# Patient Record
Sex: Female | Born: 1950
Health system: Southern US, Community
[De-identification: ages and names within clinical notes are randomized; demographics above are authoritative.]

## PROBLEM LIST (undated history)

## (undated) DIAGNOSIS — K5730 Diverticulosis of large intestine without perforation or abscess without bleeding: Secondary | ICD-10-CM

## (undated) DIAGNOSIS — N289 Disorder of kidney and ureter, unspecified: Secondary | ICD-10-CM

## (undated) DIAGNOSIS — I890 Lymphedema, not elsewhere classified: Secondary | ICD-10-CM

## (undated) DIAGNOSIS — Z794 Long term (current) use of insulin: Secondary | ICD-10-CM

## (undated) DIAGNOSIS — E785 Hyperlipidemia, unspecified: Secondary | ICD-10-CM

## (undated) DIAGNOSIS — M1990 Unspecified osteoarthritis, unspecified site: Secondary | ICD-10-CM

## (undated) DIAGNOSIS — M25551 Pain in right hip: Secondary | ICD-10-CM

## (undated) DIAGNOSIS — E039 Hypothyroidism, unspecified: Secondary | ICD-10-CM

## (undated) DIAGNOSIS — R3914 Feeling of incomplete bladder emptying: Secondary | ICD-10-CM

## (undated) DIAGNOSIS — F32A Depression, unspecified: Secondary | ICD-10-CM

## (undated) DIAGNOSIS — H3530 Unspecified macular degeneration: Secondary | ICD-10-CM

## (undated) DIAGNOSIS — Z87898 Personal history of other specified conditions: Secondary | ICD-10-CM

## (undated) DIAGNOSIS — Z853 Personal history of malignant neoplasm of breast: Secondary | ICD-10-CM

## (undated) DIAGNOSIS — I10 Essential (primary) hypertension: Secondary | ICD-10-CM

## (undated) DIAGNOSIS — R296 Repeated falls: Secondary | ICD-10-CM

## (undated) DIAGNOSIS — E1142 Type 2 diabetes mellitus with diabetic polyneuropathy: Secondary | ICD-10-CM

## (undated) DIAGNOSIS — E876 Hypokalemia: Secondary | ICD-10-CM

## (undated) DIAGNOSIS — F329 Major depressive disorder, single episode, unspecified: Secondary | ICD-10-CM

## (undated) DIAGNOSIS — H40003 Preglaucoma, unspecified, bilateral: Secondary | ICD-10-CM

## (undated) DIAGNOSIS — R3915 Urgency of urination: Secondary | ICD-10-CM

## (undated) DIAGNOSIS — E119 Type 2 diabetes mellitus without complications: Secondary | ICD-10-CM

## (undated) DIAGNOSIS — N1330 Unspecified hydronephrosis: Secondary | ICD-10-CM

## (undated) DIAGNOSIS — H04123 Dry eye syndrome of bilateral lacrimal glands: Secondary | ICD-10-CM

## (undated) DIAGNOSIS — R42 Dizziness and giddiness: Secondary | ICD-10-CM

## (undated) HISTORY — PX: FINGER SURGERY: SHX640

## (undated) HISTORY — DX: Essential (primary) hypertension: I10

## (undated) HISTORY — PX: TUBAL LIGATION: SHX77

## (undated) HISTORY — DX: Major depressive disorder, single episode, unspecified: F32.9

## (undated) HISTORY — DX: Unspecified osteoarthritis, unspecified site: M19.90

## (undated) HISTORY — DX: Hyperlipidemia, unspecified: E78.5

## (undated) HISTORY — DX: Depression, unspecified: F32.A

## (undated) HISTORY — PX: VAGINAL HYSTERECTOMY: SUR661

---
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
## 1991-04-16 DIAGNOSIS — E1142 Type 2 diabetes mellitus with diabetic polyneuropathy: Secondary | ICD-10-CM | POA: Insufficient documentation

## 1991-04-16 DIAGNOSIS — Z794 Long term (current) use of insulin: Secondary | ICD-10-CM

## 1997-01-28 HISTORY — PX: MASTECTOMY: SHX3

## 1997-05-02 ENCOUNTER — Other Ambulatory Visit: Admission: RE | Admit: 1997-05-02 | Discharge: 1997-05-02 | Payer: Self-pay | Admitting: General Surgery

## 1997-05-16 ENCOUNTER — Other Ambulatory Visit: Admission: RE | Admit: 1997-05-16 | Discharge: 1997-05-16 | Payer: Self-pay | Admitting: General Surgery

## 1997-05-16 ENCOUNTER — Other Ambulatory Visit: Admission: RE | Admit: 1997-05-16 | Discharge: 1997-05-16 | Payer: Self-pay | Admitting: Family Medicine

## 1997-07-29 ENCOUNTER — Ambulatory Visit (HOSPITAL_COMMUNITY): Admission: RE | Admit: 1997-07-29 | Discharge: 1997-07-29 | Payer: Self-pay | Admitting: *Deleted

## 1997-08-22 ENCOUNTER — Ambulatory Visit (HOSPITAL_COMMUNITY): Admission: RE | Admit: 1997-08-22 | Discharge: 1997-08-23 | Payer: Self-pay | Admitting: *Deleted

## 1997-08-29 ENCOUNTER — Ambulatory Visit (HOSPITAL_COMMUNITY): Admission: RE | Admit: 1997-08-29 | Discharge: 1997-08-29 | Payer: Self-pay | Admitting: *Deleted

## 1997-12-06 ENCOUNTER — Encounter: Admission: RE | Admit: 1997-12-06 | Discharge: 1998-03-06 | Payer: Self-pay | Admitting: *Deleted

## 1997-12-16 ENCOUNTER — Encounter: Admission: RE | Admit: 1997-12-16 | Discharge: 1998-02-02 | Payer: Self-pay | Admitting: *Deleted

## 1998-06-25 ENCOUNTER — Emergency Department (HOSPITAL_COMMUNITY): Admission: EM | Admit: 1998-06-25 | Discharge: 1998-06-25 | Payer: Self-pay | Admitting: Emergency Medicine

## 1998-10-05 ENCOUNTER — Ambulatory Visit: Admission: RE | Admit: 1998-10-05 | Discharge: 1998-10-05 | Payer: Self-pay | Admitting: Oncology

## 1999-01-17 ENCOUNTER — Encounter: Payer: Self-pay | Admitting: Oncology

## 1999-01-17 ENCOUNTER — Encounter: Admission: RE | Admit: 1999-01-17 | Discharge: 1999-01-17 | Payer: Self-pay | Admitting: Oncology

## 1999-03-20 ENCOUNTER — Encounter: Admission: RE | Admit: 1999-03-20 | Discharge: 1999-06-18 | Payer: Self-pay | Admitting: *Deleted

## 1999-03-26 ENCOUNTER — Encounter: Admission: RE | Admit: 1999-03-26 | Discharge: 1999-03-26 | Payer: Self-pay | Admitting: *Deleted

## 1999-03-27 ENCOUNTER — Encounter: Admission: RE | Admit: 1999-03-27 | Discharge: 1999-06-25 | Payer: Self-pay | Admitting: *Deleted

## 1999-04-24 ENCOUNTER — Emergency Department (HOSPITAL_COMMUNITY): Admission: EM | Admit: 1999-04-24 | Discharge: 1999-04-24 | Payer: Self-pay | Admitting: Emergency Medicine

## 1999-05-01 HISTORY — PX: PORT-A-CATH REMOVAL: SHX5289

## 1999-05-02 ENCOUNTER — Ambulatory Visit (HOSPITAL_BASED_OUTPATIENT_CLINIC_OR_DEPARTMENT_OTHER): Admission: RE | Admit: 1999-05-02 | Discharge: 1999-05-02 | Payer: Self-pay | Admitting: *Deleted

## 1999-05-29 ENCOUNTER — Encounter: Admission: RE | Admit: 1999-05-29 | Discharge: 1999-05-29 | Payer: Self-pay | Admitting: Oncology

## 1999-05-29 ENCOUNTER — Encounter: Payer: Self-pay | Admitting: Oncology

## 1999-07-02 ENCOUNTER — Encounter: Admission: RE | Admit: 1999-07-02 | Discharge: 1999-07-02 | Payer: Self-pay | Admitting: *Deleted

## 1999-08-10 ENCOUNTER — Encounter: Admission: RE | Admit: 1999-08-10 | Discharge: 1999-08-10 | Payer: Self-pay | Admitting: Family Medicine

## 1999-08-10 ENCOUNTER — Encounter: Payer: Self-pay | Admitting: Oncology

## 1999-11-13 ENCOUNTER — Emergency Department (HOSPITAL_COMMUNITY): Admission: EM | Admit: 1999-11-13 | Discharge: 1999-11-13 | Payer: Self-pay | Admitting: Emergency Medicine

## 2000-06-04 ENCOUNTER — Encounter: Payer: Self-pay | Admitting: Oncology

## 2000-06-04 ENCOUNTER — Encounter: Admission: RE | Admit: 2000-06-04 | Discharge: 2000-06-04 | Payer: Self-pay | Admitting: Oncology

## 2000-08-18 ENCOUNTER — Emergency Department (HOSPITAL_COMMUNITY): Admission: EM | Admit: 2000-08-18 | Discharge: 2000-08-18 | Payer: Self-pay | Admitting: Emergency Medicine

## 2000-08-18 ENCOUNTER — Encounter: Payer: Self-pay | Admitting: Emergency Medicine

## 2000-08-20 ENCOUNTER — Emergency Department (HOSPITAL_COMMUNITY): Admission: EM | Admit: 2000-08-20 | Discharge: 2000-08-20 | Payer: Self-pay | Admitting: Emergency Medicine

## 2000-08-20 ENCOUNTER — Encounter: Payer: Self-pay | Admitting: Emergency Medicine

## 2000-09-16 ENCOUNTER — Encounter: Payer: Self-pay | Admitting: Urology

## 2000-09-18 ENCOUNTER — Ambulatory Visit (HOSPITAL_COMMUNITY): Admission: RE | Admit: 2000-09-18 | Discharge: 2000-09-18 | Payer: Self-pay | Admitting: Urology

## 2000-09-18 HISTORY — PX: OTHER SURGICAL HISTORY: SHX169

## 2001-03-03 ENCOUNTER — Ambulatory Visit (HOSPITAL_COMMUNITY): Admission: RE | Admit: 2001-03-03 | Discharge: 2001-03-03 | Payer: Self-pay | Admitting: Oncology

## 2001-03-03 ENCOUNTER — Encounter: Payer: Self-pay | Admitting: Oncology

## 2001-06-08 ENCOUNTER — Encounter: Payer: Self-pay | Admitting: Oncology

## 2001-06-08 ENCOUNTER — Encounter: Admission: RE | Admit: 2001-06-08 | Discharge: 2001-06-08 | Payer: Self-pay | Admitting: Oncology

## 2001-10-06 ENCOUNTER — Other Ambulatory Visit: Admission: RE | Admit: 2001-10-06 | Discharge: 2001-10-06 | Payer: Self-pay | Admitting: Family Medicine

## 2001-10-08 ENCOUNTER — Encounter: Payer: Self-pay | Admitting: Family Medicine

## 2001-10-08 ENCOUNTER — Ambulatory Visit (HOSPITAL_COMMUNITY): Admission: RE | Admit: 2001-10-08 | Discharge: 2001-10-08 | Payer: Self-pay | Admitting: Family Medicine

## 2002-06-14 ENCOUNTER — Encounter: Payer: Self-pay | Admitting: Oncology

## 2002-06-14 ENCOUNTER — Encounter: Admission: RE | Admit: 2002-06-14 | Discharge: 2002-06-14 | Payer: Self-pay | Admitting: Oncology

## 2002-10-12 ENCOUNTER — Encounter: Payer: Self-pay | Admitting: Family Medicine

## 2002-10-12 ENCOUNTER — Ambulatory Visit (HOSPITAL_COMMUNITY): Admission: RE | Admit: 2002-10-12 | Discharge: 2002-10-12 | Payer: Self-pay | Admitting: Family Medicine

## 2002-10-18 ENCOUNTER — Ambulatory Visit (HOSPITAL_COMMUNITY): Admission: RE | Admit: 2002-10-18 | Discharge: 2002-10-18 | Payer: Self-pay | Admitting: Gastroenterology

## 2003-06-15 ENCOUNTER — Encounter: Admission: RE | Admit: 2003-06-15 | Discharge: 2003-06-15 | Payer: Self-pay | Admitting: Oncology

## 2003-10-07 ENCOUNTER — Ambulatory Visit: Payer: Self-pay | Admitting: Family Medicine

## 2003-10-13 DIAGNOSIS — E785 Hyperlipidemia, unspecified: Secondary | ICD-10-CM | POA: Insufficient documentation

## 2003-11-01 ENCOUNTER — Ambulatory Visit (HOSPITAL_COMMUNITY): Admission: RE | Admit: 2003-11-01 | Discharge: 2003-11-01 | Payer: Self-pay | Admitting: Family Medicine

## 2003-11-28 ENCOUNTER — Ambulatory Visit: Payer: Self-pay | Admitting: Family Medicine

## 2004-01-06 ENCOUNTER — Ambulatory Visit: Payer: Self-pay | Admitting: Family Medicine

## 2004-01-18 ENCOUNTER — Ambulatory Visit: Payer: Self-pay | Admitting: Family Medicine

## 2004-02-23 ENCOUNTER — Ambulatory Visit: Payer: Self-pay | Admitting: Family Medicine

## 2004-04-25 ENCOUNTER — Ambulatory Visit: Payer: Self-pay | Admitting: Internal Medicine

## 2004-04-27 ENCOUNTER — Ambulatory Visit: Payer: Self-pay | Admitting: Family Medicine

## 2004-05-15 ENCOUNTER — Ambulatory Visit (HOSPITAL_COMMUNITY): Admission: RE | Admit: 2004-05-15 | Discharge: 2004-05-15 | Payer: Self-pay | Admitting: Family Medicine

## 2004-06-18 ENCOUNTER — Ambulatory Visit: Payer: Self-pay | Admitting: Oncology

## 2004-06-28 ENCOUNTER — Encounter (INDEPENDENT_AMBULATORY_CARE_PROVIDER_SITE_OTHER): Payer: Self-pay | Admitting: Family Medicine

## 2004-06-28 LAB — CONVERTED CEMR LAB: Pap Smear: NORMAL

## 2004-07-04 ENCOUNTER — Encounter: Admission: RE | Admit: 2004-07-04 | Discharge: 2004-07-04 | Payer: Self-pay | Admitting: Oncology

## 2004-07-12 ENCOUNTER — Encounter: Admission: RE | Admit: 2004-07-12 | Discharge: 2004-07-12 | Payer: Self-pay | Admitting: Oncology

## 2004-08-01 ENCOUNTER — Ambulatory Visit: Payer: Self-pay | Admitting: Family Medicine

## 2004-10-03 ENCOUNTER — Ambulatory Visit: Payer: Self-pay | Admitting: Family Medicine

## 2004-12-07 ENCOUNTER — Ambulatory Visit: Payer: Self-pay | Admitting: Family Medicine

## 2005-02-12 ENCOUNTER — Ambulatory Visit: Payer: Self-pay | Admitting: Family Medicine

## 2005-04-08 ENCOUNTER — Ambulatory Visit: Payer: Self-pay | Admitting: Family Medicine

## 2005-07-12 ENCOUNTER — Encounter: Admission: RE | Admit: 2005-07-12 | Discharge: 2005-07-12 | Payer: Self-pay | Admitting: Oncology

## 2005-07-16 ENCOUNTER — Ambulatory Visit: Payer: Self-pay | Admitting: Oncology

## 2005-07-19 LAB — COMPREHENSIVE METABOLIC PANEL
ALT: 9 U/L (ref 0–40)
AST: 12 U/L (ref 0–37)
Albumin: 4.5 g/dL (ref 3.5–5.2)
Alkaline Phosphatase: 71 U/L (ref 39–117)
BUN: 25 mg/dL — ABNORMAL HIGH (ref 6–23)
CO2: 23 mEq/L (ref 19–32)
Calcium: 9.2 mg/dL (ref 8.4–10.5)
Chloride: 101 mEq/L (ref 96–112)
Creatinine, Ser: 1.14 mg/dL (ref 0.40–1.20)
Glucose, Bld: 199 mg/dL — ABNORMAL HIGH (ref 70–99)
Potassium: 3.1 mEq/L — ABNORMAL LOW (ref 3.5–5.3)
Sodium: 139 mEq/L (ref 135–145)
Total Bilirubin: 0.4 mg/dL (ref 0.3–1.2)
Total Protein: 7.3 g/dL (ref 6.0–8.3)

## 2005-07-19 LAB — CBC WITH DIFFERENTIAL/PLATELET
BASO%: 0.3 % (ref 0.0–2.0)
Basophils Absolute: 0 10*3/uL (ref 0.0–0.1)
EOS%: 1.3 % (ref 0.0–7.0)
Eosinophils Absolute: 0.1 10*3/uL (ref 0.0–0.5)
HCT: 34.6 % — ABNORMAL LOW (ref 34.8–46.6)
HGB: 11.7 g/dL (ref 11.6–15.9)
LYMPH%: 15.6 % (ref 14.0–48.0)
MCH: 30.9 pg (ref 26.0–34.0)
MCHC: 33.8 g/dL (ref 32.0–36.0)
MCV: 91.4 fL (ref 81.0–101.0)
MONO#: 0.4 10*3/uL (ref 0.1–0.9)
MONO%: 5 % (ref 0.0–13.0)
NEUT#: 6.8 10*3/uL — ABNORMAL HIGH (ref 1.5–6.5)
NEUT%: 77.8 % — ABNORMAL HIGH (ref 39.6–76.8)
Platelets: 291 10*3/uL (ref 145–400)
RBC: 3.79 10*6/uL (ref 3.70–5.32)
RDW: 12.5 % (ref 11.3–14.5)
WBC: 8.7 10*3/uL (ref 3.9–10.0)
lymph#: 1.4 10*3/uL (ref 0.9–3.3)

## 2005-07-19 LAB — LACTATE DEHYDROGENASE: LDH: 127 U/L (ref 94–250)

## 2005-07-23 ENCOUNTER — Ambulatory Visit: Payer: Self-pay | Admitting: Family Medicine

## 2005-08-09 LAB — BASIC METABOLIC PANEL
BUN: 20 mg/dL (ref 6–23)
CO2: 26 mEq/L (ref 19–32)
Calcium: 9.3 mg/dL (ref 8.4–10.5)
Chloride: 95 mEq/L — ABNORMAL LOW (ref 96–112)
Creatinine, Ser: 1.03 mg/dL (ref 0.40–1.20)
Glucose, Bld: 318 mg/dL — ABNORMAL HIGH (ref 70–99)
Potassium: 3.3 mEq/L — ABNORMAL LOW (ref 3.5–5.3)
Sodium: 135 mEq/L (ref 135–145)

## 2005-10-22 ENCOUNTER — Ambulatory Visit: Payer: Self-pay | Admitting: Family Medicine

## 2005-12-30 ENCOUNTER — Ambulatory Visit: Payer: Self-pay | Admitting: Internal Medicine

## 2006-01-23 ENCOUNTER — Ambulatory Visit: Payer: Self-pay | Admitting: Family Medicine

## 2006-04-16 ENCOUNTER — Encounter (INDEPENDENT_AMBULATORY_CARE_PROVIDER_SITE_OTHER): Payer: Self-pay | Admitting: Family Medicine

## 2006-04-16 DIAGNOSIS — M545 Low back pain, unspecified: Secondary | ICD-10-CM | POA: Insufficient documentation

## 2006-04-16 DIAGNOSIS — E039 Hypothyroidism, unspecified: Secondary | ICD-10-CM | POA: Insufficient documentation

## 2006-04-16 DIAGNOSIS — M1990 Unspecified osteoarthritis, unspecified site: Secondary | ICD-10-CM | POA: Insufficient documentation

## 2006-04-16 DIAGNOSIS — Z853 Personal history of malignant neoplasm of breast: Secondary | ICD-10-CM | POA: Insufficient documentation

## 2006-04-16 DIAGNOSIS — F32A Depression, unspecified: Secondary | ICD-10-CM | POA: Insufficient documentation

## 2006-04-16 DIAGNOSIS — F329 Major depressive disorder, single episode, unspecified: Secondary | ICD-10-CM | POA: Insufficient documentation

## 2006-04-16 DIAGNOSIS — D649 Anemia, unspecified: Secondary | ICD-10-CM | POA: Insufficient documentation

## 2006-04-16 HISTORY — DX: Low back pain, unspecified: M54.50

## 2006-04-24 ENCOUNTER — Ambulatory Visit: Payer: Self-pay | Admitting: Family Medicine

## 2006-05-21 ENCOUNTER — Ambulatory Visit: Payer: Self-pay | Admitting: Family Medicine

## 2006-05-27 ENCOUNTER — Emergency Department (HOSPITAL_COMMUNITY): Admission: EM | Admit: 2006-05-27 | Discharge: 2006-05-27 | Payer: Self-pay | Admitting: Emergency Medicine

## 2006-06-10 ENCOUNTER — Ambulatory Visit: Payer: Self-pay | Admitting: Family Medicine

## 2006-07-09 ENCOUNTER — Ambulatory Visit: Payer: Self-pay | Admitting: Oncology

## 2006-07-11 LAB — CBC WITH DIFFERENTIAL/PLATELET
BASO%: 0.7 % (ref 0.0–2.0)
Basophils Absolute: 0.1 10*3/uL (ref 0.0–0.1)
EOS%: 6.1 % (ref 0.0–7.0)
Eosinophils Absolute: 0.4 10*3/uL (ref 0.0–0.5)
HCT: 31.1 % — ABNORMAL LOW (ref 34.8–46.6)
HGB: 11.2 g/dL — ABNORMAL LOW (ref 11.6–15.9)
LYMPH%: 17.6 % (ref 14.0–48.0)
MCH: 31.5 pg (ref 26.0–34.0)
MCHC: 36.1 g/dL — ABNORMAL HIGH (ref 32.0–36.0)
MCV: 87.4 fL (ref 81.0–101.0)
MONO#: 0.6 10*3/uL (ref 0.1–0.9)
MONO%: 7.7 % (ref 0.0–13.0)
NEUT#: 5 10*3/uL (ref 1.5–6.5)
NEUT%: 67.8 % (ref 39.6–76.8)
Platelets: 282 10*3/uL (ref 145–400)
RBC: 3.55 10*6/uL — ABNORMAL LOW (ref 3.70–5.32)
RDW: 10.7 % — ABNORMAL LOW (ref 11.3–14.5)
WBC: 7.3 10*3/uL (ref 3.9–10.0)
lymph#: 1.3 10*3/uL (ref 0.9–3.3)

## 2006-07-11 LAB — COMPREHENSIVE METABOLIC PANEL
ALT: 9 U/L (ref 0–35)
AST: 15 U/L (ref 0–37)
Albumin: 4.2 g/dL (ref 3.5–5.2)
Alkaline Phosphatase: 66 U/L (ref 39–117)
BUN: 20 mg/dL (ref 6–23)
CO2: 23 mEq/L (ref 19–32)
Calcium: 9 mg/dL (ref 8.4–10.5)
Chloride: 105 mEq/L (ref 96–112)
Creatinine, Ser: 1.51 mg/dL — ABNORMAL HIGH (ref 0.40–1.20)
Glucose, Bld: 167 mg/dL — ABNORMAL HIGH (ref 70–99)
Potassium: 3.7 mEq/L (ref 3.5–5.3)
Sodium: 139 mEq/L (ref 135–145)
Total Bilirubin: 0.3 mg/dL (ref 0.3–1.2)
Total Protein: 7 g/dL (ref 6.0–8.3)

## 2006-07-11 LAB — LACTATE DEHYDROGENASE: LDH: 132 U/L (ref 94–250)

## 2006-07-14 ENCOUNTER — Encounter: Admission: RE | Admit: 2006-07-14 | Discharge: 2006-07-14 | Payer: Self-pay | Admitting: Oncology

## 2006-07-25 ENCOUNTER — Ambulatory Visit: Payer: Self-pay | Admitting: Family Medicine

## 2006-10-08 ENCOUNTER — Observation Stay (HOSPITAL_COMMUNITY): Admission: EM | Admit: 2006-10-08 | Discharge: 2006-10-09 | Payer: Self-pay | Admitting: Emergency Medicine

## 2006-11-07 ENCOUNTER — Ambulatory Visit: Payer: Self-pay | Admitting: Family Medicine

## 2006-12-16 ENCOUNTER — Ambulatory Visit: Payer: Self-pay | Admitting: Family Medicine

## 2006-12-16 LAB — CONVERTED CEMR LAB
ALT: 11 units/L (ref 0–35)
AST: 20 units/L (ref 0–37)
Albumin: 4.3 g/dL (ref 3.5–5.2)
Alkaline Phosphatase: 64 units/L (ref 39–117)
BUN: 28 mg/dL — ABNORMAL HIGH (ref 6–23)
CO2: 21 meq/L (ref 19–32)
Calcium: 9.2 mg/dL (ref 8.4–10.5)
Chloride: 105 meq/L (ref 96–112)
Cholesterol: 135 mg/dL (ref 0–200)
Creatinine, Ser: 1.34 mg/dL — ABNORMAL HIGH (ref 0.40–1.20)
Glucose, Bld: 87 mg/dL (ref 70–99)
HDL: 31 mg/dL — ABNORMAL LOW (ref >39)
LDL Cholesterol: 87 mg/dL (ref 0–99)
Potassium: 3.6 meq/L (ref 3.5–5.3)
Sodium: 142 meq/L (ref 135–145)
TSH: 0.164 microintl units/mL — ABNORMAL LOW (ref 0.350–5.50)
Total Bilirubin: 0.4 mg/dL (ref 0.3–1.2)
Total CHOL/HDL Ratio: 4.4
Total Protein: 7.1 g/dL (ref 6.0–8.3)
Triglycerides: 85 mg/dL (ref <150)
VLDL: 17 mg/dL (ref 0–40)

## 2007-01-07 ENCOUNTER — Ambulatory Visit: Payer: Self-pay | Admitting: Family Medicine

## 2007-02-18 ENCOUNTER — Ambulatory Visit: Payer: Self-pay | Admitting: Family Medicine

## 2007-02-18 LAB — CONVERTED CEMR LAB
Free T4: 0.92 ng/dL (ref 0.89–1.80)
TSH: 11.429 microintl units/mL — ABNORMAL HIGH (ref 0.350–5.50)

## 2007-03-19 ENCOUNTER — Ambulatory Visit: Payer: Self-pay | Admitting: Family Medicine

## 2007-04-20 ENCOUNTER — Ambulatory Visit: Payer: Self-pay | Admitting: Family Medicine

## 2007-04-20 LAB — CONVERTED CEMR LAB
BUN: 25 mg/dL — ABNORMAL HIGH (ref 6–23)
CO2: 23 meq/L (ref 19–32)
Calcium: 9.9 mg/dL (ref 8.4–10.5)
Chloride: 100 meq/L (ref 96–112)
Creatinine, Ser: 1.16 mg/dL (ref 0.40–1.20)
Free T4: 0.96 ng/dL (ref 0.89–1.80)
Glucose, Bld: 354 mg/dL — ABNORMAL HIGH (ref 70–99)
Potassium: 3.9 meq/L (ref 3.5–5.3)
Sodium: 139 meq/L (ref 135–145)
TSH: 6.342 microintl units/mL — ABNORMAL HIGH (ref 0.350–5.50)

## 2007-06-16 ENCOUNTER — Ambulatory Visit: Payer: Self-pay | Admitting: Family Medicine

## 2007-06-16 LAB — CONVERTED CEMR LAB: Microalb, Ur: 2.22 mg/dL — ABNORMAL HIGH (ref 0.00–1.89)

## 2007-07-08 ENCOUNTER — Ambulatory Visit: Payer: Self-pay | Admitting: Oncology

## 2007-07-13 LAB — COMPREHENSIVE METABOLIC PANEL
ALT: 9 U/L (ref 0–35)
AST: 12 U/L (ref 0–37)
Albumin: 4.1 g/dL (ref 3.5–5.2)
Alkaline Phosphatase: 62 U/L (ref 39–117)
BUN: 17 mg/dL (ref 6–23)
CO2: 22 mEq/L (ref 19–32)
Calcium: 10 mg/dL (ref 8.4–10.5)
Chloride: 105 mEq/L (ref 96–112)
Creatinine, Ser: 1.21 mg/dL — ABNORMAL HIGH (ref 0.40–1.20)
Glucose, Bld: 240 mg/dL — ABNORMAL HIGH (ref 70–99)
Potassium: 3.5 mEq/L (ref 3.5–5.3)
Sodium: 141 mEq/L (ref 135–145)
Total Bilirubin: 0.3 mg/dL (ref 0.3–1.2)
Total Protein: 7 g/dL (ref 6.0–8.3)

## 2007-07-13 LAB — CBC WITH DIFFERENTIAL/PLATELET
BASO%: 0.4 % (ref 0.0–2.0)
Basophils Absolute: 0 10*3/uL (ref 0.0–0.1)
EOS%: 2.2 % (ref 0.0–7.0)
Eosinophils Absolute: 0.2 10*3/uL (ref 0.0–0.5)
HCT: 35.2 % (ref 34.8–46.6)
HGB: 12 g/dL (ref 11.6–15.9)
LYMPH%: 18.7 % (ref 14.0–48.0)
MCH: 30.9 pg (ref 26.0–34.0)
MCHC: 34.1 g/dL (ref 32.0–36.0)
MCV: 90.5 fL (ref 81.0–101.0)
MONO#: 0.5 10*3/uL (ref 0.1–0.9)
MONO%: 5.1 % (ref 0.0–13.0)
NEUT#: 6.7 10*3/uL — ABNORMAL HIGH (ref 1.5–6.5)
NEUT%: 73.6 % (ref 39.6–76.8)
Platelets: 276 10*3/uL (ref 145–400)
RBC: 3.89 10*6/uL (ref 3.70–5.32)
RDW: 13.1 % (ref 11.3–14.5)
WBC: 9.1 10*3/uL (ref 3.9–10.0)
lymph#: 1.7 10*3/uL (ref 0.9–3.3)

## 2007-07-13 LAB — LACTATE DEHYDROGENASE: LDH: 108 U/L (ref 94–250)

## 2007-07-16 ENCOUNTER — Encounter: Admission: RE | Admit: 2007-07-16 | Discharge: 2007-07-16 | Payer: Self-pay | Admitting: Oncology

## 2007-07-24 ENCOUNTER — Ambulatory Visit (HOSPITAL_COMMUNITY): Admission: RE | Admit: 2007-07-24 | Discharge: 2007-07-24 | Payer: Self-pay | Admitting: Oncology

## 2007-08-04 ENCOUNTER — Ambulatory Visit (HOSPITAL_COMMUNITY): Admission: RE | Admit: 2007-08-04 | Discharge: 2007-08-04 | Payer: Self-pay | Admitting: Oncology

## 2007-08-05 ENCOUNTER — Encounter (INDEPENDENT_AMBULATORY_CARE_PROVIDER_SITE_OTHER): Payer: Self-pay | Admitting: Family Medicine

## 2007-08-05 ENCOUNTER — Ambulatory Visit: Payer: Self-pay | Admitting: Internal Medicine

## 2007-08-05 LAB — CONVERTED CEMR LAB: TSH: 3.97 microintl units/mL (ref 0.350–4.50)

## 2007-08-14 ENCOUNTER — Ambulatory Visit: Payer: Self-pay | Admitting: Oncology

## 2007-09-22 ENCOUNTER — Ambulatory Visit: Payer: Self-pay | Admitting: Internal Medicine

## 2007-09-23 ENCOUNTER — Encounter (INDEPENDENT_AMBULATORY_CARE_PROVIDER_SITE_OTHER): Payer: Self-pay | Admitting: Family Medicine

## 2007-09-23 LAB — CONVERTED CEMR LAB
BUN: 25 mg/dL — ABNORMAL HIGH (ref 6–23)
Basophils Absolute: 0 10*3/uL (ref 0.0–0.1)
Basophils Relative: 0 % (ref 0–1)
CO2: 22 meq/L (ref 19–32)
Calcium: 10 mg/dL (ref 8.4–10.5)
Chloride: 105 meq/L (ref 96–112)
Cholesterol: 143 mg/dL (ref 0–200)
Creatinine, Ser: 1.27 mg/dL — ABNORMAL HIGH (ref 0.40–1.20)
Eosinophils Absolute: 0.2 10*3/uL (ref 0.0–0.7)
Eosinophils Relative: 2 % (ref 0–5)
Glucose, Bld: 162 mg/dL — ABNORMAL HIGH (ref 70–99)
HCT: 35.8 % — ABNORMAL LOW (ref 36.0–46.0)
HDL: 32 mg/dL — ABNORMAL LOW (ref >39)
Hemoglobin: 12.1 g/dL (ref 12.0–15.0)
LDL Cholesterol: 69 mg/dL (ref 0–99)
Lymphocytes Relative: 22 % (ref 12–46)
Lymphs Abs: 1.9 10*3/uL (ref 0.7–4.0)
MCHC: 33.8 g/dL (ref 30.0–36.0)
MCV: 88.8 fL (ref 78.0–100.0)
Monocytes Absolute: 0.4 10*3/uL (ref 0.1–1.0)
Monocytes Relative: 5 % (ref 3–12)
Neutro Abs: 6.3 10*3/uL (ref 1.7–7.7)
Neutrophils Relative %: 72 % (ref 43–77)
Platelets: 272 10*3/uL (ref 150–400)
Potassium: 3.5 meq/L (ref 3.5–5.3)
RBC: 4.03 M/uL (ref 3.87–5.11)
RDW: 12.9 % (ref 11.5–15.5)
Sodium: 139 meq/L (ref 135–145)
Total CHOL/HDL Ratio: 4.5
Triglycerides: 211 mg/dL — ABNORMAL HIGH (ref <150)
VLDL: 42 mg/dL — ABNORMAL HIGH (ref 0–40)
Vit D, 1,25-Dihydroxy: 21 — ABNORMAL LOW (ref 30–89)
WBC: 8.8 10*3/uL (ref 4.0–10.5)

## 2007-11-03 ENCOUNTER — Encounter (HOSPITAL_COMMUNITY): Admission: RE | Admit: 2007-11-03 | Discharge: 2008-01-28 | Payer: Self-pay | Admitting: Oncology

## 2007-12-18 ENCOUNTER — Ambulatory Visit: Payer: Self-pay | Admitting: Oncology

## 2008-01-14 ENCOUNTER — Ambulatory Visit: Payer: Self-pay | Admitting: Family Medicine

## 2008-02-25 ENCOUNTER — Ambulatory Visit: Payer: Self-pay | Admitting: Family Medicine

## 2008-03-21 ENCOUNTER — Ambulatory Visit: Payer: Self-pay | Admitting: Internal Medicine

## 2008-04-28 ENCOUNTER — Ambulatory Visit: Payer: Self-pay | Admitting: Family Medicine

## 2008-05-06 ENCOUNTER — Ambulatory Visit (HOSPITAL_COMMUNITY): Admission: RE | Admit: 2008-05-06 | Discharge: 2008-05-06 | Payer: Self-pay | Admitting: Family Medicine

## 2008-07-14 ENCOUNTER — Ambulatory Visit: Payer: Self-pay | Admitting: Oncology

## 2008-07-18 ENCOUNTER — Encounter: Admission: RE | Admit: 2008-07-18 | Discharge: 2008-07-18 | Payer: Self-pay | Admitting: Oncology

## 2008-07-18 LAB — COMPREHENSIVE METABOLIC PANEL
ALT: 8 U/L (ref 0–35)
AST: 12 U/L (ref 0–37)
Albumin: 4 g/dL (ref 3.5–5.2)
Alkaline Phosphatase: 61 U/L (ref 39–117)
BUN: 25 mg/dL — ABNORMAL HIGH (ref 6–23)
CO2: 22 mEq/L (ref 19–32)
Calcium: 10.1 mg/dL (ref 8.4–10.5)
Chloride: 102 mEq/L (ref 96–112)
Creatinine, Ser: 1.3 mg/dL — ABNORMAL HIGH (ref 0.40–1.20)
Glucose, Bld: 311 mg/dL — ABNORMAL HIGH (ref 70–99)
Potassium: 3.7 mEq/L (ref 3.5–5.3)
Sodium: 137 mEq/L (ref 135–145)
Total Bilirubin: 0.3 mg/dL (ref 0.3–1.2)
Total Protein: 7.1 g/dL (ref 6.0–8.3)

## 2008-07-18 LAB — CBC WITH DIFFERENTIAL/PLATELET
BASO%: 0.3 % (ref 0.0–2.0)
Basophils Absolute: 0 10*3/uL (ref 0.0–0.1)
EOS%: 1.5 % (ref 0.0–7.0)
Eosinophils Absolute: 0.1 10*3/uL (ref 0.0–0.5)
HCT: 34.3 % — ABNORMAL LOW (ref 34.8–46.6)
HGB: 12 g/dL (ref 11.6–15.9)
LYMPH%: 20.7 % (ref 14.0–49.7)
MCH: 32 pg (ref 25.1–34.0)
MCHC: 34.9 g/dL (ref 31.5–36.0)
MCV: 91.7 fL (ref 79.5–101.0)
MONO#: 0.5 10*3/uL (ref 0.1–0.9)
MONO%: 5.4 % (ref 0.0–14.0)
NEUT#: 6.1 10*3/uL (ref 1.5–6.5)
NEUT%: 72.1 % (ref 38.4–76.8)
Platelets: 233 10*3/uL (ref 145–400)
RBC: 3.74 10*6/uL (ref 3.70–5.45)
RDW: 12.6 % (ref 11.2–14.5)
WBC: 8.4 10*3/uL (ref 3.9–10.3)
lymph#: 1.7 10*3/uL (ref 0.9–3.3)

## 2008-07-18 LAB — LACTATE DEHYDROGENASE: LDH: 102 U/L (ref 94–250)

## 2008-08-04 ENCOUNTER — Ambulatory Visit: Payer: Self-pay | Admitting: Family Medicine

## 2008-08-04 LAB — CONVERTED CEMR LAB: Microalb, Ur: 1.13 mg/dL (ref 0.00–1.89)

## 2008-08-10 ENCOUNTER — Encounter: Admission: RE | Admit: 2008-08-10 | Discharge: 2008-09-14 | Payer: Self-pay | Admitting: Oncology

## 2008-08-15 ENCOUNTER — Ambulatory Visit: Payer: Self-pay | Admitting: Psychiatry

## 2008-08-22 ENCOUNTER — Ambulatory Visit: Payer: Self-pay | Admitting: Oncology

## 2008-08-29 ENCOUNTER — Ambulatory Visit: Payer: Self-pay | Admitting: Psychiatry

## 2008-10-06 ENCOUNTER — Ambulatory Visit: Payer: Self-pay | Admitting: Family Medicine

## 2009-01-03 ENCOUNTER — Ambulatory Visit: Payer: Self-pay | Admitting: Family Medicine

## 2009-01-03 LAB — CONVERTED CEMR LAB
ALT: 8 units/L (ref 0–35)
AST: 12 units/L (ref 0–37)
Albumin: 4.4 g/dL (ref 3.5–5.2)
Alkaline Phosphatase: 51 units/L (ref 39–117)
BUN: 17 mg/dL (ref 6–23)
CO2: 18 meq/L — ABNORMAL LOW (ref 19–32)
Calcium: 9.5 mg/dL (ref 8.4–10.5)
Chloride: 107 meq/L (ref 96–112)
Cholesterol: 148 mg/dL (ref 0–200)
Creatinine, Ser: 1.15 mg/dL (ref 0.40–1.20)
Glucose, Bld: 146 mg/dL — ABNORMAL HIGH (ref 70–99)
HDL: 36 mg/dL — ABNORMAL LOW (ref >39)
LDL Cholesterol: 83 mg/dL (ref 0–99)
Potassium: 3.9 meq/L (ref 3.5–5.3)
Sodium: 142 meq/L (ref 135–145)
Total Bilirubin: 0.3 mg/dL (ref 0.3–1.2)
Total CHOL/HDL Ratio: 4.1
Total Protein: 7 g/dL (ref 6.0–8.3)
Triglycerides: 147 mg/dL (ref <150)
VLDL: 29 mg/dL (ref 0–40)
Vit D, 25-Hydroxy: 25 ng/mL — ABNORMAL LOW (ref 30–89)

## 2009-02-22 ENCOUNTER — Ambulatory Visit: Payer: Self-pay | Admitting: Family Medicine

## 2009-02-22 LAB — CONVERTED CEMR LAB: Hgb A1c MFr Bld: 12.3 % — ABNORMAL HIGH (ref 4.6–6.1)

## 2009-02-24 ENCOUNTER — Ambulatory Visit: Payer: Self-pay | Admitting: Family Medicine

## 2009-04-27 ENCOUNTER — Ambulatory Visit: Payer: Self-pay | Admitting: Family Medicine

## 2009-07-19 ENCOUNTER — Encounter: Admission: RE | Admit: 2009-07-19 | Discharge: 2009-07-19 | Payer: Self-pay | Admitting: Oncology

## 2009-07-25 ENCOUNTER — Ambulatory Visit: Payer: Self-pay | Admitting: Oncology

## 2009-07-25 LAB — CBC WITH DIFFERENTIAL/PLATELET
BASO%: 0.3 % (ref 0.0–2.0)
Basophils Absolute: 0 10*3/uL (ref 0.0–0.1)
EOS%: 1.2 % (ref 0.0–7.0)
Eosinophils Absolute: 0.1 10*3/uL (ref 0.0–0.5)
HCT: 34.2 % — ABNORMAL LOW (ref 34.8–46.6)
HGB: 11.7 g/dL (ref 11.6–15.9)
LYMPH%: 21.5 % (ref 14.0–49.7)
MCH: 31.5 pg (ref 25.1–34.0)
MCHC: 34.1 g/dL (ref 31.5–36.0)
MCV: 92.2 fL (ref 79.5–101.0)
MONO#: 0.4 10*3/uL (ref 0.1–0.9)
MONO%: 4.2 % (ref 0.0–14.0)
NEUT#: 6.1 10*3/uL (ref 1.5–6.5)
NEUT%: 72.8 % (ref 38.4–76.8)
Platelets: 245 10*3/uL (ref 145–400)
RBC: 3.71 10*6/uL (ref 3.70–5.45)
RDW: 13.1 % (ref 11.2–14.5)
WBC: 8.4 10*3/uL (ref 3.9–10.3)
lymph#: 1.8 10*3/uL (ref 0.9–3.3)

## 2009-07-25 LAB — COMPREHENSIVE METABOLIC PANEL
ALT: 12 U/L (ref 0–35)
AST: 20 U/L (ref 0–37)
Albumin: 4.3 g/dL (ref 3.5–5.2)
Alkaline Phosphatase: 67 U/L (ref 39–117)
BUN: 22 mg/dL (ref 6–23)
CO2: 20 mEq/L (ref 19–32)
Calcium: 10.5 mg/dL (ref 8.4–10.5)
Chloride: 104 mEq/L (ref 96–112)
Creatinine, Ser: 1.31 mg/dL — ABNORMAL HIGH (ref 0.40–1.20)
Glucose, Bld: 638 mg/dL (ref 70–99)
Potassium: 3.8 mEq/L (ref 3.5–5.3)
Sodium: 137 mEq/L (ref 135–145)
Total Bilirubin: 0.4 mg/dL (ref 0.3–1.2)
Total Protein: 7.3 g/dL (ref 6.0–8.3)

## 2009-07-25 LAB — LACTATE DEHYDROGENASE: LDH: 139 U/L (ref 94–250)

## 2009-07-27 ENCOUNTER — Other Ambulatory Visit: Admission: RE | Admit: 2009-07-27 | Discharge: 2009-07-27 | Payer: Self-pay | Admitting: Internal Medicine

## 2009-07-27 ENCOUNTER — Ambulatory Visit: Payer: Self-pay | Admitting: Family Medicine

## 2009-08-01 LAB — BASIC METABOLIC PANEL
BUN: 17 mg/dL (ref 6–23)
CO2: 24 mEq/L (ref 19–32)
Calcium: 9.8 mg/dL (ref 8.4–10.5)
Chloride: 105 mEq/L (ref 96–112)
Creatinine, Ser: 1.1 mg/dL (ref 0.40–1.20)
Glucose, Bld: 227 mg/dL — ABNORMAL HIGH (ref 70–99)
Potassium: 3.8 mEq/L (ref 3.5–5.3)
Sodium: 141 mEq/L (ref 135–145)

## 2009-08-09 ENCOUNTER — Ambulatory Visit (HOSPITAL_COMMUNITY): Admission: RE | Admit: 2009-08-09 | Discharge: 2009-08-09 | Payer: Self-pay | Admitting: Oncology

## 2010-02-01 ENCOUNTER — Encounter (INDEPENDENT_AMBULATORY_CARE_PROVIDER_SITE_OTHER): Payer: Self-pay | Admitting: Family Medicine

## 2010-02-01 LAB — CONVERTED CEMR LAB
ALT: 8 units/L (ref 0–35)
AST: 18 units/L (ref 0–37)
Albumin: 4 g/dL (ref 3.5–5.2)
Alkaline Phosphatase: 58 units/L (ref 39–117)
BUN: 22 mg/dL (ref 6–23)
CO2: 21 meq/L (ref 19–32)
Calcium: 9.2 mg/dL (ref 8.4–10.5)
Chloride: 103 meq/L (ref 96–112)
Cholesterol: 158 mg/dL (ref 0–200)
Creatinine, Ser: 1.2 mg/dL (ref 0.40–1.20)
Glucose, Bld: 203 mg/dL — ABNORMAL HIGH (ref 70–99)
HDL: 34 mg/dL — ABNORMAL LOW (ref >39)
LDL Cholesterol: 98 mg/dL (ref 0–99)
Potassium: 3.5 meq/L (ref 3.5–5.3)
Sodium: 139 meq/L (ref 135–145)
Total Bilirubin: 0.3 mg/dL (ref 0.3–1.2)
Total CHOL/HDL Ratio: 4.6
Total Protein: 6.7 g/dL (ref 6.0–8.3)
Triglycerides: 131 mg/dL (ref <150)
VLDL: 26 mg/dL (ref 0–40)

## 2010-02-17 ENCOUNTER — Other Ambulatory Visit: Payer: Self-pay | Admitting: Oncology

## 2010-02-17 DIAGNOSIS — Z1239 Encounter for other screening for malignant neoplasm of breast: Secondary | ICD-10-CM

## 2010-06-12 NOTE — Consult Note (Signed)
NAMENIKCOLE, EISCHEID NO.:  [id number]   MEDICAL RECORD NO.:  [id number]          PATIENT TYPE:  INP   LOCATION:  1224                         FACILITY:  [facility]   PHYSICIAN:  Genene Churn. Love, M.D.    DATE OF BIRTH:  16-Jul-1950   DATE OF CONSULTATION:  DATE OF DISCHARGE:                                 CONSULTATION   REASON FOR CONSULTATION:  This 60 year old, right-handed, black,  divorced female is seen for evaluation of leg pain.   HISTORY OF PRESENT ILLNESS:  Ms. Pyeatt has a 1-1/2-year history of lower  back pain which is aggravated by being in the sitting position.  This  usually occurs after she has been in the sitting position for some time.  She has also had a 1-1/2-year history of bilateral leg pain which does  not always occur in both legs at the same time.  It usually occurs after  she has been walking and notes that when she sits down the pain is  relieved.  The pain is made worse by walking uphill than it is by  walking on a flat surface.  She has also noticed pain in her back with  bending following.  She denies any bowel or bladder dysfunction.  She  has a known history of diabetes mellitus and has had a many year history  of numbness in her toes, feet and in her hands bilaterally.  She has no  history of trauma to her head, neck or back.   PAST MEDICAL HISTORY:  She does have a known past history of glaucoma,  breast cancer with right mastectomy and is status post radiation therapy  and chemotherapy in approximately 1999.  She has had hypertension,  depression and hypothyroidism.   SOCIAL HISTORY:  She is a nonsmoker and does not drink alcohol.   ALLERGIES:  She has a history of allergy to PENICILLIN.   MEDICATIONS AT THE TIME OF ADMISSION:  1. Metformin 1,000 mg twice a day.  2. Effexor XR 75 mg at bedtime.  3. Ibuprofen 800 mg every 8-12 hours as needed.  4. Lantus 30 units subcutaneously at bedtime.  5. NovoLog SSI subcutaneously after  dinner only.  6. Levothroid 88 mcg daily.  7. Prilosec 20 mg twice daily.  8. Pravastatin 20 mg daily.  9. Triamterene/HCTZ 37.5/25 daily.  10.Xalatan eye drops 0.005% one drop in right eye at bedtime.  11.Diflucan one tablet as needed.  12.Aleve one tablet as needed.   PHYSICAL EXAMINATION:  GENERAL APPEARANCE:  A well-developed female.  VITAL SIGNS:  Blood pressure in the right and left arm 130/81 and  120/80.  Heart rate 64.  No bruits heard.  NECK:  Flexion and extension maneuvers are unremarkable.  HEENT:  Mouth is in good repair.  MENTAL STATUS:  Alert and oriented x3.  Following two and three-step  commands.  CRANIAL NERVE EXAMINATION:  Visual fields are full.  Disks are flat.  Spontaneous venous pulsations seen.  Extraocular movements are full.  Corneals present.  Facial sensation is equal.  No facial motor  asymmetry.  Hearing is decreased  to air conduction greater than bone  conduction.  Tongue is midline.  Uvula is midline.  Gag is present.  Sternocleidomastoid and trapezius testing are normal.  MOTOR EXAMINATION:  She has 5/5 strength proximally and distally in the  upper and lower extremities.  SENSORY EXAMINATION:  Decreased vibration and pinprick in lower  extremities.  Absent ankle reflexes.  Downgoing plantar responses.  All  other strength and sensory examination was intact.   IMPRESSION:  1. History of pain in the legs bilaterally (729.5).  2. History of lumbar spine pain (724.4).  3. Suspect lumbar spinal stenosis (724.02).   PLAN:  At this time, we will obtain an MRI study of the lumbar spine.           ______________________________  Genene Churn. Sandria Manly, M.D.     JML/MEDQ  D:  10/08/2006  T:  10/09/2006  Job:  [id number]   cc:   Wilson Singer, M.D.  Fax: [phone]   Maurice March, M.D.  Fax: [phone]

## 2010-06-12 NOTE — H&P (Signed)
[name], [name] NO.:  [id number]   MEDICAL RECORD NO.:  [id number]          PATIENT TYPE:  INP   LOCATION:  1224                         FACILITY:  [facility]   PHYSICIAN:  Della Goo, M.D. DATE OF BIRTH:  1950-03-11   DATE OF ADMISSION:  10/08/2006  DATE OF DISCHARGE:  10/09/2006                              HISTORY & PHYSICAL   PRIMARY CARE PHYSICIAN:  [facility]   CHIEF COMPLAINT:  Severe weakness   HISTORY OF PRESENT ILLNESS:  This is a 60 year old female presenting to  the emergency department secondary to complaints of progressive weakness  over the past 2 days.  She reports that the weakness was gradual in  onset and has been worsening..  The patient does report having severe  leg cramps as well.  She does have type 2 diabetes mellitus and does  report having increased thirst and increased urination.  She denies  having any abdominal pain, nausea, vomiting, diarrhea or constipation.   PAST MEDICAL HISTORY:  History of breast cancer of the right breast  status post mastectomy and total abdominal hysterectomy, type 2 diabetes  mellitus, glaucoma, hypertension, hypothyroidism, internal hemorrhoids.   MEDICATIONS:  1. Metformin 1000 mg one p.o. b.i.d.  2. Ibuprofen 800 mg one p.o. t.i.d. p.r.n. pain  3. Lantus insulin 30 units subcu q.h.s.  4. Levothyroxine 88 mcg one p.o. daily  5. Effexor XR 75 mg one p.o. daily  6. Pravastatin 20 mg one p.o. daily  7. Xalatan ophthalmic drops 0.005% solution 1 drop into right eye      q.h.s.  8. NovoLog sliding scale insulin coverage p.r.n.  9. Triamterene hydrochlorothiazide 37.5 mg/25 mg one p.o. q.a.m.  10.Omeprazole 20 mg one p.o. b.i.d.   ALLERGIES:  To PENICILLIN and SULFA which both cause hives.   SOCIAL HISTORY:  The patient is a nonsmoker, nondrinker. The patient  returned from an ocean cruise 3 days ago.   FAMILY HISTORY:  Positive for coronary artery disease and hypertension.   REVIEW OF  SYSTEMS:  Pertinents are mentioned above.  The patient denies  having any chest pain, shortness of breath.   PHYSICAL EXAMINATION FINDINGS:  This is an obese 60 year old well-  developed female in discomfort but no acute distress.  VITAL SIGNS:  Temperature 98.3, blood pressure 147/96, heart rate 87, respirations 18,  O2 saturations 100% on room air.  HEENT: Examination normocephalic, atraumatic.  Pupils equally round  reactive to light.  Extraocular muscles are intact, funduscopic benign.  There is no scleral icterus.  Oropharynx is clear.  NECK is supple full range of motion.  No thyromegaly, adenopathy or  jugular venous distention.  CARDIOVASCULAR:  Regular rate and rhythm.  No murmurs, gallops or rubs.  LUNGS: Clear to auscultation bilaterally.  ABDOMEN:  Positive bowel sounds, soft, nontender, nondistended.  EXTREMITIES: Without cyanosis, clubbing or edema.  NEUROLOGIC EXAMINATION:  The patient is alert and oriented.  She is  sluggish and has generalized weakness.  There are no focal deficits.  There is no myoclonus and her deep tendon reflexes are normal.   LABORATORY STUDIES:  White blood cell  count 10.6, hemoglobin 12.7,  hematocrit 37.3, platelets 285, neutrophils 82%, lymphocytes 13%, MCV  90.6.  Chemistry:  Sodium is 138, potassium 3.1, chloride 105, CO2 24,  BUN 20, creatinine 1.16 and glucose 332.  Urinalysis, negative nitrites,  small leukocyte esterase, creatinine kinase 96.   ASSESSMENT:  61 year old female being admitted with  1. Progressive weakness  2. Mild hypokalemia.  3. Hyperglycemia with type 2 diabetes mellitus.  4. History of hypothyroidism.  5. History of breast cancer   PLAN:  The patient will be admitted to the area for cardiac monitoring  and cardiac enzymes will be performed.  An MRI study of the brain has  been ordered secondary to the patient's progressive generalized  weakness.  Her metformin therapy has been held secondary to concerns of   possible lactic acidosis and a lactic acid level has been ordered.  Pravastatin therapy has also been held secondary to concerns of  myalgias/myopathy and the creatinine kinase levels will be monitored.  A  TSH level will also be ordered secondary to the patient's history of  hypothyroidism, but also secondary to her generalized progressive  weakness.  The patient will continue on her other regular medications,  and will have replacement of her electrolytes as needed.  A neurology  consultation will be placed pending results of her workup.  DVT and GI  prophylaxis have been ordered.  The patient had been placed on the IV  insulin drip via the Glucommander for elevated blood sugars.  The  patient will also be placed on sliding scale insulin therapy as needed  following discontinuation of the Glucommander.  A hemoglobin A1c level  will also be checked.Della Goo, M.D.  Electronically Signed     HJ/MEDQ  D:  10/08/2006  T:  10/09/2006  Job:  [id number]

## 2010-06-15 ENCOUNTER — Inpatient Hospital Stay (INDEPENDENT_AMBULATORY_CARE_PROVIDER_SITE_OTHER)
Admission: RE | Admit: 2010-06-15 | Discharge: 2010-06-15 | Disposition: A | Discharge status: Home / Self Care | Payer: Medicare Other | Source: Ambulatory Visit | Attending: Family Medicine | Admitting: Family Medicine

## 2010-06-15 DIAGNOSIS — H9319 Tinnitus, unspecified ear: Secondary | ICD-10-CM

## 2010-06-15 NOTE — Op Note (Signed)
   NAMEHAJA, CREGO                         ACCOUNT NO.:  [id number]   MEDICAL RECORD NO.:  [id number]                   PATIENT TYPE:  AMB   LOCATION:  ENDO                                 [facility]ACILITY:  [facility]   PHYSICIAN:  John C. Madilyn Fireman, M.D.                 DATE OF BIRTH:  October 21, 1950   DATE OF PROCEDURE:  10/18/2002  DATE OF DISCHARGE:                                 OPERATIVE REPORT   PROCEDURE:  Colonoscopy.   INDICATIONS FOR PROCEDURE:  Rectal bleeding in a 60 year old patient with  history of breast cancer.   DESCRIPTION OF PROCEDURE:  The patient was placed in the left lateral  decubitus position and placed on the pulse monitor with continuous low-flow  oxygen delivered by nasal cannula.  She was sedated with 75 mcg IV fentanyl,  8 mg IV Versed.  The Olympus video colonoscope was inserted into the rectum  and advanced to the cecum, confirmed by transillumination of McBurney's  point and visualization of the ileocecal valve and appendiceal orifice.  Prep was excellent.  The cecum, ascending, transverse, descending, and  sigmoid colon all appeared normal with no masses, polyps, diverticula, or  other mucosal abnormalities.  The rectum likewise appeared normal and  retroflexed view of the anus revealed some small internal hemorrhoids.  The  colonoscope was then withdrawn and the patient returned to the recovery room  in stable condition.  She tolerated the procedure well and there were no  immediate complications.   IMPRESSION:  Internal hemorrhoids; otherwise normal.   PLAN:  The next colon screening by sigmoidoscopy or colonoscopy in five  years.                                                John C. Madilyn Fireman, M.D.    JCH/MEDQ  D:  10/18/2002  T:  10/18/2002  Job:  [id number]   cc:   Genene Churn. Cyndie Chime, M.D.  [address] [facility]  [place]  [place] [address]  Fax: [phone]

## 2010-06-15 NOTE — Op Note (Signed)
[facility]  Patient:    [name], [name] Visit Number: [id number] MRN: [id number]          Service Type: DSU Location: DAY Attending Physician:  Evlyn Clines Proc. Date: 09/18/00 Adm. Date:  09/18/2000   CC:         Genene Churn. Cyndie Chime, M.D.   Operative Report  PROCEDURE:  Cystoscopy, right retrograde pyelogram with interpretation, unroofing of right ureterocele.  PREOPERATIVE DIAGNOSIS:  Right flank pain with hydronephrosis.  POSTOPERATIVE DIAGNOSES:  Right flank pain with hydronephrosis with a simple right ureterocele.  SURGEON:  Excell Seltzer. Annabell Howells, M.D.  ANESTHESIA:  General.  COMPLICATIONS:  None.  INDICATIONS:  Ms. Callegari is a 60 year old white female, who had the onset on July 18, of some right flank pain.  CT in the ER on July 24, demonstrated a right renal cyst and mild hydronephrosis, but no stone was seen.  On my initial evaluation, I thought she might have musculoskeletal pain no acute distress began Flexeril and ibuprofen and Vicodin, but she did not improve. An IVP in the office demonstrated fullness of the right intrarenal collecting system without obvious level of obstruction.  It was felt that cystoscopy, retrogrades, and possible ureteroscopy were indicated to further evaluate her condition.  FINDINGS AT PROCEDURE:  She received p.o. Tequin and was taken to the operating room where a general anesthetic was induced.  She was placed in the lithotomy position.  Her perineum and genitalia were prepped with Betadine solution.  She was draped in the usual sterile fashion.  Cystoscopy was performed using the 22 [place] scope and the 12 and 70 degree lenses. Examination revealed a normal urethra.  The bladder wall was smooth and pale without tumor, stones, or inflammation.  The left ureteral orifice was unremarkable.  The right ureteral orifice had a little bit of ballooning, suggestive of ureterocele.  The right ureteral orifice  was cannulated with a 5 French open-end catheter, and contrast was instilled without difficulty.  This examination revealed some fullness of the right internal collecting system with blunting of the calices, suggestive of hydronephrosis; however, the ureter exhibited no areas of stenosis, no intraureteral filling defects were appreciated, and certainly no stones were seen.  On the drainage film, however, the presence of a simple ureterocele with a very tight urinary jet was noted, and it was felt that it would be worthwhile to unroof the ureterocele to see if that would impact her pain complaints.  An endoscopic scissors was passed through the scope.  One blade was placed inside the ureteral meatus, the other outside, and the scissors were closed, opening the meatus.  Two additional cuts were made, extending the meatal opening to approximately 1 cm.  Once this had been completed, she was noted to have some mild venous oozing from the cut edges.  I elected not to cauterize these for fear of producing scarring.  However, the bleeding was felt to be minimal.  At this point, the bladder was drained.  The patients anesthetic was reversed, and she was moved to the recovery room in stable condition.  There were no complications during the procedure. Attending Physician:  Evlyn Clines DD:  09/18/00 TD:  09/18/00 Job: [id number]

## 2010-06-15 NOTE — Op Note (Signed)
[facility]  Patient:    [name], [name]                        MRN: [id number] Proc. Date: 05/01/99 Adm. Date:  [id number] Attending:  Stephenie Acres                           Operative Report  PREOPERATIVE DIAGNOSIS:  Breast cancer and lymphoma.  POSTOPERATIVE DIAGNOSIS:  Breast cancer and lymphoma.  OPERATION PERFORMED:  Removal of Port-A-Cath.  SURGEON:  Stephenie Acres, M.D.  ANESTHESIA:  Local MAC.  DESCRIPTION OF PROCEDURE:  The patient was taken to the operating room and placed in supine position.  After adequate anesthesia was induced using MAC technique, the left chest was prepped and draped in normal sterile fashion. Using 1% lidocaine, the skin overlying and surrounding the Port-A-Cath was anesthetized.  A transverse incision was made and dissected down.  The Port-A-Cath was easily delivered out through the wound.  Two small nylon sutures were clipped.  The Port-A-Cath was removed.  There was a large amount of venous blood coming from the tract and pressure was held for about 10 minutes on the subclavian area.  This appeared to stop any further bleeding. The skin was closed with a subcuticular 4-0 Monocryl.  A pressure dressing was placed.  The patient was taken to the recovery room in the upright position to be monitored. DD:  05/01/99 TD:  05/02/99 Job: [id number]

## 2010-07-24 ENCOUNTER — Encounter (HOSPITAL_BASED_OUTPATIENT_CLINIC_OR_DEPARTMENT_OTHER): Payer: Medicare Other | Admitting: Oncology

## 2010-07-24 ENCOUNTER — Other Ambulatory Visit: Payer: Self-pay | Admitting: Oncology

## 2010-07-24 ENCOUNTER — Ambulatory Visit
Admission: RE | Admit: 2010-07-24 | Discharge: 2010-07-24 | Disposition: A | Discharge status: Home / Self Care | Payer: Medicare Other | Source: Ambulatory Visit | Attending: Oncology | Admitting: Oncology

## 2010-07-24 DIAGNOSIS — Z1239 Encounter for other screening for malignant neoplasm of breast: Secondary | ICD-10-CM

## 2010-07-24 DIAGNOSIS — C50219 Malignant neoplasm of upper-inner quadrant of unspecified female breast: Secondary | ICD-10-CM

## 2010-07-24 LAB — CBC WITH DIFFERENTIAL/PLATELET
BASO%: 0.3 % (ref 0.0–2.0)
Basophils Absolute: 0 10*3/uL (ref 0.0–0.1)
EOS%: 2.4 % (ref 0.0–7.0)
Eosinophils Absolute: 0.2 10*3/uL (ref 0.0–0.5)
HCT: 34.7 % — ABNORMAL LOW (ref 34.8–46.6)
HGB: 11.6 g/dL (ref 11.6–15.9)
LYMPH%: 21.5 % (ref 14.0–49.7)
MCH: 30.8 pg (ref 25.1–34.0)
MCHC: 33.4 g/dL (ref 31.5–36.0)
MCV: 92.1 fL (ref 79.5–101.0)
MONO#: 0.4 10*3/uL (ref 0.1–0.9)
MONO%: 4.8 % (ref 0.0–14.0)
NEUT#: 6.1 10*3/uL (ref 1.5–6.5)
NEUT%: 71 % (ref 38.4–76.8)
Platelets: 226 10*3/uL (ref 145–400)
RBC: 3.76 10*6/uL (ref 3.70–5.45)
RDW: 13 % (ref 11.2–14.5)
WBC: 8.7 10*3/uL (ref 3.9–10.3)
lymph#: 1.9 10*3/uL (ref 0.9–3.3)

## 2010-07-24 LAB — COMPREHENSIVE METABOLIC PANEL
ALT: 8 U/L (ref 0–35)
AST: 15 U/L (ref 0–37)
Albumin: 4.2 g/dL (ref 3.5–5.2)
Alkaline Phosphatase: 62 U/L (ref 39–117)
BUN: 20 mg/dL (ref 6–23)
CO2: 22 mEq/L (ref 19–32)
Calcium: 10.2 mg/dL (ref 8.4–10.5)
Chloride: 105 mEq/L (ref 96–112)
Creatinine, Ser: 1.08 mg/dL (ref 0.50–1.10)
Glucose, Bld: 198 mg/dL — ABNORMAL HIGH (ref 70–99)
Potassium: 3.7 mEq/L (ref 3.5–5.3)
Sodium: 139 mEq/L (ref 135–145)
Total Bilirubin: 0.3 mg/dL (ref 0.3–1.2)
Total Protein: 6.7 g/dL (ref 6.0–8.3)

## 2010-07-24 LAB — LACTATE DEHYDROGENASE: LDH: 117 U/L (ref 94–250)

## 2010-08-03 ENCOUNTER — Encounter (HOSPITAL_BASED_OUTPATIENT_CLINIC_OR_DEPARTMENT_OTHER): Payer: Medicare Other | Admitting: Oncology

## 2010-08-03 ENCOUNTER — Other Ambulatory Visit: Payer: Self-pay | Admitting: Oncology

## 2010-08-03 DIAGNOSIS — Z9010 Acquired absence of unspecified breast and nipple: Secondary | ICD-10-CM

## 2010-08-03 DIAGNOSIS — Z853 Personal history of malignant neoplasm of breast: Secondary | ICD-10-CM

## 2010-11-09 LAB — VITAMIN B12: Vitamin B-12: 679 (ref 211–911)

## 2010-11-09 LAB — DIFFERENTIAL
Basophils Absolute: 0
Basophils Relative: 0
Eosinophils Absolute: 0.1
Eosinophils Relative: 1
Lymphocytes Relative: 13
Lymphs Abs: 1.4
Monocytes Absolute: 0.4
Monocytes Relative: 4
Neutro Abs: 8.7 — ABNORMAL HIGH
Neutrophils Relative %: 82 — ABNORMAL HIGH

## 2010-11-09 LAB — HEPATIC FUNCTION PANEL
ALT: 15
AST: 19
Albumin: 3.1 — ABNORMAL LOW
Alkaline Phosphatase: 59
Bilirubin, Direct: 0.1
Indirect Bilirubin: 0.5
Total Bilirubin: 0.6
Total Protein: 6

## 2010-11-09 LAB — COMPREHENSIVE METABOLIC PANEL
ALT: 20
AST: 24
Albumin: 4
Alkaline Phosphatase: 75
BUN: 19
CO2: 23
Calcium: 9.8
Chloride: 109
Creatinine, Ser: 1.1
GFR calc Af Amer: >60
GFR calc non Af Amer: 51 — ABNORMAL LOW
Glucose, Bld: 170 — ABNORMAL HIGH
Potassium: 3.5
Sodium: 141
Total Bilirubin: 0.8
Total Protein: 7.6

## 2010-11-09 LAB — BASIC METABOLIC PANEL
BUN: 11
BUN: 20
CO2: 23
CO2: 24
Calcium: 10
Calcium: 8.7
Chloride: 105
Chloride: 113 — ABNORMAL HIGH
Creatinine, Ser: 1
Creatinine, Ser: 1.16
GFR calc Af Amer: 58 — ABNORMAL LOW
GFR calc Af Amer: >60
GFR calc non Af Amer: 48 — ABNORMAL LOW
GFR calc non Af Amer: 57 — ABNORMAL LOW
Glucose, Bld: 138 — ABNORMAL HIGH
Glucose, Bld: 332 — ABNORMAL HIGH
Potassium: 3.1 — ABNORMAL LOW
Potassium: 3.7
Sodium: 138
Sodium: 140

## 2010-11-09 LAB — CBC
HCT: 35 — ABNORMAL LOW
HCT: 37.3
Hemoglobin: 11.8 — ABNORMAL LOW
Hemoglobin: 12.7
MCHC: 33.9
MCHC: 34
MCV: 90.6
MCV: 91
Platelets: 242
Platelets: 285
RBC: 3.84 — ABNORMAL LOW
RBC: 4.12
RDW: 13
RDW: 13.1
WBC: 10.6 — ABNORMAL HIGH
WBC: 9.2

## 2010-11-09 LAB — HEAVY METALS, BLOOD
Arsenic: 4 ug/L (ref <11.0)
Lead: 0.5 ug/dL (ref <10.0)
Mercury: <2 ug/L (ref <5.0)

## 2010-11-09 LAB — URINE MICROSCOPIC-ADD ON

## 2010-11-09 LAB — HEMOGLOBIN A1C
Hgb A1c MFr Bld: 13.2 — ABNORMAL HIGH
Mean Plasma Glucose: 393

## 2010-11-09 LAB — POCT PREGNANCY, URINE
Operator id: 29727
Preg Test, Ur: NEGATIVE

## 2010-11-09 LAB — URINALYSIS, ROUTINE W REFLEX MICROSCOPIC
Bilirubin Urine: NEGATIVE
Glucose, UA: >1000 — AB
Ketones, ur: NEGATIVE
Nitrite: NEGATIVE
Protein, ur: NEGATIVE
Specific Gravity, Urine: 1.023
Urobilinogen, UA: 0.2
pH: 6.5

## 2010-11-09 LAB — TSH
TSH: 16.601 — ABNORMAL HIGH
TSH: 24.605 — ABNORMAL HIGH

## 2010-11-09 LAB — TROPONIN I: Troponin I: 0.01

## 2010-11-09 LAB — CK TOTAL AND CKMB (NOT AT ARMC)
CK, MB: 3.2
Relative Index: 3.2 — ABNORMAL HIGH
Total CK: 100

## 2010-11-09 LAB — CK: Total CK: 96

## 2010-11-09 LAB — LACTIC ACID, PLASMA: Lactic Acid, Venous: 3.6 — ABNORMAL HIGH

## 2010-12-25 ENCOUNTER — Encounter: Payer: Self-pay | Admitting: *Deleted

## 2010-12-25 NOTE — Progress Notes (Signed)
Faxed signed order to [facility] for 5 post surg bras & prosthesis.

## 2011-02-05 DIAGNOSIS — I8310 Varicose veins of unspecified lower extremity with inflammation: Secondary | ICD-10-CM | POA: Diagnosis not present

## 2011-02-08 DIAGNOSIS — E039 Hypothyroidism, unspecified: Secondary | ICD-10-CM | POA: Diagnosis not present

## 2011-02-08 DIAGNOSIS — I10 Essential (primary) hypertension: Secondary | ICD-10-CM | POA: Diagnosis not present

## 2011-02-08 DIAGNOSIS — E119 Type 2 diabetes mellitus without complications: Secondary | ICD-10-CM | POA: Diagnosis not present

## 2011-02-21 DIAGNOSIS — H4011X0 Primary open-angle glaucoma, stage unspecified: Secondary | ICD-10-CM | POA: Diagnosis not present

## 2011-02-21 DIAGNOSIS — H2510 Age-related nuclear cataract, unspecified eye: Secondary | ICD-10-CM | POA: Diagnosis not present

## 2011-02-21 DIAGNOSIS — H25019 Cortical age-related cataract, unspecified eye: Secondary | ICD-10-CM | POA: Diagnosis not present

## 2011-02-21 DIAGNOSIS — H11159 Pinguecula, unspecified eye: Secondary | ICD-10-CM | POA: Diagnosis not present

## 2011-02-22 DIAGNOSIS — M79609 Pain in unspecified limb: Secondary | ICD-10-CM | POA: Diagnosis not present

## 2011-02-22 DIAGNOSIS — I8310 Varicose veins of unspecified lower extremity with inflammation: Secondary | ICD-10-CM | POA: Diagnosis not present

## 2011-03-04 ENCOUNTER — Telehealth: Payer: Self-pay | Admitting: Oncology

## 2011-03-04 NOTE — Telephone Encounter (Signed)
S/w the pt regarding her juine/july 2013 appts along with the mammo appt

## 2011-03-25 DIAGNOSIS — E785 Hyperlipidemia, unspecified: Secondary | ICD-10-CM | POA: Diagnosis not present

## 2011-03-25 DIAGNOSIS — E119 Type 2 diabetes mellitus without complications: Secondary | ICD-10-CM | POA: Diagnosis not present

## 2011-03-25 DIAGNOSIS — I10 Essential (primary) hypertension: Secondary | ICD-10-CM | POA: Diagnosis not present

## 2011-03-25 DIAGNOSIS — E039 Hypothyroidism, unspecified: Secondary | ICD-10-CM | POA: Diagnosis not present

## 2011-03-26 DIAGNOSIS — H4011X0 Primary open-angle glaucoma, stage unspecified: Secondary | ICD-10-CM | POA: Diagnosis not present

## 2011-04-02 DIAGNOSIS — M79609 Pain in unspecified limb: Secondary | ICD-10-CM | POA: Diagnosis not present

## 2011-04-02 DIAGNOSIS — I8310 Varicose veins of unspecified lower extremity with inflammation: Secondary | ICD-10-CM | POA: Diagnosis not present

## 2011-05-14 DIAGNOSIS — I8310 Varicose veins of unspecified lower extremity with inflammation: Secondary | ICD-10-CM | POA: Diagnosis not present

## 2011-05-14 DIAGNOSIS — M79609 Pain in unspecified limb: Secondary | ICD-10-CM | POA: Diagnosis not present

## 2011-05-14 DIAGNOSIS — M7981 Nontraumatic hematoma of soft tissue: Secondary | ICD-10-CM | POA: Diagnosis not present

## 2011-06-03 DIAGNOSIS — E039 Hypothyroidism, unspecified: Secondary | ICD-10-CM | POA: Diagnosis not present

## 2011-06-03 DIAGNOSIS — E785 Hyperlipidemia, unspecified: Secondary | ICD-10-CM | POA: Diagnosis not present

## 2011-06-03 DIAGNOSIS — I10 Essential (primary) hypertension: Secondary | ICD-10-CM | POA: Diagnosis not present

## 2011-06-03 DIAGNOSIS — E119 Type 2 diabetes mellitus without complications: Secondary | ICD-10-CM | POA: Diagnosis not present

## 2011-07-12 ENCOUNTER — Telehealth: Payer: Self-pay | Admitting: *Deleted

## 2011-07-12 NOTE — Telephone Encounter (Signed)
Faxed order for breast prosthesis to [facility] @ [phone].

## 2011-07-24 ENCOUNTER — Telehealth: Payer: Self-pay

## 2011-07-24 ENCOUNTER — Other Ambulatory Visit (HOSPITAL_BASED_OUTPATIENT_CLINIC_OR_DEPARTMENT_OTHER): Payer: Medicare Other | Admitting: Lab

## 2011-07-24 DIAGNOSIS — R29898 Other symptoms and signs involving the musculoskeletal system: Secondary | ICD-10-CM

## 2011-07-24 DIAGNOSIS — C50219 Malignant neoplasm of upper-inner quadrant of unspecified female breast: Secondary | ICD-10-CM

## 2011-07-24 LAB — CBC WITH DIFFERENTIAL/PLATELET
BASO%: 0.5 % (ref 0.0–2.0)
Basophils Absolute: 0 10*3/uL (ref 0.0–0.1)
EOS%: 2 % (ref 0.0–7.0)
Eosinophils Absolute: 0.1 10*3/uL (ref 0.0–0.5)
HCT: 37.5 % (ref 34.8–46.6)
HGB: 12.4 g/dL (ref 11.6–15.9)
LYMPH%: 28.4 % (ref 14.0–49.7)
MCH: 30.5 pg (ref 25.1–34.0)
MCHC: 33 g/dL (ref 31.5–36.0)
MCV: 92.2 fL (ref 79.5–101.0)
MONO#: 0.4 10*3/uL (ref 0.1–0.9)
MONO%: 5.9 % (ref 0.0–14.0)
NEUT#: 4.3 10*3/uL (ref 1.5–6.5)
NEUT%: 63.2 % (ref 38.4–76.8)
Platelets: 214 10*3/uL (ref 145–400)
RBC: 4.07 10*6/uL (ref 3.70–5.45)
RDW: 12.8 % (ref 11.2–14.5)
WBC: 6.9 10*3/uL (ref 3.9–10.3)
lymph#: 1.9 10*3/uL (ref 0.9–3.3)

## 2011-07-24 LAB — COMPREHENSIVE METABOLIC PANEL
ALT: 10 U/L (ref 0–35)
AST: 14 U/L (ref 0–37)
Albumin: 3.9 g/dL (ref 3.5–5.2)
Alkaline Phosphatase: 69 U/L (ref 39–117)
BUN: 31 mg/dL — ABNORMAL HIGH (ref 6–23)
CO2: 23 mEq/L (ref 19–32)
Calcium: 9.3 mg/dL (ref 8.4–10.5)
Chloride: 94 mEq/L — ABNORMAL LOW (ref 96–112)
Creatinine, Ser: 1.64 mg/dL — ABNORMAL HIGH (ref 0.50–1.10)
Glucose, Bld: 526 mg/dL (ref 70–99)
Potassium: 3.3 mEq/L — ABNORMAL LOW (ref 3.5–5.3)
Sodium: 129 mEq/L — ABNORMAL LOW (ref 135–145)
Total Bilirubin: 0.4 mg/dL (ref 0.3–1.2)
Total Protein: 6.4 g/dL (ref 6.0–8.3)

## 2011-07-24 LAB — LACTATE DEHYDROGENASE: LDH: 124 U/L (ref 94–250)

## 2011-07-24 NOTE — Telephone Encounter (Signed)
Received call from lab re: panic glucose on pt this morning at 0905 of 526.  Called pt on cell phone.  She states she was fasting this morning, ate something after labs, and took her Levemir insulin before she ate.  Pt states she usually checks her blood sugar TID, but has not done so since this morning.  Pt states she took her Lantus last night.  Pt states she is currently at her son's house watching her grandchild, and has her Levemir pen, but not her glucose meter.  Pt states she is feeling fine.  Per Lonna Cobb, NP, pt needs to get her glucose meter and check her blood sugar, and call PCP office.  Called pt back and informed her of this, and that this RN will contact PCP office as well.  Faxed these lab results to Dr. Georganna Skeans at [phone].  Called and spoke with Chantel, assistant with Dr. Andrey Campanile, and informed her of all the above, and to contact pt- gave her cell phone number.

## 2011-07-25 ENCOUNTER — Ambulatory Visit
Admission: RE | Admit: 2011-07-25 | Discharge: 2011-07-25 | Disposition: A | Discharge status: Home / Self Care | Payer: Medicare Other | Source: Ambulatory Visit | Attending: Oncology | Admitting: Oncology

## 2011-07-25 DIAGNOSIS — Z9010 Acquired absence of unspecified breast and nipple: Secondary | ICD-10-CM

## 2011-07-25 DIAGNOSIS — Z1231 Encounter for screening mammogram for malignant neoplasm of breast: Secondary | ICD-10-CM | POA: Diagnosis not present

## 2011-07-26 ENCOUNTER — Telehealth: Payer: Self-pay | Admitting: *Deleted

## 2011-07-26 NOTE — Telephone Encounter (Signed)
Called pt to f/u on elevated glucose from 07/24/11.  She reports that she checked her glucose this am & it was 192.  She is watching this closely & her PCP had tried tocontact her & said she would call back but but hasn't received call back yet.  She reports that she does have f/u with PCP soon.  She mentioned that she has had some teeth pulled & hasn't been able to eat like she should.

## 2011-07-30 DIAGNOSIS — H2510 Age-related nuclear cataract, unspecified eye: Secondary | ICD-10-CM | POA: Diagnosis not present

## 2011-07-30 DIAGNOSIS — H25019 Cortical age-related cataract, unspecified eye: Secondary | ICD-10-CM | POA: Diagnosis not present

## 2011-07-30 DIAGNOSIS — E11329 Type 2 diabetes mellitus with mild nonproliferative diabetic retinopathy without macular edema: Secondary | ICD-10-CM | POA: Diagnosis not present

## 2011-07-30 DIAGNOSIS — H4011X0 Primary open-angle glaucoma, stage unspecified: Secondary | ICD-10-CM | POA: Diagnosis not present

## 2011-08-02 ENCOUNTER — Telehealth: Payer: Self-pay | Admitting: Oncology

## 2011-08-02 ENCOUNTER — Ambulatory Visit (HOSPITAL_BASED_OUTPATIENT_CLINIC_OR_DEPARTMENT_OTHER): Payer: Medicare Other | Admitting: Oncology

## 2011-08-02 VITALS — BP 134/73 | HR 73 | Temp 97.8°F | Ht 63.5 in | Wt 163.8 lb

## 2011-08-02 DIAGNOSIS — Z853 Personal history of malignant neoplasm of breast: Secondary | ICD-10-CM | POA: Diagnosis not present

## 2011-08-02 NOTE — Telephone Encounter (Signed)
Gave pt appt for 2014 lab, mammogram then see MD after a week

## 2011-08-02 NOTE — Progress Notes (Signed)
Hematology and Oncology Follow Up Visit  [name] [id number] 26-Jul-1950 61 y.o. 08/02/2011 4:46 PM   Principle Diagnosis: Encounter Diagnosis  Name Primary?  [name] BREAST CANCER, HX OF Yes     Interim History:  Follow-up visit for this 61 year old woman initially diagnosed with a Stage III B lymph node positive cancer of the right breast in April of 1999, treated with induction chemotherapy followed by a right mastectomy then radiation and additional chemotherapy.  She achieved a durable response with no evidence for recurrence.  Overall she is doing well. Her diabetes remains poorly controlled. She had lab last week in anticipation of today's visit and her glucose was over 500. BUN and creatinine were up.  She denies any headache. She has had a change in vision recently and was evaluated and told that she has a "leak" in her left eye and is due to see a specialist next week. She has had previous laser surgery due to the diabetes. She denies any bone pain. No vaginal bleeding. She has had a recent change in bowel habit with increasing constipation but no hematochezia or melena.  Medications: reviewed  Allergies:  Allergies  Allergen Reactions  . Penicillins   . Sulfonamide Derivatives     Review of Systems: Constitutional:   No constitutional symptoms Respiratory: No cough or dyspnea Cardiovascular:  No chest pain or palpitations Gastrointestinal: See above Genito-Urinary: See above Musculoskeletal: See above Neurologic: See above Skin: No rash Remaining ROS negative.  Physical Exam: Blood pressure 134/73, pulse 73, temperature 97.8 F (36.6 C), temperature source Oral, height 5' 3.5" (1.613 m), weight 163 lb 12.8 oz (74.299 kg). Wt Readings from Last 3 Encounters:  08/02/11 163 lb 12.8 oz (74.299 kg)     General appearance: Well-nourished African American woman HENNT: Pharynx no erythema or exudate Lymph nodes: No cervical, supraclavicular, or axillary  adenopathy Breasts: Right mastectomy no chest wall lesions. Large left breast no dominant mass Lungs: Clear to auscultation resonant to percussion Heart: Regular rhythm no murmur Abdomen: Soft nontender Extremities: No edema no calf tenderness Vascular: No cyanosis Neurologic: No focal deficit Skin: No rash or ecchymosis  Lab Results: Lab Results  Component Value Date   WBC 6.9 07/24/2011   HGB 12.4 07/24/2011   HCT 37.5 07/24/2011   MCV 92.2 07/24/2011   PLT 214 07/24/2011     Chemistry      Component Value Date/Time   NA 129* 07/24/2011 0905   K 3.3* 07/24/2011 0905   CL 94* 07/24/2011 0905   CO2 23 07/24/2011 0905   BUN 31* 07/24/2011 0905   CREATININE 1.64* 07/24/2011 0905      Component Value Date/Time   CALCIUM 9.3 07/24/2011 0905   ALKPHOS 69 07/24/2011 0905   AST 14 07/24/2011 0905   ALT 10 07/24/2011 0905   BILITOT 0.4 07/24/2011 0905    The glucose was 526 on June 26 repeat result today pending. Patient states finger stick glucose this morning was 212.  Radiological Studies: Mm Digital Screening Unilat L  07/25/2011  *RADIOLOGY REPORT*  Clinical Data: Screening. History of a prior malignant right mastectomy 1999 and prior bilateral malignant excisional biopsy 1999.  MAMMOGRAPHIC UNILATERAL LEFT DIGITAL SCREENING WITH CAD  Comparison:  Previous exams  Findings:  There are scattered fibroglandular densities. No suspicious masses, architectural distortion, or calcifications are present.  Images were processed with CAD.  IMPRESSION: No specific mammographic evidence of malignancy.  A result letter of this screening mammogram will be mailed directly to the  patient.  RECOMMENDATION: Screening mammogram in one year. (Code:SM-B-01Y)  BI-RADS CATEGORY 1:  Negative  Original Report Authenticated By: Elba Barman, M.D.    Impression and Plan: #1. Stage III invasive cancer right breast treated as outlined above she remains free of any obvious recurrence now out a remarkable 14  years.  #2. Poorly controlled diabetes. She is strongly encouraged to discuss her diabetes management with her primary care physician. I gave her a flow sheet of her labs for the last 2 years through our office to bring with her when she sees her primary care doctor next week.  #3. Essential hypertension  #4. Hypothyroid on replacement. In view of her constipation she also needs to have her thyroid functions checked. I am not sure when she had her last colonoscopy but this would also be a consideration. She is now 61 years old. Her current hemoglobin is at her chronic baseline of 12 g. MCV 92.   CC:. Dr. Georganna Skeans at [facility]    Levert Feinstein, MD 7/5/20134:46 PM

## 2011-08-15 DIAGNOSIS — E119 Type 2 diabetes mellitus without complications: Secondary | ICD-10-CM | POA: Diagnosis not present

## 2011-08-15 DIAGNOSIS — I10 Essential (primary) hypertension: Secondary | ICD-10-CM | POA: Diagnosis not present

## 2011-08-15 DIAGNOSIS — E669 Obesity, unspecified: Secondary | ICD-10-CM | POA: Diagnosis not present

## 2011-08-15 DIAGNOSIS — E039 Hypothyroidism, unspecified: Secondary | ICD-10-CM | POA: Diagnosis not present

## 2011-08-16 ENCOUNTER — Encounter (INDEPENDENT_AMBULATORY_CARE_PROVIDER_SITE_OTHER): Payer: Medicare Other | Admitting: Ophthalmology

## 2011-08-16 DIAGNOSIS — H35039 Hypertensive retinopathy, unspecified eye: Secondary | ICD-10-CM | POA: Diagnosis not present

## 2011-08-16 DIAGNOSIS — H2510 Age-related nuclear cataract, unspecified eye: Secondary | ICD-10-CM

## 2011-08-16 DIAGNOSIS — E11359 Type 2 diabetes mellitus with proliferative diabetic retinopathy without macular edema: Secondary | ICD-10-CM | POA: Diagnosis not present

## 2011-08-16 DIAGNOSIS — H43819 Vitreous degeneration, unspecified eye: Secondary | ICD-10-CM | POA: Diagnosis not present

## 2011-08-16 DIAGNOSIS — E1139 Type 2 diabetes mellitus with other diabetic ophthalmic complication: Secondary | ICD-10-CM

## 2011-08-16 DIAGNOSIS — H3581 Retinal edema: Secondary | ICD-10-CM | POA: Diagnosis not present

## 2011-08-16 DIAGNOSIS — I10 Essential (primary) hypertension: Secondary | ICD-10-CM

## 2011-08-16 DIAGNOSIS — E1165 Type 2 diabetes mellitus with hyperglycemia: Secondary | ICD-10-CM

## 2011-09-02 ENCOUNTER — Other Ambulatory Visit (INDEPENDENT_AMBULATORY_CARE_PROVIDER_SITE_OTHER): Payer: Medicare Other | Admitting: Ophthalmology

## 2011-09-02 DIAGNOSIS — E1139 Type 2 diabetes mellitus with other diabetic ophthalmic complication: Secondary | ICD-10-CM

## 2011-09-02 DIAGNOSIS — H3581 Retinal edema: Secondary | ICD-10-CM

## 2011-09-02 DIAGNOSIS — E1165 Type 2 diabetes mellitus with hyperglycemia: Secondary | ICD-10-CM | POA: Diagnosis not present

## 2011-10-16 DIAGNOSIS — Z23 Encounter for immunization: Secondary | ICD-10-CM | POA: Diagnosis not present

## 2011-11-19 DIAGNOSIS — H11159 Pinguecula, unspecified eye: Secondary | ICD-10-CM | POA: Diagnosis not present

## 2011-11-19 DIAGNOSIS — H18419 Arcus senilis, unspecified eye: Secondary | ICD-10-CM | POA: Diagnosis not present

## 2011-11-19 DIAGNOSIS — H35379 Puckering of macula, unspecified eye: Secondary | ICD-10-CM | POA: Diagnosis not present

## 2011-11-19 DIAGNOSIS — H35349 Macular cyst, hole, or pseudohole, unspecified eye: Secondary | ICD-10-CM | POA: Diagnosis not present

## 2011-12-12 ENCOUNTER — Encounter: Payer: Self-pay | Admitting: Internal Medicine

## 2011-12-12 ENCOUNTER — Ambulatory Visit (INDEPENDENT_AMBULATORY_CARE_PROVIDER_SITE_OTHER): Payer: Medicare Other | Admitting: Internal Medicine

## 2011-12-12 VITALS — BP 124/79 | HR 86 | Temp 97.0°F | Ht 63.0 in | Wt 167.6 lb

## 2011-12-12 DIAGNOSIS — E1121 Type 2 diabetes mellitus with diabetic nephropathy: Secondary | ICD-10-CM

## 2011-12-12 DIAGNOSIS — E119 Type 2 diabetes mellitus without complications: Secondary | ICD-10-CM | POA: Diagnosis not present

## 2011-12-12 DIAGNOSIS — Z0000 Encounter for general adult medical examination without abnormal findings: Secondary | ICD-10-CM

## 2011-12-12 DIAGNOSIS — E1139 Type 2 diabetes mellitus with other diabetic ophthalmic complication: Secondary | ICD-10-CM | POA: Diagnosis not present

## 2011-12-12 DIAGNOSIS — I10 Essential (primary) hypertension: Secondary | ICD-10-CM | POA: Diagnosis not present

## 2011-12-12 DIAGNOSIS — Z139 Encounter for screening, unspecified: Secondary | ICD-10-CM

## 2011-12-12 DIAGNOSIS — N058 Unspecified nephritic syndrome with other morphologic changes: Secondary | ICD-10-CM | POA: Diagnosis not present

## 2011-12-12 DIAGNOSIS — E1149 Type 2 diabetes mellitus with other diabetic neurological complication: Secondary | ICD-10-CM

## 2011-12-12 DIAGNOSIS — E11319 Type 2 diabetes mellitus with unspecified diabetic retinopathy without macular edema: Secondary | ICD-10-CM

## 2011-12-12 DIAGNOSIS — E1140 Type 2 diabetes mellitus with diabetic neuropathy, unspecified: Secondary | ICD-10-CM

## 2011-12-12 DIAGNOSIS — E785 Hyperlipidemia, unspecified: Secondary | ICD-10-CM

## 2011-12-12 DIAGNOSIS — E039 Hypothyroidism, unspecified: Secondary | ICD-10-CM | POA: Diagnosis not present

## 2011-12-12 DIAGNOSIS — E1129 Type 2 diabetes mellitus with other diabetic kidney complication: Secondary | ICD-10-CM | POA: Diagnosis not present

## 2011-12-12 DIAGNOSIS — M1990 Unspecified osteoarthritis, unspecified site: Secondary | ICD-10-CM

## 2011-12-12 DIAGNOSIS — E1142 Type 2 diabetes mellitus with diabetic polyneuropathy: Secondary | ICD-10-CM

## 2011-12-12 LAB — CBC WITH DIFFERENTIAL/PLATELET
Basophils Absolute: 0 10*3/uL (ref 0.0–0.1)
Basophils Relative: 0 % (ref 0–1)
Eosinophils Absolute: 0.2 10*3/uL (ref 0.0–0.7)
Eosinophils Relative: 2 % (ref 0–5)
HCT: 36.3 % (ref 36.0–46.0)
Hemoglobin: 12.5 g/dL (ref 12.0–15.0)
Lymphocytes Relative: 30 % (ref 12–46)
Lymphs Abs: 2.6 10*3/uL (ref 0.7–4.0)
MCH: 30.9 pg (ref 26.0–34.0)
MCHC: 34.4 g/dL (ref 30.0–36.0)
MCV: 89.9 fL (ref 78.0–100.0)
Monocytes Absolute: 0.3 10*3/uL (ref 0.1–1.0)
Monocytes Relative: 4 % (ref 3–12)
Neutro Abs: 5.5 10*3/uL (ref 1.7–7.7)
Neutrophils Relative %: 64 % (ref 43–77)
Platelets: 246 10*3/uL (ref 150–400)
RBC: 4.04 MIL/uL (ref 3.87–5.11)
RDW: 13.4 % (ref 11.5–15.5)
WBC: 8.6 10*3/uL (ref 4.0–10.5)

## 2011-12-12 LAB — GLUCOSE, CAPILLARY: Glucose-Capillary: 98 mg/dL (ref 70–99)

## 2011-12-12 LAB — POCT GLYCOSYLATED HEMOGLOBIN (HGB A1C): Hemoglobin A1C: 14

## 2011-12-12 MED ORDER — ACETAMINOPHEN 500 MG PO TABS: 500.0000 mg | ORAL_TABLET | Freq: Four times a day (QID) | ORAL | Status: DC | PRN

## 2011-12-12 NOTE — Patient Instructions (Addendum)
-  blood work today. -see our diabetic educator. -keep a log of your blood sugars (twice daily) and bring next visit. -referral made to GI doctor for colonoscopy. -return 1 month.

## 2011-12-12 NOTE — Progress Notes (Signed)
  Subjective:    Patient ID: [name], female    DOB: Mar 13, 1950, 61 y.o.   MRN: [id number]  HPI Presents today as a new patient. She was previously seeing Dr. Georganna Skeans at [facility]. She states she last saw her previous PCP in July 2013. She recently had eye surgery on the left eye for diabetic complications in July by Dr. Ashley Royalty.   See record for obtained pmhx, social hx, psurghx, allergies, medications, famhx confirmed and reviewed with patient. States she is feeling well. Denies CP, SOB. Denies N/V/D. Denies melena and hematochezia.  Denies hematuria. Denies abdominal pain. She admits she's having less frequent bowel movements, which is a change.  She admits she is not seeing as good from her left eye as complication of her diabetes.  Denies any weight loss or significant weight gain.   HbA1c today in clinic is 14%, which indicates an average glucose of 355. Her random blood sugar this morning is 98, which is a fasting value. Denies hypoglycemia.  States her BG usual ranges from 140-3"something". She admits she skips levemir doses "at times". She states she never skips her meal time insulin. She follows with Dr. Cyndie Chime for her hx of breast CA.  Review of Systems Complete 12 point review of systems is otherwise negative except for that stated in the HPI.    Objective:   Physical Exam Filed Vitals:   12/12/11 0908  BP: 124/79  Pulse: 86  Temp: 97 F (36.1 C)   GEN: AAOx3 NAD. HEENT: EOMI, PERRLA, no icterus, no adenopathy. CV: S1S2, no m/r/g, RRR. PULM: CTA bilat. ABD/GI: Soft, NT, +BS, no guarding, no distention, no HSM, no palpable masses. LE/UE: 2/4 pulses. Decreased sensation to monofilament bilat feet. No ulcers. No c/c/e bilat. NEURO: CN II-XII intact, no focal deficits except for monofilament in bilat feet. Skin: No lesions, no rashes, no petechiae.     Assessment & Plan:  61 yr. Old female with pmhx significant for HTN, HL, IDDM type 2, Stage IIIB lymph  node positive breast CA (1999) treated with chemotherapy, radiation, and right mastectomy, hypothyroidism, OA, depression, possibly underlying diabetic nephropathy and retinopathy as well as diabetic neuropathy, presents to establish as a new patient. 1) IDDM type 2: Her fasting blood sugar is currently 98. She admits to skipping levemir doses at times, although this is not the case today.  I advised her to check her blood sugar at least twice daily and keep a log. I am concerned she is not following a diabetic diet and is skipping insulin doses. I will refer her to our diabetic educator.  2) Possible diabetic nephropathy: Check CMET and urine albumin/creatinine ratio. Advised her to quit the use of ibuprofen, use acetaminophen as needed for OA pain with maximum daily dose explained. Last creatinine 1.03 from [facility] records in 7/13. 3) Diabetic retinopathy: Continue to follow up with current opthalmologist. 4) Glaucoma: Continue current tx, continue to follow with opthalmologist. 5) HTN: Well controlled, no changes today. 6) HL: Check lipid panel today, check LFTs. 7) Hypothyroidism: Check FT4 and TSH. Continue current levothyroxine dose for now. 8) Depression: States effexor is helping her. Denies suicidal thoughts or ideation. No changes today. 9) Health Maintenance: Refer for colonoscopy. She is following with opthalmology. States she's had flu vaccine this year at [facility] (9/13). States she's had pneumovax already.  She is following with Dr. Cyndie Chime for hx breast CA, no recurrence in 14 years.  Jonah Blue

## 2011-12-13 LAB — COMPLETE METABOLIC PANEL WITH GFR
ALT: 9 U/L (ref 0–35)
AST: 15 U/L (ref 0–37)
Albumin: 4.5 g/dL (ref 3.5–5.2)
Alkaline Phosphatase: 59 U/L (ref 39–117)
BUN: 21 mg/dL (ref 6–23)
CO2: 25 mEq/L (ref 19–32)
Calcium: 10.1 mg/dL (ref 8.4–10.5)
Chloride: 106 mEq/L (ref 96–112)
Creat: 1.19 mg/dL — ABNORMAL HIGH (ref 0.50–1.10)
GFR, Est African American: 57 mL/min — ABNORMAL LOW
GFR, Est Non African American: 49 mL/min — ABNORMAL LOW
Glucose, Bld: 99 mg/dL (ref 70–99)
Potassium: 3.8 mEq/L (ref 3.5–5.3)
Sodium: 140 mEq/L (ref 135–145)
Total Bilirubin: 0.3 mg/dL (ref 0.3–1.2)
Total Protein: 7.1 g/dL (ref 6.0–8.3)

## 2011-12-13 LAB — MICROALBUMIN / CREATININE URINE RATIO
Creatinine, Urine: 101.8 mg/dL
Microalb Creat Ratio: 6.6 mg/g (ref 0.0–30.0)
Microalb, Ur: 0.67 mg/dL (ref 0.00–1.89)

## 2011-12-13 LAB — LIPID PANEL
Cholesterol: 152 mg/dL (ref 0–200)
HDL: 37 mg/dL — ABNORMAL LOW (ref >39)
LDL Cholesterol: 74 mg/dL (ref 0–99)
Total CHOL/HDL Ratio: 4.1 Ratio
Triglycerides: 203 mg/dL — ABNORMAL HIGH (ref <150)
VLDL: 41 mg/dL — ABNORMAL HIGH (ref 0–40)

## 2011-12-13 LAB — T4, FREE: Free T4: 1.26 ng/dL (ref 0.80–1.80)

## 2011-12-13 LAB — TSH: TSH: 2.346 u[IU]/mL (ref 0.350–4.500)

## 2011-12-30 DIAGNOSIS — K5901 Slow transit constipation: Secondary | ICD-10-CM | POA: Diagnosis not present

## 2011-12-30 DIAGNOSIS — R1310 Dysphagia, unspecified: Secondary | ICD-10-CM | POA: Diagnosis not present

## 2012-01-02 ENCOUNTER — Ambulatory Visit (INDEPENDENT_AMBULATORY_CARE_PROVIDER_SITE_OTHER): Payer: Medicare Other | Admitting: Ophthalmology

## 2012-01-02 DIAGNOSIS — H43819 Vitreous degeneration, unspecified eye: Secondary | ICD-10-CM

## 2012-01-02 DIAGNOSIS — E1139 Type 2 diabetes mellitus with other diabetic ophthalmic complication: Secondary | ICD-10-CM

## 2012-01-02 DIAGNOSIS — E11319 Type 2 diabetes mellitus with unspecified diabetic retinopathy without macular edema: Secondary | ICD-10-CM

## 2012-01-02 DIAGNOSIS — H35039 Hypertensive retinopathy, unspecified eye: Secondary | ICD-10-CM

## 2012-01-02 DIAGNOSIS — H2510 Age-related nuclear cataract, unspecified eye: Secondary | ICD-10-CM

## 2012-01-02 DIAGNOSIS — I10 Essential (primary) hypertension: Secondary | ICD-10-CM

## 2012-01-09 ENCOUNTER — Encounter: Payer: Self-pay | Admitting: Internal Medicine

## 2012-01-09 ENCOUNTER — Ambulatory Visit (INDEPENDENT_AMBULATORY_CARE_PROVIDER_SITE_OTHER): Payer: Medicare Other | Admitting: Internal Medicine

## 2012-01-09 ENCOUNTER — Ambulatory Visit (INDEPENDENT_AMBULATORY_CARE_PROVIDER_SITE_OTHER): Payer: Medicare Other | Admitting: Dietician

## 2012-01-09 ENCOUNTER — Encounter: Payer: Self-pay | Admitting: Dietician

## 2012-01-09 VITALS — BP 141/74 | HR 77 | Temp 97.1°F | Ht 63.5 in | Wt 169.7 lb

## 2012-01-09 DIAGNOSIS — E11319 Type 2 diabetes mellitus with unspecified diabetic retinopathy without macular edema: Secondary | ICD-10-CM

## 2012-01-09 DIAGNOSIS — M1990 Unspecified osteoarthritis, unspecified site: Secondary | ICD-10-CM | POA: Diagnosis not present

## 2012-01-09 DIAGNOSIS — E119 Type 2 diabetes mellitus without complications: Secondary | ICD-10-CM

## 2012-01-09 DIAGNOSIS — E039 Hypothyroidism, unspecified: Secondary | ICD-10-CM | POA: Diagnosis not present

## 2012-01-09 DIAGNOSIS — E1065 Type 1 diabetes mellitus with hyperglycemia: Secondary | ICD-10-CM | POA: Diagnosis not present

## 2012-01-09 DIAGNOSIS — I10 Essential (primary) hypertension: Secondary | ICD-10-CM

## 2012-01-09 DIAGNOSIS — IMO0001 Reserved for inherently not codable concepts without codable children: Secondary | ICD-10-CM

## 2012-01-09 DIAGNOSIS — E1139 Type 2 diabetes mellitus with other diabetic ophthalmic complication: Secondary | ICD-10-CM

## 2012-01-09 LAB — GLUCOSE, CAPILLARY: Glucose-Capillary: 184 mg/dL — ABNORMAL HIGH (ref 70–99)

## 2012-01-09 MED ORDER — ACETAMINOPHEN-CODEINE #3 300-30 MG PO TABS: 1.0000 | ORAL_TABLET | Freq: Three times a day (TID) | ORAL | Status: DC | PRN

## 2012-01-09 NOTE — Patient Instructions (Addendum)
Follow up with diabetes educator today. Follow up with me in 1 month. Use Tylenol #3 as Rx for pain as needed.

## 2012-01-09 NOTE — Progress Notes (Signed)
Medical Nutrition Therapy:  Appt start time: 1030 end time:  1130.  Assessment:  Primary concerns today: Blood sugar control.  Insulin technique is okay, patient oscillates a bit on frequency of omitted  Metformin and Novolog doses, reports she never skips levemir. Has Prodigy meter which has poor accuaracy.   Usual eating pattern includes 2 meals and 1-2 snacks per day. Usual physical activity includes walks some at mall, cares for grandchildren.  24-hr recall: Takes levemir and Novolog and metformin B (9-10 AM)-  Oatmeal, 2% milk and tub margarine    Snk (2 PM)- apple, peanut butter crackers    Takes levemir and Novolog D ( 6-8 PM)- water, salmon cake, spinach Snk (9-10 PM)- 1/2 tangerine  Progress Towards Goal(s):  No progress.   Nutritional Diagnosis:  [place]-2.2 Altered nutrition-related laboratory As related to elevated glucose.  As evidenced by high hemoglobin A1C.    Intervention:  Nutrition education about meters and self monitoring. Nutrition education about timing of metformin with Levemir and Novolog in PM to assist her with remembering to take it.   Monitoring/Evaluation:  Dietary intake, exercise, blood sugars, and body weight prn.

## 2012-01-09 NOTE — Progress Notes (Signed)
Subjective:    Patient ID: [name], female    DOB: August 01, 1950, 61 y.o.   MRN: [id number]  HPI Presents for follow up. States she forgot her blood sugar log book today, but brought her meter.  She states she is checking her blood sugar "about 3 times per day".  In looking through her meter, her blood sugar is ranging between 230-403.  She states she also shares her meter with her son, which makes her control even more difficult to tell.  She states she has stopped using ibuprofen for joints pains, but tylenol is not quite helping.  She states she needs pain medication "maybe 1-2 times per day".  She states her pain is mostly in her knees and lower back, and has not worsened. She does not bring her medications with her today for me to see, but confirms she is taking all her medications and insulin doses as documented on her medication list. Denies CP, SOB, N/V/D.  Denies melena or hematochezia.  She has occasional constipation which is helped by dulcolax at times.  Denies dizziness.  Denies hypoglycemia or associated symptoms. She states she saw GI and her colonoscopy is going to be performed in January '14. She states she last saw her opthalmologist on Dec 5th, records pending and going to be obtained. She has not seen our diabetes educator like I asked her to, but will be seen today at 930 am.  I explained to her that she is on high doses of insulin, at least prescribed, with an uncontrolled A1C of 14%. By my calculations, she is on about 2 Units of insulin per kg and still not well controlled. She then admits she she does skip Novolog "once in a while".  She may be misinterpreting her insulin doses on her pens (she states she is using pens Rx by [facility]).   Review of Systems Complete 13 point review of systems is otherwise negative except for that stated in the HPI.    Objective:   Physical Exam Filed Vitals:   01/09/12 0830  BP: 141/74  Pulse: 77  Temp: 97.1 F (36.2 C)  GEN:  AAOx3, NAD. HEENT: EOMI, PERRLA, no adenopathy, no icterus. CV: S1S2, no m/r/g, RRR. PULM: CTA bilat. ABD/GI: Soft, NT, +BS, no guarding, no distention, no HSM, no palpable masses. UE/LE: 2/4 pulses. Foot exam without lesions or ulcers. Sensation on bilat feet intact to monofilament testing. NEURO: CN II-XII intact, no focal deficits.     Assessment & Plan:  61 yr. 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 1) IDDM type 2: Her HgbA1C is 14%, this is very poor control. She states she is taking Levemir 50 units twice daily and Novolog 20 units three times daily; a total of 160 units of insulin daily.  Her weight is 169 lbs, or ~76 kg and with a total of 160 units of insulin daily her A1C should not be 14%.  She may be having problems seeing her doses and with technique, she needs to visit our diabetes educator. I find this dosing and poor control hard to believe.  She will see our diabetes educator and follow up with me in 1 month. She will need to check her blood sugar three times daily. 2) Possible CKD 3: No microalbuminuria, but noted  Cr 1.19, which is improved from prior. She has hx of chronic use of NSAIDs. I will repeat her Cr on next visit and see trend. 3) OA:  I will give her Tylenol #3 to use PRN Q8hrs. 3) Diabetic retinopathy: Continue to follow up with current ophthalmologist, need records 4) Glaucoma: Continue current tx, continue to follow with ophthalmologist, need records. 5) HTN: Mostly well controlled, today is higher than last visit. No changes for now. I need to see all of her medications in person.  6) HL: LDL 74. LFTs WNL. On pravastatin.  7) Hypothyroidism: FT4 and TSH WNL. Cont current dose of synthroid.  8) Depression:  States effexor is helping her. Denies suicidal thoughts or ideation. No changes today.  9) Health Maintenance: Colonoscopy pending. She is following with opthalmology. States she's had flu vaccine this year at [facility] (9/13). States she's had pneumovax already. She is following with Dr. Cyndie Chime for hx breast CA, no recurrence in 14 years. Follow up in 1 month.  Jonah Blue

## 2012-01-16 ENCOUNTER — Telehealth: Payer: Self-pay | Admitting: *Deleted

## 2012-01-16 NOTE — Telephone Encounter (Signed)
Received call from pt stating that she needs a script for her mastectomy supplies for January 2014 to go to [facility] [place].  The fax # is [phone].  Script on Dr [employer].

## 2012-01-17 ENCOUNTER — Telehealth: Payer: Self-pay | Admitting: *Deleted

## 2012-01-17 NOTE — Telephone Encounter (Signed)
Faxed order to [facility] for Right mastectomy supplies for Jan 2014 with DX 174.9 per Dr Cyndie Chime.

## 2012-02-06 ENCOUNTER — Ambulatory Visit: Payer: Medicare Other | Admitting: Internal Medicine

## 2012-02-06 ENCOUNTER — Other Ambulatory Visit: Payer: Self-pay | Admitting: Gastroenterology

## 2012-02-06 DIAGNOSIS — K222 Esophageal obstruction: Secondary | ICD-10-CM | POA: Diagnosis not present

## 2012-02-06 DIAGNOSIS — R1310 Dysphagia, unspecified: Secondary | ICD-10-CM | POA: Diagnosis not present

## 2012-02-06 DIAGNOSIS — D126 Benign neoplasm of colon, unspecified: Secondary | ICD-10-CM | POA: Diagnosis not present

## 2012-02-27 ENCOUNTER — Ambulatory Visit: Payer: Medicare Other | Admitting: Internal Medicine

## 2012-03-04 ENCOUNTER — Other Ambulatory Visit: Payer: Self-pay | Admitting: *Deleted

## 2012-03-04 MED ORDER — CLONIDINE HCL 0.2 MG PO TABS: 0.2000 mg | ORAL_TABLET | Freq: Every day | ORAL | Status: DC

## 2012-03-06 ENCOUNTER — Other Ambulatory Visit: Payer: Self-pay | Admitting: *Deleted

## 2012-03-09 MED ORDER — TRIAMTERENE-HCTZ 37.5-25 MG PO TABS: 0.5000 | ORAL_TABLET | Freq: Every day | ORAL | Status: DC

## 2012-03-17 DIAGNOSIS — M79609 Pain in unspecified limb: Secondary | ICD-10-CM | POA: Diagnosis not present

## 2012-03-26 ENCOUNTER — Encounter: Payer: Self-pay | Admitting: Internal Medicine

## 2012-03-26 ENCOUNTER — Ambulatory Visit (INDEPENDENT_AMBULATORY_CARE_PROVIDER_SITE_OTHER): Payer: Medicare Other | Admitting: Internal Medicine

## 2012-03-26 VITALS — BP 102/65 | HR 73 | Temp 97.0°F | Ht 63.5 in | Wt 162.5 lb

## 2012-03-26 DIAGNOSIS — E119 Type 2 diabetes mellitus without complications: Secondary | ICD-10-CM

## 2012-03-26 DIAGNOSIS — M545 Low back pain, unspecified: Secondary | ICD-10-CM

## 2012-03-26 DIAGNOSIS — E785 Hyperlipidemia, unspecified: Secondary | ICD-10-CM

## 2012-03-26 DIAGNOSIS — E1129 Type 2 diabetes mellitus with other diabetic kidney complication: Secondary | ICD-10-CM | POA: Diagnosis not present

## 2012-03-26 DIAGNOSIS — G8929 Other chronic pain: Secondary | ICD-10-CM | POA: Diagnosis not present

## 2012-03-26 DIAGNOSIS — N058 Unspecified nephritic syndrome with other morphologic changes: Secondary | ICD-10-CM

## 2012-03-26 DIAGNOSIS — E1121 Type 2 diabetes mellitus with diabetic nephropathy: Secondary | ICD-10-CM

## 2012-03-26 LAB — POCT GLYCOSYLATED HEMOGLOBIN (HGB A1C): Hemoglobin A1C: 12.1

## 2012-03-26 LAB — GLUCOSE, CAPILLARY: Glucose-Capillary: 142 mg/dL — ABNORMAL HIGH (ref 70–99)

## 2012-03-26 MED ORDER — INSULIN DETEMIR 100 UNIT/ML ~~LOC~~ SOLN: 30.0000 [IU] | Freq: Every day | SUBCUTANEOUS | Status: DC

## 2012-03-26 MED ORDER — ACETAMINOPHEN 325 MG PO TABS: 325.0000 mg | ORAL_TABLET | Freq: Three times a day (TID) | ORAL | Status: DC | PRN

## 2012-03-26 MED ORDER — INSULIN ASPART 100 UNIT/ML ~~LOC~~ SOLN: 8.0000 [IU] | Freq: Three times a day (TID) | SUBCUTANEOUS | Status: DC

## 2012-03-26 MED ORDER — PRAVASTATIN SODIUM 40 MG PO TABS: 40.0000 mg | ORAL_TABLET | Freq: Every day | ORAL | Status: DC

## 2012-03-26 NOTE — Patient Instructions (Addendum)
REMEMBER your insulin dose changes: Take Levemir 30 units at night and Novolog 8 units before meals. Check your blood sugar four times daily for now: in morning before breakfast, before lunch, before dinner, before bed. Return in 6 weeks.

## 2012-03-26 NOTE — Progress Notes (Signed)
Subjective:    Patient ID: [name], female    DOB: 12-16-1950, 62 y.o.   MRN: [id number]  HPI States she has forgotten to write down her blood sugars.  She states she has had some difficult situations, including sister in law having open heart surgery and her daughter being hospitalized for an abscess drainage. She also states her dog passed away. She brought her BG meter and upon my review, it shows mostly 200-300 readings, with some 400 readings. She states she had one hypoglycemic episode of 59 recently, but this is the only one.  This resolved quickly with a meal. She states she forgets to take her Levimir insulin about twice weekly.  She also forgets to take her meal time insulin a few times a week.  Review of Systems Complete 12 point review of systems is otherwise negative except for that stated in the HPI.    Objective:   Physical Exam Filed Vitals:   03/26/12 1018  BP: 102/65  Pulse: 73  Temp: 97 F (36.1 C)   GEN: AAOx3, NAD HEENT: EOMI, PERRLA, no icterus, no adenopathy. CV: S1S2, no m/r/g, RRR PULM; CTA bilat. ABD/GI: Soft, NT, +BS, no guarding, no distention. LE/UE: 2/4 pulses, no c/c/e. No lesions. NEURO: CN II-XII intact, no focal deficits.     Assessment & Plan:  61 yr. 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  1) IDDM type 2: She states her hypoglycemic episode was in the morning around 6 am.  She states she is having difficulty with injecting her insulin so many times a day, and this is why she is skipping it often.  She is asking to simply her regimen.  As it is listed, she is supposed to be taking 100 units of basal daily and 60 units of novolog daily, for a total of 160 units a day.  If we use 1 U/kg,  she would need to be on 73 Units total per day. I think we have been raising her insulin assuming she is always taking it, but this is not the case as she admits today.  She agrees and understands she must take the doses I say today, and is to report low blood sugars to me immediately. She knows what to do if low blood sugar occurs. Change Levemir 30 units once nightly.  Change Novolog to 8 units with meals.  Check blood sugars  before breakfast, before lunch, before dinner, before bed. 2) Possible CKD 3: No microalbuminuria, but noted Cr 1.19, which is improved from prior. She has hx of chronic use of NSAIDs but has stopped this as I had indicted on her previous visit. 3) OA: Tylenol #3  PRN Q8hrs is making her too sleepy and she is missing her insulin, she would like to stop this.  Tylenol 325 mg po q8hrs pain prn. 3) Diabetic retinopathy: Continue to follow up with current ophthalmologist. 4) Glaucoma: Continue current tx, continue to follow with ophthalmologist. 5) HTN: Mostly well controlled, today is higher than last visit. No changes for now. I need to see all of her medications in person.  6) HL: Increase pravastatin to 40 mg daily. 7) Hypothyroidism: FT4 and TSH WNL. Cont current dose of synthroid.  8) Depression: States effexor is helping her. Denies suicidal thoughts or  ideation. No changes today.  9) Health Maintenance: Colonoscopy performed with tubulovillous adenoma in Jan 2014. She is following with opthalmology. States she's had flu vaccine [facility] at [facility] (9/13). States she's had pneumovax already. She is following with Dr. Cyndie Chime for hx breast CA, no recurrence in 14 years.  Follow up in 6 weeks.

## 2012-04-06 DIAGNOSIS — I8310 Varicose veins of unspecified lower extremity with inflammation: Secondary | ICD-10-CM | POA: Diagnosis not present

## 2012-05-27 DIAGNOSIS — H4011X0 Primary open-angle glaucoma, stage unspecified: Secondary | ICD-10-CM | POA: Diagnosis not present

## 2012-05-27 DIAGNOSIS — E119 Type 2 diabetes mellitus without complications: Secondary | ICD-10-CM | POA: Diagnosis not present

## 2012-05-27 DIAGNOSIS — H409 Unspecified glaucoma: Secondary | ICD-10-CM | POA: Diagnosis not present

## 2012-05-27 DIAGNOSIS — E11311 Type 2 diabetes mellitus with unspecified diabetic retinopathy with macular edema: Secondary | ICD-10-CM | POA: Diagnosis not present

## 2012-05-28 ENCOUNTER — Ambulatory Visit (HOSPITAL_COMMUNITY)
Admission: RE | Admit: 2012-05-28 | Discharge: 2012-05-28 | Disposition: A | Discharge status: Home / Self Care | Payer: Medicare Other | Source: Ambulatory Visit | Attending: Internal Medicine | Admitting: Internal Medicine

## 2012-05-28 ENCOUNTER — Ambulatory Visit (INDEPENDENT_AMBULATORY_CARE_PROVIDER_SITE_OTHER): Payer: Medicare Other | Admitting: Internal Medicine

## 2012-05-28 ENCOUNTER — Encounter: Payer: Self-pay | Admitting: Internal Medicine

## 2012-05-28 VITALS — BP 115/72 | HR 78 | Temp 98.5°F | Ht 63.5 in | Wt 161.4 lb

## 2012-05-28 DIAGNOSIS — G8929 Other chronic pain: Secondary | ICD-10-CM | POA: Diagnosis not present

## 2012-05-28 DIAGNOSIS — M79609 Pain in unspecified limb: Secondary | ICD-10-CM | POA: Insufficient documentation

## 2012-05-28 DIAGNOSIS — M545 Low back pain, unspecified: Secondary | ICD-10-CM | POA: Diagnosis not present

## 2012-05-28 DIAGNOSIS — E1129 Type 2 diabetes mellitus with other diabetic kidney complication: Secondary | ICD-10-CM | POA: Diagnosis not present

## 2012-05-28 DIAGNOSIS — E785 Hyperlipidemia, unspecified: Secondary | ICD-10-CM | POA: Diagnosis not present

## 2012-05-28 DIAGNOSIS — E119 Type 2 diabetes mellitus without complications: Secondary | ICD-10-CM | POA: Diagnosis not present

## 2012-05-28 DIAGNOSIS — E1121 Type 2 diabetes mellitus with diabetic nephropathy: Secondary | ICD-10-CM

## 2012-05-28 DIAGNOSIS — N058 Unspecified nephritic syndrome with other morphologic changes: Secondary | ICD-10-CM

## 2012-05-28 DIAGNOSIS — M79602 Pain in left arm: Secondary | ICD-10-CM

## 2012-05-28 LAB — GLUCOSE, CAPILLARY: Glucose-Capillary: 170 mg/dL — ABNORMAL HIGH (ref 70–99)

## 2012-05-28 LAB — POCT GLYCOSYLATED HEMOGLOBIN (HGB A1C): Hemoglobin A1C: >14

## 2012-05-28 MED ORDER — INSULIN DETEMIR 100 UNIT/ML FLEXPEN: 50.0000 [IU] | PEN_INJECTOR | Freq: Every morning | SUBCUTANEOUS | Status: DC

## 2012-05-28 MED ORDER — INSULIN ASPART 100 UNIT/ML FLEXPEN: 15.0000 [IU] | PEN_INJECTOR | Freq: Three times a day (TID) | SUBCUTANEOUS | Status: DC

## 2012-05-28 MED ORDER — INSULIN DETEMIR 100 UNIT/ML ~~LOC~~ SOLN: 50.0000 [IU] | Freq: Every day | SUBCUTANEOUS | Status: DC

## 2012-05-28 MED ORDER — INSULIN ASPART 100 UNIT/ML ~~LOC~~ SOLN: 15.0000 [IU] | Freq: Three times a day (TID) | SUBCUTANEOUS | Status: DC

## 2012-05-28 MED ORDER — GLUCOSE 4 G PO CHEW: 16.0000 g | CHEWABLE_TABLET | ORAL | Status: AC | PRN

## 2012-05-28 NOTE — Progress Notes (Signed)
  Subjective:    Patient ID: [name], female    DOB: 11/10/1950, 62 y.o.   MRN: [id number]  HPI Presents today for follow up.  I have reviewed her BG log, and now that we have established the need to take it everyday, it is still uncontrolled.  She is above target all the time. She states she is taking her insulin as prescribed, but seems hesitant to go into more detail.  Denies substernal CP. States at times she gets a throbbing pain in her left arm, such as at this time. Denies diaphoresis, dizziness, N/V. Denies any CP with exertion. EKG was performed. NSR no acute ST changes. Pain was reproducible on palpation of proximal left arm   Review of Systems Complete 12 point review of systems otherwise negative except for that stated in the HPI.    Objective:   Physical Exam Filed Vitals:   05/28/12 1018  BP: 115/72  Pulse: 78  Temp: 98.5 F (36.9 C)   GEN: AAOx3, NAD. HEENT: EOMI, PERRLA, no icterus. CV: S1S2, no m/r/g, RRR. PULM: CTA bilat. ABD/GI: Soft, NT, +BS, no guarding. LE/UE: 2/3 pulses, no c/c/e. No lesions. No ulcers. The pain in her arm is reproducible by extending her arm (L) and by palpating proximal muscle groups. NEURO: CN II-XII intact, no focal deficits.      Assessment & Plan:  62 yr. 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  1) IDDM type 2: HgbA1C is not controlled, it is over 14%.  I have emphasized the need to never skip insulin.  I am going to increase her dose of insulin and remind her to check her BG four times a day. Her BG log shows her BG above target 100% of the time. Increase to ~1.5 U/kg TDI.  Her weight is 73 kg. Levemir 50 units in the morning, Novolog 15 units with meals. Check BG four times  a day (before breakfast, before lunch, before dinner, before bedtime).  Her meter only gives me 26 readings, states that it's because she just got a new meter. The old meter would of been helpful to bring as well.  She will need close follow up. I will have her come back in two weeks. I have extensively educated her on hypoglycemia.  2) CKD 3: No microalbuminuria. Stable. 3) OA: Tylenol 325 mg po q8hrs pain prn.  3) Diabetic retinopathy: Continue to follow up with current ophthalmologist.  4) Glaucoma: Continue current tx, continue to follow with ophthalmologist.  5) HTN: Better controlled. Making progress. 6) HL: Pravastatin to 40 mg daily. 10 year risk is 11.9%.  7) Hypothyroidism: FT4 and TSH WNL. Cont current dose of synthroid.  8) Depression: States effexor is helping her. Denies suicidal thoughts or ideation. No changes today.  9) Atypical CP: Pain is reproducible. EKG without changes. Likely musculoskeletal. I have advised her to inform me if she gets substernal CP or this pain worsens in intensity. 9) Health Maintenance: Colonoscopy performed with tubulovillous adenoma in Jan 2014. She is following with opthalmology. States she's had flu vaccine [facility] at [facility] (9/13). States she's had pneumovax already. She is following with Dr. Cyndie Chime for hx breast CA, no recurrence in 14 years.   Follow up in 2 weeks.

## 2012-05-28 NOTE — Patient Instructions (Addendum)
Increase Levemir to 50 units in the morning. Increase Novolog to 15 units before meals. If your blood sugar drops below 70, eat something sweet such as orange juice or cookies and call me right away. I have also sent sugar tablets to your pharmacy to use if needed.  Return in 2 weeks.

## 2012-06-11 ENCOUNTER — Encounter: Payer: Self-pay | Admitting: Internal Medicine

## 2012-06-11 ENCOUNTER — Ambulatory Visit (INDEPENDENT_AMBULATORY_CARE_PROVIDER_SITE_OTHER): Payer: Medicare Other | Admitting: Internal Medicine

## 2012-06-11 VITALS — BP 123/75 | HR 79 | Temp 98.4°F | Ht 63.5 in | Wt 160.3 lb

## 2012-06-11 DIAGNOSIS — Z9119 Patient's noncompliance with other medical treatment and regimen: Secondary | ICD-10-CM | POA: Diagnosis not present

## 2012-06-11 DIAGNOSIS — E039 Hypothyroidism, unspecified: Secondary | ICD-10-CM | POA: Diagnosis not present

## 2012-06-11 DIAGNOSIS — E119 Type 2 diabetes mellitus without complications: Secondary | ICD-10-CM | POA: Diagnosis not present

## 2012-06-11 LAB — GLUCOSE, CAPILLARY: Glucose-Capillary: 273 mg/dL — ABNORMAL HIGH (ref 70–99)

## 2012-06-11 NOTE — Progress Notes (Signed)
  Subjective:    Patient ID: [name], female    DOB: March 09, 1950, 62 y.o.   MRN: [id number]  HPI States she had one episode of hypoglycemia, down to 62 after taking her meal time insulin.   I reviewed her BG readings and she had one BG of 62 before dinner on 06/07/12. Her fasting BG in the morning since our last visit vary from 215-534. She admits she has times in which she forgets her basal insulin in the morning. She feels she would remember Levemir better if she took it at night. She hesitates when I ask if she varies her meal time insulin or forgets. I am going to refer again to our diabetes educator.  Review of Systems Complete 12 point review of systems otherwise negative except for that stated in the HPI.    Objective:   Physical Exam Filed Vitals:   06/11/12 0830  BP: 123/75  Pulse: 79  Temp: 98.4 F (36.9 C)   GEN: AAOx3, NAD. HEENT: EOMI, PERRL, no icterus, no adenopathy. CV: S1S2, no m/r/g, RRR PULM: CTA bilat.       Assessment & Plan:  61 yr. 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  1) IDDM type 2: She is skipping doses of Levemir, her readings are almost universally very high. She states she never skips meal time insulin, but hesitates. I will need to have her see our diabetes educator.  For now, Levemir at night at current dose. I have advised her to only take the full 15 units of novolog if she eats a full meal, otherwise take about half the dose. I am very concerned about her understanding and insight into her diabetes treatment. Return in 3 weeks. Report hypoglycemia to me immediately. Instructions verbally given on how to deal with hypoglycemia.

## 2012-06-11 NOTE — Patient Instructions (Addendum)
I will need you to see our diabetes educator. Change your Levemir dosing to night time. Remember my instructions on hypoglycemia: glucose tablets. Return in 3 weeks.

## 2012-06-12 DIAGNOSIS — Z9119 Patient's noncompliance with other medical treatment and regimen: Secondary | ICD-10-CM | POA: Insufficient documentation

## 2012-06-12 MED ORDER — LEVOTHYROXINE SODIUM 88 MCG PO TABS: 88.0000 ug | ORAL_TABLET | Freq: Every day | ORAL | Status: DC

## 2012-06-23 ENCOUNTER — Encounter: Payer: Medicare Other | Admitting: Dietician

## 2012-07-02 ENCOUNTER — Ambulatory Visit (INDEPENDENT_AMBULATORY_CARE_PROVIDER_SITE_OTHER): Payer: Medicare Other | Admitting: Ophthalmology

## 2012-07-02 DIAGNOSIS — E1165 Type 2 diabetes mellitus with hyperglycemia: Secondary | ICD-10-CM | POA: Diagnosis not present

## 2012-07-02 DIAGNOSIS — H2510 Age-related nuclear cataract, unspecified eye: Secondary | ICD-10-CM

## 2012-07-02 DIAGNOSIS — E1139 Type 2 diabetes mellitus with other diabetic ophthalmic complication: Secondary | ICD-10-CM

## 2012-07-02 DIAGNOSIS — I10 Essential (primary) hypertension: Secondary | ICD-10-CM | POA: Diagnosis not present

## 2012-07-02 DIAGNOSIS — H35039 Hypertensive retinopathy, unspecified eye: Secondary | ICD-10-CM

## 2012-07-02 DIAGNOSIS — E11319 Type 2 diabetes mellitus with unspecified diabetic retinopathy without macular edema: Secondary | ICD-10-CM | POA: Diagnosis not present

## 2012-07-03 ENCOUNTER — Encounter: Payer: Self-pay | Admitting: Internal Medicine

## 2012-07-03 ENCOUNTER — Ambulatory Visit (INDEPENDENT_AMBULATORY_CARE_PROVIDER_SITE_OTHER): Payer: Medicare Other | Admitting: Internal Medicine

## 2012-07-03 VITALS — BP 100/58 | HR 95 | Temp 97.9°F | Ht 63.0 in | Wt 164.9 lb

## 2012-07-03 DIAGNOSIS — Z91199 Patient's noncompliance with other medical treatment and regimen due to unspecified reason: Secondary | ICD-10-CM

## 2012-07-03 DIAGNOSIS — M545 Low back pain, unspecified: Secondary | ICD-10-CM | POA: Diagnosis not present

## 2012-07-03 DIAGNOSIS — E119 Type 2 diabetes mellitus without complications: Secondary | ICD-10-CM

## 2012-07-03 DIAGNOSIS — E1129 Type 2 diabetes mellitus with other diabetic kidney complication: Secondary | ICD-10-CM | POA: Diagnosis not present

## 2012-07-03 DIAGNOSIS — E785 Hyperlipidemia, unspecified: Secondary | ICD-10-CM

## 2012-07-03 DIAGNOSIS — Z9119 Patient's noncompliance with other medical treatment and regimen: Secondary | ICD-10-CM | POA: Diagnosis not present

## 2012-07-03 DIAGNOSIS — M79609 Pain in unspecified limb: Secondary | ICD-10-CM

## 2012-07-03 DIAGNOSIS — G8929 Other chronic pain: Secondary | ICD-10-CM | POA: Diagnosis not present

## 2012-07-03 DIAGNOSIS — M79602 Pain in left arm: Secondary | ICD-10-CM

## 2012-07-03 DIAGNOSIS — N058 Unspecified nephritic syndrome with other morphologic changes: Secondary | ICD-10-CM | POA: Diagnosis not present

## 2012-07-03 DIAGNOSIS — E1121 Type 2 diabetes mellitus with diabetic nephropathy: Secondary | ICD-10-CM

## 2012-07-03 LAB — GLUCOSE, CAPILLARY: Glucose-Capillary: 82 mg/dL (ref 70–99)

## 2012-07-03 MED ORDER — METFORMIN HCL 1000 MG PO TABS: 1000.0000 mg | ORAL_TABLET | Freq: Two times a day (BID) | ORAL | Status: DC

## 2012-07-03 MED ORDER — INSULIN DETEMIR 100 UNIT/ML FLEXPEN: 60.0000 [IU] | PEN_INJECTOR | Freq: Every morning | SUBCUTANEOUS | Status: DC

## 2012-07-03 MED ORDER — VENLAFAXINE HCL ER 75 MG PO CP24: 75.0000 mg | ORAL_CAPSULE | Freq: Every day | ORAL | Status: DC

## 2012-07-03 NOTE — Progress Notes (Signed)
Patient ID: [name], female   DOB: December 03, 1950, 62 y.o.   MRN: [id number]  Subjective:   Patient ID: [name] female   DOB: Feb 25, 1950 62 y.o.   MRN: [id number]  HPI: [name] is a 62 y.o. female presenting for F/U of T2DM. Has been difficult to control her hyperglycemia due to intermittent medication compliance and concerns for hypoglycemia in the past. Currently on 11 year 50 Units at night and is supposed to be taking NovoLog 15 units 3 times a day with meals as well as metformin 1000 mg twice a day. She reports consistent compliance with Levemir at night, but misses up to 1/3-1/2 doses of her NovoLog and metformin. She brought in her glucometer which shows persistent hyperglycemia throughout entire day, fasting and postprandially. Most blood sugars range 200-500. Lowest value of 69 at 8 PM one day. No a.m. hypoglycemic episodes. She was supposed to followup with Norm Parcel after her last visit with PCP in a few weeks ago, but did not come to her appointment due to family issues. She says she sometimes feels weak and shaky at home and takes a snack when she feels this way. When asked about reasons for medication nonadherence, she says sometimes it is related to having to take care of grandchildren and family issues.   Past Medical History  Diagnosis Date  . Allergy   . Anemia   . Depression   . Arthritis   . Cancer   . Cataract   . Diabetes mellitus without complication   . Glaucoma   . Hyperlipidemia   . Hypertension   . Chronic kidney disease   . Neuromuscular disorder   . Thyroid disease    Current Outpatient Prescriptions  Medication Sig Dispense Refill  . acetaminophen (TYLENOL) 325 MG tablet Take 1 tablet (325 mg total) by mouth every 8 (eight) hours as needed for pain.  100 tablet  2  . B-D ULTRAFINE III SHORT PEN 31G X 8 MM MISC       . cloNIDine (CATAPRES) 0.2 MG tablet Take 1 tablet (0.2 mg total) by mouth daily.  30 tablet  2  . cycloSPORINE (RESTASIS)  0.05 % ophthalmic emulsion Place 1 drop into both eyes 2 (two) times daily.      [name] glucose 4 GM chewable tablet Chew 4 tablets (16 g total) by mouth as needed for low blood sugar.  50 tablet  12  . ibuprofen (ADVIL,MOTRIN) 800 MG tablet       . insulin aspart (NOVOLOG FLEXPEN) 100 unit/mL SOLN FlexPen Inject 15 Units into the skin 3 (three) times daily with meals.  18 mL  3  . Insulin Detemir (LEVEMIR FLEXPEN) 100 UNIT/ML SOPN Inject 60 Units into the skin every morning.  21 mL  3  . ketorolac (ACULAR) 0.4 % SOLN Place 1 drop into the left eye 3 (three) times daily.      [name] levothyroxine (SYNTHROID, LEVOTHROID) 88 MCG tablet Take 1 tablet (88 mcg total) by mouth daily.  30 tablet  3  . metFORMIN (GLUCOPHAGE) 1000 MG tablet Take 1 tablet (1,000 mg total) by mouth 2 (two) times daily with a meal.  60 tablet  5  . olopatadine (PATANOL) 0.1 % ophthalmic solution 1 drop 2 (two) times daily as needed.      . pravastatin (PRAVACHOL) 40 MG tablet Take 1 tablet (40 mg total) by mouth daily.  30 tablet  3  . Travoprost, BAK Free, (TRAVATAN) 0.004 % SOLN ophthalmic solution 1  drop at bedtime.      . triamterene-hydrochlorothiazide (MAXZIDE-25) 37.5-25 MG per tablet Take 0.5 each (0.5 tablets total) by mouth daily.  45 tablet  1  . venlafaxine XR (EFFEXOR-XR) 75 MG 24 hr capsule Take 1 capsule (75 mg total) by mouth daily.  30 capsule  5   No current facility-administered medications for this visit.   Family History  Problem Relation Age of Onset  . Heart disease Father   . Diabetes Father   . Stroke Sister   . Anuerysm Sister   . Heart disease Brother   . Anuerysm Brother    History   Social History  . Marital Status: Divorced    Spouse Name: N/A    Number of Children: N/A  . Years of Education: N/A   Social History Main Topics  . Smoking status: Never Smoker   . Smokeless tobacco: None  . Alcohol Use: No  . Drug Use: No  . Sexually Active: No   Other Topics Concern  . None   Social  History Narrative  . None   Review of Systems: 10 pt ROS performed, pertinent positives and negatives noted in HPI Objective:  Physical Exam: Filed Vitals:   07/03/12 0924  BP: 100/58  Pulse: 95  Temp: 97.9 F (36.6 C)  TempSrc: Oral  Height: 5\' 3"  (1.6 m)  Weight: 164 lb 14.4 oz (74.798 kg)  SpO2: 100%   Vitals reviewed. General: sitting in chair, NAD HEENT: PERRL, EOMI, no scleral icterus Cardiac: RRR, no rubs, murmurs or gallops Pulm: CTAB Abd: soft, nontender, nondistended, BS present Ext: warm and well perfused, no pedal edema Neuro: alert and oriented X3, cranial nerves II-XII grossly intact, strength and sensation to light touch equal in bilateral upper and lower extremities  Assessment & Plan:   Please see problem-based charting for assessment and plan.

## 2012-07-03 NOTE — Patient Instructions (Signed)
1. Increase night levemir to 60 units. 2. Remember to take your novolog with every meal - 15 units. 3. Please take your metformin twice a day every day.  Schedule an appointment to meet w Lupita Leash Plyler next week.

## 2012-07-03 NOTE — Assessment & Plan Note (Signed)
Lab Results  Component Value Date   HGBA1C >14.0 05/28/2012   HGBA1C 12.1 03/26/2012   HGBA1C 14.0 12/12/2011     Assessment: Diabetes control: poor control (HgbA1C >9%) Progress toward A1C goal:  deteriorated Comments: Intermittent compliance  Plan: Medications:  Increase Levemir to 60 units at night. Urged compliance with 3 times a day NovoLog 15 units and twice a day metformin Home glucose monitoring: Frequency: 4 times a day Timing: before breakfast;after dinner Instruction/counseling given: reminded to bring blood glucose meter & log to each visit Educational resources provided: brochure Self management tools provided: copy of home glucose meter download Other plans: To make appt w Lupita Leash Plyler early next week

## 2012-07-06 NOTE — Progress Notes (Signed)
Case discussed with Dr. Ziemer immediately after the resident saw the patient.  We reviewed the residens history and exam and pertinent patient test results.  I agree with the assessment, diagnosis and plan of care documented in the resident's note. 

## 2012-07-09 ENCOUNTER — Ambulatory Visit: Payer: Medicare Other | Admitting: Dietician

## 2012-07-27 ENCOUNTER — Telehealth: Payer: Self-pay | Admitting: *Deleted

## 2012-07-27 NOTE — Telephone Encounter (Signed)
Pt called states past 2 months pain left shoulder and neck are. Past week pain is getting worse in that area  - movement or no movement. Appt made for this week - C. Boone to call pt with appt. [facility]ney Miquel Lamson RN 07/27/12 11AM

## 2012-07-28 ENCOUNTER — Ambulatory Visit (INDEPENDENT_AMBULATORY_CARE_PROVIDER_SITE_OTHER): Payer: Medicare Other | Admitting: Internal Medicine

## 2012-07-28 ENCOUNTER — Ambulatory Visit (HOSPITAL_COMMUNITY)
Admission: RE | Admit: 2012-07-28 | Discharge: 2012-07-28 | Disposition: A | Discharge status: Home / Self Care | Payer: Medicare Other | Source: Ambulatory Visit | Attending: Internal Medicine | Admitting: Internal Medicine

## 2012-07-28 ENCOUNTER — Encounter: Payer: Self-pay | Admitting: Internal Medicine

## 2012-07-28 VITALS — BP 124/75 | HR 85 | Temp 98.7°F | Ht 63.0 in | Wt 163.2 lb

## 2012-07-28 DIAGNOSIS — M25519 Pain in unspecified shoulder: Secondary | ICD-10-CM | POA: Insufficient documentation

## 2012-07-28 DIAGNOSIS — M79609 Pain in unspecified limb: Secondary | ICD-10-CM

## 2012-07-28 DIAGNOSIS — I10 Essential (primary) hypertension: Secondary | ICD-10-CM | POA: Diagnosis not present

## 2012-07-28 DIAGNOSIS — Z853 Personal history of malignant neoplasm of breast: Secondary | ICD-10-CM | POA: Diagnosis not present

## 2012-07-28 DIAGNOSIS — N644 Mastodynia: Secondary | ICD-10-CM | POA: Diagnosis not present

## 2012-07-28 DIAGNOSIS — E119 Type 2 diabetes mellitus without complications: Secondary | ICD-10-CM

## 2012-07-28 DIAGNOSIS — M542 Cervicalgia: Secondary | ICD-10-CM | POA: Insufficient documentation

## 2012-07-28 DIAGNOSIS — M79602 Pain in left arm: Secondary | ICD-10-CM

## 2012-07-28 DIAGNOSIS — E785 Hyperlipidemia, unspecified: Secondary | ICD-10-CM | POA: Insufficient documentation

## 2012-07-28 DIAGNOSIS — Z794 Long term (current) use of insulin: Secondary | ICD-10-CM | POA: Diagnosis not present

## 2012-07-28 LAB — GLUCOSE, CAPILLARY: Glucose-Capillary: 111 mg/dL — ABNORMAL HIGH (ref 70–99)

## 2012-07-28 MED ORDER — IBUPROFEN 600 MG PO TABS: 600.0000 mg | ORAL_TABLET | Freq: Four times a day (QID) | ORAL | Status: DC | PRN

## 2012-07-28 NOTE — Progress Notes (Signed)
Case discussed with Dr. Qureshi (at time of visit, soon after the resident saw the patient).  We reviewed the resident's history and exam and pertinent patient test results.  I agree with the assessment, diagnosis, and plan of care documented in the resident's note.  

## 2012-07-28 NOTE — Patient Instructions (Signed)
General Instructions: Please follow up with  Your mammogram and sports medicine referral  Please try taking ibuprofen for pain and see if that helps  Return to clinic in 2 weeks for follow up and we will check your kidney function then   Treatment Goals:  Goals (1 Years of Data) as of 07/28/12         As of Today 07/03/12 06/11/12 05/28/12 03/26/12     Blood Pressure    . Blood Pressure < 140/90  124/75 100/58 123/75 115/72 102/65     Result Component    . HEMOGLOBIN A1C < 7.0     >14.0 12.1    . LDL CALC < 100            Progress Toward Treatment Goals:  Treatment Goal 07/28/2012  Hemoglobin A1C unable to assess    Self Care Goals & Plans:  Self Care Goal 07/28/2012  Manage my medications take my medicines as prescribed; bring my medications to every visit  Monitor my health keep track of my blood glucose; bring my glucose meter and log to each visit  Eat healthy foods -  Be physically active -    Home Blood Glucose Monitoring 07/28/2012  Check my blood sugar 3 times a day  When to check my blood sugar before meals     Care Management & Community Referrals:  Referral 07/28/2012  Referrals made for care management support diabetes educator  Referrals made to community resources -

## 2012-07-28 NOTE — Assessment & Plan Note (Signed)
Lab Results  Component Value Date   HGBA1C >14.0 05/28/2012   HGBA1C 12.1 03/26/2012   HGBA1C 14.0 12/12/2011    Assessment: Diabetes control: poor control (HgbA1C >9%) Progress toward A1C goal:  unable to assess Comments: has not been adhering to regimen and has been giving herself less insulin at times based on what she thinks she needs.  Lowest cbg of 63 on recent report with average of 238 and highest of 514.  Plan: Medications:  continue current medications, levemir flex pen 60 units qam and novolog 15 units TID with meals, and metformin  Home glucose monitoring: Frequency: 3 times a day Timing: before meals Instruction/counseling given: reminded to get eye exam, reminded to bring blood glucose meter & log to each visit, reminded to bring medications to each visit, discussed foot care and discussed diet Educational resources provided: brochure Self management tools provided: copy of home glucose meter download Other plans: difficult to assess if current regimen started on 07/03/12 is improving since she has not been adhering to it and usually endorses given less than prescribed amount.  Referred to [facility] plyler again (missed last visit), needs education and diet control. Encouraged compliance again.

## 2012-07-28 NOTE — Progress Notes (Signed)
Subjective:   Patient ID: [name] female   DOB: 03-Sep-1950 62 y.o.   MRN: [id number]  HPI: [name] is a 62 y.o. 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  She explains the pain is intermittent and chronic since February but recently worsening over the past few days.  She denies any trauma or injury to the area or any recent falls.  She does endorse radiation of the pain up to the neck and shoulder and down to her fingers with occasional numbness and tingling in her fingertips.  She is able to move her left arm but does complain of pain with external and internal rotation.  Pain is 10/10, mildly relieved with tylenol but reports more relief with ibuprofen.  Of note, she is scheduled for follow up mammogram next week and oncology follow up as well.    Past Medical History  Diagnosis Date  . Allergy   . Anemia   . Depression   . Arthritis   . Cancer   . Cataract   . Diabetes mellitus without complication   . Glaucoma   . Hyperlipidemia   . Hypertension   . Chronic kidney disease   . Neuromuscular disorder   . Thyroid disease    Current Outpatient Prescriptions  Medication Sig Dispense Refill  . acetaminophen (TYLENOL) 325 MG tablet Take 1 tablet (325 mg total) by mouth every 8 (eight) hours as needed for pain.  100 tablet  2  . B-D ULTRAFINE III SHORT PEN 31G X 8 MM MISC       . cloNIDine (CATAPRES) 0.2 MG tablet Take 1 tablet (0.2 mg total) by mouth daily.  30 tablet  2  . cycloSPORINE (RESTASIS) 0.05 % ophthalmic emulsion Place 1 drop into both eyes 2 (two) times daily.      [name] glucose 4 GM chewable tablet Chew 4 tablets (16 g total) by mouth as needed for low blood sugar.  50 tablet  12  . ibuprofen (ADVIL,MOTRIN) 800 MG tablet       . insulin  aspart (NOVOLOG FLEXPEN) 100 unit/mL SOLN FlexPen Inject 15 Units into the skin 3 (three) times daily with meals.  18 mL  3  . Insulin Detemir (LEVEMIR FLEXPEN) 100 UNIT/ML SOPN Inject 60 Units into the skin every morning.  21 mL  3  . ketorolac (ACULAR) 0.4 % SOLN Place 1 drop into the left eye 3 (three) times daily.      [name] levothyroxine (SYNTHROID, LEVOTHROID) 88 MCG tablet Take 1 tablet (88 mcg total) by mouth daily.  30 tablet  3  . metFORMIN (GLUCOPHAGE) 1000 MG tablet Take 1 tablet (1,000 mg total) by mouth 2 (two) times daily with a meal.  60 tablet  5  . olopatadine (PATANOL) 0.1 % ophthalmic solution 1 drop 2 (two) times daily as needed.      . pravastatin (PRAVACHOL) 40 MG tablet Take 1 tablet (40 mg total) by mouth daily.  30 tablet  3  . Travoprost, BAK Free, (TRAVATAN) 0.004 % SOLN ophthalmic solution 1 drop at bedtime.      . triamterene-hydrochlorothiazide (MAXZIDE-25) 37.5-25 MG per tablet Take 0.5 each (0.5 tablets total) by mouth daily.  45 tablet  1  . venlafaxine XR (EFFEXOR-XR) 75 MG 24 hr capsule Take 1 capsule (75  mg total) by mouth daily.  30 capsule  5   No current facility-administered medications for this visit.   Family History  Problem Relation Age of Onset  . Heart disease Father   . Diabetes Father   . Stroke Sister   . Anuerysm Sister   . Heart disease Brother   . Anuerysm Brother    History   Social History  . Marital Status: Divorced    Spouse Name: N/A    Number of Children: N/A  . Years of Education: N/A   Social History Main Topics  . Smoking status: Never Smoker   . Smokeless tobacco: None  . Alcohol Use: No  . Drug Use: No  . Sexually Active: None   Other Topics Concern  . None   Social History Narrative  . None   Review of Systems:  Constitutional:  Feeling cold.  Denies fever, diaphoresis, appetite change and fatigue.   HEENT:  Denies congestion, sore throat, rhinorrhea, sneezing, mouth sores, trouble swallowing, neck pain     Respiratory:  Denies SOB, DOE, cough, and wheezing.   Cardiovascular:  Denies palpitations and leg swelling.   Gastrointestinal:  Denies nausea, vomiting, abdominal pain, diarrhea, constipation, blood in stool and abdominal distention.   Genitourinary:  Denies dysuria, urgency, frequency, hematuria, flank pain and difficulty urinating.   Musculoskeletal:  L arm pain.  Denies back pain, joint swelling, arthralgias and gait problem.   Skin:  Denies pallor, rash and wound.   Neurological:  Denies dizziness, seizures, syncope, weakness, light-headedness, numbness and headaches.    Objective:  Physical Exam: Filed Vitals:   07/28/12 0828  BP: 124/75  Pulse: 85  Temp: 98.7 F (37.1 C)  TempSrc: Oral  Height: 5\' 3"  (1.6 m)  Weight: 163 lb 3.2 oz (74.027 kg)  SpO2: 97%   Vitals reviewed. General: sitting in the chair, acute distress due to pain HEENT: PERRL, EOMI, no scleral icterus Cardiac: RRR, no rubs, murmurs or gallops Pulm: clear to auscultation bilaterally, no wheezes, rales, or rhonchi Abd: soft, nontender, nondistended, BS present Ext: warm and well perfused, non pitting pedal edema b/l lower extremities, +2dp b/l\ Breast exam: R mastectomy, well healed scar. Mild lymphedema R axillary region.  L breast exam, no palpable masses, but tender to palpation lateral surface of left breast and axillary region Neuro: alert and oriented X3, cranial nerves II-XII grossly intact, strength 4/5 LLE vs 5/5 RLE and sensation to light touch equal in bilateral upper and lower extremities  Assessment & Plan:  Discussed with Dr. Aundria Rud -sports medicine referral -mammography--diagnostic with oncology follow up -ibuprofen 600mg  q6h prn -f.u 2-3 weeks, recheck bmet -donna plyler referral

## 2012-07-28 NOTE — Assessment & Plan Note (Signed)
Follow up with Dr. Cyndie Chime 08/07/12.   Left breast diagnostic mammography hopefully prior to appointment with oncology

## 2012-07-28 NOTE — Assessment & Plan Note (Signed)
Unknown etiology.  Denies recent trauma or injury or fracture.  Claims started around January or February 2014 but recently worsening.  Severe pain 10/10, radiating up to neck and down through fingers.  B/l occasional numbness and tingling.  Range of motion in tact but complaints of pain with external rotation and raising arm above head.  Hx of breast cancer, s/p right breast mastectomy.  Scheduled for mammogram and oncology follow up next week.  Breast exam revealed tenderness to palpation of lateral surface of left breast and axillary region.    EKG done today, unchanged from prior, 82bpm, NSR, t wave flattening noted in lead III.  Does not appear to be cardiac pain in nature.  Possibly MSK vs. Possible inflammatory origin given involvement of left breast pain as well along with malignancy history.    -diagnostic mammography of left breast -follow up with oncology -ibuprofen for pain, as she reports pain relief with ibuprofen in the past and not much relief with tylenol.

## 2012-07-30 ENCOUNTER — Ambulatory Visit: Payer: Medicare Other

## 2012-07-30 ENCOUNTER — Other Ambulatory Visit (HOSPITAL_BASED_OUTPATIENT_CLINIC_OR_DEPARTMENT_OTHER): Payer: Medicare Other | Admitting: Lab

## 2012-07-30 DIAGNOSIS — Z853 Personal history of malignant neoplasm of breast: Secondary | ICD-10-CM | POA: Diagnosis not present

## 2012-07-30 LAB — CBC WITH DIFFERENTIAL/PLATELET
BASO%: 0.4 % (ref 0.0–2.0)
Basophils Absolute: 0 10*3/uL (ref 0.0–0.1)
EOS%: 2.3 % (ref 0.0–7.0)
Eosinophils Absolute: 0.2 10*3/uL (ref 0.0–0.5)
HCT: 34.9 % (ref 34.8–46.6)
HGB: 12.1 g/dL (ref 11.6–15.9)
LYMPH%: 21.1 % (ref 14.0–49.7)
MCH: 31.2 pg (ref 25.1–34.0)
MCHC: 34.7 g/dL (ref 31.5–36.0)
MCV: 90 fL (ref 79.5–101.0)
MONO#: 0.4 10*3/uL (ref 0.1–0.9)
MONO%: 4.6 % (ref 0.0–14.0)
NEUT#: 6 10*3/uL (ref 1.5–6.5)
NEUT%: 71.6 % (ref 38.4–76.8)
Platelets: 216 10*3/uL (ref 145–400)
RBC: 3.88 10*6/uL (ref 3.70–5.45)
RDW: 13.1 % (ref 11.2–14.5)
WBC: 8.4 10*3/uL (ref 3.9–10.3)
lymph#: 1.8 10*3/uL (ref 0.9–3.3)

## 2012-07-30 LAB — COMPREHENSIVE METABOLIC PANEL (CC13)
ALT: 10 U/L (ref 0–55)
AST: 15 U/L (ref 5–34)
Albumin: 3.7 g/dL (ref 3.5–5.0)
Alkaline Phosphatase: 61 U/L (ref 40–150)
BUN: 19.8 mg/dL (ref 7.0–26.0)
CO2: 24 mEq/L (ref 22–29)
Calcium: 9.6 mg/dL (ref 8.4–10.4)
Chloride: 105 mEq/L (ref 98–109)
Creatinine: 1.2 mg/dL — ABNORMAL HIGH (ref 0.6–1.1)
Glucose: 210 mg/dl — ABNORMAL HIGH (ref 70–140)
Potassium: 3.7 mEq/L (ref 3.5–5.1)
Sodium: 138 mEq/L (ref 136–145)
Total Bilirubin: 0.39 mg/dL (ref 0.20–1.20)
Total Protein: 7 g/dL (ref 6.4–8.3)

## 2012-08-04 ENCOUNTER — Telehealth: Payer: Self-pay | Admitting: *Deleted

## 2012-08-04 NOTE — Telephone Encounter (Addendum)
Received vm from [facility] @ [facility] requesting dispensing order for post surgical bras, non silicone breast prosthesis & silicone breast prosthesis.  Order signed by Dr Cyndie Chime & faxed to [facility] @ [phone]. & copy of order sent to HIM to be scanned.

## 2012-08-06 ENCOUNTER — Other Ambulatory Visit: Payer: Self-pay

## 2012-08-06 ENCOUNTER — Ambulatory Visit
Admission: RE | Admit: 2012-08-06 | Discharge: 2012-08-06 | Disposition: A | Discharge status: Home / Self Care | Payer: Medicare Other | Source: Ambulatory Visit | Attending: Internal Medicine | Admitting: Internal Medicine

## 2012-08-06 DIAGNOSIS — M79602 Pain in left arm: Secondary | ICD-10-CM

## 2012-08-06 DIAGNOSIS — N644 Mastodynia: Secondary | ICD-10-CM

## 2012-08-07 ENCOUNTER — Encounter: Payer: Self-pay | Admitting: Dietician

## 2012-08-07 ENCOUNTER — Telehealth: Payer: Self-pay | Admitting: Oncology

## 2012-08-07 ENCOUNTER — Ambulatory Visit (HOSPITAL_BASED_OUTPATIENT_CLINIC_OR_DEPARTMENT_OTHER): Payer: Medicare Other | Admitting: Oncology

## 2012-08-07 DIAGNOSIS — H4089 Other specified glaucoma: Secondary | ICD-10-CM | POA: Insufficient documentation

## 2012-08-07 DIAGNOSIS — Z853 Personal history of malignant neoplasm of breast: Secondary | ICD-10-CM | POA: Diagnosis not present

## 2012-08-07 DIAGNOSIS — H409 Unspecified glaucoma: Secondary | ICD-10-CM | POA: Insufficient documentation

## 2012-08-07 NOTE — Telephone Encounter (Signed)
gv and printed appt sched and avs....mammo 7.13.15 @ 10:30

## 2012-08-11 NOTE — Progress Notes (Signed)
Hematology and Oncology Follow Up Visit  [name] [id number] Jan 07, 1951 62 y.o. 08/11/2012 7:02 PM   Principle Diagnosis: Encounter Diagnosis  Name Primary?  [name] BREAST CANCER, HX OF Yes     Interim History:    Follow-up visit for this 62 year old woman initially diagnosed with a Stage III B lymph node positive cancer of the right breast in April of 1999, treated with induction chemotherapy followed by a right mastectomy then radiation and additional chemotherapy. She achieved a durable response with no evidence for recurrence. She is doing well at this time. She has had no interim medical problems. She denies any headache or change in vision, no bone pain, no vaginal bleeding, no change in bowel habit.  Medications: reviewed  Allergies:  Allergies  Allergen Reactions  . Gabapentin     dizziness  . Penicillins   . Sulfonamide Derivatives     Review of Systems: Constitutional:   No constitutional symptoms Respiratory: No cough or dyspnea Cardiovascular:  No chest pain or palpitations  Gastrointestinal: See above Genito-Urinary: See above Musculoskeletal: See above Neurologic: See above Skin: No rash Remaining ROS negative.  Physical Exam: There were no vitals taken for this visit. Wt Readings from Last 3 Encounters:  07/28/12 163 lb 3.2 oz (74.027 kg)  07/03/12 164 lb 14.4 oz (74.798 kg)  06/11/12 160 lb 4.8 oz (72.712 kg)     General appearance: Well-nourished African American woman HENNT: She is wearing a wig. Pharynx no erythema or exudate Lymph nodes: No adenopathy  Breasts: Right mastectomy. No chest wall lesions. No left breast masses Lungs: Clear to auscultation resonant to percussion Heart: Regular rhythm no murmur Abdomen: Soft, nontender, no mass, no organomegaly Extremities: No edema, no Tenderness Musculoskeletal: No joint deformities GU: Vascular: No cyanosis Neurologic: Mental status intact, PERRLA, motor strength 5 over 5, reflexes 1+  symmetric Skin: No rash or ecchymosis  Lab Results: Lab Results  Component Value Date   WBC 8.4 07/30/2012   HGB 12.1 07/30/2012   HCT 34.9 07/30/2012   MCV 90.0 07/30/2012   PLT 216 07/30/2012     Chemistry      Component Value Date/Time   NA 138 07/30/2012 0907   NA 140 12/12/2011 1103   K 3.7 07/30/2012 0907   K 3.8 12/12/2011 1103   CL 106 12/12/2011 1103   CO2 24 07/30/2012 0907   CO2 25 12/12/2011 1103   BUN 19.8 07/30/2012 0907   BUN 21 12/12/2011 1103   CREATININE 1.2* 07/30/2012 0907   CREATININE 1.19* 12/12/2011 1103   CREATININE 1.64* 07/24/2011 0905      Component Value Date/Time   CALCIUM 9.6 07/30/2012 0907   CALCIUM 10.1 12/12/2011 1103   ALKPHOS 61 07/30/2012 0907   ALKPHOS 59 12/12/2011 1103   AST 15 07/30/2012 0907   AST 15 12/12/2011 1103   ALT 10 07/30/2012 0907   ALT 9 12/12/2011 1103   BILITOT 0.39 07/30/2012 0907   BILITOT 0.3 12/12/2011 1103       Radiological Studies: Mm Digital Diagnostic Unilat L  08/06/2012   *RADIOLOGY REPORT*  Clinical Data:  62 year old female with left-sided pain.  DIGITAL DIAGNOSTIC LEFT MAMMOGRAM WITH CAD  Comparison: Multiple priors dating back to 07/12/2005  Findings:  ACR Breast Density Category b:  There are scattered areas of fibroglandular density.  Standard CC and MLO views of the left breast were obtained.  No suspicious microcalcifications, masses, or architectural distortion.  Mammographic images were processed with CAD.  IMPRESSION: BI-RADS CATEGORY 1:  Negative.  RECOMMENDATION: Screening mammogram in one year. (Code:SM-B-01Y)  I have discussed the findings and recommendations with the patient. Results were also provided in writing at the conclusion of the visit.  If applicable, a reminder letter will be sent to the patient regarding her next appointment.   Original Report Authenticated By: Jerene Dilling, M.D.     Impression and Plan:  #1. Stage III invasive cancer right breast treated as outlined above  She remains free of any  obvious recurrence now out a remarkable 15 years.   #2. Poorly controlled diabetes.    #3. Essential hypertension   #4. Hypothyroid on replacement.   Plan:   CC:[name]    Levert Feinstein, MD 7/15/20147:02 PM

## 2012-08-12 ENCOUNTER — Ambulatory Visit (INDEPENDENT_AMBULATORY_CARE_PROVIDER_SITE_OTHER): Payer: Medicare Other | Admitting: Emergency Medicine

## 2012-08-12 VITALS — BP 126/78 | Ht 63.0 in | Wt 162.0 lb

## 2012-08-12 DIAGNOSIS — M75100 Unspecified rotator cuff tear or rupture of unspecified shoulder, not specified as traumatic: Secondary | ICD-10-CM

## 2012-08-12 DIAGNOSIS — M7550 Bursitis of unspecified shoulder: Secondary | ICD-10-CM | POA: Insufficient documentation

## 2012-08-12 DIAGNOSIS — M7552 Bursitis of left shoulder: Secondary | ICD-10-CM

## 2012-08-12 MED ORDER — METHYLPREDNISOLONE ACETATE 40 MG/ML IJ SUSP
40.0000 mg | Freq: Once | INTRAMUSCULAR | Status: AC
  Administered 2012-08-12: 40 mg via INTRA_ARTICULAR

## 2012-08-12 NOTE — Progress Notes (Signed)
Patient ID: [name], female   DOB: 05-25-1950, 62 y.o.   MRN: [id number] This is a 62 yo female who presents to the [facility] clinic today with a complaint of 3 weeks of left shoulder/upper arm pain.  Describes sharp pain worse with movement of the shoulder, particularly reaching overhead and rotating arm.  Denies injury.  No history of prior episodes.  PMH, FH and SH reviewed on chart.  ROS: as per HPI, otherwise negative.  PE: BP 126/78  Ht 5\' 3"  (1.6 m)  Wt 162 lb (73.483 kg)  BMI 28.7 kg/m2 This is a well developed 62 yo female in no acute distress. Left Shoulder: Inspection reveals no abnormalities, atrophy or asymmetry when compared to right. Palpation with no tenderness over AC joint, mild tenderness noted over bicipital groove. ROM is limited passively with abduction and forward flexion secondary to pain. Rotator cuff strength normal throughout. Neer's test is positive for pain, however Hawkins test is negative. Speeds positivel. No labral pathology noted with negative Obrien's, negative clunk and good stability. Normal scapular function observed. Mild painful arc greater than 110 degrees and no drop arm sign.  Right shoulder with normal msk exam.  Neurovascularly intact bilateral upper extremities.  MSK ultrasound of left shoulder Ultrasound of the left shoulder was performed with the patient in seated position.  Images were obtained in short and long axis of the bicepital, subscapularis, supraspinatous and infraspinatous tendons and the glenohumeral joint.  Ultrasound reveals moderate fluid in the subscapularis and bicepital tendon regions as well as evidence of calcific spurring of the humeral head.  Impression: Rotator cuff tendonopathy vs. subacromial bursitis secondary with osteoarthric changes to the glenohumeral joint.  Plan: The findings of clinical exam and ultrasound were discussed with the patient and options for oral anti-inflammatory medication or subacromial  injection were given.  Patient elected to have subacromial injection.  She was counseled to watch blood sugar closely as she is diabetic and steroid may alter blood glucose trends.  Patient has appointment with PMD in am and will monitor sugars.  In addition to injection patient is given plan for Jobe exercises.  Follow up in 2 months for further evaluation, a recheck of symptoms and to dicuss further imaging if symptoms have not improved.  Procedure: Consent obtained and verified. Sterile prep Topical analgesic spray: Ethyl chloride. Joint:  Left subacrial bursa Approached in typical fashion with: depomedrol/xylocaine Completed without difficulty Meds: 3cc xylocaine, 1cc depomedrol Needle:  25 Ga Aftercare instructions and Red flags advised. Patient tolerated procedure well, no complications.

## 2012-08-13 ENCOUNTER — Ambulatory Visit (INDEPENDENT_AMBULATORY_CARE_PROVIDER_SITE_OTHER): Payer: Medicare Other | Admitting: Dietician

## 2012-08-13 ENCOUNTER — Ambulatory Visit (INDEPENDENT_AMBULATORY_CARE_PROVIDER_SITE_OTHER): Payer: Medicare Other | Admitting: Internal Medicine

## 2012-08-13 ENCOUNTER — Encounter: Payer: Self-pay | Admitting: Internal Medicine

## 2012-08-13 VITALS — BP 104/67 | HR 78 | Temp 97.6°F | Ht 64.0 in | Wt 162.7 lb

## 2012-08-13 DIAGNOSIS — E1165 Type 2 diabetes mellitus with hyperglycemia: Secondary | ICD-10-CM

## 2012-08-13 DIAGNOSIS — Z23 Encounter for immunization: Secondary | ICD-10-CM

## 2012-08-13 DIAGNOSIS — E1129 Type 2 diabetes mellitus with other diabetic kidney complication: Secondary | ICD-10-CM

## 2012-08-13 DIAGNOSIS — N058 Unspecified nephritic syndrome with other morphologic changes: Secondary | ICD-10-CM

## 2012-08-13 DIAGNOSIS — I10 Essential (primary) hypertension: Secondary | ICD-10-CM | POA: Insufficient documentation

## 2012-08-13 DIAGNOSIS — E785 Hyperlipidemia, unspecified: Secondary | ICD-10-CM | POA: Diagnosis not present

## 2012-08-13 DIAGNOSIS — E119 Type 2 diabetes mellitus without complications: Secondary | ICD-10-CM | POA: Diagnosis not present

## 2012-08-13 DIAGNOSIS — F329 Major depressive disorder, single episode, unspecified: Secondary | ICD-10-CM

## 2012-08-13 DIAGNOSIS — E039 Hypothyroidism, unspecified: Secondary | ICD-10-CM

## 2012-08-13 DIAGNOSIS — N183 Chronic kidney disease, stage 3 unspecified: Secondary | ICD-10-CM | POA: Insufficient documentation

## 2012-08-13 LAB — GLUCOSE, CAPILLARY: Glucose-Capillary: 252 mg/dL — ABNORMAL HIGH (ref 70–99)

## 2012-08-13 NOTE — Assessment & Plan Note (Signed)
She is doing well on her Effexor.

## 2012-08-13 NOTE — Progress Notes (Signed)
Medical Nutrition Therapy:  Appt start time: 1030 end time:  1130.  Assessment:  Primary concerns today: Blood sugar control.   Patient oscillates a bit on frequency of omitted Metformin and Novolog doses, reports she sometimes skips levemir. Has Engineering geologist with her. Blood sugars range from high to low throughout the day. Unable to identify pattern(s). Patient is poor historian when it comes to explaining how often she takes (or does not take) medications and in describing what she eats. We spent this visit trying to better identify medication use and diet and then determined that patient should use a food/medication/blood glucose record for 3 days and bring it to the next visit and patient agreed.  Usual eating pattern includes 2 meals and 1-2 snacks per day.    24-hr recall:  Patient says that she takes Novolog before she eats and if she has checked her blood sugar.    B (9-10 AM)- Wake, sometimes drinks orange juice (12 oz), sometimes has cereal with milk Snk (11PM)- if patient does not eat at breakfast, she will then have cereal with milk  D ( PM) - did not obtain this visit Snk (PM)- did not obtain this visit  Progress Towards Goal(s):  No progress.   Nutritional Diagnosis:  [place]-2.2 Altered nutrition-related laboratory As related to elevated glucose. As evidenced by high hemoglobin A1C.   Intervention:  Patient agreed to keeping a dietary food record including record of medications and blood sugars for 3 days.  Monitoring/Evaluation: Dietary intake, blood sugars, insulin timing and amounts and weight in  2 weeks.

## 2012-08-13 NOTE — Assessment & Plan Note (Signed)
On Clonidine and triamterene and HCTZ. BP low but asymptomatic today. Not on ACEI. Only 1 prior microalb and OK. Creatinine elevated and stage 3 CRI. I discussed that her kidneys were not nl but unchanged in 5 yrs and she needs better CBG control to prevent worsening.   1. Repeat microalb today 2. Taper off clonidine 3. Recheck 2-4 weeks 4. Start lisinopril at F/U if BP can tolerate.

## 2012-08-13 NOTE — Patient Instructions (Addendum)
Please keep insulin you are suing at room temp- NOT In Refrigerator ( can call company Novolog pens or bring with you to next visit)   Write down for 3 days: blood sugar- what you eat and how much and how much NOVOLOG you take and when you take it.   [name] [phone]

## 2012-08-13 NOTE — Assessment & Plan Note (Signed)
TSH is well controlled on Synthroid.

## 2012-08-13 NOTE — Assessment & Plan Note (Signed)
Very poor control. LAst A1C was > 14. Too early to recheck. Checks CBG TID. No lows. High all day. On metformin 1000 BID, levemir 60, and Novolog 15 TID. To see Lupita Leash today and will work with Lupita Leash to increase dosage for better control. Pneumovax today. Microalb today.

## 2012-08-13 NOTE — Assessment & Plan Note (Signed)
LDL is well controlled on her statin

## 2012-08-13 NOTE — Patient Instructions (Signed)
1. Please see Dr Kem Kays in 2-4 weeks to follow up your diabetes and blood pressure 2. Please slowly taper off your clonidine as follows:  Take clonidine 0.2 mg ONE HALF pill twice a day for 7 days  Then stop the clonidine 3. We will recheck your blood pressure in 2-4 weeks and start a new medicine called lisinopril iif your blood pressure is OK

## 2012-08-13 NOTE — Progress Notes (Signed)
  Subjective:    Patient ID: [name], female    DOB: 1950/04/07, 62 y.o.   MRN: [id number]  HPI  Please see the A&P for the status of the pt's chronic medical problems.  Review of Systems  Constitutional: Negative for activity change and appetite change.  Genitourinary: Negative for dysuria, urgency and frequency.  Skin: Positive for rash.  Neurological: Negative for dizziness and light-headedness.       Objective:   Physical Exam  Constitutional: She is oriented to person, place, and time. She appears well-developed and well-nourished. No distress.  HENT:  Head: Normocephalic and atraumatic.  Right Ear: External ear normal.  Left Ear: External ear normal.  Nose: Nose normal.  Eyes: Conjunctivae and EOM are normal.  Neck:  No carotid bruits  Cardiovascular: Normal rate, regular rhythm and normal heart sounds.   Musculoskeletal: Normal range of motion. She exhibits no edema and no tenderness.  Neurological: She is alert and oriented to person, place, and time.  Skin: Skin is warm and dry. Rash noted. She is not diaphoretic.  L elbow region  Psychiatric: She has a normal mood and affect. Her behavior is normal. Judgment and thought content normal.          Assessment & Plan:

## 2012-08-14 ENCOUNTER — Other Ambulatory Visit: Payer: Self-pay | Admitting: Internal Medicine

## 2012-08-14 DIAGNOSIS — Z9119 Patient's noncompliance with other medical treatment and regimen: Secondary | ICD-10-CM

## 2012-08-14 LAB — MICROALBUMIN / CREATININE URINE RATIO
Creatinine, Urine: 77.3 mg/dL
Microalb Creat Ratio: 6.5 mg/g (ref 0.0–30.0)
Microalb, Ur: 0.5 mg/dL (ref 0.00–1.89)

## 2012-08-14 NOTE — Progress Notes (Signed)
Patient would like to speak with social work to learn about community resources. ( says her family was having difficulties affording food from January - July 2014

## 2012-08-14 NOTE — Progress Notes (Signed)
Can she see our SW in our [facility]? Thanks.

## 2012-08-18 ENCOUNTER — Telehealth: Payer: Self-pay | Admitting: Licensed Clinical Social Worker

## 2012-08-18 NOTE — Telephone Encounter (Signed)
[name] was referred to CSW for community resources particularly food.  Pt has Medicare and Medicaid.  CSW placed called to pt.  CSW left message requesting return call. CSW provided contact hours and phone number.

## 2012-08-21 NOTE — Telephone Encounter (Signed)
CSW placed called to pt.  CSW left message requesting return call. CSW provided contact hours and phone number.  Pt has an appt on 7/30, CSW will be available to answer any questions or resources pt may need.  CSW will sign off as have yet to receive return phone call after 3 message attempts.

## 2012-08-26 ENCOUNTER — Ambulatory Visit (INDEPENDENT_AMBULATORY_CARE_PROVIDER_SITE_OTHER): Payer: Medicare Other | Admitting: Dietician

## 2012-08-26 ENCOUNTER — Ambulatory Visit (INDEPENDENT_AMBULATORY_CARE_PROVIDER_SITE_OTHER): Payer: Medicare Other | Admitting: Internal Medicine

## 2012-08-26 ENCOUNTER — Encounter: Payer: Self-pay | Admitting: Dietician

## 2012-08-26 ENCOUNTER — Encounter: Payer: Self-pay | Admitting: Internal Medicine

## 2012-08-26 VITALS — BP 101/64 | HR 77 | Temp 96.7°F | Ht 63.0 in | Wt 163.4 lb

## 2012-08-26 DIAGNOSIS — Z0000 Encounter for general adult medical examination without abnormal findings: Secondary | ICD-10-CM

## 2012-08-26 DIAGNOSIS — E1165 Type 2 diabetes mellitus with hyperglycemia: Secondary | ICD-10-CM

## 2012-08-26 DIAGNOSIS — N058 Unspecified nephritic syndrome with other morphologic changes: Secondary | ICD-10-CM

## 2012-08-26 DIAGNOSIS — R1310 Dysphagia, unspecified: Secondary | ICD-10-CM | POA: Diagnosis not present

## 2012-08-26 DIAGNOSIS — I10 Essential (primary) hypertension: Secondary | ICD-10-CM | POA: Diagnosis not present

## 2012-08-26 DIAGNOSIS — IMO0002 Reserved for concepts with insufficient information to code with codable children: Secondary | ICD-10-CM

## 2012-08-26 DIAGNOSIS — E1129 Type 2 diabetes mellitus with other diabetic kidney complication: Secondary | ICD-10-CM

## 2012-08-26 DIAGNOSIS — E119 Type 2 diabetes mellitus without complications: Secondary | ICD-10-CM

## 2012-08-26 LAB — GLUCOSE, CAPILLARY: Glucose-Capillary: 136 mg/dL — ABNORMAL HIGH (ref 70–99)

## 2012-08-26 LAB — POCT GLYCOSYLATED HEMOGLOBIN (HGB A1C): Hemoglobin A1C: 9

## 2012-08-26 NOTE — Progress Notes (Signed)
Medical Nutrition Therapy:  Appt start time: 1030 end time:  1100.  Assessment:  Primary concerns today: Blood sugar control.  Patient has uncontrolled diabetes with renal manefestations. Goal is improved blood sugars to decrease her risk of further renal impairment. She  forgot food/Novolog record, brought meter which was downloaded. Shows some in target in am and other very high with 4 done nocturnally and > 95, 2 < 70 and positive for signs and symptoms of hypoglycemia. More blood sugars in target.  Indicates she skips Novolog when blood sugar is < 100. Note A1C improved  Usual eating pattern includes 2 meals and 1-2 snacks per day.   Progress Towards Goal(s):  Minimal progress.   Nutritional Diagnosis:  [place]-2.2 Altered nutrition-related laboratory As related to elevated glucose. As evidenced by high hemoglobin A1C.   Intervention:  Nutrition education: Taught patient to mark blood sugars and add comments in meter.   Patient to return with  dietary food record.  Monitoring/Evaluation: Dietary intake, blood sugars, insulin timing and amounts and weight in  2-4 weeks.  Addendum- patient provided with a Onetouch verio meter- she is to confirm with her mail order company that they can provide her the strips for this meter.

## 2012-08-26 NOTE — Assessment & Plan Note (Signed)
BP Readings from Last 3 Encounters:  08/26/12 101/64  08/13/12 104/67  08/12/12 126/78    Lab Results  Component Value Date   NA 138 07/30/2012   K 3.7 07/30/2012   CREATININE 1.2* 07/30/2012    Assessment: Blood pressure control: controlled Progress toward BP goal:  at goal Comments: none  Plan: Medications:  continue current medications but will titrate off Clonidine  Educational resources provided: brochure;handout Self management tools provided: home blood pressure logbook;other (see comments) Other plans: if BP elevated in the future consider adding Lisinopril

## 2012-08-26 NOTE — Assessment & Plan Note (Signed)
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 Barrett [facility] in 3 years

## 2012-08-26 NOTE — Progress Notes (Signed)
Case discussed with Dr. McLean at the time of the visit.  We reviewed the resident's history and exam and pertinent patient test results.  I agree with the assessment, diagnosis, and plan of care documented in the resident's note.     

## 2012-08-26 NOTE — Progress Notes (Signed)
  Subjective:    Patient ID: [name], female    DOB: 1950/07/02, 62 y.o.   MRN: [id number]  HPI Comments: 62 y.o PMH uncontrolled DM 2 (>62.0) HA1C 05/28/12), HTN (BP 101/64), HLD (LDL 74 11/2011), CKD 3 (last Cr 7/17 was 1.2), h/o left breast cancer, depression, subacromial bursitis, hypothyroidism.   She presents for uncontrolled DM and HTN f/u.  Meter readings do have some lows 61 and 71 but values fluctuate throughout the day.  Fasting breakfast readings range 86 to 318 and after breakfast 118-359.  Lunch readings range from 80-235 and dinner readings before dinner range 177-447 and after dinner 83 to 360 and a couple of night time lows 60 and 61.  She states yesterday she ate a fast food [employer] and checked her fsbs and it was 360. She took her levemir and meal time insulin 15 units and woke up about 1 am feeling nervous jittery and she checked her fsbs and it was 60.  She states she feels tired.    She has chronic left shoulder pain due to subacromial bursitis which she recently had a steroid inj. And her pain is now 3-4/10. She takes Ibuprofen bid for pain.    When asked ROS she states sometimes she feels lightheaded with bending over.    ROS per HPI     Review of Systems  Respiratory: Negative for shortness of breath.   Cardiovascular: Negative for chest pain and leg swelling.       Objective:   Physical Exam  Nursing note and vitals reviewed. Constitutional: She is oriented to person, place, and time. Vital signs are normal. She appears well-developed and well-nourished. She is cooperative. No distress.  HENT:  Head: Normocephalic and atraumatic.  Mouth/Throat: Oropharynx is clear and moist and mucous membranes are normal. She has dentures. No oropharyngeal exudate.  Eyes: Conjunctivae are normal. Pupils are equal, round, and reactive to light. Right eye exhibits no discharge. Left eye exhibits no discharge. No scleral icterus.  Cardiovascular: Normal rate,  regular rhythm, S1 normal, S2 normal and normal heart sounds.   No murmur heard. No leg edema  Pulmonary/Chest: Effort normal and breath sounds normal. No respiratory distress. She has no wheezes.  Abdominal:  Obese ab  Neurological: She is alert and oriented to person, place, and time. Gait normal.  Skin: Skin is warm, dry and intact. No rash noted. She is not diaphoretic.  Psychiatric: She has a normal mood and affect. Her speech is normal and behavior is normal. Judgment and thought content normal. Cognition and memory are normal.          Assessment & Plan:  F/u in 3 months

## 2012-08-26 NOTE — Assessment & Plan Note (Addendum)
Lab Results  Component Value Date   HGBA1C 9.0 08/26/2012   HGBA1C >14.0 05/28/2012   HGBA1C 12.1 03/26/2012     Assessment: Diabetes control: poor control (HgbA1C >9%) Progress toward A1C goal:  improved HA1C 9.0 Comments: patient is missing some doses of prandial intermittently. She takes Levemir around 10 PM and is taking Prandial with meals.  She is having hypoglycemic episodes at night 60s-70s with symptoms   Plan: Medications:  continue current medications but will decrease Levemir to 55 units qhs and cont Novolog 15 units tid with meals  Home glucose monitoring: Frequency: 3 times a day Timing: before meals;at bedtime Instruction/counseling given: reminded to bring blood glucose meter & log to each visit Educational resources provided: brochure;handout Self management tools provided: copy of home glucose meter download;other (see comments) Other plans: patient met with Gavin Pound today and will f/u with her as scheduled. F/u for DM in 3 months

## 2012-08-26 NOTE — Patient Instructions (Addendum)
General Instructions: Taper off Clonidine completely. Will will monitor you blood pressure at follow up Bring all medications and meter and food diary to each appointment Read over the information below  Return to clinic in 3 months  Please take Levemir 55 units at night and still take Novolog 15 units tid  Follow up with Lupita Leash as scheduled.  Diabetes, Eating Away From Home Sometimes, you might eat in a restaurant or have meals that are prepared by someone else. You can enjoy eating out. However, the portions in restaurants may be much larger than needed. Listed below are some ideas to help you choose foods that will keep your blood glucose (sugar) in better control.  TIPS FOR EATING OUT  Know your meal plan and how many carbohydrate servings you should have at each meal. You may wish to carry a copy of your meal plan in your purse or wallet. Learn the foods included in each food group.  Make a list of restaurants near you that offer healthy choices. Take a copy of the carry-out menus to see what they offer. Then, you can plan what you will order ahead of time.  Become familiar with serving sizes by practicing them at home using measuring cups and spoons. Once you learn to recognize portion sizes, you will be able to correctly estimate the amount of total carbohydrate you are allowed to eat at the restaurant. Ask for a takeout box if the portion is more than you should have. When your food comes, leave the amount you should have on the plate, and put the rest in the takeout box before you start eating.  Plan ahead if your mealtime will be different from usual. Check with your caregiver to find out how to time meals and medicine if you are taking insulin.  Avoid high-fat foods, such as fried foods, cream sauces, high-fat salad dressings, or any added butter or margarine.  Do not be afraid to ask questions. Ask your server about the portion size, cooking methods, ingredients and if items can be  substituted. Restaurants do not list all available items on the menu. You can ask for your main entree to be prepared using skim milk, oil instead of butter or margarine, and without gravy or sauces. Ask your waiter or waitress to serve salad dressings, gravy, sauces, margarine, and sour cream on the side. You can then add the amount your meal plan suggests.  Add more vegetables whenever possible.  Avoid items that are labeled "jumbo," "giant," "deluxe," or "supersized."  You may want to split an entre with someone and order an extra side salad.  Watch for hidden calories in foods like croutons, bacon, or cheese.  Ask your server to take away the bread basket or chips from your table.  Order a dinner salad as an appetizer. You can eat most foods served in a restaurant. Some foods are better choices than others. Breads and Starches  Recommended: All kinds of bread (wheat, rye, white, oatmeal, [place], [place], raisin), hard or soft dinner rolls, frankfurter or hamburger buns, small bagels, small corn or whole-wheat flour tortillas.  Avoid: Frosted or glazed breads, butter rolls, egg or cheese breads, croissants, sweet rolls, pastries, coffee cake, glazed or frosted doughnuts, muffins. Crackers  Recommended: Animal crackers, graham, rye, saltine, oyster, and matzoth crackers. Bread sticks, melba toast, rusks, pretzels, popcorn (without fat), zwieback toast.  Avoid: High-fat snack crackers or chips. Buttered popcorn. Cereals  Recommended: Hot and cold cereals. Whole grains such as oatmeal or  shredded wheat are good choices.  Avoid: Sugar-coated or granola type cereals. Potatoes/Pasta/Rice/Beans  Recommended: Order baked, boiled, or mashed potatoes, rice or noodles without added fat, whole beans. Order gravies, butter, margarine, or sauces on the side so you can control the amount you add.  Avoid: Hash browns or fried potatoes. Potatoes, pasta, or rice prepared with cream or cheese  sauce. Potato or pasta salads prepared with large amounts of dressing. Fried beans or fried rice. Vegetables  Recommended: Order steamed, baked, boiled, or stewed vegetables without sauces or extra fat. Ask that sauce be served on the side. If vegetables are not listed on the menu, ask what is available.  Avoid: Vegetables prepared with cream, butter, or cheese sauce. Fried vegetables. Salad Bars  Recommended: Many of the vegetables at a salad bar are considered "free." Use lemon juice, vinegar, or low-calorie salad dressing (fewer than 20 calories per serving) as "free" dressings for your salad. Look for salad bar ingredients that have no added fat or sugar such as tomatoes, lettuce, cucumbers, broccoli, carrots, onions, and mushrooms.  Avoid: Prepared salads with large amounts of dressing, such as coleslaw, caesar salad, macaroni salad, bean salad, or carrot salad. Fruit  Recommended: Eat fresh fruit or fresh fruit salad without added dressing. A salad bar often offers fresh fruit choices, but canned fruit at a restaurant is usually packed in sugar or syrup.  Avoid: Sweetened canned or frozen fruits, plain or sweetened fruit juice. Fruit salads with dressing, sour cream, or sugar added to them. Meat and Meat Substitutes  Recommended: Order broiled, baked, roasted, or grilled meat, poultry, or fish. Trim off all visible fat. Do not eat the skin of poultry. The size stated on the menu is the raw weight. Meat shrinks by  in cooking (for example, 4 oz raw equals 3 oz cooked meat).  Avoid: Deep-fat fried meat, poultry, or fish. Breaded meats. Eggs  Recommended: Order soft, hard-cooked, poached, or scrambled eggs. Omelets may be okay, depending on what ingredients are added. Egg substitutes are also a good choice.  Avoid: Fried eggs, eggs prepared with cream or cheese sauce. Milk  Recommended: Order low-fat or fat-free milk according to your meal plan. Plain, nonfat yogurt or flavored yogurt  with no sugar added may be used as a substitute for milk. Soy milk may also be used.  Avoid: Milk shakes or sweetened milk beverages. Soups and Combination Foods  Recommended: Clear broth or consomm are "free" foods and may be used as an appetizer. Broth-based soups with fat removed count as a starch serving and are preferred over cream soups. Soups made with beans or split peas may be eaten but count as a starch.  Avoid: Fatty soups, soup made with cream, cheese soup. Combination foods prepared with excessive amounts of fat or with cream or cheese sauces. Desserts and Sweets  Recommended: Ask for fresh fruit. Sponge or angel food cake without icing, ice milk, no sugar added ice cream, sherbet, or frozen yogurt may fit into your meal plan occasionally.  Avoid: Pastries, puddings, pies, cakes with icing, custard, gelatin desserts. Fats and Oils  Recommended: Choose healthy fats such as olive oil, canola oil, or tub margarine, reduced fat or fat-free sour cream, cream cheese, avocado, or nuts.  Avoid: Any fats in excess of your allowed portion. Deep-fried foods or any food with a large amount of fat. Note: Ask for all fats to be served on the side, and limit your portion sizes according to your meal plan. Document  Released: 01/14/2005 Document Revised: 04/08/2011 Document Reviewed: 08/04/2008 [facility] Patient Information 2014 [place], [place].  Type 2 Diabetes Mellitus, Adult Type 2 diabetes mellitus, often simply referred to as type 2 diabetes, is a long-lasting (chronic) disease. In type 2 diabetes, the pancreas does not make enough insulin (a hormone), the cells are less responsive to the insulin that is made (insulin resistance), or both. Normally, insulin moves sugars from food into the tissue cells. The tissue cells use the sugars for energy. The lack of insulin or the lack of normal response to insulin causes excess sugars to build up in the blood instead of going into the tissue cells.  As a result, high blood sugar (hyperglycemia) develops. The effect of high sugar (glucose) levels can cause many complications. Type 2 diabetes was also previously called adult-onset diabetes but it can occur at any age.  RISK FACTORS  A person is predisposed to developing type 2 diabetes if someone in the family has the disease and also has one or more of the following primary risk factors:  Overweight.  An inactive lifestyle.  A history of consistently eating high-calorie foods. Maintaining a normal weight and regular physical activity can reduce the chance of developing type 2 diabetes. SYMPTOMS  A person with type 2 diabetes may not show symptoms initially. The symptoms of type 2 diabetes appear slowly. The symptoms include:  Increased thirst (polydipsia).  Increased urination (polyuria).  Increased urination during the night (nocturia).  Weight loss. This weight loss may be rapid.  Frequent, recurring infections.  Tiredness (fatigue).  Weakness.  Vision changes, such as blurred vision.  Fruity smell to your breath.  Abdominal pain.  Nausea or vomiting.  Cuts or bruises which are slow to heal.  Tingling or numbness in the hands or feet. DIAGNOSIS Type 2 diabetes is frequently not diagnosed until complications of diabetes are present. Type 2 diabetes is diagnosed when symptoms or complications are present and when blood glucose levels are increased. Your blood glucose level may be checked by one or more of the following blood tests:  A fasting blood glucose test. You will not be allowed to eat for at least 8 hours before a blood sample is taken.  A random blood glucose test. Your blood glucose is checked at any time of the day regardless of when you ate.  A hemoglobin A1c blood glucose test. A hemoglobin A1c test provides information about blood glucose control over the previous 3 months.  An oral glucose tolerance test (OGTT). Your blood glucose is measured  after you have not eaten (fasted) for 2 hours and then after you drink a glucose-containing beverage. TREATMENT   You may need to take insulin or diabetes medicine daily to keep blood glucose levels in the desired range.  You will need to match insulin dosing with exercise and healthy food choices. The treatment goal is to maintain the before meal blood sugar (preprandial glucose) level at 70 130 mg/dL. HOME CARE INSTRUCTIONS   Have your hemoglobin A1c level checked twice a year.  Perform daily blood glucose monitoring as directed by your caregiver.  Monitor urine ketones when you are ill and as directed by your caregiver.  Take your diabetes medicine or insulin as directed by your caregiver to maintain your blood glucose levels in the desired range.  Never run out of diabetes medicine or insulin. It is needed every day.  Adjust insulin based on your intake of carbohydrates. Carbohydrates can raise blood glucose levels but need to be  included in your diet. Carbohydrates provide vitamins, minerals, and fiber which are an essential part of a healthy diet. Carbohydrates are found in fruits, vegetables, whole grains, dairy products, legumes, and foods containing added sugars.    Eat healthy foods. Alternate 3 meals with 3 snacks.  Lose weight if overweight.  Carry a medical alert card or wear your medical alert jewelry.  Carry a 15 gram carbohydrate snack with you at all times to treat low blood glucose (hypoglycemia). Some examples of 15 gram carbohydrate snacks include:  Glucose tablets, 3 or 4   Glucose gel, 15 gram tube  Raisins, 2 tablespoons (24 grams)  Jelly beans, 6  Animal crackers, 8  Regular pop, 4 ounces (120 mL)  Gummy treats, 9  Recognize hypoglycemia. Hypoglycemia occurs with blood glucose levels of 70 mg/dL and below. The risk for hypoglycemia increases when fasting or skipping meals, during or after intense exercise, and during sleep. Hypoglycemia symptoms  can include:  Tremors or shakes.  Decreased ability to concentrate.  Sweating.  Increased heart rate.  Headache.  Dry mouth.  Hunger.  Irritability.  Anxiety.  Restless sleep.  Altered speech or coordination.  Confusion.  Treat hypoglycemia promptly. If you are alert and able to safely swallow, follow the 15:15 rule:  Take 15 20 grams of rapid-acting glucose or carbohydrate. Rapid-acting options include glucose gel, glucose tablets, or 4 ounces (120 mL) of fruit juice, regular soda, or low fat milk.  Check your blood glucose level 15 minutes after taking the glucose.  Take 15 20 grams more of glucose if the repeat blood glucose level is still 70 mg/dL or below.  Eat a meal or snack within 1 hour once blood glucose levels return to normal.    Be alert to polyuria and polydipsia which are early signs of hyperglycemia. An early awareness of hyperglycemia allows for prompt treatment. Treat hyperglycemia as directed by your caregiver.  Engage in at least 150 minutes of moderate-intensity physical activity a week, spread over at least 3 days of the week or as directed by your caregiver. In addition, you should engage in resistance exercise at least 2 times a week or as directed by your caregiver.  Adjust your medicine and food intake as needed if you start a new exercise or sport.  Follow your sick day plan at any time you are unable to eat or drink as usual.  Avoid tobacco use.  Limit alcohol intake to no more than 1 drink per day for nonpregnant women and 2 drinks per day for men. You should drink alcohol only when you are also eating food. Talk with your caregiver whether alcohol is safe for you. Tell your caregiver if you drink alcohol several times a week.  Follow up with your caregiver regularly.  Schedule an eye exam soon after the diagnosis of type 2 diabetes and then annually.  Perform daily skin and foot care. Examine your skin and feet daily for cuts,  bruises, redness, nail problems, bleeding, blisters, or sores. A foot exam by a caregiver should be done annually.  Brush your teeth and gums at least twice a day and floss at least once a day. Follow up with your dentist regularly.  Share your diabetes management plan with your workplace or school.  Stay up-to-date with immunizations.  Learn to manage stress.  Obtain ongoing diabetes education and support as needed.  Participate in, or seek rehabilitation as needed to maintain or improve independence and quality of life. Request a physical  or occupational therapy referral if you are having foot or hand numbness or difficulties with grooming, dressing, eating, or physical activity. SEEK MEDICAL CARE IF:   You are unable to eat food or drink fluids for more than 6 hours.  You have nausea and vomiting for more than 6 hours.  Your blood glucose level is over 240 mg/dL.  There is a change in mental status.  You develop an additional serious illness.  You have diarrhea for more than 6 hours.  You have been sick or have had a fever for a couple of days and are not getting better.  You have pain during any physical activity.  SEEK IMMEDIATE MEDICAL CARE IF:  You have difficulty breathing.  You have moderate to large ketone levels. MAKE SURE YOU:  Understand these instructions.  Will watch your condition.  Will get help right away if you are not doing well or get worse. Document Released: 01/14/2005 Document Revised: 10/09/2011 Document Reviewed: 08/13/2011 [facility] Patient Information 2014 [place], [place].  DASH Diet The DASH diet stands for "Dietary Approaches to Stop Hypertension." It is a healthy eating plan that has been shown to reduce high blood pressure (hypertension) in as little as 14 days, while also possibly providing other significant health benefits. These other health benefits include reducing the risk of breast cancer after menopause and reducing the risk of  type 2 diabetes, heart disease, colon cancer, and stroke. Health benefits also include weight loss and slowing kidney failure in patients with chronic kidney disease.  DIET GUIDELINES  Limit salt (sodium). Your diet should contain less than 1500 mg of sodium daily.  Limit refined or processed carbohydrates. Your diet should include mostly whole grains. Desserts and added sugars should be used sparingly.  Include small amounts of heart-healthy fats. These types of fats include nuts, oils, and tub margarine. Limit saturated and trans fats. These fats have been shown to be harmful in the body. CHOOSING FOODS  The following food groups are based on a 2000 calorie diet. See your Registered Dietitian for individual calorie needs. Grains and Grain Products (6 to 8 servings daily)  Eat More Often: Whole-wheat bread, brown rice, whole-grain or wheat pasta, quinoa, popcorn without added fat or salt (air popped).  Eat Less Often: White bread, white pasta, white rice, cornbread. Vegetables (4 to 5 servings daily)  Eat More Often: Fresh, frozen, and canned vegetables. Vegetables may be raw, steamed, roasted, or grilled with a minimal amount of fat.  Eat Less Often/Avoid: Creamed or fried vegetables. Vegetables in a cheese sauce. Fruit (4 to 5 servings daily)  Eat More Often: All fresh, canned (in natural juice), or frozen fruits. Dried fruits without added sugar. One hundred percent fruit juice ( cup [237 mL] daily).  Eat Less Often: Dried fruits with added sugar. Canned fruit in light or heavy syrup. [employer], Fish, and Poultry (2 servings or less daily. One serving is 3 to 4 oz [85-114 g]).  Eat More Often: Ninety percent or leaner ground beef, tenderloin, sirloin. Round cuts of beef, chicken breast, [place] breast. All fish. Grill, bake, or broil your meat. Nothing should be fried.  Eat Less Often/Avoid: Fatty cuts of meat, [place], or chicken leg, thigh, or wing. Fried cuts of meat or  fish. Dairy (2 to 3 servings)  Eat More Often: Low-fat or fat-free milk, low-fat plain or light yogurt, reduced-fat or part-skim cheese.  Eat Less Often/Avoid: Milk (whole, 2%).Whole milk yogurt. Full-fat cheeses. Nuts, Seeds, and Legumes (4 to  5 servings per week)  Eat More Often: All without added salt.  Eat Less Often/Avoid: Salted nuts and seeds, canned beans with added salt. Fats and Sweets (limited)  Eat More Often: Vegetable oils, tub margarines without trans fats, sugar-free gelatin. Mayonnaise and salad dressings.  Eat Less Often/Avoid: Coconut oils, palm oils, butter, stick margarine, cream, half and half, cookies, candy, pie. FOR MORE INFORMATION The Dash Diet Eating Plan: www.dashdiet.org Document Released: 01/03/2011 Document Revised: 04/08/2011 Document Reviewed: 01/03/2011 [facility] Patient Information 2014 [place], [place].  Hypertension As your heart beats, it forces blood through your arteries. This force is your blood pressure. If the pressure is too high, it is called hypertension (HTN) or high blood pressure. HTN is dangerous because you may have it and not know it. High blood pressure may mean that your heart has to work harder to pump blood. Your arteries may be narrow or stiff. The extra work puts you at risk for heart disease, stroke, and other problems.  Blood pressure consists of two numbers, a higher number over a lower, 110/72, for example. It is stated as "110 over 72." The ideal is below 120 for the top number (systolic) and under 80 for the bottom (diastolic). Write down your blood pressure today. You should pay close attention to your blood pressure if you have certain conditions such as:  Heart failure.  Prior heart attack.  Diabetes  Chronic kidney disease.  Prior stroke.  Multiple risk factors for heart disease. To see if you have HTN, your blood pressure should be measured while you are seated with your arm held at the level of the heart. It  should be measured at least twice. A one-time elevated blood pressure reading (especially in the Emergency Department) does not mean that you need treatment. There may be conditions in which the blood pressure is different between your right and left arms. It is important to see your caregiver soon for a recheck. Most people have essential hypertension which means that there is not a specific cause. This type of high blood pressure may be lowered by changing lifestyle factors such as:  Stress.  Smoking.  Lack of exercise.  Excessive weight.  Drug/tobacco/alcohol use.  Eating less salt. Most people do not have symptoms from high blood pressure until it has caused damage to the body. Effective treatment can often prevent, delay or reduce that damage. TREATMENT  When a cause has been identified, treatment for high blood pressure is directed at the cause. There are a large number of medications to treat HTN. These fall into several categories, and your caregiver will help you select the medicines that are best for you. Medications may have side effects. You should review side effects with your caregiver. If your blood pressure stays high after you have made lifestyle changes or started on medicines,   Your medication(s) may need to be changed.  Other problems may need to be addressed.  Be certain you understand your prescriptions, and know how and when to take your medicine.  Be sure to follow up with your caregiver within the time frame advised (usually within two weeks) to have your blood pressure rechecked and to review your medications.  If you are taking more than one medicine to lower your blood pressure, make sure you know how and at what times they should be taken. Taking two medicines at the same time can result in blood pressure that is too low. SEEK IMMEDIATE MEDICAL CARE IF:  You develop a severe  headache, blurred or changing vision, or confusion.  You have unusual weakness or  numbness, or a faint feeling.  You have severe chest or abdominal pain, vomiting, or breathing problems. MAKE SURE YOU:   Understand these instructions.  Will watch your condition.  Will get help right away if you are not doing well or get worse. Document Released: 01/14/2005 Document Revised: 04/08/2011 Document Reviewed: 09/04/2007 [facility] Patient Information 2014 [place], [place].  Hypoglycemia (Low Blood Sugar) Hypoglycemia is when the glucose (sugar) in your blood is too low. Hypoglycemia can happen for many reasons. It can happen to people with or without diabetes. Hypoglycemia can develop quickly and can be a medical emergency.  CAUSES  Having hypoglycemia does not mean that you will develop diabetes. Different causes include:  Missed or delayed meals or not enough carbohydrates eaten.  Medication overdose. This could be by accident or deliberate. If by accident, your medication may need to be adjusted or changed.  Exercise or increased activity without adjustments in carbohydrates or medications.  A nerve disorder that affects body functions like your heart rate, blood pressure and digestion (autonomic neuropathy).  A condition where the stomach muscles do not function properly (gastroparesis). Therefore, medications may not absorb properly.  The inability to recognize the signs of hypoglycemia (hypoglycemic unawareness).  Absorption of insulin  may be altered.  Alcohol consumption.  Pregnancy/menstrual cycles/postpartum. This may be due to hormones.  Certain kinds of tumors. This is very rare. SYMPTOMS   Sweating.  Hunger.  Dizziness.  Blurred vision.  Drowsiness.  Weakness.  Headache.  Rapid heart beat.  Shakiness.  Nervousness. DIAGNOSIS  Diagnosis is made by monitoring blood glucose in one or all of the following ways:  Fingerstick blood glucose monitoring.  Laboratory results. TREATMENT  If you think your blood glucose is low:  Check  your blood glucose, if possible. If it is less than 70 mg/dl, take one of the following:  3-4 glucose tablets.   cup juice (prefer clear like apple).   cup "regular" soda pop.  1 cup milk.  -1 tube of glucose gel.  5-6 hard candies.  Do not over treat because your blood glucose (sugar) will only go too high.  Wait 15 minutes and recheck your blood glucose. If it is still less than 70 mg/dl (or below your target range), repeat treatment.  Eat a snack if it is more than one hour until your next meal. Sometimes, your blood glucose may go so low that you are unable to treat yourself. You may need someone to help you. You may even pass out or be unable to swallow. This may require you to get an injection of glucagon, which raises the blood glucose. HOME CARE INSTRUCTIONS  Check blood glucose as recommended by your caregiver.  Take medication as prescribed by your caregiver.  Follow your meal plan. Do not skip meals. Eat on time.  If you are going to drink alcohol, drink it only with meals.  Check your blood glucose before driving.  Check your blood glucose before and after exercise. If you exercise longer or different than usual, be sure to check blood glucose more frequently.  Always carry treatment with you. Glucose tablets are the easiest to carry.  Always wear medical alert jewelry or carry some form of identification that states that you have diabetes. This will alert people that you have diabetes. If you have hypoglycemia, they will have a better idea on what to do. SEEK MEDICAL CARE IF:  You are having problems keeping your blood sugar at target range.  You are having frequent episodes of hypoglycemia.  You feel you might be having side effects from your medicines.  You have symptoms of an illness that is not improving after 3-4 days.  You notice a change in vision or a new problem with your vision. SEEK IMMEDIATE MEDICAL CARE IF:   You are a family member or  friend of a person whose blood glucose goes below 70 mg/dl and is accompanied by:  Confusion.  A change in mental status.  The inability to swallow.  Passing out. Document Released: 01/14/2005 Document Revised: 04/08/2011 Document Reviewed: 05/13/2011 [facility] Patient Information 2014 [place], [place].    Treatment Goals:  Goals (1 Years of Data) as of 08/26/12         As of Today 08/13/12 08/12/12 07/28/12 07/03/12     Blood Pressure    . Blood Pressure < 140/90  101/64 104/67 126/78 124/75 100/58     Result Component    . HEMOGLOBIN A1C < 7.0          . LDL CALC < 100            Progress Toward Treatment Goals:  Treatment Goal 08/26/2012  Hemoglobin A1C unable to assess  Blood pressure at goal    Self Care Goals & Plans:  Self Care Goal 08/26/2012  Manage my medications take my medicines as prescribed; bring my medications to every visit; refill my medications on time  Monitor my health keep track of my blood glucose; bring my glucose meter and log to each visit; keep track of my blood pressure; bring my blood pressure log to each visit; keep track of my weight  Eat healthy foods drink diet soda or water instead of juice or soda; eat more vegetables; eat foods that are low in salt; eat baked foods instead of fried foods; eat fruit for snacks and desserts; eat smaller portions  Be physically active find an activity I enjoy  Meeting treatment goals maintain the current self-care plan    Home Blood Glucose Monitoring 08/26/2012  Check my blood sugar 3 times a day  When to check my blood sugar before meals; at bedtime     Care Management & Community Referrals:  Referral 08/26/2012  Referrals made for care management support diabetes educator  Referrals made to community resources none

## 2012-08-27 ENCOUNTER — Other Ambulatory Visit: Payer: Self-pay | Admitting: *Deleted

## 2012-08-27 ENCOUNTER — Encounter: Payer: Medicare Other | Admitting: Internal Medicine

## 2012-08-27 ENCOUNTER — Ambulatory Visit: Payer: Medicare Other | Admitting: Dietician

## 2012-08-27 DIAGNOSIS — M79602 Pain in left arm: Secondary | ICD-10-CM

## 2012-08-27 DIAGNOSIS — E119 Type 2 diabetes mellitus without complications: Secondary | ICD-10-CM

## 2012-08-27 MED ORDER — "INSULIN SYRINGE-NEEDLE U-100 30G X 1/2"" 0.5 ML MISC": Status: DC

## 2012-09-01 ENCOUNTER — Encounter: Payer: Medicare Other | Admitting: Internal Medicine

## 2012-09-09 ENCOUNTER — Encounter: Payer: Medicare Other | Admitting: Dietician

## 2012-09-09 ENCOUNTER — Ambulatory Visit (INDEPENDENT_AMBULATORY_CARE_PROVIDER_SITE_OTHER): Payer: Medicare Other | Admitting: Emergency Medicine

## 2012-09-09 ENCOUNTER — Ambulatory Visit (HOSPITAL_COMMUNITY)
Admission: RE | Admit: 2012-09-09 | Discharge: 2012-09-09 | Disposition: A | Discharge status: Home / Self Care | Payer: Medicare Other | Source: Ambulatory Visit | Attending: Emergency Medicine | Admitting: Emergency Medicine

## 2012-09-09 ENCOUNTER — Encounter: Payer: Self-pay | Admitting: Emergency Medicine

## 2012-09-09 VITALS — BP 149/85 | HR 96 | Ht 63.0 in | Wt 163.0 lb

## 2012-09-09 DIAGNOSIS — M7552 Bursitis of left shoulder: Secondary | ICD-10-CM

## 2012-09-09 DIAGNOSIS — M719 Bursopathy, unspecified: Secondary | ICD-10-CM

## 2012-09-09 DIAGNOSIS — M67919 Unspecified disorder of synovium and tendon, unspecified shoulder: Secondary | ICD-10-CM

## 2012-09-09 DIAGNOSIS — M7582 Other shoulder lesions, left shoulder: Secondary | ICD-10-CM

## 2012-09-09 DIAGNOSIS — M25519 Pain in unspecified shoulder: Secondary | ICD-10-CM

## 2012-09-09 DIAGNOSIS — M25512 Pain in left shoulder: Secondary | ICD-10-CM

## 2012-09-09 DIAGNOSIS — M7580 Other shoulder lesions, unspecified shoulder: Secondary | ICD-10-CM | POA: Insufficient documentation

## 2012-09-09 DIAGNOSIS — M75100 Unspecified rotator cuff tear or rupture of unspecified shoulder, not specified as traumatic: Secondary | ICD-10-CM | POA: Diagnosis not present

## 2012-09-09 MED ORDER — MELOXICAM 15 MG PO TABS: 15.0000 mg | ORAL_TABLET | Freq: Every day | ORAL | Status: DC

## 2012-09-09 NOTE — Progress Notes (Signed)
  Subjective:    Patient ID: [name], female    DOB: 01-Mar-1950, 62 y.o.   MRN: [id number]  HPI A 62 year old female who presents for followup of left subacromial bursitis and rotator cuff tendinitis. She was seen in the sports medicine clinic one month ago and received an injection into the left subacromial bursa. She's had continued and persistent pain since then with minimal to no relief from the injection. Pain is worse with activities involving reaching over her head. She's been favoring the left shoulder significantly recently. She also notes a low left trapezius muscle pain and tightness which is worse with movement of her head or shrugging her shoulders. She's been taking when necessary Advil and Aleve with no significant relief. She denies any new injury. She feels her range of motion is worsening. Past Medical History  Diagnosis Date  . Allergy   . Anemia   . Depression   . Arthritis   . Cancer   . Cataract   . Diabetes mellitus without complication   . Glaucoma   . Hyperlipidemia   . Hypertension   . Chronic kidney disease   . Neuromuscular disorder   . Thyroid disease   . Left arm pain 07/28/2012  . BREAST CANCER, HX OF 04/16/2006    Qualifier: Diagnosis of  By: Audria Nine MD, Britta Mccreedy    . Diverticulosis    Past Surgical History  Procedure Laterality Date  . Abdominal hysterectomy    . Breast surgery    . Eye surgery    . Tubal ligation     Allergies  Allergen Reactions  . Gabapentin     dizziness  . Penicillins   . Sulfonamide Derivatives       Review of Systems Review of systems as per history of present illness otherwise negative    Objective:   Physical Exam BP 149/85  Pulse 96  Ht 5\' 3"  (1.6 m)  Wt 163 lb (73.936 kg)  BMI 28.88 kg/m2 This is a well-developed well-nourished 62 year old female awake alert oriented in no acute distress. Left Shoulder: Inspection reveals no atrophy or asymmetry. Palpation is normal with no tenderness over AC joint  or bicipital groove. ROM is decreased in abduction, internal rotation, forward flexion, with normal external rotation. Rotator cuff strength is decreased secondary to pain. signs of impingement with positive Neer and Hawkin's tests, empty can. Normal scapular function observed. Positive painful arc. No apprehension sign  Neurovascularly intact bilateral upper extremities.  Musculoskeletal ultrasound of the left shoulder was performed. Images were obtained in short and long axis views of the subscapularis tendon supraspinatus tendon infraspinatus tendon and the bicipital tendon. In addition view was obtained of the glenohumeral joint. There is evidence of some fluid around the supra-spinatus tendon no definitive tear seen. Findings consistent with left rotator cuff tendinitis.

## 2012-09-09 NOTE — Assessment & Plan Note (Signed)
Patient did not receive significant relief with prior injection 3 weeks ago. Her exam and ultrasound findings consistent with rotator cuff tendinitis however we cannot rule out rotator cuff tear. At this time we are ordering x-rays and an MRI of her left shoulder. She'll either followup in the clinic after the MRI or I will call her if there is evidence of a tear and make arrangements for her to see an orthopedist to discuss more definitive treatment

## 2012-09-09 NOTE — Patient Instructions (Addendum)
Rotator Cuff Injury The rotator cuff is the collective set of muscles and tendons that make up the stabilizing unit of your shoulder. This unit holds in the ball of the humerus (upper arm bone) in the socket of the scapula (shoulder blade). Injuries to this stabilizing unit most commonly come from sports or activities that cause the arm to be moved repeatedly over the head. Examples of this include throwing, weight lifting, swimming, racquet sports, or an injury such as falling on your arm. Chronic (longstanding) irritation of this unit can cause inflammation (soreness), bursitis, and eventual damage to the tendons to the point of rupture (tear). An acute (sudden) injury of the rotator cuff can result in a partial or complete tear. You may need surgery with complete tears. Small or partial rotator cuff tears may be treated conservatively with temporary immobilization, exercises and rest. Physical therapy may be needed. HOME CARE INSTRUCTIONS   Apply ice to the injury for 15-20 minutes 3-4 times per day for the first 2 days. Put the ice in a plastic bag and place a towel between the bag of ice and your skin.  If you have a shoulder immobilizer (sling and straps), do not remove it for as long as directed by your caregiver or until you see a caregiver for a follow-up examination. If you need to remove it, move your arm as little as possible.  You may want to sleep on several pillows or in a recliner at night to lessen swelling and pain.  Only take over-the-counter or prescription medicines for pain, discomfort, or fever as directed by your caregiver.  Do simple hand squeezing exercises with a soft rubber ball to decrease hand swelling. SEEK MEDICAL CARE IF:   Pain in your shoulder increases or new pain or numbness develops in your arm, hand, or fingers.  Your hand or fingers are colder than your other hand. SEEK IMMEDIATE MEDICAL CARE IF:   Your arm, hand, or fingers are numb or tingling.  Your  arm, hand, or fingers are increasingly swollen and painful, or turn white or blue. Document Released: 01/12/2000 Document Revised: 04/08/2011 Document Reviewed: 01/05/2008 [facility] Patient Information 2014 [place], [place].   You have been scheduled for a MRI on 09/14/12 @ 12 pm at [facility].  Please arrive at Radiology at 11:45am.

## 2012-09-14 ENCOUNTER — Ambulatory Visit (HOSPITAL_COMMUNITY)
Admission: RE | Admit: 2012-09-14 | Discharge: 2012-09-14 | Disposition: A | Discharge status: Home / Self Care | Payer: Medicare Other | Source: Ambulatory Visit | Attending: Emergency Medicine | Admitting: Emergency Medicine

## 2012-09-14 DIAGNOSIS — M67919 Unspecified disorder of synovium and tendon, unspecified shoulder: Secondary | ICD-10-CM | POA: Insufficient documentation

## 2012-09-14 DIAGNOSIS — M719 Bursopathy, unspecified: Secondary | ICD-10-CM | POA: Insufficient documentation

## 2012-09-14 DIAGNOSIS — M19019 Primary osteoarthritis, unspecified shoulder: Secondary | ICD-10-CM | POA: Diagnosis not present

## 2012-09-14 DIAGNOSIS — Z471 Aftercare following joint replacement surgery: Secondary | ICD-10-CM | POA: Diagnosis not present

## 2012-09-14 DIAGNOSIS — Z96619 Presence of unspecified artificial shoulder joint: Secondary | ICD-10-CM | POA: Diagnosis not present

## 2012-09-14 DIAGNOSIS — M6580 Other synovitis and tenosynovitis, unspecified site: Secondary | ICD-10-CM | POA: Diagnosis not present

## 2012-09-14 DIAGNOSIS — M25512 Pain in left shoulder: Secondary | ICD-10-CM

## 2012-09-17 DIAGNOSIS — E11319 Type 2 diabetes mellitus with unspecified diabetic retinopathy without macular edema: Secondary | ICD-10-CM | POA: Diagnosis not present

## 2012-09-17 DIAGNOSIS — H4011X0 Primary open-angle glaucoma, stage unspecified: Secondary | ICD-10-CM | POA: Diagnosis not present

## 2012-09-17 DIAGNOSIS — H409 Unspecified glaucoma: Secondary | ICD-10-CM | POA: Diagnosis not present

## 2012-09-30 ENCOUNTER — Ambulatory Visit (INDEPENDENT_AMBULATORY_CARE_PROVIDER_SITE_OTHER): Payer: Medicare Other | Admitting: Emergency Medicine

## 2012-09-30 ENCOUNTER — Encounter: Payer: Self-pay | Admitting: Emergency Medicine

## 2012-09-30 VITALS — BP 130/77 | HR 104 | Ht 63.0 in | Wt 163.0 lb

## 2012-09-30 DIAGNOSIS — M75100 Unspecified rotator cuff tear or rupture of unspecified shoulder, not specified as traumatic: Secondary | ICD-10-CM | POA: Diagnosis not present

## 2012-09-30 DIAGNOSIS — M7582 Other shoulder lesions, left shoulder: Secondary | ICD-10-CM

## 2012-09-30 DIAGNOSIS — M67919 Unspecified disorder of synovium and tendon, unspecified shoulder: Secondary | ICD-10-CM

## 2012-09-30 NOTE — Assessment & Plan Note (Signed)
MRI results consistent with rotator cuff tendinopathy were reviewed in the office today. Overall her symptoms are slowly improving. She declined formal physical therapy today. She will continue home exercises and by mouth meloxicam. She'll followup in the next 6-8 weeks as needed. If her symptoms worsen she will call for an earlier appointment.

## 2012-09-30 NOTE — Progress Notes (Signed)
Patient ID: [name], female   DOB: 06/25/1950, 63 y.o.   MRN: [id number] Is a 62 year old female who presents for followup from recent MRI of her left shoulder. I seen her several times in the office of the past 2 months for left shoulder pain. She corticosteroid injection which initially did not seem to improve her symptoms do significantly so MRI was ordered and her first followup appointment.   At today's visit she is reporting that her symptoms are starting to improve somewhat. She has increased range of motion and decreased pain. She is not at this time pain-free however. She's been doing home rotator cuff exercises with theraband.  She's also been taking meloxicam.  Examination: BP 130/77  Pulse 104  Ht 5\' 3"  (1.6 m)  Wt 163 lb (73.936 kg)  BMI 28.88 kg/m2 Is a well-developed well-nourished 62 year old [place] female awake alert oriented in no acute distress  Left shoulder examination: Range of motion:  ABD 135, FF 170, ER 40, IR L4. Mild tenderness to biceps tendon Positive push off test Positive Hawkins Positive Yergason's  Neurovascularly intact.  MRI:  Results consistent with rotator cuff/bicepital tendon tendonopathy.

## 2012-10-13 ENCOUNTER — Other Ambulatory Visit: Payer: Self-pay | Admitting: Internal Medicine

## 2012-10-16 ENCOUNTER — Other Ambulatory Visit: Payer: Self-pay | Admitting: Internal Medicine

## 2012-10-25 ENCOUNTER — Other Ambulatory Visit: Payer: Self-pay | Admitting: Internal Medicine

## 2012-11-05 ENCOUNTER — Telehealth: Payer: Self-pay | Admitting: Dietician

## 2012-11-05 NOTE — Telephone Encounter (Signed)
Called patient to follow up on blood sugars, signs and symptoms of hypoglycemia, new meter and schedule follow up visit.   Patient denies frequent symptoms of hypoglycemia, says her lowest blood sugar was 69 over the past month, not wsking up during the night, but has trouble falling asleep. She thinks the blood sugar of 69 was before lunch and cannot remember if she had symptoms. She was not able to get strips for her new meter, requests flu vaccine next week when she is here with her daughter ( had front office put her on the flu clinic schedule) and agreed to follow up appointment same day as she sees her doctor on 11-26-12.

## 2012-11-05 NOTE — Telephone Encounter (Signed)
Thanks, Lupita Leash. If she is having hypoglycemia it is reasonable to back off on basal dose.

## 2012-11-09 NOTE — Telephone Encounter (Signed)
Left messages requesting return call to acknowledge new insulin order of 55 units. Have not received a return call

## 2012-11-10 ENCOUNTER — Ambulatory Visit (INDEPENDENT_AMBULATORY_CARE_PROVIDER_SITE_OTHER): Payer: Medicare Other | Admitting: *Deleted

## 2012-11-10 DIAGNOSIS — E785 Hyperlipidemia, unspecified: Secondary | ICD-10-CM | POA: Diagnosis not present

## 2012-11-10 DIAGNOSIS — E119 Type 2 diabetes mellitus without complications: Secondary | ICD-10-CM | POA: Diagnosis not present

## 2012-11-10 DIAGNOSIS — E039 Hypothyroidism, unspecified: Secondary | ICD-10-CM | POA: Diagnosis not present

## 2012-11-10 DIAGNOSIS — F329 Major depressive disorder, single episode, unspecified: Secondary | ICD-10-CM | POA: Diagnosis not present

## 2012-11-10 DIAGNOSIS — Z23 Encounter for immunization: Secondary | ICD-10-CM | POA: Diagnosis not present

## 2012-11-10 DIAGNOSIS — N183 Chronic kidney disease, stage 3 unspecified: Secondary | ICD-10-CM | POA: Diagnosis not present

## 2012-11-10 DIAGNOSIS — I10 Essential (primary) hypertension: Secondary | ICD-10-CM | POA: Diagnosis not present

## 2012-11-13 ENCOUNTER — Other Ambulatory Visit: Payer: Self-pay | Admitting: *Deleted

## 2012-11-13 DIAGNOSIS — M7552 Bursitis of left shoulder: Secondary | ICD-10-CM

## 2012-11-13 MED ORDER — MELOXICAM 15 MG PO TABS: 15.0000 mg | ORAL_TABLET | Freq: Every day | ORAL | Status: DC

## 2012-11-26 ENCOUNTER — Ambulatory Visit (INDEPENDENT_AMBULATORY_CARE_PROVIDER_SITE_OTHER): Payer: Medicare Other | Admitting: Internal Medicine

## 2012-11-26 ENCOUNTER — Ambulatory Visit: Payer: Medicare Other | Admitting: Dietician

## 2012-11-26 ENCOUNTER — Other Ambulatory Visit: Payer: Self-pay | Admitting: Internal Medicine

## 2012-11-26 ENCOUNTER — Encounter: Payer: Self-pay | Admitting: Internal Medicine

## 2012-11-26 VITALS — BP 136/76 | HR 91 | Temp 98.5°F | Ht 63.0 in | Wt 164.0 lb

## 2012-11-26 DIAGNOSIS — E11319 Type 2 diabetes mellitus with unspecified diabetic retinopathy without macular edema: Secondary | ICD-10-CM | POA: Diagnosis not present

## 2012-11-26 DIAGNOSIS — G8929 Other chronic pain: Secondary | ICD-10-CM

## 2012-11-26 DIAGNOSIS — M79609 Pain in unspecified limb: Secondary | ICD-10-CM

## 2012-11-26 DIAGNOSIS — E1129 Type 2 diabetes mellitus with other diabetic kidney complication: Secondary | ICD-10-CM

## 2012-11-26 DIAGNOSIS — E785 Hyperlipidemia, unspecified: Secondary | ICD-10-CM | POA: Diagnosis not present

## 2012-11-26 DIAGNOSIS — N183 Chronic kidney disease, stage 3 unspecified: Secondary | ICD-10-CM | POA: Diagnosis not present

## 2012-11-26 DIAGNOSIS — M79602 Pain in left arm: Secondary | ICD-10-CM

## 2012-11-26 DIAGNOSIS — E1139 Type 2 diabetes mellitus with other diabetic ophthalmic complication: Secondary | ICD-10-CM | POA: Diagnosis not present

## 2012-11-26 DIAGNOSIS — R21 Rash and other nonspecific skin eruption: Secondary | ICD-10-CM | POA: Diagnosis not present

## 2012-11-26 DIAGNOSIS — E1121 Type 2 diabetes mellitus with diabetic nephropathy: Secondary | ICD-10-CM

## 2012-11-26 DIAGNOSIS — E119 Type 2 diabetes mellitus without complications: Secondary | ICD-10-CM

## 2012-11-26 DIAGNOSIS — L280 Lichen simplex chronicus: Secondary | ICD-10-CM | POA: Diagnosis not present

## 2012-11-26 LAB — POCT GLYCOSYLATED HEMOGLOBIN (HGB A1C): Hemoglobin A1C: 8.9

## 2012-11-26 LAB — GLUCOSE, CAPILLARY: Glucose-Capillary: 199 mg/dL — ABNORMAL HIGH (ref 70–99)

## 2012-11-26 MED ORDER — INSULIN ASPART 100 UNIT/ML FLEXPEN: 12.0000 [IU] | PEN_INJECTOR | Freq: Three times a day (TID) | SUBCUTANEOUS | Status: DC

## 2012-11-26 NOTE — Progress Notes (Signed)
Patient did not have a visit with CDE today.

## 2012-11-26 NOTE — Patient Instructions (Signed)
-  Decrease Novolog to 12 units three times daily. -I have referred you to dermatology. -Return to clinic in 3 months.

## 2012-11-26 NOTE — Progress Notes (Signed)
Subjective:    Patient ID: [name], female    DOB: 1950/03/31, 62 y.o.   MRN: [id number]  Rash Pertinent negatives include no congestion, cough, diarrhea, eye pain, fever, shortness of breath or vomiting.   Feels well.  States she's had a developing rash in her left upper arm that started in June of this year. States she had a "dye" injected in her LUE and the rash has come and gone since then. She states she has tried putting over the counter cream on it, with some improvement, then it returns.  Denies any CP, SOB.  Denies any N/V/D.  Denies any hematochezia or melena. She has had three episodes of hypoglycemia. She decreased Levemir dose as advised to 55 units. She has had no further episodes of hypoglycemia.  Her HgbA1c is 8.9%, which is slowly improving from a value of over 14% back in May 2014.   Review of Systems  Constitutional: Negative for fever, chills, activity change and appetite change.  HENT: Negative for congestion, hearing loss, trouble swallowing and voice change.   Eyes: Negative for photophobia and pain.  Respiratory: Negative for apnea, cough, chest tightness and shortness of breath.   Cardiovascular: Negative for chest pain and leg swelling.  Gastrointestinal: Negative for nausea, vomiting, abdominal pain, diarrhea, constipation, blood in stool, abdominal distention and rectal pain.  Endocrine: Negative for cold intolerance, polydipsia and polyphagia.  Genitourinary: Negative for urgency, difficulty urinating and dyspareunia.  Musculoskeletal: Negative for arthralgias.  Skin: Positive for rash.  Neurological: Negative for dizziness, light-headedness and headaches.  Psychiatric/Behavioral: Negative for behavioral problems and agitation.       Objective:   Physical Exam  Constitutional: She appears well-developed and well-nourished. No distress.  HENT:  Head: Normocephalic and atraumatic.  Eyes: Conjunctivae and EOM are normal. Pupils are equal, round, and  reactive to light.  Neck: Normal range of motion. No JVD present. No tracheal deviation present. No thyromegaly present.  Cardiovascular: Normal rate, regular rhythm and normal heart sounds.  Exam reveals no friction rub.   No murmur heard. Pulmonary/Chest: Effort normal and breath sounds normal. No respiratory distress. She has no wheezes.  Abdominal: Soft. Bowel sounds are normal. She exhibits no distension. There is no tenderness. There is no rebound and no guarding.  Musculoskeletal: Normal range of motion. She exhibits no edema.  Skin: Skin is warm. Rash noted. Rash is nodular. She is not diaphoretic. No erythema.     Psychiatric: She has a normal mood and affect.         Assessment & Plan:  61 yr. 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   1) IDDM type 2: Decrease Novolog to 12 units three times daily.  2) CKD 3: No microalbuminuria. Stable.  3) OA: Tylenol 325 mg po q8hrs pain prn.  3) Diabetic retinopathy: Continue to follow up with current ophthalmologist.  4) Glaucoma: Continue current tx, continue to follow with ophthalmologist.  5) HTN: Controlled. 6) HL: Pravastatin to 40 mg daily. 10 year risk is 11.9%.  7) Hypothyroidism: FT4 and TSH WNL. Cont current dose of synthroid.  8) Depression: States effexor is helping her. Denies suicidal thoughts or ideation. No changes today.  9) Skin Rash: Refer to dermatology, I am not certain of the etiology. 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 She is following with opthalmology. States she's had flu vaccine this year at [facility] (9/13). States she's had pneumovax already. She is following with Dr. Cyndie Chime for hx breast CA, no  recurrence in 14 years.

## 2012-12-07 ENCOUNTER — Other Ambulatory Visit: Payer: Self-pay | Admitting: Internal Medicine

## 2012-12-22 DIAGNOSIS — H18419 Arcus senilis, unspecified eye: Secondary | ICD-10-CM | POA: Diagnosis not present

## 2012-12-22 DIAGNOSIS — H11159 Pinguecula, unspecified eye: Secondary | ICD-10-CM | POA: Diagnosis not present

## 2012-12-22 DIAGNOSIS — H25019 Cortical age-related cataract, unspecified eye: Secondary | ICD-10-CM | POA: Diagnosis not present

## 2012-12-22 DIAGNOSIS — E119 Type 2 diabetes mellitus without complications: Secondary | ICD-10-CM | POA: Diagnosis not present

## 2012-12-25 DIAGNOSIS — H4011X0 Primary open-angle glaucoma, stage unspecified: Secondary | ICD-10-CM | POA: Diagnosis not present

## 2012-12-25 DIAGNOSIS — E119 Type 2 diabetes mellitus without complications: Secondary | ICD-10-CM | POA: Diagnosis not present

## 2012-12-25 DIAGNOSIS — H18419 Arcus senilis, unspecified eye: Secondary | ICD-10-CM | POA: Diagnosis not present

## 2012-12-25 DIAGNOSIS — H11159 Pinguecula, unspecified eye: Secondary | ICD-10-CM | POA: Diagnosis not present

## 2013-01-18 ENCOUNTER — Other Ambulatory Visit: Payer: Self-pay | Admitting: *Deleted

## 2013-01-18 DIAGNOSIS — E785 Hyperlipidemia, unspecified: Secondary | ICD-10-CM

## 2013-01-18 DIAGNOSIS — G8929 Other chronic pain: Secondary | ICD-10-CM

## 2013-01-18 DIAGNOSIS — E1121 Type 2 diabetes mellitus with diabetic nephropathy: Secondary | ICD-10-CM

## 2013-01-18 DIAGNOSIS — E119 Type 2 diabetes mellitus without complications: Secondary | ICD-10-CM

## 2013-01-18 MED ORDER — ACETAMINOPHEN 325 MG PO TABS: 325.0000 mg | ORAL_TABLET | Freq: Three times a day (TID) | ORAL | Status: DC | PRN

## 2013-01-23 ENCOUNTER — Other Ambulatory Visit: Payer: Self-pay | Admitting: Internal Medicine

## 2013-02-09 ENCOUNTER — Other Ambulatory Visit: Payer: Self-pay | Admitting: *Deleted

## 2013-02-09 DIAGNOSIS — E119 Type 2 diabetes mellitus without complications: Secondary | ICD-10-CM

## 2013-02-09 DIAGNOSIS — M79602 Pain in left arm: Secondary | ICD-10-CM

## 2013-02-09 MED ORDER — VENLAFAXINE HCL ER 75 MG PO CP24: 75.0000 mg | ORAL_CAPSULE | Freq: Every day | ORAL | Status: DC

## 2013-02-09 MED ORDER — METFORMIN HCL 1000 MG PO TABS: 1000.0000 mg | ORAL_TABLET | Freq: Two times a day (BID) | ORAL | Status: DC

## 2013-02-17 ENCOUNTER — Other Ambulatory Visit: Payer: Self-pay | Admitting: *Deleted

## 2013-02-17 DIAGNOSIS — Z0000 Encounter for general adult medical examination without abnormal findings: Secondary | ICD-10-CM

## 2013-02-17 DIAGNOSIS — I10 Essential (primary) hypertension: Secondary | ICD-10-CM

## 2013-02-17 DIAGNOSIS — E1121 Type 2 diabetes mellitus with diabetic nephropathy: Secondary | ICD-10-CM

## 2013-02-17 DIAGNOSIS — E11319 Type 2 diabetes mellitus with unspecified diabetic retinopathy without macular edema: Secondary | ICD-10-CM

## 2013-02-17 DIAGNOSIS — Z139 Encounter for screening, unspecified: Secondary | ICD-10-CM

## 2013-02-17 DIAGNOSIS — E119 Type 2 diabetes mellitus without complications: Secondary | ICD-10-CM

## 2013-02-17 DIAGNOSIS — E785 Hyperlipidemia, unspecified: Secondary | ICD-10-CM

## 2013-02-17 DIAGNOSIS — E1140 Type 2 diabetes mellitus with diabetic neuropathy, unspecified: Secondary | ICD-10-CM

## 2013-02-17 DIAGNOSIS — M1990 Unspecified osteoarthritis, unspecified site: Secondary | ICD-10-CM

## 2013-02-17 DIAGNOSIS — E039 Hypothyroidism, unspecified: Secondary | ICD-10-CM

## 2013-02-17 MED ORDER — ACETAMINOPHEN 500 MG PO TABS: 500.0000 mg | ORAL_TABLET | Freq: Four times a day (QID) | ORAL | Status: DC | PRN

## 2013-02-23 DIAGNOSIS — E11319 Type 2 diabetes mellitus with unspecified diabetic retinopathy without macular edema: Secondary | ICD-10-CM | POA: Diagnosis not present

## 2013-02-23 DIAGNOSIS — H2510 Age-related nuclear cataract, unspecified eye: Secondary | ICD-10-CM | POA: Diagnosis not present

## 2013-02-23 DIAGNOSIS — H02839 Dermatochalasis of unspecified eye, unspecified eyelid: Secondary | ICD-10-CM | POA: Diagnosis not present

## 2013-02-23 DIAGNOSIS — H18419 Arcus senilis, unspecified eye: Secondary | ICD-10-CM | POA: Diagnosis not present

## 2013-03-25 ENCOUNTER — Ambulatory Visit: Payer: Medicare Other | Admitting: Internal Medicine

## 2013-03-27 ENCOUNTER — Encounter: Payer: Self-pay | Admitting: Oncology

## 2013-03-27 ENCOUNTER — Telehealth: Payer: Self-pay | Admitting: Oncology

## 2013-03-27 NOTE — Telephone Encounter (Signed)
, °

## 2013-04-05 DIAGNOSIS — M79609 Pain in unspecified limb: Secondary | ICD-10-CM | POA: Diagnosis not present

## 2013-04-09 ENCOUNTER — Encounter: Payer: Self-pay | Admitting: Internal Medicine

## 2013-04-09 ENCOUNTER — Ambulatory Visit (INDEPENDENT_AMBULATORY_CARE_PROVIDER_SITE_OTHER): Payer: Medicare Other | Admitting: Internal Medicine

## 2013-04-09 VITALS — BP 155/87 | HR 85 | Temp 98.0°F | Ht 63.0 in | Wt 165.4 lb

## 2013-04-09 DIAGNOSIS — E1142 Type 2 diabetes mellitus with diabetic polyneuropathy: Secondary | ICD-10-CM

## 2013-04-09 DIAGNOSIS — M79609 Pain in unspecified limb: Secondary | ICD-10-CM | POA: Diagnosis not present

## 2013-04-09 DIAGNOSIS — R21 Rash and other nonspecific skin eruption: Secondary | ICD-10-CM | POA: Diagnosis not present

## 2013-04-09 DIAGNOSIS — E1149 Type 2 diabetes mellitus with other diabetic neurological complication: Secondary | ICD-10-CM

## 2013-04-09 DIAGNOSIS — E11319 Type 2 diabetes mellitus with unspecified diabetic retinopathy without macular edema: Secondary | ICD-10-CM | POA: Diagnosis not present

## 2013-04-09 DIAGNOSIS — E119 Type 2 diabetes mellitus without complications: Secondary | ICD-10-CM

## 2013-04-09 DIAGNOSIS — E1140 Type 2 diabetes mellitus with diabetic neuropathy, unspecified: Secondary | ICD-10-CM | POA: Insufficient documentation

## 2013-04-09 DIAGNOSIS — E1139 Type 2 diabetes mellitus with other diabetic ophthalmic complication: Secondary | ICD-10-CM | POA: Diagnosis not present

## 2013-04-09 DIAGNOSIS — E785 Hyperlipidemia, unspecified: Secondary | ICD-10-CM | POA: Diagnosis not present

## 2013-04-09 DIAGNOSIS — N183 Chronic kidney disease, stage 3 unspecified: Secondary | ICD-10-CM | POA: Diagnosis not present

## 2013-04-09 LAB — GLUCOSE, CAPILLARY
Glucose-Capillary: 57 mg/dL — ABNORMAL LOW (ref 70–99)
Glucose-Capillary: 84 mg/dL (ref 70–99)

## 2013-04-09 LAB — POCT GLYCOSYLATED HEMOGLOBIN (HGB A1C): Hemoglobin A1C: 9.5

## 2013-04-09 MED ORDER — INSULIN ASPART 100 UNIT/ML FLEXPEN: 14.0000 [IU] | PEN_INJECTOR | Freq: Three times a day (TID) | SUBCUTANEOUS | Status: DC

## 2013-04-09 MED ORDER — INSULIN DETEMIR 100 UNIT/ML FLEXPEN: 57.0000 [IU] | PEN_INJECTOR | Freq: Every morning | SUBCUTANEOUS | Status: DC

## 2013-04-09 NOTE — Patient Instructions (Signed)
General Instructions:  We have changed your insulin regimen. Start taking 57 units of the Levemir and 14 units of the Novolog and eat a full meal.   Your arm pain is likely from your high blood sugars. Follow-up in 3 months.  Please bring your medicines with you each time you come.   Medicines may be:  Eye drops  Herbal   Vitamins  Pills  Seeing these help [place] take care of you.  Treatment Goals:  Goals (1 Years of Data) as of 04/09/13         As of Today 11/26/12 09/30/12 09/09/12 08/26/12     Blood Pressure    . Blood Pressure < 140/90  155/87 136/76 130/77 149/85 101/64     Result Component    . HEMOGLOBIN A1C < 7.0  9.5 8.9   9.0    . LDL CALC < 100            Progress Toward Treatment Goals:  Treatment Goal 04/09/2013  Hemoglobin A1C deteriorated  Blood pressure deteriorated    Self Care Goals & Plans:  Self Care Goal 11/26/2012  Manage my medications take my medicines as prescribed; bring my medications to every visit; refill my medications on time  Monitor my health keep track of my blood glucose; bring my glucose meter and log to each visit; keep track of my blood pressure  Eat healthy foods drink diet soda or water instead of juice or soda; eat more vegetables; eat foods that are low in salt; eat baked foods instead of fried foods  Be physically active -  Meeting treatment goals -    Home Blood Glucose Monitoring 08/26/2012  Check my blood sugar 3 times a day  When to check my blood sugar before meals; at bedtime     Care Management & Community Referrals:  Referral 08/26/2012  Referrals made for care management support diabetes educator  Referrals made to community resources none

## 2013-04-09 NOTE — Assessment & Plan Note (Signed)
Pt reports that she is starting to experience tingling in her right fingers and arms.  Has had worsening of her diabetic control with average HgbA1c ~9.  Will plan to manage first with better control and defer gabapentin for now in setting of recent hypoglycemia of 57 today in clinic.  -readdress neuropathic pain therapy at next visit

## 2013-04-09 NOTE — Progress Notes (Signed)
   Subjective:    Patient ID: [name], female    DOB: September 22, 1950, 63 y.o.   MRN: [id number]  HPI  Pt presents for follow-up of diabetes.  Hx is significnat for poorly controlled diabetes on Levemir and mealtime Novolog.  Last eval Nov 2104 whereby Novolog was decreased to 12 units secondary to several hypoglycemic recordings without symptoms.  Her Levemir dose was previously decreased from 60 units to 55 units last summer.  Her glucometer demonstrates most readings above 200 and many above 300 especially after meals.  Morning time reading >160.  She does not eat breakfast much because she "sleeps in".  Has been under much stress secondary to daughters dx of metastatic breast cancer thus has not been adhering to an appropriate diet. CBG in clinic 57 today and 84 after juice.  Pt state that she hasnt eaten for ~4 hours or so.  Review of Systems  Constitutional: Negative for fever and fatigue.  HENT: Negative.   Respiratory: Negative for shortness of breath.   Cardiovascular: Negative for chest pain, palpitations and leg swelling.  Genitourinary: Negative for dysuria.  Musculoskeletal:       Tingling in right arm and not being able to have a strong grip at times  Neurological: Negative for dizziness and headaches.  Hematological: Does not bruise/bleed easily.  Psychiatric/Behavioral: Negative.        Objective:   Physical Exam  Constitutional: She is oriented to person, place, and time. She appears well-developed and well-nourished. No distress.  HENT:  Head: Normocephalic and atraumatic.  Eyes: Conjunctivae and EOM are normal. Pupils are equal, round, and reactive to light.  Neck: Normal range of motion. Neck supple. No thyromegaly present.  Cardiovascular: Normal rate, regular rhythm, normal heart sounds and intact distal pulses.   No murmur heard. Pulmonary/Chest: Effort normal and breath sounds normal.  Abdominal: Soft. Bowel sounds are normal.  Musculoskeletal: Normal range of  motion.  Neurological: She is alert and oriented to person, place, and time.  Skin: Skin is warm and dry.  Psychiatric: She has a normal mood and affect.          Assessment & Plan:  See separate problem-list charting:

## 2013-04-09 NOTE — Assessment & Plan Note (Addendum)
Lab Results  Component Value Date   HGBA1C 9.5 04/09/2013   HGBA1C 8.9 11/26/2012   HGBA1C 9.0 08/26/2012     Assessment: Diabetes control: poor control (HgbA1C >9%) Progress toward A1C goal:  deteriorated Comments: had decreased Novolog from 15 to 12 units and Levemir from 60 to 55, her cbgs are running very high for the most part averaging ~ 300.  She had one hypoglycemic recording at 53.  Her diabetic controlled has worsened since the above changes.    Plan: Medications:  continue current medications Home glucose monitoring: Frequency:   Timing:   Instruction/counseling given: reminded to get eye exam, reminded to bring blood glucose meter & log to each visit and reminded to bring medications to each visit Educational resources provided:   Self management tools provided:   Other plans: increase mealtime Novolog to 14 and increase Levemir to 57 units with caution for hypoglycemia, sugar pills iven to pt by DME today.  -She will need a review of Carb counting as well at next follow-up.  -Pt states that's she has been under stress and not eating on a good schedule secondary to daughter's recent dx of mets breast cancer.

## 2013-04-15 DIAGNOSIS — I872 Venous insufficiency (chronic) (peripheral): Secondary | ICD-10-CM | POA: Diagnosis not present

## 2013-04-15 NOTE — Progress Notes (Signed)
Case discussed with Dr. Schooler at the time of the visit.  We reviewed the resident's history and exam and pertinent patient test results.  I agree with the assessment, diagnosis, and plan of care documented in the resident's note.     

## 2013-04-19 ENCOUNTER — Other Ambulatory Visit: Payer: Self-pay | Admitting: Internal Medicine

## 2013-04-23 ENCOUNTER — Telehealth: Payer: Self-pay | Admitting: Internal Medicine

## 2013-04-23 NOTE — Telephone Encounter (Signed)
PATIENT REASSIGNED TO DR. Juliann Mule. CALENDAR MAILED.

## 2013-04-26 ENCOUNTER — Other Ambulatory Visit: Payer: Self-pay | Admitting: *Deleted

## 2013-04-30 ENCOUNTER — Telehealth: Payer: Self-pay | Admitting: Hematology and Oncology

## 2013-04-30 NOTE — Telephone Encounter (Signed)
s/w pt re new time for 7/17 f/u @ 12:30pm. informed by myrtle on 3/30 that pt was scheduled w/CP1 and should have been see KK. pt appt rerouted to NG as KK is full. other appts remain the same.

## 2013-05-06 ENCOUNTER — Other Ambulatory Visit: Payer: Self-pay

## 2013-05-07 ENCOUNTER — Other Ambulatory Visit: Payer: Self-pay | Admitting: Emergency Medicine

## 2013-05-07 ENCOUNTER — Encounter: Payer: Self-pay | Admitting: *Deleted

## 2013-05-08 ENCOUNTER — Other Ambulatory Visit: Payer: Self-pay | Admitting: Internal Medicine

## 2013-06-07 ENCOUNTER — Other Ambulatory Visit: Payer: Self-pay | Admitting: Internal Medicine

## 2013-06-14 DIAGNOSIS — H25019 Cortical age-related cataract, unspecified eye: Secondary | ICD-10-CM | POA: Diagnosis not present

## 2013-06-14 DIAGNOSIS — H18419 Arcus senilis, unspecified eye: Secondary | ICD-10-CM | POA: Diagnosis not present

## 2013-06-14 DIAGNOSIS — H4011X0 Primary open-angle glaucoma, stage unspecified: Secondary | ICD-10-CM | POA: Diagnosis not present

## 2013-06-14 DIAGNOSIS — H11159 Pinguecula, unspecified eye: Secondary | ICD-10-CM | POA: Diagnosis not present

## 2013-06-17 ENCOUNTER — Ambulatory Visit: Payer: Medicare Other | Admitting: Internal Medicine

## 2013-06-17 ENCOUNTER — Ambulatory Visit (INDEPENDENT_AMBULATORY_CARE_PROVIDER_SITE_OTHER): Payer: Medicare Other | Admitting: Internal Medicine

## 2013-06-17 ENCOUNTER — Encounter: Payer: Self-pay | Admitting: Internal Medicine

## 2013-06-17 VITALS — BP 142/84 | HR 93 | Temp 97.4°F | Wt 165.2 lb

## 2013-06-17 DIAGNOSIS — I10 Essential (primary) hypertension: Secondary | ICD-10-CM | POA: Diagnosis not present

## 2013-06-17 DIAGNOSIS — E1149 Type 2 diabetes mellitus with other diabetic neurological complication: Secondary | ICD-10-CM | POA: Diagnosis not present

## 2013-06-17 DIAGNOSIS — E119 Type 2 diabetes mellitus without complications: Secondary | ICD-10-CM | POA: Diagnosis not present

## 2013-06-17 DIAGNOSIS — E1169 Type 2 diabetes mellitus with other specified complication: Secondary | ICD-10-CM

## 2013-06-17 LAB — GLUCOSE, CAPILLARY
Glucose-Capillary: 49 mg/dL — ABNORMAL LOW (ref 70–99)
Glucose-Capillary: 78 mg/dL (ref 70–99)
Glucose-Capillary: 82 mg/dL (ref 70–99)

## 2013-06-17 MED ORDER — GABAPENTIN 300 MG PO CAPS: 300.0000 mg | ORAL_CAPSULE | Freq: Every day | ORAL | Status: DC

## 2013-06-17 MED ORDER — INSULIN ASPART 100 UNIT/ML FLEXPEN: 12.0000 [IU] | PEN_INJECTOR | Freq: Two times a day (BID) | SUBCUTANEOUS | Status: DC

## 2013-06-17 MED ORDER — INSULIN ASPART 100 UNIT/ML FLEXPEN: 12.0000 [IU] | PEN_INJECTOR | Freq: Three times a day (TID) | SUBCUTANEOUS | Status: DC

## 2013-06-17 MED ORDER — INSULIN DETEMIR 100 UNIT/ML FLEXPEN: 60.0000 [IU] | PEN_INJECTOR | Freq: Every morning | SUBCUTANEOUS | Status: DC

## 2013-06-17 NOTE — Patient Instructions (Signed)
General Instructions: -Regarding your diabetes, please continue to take Detemir 60 units before bedtime, and Novolog 12units before lunch and dinner.  Continue checking your sugars, but also be sure to make an appointment with Debera Lat so she may help with diet suggestions. Continue exercising, that's great!!  -Regarding the pins and needs sensation in your hands, take gabapentin 300mg  before bedtime - if you do not tolerate this medication, let us know  Thank you for bringing your medicines today. This helps [place] keep you safe from mistakes.  Please be sure to bring all of your medications with you to every visit.  Should you have any new or worsening symptoms, please be sure to call the clinic at [phone].   Self Care Goals & Plans:  Self Care Goal 06/17/2013  Manage my medications take my medicines as prescribed; bring my medications to every visit; refill my medications on time  Monitor my health keep track of my blood glucose; bring my glucose meter and log to each visit  Eat healthy foods drink diet soda or water instead of juice or soda; eat baked foods instead of fried foods; eat foods that are low in salt  Be physically active find an activity I enjoy  Meeting treatment goals maintain the current self-care plan    Home Blood Glucose Monitoring 06/17/2013  Check my blood sugar 3 times a day  When to check my blood sugar before meals     Care Management & Community Referrals:  Referral 06/17/2013  Referrals made for care management support diabetes educator  Referrals made to community resources -
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
## 2013-06-17 NOTE — Progress Notes (Signed)
Hypoglycemic Event  CBG: 49 @ 0924  Treatment: 4 glucose tabs and 4oz apple juice  Symptoms: Shaky and Nervous/irritable  Follow-up CBG: Time: 78 CBG Result: 0946  Possible Reasons for Event: inadequate meal intake  Comments/MD notified: Dr Burnard Bunting aware    [name] C Goldston5/21/20159:52 AM    Remember to initiate Hypoglycemia Order Set & complete
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
## 2013-06-17 NOTE — Progress Notes (Signed)
Subjective:   Patient ID: [name] female   DOB: [birth year] 63 y.o.   MRN: [id number]  Chief Complaint  Patient presents with  . Diabetes    follow up     HPI: [name] is a 63 y.o. woman with h/o DM, hypothyroidism, HLD, CKD stage 3, Depression, breast cancer and glaucoma who presents for DM.    For DM, she takes levemir 60u qHS and novolog 12 TIDWC, as well as metformin. Review of glucometer - Avg glu 177, within target 26% of the time.  1 episode of hypoglycemia - feels like she feels now - very shakey/nervous.  Took 12u novolog this morning, had grits and tomatoes for breakfast, no meat.   Usually she skips breakfast, takes her daughter to work, hard time sleeping at night.  Eats lunch and dinner.    CBG 49 right now!  Lunch: salad, sandwhich Dinner: greens, beans, pork chops Snacks: PB If desert - angel food cake, not often  Exercise: walks about three times week - around the neighborhood, sometimes to mall  Pins and needle sensation in fingers in toes for quite some time - to the point where it is difficult to write (worse in hands).   Review of Systems: Constitutional: Denies fever, chills, diaphoresis, appetite change  HEENT: Denies photophobia, eye pain, redness, hearing loss, ear pain, congestion, sore throat, rhinorrhea, sneezing, mouth sores, trouble swallowing, neck pain, neck stiffness and tinnitus.  Respiratory: Denies SOB, cough, chest tightness, and wheezing. +DOE going up hill Cardiovascular: Denies chest pain, palpitations and leg swelling.  Gastrointestinal: Denies nausea, vomiting, abdominal pain, diarrhea, constipation,blood in stool and abdominal distention.  Genitourinary: Denies dysuria, urgency, frequency, hematuria, flank pain and difficulty urinating.  Musculoskeletal: Denies myalgias, joint swelling, and gait problem. Chronic back pain - spinal stenosis Skin: Denies pallor, rash and wound.  Neurological: Denies dizziness, seizures,  syncope, weakness, lightheadedness, and headaches.  Hematological: no obvious bleeding   Past Medical History  Diagnosis Date  . Allergy   . Anemia   . Depression   . Arthritis   . Cancer   . Cataract   . Diabetes mellitus without complication   . Glaucoma   . Hyperlipidemia   . Hypertension   . Chronic kidney disease   . Neuromuscular disorder   . Thyroid disease   . Left arm pain 07/28/2012  . BREAST CANCER, HX OF 04/16/2006    Qualifier: Diagnosis of  By: Leward Quan MD, Pamala Hurry    . Diverticulosis    Current Outpatient Prescriptions  Medication Sig Dispense Refill  . acetaminophen (TYLENOL) 325 MG tablet Take 1 tablet (325 mg total) by mouth every 8 (eight) hours as needed.  100 tablet  2  . acetaminophen (TYLENOL) 500 MG tablet Take 1 tablet (500 mg total) by mouth every 6 (six) hours as needed.  50 tablet  3  . cycloSPORINE (RESTASIS) 0.05 % ophthalmic emulsion Place 1 drop into both eyes 2 (two) times daily.      [name] glucose 4 GM chewable tablet Chew 4 tablets (16 g total) by mouth as needed for low blood sugar.  50 tablet  12  . insulin aspart (NOVOLOG FLEXPEN) 100 UNIT/ML FlexPen Inject 14 Units into the skin 3 (three) times daily with meals.  18 mL  3  . Insulin Detemir (LEVEMIR) 100 UNIT/ML Pen Inject 57 Units into the skin every morning.  15 mL  3  . Insulin Syringe-Needle U-100 (B-D INS SYR ULTRAFINE .5CC/30G) 30G X 1/2" 0.5 ML  MISC To use 4 times daily for injecting insulin. diag code 250.42. Insulin dependent  150 each  6  . ketorolac (ACULAR) 0.4 % SOLN Place 1 drop into the left eye 3 (three) times daily.      [name] levothyroxine (SYNTHROID, LEVOTHROID) 88 MCG tablet TAKE 1 TABLET BY MOUTH DAILY  30 tablet  2  . meloxicam (MOBIC) 15 MG tablet Take 1 tablet (15 mg total) by mouth daily as needed for pain.  30 tablet  1  . metFORMIN (GLUCOPHAGE) 1000 MG tablet TAKE 1 TABLET BY MOUTH TWICE DAILY WITH A MEAL  60 tablet  1  . olopatadine (PATANOL) 0.1 % ophthalmic solution 1  drop 2 (two) times daily as needed.      . pravastatin (PRAVACHOL) 40 MG tablet TAKE 1 TABLET BY MOUTH EVERY DAY  30 tablet  3  . Travoprost, BAK Free, (TRAVATAN) 0.004 % SOLN ophthalmic solution 1 drop at bedtime.      . triamterene-hydrochlorothiazide (MAXZIDE-25) 37.5-25 MG per tablet TAKE 1/2 TABLET BY MOUTH EVERY DAY  45 tablet  3  . venlafaxine XR (EFFEXOR-XR) 75 MG 24 hr capsule TAKE 1 CAPSULE BY MOUTH DAILY  30 capsule  0   No current facility-administered medications for this visit.   Family History  Problem Relation Age of Onset  . Heart disease Father   . Diabetes Father   . Stroke Sister   . Anuerysm Sister   . Heart disease Brother   . Anuerysm Brother    History   Social History  . Marital Status: Divorced    Spouse Name: N/A    Number of Children: N/A  . Years of Education: N/A   Social History Main Topics  . Smoking status: Never Smoker   . Smokeless tobacco: None  . Alcohol Use: No  . Drug Use: No  . Sexual Activity: None   Other Topics Concern  . None   Social History Narrative  . None    Objective:  Physical Exam: Filed Vitals:   06/17/13 0853 06/17/13 0911  BP: 180/86 142/84  Pulse: 98 93  Temp: 97.4 F (36.3 C)   TempSrc: Oral   Weight: 165 lb 3.2 oz (74.934 kg)   SpO2: 100%    HEENT: PERRL, EOMI, no scleral icterus Cardiac: RRR, no rubs, murmurs or gallops Pulm: clear to auscultation bilaterally, moving normal volumes of air Abd: soft, nontender, nondistended, BS present Ext: warm and well perfused, no pedal edema Neuro: alert and oriented X3, cranial nerves II-XII grossly intact  Assessment & Plan:  Case and care discussed with Dr. Lynnae January.  CBG 82 prior to leaving clinic.  Please see problem oriented charting for further details. Patient to return in 1 month for DM follow up - sooner with [name].

## 2013-06-17 NOTE — Progress Notes (Signed)
Case discussed with Dr. Sharda soon after the resident saw the patient.  We reviewed the resident's history and exam and pertinent patient test results.  I agree with the assessment, diagnosis, and plan of care documented in the resident's note. 

## 2013-06-22 ENCOUNTER — Other Ambulatory Visit (INDEPENDENT_AMBULATORY_CARE_PROVIDER_SITE_OTHER): Payer: Medicare Other

## 2013-06-22 DIAGNOSIS — I10 Essential (primary) hypertension: Secondary | ICD-10-CM | POA: Diagnosis not present

## 2013-06-22 LAB — BASIC METABOLIC PANEL
BUN: 16 mg/dL (ref 6–23)
CO2: 26 mEq/L (ref 19–32)
Calcium: 9.9 mg/dL (ref 8.4–10.5)
Chloride: 104 mEq/L (ref 96–112)
Creat: 1.01 mg/dL (ref 0.50–1.10)
Glucose, Bld: 123 mg/dL — ABNORMAL HIGH (ref 70–99)
Potassium: 3.8 mEq/L (ref 3.5–5.3)
Sodium: 142 mEq/L (ref 135–145)

## 2013-06-24 ENCOUNTER — Ambulatory Visit: Payer: Medicare Other | Admitting: Internal Medicine

## 2013-07-05 ENCOUNTER — Ambulatory Visit (INDEPENDENT_AMBULATORY_CARE_PROVIDER_SITE_OTHER): Payer: Medicare Other | Admitting: Ophthalmology

## 2013-07-05 DIAGNOSIS — H2510 Age-related nuclear cataract, unspecified eye: Secondary | ICD-10-CM

## 2013-07-05 DIAGNOSIS — H43819 Vitreous degeneration, unspecified eye: Secondary | ICD-10-CM

## 2013-07-05 DIAGNOSIS — E1165 Type 2 diabetes mellitus with hyperglycemia: Secondary | ICD-10-CM | POA: Diagnosis not present

## 2013-07-05 DIAGNOSIS — E11319 Type 2 diabetes mellitus with unspecified diabetic retinopathy without macular edema: Secondary | ICD-10-CM | POA: Diagnosis not present

## 2013-07-05 DIAGNOSIS — H35039 Hypertensive retinopathy, unspecified eye: Secondary | ICD-10-CM

## 2013-07-05 DIAGNOSIS — E1139 Type 2 diabetes mellitus with other diabetic ophthalmic complication: Secondary | ICD-10-CM | POA: Diagnosis not present

## 2013-07-05 DIAGNOSIS — I10 Essential (primary) hypertension: Secondary | ICD-10-CM

## 2013-07-06 ENCOUNTER — Other Ambulatory Visit: Payer: Self-pay | Admitting: Internal Medicine

## 2013-07-06 ENCOUNTER — Other Ambulatory Visit: Payer: Self-pay | Admitting: Emergency Medicine

## 2013-07-09 ENCOUNTER — Other Ambulatory Visit: Payer: Self-pay | Admitting: *Deleted

## 2013-07-09 MED ORDER — VENLAFAXINE HCL ER 75 MG PO CP24: ORAL_CAPSULE | ORAL | Status: DC

## 2013-07-19 ENCOUNTER — Encounter: Payer: Medicare Other | Admitting: Dietician

## 2013-07-19 ENCOUNTER — Ambulatory Visit: Payer: Medicare Other | Admitting: Internal Medicine

## 2013-07-20 ENCOUNTER — Encounter: Payer: Self-pay | Admitting: Internal Medicine

## 2013-07-20 ENCOUNTER — Ambulatory Visit (INDEPENDENT_AMBULATORY_CARE_PROVIDER_SITE_OTHER): Payer: Medicare Other | Admitting: Dietician

## 2013-07-20 ENCOUNTER — Other Ambulatory Visit: Payer: Self-pay | Admitting: Internal Medicine

## 2013-07-20 ENCOUNTER — Ambulatory Visit (INDEPENDENT_AMBULATORY_CARE_PROVIDER_SITE_OTHER): Payer: Medicare Other | Admitting: Internal Medicine

## 2013-07-20 VITALS — BP 109/65 | HR 94 | Temp 97.6°F | Wt 158.8 lb

## 2013-07-20 DIAGNOSIS — E1149 Type 2 diabetes mellitus with other diabetic neurological complication: Secondary | ICD-10-CM

## 2013-07-20 DIAGNOSIS — R739 Hyperglycemia, unspecified: Secondary | ICD-10-CM

## 2013-07-20 DIAGNOSIS — I10 Essential (primary) hypertension: Secondary | ICD-10-CM | POA: Diagnosis not present

## 2013-07-20 DIAGNOSIS — E119 Type 2 diabetes mellitus without complications: Secondary | ICD-10-CM | POA: Diagnosis not present

## 2013-07-20 LAB — POCT GLYCOSYLATED HEMOGLOBIN (HGB A1C): Hemoglobin A1C: 11.1

## 2013-07-20 LAB — GLUCOSE, CAPILLARY
Glucose-Capillary: 504 mg/dL — ABNORMAL HIGH (ref 70–99)
Glucose-Capillary: 271 mg/dL — ABNORMAL HIGH (ref 70–99)

## 2013-07-20 MED ORDER — INSULIN ASPART 100 UNIT/ML ~~LOC~~ SOLN
12.0000 [IU] | Freq: Once | SUBCUTANEOUS | Status: AC
Start: 2013-07-20 — End: 2013-07-20
  Administered 2013-07-20: 12 [IU] via SUBCUTANEOUS

## 2013-07-20 NOTE — Progress Notes (Signed)
Medical Nutrition Therapy:  Appt start time: 1508 end time:  1600. Last visit 07/2012 Assessment:  Primary concerns today: Blood sugar control.  Patient is here today with daughter who is supportive of diabetes care and healthier eating.  Goal is improved blood sugars to decrease her risk of further renal impairment. Lost meter- new one provided today(One Touch Ultra 2 per her request).  Her blood sugar is high today likely due to not having a meter and stopped taking Novolog while not abel to self monitor her blood sugar. Reports variable blood sugars at home when she did have a meter. She reports symptoms of hypoglycemia on occasion at no specified time.   Progress Towards Goal(s):  Minimal progress.   Nutritional Diagnosis:  [place]-2.2 Altered nutrition-related laboratory values As related to elevated glucose. As evidenced by high hemoglobin A1C.   Intervention: Reviewed carb counting with patient to assess understanding of which foods affect blood sugar and encouraged her to try to stay around 3 servings (45 grams) with each meal and 15 grams or less for snacks to help blood sugars be more consistent. Patient knew about 1/3 of carb counts and was able to identify ~ 75% of foods that affect her blood sugar.   Monitoring/Evaluation: Dietary intake, blood sugars, insulin timing and amounts and weight in  4 weeks.

## 2013-07-20 NOTE — Assessment & Plan Note (Signed)
Lab Results  Component Value Date   HGBA1C 9.5 04/09/2013   HGBA1C 8.9 11/26/2012   HGBA1C 9.0 08/26/2012     Assessment: Diabetes control:  poor  Progress toward A1C goal:   about the same Comments: pt has hx of poor compliance and now lost meter and was not understanding medication management   Plan: Medications:  continue current medications of metformin 1000mg  BID, detemir 60 units QHS, and novolog 12 units with meals  Home glucose monitoring: Frequency:   Timing:   Instruction/counseling given: reminded to get eye exam, reminded to bring blood glucose meter & log to each visit, reminded to bring medications to each visit, discussed the need for weight loss and discussed diet Educational resources provided:   Self management tools provided:   Other plans: pt to see DM educator after this visit, intense time spent on education regarding importance of preventing hypoglycemia, follow up in 1 month for meter readings and titration

## 2013-07-20 NOTE — Assessment & Plan Note (Signed)
CBG (last 3)   Recent Labs  07/20/13 1422  GLUCAP 504*    Pt presented with asymptomatic hyperglycemia in the setting of not having her meter and therefore the patient stopped taking her novolog.  -Pt was given novolog 12 units during visit and was following up with DM educator to replace meter -will follow up in 1 month

## 2013-07-20 NOTE — Assessment & Plan Note (Signed)
BP Readings from Last 3 Encounters:  07/20/13 109/65  06/17/13 142/84  04/09/13 155/87    Lab Results  Component Value Date   NA 142 06/22/2013   K 3.8 06/22/2013   CREATININE 1.01 06/22/2013    Assessment: Blood pressure control:  excellent Progress toward BP goal:   well controlled Comments: pt adhereing to medication   Plan: Medications:  continue current medications of triam-hctz 37.5-25 Educational resources provided:   Self management tools provided:   Other plans: labs up to date and will continue current management

## 2013-07-20 NOTE — Patient Instructions (Signed)
General Instructions: Important to bring in your meter the next time we see you in 1 month to make sure your sugars are doing better.   Please try to bring all your medicines next time. This will help [place] keep you safe from mistakes.   Progress Toward Treatment Goals:  Treatment Goal 06/17/2013  Hemoglobin A1C unable to assess  Blood pressure deteriorated    Self Care Goals & Plans:  Self Care Goal 06/17/2013  Manage my medications take my medicines as prescribed; bring my medications to every visit; refill my medications on time  Monitor my health keep track of my blood glucose; bring my glucose meter and log to each visit  Eat healthy foods drink diet soda or water instead of juice or soda; eat baked foods instead of fried foods; eat foods that are low in salt  Be physically active find an activity I enjoy  Meeting treatment goals maintain the current self-care plan    Home Blood Glucose Monitoring 06/17/2013  Check my blood sugar 3 times a day  When to check my blood sugar before meals     Care Management & Community Referrals:  Referral 06/17/2013  Referrals made for care management support diabetes educator  Referrals made to community resources -

## 2013-07-20 NOTE — Progress Notes (Signed)
Subjective:    Patient ID: Angus Palms, female    DOB: 10/21/1950, 63 y.o.   MRN: [id number]  HPI Ms. Elbert Ewings is a 63 yo woman pmh as listed below presents for HTN and DM follow up.   DM - Patient checking blood sugars 2 times daily, before breakfast and dinner but recently lost her meter 4 days ago when she also then stopped taking her novolog. Reports most recent blood sugar that she remembers of 150 mg/dL. Currently taking her metformin and detemir 60 units qhs. She recognizes her symptoms and had 1 hypoglycemic episodes since last visit. denies polyuria, polydipsia, nausea, vomiting, diarrhea.  does not request refills today.  In terms of her BP she has been taking her medication and denied any CP, SOB, LE edema, DOE, HA, or blurry vision.   Past Medical History  Diagnosis Date  . Allergy   . Anemia   . Depression   . Arthritis   . Cancer   . Cataract   . Diabetes mellitus without complication   . Glaucoma   . Hyperlipidemia   . Hypertension   . Chronic kidney disease   . Neuromuscular disorder   . Thyroid disease   . Left arm pain 07/28/2012  . BREAST CANCER, HX OF 04/16/2006    Qualifier: Diagnosis of  By: Leward Quan MD, Pamala Hurry    . Diverticulosis    Current Outpatient Prescriptions on File Prior to Visit  Medication Sig Dispense Refill  . acetaminophen (TYLENOL) 500 MG tablet Take 1 tablet (500 mg total) by mouth every 6 (six) hours as needed.  50 tablet  3  . aspirin 81 MG tablet Take 81 mg by mouth daily.      . B-D ULTRAFINE III SHORT PEN 31G X 8 MM MISC       . Cholecalciferol (VITAMIN D-3) 1000 UNITS CAPS Take 1,000 Units by mouth.      . cycloSPORINE (RESTASIS) 0.05 % ophthalmic emulsion Place 1 drop into both eyes 2 (two) times daily.      [name] gabapentin (NEURONTIN) 300 MG capsule Take 1 capsule (300 mg total) by mouth at bedtime.  30 capsule  0  . glucose 4 GM chewable tablet Chew 4 tablets (16 g total) by mouth as needed for low blood sugar.  50 tablet  12  .  insulin aspart (NOVOLOG FLEXPEN) 100 UNIT/ML FlexPen Inject 12 Units into the skin 2 (two) times daily before lunch and supper.  18 mL  3  . Insulin Detemir (LEVEMIR) 100 UNIT/ML Pen Inject 60 Units into the skin every morning.  15 mL  3  . Insulin Syringe-Needle U-100 (B-D INS SYR ULTRAFINE .5CC/30G) 30G X 1/2" 0.5 ML MISC To use 4 times daily for injecting insulin. diag code 250.42. Insulin dependent  150 each  6  . ketorolac (ACULAR) 0.4 % SOLN Place 1 drop into the left eye 3 (three) times daily.      [name] levothyroxine (SYNTHROID, LEVOTHROID) 88 MCG tablet TAKE 1 TABLET BY MOUTH DAILY  30 tablet  2  . meloxicam (MOBIC) 15 MG tablet TAKE 1 TABLET BY MOUTH DAILY AS NEEDED FOR PAIN  30 tablet  0  . metFORMIN (GLUCOPHAGE) 1000 MG tablet TAKE 1 TABLET BY MOUTH TWICE DAILY WITH A MEAL  60 tablet  0  . olopatadine (PATANOL) 0.1 % ophthalmic solution 1 drop 2 (two) times daily as needed.      . pravastatin (PRAVACHOL) 40 MG tablet TAKE 1 TABLET BY MOUTH EVERY  DAY  30 tablet  3  . Travoprost, BAK Free, (TRAVATAN) 0.004 % SOLN ophthalmic solution 1 drop at bedtime.      . triamterene-hydrochlorothiazide (MAXZIDE-25) 37.5-25 MG per tablet TAKE 1/2 TABLET BY MOUTH EVERY DAY  45 tablet  3  . venlafaxine XR (EFFEXOR-XR) 75 MG 24 hr capsule TAKE 1 CAPSULE BY MOUTH DAILY  30 capsule  0   No current facility-administered medications on file prior to visit.   Social, surgical, family history reviewed with patient and updated in appropriate chart locations.   Review of Systems Negative except as listed in HPI     Objective:   Physical Exam Filed Vitals:   07/20/13 1356  BP: 109/65  Pulse: 94  Temp: 97.6 F (36.4 C)   General: sitting in chair, NAD HEENT: PERRL, EOMI, no scleral icterus Cardiac: RRR, no rubs, murmurs or gallops Pulm: clear to auscultation bilaterally, moving normal volumes of air Abd: soft, nontender, nondistended, BS present Ext: warm and well perfused, no pedal edema Neuro: alert  and oriented X3, cranial nerves II-XII grossly intact    Assessment & Plan:  Please see problem oriented charting  Pt discussed with Dr. Dareen Piano

## 2013-07-21 ENCOUNTER — Encounter: Payer: Self-pay | Admitting: Dietician

## 2013-07-21 LAB — LIPID PANEL
Cholesterol: 173 mg/dL (ref 0–200)
HDL: 42 mg/dL (ref >39)
LDL Cholesterol: 111 mg/dL — ABNORMAL HIGH (ref 0–99)
Total CHOL/HDL Ratio: 4.1 Ratio
Triglycerides: 101 mg/dL (ref <150)
VLDL: 20 mg/dL (ref 0–40)

## 2013-07-21 NOTE — Progress Notes (Signed)
INTERNAL MEDICINE TEACHING ATTENDING ADDENDUM - Annaliyah Willig, MD: I reviewed and discussed at the time of visit with the resident Dr. Sadek, the patient's medical history, physical examination, diagnosis and results of tests and treatment and I agree with the patient's care as documented. 

## 2013-08-01 ENCOUNTER — Other Ambulatory Visit: Payer: Self-pay | Admitting: Internal Medicine

## 2013-08-09 ENCOUNTER — Ambulatory Visit
Admission: RE | Admit: 2013-08-09 | Discharge: 2013-08-09 | Disposition: A | Discharge status: Home / Self Care | Payer: Medicare Other | Source: Ambulatory Visit | Attending: Oncology | Admitting: Oncology

## 2013-08-09 ENCOUNTER — Other Ambulatory Visit (HOSPITAL_BASED_OUTPATIENT_CLINIC_OR_DEPARTMENT_OTHER): Payer: Medicare Other

## 2013-08-09 ENCOUNTER — Other Ambulatory Visit: Payer: Self-pay | Admitting: Oncology

## 2013-08-09 DIAGNOSIS — Z853 Personal history of malignant neoplasm of breast: Secondary | ICD-10-CM

## 2013-08-09 DIAGNOSIS — Z9011 Acquired absence of right breast and nipple: Secondary | ICD-10-CM

## 2013-08-09 DIAGNOSIS — Z1231 Encounter for screening mammogram for malignant neoplasm of breast: Secondary | ICD-10-CM

## 2013-08-09 LAB — CBC WITH DIFFERENTIAL/PLATELET
BASO%: 0.8 % (ref 0.0–2.0)
Basophils Absolute: 0.1 10*3/uL (ref 0.0–0.1)
EOS%: 2.6 % (ref 0.0–7.0)
Eosinophils Absolute: 0.2 10*3/uL (ref 0.0–0.5)
HCT: 35.8 % (ref 34.8–46.6)
HGB: 11.9 g/dL (ref 11.6–15.9)
LYMPH%: 29.2 % (ref 14.0–49.7)
MCH: 30.3 pg (ref 25.1–34.0)
MCHC: 33.1 g/dL (ref 31.5–36.0)
MCV: 91.3 fL (ref 79.5–101.0)
MONO#: 0.4 10*3/uL (ref 0.1–0.9)
MONO%: 4.9 % (ref 0.0–14.0)
NEUT#: 5.3 10*3/uL (ref 1.5–6.5)
NEUT%: 62.5 % (ref 38.4–76.8)
Platelets: 226 10*3/uL (ref 145–400)
RBC: 3.93 10*6/uL (ref 3.70–5.45)
RDW: 13 % (ref 11.2–14.5)
WBC: 8.5 10*3/uL (ref 3.9–10.3)
lymph#: 2.5 10*3/uL (ref 0.9–3.3)

## 2013-08-09 LAB — COMPREHENSIVE METABOLIC PANEL (CC13)
ALT: 12 U/L (ref 0–55)
AST: 16 U/L (ref 5–34)
Albumin: 3.9 g/dL (ref 3.5–5.0)
Alkaline Phosphatase: 71 U/L (ref 40–150)
Anion Gap: 12 mEq/L — ABNORMAL HIGH (ref 3–11)
BUN: 27.3 mg/dL — ABNORMAL HIGH (ref 7.0–26.0)
CO2: 25 mEq/L (ref 22–29)
Calcium: 10.3 mg/dL (ref 8.4–10.4)
Chloride: 106 mEq/L (ref 98–109)
Creatinine: 1.4 mg/dL — ABNORMAL HIGH (ref 0.6–1.1)
Glucose: 192 mg/dl — ABNORMAL HIGH (ref 70–140)
Potassium: 3.4 mEq/L — ABNORMAL LOW (ref 3.5–5.1)
Sodium: 143 mEq/L (ref 136–145)
Total Bilirubin: <0.2 mg/dL (ref 0.20–1.20)
Total Protein: 7.4 g/dL (ref 6.4–8.3)

## 2013-08-09 LAB — URIC ACID (CC13): Uric Acid, Serum: 7.3 mg/dl (ref 2.6–7.4)

## 2013-08-13 ENCOUNTER — Encounter: Payer: Self-pay | Admitting: Hematology and Oncology

## 2013-08-13 ENCOUNTER — Telehealth: Payer: Self-pay | Admitting: Hematology and Oncology

## 2013-08-13 ENCOUNTER — Ambulatory Visit (HOSPITAL_BASED_OUTPATIENT_CLINIC_OR_DEPARTMENT_OTHER): Payer: Medicare Other | Admitting: Hematology and Oncology

## 2013-08-13 ENCOUNTER — Ambulatory Visit: Payer: Medicare Other | Admitting: Oncology

## 2013-08-13 VITALS — BP 142/66 | HR 87 | Temp 98.4°F | Resp 18 | Ht 63.0 in | Wt 162.0 lb

## 2013-08-13 DIAGNOSIS — E1142 Type 2 diabetes mellitus with diabetic polyneuropathy: Secondary | ICD-10-CM

## 2013-08-13 DIAGNOSIS — Z853 Personal history of malignant neoplasm of breast: Secondary | ICD-10-CM | POA: Diagnosis not present

## 2013-08-13 DIAGNOSIS — E1149 Type 2 diabetes mellitus with other diabetic neurological complication: Secondary | ICD-10-CM | POA: Diagnosis not present

## 2013-08-13 DIAGNOSIS — E1140 Type 2 diabetes mellitus with diabetic neuropathy, unspecified: Secondary | ICD-10-CM

## 2013-08-13 NOTE — Telephone Encounter (Signed)
Pt confirmed ov per 07/17 POF, gave pt AVS...[name] KitchenMarland KitchenKJ

## 2013-08-13 NOTE — Progress Notes (Signed)
[place] FOLLOW-UP progress notes  Patient Care Team: Aldine Contes, MD as PCP - General (Internal Medicine) Hayden Pedro, MD as Attending Physician (Ophthalmology)  CHIEF COMPLAINTS/PURPOSE OF VISIT:  History of right breast cancer  HISTORY OF PRESENTING ILLNESS:  [name] 63 y.o. female was transferred to my care after her prior physician has left.  I reviewed the patient's records extensive and collaborated the history with the patient. Summary of her history is as follows: This patient was diagnosed with breast cancer of the incidental finding of a lump on her chest when she presented to her doctor with coughing. I do not have all the available records. She was initially diagnosed with a Stage III B lymph node positive cancer of the right breast in April of 1999, treated with induction chemotherapy followed by a right mastectomy then radiation and additional chemotherapy. She achieved a durable response with no evidence for recurrence. Due to her young age, she had genetic counselor and was tested negative for BRCA mutation. She never received adjuvant tamoxifen because she recalled that he was issued a receptor negative. The patient has 4 children and her first child was born at age of 23. She never breast-feed any of her children. She did not recall taking hormone replacement or birth control pill. She had a partial hysterectomy at a young age due to heavy menstruation.  She denies any recent abnormal breast examination, palpable mass, abnormal breast appearance or nipple changes   MEDICAL HISTORY:  Past Medical History  Diagnosis Date  . Allergy   . Anemia   . Depression   . Arthritis   . Cancer   . Cataract   . Diabetes mellitus without complication   . Glaucoma   . Hyperlipidemia   . Hypertension   . Chronic kidney disease   . Neuromuscular disorder   . Thyroid disease   . Left arm pain 07/28/2012  . BREAST CANCER, HX OF 04/16/2006   Qualifier: Diagnosis of  By: Leward Quan MD, Pamala Hurry    . Diverticulosis     SURGICAL HISTORY: Past Surgical History  Procedure Laterality Date  . Abdominal hysterectomy    . Breast surgery    . Eye surgery    . Tubal ligation      SOCIAL HISTORY: History   Social History  . Marital Status: Divorced    Spouse Name: N/A    Number of Children: N/A  . Years of Education: N/A   Occupational History  . Not on file.   Social History Main Topics  . Smoking status: Never Smoker   . Smokeless tobacco: Never Used  . Alcohol Use: No  . Drug Use: No  . Sexual Activity: Not on file   Other Topics Concern  . Not on file   Social History Narrative  . No narrative on file    FAMILY HISTORY: Family History  Problem Relation Age of Onset  . Heart disease Father   . Diabetes Father   . Stroke Sister   . Anuerysm Sister   . Heart disease Brother   . Anuerysm Brother   . Cancer Daughter     breast ca    ALLERGIES:  is allergic to gabapentin; penicillins; and sulfonamide derivatives.  MEDICATIONS:  Current Outpatient Prescriptions  Medication Sig Dispense Refill  . acetaminophen (TYLENOL) 500 MG tablet Take 1 tablet (500 mg total) by mouth every 6 (six) hours as needed.  50 tablet  3  . aspirin 81 MG tablet Take  81 mg by mouth daily.      . B-D ULTRAFINE III SHORT PEN 31G X 8 MM MISC       . Cholecalciferol (VITAMIN D-3) 1000 UNITS CAPS Take 1,000 Units by mouth.      . clobetasol cream (TEMOVATE) 0.05 %       . cycloSPORINE (RESTASIS) 0.05 % ophthalmic emulsion Place 1 drop into both eyes 2 (two) times daily.      [name] glucose 4 GM chewable tablet Chew 4 tablets (16 g total) by mouth as needed for low blood sugar.  50 tablet  12  . insulin aspart (NOVOLOG FLEXPEN) 100 UNIT/ML FlexPen Inject 12 Units into the skin 2 (two) times daily before lunch and supper.  18 mL  3  . Insulin Detemir (LEVEMIR) 100 UNIT/ML Pen Inject 60 Units into the skin every morning.  15 mL  3  . Insulin  Syringe-Needle U-100 (B-D INS SYR ULTRAFINE .5CC/30G) 30G X 1/2" 0.5 ML MISC To use 4 times daily for injecting insulin. diag code 250.42. Insulin dependent  150 each  6  . ketorolac (ACULAR) 0.4 % SOLN Place 1 drop into the left eye 3 (three) times daily.      [name] levothyroxine (SYNTHROID, LEVOTHROID) 88 MCG tablet TAKE 1 TABLET BY MOUTH DAILY  30 tablet  0  . meloxicam (MOBIC) 15 MG tablet TAKE 1 TABLET BY MOUTH DAILY AS NEEDED FOR PAIN  30 tablet  0  . metFORMIN (GLUCOPHAGE) 1000 MG tablet TAKE 1 TABLET BY MOUTH TWICE DAILY WITH A MEAL  60 tablet  0  . olopatadine (PATANOL) 0.1 % ophthalmic solution 1 drop 2 (two) times daily as needed.      . pravastatin (PRAVACHOL) 40 MG tablet TAKE 1 TABLET BY MOUTH EVERY DAY  30 tablet  3  . Travoprost, BAK Free, (TRAVATAN) 0.004 % SOLN ophthalmic solution 1 drop at bedtime.      . triamterene-hydrochlorothiazide (MAXZIDE-25) 37.5-25 MG per tablet TAKE 1/2 TABLET BY MOUTH EVERY DAY  45 tablet  3  . venlafaxine XR (EFFEXOR-XR) 75 MG 24 hr capsule TAKE 1 CAPSULE BY MOUTH DAILY  30 capsule  0   No current facility-administered medications for this visit.    REVIEW OF SYSTEMS:   Constitutional: Denies fevers, chills or abnormal night sweats Eyes: Denies blurriness of vision, double vision or watery eyes Ears, nose, mouth, throat, and face: Denies mucositis or sore throat Respiratory: Denies cough, dyspnea or wheezes Cardiovascular: Denies palpitation, chest discomfort or lower extremity swelling Gastrointestinal:  Denies nausea, heartburn or change in bowel habits Skin: Denies abnormal skin rashes Lymphatics: Denies new lymphadenopathy or easy bruising Neurological:Denies numbness, tingling or new weaknesses Behavioral/Psych: Mood is stable, no new changes  All other systems were reviewed with the patient and are negative.  PHYSICAL EXAMINATION: ECOG PERFORMANCE STATUS: 0 - Asymptomatic  Filed Vitals:   08/13/13 1231  BP: 142/66  Pulse: 87  Temp:  98.4 F (36.9 C)  Resp: 18   Filed Weights   08/13/13 1231  Weight: 162 lb (73.483 kg)    GENERAL:alert, no distress and comfortable SKIN: skin color, texture, turgor are normal, no rashes or significant lesions EYES: normal, conjunctiva are pink and non-injected, sclera clear OROPHARYNX:no exudate, normal lips, buccal mucosa, and tongue  NECK: supple, thyroid normal size, non-tender, without nodularity LYMPH:  no palpable lymphadenopathy in the cervical, axillary or inguinal LUNGS: clear to auscultation and percussion with normal breathing effort HEART: regular rate & rhythm and no murmurs  without lower extremity edema ABDOMEN:abdomen soft, non-tender and normal bowel sounds Musculoskeletal:no cyanosis of digits and no clubbing  PSYCH: alert & oriented x 3 with fluent speech NEURO: no focal motor/sensory deficits Well-healed mastectomy scar on the right chest wall with no abnormalities. Normal breast exam on the left. LABORATORY DATA:  I have reviewed the data as listed Lab Results  Component Value Date   WBC 8.5 08/09/2013   HGB 11.9 08/09/2013   HCT 35.8 08/09/2013   MCV 91.3 08/09/2013   PLT 226 08/09/2013    Recent Labs  06/22/13 1531 08/09/13 0934  NA 142 143  K 3.8 3.4*  CL 104  --   CO2 26 25  GLUCOSE 123* 192*  BUN 16 27.3*  CREATININE 1.01 1.4*  CALCIUM 9.9 10.3  PROT  --  7.4  ALBUMIN  --  3.9  AST  --  16  ALT  --  12  ALKPHOS  --  71  BILITOT  --  <0.20    RADIOGRAPHIC STUDIES: I have personally reviewed the radiological images as listed and agreed with the findings in the report. Mm Screening Breast Tomo Uni L  08/09/2013   CLINICAL DATA:  Screening.  EXAM: DIGITAL SCREENING UNILATERAL LEFT MAMMOGRAM WITH TOMO AND CAD  COMPARISON:  Previous exam(s).  ACR Breast Density Category b: There are scattered areas of fibroglandular density.  FINDINGS: There are no findings suspicious for malignancy. Images were processed with CAD.  IMPRESSION: No mammographic  evidence of malignancy. A result letter of this screening mammogram will be mailed directly to the patient.  RECOMMENDATION: Screening mammogram in one year. (Code:SM-B-01Y)  BI-RADS CATEGORY  1: Negative.   Electronically Signed   By: Curlene Dolphin M.D.   On: 08/09/2013 13:01    ASSESSMENT & PLAN:  BREAST CANCER, HX OF She was diagnosed more than 15 years ago. Her records were not available, but from what the patient recall, she had a estrogen/progesterone receptor negative breast cancer. Clinically, she has no evidence of recurrence. The patient wants long-term followup at the [facility]. I will see her on a yearly basis with history, physical examination and yearly mammogram without further blood work. When she was diagnosed, she was very young. She recalls seeing a Dietitian and tested negative for hereditary breast/ovary cancer mutations.  Diabetic neuropathy, type II diabetes mellitus This is a long-term manifestation from diabetes and not due to prior chemotherapy. The patient is managed by her primary care provider.    No orders of the defined types were placed in this encounter.    All questions were answered. The patient knows to call the clinic with any problems, questions or concerns. I spent 15 minutes counseling the patient face to face. The total time spent in the appointment was 20 minutes and more than 50% was on counseling.     [facility], Ferron Ishmael, MD 08/13/2013 1:03 PM

## 2013-08-13 NOTE — Assessment & Plan Note (Signed)
She was diagnosed more than 15 years ago. Her records were not available, but from what the patient recall, she had a estrogen/progesterone receptor negative breast cancer. Clinically, she has no evidence of recurrence. The patient wants long-term followup at the cancer Center. I will see her on a yearly basis with history, physical examination and yearly mammogram without further blood work. When she was diagnosed, she was very young. She recalls seeing a genetic counselor and tested negative for hereditary breast/ovary cancer mutations. 

## 2013-08-13 NOTE — Assessment & Plan Note (Signed)
This is a long-term manifestation from diabetes and not due to prior chemotherapy. The patient is managed by her primary care provider.

## 2013-08-16 ENCOUNTER — Other Ambulatory Visit: Payer: Self-pay | Admitting: Internal Medicine

## 2013-08-16 ENCOUNTER — Other Ambulatory Visit: Payer: Self-pay | Admitting: Emergency Medicine

## 2013-08-19 ENCOUNTER — Other Ambulatory Visit: Payer: Self-pay | Admitting: Oncology

## 2013-08-19 ENCOUNTER — Encounter: Payer: Self-pay | Admitting: Dietician

## 2013-08-19 ENCOUNTER — Ambulatory Visit (INDEPENDENT_AMBULATORY_CARE_PROVIDER_SITE_OTHER): Payer: Medicare Other | Admitting: Internal Medicine

## 2013-08-19 ENCOUNTER — Ambulatory Visit (INDEPENDENT_AMBULATORY_CARE_PROVIDER_SITE_OTHER): Payer: Medicare Other | Admitting: Dietician

## 2013-08-19 VITALS — BP 136/76 | HR 96 | Temp 97.7°F | Ht 63.5 in | Wt 162.5 lb

## 2013-08-19 DIAGNOSIS — E1149 Type 2 diabetes mellitus with other diabetic neurological complication: Secondary | ICD-10-CM

## 2013-08-19 DIAGNOSIS — M545 Low back pain, unspecified: Secondary | ICD-10-CM | POA: Diagnosis not present

## 2013-08-19 DIAGNOSIS — M7582 Other shoulder lesions, left shoulder: Secondary | ICD-10-CM

## 2013-08-19 DIAGNOSIS — M67919 Unspecified disorder of synovium and tendon, unspecified shoulder: Secondary | ICD-10-CM

## 2013-08-19 DIAGNOSIS — E785 Hyperlipidemia, unspecified: Secondary | ICD-10-CM | POA: Diagnosis not present

## 2013-08-19 DIAGNOSIS — I10 Essential (primary) hypertension: Secondary | ICD-10-CM

## 2013-08-19 DIAGNOSIS — M719 Bursopathy, unspecified: Secondary | ICD-10-CM | POA: Diagnosis not present

## 2013-08-19 DIAGNOSIS — M79609 Pain in unspecified limb: Secondary | ICD-10-CM | POA: Diagnosis not present

## 2013-08-19 DIAGNOSIS — B351 Tinea unguium: Secondary | ICD-10-CM

## 2013-08-19 DIAGNOSIS — N058 Unspecified nephritic syndrome with other morphologic changes: Secondary | ICD-10-CM | POA: Diagnosis not present

## 2013-08-19 DIAGNOSIS — E1129 Type 2 diabetes mellitus with other diabetic kidney complication: Secondary | ICD-10-CM | POA: Diagnosis not present

## 2013-08-19 DIAGNOSIS — E1142 Type 2 diabetes mellitus with diabetic polyneuropathy: Secondary | ICD-10-CM

## 2013-08-19 DIAGNOSIS — Z9119 Patient's noncompliance with other medical treatment and regimen: Secondary | ICD-10-CM | POA: Diagnosis not present

## 2013-08-19 DIAGNOSIS — G8929 Other chronic pain: Secondary | ICD-10-CM | POA: Diagnosis not present

## 2013-08-19 DIAGNOSIS — N183 Chronic kidney disease, stage 3 unspecified: Secondary | ICD-10-CM | POA: Diagnosis not present

## 2013-08-19 DIAGNOSIS — E1140 Type 2 diabetes mellitus with diabetic neuropathy, unspecified: Secondary | ICD-10-CM

## 2013-08-19 DIAGNOSIS — Z91199 Patient's noncompliance with other medical treatment and regimen due to unspecified reason: Secondary | ICD-10-CM | POA: Diagnosis not present

## 2013-08-19 DIAGNOSIS — E119 Type 2 diabetes mellitus without complications: Secondary | ICD-10-CM | POA: Diagnosis not present

## 2013-08-19 LAB — BASIC METABOLIC PANEL WITH GFR
BUN: 33 mg/dL — ABNORMAL HIGH (ref 6–23)
CO2: 27 mEq/L (ref 19–32)
Calcium: 9.5 mg/dL (ref 8.4–10.5)
Chloride: 105 mEq/L (ref 96–112)
Creat: 1.31 mg/dL — ABNORMAL HIGH (ref 0.50–1.10)
GFR, Est African American: 50 mL/min — ABNORMAL LOW
GFR, Est Non African American: 44 mL/min — ABNORMAL LOW
Glucose, Bld: 94 mg/dL (ref 70–99)
Potassium: 4.4 mEq/L (ref 3.5–5.3)
Sodium: 141 mEq/L (ref 135–145)

## 2013-08-19 LAB — GLUCOSE, CAPILLARY: Glucose-Capillary: 85 mg/dL (ref 70–99)

## 2013-08-19 MED ORDER — GABAPENTIN 100 MG PO CAPS: 100.0000 mg | ORAL_CAPSULE | Freq: Every day | ORAL | Status: DC

## 2013-08-19 MED ORDER — INSULIN DETEMIR 100 UNIT/ML FLEXPEN: 50.0000 [IU] | PEN_INJECTOR | Freq: Every morning | SUBCUTANEOUS | Status: DC

## 2013-08-19 NOTE — Assessment & Plan Note (Signed)
Lab Results  Component Value Date   HGBA1C 11.1 07/20/2013   HGBA1C 9.5 04/09/2013   HGBA1C 8.9 11/26/2012     Assessment: Diabetes control:  poor Progress toward A1C goal:   deteriorated Comments: patient with occasional lows and a preponderance of highs. Will need close follow up in 2 weeks  Plan: Medications:  Levemir decreased to 50 units. continue with current premeal insulin Home glucose monitoring: Frequency:   Timing:   Instruction/counseling given: reminded to bring blood glucose meter & log to each visit Educational resources provided: brochure Self management tools provided: copy of home glucose meter download;home glucose logbook Other plans: patient to follow up with diabetes educator [name] today

## 2013-08-19 NOTE — Progress Notes (Signed)
Medical Nutrition Therapy:  Appt start time: 0109 end time:  1130. Last visit 07/2012 Assessment:  Primary concerns today: Blood sugar control., concerned about not gaining weight.   Patient here with meter download. She is marking her blood sugars. The values are variable with no obvious pattern except several lows fasting and several high fasting for no reason per patient. She is interested in a meter that will help her record her meal size and medications amounts.  She is working on understanding servings of  carbs and eating healthier. Has some difficulty with math- prefers servings or choices over grams.  Weight is stable with A1C >8.9% x 7 years. Says she is afraid of taking 15 units Novolog before meals. Discussed intensive CBGs before and after meals and at bedtime for next 3 days and to call with any concerns.  24-hr recall suggests intake of 1400-1600 kcal:  B - raisin bran or frosted rice krispies ~ 1 cup, 1 cup 2% milk, a banana and water ( ~ 4-5 carb servings)  L - sandwich- honey wheat bread,  fruit, water (3-4 carb servings)   D -  baked chicken, baked potato and green beans, water (2-3 carb servings ) Medications: levemir decreased to 50 units/day and Novolog increased to 15 units/meal three times a day.  estimated TDD 37- 89 unit/day, current TDD 95 units/day Estimated energy needs: 1500-1600 calories ~170 g carbohydrates ~75g protein ~60 g fat  Progress Towards Goal(s):  Minimal progress.   Nutritional Diagnosis:     [place]-2.2 Altered nutrition-related laboratory values As related to elevated glucose. As evidenced by high hemoglobin A1C.   Intervention: Reviewed carb counting with patient to assess understanding of which foods affect blood sugar and encouraged her to try to stay around 4- 5 servings (60-75 grams) with each meal and discouraged snacks to help her maintain her weight.   Reviewed symptoms and treatment of hypoglycemia with patient Coordination of care- discussed CBGs  and insulin with physician.  Teaching Method Utilized: Visual, Auditory,Hands on Handouts given during visit include:  accu chek 360 blood sugar, medication and meal size chart  Barriers to learning/adherence to lifestyle change: fear of low blood sugar, limited understanding of importance and how to keep carbs consistent Demonstrated degree of understanding via:  Teach Back   Monitoring/Evaluation: Dietary intake, blood sugars, insulin timing and amounts and weight in  2 weeks.

## 2013-08-19 NOTE — Patient Instructions (Addendum)
I will refer you to sports medicine for Left shoulder and right wrist pain for follow up I will refer you to podiatry for foot care I will change your levemir insulin to 50 units. Continue with your premeal insulin at 12 units I will decrease the dose of your gabapentin to 100 mg at night Please monitor your blood sugars and follow up with [place] in 2 weeks We are doing blood work today. I will follow up on the results. Depending on the results we might need to change your blood pressure medications

## 2013-08-19 NOTE — Assessment & Plan Note (Addendum)
-   patient is intolerant to gabapentin 300 mg at night - Will decrease to 100 mg q hs and reassess in response in 2 weeks - To follow up with sports medicine for worsening numbness in right hand. Possibly carpal tunnel but no characteristic tinels sign

## 2013-08-19 NOTE — Assessment & Plan Note (Signed)
Patient with likely onychomycosis b/l toes.  To follow up with podiatry for diabetic foot exams as well as onychomycosis

## 2013-08-19 NOTE — Patient Instructions (Signed)
Please Try to Eat 4- 5 SERVINGS OF CARBS AT [facility] MEAL  BREAKFAST- 5 SERVINGS RAISIN BRAN=2-3 SERVINGS  MILK=1 SERVING BANANA= 2 SERVNGS  LUNCH= [id number] 1 SANDWICH( 2 SLICES OF BREAD= 2 SERVINGS 1 BOX OF RAISINS(1.5 OZ) OR  CAN OF FRUIT 8 OZ= 2 SERVINGS  DINNER= 4-5 SERVINGS CARBS 2 BAKED POTATOES= 2 SERVINGS 1 CUP GREEN BEANS= 1 SERVING GRILLED CHICKEN- 0 SERVINGS  CONSIDER OVEN FRYING THE FISH AND CHICKEN FOR A HEALTHIER TASTY WAY TO COOK THEM  PLEASE WRITE DOWN BLOOD SUGARS AND HOW MUCH YOU EAT AND INSULIN ON THE PAPER GIVING TO YOU AND CALL ME WITH ANY CONCERNS  Autumn Gunn [phone]

## 2013-08-19 NOTE — Progress Notes (Addendum)
   Subjective:    Patient ID: [name], female    DOB: 1951/01/28, 63 y.o.   MRN: [id number]  HPI 63 year old female with PMH of CKD stage 3, hyperlipidemia, depression, breast cancer, DM type 2, HTN presents today for routine follow up. Patient complains of worsening right hand numbness and difficulty closing her right hand. This is chronic but has slowly been worsening. She also complains of chronic left shoulder pain for which she has been taking meloxicam prn with some relief but has also been gradually worsening.  Patient also complains of darkening of nails on feet - likely fungal infection.   Blood sugars noted. Patient with occasional low blood sugars in the morning and a lot of high blood sugars throughout the day. Reviewed blood sugars with patient    Review of Systems  Constitutional: Negative.   HENT: Negative.   Eyes: Negative.   Respiratory: Negative.   Cardiovascular: Positive for leg swelling.  Gastrointestinal: Negative.   Genitourinary: Negative.   Musculoskeletal:       Right hand numbness with decreased strength  Skin: Negative.   Neurological: Positive for numbness.       Right hand  Psychiatric/Behavioral: Negative.        Objective:   Physical Exam  Vitals reviewed. Constitutional: She is oriented to person, place, and time. She appears well-developed and well-nourished.  HENT:  Head: Atraumatic.  Neck: Normal range of motion.  Cardiovascular: Normal rate, regular rhythm and normal heart sounds.   Pulmonary/Chest: Effort normal and breath sounds normal.  Abdominal: Soft. Bowel sounds are normal.  Musculoskeletal:  Mildly decreased grip strength R hand, sensation intact over palm/dorsum and fingers Trace pedal edema +, Onychomycosis +  Neurological: She is alert and oriented to person, place, and time.  Skin: Skin is warm and dry.  Psychiatric: She has a normal mood and affect.          Assessment & Plan:  Please see problem based  charting for assessment and plan  Addendum: Prior to leaving patient complains of mild memory loss. Will reassess on next visit

## 2013-08-19 NOTE — Assessment & Plan Note (Signed)
-   Patient on meloxicam and triamterene/HCTZ with mildly worsened creatinine on last visit - Will recheck BMET today. If worsening creatinine would stop these meds and start an alternative regimen

## 2013-08-19 NOTE — Assessment & Plan Note (Signed)
-   Patient now with mildly worsening L shoulder pain - On meloxicam for pain. If worsening renal function will reconsider pain regimen - To follow up with sports medicine

## 2013-08-19 NOTE — Assessment & Plan Note (Signed)
BP Readings from Last 3 Encounters:  08/19/13 136/76  08/13/13 142/66  07/20/13 109/65    Lab Results  Component Value Date   NA 143 08/09/2013   K 3.4* 08/09/2013   CREATININE 1.4* 08/09/2013    Assessment: Blood pressure control:  at goal Progress toward BP goal:    Comments: Will recheck BMP today. May need to change regimen if worsening renal function  Plan: Medications:  continue current medications Educational resources provided: brochure Self management tools provided:   Other plans: Will follow up BMP and reassess

## 2013-09-02 ENCOUNTER — Ambulatory Visit: Payer: Medicare Other | Admitting: Dietician

## 2013-09-02 ENCOUNTER — Ambulatory Visit: Payer: Medicare Other | Admitting: Internal Medicine

## 2013-09-04 ENCOUNTER — Other Ambulatory Visit: Payer: Self-pay | Admitting: Internal Medicine

## 2013-09-06 ENCOUNTER — Ambulatory Visit: Payer: Medicare Other | Admitting: Dietician

## 2013-09-06 ENCOUNTER — Encounter: Payer: Self-pay | Admitting: Internal Medicine

## 2013-09-06 ENCOUNTER — Ambulatory Visit (INDEPENDENT_AMBULATORY_CARE_PROVIDER_SITE_OTHER): Payer: Medicare Other | Admitting: Internal Medicine

## 2013-09-06 VITALS — BP 122/77 | HR 87 | Temp 97.7°F | Ht 63.5 in | Wt 160.3 lb

## 2013-09-06 DIAGNOSIS — B351 Tinea unguium: Secondary | ICD-10-CM | POA: Diagnosis not present

## 2013-09-06 DIAGNOSIS — E119 Type 2 diabetes mellitus without complications: Secondary | ICD-10-CM | POA: Diagnosis not present

## 2013-09-06 DIAGNOSIS — N183 Chronic kidney disease, stage 3 unspecified: Secondary | ICD-10-CM | POA: Diagnosis not present

## 2013-09-06 DIAGNOSIS — E039 Hypothyroidism, unspecified: Secondary | ICD-10-CM

## 2013-09-06 DIAGNOSIS — E1149 Type 2 diabetes mellitus with other diabetic neurological complication: Secondary | ICD-10-CM

## 2013-09-06 DIAGNOSIS — I10 Essential (primary) hypertension: Secondary | ICD-10-CM | POA: Diagnosis not present

## 2013-09-06 LAB — GLUCOSE, CAPILLARY: Glucose-Capillary: 281 mg/dL — ABNORMAL HIGH (ref 70–99)

## 2013-09-06 MED ORDER — TERBINAFINE HCL 250 MG PO TABS: 250.0000 mg | ORAL_TABLET | Freq: Every day | ORAL | Status: DC

## 2013-09-06 MED ORDER — INSULIN ASPART 100 UNIT/ML FLEXPEN: 15.0000 [IU] | PEN_INJECTOR | Freq: Two times a day (BID) | SUBCUTANEOUS | Status: DC

## 2013-09-06 NOTE — Patient Instructions (Addendum)
-  Please increase Novolog from 12 U to 15 U -Will check your blood work today, will call you with abnormal results -Start taking lasmisil 250 mg daily for your toenail infection for 3 months -Dr. Dareen Piano will see you back in 2 months, pleasure meeting you!      General Instructions:   Please try to bring all your medicines next time. This will help [place] keep you safe from mistakes.   Progress Toward Treatment Goals:  Treatment Goal 06/17/2013  Hemoglobin A1C unable to assess  Blood pressure deteriorated    Self Care Goals & Plans:  Self Care Goal 09/06/2013  Manage my medications take my medicines as prescribed; bring my medications to every visit; refill my medications on time; follow the sick day instructions if I am sick  Monitor my health keep track of my blood glucose; bring my glucose meter and log to each visit; keep track of my blood pressure; keep track of my weight; check my feet daily  Eat healthy foods eat more vegetables; eat fruit for snacks and desserts; eat baked foods instead of fried foods; eat foods that are low in salt; eat smaller portions; drink diet soda or water instead of juice or soda  Be physically active find an activity I enjoy  Meeting treatment goals -    Home Blood Glucose Monitoring 06/17/2013  Check my blood sugar 3 times a day  When to check my blood sugar before meals     Care Management & Community Referrals:  Referral 06/17/2013  Referrals made for care management support diabetes educator  Referrals made to community resources -

## 2013-09-06 NOTE — Assessment & Plan Note (Addendum)
Assessment: Pt with hypothyroidism compliant with replacement therapy with normal TSH on 12/12/11 who presents with symptoms of thyroid dysfunction.   Plan:  -Obtain TSH level  -Continue levothyroxine 88 mcg daily and adjust based on TSH level    ADDENDUM: Pt's TSH returned low at 0.101. I called her with the results and instructed her to start taking 50 mcg daily of synthroid instead of 88 mcg daily. She is to return to have her TSH rechecked in 6 weeks.

## 2013-09-06 NOTE — Assessment & Plan Note (Signed)
Assessment: Pt with CKD Stage III on diuretic and NSAID therapy with last BMP on 08/19/13 with renal function at baseline.   Plan:  -Continue triamterene-HCTZ 37.5-25 mg daily for hypertension  -Continue meloxicam 15 mg PRN pain  -Consider ACEi therapy to prevent progression of nephropathy

## 2013-09-06 NOTE — Assessment & Plan Note (Addendum)
Assessment: Pt with last A1c of 11.1 on 07/20/13 compliant with insulin and oral hypoglycemic therapy with improved symptomatic hypoglycemia after change in insulin therapy who presents with CBG of 281.  Plan: -A1c 11.1 not at goal <7, increase Novolog 12 U TID to 15 U TID, continue metformin 1000 mg BID, Lantus 50 U daily  -BP 122/77 at goal <140/90, continue triamterene-HCTZ 37.5-25 mg daily, consider changing to ACEi to prevent progression of nephropathy  -LDL 111 not at goal <100, continue pravastatin 40 mg daily  -Last annual foot exam on 11/26/12 and eye exam on 12/25/12 -Obtain annual urine microalbumin (last one on 08/13/12 was normal)  -Continue gabapentin 100 mg daily for peripheral neuropathy  -BMI 27.95 at goal <30 -Continue aspirin 81 mg daily for primary CVD prevention

## 2013-09-06 NOTE — Assessment & Plan Note (Addendum)
Assessment: Pt with bilateral toenail thickening and crusting with probable onychomycosis without superimposed infection or toenail loss.  Plan:  -Obtain liver panel  -Last CBC normal on 08/09/13 -Start terbinafine 250 mg daily for 12 weeks of therapy

## 2013-09-06 NOTE — Assessment & Plan Note (Addendum)
Assessment: Pt with well-controlled hypertension compliant with one-class (diuretic) anti-hypertensive therapy who presents with blood pressure of 122/77.   Plan:  -BP 122/77 at goal <140/90 -Continue triamterene-HCTZ 37.5-25 mg daily, consider changing to ACEi to prevent progression of nephropathy  -Last BMP on 08/19/13 with stable renal function

## 2013-09-06 NOTE — Progress Notes (Signed)
Patient ID: [name], female   DOB: [birth year], 63 y.o.   MRN: [id number]    Subjective:   Patient ID: [name] female   DOB: Jul 27, 1950 63 y.o.   MRN: [id number]  HPI: [name] is a 63 y.o. pleasant woman with past medical history of right breast cancer s/p mastectomy 1999, insulin-dependent Type II DM, hypertension, hyperlipidemia, hypothyroidism on replacement therapy, CKD Stage III, and depression who presents for follow-up visit of recently changed insulin therapy.   She was seen by her PCP, Dr. Dareen Piano on 08/19/13 where her Lantus was decreased from 60 U to 50 U due to symptomatic hypoglycemia which she reports has improved. Her average blood sugar range since last visit is 94-414. She is also on Novolog 12 U TID and metformin 1000 mg BID. She has chronic polydipsia but denies polyuria, polyphagia, blurry vision, or foot ulcer/injury. She has chronic neuropathy and her gabapentin at last visit was decreased from 300 mg daily to 100 mg daily. She tries to follow a healthy diet and exercise regularly. Her weight has been stable with alternating weight loss and gain.   She is compliant with taking synthroid 88 mcg daily. She reports feeling hot/cold, constipation, dry skin, and occasionally with sensation of choking.   She is also compliant with taking triamterene-HCTZ . She has occasional pedal edema and lightheadedness but denies headache, blurry vision, chest pain, or palpitations.   She has had bilateral toenail fungus with pain not relieved with topical therapy. She would like to try oral therapy. She denies history of toenail loss.     Past Medical History  Diagnosis Date  . Allergy   . Anemia   . Depression   . Arthritis   . Cancer   . Cataract   . Diabetes mellitus without complication   . Glaucoma   . Hyperlipidemia   . Hypertension   . Chronic kidney disease   . Neuromuscular disorder   . Thyroid disease   . Left arm pain 07/28/2012  . BREAST CANCER, HX  OF 04/16/2006    Qualifier: Diagnosis of  By: Leward Quan MD, Pamala Hurry    . Diverticulosis    Current Outpatient Prescriptions  Medication Sig Dispense Refill  . acetaminophen (TYLENOL) 500 MG tablet Take 1 tablet (500 mg total) by mouth every 6 (six) hours as needed.  50 tablet  3  . aspirin 81 MG tablet Take 81 mg by mouth daily.      . B-D ULTRAFINE III SHORT PEN 31G X 8 MM MISC       . Cholecalciferol (VITAMIN D-3) 1000 UNITS CAPS Take 1,000 Units by mouth.      . clobetasol cream (TEMOVATE) 0.05 %       . cycloSPORINE (RESTASIS) 0.05 % ophthalmic emulsion Place 1 drop into both eyes 2 (two) times daily.      [name] gabapentin (NEURONTIN) 100 MG capsule Take 1 capsule (100 mg total) by mouth at bedtime.  30 capsule  0  . glucose 4 GM chewable tablet Chew 4 tablets (16 g total) by mouth as needed for low blood sugar.  50 tablet  12  . insulin aspart (NOVOLOG FLEXPEN) 100 UNIT/ML FlexPen Inject 12 Units into the skin 2 (two) times daily before lunch and supper.  18 mL  3  . Insulin Detemir (LEVEMIR) 100 UNIT/ML Pen Inject 50 Units into the skin every morning.  15 mL  3  . Insulin Syringe-Needle U-100 (B-D INS SYR ULTRAFINE .5CC/30G) 30G X 1/2" 0.5  ML MISC To use 4 times daily for injecting insulin. diag code 250.42. Insulin dependent  150 each  6  . ketorolac (ACULAR) 0.4 % SOLN Place 1 drop into the left eye 3 (three) times daily.      [name] levothyroxine (SYNTHROID, LEVOTHROID) 88 MCG tablet TAKE 1 TABLET BY MOUTH DAILY  30 tablet  0  . meloxicam (MOBIC) 15 MG tablet TAKE 1 TABLET BY MOUTH DAILY AS NEEDED FOR PAIN  30 tablet  0  . metFORMIN (GLUCOPHAGE) 1000 MG tablet TAKE 1 TABLET BY MOUTH TWICE DAILY WITH MEALS  60 tablet  3  . olopatadine (PATANOL) 0.1 % ophthalmic solution 1 drop 2 (two) times daily as needed.      . pravastatin (PRAVACHOL) 40 MG tablet TAKE 1 TABLET BY MOUTH EVERY DAY  30 tablet  3  . Travoprost, BAK Free, (TRAVATAN) 0.004 % SOLN ophthalmic solution 1 drop at bedtime.      .  triamterene-hydrochlorothiazide (MAXZIDE-25) 37.5-25 MG per tablet TAKE 1/2 TABLET BY MOUTH EVERY DAY  45 tablet  3  . venlafaxine XR (EFFEXOR-XR) 75 MG 24 hr capsule TAKE ONE CAPSULE BY MOUTH EVERY DAY  30 capsule  3   No current facility-administered medications for this visit.   Family History  Problem Relation Age of Onset  . Heart disease Father   . Diabetes Father   . Stroke Sister   . Anuerysm Sister   . Heart disease Brother   . Anuerysm Brother   . Cancer Daughter     breast ca   History   Social History  . Marital Status: Divorced    Spouse Name: N/A    Number of Children: N/A  . Years of Education: N/A   Social History Main Topics  . Smoking status: Never Smoker   . Smokeless tobacco: Never Used  . Alcohol Use: No  . Drug Use: No  . Sexual Activity: None   Other Topics Concern  . None   Social History Narrative  . None   Review of Systems: Review of Systems  Constitutional: Negative for fever, chills and weight loss.  HENT:       Occasional sensation of choking  Eyes: Negative for blurred vision.  Respiratory: Negative for cough and shortness of breath.   Cardiovascular: Negative for chest pain and palpitations.       Pedal edema  Gastrointestinal: Positive for constipation. Negative for nausea, vomiting, abdominal pain and diarrhea.  Genitourinary: Negative for dysuria, urgency and frequency.  Musculoskeletal: Positive for joint pain (left shoulder).  Skin:       Dry skin. Toenail fungus.   Neurological: Positive for dizziness and sensory change (chronic peripheral neuropathy). Negative for headaches.  Endo/Heme/Allergies: Positive for polydipsia (chronic).     Objective:  Physical Exam: Filed Vitals:   09/06/13 1033  BP: 122/77  Pulse: 87  Temp: 97.7 F (36.5 C)  TempSrc: Oral  Height: 5' 3.5" (1.613 m)  Weight: 160 lb 4.8 oz (72.712 kg)  SpO2: 97%    Physical Exam  Constitutional: She is oriented to person, place, and time. She  appears well-developed and well-nourished.  HENT:  Head: Normocephalic and atraumatic.  Eyes: EOM are normal.  Neck: Normal range of motion. Neck supple. No thyromegaly present.  Cardiovascular: Normal rate, regular rhythm and normal heart sounds.   Pulmonary/Chest: Effort normal and breath sounds normal. No respiratory distress. She has no wheezes. She has no rales.  Abdominal: Soft. Bowel sounds are normal. She exhibits no  distension. There is no tenderness. There is no rebound and no guarding.  Musculoskeletal: Normal range of motion. She exhibits no edema and no tenderness.  Neurological: She is alert and oriented to person, place, and time.  Skin: Skin is warm and dry. No rash noted. No erythema. No pallor.  Bilateral toenail thickening with crusting   Psychiatric: She has a normal mood and affect. Her behavior is normal. Judgment and thought content normal.    Assessment & Plan:   Please see problem list for problem-based assessment and plan

## 2013-09-07 LAB — HEPATIC FUNCTION PANEL
ALT: 8 U/L (ref 0–35)
AST: 14 U/L (ref 0–37)
Albumin: 4.3 g/dL (ref 3.5–5.2)
Alkaline Phosphatase: 64 U/L (ref 39–117)
Bilirubin, Direct: 0.1 mg/dL (ref 0.0–0.3)
Indirect Bilirubin: 0.3 mg/dL (ref 0.2–1.2)
Total Bilirubin: 0.4 mg/dL (ref 0.2–1.2)
Total Protein: 6.8 g/dL (ref 6.0–8.3)

## 2013-09-07 LAB — MICROALBUMIN / CREATININE URINE RATIO
Creatinine, Urine: 71.2 mg/dL
Microalb Creat Ratio: 26.3 mg/g (ref 0.0–30.0)
Microalb, Ur: 1.87 mg/dL (ref 0.00–1.89)

## 2013-09-07 LAB — TSH: TSH: 0.101 u[IU]/mL — ABNORMAL LOW (ref 0.350–4.500)

## 2013-09-07 NOTE — Progress Notes (Signed)
Case discussed with Dr. Rabbani at the time of the visit.  We reviewed the resident's history and exam and pertinent patient test results.  I agree with the assessment, diagnosis, and plan of care documented in the resident's note. 

## 2013-09-08 MED ORDER — LEVOTHYROXINE SODIUM 50 MCG PO TABS: ORAL_TABLET | ORAL | Status: DC

## 2013-09-08 NOTE — Addendum Note (Signed)
Addended byJuluis Mire on: 09/08/2013 04:52 PM   Modules accepted: Orders

## 2013-09-12 ENCOUNTER — Encounter (HOSPITAL_COMMUNITY): Payer: Self-pay | Admitting: Emergency Medicine

## 2013-09-12 ENCOUNTER — Emergency Department (HOSPITAL_COMMUNITY): Payer: Medicare Other

## 2013-09-12 ENCOUNTER — Emergency Department (HOSPITAL_COMMUNITY)
Admission: EM | Admit: 2013-09-12 | Discharge: 2013-09-12 | Disposition: A | Discharge status: Home / Self Care | Payer: Medicare Other | Attending: Emergency Medicine | Admitting: Emergency Medicine

## 2013-09-12 DIAGNOSIS — R739 Hyperglycemia, unspecified: Secondary | ICD-10-CM

## 2013-09-12 DIAGNOSIS — R259 Unspecified abnormal involuntary movements: Secondary | ICD-10-CM | POA: Insufficient documentation

## 2013-09-12 DIAGNOSIS — R5381 Other malaise: Secondary | ICD-10-CM | POA: Insufficient documentation

## 2013-09-12 DIAGNOSIS — E785 Hyperlipidemia, unspecified: Secondary | ICD-10-CM | POA: Diagnosis not present

## 2013-09-12 DIAGNOSIS — R42 Dizziness and giddiness: Secondary | ICD-10-CM | POA: Insufficient documentation

## 2013-09-12 DIAGNOSIS — Z862 Personal history of diseases of the blood and blood-forming organs and certain disorders involving the immune mechanism: Secondary | ICD-10-CM | POA: Diagnosis not present

## 2013-09-12 DIAGNOSIS — R55 Syncope and collapse: Secondary | ICD-10-CM | POA: Insufficient documentation

## 2013-09-12 DIAGNOSIS — E119 Type 2 diabetes mellitus without complications: Secondary | ICD-10-CM | POA: Diagnosis not present

## 2013-09-12 DIAGNOSIS — F329 Major depressive disorder, single episode, unspecified: Secondary | ICD-10-CM | POA: Diagnosis not present

## 2013-09-12 DIAGNOSIS — R5383 Other fatigue: Secondary | ICD-10-CM | POA: Diagnosis not present

## 2013-09-12 DIAGNOSIS — I10 Essential (primary) hypertension: Secondary | ICD-10-CM | POA: Diagnosis not present

## 2013-09-12 DIAGNOSIS — Z880 Allergy status to penicillin: Secondary | ICD-10-CM | POA: Diagnosis not present

## 2013-09-12 DIAGNOSIS — F3289 Other specified depressive episodes: Secondary | ICD-10-CM | POA: Diagnosis not present

## 2013-09-12 DIAGNOSIS — Z853 Personal history of malignant neoplasm of breast: Secondary | ICD-10-CM | POA: Insufficient documentation

## 2013-09-12 DIAGNOSIS — R7309 Other abnormal glucose: Secondary | ICD-10-CM | POA: Diagnosis not present

## 2013-09-12 DIAGNOSIS — Z7982 Long term (current) use of aspirin: Secondary | ICD-10-CM | POA: Diagnosis not present

## 2013-09-12 DIAGNOSIS — Z794 Long term (current) use of insulin: Secondary | ICD-10-CM | POA: Insufficient documentation

## 2013-09-12 DIAGNOSIS — Z8719 Personal history of other diseases of the digestive system: Secondary | ICD-10-CM | POA: Insufficient documentation

## 2013-09-12 DIAGNOSIS — F411 Generalized anxiety disorder: Secondary | ICD-10-CM | POA: Diagnosis not present

## 2013-09-12 DIAGNOSIS — Z79899 Other long term (current) drug therapy: Secondary | ICD-10-CM | POA: Insufficient documentation

## 2013-09-12 DIAGNOSIS — M129 Arthropathy, unspecified: Secondary | ICD-10-CM | POA: Insufficient documentation

## 2013-09-12 DIAGNOSIS — I129 Hypertensive chronic kidney disease with stage 1 through stage 4 chronic kidney disease, or unspecified chronic kidney disease: Secondary | ICD-10-CM | POA: Diagnosis not present

## 2013-09-12 DIAGNOSIS — Z8669 Personal history of other diseases of the nervous system and sense organs: Secondary | ICD-10-CM | POA: Insufficient documentation

## 2013-09-12 DIAGNOSIS — N183 Chronic kidney disease, stage 3 unspecified: Secondary | ICD-10-CM | POA: Diagnosis not present

## 2013-09-12 DIAGNOSIS — R7301 Impaired fasting glucose: Secondary | ICD-10-CM | POA: Diagnosis not present

## 2013-09-12 DIAGNOSIS — Z859 Personal history of malignant neoplasm, unspecified: Secondary | ICD-10-CM | POA: Diagnosis not present

## 2013-09-12 LAB — BASIC METABOLIC PANEL
Anion gap: 15 (ref 5–15)
BUN: 23 mg/dL (ref 6–23)
CO2: 21 mEq/L (ref 19–32)
Calcium: 9.5 mg/dL (ref 8.4–10.5)
Chloride: 103 mEq/L (ref 96–112)
Creatinine, Ser: 0.9 mg/dL (ref 0.50–1.10)
GFR calc Af Amer: 77 mL/min — ABNORMAL LOW (ref >90)
GFR calc non Af Amer: 67 mL/min — ABNORMAL LOW (ref >90)
Glucose, Bld: 323 mg/dL — ABNORMAL HIGH (ref 70–99)
Potassium: 3.4 mEq/L — ABNORMAL LOW (ref 3.7–5.3)
Sodium: 139 mEq/L (ref 137–147)

## 2013-09-12 LAB — CBC
HCT: 33.3 % — ABNORMAL LOW (ref 36.0–46.0)
Hemoglobin: 11.4 g/dL — ABNORMAL LOW (ref 12.0–15.0)
MCH: 30.2 pg (ref 26.0–34.0)
MCHC: 34.2 g/dL (ref 30.0–36.0)
MCV: 88.3 fL (ref 78.0–100.0)
Platelets: 215 10*3/uL (ref 150–400)
RBC: 3.77 MIL/uL — ABNORMAL LOW (ref 3.87–5.11)
RDW: 12.5 % (ref 11.5–15.5)
WBC: 8.8 10*3/uL (ref 4.0–10.5)

## 2013-09-12 LAB — URINALYSIS, ROUTINE W REFLEX MICROSCOPIC
Bilirubin Urine: NEGATIVE
Glucose, UA: >1000 mg/dL — AB
Hgb urine dipstick: NEGATIVE
Ketones, ur: 15 mg/dL — AB
Leukocytes, UA: NEGATIVE
Nitrite: NEGATIVE
Protein, ur: 30 mg/dL — AB
Specific Gravity, Urine: 1.015 (ref 1.005–1.030)
Urobilinogen, UA: 0.2 mg/dL (ref 0.0–1.0)
pH: 7 (ref 5.0–8.0)

## 2013-09-12 LAB — I-STAT TROPONIN, ED
Troponin i, poc: 0.01 ng/mL (ref 0.00–0.08)
Troponin i, poc: 0.02 ng/mL (ref 0.00–0.08)

## 2013-09-12 LAB — URINE MICROSCOPIC-ADD ON

## 2013-09-12 LAB — CBG MONITORING, ED: Glucose-Capillary: 318 mg/dL — ABNORMAL HIGH (ref 70–99)

## 2013-09-12 MED ORDER — LORAZEPAM 2 MG/ML IJ SOLN
1.0000 mg | Freq: Once | INTRAMUSCULAR | Status: AC
  Administered 2013-09-12: 1 mg via INTRAVENOUS
  Filled 2013-09-12: qty 1

## 2013-09-12 MED ORDER — SODIUM CHLORIDE 0.9 % IV BOLUS (SEPSIS)
1000.0000 mL | Freq: Once | INTRAVENOUS | Status: AC
  Administered 2013-09-12: 1000 mL via INTRAVENOUS

## 2013-09-12 NOTE — ED Notes (Signed)
Per EMS: PT at church standing up praying when she started to feel lightheaded and weak, reports near syncope. Denies diaphoresis, CP, SOB. CBG 376. Neuro intact, denies weakness. Pt tearful and states " I just don't feel well." Pt also reports chronic back pain. 160/83. 93 NSR. 16 RR. 100% 4L [place].

## 2013-09-12 NOTE — ED Provider Notes (Signed)
CSN: [id number]     Arrival date & time 09/12/13  1401 History   First MD Initiated Contact with Patient 09/12/13 1405     Chief Complaint  Patient presents with  . Near Syncope     (Consider location/radiation/quality/duration/timing/severity/associated sxs/prior Treatment) Patient is a 63 y.o. female presenting with near-syncope.  Near Syncope Associated symptoms include weakness. Pertinent negatives include no abdominal pain, chest pain, chills, diaphoresis, fever, headaches, nausea, numbness or vomiting.    [name] is a 63 year old woman with DM2 (last A1c 11.1), HTN, CKD stage 3, s/p R breast mastectomy who had an episode of syncope this morning. Patient was a poor historian and history provided by daughter and sister-in-law. She was sitting at church when she started feeling very weak. She leaned against her sister-in-law who said that the patient's eyes rolled back in her head and she passed out. She did not hit her head. Family says that her limbs and body were shaking. She does not remember any of this. She recently was seen by her PCP who increased her novolog from 12u to 15 u TID.  Past Medical History  Diagnosis Date  . Allergy   . Anemia   . Depression   . Arthritis   . Cancer   . Cataract   . Diabetes mellitus without complication   . Glaucoma   . Hyperlipidemia   . Hypertension   . Chronic kidney disease   . Neuromuscular disorder   . Thyroid disease   . Left arm pain 07/28/2012  . BREAST CANCER, HX OF 04/16/2006    Qualifier: Diagnosis of  By: Leward Quan MD, Pamala Hurry    . Diverticulosis    Past Surgical History  Procedure Laterality Date  . Abdominal hysterectomy    . Breast surgery    . Eye surgery    . Tubal ligation     Family History  Problem Relation Age of Onset  . Heart disease Father   . Diabetes Father   . Stroke Sister   . Anuerysm Sister   . Heart disease Brother   . Anuerysm Brother   . Cancer Daughter     breast ca   History  Substance  Use Topics  . Smoking status: Never Smoker   . Smokeless tobacco: Never Used  . Alcohol Use: No   OB History   Grav Para Term Preterm Abortions TAB SAB Ect Mult Living                 Review of Systems  Constitutional: Negative for fever, chills and diaphoresis.  Respiratory: Negative for shortness of breath.   Cardiovascular: Positive for near-syncope. Negative for chest pain and palpitations.  Gastrointestinal: Negative for nausea, vomiting and abdominal pain.  Endocrine: Negative for polyuria.  Genitourinary: Negative for dysuria.  Neurological: Positive for tremors, syncope, weakness and light-headedness. Negative for numbness and headaches.      Allergies  Gabapentin; Penicillins; and Sulfonamide derivatives  Home Medications   Prior to Admission medications   Medication Sig Start Date End Date Taking? Authorizing Provider  acetaminophen (TYLENOL) 325 MG tablet Take 325 mg by mouth every 8 (eight) hours as needed (pain).   Yes Historical Provider, MD  aspirin EC 81 MG tablet Take 81 mg by mouth at bedtime.   Yes Historical Provider, MD  Cholecalciferol (VITAMIN D-3) 1000 UNITS CAPS Take 1,000 Units by mouth daily.   Yes Historical Provider, MD  clobetasol cream (TEMOVATE) 7.78 % Apply 1 application topically 2 (two)  times daily as needed (rash).  08/08/13  Yes Historical Provider, MD  cycloSPORINE (RESTASIS) 0.05 % ophthalmic emulsion Place 1 drop into both eyes 2 (two) times daily.   Yes Historical Provider, MD  gabapentin (NEURONTIN) 100 MG capsule Take 1 capsule (100 mg total) by mouth at bedtime. 08/19/13 08/19/14 Yes Nischal Narendra, MD  glucose 4 GM chewable tablet Chew 4 tablets (16 g total) by mouth as needed for low blood sugar. 05/28/12  Yes Alejandro Paya, DO  insulin aspart (NOVOLOG FLEXPEN) 100 UNIT/ML FlexPen Inject 15 Units into the skin 3 (three) times daily with meals.   Yes Historical Provider, MD  Insulin Detemir (LEVEMIR FLEXTOUCH) 100 UNIT/ML Pen Inject 50  Units into the skin at bedtime.   Yes Historical Provider, MD  levothyroxine (SYNTHROID, LEVOTHROID) 50 MCG tablet Take 50 mcg by mouth daily before breakfast.   Yes Historical Provider, MD  meloxicam (MOBIC) 15 MG tablet Take 15 mg by mouth daily as needed for pain.   Yes Historical Provider, MD  metFORMIN (GLUCOPHAGE) 1000 MG tablet Take 1,000 mg by mouth 2 (two) times daily with a meal.   Yes Historical Provider, MD  olopatadine (PATANOL) 0.1 % ophthalmic solution Place 1 drop into both eyes 2 (two) times daily.    Yes Historical Provider, MD  Polyvinyl Alcohol-Povidone (REFRESH OP) Place 1 drop into both eyes 3 (three) times daily.   Yes Historical Provider, MD  pravastatin (PRAVACHOL) 40 MG tablet Take 40 mg by mouth daily after supper.   Yes Historical Provider, MD  Travoprost, BAK Free, (TRAVATAN) 0.004 % SOLN ophthalmic solution Place 1 drop into both eyes at bedtime.    Yes Historical Provider, MD  triamterene-hydrochlorothiazide (MAXZIDE-25) 37.5-25 MG per tablet Take 0.5 tablets by mouth daily.   Yes Historical Provider, MD  venlafaxine XR (EFFEXOR-XR) 75 MG 24 hr capsule Take 75 mg by mouth at bedtime.   Yes Historical Provider, MD  B-D ULTRAFINE III SHORT PEN 31G X 8 MM MISC  04/02/13   Historical Provider, MD  Insulin Syringe-Needle U-100 (B-D INS SYR ULTRAFINE .5CC/30G) 30G X 1/2" 0.5 ML MISC To use 4 times daily for injecting insulin. diag code 250.42. Insulin dependent 08/27/12   Dominic Pea, DO  terbinafine (LAMISIL) 250 MG tablet Take 1 tablet (250 mg total) by mouth daily. 09/06/13   Marjan Rabbani, MD   BP 165/82  Pulse 92  Resp 22  SpO2 100% Physical Exam  Vitals reviewed. Constitutional: She is oriented to person, place, and time. She appears well-developed and well-nourished. She appears distressed.  Tearful, anxious  HENT:  Head: Normocephalic and atraumatic.  Mouth/Throat: Oropharynx is clear and moist.  Neck: Neck supple.  No carotid bruit  Cardiovascular: Normal  rate, regular rhythm and intact distal pulses.  Exam reveals no gallop and no friction rub.   No murmur heard. Pulmonary/Chest: Effort normal and breath sounds normal. No respiratory distress.  Abdominal: Soft. Bowel sounds are normal. She exhibits no distension. There is no tenderness.  Musculoskeletal: She exhibits no edema.  Neurological: She is alert and oriented to person, place, and time. She has normal reflexes. No cranial nerve deficit. She exhibits normal muscle tone. Coordination normal.  Skin: She is not diaphoretic.    ED Course  Procedures (including critical care time) Labs Review Labs Reviewed  CBC - Abnormal; Notable for the following:    RBC 3.77 (*)    Hemoglobin 11.4 (*)    HCT 33.3 (*)    All other components within  normal limits  CBG MONITORING, ED - Abnormal; Notable for the following:    Glucose-Capillary 318 (*)    All other components within normal limits  BASIC METABOLIC PANEL  URINALYSIS, ROUTINE W REFLEX MICROSCOPIC  I-STAT TROPOININ, ED    Imaging Review Ct Head Wo Contrast  09/12/2013   CLINICAL DATA:  Near syncope.  History of breast cancer.  EXAM: CT HEAD WITHOUT CONTRAST  TECHNIQUE: Contiguous axial images were obtained from the base of the skull through the vertex without intravenous contrast.  COMPARISON:  None.  FINDINGS: The brain appears normal without infarct, hemorrhage, mass lesion, mass effect, midline shift or abnormal extra-axial fluid collection. There is no hydrocephalus or pneumocephalus. Right mastoid effusion is noted. The calvarium is intact. No focal bony lesion is identified. Small focus of soft tissue swelling over the right frontal bone may be due to some sebaceous cyst.  IMPRESSION: No acute intracranial abnormality.  Right mastoid effusion.   Electronically Signed   By: Inge Rise M.D.   On: 09/12/2013 15:47     EKG Interpretation None      MDM   Final diagnoses:  None    2:49PM: Patient had witnessed syncopal  episode v seizure. No history of syncope or seizures. CBG in 300s so unlikely related to DM. Given history of breast cancer, will get head CT. She is very anxious so will give ativan 1 mg once w 1 L NS, and check EKG, troponin, BMP, CBC, UA and orthostatics and reassess.  4:12PM: CT no acute intracranial process. EKG w minimal ST elevation new when compared to prior so will get 3 hour troponin (first negative) as she has no chest pain complaints.  4:19PM: Patient signed out to Christy Close, MD 09/12/13 [phone]

## 2013-09-12 NOTE — ED Provider Notes (Signed)
6:01 PM BP 142/61  Pulse 88  Resp 15  SpO2 97%  assumed care of the patien tfrom Dr. Dina Rich here for near syncope. EKG changes noted. negative delta trop No cp. UA negative. Plan- If tropnin neg then D/c with pcp f/u.   Negative delta troponin.  patientw walking, speaking and mentating appropriately. She has tolerated PO Food/fluids. Patient orthostatics negative. I discussed the findings of todays work up with the aptient who appears safe for discharge. She may follow up wit her pcp.  Margarita Mail, PA-C 09/15/13 [phone]

## 2013-09-12 NOTE — ED Notes (Signed)
Md Horton at bedside.

## 2013-09-12 NOTE — ED Notes (Signed)
Pt very dizzy and unsteady while standing

## 2013-09-12 NOTE — ED Notes (Signed)
Pt ambulated in room. Pt c/o some lightheadedness and a "diabetic taste" in her mouth. She explained that it is the tastes she gets when her sugar is high. Pt showed no s/s of sob, moved slowly due to her concern of getting dizzy.

## 2013-09-12 NOTE — Discharge Instructions (Signed)
Near-Syncope Near-syncope (commonly known as near fainting) is sudden weakness, dizziness, or feeling like you might pass out. During an episode of near-syncope, you may also develop pale skin, have tunnel vision, or feel sick to your stomach (nauseous). Near-syncope may occur when getting up after sitting or while standing for a long time. It is caused by a sudden decrease in blood flow to the brain. This decrease can result from various causes or triggers, most of which are not serious. However, because near-syncope can sometimes be a sign of something serious, a medical evaluation is required. The specific cause is often not determined. HOME CARE INSTRUCTIONS  Monitor your condition for any changes. The following actions may help to alleviate any discomfort you are experiencing:  Have someone stay with you until you feel stable.  Lie down right away and prop your feet up if you start feeling like you might faint. Breathe deeply and steadily. Wait until all the symptoms have passed. Most of these episodes last only a few minutes. You may feel tired for several hours.   Drink enough fluids to keep your urine clear or pale yellow.   If you are taking blood pressure or heart medicine, get up slowly when seated or lying down. Take several minutes to sit and then stand. This can reduce dizziness.  Follow up with your health care provider as directed. SEEK IMMEDIATE MEDICAL CARE IF:   You have a severe headache.   You have unusual pain in the chest, abdomen, or back.   You are bleeding from the mouth or rectum, or you have black or tarry stool.   You have an irregular or very fast heartbeat.   You have repeated fainting or have seizure-like jerking during an episode.   You faint when sitting or lying down.   You have confusion.   You have difficulty walking.   You have severe weakness.   You have vision problems.  MAKE SURE YOU:   Understand these instructions.  Will  watch your condition.  Will get help right away if you are not doing well or get worse. Document Released: 01/14/2005 Document Revised: 01/19/2013 Document Reviewed: 06/19/2012 [employer] Patient Information 2015 [employer]. This information is not intended to replace advice given to you by your health care provider. Make sure you discuss any questions you have with your health care provider.  

## 2013-09-13 NOTE — ED Provider Notes (Signed)
I saw and evaluated the patient, reviewed the resident's note and I agree with the findings and plan.   EKG Interpretation Sinus rhythm Left anterior fascicular block Abnormal R-wave progression, early transition Left ventricular hypertrophy ST elevation, consider inferior injury Prolonged QT interval LAF block and minimal ST elevation new when compared to prior Confirmed by HORTON MD, Loma Sousa ([id number]) on 09/12/2013 3:26:54 PM      Patient presents following a loss of consciousness at church.  Patient is very anxious and unable to provide much history. She states that she remembers praying in church and then feeling weak. She then woke up. Per the patient's daughter and sister, patient appeared to pass out. She was out for approximately 1 minute.  The patient's sister did note that her arms shook. No history of seizure or syncope. The patient woke she was tired but not disoriented. She denies any chest pain or shortness of breath prior to this episode. She does have a history of breast cancer. On exam, patient is very tearful and anxious appearing. Today's her birthday and she has had a lot of family and feels overwhelmed. Initial vital signs are reassuring. She is nonfocal on exam.  EKG shows minimal ST elevation inferiorly and laterally which does appear changed from prior. Patient has not had any chest pain. Initial troponin is negative. No other evidence of arrhythmia. Patient did recently have changes in her diabetes medications. Blood glucose for EMS 376.  Patient was given fluids and Ativan for her anxiety. Basic labwork obtained. Noted to be hyperglycemic without a gap. No evidence of UTI. Head CT obtained given history of breast cancer and without evidence of mass or bleed.  Orthostatics negative. Given the EKG changes, will obtain a delta troponin. Patient remained chest pain-free and reports improvement following fluids and anxiety medicine.  Suspect patient had a syncopal episode given  history. Even with the shaking, patient did not appear to have a postictal period.   Signed out pending delta troponin. Patient will need to ambulate and tolerate by mouth prior to discharge. She will need to followup closely with her primary care physician.  Merryl Hacker, MD 09/13/13 [phone]

## 2013-09-14 ENCOUNTER — Other Ambulatory Visit: Payer: Self-pay | Admitting: Internal Medicine

## 2013-09-17 NOTE — ED Provider Notes (Signed)
Medical screening examination/treatment/procedure(s) were performed by non-physician practitioner and as supervising physician I was immediately available for consultation/collaboration.   EKG Interpretation None        Debby Freiberg, MD 09/17/13 1555

## 2013-09-20 ENCOUNTER — Other Ambulatory Visit: Payer: Self-pay | Admitting: Dietician

## 2013-09-20 ENCOUNTER — Ambulatory Visit (INDEPENDENT_AMBULATORY_CARE_PROVIDER_SITE_OTHER): Payer: Medicare Other | Admitting: Dietician

## 2013-09-20 ENCOUNTER — Encounter: Payer: Self-pay | Admitting: Dietician

## 2013-09-20 VITALS — Wt 157.3 lb

## 2013-09-20 DIAGNOSIS — E1149 Type 2 diabetes mellitus with other diabetic neurological complication: Secondary | ICD-10-CM

## 2013-09-20 NOTE — Progress Notes (Signed)
Discussed decreasing Novolog  dose to 10-12 units with meals.

## 2013-09-20 NOTE — Progress Notes (Signed)
Medical Nutrition Therapy:  Appt start time: 1025 end time:  1115. Last visit 07/2013 Assessment:  Primary concerns today: Blood sugar control., concerned about not gaining weight.   Patient reports she is doing"fair". Happy her weight is decreased 3 #. Recently in ED for syncope not felt related her blood sugars.  again says she is forgetful and is concerned this is a medication side affect. Did not bring accu chek 360 recording of her insulin, blood sugar and meal size. Is not taking her Novolog when CBgs are low before meals ( by which she clarifies in the 70s and 80s),  reports that she takes the Novolog after meals when her blood sugar is high at times and also that she has been taking 12 units Novolog often only 1-2 times a day and the 15 units only one time in the past 3 days. She says most of these things she is doing because she is afraid the insulin is going to drop her blood sugar too low.  CBG fasting today 208, 230 in office after no Novolog and a little bit of grits Meter download shows much improved blood sugars since ED visit- average since ED 170, average prior to ED was 287. Patient says this is because she is taking her Levemir more regularly. ( She also mentioned that she sometimes falls asleep before taking it and takes later in the night.  She is working on eating healthy, not a candidate for carb counting, she being encouraged to be consistent in her food intake. Weight is decreased. Says she has taken 15 units Novolog a few times and often takes it after meal when blood sugars very high.  24-hr recall suggests intake of 1400-1600 kcal:  B - raisin bran or frosted rice krispies ~ 1 cup, 1 cup 2% milk, a banana and water or a pack of grits and eggs, sometimes milk,sometimes juice (2-5 carb servings)  L - sandwich- honey wheat bread,  fruit, sometimes only has a little debbie oatmeal cake- water (2-4 carb servings)   D -  baked chicken, baked potato and green beans sometimes with  corn,sometimes has cornbread, water (2-4 carb servings ) Medications: levemir decreased to 50 units/day and Novolog increased to 15 units/meal three times a day.  estimated TDD 37- 89 unit/day, current TDD 95 units/day Estimated energy needs: 1500-1600 calories ~170 g carbohydrates ~75g protein ~60 g fat  Progress Towards Goal(s):  Minimal progress.   Nutritional Diagnosis:     [place]-2.2 Altered nutrition-related laboratory values As related to elevated glucose improving as evidenced by trend graph on recent meter download and average dropping from 287 to 170 mg/dl before and after ED visit. Await repeat A1C.   Intervention: Reviewed carb counting with patient to assess understanding of which foods affect blood sugar and encouraged her to try to stay around 4- 5 servings (60-75 grams) with each meal and discouraged snacks to help her maintain her weight.   Reviewed symptoms and treatment of hypoglycemia with patient Coordination of care- discussed CBGs and insulin with physician who adjusted mealtimes insulin to 10-12 units with meals.  Patient may do better with decreased levemir dose to target her fasting blood sugar > 90 mg/dl. Also may want to consider a diabetes medicine to cover meals that does not have side effect of weight gain and low blood sugar like the DPP-4 inhibitors Teaching Method Utilized: Visual, Auditory,Hands on Handouts given during visit include:log book to record ONLY her insulin doses.   Have requested  a meter for her to record her insulin doses in.  Barriers to learning/adherence to lifestyle change: fear of low blood sugar, limited understanding of importance, forgetfulness, complexity of insulin regemin and lack of understanding of how to keep carbs consistent Demonstrated degree of understanding via:  Teach Back   Monitoring/Evaluation: Dietary intake, blood sugars, insulin timing and amounts and weight in  2 weeks.

## 2013-09-20 NOTE — Patient Instructions (Addendum)
Please take your NOVOLOG before you eat.  Write down how many units of Novolog you take and when you take it in the  book provided to today.   Take 10 - 12 units Novolog before meals depending yon your sugars and meal size.   Please make an appointment with me in 2 weeks.   Call me in the meantime if you have questions or concerns- [phone].

## 2013-09-21 ENCOUNTER — Telehealth: Payer: Self-pay | Admitting: Dietician

## 2013-09-21 MED ORDER — INSULIN ASPART 100 UNIT/ML FLEXPEN: PEN_INJECTOR | SUBCUTANEOUS | Status: DC

## 2013-09-21 NOTE — Telephone Encounter (Signed)
Patient is here today with her daughter and reports that she had a black out spell this am, when she could check her sugar it was 71. She ate and waited a bit prior to taking 10 units Novolog and CBG was 185 a. Last night took 50 units levemir, forgot her Novolog with dinner because she was out and thus had high blood sugar prior to bed of 280 mg/dl. This is her 4th fasting blood sugar this week < 90 mg/dl. Paged and spoke to Dr. Dareen Piano: her wants her to decrease her Levemir to 45 units starting tonight. Patient informed and also encouraged to call the office if she has any more low blood sugars

## 2013-09-21 NOTE — Telephone Encounter (Signed)
Thank you [name]. I agree with reducing Levemir to 45 units

## 2013-09-22 MED ORDER — INSULIN DETEMIR 100 UNIT/ML FLEXPEN: 45.0000 [IU] | PEN_INJECTOR | Freq: Every day | SUBCUTANEOUS | Status: DC

## 2013-10-05 ENCOUNTER — Encounter: Payer: Medicare Other | Admitting: Dietician

## 2013-10-05 ENCOUNTER — Other Ambulatory Visit: Payer: Self-pay | Admitting: Internal Medicine

## 2013-10-07 ENCOUNTER — Other Ambulatory Visit: Payer: Self-pay | Admitting: Internal Medicine

## 2013-10-21 ENCOUNTER — Ambulatory Visit (INDEPENDENT_AMBULATORY_CARE_PROVIDER_SITE_OTHER): Payer: Medicare Other | Admitting: *Deleted

## 2013-10-21 ENCOUNTER — Other Ambulatory Visit: Payer: Self-pay | Admitting: *Deleted

## 2013-10-21 ENCOUNTER — Other Ambulatory Visit (INDEPENDENT_AMBULATORY_CARE_PROVIDER_SITE_OTHER): Payer: Medicare Other

## 2013-10-21 DIAGNOSIS — Z23 Encounter for immunization: Secondary | ICD-10-CM | POA: Diagnosis not present

## 2013-10-21 DIAGNOSIS — E039 Hypothyroidism, unspecified: Secondary | ICD-10-CM | POA: Diagnosis not present

## 2013-10-21 MED ORDER — INSULIN PEN NEEDLE 32G X 4 MM MISC: Status: DC

## 2013-10-21 NOTE — Addendum Note (Signed)
Addended byJuluis Mire on: 10/21/2013 06:25 AM   Modules accepted: Orders

## 2013-10-21 NOTE — Telephone Encounter (Signed)
Pt wants to change pen needles to this brand.

## 2013-10-22 LAB — TSH: TSH: 6.737 u[IU]/mL — ABNORMAL HIGH (ref 0.350–4.500)

## 2013-10-22 LAB — T4, FREE: Free T4: 0.9 ng/dL (ref 0.80–1.80)

## 2013-10-22 MED ORDER — INSULIN DETEMIR 100 UNIT/ML FLEXPEN: 45.0000 [IU] | PEN_INJECTOR | Freq: Every day | SUBCUTANEOUS | Status: DC

## 2013-10-26 ENCOUNTER — Other Ambulatory Visit: Payer: Self-pay | Admitting: *Deleted

## 2013-10-26 MED ORDER — INSULIN DETEMIR 100 UNIT/ML FLEXPEN: 45.0000 [IU] | PEN_INJECTOR | Freq: Every day | SUBCUTANEOUS | Status: DC

## 2013-10-26 NOTE — Telephone Encounter (Signed)
Please resend

## 2013-10-28 NOTE — Telephone Encounter (Signed)
Rx called in 

## 2013-11-04 ENCOUNTER — Ambulatory Visit: Payer: Medicare Other | Admitting: Internal Medicine

## 2013-11-04 ENCOUNTER — Encounter: Payer: Self-pay | Admitting: Internal Medicine

## 2013-11-06 ENCOUNTER — Other Ambulatory Visit: Payer: Self-pay | Admitting: Sports Medicine

## 2013-11-15 ENCOUNTER — Other Ambulatory Visit: Payer: Self-pay | Admitting: Pharmacist

## 2013-11-15 DIAGNOSIS — E039 Hypothyroidism, unspecified: Secondary | ICD-10-CM

## 2013-11-15 MED ORDER — LEVOTHYROXINE SODIUM 75 MCG PO TABS: 75.0000 ug | ORAL_TABLET | Freq: Every day | ORAL | Status: DC

## 2013-11-16 NOTE — Addendum Note (Signed)
Addended by: Forde Dandy on: 11/16/2013 10:06 AM   Modules accepted: Orders

## 2013-12-06 ENCOUNTER — Other Ambulatory Visit: Payer: Self-pay | Admitting: Sports Medicine

## 2013-12-06 ENCOUNTER — Other Ambulatory Visit: Payer: Self-pay | Admitting: Internal Medicine

## 2013-12-09 ENCOUNTER — Encounter: Payer: Self-pay | Admitting: Internal Medicine

## 2013-12-09 ENCOUNTER — Encounter: Payer: Medicare Other | Admitting: Internal Medicine

## 2013-12-09 ENCOUNTER — Ambulatory Visit (INDEPENDENT_AMBULATORY_CARE_PROVIDER_SITE_OTHER): Payer: Medicare Other | Admitting: Internal Medicine

## 2013-12-09 VITALS — BP 149/70 | HR 92 | Temp 98.1°F | Ht 63.5 in | Wt 157.4 lb

## 2013-12-09 DIAGNOSIS — I10 Essential (primary) hypertension: Secondary | ICD-10-CM

## 2013-12-09 DIAGNOSIS — E039 Hypothyroidism, unspecified: Secondary | ICD-10-CM

## 2013-12-09 DIAGNOSIS — N183 Chronic kidney disease, stage 3 (moderate): Secondary | ICD-10-CM | POA: Diagnosis not present

## 2013-12-09 DIAGNOSIS — E1122 Type 2 diabetes mellitus with diabetic chronic kidney disease: Secondary | ICD-10-CM | POA: Diagnosis not present

## 2013-12-09 DIAGNOSIS — I890 Lymphedema, not elsewhere classified: Secondary | ICD-10-CM | POA: Insufficient documentation

## 2013-12-09 DIAGNOSIS — E1140 Type 2 diabetes mellitus with diabetic neuropathy, unspecified: Secondary | ICD-10-CM | POA: Diagnosis not present

## 2013-12-09 DIAGNOSIS — E1129 Type 2 diabetes mellitus with other diabetic kidney complication: Secondary | ICD-10-CM | POA: Diagnosis not present

## 2013-12-09 DIAGNOSIS — E1149 Type 2 diabetes mellitus with other diabetic neurological complication: Secondary | ICD-10-CM | POA: Diagnosis not present

## 2013-12-09 DIAGNOSIS — Z23 Encounter for immunization: Secondary | ICD-10-CM

## 2013-12-09 DIAGNOSIS — B351 Tinea unguium: Secondary | ICD-10-CM

## 2013-12-09 DIAGNOSIS — Z0000 Encounter for general adult medical examination without abnormal findings: Secondary | ICD-10-CM

## 2013-12-09 LAB — COMPLETE METABOLIC PANEL WITH GFR
ALT: <8 U/L (ref 0–35)
AST: 16 U/L (ref 0–37)
Albumin: 4.1 g/dL (ref 3.5–5.2)
Alkaline Phosphatase: 59 U/L (ref 39–117)
BUN: 17 mg/dL (ref 6–23)
CO2: 26 mEq/L (ref 19–32)
Calcium: 9.7 mg/dL (ref 8.4–10.5)
Chloride: 104 mEq/L (ref 96–112)
Creat: 0.93 mg/dL (ref 0.50–1.10)
GFR, Est African American: 76 mL/min
GFR, Est Non African American: 66 mL/min
Glucose, Bld: 172 mg/dL — ABNORMAL HIGH (ref 70–99)
Potassium: 4.1 mEq/L (ref 3.5–5.3)
Sodium: 140 mEq/L (ref 135–145)
Total Bilirubin: 0.3 mg/dL (ref 0.2–1.2)
Total Protein: 6.9 g/dL (ref 6.0–8.3)

## 2013-12-09 LAB — GLUCOSE, CAPILLARY: Glucose-Capillary: 174 mg/dL — ABNORMAL HIGH (ref 70–99)

## 2013-12-09 LAB — TSH: TSH: 1.153 u[IU]/mL (ref 0.350–4.500)

## 2013-12-09 LAB — POCT GLYCOSYLATED HEMOGLOBIN (HGB A1C): Hemoglobin A1C: 9.7

## 2013-12-09 MED ORDER — ZOSTER VACCINE LIVE 19400 UNT/0.65ML ~~LOC~~ SOLR: 0.6500 mL | Freq: Once | SUBCUTANEOUS | Status: DC

## 2013-12-09 NOTE — Progress Notes (Signed)
   Subjective:    Patient ID: [name], female    DOB: 1950/05/22, 63 y.o.   MRN: [id number]  HPI Pt presents for routine follow up. She feels well but states she is under a lot of stress at home as her daughter was diagnosed with breast cancer. She has not been following her diabetic diet or taking her insulin as regularly as she wants to as she has been in the hospital with her daughter but now will be more compliant as her daughter is now discharged. She does complain of mild forgetfulness and states that she feels her memory is not as goo as it used to be No other complaints   Review of Systems  Constitutional: Negative.   HENT: Negative.   Eyes: Negative.   Respiratory: Negative.   Cardiovascular: Negative.   Gastrointestinal: Negative.   Endocrine: Negative.   Genitourinary: Negative.   Musculoskeletal: Negative.   Skin: Negative.   Neurological: Negative.        Objective:   Physical Exam  Constitutional: She is oriented to person, place, and time. She appears well-developed and well-nourished.  HENT:  Head: Normocephalic and atraumatic.  Eyes: Conjunctivae are normal.  Neck: Normal range of motion.  Cardiovascular: Normal rate and regular rhythm.   Pulmonary/Chest: Effort normal and breath sounds normal.  Abdominal: Soft. Bowel sounds are normal. She exhibits no distension. There is no tenderness.  Musculoskeletal: Normal range of motion. She exhibits edema.  Pt with  Mild R UE edema which is chronic but mildly worse over the past month  Neurological: She is alert and oriented to person, place, and time.  Skin: Skin is warm and dry.  Psychiatric: She has a normal mood and affect. Her behavior is normal.          Assessment & Plan:  Please see problem based charting for assessment and plan:

## 2013-12-09 NOTE — Assessment & Plan Note (Addendum)
-   Pt with CKD - Will recheck BMP today . Last urine albumin/creatinine ration was wnl - Will start ACE-I on next visit if creatinine remains stable

## 2013-12-09 NOTE — Assessment & Plan Note (Signed)
-   Pt with mildly worsening R UE lymphedema - She wants referral back to the lymphedema centre which she went to last month - Will send referral today - Discussed with physical therapist at the center as well

## 2013-12-09 NOTE — Assessment & Plan Note (Signed)
BP Readings from Last 3 Encounters:  12/09/13 149/70  09/12/13 137/81  09/06/13 122/77    Lab Results  Component Value Date   NA 139 09/12/2013   K 3.4* 09/12/2013   CREATININE 0.90 09/12/2013    Assessment: Blood pressure control:  fair Progress toward BP goal:   mildly deteriorated Comments: pt under increased stress and may be reason for mildly elevated BP  Plan: Medications:  continue current medications Educational resources provided: brochure (has information on) Self management tools provided:   Other plans:

## 2013-12-09 NOTE — Assessment & Plan Note (Signed)
-   Pt's synthroid dose was increased to 75 micrograms in September - Will recheck TSH today and adjust dose accordingly - Pt remains asymptomatic with no complaints currently

## 2013-12-09 NOTE — Assessment & Plan Note (Signed)
-   Pt was started on terbinafine wiith only mild improvement - Pt to follow up with podiatry for nail care and for persistent fungal infection - Will check liver profile as pt is on terbinafine

## 2013-12-09 NOTE — Assessment & Plan Note (Signed)
-   pt received tetanus vaccine today - She also received her flu shot this year - She was also given a prescription for the zoster vaccine

## 2013-12-09 NOTE — Patient Instructions (Addendum)
- It was a pleasure meeting you again - We will recheck your thyroid function today - Your A1C has improved but we will continue to work on your diet  - Please follow up in 1 month - We will check your kidney function today - I will refer you to podiatry secondary to the persistent fungal infection in your nails - i will also refer you to the lymphedema center  Diabetes Mellitus and Food It is important for you to manage your blood sugar (glucose) level. Your blood glucose level can be greatly affected by what you eat. Eating healthier foods in the appropriate amounts throughout the day at about the same time each day will help you control your blood glucose level. It can also help slow or prevent worsening of your diabetes mellitus. Healthy eating may even help you improve the level of your blood pressure and reach or maintain a healthy weight.  HOW CAN FOOD AFFECT ME? Carbohydrates Carbohydrates affect your blood glucose level more than any other type of food. Your dietitian will help you determine how many carbohydrates to eat at each meal and teach you how to count carbohydrates. Counting carbohydrates is important to keep your blood glucose at a healthy level, especially if you are using insulin or taking certain medicines for diabetes mellitus. Alcohol Alcohol can cause sudden decreases in blood glucose (hypoglycemia), especially if you use insulin or take certain medicines for diabetes mellitus. Hypoglycemia can be a life-threatening condition. Symptoms of hypoglycemia (sleepiness, dizziness, and disorientation) are similar to symptoms of having too much alcohol.  If your health care provider has given you approval to drink alcohol, do so in moderation and use the following guidelines:  Women should not have more than one drink per day, and men should not have more than two drinks per day. One drink is equal to:  12 oz of beer.  5 oz of wine.  1 oz of hard liquor.  Do not drink on an  empty stomach.  Keep yourself hydrated. Have water, diet soda, or unsweetened iced tea.  Regular soda, juice, and other mixers might contain a lot of carbohydrates and should be counted. WHAT FOODS ARE NOT RECOMMENDED? As you make food choices, it is important to remember that all foods are not the same. Some foods have fewer nutrients per serving than other foods, even though they might have the same number of calories or carbohydrates. It is difficult to get your body what it needs when you eat foods with fewer nutrients. Examples of foods that you should avoid that are high in calories and carbohydrates but low in nutrients include:  Trans fats (most processed foods list trans fats on the Nutrition Facts label).  Regular soda.  Juice.  Candy.  Sweets, such as cake, pie, doughnuts, and cookies.  Fried foods. WHAT FOODS CAN I EAT? Have nutrient-rich foods, which will nourish your body and keep you healthy. The food you should eat also will depend on several factors, including:  The calories you need.  The medicines you take.  Your weight.  Your blood glucose level.  Your blood pressure level.  Your cholesterol level. You also should eat a variety of foods, including:  Protein, such as meat, poultry, fish, tofu, nuts, and seeds (lean animal proteins are best).  Fruits.  Vegetables.  Dairy products, such as milk, cheese, and yogurt (low fat is best).  Breads, grains, pasta, cereal, rice, and beans.  Fats such as olive oil, trans fat-free margarine,  canola oil, avocado, and olives. DOES EVERYONE WITH DIABETES MELLITUS HAVE THE SAME MEAL PLAN? Because every person with diabetes mellitus is different, there is not one meal plan that works for everyone. It is very important that you meet with a dietitian who will help you create a meal plan that is just right for you. Document Released: 10/11/2004 Document Revised: 01/19/2013 Document Reviewed: 12/11/2012 [facility] Patient  Information 2015 [place], [place]. This information is not intended to replace advice given to you by your health care provider. Make sure you discuss any questions you have with your health care provider.

## 2013-12-09 NOTE — Assessment & Plan Note (Signed)
Lab Results  Component Value Date   HGBA1C 9.7 12/09/2013   HGBA1C 11.1 07/20/2013   HGBA1C 9.5 04/09/2013     Assessment: Diabetes control:  fair Progress toward A1C goal:   improved Comments: pt with some non compliance with diet and meds secondary to recent family events.   Plan: Medications:  continue current medications Home glucose monitoring: Frequency:   Timing:   Instruction/counseling given: reminded to bring blood glucose meter & log to each visit, discussed foot care and discussed diet Educational resources provided: brochure (has information on) Self management tools provided: copy of home glucose meter download, home glucose logbook Other plans:

## 2013-12-10 LAB — MICROALBUMIN / CREATININE URINE RATIO
Creatinine, Urine: 111.3 mg/dL
Microalb Creat Ratio: 9 mg/g (ref 0.0–30.0)
Microalb, Ur: 1 mg/dL (ref <2.0)

## 2013-12-10 NOTE — Addendum Note (Signed)
Addended by: Forde Dandy on: 12/10/2013 10:29 AM   Modules accepted: Orders, Medications

## 2013-12-12 ENCOUNTER — Other Ambulatory Visit: Payer: Self-pay | Admitting: Internal Medicine

## 2013-12-19 ENCOUNTER — Encounter (HOSPITAL_COMMUNITY): Payer: Self-pay

## 2013-12-19 ENCOUNTER — Emergency Department (HOSPITAL_COMMUNITY)
Admission: EM | Admit: 2013-12-19 | Discharge: 2013-12-19 | Disposition: A | Discharge status: Home / Self Care | Payer: No Typology Code available for payment source | Attending: Emergency Medicine | Admitting: Emergency Medicine

## 2013-12-19 ENCOUNTER — Emergency Department (HOSPITAL_COMMUNITY): Payer: No Typology Code available for payment source

## 2013-12-19 DIAGNOSIS — H409 Unspecified glaucoma: Secondary | ICD-10-CM | POA: Diagnosis not present

## 2013-12-19 DIAGNOSIS — Y92410 Unspecified street and highway as the place of occurrence of the external cause: Secondary | ICD-10-CM | POA: Diagnosis not present

## 2013-12-19 DIAGNOSIS — E119 Type 2 diabetes mellitus without complications: Secondary | ICD-10-CM | POA: Diagnosis not present

## 2013-12-19 DIAGNOSIS — Y998 Other external cause status: Secondary | ICD-10-CM | POA: Diagnosis not present

## 2013-12-19 DIAGNOSIS — N189 Chronic kidney disease, unspecified: Secondary | ICD-10-CM | POA: Diagnosis not present

## 2013-12-19 DIAGNOSIS — S4992XA Unspecified injury of left shoulder and upper arm, initial encounter: Secondary | ICD-10-CM | POA: Insufficient documentation

## 2013-12-19 DIAGNOSIS — S3992XA Unspecified injury of lower back, initial encounter: Secondary | ICD-10-CM | POA: Diagnosis not present

## 2013-12-19 DIAGNOSIS — Z880 Allergy status to penicillin: Secondary | ICD-10-CM | POA: Diagnosis not present

## 2013-12-19 DIAGNOSIS — Y9389 Activity, other specified: Secondary | ICD-10-CM | POA: Insufficient documentation

## 2013-12-19 DIAGNOSIS — Z862 Personal history of diseases of the blood and blood-forming organs and certain disorders involving the immune mechanism: Secondary | ICD-10-CM | POA: Diagnosis not present

## 2013-12-19 DIAGNOSIS — S299XXA Unspecified injury of thorax, initial encounter: Secondary | ICD-10-CM | POA: Insufficient documentation

## 2013-12-19 DIAGNOSIS — I129 Hypertensive chronic kidney disease with stage 1 through stage 4 chronic kidney disease, or unspecified chronic kidney disease: Secondary | ICD-10-CM | POA: Insufficient documentation

## 2013-12-19 DIAGNOSIS — F329 Major depressive disorder, single episode, unspecified: Secondary | ICD-10-CM | POA: Insufficient documentation

## 2013-12-19 DIAGNOSIS — M1990 Unspecified osteoarthritis, unspecified site: Secondary | ICD-10-CM | POA: Insufficient documentation

## 2013-12-19 DIAGNOSIS — Z9849 Cataract extraction status, unspecified eye: Secondary | ICD-10-CM | POA: Insufficient documentation

## 2013-12-19 DIAGNOSIS — M545 Low back pain: Secondary | ICD-10-CM | POA: Diagnosis not present

## 2013-12-19 DIAGNOSIS — Z043 Encounter for examination and observation following other accident: Secondary | ICD-10-CM | POA: Diagnosis not present

## 2013-12-19 DIAGNOSIS — S199XXA Unspecified injury of neck, initial encounter: Secondary | ICD-10-CM | POA: Insufficient documentation

## 2013-12-19 DIAGNOSIS — Z8669 Personal history of other diseases of the nervous system and sense organs: Secondary | ICD-10-CM | POA: Diagnosis not present

## 2013-12-19 DIAGNOSIS — Z853 Personal history of malignant neoplasm of breast: Secondary | ICD-10-CM | POA: Diagnosis not present

## 2013-12-19 DIAGNOSIS — Z7982 Long term (current) use of aspirin: Secondary | ICD-10-CM | POA: Insufficient documentation

## 2013-12-19 DIAGNOSIS — Z794 Long term (current) use of insulin: Secondary | ICD-10-CM | POA: Insufficient documentation

## 2013-12-19 DIAGNOSIS — Z79899 Other long term (current) drug therapy: Secondary | ICD-10-CM | POA: Diagnosis not present

## 2013-12-19 DIAGNOSIS — M79602 Pain in left arm: Secondary | ICD-10-CM | POA: Diagnosis not present

## 2013-12-19 DIAGNOSIS — Z8719 Personal history of other diseases of the digestive system: Secondary | ICD-10-CM | POA: Insufficient documentation

## 2013-12-19 MED ORDER — HYDROCODONE-ACETAMINOPHEN 5-325 MG PO TABS: 1.0000 | ORAL_TABLET | ORAL | Status: DC | PRN
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
## 2013-12-19 NOTE — ED Notes (Signed)
Pt transported to xray 

## 2013-12-19 NOTE — Discharge Instructions (Signed)

## 2013-12-21 ENCOUNTER — Ambulatory Visit: Payer: Medicare Other | Attending: Internal Medicine | Admitting: Physical Therapy

## 2013-12-21 DIAGNOSIS — Z5189 Encounter for other specified aftercare: Secondary | ICD-10-CM | POA: Diagnosis not present

## 2013-12-21 DIAGNOSIS — M25611 Stiffness of right shoulder, not elsewhere classified: Secondary | ICD-10-CM | POA: Diagnosis not present

## 2013-12-21 DIAGNOSIS — M6281 Muscle weakness (generalized): Secondary | ICD-10-CM | POA: Diagnosis not present

## 2013-12-21 DIAGNOSIS — C50911 Malignant neoplasm of unspecified site of right female breast: Secondary | ICD-10-CM | POA: Insufficient documentation

## 2013-12-21 DIAGNOSIS — I972 Postmastectomy lymphedema syndrome: Secondary | ICD-10-CM | POA: Insufficient documentation

## 2013-12-21 DIAGNOSIS — I890 Lymphedema, not elsewhere classified: Secondary | ICD-10-CM

## 2013-12-21 NOTE — Therapy (Deleted)
Physical Therapy Evaluation  Patient Details  Name: [name] MRN: [id number] Date of Birth: [birth year]  Encounter Date: 12/21/2013      PT End of Session - 12/21/13 1319    Visit Number 1   Number of Visits 8   Date for PT Re-Evaluation 01/18/14   PT Start Time 0910   PT Stop Time 1000   PT Time Calculation (min) 50 min   Activity Tolerance Patient tolerated treatment well   Behavior During Therapy [facility] for tasks assessed/performed      Past Medical History  Diagnosis Date  . Allergy   . Anemia   . Depression   . Arthritis   . Cancer   . Cataract   . Diabetes mellitus without complication   . Glaucoma   . Hyperlipidemia   . Hypertension   . Chronic kidney disease   . Neuromuscular disorder   . Thyroid disease   . Left arm pain 07/28/2012  . BREAST CANCER, HX OF 04/16/2006    Qualifier: Diagnosis of  By: Leward Quan MD, Pamala Hurry    . Diverticulosis     Past Surgical History  Procedure Laterality Date  . Abdominal hysterectomy    . Breast surgery    . Eye surgery    . Tubal ligation      There were no vitals taken for this visit.  Visit Diagnosis:  Breast cancer, right - Plan: PT plan of care cert/re-cert  Post-mastectomy lymphedema syndrome - Plan: PT plan of care cert/re-cert  Lymphedema of upper extremity - Plan: PT plan of care cert/re-cert  Stiffness of right shoulder joint - Plan: PT plan of care cert/re-cert  Decreased muscle strength - Plan: PT plan of care cert/re-cert      Subjective Assessment - 12/21/13 0928    Symptoms swellining in right arm and hand that sometimes makes it difficult to use for function   Patient Stated Goals decrease the swelling in her arm and hand   Currently in Pain? No/denies  numb feeling at a level 8   Pain Location Hand  right          [facility] PT Assessment - 12/21/13 1413    Assessment   Medical Diagnosis lymphedema   Prior Therapy pt has had manual lymph drainage with compression bandaging and compression  garments in the past.  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   Living Arrangements Children   Available Help at Discharge Family   Type of [place] to enter   Entrance Stairs-Number of Steps 5   Entrance Stairs-Rails Right;Left;Can reach both   [place] One level   [place] - single point   Additional Comments she does not use the cane all the time   Prior Function   Level of Independence Independent with basic ADLs;Independent with homemaking with ambulation;Independent with gait;Independent with transfers;Requires assistive device for independence   Vocation Retired   Associate Professor   Overall Cognitive Status Within Functional Limits for tasks assessed   Observation/Other Assessments   Skin Integrity very tight tissue at [employer] site   Editor, commissioning  Impaired by gross assessment  pt reports numbness and feelings of coldness   Coordination   Gross Motor Movements are Fluid and Coordinated Yes   Fine Motor Movements are Fluid and Coordinated No  diffculty right hand including thumb opposition   Posture/Postural Control   Posture Comments rounded shoulders, thoracic kyphosis and forward head   AROM   Right Shoulder Extension 60 Degrees   Right Shoulder Flexion 108 Degrees   Right Shoulder ABduction 98 Degrees   Right Shoulder Internal Rotation 75 Degrees   Right Shoulder External Rotation 74 Degrees   Left Shoulder Extension 70 Degrees   Left Shoulder Flexion 157 Degrees   Left Shoulder ABduction 144 Degrees   Left Shoulder Internal Rotation 75 Degrees   Left Shoulder External Rotation 85 Degrees   Strength    Overall Strength Within functional limits for tasks performed  pt appears to have generalized weakness   Right Shoulder Flexion 3+/5   Right Shoulder ABduction 3+/5            PT Education - December 30, 2013 1319    Education provided Yes   Person(s) Educated Patient   Methods Explanation;Demonstration;Verbal cues;Handout   Comprehension Verbalized understanding;Returned demonstration              Plan - 2013-12-30 1325    Clinical Impression Statement [name] has had an exacerbation of her right upper extremity lymphedema and has a type of nodule on her anterior wrist that she thinks is from trauma of trying to put an IV in. She would benefit from manual lymph drainage , myofascial release and exercise to assist with lymphatic flow as well improve strength  and range of motion of riight shoulder    Pt will benefit from skilled therapeutic intervention in order to improve on the following deficits Postural dysfunction;Increased fascial restricitons;Increased edema;Decreased scar mobility;Decreased range of motion;Decreased strength   Rehab Potential Excellent   PT Frequency 2x / week   PT Duration 4 weeks   PT Treatment/Interventions Therapeutic exercise;Compression bandaging;Scar mobilization;Neuromuscular re-education;Passive range of motion;Patient/family education;Manual techniques;Manual lymph drainage   PT Next Visit Plan Manual lymph drainage to right upper quadrant with myofascial release to radiated skin at mastectomy site.  Active range of motion to neck, scapula, shoulder and elbow.  Review hand range of motion exercise.  Consider Tg soft for arm   Consulted and Agree with Plan of Care Patient          G-Codes - 12-30-2013 1321    Functional Assessment Tool Used clinical judgement   Functional Limitation Other PT primary   Other PT Primary Current Status (P9509) At least 40 percent but less than 60 percent impaired, limited or restricted   Other PT Primary Goal Status  (T2671) At least 1 percent but less than 20 percent impaired, limited or restricted      Problem List Patient Active Problem List   Diagnosis Date Noted  . Lymphedema 12/09/2013  . Onychomycosis 08/19/2013  . Diabetic neuropathy, type II diabetes mellitus 04/09/2013  . Rotator cuff tendinitis 09/09/2012  . Health care maintenance 08/26/2012  . Dysphagia 08/26/2012  . HTN, goal below 140/90 08/13/2012  . Chronic kidney disease 08/13/2012  . Subacromial bursitis 08/12/2012  . Glaucoma associated with systemic syndromes(365.44) 08/07/2012  . Left arm pain 07/28/2012  . Current non-adherence to medical treatment 06/12/2012  . Hypothyroidism 04/16/2006  . Diabetes mellitus type 2 with neurological manifestations 04/16/2006  . ANEMIA-NOS 04/16/2006  . DEPRESSION 04/16/2006  . OSTEOARTHRITIS 04/16/2006  .  LOW BACK PAIN 04/16/2006  . BREAST CANCER, HX OF 04/16/2006  . HYPERLIPIDEMIA 10/13/2003            LYMPHEDEMA/ONCOLOGY QUESTIONNAIRE - 12/21/13 1417    Type   Cancer Type breast cancer   Surgeries   Mastectomy Date 01/28/97   Axillary Lymph Node Dissection Date 01/28/97   Treatment   Past Chemotherapy Treatment Yes   Past Radiation Treatment Yes   What other symptoms do you have   Are you Having Heaviness or Tightness Yes   Are you having Pain Yes  pins and needles, numb like   Are you having pitting edema No   Is it Hard or Difficult finding clothes that fit No   Do you have infections No   Is there Decreased scar mobility Yes   Stemmer Sign No   Other Symptoms hard moveable nodule at thenar side of right ventrail wrist oval shaped ~ 1cmx2cm, moveable, nonpainful that she says has been there since August that developed after an attempt to put an IV in her arm   Right Upper Extremity Lymphedema   15 cm Proximal to Olecranon Process 35.4 cm   10 cm Proximal to Olecranon Process 34.5 cm   Olecranon Process 27.7 cm   15 cm Proximal to Ulnar Styloid Process 27.5 cm    10 cm Proximal to Ulnar Styloid Process 24 cm   Just Proximal to Ulnar Styloid Process 17.8 cm   Across Hand at [employer] 21.4 cm   At [place] of 2nd Digit 7 cm   Left Upper Extremity Lymphedema   15 cm Proximal to Olecranon Process 31.5 cm   10 cm Proximal to Olecranon Process 30 cm   Olecranon Process 24.7 cm   15 cm Proximal to Ulnar Styloid Process 24.2 cm   10 cm Proximal to Ulnar Styloid Process 21.7 cm   Just Proximal to Ulnar Styloid Process 16.8 cm   Across Hand at [employer] 20.2 cm   At [place] of 2nd Digit 6.5 cm   Other pt is right handed           Katina Dung - 12/21/13 1017    Open a tight or new jar Moderate difficulty   Do heavy household chores (wash walls, wash floors) Mild difficulty   Carry a shopping bag or briefcase Mild difficulty   Wash your back Mild difficulty   Use a knife to cut food Moderate difficulty   Recreational activities in which you take some force or impact through your arm, shoulder, or hand (golf, hammering, tennis) Mild difficulty   During the past week, to what extent has your arm, shoulder or hand problem interfered with your normal social activities with family, friends, neighbors, or groups? Modererately   During the past week, to what extent has your arm, shoulder or hand problem limited your work or other regular daily activities Slightly   Arm, shoulder, or hand pain. Moderate   Tingling (pins and needles) in your arm, shoulder, or hand Moderate   Difficulty Sleeping Mild difficulty   DASH Score 36.36 %      Norwood Levo 12/21/2013, 2:37 PM

## 2013-12-21 NOTE — Therapy (Signed)
Physical Therapy Evaluation  Patient Details  Name: [name] MRN: [id number] Date of Birth: March 05, 1950  Encounter Date: 12/21/2013      PT End of Session - 12/21/13 1319    Visit Number 1   Number of Visits 8   Date for PT Re-Evaluation 01/18/14   PT Start Time 0910   PT Stop Time 1000   PT Time Calculation (min) 50 min   Activity Tolerance Patient tolerated treatment well   Behavior During Therapy [facility] for tasks assessed/performed      Past Medical History  Diagnosis Date  . Allergy   . Anemia   . Depression   . Arthritis   . Cancer   . Cataract   . Diabetes mellitus without complication   . Glaucoma   . Hyperlipidemia   . Hypertension   . Chronic kidney disease   . Neuromuscular disorder   . Thyroid disease   . Left arm pain 07/28/2012  . BREAST CANCER, HX OF 04/16/2006    Qualifier: Diagnosis of  By: Leward Quan MD, Pamala Hurry    . Diverticulosis     Past Surgical History  Procedure Laterality Date  . Abdominal hysterectomy    . Breast surgery    . Eye surgery    . Tubal ligation      There were no vitals taken for this visit.  Visit Diagnosis:  Breast cancer, right - Plan: PT plan of care cert/re-cert  Post-mastectomy lymphedema syndrome - Plan: PT plan of care cert/re-cert  Lymphedema of upper extremity - Plan: PT plan of care cert/re-cert  Stiffness of right shoulder joint - Plan: PT plan of care cert/re-cert  Decreased muscle strength - Plan: PT plan of care cert/re-cert      Subjective Assessment - 12/21/13 0928    Symptoms swellining in right arm and hand that sometimes makes it difficult to use for function   Patient Stated Goals decrease the swelling in her arm and hand   Currently in Pain? No/denies  numb feeling at a level 8   Pain Location Hand  right          [facility] PT Assessment - 12/21/13 1413    Assessment   Medical Diagnosis lymphedema   Prior Therapy pt has had manual lymph drainage with compression bandaging and compression  garments in the past.  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   Living Arrangements Children   Available Help at Discharge Family   Type of [place] to enter   Entrance Stairs-Number of Steps 5   Entrance Stairs-Rails Right;Left;Can reach both   [place] One level   [place] - single point   Additional Comments she does not use the cane all the time   Prior Function   Level of Independence Independent with basic ADLs;Independent with homemaking with ambulation;Independent with gait;Independent with transfers;Requires assistive device for independence   Vocation Retired   Associate Professor   Overall Cognitive Status Within Functional Limits for tasks assessed   Observation/Other Assessments   Skin Integrity very tight tissue at [employer] site   Editor, commissioning  Impaired by gross assessment  pt reports numbness and feelings of coldness   Coordination   Gross Motor Movements are Fluid and Coordinated Yes   Fine Motor Movements are Fluid and Coordinated No  diffculty right hand including thumb opposition   Posture/Postural Control   Posture Comments rounded shoulders, thoracic kyphosis and forward head   AROM   Right Shoulder Extension 60 Degrees   Right Shoulder Flexion 108 Degrees   Right Shoulder ABduction 98 Degrees   Right Shoulder Internal Rotation 75 Degrees   Right Shoulder External Rotation 74 Degrees   Left Shoulder Extension 70 Degrees   Left Shoulder Flexion 157 Degrees   Left Shoulder ABduction 144 Degrees   Left Shoulder Internal Rotation 75 Degrees   Left Shoulder External Rotation 85 Degrees   Strength    Overall Strength Within functional limits for tasks performed  pt appears to have generalized weakness   Right Shoulder Flexion 3+/5   Right Shoulder ABduction 3+/5            PT Education - 2014-01-04 1319    Education provided Yes   Person(s) Educated Patient   Methods Explanation;Demonstration;Verbal cues;Handout   Comprehension Verbalized understanding;Returned demonstration              Plan - January 04, 2014 1325    Clinical Impression Statement [name] has had an exacerbation of her right upper extremity lymphedema and has a type of nodule on her anterior wrist that she thinks is from trauma of trying to put an IV in. She would benefit from manual lymph drainage , myofascial release and exercise to assist with lymphatic flow as well improve strength  and range of motion of riight shoulder    Pt will benefit from skilled therapeutic intervention in order to improve on the following deficits Postural dysfunction;Increased fascial restricitons;Increased edema;Decreased scar mobility;Decreased range of motion;Decreased strength   Rehab Potential Excellent   PT Frequency 2x / week   PT Duration 4 weeks   PT Treatment/Interventions Therapeutic exercise;Compression bandaging;Scar mobilization;Neuromuscular re-education;Passive range of motion;Patient/family education;Manual techniques;Manual lymph drainage   PT Next Visit Plan Manual lymph drainage to right upper quadrant with myofascial release to radiated skin at mastectomy site.  Active range of motion to neck, scapula, shoulder and elbow.  Review hand range of motion exercise.  Consider Tg soft for arm   Consulted and Agree with Plan of Care Patient          G-Codes - 01-04-2014 1321    Functional Assessment Tool Used clinical judgement   Functional Limitation Other PT primary   Other PT Primary Current Status (Q6834) At least 40 percent but less than 60 percent impaired, limited or restricted   Other PT Primary Goal Status  (H9622) At least 1 percent but less than 20 percent impaired, limited or restricted      Problem List Patient Active Problem List   Diagnosis Date Noted  . Lymphedema 12/09/2013  . Onychomycosis 08/19/2013  . Diabetic neuropathy, type II diabetes mellitus 04/09/2013  . Rotator cuff tendinitis 09/09/2012  . Health care maintenance 08/26/2012  . Dysphagia 08/26/2012  . HTN, goal below 140/90 08/13/2012  . Chronic kidney disease 08/13/2012  . Subacromial bursitis 08/12/2012  . Glaucoma associated with systemic syndromes(365.44) 08/07/2012  . Left arm pain 07/28/2012  . Current non-adherence to medical treatment 06/12/2012  . Hypothyroidism 04/16/2006  . Diabetes mellitus type 2 with neurological manifestations 04/16/2006  . ANEMIA-NOS 04/16/2006  . DEPRESSION 04/16/2006  . OSTEOARTHRITIS 04/16/2006  .  LOW BACK PAIN 04/16/2006  . BREAST CANCER, HX OF 04/16/2006  . HYPERLIPIDEMIA 10/13/2003            LYMPHEDEMA/ONCOLOGY QUESTIONNAIRE - 12/21/13 1417    Type   Cancer Type breast cancer   Surgeries   Mastectomy Date 01/28/97   Axillary Lymph Node Dissection Date 01/28/97   Treatment   Past Chemotherapy Treatment Yes   Past Radiation Treatment Yes   What other symptoms do you have   Are you Having Heaviness or Tightness Yes   Are you having Pain Yes  pins and needles, numb like   Are you having pitting edema No   Is it Hard or Difficult finding clothes that fit No   Do you have infections No   Is there Decreased scar mobility Yes   Stemmer Sign No   Other Symptoms hard moveable nodule at thenar side of right ventrail wrist oval shaped ~ 1cmx2cm, moveable, nonpainful that she says has been there since August that developed after an attempt to put an IV in her arm   Right Upper Extremity Lymphedema   15 cm Proximal to Olecranon Process 35.4 cm   10 cm Proximal to Olecranon Process 34.5 cm   Olecranon Process 27.7 cm   15 cm Proximal to Ulnar Styloid Process 27.5 cm    10 cm Proximal to Ulnar Styloid Process 24 cm   Just Proximal to Ulnar Styloid Process 17.8 cm   Across Hand at [employer] 21.4 cm   At [place] of 2nd Digit 7 cm   Left Upper Extremity Lymphedema   15 cm Proximal to Olecranon Process 31.5 cm   10 cm Proximal to Olecranon Process 30 cm   Olecranon Process 24.7 cm   15 cm Proximal to Ulnar Styloid Process 24.2 cm   10 cm Proximal to Ulnar Styloid Process 21.7 cm   Just Proximal to Ulnar Styloid Process 16.8 cm   Across Hand at [employer] 20.2 cm   At [place] of 2nd Digit 6.5 cm   Other pt is right handed                          Katina Dung - 12/21/13 1017    Open a tight or new jar Moderate difficulty   Do heavy household chores (wash walls, wash floors) Mild difficulty   Carry a shopping bag or briefcase Mild difficulty   Wash your back Mild difficulty   Use a knife to cut food Moderate difficulty   Recreational activities in which you take some force or impact through your arm, shoulder, or hand (golf, hammering, tennis) Mild difficulty   During the past week, to what extent has your arm, shoulder or hand problem interfered with your normal social activities with family, friends, neighbors, or groups? Modererately   During the past week, to what extent has your arm, shoulder or hand problem limited your work or other regular daily activities Slightly   Arm, shoulder, or hand pain. Moderate   Tingling (pins and needles) in your arm, shoulder, or hand Moderate   Difficulty Sleeping Mild difficulty   DASH Score 36.36 %           Short Term Clinic Goals - 12/21/13 1418    CC Short Term Goal  #1   Title pt will verbalize and be able to perform self manual lymph drainage for her right arm   Time 3   Period Weeks  Status New   CC Short Term Goal  #2   Title pt will be able to do a home exercise program for increased lymphatic flow   Time 3   Period Weeks   Status New   CC Short Term Goal  #3   Title pt  will have decrease in cicumferential measurement of .5 cm at across right  hand at thumb webspace   Time 3   Period Weeks          Long Term Clinic Goals - 12/21/13 1423    CC Long Term Goal  #1   Title patient will report she knows how to manage her arm lymphedema at home   Time 6   Period Weeks   Status New   CC Long Term Goal  #2   Title pt will have reduction of 1.0 cm circumferential measurement at 15cm proximal to the right ulnar styloid   Time 6   Period Weeks   Status New   CC Long Term Goal  #3   Title  pt will have 120 degrees of active right shoulder flexion to have easier activities of daily liviing   Time 6   Period Weeks   Status New     Teresa K. Owens Shark, PT 12/21/2013, 2:39 PM

## 2013-12-21 NOTE — Patient Instructions (Addendum)
FINGERS: Spread   Spread fingers and thumb of involved hand apart like a fan and then bring together. Repeat ___ times. Do ___ times per day.  Copyright  VHI. All rights reserved.  FINGERS: Opposition   Touch thumb pad to pad of each finger. Return, moving from small to index finger. Repeat ___ times. Do ___ times per day.  Copyright  VHI. All rights reserved.    FINGERS: Table Top   Keeping fingers straight, bend knuckles to 90, (flat as a table top). Repeat _5__ times. Do 5___ times per day.      Copyright  VHI. All rights reserved.  Diaphragmatic - Sitting or Standing   Inhale through nose making navel move out toward hands. Exhale through puckered lips, hands follow navel in. Repeat _5__ times. Rest __5_ seconds between repeats. Do _5__ times per day.  Copyright  VHI. All rights reserved.  Diaphragmatic - Sitting or Standing   Try to keep your hand elevated while exercising

## 2013-12-27 ENCOUNTER — Ambulatory Visit: Payer: Medicare Other | Admitting: Physical Therapy

## 2013-12-27 DIAGNOSIS — M6281 Muscle weakness (generalized): Secondary | ICD-10-CM | POA: Diagnosis not present

## 2013-12-27 DIAGNOSIS — M25611 Stiffness of right shoulder, not elsewhere classified: Secondary | ICD-10-CM

## 2013-12-27 DIAGNOSIS — I972 Postmastectomy lymphedema syndrome: Secondary | ICD-10-CM | POA: Diagnosis not present

## 2013-12-27 DIAGNOSIS — C50911 Malignant neoplasm of unspecified site of right female breast: Secondary | ICD-10-CM | POA: Diagnosis not present

## 2013-12-27 DIAGNOSIS — Z5189 Encounter for other specified aftercare: Secondary | ICD-10-CM | POA: Diagnosis not present

## 2013-12-27 DIAGNOSIS — I890 Lymphedema, not elsewhere classified: Secondary | ICD-10-CM | POA: Diagnosis not present

## 2013-12-27 NOTE — Therapy (Signed)
Physical Therapy Treatment  Patient Details  Name: [name] MRN: [id number] Date of Birth: 1950/04/20  Encounter Date: 12/27/2013      PT End of Session - 12/27/13 1317    Visit Number 2   Number of Visits 8   Date for PT Re-Evaluation 01/18/14   PT Start Time 0850   PT Stop Time 0932   PT Time Calculation (min) 42 min      Past Medical History  Diagnosis Date  . Allergy   . Anemia   . Depression   . Arthritis   . Cancer   . Cataract   . Diabetes mellitus without complication   . Glaucoma   . Hyperlipidemia   . Hypertension   . Chronic kidney disease   . Neuromuscular disorder   . Thyroid disease   . Left arm pain 07/28/2012  . BREAST CANCER, HX OF 04/16/2006    Qualifier: Diagnosis of  By: Leward Quan MD, Pamala Hurry    . Diverticulosis     Past Surgical History  Procedure Laterality Date  . Abdominal hysterectomy    . Breast surgery    . Eye surgery    . Tubal ligation      There were no vitals taken for this visit.  Visit Diagnosis:  Post-mastectomy lymphedema syndrome  Stiffness of right shoulder joint      Subjective Assessment - 12/27/13 0854    Symptoms Feeling "bleah" today.  Lost her instructions from last therapy visit and was worried about that.  No pain--just feeling tight.   Currently in Pain? No/denies   Pain Score 0-No pain   Pain Location Hand  right   Pain Type --  tight            [facility] Adult PT Treatment/Exercise - 12/27/13 0001    Exercises   Exercises --  quick review of hand exercise HEP from last session   Manual Therapy   Manual Therapy Myofascial release;Manual Lymphatic Drainage (MLD)   Myofascial Release Gentle soft tissue mobilization at right chest; myofascial crosshands technique horizontal, vertical, and diagonal at chest (right); right UE myofascial pulling   Manual Lymphatic Drainage (MLD) In supine:  short neck, superficial and deep abdomen, left axilla and anterior interaxillary anastomosis, right groin and  axillo-inguinal anastomosis, and right UE from fingers to shoulder.          PT Education - 12/27/13 1316    Education provided Yes   Education Details Reprinted and reviewed hand exercises given last session, as patient had lost those instructions.   Person(s) Educated Patient   Methods Explanation;Demonstration;Verbal cues;Handout   Comprehension Verbalized understanding              Plan - 12/27/13 1318    Clinical Impression Statement Soft tissue of right chest is very tight and adhered to her chest wall; pt. should benefit from myofascial release.  Pt. may see her doctor about lump at right wrist.        Problem List Patient Active Problem List   Diagnosis Date Noted  . Lymphedema 12/09/2013  . Onychomycosis 08/19/2013  . Diabetic neuropathy, type II diabetes mellitus 04/09/2013  . Rotator cuff tendinitis 09/09/2012  . Health care maintenance 08/26/2012  . Dysphagia 08/26/2012  . HTN, goal below 140/90 08/13/2012  . Chronic kidney disease 08/13/2012  . Subacromial bursitis 08/12/2012  . Glaucoma associated with systemic syndromes(365.44) 08/07/2012  . Left arm pain 07/28/2012  . Current non-adherence to medical treatment 06/12/2012  . Hypothyroidism 04/16/2006  .  Diabetes mellitus type 2 with neurological manifestations 04/16/2006  . ANEMIA-NOS 04/16/2006  . DEPRESSION 04/16/2006  . OSTEOARTHRITIS 04/16/2006  . LOW BACK PAIN 04/16/2006  . BREAST CANCER, HX OF 04/16/2006  . HYPERLIPIDEMIA 10/13/2003                                           Wilson, PT   [place] 12/27/2013, 1:21 PM

## 2013-12-29 ENCOUNTER — Ambulatory Visit: Payer: Medicare Other | Attending: Internal Medicine | Admitting: Physical Therapy

## 2013-12-29 DIAGNOSIS — M6281 Muscle weakness (generalized): Secondary | ICD-10-CM

## 2013-12-29 DIAGNOSIS — C50911 Malignant neoplasm of unspecified site of right female breast: Secondary | ICD-10-CM | POA: Diagnosis not present

## 2013-12-29 DIAGNOSIS — M25611 Stiffness of right shoulder, not elsewhere classified: Secondary | ICD-10-CM

## 2013-12-29 DIAGNOSIS — I972 Postmastectomy lymphedema syndrome: Secondary | ICD-10-CM | POA: Diagnosis not present

## 2013-12-29 DIAGNOSIS — Z5189 Encounter for other specified aftercare: Secondary | ICD-10-CM | POA: Diagnosis not present

## 2013-12-29 DIAGNOSIS — I890 Lymphedema, not elsewhere classified: Secondary | ICD-10-CM | POA: Diagnosis not present

## 2013-12-29 NOTE — Therapy (Signed)
[place], [place], [address] Phone: [phone]   Fax:  [phone]  Physical Therapy Treatment  Patient Details  Name: [name] MRN: [id number] Date of Birth: 12/17/1950  Encounter Date: 12/29/2013      PT End of Session - 12/29/13 1246    Visit Number 3   Number of Visits 8   Date for PT Re-Evaluation 01/18/14   PT Start Time 0933   PT Stop Time 1013   PT Time Calculation (min) 40 min   Activity Tolerance Patient tolerated treatment well   Behavior During Therapy [facility] for tasks assessed/performed      Past Medical History  Diagnosis Date  . Allergy   . Anemia   . Depression   . Arthritis   . Cancer   . Cataract   . Diabetes mellitus without complication   . Glaucoma   . Hyperlipidemia   . Hypertension   . Chronic kidney disease   . Neuromuscular disorder   . Thyroid disease   . Left arm pain 07/28/2012  . BREAST CANCER, HX OF 04/16/2006    Qualifier: Diagnosis of  By: Leward Quan MD, Pamala Hurry    . Diverticulosis     Past Surgical History  Procedure Laterality Date  . Abdominal hysterectomy    . Breast surgery    . Eye surgery    . Tubal ligation      There were no vitals taken for this visit.  Visit Diagnosis:  Post-mastectomy lymphedema syndrome  Stiffness of right shoulder joint  Breast cancer, right  Lymphedema of upper extremity  Decreased muscle strength      Subjective Assessment - 12/29/13 0939    Symptoms it still feels tight.  She states that her index finger feels like it wants to "pop"   Patient Stated Goals decrease the swelling in her arm and hand   Currently in Pain? No/denies            [facility] Adult PT Treatment/Exercise - 12/29/13 1244    Manual Therapy   Manual Therapy Myofascial release;Manual Lymphatic Drainage (MLD)   Myofascial Release gentle soft tissue stretching at tight scar tissue on chest with myofascial pulling through shoulder      Manual  lymph drainage in supine as follows: short neck, left axillary nodes, right inguinal nodes, superficial and deep abdominals; anterior inter-axillary anastamoses, right axillo-inguinal anastamoses. Right upper extremity from fingers and dorsal hand to lateral shoulder redirecting along pathways.         Plan - 12/29/13 1247    Clinical Impression Statement discussed use of isotoner type of driving glove for right hand and she does not want to bandage and does not have resources for compression glove. Hand apeared to be less swollen today. Issued tg soft for arm   PT Next Visit Plan Remeasure. manual lymph draianage, myofascial release and exercise and manual techniques to improve ROM   Consulted and Agree with Plan of Care Patient      Problem List Patient Active Problem List   Diagnosis Date Noted  . Lymphedema 12/09/2013  . Onychomycosis 08/19/2013  . Diabetic neuropathy, type II diabetes mellitus 04/09/2013  . Rotator cuff tendinitis 09/09/2012  . Health care maintenance 08/26/2012  . Dysphagia 08/26/2012  . HTN, goal below 140/90 08/13/2012  . Chronic kidney disease 08/13/2012  . Subacromial bursitis 08/12/2012  . Glaucoma associated with systemic syndromes(365.44) 08/07/2012  . Left arm pain 07/28/2012  . Current non-adherence to medical treatment 06/12/2012  .  Hypothyroidism 04/16/2006  . Diabetes mellitus type 2 with neurological manifestations 04/16/2006  . ANEMIA-NOS 04/16/2006  . DEPRESSION 04/16/2006  . OSTEOARTHRITIS 04/16/2006  . LOW BACK PAIN 04/16/2006  . BREAST CANCER, HX OF 04/16/2006  . HYPERLIPIDEMIA 10/13/2003  Donato Heinz. Owens Shark, PT    Norwood Levo 12/29/2013, 12:51 PM

## 2014-01-03 ENCOUNTER — Other Ambulatory Visit: Payer: Self-pay

## 2014-01-03 ENCOUNTER — Ambulatory Visit: Payer: Medicare Other | Admitting: Physical Therapy

## 2014-01-03 DIAGNOSIS — Z1231 Encounter for screening mammogram for malignant neoplasm of breast: Secondary | ICD-10-CM

## 2014-01-05 ENCOUNTER — Ambulatory Visit: Payer: Medicare Other | Admitting: Physical Therapy

## 2014-01-05 DIAGNOSIS — C50911 Malignant neoplasm of unspecified site of right female breast: Secondary | ICD-10-CM

## 2014-01-05 DIAGNOSIS — Z5189 Encounter for other specified aftercare: Secondary | ICD-10-CM | POA: Diagnosis not present

## 2014-01-05 DIAGNOSIS — I972 Postmastectomy lymphedema syndrome: Secondary | ICD-10-CM

## 2014-01-05 DIAGNOSIS — M25611 Stiffness of right shoulder, not elsewhere classified: Secondary | ICD-10-CM

## 2014-01-05 DIAGNOSIS — I890 Lymphedema, not elsewhere classified: Secondary | ICD-10-CM

## 2014-01-05 DIAGNOSIS — M6281 Muscle weakness (generalized): Secondary | ICD-10-CM

## 2014-01-05 NOTE — Therapy (Signed)
[place], [place], [address] Phone: [phone]   Fax:  [phone]  Physical Therapy Treatment  Patient Details  Name: [name] MRN: [id number] Date of Birth: 08/16/1950  Encounter Date: 01/05/2014      PT End of Session - 01/05/14 1832    PT Start Time 0930   PT Stop Time 1015   PT Time Calculation (min) 45 min      Past Medical History  Diagnosis Date  . Allergy   . Anemia   . Depression   . Arthritis   . Cancer   . Cataract   . Diabetes mellitus without complication   . Glaucoma   . Hyperlipidemia   . Hypertension   . Chronic kidney disease   . Neuromuscular disorder   . Thyroid disease   . Left arm pain 07/28/2012  . BREAST CANCER, HX OF 04/16/2006    Qualifier: Diagnosis of  By: Leward Quan MD, Pamala Hurry    . Diverticulosis     Past Surgical History  Procedure Laterality Date  . Abdominal hysterectomy    . Breast surgery    . Eye surgery    . Tubal ligation      There were no vitals taken for this visit.  Visit Diagnosis:  Post-mastectomy lymphedema syndrome  Stiffness of right shoulder joint  Breast cancer, right  Lymphedema of upper extremity  Decreased muscle strength      Subjective Assessment - 01/05/14 0942    Symptoms pt has been wearing tg soft and feels tg soft has helped some. she wants to be measured for her compression garments   Currently in Pain? No/denies      Treatment Manual lymph drainage in supine as follows: short neck, left axillary nodes, right inguinal nodes, superficial and deep abdominals; anterior inter-axillary anastamoses, right axillo-inguinal anastamoses. Right upper extremity from fingers and dorsal hand to lateral shoulder redirecting along pathways. Incorporated remedial exercise into treatment         Plan - 01/05/14 1017    Clinical Impression Statement arm is softer with some gains in forearm and upper arm. pt will work on Nurse, learning disability. will start process to get daytime and nighttime compression garments   PT Next Visit Plan . manual lymph draianage, myofascial release and exercise and manual techniques to improve ROM              LYMPHEDEMA/ONCOLOGY QUESTIONNAIRE - 01/05/14 0945    Right Upper Extremity Lymphedema   10 cm Proximal to Olecranon Process 33.5 cm   Olecranon Process 27.7 cm   15 cm Proximal to Ulnar Styloid Process 27.5 cm   10 cm Proximal to Ulnar Styloid Process 23.5 cm   Just Proximal to Ulnar Styloid Process 17.8 cm   Across Hand at [employer] 21.3 cm   At [place] of 2nd Digit 7 cm            Short Term Clinic Goals - 01/05/14 1831    CC Short Term Goal  #1   Title pt will verbalize and be able to perform self manual lymph drainage for her right arm   Time 3   Period Weeks   Status On-going   CC Short Term Goal  #2   Title pt will be able to do a home exercise program for increased lymphatic flow   Time 3   Period Weeks   Status On-going   CC Short Term Goal  #3  Title pt will have decrease in cicumferential measurement of .5 cm at across right  hand at thumb webspace   Time 3   Status On-going         [place] - 01/05/14 1831    CC Long Term Goal  #1   Title patient will report she knows how to manage her arm lymphedema at home   Period Weeks   Status On-going   CC Long Term Goal  #2   Title pt will have reduction of 1.0 cm circumferential measurement at 15cm proximal to the right ulnar styloid   Period Weeks   Status On-going   CC Long Term Goal  #3   Title  pt will have 120 degrees of active right shoulder flexion to have easier activities of daily liviing   Time 6   Period Weeks   Status On-going         Problem List Patient Active Problem List   Diagnosis Date Noted  . Lymphedema 12/09/2013  . Onychomycosis 08/19/2013  . Diabetic neuropathy, type II diabetes mellitus 04/09/2013  . Rotator cuff tendinitis  09/09/2012  . Health care maintenance 08/26/2012  . Dysphagia 08/26/2012  . HTN, goal below 140/90 08/13/2012  . Chronic kidney disease 08/13/2012  . Subacromial bursitis 08/12/2012  . Glaucoma associated with systemic syndromes(365.44) 08/07/2012  . Left arm pain 07/28/2012  . Current non-adherence to medical treatment 06/12/2012  . Hypothyroidism 04/16/2006  . Diabetes mellitus type 2 with neurological manifestations 04/16/2006  . ANEMIA-NOS 04/16/2006  . DEPRESSION 04/16/2006  . OSTEOARTHRITIS 04/16/2006  . LOW BACK PAIN 04/16/2006  . BREAST CANCER, HX OF 04/16/2006  . HYPERLIPIDEMIA 10/13/2003  Donato Heinz. Owens Shark, PT 01/05/2014, 6:33 PM

## 2014-01-07 ENCOUNTER — Ambulatory Visit: Payer: Self-pay

## 2014-01-07 ENCOUNTER — Encounter: Payer: Self-pay | Admitting: Podiatry

## 2014-01-07 ENCOUNTER — Ambulatory Visit (INDEPENDENT_AMBULATORY_CARE_PROVIDER_SITE_OTHER): Payer: Medicare Other | Admitting: Podiatry

## 2014-01-07 VITALS — BP 149/89 | HR 88 | Resp 12

## 2014-01-07 DIAGNOSIS — L84 Corns and callosities: Secondary | ICD-10-CM

## 2014-01-07 DIAGNOSIS — M79676 Pain in unspecified toe(s): Secondary | ICD-10-CM | POA: Diagnosis not present

## 2014-01-07 DIAGNOSIS — B351 Tinea unguium: Secondary | ICD-10-CM

## 2014-01-07 DIAGNOSIS — R52 Pain, unspecified: Secondary | ICD-10-CM

## 2014-01-07 NOTE — Patient Instructions (Signed)
Terbinafine tablets What is this medicine? TERBINAFINE (TER bin a feen) is an antifungal medicine. It is used to treat certain kinds of fungal or yeast infections. This medicine may be used for other purposes; ask your health care provider or pharmacist if you have questions. COMMON BRAND NAME(S): Lamisil, Terbinex What should I tell my health care provider before I take this medicine? They need to know if you have any of these conditions: -drink alcoholic beverages -kidney disease -liver disease -an unusual or allergic reaction to terbinafine, other medicines, foods, dyes, or preservatives -pregnant or trying to get pregnant -breast-feeding How should I use this medicine? Take this medicine by mouth with a full glass of water. Follow the directions on the prescription label. You can take this medicine with food or on an empty stomach. Take your medicine at regular intervals. Do not take your medicine more often than directed. Do not skip doses or stop your medicine early even if you feel better. Do not stop taking except on your doctor's advice. Talk to your pediatrician regarding the use of this medicine in children. Special care may be needed. Overdosage: If you think you have taken too much of this medicine contact a poison control center or emergency room at once. NOTE: This medicine is only for you. Do not share this medicine with others. What if I miss a dose? If you miss a dose, take it as soon as you can. If it is almost time for your next dose, take only that dose. Do not take double or extra doses. What may interact with this medicine? Do not take this medicine with any of the following medications: -thioridazine This medicine may also interact with the following medications: -beta-blockers -caffeine -cimetidine -cyclosporine -medicines for depression, anxiety, or psychotic disturbances -medicines for fungal infections like fluconazole and ketoconazole -medicines for irregular  heartbeat like amiodarone, flecainide and propafenone -rifampin -warfarin This list may not describe all possible interactions. Give your health care provider a list of all the medicines, herbs, non-prescription drugs, or dietary supplements you use. Also tell them if you smoke, drink alcohol, or use illegal drugs. Some items may interact with your medicine. What should I watch for while using this medicine? Visit your doctor or health care provider regularly. Tell your doctor right away if you have nausea or vomiting, loss of appetite, stomach pain on your right upper side, yellow skin, dark urine, light stools, or are over tired. Some fungal infections need many weeks or months of treatment to cure. If you are taking this medicine for a long time, you will need to have important blood work done. What side effects may I notice from receiving this medicine? Side effects that you should report to your doctor or health care professional as soon as possible: -allergic reactions like skin rash or hives, swelling of the face, lips, or tongue -changes in vision -dark urine -fever or infection -general ill feeling or flu-like symptoms -light-colored stools -loss of appetite, nausea -redness, blistering, peeling or loosening of the skin, including inside the mouth -right upper belly pain -unusually weak or tired -yellowing of the eyes or skin Side effects that usually do not require medical attention (report to your doctor or health care professional if they continue or are bothersome): -changes in taste -diarrhea -hair loss -muscle or joint pain -stomach gas -stomach upset This list may not describe all possible side effects. Call your doctor for medical advice about side effects. You may report side effects to FDA at   1-800-FDA-1088. Where should I keep my medicine? Keep out of the reach of children. Store at room temperature below 25 degrees C (77 degrees F). Protect from light. Throw away any  unused medicine after the expiration date. NOTE: This sheet is a summary. It may not cover all possible information. If you have questions about this medicine, talk to your doctor, pharmacist, or health care provider.  2015, [employer]vier/Gold Standard. (2007-03-27 16:28:07) Diabetes and Foot Care Diabetes may cause you to have problems because of poor blood supply (circulation) to your feet and legs. This may cause the skin on your feet to become thinner, break easier, and heal more slowly. Your skin may become dry, and the skin may peel and crack. You may also have nerve damage in your legs and feet causing decreased feeling in them. You may not notice minor injuries to your feet that could lead to infections or more serious problems. Taking care of your feet is one of the most important things you can do for yourself.  HOME CARE INSTRUCTIONS  Wear shoes at all times, even in the house. Do not go barefoot. Bare feet are easily injured.  Check your feet daily for blisters, cuts, and redness. If you cannot see the bottom of your feet, use a mirror or ask someone for help.  Wash your feet with warm water (do not use hot water) and mild soap. Then pat your feet and the areas between your toes until they are completely dry. Do not soak your feet as this can dry your skin.  Apply a moisturizing lotion or petroleum jelly (that does not contain alcohol and is unscented) to the skin on your feet and to dry, brittle toenails. Do not apply lotion between your toes.  Trim your toenails straight across. Do not dig under them or around the cuticle. File the edges of your nails with an emery board or nail file.  Do not cut corns or calluses or try to remove them with medicine.  Wear clean socks or stockings every day. Make sure they are not too tight. Do not wear knee-high stockings since they may decrease blood flow to your legs.  Wear shoes that fit properly and have enough cushioning. To break in new shoes,  wear them for just a few hours a day. This prevents you from injuring your feet. Always look in your shoes before you put them on to be sure there are no objects inside.  Do not cross your legs. This may decrease the blood flow to your feet.  If you find a minor scrape, cut, or break in the skin on your feet, keep it and the skin around it clean and dry. These areas may be cleansed with mild soap and water. Do not cleanse the area with peroxide, alcohol, or iodine.  When you remove an adhesive bandage, be sure not to damage the skin around it.  If you have a wound, look at it several times a day to make sure it is healing.  Do not use heating pads or hot water bottles. They may burn your skin. If you have lost feeling in your feet or legs, you may not know it is happening until it is too late.  Make sure your health care provider performs a complete foot exam at least annually or more often if you have foot problems. Report any cuts, sores, or bruises to your health care provider immediately. SEEK MEDICAL CARE IF:   You have an injury that  is not healing.  You have cuts or breaks in the skin.  You have an ingrown nail.  You notice redness on your legs or feet.  You feel burning or tingling in your legs or feet.  You have pain or cramps in your legs and feet.  Your legs or feet are numb.  Your feet always feel cold. SEEK IMMEDIATE MEDICAL CARE IF:   There is increasing redness, swelling, or pain in or around a wound.  There is a red line that goes up your leg.  Pus is coming from a wound.  You develop a fever or as directed by your health care provider.  You notice a bad smell coming from an ulcer or wound. Document Released: 01/12/2000 Document Revised: 09/16/2012 Document Reviewed: 06/23/2012 [facility] Patient Information 2015 [place], [place]. This information is not intended to replace advice given to you by your health care provider. Make sure you discuss any questions you  have with your health care provider. Onychomycosis/Fungal Toenails  WHAT IS IT? An infection that lies within the keratin of your nail plate that is caused by a fungus.  WHY ME? Fungal infections affect all ages, sexes, races, and creeds.  There may be many factors that predispose you to a fungal infection such as age, coexisting medical conditions such as diabetes, or an autoimmune disease; stress, medications, fatigue, genetics, etc.  Bottom line: fungus thrives in a warm, moist environment and your shoes offer such a location.  IS IT CONTAGIOUS? Theoretically, yes.  You do not want to share shoes, nail clippers or files with someone who has fungal toenails.  Walking around barefoot in the same room or sleeping in the same bed is unlikely to transfer the organism.  It is important to realize, however, that fungus can spread easily from one nail to the next on the same foot.  HOW DO WE TREAT THIS?  There are several ways to treat this condition.  Treatment may depend on many factors such as age, medications, pregnancy, liver and kidney conditions, etc.  It is best to ask your doctor which options are available to you.   No treatment.   Unlike many other medical concerns, you can live with this condition.  However for many people this can be a painful condition and may lead to ingrown toenails or a bacterial infection.  It is recommended that you keep the nails cut short to help reduce the amount of fungal nail.  Topical treatment.  These range from herbal remedies to prescription strength nail lacquers.  About 40-50% effective, topicals require twice daily application for approximately 9 to 12 months or until an entirely new nail has grown out.  The most effective topicals are medical grade medications available through physicians offices.  Oral antifungal medications.  With an 80-90% cure rate, the most common oral medication requires 3 to 4 months of therapy and stays in your system for a year as  the new nail grows out.  Oral antifungal medications do require blood work to make sure it is a safe drug for you.  A liver function panel will be performed prior to starting the medication and after the first month of treatment.  It is important to have the blood work performed to avoid any harmful side effects.  In general, this medication safe but blood work is required.  Laser Therapy.  This treatment is performed by applying a specialized laser to the affected nail plate.  This therapy is noninvasive, fast, and non-painful.  It is not covered by insurance and is therefore, out of pocket.  The results have been very good with a 80-95% cure rate.  [facility] is the only practice in the area to offer this therapy.  Permanent Nail Avulsion.  Removing the entire nail so that a new nail will not grow back.

## 2014-01-07 NOTE — Progress Notes (Signed)
   Subjective:    Patient ID: [name], female    DOB: 13-Mar-1950, 63 y.o.   MRN: [id number]  HPI 63 year old female presents the office today with complaints of toenail discoloration particularly the big toenails. The patient states that she previously has been prescribed Lamisil for which she only took 2 months of but did not finish the treatment as she felt that it was not working. She believes that she has the start of ingrown toenails on both of the big toenails. She denies any recent redness or drainage from around the nail sites with only mild discomfort at the end of the nail. The patient is diabetic and she states her last blood sugar was approximately 200 and her last A1c was 9.71 month ago. She denies any history of ulceration or any tingling or numbness. She denies any intermittent claudication symptoms. No other complaints at this time. No recent injury or trauma to the lower extremities.   Review of Systems  Musculoskeletal: Positive for back pain and gait problem.  All other systems reviewed and are negative.      Objective:   Physical Exam AAO x3, NAD DP/PT pulses palpable b/l, CRT < 3 sec Protective sensation appears to be decreased with Derrel Nip monofilament, Achilles tendon reflex intact.  Nails on the distal aspects are hypertrophic, dystrophic, elongated, brittle. There is evidence of clearing along the proximal nail borders. There is mild incurvation along the medial and lateral nail borders of bilateral hallux. There is no swelling erythema, edema, increase in warmth or any drainage around the areas. Hyperkeratotic lesions submetatarsal 1 bilaterally. There are no open lesions. Adductovarus position of the fifth digits. No areas of pinpoint bony tenderness or pain with vibratory sensation. MMT 5/5, ROM WNL No pain with calf compression, swelling, warmth, erythema.       Assessment & Plan:  63 year old female with symptomatic onychomycosis; mild incurvation  on the distal nail borders bilateral hallux -Treatment options were discussed with the patient including alternatives, risks, complications. -Discussed with the patient that there is evidence of clearing of the onychomycosis on the proximal nail border. I discussed with the patient at Lamisil needs to be taken for toenails for proximally 3 months and that the entire nail has to grow out in order to see results. Since there is evidence of clearing of the nail fungus would recommend the patient to finish her course of Lamisil. She states that she has the prescription at home. However since his been some times that she took medication would recommend follow-up with her primary care physician who prescribed medication before restarting in case blood work needs to be obtained. Discussed with her that we can also add a topical antifungal if desired however we'll hold off at this time. -Nails are sharply debrided without, complications to patient comfort removing the ingrown toenails. Discussed the patient to continue to monitor the area for any worsening symptoms or signs or symptoms of infection. If any are to occur call the office. She may need to have a partial nail avulsion performed. -Hyperkeratotic lesion sharply debrided without, complications. -Discussed the importance of daily foot inspection. -Recommended 3 month follow-up for diabetic risk assessment and nail care. -Follow-up in 3 months or sooner should any Gardner arise. In the meantime, call the office with any questions, concerns, change in symptoms.

## 2014-01-10 ENCOUNTER — Encounter: Payer: Self-pay | Admitting: Internal Medicine

## 2014-01-10 ENCOUNTER — Ambulatory Visit (INDEPENDENT_AMBULATORY_CARE_PROVIDER_SITE_OTHER): Payer: Medicare Other | Admitting: Internal Medicine

## 2014-01-10 ENCOUNTER — Encounter: Payer: Medicare Other | Admitting: Physical Therapy

## 2014-01-10 VITALS — BP 165/68 | HR 88 | Temp 98.1°F | Ht 63.0 in | Wt 161.5 lb

## 2014-01-10 DIAGNOSIS — E1149 Type 2 diabetes mellitus with other diabetic neurological complication: Secondary | ICD-10-CM

## 2014-01-10 DIAGNOSIS — R09A2 Foreign body sensation, throat: Secondary | ICD-10-CM

## 2014-01-10 DIAGNOSIS — F458 Other somatoform disorders: Secondary | ICD-10-CM | POA: Diagnosis not present

## 2014-01-10 DIAGNOSIS — E1165 Type 2 diabetes mellitus with hyperglycemia: Secondary | ICD-10-CM | POA: Diagnosis not present

## 2014-01-10 DIAGNOSIS — Z794 Long term (current) use of insulin: Secondary | ICD-10-CM

## 2014-01-10 DIAGNOSIS — R198 Other specified symptoms and signs involving the digestive system and abdomen: Secondary | ICD-10-CM

## 2014-01-10 DIAGNOSIS — R0989 Other specified symptoms and signs involving the circulatory and respiratory systems: Secondary | ICD-10-CM | POA: Insufficient documentation

## 2014-01-10 NOTE — Progress Notes (Signed)
Patient ID: [name], female   DOB: 1950/06/09, 63 y.o.   MRN: [id number]   Subjective:   Patient ID: [name] female   DOB: Nov 06, 1950 63 y.o.   MRN: [id number]  HPI: [name] is a 63 y.o. witrh PMh listed below. Presented today for follow up visit for follow up on her diabetes.   Past Medical History  Diagnosis Date  . Allergy   . Anemia   . Depression   . Arthritis   . Cancer   . Cataract   . Diabetes mellitus without complication   . Glaucoma   . Hyperlipidemia   . Hypertension   . Chronic kidney disease   . Neuromuscular disorder   . Thyroid disease   . Left arm pain 07/28/2012  . BREAST CANCER, HX OF 04/16/2006    Qualifier: Diagnosis of  By: Leward Quan MD, Pamala Hurry    . Diverticulosis    Current Outpatient Prescriptions  Medication Sig Dispense Refill  . acetaminophen (TYLENOL) 325 MG tablet Take 325 mg by mouth every 8 (eight) hours as needed (pain).    [name] aspirin EC 81 MG tablet Take 81 mg by mouth at bedtime.    . B-D ULTRAFINE III SHORT PEN 31G X 8 MM MISC     . Cholecalciferol (VITAMIN D-3) 1000 UNITS CAPS Take 1,000 Units by mouth daily.    . clobetasol cream (TEMOVATE) 2.77 % Apply 1 application topically 2 (two) times daily as needed (rash).     . cycloSPORINE (RESTASIS) 0.05 % ophthalmic emulsion Place 1 drop into both eyes 2 (two) times daily.    [name] gabapentin (NEURONTIN) 100 MG capsule TAKE 1 CAPSULE BY MOUTH EVERY NIGHT AT BEDTIME 30 capsule 3  . glucose 4 GM chewable tablet Chew 4 tablets (16 g total) by mouth as needed for low blood sugar. 50 tablet 12  . HYDROcodone-acetaminophen (NORCO/VICODIN) 5-325 MG per tablet Take 1-2 tablets by mouth every 4 (four) hours as needed. 20 tablet 0  . insulin aspart (NOVOLOG FLEXPEN) 100 UNIT/ML FlexPen Inject 10-12 units before meals 15 mL   . Insulin Detemir (LEVEMIR FLEXTOUCH) 100 UNIT/ML Pen Inject 45 Units into the skin at bedtime. 15 mL 6  . Insulin Pen Needle (BD PEN NEEDLE NANO U/F) 32G X 4 MM MISC Use  as directed 100 each 11  . Insulin Syringe-Needle U-100 (B-D INS SYR ULTRAFINE .5CC/30G) 30G X 1/2" 0.5 ML MISC To use 4 times daily for injecting insulin. diag code 250.42. Insulin dependent 150 each 6  . levothyroxine (SYNTHROID, LEVOTHROID) 75 MCG tablet Take 75 mcg by mouth daily before breakfast.    . meloxicam (MOBIC) 15 MG tablet TAKE 1 TABLET BY MOUTH DAILY AS NEEDED FOR PAIN 30 tablet 0  . metFORMIN (GLUCOPHAGE) 1000 MG tablet TAKE 1 TABLET BY MOUTH TWICE DAILY WITH MEALS 60 tablet 3  . olopatadine (PATANOL) 0.1 % ophthalmic solution Place 1 drop into both eyes 2 (two) times daily.     . Polyvinyl Alcohol-Povidone (REFRESH OP) Place 1 drop into both eyes 3 (three) times daily.    . pravastatin (PRAVACHOL) 40 MG tablet TAKE 1 TABLET BY MOUTH DAILY 30 tablet 3  . terbinafine (LAMISIL) 250 MG tablet Take 1 tablet (250 mg total) by mouth daily. 120 tablet 0  . Travoprost, BAK Free, (TRAVATAN) 0.004 % SOLN ophthalmic solution Place 1 drop into both eyes at bedtime.     . triamterene-hydrochlorothiazide (MAXZIDE-25) 37.5-25 MG per tablet Take 0.5 tablets by mouth daily.    [name]  venlafaxine XR (EFFEXOR-XR) 75 MG 24 hr capsule TAKE 1 CAPSULE BY MOUTH EVERY DAY 30 capsule 1  . zoster vaccine live, PF, (ZOSTAVAX) 85027 UNT/0.65ML injection Inject 19,400 Units into the skin once. 1 each 0   No current facility-administered medications for this visit.   Family History  Problem Relation Age of Onset  . Heart disease Father   . Diabetes Father   . Stroke Sister   . Anuerysm Sister   . Heart disease Brother   . Anuerysm Brother   . Cancer Daughter     breast ca   History   Social History  . Marital Status: Divorced    Spouse Name: N/A    Number of Children: N/A  . Years of Education: N/A   Social History Main Topics  . Smoking status: Never Smoker   . Smokeless tobacco: Never Used  . Alcohol Use: No  . Drug Use: No  . Sexual Activity: None   Other Topics Concern  . None   Social  History Narrative   Review of Systems: CONSTITUTIONAL- No Fever, weightloss, night sweat or change in appetite. SKIN- No Rash, colour changes or itching, but has a swelling on her wrist- months, not painful.  HEAD- Mild occasional  Headaches or dizziness. EYES- No Vision loss, pain, redness, double or blurred vision. Mouth/throat- Feels something is in her throat that she feels when she swallows, present for about 2 months, but no sorethroat or change in voice,  wears dentures, no bleeding gums. RESPIRATORY- No Cough or SOB. CARDIAC- No Palpitations, DOE, PND or chest pain. GI- No nausea, vomiting, diarrhoea, constipation, abd pain. URINARY- No Frequency, urgency, straining or dysuria. NEUROLOGIC- No Numbness, syncope, seizures or burning, has unchanged chronic back pain. [facility]- Denies depression or anxiety.  Objective:  Physical Exam: Filed Vitals:   01/10/14 1006  BP: 165/68  Pulse: 88  Temp: 98.1 F (36.7 C)  TempSrc: Oral  Height: 5\' 3"  (1.6 m)  Weight: 161 lb 8 oz (73.256 kg)  SpO2: 97%   GENERAL- alert, co-operative, appears as stated age, not in any distress. HEENT- Atraumatic, normocephalic, PERRL, EOMI, oral mucosa appears moist, neck supple, no appreciable pharyngeal erythema, but exam limited by pts body habitus, can not appreciate tonsils, but no asymentry identified. CARDIAC- RRR, no murmurs, rubs or gallops. RESP- Moving equal volumes of air, and clear to auscultation bilaterally, no wheezes or crackles. ABDOMEN- Soft, nontender, no guarding or rebound, no palpable masses or organomegaly, bowel sounds present. BACK- Normal curvature of the spine, No tenderness along the vertebrae, no CVA tenderness. NEURO- No obvious Cr N abnormality, strenght upper and lower extremities-  intact, Gait- Normal. EXTREMITIES- pulse 2+, symmetric, no pedal edema, has a nodular ~3 by ~3cm swelling on volar asp[ect of wrist, firm, not fluctuant, not tender, says when any body examines it  it appears to get bigger.  SKIN- Warm, dry, No rash or lesion. PSYCH- Normal mood and affect, appropriate thought content and speech.  Assessment & Plan:   The patient's case and plan of care was discussed with attending physician, Dr. Daryll Drown.  Please see problem based charting for assessment and plan.

## 2014-01-10 NOTE — Patient Instructions (Addendum)
General Instructions:    It was nice to see you today. We want you to come in a week with your glucometer. Please Check your blood sugars three times a day and when you feel it is low.   If  Your blood sugars are low, write down when you ate, if you ate a full meal, how much Novolog you took, and when. This is very important.   Do not take your Novolog if you are not going to eat.   Please bring your medicines with you each time you come to clinic.  Medicines may include prescription medications, over-the-counter medications, herbal remedies, eye drops, vitamins, or other pills.  Remember to do exercises, take walks.   Exercise to Lose Weight Exercise and a healthy diet may help you lose weight. Your doctor may suggest specific exercises. EXERCISE IDEAS AND TIPS  Choose low-cost things you enjoy doing, such as walking, bicycling, or exercising to workout videos.  Take stairs instead of the elevator.  Walk during your lunch break.  Park your car further away from work or school.  Go to a gym or an exercise class.  Start with 5 to 10 minutes of exercise each day. Build up to 30 minutes of exercise 4 to 6 days a week.  Wear shoes with good support and comfortable clothes.  Stretch before and after working out.  Work out until you breathe harder and your heart beats faster.  Drink extra water when you exercise.  Do not do so much that you hurt yourself, feel dizzy, or get very short of breath. Exercises that burn about 150 calories:  Running 1  miles in 15 minutes.  Playing volleyball for 45 to 60 minutes.  Washing and waxing a car for 45 to 60 minutes.  Playing touch football for 45 minutes.  Walking 1  miles in 35 minutes.  Pushing a stroller 1  miles in 30 minutes.  Playing basketball for 30 minutes.  Raking leaves for 30 minutes.  Bicycling 5 miles in 30 minutes.  Walking 2 miles in 30 minutes.  Dancing for 30 minutes.  Shoveling snow for 15  minutes.  Swimming laps for 20 minutes.  Walking up stairs for 15 minutes.  Bicycling 4 miles in 15 minutes.  Gardening for 30 to 45 minutes.  Jumping rope for 15 minutes.  Washing windows or floors for 45 to 60 minutes. Document Released: 02/16/2010 Document Revised: 04/08/2011 Document Reviewed: 02/16/2010 [facility] Patient Information 2015 [place], [place]. This information is not intended to replace advice given to you by your health care provider. Make sure you discuss any questions you have with your health care provider.

## 2014-01-10 NOTE — Assessment & Plan Note (Signed)
Pt says she feels something is in her throat. She feels it when she swallows. No dysphagia or odynophagia. Present for about 2 months. No sorethroat., no horseness of her voice. Exam unremarkable, though limited view of pharyngeal wall, even with spatula depressing tongue. Pt was previously on a PPI which she says she stopped taking, as she did not like the medication. No specific reason. Pt declines medication been prescribed today.  Plan- Conservative management.  - If persistent/recuirrent issue, can consider video floroscopy or refferal to ENT for examination of pharynx.

## 2014-01-10 NOTE — Assessment & Plan Note (Addendum)
Lab Results  Component Value Date   HGBA1C 9.7 12/09/2013   HGBA1C 11.1 07/20/2013   HGBA1C 9.5 04/09/2013     Assessment: Diabetes control:  Uncontrolled Progress toward A1C goal:   Not at goal Comments: Takes 45u of levemir at bedtime, everyday. She has not been complaint with her novolog because she ran out of strips and so to make them last long she has been checking her BG once a day. Per log she has not check it in 4 days. Also pts meals are not regular, she says he has food, though this used to be an issue in the past, she now has food stamps, which she started getting 6 months ago. She denies need for help with this today. Pt log today shows svearl readings over the past month. Blood sugars checked randomly, once a day, 40 readings. Reads on the average are high, with some several low readings-50s and 60s. Fastings mostly 200s. Average read- 218. Pt can not explain circumstances surrounding low reads. She has been checking her blood sugars and taking novolog inconsistently, and sometimes without check blood sugars, which is dangerous.   Plan: Medications:  Pt to continue Levemir- 45u daily, as it appears her fastings are high. Will not increase as she has some readings in the 50s in the afternoon, but patient thinks she did not eat all day, but cannot remember if she took novolog. Complaint with metformin 1000mg  BID. Home glucose monitoring: Frequency:  three times,  Timing:  All fasting and before meals. Instruction/counseling given: reminded to bring blood glucose meter & log to each visit, reminded to bring medications to each visit and discussed the need for weight loss Educational resources provided: handout Self management tools provided: copy of home glucose meter download Other plans: Pt to come in 1 week, with LOG, check blood sugars 3 times daily, write all circumstance around reading less than 70. Includig meals eaten, amount of novolog taken if any and the time.  -  Exercise - Weightloss emphasized - Consider adding other oral hypoglycemic agent, with levemir, rather than Novolog, if patient still doesn't undertsand use of Novolog. - Pt told to eat 3 meals aday. - Take Novolog only if blood sugars >70and is about to eat. - Appears HgBA1c has never been less than 8, per chart review. Pt says he was diagnosed with Dm in the 90s, and can not remember her Hgba1c less than 8.  - She might need refferal to endocrinology in the future. - f/u in a week.

## 2014-01-11 ENCOUNTER — Telehealth: Payer: Self-pay | Admitting: *Deleted

## 2014-01-11 ENCOUNTER — Encounter: Payer: Medicare Other | Admitting: Physical Therapy

## 2014-01-11 NOTE — Progress Notes (Signed)
Internal Medicine Clinic Attending  Case discussed with Dr. Emokpae soon after the resident saw the patient.  We reviewed the resident's history and exam and pertinent patient test results.  I agree with the assessment, diagnosis, and plan of care documented in the resident's note. 

## 2014-01-11 NOTE — Addendum Note (Signed)
Addended by: Gilles Chiquito B on: 01/11/2014 11:54 AM   Modules accepted: Level of Service

## 2014-01-11 NOTE — Telephone Encounter (Signed)
That would be fine. Thank you Hilda Blades

## 2014-01-11 NOTE — Telephone Encounter (Signed)
Pt stopped by clinic needs Rx for one right silicone breast prostheses. Will be starting swimming classes. Needs to be faxed to [employer] fax [phone]. Hilda Blades Champagne Paletta RN 01/11/14 2:30PM

## 2014-01-12 ENCOUNTER — Other Ambulatory Visit: Payer: Self-pay | Admitting: Internal Medicine

## 2014-01-12 ENCOUNTER — Ambulatory Visit: Payer: Medicare Other | Admitting: Physical Therapy

## 2014-01-12 DIAGNOSIS — I890 Lymphedema, not elsewhere classified: Secondary | ICD-10-CM

## 2014-01-12 DIAGNOSIS — I972 Postmastectomy lymphedema syndrome: Secondary | ICD-10-CM

## 2014-01-12 DIAGNOSIS — Z5189 Encounter for other specified aftercare: Secondary | ICD-10-CM | POA: Diagnosis not present

## 2014-01-12 DIAGNOSIS — M6281 Muscle weakness (generalized): Secondary | ICD-10-CM

## 2014-01-12 DIAGNOSIS — M25611 Stiffness of right shoulder, not elsewhere classified: Secondary | ICD-10-CM

## 2014-01-12 DIAGNOSIS — Z853 Personal history of malignant neoplasm of breast: Secondary | ICD-10-CM

## 2014-01-12 DIAGNOSIS — C50911 Malignant neoplasm of unspecified site of right female breast: Secondary | ICD-10-CM

## 2014-01-12 NOTE — Addendum Note (Signed)
Addended by: Marcelino Duster on: 01/12/2014 04:40 PM   Modules accepted: Orders

## 2014-01-12 NOTE — Therapy (Signed)
[place], [place], [address] Phone: [phone]   Fax:  [phone]  Physical Therapy Treatment  Patient Details  Name: [name] MRN: [id number] Date of Birth: [birth year]  Encounter Date: 01/12/2014      PT End of Session - 01/12/14 1021    Visit Number 5   Number of Visits 8   Date for PT Re-Evaluation 01/18/14   PT Start Time 0935   PT Stop Time 1018   PT Time Calculation (min) 43 min   Behavior During Therapy [facility] for tasks assessed/performed      Past Medical History  Diagnosis Date  . Allergy   . Anemia   . Depression   . Arthritis   . Cancer   . Cataract   . Diabetes mellitus without complication   . Glaucoma   . Hyperlipidemia   . Hypertension   . Chronic kidney disease   . Neuromuscular disorder   . Thyroid disease   . Left arm pain 07/28/2012  . BREAST CANCER, HX OF 04/16/2006    Qualifier: Diagnosis of  By: Leward Quan MD, Pamala Hurry    . Diverticulosis     Past Surgical History  Procedure Laterality Date  . Abdominal hysterectomy    . Breast surgery    . Eye surgery    . Tubal ligation      There were no vitals taken for this visit.  Visit Diagnosis:  Post-mastectomy lymphedema syndrome  Stiffness of right shoulder joint  Breast cancer, right  Lymphedema of upper extremity  Decreased muscle strength      Subjective Assessment - 01/12/14 0944    Symptoms Pt thinks her arm is doing a little better   Pain Score 3    Pain Location Leg   Pain Orientation Right;Left;Posterior   Pain Descriptors / Indicators Dull   Pain Type Acute pain   Effect of Pain on Daily Activities pt says pain makes her want to slump over when she walks            [facility] Adult PT Treatment/Exercise - 01/12/14 1020    Lumbar Exercises: Supine   Bridge 5 reps   Lumbar Exercises: Sidelying   Clam 5 reps   Shoulder Exercises: Supine   Other Supine Exercises bilateral chest press x 10 reps        Manual lymph drainage in supine as follows: short neck, left axillary nodes, right inguinal nodes, superficial and deep abdominals; anterior inter-axillary anastamoses, right axillo-inguinal anastamoses. Right upper extremity from fingers and dorsal hand to lateral shoulder redirecting along pathways.          Plan - 01/12/14 1021    Clinical Impression Statement Pt continues with reductions in circumferences of arms .  she is to be measured for her sleeve and glove tomorrow morning. Began introduction to Strength ABC program   PT Next Visit Plan progress with strength ABC program with no weights until she gets compression sleeve. Review knowledge of self manual lymph drainage              LYMPHEDEMA/ONCOLOGY QUESTIONNAIRE - 01/12/14 1011    Right Upper Extremity Lymphedema   15 cm Proximal to Olecranon Process 33.7 cm   10 cm Proximal to Olecranon Process 33.3 cm   Olecranon Process 27.4 cm   15 cm Proximal to Ulnar Styloid Process 27.1 cm   10 cm Proximal to Ulnar Styloid Process 23.5 cm   Just Proximal to Ulnar Styloid Process 17.6 cm  Across Hand at [employer] 21.2 cm   At [place] of 2nd Digit 6.8 cm                       Short Term Clinic Goals - 01/12/14 1229    CC Short Term Goal  #1   Title pt will verbalize and be able to perform self manual lymph drainage for her right arm   Time 3   Period Weeks   Status On-going   CC Short Term Goal  #2   Title pt will be able to do a home exercise program for increased lymphatic flow   Time 3   Period Weeks   Status Achieved   CC Short Term Goal  #3   Title pt will have decrease in cicumferential measurement of .5 cm at across right  hand at thumb webspace   Time 3   Period Weeks   Status On-going         Long Term Clinic Goals - 01/12/14 1231    CC Long Term Goal  #1   Title patient will report she knows how to manage her arm lymphedema at home   Time 6   Period Weeks   Status On-going    CC Long Term Goal  #2   Title pt will have reduction of 1.0 cm circumferential measurement at 15cm proximal to the right ulnar styloid   Time 6   Period Weeks   Status On-going   CC Long Term Goal  #3   Title  pt will have 120 degrees of active right shoulder flexion to have easier activities of daily liviing   Time 6   Status On-going         Problem List Patient Active Problem List   Diagnosis Date Noted  . Globus sensation 01/10/2014  . Lymphedema 12/09/2013  . Onychomycosis 08/19/2013  . Diabetic neuropathy, type II diabetes mellitus 04/09/2013  . Rotator cuff tendinitis 09/09/2012  . Health care maintenance 08/26/2012  . Dysphagia 08/26/2012  . HTN, goal below 140/90 08/13/2012  . Chronic kidney disease 08/13/2012  . Subacromial bursitis 08/12/2012  . Glaucoma associated with systemic syndromes(365.44) 08/07/2012  . Left arm pain 07/28/2012  . Current non-adherence to medical treatment 06/12/2012  . Hypothyroidism 04/16/2006  . Diabetes mellitus type 2 with neurological manifestations 04/16/2006  . ANEMIA-NOS 04/16/2006  . DEPRESSION 04/16/2006  . OSTEOARTHRITIS 04/16/2006  . LOW BACK PAIN 04/16/2006  . BREAST CANCER, HX OF 04/16/2006  . HYPERLIPIDEMIA 10/13/2003   .ter Norwood Levo 01/12/2014, 12:35 PM

## 2014-01-13 ENCOUNTER — Telehealth: Payer: Self-pay | Admitting: Internal Medicine

## 2014-01-13 NOTE — Telephone Encounter (Signed)
Pt aware  Rx was faxed to [employer] per C. Boone.

## 2014-01-13 NOTE — Telephone Encounter (Signed)
Called patient and [facility] for patient to come and pick up DMV Placard Form and faxed patient DME ORDER to [facility] [employer] [phone]).

## 2014-01-14 ENCOUNTER — Ambulatory Visit: Payer: Medicare Other | Admitting: Physical Therapy

## 2014-01-14 DIAGNOSIS — M6281 Muscle weakness (generalized): Secondary | ICD-10-CM

## 2014-01-14 DIAGNOSIS — Z5189 Encounter for other specified aftercare: Secondary | ICD-10-CM | POA: Diagnosis not present

## 2014-01-14 DIAGNOSIS — I890 Lymphedema, not elsewhere classified: Secondary | ICD-10-CM

## 2014-01-14 DIAGNOSIS — C50911 Malignant neoplasm of unspecified site of right female breast: Secondary | ICD-10-CM

## 2014-01-14 DIAGNOSIS — I972 Postmastectomy lymphedema syndrome: Secondary | ICD-10-CM

## 2014-01-14 DIAGNOSIS — M25611 Stiffness of right shoulder, not elsewhere classified: Secondary | ICD-10-CM

## 2014-01-14 NOTE — Patient Instructions (Addendum)
  Cane Overhead - Supine  Hold cane at thighs with both hands, extend arms straight over head. Hold ___ seconds. Repeat _10__ times. Do __2_ times per day.

## 2014-01-14 NOTE — Therapy (Signed)
[place], [place], [address] Phone: [phone]   Fax:  [phone]  Physical Therapy Treatment  Patient Details  Name: [name] MRN: [id number] Date of Birth: 07-May-1950  Encounter Date: 01/14/2014      PT End of Session - 01/14/14 0850    Visit Number 6   Number of Visits 8   Date for PT Re-Evaluation 01/18/14   PT Start Time 0815   PT Stop Time 2774   PT Time Calculation (min) 32 min      Past Medical History  Diagnosis Date  . Allergy   . Anemia   . Depression   . Arthritis   . Cancer   . Cataract   . Diabetes mellitus without complication   . Glaucoma   . Hyperlipidemia   . Hypertension   . Chronic kidney disease   . Neuromuscular disorder   . Thyroid disease   . Left arm pain 07/28/2012  . BREAST CANCER, HX OF 04/16/2006    Qualifier: Diagnosis of  By: Leward Quan MD, Pamala Hurry    . Diverticulosis     Past Surgical History  Procedure Laterality Date  . Abdominal hysterectomy    . Breast surgery    . Eye surgery    . Tubal ligation      There were no vitals taken for this visit.  Visit Diagnosis:  Post-mastectomy lymphedema syndrome  Stiffness of right shoulder joint  Breast cancer, right  Lymphedema of upper extremity  Decreased muscle strength                  [facility] Adult PT Treatment/Exercise - 01/14/14 0824    Lumbar Exercises: Supine   Bridge Other (comment)  pain with bridge today so did not do    Lumbar Exercises: Sidelying   Clam 5 reps   Knee/Hip Exercises: Stretches   Active Hamstring Stretch 2 reps   Quad Stretch 2 reps   Hip Flexor Stretch 2 reps   Shoulder Exercises: Supine   Other Supine Exercises bilateral chest press x 10 reps with 1 #   pt with difficulty with coodination of exercise   Shoulder Exercises: ROM/Strengthening   Other ROM/Strengthening Exercises  supine cane exercise for flexion and abduction   Shoulder Exercises:  Stretch   Cross Chest Stretch 1 rep   Wall Stretch - Flexion 1 rep                PT Education - 01/14/14 0850    Education provided Yes   Education Details supine cane exercises   Person(s) Educated Patient   Methods Explanation;Demonstration;Handout   Comprehension Verbalized understanding;Returned demonstration                    Plan - 01/14/14 0850    Clinical Impression Statement pt got measures for daytime compression sleeve and glove and nigttime garment yesterday and is pleased about that She feels that she is getting close to discharge. Worked on more exercise today, but pt had diffculty with correct biomechanical form and found that some of the exercises in the [facility] program caused her symptoms in her back.  Do not feel that she is a good candidate to follow up with that at home.  Reinforced supine cane exercise and wall stretching for shoulder flexion today Pt reports she is doing manual lymph drainage and exercise for her hand at home   PT Next Visit Plan Remeasure circumference and shoulder ROM and  determine if pt ready to d/c.  Reinforce cane and wall stretches.  D/C if pt able to do them and states she understands continued lymphedema management   Consulted and Agree with Plan of Care Patient        Problem List Patient Active Problem List   Diagnosis Date Noted  . Globus sensation 01/10/2014  . Lymphedema 12/09/2013  . Onychomycosis 08/19/2013  . Diabetic neuropathy, type II diabetes mellitus 04/09/2013  . Rotator cuff tendinitis 09/09/2012  . Health care maintenance 08/26/2012  . Dysphagia 08/26/2012  . HTN, goal below 140/90 08/13/2012  . Chronic kidney disease 08/13/2012  . Subacromial bursitis 08/12/2012  . Glaucoma associated with systemic syndromes(365.44) 08/07/2012  . Left arm pain 07/28/2012  . Current non-adherence to medical treatment 06/12/2012  . Hypothyroidism 04/16/2006  . Diabetes mellitus type 2 with neurological  manifestations 04/16/2006  . ANEMIA-NOS 04/16/2006  . DEPRESSION 04/16/2006  . OSTEOARTHRITIS 04/16/2006  . LOW BACK PAIN 04/16/2006  . BREAST CANCER, HX OF 04/16/2006  . HYPERLIPIDEMIA 10/13/2003   Donato Heinz. Owens Shark, PT   01/14/2014, 8:56 AM  [place], [place], [address] Phone: [phone]   Fax:  [phone]

## 2014-01-17 ENCOUNTER — Ambulatory Visit (INDEPENDENT_AMBULATORY_CARE_PROVIDER_SITE_OTHER): Payer: Medicare Other | Admitting: Internal Medicine

## 2014-01-17 VITALS — BP 161/57 | HR 87 | Temp 97.4°F | Ht 63.5 in | Wt 157.7 lb

## 2014-01-17 DIAGNOSIS — E1165 Type 2 diabetes mellitus with hyperglycemia: Secondary | ICD-10-CM | POA: Diagnosis not present

## 2014-01-17 DIAGNOSIS — E1149 Type 2 diabetes mellitus with other diabetic neurological complication: Secondary | ICD-10-CM | POA: Diagnosis not present

## 2014-01-17 DIAGNOSIS — Z794 Long term (current) use of insulin: Secondary | ICD-10-CM | POA: Diagnosis not present

## 2014-01-17 NOTE — Progress Notes (Signed)
Patient ID: [name], female   DOB: [birth year], 63 y.o.   MRN: [id number]   Subjective:   Patient ID: [name] female   DOB: [birth year] 63 y.o.   MRN: [id number]  HPI: [name] is a 63 y.o. with PMH listed below presented for follow up of her diabetes. She was here a week ago and was told to come back to day with her glucometer and with her BP Log. She brought her Glucometer but not Bp log.   Past Medical History  Diagnosis Date  . Allergy   . Anemia   . Depression   . Arthritis   . Cancer   . Cataract   . Diabetes mellitus without complication   . Glaucoma   . Hyperlipidemia   . Hypertension   . Chronic kidney disease   . Neuromuscular disorder   . Thyroid disease   . Left arm pain 07/28/2012  . BREAST CANCER, HX OF 04/16/2006    Qualifier: Diagnosis of  By: Leward Quan MD, Pamala Hurry    . Diverticulosis    Current Outpatient Prescriptions  Medication Sig Dispense Refill  . acetaminophen (TYLENOL) 325 MG tablet Take 325 mg by mouth every 8 (eight) hours as needed (pain).    [name] aspirin EC 81 MG tablet Take 81 mg by mouth at bedtime.    . B-D ULTRAFINE III SHORT PEN 31G X 8 MM MISC     . Cholecalciferol (VITAMIN D-3) 1000 UNITS CAPS Take 1,000 Units by mouth daily.    . clobetasol cream (TEMOVATE) 1.02 % Apply 1 application topically 2 (two) times daily as needed (rash).     . cycloSPORINE (RESTASIS) 0.05 % ophthalmic emulsion Place 1 drop into both eyes 2 (two) times daily.    [name] gabapentin (NEURONTIN) 100 MG capsule TAKE 1 CAPSULE BY MOUTH EVERY NIGHT AT BEDTIME 30 capsule 3  . glucose 4 GM chewable tablet Chew 4 tablets (16 g total) by mouth as needed for low blood sugar. 50 tablet 12  . HYDROcodone-acetaminophen (NORCO/VICODIN) 5-325 MG per tablet Take 1-2 tablets by mouth every 4 (four) hours as needed. 20 tablet 0  . insulin aspart (NOVOLOG FLEXPEN) 100 UNIT/ML FlexPen Inject 10-12 units before meals 15 mL   . Insulin Detemir (LEVEMIR FLEXTOUCH) 100 UNIT/ML Pen  Inject 45 Units into the skin at bedtime. 15 mL 6  . Insulin Pen Needle (BD PEN NEEDLE NANO U/F) 32G X 4 MM MISC Use as directed 100 each 11  . Insulin Syringe-Needle U-100 (B-D INS SYR ULTRAFINE .5CC/30G) 30G X 1/2" 0.5 ML MISC To use 4 times daily for injecting insulin. diag code 250.42. Insulin dependent 150 each 6  . levothyroxine (SYNTHROID, LEVOTHROID) 75 MCG tablet Take 75 mcg by mouth daily before breakfast.    . meloxicam (MOBIC) 15 MG tablet TAKE 1 TABLET BY MOUTH DAILY AS NEEDED FOR PAIN 30 tablet 0  . metFORMIN (GLUCOPHAGE) 1000 MG tablet TAKE 1 TABLET BY MOUTH TWICE DAILY WITH MEALS 60 tablet 3  . olopatadine (PATANOL) 0.1 % ophthalmic solution Place 1 drop into both eyes 2 (two) times daily.     . Polyvinyl Alcohol-Povidone (REFRESH OP) Place 1 drop into both eyes 3 (three) times daily.    . pravastatin (PRAVACHOL) 40 MG tablet TAKE 1 TABLET BY MOUTH DAILY 30 tablet 3  . terbinafine (LAMISIL) 250 MG tablet Take 1 tablet (250 mg total) by mouth daily. 120 tablet 0  . Travoprost, BAK Free, (TRAVATAN) 0.004 % SOLN ophthalmic solution Place 1  drop into both eyes at bedtime.     . triamterene-hydrochlorothiazide (MAXZIDE-25) 37.5-25 MG per tablet Take 0.5 tablets by mouth daily.    [name] venlafaxine XR (EFFEXOR-XR) 75 MG 24 hr capsule TAKE 1 CAPSULE BY MOUTH EVERY DAY 30 capsule 1  . zoster vaccine live, PF, (ZOSTAVAX) 15176 UNT/0.65ML injection Inject 19,400 Units into the skin once. 1 each 0   No current facility-administered medications for this visit.   Review of Systems: CONSTITUTIONAL- No Fever, weightloss, night sweat or change in appetite. SKIN- No Rash, colour changes or itching. HEAD- No Headache or dizziness. CARDIAC- No Palpitations, DOE, PND or chest pain. GI- No nausea, vomiting, diarrhoea, constipation, abd pain. URINARY- No Frequency, urgency, straining or dysuria.  Objective:  Physical Exam: Filed Vitals:   01/17/14 1010  BP: 161/57  Pulse: 87  Temp: 97.4 F (36.3  C)  TempSrc: Oral  Height: 5' 3.5" (1.613 m)  Weight: 157 lb 11.2 oz (71.532 kg)  SpO2: 100%    GENERAL- alert, co-operative, appears as stated age, not in any distress. HEENT- Atraumatic, normocephalic, PERRL, EOMI, oral mucosa appears moist, no cervical LN enlargement, thyroid does not appear enlarged, neck supple. CARDIAC- RRR, no murmurs, rubs or gallops. RESP- Moving equal volumes of air, and clear to auscultation bilaterally, no wheezes or crackles. ABDOMEN- Soft, nontender NEURO- No obvious Cr N abnormality, strenght upper and lower extremities-  intact, Gait- Normal. EXTREMITIES- warm and well perfused, no pedal edema. SKIN- Warm, dry, No rash or lesion. PSYCH- Normal mood and affect, appropriate thought content and speech.  Assessment & Plan:   The patient's case and plan of care was discussed with attending physician, Dr. Daryll Drown.  Please see problem based charting for assessment and plan.

## 2014-01-17 NOTE — Assessment & Plan Note (Addendum)
Lab Results  Component Value Date   HGBA1C 9.7 12/09/2013   HGBA1C 11.1 07/20/2013   HGBA1C 9.5 04/09/2013     Assessment: Diabetes control:  Uncontrolled Progress toward A1C goal:   Not at goal Comments: On Levemir- 45 units, Novolog- 8-11units,  And metformin- 1000mg  BID. Patient brought Glucometer today, after recording for 1 week, since last visit. She has been checking her blood sugars 2ce a day. Hardly any fasting readings, as she does not eat breakfast. Readings range from 92- 401. Average- 200s. 2 readings 92, 94, in the afternoon before lunch, hadn't eaten breakfast or taken Novolog. Other readings checked about this time on other days 266, 341. Some readings- 127, 131, after short acting insulin. After readings in the 90s, patient ate a lot of food to raise her blood sugars, and subsequent blood checks revealed elevated blood sugars in the 400s.   Plan: Medications: Continue current meds. Considering readings of 90s in the afternoon, Did not adjust long acting. Lantus would be a better option for patient, but patients insurace is no longer covering lantus, hence the levemir. Also did not adjust Novolog considering appropriate response of her blood sugars after use.  Home glucose monitoring: Frequency:   Timing:   Instruction/counseling given: reminded to bring medications to each visit, discussed the need for weight loss and discussed diet Educational resources provided:   Self management tools provided:   Other plans: Will refer patient to [name] to work on patients diet, as from my discussion with her, she essentially is not sure what to eat. Her elevated blood sugars are from heavy meals with high caloric content. Subsequently the dose of her Novolog can be increased if still with persistently elevated blood sugars as she is already on high doses of levemir.

## 2014-01-17 NOTE — Patient Instructions (Addendum)
General Instructions:    Also i want you to see our diabetes educator. You will be called with an appointment.   Please bring your medicines with you each time you come to clinic.  Medicines may include prescription medications, over-the-counter medications, herbal remedies, eye drops, vitamins, or other pills.

## 2014-01-18 NOTE — Progress Notes (Signed)
Internal Medicine Clinic Attending  Case discussed with Dr. Emokpae soon after the resident saw the patient.  We reviewed the resident's history and exam and pertinent patient test results.  I agree with the assessment, diagnosis, and plan of care documented in the resident's note. 

## 2014-01-19 ENCOUNTER — Ambulatory Visit: Payer: Medicare Other | Admitting: Physical Therapy

## 2014-01-19 DIAGNOSIS — M25611 Stiffness of right shoulder, not elsewhere classified: Secondary | ICD-10-CM

## 2014-01-19 DIAGNOSIS — Z5189 Encounter for other specified aftercare: Secondary | ICD-10-CM | POA: Diagnosis not present

## 2014-01-19 DIAGNOSIS — I890 Lymphedema, not elsewhere classified: Secondary | ICD-10-CM

## 2014-01-19 NOTE — Therapy (Addendum)
[place], [place], [address] Phone: [phone]   Fax:  [phone]  Physical Therapy Treatment  Patient Details  Name: [name] MRN: [id number] Date of Birth: 1950-07-03  Encounter Date: 01/19/2014      PT End of Session - 01/19/14 1324    Visit Number 7   Number of Visits 8   Date for PT Re-Evaluation 01/18/14   PT Start Time 0849   PT Stop Time 0931   PT Time Calculation (min) 42 min   Activity Tolerance Patient limited by pain   Behavior During Therapy [facility] for tasks assessed/performed      Past Medical History  Diagnosis Date  . Allergy   . Anemia   . Depression   . Arthritis   . Cancer   . Cataract   . Diabetes mellitus without complication   . Glaucoma   . Hyperlipidemia   . Hypertension   . Chronic kidney disease   . Neuromuscular disorder   . Thyroid disease   . Left arm pain 07/28/2012  . BREAST CANCER, HX OF 04/16/2006    Qualifier: Diagnosis of  By: Leward Quan MD, Pamala Hurry    . Diverticulosis     Past Surgical History  Procedure Laterality Date  . Abdominal hysterectomy    . Breast surgery    . Eye surgery    . Tubal ligation      There were no vitals taken for this visit.  Visit Diagnosis:  Stiffness of right shoulder joint  Lymphedema of upper extremity      Subjective Assessment - 01/19/14 0852    Symptoms Nothing new.  Doesn't have garments yet.   Currently in Pain? Yes   Pain Score 3    Pain Location Back   Pain Orientation Lower   Aggravating Factors  worse in the morning   Pain Relieving Factors moving around; pain meds          [facility] PT Assessment - 01/19/14 0001    AROM   Right Shoulder Flexion 120 Degrees   Right Shoulder ABduction 100 Degrees  pt. reports a pulling feeling across her front to her back           LYMPHEDEMA/ONCOLOGY QUESTIONNAIRE - 01/19/14 0857    Right Upper Extremity Lymphedema   15 cm Proximal to Olecranon  Process 35.6 cm   10 cm Proximal to Olecranon Process 34.2 cm   Olecranon Process 28.2 cm   15 cm Proximal to Ulnar Styloid Process 27.7 cm   10 cm Proximal to Ulnar Styloid Process 23.9 cm   Just Proximal to Ulnar Styloid Process 17.8 cm   Across Hand at [employer] 21.7 cm   At [place] of 2nd Digit 7.3 cm               [facility] Adult PT Treatment/Exercise - 01/19/14 0001    Shoulder Exercises: ROM/Strengthening   Other ROM/Strengthening Exercises standing cane exercises for flexion and abduction; standing wall walking exercises for same; chest stretch at wall       Manual lymph drainage in supine as follows: short neck, left axillary nodes, right inguinal nodes, superficial and deep abdominals; anterior inter-axillary anastamoses, right axillo-inguinal anastamoses. Right upper extremity from fingers and dorsal hand to lateral shoulder redirecting along pathways.          PT Education - 01/19/14 1323    Education provided Yes   Education Details standing cane exercises   Person(s) Educated Patient  Methods Explanation;Demonstration;Handout;Verbal cues   Comprehension Returned demonstration           Short Term Clinic Goals - 27-Jan-2014 1326    CC Short Term Goal  #1   Status Achieved   CC Short Term Goal  #3   Status Not Met             Long Term Clinic Goals - January 27, 2014 1327    CC Long Term Goal  #1   Status Achieved   CC Long Term Goal  #2   Status Not Met   CC Long Term Goal  #3   Status Achieved            Plan - 01/27/2014 1324    Clinical Impression Statement Pt. reviewed HEP today and did need cueing for correct technique; was also given handout.  She feels ready for discharge.  She is expecting garments to be delivered to her.   PT Next Visit Plan None: discharge   PT Home Exercise Plan Stretching for right shoulder ROM; self-manual lymph drainage and use of compression garments.   Consulted and Agree with Plan of Care Patient           G-Codes - 01-27-14 1328    Functional Assessment Tool Used clinical judgement   Functional Limitation Other PT primary   Other PT Primary Goal Status (M5784) At least 1 percent but less than 20 percent impaired, limited or restricted   Other PT Primary Discharge Status [phone]) At least 20 percent but less than 40 percent impaired, limited or restricted      Problem List Patient Active Problem List   Diagnosis Date Noted  . Globus sensation 01/10/2014  . Lymphedema 12/09/2013  . Onychomycosis 08/19/2013  . Diabetic neuropathy, type II diabetes mellitus 04/09/2013  . Rotator cuff tendinitis 09/09/2012  . Health care maintenance 08/26/2012  . Dysphagia 08/26/2012  . HTN, goal below 140/90 08/13/2012  . Chronic kidney disease 08/13/2012  . Subacromial bursitis 08/12/2012  . Glaucoma associated with systemic syndromes(365.44) 08/07/2012  . Left arm pain 07/28/2012  . Current non-adherence to medical treatment 06/12/2012  . Hypothyroidism 04/16/2006  . Diabetes mellitus type 2 with neurological manifestations 04/16/2006  . ANEMIA-NOS 04/16/2006  . DEPRESSION 04/16/2006  . OSTEOARTHRITIS 04/16/2006  . LOW BACK PAIN 04/16/2006  . BREAST CANCER, HX OF 04/16/2006  . HYPERLIPIDEMIA 10/13/2003    [name] 2014-01-27, 1:41 PM  [place], [place], [address] Phone: [phone]   Fax:  [phone]  [place], PT   PHYSICAL THERAPY DISCHARGE SUMMARY  Visits from Start of Care: 7  Current functional level related to goals / functional outcomes: See above goals    Remaining deficits: lymphedmema and decreased range of motion in right shoulder    Education / Equipment: Home exercise program and compression garments  Plan: Patient agrees to discharge.  Patient goals were not met. Patient is being discharged due to being pleased with the current functional level.  ?????         Maudry Diego, PT 12/15/2014 1:44 PM

## 2014-01-19 NOTE — Therapy (Deleted)
[place], [place], [address] Phone: [phone]   Fax:  [phone]  January 19, 2014   @CCLISTADDRESS @  Physical Therapy Discharge Summary  Patient: [name]  MRN: [id number]  Date of Birth: 1951/01/21   Diagnosis: Stiffness of right shoulder joint  Lymphedema of upper extremity Physician:   The above patient had been seen in Physical Therapy *** times of *** treatments scheduled with *** no shows and *** cancellations.  The treatment consisted of *** The patient is: {improved/worse/unchanged:3041574}  Subjective: ***  Discharge Findings: ***  Functional Status at Discharge: ***  {GYJEH:6314970}      Plan - 01/19/14 1324    Clinical Impression Statement Pt. reviewed HEP today and did need cueing for correct technique; was also given handout.  She feels ready for discharge.  She is expecting garments to be delivered to her.   PT Next Visit Plan None: discharge   PT Home Exercise Plan Stretching for right shoulder ROM; self-manual lymph drainage and use of compression garments.   Consulted and Agree with Plan of Care Patient      Sincerely,   SALISBURY,DONNA, PT   CC @CCLISTRESTNAME @  [place], [place], [address] Phone: [phone]   Fax:  [phone]

## 2014-01-19 NOTE — Patient Instructions (Signed)
Inferior Capsule Stick Stretch I   Stand or sit, dowel in palm of arm to be stretched. Other arm, holding dowel in front of body, pushes outward and upward until arm being stretched is as high to the side as possible. Hold __5_ seconds. Repeat _5-10__ times per session. Do _2-3__ sessions per day.  Copyright  VHI. All rights reserved.  Flexors Stick Stretch I   Stand or sit, dowel in palm of arm to be stretched. Other arm, holding dowel at side and behind body, pushes arm being stretched forward and upward until straight over head. Hold __5_ seconds. Repeat ___5-10 times per session. Do 2-3___ sessions per day.  Copyright  VHI. All rights reserved.

## 2014-01-19 NOTE — Therapy (Signed)
[place], [place], [address] Phone: [phone]   Fax:  [phone]  Physical Therapy Treatment  Patient Details  Name: [name] MRN: [id number] Date of Birth: 1950-08-04  Encounter Date: 01/19/2014      PT End of Session - 01/19/14 1324    Visit Number 7   Number of Visits 8   Date for PT Re-Evaluation 01/18/14   PT Start Time 0849   PT Stop Time 0931   PT Time Calculation (min) 42 min   Activity Tolerance Patient limited by pain   Behavior During Therapy [facility] for tasks assessed/performed      Past Medical History  Diagnosis Date  . Allergy   . Anemia   . Depression   . Arthritis   . Cancer   . Cataract   . Diabetes mellitus without complication   . Glaucoma   . Hyperlipidemia   . Hypertension   . Chronic kidney disease   . Neuromuscular disorder   . Thyroid disease   . Left arm pain 07/28/2012  . BREAST CANCER, HX OF 04/16/2006    Qualifier: Diagnosis of  By: Leward Quan MD, Pamala Hurry    . Diverticulosis     Past Surgical History  Procedure Laterality Date  . Abdominal hysterectomy    . Breast surgery    . Eye surgery    . Tubal ligation      There were no vitals taken for this visit.  Visit Diagnosis:  Stiffness of right shoulder joint  Lymphedema of upper extremity      Subjective Assessment - 01/19/14 0852    Symptoms Nothing new.  Doesn't have garments yet.   Currently in Pain? Yes   Pain Score 3    Pain Location Back   Pain Orientation Lower   Aggravating Factors  worse in the morning   Pain Relieving Factors moving around; pain meds          [facility] PT Assessment - 01/19/14 0001    AROM   Right Shoulder Flexion 120 Degrees   Right Shoulder ABduction 100 Degrees  pt. reports a pulling feeling across her front to her back           LYMPHEDEMA/ONCOLOGY QUESTIONNAIRE - 01/19/14 0857    Right Upper Extremity Lymphedema   15 cm Proximal to Olecranon  Process 35.6 cm   10 cm Proximal to Olecranon Process 34.2 cm   Olecranon Process 28.2 cm   15 cm Proximal to Ulnar Styloid Process 27.7 cm   10 cm Proximal to Ulnar Styloid Process 23.9 cm   Just Proximal to Ulnar Styloid Process 17.8 cm   Across Hand at [employer] 21.7 cm   At [place] of 2nd Digit 7.3 cm               [facility] Adult PT Treatment/Exercise - 01/19/14 0001    Shoulder Exercises: ROM/Strengthening   Other ROM/Strengthening Exercises standing cane exercises for flexion and abduction; standing wall walking exercises for same; chest stretch at wall                PT Education - 01/19/14 1323    Education provided Yes   Education Details standing cane exercises   Person(s) Educated Patient   Methods Explanation;Demonstration;Handout;Verbal cues   Comprehension Returned demonstration           Short Term Clinic Goals - 01/19/14 1326    CC Short Term Goal  #1   Status  Achieved   CC Short Term Goal  #3   Status Not Met             Long Term Clinic Goals - 02/01/2014 1327    CC Long Term Goal  #1   Status Achieved   CC Long Term Goal  #2   Status Not Met   CC Long Term Goal  #3   Status Achieved            Plan - Feb 01, 2014 1324    Clinical Impression Statement Pt. reviewed HEP today and did need cueing for correct technique; was also given handout.  She feels ready for discharge.  She is expecting garments to be delivered to her.   PT Next Visit Plan None: discharge   PT Home Exercise Plan Stretching for right shoulder ROM; self-manual lymph drainage and use of compression garments.   Consulted and Agree with Plan of Care Patient     PHYSICAL THERAPY DISCHARGE SUMMARY  Visits from Start of Care: 7  Current functional level related to goals / functional outcomes: Goal of reduction of arm swelling was not met.  Swelling reduced but then increased again as patient waits for her new compression garments to arrive.  See goals above.    Remaining deficits: Right shoulder AROM remains limited; swelling will continue to require management.   Education / Equipment: Right shoulder ROM HEP; assisted with obtaining new compression garments.  Plan: Patient agrees to discharge.  Patient goals were partially met. Patient is being discharged due to being pleased with the current functional level.  ?????         G-Codes - Feb 01, 2014 1328    Functional Assessment Tool Used clinical judgement   Functional Limitation Other PT primary   Other PT Primary Goal Status (T4656) At least 1 percent but less than 20 percent impaired, limited or restricted   Other PT Primary Discharge Status [phone]) At least 20 percent but less than 40 percent impaired, limited or restricted      Problem List Patient Active Problem List   Diagnosis Date Noted  . Globus sensation 01/10/2014  . Lymphedema 12/09/2013  . Onychomycosis 08/19/2013  . Diabetic neuropathy, type II diabetes mellitus 04/09/2013  . Rotator cuff tendinitis 09/09/2012  . Health care maintenance 08/26/2012  . Dysphagia 08/26/2012  . HTN, goal below 140/90 08/13/2012  . Chronic kidney disease 08/13/2012  . Subacromial bursitis 08/12/2012  . Glaucoma associated with systemic syndromes(365.44) 08/07/2012  . Left arm pain 07/28/2012  . Current non-adherence to medical treatment 06/12/2012  . Hypothyroidism 04/16/2006  . Diabetes mellitus type 2 with neurological manifestations 04/16/2006  . ANEMIA-NOS 04/16/2006  . DEPRESSION 04/16/2006  . OSTEOARTHRITIS 04/16/2006  . LOW BACK PAIN 04/16/2006  . BREAST CANCER, HX OF 04/16/2006  . HYPERLIPIDEMIA 10/13/2003    Takaya Hyslop February 01, 2014, 1:31 PM  [place], [place], [address] Phone: [phone]   Fax:  [phone]  Serafina Royals, [place]

## 2014-01-25 ENCOUNTER — Other Ambulatory Visit: Payer: Self-pay | Admitting: Internal Medicine

## 2014-01-27 DIAGNOSIS — E11319 Type 2 diabetes mellitus with unspecified diabetic retinopathy without macular edema: Secondary | ICD-10-CM | POA: Diagnosis not present

## 2014-01-27 DIAGNOSIS — I10 Essential (primary) hypertension: Secondary | ICD-10-CM | POA: Diagnosis not present

## 2014-01-27 DIAGNOSIS — H35033 Hypertensive retinopathy, bilateral: Secondary | ICD-10-CM | POA: Diagnosis not present

## 2014-01-27 DIAGNOSIS — H18413 Arcus senilis, bilateral: Secondary | ICD-10-CM | POA: Diagnosis not present

## 2014-01-27 DIAGNOSIS — H2513 Age-related nuclear cataract, bilateral: Secondary | ICD-10-CM | POA: Diagnosis not present

## 2014-01-27 DIAGNOSIS — H4011X1 Primary open-angle glaucoma, mild stage: Secondary | ICD-10-CM | POA: Diagnosis not present

## 2014-01-27 DIAGNOSIS — H25013 Cortical age-related cataract, bilateral: Secondary | ICD-10-CM | POA: Diagnosis not present

## 2014-01-27 DIAGNOSIS — H11153 Pinguecula, bilateral: Secondary | ICD-10-CM | POA: Diagnosis not present

## 2014-01-27 LAB — HM DIABETES EYE EXAM

## 2014-01-28 HISTORY — PX: WRIST GANGLION EXCISION: SUR520

## 2014-02-11 ENCOUNTER — Other Ambulatory Visit: Payer: Self-pay | Admitting: Internal Medicine

## 2014-02-15 ENCOUNTER — Encounter: Payer: Self-pay | Admitting: Internal Medicine

## 2014-02-15 ENCOUNTER — Ambulatory Visit (INDEPENDENT_AMBULATORY_CARE_PROVIDER_SITE_OTHER): Payer: Medicare Other | Admitting: Dietician

## 2014-02-15 ENCOUNTER — Ambulatory Visit (INDEPENDENT_AMBULATORY_CARE_PROVIDER_SITE_OTHER): Payer: Medicare Other | Admitting: Internal Medicine

## 2014-02-15 VITALS — Ht 63.0 in | Wt 160.6 lb

## 2014-02-15 VITALS — BP 165/66 | HR 84 | Temp 98.0°F | Ht 63.0 in | Wt 160.5 lb

## 2014-02-15 DIAGNOSIS — E1140 Type 2 diabetes mellitus with diabetic neuropathy, unspecified: Secondary | ICD-10-CM | POA: Diagnosis not present

## 2014-02-15 DIAGNOSIS — Z794 Long term (current) use of insulin: Secondary | ICD-10-CM | POA: Diagnosis not present

## 2014-02-15 DIAGNOSIS — I10 Essential (primary) hypertension: Secondary | ICD-10-CM | POA: Diagnosis not present

## 2014-02-15 DIAGNOSIS — E1165 Type 2 diabetes mellitus with hyperglycemia: Secondary | ICD-10-CM | POA: Diagnosis not present

## 2014-02-15 DIAGNOSIS — E1149 Type 2 diabetes mellitus with other diabetic neurological complication: Secondary | ICD-10-CM

## 2014-02-15 MED ORDER — METFORMIN HCL ER 500 MG PO TB24: 1000.0000 mg | ORAL_TABLET | Freq: Two times a day (BID) | ORAL | Status: DC

## 2014-02-15 MED ORDER — REPAGLINIDE 1 MG PO TABS: 1.0000 mg | ORAL_TABLET | Freq: Three times a day (TID) | ORAL | Status: DC

## 2014-02-15 MED ORDER — TRIAMTERENE-HCTZ 37.5-25 MG PO TABS: 1.0000 | ORAL_TABLET | Freq: Every day | ORAL | Status: DC

## 2014-02-15 NOTE — Assessment & Plan Note (Addendum)
Lab Results  Component Value Date   HGBA1C 9.7 12/09/2013   HGBA1C 11.1 07/20/2013   HGBA1C 9.5 04/09/2013     Assessment: Diabetes control: poor control (HgbA1C >9%) Progress toward A1C goal:  unchanged Comments: patient not taking Novolog more than daily and having some low blood sugars with it.  Plan: Medications:  Continue Levemir 45u daily, D/C Novolog prandial.  Continue Metformin but to increase complinance will change to extended release.  And will start 1mg  of Prandin TID Home glucose monitoring: Frequency: 3 times a day Timing: before meals Instruction/counseling given: reminded to get eye exam, reminded to bring blood glucose meter & log to each visit, reminded to bring medications to each visit and discussed diet Educational resources provided: brochure (saw Educator today) Self management tools provided: copy of home glucose meter download Other plans: follow up in 1-2 weeks to assess response of prandin.  If that is not effective we could consider VGo-40 as a possible option.

## 2014-02-15 NOTE — Assessment & Plan Note (Signed)
BP Readings from Last 3 Encounters:  02/15/14 165/66  01/17/14 161/57  01/10/14 165/68    Lab Results  Component Value Date   NA 140 12/09/2013   K 4.1 12/09/2013   CREATININE 0.93 12/09/2013    Assessment: Blood pressure control: moderately elevated Progress toward BP goal:  unchanged Comments: above goal for last 3 visits.  Plan: Medications:  Increase Triamterene-HCTZ to 1 tablet of 37.5-25mg  daily Educational resources provided: brochure Self management tools provided:   Other plans: no evidence of proteinuria, and SCr has improved.  Will hold off on starting ACEi.

## 2014-02-15 NOTE — Patient Instructions (Signed)
General Instructions: Please start taking Prandin 1mg  with meals.  You can change to Metformin extended release, i want you to take a total of 4 pills of this a day.  STOP Novolog and Continue Levemir 45units a day.  Please bring your medicines with you each time you come to clinic.  Medicines may include prescription medications, over-the-counter medications, herbal remedies, eye drops, vitamins, or other pills.   Progress Toward Treatment Goals:  Treatment Goal 02/15/2014  Hemoglobin A1C unchanged  Blood pressure unchanged  Prevent falls unable to assess    Self Care Goals & Plans:  Self Care Goal 02/15/2014  Manage my medications take my medicines as prescribed; bring my medications to every visit; refill my medications on time  Monitor my health keep track of my blood glucose; bring my glucose meter and log to each visit  Eat healthy foods drink diet soda or water instead of juice or soda; eat more vegetables; eat foods that are low in salt; eat baked foods instead of fried foods; eat fruit for snacks and desserts  Be physically active find an activity I enjoy  Meeting treatment goals maintain the current self-care plan    Home Blood Glucose Monitoring 02/15/2014  Check my blood sugar 3 times a day  When to check my blood sugar before meals     Care Management & Community Referrals:  Referral 06/17/2013  Referrals made for care management support diabetes educator  Referrals made to community resources -

## 2014-02-15 NOTE — Progress Notes (Signed)
[facility] Subjective:   Patient ID: [name] female   DOB: Jun 20, 1950 64 y.o.   MRN: [id number]  HPI: [name] is a 64 y.o. female with a PMH below who presents for 1 month follow up for DM.  DM: Patient taking Metformin 1g usually once a day, Novolog 8-11u, Levemir 45 units daily.  She is consistantly taking her levemir but only sporatdicly takes novolog, if she does take it she takes it in the mornings.  Review of her glucose log does show a reading of 65 after lunch 1 weeks ago (she reprorts she did take 11 units of novolog that morning.  She has been eating 3 meals a day.  HTN: Patient has been taking 1/2 tablet of Maxzide daily.  No headache or visual changes.  Past Medical History  Diagnosis Date  . Allergy   . Anemia   . Depression   . Arthritis   . Cancer   . Cataract   . Diabetes mellitus without complication   . Glaucoma   . Hyperlipidemia   . Hypertension   . Chronic kidney disease   . Neuromuscular disorder   . Thyroid disease   . Left arm pain 07/28/2012  . BREAST CANCER, HX OF 04/16/2006    Qualifier: Diagnosis of  By: Leward Quan MD, Pamala Hurry    . Diverticulosis    Current Outpatient Prescriptions  Medication Sig Dispense Refill  . acetaminophen (TYLENOL) 325 MG tablet Take 325 mg by mouth every 8 (eight) hours as needed (pain).    [name] aspirin EC 81 MG tablet Take 81 mg by mouth at bedtime.    . Cholecalciferol (VITAMIN D-3) 1000 UNITS CAPS Take 1,000 Units by mouth daily.    . clobetasol cream (TEMOVATE) 3.50 % Apply 1 application topically 2 (two) times daily as needed (rash).     . cycloSPORINE (RESTASIS) 0.05 % ophthalmic emulsion Place 1 drop into both eyes 2 (two) times daily.    [name] gabapentin (NEURONTIN) 100 MG capsule TAKE 1 CAPSULE BY MOUTH EVERY NIGHT AT BEDTIME 30 capsule 3  . Insulin Detemir (LEVEMIR FLEXTOUCH) 100 UNIT/ML Pen Inject 45 Units into the skin at bedtime. 15 mL 6  . levothyroxine (SYNTHROID, LEVOTHROID) 75  MCG tablet Take 75 mcg by mouth daily before breakfast.    . meloxicam (MOBIC) 15 MG tablet TAKE 1 TABLET BY MOUTH DAILY AS NEEDED FOR PAIN 30 tablet 0  . olopatadine (PATANOL) 0.1 % ophthalmic solution Place 1 drop into both eyes 2 (two) times daily.     . pravastatin (PRAVACHOL) 40 MG tablet TAKE 1 TABLET BY MOUTH DAILY 30 tablet 3  . terbinafine (LAMISIL) 250 MG tablet Take 1 tablet (250 mg total) by mouth daily. 120 tablet 0  . Travoprost, BAK Free, (TRAVATAN) 0.004 % SOLN ophthalmic solution Place 1 drop into both eyes at bedtime.     . triamterene-hydrochlorothiazide (MAXZIDE-25) 37.5-25 MG per tablet Take 1 tablet by mouth daily. 90 tablet 3  . B-D ULTRAFINE III SHORT PEN 31G X 8 MM MISC     . glucose 4 GM chewable tablet Chew 4 tablets (16 g total) by mouth as needed for low blood sugar. 50 tablet 12  . Insulin Pen Needle (BD PEN NEEDLE NANO U/F) 32G X 4 MM MISC Use as directed 100 each 11  . Insulin Syringe-Needle U-100 (B-D INS SYR ULTRAFINE .5CC/30G) 30G X 1/2" 0.5 ML MISC To use 4 times daily for injecting insulin. diag code 250.42. Insulin dependent  150 each 6  . metFORMIN (GLUCOPHAGE XR) 500 MG 24 hr tablet Take 2 tablets (1,000 mg total) by mouth 2 (two) times daily. 120 tablet 11  . Polyvinyl Alcohol-Povidone (REFRESH OP) Place 1 drop into both eyes 3 (three) times daily.    . repaglinide (PRANDIN) 1 MG tablet Take 1 tablet (1 mg total) by mouth 3 (three) times daily before meals. 90 tablet 2  . venlafaxine XR (EFFEXOR-XR) 75 MG 24 hr capsule TAKE 1 CAPSULE BY MOUTH EVERY DAY 30 capsule 1  . zoster vaccine live, PF, (ZOSTAVAX) 69485 UNT/0.65ML injection Inject 19,400 Units into the skin once. 1 each 0   No current facility-administered medications for this visit.   Family History  Problem Relation Age of Onset  . Heart disease Father   . Diabetes Father   . Stroke Sister   . Anuerysm Sister   . Heart disease Brother   . Anuerysm Brother   . Cancer Daughter     breast ca    History   Social History  . Marital Status: Divorced    Spouse Name: N/A    Number of Children: N/A  . Years of Education: N/A   Social History Main Topics  . Smoking status: Never Smoker   . Smokeless tobacco: Never Used  . Alcohol Use: No  . Drug Use: No  . Sexual Activity: None   Other Topics Concern  . None   Social History Narrative   Review of Systems: Review of Systems  Constitutional: Negative for fever, chills, weight loss and malaise/fatigue.  Eyes: Negative for blurred vision.  Respiratory: Negative for cough and shortness of breath.   Cardiovascular: Negative for chest pain and leg swelling.  Gastrointestinal: Negative for abdominal pain.  Neurological: Negative for dizziness and headaches.     Objective:  Physical Exam: Filed Vitals:   02/15/14 1055  BP: 165/66  Pulse: 84  Temp: 98 F (36.7 C)  TempSrc: Oral  Height: 5\' 3"  (1.6 m)  Weight: 160 lb 8 oz (72.802 kg)  SpO2: 100%   Physical Exam  Constitutional: She is well-developed, well-nourished, and in no distress.  Cardiovascular: Normal rate, regular rhythm and normal heart sounds.   Pulmonary/Chest: Effort normal and breath sounds normal. She has no wheezes.  Abdominal: Soft. Bowel sounds are normal. She exhibits no distension. There is no tenderness.  Musculoskeletal: She exhibits no edema.  Nursing note and vitals reviewed.    Assessment & Plan:  Case discussed with Dr. Lynnae January  HTN, goal below 140/90 BP Readings from Last 3 Encounters:  02/15/14 165/66  01/17/14 161/57  01/10/14 165/68    Lab Results  Component Value Date   NA 140 12/09/2013   K 4.1 12/09/2013   CREATININE 0.93 12/09/2013    Assessment: Blood pressure control: moderately elevated Progress toward BP goal:  unchanged Comments: above goal for last 3 visits.  Plan: Medications:  Increase Triamterene-HCTZ to 1 tablet of 37.5-25mg  daily Educational resources provided: brochure Self management tools provided:    Other plans: no evidence of proteinuria, and SCr has improved.  Will hold off on starting ACEi.    Diabetes mellitus type 2 with neurological manifestations Lab Results  Component Value Date   HGBA1C 9.7 12/09/2013   HGBA1C 11.1 07/20/2013   HGBA1C 9.5 04/09/2013     Assessment: Diabetes control: poor control (HgbA1C >9%) Progress toward A1C goal:  unchanged Comments: patient not taking Novolog more than daily and having some low blood sugars with it.  Plan:  Medications:  Continue Levemir 45u daily, D/C Novolog prandial.  Continue Metformin but to increase complinance will change to extended release.  And will start 1mg  of Prandin TID Home glucose monitoring: Frequency: 3 times a day Timing: before meals Instruction/counseling given: reminded to get eye exam, reminded to bring blood glucose meter & log to each visit, reminded to bring medications to each visit and discussed diet Educational resources provided: brochure (saw Educator today) Self management tools provided: copy of home glucose meter download Other plans: follow up in 1-2 weeks to assess response of prandin.  If that is not effective we could consider VGo-40 as a possible option.       Medications Ordered Meds ordered this encounter  Medications  . repaglinide (PRANDIN) 1 MG tablet    Sig: Take 1 tablet (1 mg total) by mouth 3 (three) times daily before meals.    Dispense:  90 tablet    Refill:  2  . metFORMIN (GLUCOPHAGE XR) 500 MG 24 hr tablet    Sig: Take 2 tablets (1,000 mg total) by mouth 2 (two) times daily.    Dispense:  120 tablet    Refill:  11  . triamterene-hydrochlorothiazide (MAXZIDE-25) 37.5-25 MG per tablet    Sig: Take 1 tablet by mouth daily.    Dispense:  90 tablet    Refill:  3   Other Orders No orders of the defined types were placed in this encounter.

## 2014-02-16 NOTE — Progress Notes (Signed)
Internal Medicine Clinic Attending  Case discussed with Dr. Hoffman soon after the resident saw the patient.  We reviewed the resident's history and exam and pertinent patient test results.  I agree with the assessment, diagnosis, and plan of care documented in the resident's note. 

## 2014-02-17 ENCOUNTER — Other Ambulatory Visit: Payer: Self-pay | Admitting: Dietician

## 2014-02-17 DIAGNOSIS — E1149 Type 2 diabetes mellitus with other diabetic neurological complication: Secondary | ICD-10-CM

## 2014-02-17 NOTE — Progress Notes (Signed)
  Medical Nutrition Therapy:  Appt start time: 0930 end time:  1015  Assessment:  Primary concerns today: blood sugar control. Patient would like her blood sugars to be in better control, checks blood sugars about 1x/day. Is afraid of taking Novolog unless blood sugars are high enough, so skips it when CBGs are well controlled.  Meter download shows: average of 234 for past 90 days, with 111 readings, 8-9 low blood sugars during that period, better control on weekends and at night, standard deviation is 109 showing considerable variability MEDICATIONS:Always take levemir, takes Novolog 0 or 8 or 11 units one time a day, misses one dose of metformin most days DIETARY INTAKE: 24-hr recall was done and patient described reasonable intake, adequate fruits and vegetables and baked foods Beverages: water, tea  Recent physical activity: walks during ADLs and sometimes with daughter  Estimated daily energy needs: ~ 1400-1700 calories ~200 g carbohydrates   Progress Towards Goal(s):  In progress.   Nutritional Diagnosis:  [place]-2.2 Altered nutrition-related laboratory and [place]-2.4 Predicted food-medication interaction As related to lack of adequate blood sugar control.  As evidenced by meter download with average 234.    Intervention:  Nutrition education, health coaching about goal setting, diabetes medications and patients preferences regarding her diabetes self care.  coordination of care- discussed patient preferences and goals with physician Handouts given during visit include:AVs  Medication for diabetes Monitoring/Evaluation:  Dietary intake, exercise, meter, and body weight in 3 week(s).

## 2014-02-17 NOTE — Progress Notes (Unsigned)
Request referral for DSMT Follow up and MNT Follow up

## 2014-02-17 NOTE — Patient Instructions (Signed)
Repaglinide tablets What is this medicine? REPAGLINIDE (re PAG lin ide) helps to treat type 2 diabetes. It helps to control blood sugar. Treatment is combined with diet and exercise. This medicine may be used for other purposes; ask your health care provider or pharmacist if you have questions. COMMON BRAND NAME(S): Prandin  How should I use this medicine? Take this medicine by mouth with a glass of water. Follow the directions on the prescription label. The dose should be taken no earlier than 30 minutes before every meal. If an extra meal is added, take a tablet before that meal. If a meal is skipped, skip the dose for that meal. Do not take more often than directed.  Elderly patients over 4 years old may have a stronger reaction and need a smaller dose. Overdosage: If you think you have taken too much of this medicine contact a poison control center or emergency room at once. NOTE: This medicine is only for you. Do not share this medicine with others. What if I miss a dose? If you miss a dose before a meal, skip that dose. If it is almost time for your next dose, take only that dose with the next scheduled meal as directed. Do not take double or extra doses. What may interact with this medicine? -barbiturates like phenobarbital or primidone -carbamazepine -clarithromycin -erythromycin -gemfibrozil -isophane insulin, NPH -medicines for fungal or yeast infections such as itraconazole, ketoconazole, miconazole -montelukast -other medicines for diabetes -rifampin -simvastatin Many medications may cause an increase or decrease in blood sugar, these include: -alcohol containing beverages -aspirin and aspirin-like drugs -chloramphenicol -chromium -diuretics -female hormones, such as estrogens or progestins, birth control pills -heart medicines -isoniazid -female hormones or anabolic steroids -medications for weight loss -medicines for allergies, asthma, cold, or cough -medicines for  mental problems -medicines called MAO inhibitors - Nardil, Parnate, Marplan, Eldepryl -niacin -NSAIDS, such as ibuprofen -pentamidine -phenytoin -probenecid -quinolone antibiotics such as ciprofloxacin, levofloxacin, ofloxacin -some herbal dietary supplements -steroid medicines such as prednisone or cortisone -thyroid hormones This list may not describe all possible interactions. Give your health care provider a list of all the medicines, herbs, non-prescription drugs, or dietary supplements you use. Also tell them if you smoke, drink alcohol, or use illegal drugs. Some items may interact with your medicine.  What should I watch for while using this medicine? Visit your doctor or health care professional for regular checks on your progress. A test called the HbA1C (A1C) will be monitored. This is a simple blood test. It measures your blood sugar control over the last 2 to 3 months. You will receive this test every 3 to 6 months. Learn how to check your blood sugar. Learn the symptoms of low and high blood sugar and how to manage them. Always carry a quick-source of sugar with you in case you have symptoms of low blood sugar. Examples include hard sugar candy or glucose tablets. Make sure others know that you can choke if you eat or drink when you develop serious symptoms of low blood sugar, such as seizures or unconsciousness. They must get medical help at once. Tell your doctor or health care professional if you have high blood sugar. You might need to change the dose of your medicine. If you are sick or exercising more than usual, you might need to change the dose of your medicine. Do not skip meals. Ask your doctor or health care professional if you should avoid alcohol. Many nonprescription cough and cold products contain sugar  or alcohol. These can affect blood sugar. Wear a medical ID bracelet or chain, and carry a card that describes your disease and details of your medicine and dosage  times.  What side effects may I notice from receiving this medicine? Side effects that you should report to your doctor or health care professional as soon as possible: -allergic reactions like skin rash, itching or hives, swelling of the face, lips, or tongue -breathing difficulties -dark urine -fever, chills -signs and symptoms of low blood sugar such as feeling anxious, confusion, dizziness, increased hunger, unusually weak or tired, sweating, shakiness, cold, irritable, headache, blurred vision, fast heartbeat, loss of consciousness -vomiting -yellowing of the eyes or skin Side effects that usually do not require medical attention (report to your doctor or health care professional if they continue or are bothersome): -back pain -diarrhea -headache -joint pain -nausea This list may not describe all possible side effects. Call your doctor for medical advice about side effects. You may report side effects to FDA at 1-800-FDA-1088.

## 2014-02-20 ENCOUNTER — Other Ambulatory Visit: Payer: Self-pay | Admitting: Internal Medicine

## 2014-02-25 ENCOUNTER — Ambulatory Visit: Payer: Medicare Other | Admitting: Internal Medicine

## 2014-02-28 ENCOUNTER — Encounter: Payer: Self-pay | Admitting: Internal Medicine

## 2014-02-28 ENCOUNTER — Ambulatory Visit (INDEPENDENT_AMBULATORY_CARE_PROVIDER_SITE_OTHER): Payer: Medicare Other | Admitting: Internal Medicine

## 2014-02-28 VITALS — BP 137/71 | HR 102 | Temp 99.0°F | Ht 63.0 in | Wt 159.4 lb

## 2014-02-28 DIAGNOSIS — E1140 Type 2 diabetes mellitus with diabetic neuropathy, unspecified: Secondary | ICD-10-CM

## 2014-02-28 DIAGNOSIS — E1149 Type 2 diabetes mellitus with other diabetic neurological complication: Secondary | ICD-10-CM

## 2014-02-28 DIAGNOSIS — I10 Essential (primary) hypertension: Secondary | ICD-10-CM

## 2014-02-28 NOTE — Progress Notes (Signed)
INTERNAL MEDICINE TEACHING ATTENDING ADDENDUM - Leta Bucklin, MD: I reviewed and discussed at the time of visit with the resident Dr. Sadek, the patient's medical history, physical examination, diagnosis and results of pertinent tests and treatment and I agree with the patient's care as documented.  

## 2014-02-28 NOTE — Progress Notes (Signed)
Subjective:   Patient ID: [name] female   DOB: [birth year] 64 y.o.   MRN: [id number]  HPI: [name] is a 64 y.o. woman with a past medical history as listed below presents for diabetes recheck. During her last evaluation on 02/15/14 the patient was told to discontinue her NovoLog but continue with metformin and start 1 mg of Prandin 3 times a day along with Levemir 45 units daily. Since that time the patient states that she has been compliant with these changes. Patient checking blood sugars 2-3 times daily, before breakfast and dinner. One hypoglycemic episodes since last visit this happened because the patient had taken her morning medications and then fell asleep without eating her meal. She was symptomatic with the episode with his CBG reading of 68 and felt the shakes this quickly resolved with drinking orange juice she has not had a repeat episode since then. denies polyuria, polydipsia, nausea, vomiting, diarrhea.  does not request refills today.   Past Medical History  Diagnosis Date  . Allergy   . Anemia   . Depression   . Arthritis   . Cancer   . Cataract   . Diabetes mellitus without complication   . Glaucoma   . Hyperlipidemia   . Hypertension   . Chronic kidney disease   . Neuromuscular disorder   . Thyroid disease   . Left arm pain 07/28/2012  . BREAST CANCER, HX OF 04/16/2006    Qualifier: Diagnosis of  By: Leward Quan MD, Pamala Hurry    . Diverticulosis    Current Outpatient Prescriptions  Medication Sig Dispense Refill  . acetaminophen (TYLENOL) 325 MG tablet Take 325 mg by mouth every 8 (eight) hours as needed (pain).    [name] aspirin EC 81 MG tablet Take 81 mg by mouth at bedtime.    . B-D ULTRAFINE III SHORT PEN 31G X 8 MM MISC     . Cholecalciferol (VITAMIN D-3) 1000 UNITS CAPS Take 1,000 Units by mouth daily.    . clobetasol cream (TEMOVATE) 3.29 % Apply 1 application topically 2 (two) times daily as needed (rash).     . cycloSPORINE (RESTASIS) 0.05 %  ophthalmic emulsion Place 1 drop into both eyes 2 (two) times daily.    [name] gabapentin (NEURONTIN) 100 MG capsule TAKE 1 CAPSULE BY MOUTH EVERY NIGHT AT BEDTIME 30 capsule 3  . glucose 4 GM chewable tablet Chew 4 tablets (16 g total) by mouth as needed for low blood sugar. 50 tablet 12  . Insulin Detemir (LEVEMIR FLEXTOUCH) 100 UNIT/ML Pen Inject 45 Units into the skin at bedtime. 15 mL 6  . Insulin Pen Needle (BD PEN NEEDLE NANO U/F) 32G X 4 MM MISC Use as directed 100 each 11  . Insulin Syringe-Needle U-100 (B-D INS SYR ULTRAFINE .5CC/30G) 30G X 1/2" 0.5 ML MISC To use 4 times daily for injecting insulin. diag code 250.42. Insulin dependent 150 each 6  . levothyroxine (SYNTHROID, LEVOTHROID) 75 MCG tablet TAKE 1 TABLET BY MOUTH DAILY BEFORE BREAKFAST 30 tablet 2  . meloxicam (MOBIC) 15 MG tablet TAKE 1 TABLET BY MOUTH DAILY AS NEEDED FOR PAIN 30 tablet 0  . metFORMIN (GLUCOPHAGE XR) 500 MG 24 hr tablet Take 2 tablets (1,000 mg total) by mouth 2 (two) times daily. 120 tablet 11  . olopatadine (PATANOL) 0.1 % ophthalmic solution Place 1 drop into both eyes 2 (two) times daily.     . Polyvinyl Alcohol-Povidone (REFRESH OP) Place 1 drop into both eyes 3 (three) times  daily.    . pravastatin (PRAVACHOL) 40 MG tablet TAKE 1 TABLET BY MOUTH DAILY 30 tablet 3  . repaglinide (PRANDIN) 1 MG tablet Take 1 tablet (1 mg total) by mouth 3 (three) times daily before meals. 90 tablet 2  . terbinafine (LAMISIL) 250 MG tablet Take 1 tablet (250 mg total) by mouth daily. 120 tablet 0  . Travoprost, BAK Free, (TRAVATAN) 0.004 % SOLN ophthalmic solution Place 1 drop into both eyes at bedtime.     . triamterene-hydrochlorothiazide (MAXZIDE-25) 37.5-25 MG per tablet Take 1 tablet by mouth daily. 90 tablet 3  . venlafaxine XR (EFFEXOR-XR) 75 MG 24 hr capsule TAKE 1 CAPSULE BY MOUTH EVERY DAY 30 capsule 1  . zoster vaccine live, PF, (ZOSTAVAX) 30160 UNT/0.65ML injection Inject 19,400 Units into the skin once. 1 each 0    No current facility-administered medications for this visit.   Family History  Problem Relation Age of Onset  . Heart disease Father   . Diabetes Father   . Stroke Sister   . Anuerysm Sister   . Heart disease Brother   . Anuerysm Brother   . Cancer Daughter     breast ca   History   Social History  . Marital Status: Divorced    Spouse Name: N/A    Number of Children: N/A  . Years of Education: N/A   Social History Main Topics  . Smoking status: Never Smoker   . Smokeless tobacco: Never Used  . Alcohol Use: No  . Drug Use: No  . Sexual Activity: None   Other Topics Concern  . None   Social History Narrative   Review of Systems: Pertinent items are noted in HPI. Objective:  Physical Exam: Filed Vitals:   02/28/14 1402  BP: 137/71  Pulse: 102  Temp: 99 F (37.2 C)  TempSrc: Oral  Height: 5\' 3"  (1.6 m)  Weight: 159 lb 6.4 oz (72.303 kg)  SpO2: 100%   General: Sitting in chair, NAD HEENT: PERRL, EOMI, no scleral icterus Cardiac: RRR, no rubs, murmurs or gallops Pulm: clear to auscultation bilaterally, moving normal volumes of air Abd: soft, nontender, nondistended, BS present Ext: warm and well perfused, no pedal edema Neuro: alert and oriented X3, cranial nerves II-XII grossly intact  Assessment & Plan:  Please see problem oriented charting  Pt discussed with Dr. Dareen Piano

## 2014-02-28 NOTE — Assessment & Plan Note (Signed)
Lab Results  Component Value Date   HGBA1C 9.7 12/09/2013   HGBA1C 11.1 07/20/2013   HGBA1C 9.5 04/09/2013     Assessment: Diabetes control:   Progress toward A1C goal:    Comments:   Plan: Medications:  We will continue her metformin 1000 mg twice a day, the patient will continue Prandin 1 mg with meals which is twice a day for the patient and increase Levemir to 50 units daily at bedtime Home glucose monitoring: Frequency:   Timing:   Instruction/counseling given: reminded to get eye exam, reminded to bring blood glucose meter & log to each visit and reminded to bring medications to each visit Educational resources provided:   Self management tools provided:   Other plans: Review of the patient's CBG readings showed an average of 241, only one hypoglycemic episode since recent changes on 02/15/14 secondary to not eating after taking oral medications.

## 2014-02-28 NOTE — Assessment & Plan Note (Signed)
BP Readings from Last 3 Encounters:  02/28/14 137/71  02/15/14 165/66  01/17/14 161/57    Lab Results  Component Value Date   NA 140 12/09/2013   K 4.1 12/09/2013   CREATININE 0.93 12/09/2013    Assessment: Blood pressure control:   Progress toward BP goal:    Comments:   Plan: Medications:  Continue Maxide at 37.5-25 mg daily Educational resources provided:   Self management tools provided:   Other plans: Patient is now at goal

## 2014-02-28 NOTE — Patient Instructions (Signed)
General Instructions:   Thank you for bringing your medicines today. This helps [place] keep you safe from mistakes.  For your diabetes we will make the following changes: -Continue to stop taking the Novolin -Increase your Levemir to 50 units at bedtime -Continue taking the metformin 1000 mg (2 pills) twice a day -Continue to take the new medicine Prandin 1 mg only with your meals (which is 2 times a day for you)  -Continue to check your blood sugars 2-3 times a day -We will see you in one month to follow-up -If you continue to have low blood sugars decrease your Levemir to 45 units at night again and come in and see [place]  Progress Toward Treatment Goals:  Treatment Goal 02/15/2014  Hemoglobin A1C unchanged  Blood pressure unchanged  Prevent falls unable to assess    Self Care Goals & Plans:  Self Care Goal 02/15/2014  Manage my medications take my medicines as prescribed; bring my medications to every visit; refill my medications on time  Monitor my health keep track of my blood glucose; bring my glucose meter and log to each visit  Eat healthy foods drink diet soda or water instead of juice or soda; eat more vegetables; eat foods that are low in salt; eat baked foods instead of fried foods; eat fruit for snacks and desserts  Be physically active find an activity I enjoy  Meeting treatment goals maintain the current self-care plan    Home Blood Glucose Monitoring 02/15/2014  Check my blood sugar 3 times a day  When to check my blood sugar before meals     Care Management & Community Referrals:  Referral 06/17/2013  Referrals made for care management support diabetes educator  Referrals made to community resources -

## 2014-03-08 ENCOUNTER — Other Ambulatory Visit: Payer: Self-pay | Admitting: Internal Medicine

## 2014-03-10 ENCOUNTER — Other Ambulatory Visit: Payer: Self-pay | Admitting: Internal Medicine

## 2014-03-10 DIAGNOSIS — E1165 Type 2 diabetes mellitus with hyperglycemia: Secondary | ICD-10-CM

## 2014-03-10 DIAGNOSIS — IMO0002 Reserved for concepts with insufficient information to code with codable children: Secondary | ICD-10-CM

## 2014-03-14 ENCOUNTER — Other Ambulatory Visit: Payer: Self-pay | Admitting: Internal Medicine

## 2014-03-23 ENCOUNTER — Telehealth: Payer: Self-pay | Admitting: Internal Medicine

## 2014-03-23 NOTE — Telephone Encounter (Signed)
Call to patient to confirm appointment for 03/24/14 at 8:45 lmtcb

## 2014-03-24 ENCOUNTER — Encounter: Payer: Self-pay | Admitting: Internal Medicine

## 2014-03-24 ENCOUNTER — Ambulatory Visit (INDEPENDENT_AMBULATORY_CARE_PROVIDER_SITE_OTHER): Payer: Medicare Other | Admitting: Internal Medicine

## 2014-03-24 VITALS — BP 151/69 | HR 95 | Temp 97.6°F | Ht 63.5 in | Wt 156.6 lb

## 2014-03-24 DIAGNOSIS — Z794 Long term (current) use of insulin: Secondary | ICD-10-CM | POA: Diagnosis not present

## 2014-03-24 DIAGNOSIS — E039 Hypothyroidism, unspecified: Secondary | ICD-10-CM | POA: Diagnosis not present

## 2014-03-24 DIAGNOSIS — M6740 Ganglion, unspecified site: Secondary | ICD-10-CM | POA: Insufficient documentation

## 2014-03-24 DIAGNOSIS — E1140 Type 2 diabetes mellitus with diabetic neuropathy, unspecified: Secondary | ICD-10-CM | POA: Diagnosis not present

## 2014-03-24 DIAGNOSIS — Z9111 Patient's noncompliance with dietary regimen: Secondary | ICD-10-CM

## 2014-03-24 DIAGNOSIS — M67431 Ganglion, right wrist: Secondary | ICD-10-CM | POA: Diagnosis not present

## 2014-03-24 DIAGNOSIS — E1122 Type 2 diabetes mellitus with diabetic chronic kidney disease: Secondary | ICD-10-CM

## 2014-03-24 DIAGNOSIS — E1149 Type 2 diabetes mellitus with other diabetic neurological complication: Secondary | ICD-10-CM

## 2014-03-24 DIAGNOSIS — E1165 Type 2 diabetes mellitus with hyperglycemia: Secondary | ICD-10-CM

## 2014-03-24 DIAGNOSIS — I129 Hypertensive chronic kidney disease with stage 1 through stage 4 chronic kidney disease, or unspecified chronic kidney disease: Secondary | ICD-10-CM

## 2014-03-24 DIAGNOSIS — I10 Essential (primary) hypertension: Secondary | ICD-10-CM

## 2014-03-24 DIAGNOSIS — N189 Chronic kidney disease, unspecified: Secondary | ICD-10-CM | POA: Diagnosis not present

## 2014-03-24 DIAGNOSIS — N183 Chronic kidney disease, stage 3 unspecified: Secondary | ICD-10-CM

## 2014-03-24 LAB — BASIC METABOLIC PANEL WITH GFR
BUN: 33 mg/dL — ABNORMAL HIGH (ref 6–23)
CO2: 25 mEq/L (ref 19–32)
Calcium: 9.4 mg/dL (ref 8.4–10.5)
Chloride: 101 mEq/L (ref 96–112)
Creat: 1.56 mg/dL — ABNORMAL HIGH (ref 0.50–1.10)
GFR, Est African American: 40 mL/min — ABNORMAL LOW
GFR, Est Non African American: 35 mL/min — ABNORMAL LOW
Glucose, Bld: 268 mg/dL — ABNORMAL HIGH (ref 70–99)
Potassium: 4.4 mEq/L (ref 3.5–5.3)
Sodium: 137 mEq/L (ref 135–145)

## 2014-03-24 LAB — POCT GLYCOSYLATED HEMOGLOBIN (HGB A1C): Hemoglobin A1C: 10.4

## 2014-03-24 LAB — TSH: TSH: 2.71 u[IU]/mL (ref 0.350–4.500)

## 2014-03-24 LAB — GLUCOSE, CAPILLARY: Glucose-Capillary: 304 mg/dL — ABNORMAL HIGH (ref 70–99)

## 2014-03-24 MED ORDER — INSULIN DETEMIR 100 UNIT/ML FLEXPEN: 55.0000 [IU] | PEN_INJECTOR | Freq: Every day | SUBCUTANEOUS | Status: DC

## 2014-03-24 MED ORDER — PRAVASTATIN SODIUM 40 MG PO TABS: 40.0000 mg | ORAL_TABLET | Freq: Every day | ORAL | Status: DC

## 2014-03-24 NOTE — Patient Instructions (Signed)
- It was a pleasure seeing you today - I have increased your levemir to 55 units - Your A1C is elevated and you will need to be compliant with your diet and exercise - I have referred you to orthopedics for the cyst on your wrist - Please follow up in 4 weeks  Diabetes Mellitus and Food It is important for you to manage your blood sugar (glucose) level. Your blood glucose level can be greatly affected by what you eat. Eating healthier foods in the appropriate amounts throughout the day at about the same time each day will help you control your blood glucose level. It can also help slow or prevent worsening of your diabetes mellitus. Healthy eating may even help you improve the level of your blood pressure and reach or maintain a healthy weight.  HOW CAN FOOD AFFECT ME? Carbohydrates Carbohydrates affect your blood glucose level more than any other type of food. Your dietitian will help you determine how many carbohydrates to eat at each meal and teach you how to count carbohydrates. Counting carbohydrates is important to keep your blood glucose at a healthy level, especially if you are using insulin or taking certain medicines for diabetes mellitus. Alcohol Alcohol can cause sudden decreases in blood glucose (hypoglycemia), especially if you use insulin or take certain medicines for diabetes mellitus. Hypoglycemia can be a life-threatening condition. Symptoms of hypoglycemia (sleepiness, dizziness, and disorientation) are similar to symptoms of having too much alcohol.  If your health care provider has given you approval to drink alcohol, do so in moderation and use the following guidelines:  Women should not have more than one drink per day, and men should not have more than two drinks per day. One drink is equal to:  12 oz of beer.  5 oz of wine.  1 oz of hard liquor.  Do not drink on an empty stomach.  Keep yourself hydrated. Have water, diet soda, or unsweetened iced tea.  Regular  soda, juice, and other mixers might contain a lot of carbohydrates and should be counted. WHAT FOODS ARE NOT RECOMMENDED? As you make food choices, it is important to remember that all foods are not the same. Some foods have fewer nutrients per serving than other foods, even though they might have the same number of calories or carbohydrates. It is difficult to get your body what it needs when you eat foods with fewer nutrients. Examples of foods that you should avoid that are high in calories and carbohydrates but low in nutrients include:  Trans fats (most processed foods list trans fats on the Nutrition Facts label).  Regular soda.  Juice.  Candy.  Sweets, such as cake, pie, doughnuts, and cookies.  Fried foods. WHAT FOODS CAN I EAT? Have nutrient-rich foods, which will nourish your body and keep you healthy. The food you should eat also will depend on several factors, including:  The calories you need.  The medicines you take.  Your weight.  Your blood glucose level.  Your blood pressure level.  Your cholesterol level. You also should eat a variety of foods, including:  Protein, such as meat, poultry, fish, tofu, nuts, and seeds (lean animal proteins are best).  Fruits.  Vegetables.  Dairy products, such as milk, cheese, and yogurt (low fat is best).  Breads, grains, pasta, cereal, rice, and beans.  Fats such as olive oil, trans fat-free margarine, canola oil, avocado, and olives. DOES EVERYONE WITH DIABETES MELLITUS HAVE THE SAME MEAL PLAN? Because every person  with diabetes mellitus is different, there is not one meal plan that works for everyone. It is very important that you meet with a dietitian who will help you create a meal plan that is just right for you. Document Released: 10/11/2004 Document Revised: 01/19/2013 Document Reviewed: 12/11/2012 [facility] Patient Information 2015 [place], [place]. This information is not intended to replace advice given to you by  your health care provider. Make sure you discuss any questions you have with your health care provider.

## 2014-03-24 NOTE — Assessment & Plan Note (Signed)
Lab Results  Component Value Date   HGBA1C 10.4 03/24/2014   HGBA1C 9.7 12/09/2013   HGBA1C 11.1 07/20/2013     Assessment: Diabetes control:  poor Progress toward A1C goal:   deteriorated Comments: pt is non compliant with her diet  Plan: Medications: levemir increased to 55 units from 50 units Home glucose monitoring: Frequency:   Timing:   Instruction/counseling given: reminded to bring blood glucose meter & log to each visit, reminded to bring medications to each visit and discussed diet Educational resources provided: brochure (denies) Self management tools provided: copy of home glucose meter download Other plans: Patient to check BS 3-4 times a day for the next 2 weeks and follow up in the clinic. Will check BMP today

## 2014-03-24 NOTE — Assessment & Plan Note (Signed)
-   last TSH wnl. Will recheck today - c/w current dose of synthroid for now

## 2014-03-24 NOTE — Progress Notes (Signed)
   Subjective:    Patient ID: [name], female    DOB: 08-01-1950, 64 y.o.   MRN: [id number]  HPI Pt is here for routine follow up.   Pt has been here in the past couple of months for uncontrolled BS. She states she has not been compliant with her diet but has been taking her medications. She did notice highs in the 400s as well as 2-3 lows (lowest was 60s). States she is uncertain why her BS fluctuate so much.  She also complains of persistence of a swelling over the plantar aspect of her right cyst and would like to follow up with ortho for possible removal  She also requests a refill of her pravastatin  She is compliant with her thyroid medication and had a normal TSH on last visit   Review of Systems  Constitutional: Negative.   HENT: Negative.   Respiratory: Negative.   Cardiovascular: Negative.   Gastrointestinal: Negative.   Genitourinary: Negative.   Musculoskeletal: Negative.   Skin: Negative.   Neurological: Negative.        Objective:   Physical Exam  Constitutional: She is oriented to person, place, and time. She appears well-developed and well-nourished.  HENT:  Head: Normocephalic and atraumatic.  Eyes: Conjunctivae are normal.  Neck: Normal range of motion. Neck supple.  Cardiovascular: Normal rate, regular rhythm and normal heart sounds.   No murmur heard. Pulmonary/Chest: Effort normal and breath sounds normal. No respiratory distress. She has no wheezes.  Abdominal: Soft. Bowel sounds are normal. She exhibits no distension. There is no tenderness.  Musculoskeletal: Normal range of motion. She exhibits no edema or tenderness.  Neurological: She is alert and oriented to person, place, and time.  Skin: Skin is warm and dry.  Psychiatric: She has a normal mood and affect. Her behavior is normal.          Assessment & Plan:  Please see problem based charting for assessment and plan:

## 2014-03-24 NOTE — Assessment & Plan Note (Signed)
-   Will recheck BMP today. Will add ACE-I to current regimen if Cr is stable

## 2014-03-24 NOTE — Assessment & Plan Note (Signed)
BP Readings from Last 3 Encounters:  03/24/14 151/69  02/28/14 137/71  02/15/14 165/66    Lab Results  Component Value Date   NA 140 12/09/2013   K 4.1 12/09/2013   CREATININE 0.93 12/09/2013    Assessment: Blood pressure control:  fair Progress toward BP goal:   deteriorated Comments:   Plan: Medications:  continue current medications Educational resources provided: brochure (denies) Self management tools provided: home blood pressure logbook Other plans: Pt to maintain BP log and check her BP at home

## 2014-03-24 NOTE — Assessment & Plan Note (Signed)
-   pt states that the cust on her right wrist has increased in size and she would like to get it removed - Referral to ortho given - Cyst remains painless

## 2014-03-28 ENCOUNTER — Telehealth: Payer: Self-pay | Admitting: Internal Medicine

## 2014-03-28 MED ORDER — AMLODIPINE BESYLATE 5 MG PO TABS: 5.0000 mg | ORAL_TABLET | Freq: Every day | ORAL | Status: DC

## 2014-03-28 NOTE — Telephone Encounter (Signed)
Called patient to discuss blood work. Explained to patient that her creatinine is elevated and this is possibly secondary to her BP medication. Asked patient to discontinue triamterene/HCTZ and start amlodipine 5 mg and follow up this Friday at [facility] for BP check and repeat BMET and urine studies (calculate FeNa). She expresses understanding.

## 2014-03-28 NOTE — Telephone Encounter (Signed)
LOV 01/27/2014 with Dr. Shirley Muscat being faxed

## 2014-03-29 ENCOUNTER — Encounter: Payer: Self-pay | Admitting: *Deleted

## 2014-04-01 ENCOUNTER — Encounter: Payer: Self-pay | Admitting: Internal Medicine

## 2014-04-01 ENCOUNTER — Ambulatory Visit (INDEPENDENT_AMBULATORY_CARE_PROVIDER_SITE_OTHER): Payer: Medicare Other | Admitting: Internal Medicine

## 2014-04-01 VITALS — BP 156/77 | HR 95 | Temp 97.5°F | Ht 63.0 in | Wt 157.5 lb

## 2014-04-01 DIAGNOSIS — N179 Acute kidney failure, unspecified: Secondary | ICD-10-CM | POA: Insufficient documentation

## 2014-04-01 DIAGNOSIS — I10 Essential (primary) hypertension: Secondary | ICD-10-CM

## 2014-04-01 LAB — URINE MICROSCOPIC-ADD ON

## 2014-04-01 LAB — URINALYSIS, ROUTINE W REFLEX MICROSCOPIC
Bilirubin Urine: NEGATIVE
Glucose, UA: >1000 mg/dL — AB
Hgb urine dipstick: NEGATIVE
Ketones, ur: NEGATIVE mg/dL
Leukocytes, UA: NEGATIVE
Nitrite: NEGATIVE
Protein, ur: NEGATIVE mg/dL
Specific Gravity, Urine: 1.016 (ref 1.005–1.030)
Urobilinogen, UA: 0.2 mg/dL (ref 0.0–1.0)
pH: 6.5 (ref 5.0–8.0)

## 2014-04-01 LAB — BASIC METABOLIC PANEL
Anion gap: 7 (ref 5–15)
BUN: 20 mg/dL (ref 6–23)
CO2: 28 mmol/L (ref 19–32)
Calcium: 9.6 mg/dL (ref 8.4–10.5)
Chloride: 105 mmol/L (ref 96–112)
Creatinine, Ser: 1.09 mg/dL (ref 0.50–1.10)
GFR calc Af Amer: 61 mL/min — ABNORMAL LOW (ref >90)
GFR calc non Af Amer: 53 mL/min — ABNORMAL LOW (ref >90)
Glucose, Bld: 263 mg/dL — ABNORMAL HIGH (ref 70–99)
Potassium: 4.2 mmol/L (ref 3.5–5.1)
Sodium: 140 mmol/L (ref 135–145)

## 2014-04-01 LAB — CREATININE, URINE, RANDOM: Creatinine, Urine: 63.2 mg/dL

## 2014-04-01 LAB — SODIUM, URINE, RANDOM: Sodium, Ur: 81 mEq/L

## 2014-04-01 MED ORDER — AMLODIPINE BESYLATE 10 MG PO TABS: 10.0000 mg | ORAL_TABLET | Freq: Every day | ORAL | Status: DC

## 2014-04-01 NOTE — Assessment & Plan Note (Signed)
BP Readings from Last 3 Encounters:  04/01/14 156/77  03/24/14 151/69  02/28/14 137/71    Lab Results  Component Value Date   NA 137 03/24/2014   K 4.4 03/24/2014   CREATININE 1.56* 03/24/2014    Assessment: Blood pressure control:  not controlled Progress toward BP goal:   not at goal Comments: She has been off maxide as taking amlodipine 5mg  daily.   Plan: Medications:  increase almodipine to 10mg  daily Educational resources provided:   Self management tools provided:   Other plans: She will record her BP readings and bring this info in 2 weeks.

## 2014-04-01 NOTE — Patient Instructions (Signed)
General Instructions: -Start taking amlodipine 10mg  daily.  -Check your blood pressure at least once per day after you have rested for at least 15 minutes, write down this information on your lod sheet and bring this with you to your next visit.  -Follow up with [place] in 2 weeks.    Thank you for bringing your medicines today. This helps [place] keep you safe from mistakes.   Progress Toward Treatment Goals:  Treatment Goal 02/15/2014  Hemoglobin A1C unchanged  Blood pressure unchanged  Prevent falls unable to assess    Self Care Goals & Plans:  Self Care Goal 03/24/2014  Manage my medications take my medicines as prescribed; bring my medications to every visit; refill my medications on time  Monitor my health -  Eat healthy foods drink diet soda or water instead of juice or soda; eat more vegetables; eat foods that are low in salt; eat baked foods instead of fried foods; eat fruit for snacks and desserts  Be physically active -  Meeting treatment goals -    Home Blood Glucose Monitoring 02/15/2014  Check my blood sugar 3 times a day  When to check my blood sugar before meals     Care Management & Community Referrals:  Referral 06/17/2013  Referrals made for care management support diabetes educator  Referrals made to community resources -

## 2014-04-01 NOTE — Progress Notes (Signed)
   Subjective:    Patient ID: [name], female    DOB: [birth year], 64 y.o.   MRN: [id number]  HPI [name] is a 64 yr old woman with PMH of DM2, HTN, presenting for evaluation sudden increase in Cr. She was seen on 2/25 at the [facility] and found to have Cr of 1.56 on routine labs. Her baseline Cr is .9-1. She states that she had started a full tablet of Maxide (v half tablet) on 2/1 as instructed. She reports that since Dr. Wilber Bihari call she has stopped taking Maxide and is taking Norvasc 5mg  daily.   She has a BP machine at home and reports SBP in the 150 range, she forgot to bring her lod sheet.  She reports normal intake per mouth recently ant last week with no N/V/D.   Review of Systems  Constitutional: Negative for fever, chills, diaphoresis, activity change, appetite change, fatigue and unexpected weight change.  Respiratory: Negative for cough and shortness of breath.   Cardiovascular: Negative for chest pain, palpitations and leg swelling.  Gastrointestinal: Negative for vomiting and diarrhea.  Genitourinary: Negative for dysuria and difficulty urinating.  Neurological: Negative for dizziness and light-headedness.  Psychiatric/Behavioral: Negative for agitation.       Objective:   Physical Exam  Constitutional: She is oriented to person, place, and time. She appears well-developed and well-nourished. No distress.  Cardiovascular: Normal rate.   Pulmonary/Chest: Effort normal. No respiratory distress. She has no wheezes. She has no rales.  Abdominal: Soft. There is no tenderness.  Musculoskeletal: She exhibits no edema.  Neurological: She is alert and oriented to person, place, and time.  Skin: Skin is warm and dry. She is not diaphoretic.  Psychiatric: She has a normal mood and affect.  Nursing note and vitals reviewed.         Assessment & Plan:

## 2014-04-01 NOTE — Assessment & Plan Note (Addendum)
Cr of 1.56 on 03/24/14  from .9 BL. Likely due to recent increase in maxide dose.  Maxide has been discontinued for now.  Increase amlodipine to 10mg  daily but monitor for hand lymphedema -Repeat BMET STAT, UA and FeNa (please see Dr. Wilber Bihari telephone note from 2/26)  Addendum: Pt's BMET with nl Cr, glucose slightly elevated at 263, ua with >1000 glucose but otherwise unremarkable. Pt called and notified of results, briefly counseled on avoidance of sugary drinks and sweets.

## 2014-04-05 NOTE — Progress Notes (Signed)
Internal Medicine Clinic Attending  Case discussed with Dr. Kennerly soon after the resident saw the patient.  We reviewed the resident's history and exam and pertinent patient test results.  I agree with the assessment, diagnosis, and plan of care documented in the resident's note.  

## 2014-04-07 NOTE — Addendum Note (Signed)
Addended by: Hulan Fray on: 04/07/2014 06:34 AM   Modules accepted: Orders

## 2014-04-08 ENCOUNTER — Ambulatory Visit: Payer: Medicare Other | Admitting: Podiatry

## 2014-04-08 DIAGNOSIS — H2511 Age-related nuclear cataract, right eye: Secondary | ICD-10-CM | POA: Diagnosis not present

## 2014-04-08 DIAGNOSIS — H2512 Age-related nuclear cataract, left eye: Secondary | ICD-10-CM | POA: Diagnosis not present

## 2014-04-08 DIAGNOSIS — H18411 Arcus senilis, right eye: Secondary | ICD-10-CM | POA: Diagnosis not present

## 2014-04-08 DIAGNOSIS — H18412 Arcus senilis, left eye: Secondary | ICD-10-CM | POA: Diagnosis not present

## 2014-04-11 ENCOUNTER — Encounter: Payer: Medicare Other | Admitting: Dietician

## 2014-04-13 ENCOUNTER — Ambulatory Visit (INDEPENDENT_AMBULATORY_CARE_PROVIDER_SITE_OTHER): Payer: Medicare Other | Admitting: Internal Medicine

## 2014-04-13 ENCOUNTER — Encounter: Payer: Self-pay | Admitting: Internal Medicine

## 2014-04-13 ENCOUNTER — Encounter: Payer: Medicare Other | Admitting: Dietician

## 2014-04-13 VITALS — BP 142/76 | HR 92 | Temp 98.8°F | Ht 63.0 in | Wt 159.9 lb

## 2014-04-13 DIAGNOSIS — M6740 Ganglion, unspecified site: Secondary | ICD-10-CM

## 2014-04-13 DIAGNOSIS — E1165 Type 2 diabetes mellitus with hyperglycemia: Secondary | ICD-10-CM

## 2014-04-13 DIAGNOSIS — M67431 Ganglion, right wrist: Secondary | ICD-10-CM | POA: Diagnosis not present

## 2014-04-13 DIAGNOSIS — I10 Essential (primary) hypertension: Secondary | ICD-10-CM

## 2014-04-13 DIAGNOSIS — Z794 Long term (current) use of insulin: Secondary | ICD-10-CM

## 2014-04-13 DIAGNOSIS — E1140 Type 2 diabetes mellitus with diabetic neuropathy, unspecified: Secondary | ICD-10-CM | POA: Diagnosis not present

## 2014-04-13 LAB — GLUCOSE, CAPILLARY: Glucose-Capillary: 91 mg/dL (ref 70–99)

## 2014-04-13 MED ORDER — ZOSTER VACCINE LIVE 19400 UNT/0.65ML ~~LOC~~ SOLR: 0.6500 mL | Freq: Once | SUBCUTANEOUS | Status: DC

## 2014-04-13 MED ORDER — HYDROCHLOROTHIAZIDE 12.5 MG PO CAPS: 12.5000 mg | ORAL_CAPSULE | Freq: Every day | ORAL | Status: DC

## 2014-04-13 NOTE — Patient Instructions (Signed)
-   It was a pleasure seeing you again - Please continue to take your amlodipine 10 mg daily - I will also start a diuretic - Hydrochlorthiazide 12.5 mg daily - Please follow up in 1 month for repeat blood work - Your blood sugars are still elevated. Please continue with the diet and exercise and current medications

## 2014-04-13 NOTE — Assessment & Plan Note (Signed)
-   Patient with likely ganglion cyst over R wrist. States it has been slowly increasing in size - she has an appointment to follow up with orthopedics for possible removal

## 2014-04-13 NOTE — Assessment & Plan Note (Addendum)
Lab Results  Component Value Date   HGBA1C 10.4 03/24/2014   HGBA1C 9.7 12/09/2013   HGBA1C 11.1 07/20/2013     Assessment: Diabetes control:  poorly controlled Progress toward A1C goal:   worsened Comments: Patient still with poorly controlled diabetes. She had her insulin increased on her last visit with me in February. Will not change at current time Plan: Medications:  continue current medications Home glucose monitoring: Frequency:   Timing:   Instruction/counseling given: reminded to bring blood glucose meter & log to each visit Educational resources provided: brochure (denies) Self management tools provided: copy of home glucose meter download Other plans: Follow up in 3 months. Patient is now attempting to exercise and follow her diet since her last visit

## 2014-04-13 NOTE — Assessment & Plan Note (Signed)
BP Readings from Last 3 Encounters:  04/13/14 142/76  04/01/14 156/77  03/24/14 151/69    Lab Results  Component Value Date   NA 140 04/01/2014   K 4.2 04/01/2014   CREATININE 1.09 04/01/2014    Assessment: Blood pressure control:  fair Progress toward BP goal:   improved Comments: patient had her norvasc recently increased to 10 mg for better BP control. She has noticed mild LE edema since stopping her diuretics.   Plan: Medications:  will add HCTZ 12.5 mg given mild LE edema and mildly elevated BP Educational resources provided: brochure (denies) Self management tools provided:   Other plans: Would recheck BMET on next visit since we restarted her diuretics.  Would also consider starting her on an ACE- I and stopping her norvasc if she develops proteinuria

## 2014-04-13 NOTE — Progress Notes (Signed)
   Subjective:    Patient ID: [name], female    DOB: [birth year], 64 y.o.   MRN: [id number]  HPI Patient seen and examined. She is here for follow up of her diabetes and BP. Please refer to the problem based charting for details.   Review of Systems  Constitutional: Negative.   HENT: Negative.   Eyes: Negative.   Respiratory: Negative.   Cardiovascular: Negative.   Gastrointestinal: Negative.   Genitourinary: Negative.   Musculoskeletal: Negative.   Skin: Negative.   Neurological: Negative.        Objective:   Physical Exam  Constitutional: She is oriented to person, place, and time. She appears well-developed and well-nourished.  HENT:  Head: Normocephalic and atraumatic.  Eyes: Conjunctivae are normal.  Neck: Normal range of motion.  Cardiovascular: Normal rate, regular rhythm and normal heart sounds.   Pulmonary/Chest: Effort normal and breath sounds normal. No respiratory distress. She has no wheezes.  Abdominal: Soft. Bowel sounds are normal. She exhibits no distension. There is no tenderness.  Musculoskeletal: Normal range of motion. She exhibits no tenderness.  Trace pedal edema +  Neurological: She is alert and oriented to person, place, and time.  Skin: Skin is warm and dry.  Psychiatric: She has a normal mood and affect. Her behavior is normal.          Assessment & Plan:  Please refer to problem based charting for assessment and plan:

## 2014-04-15 ENCOUNTER — Other Ambulatory Visit: Payer: Self-pay | Admitting: Internal Medicine

## 2014-04-15 NOTE — Telephone Encounter (Signed)
PCP requested F/U mid April. Pls sch

## 2014-04-28 DIAGNOSIS — M67431 Ganglion, right wrist: Secondary | ICD-10-CM | POA: Diagnosis not present

## 2014-04-29 ENCOUNTER — Encounter: Payer: Self-pay | Admitting: Podiatry

## 2014-04-29 ENCOUNTER — Ambulatory Visit (INDEPENDENT_AMBULATORY_CARE_PROVIDER_SITE_OTHER): Payer: Medicare Other | Admitting: Podiatry

## 2014-04-29 DIAGNOSIS — B351 Tinea unguium: Secondary | ICD-10-CM | POA: Diagnosis not present

## 2014-04-29 DIAGNOSIS — L84 Corns and callosities: Secondary | ICD-10-CM | POA: Diagnosis not present

## 2014-04-29 DIAGNOSIS — E1140 Type 2 diabetes mellitus with diabetic neuropathy, unspecified: Secondary | ICD-10-CM

## 2014-04-29 DIAGNOSIS — M79676 Pain in unspecified toe(s): Secondary | ICD-10-CM | POA: Diagnosis not present

## 2014-04-29 DIAGNOSIS — E1149 Type 2 diabetes mellitus with other diabetic neurological complication: Secondary | ICD-10-CM

## 2014-05-01 NOTE — Progress Notes (Signed)
Patient ID: [name], female   DOB: Oct 05, 1950, 64 y.o.   MRN: [id number]  Subjective: 64 y.o.-year-old female returns the office today for painful, elongated, thickened toenails and calluses. Denies any redness or drainage around the nails/calluses. Denies any acute changes since last appointment and no new complaints today. Denies any systemic complaints such as fevers, chills, nausea, vomiting.   Objective: AAO 3, NAD DP/PT pulses palpable, CRT less than 3 seconds Protective sensation decreased with Simms Weinstein monofilament, Achilles tendon reflex intact.  Nails hypertrophic, dystrophic, elongated, brittle, discolored 10. There is tenderness overlying these nails. There is no surrounding erythema or drainage along the nail sites. Hyperkeratotic lesion left cemented metatarsal 1 in bilateral fifth digits. Upon debridement there is no underlying ulceration or drainage or other clinical signs of infection. No open lesions or other pre-ulcerative lesions are identified. No other areas of tenderness bilateral lower extremities. No overlying edema, erythema, increased warmth. No pain with calf compression, swelling, warmth, erythema.  Assessment: Patient presents with symptomatic onychomycosis; hyperkeratotic lesions  Plan: -Treatment options including alternatives, risks, complications were discussed -Nails sharply debrided 10 without complication/bleeding. -Hyperkeratotic lesion sharply debrided 3 without complication/bleeding. -Discussed daily foot inspection. If there are any changes, to call the office immediately.  -Follow-up in 3 months or sooner if any problems are to arise. In the meantime, encouraged to call the office with any questions, concerns, changes symptoms.

## 2014-05-09 DIAGNOSIS — M67431 Ganglion, right wrist: Secondary | ICD-10-CM | POA: Diagnosis not present

## 2014-05-16 ENCOUNTER — Other Ambulatory Visit: Payer: Medicare Other

## 2014-05-16 ENCOUNTER — Encounter: Payer: Self-pay | Admitting: Internal Medicine

## 2014-05-16 ENCOUNTER — Ambulatory Visit (INDEPENDENT_AMBULATORY_CARE_PROVIDER_SITE_OTHER): Payer: Medicare Other | Admitting: Internal Medicine

## 2014-05-16 VITALS — BP 137/72 | HR 91 | Temp 98.3°F | Ht 63.0 in | Wt 157.0 lb

## 2014-05-16 DIAGNOSIS — Z0000 Encounter for general adult medical examination without abnormal findings: Secondary | ICD-10-CM | POA: Diagnosis not present

## 2014-05-16 DIAGNOSIS — M159 Polyosteoarthritis, unspecified: Secondary | ICD-10-CM

## 2014-05-16 DIAGNOSIS — I10 Essential (primary) hypertension: Secondary | ICD-10-CM | POA: Diagnosis not present

## 2014-05-16 DIAGNOSIS — E1140 Type 2 diabetes mellitus with diabetic neuropathy, unspecified: Secondary | ICD-10-CM | POA: Diagnosis not present

## 2014-05-16 LAB — POCT GLYCOSYLATED HEMOGLOBIN (HGB A1C): Hemoglobin A1C: 10.2

## 2014-05-16 LAB — BASIC METABOLIC PANEL WITH GFR
BUN: 21 mg/dL (ref 6–23)
CO2: 29 mEq/L (ref 19–32)
Calcium: 9.4 mg/dL (ref 8.4–10.5)
Chloride: 103 mEq/L (ref 96–112)
Creat: 1.15 mg/dL — ABNORMAL HIGH (ref 0.50–1.10)
GFR, Est African American: 59 mL/min — ABNORMAL LOW
GFR, Est Non African American: 51 mL/min — ABNORMAL LOW
Glucose, Bld: 203 mg/dL — ABNORMAL HIGH (ref 70–99)
Potassium: 4.3 mEq/L (ref 3.5–5.3)
Sodium: 140 mEq/L (ref 135–145)

## 2014-05-16 LAB — GLUCOSE, CAPILLARY: Glucose-Capillary: 217 mg/dL — ABNORMAL HIGH (ref 70–99)

## 2014-05-16 MED ORDER — ACETAMINOPHEN 325 MG PO TABS: 325.0000 mg | ORAL_TABLET | Freq: Three times a day (TID) | ORAL | Status: DC | PRN

## 2014-05-16 NOTE — Assessment & Plan Note (Signed)
Has Rx for Zoster but still not had vaccine

## 2014-05-16 NOTE — Progress Notes (Signed)
   Subjective:    Patient ID: Angus Palms, female    DOB: 1950/10/20, 64 y.o.   MRN: [id number]  HPI Comments: 64 y.o PMH OA, chronic low back pain, hypothyroidism, HLD, HTN, glaucoma, ganglion cyst (s/p removal ~1-2 weeks ago), DM2 with neuropathy, depression, CKD, h/o breast cancer, h/o AKI  She presents today for f/u for HTN 1. HTN-BP is 137/72. She is taking norvasc 10 and hctz 12.5. She checks her BP at home which reads ~142 at times is the highest.  She is tolerating medications. Will repeat BMET today to see Cr.   2. DM-Last A1C 10.4 03/24/14.  She is taking Levemir 50 units, Metformin XR (will confirm how may pills states 1000 bid), Prandin 1 mg tid.  She forgot her meter today and states she may have left it a church and does not know where it is currently. Advised to bring at f/u visit she understands.  Will check HA1C today   3. S/p surgery for removal of ganglion cyst. She has f/u 4/21.    HM-she has Rx for Zoster but still has not had it filled.       Review of Systems  Respiratory: Negative for shortness of breath.   Cardiovascular: Negative for chest pain.       Objective:   Physical Exam  Constitutional: She is oriented to person, place, and time. She appears well-developed and well-nourished. She is cooperative. No distress.  HENT:  Head: Normocephalic and atraumatic.  Mouth/Throat: No oropharyngeal exudate.  Eyes: Conjunctivae are normal. Pupils are equal, round, and reactive to light. Right eye exhibits no discharge. Left eye exhibits no discharge. No scleral icterus.  Cardiovascular: Normal rate, regular rhythm and normal heart sounds.   No murmur heard. Pulmonary/Chest: Effort normal and breath sounds normal. No respiratory distress. She has no wheezes.  Abdominal: Soft. Bowel sounds are normal. There is no tenderness.  Neurological: She is alert and oriented to person, place, and time. Gait normal.  Skin: Skin is warm and dry. No rash noted. She is not  diaphoretic.     Psychiatric: She has a normal mood and affect. Her speech is normal and behavior is normal. Judgment and thought content normal. Cognition and memory are normal.  Nursing note and vitals reviewed.         Assessment & Plan:  F/u in 2-3 months with PCP HTN, DM Will have pt come to see [name] before due to [facility] still above goal and 10.2

## 2014-05-16 NOTE — Patient Instructions (Signed)
General Instructions: Please follow up in 3 months, sooner like 1 month if blood pressure is uncontrolled. Your goal blood pressure should be <130/80 Bring meter at follow up. We checked your glucose today  Take care    Treatment Goals:  Goals (1 Years of Data) as of 05/16/14          As of Today 04/13/14 04/01/14 03/24/14 02/28/14     Blood Pressure   . Blood Pressure < 140/90  137/72 142/76 156/77 151/69 137/71     Result Component   . HEMOGLOBIN A1C < 7.0     10.4    . LDL CALC < 100            Progress Toward Treatment Goals:  Treatment Goal 05/16/2014  Hemoglobin A1C unchanged  Blood pressure improved  Prevent falls -    Self Care Goals & Plans:  Self Care Goal 05/16/2014  Manage my medications take my medicines as prescribed; bring my medications to every visit; refill my medications on time; follow the sick day instructions if I am sick  Monitor my health keep track of my blood pressure  Eat healthy foods drink diet soda or water instead of juice or soda; eat more vegetables; eat foods that are low in salt; eat baked foods instead of fried foods; eat fruit for snacks and desserts; eat smaller portions  Be physically active find an activity I enjoy  Meeting treatment goals maintain the current self-care plan    Home Blood Glucose Monitoring 05/16/2014  Check my blood sugar once a day  When to check my blood sugar before meals     Care Management & Community Referrals:  Referral 05/16/2014  Referrals made for care management support none needed  Referrals made to community resources none      Hypertension Hypertension, commonly called high blood pressure, is when the force of blood pumping through your arteries is too strong. Your arteries are the blood vessels that carry blood from your heart throughout your body. A blood pressure reading consists of a higher number over a lower number, such as 110/72. The higher number (systolic) is the pressure inside your arteries  when your heart pumps. The lower number (diastolic) is the pressure inside your arteries when your heart relaxes. Ideally you want your blood pressure below 120/80. Hypertension forces your heart to work harder to pump blood. Your arteries may become narrow or stiff. Having hypertension puts you at risk for heart disease, stroke, and other problems.  RISK FACTORS Some risk factors for high blood pressure are controllable. Others are not.  Risk factors you cannot control include:   Race. You may be at higher risk if you are African American.  Age. Risk increases with age.  Gender. Men are at higher risk than women before age 26 years. After age 33, women are at higher risk than men. Risk factors you can control include:  Not getting enough exercise or physical activity.  Being overweight.  Getting too much fat, sugar, calories, or salt in your diet.  Drinking too much alcohol. SIGNS AND SYMPTOMS Hypertension does not usually cause signs or symptoms. Extremely high blood pressure (hypertensive crisis) may cause headache, anxiety, shortness of breath, and nosebleed. DIAGNOSIS  To check if you have hypertension, your health care provider will measure your blood pressure while you are seated, with your arm held at the level of your heart. It should be measured at least twice using the same arm. Certain conditions can cause  a difference in blood pressure between your right and left arms. A blood pressure reading that is higher than normal on one occasion does not mean that you need treatment. If one blood pressure reading is high, ask your health care provider about having it checked again. TREATMENT  Treating high blood pressure includes making lifestyle changes and possibly taking medicine. Living a healthy lifestyle can help lower high blood pressure. You may need to change some of your habits. Lifestyle changes may include:  Following the DASH diet. This diet is high in fruits, vegetables,  and whole grains. It is low in salt, red meat, and added sugars.  Getting at least 2 hours of brisk physical activity every week.  Losing weight if necessary.  Not smoking.  Limiting alcoholic beverages.  Learning ways to reduce stress. If lifestyle changes are not enough to get your blood pressure under control, your health care provider may prescribe medicine. You may need to take more than one. Work closely with your health care provider to understand the risks and benefits. HOME CARE INSTRUCTIONS  Have your blood pressure rechecked as directed by your health care provider.   Take medicines only as directed by your health care provider. Follow the directions carefully. Blood pressure medicines must be taken as prescribed. The medicine does not work as well when you skip doses. Skipping doses also puts you at risk for problems.   Do not smoke.   Monitor your blood pressure at home as directed by your health care provider. SEEK MEDICAL CARE IF:   You think you are having a reaction to medicines taken.  You have recurrent headaches or feel dizzy.  You have swelling in your ankles.  You have trouble with your vision. SEEK IMMEDIATE MEDICAL CARE IF:  You develop a severe headache or confusion.  You have unusual weakness, numbness, or feel faint.  You have severe chest or abdominal pain.  You vomit repeatedly.  You have trouble breathing. MAKE SURE YOU:   Understand these instructions.  Will watch your condition.  Will get help right away if you are not doing well or get worse. Document Released: 01/14/2005 Document Revised: 05/31/2013 Document Reviewed: 11/06/2012 [facility] Patient Information 2015 [place], [place]. This information is not intended to replace advice given to you by your health care provider. Make sure you discuss any questions you have with your health care provider.

## 2014-05-16 NOTE — Assessment & Plan Note (Signed)
BP Readings from Last 3 Encounters:  05/16/14 137/72  04/13/14 142/76  04/01/14 156/77    Lab Results  Component Value Date   NA 140 05/16/2014   K 4.3 05/16/2014   CREATININE 1.15* 05/16/2014    Assessment: Blood pressure control: mildly elevated Progress toward BP goal:  improved Comments: BP better controlled than in past.  Plan: Medications:  continue current medicationsNorvasc 10, HCTZ 12.5.  Stricter control of BP with h/o DM would be 130/80 but PCP has goal of 140/90 (less than) in which she is at goal. No med adjustments today  Other plans: pt to check BP at home, continue current meds, f/u with PCP in 2-3 months

## 2014-05-16 NOTE — Assessment & Plan Note (Signed)
Lab Results  Component Value Date   HGBA1C 10.2 05/16/2014   HGBA1C 10.4 03/24/2014   HGBA1C 9.7 12/09/2013     Assessment: Diabetes control: poor control (HgbA1C >9%) Progress toward A1C goal:  unchanged (pending A1C ) Comments: still uncontrolled. Pt lost meter yesterday at church? Will try to look for it   Plan: Medications:  continue current medications Levemir 50 units, Metformin will confirm dose and Prandin 1 mg tid. Will call pt and confirm compliance with meds. Home glucose monitoring: Frequency: once a day (at least 1 to max 3 x per day) Timing: before meals Instruction/counseling given: reminded to bring blood glucose meter & log to each visit Other plans: f/u with PCP in 2-3 months, will have pt come to see [name] sooner. Will confirm compliance and will likely need to make med adjustments if compliant and still not much change

## 2014-05-17 ENCOUNTER — Telehealth: Payer: Self-pay | Admitting: Internal Medicine

## 2014-05-17 ENCOUNTER — Telehealth: Payer: Self-pay | Admitting: Dietician

## 2014-05-17 NOTE — Progress Notes (Signed)
INTERNAL MEDICINE TEACHING ATTENDING ADDENDUM - Theophil Thivierge, MD: I reviewed and discussed at the time of visit with the resident Dr. McLean, the patient's medical history, physical examination, diagnosis and results of pertinent tests and treatment and I agree with the patient's care as documented.  

## 2014-05-17 NOTE — Telephone Encounter (Signed)
Called pt to disc HA1C from yesterday.  A1C 10.2.  She is taking Metformin 1000 mg bid, insulin 50 units, Prandin 1 mg tid.  She checked cbg yesterday and it was 153 around lunch.  Ed pt at last visit with Dr. Dareen Piano 02/2014 he wanted her to take 55 units insulin which she was not aware but will increase to 55 units.  She will come in to meet with [name] with her meter w/in the next month and then f/u with Dr. Enedina Finner MD

## 2014-05-17 NOTE — Telephone Encounter (Signed)
Left message for patient to call to schedule and appointment and bring meter and check 4 times a day the day prior to her visit.

## 2014-05-18 ENCOUNTER — Other Ambulatory Visit: Payer: Self-pay | Admitting: Internal Medicine

## 2014-05-18 NOTE — Telephone Encounter (Signed)
Appointment schedule for next Wednesday at 10 AM

## 2014-05-23 DIAGNOSIS — H2511 Age-related nuclear cataract, right eye: Secondary | ICD-10-CM | POA: Diagnosis not present

## 2014-05-23 DIAGNOSIS — H25811 Combined forms of age-related cataract, right eye: Secondary | ICD-10-CM | POA: Diagnosis not present

## 2014-05-24 ENCOUNTER — Telehealth: Payer: Self-pay | Admitting: Dietician

## 2014-05-24 DIAGNOSIS — H2512 Age-related nuclear cataract, left eye: Secondary | ICD-10-CM | POA: Diagnosis not present

## 2014-05-24 NOTE — Telephone Encounter (Signed)
Call to patient to confirm appointment for 05/25/14 at 10:00 lmtcb

## 2014-05-25 ENCOUNTER — Encounter: Payer: Self-pay | Admitting: Dietician

## 2014-05-25 ENCOUNTER — Ambulatory Visit (INDEPENDENT_AMBULATORY_CARE_PROVIDER_SITE_OTHER): Payer: Medicare Other | Admitting: Dietician

## 2014-05-25 VITALS — Wt 157.5 lb

## 2014-05-25 DIAGNOSIS — E1165 Type 2 diabetes mellitus with hyperglycemia: Secondary | ICD-10-CM

## 2014-05-25 DIAGNOSIS — Z713 Dietary counseling and surveillance: Secondary | ICD-10-CM | POA: Diagnosis not present

## 2014-05-25 DIAGNOSIS — E1149 Type 2 diabetes mellitus with other diabetic neurological complication: Secondary | ICD-10-CM

## 2014-05-25 NOTE — Patient Instructions (Signed)
Please check blood sugar before your 2 meals each day for the next 2 weeks-  Breakfast and dinner  Also please check before your midday snack at least 2 times a week  Breakfast blood sugar Snack blood sugar Dinner Blood Sugar Bedtime blood sugar  X  X X  X X X   X  X X  X X X   X  X   X  X X  X X X    X  X X  X X X   X  X X  X X X   X  X   X  X X  X X X     Please take prandin before your midday snack

## 2014-05-25 NOTE — Progress Notes (Signed)
  Medical Nutrition Therapy:  Appt start time: 1010 end time: 1100   Assessment:  Primary concerns today: blood sugar control. Patient would like her blood sugars to be in better control: 80- 200. Did not brign her ,eter because she cannot find it. Complains of continued memory problems. Eats 2 meals a day and a snack midday sometimes. Very concerned about her weight but denies skipping diabetes medicine to control her weight.   Had cataract surgery Monday.  MEDICATIONS:Always takes levemir, 55 units last night, takes 2 metformin and 1 mg prandin twice a day, TDD estimated 36-86 DIETARY INTAKE: 24-hr recall was done and patient described reasonable intake, adequate fruits and vegetables and baked foods Meal 1- 10-11 am Snack 2-3 fruit or crackers and peanut butter dinner 6-7 hoagie with [place] and ham, lettuce, water Beverages: water, tea  Recent physical activity: walks during AD[place] and sometimes with daughter  Estimated daily energy needs: ~ 1400-1700 calories ~200 g carbohydrates   Progress Towards Goal(s):  In progress.   Nutritional Diagnosis:  [place]-2.2 Altered nutrition-related laboratory and -2.4 Predicted food-medication interaction As related to lack of adequate blood sugar control slightly improved, but still not acceptable to patient  As evidenced by A1C drop from 10.4 to 10.2.    Intervention:  Nutrition education on new meter provided to patient today, health coaching about goal setting and patients preferences regarding her diabetes self care.  coordination of care- discuss titration of prandin with physician Handouts given during visit include:AVS Monitoring/Evaluation:  Dietary intake, exercise, meter, and body weight in 2 week(s).

## 2014-06-08 ENCOUNTER — Ambulatory Visit: Payer: Medicare Other | Admitting: Dietician

## 2014-06-11 ENCOUNTER — Other Ambulatory Visit: Payer: Self-pay | Admitting: Internal Medicine

## 2014-06-13 ENCOUNTER — Other Ambulatory Visit: Payer: Self-pay | Admitting: Internal Medicine

## 2014-06-13 DIAGNOSIS — H2512 Age-related nuclear cataract, left eye: Secondary | ICD-10-CM | POA: Diagnosis not present

## 2014-06-13 DIAGNOSIS — H25812 Combined forms of age-related cataract, left eye: Secondary | ICD-10-CM | POA: Diagnosis not present

## 2014-06-21 ENCOUNTER — Telehealth: Payer: Self-pay | Admitting: *Deleted

## 2014-06-21 ENCOUNTER — Emergency Department (HOSPITAL_COMMUNITY): Payer: Medicare Other

## 2014-06-21 ENCOUNTER — Encounter (HOSPITAL_COMMUNITY): Payer: Self-pay

## 2014-06-21 ENCOUNTER — Emergency Department (HOSPITAL_COMMUNITY)
Admission: EM | Admit: 2014-06-21 | Discharge: 2014-06-21 | Disposition: A | Discharge status: Home / Self Care | Payer: Medicare Other | Attending: Emergency Medicine | Admitting: Emergency Medicine

## 2014-06-21 DIAGNOSIS — G629 Polyneuropathy, unspecified: Secondary | ICD-10-CM | POA: Diagnosis not present

## 2014-06-21 DIAGNOSIS — T07XXXA Unspecified multiple injuries, initial encounter: Secondary | ICD-10-CM

## 2014-06-21 DIAGNOSIS — N189 Chronic kidney disease, unspecified: Secondary | ICD-10-CM | POA: Insufficient documentation

## 2014-06-21 DIAGNOSIS — H409 Unspecified glaucoma: Secondary | ICD-10-CM | POA: Diagnosis not present

## 2014-06-21 DIAGNOSIS — Y998 Other external cause status: Secondary | ICD-10-CM | POA: Insufficient documentation

## 2014-06-21 DIAGNOSIS — M7989 Other specified soft tissue disorders: Secondary | ICD-10-CM | POA: Diagnosis not present

## 2014-06-21 DIAGNOSIS — W108XXA Fall (on) (from) other stairs and steps, initial encounter: Secondary | ICD-10-CM | POA: Diagnosis not present

## 2014-06-21 DIAGNOSIS — M79642 Pain in left hand: Secondary | ICD-10-CM | POA: Diagnosis not present

## 2014-06-21 DIAGNOSIS — S6992XA Unspecified injury of left wrist, hand and finger(s), initial encounter: Secondary | ICD-10-CM | POA: Diagnosis not present

## 2014-06-21 DIAGNOSIS — Y9289 Other specified places as the place of occurrence of the external cause: Secondary | ICD-10-CM | POA: Insufficient documentation

## 2014-06-21 DIAGNOSIS — E079 Disorder of thyroid, unspecified: Secondary | ICD-10-CM | POA: Diagnosis not present

## 2014-06-21 DIAGNOSIS — Z794 Long term (current) use of insulin: Secondary | ICD-10-CM | POA: Insufficient documentation

## 2014-06-21 DIAGNOSIS — I129 Hypertensive chronic kidney disease with stage 1 through stage 4 chronic kidney disease, or unspecified chronic kidney disease: Secondary | ICD-10-CM | POA: Insufficient documentation

## 2014-06-21 DIAGNOSIS — E785 Hyperlipidemia, unspecified: Secondary | ICD-10-CM | POA: Insufficient documentation

## 2014-06-21 DIAGNOSIS — Z880 Allergy status to penicillin: Secondary | ICD-10-CM | POA: Diagnosis not present

## 2014-06-21 DIAGNOSIS — F329 Major depressive disorder, single episode, unspecified: Secondary | ICD-10-CM | POA: Insufficient documentation

## 2014-06-21 DIAGNOSIS — M1990 Unspecified osteoarthritis, unspecified site: Secondary | ICD-10-CM | POA: Insufficient documentation

## 2014-06-21 DIAGNOSIS — L089 Local infection of the skin and subcutaneous tissue, unspecified: Secondary | ICD-10-CM | POA: Insufficient documentation

## 2014-06-21 DIAGNOSIS — Z853 Personal history of malignant neoplasm of breast: Secondary | ICD-10-CM | POA: Insufficient documentation

## 2014-06-21 DIAGNOSIS — E1165 Type 2 diabetes mellitus with hyperglycemia: Secondary | ICD-10-CM | POA: Insufficient documentation

## 2014-06-21 DIAGNOSIS — T798XXA Other early complications of trauma, initial encounter: Secondary | ICD-10-CM

## 2014-06-21 DIAGNOSIS — R739 Hyperglycemia, unspecified: Secondary | ICD-10-CM

## 2014-06-21 DIAGNOSIS — Z7982 Long term (current) use of aspirin: Secondary | ICD-10-CM | POA: Diagnosis not present

## 2014-06-21 DIAGNOSIS — S61032A Puncture wound without foreign body of left thumb without damage to nail, initial encounter: Secondary | ICD-10-CM | POA: Diagnosis not present

## 2014-06-21 DIAGNOSIS — Z79899 Other long term (current) drug therapy: Secondary | ICD-10-CM | POA: Diagnosis not present

## 2014-06-21 DIAGNOSIS — Z9849 Cataract extraction status, unspecified eye: Secondary | ICD-10-CM | POA: Insufficient documentation

## 2014-06-21 DIAGNOSIS — Y9389 Activity, other specified: Secondary | ICD-10-CM | POA: Insufficient documentation

## 2014-06-21 DIAGNOSIS — S61235A Puncture wound without foreign body of left ring finger without damage to nail, initial encounter: Secondary | ICD-10-CM | POA: Insufficient documentation

## 2014-06-21 DIAGNOSIS — Z862 Personal history of diseases of the blood and blood-forming organs and certain disorders involving the immune mechanism: Secondary | ICD-10-CM | POA: Insufficient documentation

## 2014-06-21 LAB — BASIC METABOLIC PANEL
Anion gap: 11 (ref 5–15)
BUN: 17 mg/dL (ref 6–20)
CO2: 26 mmol/L (ref 22–32)
Calcium: 9 mg/dL (ref 8.9–10.3)
Chloride: 100 mmol/L — ABNORMAL LOW (ref 101–111)
Creatinine, Ser: 0.97 mg/dL (ref 0.44–1.00)
GFR calc Af Amer: >60 mL/min (ref >60)
GFR calc non Af Amer: >60 mL/min (ref >60)
Glucose, Bld: 322 mg/dL — ABNORMAL HIGH (ref 65–99)
Potassium: 3.2 mmol/L — ABNORMAL LOW (ref 3.5–5.1)
Sodium: 137 mmol/L (ref 135–145)

## 2014-06-21 LAB — CBC WITH DIFFERENTIAL/PLATELET
Basophils Absolute: 0 10*3/uL (ref 0.0–0.1)
Basophils Relative: 0 % (ref 0–1)
Eosinophils Absolute: 0.2 10*3/uL (ref 0.0–0.7)
Eosinophils Relative: 2 % (ref 0–5)
HCT: 34.4 % — ABNORMAL LOW (ref 36.0–46.0)
Hemoglobin: 11.8 g/dL — ABNORMAL LOW (ref 12.0–15.0)
Lymphocytes Relative: 23 % (ref 12–46)
Lymphs Abs: 2.2 10*3/uL (ref 0.7–4.0)
MCH: 31.1 pg (ref 26.0–34.0)
MCHC: 34.3 g/dL (ref 30.0–36.0)
MCV: 90.8 fL (ref 78.0–100.0)
Monocytes Absolute: 0.4 10*3/uL (ref 0.1–1.0)
Monocytes Relative: 4 % (ref 3–12)
Neutro Abs: 6.8 10*3/uL (ref 1.7–7.7)
Neutrophils Relative %: 71 % (ref 43–77)
Platelets: 288 10*3/uL (ref 150–400)
RBC: 3.79 MIL/uL — ABNORMAL LOW (ref 3.87–5.11)
RDW: 12.5 % (ref 11.5–15.5)
WBC: 9.5 10*3/uL (ref 4.0–10.5)

## 2014-06-21 LAB — CBG MONITORING, ED
Glucose-Capillary: 476 mg/dL — ABNORMAL HIGH (ref 65–99)
Glucose-Capillary: 207 mg/dL — ABNORMAL HIGH (ref 65–99)

## 2014-06-21 MED ORDER — LIDOCAINE HCL 1 % IJ SOLN
INTRAMUSCULAR | Status: AC
  Administered 2014-06-21: 20 mL
  Filled 2014-06-21: qty 20

## 2014-06-21 MED ORDER — OXYCODONE-ACETAMINOPHEN 5-325 MG PO TABS
1.0000 | ORAL_TABLET | Freq: Once | ORAL | Status: AC
  Administered 2014-06-21: 1 via ORAL
  Filled 2014-06-21: qty 1

## 2014-06-21 MED ORDER — DOXYCYCLINE HYCLATE 100 MG PO CAPS: 100.0000 mg | ORAL_CAPSULE | Freq: Two times a day (BID) | ORAL | Status: DC

## 2014-06-21 MED ORDER — HYDROCODONE-ACETAMINOPHEN 5-325 MG PO TABS: 1.0000 | ORAL_TABLET | ORAL | Status: DC | PRN

## 2014-06-21 MED ORDER — INSULIN ASPART 100 UNIT/ML ~~LOC~~ SOLN
10.0000 [IU] | Freq: Once | SUBCUTANEOUS | Status: AC
  Administered 2014-06-21: 10 [IU] via SUBCUTANEOUS
  Filled 2014-06-21: qty 1

## 2014-06-21 MED ORDER — CEFTRIAXONE SODIUM 1 G IJ SOLR
1.0000 g | Freq: Once | INTRAMUSCULAR | Status: AC
  Administered 2014-06-21: 1 g via INTRAVENOUS
  Filled 2014-06-21: qty 10

## 2014-06-21 MED ORDER — DOXYCYCLINE HYCLATE 100 MG PO TABS
100.0000 mg | ORAL_TABLET | Freq: Once | ORAL | Status: AC
Start: 2014-06-21 — End: 2014-06-21
  Administered 2014-06-21: 100 mg via ORAL
  Filled 2014-06-21: qty 1

## 2014-06-21 MED ORDER — LIDOCAINE HCL (PF) 1 % IJ SOLN
10.0000 mL | Freq: Once | INTRAMUSCULAR | Status: AC
  Administered 2014-06-21: 10 mL
  Filled 2014-06-21: qty 10

## 2014-06-21 MED ORDER — SODIUM CHLORIDE 0.9 % IV BOLUS (SEPSIS)
1000.0000 mL | Freq: Once | INTRAVENOUS | Status: AC
  Administered 2014-06-21: 1000 mL via INTRAVENOUS

## 2014-06-21 NOTE — ED Notes (Signed)
Patient states she was chasing her dog and fell landing on her left hand. Patient c/o pain when she moves her left hand. Able to wiggle fingers.

## 2014-06-21 NOTE — ED Notes (Signed)
MD at bedside. 

## 2014-06-21 NOTE — ED Notes (Signed)
CBG 476

## 2014-06-21 NOTE — Discharge Instructions (Signed)
Read the information below.  Use the prescribed medication as directed.  Please discuss all new medications with your pharmacist.  Do not take additional tylenol while taking the prescribed pain medication to avoid overdose.  You may return to the Emergency Department at any time for worsening condition or any new symptoms that concern you.    If you develop redness, swelling, uncontrolled pain, increased pus draining from the wound, or fevers greater than 100.4, return to the ER immediately for a recheck.   Please monitor your blood sugars closely and speak with your primary care provider regarding your insulin dosage.

## 2014-06-21 NOTE — ED Notes (Signed)
Spoke with Aniceto Boss in main lab and she is coming down to collect blood.

## 2014-06-21 NOTE — Telephone Encounter (Signed)
Pt called - fell 06/18/14 while on grass. Left hand was cut - injury to thumb and third finger. States skin feels hot to touch. Left hand is swollen and yellow drainage. Suggest to go to ER. Hilda Blades Alfonzo Arca RN 06/21/14 4:15PM

## 2014-06-21 NOTE — ED Provider Notes (Signed)
CSN: [id number]     Arrival date & time 06/21/14  1707 History  This chart was scribed for a non-physician practitioner, Clayton Bibles, PA-C working with Charlesetta Shanks, MD by Martinique Peace, ED Scribe. The patient was seen in W[facility]6/[facility]. The patient's care was started at 5:28 PM.    Chief Complaint  Patient presents with  . Hand Injury      The history is provided by the patient. No language interpreter was used.   HPI Comments: [name] is a 64 y.o. female who presents to the Emergency Department complaining of left hand injury that occurred three days ago while she was chasing after her dog and fell face forward down a few stairs and put her hand out to brace herself. The pain and redness began the next day. Yesterday she tried opening abscess to the left pad of her left thumb with a safety pin and was able to get some pus out.Pt notes she had tried applying neosporin to affected area along with wrapping it and taking Tylenol for pain with minimal relief. She denies any impacts to the head or LOC during incident.   No complaints of fever, chills, or body aches.  Denies other injury or pain.   Has hx diabetes, states her blood sugars have been as high as 400 recently.     Past Medical History  Diagnosis Date  . Allergy   . Anemia   . Depression   . Arthritis   . Cancer   . Cataract   . Diabetes mellitus without complication   . Glaucoma   . Hyperlipidemia   . Hypertension   . Chronic kidney disease   . Neuromuscular disorder   . Thyroid disease   . Left arm pain 07/28/2012  . BREAST CANCER, HX OF 04/16/2006    Qualifier: Diagnosis of  By: Leward Quan MD, Pamala Hurry    . Diverticulosis    Past Surgical History  Procedure Laterality Date  . Abdominal hysterectomy    . Breast surgery    . Eye surgery    . Tubal ligation     Family History  Problem Relation Age of Onset  . Heart disease Father   . Diabetes Father   . Stroke Sister   . Anuerysm Sister   . Heart disease Brother    . Anuerysm Brother   . Cancer Daughter     breast ca   History  Substance Use Topics  . Smoking status: Never Smoker   . Smokeless tobacco: Never Used  . Alcohol Use: No   OB History    No data available     Review of Systems  Constitutional: Negative for fever and chills.  Respiratory: Negative for shortness of breath.   Gastrointestinal: Negative for vomiting.  Musculoskeletal: Negative for myalgias.       Left hand pain.   Skin: Positive for color change and wound.       Abscess to left thumb.  Allergic/Immunologic: Positive for immunocompromised state.  Neurological: Negative for syncope and headaches.  Hematological: Does not bruise/bleed easily.  Psychiatric/Behavioral: Positive for self-injury (accidental).      Allergies  Gabapentin; Penicillins; and Sulfonamide derivatives  Home Medications   Prior to Admission medications   Medication Sig Start Date End Date Taking? Authorizing Provider  acetaminophen (TYLENOL) 325 MG tablet Take 1 tablet (325 mg total) by mouth every 8 (eight) hours as needed (pain). 05/16/14   Cresenciano Genre, MD  amLODipine (NORVASC) 10 MG tablet Take 1  tablet (10 mg total) by mouth daily. 04/01/14 04/01/15  Blain Pais, MD  aspirin EC 81 MG tablet Take 81 mg by mouth at bedtime.    Historical Provider, MD  B-D ULTRAFINE III SHORT PEN 31G X 8 MM MISC  04/02/13   Historical Provider, MD  Cholecalciferol (VITAMIN D-3) 1000 UNITS CAPS Take 2,000 Units by mouth daily.     Historical Provider, MD  clobetasol cream (TEMOVATE) 7.82 % Apply 1 application topically 2 (two) times daily as needed (rash).  08/08/13   Historical Provider, MD  cycloSPORINE (RESTASIS) 0.05 % ophthalmic emulsion Place 1 drop into both eyes 2 (two) times daily.    Historical Provider, MD  gabapentin (NEURONTIN) 100 MG capsule TAKE 1 CAPSULE BY MOUTH EVERY NIGHT AT BEDTIME 06/13/14   Sid Falcon, MD  glucose 4 GM chewable tablet Chew 4 tablets (16 g total) by mouth as  needed for low blood sugar. 05/28/12   Dominic Pea, DO  hydrochlorothiazide (MICROZIDE) 12.5 MG capsule Take 1 capsule (12.5 mg total) by mouth daily. 04/13/14 04/13/15  Nischal Narendra, MD  Insulin Detemir (LEVEMIR FLEXTOUCH) 100 UNIT/ML Pen Inject 55 Units into the skin at bedtime. 03/24/14   Nischal Dareen Piano, MD  Insulin Pen Needle (BD PEN NEEDLE NANO U/F) 32G X 4 MM MISC Use as directed 10/21/13   Aldine Contes, MD  Insulin Syringe-Needle U-100 (B-D INS SYR ULTRAFINE .5CC/30G) 30G X 1/2" 0.5 ML MISC To use 4 times daily for injecting insulin. diag code 250.42. Insulin dependent 08/27/12   Dominic Pea, DO  levothyroxine (SYNTHROID, LEVOTHROID) 75 MCG tablet TAKE 1 TABLET BY MOUTH DAILY BEFORE BREAKFAST 05/19/14   Aldine Contes, MD  metFORMIN (GLUCOPHAGE XR) 500 MG 24 hr tablet Take 2 tablets (1,000 mg total) by mouth 2 (two) times daily. 02/15/14 02/15/15  Lucious Groves, DO  olopatadine (PATANOL) 0.1 % ophthalmic solution Place 1 drop into both eyes 2 (two) times daily.     Historical Provider, MD  Polyvinyl Alcohol-Povidone (REFRESH OP) Place 1 drop into both eyes 3 (three) times daily.    Historical Provider, MD  pravastatin (PRAVACHOL) 40 MG tablet Take 1 tablet (40 mg total) by mouth daily. 03/24/14   Nischal Dareen Piano, MD  repaglinide (PRANDIN) 1 MG tablet Take 1 tablet (1 mg total) by mouth 3 (three) times daily before meals. 02/15/14   Lucious Groves, DO  Travoprost, BAK Free, (TRAVATAN) 0.004 % SOLN ophthalmic solution Place 1 drop into both eyes at bedtime.     Historical Provider, MD  venlafaxine XR (EFFEXOR-XR) 75 MG 24 hr capsule TAKE 1 CAPSULE BY MOUTH EVERY DAY 04/15/14   Bartholomew Crews, MD  zoster vaccine live, PF, (ZOSTAVAX) 95621 UNT/0.65ML injection Inject 19,400 Units into the skin once. 04/13/14   Nischal Narendra, MD   BP 148/73 mmHg  Pulse 86  Temp(Src) 98.4 F (36.9 C) (Oral)  Resp 16  Ht 5\' 3"  (1.6 m)  Wt 156 lb (70.761 kg)  BMI 27.64 kg/m2  SpO2 99% Physical  Exam  Constitutional: She appears well-developed and well-nourished. No distress.  HENT:  Head: Normocephalic and atraumatic.  Neck: Neck supple.  Cardiovascular: Normal rate.   Pulmonary/Chest: Effort normal.  Musculoskeletal:  Left hand: 4th finger, medial aspect puncture wound, tender to palpation.  No erythema, edema, warmth, discharge, or tenderness  1st finger with puncture wound over pad with associated erythema, edema, warmth, tenderness.  Apparent purulence under the scabbed area.  Decreased ROM secondary to edema.  Apparent infection  does not extend into the joint.    Neurological: She is alert. She exhibits normal muscle tone.  Skin: She is not diaphoretic.  Psychiatric: She has a normal mood and affect. Her behavior is normal.  Nursing note and vitals reviewed.   ED Course  Procedures (including critical care time) Labs Review Labs Reviewed  CBC WITH DIFFERENTIAL/PLATELET - Abnormal; Notable for the following:    RBC 3.79 (*)    Hemoglobin 11.8 (*)    HCT 34.4 (*)    All other components within normal limits  CBG MONITORING, ED - Abnormal; Notable for the following:    Glucose-Capillary 476 (*)    All other components within normal limits  I-STAT CHEM 8, ED - Abnormal; Notable for the following:    Sodium 133 (*)    Potassium 6.7 (*)    Chloride 96 (*)    BUN 26 (*)    Creatinine, Ser 1.20 (*)    Glucose, Bld 478 (*)    Calcium, Ion 1.06 (*)    All other components within normal limits  BASIC METABOLIC PANEL    Imaging Review Dg Hand Complete Left  06/21/2014   CLINICAL DATA:  Fall with injury to thumb and third finger. Pain and swelling. Yellow drainage.  EXAM: LEFT HAND - COMPLETE 3+ VIEW  COMPARISON:  None.  FINDINGS: There is a tiny smooth calcific/ossific fragment adjacent the base of the thumb likely chronic. There is no acute fracture dislocation. There are mild degenerative changes over the wrist and distal interphalangeal joints. No findings to suggest  osteomyelitis.  IMPRESSION: No acute findings.   Electronically Signed   By: Marin Olp M.D.   On: 06/21/2014 18:20     EKG Interpretation None     Medications - No data to display  5:32 PM- Treatment plan was discussed with patient who verbalizes understanding and agrees.   5:54 PM Pt also seen and examined by Dr Johnney Killian who recommends I&D with vertical incisions over the pad of the thumb, lateral to the area of puncture wound.   INCISION AND DRAINAGE Performed by: Clayton Bibles Consent: Verbal consent obtained. Risks and benefits: risks, benefits and alternatives were discussed Type: abscess  Body area: left thumb   Anesthesia: digital block  Incision was made with a scalpel.  Local anesthetic: lidocaine 2% without epinephrine  Anesthetic total: 6 ml  Complexity: complex Blunt dissection to break up loculations  Drainage: purulent  Drainage amount: small  Packing material: none  Irrigated with normal saline.  Patient tolerance: Patient tolerated the procedure well with no immediate complications.     MDM   Final diagnoses:  Infection of thumb  Wound infection, initial encounter  Multiple puncture wounds  Hyperglycemia    AFebrile nontoxic diabetic patient with uncontrolled blood sugar p/w infection of left 1st finger after mechanical fall 3 days ago with puncture wound from unknown substance.  Apparent superficial abscess in fingerpad of thumb.  Doubt tendon involvement.  Doubt felon.  CBG 476.  IVF, SQ insulin given.  Also seen and assessed by Dr Johnney Killian who recommended that I I&D the thumb. Per my discussion with Dr Johnney Killian, I have I&D'd the thumb and found a very superficial abscess around the puncture wound.  Patient signed out to Dr Johnney Killian at end of my shift pending EKG, repeat potassium, IVF, insulin to improve hyperglycemia.  Per our discussion, recommend hand surgery follow up.  Pt has seen Dr Burney Gauze in the past.  Anticipate d/c home with  antibiotics.     I personally performed the services described in this documentation, which was scribed in my presence. The recorded information has been reviewed and is accurate.    Clayton Bibles, PA-C 06/21/14 2012  Charlesetta Shanks, MD 06/23/14 2218

## 2014-06-21 NOTE — ED Notes (Signed)
I attempted to collect lab and was unsuccessful 

## 2014-06-23 DIAGNOSIS — S61401D Unspecified open wound of right hand, subsequent encounter: Secondary | ICD-10-CM | POA: Diagnosis not present

## 2014-06-23 DIAGNOSIS — M79645 Pain in left finger(s): Secondary | ICD-10-CM | POA: Diagnosis not present

## 2014-06-23 DIAGNOSIS — L03011 Cellulitis of right finger: Secondary | ICD-10-CM | POA: Diagnosis not present

## 2014-06-23 DIAGNOSIS — S61402D Unspecified open wound of left hand, subsequent encounter: Secondary | ICD-10-CM | POA: Diagnosis not present

## 2014-06-23 DIAGNOSIS — M25642 Stiffness of left hand, not elsewhere classified: Secondary | ICD-10-CM | POA: Diagnosis not present

## 2014-06-23 LAB — I-STAT CHEM 8, ED
BUN: 26 mg/dL — ABNORMAL HIGH (ref 6–20)
Calcium, Ion: 1.06 mmol/L — ABNORMAL LOW (ref 1.13–1.30)
Chloride: 96 mmol/L — ABNORMAL LOW (ref 101–111)
Creatinine, Ser: 1.2 mg/dL — ABNORMAL HIGH (ref 0.44–1.00)
Glucose, Bld: 478 mg/dL — ABNORMAL HIGH (ref 65–99)
HCT: 39 % (ref 36.0–46.0)
Hemoglobin: 13.3 g/dL (ref 12.0–15.0)
Potassium: 6.7 mmol/L (ref 3.5–5.1)
Sodium: 133 mmol/L — ABNORMAL LOW (ref 135–145)
TCO2: 24 mmol/L (ref 0–100)

## 2014-06-28 DIAGNOSIS — S61402D Unspecified open wound of left hand, subsequent encounter: Secondary | ICD-10-CM | POA: Diagnosis not present

## 2014-06-28 DIAGNOSIS — L03011 Cellulitis of right finger: Secondary | ICD-10-CM | POA: Diagnosis not present

## 2014-06-28 DIAGNOSIS — S61401D Unspecified open wound of right hand, subsequent encounter: Secondary | ICD-10-CM | POA: Diagnosis not present

## 2014-06-29 DIAGNOSIS — L03012 Cellulitis of left finger: Secondary | ICD-10-CM | POA: Diagnosis not present

## 2014-06-29 DIAGNOSIS — L02512 Cutaneous abscess of left hand: Secondary | ICD-10-CM | POA: Diagnosis not present

## 2014-06-29 DIAGNOSIS — S61402D Unspecified open wound of left hand, subsequent encounter: Secondary | ICD-10-CM | POA: Diagnosis not present

## 2014-06-30 ENCOUNTER — Telehealth: Payer: Self-pay | Admitting: Internal Medicine

## 2014-06-30 ENCOUNTER — Ambulatory Visit: Payer: Medicare Other | Admitting: Internal Medicine

## 2014-06-30 DIAGNOSIS — M79645 Pain in left finger(s): Secondary | ICD-10-CM | POA: Diagnosis not present

## 2014-06-30 DIAGNOSIS — S61401D Unspecified open wound of right hand, subsequent encounter: Secondary | ICD-10-CM | POA: Diagnosis not present

## 2014-06-30 DIAGNOSIS — M25642 Stiffness of left hand, not elsewhere classified: Secondary | ICD-10-CM | POA: Diagnosis not present

## 2014-06-30 NOTE — Telephone Encounter (Signed)
Call to patient to confirm appointment for 07/01/14 at 9:15 lmtcb

## 2014-07-01 ENCOUNTER — Ambulatory Visit: Payer: Medicare Other | Admitting: Internal Medicine

## 2014-07-07 ENCOUNTER — Ambulatory Visit (INDEPENDENT_AMBULATORY_CARE_PROVIDER_SITE_OTHER): Payer: Medicare Other | Admitting: Ophthalmology

## 2014-07-13 ENCOUNTER — Other Ambulatory Visit: Payer: Self-pay | Admitting: Internal Medicine

## 2014-07-16 ENCOUNTER — Other Ambulatory Visit: Payer: Self-pay | Admitting: Internal Medicine

## 2014-07-18 ENCOUNTER — Other Ambulatory Visit: Payer: Self-pay | Admitting: Internal Medicine

## 2014-07-26 ENCOUNTER — Other Ambulatory Visit: Payer: Self-pay | Admitting: Sports Medicine

## 2014-07-27 ENCOUNTER — Telehealth: Payer: Self-pay | Admitting: *Deleted

## 2014-07-27 NOTE — Telephone Encounter (Signed)
Pt has appt for mammogram on 7/14 and requests we fax over lab orders to [facility] for labs to be done same day as mammogram.  She has appt to see Dr. Alvy Bimler on 7/18.  She says Dr. Beryle Beams would have her to do labs same day as mammogram before her office visit.

## 2014-07-27 NOTE — Telephone Encounter (Signed)
LVM for pt informing of Dr. Calton Dach reply below.  Asked her to call back if any questions, otherwise keep appt as scheduled on 08/15/14.

## 2014-07-27 NOTE — Telephone Encounter (Signed)
She had blood work last month, looks fine Per discussion last year, no need blood work

## 2014-07-28 DIAGNOSIS — S61402D Unspecified open wound of left hand, subsequent encounter: Secondary | ICD-10-CM | POA: Diagnosis not present

## 2014-07-29 ENCOUNTER — Ambulatory Visit: Payer: Medicare Other | Admitting: Podiatry

## 2014-07-31 ENCOUNTER — Other Ambulatory Visit: Payer: Self-pay | Admitting: Internal Medicine

## 2014-08-02 ENCOUNTER — Other Ambulatory Visit: Payer: Self-pay | Admitting: Internal Medicine

## 2014-08-03 DIAGNOSIS — S61402D Unspecified open wound of left hand, subsequent encounter: Secondary | ICD-10-CM | POA: Diagnosis not present

## 2014-08-03 DIAGNOSIS — L02512 Cutaneous abscess of left hand: Secondary | ICD-10-CM | POA: Diagnosis not present

## 2014-08-03 DIAGNOSIS — M869 Osteomyelitis, unspecified: Secondary | ICD-10-CM | POA: Diagnosis not present

## 2014-08-04 DIAGNOSIS — M25642 Stiffness of left hand, not elsewhere classified: Secondary | ICD-10-CM | POA: Diagnosis not present

## 2014-08-04 DIAGNOSIS — M79645 Pain in left finger(s): Secondary | ICD-10-CM | POA: Diagnosis not present

## 2014-08-04 DIAGNOSIS — S61402D Unspecified open wound of left hand, subsequent encounter: Secondary | ICD-10-CM | POA: Diagnosis not present

## 2014-08-05 DIAGNOSIS — S61402D Unspecified open wound of left hand, subsequent encounter: Secondary | ICD-10-CM | POA: Diagnosis not present

## 2014-08-08 DIAGNOSIS — S61402D Unspecified open wound of left hand, subsequent encounter: Secondary | ICD-10-CM | POA: Diagnosis not present

## 2014-08-08 DIAGNOSIS — M79645 Pain in left finger(s): Secondary | ICD-10-CM | POA: Diagnosis not present

## 2014-08-08 DIAGNOSIS — M25642 Stiffness of left hand, not elsewhere classified: Secondary | ICD-10-CM | POA: Diagnosis not present

## 2014-08-09 ENCOUNTER — Encounter: Payer: Self-pay | Admitting: Genetic Counselor

## 2014-08-10 DIAGNOSIS — M79645 Pain in left finger(s): Secondary | ICD-10-CM | POA: Diagnosis not present

## 2014-08-10 DIAGNOSIS — S61402D Unspecified open wound of left hand, subsequent encounter: Secondary | ICD-10-CM | POA: Diagnosis not present

## 2014-08-10 DIAGNOSIS — M25642 Stiffness of left hand, not elsewhere classified: Secondary | ICD-10-CM | POA: Diagnosis not present

## 2014-08-11 ENCOUNTER — Ambulatory Visit
Admission: RE | Admit: 2014-08-11 | Discharge: 2014-08-11 | Disposition: A | Discharge status: Home / Self Care | Payer: Medicare Other | Source: Ambulatory Visit

## 2014-08-11 DIAGNOSIS — Z1231 Encounter for screening mammogram for malignant neoplasm of breast: Secondary | ICD-10-CM | POA: Diagnosis not present

## 2014-08-12 ENCOUNTER — Other Ambulatory Visit: Payer: Self-pay | Admitting: Internal Medicine

## 2014-08-12 DIAGNOSIS — M25642 Stiffness of left hand, not elsewhere classified: Secondary | ICD-10-CM | POA: Diagnosis not present

## 2014-08-12 DIAGNOSIS — M79645 Pain in left finger(s): Secondary | ICD-10-CM | POA: Diagnosis not present

## 2014-08-12 DIAGNOSIS — S61402D Unspecified open wound of left hand, subsequent encounter: Secondary | ICD-10-CM | POA: Diagnosis not present

## 2014-08-15 ENCOUNTER — Encounter: Payer: Self-pay | Admitting: Hematology and Oncology

## 2014-08-15 ENCOUNTER — Ambulatory Visit (HOSPITAL_BASED_OUTPATIENT_CLINIC_OR_DEPARTMENT_OTHER): Payer: Medicare Other | Admitting: Hematology and Oncology

## 2014-08-15 ENCOUNTER — Ambulatory Visit: Payer: Medicare Other | Admitting: Hematology and Oncology

## 2014-08-15 VITALS — BP 131/69 | HR 96 | Temp 98.6°F | Resp 18 | Ht 63.0 in | Wt 141.9 lb

## 2014-08-15 DIAGNOSIS — S61402D Unspecified open wound of left hand, subsequent encounter: Secondary | ICD-10-CM | POA: Diagnosis not present

## 2014-08-15 DIAGNOSIS — Z853 Personal history of malignant neoplasm of breast: Secondary | ICD-10-CM | POA: Diagnosis not present

## 2014-08-15 NOTE — Progress Notes (Signed)
[facility] OFFICE PROGRESS NOTE  Patient Care Team: Aldine Contes, MD as PCP - General (Internal Medicine) Hayden Pedro, MD as Attending Physician (Ophthalmology)  SUMMARY OF ONCOLOGIC HISTORY:  I reviewed the patient's records extensive and collaborated the history with the patient. Summary of her history is as follows: This patient was diagnosed with breast cancer of the incidental finding of a lump on her chest when she presented to her doctor with coughing. I do not have all the available records. She was initially diagnosed with a Stage III B lymph node positive cancer of the right breast in April of 1999, treated with induction chemotherapy followed by a right mastectomy then radiation and additional chemotherapy. She achieved a durable response with no evidence for recurrence. Due to her young age, she had genetic counselor and was tested negative for BRCA mutation. She never received adjuvant tamoxifen because she recalled that he was issued a receptor negative. The patient has 4 children and her first child was born at age of 52. She never breast-feed any of her children. She did not recall taking hormone replacement or birth control pill. She had a partial hysterectomy at a young age due to heavy menstruation.  INTERVAL HISTORY: Please see below for problem oriented charting.  She denies any recent abnormal breast examination, palpable mass, abnormal breast appearance or nipple changes  REVIEW OF SYSTEMS:   Constitutional: Denies fevers, chills or abnormal weight loss Eyes: Denies blurriness of vision Ears, nose, mouth, throat, and face: Denies mucositis or sore throat Respiratory: Denies cough, dyspnea or wheezes Cardiovascular: Denies palpitation, chest discomfort or lower extremity swelling Gastrointestinal:  Denies nausea, heartburn or change in bowel habits Skin: Denies abnormal skin rashes Lymphatics: Denies new lymphadenopathy or easy  bruising Neurological:Denies numbness, tingling or new weaknesses Behavioral/Psych: Mood is stable, no new changes  All other systems were reviewed with the patient and are negative.  I have reviewed the past medical history, past surgical history, social history and family history with the patient and they are unchanged from previous note.  ALLERGIES:  is allergic to gabapentin; penicillins; and sulfonamide derivatives.  MEDICATIONS:  Current Outpatient Prescriptions  Medication Sig Dispense Refill  . acetaminophen (TYLENOL) 325 MG tablet Take 1 tablet (325 mg total) by mouth every 8 (eight) hours as needed (pain). 90 tablet 1  . amLODipine (NORVASC) 10 MG tablet Take 1 tablet (10 mg total) by mouth daily. 30 tablet 11  . aspirin EC 81 MG tablet Take 81 mg by mouth at bedtime.    . Cholecalciferol (VITAMIN D-3) 1000 UNITS CAPS Take 2,000 Units by mouth daily.     . cycloSPORINE (RESTASIS) 0.05 % ophthalmic emulsion Place 1 drop into both eyes 2 (two) times daily.    [name] gabapentin (NEURONTIN) 100 MG capsule TAKE 1 CAPSULE BY MOUTH EVERY NIGHT AT BEDTIME 90 capsule 0  . hydrochlorothiazide (MICROZIDE) 12.5 MG capsule Take 1 capsule (12.5 mg total) by mouth daily. 30 capsule 3  . Insulin Detemir (LEVEMIR FLEXTOUCH) 100 UNIT/ML Pen Inject 55 Units into the skin at bedtime. 15 mL 6  . Insulin Pen Needle (BD PEN NEEDLE NANO U/F) 32G X 4 MM MISC Use as directed 100 each 11  . Insulin Syringe-Needle U-100 (B-D INS SYR ULTRAFINE .5CC/30G) 30G X 1/2" 0.5 ML MISC To use 4 times daily for injecting insulin. diag code 250.42. Insulin dependent 150 each 6  . levothyroxine (SYNTHROID, LEVOTHROID) 75 MCG tablet TAKE 1 TABLET BY MOUTH DAILY BEFORE BREAKFAST  30 tablet 3  . meloxicam (MOBIC) 15 MG tablet Take 15 mg by mouth daily as needed for pain (pain).     . metFORMIN (GLUCOPHAGE XR) 500 MG 24 hr tablet Take 2 tablets (1,000 mg total) by mouth 2 (two) times daily. 120 tablet 11  . Polyvinyl  Alcohol-Povidone (REFRESH OP) Place 1 drop into both eyes 3 (three) times daily.    . pravastatin (PRAVACHOL) 40 MG tablet TAKE 1 TABLET BY MOUTH DAILY 90 tablet 0  . repaglinide (PRANDIN) 1 MG tablet TAKE 1 TABLET BY MOUTH THREE TIMES DAILY BEFORE A MEAL 270 tablet 0  . venlafaxine XR (EFFEXOR-XR) 75 MG 24 hr capsule TAKE 1 CAPSULE BY MOUTH EVERY DAY 30 capsule 5  . zoster vaccine live, PF, (ZOSTAVAX) 06237 UNT/0.65ML injection Inject 19,400 Units into the skin once. 1 each 0  . clobetasol cream (TEMOVATE) 6.28 % Apply 1 application topically 2 (two) times daily as needed (rash).     [name] glucose 4 GM chewable tablet Chew 4 tablets (16 g total) by mouth as needed for low blood sugar. (Patient not taking: Reported on 08/15/2014) 50 tablet 12  . HYDROcodone-acetaminophen (NORCO/VICODIN) 5-325 MG per tablet Take 1-2 tablets by mouth every 4 (four) hours as needed for moderate pain or severe pain. (Patient not taking: Reported on 08/15/2014) 15 tablet 0  . oxyCODONE-acetaminophen (PERCOCET/ROXICET) 5-325 MG per tablet      No current facility-administered medications for this visit.    PHYSICAL EXAMINATION: ECOG PERFORMANCE STATUS: 0 - Asymptomatic  Filed Vitals:   08/15/14 1111  BP: 131/69  Pulse: 96  Temp: 98.6 F (37 C)  Resp: 18   Filed Weights   08/15/14 1111  Weight: 141 lb 14.4 oz (64.365 kg)    GENERAL:alert, no distress and comfortable SKIN: skin color, texture, turgor are normal, no rashes or significant lesions EYES: normal, Conjunctiva are pink and non-injected, sclera clear OROPHARYNX:no exudate, no erythema and lips, buccal mucosa, and tongue normal  NECK: supple, thyroid normal size, non-tender, without nodularity LYMPH:  no palpable lymphadenopathy in the cervical, axillary or inguinal LUNGS: clear to auscultation and percussion with normal breathing effort HEART: regular rate & rhythm and no murmurs and no lower extremity edema ABDOMEN:abdomen soft, non-tender and normal  bowel sounds Musculoskeletal:no cyanosis of digits and no clubbing  NEURO: alert & oriented x 3 with fluent speech, no focal motor/sensory deficits Chest wall examination revealed well-healed right mastectomy scar. Normal breast exam on the left. LABORATORY DATA:  I have reviewed the data as listed    Component Value Date/Time   NA 137 06/21/2014 2126   NA 143 08/09/2013 0934   K 3.2* 06/21/2014 2126   K 3.4* 08/09/2013 0934   CL 100* 06/21/2014 2126   CO2 26 06/21/2014 2126   CO2 25 08/09/2013 0934   GLUCOSE 322* 06/21/2014 2126   GLUCOSE 192* 08/09/2013 0934   BUN 17 06/21/2014 2126   BUN 27.3* 08/09/2013 0934   CREATININE 0.97 06/21/2014 2126   CREATININE 1.15* 05/16/2014 1036   CREATININE 1.4* 08/09/2013 0934   CALCIUM 9.0 06/21/2014 2126   CALCIUM 10.3 08/09/2013 0934   PROT 6.9 12/09/2013 1034   PROT 7.4 08/09/2013 0934   ALBUMIN 4.1 12/09/2013 1034   ALBUMIN 3.9 08/09/2013 0934   AST 16 12/09/2013 1034   AST 16 08/09/2013 0934   ALT <8 12/09/2013 1034   ALT 12 08/09/2013 0934   ALKPHOS 59 12/09/2013 1034   ALKPHOS 71 08/09/2013 0934   BILITOT  0.3 12/09/2013 1034   BILITOT <0.20 08/09/2013 0934   GFRNONAA >60 06/21/2014 2126   GFRNONAA 51* 05/16/2014 1036   GFRAA >60 06/21/2014 2126   GFRAA 59* 05/16/2014 1036    No results found for: SPEP, UPEP  Lab Results  Component Value Date   WBC 9.5 06/21/2014   NEUTROABS 6.8 06/21/2014   HGB 13.3 06/21/2014   HCT 39.0 06/21/2014   MCV 90.8 06/21/2014   PLT 288 06/21/2014      Chemistry      Component Value Date/Time   NA 137 06/21/2014 2126   NA 143 08/09/2013 0934   K 3.2* 06/21/2014 2126   K 3.4* 08/09/2013 0934   CL 100* 06/21/2014 2126   CO2 26 06/21/2014 2126   CO2 25 08/09/2013 0934   BUN 17 06/21/2014 2126   BUN 27.3* 08/09/2013 0934   CREATININE 0.97 06/21/2014 2126   CREATININE 1.15* 05/16/2014 1036   CREATININE 1.4* 08/09/2013 0934      Component Value Date/Time   CALCIUM 9.0 06/21/2014  2126   CALCIUM 10.3 08/09/2013 0934   ALKPHOS 59 12/09/2013 1034   ALKPHOS 71 08/09/2013 0934   AST 16 12/09/2013 1034   AST 16 08/09/2013 0934   ALT <8 12/09/2013 1034   ALT 12 08/09/2013 0934   BILITOT 0.3 12/09/2013 1034   BILITOT <0.20 08/09/2013 0934       RADIOGRAPHIC STUDIES: Recent mammogram was negative I have personally reviewed the radiological images as listed and agreed with the findings in the report.   ASSESSMENT & PLAN:  BREAST CANCER, HX OF She was diagnosed more than 15 years ago. Her records were not available, but from what the patient recall, she had a estrogen/progesterone receptor negative breast cancer. Clinically, she has no evidence of recurrence. The patient wants long-term followup at the [facility]. I will see her on a yearly basis with history, physical examination and yearly mammogram without further blood work. When she was diagnosed, she was very young. She recalls seeing a Dietitian and tested negative for hereditary breast/ovary cancer mutations.   No orders of the defined types were placed in this encounter.   All questions were answered. The patient knows to call the clinic with any problems, questions or concerns. No barriers to learning was detected. I spent 15 minutes counseling the patient face to face. The total time spent in the appointment was 20 minutes and more than 50% was on counseling and review of test results     [facility], [place], MD 08/15/2014 11:27 AM

## 2014-08-15 NOTE — Assessment & Plan Note (Signed)
She was diagnosed more than 15 years ago. Her records were not available, but from what the patient recall, she had a estrogen/progesterone receptor negative breast cancer. Clinically, she has no evidence of recurrence. The patient wants long-term followup at the [facility]. I will see her on a yearly basis with history, physical examination and yearly mammogram without further blood work. When she was diagnosed, she was very young. She recalls seeing a Dietitian and tested negative for hereditary breast/ovary cancer mutations.

## 2014-08-17 DIAGNOSIS — S61402D Unspecified open wound of left hand, subsequent encounter: Secondary | ICD-10-CM | POA: Diagnosis not present

## 2014-08-19 DIAGNOSIS — S61402D Unspecified open wound of left hand, subsequent encounter: Secondary | ICD-10-CM | POA: Diagnosis not present

## 2014-08-23 DIAGNOSIS — S61402D Unspecified open wound of left hand, subsequent encounter: Secondary | ICD-10-CM | POA: Diagnosis not present

## 2014-08-25 DIAGNOSIS — S61402D Unspecified open wound of left hand, subsequent encounter: Secondary | ICD-10-CM | POA: Diagnosis not present

## 2014-08-29 DIAGNOSIS — S61402D Unspecified open wound of left hand, subsequent encounter: Secondary | ICD-10-CM | POA: Diagnosis not present

## 2014-09-01 DIAGNOSIS — S61402D Unspecified open wound of left hand, subsequent encounter: Secondary | ICD-10-CM | POA: Diagnosis not present

## 2014-09-05 DIAGNOSIS — S61402D Unspecified open wound of left hand, subsequent encounter: Secondary | ICD-10-CM | POA: Diagnosis not present

## 2014-09-07 ENCOUNTER — Ambulatory Visit (INDEPENDENT_AMBULATORY_CARE_PROVIDER_SITE_OTHER): Payer: Medicare Other | Admitting: Podiatry

## 2014-09-07 ENCOUNTER — Encounter: Payer: Self-pay | Admitting: Podiatry

## 2014-09-07 VITALS — BP 102/55 | HR 100 | Resp 17

## 2014-09-07 DIAGNOSIS — M79676 Pain in unspecified toe(s): Secondary | ICD-10-CM | POA: Diagnosis not present

## 2014-09-07 DIAGNOSIS — E1140 Type 2 diabetes mellitus with diabetic neuropathy, unspecified: Secondary | ICD-10-CM

## 2014-09-07 DIAGNOSIS — B351 Tinea unguium: Secondary | ICD-10-CM | POA: Diagnosis not present

## 2014-09-07 DIAGNOSIS — E1149 Type 2 diabetes mellitus with other diabetic neurological complication: Secondary | ICD-10-CM

## 2014-09-07 NOTE — Progress Notes (Signed)
Patient ID: [name], female   DOB: [birth year], 64 y.o.   MRN: [id number] Complaint:  Visit Type: Patient returns to my office for continued preventative foot care services. Complaint: Patient states" my nails have grown long and thick and become painful to walk and wear shoes" Patient has been diagnosed with DM with no foot complications. The patient presents for preventative foot care services. No changes to ROS  Podiatric Exam: Vascular: dorsalis pedis and posterior tibial pulses are palpable bilateral. Capillary return is immediate. Temperature gradient is WNL. Skin turgor WNL  Sensorium: Normal Semmes Weinstein monofilament test. Normal tactile sensation bilaterally. Nail Exam: Pt has thick disfigured discolored nails with subungual debris noted bilateral entire nail hallux through fifth toenails Ulcer Exam: There is no evidence of ulcer or pre-ulcerative changes or infection. Orthopedic Exam: Muscle tone and strength are WNL. No limitations in general ROM. No crepitus or effusions noted. Foot type and digits show no abnormalities. Bony prominences are unremarkable. Skin: No Porokeratosis. No infection or ulcers  Diagnosis:  Onychomycosis, , Pain in right toe, pain in left toes  Treatment & Plan Procedures and Treatment: Consent by patient was obtained for treatment procedures. The patient understood the discussion of treatment and procedures well. All questions were answered thoroughly reviewed. Debridement of mycotic and hypertrophic toenails, 1 through 5 bilateral and clearing of subungual debris. No ulceration, no infection noted.  Return Visit-Office Procedure: Patient instructed to return to the office for a follow up visit 3 months for continued evaluation and treatment.

## 2014-09-09 ENCOUNTER — Encounter (HOSPITAL_BASED_OUTPATIENT_CLINIC_OR_DEPARTMENT_OTHER)
Admission: EM | Disposition: A | Discharge status: Home / Self Care | Payer: Self-pay | Source: Ambulatory Visit | Attending: Orthopedic Surgery

## 2014-09-09 ENCOUNTER — Ambulatory Visit (HOSPITAL_BASED_OUTPATIENT_CLINIC_OR_DEPARTMENT_OTHER)
Admission: EM | Admit: 2014-09-09 | Discharge: 2014-09-09 | Disposition: A | Discharge status: Home / Self Care | Payer: Medicare Other | Source: Ambulatory Visit | Attending: Orthopedic Surgery | Admitting: Orthopedic Surgery

## 2014-09-09 ENCOUNTER — Ambulatory Visit (HOSPITAL_BASED_OUTPATIENT_CLINIC_OR_DEPARTMENT_OTHER): Payer: Medicare Other | Admitting: Anesthesiology

## 2014-09-09 ENCOUNTER — Encounter (HOSPITAL_BASED_OUTPATIENT_CLINIC_OR_DEPARTMENT_OTHER): Payer: Self-pay | Admitting: *Deleted

## 2014-09-09 ENCOUNTER — Other Ambulatory Visit: Payer: Self-pay | Admitting: Orthopedic Surgery

## 2014-09-09 DIAGNOSIS — F329 Major depressive disorder, single episode, unspecified: Secondary | ICD-10-CM | POA: Insufficient documentation

## 2014-09-09 DIAGNOSIS — M67844 Other specified disorders of tendon, left hand: Secondary | ICD-10-CM | POA: Diagnosis not present

## 2014-09-09 DIAGNOSIS — N189 Chronic kidney disease, unspecified: Secondary | ICD-10-CM | POA: Diagnosis not present

## 2014-09-09 DIAGNOSIS — M65042 Abscess of tendon sheath, left hand: Secondary | ICD-10-CM | POA: Insufficient documentation

## 2014-09-09 DIAGNOSIS — Z90710 Acquired absence of both cervix and uterus: Secondary | ICD-10-CM | POA: Diagnosis not present

## 2014-09-09 DIAGNOSIS — E119 Type 2 diabetes mellitus without complications: Secondary | ICD-10-CM | POA: Diagnosis not present

## 2014-09-09 DIAGNOSIS — S61402D Unspecified open wound of left hand, subsequent encounter: Secondary | ICD-10-CM | POA: Diagnosis not present

## 2014-09-09 DIAGNOSIS — E039 Hypothyroidism, unspecified: Secondary | ICD-10-CM | POA: Diagnosis not present

## 2014-09-09 DIAGNOSIS — R000 Tachycardia, unspecified: Secondary | ICD-10-CM | POA: Insufficient documentation

## 2014-09-09 DIAGNOSIS — Z853 Personal history of malignant neoplasm of breast: Secondary | ICD-10-CM | POA: Diagnosis not present

## 2014-09-09 DIAGNOSIS — L0889 Other specified local infections of the skin and subcutaneous tissue: Secondary | ICD-10-CM | POA: Diagnosis not present

## 2014-09-09 DIAGNOSIS — Z880 Allergy status to penicillin: Secondary | ICD-10-CM | POA: Diagnosis not present

## 2014-09-09 DIAGNOSIS — I890 Lymphedema, not elsewhere classified: Secondary | ICD-10-CM | POA: Diagnosis not present

## 2014-09-09 DIAGNOSIS — Z882 Allergy status to sulfonamides status: Secondary | ICD-10-CM | POA: Diagnosis not present

## 2014-09-09 DIAGNOSIS — E785 Hyperlipidemia, unspecified: Secondary | ICD-10-CM | POA: Insufficient documentation

## 2014-09-09 DIAGNOSIS — Z9011 Acquired absence of right breast and nipple: Secondary | ICD-10-CM | POA: Insufficient documentation

## 2014-09-09 DIAGNOSIS — E780 Pure hypercholesterolemia: Secondary | ICD-10-CM | POA: Insufficient documentation

## 2014-09-09 DIAGNOSIS — Z7982 Long term (current) use of aspirin: Secondary | ICD-10-CM | POA: Insufficient documentation

## 2014-09-09 DIAGNOSIS — I129 Hypertensive chronic kidney disease with stage 1 through stage 4 chronic kidney disease, or unspecified chronic kidney disease: Secondary | ICD-10-CM | POA: Insufficient documentation

## 2014-09-09 DIAGNOSIS — Z888 Allergy status to other drugs, medicaments and biological substances status: Secondary | ICD-10-CM | POA: Insufficient documentation

## 2014-09-09 DIAGNOSIS — M86642 Other chronic osteomyelitis, left hand: Secondary | ICD-10-CM | POA: Diagnosis not present

## 2014-09-09 DIAGNOSIS — Z794 Long term (current) use of insulin: Secondary | ICD-10-CM | POA: Diagnosis not present

## 2014-09-09 DIAGNOSIS — I10 Essential (primary) hypertension: Secondary | ICD-10-CM | POA: Diagnosis not present

## 2014-09-09 HISTORY — DX: Hypothyroidism, unspecified: E03.9

## 2014-09-09 HISTORY — PX: I & D EXTREMITY: SHX5045

## 2014-09-09 HISTORY — DX: Lymphedema, not elsewhere classified: I89.0

## 2014-09-09 LAB — POCT I-STAT, CHEM 8
BUN: 19 mg/dL (ref 6–20)
Calcium, Ion: 1.24 mmol/L (ref 1.13–1.30)
Chloride: 102 mmol/L (ref 101–111)
Creatinine, Ser: 1.1 mg/dL — ABNORMAL HIGH (ref 0.44–1.00)
Glucose, Bld: 76 mg/dL (ref 65–99)
HCT: 35 % — ABNORMAL LOW (ref 36.0–46.0)
Hemoglobin: 11.9 g/dL — ABNORMAL LOW (ref 12.0–15.0)
Potassium: 3.1 mmol/L — ABNORMAL LOW (ref 3.5–5.1)
Sodium: 142 mmol/L (ref 135–145)
TCO2: 24 mmol/L (ref 0–100)

## 2014-09-09 LAB — GLUCOSE, CAPILLARY
Glucose-Capillary: 43 mg/dL — CL (ref 65–99)
Glucose-Capillary: 198 mg/dL — ABNORMAL HIGH (ref 65–99)
Glucose-Capillary: 149 mg/dL — ABNORMAL HIGH (ref 65–99)
Glucose-Capillary: 45 mg/dL — ABNORMAL LOW (ref 65–99)

## 2014-09-09 SURGERY — IRRIGATION AND DEBRIDEMENT EXTREMITY
Anesthesia: General | Site: Thumb | Laterality: Left

## 2014-09-09 MED ORDER — MIDAZOLAM HCL 2 MG/2ML IJ SOLN
1.0000 mg | INTRAMUSCULAR | Status: DC | PRN
  Administered 2014-09-09: .5 mg via INTRAVENOUS

## 2014-09-09 MED ORDER — CHLORHEXIDINE GLUCONATE 4 % EX LIQD: 60.0000 mL | Freq: Once | CUTANEOUS | Status: DC

## 2014-09-09 MED ORDER — BUPIVACAINE HCL (PF) 0.25 % IJ SOLN
INTRAMUSCULAR | Status: DC | PRN
  Administered 2014-09-09: 6 mL

## 2014-09-09 MED ORDER — GLYCOPYRROLATE 0.2 MG/ML IJ SOLN: 0.2000 mg | Freq: Once | INTRAMUSCULAR | Status: DC | PRN

## 2014-09-09 MED ORDER — FENTANYL CITRATE (PF) 100 MCG/2ML IJ SOLN: 25.0000 ug | INTRAMUSCULAR | Status: DC | PRN

## 2014-09-09 MED ORDER — LIDOCAINE HCL (CARDIAC) 20 MG/ML IV SOLN
INTRAVENOUS | Status: DC | PRN
  Administered 2014-09-09: 40 mg via INTRAVENOUS

## 2014-09-09 MED ORDER — LACTATED RINGERS IV SOLN
INTRAVENOUS | Status: DC
  Administered 2014-09-09: 13:00:00 via INTRAVENOUS

## 2014-09-09 MED ORDER — LEVOFLOXACIN 750 MG PO TABS: 750.0000 mg | ORAL_TABLET | Freq: Every day | ORAL | Status: DC

## 2014-09-09 MED ORDER — ONDANSETRON HCL 4 MG/2ML IJ SOLN: 4.0000 mg | Freq: Once | INTRAMUSCULAR | Status: DC | PRN

## 2014-09-09 MED ORDER — DEXTROSE 50 % IV SOLN
1.0000 | INTRAVENOUS | Status: AC
  Administered 2014-09-09: 50 mL via INTRAVENOUS

## 2014-09-09 MED ORDER — DEXTROSE 50 % IV SOLN
INTRAVENOUS | Status: AC
  Filled 2014-09-09: qty 50

## 2014-09-09 MED ORDER — MIDAZOLAM HCL 2 MG/2ML IJ SOLN
INTRAMUSCULAR | Status: AC
  Filled 2014-09-09: qty 2

## 2014-09-09 MED ORDER — HYDROCODONE-ACETAMINOPHEN 5-325 MG PO TABS: 1.0000 | ORAL_TABLET | Freq: Four times a day (QID) | ORAL | Status: DC | PRN

## 2014-09-09 MED ORDER — SCOPOLAMINE 1 MG/3DAYS TD PT72: 1.0000 | MEDICATED_PATCH | Freq: Once | TRANSDERMAL | Status: DC | PRN

## 2014-09-09 MED ORDER — DEXAMETHASONE SODIUM PHOSPHATE 4 MG/ML IJ SOLN
INTRAMUSCULAR | Status: DC | PRN
Start: 2014-09-09 — End: 2014-09-09
  Administered 2014-09-09: 10 mg via INTRAVENOUS

## 2014-09-09 MED ORDER — FENTANYL CITRATE (PF) 100 MCG/2ML IJ SOLN
50.0000 ug | INTRAMUSCULAR | Status: DC | PRN
  Administered 2014-09-09: 50 ug via INTRAVENOUS

## 2014-09-09 MED ORDER — DEXTROSE 50 % IV SOLN
1.0000 | Freq: Once | INTRAVENOUS | Status: AC
  Administered 2014-09-09: 50 mL via INTRAVENOUS

## 2014-09-09 MED ORDER — FENTANYL CITRATE (PF) 100 MCG/2ML IJ SOLN
INTRAMUSCULAR | Status: AC
  Filled 2014-09-09: qty 6

## 2014-09-09 MED ORDER — OXYCODONE HCL 5 MG/5ML PO SOLN: 5.0000 mg | Freq: Once | ORAL | Status: DC | PRN

## 2014-09-09 MED ORDER — 0.9 % SODIUM CHLORIDE (POUR BTL) OPTIME
TOPICAL | Status: DC | PRN
  Administered 2014-09-09: 120 mL

## 2014-09-09 MED ORDER — OXYCODONE HCL 5 MG PO TABS: 5.0000 mg | ORAL_TABLET | Freq: Once | ORAL | Status: DC | PRN

## 2014-09-09 MED ORDER — LACTATED RINGERS IV SOLN
INTRAVENOUS | Status: DC | PRN
  Administered 2014-09-09: 16:00:00 via INTRAVENOUS

## 2014-09-09 MED ORDER — PROPOFOL 10 MG/ML IV BOLUS
INTRAVENOUS | Status: DC | PRN
  Administered 2014-09-09: 180 mg via INTRAVENOUS

## 2014-09-09 MED ORDER — ONDANSETRON HCL 4 MG/2ML IJ SOLN
INTRAMUSCULAR | Status: DC | PRN
  Administered 2014-09-09: 4 mg via INTRAVENOUS

## 2014-09-09 SURGICAL SUPPLY — 50 items
BAG DECANTER FOR FLEXI CONT (MISCELLANEOUS) IMPLANT
BLADE MINI RND TIP GREEN BEAV (BLADE) IMPLANT
BLADE SURG 15 STRL LF DISP TIS (BLADE) ×1 IMPLANT
BLADE SURG 15 STRL SS (BLADE) ×2
BNDG CMPR 9X4 STRL LF SNTH (GAUZE/BANDAGES/DRESSINGS)
BNDG COHESIVE 1X5 TAN STRL LF (GAUZE/BANDAGES/DRESSINGS) ×1 IMPLANT
BNDG COHESIVE 3X5 TAN STRL LF (GAUZE/BANDAGES/DRESSINGS) IMPLANT
BNDG ESMARK 4X9 LF (GAUZE/BANDAGES/DRESSINGS) IMPLANT
BNDG GAUZE ELAST 4 BULKY (GAUZE/BANDAGES/DRESSINGS) IMPLANT
CHLORAPREP W/TINT 26ML (MISCELLANEOUS) ×2 IMPLANT
CORDS BIPOLAR (ELECTRODE) ×2 IMPLANT
COVER BACK TABLE 60X90IN (DRAPES) ×2 IMPLANT
COVER MAYO STAND STRL (DRAPES) ×2 IMPLANT
CUFF TOURNIQUET SINGLE 18IN (TOURNIQUET CUFF) IMPLANT
DRAPE EXTREMITY T 121X128X90 (DRAPE) ×2 IMPLANT
DRAPE SURG 17X23 STRL (DRAPES) ×2 IMPLANT
DRSG KUZMA FLUFF (GAUZE/BANDAGES/DRESSINGS) ×1 IMPLANT
GAUZE PACKING IODOFORM 1/4X15 (GAUZE/BANDAGES/DRESSINGS) ×1 IMPLANT
GAUZE SPONGE 4X4 12PLY STRL (GAUZE/BANDAGES/DRESSINGS) ×2 IMPLANT
GAUZE XEROFORM 1X8 LF (GAUZE/BANDAGES/DRESSINGS) ×2 IMPLANT
GLOVE BIOGEL PI IND STRL 7.0 (GLOVE) IMPLANT
GLOVE BIOGEL PI IND STRL 8.5 (GLOVE) ×1 IMPLANT
GLOVE BIOGEL PI INDICATOR 7.0 (GLOVE) ×3
GLOVE BIOGEL PI INDICATOR 8.5 (GLOVE) ×1
GLOVE ECLIPSE 6.5 STRL STRAW (GLOVE) ×1 IMPLANT
GLOVE EXAM NITRILE MD LF STRL (GLOVE) ×2 IMPLANT
GLOVE SURG ORTHO 8.0 STRL STRW (GLOVE) ×2 IMPLANT
GOWN STRL REUS W/ TWL LRG LVL3 (GOWN DISPOSABLE) ×1 IMPLANT
GOWN STRL REUS W/TWL LRG LVL3 (GOWN DISPOSABLE) ×4
GOWN STRL REUS W/TWL XL LVL3 (GOWN DISPOSABLE) ×2 IMPLANT
LOOP VESSEL MAXI BLUE (MISCELLANEOUS) IMPLANT
NDL PRECISIONGLIDE 27X1.5 (NEEDLE) IMPLANT
NEEDLE PRECISIONGLIDE 27X1.5 (NEEDLE) ×2 IMPLANT
NS IRRIG 1000ML POUR BTL (IV SOLUTION) ×2 IMPLANT
PACK BASIN DAY SURGERY FS (CUSTOM PROCEDURE TRAY) ×2 IMPLANT
PAD CAST 3X4 CTTN HI CHSV (CAST SUPPLIES) IMPLANT
PADDING CAST ABS 4INX4YD NS (CAST SUPPLIES) ×1
PADDING CAST ABS COTTON 4X4 ST (CAST SUPPLIES) ×1 IMPLANT
PADDING CAST COTTON 3X4 STRL (CAST SUPPLIES)
SPLINT PLASTER CAST XFAST 3X15 (CAST SUPPLIES) IMPLANT
SPLINT PLASTER XTRA FASTSET 3X (CAST SUPPLIES)
STOCKINETTE 4X48 STRL (DRAPES) ×2 IMPLANT
SUT ETHILON 4 0 PS 2 18 (SUTURE) ×1 IMPLANT
SWAB COLLECTION DEVICE MRSA (MISCELLANEOUS) ×1 IMPLANT
SYR BULB 3OZ (MISCELLANEOUS) ×2 IMPLANT
SYR CONTROL 10ML LL (SYRINGE) ×1 IMPLANT
TOWEL OR 17X24 6PK STRL BLUE (TOWEL DISPOSABLE) ×3 IMPLANT
TUBE ANAEROBIC SPECIMEN COL (MISCELLANEOUS) ×1 IMPLANT
TUBE FEEDING 5FR 15 INCH (TUBING) IMPLANT
UNDERPAD 30X30 (UNDERPADS AND DIAPERS) ×2 IMPLANT

## 2014-09-09 NOTE — Brief Op Note (Signed)
09/09/2014  4:10 PM  PATIENT:  [name]  64 y.o. female  PRE-OPERATIVE DIAGNOSIS:  Infection  left thumb  POST-OPERATIVE DIAGNOSIS:  * No post-op diagnosis entered *  PROCEDURE:  Procedure(s) with comments: IRRIGATION AND DEBRIDEMENT EXTREMITY (Left) -  I & D left thumb distal phalynx   SURGEON:  Surgeon(s) and Role:    * Daryll Brod, MD - Primary  PHYSICIAN ASSISTANT:   ASSISTANTS: none   ANESTHESIA:   local and general  EBL:  Total I/O In: 900 [I.V.:900] Out: -   BLOOD ADMINISTERED:none  DRAINS: none   LOCAL MEDICATIONS USED:  BUPIVICAINE   SPECIMEN:  Excision  DISPOSITION OF SPECIMEN:  PATHOLOGY  COUNTS:  YES  TOURNIQUET:   Total Tourniquet Time Documented: area (Left) - 7 minutes Total: area (Left) - 7 minutes   DICTATION: .Other Dictation: Dictation Number [id number]  PLAN OF CARE: Discharge to home after PACU  PATIENT DISPOSITION:  PACU - hemodynamically stable.

## 2014-09-09 NOTE — Op Note (Signed)
#  [id number]   

## 2014-09-09 NOTE — Transfer of Care (Signed)
Immediate Anesthesia Transfer of Care Note  Patient: [name]  Procedure(s) Performed: Procedure(s) with comments: IRRIGATION AND DEBRIDEMENT EXTREMITY (Left) -  I & D left thumb distal phalynx   Patient Location: PACU  Anesthesia Type:General  Level of Consciousness: awake and sedated  Airway & Oxygen Therapy: Patient Spontanous Breathing and Patient connected to face mask oxygen  Post-op Assessment: Report given to RN and Post -op Vital signs reviewed and stable  Post vital signs: Reviewed and stable  Last Vitals:  Filed Vitals:   09/09/14 1617  BP: 108/44  Pulse: 88  Temp:   Resp: 13    Complications: No apparent anesthesia complications

## 2014-09-09 NOTE — Anesthesia Postprocedure Evaluation (Signed)
  Anesthesia Post-op Note  Patient: [name]  Procedure(s) Performed: Procedure(s): IRRIGATION AND DEBRIDEMENT LEFT THUMB DISTAL PHALANX (Left)  Patient Location: PACU  Anesthesia Type:General  Level of Consciousness: awake, alert  and oriented  Airway and Oxygen Therapy: Patient Spontanous Breathing  Post-op Pain: none  Post-op Assessment: Post-op Vital signs reviewed, Patient's Cardiovascular Status Stable, Respiratory Function Stable, Patent Airway and Pain level controlled              Post-op Vital Signs: stable  Last Vitals:  Filed Vitals:   09/09/14 1710  BP: 125/89  Pulse: 96  Temp: 36.9 C  Resp: 20    Complications: No apparent anesthesia complications

## 2014-09-09 NOTE — H&P (Signed)
[name] is a 64 year-old right-hand dominant female presenting to our office on 5/31 after undergoing I&D of palmar aspect of the left thumb in the emergency department on 06/21/14.  She was placed on doxycycline and pain medication.  She presented to our office for recheck.  She ended up having to undergo repeat I&D with both soft tissue and bone cultures on both 6/1 and 08/03/14.  The cultures from both times revealed multiple organisms present, but none predominant.  There was no staph aureus or group A strep isolated, however.  After her second I&D she was placed on Levaquin 750 mg. one PO q day. She was seen earlier in our office this week on 8/8 and at that time the wound looked good.  She was told to continue with her whirlpool and antibiotics until this was completed.    She presents today for whirlpool. She states that her last antibiotic was taken yesterday, 09/08/14.    ALLERGIES:   Penicillin and sulfa.  MEDICAL HISTORY:     She is an insulin dependent diabetic.  She has thyroid disease and high cholesterol.   MEDICATIONS:    She is on 22 separate medications for diabetes.  She is on no current blood thinners.  SURGICAL HISTORY:  She has had multiple surgeries in the past.  FAMILY MEDICAL HISTORY:  Strong family history of diabetes, heart disease, hypertension and arthritis.  SOCIAL HISTORY:    Patient does not smoke or consume alcohol.  She is widowed and resides in [place].  (RD PAC) [name] is an 64 y.o. female.   Chief Complaint: Left thumb abcess  HPI: see above  Past Medical History  Diagnosis Date  . Allergy   . Anemia   . Depression   . Arthritis   . Cancer   . Cataract   . Diabetes mellitus without complication   . Glaucoma   . Hyperlipidemia   . Hypertension   . Chronic kidney disease   . Neuromuscular disorder   . Thyroid disease   . Left arm pain 07/28/2012  . BREAST CANCER, HX OF 04/16/2006    Qualifier: Diagnosis of  By: Leward Quan MD, Pamala Hurry    .  Diverticulosis   . Hypothyroidism   . Lymphedema of upper extremity     right    Past Surgical History  Procedure Laterality Date  . Abdominal hysterectomy    . Breast surgery    . Eye surgery    . Tubal ligation    . Mastectomy Right 1999  . Finger surgery Left 2016    thumb infection (3 times so far)  . Ganglion cyst excision Right 2016    right wrist    Family History  Problem Relation Age of Onset  . Heart disease Father   . Diabetes Father   . Stroke Sister   . Anuerysm Sister   . Heart disease Brother   . Anuerysm Brother   . Cancer Daughter     breast ca   Social History:  reports that she has never smoked. She has never used smokeless tobacco. She reports that she does not drink alcohol or use illicit drugs.  Allergies:  Allergies  Allergen Reactions  . Gabapentin Other (See Comments)    Dizziness from 300 mg, tolerates 100 mg  . Penicillins Rash  . Sulfonamide Derivatives Rash    Medications Prior to Admission  Medication Sig Dispense Refill  . acetaminophen (TYLENOL) 325 MG tablet Take 1 tablet (325 mg total) by  mouth every 8 (eight) hours as needed (pain). 90 tablet 1  . amLODipine (NORVASC) 10 MG tablet Take 1 tablet (10 mg total) by mouth daily. 30 tablet 11  . aspirin EC 81 MG tablet Take 81 mg by mouth at bedtime.    . Cholecalciferol (VITAMIN D-3) 1000 UNITS CAPS Take 2,000 Units by mouth daily.     . clobetasol cream (TEMOVATE) 5.17 % Apply 1 application topically 2 (two) times daily as needed (rash).     . cycloSPORINE (RESTASIS) 0.05 % ophthalmic emulsion Place 1 drop into both eyes 2 (two) times daily.    [name] gabapentin (NEURONTIN) 100 MG capsule TAKE 1 CAPSULE BY MOUTH EVERY NIGHT AT BEDTIME 90 capsule 0  . hydrochlorothiazide (MICROZIDE) 12.5 MG capsule Take 1 capsule (12.5 mg total) by mouth daily. 30 capsule 3  . Insulin Detemir (LEVEMIR FLEXTOUCH) 100 UNIT/ML Pen Inject 55 Units into the skin at bedtime. 15 mL 6  . Insulin Pen Needle (BD PEN  NEEDLE NANO U/F) 32G X 4 MM MISC Use as directed 100 each 11  . Insulin Syringe-Needle U-100 (B-D INS SYR ULTRAFINE .5CC/30G) 30G X 1/2" 0.5 ML MISC To use 4 times daily for injecting insulin. diag code 250.42. Insulin dependent 150 each 6  . levothyroxine (SYNTHROID, LEVOTHROID) 75 MCG tablet TAKE 1 TABLET BY MOUTH DAILY BEFORE BREAKFAST 30 tablet 3  . meloxicam (MOBIC) 15 MG tablet Take 15 mg by mouth daily as needed for pain (pain).     . metFORMIN (GLUCOPHAGE XR) 500 MG 24 hr tablet Take 2 tablets (1,000 mg total) by mouth 2 (two) times daily. 120 tablet 11  . Polyvinyl Alcohol-Povidone (REFRESH OP) Place 1 drop into both eyes 3 (three) times daily.    . pravastatin (PRAVACHOL) 40 MG tablet TAKE 1 TABLET BY MOUTH DAILY 90 tablet 0  . repaglinide (PRANDIN) 1 MG tablet TAKE 1 TABLET BY MOUTH THREE TIMES DAILY BEFORE A MEAL 270 tablet 0  . venlafaxine XR (EFFEXOR-XR) 75 MG 24 hr capsule TAKE 1 CAPSULE BY MOUTH EVERY DAY 30 capsule 5  . glucose 4 GM chewable tablet Chew 4 tablets (16 g total) by mouth as needed for low blood sugar. (Patient not taking: Reported on 08/15/2014) 50 tablet 12  . HYDROcodone-acetaminophen (NORCO/VICODIN) 5-325 MG per tablet Take 1-2 tablets by mouth every 4 (four) hours as needed for moderate pain or severe pain. (Patient not taking: Reported on 08/15/2014) 15 tablet 0  . oxyCODONE-acetaminophen (PERCOCET/ROXICET) 5-325 MG per tablet     . zoster vaccine live, PF, (ZOSTAVAX) 61607 UNT/0.65ML injection Inject 19,400 Units into the skin once. 1 each 0    Results for orders placed or performed during the hospital encounter of 09/09/14 (from the past 48 hour(s))  I-STAT, chem 8     Status: Abnormal   Collection Time: 09/09/14 12:55 PM  Result Value Ref Range   Sodium 142 135 - 145 mmol/L   Potassium 3.1 (L) 3.5 - 5.1 mmol/L   Chloride 102 101 - 111 mmol/L   BUN 19 6 - 20 mg/dL   Creatinine, Ser 1.10 (H) 0.44 - 1.00 mg/dL   Glucose, Bld 76 65 - 99 mg/dL   Calcium, Ion  1.24 1.13 - 1.30 mmol/L   TCO2 24 0 - 100 mmol/L   Hemoglobin 11.9 (L) 12.0 - 15.0 g/dL   HCT 35.0 (L) 36.0 - 46.0 %  Glucose, capillary     Status: Abnormal   Collection Time: 09/09/14  1:22 PM  Result  Value Ref Range   Glucose-Capillary 43 (LL) 65 - 99 mg/dL  Glucose, capillary     Status: Abnormal   Collection Time: 09/09/14  1:46 PM  Result Value Ref Range   Glucose-Capillary 198 (H) 65 - 99 mg/dL    No results found.   Pertinent items are noted in HPI.  Blood pressure 141/60, pulse 99, temperature 98.3 F (36.8 C), temperature source Oral, resp. rate 13, height 5\' 3"  (1.6 m), weight 65.772 kg (145 lb), SpO2 100 %.  General appearance: cooperative and appears stated age Head: Normocephalic, without obvious abnormality Neck: no JVD Resp: clear to auscultation bilaterally Cardio: regular rate and rhythm, S1, S2 normal, no murmur, click, rub or gallop GI: soft, non-tender; bowel sounds normal; no masses,  no organomegaly Extremities: infection left thumb Pulses: 2+ and symmetric Skin: Skin color, texture, turgor normal. No rashes or lesions Neurologic: Grossly normal Incision/Wound: Open thumb  Assessment/Plan IMPRESSION:    Recurrent infection, left thumb.   PLAN:  We will take [name] to the operating room for more extensive I&D of the thumb with probable intraoperative cultures depending on findings.  She will return to see [place] on Monday, 09/12/14 or sooner should she have any other problems or questions.   Dayne Chait R 09/09/2014, 2:03 PM

## 2014-09-09 NOTE — Progress Notes (Signed)
Pt remains alert and oriented with bs in 40's, permission from pt to sign op permit by daughter while new iv being placed to give d50.  Witnessed by patty caviness rn

## 2014-09-09 NOTE — Anesthesia Preprocedure Evaluation (Addendum)
Anesthesia Evaluation  Patient identified by MRN, date of birth, ID band Patient awake    Reviewed: Allergy & Precautions, NPO status , Patient's Chart, lab work & pertinent test results  Airway Mallampati: II  TM Distance: >3 FB Neck ROM: Full    Dental  (+) Edentulous Upper, Edentulous Lower   Pulmonary  breath sounds clear to auscultation        Cardiovascular hypertension, Rhythm:Regular Rate:Tachycardia     Neuro/Psych    GI/Hepatic   Endo/Other  diabetes  Renal/GU      Musculoskeletal   Abdominal   Peds  Hematology   Anesthesia Other Findings   Reproductive/Obstetrics                            Anesthesia Physical Anesthesia Plan  ASA: III  Anesthesia Plan: General   Post-op Pain Management:    Induction: Intravenous  Airway Management Planned: LMA  Additional Equipment:   Intra-op Plan:   Post-operative Plan:   Informed Consent: I have reviewed the patients History and Physical, chart, labs and discussed the procedure including the risks, benefits and alternatives for the proposed anesthesia with the patient or authorized representative who has indicated his/her understanding and acceptance.     Plan Discussed with: CRNA and Anesthesiologist  Anesthesia Plan Comments: (Wound L. Thumb  Type 2 DM with hypogyclemia htn  Plan GA with LMA)        Anesthesia Quick Evaluation

## 2014-09-09 NOTE — Discharge Instructions (Addendum)

## 2014-09-09 NOTE — Anesthesia Procedure Notes (Signed)
Procedure Name: LMA Insertion Performed by: Valeree Leidy W Pre-anesthesia Checklist: Patient identified, Timeout performed, Emergency Drugs available, Suction available and Patient being monitored Patient Re-evaluated:Patient Re-evaluated prior to inductionOxygen Delivery Method: Circle system utilized Preoxygenation: Pre-oxygenation with 100% oxygen Intubation Type: IV induction Ventilation: Mask ventilation without difficulty LMA: LMA inserted LMA Size: 4.0 Number of attempts: 1 Placement Confirmation: positive ETCO2 and breath sounds checked- equal and bilateral Tube secured with: Tape Dental Injury: Teeth and Oropharynx as per pre-operative assessment      

## 2014-09-09 NOTE — Progress Notes (Signed)
Patient c/o dizziness at 1321. Moved to stretcher and placed on monitor. Dr Linna Caprice at bedside and fingerstick CBG obtained (43) glucose was 76 by ISTAT done at 1255. Dr Linna Caprice placed EJ and D50 given IV per order. Patient groggy, but responsive to all stimuli and no LOC. Repeat CBG at 1346 (198). Patient states she "feels better". See Flowsheet data for all vital signs.

## 2014-09-10 NOTE — Op Note (Signed)
[name], KILROY NO.:  [id number]  MEDICAL RECORD NO.:  [id number]  LOCATION:                                 FACILITY:  PHYSICIAN:  Daryll Brod, M.D.       DATE OF BIRTH:  1950-10-10  DATE OF PROCEDURE:  09/09/2014 DATE OF DISCHARGE:                              OPERATIVE REPORT   PREOPERATIVE DIAGNOSIS:  Infection, left thumb.  POSTOPERATIVE DIAGNOSIS:  Infection, left thumb.  OPERATION:  Incision and drainage with debridement of necrotic flexor tendon, terminal insertion, left thumb.  SURGEON:  Daryll Brod, M.D.  ANESTHESIA:  General.  ANESTHESIOLOGIST:  Glynda Jaeger, M.D.  HISTORY:  The patient is a 64 year old female with a history of a bite to her thumb and infection.  This has become progressively more infected despite being open, she is admitted for I and D.  she has a history of osteomyelitis of the distal phalanx.  In the preoperative area, the patient is seen, the extremity marked by both patient and surgeon.  PROCEDURE IN DETAIL:  The patient was brought to the operating room.  A general anesthetic carried out without difficulty under the direction of Dr. Linna Caprice.  She was prepped using Betadine scrub and solution with left arm free.  A 3-minute dry time was allowed.  Time-out taken confirming the patient and procedure.  The thumb was exsanguinated from the proximal phalanx.  At the base of the proximal phalanx, a Penrose drain was used for tourniquet control.  The area pointing abscess was then incised at the termination of her flexor tendon distal interphalangeal joint crease.   This area was opened,purulent material was immediately encountered.  The flexor tendon was found to be necrotic and ruptured.  This was debrided with a rongeur and sharp dissection. The distal phalanx was debrided.  Specimen was sent to Pathology.  Specimens were taken for aerobic and anaerobic cultures.  The wound was then copiously irrigated with saline.  The  resulting wound was packed open.  A sterile compressive dressing was applied.  The tourniquet removed.  She was taken to the recovery room for observation in satisfactory condition.  She will be discharged home to return to the [facility] in 1 week, on [place], which she has been on.  She will return on Monday for repacking.          ______________________________ Daryll Brod, M.D.     GK/MEDQ  D:  09/09/2014  T:  09/10/2014  Job:  [id number]

## 2014-09-12 ENCOUNTER — Encounter (HOSPITAL_BASED_OUTPATIENT_CLINIC_OR_DEPARTMENT_OTHER): Payer: Self-pay | Admitting: Orthopedic Surgery

## 2014-09-12 DIAGNOSIS — S61402D Unspecified open wound of left hand, subsequent encounter: Secondary | ICD-10-CM | POA: Diagnosis not present

## 2014-09-13 LAB — CULTURE, ROUTINE-ABSCESS

## 2014-09-15 DIAGNOSIS — M79645 Pain in left finger(s): Secondary | ICD-10-CM | POA: Diagnosis not present

## 2014-09-15 DIAGNOSIS — M25642 Stiffness of left hand, not elsewhere classified: Secondary | ICD-10-CM | POA: Diagnosis not present

## 2014-09-15 DIAGNOSIS — S61402D Unspecified open wound of left hand, subsequent encounter: Secondary | ICD-10-CM | POA: Diagnosis not present

## 2014-09-15 LAB — ANAEROBIC CULTURE

## 2014-09-16 DIAGNOSIS — M79605 Pain in left leg: Secondary | ICD-10-CM | POA: Diagnosis not present

## 2014-09-16 DIAGNOSIS — R600 Localized edema: Secondary | ICD-10-CM | POA: Diagnosis not present

## 2014-09-16 DIAGNOSIS — I83893 Varicose veins of bilateral lower extremities with other complications: Secondary | ICD-10-CM | POA: Diagnosis not present

## 2014-09-18 ENCOUNTER — Other Ambulatory Visit: Payer: Self-pay | Admitting: Internal Medicine

## 2014-09-19 ENCOUNTER — Ambulatory Visit (INDEPENDENT_AMBULATORY_CARE_PROVIDER_SITE_OTHER): Payer: Medicare Other | Admitting: Ophthalmology

## 2014-09-19 DIAGNOSIS — H43813 Vitreous degeneration, bilateral: Secondary | ICD-10-CM

## 2014-09-19 DIAGNOSIS — H35033 Hypertensive retinopathy, bilateral: Secondary | ICD-10-CM | POA: Diagnosis not present

## 2014-09-19 DIAGNOSIS — I10 Essential (primary) hypertension: Secondary | ICD-10-CM

## 2014-09-19 DIAGNOSIS — E11319 Type 2 diabetes mellitus with unspecified diabetic retinopathy without macular edema: Secondary | ICD-10-CM

## 2014-09-19 DIAGNOSIS — E11329 Type 2 diabetes mellitus with mild nonproliferative diabetic retinopathy without macular edema: Secondary | ICD-10-CM

## 2014-09-19 DIAGNOSIS — E11339 Type 2 diabetes mellitus with moderate nonproliferative diabetic retinopathy without macular edema: Secondary | ICD-10-CM

## 2014-09-20 DIAGNOSIS — I83893 Varicose veins of bilateral lower extremities with other complications: Secondary | ICD-10-CM | POA: Diagnosis not present

## 2014-09-22 DIAGNOSIS — S61402D Unspecified open wound of left hand, subsequent encounter: Secondary | ICD-10-CM | POA: Diagnosis not present

## 2014-09-23 DIAGNOSIS — I83812 Varicose veins of left lower extremities with pain: Secondary | ICD-10-CM | POA: Diagnosis not present

## 2014-09-23 DIAGNOSIS — I83892 Varicose veins of left lower extremities with other complications: Secondary | ICD-10-CM | POA: Diagnosis not present

## 2014-09-23 DIAGNOSIS — R600 Localized edema: Secondary | ICD-10-CM | POA: Diagnosis not present

## 2014-09-29 DIAGNOSIS — M25642 Stiffness of left hand, not elsewhere classified: Secondary | ICD-10-CM | POA: Diagnosis not present

## 2014-09-29 DIAGNOSIS — S61401D Unspecified open wound of right hand, subsequent encounter: Secondary | ICD-10-CM | POA: Diagnosis not present

## 2014-09-29 DIAGNOSIS — M79645 Pain in left finger(s): Secondary | ICD-10-CM | POA: Diagnosis not present

## 2014-10-04 DIAGNOSIS — M86642 Other chronic osteomyelitis, left hand: Secondary | ICD-10-CM | POA: Diagnosis not present

## 2014-10-10 ENCOUNTER — Encounter (INDEPENDENT_AMBULATORY_CARE_PROVIDER_SITE_OTHER): Payer: Medicare Other | Admitting: Ophthalmology

## 2014-10-10 ENCOUNTER — Other Ambulatory Visit: Payer: Self-pay | Admitting: Internal Medicine

## 2014-10-10 DIAGNOSIS — E11331 Type 2 diabetes mellitus with moderate nonproliferative diabetic retinopathy with macular edema: Secondary | ICD-10-CM | POA: Diagnosis not present

## 2014-10-10 DIAGNOSIS — E11311 Type 2 diabetes mellitus with unspecified diabetic retinopathy with macular edema: Secondary | ICD-10-CM

## 2014-10-10 DIAGNOSIS — E11321 Type 2 diabetes mellitus with mild nonproliferative diabetic retinopathy with macular edema: Secondary | ICD-10-CM | POA: Diagnosis not present

## 2014-10-18 ENCOUNTER — Encounter: Payer: Self-pay | Admitting: Internal Medicine

## 2014-10-18 ENCOUNTER — Other Ambulatory Visit: Payer: Self-pay | Admitting: *Deleted

## 2014-10-18 ENCOUNTER — Ambulatory Visit (INDEPENDENT_AMBULATORY_CARE_PROVIDER_SITE_OTHER): Payer: Medicare Other | Admitting: Dietician

## 2014-10-18 ENCOUNTER — Other Ambulatory Visit: Payer: Self-pay | Admitting: Internal Medicine

## 2014-10-18 ENCOUNTER — Ambulatory Visit (INDEPENDENT_AMBULATORY_CARE_PROVIDER_SITE_OTHER): Payer: Medicare Other | Admitting: Internal Medicine

## 2014-10-18 ENCOUNTER — Telehealth: Payer: Self-pay | Admitting: Dietician

## 2014-10-18 ENCOUNTER — Encounter: Payer: Self-pay | Admitting: Dietician

## 2014-10-18 VITALS — BP 125/67 | HR 90 | Temp 98.2°F | Wt 143.7 lb

## 2014-10-18 DIAGNOSIS — Z1159 Encounter for screening for other viral diseases: Secondary | ICD-10-CM | POA: Diagnosis not present

## 2014-10-18 DIAGNOSIS — Z794 Long term (current) use of insulin: Secondary | ICD-10-CM

## 2014-10-18 DIAGNOSIS — E2839 Other primary ovarian failure: Secondary | ICD-10-CM | POA: Diagnosis not present

## 2014-10-18 DIAGNOSIS — E119 Type 2 diabetes mellitus without complications: Secondary | ICD-10-CM | POA: Diagnosis not present

## 2014-10-18 DIAGNOSIS — E1140 Type 2 diabetes mellitus with diabetic neuropathy, unspecified: Secondary | ICD-10-CM

## 2014-10-18 DIAGNOSIS — Z23 Encounter for immunization: Secondary | ICD-10-CM | POA: Diagnosis not present

## 2014-10-18 DIAGNOSIS — E0849 Diabetes mellitus due to underlying condition with other diabetic neurological complication: Secondary | ICD-10-CM

## 2014-10-18 DIAGNOSIS — E1149 Type 2 diabetes mellitus with other diabetic neurological complication: Secondary | ICD-10-CM

## 2014-10-18 DIAGNOSIS — Z713 Dietary counseling and surveillance: Secondary | ICD-10-CM | POA: Diagnosis not present

## 2014-10-18 DIAGNOSIS — I10 Essential (primary) hypertension: Secondary | ICD-10-CM | POA: Diagnosis not present

## 2014-10-18 DIAGNOSIS — M79602 Pain in left arm: Secondary | ICD-10-CM

## 2014-10-18 LAB — GLUCOSE, CAPILLARY: Glucose-Capillary: 113 mg/dL — ABNORMAL HIGH (ref 65–99)

## 2014-10-18 LAB — POCT GLYCOSYLATED HEMOGLOBIN (HGB A1C): Hemoglobin A1C: 9.6

## 2014-10-18 NOTE — Assessment & Plan Note (Addendum)
-   Will attempt to set up appointment for DEXA scan - Will screen for hepatitis C - Flu shot given today

## 2014-10-18 NOTE — Progress Notes (Signed)
  Medical Nutrition Therapy:  Appt start time: 8413 end time: 1015   Assessment:  Primary concerns today: blood sugar control. Patient reports a lot of stress over past 3 months with eye and thumb surgeries. The antibiotics she was on decreased her appetite and she has lost weight. (10-13# in past 3 months) unintentionally. It has been difficult to check her blood sugars with thumb out of commission and she also reports having trouble getting testing supplies from [facility].  WEIGHT: 143.6#, IBW: 115# -127#, BMI is 25 which is healthy BLOOD SUGARS: average for past 30 days 130, past 90 days 217, A1C today is 9.6, improved from 10.2, her download does show low blood sugars fasting and patient reports symptoms at this time, before dinner and bedtime, range is 59 to HIGH MEDICATIONS: Decreased her levemir to 50 units because of low blood sugars and often was told to take half her dose for her surgeries,  takes 2 metformin and 1 mg prandin twice a day with food, TDD 33-78, basal 16-17 to 39 units/day DIETARY INTAKE: 24-hr recall was done and patient described reasonable intake, adequate fruits and vegetables and baked foods Protein sources are mostly non meat- nuts, yogurt, beans, milk, peanut butter, cheese  Recent physical activity: walks during ADLs and sometimes with daughter  Estimated daily energy needs: ~ 1400-1700 calories ~200 g carbohydrates   Progress Towards Goal(s):  In progress.   Nutritional Diagnosis:  [place]-2.2 Altered nutrition-related laboratory and [place]-2.4 Predicted food-medication interaction As related to lack of adequate blood sugar control slightly improved, but still not acceptable to patient  As evidenced by A1C drop from 10.4 to 10.2.    Intervention:  Nutrition education on new meter provided to patient today, health coaching about goal setting and patients preferences regarding her diabetes self care.  coordination of care- discuss titration of prandin with  physician Handouts given during visit include:AVS Monitoring/Evaluation:  Dietary intake, exercise, meter, and body weight in 2 week(s).

## 2014-10-18 NOTE — Patient Instructions (Signed)
-   It was a pleasure seeing you today - We will refill your testing strips and lancets for your blood glucose meter - We will check your bloodwork today - Please complete your antibiotic course and follow up with your orthopedic doctor - We will try and arrange for a bone scan for you to check for osteoprosis - We will give you a flu shot today

## 2014-10-18 NOTE — Progress Notes (Signed)
   Subjective:    Patient ID: [name], female    DOB: 1950-06-26, 64 y.o.   MRN: [id number]  HPI Patient presents today for routine follow up of her DM and for recent left thumb infection. She does complain of episodes of hypoglycemia noted on her meter download and also has symptoms of occasional lightheadedness and tingling/numbness likely secondary to hypoglycemia as well. She states that she feels that 55 units of levemir may be too much and she sometimes only uses 50 units.Her A1c has improved form the 10s to the 9s and and she has lost 10 lbs since her last visit.    Review of Systems  Constitutional: Negative.   HENT: Negative.   Eyes: Negative.   Respiratory: Negative.   Cardiovascular: Negative.   Gastrointestinal: Negative.   Endocrine: Negative.   Musculoskeletal: Positive for gait problem. Negative for arthralgias.       Patient with intermittent episodes of lightheadedness and weakness assoc with tingling and numbness  Skin: Negative.   Neurological: Positive for light-headedness and numbness. Negative for tremors, syncope, facial asymmetry and weakness.  Psychiatric/Behavioral: Negative.        Objective:   Physical Exam  Constitutional: She is oriented to person, place, and time. She appears well-developed and well-nourished.  HENT:  Head: Normocephalic and atraumatic.  Eyes: Conjunctivae are normal.  Neck: Normal range of motion.  Cardiovascular: Normal rate, regular rhythm and normal heart sounds.   Pulmonary/Chest: Effort normal and breath sounds normal. No respiratory distress. She has no wheezes.  Abdominal: Soft. Bowel sounds are normal. She exhibits no distension. There is no tenderness.  Musculoskeletal: Normal range of motion. She exhibits edema. She exhibits no tenderness.  Patient with R UE lymphedema - much improved.  Neurological: She is alert and oriented to person, place, and time.  Skin: Skin is warm. No rash noted. No erythema.    Psychiatric: She has a normal mood and affect. Her behavior is normal.          Assessment & Plan:  Please see problem based charting for assessment and plan:

## 2014-10-18 NOTE — Assessment & Plan Note (Signed)
Lab Results  Component Value Date   HGBA1C 9.6 10/18/2014   HGBA1C 10.2 05/16/2014   HGBA1C 10.4 03/24/2014     Assessment: Diabetes control:  poorly controlled Progress toward A1C goal:   improved Comments: patient compliant with meds and has episodes of hypoglycemia with her current regimen.  Plan: Medications:  continue current medications, Will decrease levemir to 50 units  Home glucose monitoring: Frequency:   Timing:   Instruction/counseling given: reminded to bring blood glucose meter & log to each visit and discussed the need for weight loss Educational resources provided:   Self management tools provided:   Other plans: Will refill her test strips and lancets and encourage her to check her sugars 3-4 times and follow up in 2 weeks

## 2014-10-18 NOTE — Patient Instructions (Signed)
Your weight is good at 140#-145# for your height and health.   You are doing a good job eating protein and calcium. The information below is for your review.   Your a1C is better at 9.6%. This means your average blood sugars has been about 229 for the past 3 months.   Now that you are feeling better, consider checking more frequently to catch the high blood sugars so you can do something about them.  You are headed towards your goal of 7%!!!     Vegetarian Eating and Nutrition Vegetarian diets are designed for those people who prefer vegetarian eating for religious, ecologic, or personal reasons. These diets, which are often lower in cholesterol and saturated fats, can provide significant health benefits. They result in lower rates of obesity, diabetes, breast and colon cancers, and cardiovascular and gallbladder diseases.    WHAT DO I NEED TO KNOW ABOUT VEGETARIAN EATING AND NUTRITION? The following nutrients are found in animal products. It is important to make sure you get enough of these nutrients from your diet. If you think you are not getting the right nutrients or if you do not eat any animal products, talk with your health care provider or dietitian about taking supplements.  Protein  Sources of protein include:  Beans, such as black beans or kidney beans, or other legumes, such as lentils and split peas. Soy products. Nuts, such as almonds, [place] nuts, and pecans. Seeds, such as sunflower seeds. Tofu, tempeh, and hummus. Eggs, milk, and cheese.  Vitamin B12 This vitamin is only found in:  Cheeses, fish, and eggs. It can also be found in some breakfast cereals and other prepared products that have added vitamin B12. If you eat no animal products, you should discuss taking a supplement with your health care provider or dietitian.  Vitamin D Good sources of vitamin D include:  Cod liver oil. Fish, such as swordfish, salmon, and tuna. Dairy products. Orange juice. Fortified  mushrooms. Cereals with added vitamin D.  Iron Good sources of iron include:  Dark, leafy greens. Nuts. Beans. Grain products that have added iron, such as cereals. Tofu, tempeh, soybeans, and quinoa. Plant-based iron is better absorbed when eaten with vitamin C. For example, squeeze lemon juice over cooked greens like kale, chard, or spinach, or have a glass of orange juice with your meals.  Omega-3 Fatty Acids Good sources of omega-3 fatty acids include:  Walnuts. Foods with added omega-3 fatty acids, such as eggs, milk, and juices. Flax seeds, canola oil, soybean oil, and tofu. Fish (cold water fatty fish such as salmon, sardines, mackerel, herring, lake trout, and albacore tuna).  Calcium Good sources of calcium include:  Dark, leafy greens, such as kale, bok choy, Chinese cabbage, collard greens and mustard greens. Broccoli. Okra. Dairy products. Soy products with added calcium. Calcium-fortified breakfast cereals, calcium-fortified fruit juices, and figs.  Zinc Good sources of zinc include:  Legumes. Dairy products. Wheat germ, cereals, and breads that have added zinc. Baked beans. Legumes, such as cashews, chickpeas, kidney beans, and green peas. Almonds and nut butters. Tofu and other soy products.

## 2014-10-18 NOTE — Assessment & Plan Note (Signed)
-   Will follow up lipid panel - Will consider switching to atorvastatin to 40 mg depending on lipid panel

## 2014-10-18 NOTE — Assessment & Plan Note (Signed)
BP Readings from Last 3 Encounters:  10/18/14 125/67  09/09/14 125/89  09/07/14 102/55    Lab Results  Component Value Date   NA 142 09/09/2014   K 3.1* 09/09/2014   CREATININE 1.10* 09/09/2014    Assessment: Blood pressure control:  well controlled Progress toward BP goal:   at goal Comments: patient compliant with meds  Plan: Medications:  continue current medications Educational resources provided:   Self management tools provided:   Other plans: Will check lipid panel

## 2014-10-18 NOTE — Telephone Encounter (Signed)
Request diabetes supplies rx be sent to [facility]- strips lancets and pen needles for once a day insulin injections and twice day testing. Triage nurse to enter as EPIC not allowing CDE to enter and route,

## 2014-10-19 LAB — HEPATITIS C ANTIBODY (REFLEX): HCV Ab: <0.1 s/co ratio (ref 0.0–0.9)

## 2014-10-19 LAB — LIPID PANEL
Chol/HDL Ratio: 3.8 ratio units (ref 0.0–4.4)
Cholesterol, Total: 167 mg/dL (ref 100–199)
HDL: 44 mg/dL (ref >39)
LDL Calculated: 105 mg/dL — ABNORMAL HIGH (ref 0–99)
Triglycerides: 89 mg/dL (ref 0–149)
VLDL Cholesterol Cal: 18 mg/dL (ref 5–40)

## 2014-10-19 LAB — HCV COMMENT:

## 2014-10-19 MED ORDER — GLUCOSE BLOOD VI STRP: ORAL_STRIP | Status: DC

## 2014-10-19 MED ORDER — INSULIN PEN NEEDLE 32G X 4 MM MISC: Status: DC

## 2014-10-19 MED ORDER — ONETOUCH ULTRASOFT LANCETS MISC: Status: DC

## 2014-10-19 NOTE — Telephone Encounter (Signed)
Thank you :)

## 2014-10-24 ENCOUNTER — Other Ambulatory Visit: Payer: Medicare Other

## 2014-10-27 ENCOUNTER — Other Ambulatory Visit: Payer: Self-pay | Admitting: *Deleted

## 2014-10-28 MED ORDER — VENLAFAXINE HCL ER 75 MG PO CP24: ORAL_CAPSULE | ORAL | Status: DC

## 2014-11-04 ENCOUNTER — Encounter (INDEPENDENT_AMBULATORY_CARE_PROVIDER_SITE_OTHER): Payer: Medicare Other | Admitting: Ophthalmology

## 2014-11-07 ENCOUNTER — Encounter (INDEPENDENT_AMBULATORY_CARE_PROVIDER_SITE_OTHER): Payer: Medicare Other | Admitting: Ophthalmology

## 2014-11-07 DIAGNOSIS — E113313 Type 2 diabetes mellitus with moderate nonproliferative diabetic retinopathy with macular edema, bilateral: Secondary | ICD-10-CM | POA: Diagnosis not present

## 2014-11-07 DIAGNOSIS — H43813 Vitreous degeneration, bilateral: Secondary | ICD-10-CM | POA: Diagnosis not present

## 2014-11-07 DIAGNOSIS — H35033 Hypertensive retinopathy, bilateral: Secondary | ICD-10-CM

## 2014-11-07 DIAGNOSIS — E11311 Type 2 diabetes mellitus with unspecified diabetic retinopathy with macular edema: Secondary | ICD-10-CM | POA: Diagnosis not present

## 2014-11-07 DIAGNOSIS — I10 Essential (primary) hypertension: Secondary | ICD-10-CM | POA: Diagnosis not present

## 2014-11-08 ENCOUNTER — Telehealth: Payer: Self-pay | Admitting: Dietician

## 2014-11-08 ENCOUNTER — Encounter: Payer: Self-pay | Admitting: Internal Medicine

## 2014-11-08 ENCOUNTER — Ambulatory Visit (INDEPENDENT_AMBULATORY_CARE_PROVIDER_SITE_OTHER): Payer: Medicare Other | Admitting: Internal Medicine

## 2014-11-08 VITALS — BP 142/70 | HR 87 | Temp 97.5°F | Ht 63.0 in | Wt 139.2 lb

## 2014-11-08 DIAGNOSIS — E1149 Type 2 diabetes mellitus with other diabetic neurological complication: Secondary | ICD-10-CM

## 2014-11-08 DIAGNOSIS — Z794 Long term (current) use of insulin: Secondary | ICD-10-CM

## 2014-11-08 DIAGNOSIS — I10 Essential (primary) hypertension: Secondary | ICD-10-CM

## 2014-11-08 LAB — GLUCOSE, CAPILLARY: Glucose-Capillary: 141 mg/dL — ABNORMAL HIGH (ref 65–99)

## 2014-11-08 NOTE — Telephone Encounter (Signed)
Patient asks if she should have to pay for her testing supplies even though she has both Medicare and Medicaid. Also, she got the wrong lancets. CDE called her pharmacy- [facility]- they will take th lancets back and give her the correct ones and they say they do have both her cards and that she does have to pay some for her testing supplies- the strips were 6.57 for 200 strips and her lancets were 57 cents.

## 2014-11-08 NOTE — Patient Instructions (Addendum)
-   It was a pleasure seeing you today - Please decrease your levemir to 45 units daily - Continue with your other diabetic medications - Your BP is slightly high today. We will continue your current medications and reassess at next visit

## 2014-11-08 NOTE — Progress Notes (Signed)
   Subjective:    Patient ID: [name], female    DOB: 1951/01/21, 64 y.o.   MRN: [id number]  HPI Patient seen and examined. She presents today for routine follow up of her DM and HTN. Patient states she feels well. She is still on antibiotics for left thumb infection s/p I and D. She is being followed by ortho- Dr. Fredna Dow.No fevers, no pain in thumb. Surgical site is healing well. She does note episodes of hypoglycemia on current diabetes regimen. Blood sugars reviewed - patient with highs in 200s and lows in 60s.   Of note she had injections into both eyes yesterday for diabetic retinopathy.    Review of Systems  Constitutional: Positive for appetite change. Negative for fever, chills, diaphoresis, activity change and fatigue.       Complains of mildly decreased appetite while on antibiotics  HENT: Negative.   Eyes: Positive for photophobia.       Complains of mild photophobia secondary to recent optho procedure  Respiratory: Negative.   Cardiovascular: Negative.   Gastrointestinal: Negative.   Musculoskeletal: Negative.   Skin: Negative.   Neurological: Negative.   Psychiatric/Behavioral: Negative.        Objective:   Physical Exam  Constitutional: She is oriented to person, place, and time. She appears well-developed and well-nourished.  HENT:  Head: Normocephalic and atraumatic.  Neck: Normal range of motion.  Cardiovascular: Normal rate, regular rhythm and normal heart sounds.   Pulmonary/Chest: Effort normal and breath sounds normal. No respiratory distress. She has no wheezes.  Abdominal: Soft. Bowel sounds are normal. She exhibits no distension. There is no tenderness.  Musculoskeletal: Normal range of motion. She exhibits no edema.  Neurological: She is alert and oriented to person, place, and time.  Skin: Skin is warm and dry.  Psychiatric: She has a normal mood and affect. Her behavior is normal.          Assessment & Plan:  Please see problem based  charting for assessment and plan:

## 2014-11-08 NOTE — Assessment & Plan Note (Signed)
Lab Results  Component Value Date   HGBA1C 9.6 10/18/2014   HGBA1C 10.2 05/16/2014   HGBA1C 10.4 03/24/2014     Assessment: Diabetes control:  poor Progress toward A1C goal:    Comments: patient noted to have BS ranging form 60s to 200s  Plan: Medications:  Will decrease levemir to 45 units given am hypoglycemia Home glucose monitoring: Frequency:   Timing:   Instruction/counseling given: reminded to bring blood glucose meter & log to each visit and discussed diet Educational resources provided:   Self management tools provided: copy of home glucose meter download Other plans: Patient to continue metformin and prandin. May benefit from adding SGLT2 inhibitor or DPP4 inhibitor if no improvement in A1C

## 2014-11-08 NOTE — Assessment & Plan Note (Signed)
BP Readings from Last 3 Encounters:  11/08/14 142/70  10/18/14 125/67  09/09/14 125/89    Lab Results  Component Value Date   NA 142 09/09/2014   K 3.1* 09/09/2014   CREATININE 1.10* 09/09/2014    Assessment: Blood pressure control:  mildly elevated Progress toward BP goal:   deteriorated Comments: patient is compliant with meds. Possibly elevated from pain from optho procedure yesterday  Plan: Medications:  continue current medications Educational resources provided:   Self management tools provided:   Other plans: Will reassess on follow up. Goal BP is 130/90 given history of DM

## 2014-11-17 ENCOUNTER — Other Ambulatory Visit: Payer: Self-pay | Admitting: Internal Medicine

## 2014-11-17 DIAGNOSIS — M159 Polyosteoarthritis, unspecified: Secondary | ICD-10-CM

## 2014-11-24 ENCOUNTER — Telehealth: Payer: Self-pay | Admitting: Internal Medicine

## 2014-11-24 NOTE — Telephone Encounter (Signed)
Cannot find this on medlist, please advise

## 2014-11-24 NOTE — Telephone Encounter (Signed)
Pt requesting acetaminophen Q-pap to be filled.

## 2014-11-25 NOTE — Telephone Encounter (Signed)
I don't see it in her med history either.I will be unable to refill this medication. If her pain is not controlled please ask her to come to the clinic for further evaluation

## 2014-11-28 ENCOUNTER — Other Ambulatory Visit: Payer: Self-pay | Admitting: Internal Medicine

## 2014-11-28 NOTE — Telephone Encounter (Signed)
Have called pt 3 times today, lm for rtc

## 2014-11-28 NOTE — Telephone Encounter (Signed)
PT HAS QUESTIONS ABOUT MEDICATION REFILLS, PLEASE CALL HER CELL

## 2014-11-28 NOTE — Telephone Encounter (Signed)
Called pt, no answer, lm to rtc asap

## 2014-11-30 ENCOUNTER — Other Ambulatory Visit: Payer: Self-pay | Admitting: *Deleted

## 2014-11-30 ENCOUNTER — Other Ambulatory Visit: Payer: Self-pay

## 2014-11-30 DIAGNOSIS — M159 Polyosteoarthritis, unspecified: Secondary | ICD-10-CM

## 2014-11-30 DIAGNOSIS — H401131 Primary open-angle glaucoma, bilateral, mild stage: Secondary | ICD-10-CM | POA: Diagnosis not present

## 2014-11-30 DIAGNOSIS — H11153 Pinguecula, bilateral: Secondary | ICD-10-CM | POA: Diagnosis not present

## 2014-11-30 DIAGNOSIS — H04123 Dry eye syndrome of bilateral lacrimal glands: Secondary | ICD-10-CM | POA: Diagnosis not present

## 2014-11-30 DIAGNOSIS — Z9849 Cataract extraction status, unspecified eye: Secondary | ICD-10-CM | POA: Diagnosis not present

## 2014-11-30 DIAGNOSIS — H18413 Arcus senilis, bilateral: Secondary | ICD-10-CM | POA: Diagnosis not present

## 2014-11-30 DIAGNOSIS — Z961 Presence of intraocular lens: Secondary | ICD-10-CM | POA: Diagnosis not present

## 2014-11-30 MED ORDER — ACETAMINOPHEN 325 MG PO TABS: 325.0000 mg | ORAL_TABLET | Freq: Three times a day (TID) | ORAL | Status: DC | PRN

## 2014-11-30 NOTE — Telephone Encounter (Signed)
Spoke to pt, she wants reg tylenol by script due to cost

## 2014-11-30 NOTE — Telephone Encounter (Signed)
She says it is regular tylenol that she wants but wants it by script because it is cheaper for her that way

## 2014-12-05 ENCOUNTER — Other Ambulatory Visit: Payer: Self-pay | Admitting: Internal Medicine

## 2014-12-08 ENCOUNTER — Ambulatory Visit: Payer: Medicare Other | Admitting: Podiatry

## 2014-12-08 ENCOUNTER — Encounter (INDEPENDENT_AMBULATORY_CARE_PROVIDER_SITE_OTHER): Payer: Medicare Other | Admitting: Ophthalmology

## 2014-12-08 DIAGNOSIS — E113391 Type 2 diabetes mellitus with moderate nonproliferative diabetic retinopathy without macular edema, right eye: Secondary | ICD-10-CM | POA: Diagnosis not present

## 2014-12-08 DIAGNOSIS — H43813 Vitreous degeneration, bilateral: Secondary | ICD-10-CM

## 2014-12-08 DIAGNOSIS — H35033 Hypertensive retinopathy, bilateral: Secondary | ICD-10-CM | POA: Diagnosis not present

## 2014-12-08 DIAGNOSIS — I10 Essential (primary) hypertension: Secondary | ICD-10-CM | POA: Diagnosis not present

## 2014-12-08 DIAGNOSIS — E11311 Type 2 diabetes mellitus with unspecified diabetic retinopathy with macular edema: Secondary | ICD-10-CM | POA: Diagnosis not present

## 2014-12-08 DIAGNOSIS — E113312 Type 2 diabetes mellitus with moderate nonproliferative diabetic retinopathy with macular edema, left eye: Secondary | ICD-10-CM | POA: Diagnosis not present

## 2014-12-17 ENCOUNTER — Other Ambulatory Visit: Payer: Self-pay | Admitting: Internal Medicine

## 2014-12-29 ENCOUNTER — Ambulatory Visit: Payer: Medicare Other | Admitting: Podiatry

## 2015-01-04 ENCOUNTER — Encounter (INDEPENDENT_AMBULATORY_CARE_PROVIDER_SITE_OTHER): Payer: Medicare Other | Admitting: Ophthalmology

## 2015-01-04 DIAGNOSIS — E113313 Type 2 diabetes mellitus with moderate nonproliferative diabetic retinopathy with macular edema, bilateral: Secondary | ICD-10-CM | POA: Diagnosis not present

## 2015-01-04 DIAGNOSIS — H43813 Vitreous degeneration, bilateral: Secondary | ICD-10-CM | POA: Diagnosis not present

## 2015-01-04 DIAGNOSIS — H35033 Hypertensive retinopathy, bilateral: Secondary | ICD-10-CM | POA: Diagnosis not present

## 2015-01-04 DIAGNOSIS — E11311 Type 2 diabetes mellitus with unspecified diabetic retinopathy with macular edema: Secondary | ICD-10-CM

## 2015-01-04 DIAGNOSIS — I10 Essential (primary) hypertension: Secondary | ICD-10-CM

## 2015-01-05 ENCOUNTER — Ambulatory Visit (INDEPENDENT_AMBULATORY_CARE_PROVIDER_SITE_OTHER): Payer: Medicare Other | Admitting: Podiatry

## 2015-01-05 ENCOUNTER — Encounter: Payer: Self-pay | Admitting: Podiatry

## 2015-01-05 DIAGNOSIS — M79676 Pain in unspecified toe(s): Secondary | ICD-10-CM | POA: Diagnosis not present

## 2015-01-05 DIAGNOSIS — B351 Tinea unguium: Secondary | ICD-10-CM | POA: Diagnosis not present

## 2015-01-05 NOTE — Progress Notes (Signed)
Patient ID: Daijah Yankee, female   DOB: 1950-05-31, 64 y.o.   MRN: [id number] Complaint:  Visit Type: Patient returns to my office for continued preventative foot care services. Complaint: Patient states" my nails have grown long and thick and become painful to walk and wear shoes" Patient has been diagnosed with DM with no foot complications. The patient presents for preventative foot care services. No changes to ROS  Podiatric Exam: Vascular: dorsalis pedis and posterior tibial pulses are palpable bilateral. Capillary return is immediate. Temperature gradient is WNL. Skin turgor WNL  Sensorium: Normal Semmes Weinstein monofilament test. Normal tactile sensation bilaterally. Nail Exam: Pt has thick disfigured discolored nails with subungual debris noted bilateral entire nail hallux through fifth toenails Ulcer Exam: There is no evidence of ulcer or pre-ulcerative changes or infection. Orthopedic Exam: Muscle tone and strength are WNL. No limitations in general ROM. No crepitus or effusions noted. Foot type and digits show no abnormalities. Bony prominences are unremarkable. Skin: No Porokeratosis. No infection or ulcers.  [place] durum fifth toes B/L s/p surgery by [place] Ishikawa  Diagnosis:  Onychomycosis, , Pain in right toe, pain in left toes  Treatment & Plan Procedures and Treatment: Consent by patient was obtained for treatment procedures. The patient understood the discussion of treatment and procedures well. All questions were answered thoroughly reviewed. Debridement of mycotic and hypertrophic toenails, 1 through 5 bilateral and clearing of subungual debris. No ulceration, no infection noted.  Return Visit-Office Procedure: Patient instructed to return to the office for a follow up visit 3 months for continued evaluation and treatment.  [name] DPM

## 2015-01-13 ENCOUNTER — Other Ambulatory Visit: Payer: Self-pay | Admitting: Internal Medicine

## 2015-01-13 DIAGNOSIS — I83892 Varicose veins of left lower extremities with other complications: Secondary | ICD-10-CM | POA: Diagnosis not present

## 2015-01-13 DIAGNOSIS — I8312 Varicose veins of left lower extremity with inflammation: Secondary | ICD-10-CM | POA: Diagnosis not present

## 2015-01-13 NOTE — Telephone Encounter (Signed)
Has PCP appt Feb

## 2015-01-16 DIAGNOSIS — I8312 Varicose veins of left lower extremity with inflammation: Secondary | ICD-10-CM | POA: Diagnosis not present

## 2015-01-29 HISTORY — PX: CARPAL TUNNEL RELEASE: SHX101

## 2015-01-29 HISTORY — PX: CATARACT EXTRACTION W/ INTRAOCULAR LENS  IMPLANT, BILATERAL: SHX1307

## 2015-02-01 ENCOUNTER — Encounter (INDEPENDENT_AMBULATORY_CARE_PROVIDER_SITE_OTHER): Payer: Medicare Other | Admitting: Ophthalmology

## 2015-02-02 DIAGNOSIS — I8312 Varicose veins of left lower extremity with inflammation: Secondary | ICD-10-CM | POA: Diagnosis not present

## 2015-02-03 ENCOUNTER — Encounter (INDEPENDENT_AMBULATORY_CARE_PROVIDER_SITE_OTHER): Payer: Medicare Other | Admitting: Ophthalmology

## 2015-02-03 DIAGNOSIS — I10 Essential (primary) hypertension: Secondary | ICD-10-CM

## 2015-02-03 DIAGNOSIS — E113391 Type 2 diabetes mellitus with moderate nonproliferative diabetic retinopathy without macular edema, right eye: Secondary | ICD-10-CM

## 2015-02-03 DIAGNOSIS — E113312 Type 2 diabetes mellitus with moderate nonproliferative diabetic retinopathy with macular edema, left eye: Secondary | ICD-10-CM

## 2015-02-03 DIAGNOSIS — H43813 Vitreous degeneration, bilateral: Secondary | ICD-10-CM

## 2015-02-03 DIAGNOSIS — H35033 Hypertensive retinopathy, bilateral: Secondary | ICD-10-CM | POA: Diagnosis not present

## 2015-02-03 DIAGNOSIS — E11311 Type 2 diabetes mellitus with unspecified diabetic retinopathy with macular edema: Secondary | ICD-10-CM | POA: Diagnosis not present

## 2015-02-16 DIAGNOSIS — I8312 Varicose veins of left lower extremity with inflammation: Secondary | ICD-10-CM | POA: Diagnosis not present

## 2015-02-18 ENCOUNTER — Other Ambulatory Visit: Payer: Self-pay | Admitting: Internal Medicine

## 2015-02-21 DIAGNOSIS — K5901 Slow transit constipation: Secondary | ICD-10-CM | POA: Diagnosis not present

## 2015-02-21 DIAGNOSIS — Z86010 Personal history of colonic polyps: Secondary | ICD-10-CM | POA: Diagnosis not present

## 2015-02-24 ENCOUNTER — Ambulatory Visit (HOSPITAL_COMMUNITY)
Admission: RE | Admit: 2015-02-24 | Discharge: 2015-02-24 | Disposition: A | Discharge status: Home / Self Care | Payer: Medicare Other | Source: Ambulatory Visit | Attending: Internal Medicine | Admitting: Internal Medicine

## 2015-02-24 ENCOUNTER — Ambulatory Visit (INDEPENDENT_AMBULATORY_CARE_PROVIDER_SITE_OTHER): Payer: Medicare Other | Admitting: Internal Medicine

## 2015-02-24 ENCOUNTER — Encounter: Payer: Self-pay | Admitting: Internal Medicine

## 2015-02-24 VITALS — BP 134/74 | HR 97 | Temp 98.5°F | Ht 63.0 in | Wt 147.0 lb

## 2015-02-24 DIAGNOSIS — I8312 Varicose veins of left lower extremity with inflammation: Secondary | ICD-10-CM | POA: Diagnosis not present

## 2015-02-24 DIAGNOSIS — E1149 Type 2 diabetes mellitus with other diabetic neurological complication: Secondary | ICD-10-CM

## 2015-02-24 DIAGNOSIS — M79605 Pain in left leg: Secondary | ICD-10-CM | POA: Diagnosis not present

## 2015-02-24 DIAGNOSIS — M25552 Pain in left hip: Secondary | ICD-10-CM | POA: Insufficient documentation

## 2015-02-24 DIAGNOSIS — R634 Abnormal weight loss: Secondary | ICD-10-CM | POA: Diagnosis not present

## 2015-02-24 DIAGNOSIS — M25551 Pain in right hip: Secondary | ICD-10-CM | POA: Insufficient documentation

## 2015-02-24 DIAGNOSIS — Z0000 Encounter for general adult medical examination without abnormal findings: Secondary | ICD-10-CM

## 2015-02-24 DIAGNOSIS — R1030 Lower abdominal pain, unspecified: Secondary | ICD-10-CM | POA: Insufficient documentation

## 2015-02-24 HISTORY — DX: Pain in right hip: M25.551

## 2015-02-24 HISTORY — DX: Pain in left hip: M25.552

## 2015-02-24 LAB — GLUCOSE, CAPILLARY: Glucose-Capillary: 80 mg/dL (ref 65–99)

## 2015-02-24 LAB — POCT GLYCOSYLATED HEMOGLOBIN (HGB A1C): Hemoglobin A1C: 8.1

## 2015-02-24 NOTE — Assessment & Plan Note (Signed)
A/P: Unilateral anterior groin pain worsened with hip flexion most c/f hip OA.  Will evaluate with left hip X-rays.  If positive, patient will be referred for PT.  If negative, will continue symptomatic therapy and monitor. - Meloxicam 15 mg once daily for 1 week - Hip X-rays with PT referral if c/f OA.

## 2015-02-24 NOTE — Assessment & Plan Note (Signed)
Patient complains of 20 lb weight loss but no red flag symptoms.  She attributes loss to poor appetite and decreased taste, which would be inconsistent with hypermetabolic state a/w metastatic breast cancer.  Recheck weight at next visit.

## 2015-02-24 NOTE — Progress Notes (Signed)
Patient ID: [name], female   DOB: 08-Oct-1950, 65 y.o.   MRN: [id number]   Subjective:   Patient ID: [name] female   DOB: 03/14/1950 65 y.o.   MRN: [id number]  HPI: [name] is a 65 y.o. female with PMH as below, here for evaluation of left groin pain.  Please see Problem-Based charting for the status of the patient's chronic medical issues.  Patient states new onset left groin pain starting Sunday, described as sharp, shooting pains down anterior thigh.  Pains are intermittent but can last.  It is relieved with tylenol and lying on the left side.  Pain is worsened with bending, standing, or sitting too long.   She has a h/o severe spinal stenosis seen on MRI in 2011.  She endorses occasional bilateral leg weakness 1-2x/week.  She occasionally has numbness of the feet.  She denies loss of bowel/bladder control.   She denies fever, chills, or night sweats.  She has had a 20 lb weight loss over the last 7 months.  She attributes this to decreased appetite and food not tasting good after receiving antibiotics for a dog bite of her hand.  Her last colonoscopy was 2014.  She is s/p hysterectomy.     Past Medical History  Diagnosis Date  . Allergy   . Anemia   . Depression   . Arthritis   . Cancer ([place])   . Cataract   . Diabetes mellitus without complication ([place])   . Glaucoma   . Hyperlipidemia   . Hypertension   . Chronic kidney disease   . Neuromuscular disorder ([place])   . Thyroid disease   . Left arm pain 07/28/2012  . BREAST CANCER, HX OF 04/16/2006    Qualifier: Diagnosis of  By: Leward Quan MD, Pamala Hurry    . Diverticulosis   . Hypothyroidism   . Lymphedema of upper extremity     right   Current Outpatient Prescriptions  Medication Sig Dispense Refill  . acetaminophen (TYLENOL) 325 MG tablet Take 1 tablet (325 mg total) by mouth every 8 (eight) hours as needed (pain). 90 tablet 1  . amLODipine (NORVASC) 10 MG tablet Take 1 tablet (10 mg total) by mouth daily. 30  tablet 11  . aspirin EC 81 MG tablet Take 81 mg by mouth at bedtime.    . Cholecalciferol (VITAMIN D-3) 1000 UNITS CAPS Take 2,000 Units by mouth daily.     . clobetasol cream (TEMOVATE) [id number] % Apply 1 application topically 2 (two) times daily as needed (rash).     . cycloSPORINE (RESTASIS) 0.05 % ophthalmic emulsion Place 1 drop into both eyes 2 (two) times daily.    [name] gabapentin (NEURONTIN) 100 MG capsule TAKE 1 CAPSULE BY MOUTH AT BEDTIME 90 capsule 3  . glucose 4 GM chewable tablet Chew 4 tablets (16 g total) by mouth as needed for low blood sugar. 50 tablet 12  . glucose blood test strip Use 2 times  daily to check blood sugar. diag code E11.40. Insulin dependent 200 each 3  . hydrochlorothiazide (MICROZIDE) 12.5 MG capsule TAKE 1 CAPSULE BY MOUTH EVERY DAY 90 capsule 3  . Insulin Pen Needle (BD PEN NEEDLE NANO U/F) 32G X 4 MM MISC Use as directed 100 each 3  . Insulin Syringe-Needle U-100 (B-D INS SYR ULTRAFINE .5CC/30G) 30G X 1/2" 0.5 ML MISC To use 4 times daily for injecting insulin. diag code 250.42. Insulin dependent 150 each 6  . Lancets (ONETOUCH ULTRASOFT) lancets Use to check blood  sugar 2 time daily. diag code E11.40. Insulin dependent 180 each 3  . LEVEMIR FLEXTOUCH 100 UNIT/ML Pen INJECT 45 UNITS SUBCUTANEOUSLY AT BEDTIME 15 mL 6  . levofloxacin (LEVAQUIN) 750 MG tablet Take 1 tablet (750 mg total) by mouth daily. 10 tablet 0  . levothyroxine (SYNTHROID, LEVOTHROID) 75 MCG tablet TAKE 1 TABLET BY MOUTH DAILY BEFORE BREAKFAST 30 tablet 3  . meloxicam (MOBIC) 15 MG tablet Take 15 mg by mouth daily as needed for pain (pain).     . metFORMIN (GLUCOPHAGE) 1000 MG tablet TAKE 1 TABLET BY MOUTH TWICE DAILY WITH MEALS 60 tablet 6  . Polyvinyl Alcohol-Povidone (REFRESH OP) Place 1 drop into both eyes 3 (three) times daily.    . pravastatin (PRAVACHOL) 40 MG tablet TAKE 1 TABLET BY MOUTH DAILY 90 tablet 0  . repaglinide (PRANDIN) 1 MG tablet TAKE 1 TABLET BY MOUTH THREE TIMES DAILY BEFORE  A MEAL 270 tablet 0  . venlafaxine XR (EFFEXOR-XR) 75 MG 24 hr capsule TAKE 1 CAPSULE BY MOUTH EVERY DAY 30 capsule 3  . zoster vaccine live, PF, (ZOSTAVAX) 29562 UNT/0.65ML injection Inject 19,400 Units into the skin once. 1 each 0   No current facility-administered medications for this visit.   Family History  Problem Relation Age of Onset  . Heart disease Father   . Diabetes Father   . Stroke Sister   . Anuerysm Sister   . Heart disease Brother   . Anuerysm Brother   . Cancer Daughter     breast ca   Social History   Social History  . Marital Status: Divorced    Spouse Name: N/A  . Number of Children: N/A  . Years of Education: N/A   Social History Main Topics  . Smoking status: Never Smoker   . Smokeless tobacco: Never Used  . Alcohol Use: No  . Drug Use: No  . Sexual Activity: Not Asked   Other Topics Concern  . None   Social History Narrative   Review of Systems: Pertinent items are noted in HPI. Objective:  Physical Exam: Filed Vitals:   02/24/15 1447  BP: 134/74  Pulse: 97  Temp: 98.5 F (36.9 C)  TempSrc: Oral  Height: 5\' 3"  (1.6 m)  Weight: 147 lb (66.679 kg)  SpO2: 100%   Physical Exam  Constitutional: She is oriented to person, place, and time and well-developed, well-nourished, and in no distress. No distress.  HENT:  Head: Normocephalic and atraumatic.  Eyes: EOM are normal. No scleral icterus.  Neck: No tracheal deviation present.  Cardiovascular: Normal rate, regular rhythm and normal heart sounds.   Pulmonary/Chest: Effort normal and breath sounds normal. No stridor. No respiratory distress. She has no wheezes.  Musculoskeletal:  No swelling. Left groin pain recreated with hip flexion.    Neurological: She is alert and oriented to person, place, and time.  Straight leg positive bilaterally.  Patellar reflexes 2+ and symmetric.  Skin: Skin is warm and dry. She is not diaphoretic.     Assessment & Plan:   Patient and case were  discussed with Dr. Evette Doffing.  Please refer to Problem Based charting for further documentation.

## 2015-02-24 NOTE — Patient Instructions (Signed)
1. Take Meloxicam 15 mg by mouth once daily fo rthe next week. 2. We are going to get Xrays. 3. Return to clinic in 3 months.  Hip Pain Your hip is the joint between your upper legs and your lower pelvis. The bones, cartilage, tendons, and muscles of your hip joint perform a lot of work each day supporting your body weight and allowing you to move around. Hip pain can range from a minor ache to severe pain in one or both of your hips. Pain may be felt on the inside of the hip joint near the groin, or the outside near the buttocks and upper thigh. You may have swelling or stiffness as well.  HOME CARE INSTRUCTIONS   Take medicines only as directed by your health care provider.  Apply ice to the injured area:  Put ice in a plastic bag.  Place a towel between your skin and the bag.  Leave the ice on for 15-20 minutes at a time, 3-4 times a day.  Keep your leg raised (elevated) when possible to lessen swelling.  Avoid activities that cause pain.  Follow specific exercises as directed by your health care provider.  Sleep with a pillow between your legs on your most comfortable side.  Record how often you have hip pain, the location of the pain, and what it feels like. SEEK MEDICAL CARE IF:   You are unable to put weight on your leg.  Your hip is red or swollen or very tender to touch.  Your pain or swelling continues or worsens after 1 week.  You have increasing difficulty walking.  You have a fever. SEEK IMMEDIATE MEDICAL CARE IF:   You have fallen.  You have a sudden increase in pain and swelling in your hip. MAKE SURE YOU:   Understand these instructions.  Will watch your condition.  Will get help right away if you are not doing well or get worse.   This information is not intended to replace advice given to you by your health care provider. Make sure you discuss any questions you have with your health care provider.   Document Released: 07/04/2009 Document  Revised: 02/04/2014 Document Reviewed: 09/10/2012 Elsevier Interactive Patient Education [employer].

## 2015-02-27 NOTE — Progress Notes (Signed)
Internal Medicine Clinic Attending  Case discussed with Dr. Lovena Le at the time of the visit.  We reviewed the resident's history and exam and pertinent patient test results.  I agree with the assessment, diagnosis, and plan of care documented in the resident's note.  Hip xrays are reassuring there is minimal osteoarthritis of the femoral head.

## 2015-03-02 ENCOUNTER — Other Ambulatory Visit: Payer: Self-pay | Admitting: Internal Medicine

## 2015-03-03 ENCOUNTER — Encounter (INDEPENDENT_AMBULATORY_CARE_PROVIDER_SITE_OTHER): Payer: Medicare Other | Admitting: Ophthalmology

## 2015-03-06 ENCOUNTER — Encounter (INDEPENDENT_AMBULATORY_CARE_PROVIDER_SITE_OTHER): Payer: Medicare Other | Admitting: Ophthalmology

## 2015-03-13 ENCOUNTER — Telehealth: Payer: Self-pay | Admitting: Dietician

## 2015-03-13 ENCOUNTER — Other Ambulatory Visit: Payer: Self-pay | Admitting: Internal Medicine

## 2015-03-13 DIAGNOSIS — E1149 Type 2 diabetes mellitus with other diabetic neurological complication: Secondary | ICD-10-CM

## 2015-03-13 NOTE — Telephone Encounter (Signed)
Called [facility] for a 6 month refill history:last refill on prandin was 06/2014.  Refill history given to Dr. Dareen Piano.

## 2015-03-13 NOTE — Telephone Encounter (Signed)
Called patient as part of project to assist patients in achieving an A1C <8%.  Congratulated [name] on her lowest A1C she has gotten in a while. She likes the prandin, takes it three times a day before meals. She has not problem getting her medicines or supplies. She cannot think of how she could lower her A1C: walks at the mall, tries to make healthy choices most of the time. reports most blood sugars are < 200 before meals. She would like help with her meter, she gets errors and it shows that she has low blood sugars. She does not report symptoms with these. She will bring her meter tomorrow and meet with diabetes educator after she sees the doctor.

## 2015-03-14 ENCOUNTER — Ambulatory Visit (HOSPITAL_COMMUNITY)
Admission: RE | Admit: 2015-03-14 | Discharge: 2015-03-14 | Disposition: A | Discharge status: Home / Self Care | Payer: Medicare Other | Source: Ambulatory Visit | Attending: Internal Medicine | Admitting: Internal Medicine

## 2015-03-14 ENCOUNTER — Ambulatory Visit (INDEPENDENT_AMBULATORY_CARE_PROVIDER_SITE_OTHER): Payer: Medicare Other | Admitting: Dietician

## 2015-03-14 ENCOUNTER — Encounter: Payer: Self-pay | Admitting: Internal Medicine

## 2015-03-14 ENCOUNTER — Ambulatory Visit (INDEPENDENT_AMBULATORY_CARE_PROVIDER_SITE_OTHER): Payer: Medicare Other | Admitting: Internal Medicine

## 2015-03-14 VITALS — BP 129/69 | HR 95 | Temp 98.4°F | Ht 63.0 in | Wt 149.1 lb

## 2015-03-14 DIAGNOSIS — Z7984 Long term (current) use of oral hypoglycemic drugs: Secondary | ICD-10-CM

## 2015-03-14 DIAGNOSIS — E1165 Type 2 diabetes mellitus with hyperglycemia: Secondary | ICD-10-CM | POA: Diagnosis not present

## 2015-03-14 DIAGNOSIS — I890 Lymphedema, not elsewhere classified: Secondary | ICD-10-CM | POA: Diagnosis not present

## 2015-03-14 DIAGNOSIS — Z713 Dietary counseling and surveillance: Secondary | ICD-10-CM | POA: Diagnosis not present

## 2015-03-14 DIAGNOSIS — E1149 Type 2 diabetes mellitus with other diabetic neurological complication: Secondary | ICD-10-CM | POA: Diagnosis not present

## 2015-03-14 DIAGNOSIS — M25552 Pain in left hip: Secondary | ICD-10-CM | POA: Diagnosis not present

## 2015-03-14 DIAGNOSIS — Z794 Long term (current) use of insulin: Secondary | ICD-10-CM

## 2015-03-14 DIAGNOSIS — E039 Hypothyroidism, unspecified: Secondary | ICD-10-CM | POA: Diagnosis not present

## 2015-03-14 DIAGNOSIS — M25641 Stiffness of right hand, not elsewhere classified: Secondary | ICD-10-CM

## 2015-03-14 DIAGNOSIS — M7989 Other specified soft tissue disorders: Secondary | ICD-10-CM | POA: Insufficient documentation

## 2015-03-14 DIAGNOSIS — E1140 Type 2 diabetes mellitus with diabetic neuropathy, unspecified: Secondary | ICD-10-CM | POA: Diagnosis not present

## 2015-03-14 DIAGNOSIS — Z09 Encounter for follow-up examination after completed treatment for conditions other than malignant neoplasm: Secondary | ICD-10-CM | POA: Diagnosis not present

## 2015-03-14 DIAGNOSIS — M189 Osteoarthritis of first carpometacarpal joint, unspecified: Secondary | ICD-10-CM | POA: Diagnosis not present

## 2015-03-14 DIAGNOSIS — M25649 Stiffness of unspecified hand, not elsewhere classified: Secondary | ICD-10-CM | POA: Insufficient documentation

## 2015-03-14 MED ORDER — INSULIN DETEMIR 100 UNIT/ML FLEXPEN: PEN_INJECTOR | SUBCUTANEOUS | Status: DC

## 2015-03-14 MED ORDER — GABAPENTIN 100 MG PO CAPS: 100.0000 mg | ORAL_CAPSULE | Freq: Every day | ORAL | Status: DC

## 2015-03-14 NOTE — Assessment & Plan Note (Signed)
-   complains of stiffness of fingers of right hand occurring intermittently throughout the day - X ray with mild degenerative changes - no fevers/joint pain/erythema/increased warmth - Does have chronic swelling in right hand secondary to lymphedema - Would consider further w/u if persists including w/u for RA but unlikely

## 2015-03-14 NOTE — Progress Notes (Signed)
   Subjective:    Patient ID: [name], female    DOB: 1950/10/06, 65 y.o.   MRN: [id number]  HPI Patient seen and examined. She presents today for routine follow up of her DM.  Of note she also complains of intermittent stiffness of her right fingers and has to straighten them out with her other hand sometimes. She also has chronic lymphedema in her RUE.   Review of Systems  Constitutional: Negative.   HENT: Negative.   Respiratory: Negative.   Cardiovascular: Negative.   Gastrointestinal: Negative.   Musculoskeletal: Negative for myalgias, back pain, arthralgias and neck pain.       Patient with intermittent stiffness in her right fingers sometimes in the morning. No assoc pain/fevers/redness/worsening swelling.  Does have chronic lymphedema in that arm  Skin: Negative.   Neurological: Negative.   Psychiatric/Behavioral: Negative.        Objective:   Physical Exam  Constitutional: She is oriented to person, place, and time. She appears well-developed and well-nourished.  HENT:  Head: Normocephalic and atraumatic.  Neck: Normal range of motion.  Cardiovascular: Normal rate, regular rhythm and normal heart sounds.   Pulmonary/Chest: Breath sounds normal. No respiratory distress. She has no wheezes.  Abdominal: Soft. Bowel sounds are normal. She exhibits no distension. There is no tenderness.  Musculoskeletal: Normal range of motion. She exhibits edema.  R UE chronic lymphedema extending to her hand and fingers, no erythema or increased local warmth or tenderness  Neurological: She is alert and oriented to person, place, and time.  Skin: Skin is warm and dry. No erythema.  Psychiatric: She has a normal mood and affect. Her behavior is normal.          Assessment & Plan:  Please see problem based charting for assessment and plan:

## 2015-03-14 NOTE — Assessment & Plan Note (Signed)
Lab Results  Component Value Date   HGBA1C 8.1 02/24/2015   HGBA1C 9.6 10/18/2014   HGBA1C 10.2 05/16/2014     Assessment: Diabetes control:  fair Progress toward A1C goal: improved   Comments: Patient has had intermittent hypoglycemic episodes as well as low as the 60s  Plan: Medications:  will d/c prandin and decrease levemir to 40 units. c/w metformin and pravastatin Home glucose monitoring: Frequency:   Timing:   Instruction/counseling given: reminded to bring blood glucose meter & log to each visit and discussed foot care Educational resources provided: other (see comments) (saw Diabetes Educator today) Self management tools provided:   Other plans: diabetic foot exam done today. Will check BMP and urine microalbumin

## 2015-03-14 NOTE — Patient Instructions (Signed)
Please make a follow up with [name] in 2 months.(April to check your weight and blood sugars and how you are doing with your mew meter)   Your weight is increased 6# from last September. What can you do to stop gaining and possible take off a few pounds?    Call [facility] and tell them you will need ONE TOUCH VERIO Strips for your new meter.   They can call me if needed for a prescription.  [name]  [phone]

## 2015-03-14 NOTE — Patient Instructions (Signed)
-   It was a pleasure seeing you today - Please stop taking the prandin - Monitor your blood sugars closely - We will decrease your levemir to 40 units - We will check your blood work and a urine test today - Please stop taking the mobic - We will check an x ray of her right hand

## 2015-03-14 NOTE — Progress Notes (Signed)
  Medical Nutrition Therapy:  Appt start time: 0920 end time: 0940   Assessment:  Primary concerns today: blood sugar control. Patient reports a lot of stress over past 3 months with eye and thumb surgeries. The antibiotics she was on decreased her appetite and she has lost weight. (10-13# in past 3 months) unintentionally. It has been difficult to check her blood sugars with thumb out of commission and she also reports having trouble getting testing supplies from [place].  WEIGHT: 149.1#, IBW: 115# -127#, BMI is 26 which is increased 6# BLOOD SUGARS: average for past 30 days 130, past 90 days 217, A1C today is 9.6, improved from 10.2, her download does show low blood sugars fasting and patient reports symptoms at this time, before dinner and bedtime, range is 59 to HIGH MEDICATIONS: takes levemir to 45 units at night, metformin twice a day  and prandin as needed and when she remembers- has half a bottle of 90 pills that were dispensed in June 2016. . Had 2 lows after prandin yesterday  SLEEP: DIABETES DISTRESS: DIETARY INTAKE: 24-hr recall was  Not done in full, patient is trying to eat as healthy as possible in an unhealhty eating environement  Recent physical activity: walks during [place] and sometimes with daughter  Estimated daily energy needs: ~ 1400-1600 calories ~200 g carbohydrates   Progress Towards Goal(s):  In progress.   Nutritional Diagnosis:  [place]-2.2 Altered nutrition-related laboratory and -2.4 Predicted food-medication interaction As related to lack of adequate blood sugar control much improved, but still not at goal 7%  As evidenced by A1C drop from 10.2 to 8.1%.    Intervention:  Nutrition education on new meter provided to patient today-Onetouch verio takes less blood and is more accurate, congratulated her on lowering blood sugars coordination of care- discussed hypoglycemia and possible solutions with her doctor Handouts given during visit  include:AVS Monitoring/Evaluation:  Dietary intake, exercise, meter, and body weight in 8 week(s).

## 2015-03-14 NOTE — Assessment & Plan Note (Signed)
-   now resolved. Left hip x ray with no acute path - Will d/c meloxicam for now and monitor

## 2015-03-14 NOTE — Assessment & Plan Note (Signed)
-   No symptoms of overt hypothyroidism - She feels well and has been losing weight intentionally - Will check repeat TSH today

## 2015-03-15 LAB — BMP8+ANION GAP
Anion Gap: 18 mmol/L (ref 10.0–18.0)
BUN/Creatinine Ratio: 21 (ref 11–26)
BUN: 20 mg/dL (ref 8–27)
CO2: 23 mmol/L (ref 18–29)
Calcium: 10.2 mg/dL (ref 8.7–10.3)
Chloride: 101 mmol/L (ref 96–106)
Creatinine, Ser: 0.97 mg/dL (ref 0.57–1.00)
GFR calc Af Amer: 71 mL/min/{1.73_m2} (ref >59)
GFR calc non Af Amer: 62 mL/min/{1.73_m2} (ref >59)
Glucose: 89 mg/dL (ref 65–99)
Potassium: 4.5 mmol/L (ref 3.5–5.2)
Sodium: 142 mmol/L (ref 134–144)

## 2015-03-15 LAB — TSH: TSH: 1.5 u[IU]/mL (ref 0.450–4.500)

## 2015-03-15 LAB — MICROALBUMIN / CREATININE URINE RATIO
Creatinine, Urine: 63.7 mg/dL
MICROALB/CREAT RATIO: 12.7 mg/g creat (ref 0.0–30.0)
Microalbumin, Urine: 8.1 ug/mL

## 2015-03-23 ENCOUNTER — Ambulatory Visit
Admission: RE | Admit: 2015-03-23 | Discharge: 2015-03-23 | Disposition: A | Discharge status: Home / Self Care | Payer: Medicare Other | Source: Ambulatory Visit | Attending: Gastroenterology | Admitting: Gastroenterology

## 2015-03-23 ENCOUNTER — Other Ambulatory Visit: Payer: Self-pay | Admitting: Gastroenterology

## 2015-03-23 DIAGNOSIS — Z1211 Encounter for screening for malignant neoplasm of colon: Secondary | ICD-10-CM | POA: Diagnosis not present

## 2015-03-23 DIAGNOSIS — Z86010 Personal history of colonic polyps: Secondary | ICD-10-CM | POA: Diagnosis not present

## 2015-03-23 DIAGNOSIS — K5730 Diverticulosis of large intestine without perforation or abscess without bleeding: Secondary | ICD-10-CM | POA: Diagnosis not present

## 2015-03-23 DIAGNOSIS — Q438 Other specified congenital malformations of intestine: Secondary | ICD-10-CM

## 2015-03-30 LAB — HM DIABETES EYE EXAM

## 2015-03-31 ENCOUNTER — Encounter (INDEPENDENT_AMBULATORY_CARE_PROVIDER_SITE_OTHER): Payer: Medicare Other | Admitting: Ophthalmology

## 2015-03-31 DIAGNOSIS — H43813 Vitreous degeneration, bilateral: Secondary | ICD-10-CM

## 2015-03-31 DIAGNOSIS — E113312 Type 2 diabetes mellitus with moderate nonproliferative diabetic retinopathy with macular edema, left eye: Secondary | ICD-10-CM | POA: Diagnosis not present

## 2015-03-31 DIAGNOSIS — E11311 Type 2 diabetes mellitus with unspecified diabetic retinopathy with macular edema: Secondary | ICD-10-CM | POA: Diagnosis not present

## 2015-03-31 DIAGNOSIS — H35033 Hypertensive retinopathy, bilateral: Secondary | ICD-10-CM | POA: Diagnosis not present

## 2015-03-31 DIAGNOSIS — I10 Essential (primary) hypertension: Secondary | ICD-10-CM

## 2015-03-31 DIAGNOSIS — E113391 Type 2 diabetes mellitus with moderate nonproliferative diabetic retinopathy without macular edema, right eye: Secondary | ICD-10-CM

## 2015-04-06 ENCOUNTER — Ambulatory Visit: Payer: Medicare Other | Admitting: Podiatry

## 2015-04-15 ENCOUNTER — Other Ambulatory Visit: Payer: Self-pay | Admitting: Internal Medicine

## 2015-04-17 ENCOUNTER — Other Ambulatory Visit: Payer: Self-pay | Admitting: Internal Medicine

## 2015-04-20 ENCOUNTER — Ambulatory Visit (INDEPENDENT_AMBULATORY_CARE_PROVIDER_SITE_OTHER): Payer: Medicare Other | Admitting: Podiatry

## 2015-04-20 ENCOUNTER — Encounter: Payer: Self-pay | Admitting: Podiatry

## 2015-04-20 DIAGNOSIS — M79676 Pain in unspecified toe(s): Secondary | ICD-10-CM | POA: Diagnosis not present

## 2015-04-20 DIAGNOSIS — E1149 Type 2 diabetes mellitus with other diabetic neurological complication: Secondary | ICD-10-CM | POA: Diagnosis not present

## 2015-04-20 DIAGNOSIS — B351 Tinea unguium: Secondary | ICD-10-CM | POA: Diagnosis not present

## 2015-04-20 NOTE — Progress Notes (Signed)
Patient ID: [name], female   DOB: [birth year], 65 y.o.   MRN: [id number] Complaint:  Visit Type: Patient returns to my office for continued preventative foot care services. Complaint: Patient states" my nails have grown long and thick and become painful to walk and wear shoes" Patient has been diagnosed with DM with no foot complications. The patient presents for preventative foot care services. No changes to ROS  Podiatric Exam: Vascular: dorsalis pedis and posterior tibial pulses are palpable bilateral. Capillary return is immediate. Temperature gradient is WNL. Skin turgor WNL  Sensorium: Normal Semmes Weinstein monofilament test. Normal tactile sensation bilaterally. Nail Exam: Pt has thick disfigured discolored nails with subungual debris noted bilateral entire nail hallux through fifth toenails Ulcer Exam: There is no evidence of ulcer or pre-ulcerative changes or infection. Orthopedic Exam: Muscle tone and strength are WNL. No limitations in general ROM. No crepitus or effusions noted. Foot type and digits show no abnormalities. Bony prominences are unremarkable. Skin: No Porokeratosis. No infection or ulcers.  Heloma durum fifth toes B/L s/p surgery by Little Ishikawa  Diagnosis:  Onychomycosis, , Pain in right toe, pain in left toes  Treatment & Plan Procedures and Treatment: Consent by patient was obtained for treatment procedures. The patient understood the discussion of treatment and procedures well. All questions were answered thoroughly reviewed. Debridement of mycotic and hypertrophic toenails, 1 through 5 bilateral and clearing of subungual debris. No ulceration, no infection noted.  Return Visit-Office Procedure: Patient instructed to return to the office for a follow up visit 3 months for continued evaluation and treatment.  [name] DPM

## 2015-04-24 ENCOUNTER — Telehealth: Payer: Self-pay | Admitting: Internal Medicine

## 2015-04-24 NOTE — Telephone Encounter (Signed)
APPT. REMINDER CALL, LMTCB °

## 2015-04-25 ENCOUNTER — Ambulatory Visit: Payer: Medicare Other | Admitting: Internal Medicine

## 2015-04-25 ENCOUNTER — Encounter: Payer: Self-pay | Admitting: Internal Medicine

## 2015-04-25 ENCOUNTER — Encounter: Payer: Medicare Other | Admitting: Dietician

## 2015-04-28 ENCOUNTER — Encounter (INDEPENDENT_AMBULATORY_CARE_PROVIDER_SITE_OTHER): Payer: Medicare Other | Admitting: Ophthalmology

## 2015-04-28 ENCOUNTER — Other Ambulatory Visit: Payer: Self-pay | Admitting: Internal Medicine

## 2015-04-28 DIAGNOSIS — I10 Essential (primary) hypertension: Secondary | ICD-10-CM | POA: Diagnosis not present

## 2015-04-28 DIAGNOSIS — E113313 Type 2 diabetes mellitus with moderate nonproliferative diabetic retinopathy with macular edema, bilateral: Secondary | ICD-10-CM

## 2015-04-28 DIAGNOSIS — H43813 Vitreous degeneration, bilateral: Secondary | ICD-10-CM | POA: Diagnosis not present

## 2015-04-28 DIAGNOSIS — E11311 Type 2 diabetes mellitus with unspecified diabetic retinopathy with macular edema: Secondary | ICD-10-CM | POA: Diagnosis not present

## 2015-04-28 DIAGNOSIS — H35033 Hypertensive retinopathy, bilateral: Secondary | ICD-10-CM | POA: Diagnosis not present

## 2015-04-28 MED ORDER — AMLODIPINE BESYLATE 10 MG PO TABS: 10.0000 mg | ORAL_TABLET | Freq: Every day | ORAL | Status: DC

## 2015-04-28 NOTE — Telephone Encounter (Signed)
Requesting BP med to be filled @ [facility] on [address].

## 2015-04-28 NOTE — Telephone Encounter (Signed)
Last appt 03/14/15.

## 2015-05-01 DIAGNOSIS — H18413 Arcus senilis, bilateral: Secondary | ICD-10-CM | POA: Diagnosis not present

## 2015-05-01 DIAGNOSIS — H26491 Other secondary cataract, right eye: Secondary | ICD-10-CM | POA: Diagnosis not present

## 2015-05-01 DIAGNOSIS — Z7984 Long term (current) use of oral hypoglycemic drugs: Secondary | ICD-10-CM | POA: Diagnosis not present

## 2015-05-01 DIAGNOSIS — H04123 Dry eye syndrome of bilateral lacrimal glands: Secondary | ICD-10-CM | POA: Diagnosis not present

## 2015-05-01 DIAGNOSIS — H401131 Primary open-angle glaucoma, bilateral, mild stage: Secondary | ICD-10-CM | POA: Diagnosis not present

## 2015-05-01 DIAGNOSIS — Z9842 Cataract extraction status, left eye: Secondary | ICD-10-CM | POA: Diagnosis not present

## 2015-05-01 DIAGNOSIS — Z961 Presence of intraocular lens: Secondary | ICD-10-CM | POA: Diagnosis not present

## 2015-05-01 DIAGNOSIS — H11153 Pinguecula, bilateral: Secondary | ICD-10-CM | POA: Diagnosis not present

## 2015-05-01 DIAGNOSIS — E119 Type 2 diabetes mellitus without complications: Secondary | ICD-10-CM | POA: Diagnosis not present

## 2015-05-01 DIAGNOSIS — H11423 Conjunctival edema, bilateral: Secondary | ICD-10-CM | POA: Diagnosis not present

## 2015-05-09 ENCOUNTER — Other Ambulatory Visit: Payer: Self-pay | Admitting: Internal Medicine

## 2015-05-26 ENCOUNTER — Other Ambulatory Visit: Payer: Self-pay | Admitting: Internal Medicine

## 2015-06-09 ENCOUNTER — Encounter (INDEPENDENT_AMBULATORY_CARE_PROVIDER_SITE_OTHER): Payer: Medicare Other | Admitting: Ophthalmology

## 2015-06-09 DIAGNOSIS — H35033 Hypertensive retinopathy, bilateral: Secondary | ICD-10-CM | POA: Diagnosis not present

## 2015-06-09 DIAGNOSIS — E11311 Type 2 diabetes mellitus with unspecified diabetic retinopathy with macular edema: Secondary | ICD-10-CM | POA: Diagnosis not present

## 2015-06-09 DIAGNOSIS — H43813 Vitreous degeneration, bilateral: Secondary | ICD-10-CM

## 2015-06-09 DIAGNOSIS — E113391 Type 2 diabetes mellitus with moderate nonproliferative diabetic retinopathy without macular edema, right eye: Secondary | ICD-10-CM | POA: Diagnosis not present

## 2015-06-09 DIAGNOSIS — I10 Essential (primary) hypertension: Secondary | ICD-10-CM | POA: Diagnosis not present

## 2015-06-09 DIAGNOSIS — E113312 Type 2 diabetes mellitus with moderate nonproliferative diabetic retinopathy with macular edema, left eye: Secondary | ICD-10-CM | POA: Diagnosis not present

## 2015-06-12 ENCOUNTER — Telehealth: Payer: Self-pay | Admitting: Internal Medicine

## 2015-06-12 ENCOUNTER — Encounter: Payer: Self-pay | Admitting: *Deleted

## 2015-06-12 NOTE — Telephone Encounter (Signed)
PT. REMINDER CALL, LMTCB °

## 2015-06-13 ENCOUNTER — Ambulatory Visit (INDEPENDENT_AMBULATORY_CARE_PROVIDER_SITE_OTHER): Payer: Medicare Other | Admitting: Internal Medicine

## 2015-06-13 ENCOUNTER — Encounter: Payer: Self-pay | Admitting: Internal Medicine

## 2015-06-13 ENCOUNTER — Other Ambulatory Visit: Payer: Self-pay | Admitting: Dietician

## 2015-06-13 ENCOUNTER — Ambulatory Visit (INDEPENDENT_AMBULATORY_CARE_PROVIDER_SITE_OTHER): Payer: Medicare Other | Admitting: Dietician

## 2015-06-13 VITALS — BP 134/73 | HR 90 | Temp 98.2°F | Ht 63.0 in | Wt 147.1 lb

## 2015-06-13 DIAGNOSIS — F32A Depression, unspecified: Secondary | ICD-10-CM

## 2015-06-13 DIAGNOSIS — E039 Hypothyroidism, unspecified: Secondary | ICD-10-CM

## 2015-06-13 DIAGNOSIS — Z6826 Body mass index (BMI) 26.0-26.9, adult: Secondary | ICD-10-CM

## 2015-06-13 DIAGNOSIS — Z713 Dietary counseling and surveillance: Secondary | ICD-10-CM

## 2015-06-13 DIAGNOSIS — Z794 Long term (current) use of insulin: Secondary | ICD-10-CM | POA: Diagnosis not present

## 2015-06-13 DIAGNOSIS — E1149 Type 2 diabetes mellitus with other diabetic neurological complication: Secondary | ICD-10-CM

## 2015-06-13 DIAGNOSIS — I10 Essential (primary) hypertension: Secondary | ICD-10-CM | POA: Diagnosis not present

## 2015-06-13 DIAGNOSIS — E119 Type 2 diabetes mellitus without complications: Secondary | ICD-10-CM

## 2015-06-13 DIAGNOSIS — Z0000 Encounter for general adult medical examination without abnormal findings: Secondary | ICD-10-CM

## 2015-06-13 DIAGNOSIS — Z79899 Other long term (current) drug therapy: Secondary | ICD-10-CM

## 2015-06-13 DIAGNOSIS — M25641 Stiffness of right hand, not elsewhere classified: Secondary | ICD-10-CM

## 2015-06-13 DIAGNOSIS — F329 Major depressive disorder, single episode, unspecified: Secondary | ICD-10-CM

## 2015-06-13 LAB — GLUCOSE, CAPILLARY: Glucose-Capillary: 314 mg/dL — ABNORMAL HIGH (ref 65–99)

## 2015-06-13 LAB — POCT GLYCOSYLATED HEMOGLOBIN (HGB A1C): Hemoglobin A1C: 8.7

## 2015-06-13 MED ORDER — LIRAGLUTIDE 18 MG/3ML ~~LOC~~ SOPN: 0.6000 mg | PEN_INJECTOR | Freq: Every day | SUBCUTANEOUS | Status: DC

## 2015-06-13 MED ORDER — GLUCOSE BLOOD VI STRP: ORAL_STRIP | Status: DC

## 2015-06-13 MED ORDER — VENLAFAXINE HCL ER 150 MG PO CP24: 150.0000 mg | ORAL_CAPSULE | Freq: Every day | ORAL | Status: DC

## 2015-06-13 MED ORDER — INSULIN DETEMIR 100 UNIT/ML FLEXPEN: PEN_INJECTOR | SUBCUTANEOUS | Status: DC

## 2015-06-13 NOTE — Patient Instructions (Signed)
-   It was a pleasure seeing you today - We will change your levemir to 30 units at night because of your low blood sugars - We will add victoza to your regimen- inject once daily - I have increased your effexor to 150 mg - Please follow up in 3 months - I have referred you to sports medicine

## 2015-06-13 NOTE — Assessment & Plan Note (Signed)
BP Readings from Last 3 Encounters:  06/13/15 134/73  03/14/15 129/69  02/24/15 134/74    Lab Results  Component Value Date   NA 142 03/14/2015   K 4.5 03/14/2015   CREATININE 0.97 03/14/2015    Assessment: Blood pressure control: controlled Progress toward BP goal:    at goal Comments: compliant with HCTZ and amlodipine  Plan: Medications:  continue current medications Educational resources provided: brochure Self management tools provided:   Other plans:

## 2015-06-13 NOTE — Patient Instructions (Signed)
Please call for a follow up in 2-3 weeks and to tell us how you are doing on the Victoza.   You should be able to get One Touch Verio strips at [facility] this week.   Keep up the great work taking care of you!  Call anytime!   [name]   [phone]

## 2015-06-13 NOTE — Assessment & Plan Note (Signed)
-   Will need to refer to GI for repeat colonoscopy on next visit - given tubulovillous adenoma in 2014 - Wants to defer PNA vaccine till next visit

## 2015-06-13 NOTE — Assessment & Plan Note (Signed)
-   PHQ 9 score has improved - Patient still complains of intermittent episodes of worsening depression - Will increase effexor to 150 mg and monitor

## 2015-06-13 NOTE — Assessment & Plan Note (Signed)
Lab Results  Component Value Date   HGBA1C 8.7 06/13/2015   HGBA1C 8.1 02/24/2015   HGBA1C 9.6 10/18/2014     Assessment: Diabetes control:  fair Progress toward A1C goal: deteriorated   Comments: compliant with meds. Has lows to 70s and highs to 300s  Plan: Medications:  will reduce levemir to 30 units and start victoza 0.6 mg. c/w metformin Home glucose monitoring: Frequency:   Timing:   Instruction/counseling given: reminded to bring blood glucose meter & log to each visit, discussed the need for weight loss and discussed diet Educational resources provided: brochure, other (see comments) (Wants to see Diabetes Educator) Self management tools provided: copy of home glucose meter download Other plans: Patient to meet with diabetic educator today

## 2015-06-13 NOTE — Assessment & Plan Note (Signed)
-   Repeat TSH done on last visit was wnl - Will c/w synthroid at 75 micrograms

## 2015-06-13 NOTE — Progress Notes (Signed)
   Subjective:    Patient ID: [name], female    DOB: Mar 04, 1950, 65 y.o.   MRN: [id number]  HPI Patient seen and examined. She is here for routine follow up of her HTN and DM.  She complains of stiffness in the fingers of her right hand and inability to straighten them out. X ray done in feb 2017 with only mild degenerative changes. She also has mild swelling in R UE which is secondary to lymphedema from R mastectomy. States the swelling has continued to improve.  She notices that her blood sugars go low to the 60s and also can shoot up to the 300s. She wants to visit with [name] to talk about diet.  She continues to exercise and is attempting to lose weight. Has lost approx 10 lbs over the course of a year   Review of Systems  Constitutional: Negative.   HENT: Negative.   Respiratory: Negative.   Cardiovascular: Negative.   Gastrointestinal: Negative.   Musculoskeletal: Positive for arthralgias. Negative for myalgias, back pain, joint swelling, gait problem and neck pain.  Neurological: Negative.   Psychiatric/Behavioral: Negative.        Objective:   Physical Exam  Constitutional: She is oriented to person, place, and time. She appears well-developed and well-nourished.  HENT:  Head: Normocephalic and atraumatic.  Cardiovascular: Normal rate, regular rhythm and normal heart sounds.   Pulmonary/Chest: Effort normal and breath sounds normal. No respiratory distress. She has no wheezes.  Abdominal: Soft. Bowel sounds are normal. She exhibits no distension. There is no tenderness.  Musculoskeletal: Normal range of motion. She exhibits no edema.  Neurological: She is alert and oriented to person, place, and time.  Skin: Skin is warm. No rash noted. No erythema.  Psychiatric: She has a normal mood and affect. Her behavior is normal.          Assessment & Plan:  Please see problem based charting for assessment and plan:

## 2015-06-13 NOTE — Telephone Encounter (Signed)
Patient would like to use her onetouch verio meter, but [facility] cannot supply the strips for it. Confirmed this with them today.  [facility] needs a prescription to dispense them. Request a prescription be sent for these strips

## 2015-06-13 NOTE — Assessment & Plan Note (Signed)
-   Patient with worsening ability straighten out her right fingers - Does have mild swelling in R hand from chronic lymphedema - X rays with no acute abnormalities - Will refer to sports medicine for further work up

## 2015-06-13 NOTE — Progress Notes (Signed)
  Medical Nutrition Therapy:  Appt start time: 0930 end time: 0950   Assessment:  Primary concerns today: blood sugar control. Patient reports having trouble getting testing supplies from [place]. Wants to use her verio meter. She has had diabetes for > 20 years - sees dentist yearly, checks feet daily and sees podiatrist, sees eye doctor regularly. She asks for a dental referral 1990s and is starting victoza today. She had questions which were answered.  She has been gardening and walking pretty regularly.   WEIGHT: 147#, IBW: 115# -127#, BMI-26 BLOOD SUGARS: A1C 8.7, she'd like her a1c to be lower. Her meter dates were off so hard to interpret meter download. It appears she is having many < 80 on 40 units levemir ./day MEDICATIONS: takes levemir to 40 units at night, metformin twice daily SLEEP: DIABETES DISTRESS: low today DIETARY INTAKE: 24-hr recall- patient is trying to eat as healthy as possible in an unhealhty eating environement  Estimated daily energy needs: ~ 1400-1600 calories ~200 g carbohydrates   Progress Towards Goal(s):  In progress.   Nutritional Diagnosis:  [place]-2.2 Altered nutrition-related laboratory and [place]-2.4 Predicted food-medication interaction As related to lack of adequate blood sugar control worsened , still not at goal 7%  As evidenced by A1C drop from 8.1 to 8.7%.    Intervention:  Nutrition education on strips, GLP-1s,  meter download.  Handouts given during visit include:AVS Monitoring/Evaluation:  Dietary intake, exercise, meter, and body weight in 3 week(s).

## 2015-06-22 ENCOUNTER — Ambulatory Visit (INDEPENDENT_AMBULATORY_CARE_PROVIDER_SITE_OTHER): Payer: Medicare Other | Admitting: Family Medicine

## 2015-06-22 ENCOUNTER — Encounter: Payer: Self-pay | Admitting: Family Medicine

## 2015-06-22 VITALS — BP 140/70 | Ht 63.0 in | Wt 148.0 lb

## 2015-06-22 DIAGNOSIS — M6530 Trigger finger, unspecified finger: Secondary | ICD-10-CM

## 2015-06-22 NOTE — Patient Instructions (Signed)
Dr Bertis Ruddy office will contact you with an appt time and date [address], [place], [place] [address] Phone: [phone]

## 2015-06-22 NOTE — Assessment & Plan Note (Addendum)
Most likely multiple trigger fingers, the third extremely severe. However she may have underlying connective tissue versus rheumatologic disease which has caused this as well. Does not appear to be Dupentryer's contracture.   - Offered injection to A1 pulley but pt does not want to try this today.  Will refer to hand surgery (Dr. Burney Gauze) for further evaluation and possible A1 pulley release vs further imaging or tx.

## 2015-06-22 NOTE — Progress Notes (Signed)
  [name] - 65 y.o. female MRN [id number]  Date of birth: 12-29-1950 [name] is a 65 y.o. female who presents today for R hand pain.  R hand pain, initial visit 06/22/15 - patient presents today with ongoing inability to move her second third and fourth digits on her right hand. She is right hand dominant denies previous injury. She is previously had a ganglion cyst removal by Dr. Burney Gauze performed in 2016.  She also has a history of breast cancer with mastectomy with lymphedema on the right side. She has had this inability to move her hand and fingers for the past 6 months to a year now. No previous treatment and she denies any paresthesias going into her fingers.   PMHx - Updated and reviewed.  Contributory factors include: Chronic kidney disease hypertension hyperlipidemia PSHx - Updated and reviewed.  Contributory factors include:  Mastectomy right FHx - Updated and reviewed.  Contributory factors include:  Diabetes father Social Hx - Updated and reviewed. Contributory factors include: Nonsmoker  Medications - reviewed   ROS Per HPI.  12 point negative other than per HPI.   Exam:  Filed Vitals:   06/22/15 1007  BP: 140/70   Gen: NAD, AAO 3 Cardio- RRR Pulm - Normal respiratory effort/rate Skin: No rashes or erythema Extremities: No edema  Vascular: pulses +2 bilateral upper and lower extremity Psych: Normal affect  MSK: R hand - 3rd finger in flexion contracture with 4th and 2nd partially.  No TTP at MCP joint or MC head palmar aspect.  Palpable nodule at 3rd/4th 5th.  ROM restricted in A/P ROM at MCP/PIP/DIP of 3rd digit with extension on R.  Neurovascular intact RUE.     Imaging:  2 view R hand reviewed - CMC OA at 1st digit with minimal sclerosis at radiocarpal joint

## 2015-07-04 ENCOUNTER — Other Ambulatory Visit: Payer: Self-pay | Admitting: Internal Medicine

## 2015-07-06 DIAGNOSIS — R52 Pain, unspecified: Secondary | ICD-10-CM | POA: Insufficient documentation

## 2015-07-06 DIAGNOSIS — M1811 Unilateral primary osteoarthritis of first carpometacarpal joint, right hand: Secondary | ICD-10-CM | POA: Insufficient documentation

## 2015-07-06 DIAGNOSIS — M24541 Contracture, right hand: Secondary | ICD-10-CM | POA: Diagnosis not present

## 2015-07-06 DIAGNOSIS — G5601 Carpal tunnel syndrome, right upper limb: Secondary | ICD-10-CM | POA: Insufficient documentation

## 2015-07-06 DIAGNOSIS — M65321 Trigger finger, right index finger: Secondary | ICD-10-CM | POA: Diagnosis not present

## 2015-07-06 DIAGNOSIS — M6530 Trigger finger, unspecified finger: Secondary | ICD-10-CM | POA: Insufficient documentation

## 2015-07-06 DIAGNOSIS — M65341 Trigger finger, right ring finger: Secondary | ICD-10-CM | POA: Diagnosis not present

## 2015-07-06 DIAGNOSIS — M65331 Trigger finger, right middle finger: Secondary | ICD-10-CM | POA: Diagnosis not present

## 2015-07-06 DIAGNOSIS — M65351 Trigger finger, right little finger: Secondary | ICD-10-CM | POA: Diagnosis not present

## 2015-07-20 ENCOUNTER — Encounter: Payer: Self-pay | Admitting: Podiatry

## 2015-07-20 ENCOUNTER — Ambulatory Visit (INDEPENDENT_AMBULATORY_CARE_PROVIDER_SITE_OTHER): Payer: Medicare Other | Admitting: Podiatry

## 2015-07-20 DIAGNOSIS — B351 Tinea unguium: Secondary | ICD-10-CM | POA: Diagnosis not present

## 2015-07-20 DIAGNOSIS — E1149 Type 2 diabetes mellitus with other diabetic neurological complication: Secondary | ICD-10-CM | POA: Diagnosis not present

## 2015-07-20 DIAGNOSIS — M79676 Pain in unspecified toe(s): Secondary | ICD-10-CM

## 2015-07-20 NOTE — Progress Notes (Signed)
Patient ID: [name], female   DOB: November 25, 1950, 65 y.o.   MRN: [id number] Complaint:  Visit Type: Patient returns to my office for continued preventative foot care services. Complaint: Patient states" my nails have grown long and thick and become painful to walk and wear shoes" Patient has been diagnosed with DM with no foot complications. The patient presents for preventative foot care services. No changes to ROS  Podiatric Exam: Vascular: dorsalis pedis and posterior tibial pulses are palpable bilateral. Capillary return is immediate. Temperature gradient is WNL. Skin turgor WNL  Sensorium: Normal Semmes Weinstein monofilament test. Normal tactile sensation bilaterally. Nail Exam: Pt has thick disfigured discolored nails with subungual debris noted bilateral entire nail hallux through fifth toenails Ulcer Exam: There is no evidence of ulcer or pre-ulcerative changes or infection. Orthopedic Exam: Muscle tone and strength are WNL. No limitations in general ROM. No crepitus or effusions noted. Foot type and digits show no abnormalities. Bony prominences are unremarkable. Skin: No Porokeratosis. No infection or ulcers.  [place] durum fifth toes B/L s/p surgery by [facility].  Asymptomatic porokeratosis sub 1 left foot.  Diagnosis:  Onychomycosis, , Pain in right toe, pain in left toes  Treatment & Plan Procedures and Treatment: Consent by patient was obtained for treatment procedures. The patient understood the discussion of treatment and procedures well. All questions were answered thoroughly reviewed. Debridement of mycotic and hypertrophic toenails, 1 through 5 bilateral and clearing of subungual debris. No ulceration, no infection noted.  Return Visit-Office Procedure: Patient instructed to return to the office for a follow up visit 3 months for continued evaluation and treatment.  [name] DPM

## 2015-07-24 DIAGNOSIS — M65331 Trigger finger, right middle finger: Secondary | ICD-10-CM | POA: Diagnosis not present

## 2015-07-24 DIAGNOSIS — M65351 Trigger finger, right little finger: Secondary | ICD-10-CM | POA: Diagnosis not present

## 2015-07-24 DIAGNOSIS — M1811 Unilateral primary osteoarthritis of first carpometacarpal joint, right hand: Secondary | ICD-10-CM | POA: Diagnosis not present

## 2015-07-24 DIAGNOSIS — M65341 Trigger finger, right ring finger: Secondary | ICD-10-CM | POA: Diagnosis not present

## 2015-07-24 DIAGNOSIS — M65321 Trigger finger, right index finger: Secondary | ICD-10-CM | POA: Diagnosis not present

## 2015-07-24 DIAGNOSIS — M189 Osteoarthritis of first carpometacarpal joint, unspecified: Secondary | ICD-10-CM | POA: Diagnosis not present

## 2015-07-24 DIAGNOSIS — G5601 Carpal tunnel syndrome, right upper limb: Secondary | ICD-10-CM | POA: Diagnosis not present

## 2015-07-27 DIAGNOSIS — G5601 Carpal tunnel syndrome, right upper limb: Secondary | ICD-10-CM | POA: Diagnosis not present

## 2015-07-28 ENCOUNTER — Encounter (INDEPENDENT_AMBULATORY_CARE_PROVIDER_SITE_OTHER): Payer: Medicare Other | Admitting: Ophthalmology

## 2015-07-28 DIAGNOSIS — H35033 Hypertensive retinopathy, bilateral: Secondary | ICD-10-CM

## 2015-07-28 DIAGNOSIS — E11311 Type 2 diabetes mellitus with unspecified diabetic retinopathy with macular edema: Secondary | ICD-10-CM

## 2015-07-28 DIAGNOSIS — E113312 Type 2 diabetes mellitus with moderate nonproliferative diabetic retinopathy with macular edema, left eye: Secondary | ICD-10-CM | POA: Diagnosis not present

## 2015-07-28 DIAGNOSIS — I10 Essential (primary) hypertension: Secondary | ICD-10-CM | POA: Diagnosis not present

## 2015-07-28 DIAGNOSIS — H43813 Vitreous degeneration, bilateral: Secondary | ICD-10-CM | POA: Diagnosis not present

## 2015-07-28 DIAGNOSIS — E113391 Type 2 diabetes mellitus with moderate nonproliferative diabetic retinopathy without macular edema, right eye: Secondary | ICD-10-CM

## 2015-08-03 ENCOUNTER — Other Ambulatory Visit: Payer: Self-pay | Admitting: Internal Medicine

## 2015-08-05 ENCOUNTER — Other Ambulatory Visit: Payer: Self-pay | Admitting: Internal Medicine

## 2015-08-08 ENCOUNTER — Other Ambulatory Visit: Payer: Self-pay | Admitting: Oncology

## 2015-08-08 ENCOUNTER — Other Ambulatory Visit: Payer: Self-pay | Admitting: Internal Medicine

## 2015-08-08 DIAGNOSIS — Z1231 Encounter for screening mammogram for malignant neoplasm of breast: Secondary | ICD-10-CM

## 2015-08-14 ENCOUNTER — Ambulatory Visit
Admission: RE | Admit: 2015-08-14 | Discharge: 2015-08-14 | Disposition: A | Discharge status: Home / Self Care | Payer: Medicare Other | Source: Ambulatory Visit | Attending: Internal Medicine | Admitting: Internal Medicine

## 2015-08-14 ENCOUNTER — Ambulatory Visit (HOSPITAL_BASED_OUTPATIENT_CLINIC_OR_DEPARTMENT_OTHER): Payer: Medicare Other | Admitting: Hematology and Oncology

## 2015-08-14 ENCOUNTER — Telehealth: Payer: Self-pay | Admitting: Hematology and Oncology

## 2015-08-14 ENCOUNTER — Encounter: Payer: Self-pay | Admitting: Hematology and Oncology

## 2015-08-14 VITALS — BP 138/67 | HR 94 | Temp 98.1°F | Resp 18 | Ht 63.0 in | Wt 146.6 lb

## 2015-08-14 DIAGNOSIS — Z853 Personal history of malignant neoplasm of breast: Secondary | ICD-10-CM

## 2015-08-14 DIAGNOSIS — Z1231 Encounter for screening mammogram for malignant neoplasm of breast: Secondary | ICD-10-CM

## 2015-08-14 DIAGNOSIS — Z803 Family history of malignant neoplasm of breast: Secondary | ICD-10-CM | POA: Diagnosis not present

## 2015-08-14 NOTE — Telephone Encounter (Signed)
Gave pt cal & avs °

## 2015-08-14 NOTE — Assessment & Plan Note (Addendum)
She was diagnosed more than 15 years ago. Her records were not available, but from what the patient recall, she had a estrogen/progesterone receptor negative breast cancer. Clinically, she has no evidence of recurrence. When she was diagnosed, she was very young. She recalls seeing a Dietitian and tested negative for hereditary breast/ovary cancer mutations but her daughter was recently diagnosed with cancer and she would need to return appointment to meet with them. I gave her a prescription for mastectomy bra I will transition her care to long-term cancer survivorship clinic

## 2015-08-14 NOTE — Progress Notes (Signed)
[facility] OFFICE PROGRESS NOTE  Patient Care Team: Aldine Contes, MD as PCP - General (Internal Medicine) Hayden Pedro, MD as Attending Physician (Ophthalmology) [name], DPM as Consulting Physician (Podiatry)  SUMMARY OF ONCOLOGIC HISTORY:  I reviewed the patient's records extensive and collaborated the history with the patient. Summary of her history is as follows: This patient was diagnosed with breast cancer of the incidental finding of a lump on her chest when she presented to her doctor with coughing. I do not have all the available records. She was initially diagnosed with a Stage III B lymph node positive cancer of the right breast in April of 1999, treated with induction chemotherapy followed by a right mastectomy then radiation and additional chemotherapy. She achieved a durable response with no evidence for recurrence. Due to her young age, she had genetic counselor and was tested negative for BRCA mutation. She never received adjuvant tamoxifen because she recalled that he was issued a receptor negative. The patient has 4 children and her first child was born at age of 63. She never breast-feed any of her children. She did not recall taking hormone replacement or birth control pill. She had a partial hysterectomy at a young age due to heavy menstruation. She is up-to-date with all her screening programs  INTERVAL HISTORY: Please see below for problem oriented charting. She is due for mammogram today. She denies any recent abnormal breast examination, palpable mass, abnormal breast appearance or nipple changes She had recent carpal tunnel surgery and appears to be healing well Denies recent infection  REVIEW OF SYSTEMS:   Constitutional: Denies fevers, chills or abnormal weight loss Eyes: Denies blurriness of vision Ears, nose, mouth, throat, and face: Denies mucositis or sore throat Respiratory: Denies cough, dyspnea or wheezes Cardiovascular:  Denies palpitation, chest discomfort or lower extremity swelling Gastrointestinal:  Denies nausea, heartburn or change in bowel habits Skin: Denies abnormal skin rashes Lymphatics: Denies new lymphadenopathy or easy bruising Neurological:Denies numbness, tingling or new weaknesses Behavioral/Psych: Mood is stable, no new changes  All other systems were reviewed with the patient and are negative.  I have reviewed the past medical history, past surgical history, social history and family history with the patient and they are unchanged from previous note.  ALLERGIES:  is allergic to gabapentin; penicillins; and sulfonamide derivatives.  MEDICATIONS:  Current Outpatient Prescriptions  Medication Sig Dispense Refill  . amLODipine (NORVASC) 10 MG tablet Take 1 tablet (10 mg total) by mouth daily. 30 tablet 11  . aspirin EC 81 MG tablet Take 81 mg by mouth at bedtime.    . Cholecalciferol (VITAMIN D-3) 1000 UNITS CAPS Take 2,000 Units by mouth daily.     . clobetasol cream (TEMOVATE) 4.08 % Apply 1 application topically 2 (two) times daily as needed (rash).     . cycloSPORINE (RESTASIS) 0.05 % ophthalmic emulsion Place 1 drop into both eyes 2 (two) times daily.    [name] gabapentin (NEURONTIN) 100 MG capsule Take 1 capsule (100 mg total) by mouth at bedtime. 90 capsule 3  . glucose 4 GM chewable tablet Chew 4 tablets (16 g total) by mouth as needed for low blood sugar. 50 tablet 12  . glucose blood test strip Use one touch verio strips  2 times  daily to check blood sugar. diag code E11.40. Insulin dependent 200 each 3  . hydrochlorothiazide (MICROZIDE) 12.5 MG capsule TAKE 1 CAPSULE BY MOUTH EVERY DAY 90 capsule 3  . Insulin Detemir (LEVEMIR FLEXTOUCH) 100  UNIT/ML Pen INJECT 30 UNITS SUBCUTANEOUSLY AT BEDTIME 15 mL 6  . Insulin Pen Needle (BD PEN NEEDLE NANO U/F) 32G X 4 MM MISC Use as directed 100 each 3  . Insulin Syringe-Needle U-100 (B-D INS SYR ULTRAFINE .5CC/30G) 30G X 1/2" 0.5 ML MISC To use 4  times daily for injecting insulin. diag code 250.42. Insulin dependent 150 each 6  . Lancets (ONETOUCH ULTRASOFT) lancets Use to check blood sugar 2 time daily. diag code E11.40. Insulin dependent 180 each 3  . levothyroxine (SYNTHROID, LEVOTHROID) 75 MCG tablet TAKE 1 TABLET BY MOUTH DAILY BEFORE BREAKFAST 90 tablet 3  . Liraglutide 18 MG/3ML SOPN Inject 0.1 mLs (0.6 mg total) into the skin daily. 6 mL 3  . metFORMIN (GLUCOPHAGE) 1000 MG tablet TAKE 1 TABLET BY MOUTH TWICE DAILY WITH MEALS 60 tablet 6  . Polyvinyl Alcohol-Povidone (REFRESH OP) Place 1 drop into both eyes 3 (three) times daily.    . pravastatin (PRAVACHOL) 40 MG tablet TAKE 1 TABLET BY MOUTH DAILY 90 tablet 3  . Q-NOL 325 MG tablet TAKE 1 TABLET(325 MG) BY MOUTH EVERY 8 HOURS AS NEEDED FOR PAIN 90 tablet 0  . venlafaxine XR (EFFEXOR XR) 150 MG 24 hr capsule Take 1 capsule (150 mg total) by mouth daily with breakfast. 30 capsule 2  . zoster vaccine live, PF, (ZOSTAVAX) 17915 UNT/0.65ML injection Inject 19,400 Units into the skin once. 1 each 0   No current facility-administered medications for this visit.    PHYSICAL EXAMINATION: ECOG PERFORMANCE STATUS: 0 - Asymptomatic  Filed Vitals:   08/14/15 1109  BP: 138/67  Pulse: 94  Temp: 98.1 F (36.7 C)  Resp: 18   Filed Weights   08/14/15 1109  Weight: 146 lb 9.6 oz (66.497 kg)    GENERAL:alert, no distress and comfortable SKIN: skin color, texture, turgor are normal, no rashes or significant lesions EYES: normal, Conjunctiva are pink and non-injected, sclera clear OROPHARYNX:no exudate, no erythema and lips, buccal mucosa, and tongue normal  NECK: supple, thyroid normal size, non-tender, without nodularity LYMPH:  no palpable lymphadenopathy in the cervical, axillary or inguinal LUNGS: clear to auscultation and percussion with normal breathing effort HEART: regular rate & rhythm and no murmurs and no lower extremity edema ABDOMEN:abdomen soft, non-tender and normal  bowel sounds Musculoskeletal:no cyanosis of digits and no clubbing  NEURO: alert & oriented x 3 with fluent speech, no focal motor/sensory deficits She has well-healed mastectomy scar on the right breast with no signs of recurrence. Normal breast exam on the left LABORATORY DATA:  I have reviewed the data as listed    Component Value Date/Time   NA 142 03/14/2015 1107   NA 142 09/09/2014 1255   NA 143 08/09/2013 0934   K 4.5 03/14/2015 1107   K 3.4* 08/09/2013 0934   CL 101 03/14/2015 1107   CO2 23 03/14/2015 1107   CO2 25 08/09/2013 0934   GLUCOSE 89 03/14/2015 1107   GLUCOSE 76 09/09/2014 1255   GLUCOSE 192* 08/09/2013 0934   BUN 20 03/14/2015 1107   BUN 19 09/09/2014 1255   BUN 27.3* 08/09/2013 0934   CREATININE 0.97 03/14/2015 1107   CREATININE 1.15* 05/16/2014 1036   CREATININE 1.4* 08/09/2013 0934   CALCIUM 10.2 03/14/2015 1107   CALCIUM 10.3 08/09/2013 0934   PROT 6.9 12/09/2013 1034   PROT 7.4 08/09/2013 0934   ALBUMIN 4.1 12/09/2013 1034   ALBUMIN 3.9 08/09/2013 0934   AST 16 12/09/2013 1034   AST 16 08/09/2013  0934   ALT <8 12/09/2013 1034   ALT 12 08/09/2013 0934   ALKPHOS 59 12/09/2013 1034   ALKPHOS 71 08/09/2013 0934   BILITOT 0.3 12/09/2013 1034   BILITOT <0.20 08/09/2013 0934   GFRNONAA 62 03/14/2015 1107   GFRNONAA 51* 05/16/2014 1036   GFRAA 71 03/14/2015 1107   GFRAA 59* 05/16/2014 1036    No results found for: SPEP, UPEP  Lab Results  Component Value Date   WBC 9.5 06/21/2014   NEUTROABS 6.8 06/21/2014   HGB 11.9* 09/09/2014   HCT 35.0* 09/09/2014   MCV 90.8 06/21/2014   PLT 288 06/21/2014      Chemistry      Component Value Date/Time   NA 142 03/14/2015 1107   NA 142 09/09/2014 1255   NA 143 08/09/2013 0934   K 4.5 03/14/2015 1107   K 3.4* 08/09/2013 0934   CL 101 03/14/2015 1107   CO2 23 03/14/2015 1107   CO2 25 08/09/2013 0934   BUN 20 03/14/2015 1107   BUN 19 09/09/2014 1255   BUN 27.3* 08/09/2013 0934   CREATININE 0.97  03/14/2015 1107   CREATININE 1.15* 05/16/2014 1036   CREATININE 1.4* 08/09/2013 0934      Component Value Date/Time   CALCIUM 10.2 03/14/2015 1107   CALCIUM 10.3 08/09/2013 0934   ALKPHOS 59 12/09/2013 1034   ALKPHOS 71 08/09/2013 0934   AST 16 12/09/2013 1034   AST 16 08/09/2013 0934   ALT <8 12/09/2013 1034   ALT 12 08/09/2013 0934   BILITOT 0.3 12/09/2013 1034   BILITOT <0.20 08/09/2013 0934      ASSESSMENT & PLAN:  BREAST CANCER, HX OF She was diagnosed more than 15 years ago. Her records were not available, but from what the patient recall, she had a estrogen/progesterone receptor negative breast cancer. Clinically, she has no evidence of recurrence. When she was diagnosed, she was very young. She recalls seeing a Dietitian and tested negative for hereditary breast/ovary cancer mutations but her daughter was recently diagnosed with cancer and she would need to return appointment to meet with them. I gave her a prescription for mastectomy bra I will transition her care to long-term cancer survivorship clinic    Orders Placed This Encounter  Procedures  . Amb Referral to Survivorship Long term    Referral Priority:  Routine    Referral Type:  Consultation    Referred to Provider:  Holley Bouche, NP    Number of Visits Requested:  1   All questions were answered. The patient knows to call the clinic with any problems, questions or concerns. No barriers to learning was detected. I spent 15 minutes counseling the patient face to face. The total time spent in the appointment was 20 minutes and more than 50% was on counseling and review of test results     [facility], [place], MD 08/14/2015 11:39 AM

## 2015-08-16 ENCOUNTER — Other Ambulatory Visit: Payer: Self-pay | Admitting: Internal Medicine

## 2015-08-16 DIAGNOSIS — R928 Other abnormal and inconclusive findings on diagnostic imaging of breast: Secondary | ICD-10-CM

## 2015-08-21 ENCOUNTER — Other Ambulatory Visit: Payer: Medicare Other

## 2015-08-28 ENCOUNTER — Telehealth: Payer: Self-pay | Admitting: Internal Medicine

## 2015-08-28 NOTE — Telephone Encounter (Signed)
APT. REMINDER CALL, NO VOICEMAIL °

## 2015-08-29 ENCOUNTER — Ambulatory Visit (INDEPENDENT_AMBULATORY_CARE_PROVIDER_SITE_OTHER): Payer: Medicare Other | Admitting: Internal Medicine

## 2015-08-29 ENCOUNTER — Other Ambulatory Visit: Payer: Self-pay | Admitting: Internal Medicine

## 2015-08-29 ENCOUNTER — Other Ambulatory Visit: Payer: Self-pay

## 2015-08-29 ENCOUNTER — Encounter: Payer: Self-pay | Admitting: Internal Medicine

## 2015-08-29 VITALS — BP 136/59 | HR 105 | Temp 98.2°F | Ht 63.0 in | Wt 149.1 lb

## 2015-08-29 DIAGNOSIS — I10 Essential (primary) hypertension: Secondary | ICD-10-CM

## 2015-08-29 DIAGNOSIS — E1165 Type 2 diabetes mellitus with hyperglycemia: Secondary | ICD-10-CM | POA: Diagnosis not present

## 2015-08-29 DIAGNOSIS — R928 Other abnormal and inconclusive findings on diagnostic imaging of breast: Secondary | ICD-10-CM

## 2015-08-29 DIAGNOSIS — E1149 Type 2 diabetes mellitus with other diabetic neurological complication: Secondary | ICD-10-CM | POA: Diagnosis not present

## 2015-08-29 DIAGNOSIS — M6530 Trigger finger, unspecified finger: Secondary | ICD-10-CM | POA: Diagnosis not present

## 2015-08-29 LAB — POCT GLYCOSYLATED HEMOGLOBIN (HGB A1C): Hemoglobin A1C: 10.7

## 2015-08-29 LAB — GLUCOSE, CAPILLARY: Glucose-Capillary: 120 mg/dL — ABNORMAL HIGH (ref 65–99)

## 2015-08-29 MED ORDER — LIRAGLUTIDE 18 MG/3ML ~~LOC~~ SOPN: 1.2000 mg | PEN_INJECTOR | Freq: Every day | SUBCUTANEOUS | 3 refills | Status: DC

## 2015-08-29 MED ORDER — INSULIN DETEMIR 100 UNIT/ML FLEXPEN
PEN_INJECTOR | SUBCUTANEOUS | 6 refills | Status: DC
Start: 2015-08-29 — End: 2015-10-03

## 2015-08-29 MED ORDER — METFORMIN HCL 1000 MG PO TABS: 1000.0000 mg | ORAL_TABLET | Freq: Two times a day (BID) | ORAL | 6 refills | Status: DC

## 2015-08-29 NOTE — Progress Notes (Signed)
   Subjective:    Patient ID: [name], female    DOB: 11/12/1950, 65 y.o.   MRN: [id number]  HPI  Patient seen and examined. She is here for routine follow up of her DM and HTN.  She is compliant with her meds and denies any new complaints. She states her blood sugars have been up and down and she has noted low blood sugars in the morning and high blood sugars the rest of the day. She has not been checking her blood sugars regularly.   Patient states she is scheduled for a diagnostic mammogram and ultrasound of her left breast after she was noted to have asymmetry on her screening mammogram. She expresses some apprehension about this given her prior history of breast cancer.   She also states that she had surgery recently on her right hand for trigger finger and it has improved substantially   Review of Systems  Constitutional: Negative.   HENT: Negative.   Respiratory: Negative.   Cardiovascular: Negative.   Gastrointestinal: Negative.   Musculoskeletal: Positive for joint swelling. Negative for arthralgias.       Complains of mild swelling in right foot and ankle, no pain, no redness, no trauma  Skin: Negative.   Neurological: Negative.   Psychiatric/Behavioral: Negative.        Objective:   Physical Exam  Constitutional: She is oriented to person, place, and time. She appears well-developed and well-nourished.  HENT:  Head: Normocephalic and atraumatic.  Eyes: Right eye exhibits no discharge. Left eye exhibits no discharge.  Cardiovascular: Normal rate, regular rhythm and normal heart sounds.   Pulmonary/Chest: Effort normal and breath sounds normal. No respiratory distress. She has no wheezes.  Abdominal: Soft. Bowel sounds are normal. She exhibits no distension. There is no tenderness.  Musculoskeletal: Normal range of motion.  Mild swelling of right ankle, no erythema, no tenderness, no increased warmth  Neurological: She is alert and oriented to person, place, and  time.  Skin: Skin is warm. No rash noted. No erythema.  Psychiatric: She has a normal mood and affect. Her behavior is normal.          Assessment & Plan:  Please see problem based charting for assessment and plan:

## 2015-08-29 NOTE — Assessment & Plan Note (Signed)
Lab Results  Component Value Date   HGBA1C 10.7 08/29/2015   HGBA1C 8.7 06/13/2015   HGBA1C 8.1 02/24/2015     Assessment: Diabetes control:  poorly controlled Progress toward A1C goal:   deteriorated Comments: complaint with levemir but notes low blood sugars in AM and high blood sugars the rest of the day  Plan: Medications:  will decrease levemir to 25 units and increase liraglutide to 1.2 mg Home glucose monitoring: Frequency:   Timing:   Instruction/counseling given: reminded to bring blood glucose meter & log to each visit and reminded to bring medications to each visit Educational resources provided: brochure (denies need ) Self management tools provided: copy of home glucose meter download Other plans: Patient to check blood usgars 3 *day and follow up in 1 month

## 2015-08-29 NOTE — Assessment & Plan Note (Signed)
BP Readings from Last 3 Encounters:  08/29/15 (!) 136/59  08/14/15 138/67  06/22/15 140/70    Lab Results  Component Value Date   NA 142 03/14/2015   K 4.5 03/14/2015   CREATININE 0.97 03/14/2015    Assessment: Blood pressure control:  well controlled Progress toward BP goal:   at goal Comments: compliant with meds - amlodipine 10 mg  Plan: Medications:  continue current medications Educational resources provided:   Self management tools provided:   Other plans: Will check urine microalbumin and BMP on next visit. Would consider low dose ACE-I if + microalbumin

## 2015-08-29 NOTE — Patient Instructions (Addendum)
-   It was a pleasure seeing you today - Your A1C has increased to 10. We need to follow your diabetes more closely - Please check your blood sugars 3*day for the next month and follow up with me - I have decreased your levemir to 25 units every night - I increased your victoza to 1.2 mg daily - Please f/u in 1 month - Your BP has improved. Continue with your medication - Follow up for your ultrasound in AM

## 2015-08-29 NOTE — Assessment & Plan Note (Signed)
-   Patient is now s/p surgery with Dr. Burney Gauze - She has better movement in her fingers now and is able to flex and extend them without any issues - She will f/u with Dr. Burney Gauze in 1-2 weeks

## 2015-08-30 ENCOUNTER — Ambulatory Visit
Admission: RE | Admit: 2015-08-30 | Discharge: 2015-08-30 | Disposition: A | Discharge status: Home / Self Care | Payer: Medicare Other | Source: Ambulatory Visit | Attending: Internal Medicine | Admitting: Internal Medicine

## 2015-08-30 DIAGNOSIS — N6489 Other specified disorders of breast: Secondary | ICD-10-CM | POA: Diagnosis not present

## 2015-08-30 DIAGNOSIS — R922 Inconclusive mammogram: Secondary | ICD-10-CM | POA: Diagnosis not present

## 2015-08-30 DIAGNOSIS — R928 Other abnormal and inconclusive findings on diagnostic imaging of breast: Secondary | ICD-10-CM

## 2015-09-05 ENCOUNTER — Other Ambulatory Visit: Payer: Self-pay | Admitting: Internal Medicine

## 2015-09-13 ENCOUNTER — Encounter: Payer: Self-pay | Admitting: Genetic Counselor

## 2015-09-14 ENCOUNTER — Ambulatory Visit (HOSPITAL_BASED_OUTPATIENT_CLINIC_OR_DEPARTMENT_OTHER): Payer: Medicare Other | Admitting: Genetic Counselor

## 2015-09-14 ENCOUNTER — Other Ambulatory Visit: Payer: Medicare Other

## 2015-09-14 DIAGNOSIS — Z808 Family history of malignant neoplasm of other organs or systems: Secondary | ICD-10-CM

## 2015-09-14 DIAGNOSIS — Z452 Encounter for adjustment and management of vascular access device: Secondary | ICD-10-CM

## 2015-09-14 DIAGNOSIS — Z853 Personal history of malignant neoplasm of breast: Secondary | ICD-10-CM | POA: Diagnosis not present

## 2015-09-14 DIAGNOSIS — Z803 Family history of malignant neoplasm of breast: Secondary | ICD-10-CM

## 2015-09-15 ENCOUNTER — Encounter: Payer: Self-pay | Admitting: Genetic Counselor

## 2015-09-15 NOTE — Progress Notes (Signed)
REFERRING PROVIDER: Heath Lark, MD  PRIMARY PROVIDER:  Aldine Contes, MD  PRIMARY REASON FOR VISIT:  1. BREAST CANCER, HX OF   2. Family history of breast cancer in first degree relative   3. Family history of brain cancer      HISTORY OF PRESENT ILLNESS:   [name], a 65 y.o. female, was seen for a [facility] cancer genetics consultation at the request of Dr. Alvy Bimler due to a personal and family history of early-onset breast cancer.  [name] presents to clinic today with her daughter, [name], to discuss the possibility of a hereditary predisposition to cancer, genetic testing, and to further clarify her future cancer risks, as well as potential cancer risks for family members. [name] reportedly had negative BRCA1/2 genetic testing in 1999, following her breast cancer diagnosis.  She returns to clinic today to discuss updated genetic testing options.  In 1999, at the age of 17-47, [name] was diagnosed with cancer of the right breast.  Hormone receptor status was ER/PR-. This was treated with right mastectomy, chemotherapy, and radiation.  [name] reports no additional history of cancer.  HORMONAL RISK FACTORS:  Menarche was at age 63.  First live birth at age 24.  OCP use for approximately-  uncertain Ovaries intact: yes.  Hysterectomy: yes - in her 30s-40s for issues with bleeding.  Menopausal status: postmenopausal.  HRT use: premarin for 6 months. Colonoscopy: yes; most recent was 01/2015; patient reports history of only 1 polyp. Mammogram within the last year: yes. Number of breast biopsies: 1. Up to date with pelvic exams:  n/a. Any excessive radiation exposure/other exposures in the past:  Reports history of secondhand smoke exposure (her youngest daughter used to smoke)  Past Medical History:  Diagnosis Date  . Allergy   . Anemia   . Arthritis   . BREAST CANCER, HX OF 04/16/2006   Qualifier: Diagnosis of  By: Leward Quan MD, Pamala Hurry    . Cancer ([place])   .  Cataract   . Chronic kidney disease   . Depression   . Diabetes mellitus without complication ([place])   . Diverticulosis   . Glaucoma   . Hyperlipidemia   . Hypertension   . Hypothyroidism   . Left arm pain 07/28/2012  . Lymphedema of upper extremity    right  . Neuromuscular disorder ([place])   . Thyroid disease     Past Surgical History:  Procedure Laterality Date  . ABDOMINAL HYSTERECTOMY    . BREAST SURGERY    . EYE SURGERY    . FINGER SURGERY Left 2016   thumb infection (3 times so far)  . GANGLION CYST EXCISION Right 2016   right wrist  . I&D EXTREMITY Left 09/09/2014   Procedure: IRRIGATION AND DEBRIDEMENT LEFT THUMB DISTAL PHALANX;  Surgeon: Daryll Brod, MD;  Location: [facility];  Service: Orthopedics;  Laterality: Left;  [name] MASTECTOMY Right 1999  . TUBAL LIGATION      Social History   Social History  . Marital status: Divorced    Spouse name: N/A  . Number of children: N/A  . Years of education: N/A   Social History Main Topics  . Smoking status: Never Smoker  . Smokeless tobacco: Never Used  . Alcohol use No  . Drug use: No  . Sexual activity: Not Asked   Other Topics Concern  . None   Social History Narrative  . None     FAMILY HISTORY:  We obtained a detailed,  4-generation family history.  Significant diagnoses are listed below: Family History  Problem Relation Age of Onset  . Heart disease Father   . Diabetes Father   . Stroke Sister   . Anuerysm Sister 27    brain; maternal half-sister  . Heart disease Brother   . Anuerysm Brother 44    aortic; maternal half-brother  . Breast cancer Daughter 22    negative genetic testing in 2015  . Heart attack Daughter     38-47  . Diabetes Paternal Grandmother   . Stroke Paternal Grandfather   . Anuerysm Brother     NOS type; full brother  . Fibroids Daughter     s/p hysterectomy at 73y  . Brain cancer Maternal Uncle     dx. older than 76; NOS type  . Brain cancer Cousin     maternal  1st cousin; d. early 2s; NOS type  . Deafness Paternal Uncle     prelingual    [name] has two daughters and two sons, ages 51-49.  Her youngest daughter, [name], (currently 51) has a history of breast cancer, diagnosed at 66.  Her daughter had negative genetic testing in 2015.  [name] oldest daughter, [name], has a history of fibroids for which she underwent a hysterectomy at the age of 74.  [name] has only one full brother; she has two maternal half-siblings (one brother and one sister) and three paternal half-siblings (two sisters and a brother).  [name] full brother died of an unspecified type of aneurysm at age 51.  Both of [name] maternal half-siblings have passed away.  Her half-sister died of a brain aneurysm or stroke at the age of 35.  Her half-brother died of an aortic aneurysm and heart issues at age 28.  [name] paternal half-siblings are all in their 30s-mid-50s and have never had cancer.    [name] mother passed away when [name] was 65 years old; she has no further information for her or her cause of death. Her mother had four full sisters and three full brothers.  Most of her siblings passed away at later ages.  One brother died of an unspecified type of brain cancer over the age of 28.  This brother had five daughters and two sons.  One of his daughters died of an unspecified type of brain cancer in his early 24s.  [name] has limited information for her other maternal first cousins.  Her maternal grandmother died in her 13s; [name] has no information for her maternal grandfather.  She has no information for any maternal great aunts/uncles or great grandparents.  [name] father died of heart disease and diabetes at the age of 31.  He had one full sister and four full brothers.  His sister died in childhood from an unspecified cause.  His brothers all passed away, mostly at later ages, but not from any known cancers.  [name] reports no history of  cancer for her paternal first cousins.  Her paternal grandmother died of diabetes over the age of 75.  Her paternal grandfather died between 32-70, possibly from a stroke.  Patient's maternal ancestors are of [place] American descent, and paternal ancestors are of Native [place] and [place] American descent. There is no reported Ashkenazi Jewish ancestry. There is no known consanguinity.  GENETIC COUNSELING ASSESSMENT: Maesyn Frisinger is a 65 y.o. female with a personal and family history of cancer which is somewhat suggestive of a hereditary cancer syndrome  and predisposition to cancer. We, therefore, discussed and recommended the following at today's visit.   DISCUSSION: We reviewed the characteristics, features and inheritance patterns of hereditary cancer syndromes, particularly those caused by mutations within the PALB2, CHEK2, and BRCA1/2 genes. We also discussed genetic testing, including the appropriate family members to test, the process of testing, insurance coverage and turn-around-time for results. We discussed the implications of a negative, positive and/or variant of uncertain significant result. We recommended Ms. Belknap pursue genetic testing for the 20-gene Breast/Ovarian Cancer Panel through [facility].  The Breast/Ovarian Cancer Panel offered by [facility] Hope Pigeon, MD) includes sequencing and deletion/duplication analysis for the following 19 genes:  ATM, BARD1, BRCA1, BRCA2, BRIP1, CDH1, CHEK2, FANCC, MLH1, MSH2, MSH6, NBN, PALB2, PMS2, PTEN, RAD51C, RAD51D, TP53, and XRCC2.  This panel also includes deletion/duplication analysis (without sequencing) for one gene, EPCAM.  Based on Ms. Darko's personal and family history of cancer, she meets medical criteria for genetic testing. Despite that she meets criteria, she may still have an out of pocket cost. We discussed that if her out of pocket cost for testing is over $100, the laboratory will call and confirm whether  she wants to proceed with testing.  If the out of pocket cost of testing is less than $100 she will be billed by the genetic testing laboratory.   PLAN: After considering the risks, benefits, and limitations, Ms. Hollingshead  provided informed consent to pursue genetic testing and the blood sample was sent to [facility] for analysis of the 20-gene Breast/Ovarian Cancer Panel. Results should be available within approximately 2-3 weeks' time, at which point they will be disclosed by telephone to Ms. Nachreiner, as will any additional recommendations warranted by these results. Ms. Soth will receive a summary of her genetic counseling visit and a copy of her results once available. This information will also be available in Epic. We encouraged Ms. Hufstedler to remain in contact with cancer genetics annually so that we can continuously update the family history and inform her of any changes in cancer genetics and testing that may be of benefit for her family. Ms. Zentz questions were answered to her satisfaction today. Our contact information was provided should additional questions or concerns arise.  Thank you for the referral and allowing [place] to share in the care of your patient.   Jeanine Luz, MS, [facility] Certified Genetic Counselor [email or web address] Phone: [phone]  The patient was seen for a total of 60 minutes in face-to-face genetic counseling.  This patient was discussed with Drs. Magrinat, Lindi Adie and/or Burr Medico who agrees with the above.    _______________________________________________________________________ For Office Staff:  Number of people involved in session: 2 Was an Intern/ student involved with case: no

## 2015-09-29 ENCOUNTER — Telehealth: Payer: Self-pay | Admitting: Internal Medicine

## 2015-09-29 ENCOUNTER — Telehealth: Payer: Self-pay | Admitting: Genetic Counselor

## 2015-09-29 NOTE — Telephone Encounter (Signed)
Discussed with [name] that her genetic test results were negative for mutations within any of 20 genes that would cause her to be at a increased genetic risk for breast, ovarian, or other related cancers.  Additionally, no uncertain changes were found.  Discussed that this seems to be reassuring that her cancer was not genetic.  Discussed that she should call and update [place] if anyone else in the family is diagnosed with cancer.  Women in the family are still at some increased risk for cancer due to the family history.  Her daughters, sisters, and nieces should continue to get mammograms annually.  Her granddaughters can begin getting mammograms at the age of 54 due to [name]'s and her daughter's history of breast cancer at 47.  [name] is happy to receive this news.  She is welcome to call with any questions.  I will mail her a copy of her result.

## 2015-09-29 NOTE — Telephone Encounter (Signed)
AT. Joya Martyr CALL, MAIL BOX FULL

## 2015-10-03 ENCOUNTER — Encounter: Payer: Self-pay | Admitting: Internal Medicine

## 2015-10-03 ENCOUNTER — Ambulatory Visit: Payer: Self-pay | Admitting: Genetic Counselor

## 2015-10-03 ENCOUNTER — Ambulatory Visit (INDEPENDENT_AMBULATORY_CARE_PROVIDER_SITE_OTHER): Payer: Medicare Other | Admitting: Internal Medicine

## 2015-10-03 VITALS — BP 119/69 | HR 110 | Temp 98.4°F | Ht 63.0 in | Wt 146.3 lb

## 2015-10-03 DIAGNOSIS — Z794 Long term (current) use of insulin: Secondary | ICD-10-CM

## 2015-10-03 DIAGNOSIS — E1165 Type 2 diabetes mellitus with hyperglycemia: Secondary | ICD-10-CM

## 2015-10-03 DIAGNOSIS — Z23 Encounter for immunization: Secondary | ICD-10-CM

## 2015-10-03 DIAGNOSIS — E1149 Type 2 diabetes mellitus with other diabetic neurological complication: Secondary | ICD-10-CM

## 2015-10-03 DIAGNOSIS — I10 Essential (primary) hypertension: Secondary | ICD-10-CM

## 2015-10-03 DIAGNOSIS — M6530 Trigger finger, unspecified finger: Secondary | ICD-10-CM

## 2015-10-03 DIAGNOSIS — Z1379 Encounter for other screening for genetic and chromosomal anomalies: Secondary | ICD-10-CM

## 2015-10-03 DIAGNOSIS — Z808 Family history of malignant neoplasm of other organs or systems: Secondary | ICD-10-CM

## 2015-10-03 DIAGNOSIS — N183 Chronic kidney disease, stage 3 (moderate): Secondary | ICD-10-CM | POA: Diagnosis not present

## 2015-10-03 DIAGNOSIS — Z0000 Encounter for general adult medical examination without abnormal findings: Secondary | ICD-10-CM

## 2015-10-03 DIAGNOSIS — Z803 Family history of malignant neoplasm of breast: Secondary | ICD-10-CM

## 2015-10-03 DIAGNOSIS — Z853 Personal history of malignant neoplasm of breast: Secondary | ICD-10-CM

## 2015-10-03 LAB — GLUCOSE, CAPILLARY: Glucose-Capillary: 172 mg/dL — ABNORMAL HIGH (ref 65–99)

## 2015-10-03 MED ORDER — INSULIN DETEMIR 100 UNIT/ML FLEXPEN: PEN_INJECTOR | SUBCUTANEOUS | 6 refills | Status: DC

## 2015-10-03 MED ORDER — GABAPENTIN 300 MG PO CAPS: 300.0000 mg | ORAL_CAPSULE | Freq: Every day | ORAL | 0 refills | Status: DC

## 2015-10-03 MED ORDER — LIRAGLUTIDE 18 MG/3ML ~~LOC~~ SOPN: 1.8000 mg | PEN_INJECTOR | Freq: Every day | SUBCUTANEOUS | 3 refills | Status: DC

## 2015-10-03 NOTE — Assessment & Plan Note (Signed)
Flu vaccine given today. 

## 2015-10-03 NOTE — Assessment & Plan Note (Signed)
-   Patient is now much improved s/p surgery. - Will maintain sleeve per now as per Dr. Burney Gauze. - No further w/u for now

## 2015-10-03 NOTE — Assessment & Plan Note (Addendum)
Lab Results  Component Value Date   HGBA1C 10.7 08/29/2015   HGBA1C 8.7 06/13/2015   HGBA1C 8.1 02/24/2015     Assessment: Diabetes control:  poorly controlled Progress toward A1C goal:   deteriorated Comments: compliant with metformin, insulin and liraglutide. Has been eating ice cream  Plan: Medications:  Will decrease levemir to 20 units and increase liraglutide to 1.8 mg Home glucose monitoring: Frequency:   Timing:   Instruction/counseling given: reminded to bring blood glucose meter & log to each visit and discussed diet Educational resources provided:   Self management tools provided:   Other plans: Will check BMP. Patient to monitor blood sugars closely and follow up next week. Will increase gabapentin to 300 mg for her neuropathy

## 2015-10-03 NOTE — Patient Instructions (Addendum)
- It was a pleasure seeing you today - Your diabetes is not well controlled. We will decrease your levemir to 20 units and increase your victoza to 1.8 - Please check your blood sugars 3*day and follow up in 1 week (Next Wednesday 9/13) - Your BP is well controlled. Keep up the good work  Hypoglycemia Hypoglycemia occurs when the glucose in your blood is too low. Glucose is a type of sugar that is your body's main energy source. Hormones, such as insulin and glucagon, control the level of glucose in the blood. Insulin lowers blood glucose and glucagon increases blood glucose. Having too much insulin in your blood stream, or not eating enough food containing sugar, can result in hypoglycemia. Hypoglycemia can happen to people with or without diabetes. It can develop quickly and can be a medical emergency.  CAUSES   Missing or delaying meals.  Not eating enough carbohydrates at meals.  Taking too much diabetes medicine.  Not timing your oral diabetes medicine or insulin doses with meals, snacks, and exercise.  Nausea and vomiting.  Certain medicines.  Severe illnesses, such as hepatitis, kidney disorders, and certain eating disorders.  Increased activity or exercise without eating something extra or adjusting medicines.  Drinking too much alcohol.  A nerve disorder that affects body functions like your heart rate, blood pressure, and digestion (autonomic neuropathy).  A condition where the stomach muscles do not function properly (gastroparesis). Therefore, medicines and food may not absorb properly.  Rarely, a tumor of the pancreas can produce too much insulin. SYMPTOMS   Hunger.  Sweating (diaphoresis).  Change in body temperature.  Shakiness.  Headache.  Anxiety.  Lightheadedness.  Irritability.  Difficulty concentrating.  Dry mouth.  Tingling or numbness in the hands or feet.  Restless sleep or sleep disturbances.  Altered speech and coordination.  Change  in mental status.  Seizures or prolonged convulsions.  Combativeness.  Drowsiness (lethargic).  Weakness.  Increased heart rate or palpitations.  Confusion.  Pale, gray skin color.  Blurred or double vision.  Fainting. DIAGNOSIS  A physical exam and medical history will be performed. Your caregiver may make a diagnosis based on your symptoms. Blood tests and other lab tests may be performed to confirm a diagnosis. Once the diagnosis is made, your caregiver will see if your signs and symptoms go away once your blood glucose is raised.  TREATMENT  Usually, you can easily treat your hypoglycemia when you notice symptoms.  Check your blood glucose. If it is less than 70 mg/dl, take one of the following:   3-4 glucose tablets.    cup juice.    cup regular soda.   1 cup skim milk.   -1 tube of glucose gel.   5-6 hard candies.   Avoid high-fat drinks or food that may delay a rise in blood glucose levels.  Do not take more than the recommended amount of sugary foods, drinks, gel, or tablets. Doing so will cause your blood glucose to go too high.   Wait 10-15 minutes and recheck your blood glucose. If it is still less than 70 mg/dl or below your target range, repeat treatment.   Eat a snack if it is more than 1 hour until your next meal.  There may be a time when your blood glucose may go so low that you are unable to treat yourself at home when you start to notice symptoms. You may need someone to help you. You may even faint or be unable to  swallow. If you cannot treat yourself, someone will need to bring you to the hospital.  [place]  If you have diabetes, follow your diabetes management plan by:  Taking your medicines as directed.  Following your exercise plan.  Following your meal plan. Do not skip meals. Eat on time.  Testing your blood glucose regularly. Check your blood glucose before and after exercise. If you exercise longer or  different than usual, be sure to check blood glucose more frequently.  Wearing your medical alert jewelry that says you have diabetes.  Identify the cause of your hypoglycemia. Then, develop ways to prevent the recurrence of hypoglycemia.  Do not take a hot bath or shower right after an insulin shot.  Always carry treatment with you. Glucose tablets are the easiest to carry.  If you are going to drink alcohol, drink it only with meals.  Tell friends or family members ways to keep you safe during a seizure. This may include removing hard or sharp objects from the area or turning you on your side.  Maintain a healthy weight. SEEK MEDICAL CARE IF:   You are having problems keeping your blood glucose in your target range.  You are having frequent episodes of hypoglycemia.  You feel you might be having side effects from your medicines.  You are not sure why your blood glucose is dropping so low.  You notice a change in vision or a new problem with your vision. SEEK IMMEDIATE MEDICAL CARE IF:   Confusion develops.  A change in mental status occurs.  The inability to swallow develops.  Fainting occurs.   This information is not intended to replace advice given to you by your health care provider. Make sure you discuss any questions you have with your health care provider.   Document Released: 01/14/2005 Document Revised: 01/19/2013 Document Reviewed: 09/20/2014 Elsevier Interactive Patient Education [employer].

## 2015-10-03 NOTE — Assessment & Plan Note (Signed)
BP Readings from Last 3 Encounters:  10/03/15 119/69  08/29/15 (!) 136/59  08/14/15 138/67    Lab Results  Component Value Date   NA 142 03/14/2015   K 4.5 03/14/2015   CREATININE 0.97 03/14/2015    Assessment: Blood pressure control:  well controlled Progress toward BP goal:   at goal Comments: compliant with meds - amlodipine 10 mg and HCTZ 12.5 mg  Plan: Medications:  continue current medications Educational resources provided:   Self management tools provided:   Other plans: Will check BMP today

## 2015-10-03 NOTE — Progress Notes (Signed)
   Subjective:    Patient ID: [name], female    DOB: Nov 01, 1950, 65 y.o.   MRN: [id number]  HPI  Patient seen and examined. She is here for routine follow up of her DM and HTN, She states she is compliant with her meds and has no acute complaints.  She does notice a lot of variability in her blood sugars including episodes of hypoglycemia especially early in the morning and episodes of hyperglycemia in the 300s. Her last hypoglycemic episode was this morning when she noted a blood sugar of 44 and she didn't feel like herself. On her last visit her levemir was reduced to 25 units and her liraglutide was increased to 1.2 mgs She also complains of persistent burning in her feet over the last month despite low dose gabapentin.   Review of Systems  Constitutional: Negative.   HENT: Negative.   Eyes: Negative.   Respiratory: Negative.   Cardiovascular: Negative.   Gastrointestinal: Negative.   Musculoskeletal: Negative.   Skin: Negative.   Neurological: Negative.   Psychiatric/Behavioral: Negative.        Objective:   Physical Exam  Constitutional: She is oriented to person, place, and time. She appears well-developed and well-nourished.  HENT:  Head: Normocephalic and atraumatic.  Cardiovascular: Normal rate, regular rhythm and normal heart sounds.   Pulmonary/Chest: Effort normal and breath sounds normal. No respiratory distress. She has no wheezes.  Abdominal: Soft. Bowel sounds are normal. She exhibits no distension. There is no tenderness.  Musculoskeletal: Normal range of motion. She exhibits no edema.  R hand in sleeve per ortho instructions. Able to straighten and close all her fingers now  Neurological: She is alert and oriented to person, place, and time.  Skin: Skin is warm. No rash noted. No erythema.  Psychiatric: She has a normal mood and affect. Her behavior is normal.          Assessment & Plan:  Please see problem based charting for assessment and  plan:

## 2015-10-04 ENCOUNTER — Telehealth: Payer: Self-pay | Admitting: Internal Medicine

## 2015-10-04 LAB — BMP8+ANION GAP
Anion Gap: 22 mmol/L — ABNORMAL HIGH (ref 10.0–18.0)
BUN/Creatinine Ratio: 19 (ref 12–28)
BUN: 19 mg/dL (ref 8–27)
CO2: 24 mmol/L (ref 18–29)
Calcium: 9.7 mg/dL (ref 8.7–10.3)
Chloride: 93 mmol/L — ABNORMAL LOW (ref 96–106)
Creatinine, Ser: 0.99 mg/dL (ref 0.57–1.00)
GFR calc Af Amer: 69 mL/min/{1.73_m2} (ref >59)
GFR calc non Af Amer: 60 mL/min/{1.73_m2} (ref >59)
Glucose: 135 mg/dL — ABNORMAL HIGH (ref 65–99)
Potassium: 3.5 mmol/L (ref 3.5–5.2)
Sodium: 139 mmol/L (ref 134–144)

## 2015-10-04 NOTE — Telephone Encounter (Signed)
Results of bloodwork discussed with patient over the phone. BMP is stable. No further w/u for now. Patient to f/u for her DM next week

## 2015-10-05 ENCOUNTER — Other Ambulatory Visit: Payer: Self-pay | Admitting: *Deleted

## 2015-10-05 ENCOUNTER — Telehealth: Payer: Self-pay | Admitting: *Deleted

## 2015-10-05 MED ORDER — ONETOUCH ULTRASOFT LANCETS MISC: 3 refills | Status: DC

## 2015-10-05 NOTE — Telephone Encounter (Signed)
Janett Billow calling from pharmacy requesting the patient pen needles to be refilled.

## 2015-10-06 ENCOUNTER — Encounter (INDEPENDENT_AMBULATORY_CARE_PROVIDER_SITE_OTHER): Payer: Medicare Other | Admitting: Ophthalmology

## 2015-10-06 DIAGNOSIS — H35033 Hypertensive retinopathy, bilateral: Secondary | ICD-10-CM

## 2015-10-06 DIAGNOSIS — E113391 Type 2 diabetes mellitus with moderate nonproliferative diabetic retinopathy without macular edema, right eye: Secondary | ICD-10-CM | POA: Diagnosis not present

## 2015-10-06 DIAGNOSIS — I10 Essential (primary) hypertension: Secondary | ICD-10-CM | POA: Diagnosis not present

## 2015-10-06 DIAGNOSIS — C50911 Malignant neoplasm of unspecified site of right female breast: Secondary | ICD-10-CM | POA: Diagnosis not present

## 2015-10-06 DIAGNOSIS — H43813 Vitreous degeneration, bilateral: Secondary | ICD-10-CM

## 2015-10-06 DIAGNOSIS — E11311 Type 2 diabetes mellitus with unspecified diabetic retinopathy with macular edema: Secondary | ICD-10-CM

## 2015-10-06 DIAGNOSIS — E113312 Type 2 diabetes mellitus with moderate nonproliferative diabetic retinopathy with macular edema, left eye: Secondary | ICD-10-CM | POA: Diagnosis not present

## 2015-10-09 MED ORDER — INSULIN PEN NEEDLE 32G X 4 MM MISC: 3 refills | Status: DC

## 2015-10-10 ENCOUNTER — Telehealth: Payer: Self-pay | Admitting: Internal Medicine

## 2015-10-10 NOTE — Telephone Encounter (Signed)
APT. REMINDER CALL, VOICEMAIL FULL

## 2015-10-11 ENCOUNTER — Ambulatory Visit (INDEPENDENT_AMBULATORY_CARE_PROVIDER_SITE_OTHER): Payer: Medicare Other | Admitting: Internal Medicine

## 2015-10-11 VITALS — BP 138/70 | HR 89 | Temp 98.5°F | Ht 63.0 in | Wt 146.7 lb

## 2015-10-11 DIAGNOSIS — E1149 Type 2 diabetes mellitus with other diabetic neurological complication: Secondary | ICD-10-CM

## 2015-10-11 DIAGNOSIS — E1165 Type 2 diabetes mellitus with hyperglycemia: Secondary | ICD-10-CM | POA: Diagnosis not present

## 2015-10-11 DIAGNOSIS — E1142 Type 2 diabetes mellitus with diabetic polyneuropathy: Secondary | ICD-10-CM | POA: Diagnosis not present

## 2015-10-11 DIAGNOSIS — Z794 Long term (current) use of insulin: Secondary | ICD-10-CM

## 2015-10-11 MED ORDER — INSULIN ASPART 100 UNIT/ML FLEXPEN: 3.0000 [IU] | PEN_INJECTOR | Freq: Three times a day (TID) | SUBCUTANEOUS | 11 refills | Status: DC

## 2015-10-11 MED ORDER — INSULIN DETEMIR 100 UNIT/ML FLEXPEN: PEN_INJECTOR | SUBCUTANEOUS | 6 refills | Status: DC

## 2015-10-11 MED ORDER — PREGABALIN 50 MG PO CAPS: 50.0000 mg | ORAL_CAPSULE | Freq: Three times a day (TID) | ORAL | 2 refills | Status: DC

## 2015-10-11 NOTE — Patient Instructions (Addendum)
Thank you for your visit today!  Today we adjusted your diabetes medications, you are now taking: - Levemir 15 units at night - Novolog insulin 3 units, three times daily, right before meals - Lyrica 50 mg three times daily for neuropathy pain  Please continue to check your blood sugars. Give [place] a call if they are running very high or very low. We hope to see you back in one week to make sure things are going well.   We have also put in a referral to see Endocrinology in the near future. These are doctors that specialize in diabetes management and other disorders.   If you have any questions or concerns, please call the clinic at [phone] or if it is the weekend or after hours, you may call [phone] and ask for the internal medicine resident on call.

## 2015-10-11 NOTE — Assessment & Plan Note (Addendum)
Continues to have poor glucose control with low readings (60s/70s) in the morning and at lunchtime every few days, also consistently in the 200s/300s every afternoon/evening. Upon review of her glucometer read out she is consistently controlled/low in the morning and tends to run high in the evenings. Patient also states that since increasing her gabapentin to 300 mg daily she has been very drowsy, and fell out of bed once in the morning, with only some improvement in her neuropathy pain. She denies any other falls or injury at this time.  Plan: - Reduce Levemir to 15 units nightly - Start NovoLog 3 units, 3 times daily with meals - Discontinue Gabapentin - Start Lyrica 50 mg 3 times daily - Placed ambulatory referral to endocrinology

## 2015-10-11 NOTE — Progress Notes (Signed)
   CC: Uncontrolled diabetes, drowsiness with medication  HPI:  [name] is a 65 y.o. female with PMHx detailed below presenting for diabetes management, also endorses increased drowsiness and one fall since her Gabapentin dose was increased to 300 mg at her previous visit. Her blood sugars continue to be labile, several AM readings in 60s/70s - occurs every 2-3 days, and then has sugars consistently in 200s/300s every afternoon/evening.    See problem based assessment and plan below for additional details.  Past Medical History:  Diagnosis Date  . Allergy   . Anemia   . Arthritis   . BREAST CANCER, HX OF 04/16/2006   Qualifier: Diagnosis of  By: Leward Quan MD, Pamala Hurry    . Cancer ([place])   . Cataract   . Chronic kidney disease   . Depression   . Diabetes mellitus without complication ([place])   . Diverticulosis   . Glaucoma   . Hyperlipidemia   . Hypertension   . Hypothyroidism   . Left arm pain 07/28/2012  . Lymphedema of upper extremity    right  . Neuromuscular disorder ([place])   . Thyroid disease    Review of Systems: Review of Systems  Constitutional: Negative for chills, fever, malaise/fatigue and weight loss.  Respiratory: Negative for cough and shortness of breath.   Cardiovascular: Negative for chest pain.  Gastrointestinal: Negative for abdominal pain.  Genitourinary: Negative for dysuria.  Musculoskeletal: Positive for falls.  Neurological: Positive for tingling.       Positive for drowsiness, peripheral neuropathy    Physical Exam: Vitals:   10/11/15 1101  BP: 138/70  Pulse: 89  Temp: 98.5 F (36.9 C)  TempSrc: Oral  SpO2: 100%  Weight: 146 lb 11.2 oz (66.5 kg)  Height: 5\' 3"  (1.6 m)   GENERAL- Elderly woman sitting comfortably in exam room chair, alert, in no distress HEENT- Atraumatic, PERRL, EOMI, moist mucous membranes CARDIAC- Regular rate and rhythm, no murmurs, rubs or gallops. RESP- Clear to ascultation bilaterally, no wheezing or crackles,  normal work of breathing ABDOMEN- Normoactive bowel sounds, soft, nontender, nondistended EXTREMITIES- Normal bulk and range of motion, no edema, 1+ peripheral pulses SKIN- Warm, dry, intact, without visible rash PSYCH- Appropriate affect, clear speech, thoughts linear and goal-directed  Assessment & Plan:   See encounters tab for problem based medical decision making.  Patient seen with Dr. Dareen Piano

## 2015-10-12 NOTE — Progress Notes (Signed)
Internal Medicine Clinic Attending  Case discussed with Dr. Johnson at the time of the visit.  We reviewed the resident's history and exam and pertinent patient test results.  I agree with the assessment, diagnosis, and plan of care documented in the resident's note.  

## 2015-10-18 ENCOUNTER — Ambulatory Visit: Payer: Medicare Other | Admitting: Dietician

## 2015-10-18 ENCOUNTER — Other Ambulatory Visit: Payer: Self-pay | Admitting: Internal Medicine

## 2015-10-18 ENCOUNTER — Ambulatory Visit: Payer: Medicare Other | Admitting: Pharmacist

## 2015-10-18 ENCOUNTER — Ambulatory Visit (INDEPENDENT_AMBULATORY_CARE_PROVIDER_SITE_OTHER): Payer: Medicare Other | Admitting: Internal Medicine

## 2015-10-18 VITALS — BP 132/71 | HR 93 | Temp 98.2°F | Ht 63.0 in | Wt 146.4 lb

## 2015-10-18 DIAGNOSIS — E785 Hyperlipidemia, unspecified: Secondary | ICD-10-CM

## 2015-10-18 DIAGNOSIS — R252 Cramp and spasm: Secondary | ICD-10-CM | POA: Diagnosis not present

## 2015-10-18 DIAGNOSIS — E1149 Type 2 diabetes mellitus with other diabetic neurological complication: Secondary | ICD-10-CM | POA: Diagnosis not present

## 2015-10-18 DIAGNOSIS — E1142 Type 2 diabetes mellitus with diabetic polyneuropathy: Secondary | ICD-10-CM

## 2015-10-18 DIAGNOSIS — Z79899 Other long term (current) drug therapy: Secondary | ICD-10-CM

## 2015-10-18 DIAGNOSIS — Z0000 Encounter for general adult medical examination without abnormal findings: Secondary | ICD-10-CM

## 2015-10-18 DIAGNOSIS — E1165 Type 2 diabetes mellitus with hyperglycemia: Secondary | ICD-10-CM | POA: Diagnosis not present

## 2015-10-18 DIAGNOSIS — Z794 Long term (current) use of insulin: Secondary | ICD-10-CM

## 2015-10-18 LAB — GLUCOSE, CAPILLARY: Glucose-Capillary: 195 mg/dL — ABNORMAL HIGH (ref 65–99)

## 2015-10-18 MED ORDER — PNEUMOCOCCAL 13-VAL CONJ VACC IM SUSP
0.5000 mL | Freq: Once | INTRAMUSCULAR | Status: AC
  Administered 2015-10-18: 0.5 mL via INTRAMUSCULAR

## 2015-10-18 MED ORDER — GLUCOSE BLOOD VI STRP: ORAL_STRIP | 3 refills | Status: DC

## 2015-10-18 MED ORDER — ONETOUCH VERIO FLEX SYSTEM W/DEVICE KIT: 1.0000 "application " | PACK | Freq: Three times a day (TID) | 0 refills | Status: DC

## 2015-10-18 MED ORDER — POTASSIUM CHLORIDE 20 MEQ/15ML (10%) PO SOLN: 20.0000 meq | Freq: Every day | ORAL | 0 refills | Status: DC | PRN

## 2015-10-18 NOTE — Progress Notes (Deleted)
Discussed meter options and costs with patient per staff request and how to report problems with her meter to [facility]. Discussed glycemic control with Dr. Wynetta Emery and patient. Provided patient with food, insulin, blood sugar logbook and educated heron how to fill it in/ use it.

## 2015-10-18 NOTE — Assessment & Plan Note (Addendum)
Provided [id number], 1 of 2 today

## 2015-10-18 NOTE — Patient Instructions (Addendum)
Please continue to take your medications and insulin as prescribed, and continue to check your blood sugar.  We have sent for a replacement glucose meter that you can pick up at your pharmacy along with your test strip refill.  We will call if your potassium or magnesium lab work comes back low and advise you to take oral supplements in the near future.  Thank you for visiting today!

## 2015-10-18 NOTE — Assessment & Plan Note (Addendum)
Glucometer giving her error messages often since last visit, error checked by Dr. Maudie Mercury, likely a hardware issue and glucometer will be replaced. The few readings that Ms. Belli remembers range from 80s to 300s. She denies any symptoms since last visit consistent with low blood sugars, she cannot tell when she runs high. She is requesting refill on her testing strips. It is possible that her levemir is losing it 24-hour coverage effectiveness over time in this patient who has had diabetes for quite some time.   With regards to her peripheral neuropathy, since discontinuing gabapentin and starting Lyrica her pain is under better control and she has less daytime drowsiness. Overall pleased with this medication.  Plan: - Replace glucometer - Return in 2-3 weeks for DM recheck, advised patient to return sooner if sugars running 200s+ or low - If current BG pattern persists (low AM and high in PM) persist on current insulin regimen - change/divide levemir dosing to AM/PM or switch her to a new long-acting insulin that has better 24-hour coverage - Refilled test strips - Continue Lyrica 50mg  TID for neuropathy

## 2015-10-18 NOTE — Progress Notes (Signed)
Internal Medicine Clinic Attending  I saw and evaluated the patient.  I personally confirmed the key portions of the history and exam documented by Dr. Johnson and I reviewed pertinent patient test results.  The assessment, diagnosis, and plan were formulated together and I agree with the documentation in the resident's note.  

## 2015-10-18 NOTE — Assessment & Plan Note (Signed)
[name] is cramping in the muscles of her lower right leg past couple days, feels similar to when her potassium is low in the past. Mentions having to take potassium solution in the past.  Plan: Check BMP, Mg today Rx potassium chloride solution - will call upon lab result to advise patient to supplement or not

## 2015-10-18 NOTE — Progress Notes (Signed)
Consulted with pt to troubleshoot BG monitor. Monitor is not working correctly; pt reports having to go through several strips (~4 before getting a reading). BG meter is a OneTouch verio flex.  Pt took own blood glucose under supervision and the meter yielded the error message "Er 4."  Er 4 = not enough blood applied to meter, damaged strips, and/or damaged meter. Pt applied enough blood for reading therefore meter/test strips are the likely cause. Will send rx for new meter.  Terald Sleeper PharmD Candidate, c/o 2019 10/18/2015 11:00 AM

## 2015-10-18 NOTE — Progress Notes (Signed)
   CC: Diabetes, leg cramps  HPI:  [name] is a 65 y.o. female with PMHx detailed below presenting for diabetes recheck after her insulin was adjusted at last visit, as well as her neuropathy medication. She also endorses a few days of right leg cramps and feels like her "potassium is low like it has been in the past".  See problem based assessment and plan below for additional details.  Past Medical History:  Diagnosis Date  . Allergy   . Anemia   . Arthritis   . BREAST CANCER, HX OF 04/16/2006   Qualifier: Diagnosis of  By: Leward Quan MD, Pamala Hurry    . Cancer ([place])   . Cataract   . Chronic kidney disease   . Depression   . Diabetes mellitus without complication ([place])   . Diverticulosis   . Glaucoma   . Hyperlipidemia   . Hypertension   . Hypothyroidism   . Left arm pain 07/28/2012  . Lymphedema of upper extremity    right  . Neuromuscular disorder ([place])   . Thyroid disease    Review of Systems: Review of Systems  Constitutional: Negative for fever and malaise/fatigue.  Respiratory: Negative for shortness of breath.   Cardiovascular: Negative for chest pain.  Gastrointestinal: Negative for abdominal pain.  Musculoskeletal: Negative for falls.       Muscle cramping  Neurological: Positive for tingling. Negative for focal weakness.    Physical Exam: Vitals:   10/18/15 1030  BP: 132/71  Pulse: 93  Temp: 98.2 F (36.8 C)  TempSrc: Oral  SpO2: 100%  Weight: 146 lb 6.4 oz (66.4 kg)  Height: 5\' 3"  (1.6 m)   Body mass index is 25.93 kg/m. GENERAL- Woman sitting comfortably in exam room chair, alert, in no distress, conversational HEENT- Atraumatic, moist mucous membranes CARDIAC- Regular rate and rhythm, no murmurs, rubs or gallops. RESP- Clear to ascultation bilaterally, no wheezing or crackles, normal work of breathing EXTREMITIES- Normal bulk and range of motion, no edema, 2+ peripheral pulses, glove on right hand SKIN- Warm, dry, intact, without visible  rash PSYCH- Appropriate affect, clear speech, thoughts linear and goal-directed  Assessment & Plan:   See encounters tab for problem based medical decision making.  Patient seen with Dr. Angelia Mould

## 2015-10-18 NOTE — Assessment & Plan Note (Signed)
Last checked 09/2014, endorses compliance with Pravachol daily, recheck today to evaluated for adequate HLD control / health maintenance.

## 2015-10-19 ENCOUNTER — Ambulatory Visit (INDEPENDENT_AMBULATORY_CARE_PROVIDER_SITE_OTHER): Payer: Medicare Other | Admitting: Podiatry

## 2015-10-19 ENCOUNTER — Encounter: Payer: Self-pay | Admitting: Podiatry

## 2015-10-19 VITALS — BP 147/82 | HR 101 | Resp 14

## 2015-10-19 DIAGNOSIS — E1149 Type 2 diabetes mellitus with other diabetic neurological complication: Secondary | ICD-10-CM

## 2015-10-19 DIAGNOSIS — B351 Tinea unguium: Secondary | ICD-10-CM

## 2015-10-19 DIAGNOSIS — M79676 Pain in unspecified toe(s): Secondary | ICD-10-CM

## 2015-10-19 DIAGNOSIS — L84 Corns and callosities: Secondary | ICD-10-CM | POA: Diagnosis not present

## 2015-10-19 LAB — MAGNESIUM: Magnesium: 1.6 mg/dL (ref 1.6–2.3)

## 2015-10-19 LAB — BMP8+ANION GAP
Anion Gap: 17 mmol/L (ref 10.0–18.0)
BUN/Creatinine Ratio: 16 (ref 12–28)
BUN: 15 mg/dL (ref 8–27)
CO2: 28 mmol/L (ref 18–29)
Calcium: 10.1 mg/dL (ref 8.7–10.3)
Chloride: 96 mmol/L (ref 96–106)
Creatinine, Ser: 0.96 mg/dL (ref 0.57–1.00)
GFR calc Af Amer: 72 mL/min/{1.73_m2} (ref >59)
GFR calc non Af Amer: 62 mL/min/{1.73_m2} (ref >59)
Glucose: 188 mg/dL — ABNORMAL HIGH (ref 65–99)
Potassium: 3.9 mmol/L (ref 3.5–5.2)
Sodium: 141 mmol/L (ref 134–144)

## 2015-10-19 LAB — LIPID PANEL
Chol/HDL Ratio: 3.6 ratio units (ref 0.0–4.4)
Cholesterol, Total: 190 mg/dL (ref 100–199)
HDL: 53 mg/dL (ref >39)
LDL Calculated: 118 mg/dL — ABNORMAL HIGH (ref 0–99)
Triglycerides: 93 mg/dL (ref 0–149)
VLDL Cholesterol Cal: 19 mg/dL (ref 5–40)

## 2015-10-19 NOTE — Progress Notes (Signed)
Patient ID: [name], female   DOB: 07/22/1950, 65 y.o.   MRN: [id number] Complaint:  Visit Type: Patient returns to my office for continued preventative foot care services. Complaint: Patient states" my nails have grown long and thick and become painful to walk and wear shoes" Patient has been diagnosed with DM with no foot complications. The patient presents for preventative foot care services. No changes to ROS.  Painful callus under the ball of left foot.  Podiatric Exam: Vascular: dorsalis pedis and posterior tibial pulses are palpable bilateral. Capillary return is immediate. Temperature gradient is WNL. Skin turgor WNL  Sensorium: Normal Semmes Weinstein monofilament test. Normal tactile sensation bilaterally. Nail Exam: Pt has thick disfigured discolored nails with subungual debris noted bilateral entire nail hallux through fifth toenails Ulcer Exam: There is no evidence of ulcer or pre-ulcerative changes or infection. Orthopedic Exam: Muscle tone and strength are WNL. No limitations in general ROM. No crepitus or effusions noted. Foot type and digits show no abnormalities. Bony prominences are unremarkable. Skin: No Porokeratosis. No infection or ulcers.  Heloma durum fifth toes B/L s/p surgery by [facility].  symptomatic porokeratosis sub 1 left foot.  Diagnosis:  Onychomycosis, , Pain in right toe, pain in left toes,  Heloma durum  B/L  Porokeratosis sub 1 left foot.  Treatment & Plan Procedures and Treatment: Consent by patient was obtained for treatment procedures. The patient understood the discussion of treatment and procedures well. All questions were answered thoroughly reviewed. Debridement of mycotic and hypertrophic toenails, 1 through 5 bilateral and clearing of subungual debris. No ulceration, no infection noted.  Return Visit-Office Procedure: Patient instructed to return to the office for a follow up visit 3 months for continued evaluation and treatment.  Armenta Erskin  DPM 

## 2015-10-19 NOTE — Progress Notes (Signed)
Patient was seen in clinic with Terald Sleeper, PharmD candidate. I agree with the assessment and plan of care documented.

## 2015-10-26 ENCOUNTER — Other Ambulatory Visit: Payer: Self-pay | Admitting: Oncology

## 2015-10-27 DIAGNOSIS — C50911 Malignant neoplasm of unspecified site of right female breast: Secondary | ICD-10-CM | POA: Diagnosis not present

## 2015-11-07 ENCOUNTER — Ambulatory Visit (INDEPENDENT_AMBULATORY_CARE_PROVIDER_SITE_OTHER): Payer: Medicare Other | Admitting: Internal Medicine

## 2015-11-07 VITALS — BP 132/68 | HR 93 | Temp 98.8°F | Wt 140.7 lb

## 2015-11-07 DIAGNOSIS — Z794 Long term (current) use of insulin: Secondary | ICD-10-CM

## 2015-11-07 DIAGNOSIS — K1329 Other disturbances of oral epithelium, including tongue: Secondary | ICD-10-CM

## 2015-11-07 DIAGNOSIS — E1165 Type 2 diabetes mellitus with hyperglycemia: Secondary | ICD-10-CM

## 2015-11-07 DIAGNOSIS — M48061 Spinal stenosis, lumbar region without neurogenic claudication: Secondary | ICD-10-CM | POA: Diagnosis not present

## 2015-11-07 DIAGNOSIS — B370 Candidal stomatitis: Secondary | ICD-10-CM | POA: Insufficient documentation

## 2015-11-07 DIAGNOSIS — M545 Low back pain, unspecified: Secondary | ICD-10-CM

## 2015-11-07 DIAGNOSIS — E1142 Type 2 diabetes mellitus with diabetic polyneuropathy: Secondary | ICD-10-CM

## 2015-11-07 DIAGNOSIS — E1149 Type 2 diabetes mellitus with other diabetic neurological complication: Secondary | ICD-10-CM

## 2015-11-07 LAB — GLUCOSE, CAPILLARY: Glucose-Capillary: 545 mg/dL (ref 65–99)

## 2015-11-07 MED ORDER — NYSTATIN 100000 UNIT/ML MT SUSP: 5.0000 mL | Freq: Four times a day (QID) | OROMUCOSAL | 0 refills | Status: DC

## 2015-11-07 MED ORDER — INSULIN DETEMIR 100 UNIT/ML FLEXPEN: 20.0000 [IU] | PEN_INJECTOR | Freq: Every day | SUBCUTANEOUS | 6 refills | Status: DC

## 2015-11-07 NOTE — Assessment & Plan Note (Addendum)
Lab Results  Component Value Date   HGBA1C 10.7 08/29/2015   HGBA1C 8.7 06/13/2015   HGBA1C 8.1 02/24/2015   Replaced glucometer after 9/20 visit since it was giving error messages.  Reports home CBGs 120s-130s AM and PM, but again did not bring meter.  Taking Victoza 1.8 mg daily, metformin 1000 mg BID, and levemir 15U QHS.  Assessment HgbA1c goal: 7.0 Blood Sugar control: uncontrolled, worsening  Has had worsening glycemic control chronically, reflected by her increasing A1c, and now acutely with osmotic diuresis, oral candidiasis, and random BG in 500s today.  Unclear why her diabetes is worsening, with no infection, dietary indiscretions, or reported medication noncompliance to precipitate increasing insulin requirement.  No signs of HHS or DKA or indications for urgent/emergent interventions.  Plan Medications: increase levemir to 20U QHS.  Continue victoza and metformin Other: RTC 2 weeks.  Check BGs twice per day and bring meter to next appointment.

## 2015-11-07 NOTE — Assessment & Plan Note (Signed)
Soft white, superficial lingual plaques consistent with oral candidiasis.  Likely precipitated by worsening hyperglycemia.  -Nystatin swish and swallow 4x daily for 3 days

## 2015-11-07 NOTE — Progress Notes (Signed)
   CC: back pain and thrush  HPI:  [name] is a 65 y.o. woman with history of DM2, HTN, and low back pain who presents with polydipsia, polyuria, soft white plaques on her tongue, and low back pain.  She has had 2-3 d low pack pain after standing for prolonged period of time at parade on Saturday, bilateral low back soreness, which prompted her to make this clinic appointment.  Baseline numbness/tingling from diabetic neuropathy is unchanged.  No new weakness in legs or shooting pains.  Took Tylenol 1x on Sun which helped.    She has also noticed 1-2 weeks of polydipsia and polyuria, and 1-2 days of white plaques on her tongue and sore throat which she thinks is thrush.  She reports compliance with her diabetes meds, and home BGs 120s-130s.  Past Medical History:  Diagnosis Date  . Allergy   . Anemia   . Arthritis   . BREAST CANCER, HX OF 04/16/2006   Qualifier: Diagnosis of  By: Leward Quan MD, Pamala Hurry    . Cancer ([place])   . Cataract   . Chronic kidney disease   . Depression   . Diabetes mellitus without complication ([place])   . Diverticulosis   . Glaucoma   . Hyperlipidemia   . Hypertension   . Hypothyroidism   . Left arm pain 07/28/2012  . Lymphedema of upper extremity    right  . Neuromuscular disorder ([place])   . Thyroid disease     Review of Systems:   Review of Systems  Constitutional: Negative for chills and fever.  Respiratory: Negative for shortness of breath.   Cardiovascular: Negative for chest pain.  Neurological: Negative for headaches.  Endo/Heme/Allergies: Positive for polydipsia.    Physical Exam:  Vitals:   11/07/15 0940  BP: 132/68  Pulse: 93  Temp: 98.8 F (37.1 C)  TempSrc: Oral  SpO2: 100%  Weight: 140 lb 11.2 oz (63.8 kg)   Physical Exam  Constitutional: She appears well-developed and well-nourished. No distress.  HENT:  Mouth with white plaques covering dorsum of tongue, easily scraped off with tongue depressor  Cardiovascular: Normal  rate and regular rhythm.   Pulmonary/Chest: Effort normal and breath sounds normal.  Musculoskeletal:  No midline back tenderness Bilateral mild tenderness to palpation of lumbar paraspinal muscles Straight leg raise negative  Neurological:  Strength 5/5 symmetric hip flexion, knee flex and ext, dorsi and plantar flex Sensation grossly intact to light touch throughout bilateral LEs  Skin: Skin is warm and dry.  Psychiatric: She has a normal mood and affect. Her behavior is normal.    BMP Latest Ref Rng & Units 10/18/2015 10/03/2015 03/14/2015  Glucose 65 - 99 mg/dL 188(H) 135(H) 89  BUN 8 - 27 mg/dL 15 19 20   Creatinine 0.57 - 1.00 mg/dL 0.96 0.99 0.97  BUN/Creat Ratio 12 - 28 16 19 21   Sodium 134 - 144 mmol/L 141 139 142  Potassium 3.5 - 5.2 mmol/L 3.9 3.5 4.5  Chloride 96 - 106 mmol/L 96 93(L) 101  CO2 18 - 29 mmol/L 28 24 23   Calcium 8.7 - 10.3 mg/dL 10.1 9.7 10.2    CBG (last 3)   Recent Labs  11/07/15 1044  GLUCAP 545*     Assessment & Plan:   See Encounters Tab for problem based charting.  Patient seen with Dr. Eppie Gibson

## 2015-11-07 NOTE — Assessment & Plan Note (Signed)
Neurologically intact, no fevers, no radicular symptoms, no bony tenderness.  Low concern for epidural abscess or fracture.  Has radiologically diagnosed lumbar stenosis, but low concern for cauda equina. Responded well to single dose of Tylenol.  Findings consistent with lumbar strain. -Conservative management with tylenol for pain

## 2015-11-07 NOTE — Patient Instructions (Addendum)
You were seen in clinic today for lower back pain and thrush.  I think your low back pain is from a mild muscle strain as is not dangerous.  You can take Tylenol, no more than 3000 mg total per day.  I expect the pain to improve in the next 1-2 weeks.  If it does not, or you have worsening problems with your bowels, bladder, or shooting pains down your legs, please call the clinic immediately.  You have thrush because your blood sugars have gotten out of control.  Please check your sugar twice per day, in the morning and before bed on one day and before lunch and before dinner on every other day.  Bring your meter to your next appointment.    I have increased your nighttime insulin from 15U to 20U before bed.  For the thrush, I have prescribed nystatin mouthwash.  Take 5 mLs 4 times per day for the next 3 days.  Swish it around in your mouth for 5 minutes, then swallow.  We will see you back in clinic in 2-3 weeks with your glucose meter.

## 2015-11-09 ENCOUNTER — Ambulatory Visit (INDEPENDENT_AMBULATORY_CARE_PROVIDER_SITE_OTHER): Payer: Medicare Other | Admitting: Internal Medicine

## 2015-11-09 VITALS — BP 127/67 | HR 89 | Temp 98.6°F | Wt 138.7 lb

## 2015-11-09 DIAGNOSIS — E1142 Type 2 diabetes mellitus with diabetic polyneuropathy: Secondary | ICD-10-CM

## 2015-11-09 DIAGNOSIS — L299 Pruritus, unspecified: Secondary | ICD-10-CM

## 2015-11-09 DIAGNOSIS — E1165 Type 2 diabetes mellitus with hyperglycemia: Secondary | ICD-10-CM | POA: Diagnosis not present

## 2015-11-09 DIAGNOSIS — E1149 Type 2 diabetes mellitus with other diabetic neurological complication: Secondary | ICD-10-CM | POA: Diagnosis not present

## 2015-11-09 DIAGNOSIS — B370 Candidal stomatitis: Secondary | ICD-10-CM | POA: Diagnosis not present

## 2015-11-09 DIAGNOSIS — Z794 Long term (current) use of insulin: Secondary | ICD-10-CM

## 2015-11-09 DIAGNOSIS — R252 Cramp and spasm: Secondary | ICD-10-CM

## 2015-11-09 LAB — GLUCOSE, CAPILLARY: Glucose-Capillary: 553 mg/dL (ref 65–99)

## 2015-11-09 MED ORDER — DIPHENHYDRAMINE HCL 25 MG PO CAPS: 25.0000 mg | ORAL_CAPSULE | Freq: Four times a day (QID) | ORAL | 2 refills | Status: DC | PRN

## 2015-11-09 MED ORDER — INSULIN DETEMIR 100 UNIT/ML FLEXPEN: 25.0000 [IU] | PEN_INJECTOR | Freq: Every day | SUBCUTANEOUS | 6 refills | Status: DC

## 2015-11-09 MED ORDER — POTASSIUM CHLORIDE 20 MEQ/15ML (10%) PO SOLN: 20.0000 meq | Freq: Every day | ORAL | 0 refills | Status: DC | PRN

## 2015-11-09 MED ORDER — HYDROCORTISONE 1 % EX CREA: TOPICAL_CREAM | CUTANEOUS | 1 refills | Status: DC

## 2015-11-09 NOTE — Assessment & Plan Note (Addendum)
Lab Results  Component Value Date   HGBA1C 10.7 08/29/2015   HGBA1C 8.7 06/13/2015   HGBA1C 8.1 02/24/2015   CBG (last 3)   Recent Labs  11/07/15 1044 11/09/15 1528  GLUCAP 545* 553*   Taking Victoza 1.8 mg daily, metformin 1000 mg BID, and levemir 18U last night.  Assessment HgbA1c goal: 7.0 Blood Sugar control: uncontrolled  Has had worsening glycemic control chronically, reflected by her increasing A1c, and now acutely with osmotic diuresis, oral candidiasis, and random BG in 500s.  Unclear why her diabetes is worsening, with no infection, dietary indiscretions, or reported medication noncompliance to precipitate increasing insulin requirement.  No signs of HHS or DKA or indications for urgent/emergent interventions.  Plan Medications: increase levemir to 25U QHS.  Continue victoza and metformin Other: RTC 1 weeks.  Check BGs twice per day and bring meter to next appointment.

## 2015-11-09 NOTE — Assessment & Plan Note (Signed)
Pruritis and excoriation of upper back with no rash evident on exam.  She does endorses scratching her upper back chronically.  Will treat pruritis symptomatically. -Low potency topical steroid w/ BID 0.1% hydrocortisone cream -Benadryl 25 mg PRN for itching -Don't scratch -RC 1 week for previously scheduled visit

## 2015-11-09 NOTE — Progress Notes (Signed)
   CC: rash on neck  HPI:  [name] is a 65 y.o. who presents with itching of her neck and abdomen since yesterday.  She was previously seen 2 days ago for lumbago, and was found to have oral candidiasis and blood glucose in 500s.  Has used nystatin swish and swallow 3x yesterday, and says her tongue feels "fiery" and still has white plaques.  She took 18U of levemir last night; she had been taking 15U and was instructed to increase to 20U.  Denies hypoglycemia and symptoms of lightheadedness, nausea, or confusion.  She initially reported 1 day of itching and rash on her neck, shoulders and abdomen, but on further questioning endorsed chronic pruritis of her upper back.  Has been using alcohol on her skin to treat the rash, has not used any lotions or OTC topical steroids.  Past Medical History:  Diagnosis Date  . Allergy   . Anemia   . Arthritis   . BREAST CANCER, HX OF 04/16/2006   Qualifier: Diagnosis of  By: Leward Quan MD, Pamala Hurry    . Cancer ([place])   . Cataract   . Chronic kidney disease   . Depression   . Diabetes mellitus without complication ([place])   . Diverticulosis   . Glaucoma   . Hyperlipidemia   . Hypertension   . Hypothyroidism   . Left arm pain 07/28/2012  . Lymphedema of upper extremity    right  . Neuromuscular disorder ([place])   . Thyroid disease     Review of Systems: Review of Systems  Constitutional: Negative for chills, diaphoresis and fever.  Eyes: Negative for blurred vision and double vision.  Respiratory: Negative for cough and shortness of breath.   Cardiovascular: Negative for chest pain and palpitations.  Gastrointestinal: Negative for abdominal pain and nausea.  Skin: Positive for itching and rash.  Neurological: Negative for loss of consciousness.   Physical Exam:  Vitals:   11/09/15 1504  BP: 127/67  Pulse: 89  Temp: 98.6 F (37 C)  TempSrc: Oral  SpO2: 100%  Weight: 138 lb 11.2 oz (62.9 kg)  Physical Exam  Constitutional: She  is oriented to person, place, and time. She appears well-developed and well-nourished. No distress.  HENT:  Soft white plaques on bilateral lateral tongue Oropharynx without leukoplakia or erythema  Cardiovascular: Normal rate and regular rhythm.   Pulmonary/Chest: Effort normal and breath sounds normal.  Neurological: She is alert and oriented to person, place, and time.  Skin:  Intermixed linear hyperpigmented scars and recent excoriation on her bilateral upper back.  No rash. Uniform, velvety hyperpigmentation of her anterior and posterior neck. No rash on abdomen.    Assessment & Plan:   See Encounters Tab for problem based charting.  Patient seen with Dr. Lynnae January

## 2015-11-09 NOTE — Assessment & Plan Note (Signed)
Soft white, superficial lingual plaques consistent with oral candidiasis are still present.  Likely precipitated by worsening hyperglycemia.  -Continue Nystatin swish and swallow 4x daily for 3 days

## 2015-11-09 NOTE — Patient Instructions (Addendum)
It was good to see you again.  For your rash, I have given you a prescription for hydrocortisone cream to spread over itchy areas twice per day.  You can also take benadryl for itching as needed.  For you diabetes, please increase your levemir to 25U every night.  Check your blood sugars at least twice per day.  For the thrush, keep using the nystatin liquid 4 times per day until you run out.  Swish it around in your mouth for 5 minutes then swallow it.  PLEASE REVIEW YOUR MEDICATION LIST AND TAKE AS PRESCRIBED.  BRING YOUR GLUCOMETER TO YOUR NEXT APPOINTMENT!!!

## 2015-11-10 ENCOUNTER — Telehealth: Payer: Self-pay | Admitting: *Deleted

## 2015-11-10 NOTE — Telephone Encounter (Signed)
I do not want to increase insulin after less than 24 hrs of the increased dose. Will she pls call Monday with CBG's?

## 2015-11-10 NOTE — Telephone Encounter (Signed)
Pt calls and states her blood sugar at 1000 this am was 343 At 1500 it was 506 She states she feels fine Increased her levemir last pm to 25u Is using the "thrush medicine" She was informed to call 911 if she becomes weak, dizzy, confused, extremely thirsty, severe h/a, blurred vision, she was agreeable Please advise Sending note to dr Roxine Caddy and attending, also to donnaP.

## 2015-11-10 NOTE — Telephone Encounter (Signed)
Tried to rtc to pt, got vmail and it is full

## 2015-11-10 NOTE — Progress Notes (Signed)
Internal Medicine Clinic Attending  I saw and evaluated the patient.  I personally confirmed the key portions of the history and exam documented by Dr. Inda Castle and I reviewed pertinent patient test results.  The assessment, diagnosis, and plan were formulated together and I agree with the documentation in the resident's note. Her posterior neck did have appearance of acanthosis nigracans. Sh had linear excoriations on upper B back but all were above an almost horizontal line at top of scapula (within arms' reach). Agree with treatment plan.

## 2015-11-13 NOTE — Telephone Encounter (Signed)
Called and phone rang and rang, will try again later

## 2015-11-13 NOTE — Progress Notes (Signed)
I saw and evaluated the patient. I personally confirmed the key portions of Dr. Rowe Pavy history and exam and reviewed pertinent patient test results. The assessment, diagnosis, and plan were formulated together and I agree with the documentation in the resident's note.

## 2015-11-13 NOTE — Telephone Encounter (Signed)
Starting a novolog correction scale is a good idea. Can you please call her and let her know. Can you send the orders to me to sign? Thank you for your help [name]

## 2015-11-13 NOTE — Telephone Encounter (Signed)
Called patient and she reports that she is taking Levemir 25 units, Victoza 1.2 daily,her appetite id decreased and she is almost done the thrush medicine. She will increase the victoza to 1.8 with her next dose per her chart notes that were discussed with her. .  CBGs 399 fasting today, other readings recently provided from her reading the values in her meter: Hi, 304, 576, 354, 495, 506, 343, 575, Hi She is eating salad with softer vegetables, protein drink x1/day, water, feels very thirsty. Has no Novolog at home and has not had it for a 'while", itches all over her body, cream is helping a little.  Told her I would send this note to Dr. Dareen Piano and we'd be back in touch with her. She requests we call her cell phone. Could consider adding correction insulin dose with Novolog 1u:50mg /dl starting at 150 mg/dl.

## 2015-11-13 NOTE — Telephone Encounter (Signed)
Can we try and cal her again today please?

## 2015-11-14 ENCOUNTER — Telehealth: Payer: Self-pay | Admitting: Dietician

## 2015-11-14 ENCOUNTER — Encounter: Payer: Self-pay | Admitting: Dietician

## 2015-11-14 DIAGNOSIS — E1149 Type 2 diabetes mellitus with other diabetic neurological complication: Secondary | ICD-10-CM

## 2015-11-14 MED ORDER — INSULIN ASPART 100 UNIT/ML FLEXPEN: PEN_INJECTOR | SUBCUTANEOUS | 11 refills | Status: DC

## 2015-11-14 NOTE — Telephone Encounter (Signed)
Thank you for your help Donna! 

## 2015-11-14 NOTE — Telephone Encounter (Signed)
Called patient, she was checking her blood sugar and reports having difficulty getting blood, the result was 260 fasting today and 489 last night. She verbalized understanding and appreciation to take the Novolog when blood sugar >150,  before meals 3x/day after checking her blood sugar and in addition to her levemir and victoza.

## 2015-11-15 ENCOUNTER — Telehealth: Payer: Self-pay | Admitting: *Deleted

## 2015-11-15 DIAGNOSIS — E1149 Type 2 diabetes mellitus with other diabetic neurological complication: Secondary | ICD-10-CM

## 2015-11-15 NOTE — Telephone Encounter (Signed)
Ms. Wuebben called from her pharmacy because when she went to pick up the Novolog they had another medicine for her. I could hear her taking to the pharmacist who said it was like benadryl. I explained that had been ordered for her on October 12th. She verbalized understanding of the directions for the benadryl and what it was for. She also agreed to call if she had questions about how to take th Noovlog insulin

## 2015-11-15 NOTE — Telephone Encounter (Signed)
Called humalog pen in at [facility], pt is there now, dr Dareen Piano please change medlist, novolog was 600 or 800 dollars, insurance will cover humalog, gave same instructions and #refills

## 2015-11-15 NOTE — Telephone Encounter (Signed)
Called and verified script with pharmacy, they have it

## 2015-11-16 MED ORDER — INSULIN LISPRO 100 UNIT/ML (KWIKPEN): PEN_INJECTOR | SUBCUTANEOUS | 11 refills | Status: DC

## 2015-11-21 ENCOUNTER — Ambulatory Visit (INDEPENDENT_AMBULATORY_CARE_PROVIDER_SITE_OTHER): Payer: Medicare Other | Admitting: Internal Medicine

## 2015-11-21 VITALS — BP 117/59 | HR 89 | Temp 98.0°F | Ht 63.0 in | Wt 145.6 lb

## 2015-11-21 DIAGNOSIS — L299 Pruritus, unspecified: Secondary | ICD-10-CM

## 2015-11-21 DIAGNOSIS — B370 Candidal stomatitis: Secondary | ICD-10-CM

## 2015-11-21 DIAGNOSIS — Z794 Long term (current) use of insulin: Secondary | ICD-10-CM

## 2015-11-21 DIAGNOSIS — E1149 Type 2 diabetes mellitus with other diabetic neurological complication: Secondary | ICD-10-CM

## 2015-11-21 DIAGNOSIS — E1165 Type 2 diabetes mellitus with hyperglycemia: Secondary | ICD-10-CM | POA: Diagnosis not present

## 2015-11-21 DIAGNOSIS — E1142 Type 2 diabetes mellitus with diabetic polyneuropathy: Secondary | ICD-10-CM

## 2015-11-21 LAB — GLUCOSE, CAPILLARY: Glucose-Capillary: 99 mg/dL (ref 65–99)

## 2015-11-21 MED ORDER — INSULIN DETEMIR 100 UNIT/ML FLEXPEN: 20.0000 [IU] | PEN_INJECTOR | Freq: Every day | SUBCUTANEOUS | 6 refills | Status: DC

## 2015-11-21 MED ORDER — INSULIN LISPRO 100 UNIT/ML (KWIKPEN): PEN_INJECTOR | SUBCUTANEOUS | 11 refills | Status: DC

## 2015-11-21 NOTE — Assessment & Plan Note (Signed)
Pruritis and excoriation of upper back with no rash evident on exam.  She does endorses scratching her upper back chronically.  Symptoms improved with topical steroids and benadryl. -Low potency topical steroid w/ BID 0.1% hydrocortisone cream -Benadryl 25 mg PRN for itching -Don't scratch

## 2015-11-21 NOTE — Assessment & Plan Note (Signed)
Resolved with Nystatin swish and swallow.

## 2015-11-21 NOTE — Progress Notes (Signed)
   CC: blood sugar control and insulin management  HPI:  [name] is a 65 y.o. woman with history of DM2, HTN, and low back pain who presents for follow-up of diabetes and itchy rash.  She was seen in clinic twice about 2 weeks ago with oral candidiasis and polyuria, and found to be hyperglycemic .  Given Nystatin swish and swallow, Lantus increased from 15U to 25U QHS.  Shortly thereafter, she talked to [facility] on the phone and reported continuing high CBGs.  She had not had any short acting insulin, and Dr Dareen Piano prescribed sliding scale Humalog.  However, Ms [name] did not know how much to take and hasn't been checking BGs with every meal, and starting taking 15U of Humalog TID in addition to 25U of Levemir QHS.  The first time she started on this regimen, she woke up in the night feeling jittery, found her BG to be 55, and symptoms resolved with a snack.  Thereafter she decreased Humalog to TID.  Has still been seeing CBGs in 300s during the day.  Her oral candidiasis and itchy back have improved with Nystatin swish and swallow, which she used up, and topical hydrocortisone and Benadryl.  Past Medical History:  Diagnosis Date  . Allergy   . Anemia   . Arthritis   . BREAST CANCER, HX OF 04/16/2006   Qualifier: Diagnosis of  By: Leward Quan MD, Pamala Hurry    . Cancer ([place])   . Cataract   . Chronic kidney disease   . Depression   . Diabetes mellitus without complication ([place])   . Diverticulosis   . Glaucoma   . Hyperlipidemia   . Hypertension   . Hypothyroidism   . Left arm pain 07/28/2012  . Lymphedema of upper extremity    right  . Neuromuscular disorder ([place])   . Thyroid disease     Review of Systems:   Review of Systems  Constitutional: Negative for chills and fever.  HENT: Negative for sore throat.   Eyes: Negative for blurred vision.  Genitourinary: Negative for frequency.  Skin: Positive for itching and rash.     Physical Exam:  Vitals:   11/21/15 1001    BP: (!) 117/59  Pulse: 89  Temp: 98 F (36.7 C)  TempSrc: Oral  SpO2: 100%  Weight: 145 lb 9.6 oz (66 kg)  Height: 5\' 3"  (1.6 m)   Physical Exam  Constitutional: She appears well-developed and well-nourished. No distress.  HENT:  Mouth/Throat: Oropharynx is clear and moist. No oropharyngeal exudate.  Cardiovascular: Normal rate and regular rhythm.   Pulmonary/Chest: Effort normal and breath sounds normal.  Skin: Skin is warm and dry.  Psychiatric: She has a normal mood and affect. Her behavior is normal.    Assessment & Plan:   See Encounters Tab for problem based charting.  Patient seen with Dr. Angelia Mould

## 2015-11-21 NOTE — Patient Instructions (Addendum)
For your diabetes:  Take 20U Levemir at night  Take 5U of Humalog before meals 3 times per day.  Continue taking metformin 1000 mg twice per day.  Continue injecting Victoza 0.3 mL (1.8 mg) once per day.  We will schedule you an appointment with Barry Brunner to continue talking about your diabetes and managing insulin.  It looks like the yeast infection in your mouth is better.  You can stop using the Nystatin mouthwash.  Please call the clinic if you have any questions about your insulin.  We will see you again in 1 month, and you can call for a sooner appointment if anything changes.

## 2015-11-21 NOTE — Assessment & Plan Note (Signed)
Lab Results  Component Value Date   HGBA1C 10.7 08/29/2015   HGBA1C 8.7 06/13/2015   HGBA1C 8.1 02/24/2015   CBG (last 3)   Recent Labs  11/21/15 1018  GLUCAP 99   Medications: Victoza 1.8 mg daily, metformin 1000 mg BID Insulin: Levemir 25U QHS, Humalog 10U TID  She seems confused about her insulin regimen, and does not recall being instructed on how to use Humalog or to check BGs with every meal.  She had one symptomatic hypoglycemic episode which resolved with a snack.  I do not think she can manage a sliding scale at this time, and requires a simplified insulin regimen and further education  Assessment HgbA1c goal: 7.0 Blood Sugar control: uncontrolled  Has had worsening glycemic control chronically, reflected by her increasing A1c, and now acutely with osmotic diuresis, oral candidiasis, and random BG in 500s.  Unclear why her diabetes is worsening, with no infection, dietary indiscretions, or reported medication noncompliance to precipitate increasing insulin requirement.  Plan Medications: Continue victoza 1.8 mg daily and metformin 1000 mg BID Insulin: Levemir 20U QHS, Humalog 5U TID Other:  -Diabetes education-RTC 1 month.  Check BGs twice per day and bring meter to next appointment.

## 2015-11-23 ENCOUNTER — Encounter (INDEPENDENT_AMBULATORY_CARE_PROVIDER_SITE_OTHER): Payer: Medicare Other | Admitting: Ophthalmology

## 2015-11-24 ENCOUNTER — Encounter (INDEPENDENT_AMBULATORY_CARE_PROVIDER_SITE_OTHER): Payer: Medicare Other | Admitting: Ophthalmology

## 2015-11-24 DIAGNOSIS — I10 Essential (primary) hypertension: Secondary | ICD-10-CM | POA: Diagnosis not present

## 2015-11-24 DIAGNOSIS — E113312 Type 2 diabetes mellitus with moderate nonproliferative diabetic retinopathy with macular edema, left eye: Secondary | ICD-10-CM

## 2015-11-24 DIAGNOSIS — H35033 Hypertensive retinopathy, bilateral: Secondary | ICD-10-CM

## 2015-11-24 DIAGNOSIS — E11311 Type 2 diabetes mellitus with unspecified diabetic retinopathy with macular edema: Secondary | ICD-10-CM

## 2015-11-24 DIAGNOSIS — E113391 Type 2 diabetes mellitus with moderate nonproliferative diabetic retinopathy without macular edema, right eye: Secondary | ICD-10-CM | POA: Diagnosis not present

## 2015-11-24 DIAGNOSIS — H43813 Vitreous degeneration, bilateral: Secondary | ICD-10-CM | POA: Diagnosis not present

## 2015-11-24 NOTE — Progress Notes (Signed)
Internal Medicine Clinic Attending  I saw and evaluated the patient.  I personally confirmed the key portions of the history and exam documented by Dr. O'Sullivan and I reviewed pertinent patient test results.  The assessment, diagnosis, and plan were formulated together and I agree with the documentation in the resident's note.   

## 2015-11-27 ENCOUNTER — Telehealth: Payer: Self-pay | Admitting: Dietician

## 2015-11-27 DIAGNOSIS — Z1379 Encounter for other screening for genetic and chromosomal anomalies: Secondary | ICD-10-CM | POA: Insufficient documentation

## 2015-11-27 NOTE — Telephone Encounter (Signed)
Her CBG was 186 this am, she wants to come in to have me fix her diabetes meter ranges. Told her she can come in at her convenience.

## 2015-11-27 NOTE — Progress Notes (Signed)
GENETIC TEST RESULT  HPI: Ms. Friley was previously seen in the [place] clinic due to a personal and family history of early-onset breast cancer and concerns regarding a hereditary predisposition to breast cancer. Ms. Uffelman previously had negative BRCA1/2 testing in 1999 and returned to [facility] for updated, more comprehensive genetic testing. Please refer to our prior cancer genetics clinic note from September 14, 2015 for more information regarding Ms. Lemieux's medical, social and family histories, and our assessment and recommendations, at the time. Ms. Forstner recent genetic test results were disclosed to her, as were recommendations warranted by these results. These results and recommendations are discussed in more detail below.  GENETIC TEST RESULTS: At the time of Ms. Winfield visit on 09/14/15, we recommended she pursue genetic testing of the 20-gene Breast/Ovarian Cancer Panel with MSH2 Exons 1-7 Inversion Analysis through [facility]. The Breast/Ovarian Cancer Panel offered by [facility] Hope Pigeon, MD) includes sequencing and deletion/duplication analysis for the following 19 genes:  ATM, BARD1, BRCA1, BRCA2, BRIP1, CDH1, CHEK2, FANCC, MLH1, MSH2, MSH6, NBN, PALB2, PMS2, PTEN, RAD51C, RAD51D, TP53, and XRCC2.  This panel also includes deletion/duplication analysis (without sequencing) for one gene, EPCAM.  Those results are now back, the report date for which is September 27, 2015.  Genetic testing was normal, and did not reveal a deleterious mutation in these genes. Additionally, no variants of uncertain significance (VUSes) were found.  The test report will be scanned into EPIC and will be located under the Results Review tab in the Pathology>Molecular Pathology section.   We discussed with Ms. Fakhouri that since the current genetic testing is not perfect, it is possible there may be a gene mutation in one of these genes that current testing cannot detect, but  that chance is small. We also discussed, that it is possible that another gene that has not yet been discovered, or that we have not yet tested, is responsible for the cancer diagnoses in the family, and it is, therefore, important to remain in touch with cancer genetics in the future so that we can continue to offer Ms. Kearse the most up-to-date genetic testing.   CANCER SCREENING RECOMMENDATIONS: While we still do not have an explanation for the personal and family history of cancer, this result may be reassuring and indicate that Ms. Maranan likely does not have an increased risk for a future cancer due to a mutation in one of these genes. This normal test also suggests that Ms. Woodford's cancer was most likely not due to an inherited predisposition associated with one of these genes.  Most cancers happen by chance and this negative test suggests that her cancer falls into this category.  We, therefore, recommended she continue to follow the cancer management and screening guidelines provided by her oncology and primary healthcare providers.   RECOMMENDATIONS FOR FAMILY MEMBERS: Women in this family might be at some increased risk of developing cancer, over the general population risk, simply due to the family history of cancer. We recommended women in this family have a yearly mammogram beginning at age 71, or 43 years younger than the earliest onset of cancer, an annual clinical breast exam, and perform monthly breast self-exams.  Ms. Weddington granddaughters can begin mammogram screening at the age of 16.  Women in this family should also have a gynecological exam as recommended by their primary provider. All family members should have a colonoscopy by age 67.  Ms. Yanik daughter, Denman George, who was diagnosed with breast cancer  at 70, has also had negative comprehensive genetic testing. This may be further reassuring to [place].  Ms. Muralles should call and update [place] if anyone else in the family is diagnosed with  cancer.   FOLLOW-UP: Lastly, we discussed with Ms. Kayes that cancer genetics is a rapidly advancing field and it is possible that new genetic tests will be appropriate for her and/or her family members in the future. We encouraged her to remain in contact with cancer genetics on an annual basis so we can update her personal and family histories and let her know of advances in cancer genetics that may benefit this family.   Our contact number was provided. Ms. Valli questions were answered to her satisfaction, and she knows she is welcome to call us at anytime with additional questions or concerns.   Jeanine Luz, MS, [facility] Certified Genetic Counselor [email or web address] Phone: [phone]

## 2015-11-27 NOTE — Telephone Encounter (Signed)
Called to follow up on blood sugars: she was just waking up and had not checked her blood sugar today yet. She says that her sugar was 85 last night before she went to bed, after eating dinner and taking the 5 units of Humalog. She did not eat a snack and took her 20 units levemir before going to sleep. She agreed to check her blood sugar this am and call with the value

## 2015-12-01 ENCOUNTER — Ambulatory Visit (INDEPENDENT_AMBULATORY_CARE_PROVIDER_SITE_OTHER): Payer: Medicare Other | Admitting: Internal Medicine

## 2015-12-01 ENCOUNTER — Encounter: Payer: Self-pay | Admitting: Internal Medicine

## 2015-12-01 VITALS — BP 126/82 | HR 95 | Ht 63.0 in | Wt 151.0 lb

## 2015-12-01 DIAGNOSIS — E1149 Type 2 diabetes mellitus with other diabetic neurological complication: Secondary | ICD-10-CM | POA: Diagnosis not present

## 2015-12-01 DIAGNOSIS — E1142 Type 2 diabetes mellitus with diabetic polyneuropathy: Secondary | ICD-10-CM | POA: Diagnosis not present

## 2015-12-01 DIAGNOSIS — Z794 Long term (current) use of insulin: Secondary | ICD-10-CM | POA: Diagnosis not present

## 2015-12-01 LAB — POCT GLYCOSYLATED HEMOGLOBIN (HGB A1C): Hemoglobin A1C: 11.7

## 2015-12-01 MED ORDER — INSULIN DETEMIR 100 UNIT/ML FLEXPEN
15.0000 [IU] | PEN_INJECTOR | Freq: Every day | SUBCUTANEOUS | 6 refills | Status: DC
Start: 2015-12-01 — End: 2016-09-11

## 2015-12-01 MED ORDER — INSULIN LISPRO 100 UNIT/ML (KWIKPEN): PEN_INJECTOR | SUBCUTANEOUS | 11 refills | Status: DC

## 2015-12-01 NOTE — Patient Instructions (Addendum)
Please decrease Levemir to 15 units at bedtime.  Please change Humalog: - 3 units with a regular meal - 4 units with a larger meal  Please continue: - Metformin 1000 mg 2x a day, with meals - Victoza 1.8 mg but move this to before b'fast  Please let me know if the sugars are consistently <80 or >200.  Please return in 1.5 months with your sugar log.   PATIENT INSTRUCTIONS FOR TYPE 2 DIABETES:  **Please join MyChart!** - see attached instructions about how to join if you have not done so already.  DIET AND EXERCISE Diet and exercise is an important part of diabetic treatment.  We recommended aerobic exercise in the form of brisk walking (working between 40-60% of maximal aerobic capacity, similar to brisk walking) for 150 minutes per week (such as 30 minutes five days per week) along with 3 times per week performing 'resistance' training (using various gauge rubber tubes with handles) 5-10 exercises involving the major muscle groups (upper body, lower body and core) performing 10-15 repetitions (or near fatigue) each exercise. Start at half the above goal but build slowly to reach the above goals. If limited by weight, joint pain, or disability, we recommend daily walking in a swimming pool with water up to waist to reduce pressure from joints while allow for adequate exercise.    BLOOD GLUCOSES Monitoring your blood glucoses is important for continued management of your diabetes. Please check your blood glucoses 2-4 times a day: fasting, before meals and at bedtime (you can rotate these measurements - e.g. one day check before the 3 meals, the next day check before 2 of the meals and before bedtime, etc.).   HYPOGLYCEMIA (low blood sugar) Hypoglycemia is usually a reaction to not eating, exercising, or taking too much insulin/ other diabetes drugs.  Symptoms include tremors, sweating, hunger, confusion, headache, etc. Treat IMMEDIATELY with 15 grams of Carbs: . 4 glucose tablets .  cup  regular juice/soda . 2 tablespoons raisins . 4 teaspoons sugar . 1 tablespoon honey Recheck blood glucose in 15 mins and repeat above if still symptomatic/blood glucose <100.  RECOMMENDATIONS TO REDUCE YOUR RISK OF DIABETIC COMPLICATIONS: * Take your prescribed MEDICATION(S) * Follow a DIABETIC diet: Complex carbs, fiber rich foods, (monounsaturated and polyunsaturated) fats * AVOID saturated/trans fats, high fat foods, >2,300 mg salt per day. * EXERCISE at least 5 times a week for 30 minutes or preferably daily.  * DO NOT SMOKE OR DRINK more than 1 drink a day. * Check your FEET every day. Do not wear tightfitting shoes. Contact us if you develop an ulcer * See your EYE doctor once a year or more if needed * Get a FLU shot once a year * Get a PNEUMONIA vaccine once before and once after age 64 years  GOALS:  * Your Hemoglobin A1c of <7%  * fasting sugars need to be <130 * after meals sugars need to be <180 (2h after you start eating) * Your Systolic BP should be [id number] or lower  * Your Diastolic BP should be 80 or lower  * Your HDL (Good Cholesterol) should be 40 or higher  * Your LDL (Bad Cholesterol) should be 100 or lower. * Your Triglycerides should be 150 or lower  * Your Urine microalbumin (kidney function) should be <30 * Your Body Mass Index should be 25 or lower    Please consider the following ways to cut down carbs and fat and increase fiber and micronutrients in  your diet: - substitute whole grain for white bread or pasta - substitute brown rice for white rice - substitute 90-calorie flat bread pieces for slices of bread when possible - substitute sweet potatoes or yams for white potatoes - substitute humus for margarine - substitute tofu for cheese when possible - substitute almond or rice milk for regular milk (would not drink soy milk daily due to concern for soy estrogen influence on breast cancer risk) - substitute dark chocolate for other sweets when possible -  substitute water - can add lemon or orange slices for taste - for diet sodas (artificial sweeteners will trick your body that you can eat sweets without getting calories and will lead you to overeating and weight gain in the long run) - do not skip breakfast or other meals (this will slow down the metabolism and will result in more weight gain over time)  - can try smoothies made from fruit and almond/rice milk in am instead of regular breakfast - can also try old-fashioned (not instant) oatmeal made with almond/rice milk in am - order the dressing on the side when eating salad at a restaurant (pour less than half of the dressing on the salad) - eat as little meat as possible - can try juicing, but should not forget that juicing will get rid of the fiber, so would alternate with eating raw veg./fruits or drinking smoothies - use as little oil as possible, even when using olive oil - can dress a salad with a mix of balsamic vinegar and lemon juice, for e.g. - use agave nectar, stevia sugar, or regular sugar rather than artificial sweateners - steam or broil/roast veggies  - snack on veggies/fruit/nuts (unsalted, preferably) when possible, rather than processed foods - reduce or eliminate aspartame in diet (it is in diet sodas, chewing gum, etc) Read the labels!  Try to read Dr. Janene Harvey book: "Program for Reversing Diabetes" for other ideas for healthy eating.

## 2015-12-01 NOTE — Progress Notes (Addendum)
Patient ID: [name], female   DOB: 1950-10-06, 65 y.o.   MRN: [id number]   HPI: [name] is a 65 y.o.-year-old female, referred by Dr. Asencion Partridge, for management of DM2, dx in 1991-1992, insulin-dependent since 2000s, uncontrolled, with complications (DR, PN).  Last hemoglobin A1c was: Lab Results  Component Value Date   HGBA1C 10.7 08/29/2015   HGBA1C 8.7 06/13/2015   HGBA1C 8.1 02/24/2015   Pt is on a regimen of: - Metformin 1000 mg 2x a day, with meals - Victoza 1.8 mg in evening (!) - Levemir 20 units at bedtime (decreased on 11/21/2015) - Humalog 5 units 3x a day, before meals (decreased on 11/21/2015)  Pt checks her sugars 1-3x a day and they are MUCH lower in the last week >> many lows (will scan meter download). Previously: 300s-HI, before 11/20/2015 - am: 56-99, 186 - 2h after b'fast: n/c - before lunch: 53-138 (low when skips b'fast) - 2h after lunch: 126, 214 - before dinner: 56-118 - 2h after dinner: 413 - bedtime: 164 - nighttime: n/c No lows. Lowest sugar was 53; she has hypoglycemia awareness at 80.  Highest sugar was HI.  Glucometer: One Touch Verio  Pt's meals are: - Breakfast: cereals + milk + banana; scrambled egg + toast; or skips - Lunch: may skip; fast food - Dinner: baked chicken + veggies + starch - Snacks: icecream - stopped icecream 1 week ago  - no CKD, last BUN/creatinine:  Lab Results  Component Value Date   BUN 15 10/18/2015   BUN 19 10/03/2015   CREATININE 0.96 10/18/2015   CREATININE 0.99 10/03/2015   - last set of lipids: Lab Results  Component Value Date   CHOL 190 10/18/2015   HDL 53 10/18/2015   LDLCALC 118 (H) 10/18/2015   TRIG 93 10/18/2015   CHOLHDL 3.6 10/18/2015  On Pravastatin. - last eye exam was in 03/30/2015. + DR.  - + numbness and tingling in her feet.  Pt has FH of DM in both daughters. Also, in father.  ROS: Constitutional: + Fatigue, decreased appetite, weight loss, feeling hot and cold, hot  flashes, poor sleep Eyes: + blurry vision, no xerophthalmia ENT: no sore throat, no nodules palpated in throat, no dysphagia/odynophagia, no hoarseness Cardiovascular: no CP/SOB/palpitations/leg swelling Respiratory: no cough/SOB Gastrointestinal: no N/V/D/+ C Musculoskeletal: + muscle aches/no joint aches Skin: + rash, itching Neurological: no tremors/numbness/tingling/dizziness Psychiatric: + depression/no anxiety  Past Medical History:  Diagnosis Date  . Allergy   . Anemia   . Arthritis   . BREAST CANCER, HX OF 04/16/2006   Qualifier: Diagnosis of  By: Leward Quan MD, Pamala Hurry    . Cancer ([place])   . Cataract   . Chronic kidney disease   . Depression   . Diabetes mellitus without complication ([place])   . Diverticulosis   . Glaucoma   . Hyperlipidemia   . Hypertension   . Hypothyroidism   . Left arm pain 07/28/2012  . Lymphedema of upper extremity    right  . Neuromuscular disorder ([place])   . Thyroid disease    Past Surgical History:  Procedure Laterality Date  . ABDOMINAL HYSTERECTOMY    . BREAST SURGERY    . EYE SURGERY    . FINGER SURGERY Left 2016   thumb infection (3 times so far)  . GANGLION CYST EXCISION Right 2016   right wrist  . I&D EXTREMITY Left 09/09/2014   Procedure: IRRIGATION AND DEBRIDEMENT LEFT THUMB DISTAL PHALANX;  Surgeon: Daryll Brod, MD;  Location: [facility];  Service: Orthopedics;  Laterality: Left;  [name] MASTECTOMY Right 1999  . TUBAL LIGATION     Social History   Social History  . Marital status: Widow     Spouse name: N/A  . Number of children: 4   Occupational History  . Retired   Social History Main Topics  . Smoking status: Smoker  . Smokeless tobacco: Never Used  . Alcohol use yes  . Drug use: yes   Current Outpatient Prescriptions on File Prior to Visit  Medication Sig Dispense Refill  . amLODipine (NORVASC) 10 MG tablet Take 1 tablet (10 mg total) by mouth daily. 30 tablet 11  . aspirin EC 81 MG tablet Take 81 mg by  mouth at bedtime.    . Blood Glucose Monitoring Suppl (ONETOUCH VERIO FLEX SYSTEM) w/Device KIT 1 application by Does not apply route 3 (three) times daily. E11.49, patient is insulin-requiring, needs meter replacement (broken meter) 1 kit 0  . Cholecalciferol (VITAMIN D-3) 1000 UNITS CAPS Take 2,000 Units by mouth daily.     . cycloSPORINE (RESTASIS) 0.05 % ophthalmic emulsion Place 1 drop into both eyes 2 (two) times daily.    . diphenhydrAMINE (BENADRYL) 25 mg capsule Take 1 capsule (25 mg total) by mouth every 6 (six) hours as needed for itching. 24 capsule 2  . glucose 4 GM chewable tablet Chew 4 tablets (16 g total) by mouth as needed for low blood sugar. 50 tablet 12  . glucose blood test strip Use one touch verio strips  2 times  daily to check blood sugar. diag code E11.40. Insulin dependent 200 each 3  . hydrochlorothiazide (MICROZIDE) 12.5 MG capsule TAKE 1 CAPSULE BY MOUTH EVERY DAY 90 capsule 3  . hydrocortisone cream 1 % Apply to affected area 2 times daily 30 g 1  . Insulin Detemir (LEVEMIR FLEXTOUCH) 100 UNIT/ML Pen Inject 20 Units into the skin daily at 10 pm. 15 mL 6  . insulin lispro (HUMALOG KWIKPEN) 100 UNIT/ML KiwkPen Take 5U three times a day before meals. 15 mL 11  . Insulin Pen Needle (BD PEN NEEDLE NANO U/F) 32G X 4 MM MISC Use to give Levemir insulin at bedtime. Code E11.40. 100 each 3  . Insulin Syringe-Needle U-100 (B-D INS SYR ULTRAFINE .5CC/30G) 30G X 1/2" 0.5 ML MISC To use 4 times daily for injecting insulin. diag code 250.42. Insulin dependent 150 each 6  . Lancets (ONETOUCH ULTRASOFT) lancets Use to check blood sugar 2 time daily. diag code E11.40. Insulin dependent 180 each 3  . levothyroxine (SYNTHROID, LEVOTHROID) 75 MCG tablet TAKE 1 TABLET BY MOUTH DAILY BEFORE BREAKFAST 90 tablet 3  . Liraglutide 18 MG/3ML SOPN Inject 0.3 mLs (1.8 mg total) into the skin daily. 6 mL 3  . metFORMIN (GLUCOPHAGE) 1000 MG tablet Take 1 tablet (1,000 mg total) by mouth 2 (two)  times daily with a meal. 60 tablet 6  . Polyvinyl Alcohol-Povidone (REFRESH OP) Place 1 drop into both eyes 3 (three) times daily.    . potassium chloride 20 MEQ/15ML (10%) SOLN Take 15 mLs (20 mEq total) by mouth daily as needed (for muscle cramps). 240 mL 0  . pravastatin (PRAVACHOL) 40 MG tablet TAKE 1 TABLET BY MOUTH DAILY 90 tablet 3  . pregabalin (LYRICA) 50 MG capsule Take 1 capsule (50 mg total) by mouth 3 (three) times daily. 90 capsule 2  . Q-NOL 325 MG tablet TAKE 1 TABLET(325 MG) BY MOUTH EVERY 8 HOURS AS  NEEDED FOR PAIN 90 tablet 0  . venlafaxine XR (EFFEXOR-XR) 150 MG 24 hr capsule TAKE 1 CAPSULE(150 MG) BY MOUTH DAILY WITH BREAKFAST 90 capsule 1  . zoster vaccine live, PF, (ZOSTAVAX) 94174 UNT/0.65ML injection Inject 19,400 Units into the skin once. 1 each 0   No current facility-administered medications on file prior to visit.    Allergies  Allergen Reactions  . Gabapentin Other (See Comments)    Dizziness from 300 mg, tolerates 100 mg  . Penicillins Rash  . Sulfonamide Derivatives Rash   Family History  Problem Relation Age of Onset  . Heart disease Father   . Diabetes Father   . Stroke Sister   . Anuerysm Sister 75    brain; maternal half-sister  . Heart disease Brother   . Anuerysm Brother 69    aortic; maternal half-brother  . Breast cancer Daughter 91    negative genetic testing in 2015  . Heart attack Daughter     60-47  . Diabetes Paternal Grandmother   . Stroke Paternal Grandfather   . Anuerysm Brother     NOS type; full brother  . Fibroids Daughter     s/p hysterectomy at 49y  . Brain cancer Maternal Uncle     dx. older than 60; NOS type  . Brain cancer Cousin     maternal 1st cousin; d. early 66s; NOS type  . Deafness Paternal Uncle     prelingual   PE: BP 126/82   Pulse 95   Ht 5' 3"  (1.6 m)   Wt 151 lb (68.5 kg)   SpO2 97%   BMI 26.75 kg/m  Wt Readings from Last 3 Encounters:  12/01/15 151 lb (68.5 kg)  11/21/15 145 lb 9.6 oz (66 kg)   11/09/15 138 lb 11.2 oz (62.9 kg)   Constitutional: Normal weight, in NAD Eyes: PERRLA, EOMI, no exophthalmos ENT: moist mucous membranes, no thyromegaly, no cervical lymphadenopathy Cardiovascular: RRR, No MRG Respiratory: CTA B Gastrointestinal: abdomen soft, NT, ND, BS+ Musculoskeletal: no deformities, strength intact in all 4 Skin: moist, warm, no rashes Neurological: no tremor with outstretched hands, DTR normal in all 4  ASSESSMENT: 1. DM2, insulin-dependent, uncontrolled, with complications - DR - PN  PLAN:  1. Patient with long-standing, uncontrolled diabetes, on injectable and oral medication regimen. Sugars have been very uncontrolled until approximately 2 weeks ago, when PCP adjusted her insulin doses. Since then, the sugars decreased considerably, and her sugars are frequently in the 50s.  HbA1c today is high, 11.7%, however, in the last week, her sugars are much lower, therefore, will decrease the Levemir and Humalog doses and move Victoza in a.m. We may be able to even stop the Humalog if she continues to improve her sugars. I believe that another contributor to her drastically improved blood sugars is the fact that she stopped ice cream. I strongly encouraged her to continue to stay off ice cream. - I suggested to:  Patient Instructions  Please decrease Levemir to 15 units at bedtime.  Please change Humalog: - 3 units with a regular meal - 4 units with a larger meal  Please continue: - Metformin 1000 mg 2x a day, with meals - Victoza 1.8 mg but move this to before b'fast  Please let me know if the sugars are consistently <80 or >200.  Please return in 1.5 months with your sugar log.   - Strongly advised her to start checking sugars at different times of the day - check 3 times a  day, rotating checks - given sugar log and advised how to fill it and to bring it at next appt  - given foot care handout and explained the principles  - given instructions for  hypoglycemia management "15-15 rule"  - advised for yearly eye exams >> she is up-to-date - Return to clinic in 1.5 mo with sugar log   Philemon Kingdom, MD PhD [facility] Endocrinology

## 2015-12-25 ENCOUNTER — Ambulatory Visit (INDEPENDENT_AMBULATORY_CARE_PROVIDER_SITE_OTHER): Payer: Medicare Other | Admitting: Dietician

## 2015-12-25 ENCOUNTER — Ambulatory Visit (INDEPENDENT_AMBULATORY_CARE_PROVIDER_SITE_OTHER): Payer: Medicare Other | Admitting: Internal Medicine

## 2015-12-25 ENCOUNTER — Encounter: Payer: Self-pay | Admitting: Internal Medicine

## 2015-12-25 ENCOUNTER — Other Ambulatory Visit: Payer: Self-pay | Admitting: Internal Medicine

## 2015-12-25 VITALS — BP 143/56 | HR 97 | Temp 98.1°F | Ht 63.0 in | Wt 137.4 lb

## 2015-12-25 DIAGNOSIS — E1165 Type 2 diabetes mellitus with hyperglycemia: Secondary | ICD-10-CM | POA: Diagnosis not present

## 2015-12-25 DIAGNOSIS — Z713 Dietary counseling and surveillance: Secondary | ICD-10-CM

## 2015-12-25 DIAGNOSIS — Z794 Long term (current) use of insulin: Secondary | ICD-10-CM

## 2015-12-25 DIAGNOSIS — E1149 Type 2 diabetes mellitus with other diabetic neurological complication: Secondary | ICD-10-CM | POA: Diagnosis not present

## 2015-12-25 DIAGNOSIS — Z0000 Encounter for general adult medical examination without abnormal findings: Secondary | ICD-10-CM

## 2015-12-25 LAB — GLUCOSE, CAPILLARY: Glucose-Capillary: 98 mg/dL (ref 65–99)

## 2015-12-25 NOTE — Patient Instructions (Signed)
[name],  It was a pleasure meeting you today. Please continue to check your blood sugars daily before meals. Please keep your appointment with your endocrinologist on December 28th.  I will order a DEXA scan today and someone will call you to make an appointment for the imaging.

## 2015-12-25 NOTE — Patient Instructions (Addendum)
Consider calling Dr. Arman Filter office to tell her about your low blood sugars- consider a HUMALOG dose for a small meal.   Try taking pictures of your meals and how much HUMALOG INSULIN you are going to take with it to keep a record to show Dr. Cruzita Lederer.   We can follow up at your next doctor visit or one to two months

## 2015-12-25 NOTE — Progress Notes (Signed)
  Medical Nutrition Therapy:  Appt start time: 1100 end time: 1145   Assessment:  Primary concerns today: blood sugar control. [name] lost control of her diabetes when mealtime insulin was stopped and started victoza. She  Is now on max dose victoza and reports poor appetite.  She has regained control and has two lows in past few days.  She is not recording her food or insulin intake because she doesn't;t like to write. She has not been active lately or had an alcohol.  She does not recall accurately what foods will increase blood sugar. She continues to report memory problems. She prefers to continue with MNT here for here diabetes.   WEIGHT: 137#, IBW: 115# -127#, BMI-24, encouraged her that this is a healthy weight, but she stated that her desired weight is 140-150# BLOOD SUGARS: A1C 11.7, 30 days average 222 with trend to range of 100-200 in past month, symptomatic lows to 46 and 51 this past weekend.  MEDICATIONS: takes levemir to 15 units at night, Victoza 1.8 mg qam,  metformin twice daily, Humalog 3 units ~ 1-2x/day SLEEP:no problems reported DIABETES DISTRESS: not measured today DIETARY INTAKE: 24-hr recall- ate little yesterday, mac and cheese with greens about 8 PM. Water to drink. She is trying to eat as healthy as possible   Estimated daily energy needs: ~ 1400-1600 calories ~200 g carbohydrates   Progress Towards Goal(s):  In progress.   Nutritional Diagnosis:  [place]-2.2 Altered nutrition-related laboratory and [place]-2.4 Predicted food-medication interaction As related to lack of adequate blood sugar control worsened , still not at goal 7%  As evidenced by A1C> goal .    Intervention:  Nutrition education on foods and portions that increase blood sugar with emphasis on eating at least 1-2 servings each meal that she takes Humalog. suggested and she demonstrated using her smartphone camera for recording food and meal time insulin intake. meter download reviewed and corrected.   Coordination of care- consider adding 2 units Humalog for small meals to current meal time scale.  Handouts given during visit include: picture poster of foods and portion sizes of foods that increase blood sugar. AVS Monitoring/Evaluation:  Dietary intake, exercise, meter, and body weight in 4 week(s)

## 2015-12-25 NOTE — Progress Notes (Signed)
   CC: Follow up on uncontrolled type 2 diabetes  HPI:  [name] is a 65 y.o. female that presents to the internal medicine clinic for follow-up on uncontrolled type 2 diabetes.  Patient states she was seen in the clinic last month for diabetes follow-up and was referred to endocrinology.  She saw her endocrinologist Dr. Cruzita Lederer on 12/01/15.  Patient reports she is currently taking Victoza 1.8 mg daily and metformin 1000 mg twice a day. She is also on Levemir 15 units at bedtime and Humalog 3-4 units 3 times a day.  She states that she has had a few episodes of hypoglycemia and drinks water with sugar or juice when she feels that her blood sugar is low.  She reports these episodes resolve quickly. She denies any loss of consciousness or falls.     Past Medical History:  Diagnosis Date  . Allergy   . Anemia   . Arthritis   . BREAST CANCER, HX OF 04/16/2006   Qualifier: Diagnosis of  By: Leward Quan MD, Pamala Hurry    . Cancer ([place])   . Cataract   . Chronic kidney disease   . Depression   . Diabetes mellitus without complication ([place])   . Diverticulosis   . Glaucoma   . Hyperlipidemia   . Hypertension   . Hypothyroidism   . Left arm pain 07/28/2012  . Lymphedema of upper extremity    right  . Neuromuscular disorder ([place])   . Thyroid disease     Review of Systems:  Review of Systems  Respiratory: Negative for shortness of breath.   Cardiovascular: Negative for chest pain, palpitations and leg swelling.     Physical Exam:  Vitals:   12/25/15 0957  BP: (!) 143/56  Pulse: 97  Temp: 98.1 F (36.7 C)  TempSrc: Oral  SpO2: 100%  Weight: 137 lb 6.4 oz (62.3 kg)  Height: 5\' 3"  (1.6 m)   Physical Exam  Constitutional: She is well-developed, well-nourished, and in no distress.  Cardiovascular: Normal rate, regular rhythm and normal heart sounds.  Exam reveals no gallop and no friction rub.   No murmur heard. Pulmonary/Chest: Effort normal and breath sounds normal. No  respiratory distress. She has no wheezes. She has no rales.  Musculoskeletal: She exhibits no edema.     Assessment & Plan:   See encounters tab for problem based medical decision making.   Patient seen with Dr. Angelia Mould

## 2015-12-26 NOTE — Assessment & Plan Note (Signed)
Assessment: Preventative healthcare Patient is 29 and was offered a DEXA scan. She stated she would like to have it done.  Plan -DEXA scan

## 2015-12-26 NOTE — Telephone Encounter (Signed)
Received refill request from pharmacy for gabapentin-medication no longer on medication list.  Pt was contacted regarding refill-she did not request it.  Gabapentin is also listed on pt's allergy list (intolerance) as it causes dizziness.  Will refuse refill.Regenia Skeeter, Darlene Cassady11/28/20178:50 AM

## 2015-12-26 NOTE — Assessment & Plan Note (Addendum)
Assessment: uncontrolled type II diabetes  Patient was seen in the clinic on 11-21-15 for blood sugar control and insulin management and referred to endocrinology.  Patient recently saw Dr. Renne Crigler on 12/01/15 and is scheduled to see her again at the end of December. Patient is currently taking Victoza 1.8 mg daily and metformin 1000 mg twice a day. She is also on Levemir 15 units at bedtime and Humalog 3-4 units 3 times a day.  Today her CBG was 98.  Patient had her glucometer today and continues to have labile blood glucose levels. At this time no changes to her diabetes medications will be made since she just started a new regimen and will be seeing Dr. Renne Crigler at the end of Decemeber.  Her last A1c was on 12/01/15 and was 11.7.  Plan: -Meet with our nutritionist [name] at today's visit to help with diabetes management -Advised to keep her endocrinology appointment scheduled for the December 28th

## 2015-12-27 NOTE — Progress Notes (Signed)
Internal Medicine Clinic Attending  I saw and evaluated the patient.  I personally confirmed the key portions of the history and exam documented by Dr. Kalman Shan and I reviewed pertinent patient test results.  The assessment, diagnosis, and plan were formulated together and I agree with the documentation in the resident's note.

## 2016-01-05 ENCOUNTER — Encounter (INDEPENDENT_AMBULATORY_CARE_PROVIDER_SITE_OTHER): Payer: Medicare Other | Admitting: Ophthalmology

## 2016-01-12 ENCOUNTER — Other Ambulatory Visit: Payer: Self-pay | Admitting: Internal Medicine

## 2016-01-17 ENCOUNTER — Ambulatory Visit: Payer: Medicare Other | Admitting: Podiatry

## 2016-01-18 ENCOUNTER — Encounter (INDEPENDENT_AMBULATORY_CARE_PROVIDER_SITE_OTHER): Payer: Medicare Other | Admitting: Ophthalmology

## 2016-01-18 DIAGNOSIS — E11311 Type 2 diabetes mellitus with unspecified diabetic retinopathy with macular edema: Secondary | ICD-10-CM

## 2016-01-18 DIAGNOSIS — E113312 Type 2 diabetes mellitus with moderate nonproliferative diabetic retinopathy with macular edema, left eye: Secondary | ICD-10-CM

## 2016-01-18 DIAGNOSIS — E113391 Type 2 diabetes mellitus with moderate nonproliferative diabetic retinopathy without macular edema, right eye: Secondary | ICD-10-CM

## 2016-01-18 DIAGNOSIS — I10 Essential (primary) hypertension: Secondary | ICD-10-CM | POA: Diagnosis not present

## 2016-01-18 DIAGNOSIS — H35033 Hypertensive retinopathy, bilateral: Secondary | ICD-10-CM

## 2016-01-18 DIAGNOSIS — H43813 Vitreous degeneration, bilateral: Secondary | ICD-10-CM | POA: Diagnosis not present

## 2016-01-25 ENCOUNTER — Ambulatory Visit (INDEPENDENT_AMBULATORY_CARE_PROVIDER_SITE_OTHER): Payer: Medicare Other | Admitting: Internal Medicine

## 2016-01-25 ENCOUNTER — Encounter: Payer: Self-pay | Admitting: Internal Medicine

## 2016-01-25 VITALS — BP 122/70 | HR 96 | Ht 63.0 in | Wt 140.0 lb

## 2016-01-25 DIAGNOSIS — Z794 Long term (current) use of insulin: Secondary | ICD-10-CM

## 2016-01-25 DIAGNOSIS — E1142 Type 2 diabetes mellitus with diabetic polyneuropathy: Secondary | ICD-10-CM | POA: Diagnosis not present

## 2016-01-25 NOTE — Patient Instructions (Signed)
Please continue: - Levemir 15 units at bedtime. - Humalog: - 2 units with a regular meal - 4 units with a larger meal - Metformin 1000 mg 2x a day, with meals - Victoza 1.8 mg in am before b'fast  Please return in 2 months with your sugar log.

## 2016-01-25 NOTE — Progress Notes (Signed)
Patient ID: [name], female   DOB: 1950/02/08, 65 y.o.   MRN: [id number]   HPI: [name] is a 65 y.o.-year-old female, initially referred by Dr. Arnell Asal for f/u for DM2, dx in 1991-1992, insulin-dependent since 2000s, uncontrolled, with complications (DR, PN). Last visit 1.5 mo ago.  She saw nutrition since last visit.  Last hemoglobin A1c was: Lab Results  Component Value Date   HGBA1C 11.7 12/01/2015   HGBA1C 10.7 08/29/2015   HGBA1C 8.7 06/13/2015   Pt is on a regimen of: - Levemir 15 units at bedtime. - Humalog - may forget if eating out >> sugars 200s: - 3 >> 2 units with a regular meal - 4 units with a larger meal - Metformin 1000 mg 2x a day, with meals - Victoza 1.8 mg in am before b'fast  Pt checks her sugars 1-3x a day and they are better: Prev.: 300s-HI, before 11/20/2015 - am: 56-99, 186 >> 90, 116-146, 164 - 2h after b'fast: n/c - before lunch: 53-138 (low when skips b'fast) >> 80, 87-135, but if forgets Humalog in am: 180-239 - 2h after lunch: 126, 214 >> 143, 315 - before dinner: 56-118 >> 114-149, but if forgets Humalog with lunch: 238, 250  - 2h after dinner: 413 - bedtime: 164 - nighttime: n/c >> 67 x1 - Christmas Eve Lowest sugar was 53 >> 67; she has hypoglycemia awareness at 80.  Highest sugar was HI >> 200s (Thanksgiving).  Glucometer: One Touch Verio  Pt's meals are: - Breakfast: cereals + milk + banana; scrambled egg + toast; or skips - Lunch: may skip; fast food - Dinner: baked chicken + veggies + starch - Snacks: icecream - stopped icecream 1 week ago  - no CKD, last BUN/creatinine:  Lab Results  Component Value Date   BUN 15 10/18/2015   BUN 19 10/03/2015   CREATININE 0.96 10/18/2015   CREATININE 0.99 10/03/2015   - last set of lipids: Lab Results  Component Value Date   CHOL 190 10/18/2015   HDL 53 10/18/2015   LDLCALC 118 (H) 10/18/2015   TRIG 93 10/18/2015   CHOLHDL 3.6 10/18/2015  On Pravastatin. - last  eye exam was in 03/30/2015. + DR.  - + numbness and tingling in her feet.  Pt has FH of DM in both daughters. Also, in father.  ROS: Constitutional: no Fatigue,no weight loss/gain, + hot flashes, + nocturia Eyes: no blurry vision, no xerophthalmia ENT: no sore throat, no nodules palpated in throat, no dysphagia/odynophagia, no hoarseness Cardiovascular: no CP/SOB/palpitations/leg swelling Respiratory: no cough/SOB Gastrointestinal: no N/V/D/+ C Musculoskeletal: + muscle aches/no joint aches Skin: no rash Neurological: no tremors/numbness/tingling/dizziness  I reviewed pt's medications, allergies, PMH, social hx, family hx, and changes were documented in the history of present illness. Otherwise, unchanged from my initial visit note.  Past Medical History:  Diagnosis Date  . Allergy   . Anemia   . Arthritis   . BREAST CANCER, HX OF 04/16/2006   Qualifier: Diagnosis of  By: Leward Quan MD, Pamala Hurry    . Cancer ([place])   . Cataract   . Chronic kidney disease   . Depression   . Diabetes mellitus without complication ([place])   . Diverticulosis   . Glaucoma   . Hyperlipidemia   . Hypertension   . Hypothyroidism   . Left arm pain 07/28/2012  . Lymphedema of upper extremity    right  . Neuromuscular disorder ([place])   . Thyroid disease    Past Surgical History:  Procedure Laterality Date  . ABDOMINAL HYSTERECTOMY    . BREAST SURGERY    . EYE SURGERY    . FINGER SURGERY Left 2016   thumb infection (3 times so far)  . GANGLION CYST EXCISION Right 2016   right wrist  . I&D EXTREMITY Left 09/09/2014   Procedure: IRRIGATION AND DEBRIDEMENT LEFT THUMB DISTAL PHALANX;  Surgeon: Daryll Brod, MD;  Location: [facility];  Service: Orthopedics;  Laterality: Left;  [name] MASTECTOMY Right 1999  . TUBAL LIGATION     Social History   Social History  . Marital status: Widow     Spouse name: N/A  . Number of children: 4   Occupational History  . Retired   Social History Main  Topics  . Smoking status: Smoker  . Smokeless tobacco: Never Used  . Alcohol use yes  . Drug use: yes   Current Outpatient Prescriptions on File Prior to Visit  Medication Sig Dispense Refill  . amLODipine (NORVASC) 10 MG tablet Take 1 tablet (10 mg total) by mouth daily. 30 tablet 11  . aspirin EC 81 MG tablet Take 81 mg by mouth at bedtime.    . Blood Glucose Monitoring Suppl (ONETOUCH VERIO FLEX SYSTEM) w/Device KIT 1 application by Does not apply route 3 (three) times daily. E11.49, patient is insulin-requiring, needs meter replacement (broken meter) 1 kit 0  . Cholecalciferol (VITAMIN D-3) 1000 UNITS CAPS Take 2,000 Units by mouth daily.     . cycloSPORINE (RESTASIS) 0.05 % ophthalmic emulsion Place 1 drop into both eyes 2 (two) times daily.    . diphenhydrAMINE (BENADRYL) 25 mg capsule Take 1 capsule (25 mg total) by mouth every 6 (six) hours as needed for itching. 24 capsule 2  . glucose 4 GM chewable tablet Chew 4 tablets (16 g total) by mouth as needed for low blood sugar. 50 tablet 12  . glucose blood test strip Use one touch verio strips  2 times  daily to check blood sugar. diag code E11.40. Insulin dependent 200 each 3  . hydrochlorothiazide (MICROZIDE) 12.5 MG capsule TAKE 1 CAPSULE BY MOUTH EVERY DAY 90 capsule 3  . hydrocortisone cream 1 % Apply to affected area 2 times daily 30 g 1  . Insulin Detemir (LEVEMIR FLEXTOUCH) 100 UNIT/ML Pen Inject 15 Units into the skin daily at 10 pm. 15 mL 6  . insulin lispro (HUMALOG KWIKPEN) 100 UNIT/ML KiwkPen Take 3-4U three times a day before meals. 15 mL 11  . Insulin Pen Needle (BD PEN NEEDLE NANO U/F) 32G X 4 MM MISC Use to give Levemir insulin at bedtime. Code E11.40. 100 each 3  . Insulin Syringe-Needle U-100 (B-D INS SYR ULTRAFINE .5CC/30G) 30G X 1/2" 0.5 ML MISC To use 4 times daily for injecting insulin. diag code 250.42. Insulin dependent 150 each 6  . levothyroxine (SYNTHROID, LEVOTHROID) 75 MCG tablet TAKE 1 TABLET BY MOUTH DAILY  BEFORE BREAKFAST 90 tablet 3  . Liraglutide 18 MG/3ML SOPN Inject 0.3 mLs (1.8 mg total) into the skin daily. 6 mL 3  . metFORMIN (GLUCOPHAGE) 1000 MG tablet Take 1 tablet (1,000 mg total) by mouth 2 (two) times daily with a meal. 60 tablet 6  . ONETOUCH DELICA LANCETS FINE MISC USE TO CHECK BLOOD SUGAR TWICE DAILY 200 each 0  . Polyvinyl Alcohol-Povidone (REFRESH OP) Place 1 drop into both eyes 3 (three) times daily.    . potassium chloride 20 MEQ/15ML (10%) SOLN Take 15 mLs (20 mEq total) by mouth  daily as needed (for muscle cramps). 240 mL 0  . pravastatin (PRAVACHOL) 40 MG tablet TAKE 1 TABLET BY MOUTH DAILY 90 tablet 3  . pregabalin (LYRICA) 50 MG capsule Take 1 capsule (50 mg total) by mouth 3 (three) times daily. 90 capsule 2  . Q-NOL 325 MG tablet TAKE 1 TABLET(325 MG) BY MOUTH EVERY 8 HOURS AS NEEDED FOR PAIN 90 tablet 0  . venlafaxine XR (EFFEXOR-XR) 150 MG 24 hr capsule TAKE 1 CAPSULE(150 MG) BY MOUTH DAILY WITH BREAKFAST 90 capsule 1  . zoster vaccine live, PF, (ZOSTAVAX) 09295 UNT/0.65ML injection Inject 19,400 Units into the skin once. 1 each 0   No current facility-administered medications on file prior to visit.    Allergies  Allergen Reactions  . Gabapentin Other (See Comments)    Dizziness from 300 mg, tolerates 100 mg  . Penicillins Rash  . Sulfonamide Derivatives Rash   Family History  Problem Relation Age of Onset  . Heart disease Father   . Diabetes Father   . Stroke Sister   . Anuerysm Sister 12    brain; maternal half-sister  . Heart disease Brother   . Anuerysm Brother 39    aortic; maternal half-brother  . Breast cancer Daughter 41    negative genetic testing in 2015  . Heart attack Daughter     45-47  . Diabetes Paternal Grandmother   . Stroke Paternal Grandfather   . Anuerysm Brother     NOS type; full brother  . Fibroids Daughter     s/p hysterectomy at 37y  . Brain cancer Maternal Uncle     dx. older than 19; NOS type  . Brain cancer Cousin      maternal 1st cousin; d. early 49s; NOS type  . Deafness Paternal Uncle     prelingual   PE: BP 122/70   Pulse 96   Ht 5' 3"  (1.6 m)   Wt 140 lb (63.5 kg)   SpO2 93%   BMI 24.80 kg/m  Wt Readings from Last 3 Encounters:  01/25/16 140 lb (63.5 kg)  12/25/15 137 lb 6.4 oz (62.3 kg)  12/01/15 151 lb (68.5 kg)   Constitutional: Normal weight, in NAD Eyes: PERRLA, EOMI, no exophthalmos ENT: moist mucous membranes, no thyromegaly, no cervical lymphadenopathy Cardiovascular: RRR, No MRG Respiratory: CTA B Gastrointestinal: abdomen soft, NT, ND, BS+ Musculoskeletal: no deformities, strength intact in all 4 Skin: moist, warm, no rashes Neurological: no tremor with outstretched hands, DTR normal in all 4  ASSESSMENT: 1. DM2, insulin-dependent, uncontrolled, with complications - DR - PN  PLAN:  1. Patient with long-standing, uncontrolled diabetes, on injectable and oral medication regimen.  - HbA1c at last visit was high, 11.7%, however, sugars were much lower c/w before and even developed low CBGs, in the 50s (after stopping icecream) >>  We decreased Levemir and Humalog doses and moved Victoza in a.m. Sugars are at goal if she takes Humalog, but they increase to 200s if she forgets. We cannot stop Humalog for now >> strongly advised her to try to not forget it and take it with her if she goes to her daughter's house or eats out. - I suggested to:  Patient Instructions  Please continue: - Levemir 15 units at bedtime. - Humalog: - 2 units with a regular meal - 4 units with a larger meal - Metformin 1000 mg 2x a day, with meals - Victoza 1.8 mg in am before b'fast  Please return in 2 months with your  sugar log.   - check sugars at different times of the day - check 3 times a day, rotating checks - advised for yearly eye exams >> she is up-to-date - UTD with PNA and flu shot - Return to clinic in 2 mo with sugar log   Philemon Kingdom, MD PhD [facility] Endocrinology

## 2016-02-01 ENCOUNTER — Ambulatory Visit: Payer: Medicare Other | Admitting: Podiatry

## 2016-02-08 ENCOUNTER — Ambulatory Visit (INDEPENDENT_AMBULATORY_CARE_PROVIDER_SITE_OTHER): Payer: Medicare Other | Admitting: Podiatry

## 2016-02-08 ENCOUNTER — Encounter: Payer: Self-pay | Admitting: Podiatry

## 2016-02-08 VITALS — Ht 63.0 in | Wt 140.0 lb

## 2016-02-08 DIAGNOSIS — E1149 Type 2 diabetes mellitus with other diabetic neurological complication: Secondary | ICD-10-CM | POA: Diagnosis not present

## 2016-02-08 DIAGNOSIS — B351 Tinea unguium: Secondary | ICD-10-CM | POA: Diagnosis not present

## 2016-02-08 DIAGNOSIS — M79676 Pain in unspecified toe(s): Secondary | ICD-10-CM | POA: Diagnosis not present

## 2016-02-08 DIAGNOSIS — L84 Corns and callosities: Secondary | ICD-10-CM | POA: Diagnosis not present

## 2016-02-08 NOTE — Progress Notes (Signed)
Patient ID: [name], female   DOB: 12/09/1950, 65 y.o.   MRN: [id number] Complaint:  Visit Type: Patient returns to my office for continued preventative foot care services. Complaint: Patient states" my nails have grown long and thick and become painful to walk and wear shoes" Patient has been diagnosed with DM with no foot complications. The patient presents for preventative foot care services. No changes to ROS.  Painful callus under the ball of left foot.  Podiatric Exam: Vascular: dorsalis pedis and posterior tibial pulses are palpable bilateral. Capillary return is immediate. Temperature gradient is WNL. Skin turgor WNL  Sensorium: Normal Semmes Weinstein monofilament test. Normal tactile sensation bilaterally. Nail Exam: Pt has thick disfigured discolored nails with subungual debris noted bilateral entire nail hallux through fifth toenails Ulcer Exam: There is no evidence of ulcer or pre-ulcerative changes or infection. Orthopedic Exam: Muscle tone and strength are WNL. No limitations in general ROM. No crepitus or effusions noted. Foot type and digits show no abnormalities. Bony prominences are unremarkable. Skin: No Porokeratosis. No infection or ulcers.  Heloma durum fifth toes B/L s/p surgery by Tuckman.  symptomatic porokeratosis sub 1 left foot.  Diagnosis:  Onychomycosis, , Pain in right toe, pain in left toes,  Heloma durum  B/L  Porokeratosis sub 1 left foot.  Treatment & Plan Procedures and Treatment: Consent by patient was obtained for treatment procedures. The patient understood the discussion of treatment and procedures well. All questions were answered thoroughly reviewed. Debridement of mycotic and hypertrophic toenails, 1 through 5 bilateral and clearing of subungual debris. No ulceration, no infection noted.  Return Visit-Office Procedure: Patient instructed to return to the office for a follow up visit 3 months for continued evaluation and treatment.  Jasani Lengel  DPM 

## 2016-02-13 ENCOUNTER — Encounter: Payer: Medicare Other | Admitting: Nutrition

## 2016-02-13 ENCOUNTER — Telehealth: Payer: Self-pay

## 2016-02-13 ENCOUNTER — Telehealth: Payer: Self-pay | Admitting: Internal Medicine

## 2016-02-13 NOTE — Telephone Encounter (Signed)
Pt is asking why she is to see linda, she had an appt for today but is unable to make it.

## 2016-02-13 NOTE — Telephone Encounter (Signed)
Called and left message regarding note from [name]. Gave call back number.

## 2016-02-13 NOTE — Telephone Encounter (Signed)
She was referred by her PCP, Minus Liberty to see diabetes education. I see that in the referral section.

## 2016-02-18 ENCOUNTER — Other Ambulatory Visit: Payer: Self-pay | Admitting: Internal Medicine

## 2016-02-19 ENCOUNTER — Emergency Department (HOSPITAL_COMMUNITY): Payer: Medicare Other

## 2016-02-19 ENCOUNTER — Encounter (HOSPITAL_COMMUNITY): Payer: Self-pay | Admitting: Emergency Medicine

## 2016-02-19 ENCOUNTER — Observation Stay (HOSPITAL_COMMUNITY)
Admission: EM | Admit: 2016-02-19 | Discharge: 2016-02-21 | Disposition: A | Discharge status: Home / Self Care | Payer: Medicare Other | Attending: Internal Medicine | Admitting: Internal Medicine

## 2016-02-19 DIAGNOSIS — Z791 Long term (current) use of non-steroidal anti-inflammatories (NSAID): Secondary | ICD-10-CM | POA: Diagnosis not present

## 2016-02-19 DIAGNOSIS — R339 Retention of urine, unspecified: Secondary | ICD-10-CM | POA: Diagnosis not present

## 2016-02-19 DIAGNOSIS — E785 Hyperlipidemia, unspecified: Secondary | ICD-10-CM | POA: Insufficient documentation

## 2016-02-19 DIAGNOSIS — Z7982 Long term (current) use of aspirin: Secondary | ICD-10-CM | POA: Insufficient documentation

## 2016-02-19 DIAGNOSIS — Z79899 Other long term (current) drug therapy: Secondary | ICD-10-CM | POA: Insufficient documentation

## 2016-02-19 DIAGNOSIS — R338 Other retention of urine: Secondary | ICD-10-CM

## 2016-02-19 DIAGNOSIS — R55 Syncope and collapse: Secondary | ICD-10-CM | POA: Diagnosis not present

## 2016-02-19 DIAGNOSIS — E872 Acidosis: Secondary | ICD-10-CM | POA: Diagnosis not present

## 2016-02-19 DIAGNOSIS — E876 Hypokalemia: Secondary | ICD-10-CM | POA: Insufficient documentation

## 2016-02-19 DIAGNOSIS — E1142 Type 2 diabetes mellitus with diabetic polyneuropathy: Secondary | ICD-10-CM | POA: Insufficient documentation

## 2016-02-19 DIAGNOSIS — F329 Major depressive disorder, single episode, unspecified: Secondary | ICD-10-CM | POA: Insufficient documentation

## 2016-02-19 DIAGNOSIS — I129 Hypertensive chronic kidney disease with stage 1 through stage 4 chronic kidney disease, or unspecified chronic kidney disease: Secondary | ICD-10-CM | POA: Insufficient documentation

## 2016-02-19 DIAGNOSIS — R1030 Lower abdominal pain, unspecified: Secondary | ICD-10-CM | POA: Diagnosis not present

## 2016-02-19 DIAGNOSIS — E039 Hypothyroidism, unspecified: Secondary | ICD-10-CM | POA: Diagnosis not present

## 2016-02-19 DIAGNOSIS — R4182 Altered mental status, unspecified: Secondary | ICD-10-CM | POA: Diagnosis present

## 2016-02-19 DIAGNOSIS — N189 Chronic kidney disease, unspecified: Secondary | ICD-10-CM | POA: Diagnosis not present

## 2016-02-19 DIAGNOSIS — R402431 Glasgow coma scale score 3-8, in the field [EMT or ambulance]: Secondary | ICD-10-CM | POA: Diagnosis not present

## 2016-02-19 DIAGNOSIS — Z794 Long term (current) use of insulin: Secondary | ICD-10-CM | POA: Insufficient documentation

## 2016-02-19 DIAGNOSIS — R109 Unspecified abdominal pain: Secondary | ICD-10-CM | POA: Diagnosis present

## 2016-02-19 DIAGNOSIS — Z853 Personal history of malignant neoplasm of breast: Secondary | ICD-10-CM | POA: Insufficient documentation

## 2016-02-19 DIAGNOSIS — N1330 Unspecified hydronephrosis: Secondary | ICD-10-CM | POA: Insufficient documentation

## 2016-02-19 DIAGNOSIS — N132 Hydronephrosis with renal and ureteral calculous obstruction: Secondary | ICD-10-CM | POA: Diagnosis not present

## 2016-02-19 DIAGNOSIS — E1122 Type 2 diabetes mellitus with diabetic chronic kidney disease: Secondary | ICD-10-CM | POA: Insufficient documentation

## 2016-02-19 LAB — RAPID URINE DRUG SCREEN, HOSP PERFORMED
Amphetamines: NOT DETECTED
Barbiturates: NOT DETECTED
Benzodiazepines: NOT DETECTED
Cocaine: NOT DETECTED
Opiates: NOT DETECTED
Tetrahydrocannabinol: NOT DETECTED

## 2016-02-19 LAB — I-STAT CREATININE, ED: Creatinine, Ser: 0.8 mg/dL (ref 0.44–1.00)

## 2016-02-19 LAB — CBC WITH DIFFERENTIAL/PLATELET
Basophils Absolute: 0 10*3/uL (ref 0.0–0.1)
Basophils Relative: 0 %
Eosinophils Absolute: 0.1 10*3/uL (ref 0.0–0.7)
Eosinophils Relative: 1 %
HCT: 37.1 % (ref 36.0–46.0)
Hemoglobin: 12.7 g/dL (ref 12.0–15.0)
Lymphocytes Relative: 14 %
Lymphs Abs: 1.6 10*3/uL (ref 0.7–4.0)
MCH: 30.5 pg (ref 26.0–34.0)
MCHC: 34.2 g/dL (ref 30.0–36.0)
MCV: 89 fL (ref 78.0–100.0)
Monocytes Absolute: 0.4 10*3/uL (ref 0.1–1.0)
Monocytes Relative: 3 %
Neutro Abs: 9.8 10*3/uL — ABNORMAL HIGH (ref 1.7–7.7)
Neutrophils Relative %: 82 %
Platelets: 245 10*3/uL (ref 150–400)
RBC: 4.17 MIL/uL (ref 3.87–5.11)
RDW: 13 % (ref 11.5–15.5)
WBC: 11.8 10*3/uL — ABNORMAL HIGH (ref 4.0–10.5)

## 2016-02-19 LAB — URINALYSIS, MICROSCOPIC (REFLEX)

## 2016-02-19 LAB — URINALYSIS, ROUTINE W REFLEX MICROSCOPIC
Bilirubin Urine: NEGATIVE
Glucose, UA: >=500 mg/dL — AB
Ketones, ur: 15 mg/dL — AB
Leukocytes, UA: NEGATIVE
Nitrite: NEGATIVE
Protein, ur: 30 mg/dL — AB
Specific Gravity, Urine: 1.015 (ref 1.005–1.030)
pH: 7.5 (ref 5.0–8.0)

## 2016-02-19 LAB — ETHANOL: Alcohol, Ethyl (B): <5 mg/dL (ref <5)

## 2016-02-19 LAB — COMPREHENSIVE METABOLIC PANEL
ALT: 16 U/L (ref 14–54)
AST: 35 U/L (ref 15–41)
Albumin: 4.3 g/dL (ref 3.5–5.0)
Alkaline Phosphatase: 64 U/L (ref 38–126)
Anion gap: 12 (ref 5–15)
BUN: 17 mg/dL (ref 6–20)
CO2: 25 mmol/L (ref 22–32)
Calcium: 9.9 mg/dL (ref 8.9–10.3)
Chloride: 101 mmol/L (ref 101–111)
Creatinine, Ser: 1 mg/dL (ref 0.44–1.00)
GFR calc Af Amer: >60 mL/min (ref >60)
GFR calc non Af Amer: 58 mL/min — ABNORMAL LOW (ref >60)
Glucose, Bld: 265 mg/dL — ABNORMAL HIGH (ref 65–99)
Potassium: 4.1 mmol/L (ref 3.5–5.1)
Sodium: 138 mmol/L (ref 135–145)
Total Bilirubin: 1.2 mg/dL (ref 0.3–1.2)
Total Protein: 7.6 g/dL (ref 6.5–8.1)

## 2016-02-19 LAB — I-STAT CG4 LACTIC ACID, ED: Lactic Acid, Venous: 2.7 mmol/L (ref 0.5–1.9)

## 2016-02-19 LAB — LIPASE, BLOOD: Lipase: 29 U/L (ref 11–51)

## 2016-02-19 LAB — TROPONIN I: Troponin I: <0.03 ng/mL (ref <0.03)

## 2016-02-19 LAB — CBG MONITORING, ED: Glucose-Capillary: 298 mg/dL — ABNORMAL HIGH (ref 65–99)

## 2016-02-19 LAB — I-STAT TROPONIN, ED: Troponin i, poc: 0 ng/mL (ref 0.00–0.08)

## 2016-02-19 LAB — AMMONIA: Ammonia: 41 umol/L — ABNORMAL HIGH (ref 9–35)

## 2016-02-19 LAB — CK: Total CK: 98 U/L (ref 38–234)

## 2016-02-19 LAB — LACTIC ACID, PLASMA: Lactic Acid, Venous: 1.6 mmol/L (ref 0.5–1.9)

## 2016-02-19 MED ORDER — INSULIN ASPART 100 UNIT/ML ~~LOC~~ SOLN: 0.0000 [IU] | Freq: Every day | SUBCUTANEOUS | Status: DC

## 2016-02-19 MED ORDER — DEXTROSE 5 % IV SOLN: 1.0000 g | Freq: Once | INTRAVENOUS | Status: DC

## 2016-02-19 MED ORDER — ACETAMINOPHEN 650 MG RE SUPP
650.0000 mg | Freq: Four times a day (QID) | RECTAL | Status: DC | PRN
Start: 2016-02-19 — End: 2016-02-21

## 2016-02-19 MED ORDER — PRAVASTATIN SODIUM 40 MG PO TABS
40.0000 mg | ORAL_TABLET | Freq: Every day | ORAL | Status: DC
Start: 2016-02-20 — End: 2016-02-21
  Administered 2016-02-20 – 2016-02-21 (×2): 40 mg via ORAL
  Filled 2016-02-19 (×4): qty 1

## 2016-02-19 MED ORDER — SODIUM CHLORIDE 0.9 % IV BOLUS (SEPSIS)
1000.0000 mL | Freq: Once | INTRAVENOUS | Status: AC
  Administered 2016-02-19: 1000 mL via INTRAVENOUS

## 2016-02-19 MED ORDER — FAMOTIDINE IN NACL 20-0.9 MG/50ML-% IV SOLN
20.0000 mg | INTRAVENOUS | Status: DC
  Administered 2016-02-19: 20 mg via INTRAVENOUS
  Filled 2016-02-19: qty 50

## 2016-02-19 MED ORDER — VENLAFAXINE HCL ER 150 MG PO CP24
150.0000 mg | ORAL_CAPSULE | Freq: Every day | ORAL | Status: DC
  Administered 2016-02-20 – 2016-02-21 (×2): 150 mg via ORAL
  Filled 2016-02-19 (×2): qty 1

## 2016-02-19 MED ORDER — KETOROLAC TROMETHAMINE 30 MG/ML IJ SOLN
15.0000 mg | Freq: Once | INTRAMUSCULAR | Status: AC
Start: 2016-02-19 — End: 2016-02-19
  Administered 2016-02-19: 15 mg via INTRAVENOUS
  Filled 2016-02-19: qty 1

## 2016-02-19 MED ORDER — INSULIN DETEMIR 100 UNIT/ML ~~LOC~~ SOLN
8.0000 [IU] | Freq: Every day | SUBCUTANEOUS | Status: DC
  Administered 2016-02-20 (×2): 8 [IU] via SUBCUTANEOUS
  Filled 2016-02-19 (×3): qty 0.08

## 2016-02-19 MED ORDER — ONDANSETRON HCL 4 MG PO TABS: 4.0000 mg | ORAL_TABLET | Freq: Four times a day (QID) | ORAL | Status: DC | PRN

## 2016-02-19 MED ORDER — INSULIN ASPART 100 UNIT/ML ~~LOC~~ SOLN
0.0000 [IU] | Freq: Three times a day (TID) | SUBCUTANEOUS | Status: DC
  Administered 2016-02-20: 2 [IU] via SUBCUTANEOUS
  Administered 2016-02-20: 1 [IU] via SUBCUTANEOUS
  Administered 2016-02-20: 3 [IU] via SUBCUTANEOUS
  Administered 2016-02-21: 5 [IU] via SUBCUTANEOUS
  Administered 2016-02-21: 1 [IU] via SUBCUTANEOUS
  Filled 2016-02-19 (×2): qty 1

## 2016-02-19 MED ORDER — ACETAMINOPHEN 325 MG PO TABS: 650.0000 mg | ORAL_TABLET | Freq: Four times a day (QID) | ORAL | Status: DC | PRN

## 2016-02-19 MED ORDER — POLYVINYL ALCOHOL 1.4 % OP SOLN
1.0000 [drp] | Freq: Three times a day (TID) | OPHTHALMIC | Status: DC
  Administered 2016-02-20 – 2016-02-21 (×3): 1 [drp] via OPHTHALMIC
  Filled 2016-02-19 (×2): qty 15

## 2016-02-19 MED ORDER — ONDANSETRON HCL 4 MG/2ML IJ SOLN
INTRAMUSCULAR | Status: AC
  Administered 2016-02-19: 4 mg
  Filled 2016-02-19: qty 2

## 2016-02-19 MED ORDER — LEVOTHYROXINE SODIUM 75 MCG PO TABS
75.0000 ug | ORAL_TABLET | Freq: Every day | ORAL | Status: DC
  Administered 2016-02-20 – 2016-02-21 (×2): 75 ug via ORAL
  Filled 2016-02-19 (×2): qty 1

## 2016-02-19 MED ORDER — AMLODIPINE BESYLATE 10 MG PO TABS
10.0000 mg | ORAL_TABLET | Freq: Every day | ORAL | Status: DC
Start: 2016-02-20 — End: 2016-02-21
  Administered 2016-02-20 – 2016-02-21 (×2): 10 mg via ORAL
  Filled 2016-02-19: qty 2
  Filled 2016-02-19: qty 1

## 2016-02-19 MED ORDER — FENTANYL CITRATE (PF) 100 MCG/2ML IJ SOLN
50.0000 ug | Freq: Once | INTRAMUSCULAR | Status: AC
  Administered 2016-02-19: 50 ug via INTRAVENOUS
  Filled 2016-02-19: qty 2

## 2016-02-19 MED ORDER — CYCLOSPORINE 0.05 % OP EMUL
1.0000 [drp] | Freq: Two times a day (BID) | OPHTHALMIC | Status: DC
  Administered 2016-02-20 – 2016-02-21 (×3): 1 [drp] via OPHTHALMIC
  Filled 2016-02-19 (×5): qty 1

## 2016-02-19 MED ORDER — VITAMIN D 1000 UNITS PO TABS
2000.0000 [IU] | ORAL_TABLET | Freq: Every day | ORAL | Status: DC
  Administered 2016-02-20 – 2016-02-21 (×2): 2000 [IU] via ORAL
  Filled 2016-02-19 (×2): qty 2

## 2016-02-19 MED ORDER — PREGABALIN 50 MG PO CAPS
50.0000 mg | ORAL_CAPSULE | Freq: Three times a day (TID) | ORAL | Status: DC
  Administered 2016-02-20 – 2016-02-21 (×5): 50 mg via ORAL
  Filled 2016-02-19 (×5): qty 1

## 2016-02-19 MED ORDER — DEXTROSE 5 % IV SOLN
1.0000 g | INTRAVENOUS | Status: DC
  Administered 2016-02-20: 1 g via INTRAVENOUS
  Filled 2016-02-19 (×3): qty 10

## 2016-02-19 MED ORDER — IOPAMIDOL (ISOVUE-300) INJECTION 61%
INTRAVENOUS | Status: AC
  Administered 2016-02-19: 100 mL
  Filled 2016-02-19: qty 100

## 2016-02-19 MED ORDER — SODIUM CHLORIDE 0.9% FLUSH
3.0000 mL | Freq: Two times a day (BID) | INTRAVENOUS | Status: DC
  Administered 2016-02-20: 3 mL via INTRAVENOUS

## 2016-02-19 MED ORDER — KETOROLAC TROMETHAMINE 30 MG/ML IJ SOLN: 15.0000 mg | Freq: Four times a day (QID) | INTRAMUSCULAR | Status: DC | PRN

## 2016-02-19 MED ORDER — DEXTROSE 5 % IV SOLN
1.0000 g | Freq: Once | INTRAVENOUS | Status: AC
  Administered 2016-02-19: 1 g via INTRAVENOUS
  Filled 2016-02-19: qty 10

## 2016-02-19 MED ORDER — ENOXAPARIN SODIUM 40 MG/0.4ML ~~LOC~~ SOLN
40.0000 mg | SUBCUTANEOUS | Status: DC
  Administered 2016-02-20 – 2016-02-21 (×2): 40 mg via SUBCUTANEOUS
  Filled 2016-02-19 (×3): qty 0.4

## 2016-02-19 MED ORDER — ONDANSETRON HCL 4 MG/2ML IJ SOLN
4.0000 mg | Freq: Four times a day (QID) | INTRAMUSCULAR | Status: DC | PRN
  Administered 2016-02-20: 4 mg via INTRAVENOUS
  Filled 2016-02-19: qty 2

## 2016-02-19 MED ORDER — ASPIRIN EC 81 MG PO TBEC
81.0000 mg | DELAYED_RELEASE_TABLET | Freq: Every day | ORAL | Status: DC
  Administered 2016-02-20: 81 mg via ORAL
  Filled 2016-02-19: qty 1

## 2016-02-19 MED ORDER — SODIUM CHLORIDE 0.9 % IV SOLN
INTRAVENOUS | Status: DC
  Administered 2016-02-20 – 2016-02-21 (×3): via INTRAVENOUS

## 2016-02-19 NOTE — H&P (Signed)
Date: 02/19/2016               Patient Name:  [name] MRN: [id number]  DOB: [birth year] Age / Sex: 66 y.o., female   PCP: Aldine Contes, MD         Medical Service: Internal Medicine Teaching Service         Attending Physician: Dr. Lucious Groves, DO    First Contact: Dr. Inda Castle Pager: [phone]  Second Contact: Dr. Juleen China Pager: [phone]       After Hours (After 5p/  First Contact Pager: [phone]  weekends / holidays): Second Contact Pager: [phone]   Chief Complaint: "I've been throwing up, then I passed out on my porch."  History of Present Illness: [name] is a 66 y.o. female with history of DM, HTN, and CKD  who presents with acute abdominal pain, nausea, vomiting, and fatigue.  Yesterday she started feeling sick, with intense, burning pain in her lower abdomen radiating to her lower back.  She has also been nauseous with multiple episodes of bilious emesis.  She also had several episdoes of loose stools yesterday.  After church yesterday, she went home to try and recuperate, but continued to feel worse.  She called her sister-in-law to come pick her up and take her to the hospital, and walked out to the porch.  After that, her sister-in-law found her about 10 minutes later lying on the porch in the fetal position.  Her eyes were closed and she did not respond to them touching her or calling her name.  Her brother rolled her onto her back, raised her arms over her head, and prepared to start CPR when she groaned and EMS arrived.  In the ED,   One week ago, she had a sudden, stabbing pain in her L flank which radiated to her groin, and she noticed some blood when she urinated.  She has had no further pain like that, and no more hematuria or vaginal bleeding.  Otherwise, she has been in her usual state of health.  Denies chest pain, dyspnea, fevers, weakness, headaches, and other symptoms.  Has not taken any of her meds since yesterday.  Meds:  Current Meds    Medication Sig  . acetaminophen (TYLENOL) 325 MG tablet Take 325 mg by mouth every 6 (six) hours as needed.  [name] amLODipine (NORVASC) 10 MG tablet Take 1 tablet (10 mg total) by mouth daily.  [name] aspirin EC 81 MG tablet Take 81 mg by mouth at bedtime.  . Cholecalciferol (VITAMIN D-3) 1000 UNITS CAPS Take 2,000 Units by mouth daily.   . cycloSPORINE (RESTASIS) 0.05 % ophthalmic emulsion Place 1 drop into both eyes 2 (two) times daily.  . diphenhydrAMINE (BENADRYL) 25 mg capsule Take 1 capsule (25 mg total) by mouth every 6 (six) hours as needed for itching.  [name] glucose 4 GM chewable tablet Chew 4 tablets (16 g total) by mouth as needed for low blood sugar.  . hydrochlorothiazide (MICROZIDE) 12.5 MG capsule TAKE 1 CAPSULE BY MOUTH EVERY DAY  . Insulin Detemir (LEVEMIR FLEXTOUCH) 100 UNIT/ML Pen Inject 15 Units into the skin daily at 10 pm.  . insulin lispro (HUMALOG KWIKPEN) 100 UNIT/ML KiwkPen Take 3-4U three times a day before meals.  [name] levothyroxine (SYNTHROID, LEVOTHROID) 75 MCG tablet TAKE 1 TABLET BY MOUTH DAILY BEFORE BREAKFAST  . Liraglutide 18 MG/3ML SOPN Inject 0.3 mLs (1.8 mg total) into the skin daily.  . meloxicam (MOBIC) 15 MG tablet Take  15 mg by mouth daily.  . metFORMIN (GLUCOPHAGE) 1000 MG tablet Take 1 tablet (1,000 mg total) by mouth 2 (two) times daily with a meal.  . Polyvinyl Alcohol-Povidone (REFRESH OP) Place 1 drop into both eyes 3 (three) times daily.  . potassium chloride 20 MEQ/15ML (10%) SOLN Take 15 mLs (20 mEq total) by mouth daily as needed (for muscle cramps).  . pravastatin (PRAVACHOL) 40 MG tablet TAKE 1 TABLET BY MOUTH DAILY  . pregabalin (LYRICA) 50 MG capsule Take 1 capsule (50 mg total) by mouth 3 (three) times daily.  [name] venlafaxine XR (EFFEXOR-XR) 150 MG 24 hr capsule TAKE 1 CAPSULE(150 MG) BY MOUTH DAILY WITH BREAKFAST     Allergies: Allergies as of 02/19/2016 - Review Complete 02/19/2016  Allergen Reaction Noted  . Gabapentin Other (See Comments)  12/12/2011  . Penicillins Rash 04/16/2006  . Sulfonamide derivatives Rash 04/16/2006   Past Medical History:  Diagnosis Date  . Allergy   . Anemia   . Arthritis   . BREAST CANCER, HX OF 04/16/2006   Qualifier: Diagnosis of  By: Leward Quan MD, Pamala Hurry    . Cancer ([place])   . Cataract   . Chronic kidney disease   . Depression   . Diabetes mellitus without complication ([place])   . Diverticulosis   . Glaucoma   . Hyperlipidemia   . Hypertension   . Hypothyroidism   . Left arm pain 07/28/2012  . Lymphedema of upper extremity    right  . Neuromuscular disorder ([place])   . Thyroid disease    Family History:  Father with DM, CAD Siblings with CAD  Social History:  Never smoker No alcohol or drugs  Review of Systems: A complete ROS was negative except as per HPI.  Physical Exam: Blood pressure 145/77, pulse 104, temperature (!) 96.8 F (36 C), temperature source Rectal, resp. rate (!) 27, SpO2 100 %.  Physical Exam  Constitutional: She is oriented to person, place, and time.  Elderly woman lying in bed under pile of blankets, tearful and in moderate distress  HENT:  Head: Normocephalic and atraumatic.  Mouth/Throat: Oropharynx is clear and moist. No oropharyngeal exudate.  Eyes: Conjunctivae are normal. No scleral icterus.  Neck: Normal range of motion. Neck supple.  Cardiovascular: Normal rate, regular rhythm and normal heart sounds.   Pulmonary/Chest: Effort normal and breath sounds normal. No respiratory distress. She has no wheezes.  Abdominal:  Suprapubic tenderness Diffuse abdominal tenderness Soft, no massess  Genitourinary:  Genitourinary Comments: R CVA tenderness  Musculoskeletal: She exhibits no edema, tenderness or deformity.  Neurological: She is alert and oriented to person, place, and time. No cranial nerve deficit.  Psychiatric: She has a normal mood and affect. Her behavior is normal.   CBC Latest Ref Rng & Units 02/19/2016 09/09/2014 06/21/2014  WBC 4.0 -  10.5 K/uL 11.8(H) - -  Hemoglobin 12.0 - 15.0 g/dL 12.7 11.9(L) 13.3  Hematocrit 36.0 - 46.0 % 37.1 35.0(L) 39.0  Platelets 150 - 400 K/uL 245 - -   CMP Latest Ref Rng & Units 02/19/2016 02/19/2016 10/18/2015  Glucose 65 - 99 mg/dL - 265(H) 188(H)  BUN 6 - 20 mg/dL - 17 15  Creatinine 0.44 - 1.00 mg/dL 0.80 1.00 0.96  Sodium 135 - 145 mmol/L - 138 141  Potassium 3.5 - 5.1 mmol/L - 4.1 3.9  Chloride 101 - 111 mmol/L - 101 96  CO2 22 - 32 mmol/L - 25 28  Calcium 8.9 - 10.3 mg/dL - 9.9 10.1  Total Protein 6.5 - 8.1 g/dL - 7.6 -  Total Bilirubin 0.3 - 1.2 mg/dL - 1.2 -  Alkaline Phos 38 - 126 U/L - 64 -  AST 15 - 41 U/L - 35 -  ALT 14 - 54 U/L - 16 -   Urinalysis    Component Value Date/Time   COLORURINE YELLOW 02/19/2016 [place] 02/19/2016 1607   LABSPEC 1.015 02/19/2016 1607   PHURINE 7.5 02/19/2016 1607   GLUCOSEU >=500 (A) 02/19/2016 1607   HGBUR TRACE (A) 02/19/2016 1607   BILIRUBINUR NEGATIVE 02/19/2016 1607   KETONESUR 15 (A) 02/19/2016 1607   PROTEINUR 30 (A) 02/19/2016 1607   UROBILINOGEN 0.2 04/01/2014 1110   NITRITE NEGATIVE 02/19/2016 1607   LEUKOCYTESUR NEGATIVE 02/19/2016 1607   Lipase     Component Value Date/Time   LIPASE 29 02/19/2016 1345   Troponin (Point of Care Test)  Recent Labs  02/19/16 1353  TROPIPOC 0.00   Lactic Acid, Venous    Component Value Date/Time   LATICACIDVEN 2.70 (HH) 02/19/2016 1356   CT Abdomen and Pelvis 02/19/2016 IMPRESSION: Mild suspected gastritis.  Mild RIGHT hydroureteronephrosis, this can be seen with urinary tract infection, recently passed stone or reflux.  Grade 1 L4-5 anterolisthesis on degenerative basis resulting in severe L4-5 neural foraminal narrowing and moderate to severe canal stenosis.  CT Head and Cervical Spine 02/19/2016 IMPRESSION: 1. No acute intracranial abnormality. 2. No evidence for cervical spine fracture. 3. Abnormal appearance of the right clavicle and anterior aspect  of the right upper ribs which may be related to patient's known breast cancer.  Chest Radiograph AP 02/19/2016 IMPRESSION: No edema or consolidation.  Assessment & Plan by Problem:  66 y.o. female with acute abdominal pain, nausea/vomiting, and CVA tenderness with mild leukocytosis and hypothermia concerning for infection.  Overall, her clinical picture is concerning for nephrolithiasis or pyelonephritis, though her UA does not strongly suggest either.  Her abdomen is benign, though she could have a gastroenteritis.  Will begin empiric antibiotics and give supportive care.   #Abdominal/Suprapubic Pain #Right CVA Tenderness -empiric ceftriaxone -toradol and tylenol PRN for pain -Zofran PRN -IVF  #Syncope Most likely orthostasis in context of acute illness and poor oral intake.  Nonfocal neurologic exam, normal head CT, unlikely seizure or stoke  Orthostatic vital signs unable to be obtained in the ED before volume resuscitation.  No signs of trauma, no rhabdomyolysis.  -orthostatics  #Mild Leukocytosis qSOFA 2/3, SIRS 3/4, concern for infection.  May be related to GI/GU complaints, May be related to  -Follow CBC -Flu PCR  #Lactic Acidosis Received 2L bolus in ED. -IVF -Follow lactate  #DM -SSI  #Hypothyroidism -continue home synthroid  #Depression -continue home venlafaxine  Fluids: NS 100 mL/hr Diet: carb modified DVT Prophylaxis: lovenox Code Status:   Dispo: Admit patient to Observation with expected length of stay less than 2 midnights.  Signed: Minus Liberty, MD 02/19/2016, 7:12 PM  Pager: [phone]

## 2016-02-19 NOTE — ED Notes (Signed)
Pt in and out cathed. 650 mL of output

## 2016-02-19 NOTE — ED Notes (Signed)
Transported pt to CT.

## 2016-02-19 NOTE — ED Provider Notes (Signed)
[facility] DEPT Provider Note   CSN: [id number] Arrival date & time: 02/19/16  1330   LEVEL 5 CAVEAT - ALTERED MENTAL STATUS  History   Chief Complaint Chief Complaint  Patient presents with  . Altered Mental Status    HPI [name] is a 66 y.o. female.  HPI  66 year old female presents with altered mental status. History is limited and taken from EMS as the patient is not responding to questions. Her EMS, the patient was speaking normally to family at 12:30 PM. They went to her house because on the phone she stated that she was "not feeling well". Found her unresponsive laying on her side. Unclear if she had fallen but there were no obvious injuries. EMS reports that the patient was given 2 mg Narcan IV without any response. Glucose was 200. Patient responds to deep painful stimuli but does not follow commands well.  Past Medical History:  Diagnosis Date  . Allergy   . Anemia   . Arthritis   . BREAST CANCER, HX OF 04/16/2006   Qualifier: Diagnosis of  By: Leward Quan MD, Pamala Hurry    . Cancer ([place])   . Cataract   . Chronic kidney disease   . Depression   . Diabetes mellitus without complication ([place])   . Diverticulosis   . Glaucoma   . Hyperlipidemia   . Hypertension   . Hypothyroidism   . Left arm pain 07/28/2012  . Lymphedema of upper extremity    right  . Neuromuscular disorder ([place])   . Thyroid disease     Patient Active Problem List   Diagnosis Date Noted  . Syncope 02/19/2016  . Genetic testing 11/27/2015  . Pruritus 11/09/2015  . Cramps of right lower extremity 10/18/2015  . Trigger finger of right hand 06/22/2015  . Preventative health care 08/26/2012  . Dysphagia 08/26/2012  . HTN, goal below 140/90 08/13/2012  . Chronic kidney disease 08/13/2012  . Glaucoma associated with systemic syndromes(365.44) 08/07/2012  . Hypothyroidism 04/16/2006  . ANEMIA-NOS 04/16/2006  . Depression 04/16/2006  . LOW BACK PAIN 04/16/2006  . BREAST CANCER, HX OF  04/16/2006  . Hyperlipidemia 10/13/2003  . Type 2 diabetes mellitus with diabetic polyneuropathy, with long-term current use of insulin ([place]) 04/16/1991    Past Surgical History:  Procedure Laterality Date  . ABDOMINAL HYSTERECTOMY    . BREAST SURGERY    . EYE SURGERY    . FINGER SURGERY Left 2016   thumb infection (3 times so far)  . GANGLION CYST EXCISION Right 2016   right wrist  . I&D EXTREMITY Left 09/09/2014   Procedure: IRRIGATION AND DEBRIDEMENT LEFT THUMB DISTAL PHALANX;  Surgeon: Daryll Brod, MD;  Location: [place] [place];  Service: Orthopedics;  Laterality: Left;  [name] MASTECTOMY Right 1999  . TUBAL LIGATION      OB History    No data available       Home Medications    Prior to Admission medications   Medication Sig Start Date End Date Taking? Authorizing Provider  acetaminophen (TYLENOL) 325 MG tablet Take 325 mg by mouth every 6 (six) hours as needed.   Yes Historical Provider, MD  amLODipine (NORVASC) 10 MG tablet Take 1 tablet (10 mg total) by mouth daily. 04/28/15 04/27/16 Yes Bartholomew Crews, MD  aspirin EC 81 MG tablet Take 81 mg by mouth at bedtime.   Yes Historical Provider, MD  Cholecalciferol (VITAMIN D-3) 1000 UNITS CAPS Take 2,000 Units by mouth daily.    Yes Historical  Provider, MD  cycloSPORINE (RESTASIS) 0.05 % ophthalmic emulsion Place 1 drop into both eyes 2 (two) times daily.   Yes Historical Provider, MD  diphenhydrAMINE (BENADRYL) 25 mg capsule Take 1 capsule (25 mg total) by mouth every 6 (six) hours as needed for itching. 11/09/15 11/08/16 Yes Minus Liberty, MD  glucose 4 GM chewable tablet Chew 4 tablets (16 g total) by mouth as needed for low blood sugar. 05/28/12  Yes Alejandro Paya, DO  hydrochlorothiazide (MICROZIDE) 12.5 MG capsule TAKE 1 CAPSULE BY MOUTH EVERY DAY 02/19/16  Yes Nischal Narendra, MD  Insulin Detemir (LEVEMIR FLEXTOUCH) 100 UNIT/ML Pen Inject 15 Units into the skin daily at 10 pm. 12/01/15  Yes Philemon Kingdom,  MD  insulin lispro (HUMALOG KWIKPEN) 100 UNIT/ML KiwkPen Take 3-4U three times a day before meals. 12/01/15  Yes Philemon Kingdom, MD  levothyroxine (SYNTHROID, LEVOTHROID) 75 MCG tablet TAKE 1 TABLET BY MOUTH DAILY BEFORE BREAKFAST 04/17/15  Yes Nischal Narendra, MD  Liraglutide 18 MG/3ML SOPN Inject 0.3 mLs (1.8 mg total) into the skin daily. 10/03/15  Yes Nischal Dareen Piano, MD  meloxicam (MOBIC) 15 MG tablet Take 15 mg by mouth daily.   Yes Historical Provider, MD  metFORMIN (GLUCOPHAGE) 1000 MG tablet Take 1 tablet (1,000 mg total) by mouth 2 (two) times daily with a meal. 08/29/15  Yes Nischal Narendra, MD  Polyvinyl Alcohol-Povidone (REFRESH OP) Place 1 drop into both eyes 3 (three) times daily.   Yes Historical Provider, MD  potassium chloride 20 MEQ/15ML (10%) SOLN Take 15 mLs (20 mEq total) by mouth daily as needed (for muscle cramps). 11/09/15  Yes Minus Liberty, MD  pravastatin (PRAVACHOL) 40 MG tablet TAKE 1 TABLET BY MOUTH DAILY 05/26/15  Yes Nischal Dareen Piano, MD  pregabalin (LYRICA) 50 MG capsule Take 1 capsule (50 mg total) by mouth 3 (three) times daily. 10/11/15 10/10/16 Yes Asencion Partridge, MD  venlafaxine XR (EFFEXOR-XR) 150 MG 24 hr capsule TAKE 1 CAPSULE(150 MG) BY MOUTH DAILY WITH BREAKFAST 09/05/15  Yes Annia Belt, MD    Family History Family History  Problem Relation Age of Onset  . Heart disease Father   . Diabetes Father   . Stroke Sister   . Anuerysm Sister 48    brain; maternal half-sister  . Heart disease Brother   . Anuerysm Brother 57    aortic; maternal half-brother  . Breast cancer Daughter 79    negative genetic testing in 2015  . Heart attack Daughter     40-47  . Diabetes Paternal Grandmother   . Stroke Paternal Grandfather   . Anuerysm Brother     NOS type; full brother  . Fibroids Daughter     s/p hysterectomy at 53y  . Brain cancer Maternal Uncle     dx. older than 1; NOS type  . Brain cancer Cousin     maternal 1st cousin; d. early 107s; NOS  type  . Deafness Paternal Uncle     prelingual    Social History Social History  Substance Use Topics  . Smoking status: Never Smoker  . Smokeless tobacco: Never Used  . Alcohol use No     Allergies   Gabapentin; Penicillins; and Sulfonamide derivatives   Review of Systems Review of Systems  Unable to perform ROS: Mental status change     Physical Exam Updated Vital Signs BP 160/84   Pulse 86   Temp (!) 96.8 F (36 C) (Rectal)   Resp 20   SpO2 98%   Physical Exam  Constitutional: She appears well-developed and well-nourished. She appears lethargic.  HENT:  Head: Normocephalic and atraumatic.  Right Ear: External ear normal.  Left Ear: External ear normal.  Nose: Nose normal.  Eyes: EOM are normal. Pupils are equal, round, and reactive to light. Right eye exhibits no discharge. Left eye exhibits no discharge.  Cardiovascular: Regular rhythm and normal heart sounds.  Tachycardia present.   Pulmonary/Chest: Effort normal and breath sounds normal.  Abdominal: Soft. There is tenderness (diffuse).  Neurological: She appears lethargic. She is disoriented.  Lethargic. Arouses when I open her eyes. Now tracks, though slowly. Moves all 4 extremities to command, no obvious weakness. Appears confused, does not answer orientation questions.  Skin: Skin is warm and dry.  Nursing note and vitals reviewed.    ED Treatments / Results  Labs (all labs ordered are listed, but only abnormal results are displayed) Labs Reviewed  COMPREHENSIVE METABOLIC PANEL - Abnormal; Notable for the following:       Result Value   Glucose, Bld 265 (*)    GFR calc non Af Amer 58 (*)    All other components within normal limits  CBC WITH DIFFERENTIAL/PLATELET - Abnormal; Notable for the following:    WBC 11.8 (*)    Neutro Abs 9.8 (*)    All other components within normal limits  URINALYSIS, ROUTINE W REFLEX MICROSCOPIC - Abnormal; Notable for the following:    Glucose, UA >=500 (*)     Hgb urine dipstick TRACE (*)    Ketones, ur 15 (*)    Protein, ur 30 (*)    All other components within normal limits  AMMONIA - Abnormal; Notable for the following:    Ammonia 41 (*)    All other components within normal limits  URINALYSIS, MICROSCOPIC (REFLEX) - Abnormal; Notable for the following:    Bacteria, UA RARE (*)    Squamous Epithelial / LPF 0-5 (*)    All other components within normal limits  CBG MONITORING, ED - Abnormal; Notable for the following:    Glucose-Capillary 298 (*)    All other components within normal limits  I-STAT CG4 LACTIC ACID, ED - Abnormal; Notable for the following:    Lactic Acid, Venous 2.70 (*)    All other components within normal limits  ETHANOL  TROPONIN I  RAPID URINE DRUG SCREEN, HOSP PERFORMED  LIPASE, BLOOD  I-STAT TROPOININ, ED  I-STAT CREATININE, ED    EKG  EKG Interpretation None       Radiology Ct Head Wo Contrast  Result Date: 02/19/2016 CLINICAL DATA:  Altered mental status.  Unresponsive. EXAM: CT HEAD WITHOUT CONTRAST CT CERVICAL SPINE WITHOUT CONTRAST TECHNIQUE: Multidetector CT imaging of the head and cervical spine was performed following the standard protocol without intravenous contrast. Multiplanar CT image reconstructions of the cervical spine were also generated. COMPARISON:  09/12/2013 FINDINGS: CT HEAD FINDINGS Brain: No evidence of acute infarction, hemorrhage, hydrocephalus, extra-axial collection or mass lesion/mass effect. Vascular: No hyperdense vessel or unexpected calcification. Skull: Normal. Negative for fracture or focal lesion. Sinuses/Orbits: No acute finding. Other: None. CT CERVICAL SPINE FINDINGS Alignment: Normal. Skull base and vertebrae: No acute fracture. No primary bone lesion or focal pathologic process. Soft tissues and spinal canal: No prevertebral fluid or swelling. No visible canal hematoma. Disc levels: Mild multi level disc space narrowing and ventral endplate spurring is identified C5-6 and  C6-7. Upper chest: There is abnormal appearance of the right clavicle as well as the right first and second ribs which  may be related to patient's breast cancer. Other: None IMPRESSION: 1. No acute intracranial abnormality. 2. No evidence for cervical spine fracture. 3. Abnormal appearance of the right clavicle and anterior aspect of the right upper ribs which may be related to patient's known breast cancer. Electronically Signed   By: Kerby Moors M.D.   On: 02/19/2016 15:22   Ct Cervical Spine Wo Contrast  Result Date: 02/19/2016 CLINICAL DATA:  Altered mental status.  Unresponsive. EXAM: CT HEAD WITHOUT CONTRAST CT CERVICAL SPINE WITHOUT CONTRAST TECHNIQUE: Multidetector CT imaging of the head and cervical spine was performed following the standard protocol without intravenous contrast. Multiplanar CT image reconstructions of the cervical spine were also generated. COMPARISON:  09/12/2013 FINDINGS: CT HEAD FINDINGS Brain: No evidence of acute infarction, hemorrhage, hydrocephalus, extra-axial collection or mass lesion/mass effect. Vascular: No hyperdense vessel or unexpected calcification. Skull: Normal. Negative for fracture or focal lesion. Sinuses/Orbits: No acute finding. Other: None. CT CERVICAL SPINE FINDINGS Alignment: Normal. Skull base and vertebrae: No acute fracture. No primary bone lesion or focal pathologic process. Soft tissues and spinal canal: No prevertebral fluid or swelling. No visible canal hematoma. Disc levels: Mild multi level disc space narrowing and ventral endplate spurring is identified C5-6 and C6-7. Upper chest: There is abnormal appearance of the right clavicle as well as the right first and second ribs which may be related to patient's breast cancer. Other: None IMPRESSION: 1. No acute intracranial abnormality. 2. No evidence for cervical spine fracture. 3. Abnormal appearance of the right clavicle and anterior aspect of the right upper ribs which may be related to patient's  known breast cancer. Electronically Signed   By: Kerby Moors M.D.   On: 02/19/2016 15:22   Ct Abdomen Pelvis W Contrast  Result Date: 02/19/2016 CLINICAL DATA:  Feeling unwell, found unresponsive on front porch. Abdominal tenderness per emergency department nurse. History of breast cancer, diverticulosis, diabetes. EXAM: CT ABDOMEN AND PELVIS WITH CONTRAST TECHNIQUE: Multidetector CT imaging of the abdomen and pelvis was performed using the standard protocol following bolus administration of intravenous contrast. CONTRAST:  100 cc Isovue 370 COMPARISON:  None. FINDINGS: LOWER CHEST: Lung bases are clear. Included heart size is normal. Mild coronary artery calcifications. No pericardial effusion. HEPATOBILIARY: Liver and gallbladder are normal. PANCREAS: Normal. SPLEEN: Subcentimeter probable cyst, otherwise unremarkable. ADRENALS/URINARY TRACT: Kidneys are orthotopic, demonstrating symmetric enhancement. No nephrolithiasis, or solid renal masses. 16 mm RIGHT interpolar cyst. Multifocal RIGHT greater than LEFT cortical scarring. Mild RIGHT hydroureteronephrosis. Delayed imaging through the kidneys demonstrates symmetric prompt contrast excretion within the proximal urinary collecting system. Urinary bladder is well distended and unremarkable. Normal adrenal glands. STOMACH/BOWEL: Mild distal stomach wall thickening and edema. Spurious appearance of intraluminal gas within the gastric antrum is seen intraluminal on sagittal reformations. The small and large bowel are normal in course and caliber without inflammatory changes. Mild amount of retained large bowel stool. Normal appendix. VASCULAR/LYMPHATIC: Aortoiliac vessels are normal in course and caliber, mild calcific atherosclerosis. No lymphadenopathy by CT size criteria. REPRODUCTIVE: Status post hysterectomy. OTHER: No intraperitoneal free fluid or free air. Phleboliths in the pelvis. MUSCULOSKELETAL: Nonacute. Status post RIGHT mastectomy. Grade 1 L5-S1  anterolisthesis associated with severe degenerative disc, including vacuum disc resulting in severe L4-5 neural foraminal narrowing and moderate to severe canal stenosis. Anterior pelvic wall scarring. IMPRESSION: Mild suspected gastritis. Mild RIGHT hydroureteronephrosis, this can be seen with urinary tract infection, recently passed stone or reflux. Grade 1 L4-5 anterolisthesis on degenerative basis resulting in severe L4-5 neural  foraminal narrowing and moderate to severe canal stenosis. Electronically Signed   By: Elon Alas M.D.   On: 02/19/2016 15:21   Dg Chest Portable 1 View  Result Date: 02/19/2016 CLINICAL DATA:  Altered mental status EXAM: PORTABLE CHEST 1 VIEW COMPARISON:  December 19, 2013 FINDINGS: There is no edema or consolidation. Heart size and pulmonary vascularity are normal. No adenopathy. There are surgical clips in the right axillary region. There is degenerative change in the left shoulder. IMPRESSION: No edema or consolidation. Electronically Signed   By: Lowella Grip III M.D.   On: 02/19/2016 13:57    Procedures Procedures (including critical care time)  Medications Ordered in ED Medications  fentaNYL (SUBLIMAZE) injection 50 mcg (not administered)  ondansetron (ZOFRAN) 4 MG/2ML injection (4 mg  Given 02/19/16 1343)  iopamidol (ISOVUE-300) 61 % injection (100 mLs  Contrast Given 02/19/16 1442)  sodium chloride 0.9 % bolus 1,000 mL (1,000 mLs Intravenous New Bag/Given 02/19/16 1604)  sodium chloride 0.9 % bolus 1,000 mL (1,000 mLs Intravenous New Bag/Given 02/19/16 1604)  cefTRIAXone (ROCEPHIN) 1 g in dextrose 5 % 50 mL IVPB (1 g Intravenous New Bag/Given 02/19/16 1613)  ketorolac (TORADOL) 30 MG/ML injection 15 mg (15 mg Intravenous Given 02/19/16 1617)     Initial Impression / Assessment and Plan / ED Course  I have reviewed the triage vital signs and the nursing notes.  Pertinent labs & imaging results that were available during my care of the patient were  reviewed by me and considered in my medical decision making (see chart for details).  Clinical Course as of Feb 19 1723  Mon Feb 19, 2016  1336 Not saying much but weak movement in all 4 extremities and is currently awake. Appears to be dry heaving.  [SG]    Clinical Course User Index [SG] Sherwood Gambler, MD    Patient's mental status has significantly improved throughout her ER stay. No clear etiology as to why she was so altered on arrival. It seems that she may have passed out or possibly had an unwitnessed seizure. Family states that she's been having vomiting and diarrhea over the last couple days. She's also been having abdominal pain. CT does not show any obvious acute pathology except for possible right hydroureteronephrosis. However no obvious UTI on UA. No urinary symptoms. Possibly a passed kidney stone. Possibly unrelated she might just have gastroenteritis. However given possible syncope versus new onset seizure, plan to admit for observation on telemetry and fluids. IM teaching service to admit.  Final Clinical Impressions(s) / ED Diagnoses   Final diagnoses:  Altered mental status, unspecified altered mental status type    New Prescriptions New Prescriptions   No medications on file     Sherwood Gambler, MD 02/19/16 1725

## 2016-02-19 NOTE — ED Notes (Signed)
Admitting at bedside 

## 2016-02-19 NOTE — ED Triage Notes (Signed)
Pt to ER from home where family activated EMS for patient being unresponsive. Apparently family spoke with patient on the phone at 12:30 and said she was "normal" but reported she did not feel well. Family arrived to patient residence approximately 15 minutes later only to find patient unresponsive on the front porch. EMS gave 2 mg narcan in route for possible overdose, no response. On arrival patient is opening eyes and able to follow some commands but seems aphasic. EMS vitals 170/100, HR 110 ST, 100% on NRB. Dry heaving on arrival as well, given 4 mg zofran per MD Regenia Skeeter. Medical hx of diabetes, CBG 236.

## 2016-02-19 NOTE — ED Notes (Signed)
Pt grimaces on palpation to abdomen. Pt daughter at bedside at this time reports patient did not seem well yesterday, reports "she was stumbling around and throwing up."

## 2016-02-19 NOTE — ED Notes (Signed)
Unable to change time, in and out cath done at [facility]

## 2016-02-20 ENCOUNTER — Encounter (HOSPITAL_COMMUNITY): Payer: Self-pay | Admitting: General Practice

## 2016-02-20 DIAGNOSIS — Z79899 Other long term (current) drug therapy: Secondary | ICD-10-CM

## 2016-02-20 DIAGNOSIS — R1031 Right lower quadrant pain: Secondary | ICD-10-CM | POA: Diagnosis not present

## 2016-02-20 DIAGNOSIS — E872 Acidosis: Secondary | ICD-10-CM

## 2016-02-20 DIAGNOSIS — Z794 Long term (current) use of insulin: Secondary | ICD-10-CM

## 2016-02-20 DIAGNOSIS — R55 Syncope and collapse: Secondary | ICD-10-CM | POA: Diagnosis not present

## 2016-02-20 DIAGNOSIS — Z880 Allergy status to penicillin: Secondary | ICD-10-CM

## 2016-02-20 DIAGNOSIS — N1330 Unspecified hydronephrosis: Secondary | ICD-10-CM | POA: Diagnosis not present

## 2016-02-20 DIAGNOSIS — R338 Other retention of urine: Secondary | ICD-10-CM | POA: Diagnosis not present

## 2016-02-20 DIAGNOSIS — Z888 Allergy status to other drugs, medicaments and biological substances status: Secondary | ICD-10-CM

## 2016-02-20 DIAGNOSIS — Z87448 Personal history of other diseases of urinary system: Secondary | ICD-10-CM | POA: Diagnosis not present

## 2016-02-20 DIAGNOSIS — E039 Hypothyroidism, unspecified: Secondary | ICD-10-CM

## 2016-02-20 DIAGNOSIS — E1142 Type 2 diabetes mellitus with diabetic polyneuropathy: Secondary | ICD-10-CM | POA: Diagnosis not present

## 2016-02-20 DIAGNOSIS — F329 Major depressive disorder, single episode, unspecified: Secondary | ICD-10-CM

## 2016-02-20 DIAGNOSIS — D72829 Elevated white blood cell count, unspecified: Secondary | ICD-10-CM | POA: Diagnosis not present

## 2016-02-20 DIAGNOSIS — Z882 Allergy status to sulfonamides status: Secondary | ICD-10-CM

## 2016-02-20 LAB — INFLUENZA PANEL BY PCR (TYPE A & B)
Influenza A By PCR: NEGATIVE
Influenza B By PCR: NEGATIVE

## 2016-02-20 LAB — GLUCOSE, CAPILLARY
Glucose-Capillary: 234 mg/dL — ABNORMAL HIGH (ref 65–99)
Glucose-Capillary: 178 mg/dL — ABNORMAL HIGH (ref 65–99)

## 2016-02-20 LAB — CBC
HCT: 34.4 % — ABNORMAL LOW (ref 36.0–46.0)
Hemoglobin: 11.9 g/dL — ABNORMAL LOW (ref 12.0–15.0)
MCH: 30.6 pg (ref 26.0–34.0)
MCHC: 34.6 g/dL (ref 30.0–36.0)
MCV: 88.4 fL (ref 78.0–100.0)
Platelets: 239 10*3/uL (ref 150–400)
RBC: 3.89 MIL/uL (ref 3.87–5.11)
RDW: 13.2 % (ref 11.5–15.5)
WBC: 9.7 10*3/uL (ref 4.0–10.5)

## 2016-02-20 LAB — BASIC METABOLIC PANEL
Anion gap: 9 (ref 5–15)
BUN: 12 mg/dL (ref 6–20)
CO2: 25 mmol/L (ref 22–32)
Calcium: 9.3 mg/dL (ref 8.9–10.3)
Chloride: 104 mmol/L (ref 101–111)
Creatinine, Ser: 0.92 mg/dL (ref 0.44–1.00)
GFR calc Af Amer: >60 mL/min (ref >60)
GFR calc non Af Amer: >60 mL/min (ref >60)
Glucose, Bld: 194 mg/dL — ABNORMAL HIGH (ref 65–99)
Potassium: 3.5 mmol/L (ref 3.5–5.1)
Sodium: 138 mmol/L (ref 135–145)

## 2016-02-20 LAB — CBG MONITORING, ED
Glucose-Capillary: 146 mg/dL — ABNORMAL HIGH (ref 65–99)
Glucose-Capillary: 169 mg/dL — ABNORMAL HIGH (ref 65–99)

## 2016-02-20 LAB — LACTIC ACID, PLASMA: Lactic Acid, Venous: 1.1 mmol/L (ref 0.5–1.9)

## 2016-02-20 MED ORDER — FAMOTIDINE 20 MG PO TABS
20.0000 mg | ORAL_TABLET | ORAL | Status: DC
  Administered 2016-02-20 – 2016-02-21 (×2): 20 mg via ORAL
  Filled 2016-02-20 (×2): qty 1

## 2016-02-20 NOTE — Progress Notes (Signed)
Subjective: Feels much improved today, with minimal abdominal pain, less weakness, feels alert and without confusion.  Per family at bedside, mental status is normal.  Was unable to urinate yesterday, requiring intermittent catheterization (650 mL drained).  Voided this morning on bedside commode with some mild dysuria when initiating the stream.  At home, says she urinates 1-2x per day and drinks 4-5 bottles of water daily.  No stress incontinence, occasional urge incontinence.  Cystoscopy record from 2002 ordered due to R flank pain and R hydronephrosis and ureterocele.  Objective:  Vital signs in last 24 hours: Vitals:   02/20/16 0545 02/20/16 0600 02/20/16 0700 02/20/16 0715  BP: 128/70 122/72 142/73 126/70  Pulse: 78 84 93 78  Resp: 12 17 17 10   Temp:      TempSrc:      SpO2: 100% 100% 100% 100%   Bladder scan >999 mL, voided ~900 mL, then PVR 431 mL  Physical Exam  Constitutional: She is oriented to person, place, and time.  Elderly woman sitting on edge of bed in no distress  HENT:  Head: Normocephalic and atraumatic.  Cardiovascular: Normal rate, regular rhythm and normal heart sounds.   Pulmonary/Chest: Effort normal and breath sounds normal. No respiratory distress.  Abdominal:  Mild suprapubic tenderness to palpation  Neurological: She is alert and oriented to person, place, and time.  Psychiatric: She has a normal mood and affect. Her behavior is normal.   CBC Latest Ref Rng & Units 02/20/2016 02/19/2016 09/09/2014  WBC 4.0 - 10.5 K/uL 9.7 11.8(H) -  Hemoglobin 12.0 - 15.0 g/dL 11.9(L) 12.7 11.9(L)  Hematocrit 36.0 - 46.0 % 34.4(L) 37.1 35.0(L)  Platelets 150 - 400 K/uL 239 245 -   CMP Latest Ref Rng & Units 02/20/2016 02/19/2016 02/19/2016  Glucose 65 - 99 mg/dL 194(H) - 265(H)  BUN 6 - 20 mg/dL 12 - 17  Creatinine 0.44 - 1.00 mg/dL 0.92 0.80 1.00  Sodium 135 - 145 mmol/L 138 - 138  Potassium 3.5 - 5.1 mmol/L 3.5 - 4.1  Chloride 101 - 111 mmol/L 104 - 101  CO2  22 - 32 mmol/L 25 - 25  Calcium 8.9 - 10.3 mg/dL 9.3 - 9.9  Total Protein 6.5 - 8.1 g/dL - - 7.6  Total Bilirubin 0.3 - 1.2 mg/dL - - 1.2  Alkaline Phos 38 - 126 U/L - - 64  AST 15 - 41 U/L - - 35  ALT 14 - 54 U/L - - 16   Lab Results  Component Value Date   HGBA1C 11.7 12/01/2015    Influenza negative   Assessment/Plan:  Active Problems:   Hypothyroidism   Type 2 diabetes mellitus with diabetic polyneuropathy, with long-term current use of insulin ([facility])   Syncope   Abdominal pain   Acute urinary retention  66 y.o. female with acute abdominal pain, nausea/vomiting, and CVA tenderness with mild leukocytosis and hypothermia concerning for infection.  Overall, her clinical picture is concerning for nephrolithiasis or pyelonephritis, though her UA does not strongly suggest either.  Her abdomen is benign, though she could have a gastroenteritis.  Will begin empiric antibiotics for pyelonephritic and give supportive care, monitor for urinary retention.  #Acute Urinary Retention Difficulty voiding and distended bladder, and also mild hydronephrosis on CT scan (though per cystoscopy report from 2002 it seems this has been present for 15+ years).  Could be medication effect vs obstruction from pelvic organ prolapse.  No medications other then diphenhydramine on home meds that could cause  retention.  Her history of poorly controlled diabetes (last A1c 11.7) could cause retention with autonomic neuropathy. -PVRs -avoid anticholinergic meds -Consider urogynecology/urology referral  #Abdominal/Suprapubic Pain #Right CVA Tenderness -continue empiric ceftriaxone -toradol and tylenol PRN for pain -Zofran PRN -IVF  #Mild Leukocytosis qSOFA 2/3, SIRS 3/4, concern for infection.  May be related to GI/GU complaints. -Follow CBC  #Syncope Most likely orthostasis in context of acute illness and poor oral intake.  Nonfocal neurologic exam, normal head CT, unlikely seizure or stoke  Orthostatic  vital signs unable to be obtained in the ED before volume resuscitation.  No signs of trauma, no rhabdomyolysis.  -orthostatics  #DM Last A1c 11.7. -SSI  #Lactic Acidosis Resolved with IVF.  Fluids: NS 100 mL/hr Diet: carb modified DVT Prophylaxis: lovenox Code Status: full  Dispo: Anticipated discharge in approximately 1-2 day(s).   Minus Liberty, MD 02/20/2016, 7:30 AM Pager: [phone]

## 2016-02-20 NOTE — ED Notes (Signed)
Patient's PVR scanned for 442ml

## 2016-02-20 NOTE — ED Notes (Signed)
Bladder scanner read greater than 930ml

## 2016-02-20 NOTE — H&P (Signed)
Internal Medicine Attending Admission Note  I saw and evaluated the patient. I reviewed the resident's note and I agree with the resident's findings and plan as documented in the resident's note.  Assessment & Plan by Problem:   Possible Syncope, most likely vasovagal due to pain versus orthostatic - Patient with possible syncopal episdoe in the setting of right flank pain, possible Nephrolithiasis.  Patient also had nausea and poor oral intake, unfortuantely orthostatics were not collected, EKG reassuring. - Monitor, no further workup at this time.  Right flank pain, hydrouretonephrosis,  Acute urinary retention - Most likely 2/2 nephrolithiasis, cannot completely exclude infection but less likely given UA.  Oddly she had some some degree of hydroureteronehrosis on the right side in 2002 which was evalauted with a cystocoscopy.  And also concerning is her urinary retention.  She may need a non contrasted CT of the abdomen and pelvis to evaluate for nephrolithasis of the ureter or bladder versus urology consultation. -Continue ceftiaxone for now    Type 2 diabetes mellitus with diabetic polyneuropathy, with long-term current use of insulin ([place]) -SSI   Chief Complaint(s):passed out  History - key components related to admission: Briefly [name] is a 66 yo female with PMH of DM, HTN, CKD who presented with right side abdominal pain, nausea, vomiting, malaise and syncope versus near syncope. She reports she was feeling unwell on Sunday with pain in her right lower abdomen that traveled to her back that was associated with nausea, decreased appetitie.  She decided to make it to church however her sister reports that was not a good idea because she did not "look good," once she returned home she attepted to rest however the pain continued and she had some episodes of vomiting.  She called her sister in law to tell her that she did not feel well and wanted to go the the hosptial.  By the time the  sister in law arrived she had moved to the porch (due to having dogs that dont like the sister in law) and was found laying possibly unresponsive.  She was moved and then she started groaning.  Sister in law reports she was not her self until a few hours after being in the hospital, she also feels like her jaw was possible deviated to the left and was concerned for stroke at the time.  In the ED CT of her head and neck was unremarkable,  Ct of the Abd pelvis with contrast revealed mild right hydroureteronephrosis as well as chronic chronic degenerative changes of the spine.  [name] subsequently improved with IV hydration.  She reports she currently feels much better, she is still having some right flank pain.  Lab results: Reviewed in Epic  Physical Exam - key components related to admission: General: resting in bed no distress HEENT:  EOMI, no scleral icterus Cardiac: RRR, no rubs, murmurs or gallops Pulm: clear to auscultation bilaterally, moving normal volumes of air Abd: soft, mild pubic tenderness, nondistended, BS present, right CVA tenderness Ext: warm and well perfused, no pedal edema Neuro: alert and oriented X3, cranial nerves II-XII grossly intact   Vitals:   02/20/16 1400 02/20/16 1444 02/20/16 1445 02/20/16 1652  BP: 126/73  110/59 (!) 112/58  Pulse: 86  83 94  Resp: 15  17 18   Temp:  98.1 F (36.7 C)  99.1 F (37.3 C)  TempSrc:  Oral  Oral  SpO2: 100%  97% 99%

## 2016-02-20 NOTE — ED Notes (Signed)
cbg was 169

## 2016-02-20 NOTE — Progress Notes (Addendum)
New Admission Note:   Arrival Method: Stretcher from ED  Mental Orientation: alert and orientated x4  Telemetry: Box 10  Assessment: Completed Skin: See Flowsheet [id number] FA NSL  Pain:Denies  Tubes: none  Safety Measures: Safety Fall Prevention Plan has been  discussed Admission: Completed [facility] Orientation: Patient has been orientated to the room, unit and staff.  Family: at bedside   Orders have been reviewed and implemented. Will continue to monitor the patient. Call light has been placed within reach and bed alarm has been activated.   Emilio Math, RN [facility]  Phone number: [phone]

## 2016-02-21 DIAGNOSIS — E876 Hypokalemia: Secondary | ICD-10-CM

## 2016-02-21 DIAGNOSIS — Z882 Allergy status to sulfonamides status: Secondary | ICD-10-CM | POA: Diagnosis not present

## 2016-02-21 DIAGNOSIS — R338 Other retention of urine: Secondary | ICD-10-CM | POA: Diagnosis not present

## 2016-02-21 DIAGNOSIS — E1142 Type 2 diabetes mellitus with diabetic polyneuropathy: Secondary | ICD-10-CM | POA: Diagnosis not present

## 2016-02-21 DIAGNOSIS — R55 Syncope and collapse: Secondary | ICD-10-CM | POA: Diagnosis not present

## 2016-02-21 DIAGNOSIS — Z888 Allergy status to other drugs, medicaments and biological substances status: Secondary | ICD-10-CM | POA: Diagnosis not present

## 2016-02-21 DIAGNOSIS — Z880 Allergy status to penicillin: Secondary | ICD-10-CM | POA: Diagnosis not present

## 2016-02-21 DIAGNOSIS — N1330 Unspecified hydronephrosis: Secondary | ICD-10-CM | POA: Diagnosis not present

## 2016-02-21 DIAGNOSIS — Z79899 Other long term (current) drug therapy: Secondary | ICD-10-CM | POA: Diagnosis not present

## 2016-02-21 LAB — CBC
HCT: 28.2 % — ABNORMAL LOW (ref 36.0–46.0)
Hemoglobin: 9.5 g/dL — ABNORMAL LOW (ref 12.0–15.0)
MCH: 30.2 pg (ref 26.0–34.0)
MCHC: 33.7 g/dL (ref 30.0–36.0)
MCV: 89.5 fL (ref 78.0–100.0)
Platelets: 228 10*3/uL (ref 150–400)
RBC: 3.15 MIL/uL — ABNORMAL LOW (ref 3.87–5.11)
RDW: 12.9 % (ref 11.5–15.5)
WBC: 5.5 10*3/uL (ref 4.0–10.5)

## 2016-02-21 LAB — BASIC METABOLIC PANEL
Anion gap: 4 — ABNORMAL LOW (ref 5–15)
Anion gap: 7 (ref 5–15)
BUN: 8 mg/dL (ref 6–20)
BUN: 10 mg/dL (ref 6–20)
CO2: 24 mmol/L (ref 22–32)
CO2: 25 mmol/L (ref 22–32)
Calcium: 7.6 mg/dL — ABNORMAL LOW (ref 8.9–10.3)
Calcium: 8.9 mg/dL (ref 8.9–10.3)
Chloride: 113 mmol/L — ABNORMAL HIGH (ref 101–111)
Chloride: 105 mmol/L (ref 101–111)
Creatinine, Ser: 0.71 mg/dL (ref 0.44–1.00)
Creatinine, Ser: 0.86 mg/dL (ref 0.44–1.00)
GFR calc Af Amer: >60 mL/min (ref >60)
GFR calc Af Amer: >60 mL/min (ref >60)
GFR calc non Af Amer: >60 mL/min (ref >60)
GFR calc non Af Amer: >60 mL/min (ref >60)
Glucose, Bld: 107 mg/dL — ABNORMAL HIGH (ref 65–99)
Glucose, Bld: 177 mg/dL — ABNORMAL HIGH (ref 65–99)
Potassium: 2.7 mmol/L — CL (ref 3.5–5.1)
Potassium: 3.8 mmol/L (ref 3.5–5.1)
Sodium: 141 mmol/L (ref 135–145)
Sodium: 137 mmol/L (ref 135–145)

## 2016-02-21 LAB — GLUCOSE, CAPILLARY
Glucose-Capillary: 126 mg/dL — ABNORMAL HIGH (ref 65–99)
Glucose-Capillary: 276 mg/dL — ABNORMAL HIGH (ref 65–99)
Glucose-Capillary: 129 mg/dL — ABNORMAL HIGH (ref 65–99)

## 2016-02-21 LAB — HEMOGLOBIN A1C
Hgb A1c MFr Bld: 8.7 % — ABNORMAL HIGH (ref 4.8–5.6)
Mean Plasma Glucose: 203 mg/dL

## 2016-02-21 LAB — MAGNESIUM: Magnesium: 1.5 mg/dL — ABNORMAL LOW (ref 1.7–2.4)

## 2016-02-21 MED ORDER — MAGNESIUM CHLORIDE 64 MG PO TBEC
2.0000 | DELAYED_RELEASE_TABLET | Freq: Once | ORAL | Status: AC
  Administered 2016-02-21: 128 mg via ORAL
  Filled 2016-02-21 (×2): qty 2

## 2016-02-21 MED ORDER — SODIUM CHLORIDE 0.9 % IV SOLN
30.0000 meq | Freq: Once | INTRAVENOUS | Status: AC
  Administered 2016-02-21: 30 meq via INTRAVENOUS
  Filled 2016-02-21: qty 15

## 2016-02-21 MED ORDER — POTASSIUM CHLORIDE 2 MEQ/ML IV SOLN
30.0000 meq | Freq: Once | INTRAVENOUS | Status: DC
  Filled 2016-02-21: qty 15

## 2016-02-21 MED ORDER — POTASSIUM CHLORIDE 20 MEQ/15ML (10%) PO SOLN
40.0000 meq | Freq: Once | ORAL | Status: AC
  Administered 2016-02-21: 40 meq via ORAL
  Filled 2016-02-21: qty 30

## 2016-02-21 MED ORDER — MAGNESIUM OXIDE 400 (241.3 MG) MG PO TABS: 800.0000 mg | ORAL_TABLET | Freq: Once | ORAL | Status: DC

## 2016-02-21 NOTE — Progress Notes (Signed)
Internal Medicine Attending:   I saw and examined the patient. I reviewed the resident's note and I agree with the resident's findings and plan as documented in the resident's note. Patient reports right flank pain and abdominal pain much improved today,  Per nursing post void risdual was 100cc.  She has not spiked fevers, WBC has returned to normal.  Therefore we will discontinue Ceftriaxone.  Her Acute Urinary retention seems to be improved, unclear at this time what is causing that>> possibly autonomic neuropathy due to longstanding uncontrolled DM.  Regardless given her repeat finding of right hydroureteronephrosis we will have her follow up with Urology as an outpatient. This AM she was also found to be Hypokalemic with Potassium of 2.7.  This has been replaced via IV and PO, We have a pending BMP to reevaluate this and then she can be discharged.

## 2016-02-21 NOTE — Progress Notes (Signed)
CRITICAL VALUE STICKER  CRITICAL VALUE: Potassium 2.7   RECEIVER (on-site recipient of call):[name]  DATE & TIME NOTIFIED: 0802 02/21/16  MESSENGER (representative from lab):  MD NOTIFIED:  Internal medicine pager   TIME OF NOTIFICATION: 0802  RESPONSE:

## 2016-02-21 NOTE — Discharge Instructions (Signed)
You were admitted to the hospital after feeling sick and passing out.  We're not sure exactly what happened, but you got much better and we did not find evidence of anything dangerous that caused it, including an infection.  We think this could be due to either your kidneys or bladder, either a stone or troubles with your bladder holding on to urine.  Please follow-up with Dr Dareen Piano next week.  Please keep track of how much you are drinking and how many times per day you urinate.  If you pass out, fall down, have fevers, get worse pain, or can't urinate, please come back to the ED.

## 2016-02-21 NOTE — Progress Notes (Signed)
Subjective: No acute events overnight.  Able to urinate this morning, but feels like she had to strain some to start her stream.  Feels like her normal self, and is ready to go home.  Objective:  Vital signs in last 24 hours: Vitals:   02/20/16 1445 02/20/16 1652 02/20/16 2237 02/21/16 0434  BP: 110/59 (!) 112/58 (!) 118/54 (!) 108/57  Pulse: 83 94 86 74  Resp: 17 18 18 17   Temp:  99.1 F (37.3 C) 98.4 F (36.9 C) 97.8 F (36.6 C)  TempSrc:  Oral Oral Oral  SpO2: 97% 99% 100% 99%   02/20/2016 Bladder scan >999 mL, voided ~900 mL, then PVR 431 mL  02/21/2016 PVR 147 mL  Physical Exam  Constitutional: She is oriented to person, place, and time.  Elderly woman sitting in chair in no distress  HENT:  Head: Normocephalic and atraumatic.  Cardiovascular: Normal rate, regular rhythm and normal heart sounds.   Pulmonary/Chest: Effort normal and breath sounds normal. No respiratory distress.  Abdominal:  Mild suprapubic tenderness to palpation  Neurological: She is alert and oriented to person, place, and time.  Psychiatric: She has a normal mood and affect. Her behavior is normal.   CBC Latest Ref Rng & Units 02/21/2016 02/20/2016 02/19/2016  WBC 4.0 - 10.5 K/uL 5.5 9.7 11.8(H)  Hemoglobin 12.0 - 15.0 g/dL 9.5(L) 11.9(L) 12.7  Hematocrit 36.0 - 46.0 % 28.2(L) 34.4(L) 37.1  Platelets 150 - 400 K/uL 228 239 245   CMP Latest Ref Rng & Units 02/21/2016 02/20/2016 02/19/2016  Glucose 65 - 99 mg/dL 107(H) 194(H) -  BUN 6 - 20 mg/dL 8 12 -  Creatinine 0.44 - 1.00 mg/dL 0.71 0.92 0.80  Sodium 135 - 145 mmol/L 141 138 -  Potassium 3.5 - 5.1 mmol/L 2.7(LL) 3.5 -  Chloride 101 - 111 mmol/L 113(H) 104 -  CO2 22 - 32 mmol/L 24 25 -  Calcium 8.9 - 10.3 mg/dL 7.6(L) 9.3 -  Total Protein 6.5 - 8.1 g/dL - - -  Total Bilirubin 0.3 - 1.2 mg/dL - - -  Alkaline Phos 38 - 126 U/L - - -  AST 15 - 41 U/L - - -  ALT 14 - 54 U/L - - -   Lab Results  Component Value Date   HGBA1C 8.7 (H)  02/20/2016    Influenza negative   Assessment/Plan:  Active Problems:   Hypothyroidism   Type 2 diabetes mellitus with diabetic polyneuropathy, with long-term current use of insulin ([facility])   Syncope   Abdominal pain   Acute urinary retention  66 y.o. female with acute suprapubic pain, nausea/vomiting, and syncope found to have urinary retention.  Her clinical picture is unclear, but could have urinary retention from diabetic autonomic neuropathy.  Her syncope could have been vasovagal from retention or nephrolithiasis, or orthostasis.  She is now back to baseline and voiding without assistance.  #Acute Urinary Retention Difficulty voiding and distended bladder, and also mild hydronephrosis on CT scan (though per cystoscopy report from 2002 it seems this has been present for 15+ years).  Could be medication effect vs obstruction from pelvic organ prolapse.  No medications other then diphenhydramine on home meds that could cause retention.  Her history of poorly controlled diabetes (A1c 11.7 in 11/2015) could cause retention with autonomic neuropathy. -PVRs -avoid anticholinergic meds -Outpatient urogynecology/urology follow-up  #Abdominal/Suprapubic Pain #Right CVA Tenderness Potentially related to acute urinary retention, or nephrolithiasis, sterile pyelonephritis. -discontinue antibiotics -toradol and tylenol PRN for pain -  Zofran PRN -IVF  #Hypokalemia K 3.5 -> 2.7 -Replete K -Check BMP in afternoon   #Syncope No further syncope, dizziness, or presyncopal symptoms.  Most likely orthostasis in context of acute illness and poor oral intake, possibly vasovagal from pain or bladder distention.  Nonfocal neurologic exam, normal head CT, unlikely seizure or stoke  Orthostatic vital signs unable to be obtained in the ED before volume resuscitation.  No signs of trauma, no rhabdomyolysis.  #Mild Leukocytosis Resolved.  Given 2 days of empiric ceftriaxone, but she improved clinical  and no evidence of infection emerged. -Follow CBC  #DM A1c 11/2015 11.7, down to 8.7 now. -SSI  #Lactic Acidosis Resolved with IVF.  Fluids: NS 100 mL/hr Diet: carb modified DVT Prophylaxis: lovenox Code Status: full  Dispo: Anticipated discharge today.  Minus Liberty, MD 02/21/2016, 7:11 AM Pager: [phone]

## 2016-02-21 NOTE — Progress Notes (Signed)
[place] to be D/C'd Home per MD order.  Discussed prescriptions and follow up appointments with the patient. Prescriptions given to patient, medication list explained in detail. Pt verbalized understanding.  Allergies as of 02/21/2016      Reactions   Gabapentin Other (See Comments)   Dizziness from 300 mg, tolerates 100 mg   Penicillins Rash   Sulfonamide Derivatives Rash      Medication List    STOP taking these medications   diphenhydrAMINE 25 mg capsule Commonly known as:  BENADRYL     TAKE these medications   acetaminophen 325 MG tablet Commonly known as:  TYLENOL Take 325 mg by mouth every 6 (six) hours as needed.   amLODipine 10 MG tablet Commonly known as:  NORVASC Take 1 tablet (10 mg total) by mouth daily.   aspirin EC 81 MG tablet Take 81 mg by mouth at bedtime.   cycloSPORINE 0.05 % ophthalmic emulsion Commonly known as:  RESTASIS Place 1 drop into both eyes 2 (two) times daily.   glucose 4 GM chewable tablet Chew 4 tablets (16 g total) by mouth as needed for low blood sugar.   hydrochlorothiazide 12.5 MG capsule Commonly known as:  MICROZIDE TAKE 1 CAPSULE BY MOUTH EVERY DAY   Insulin Detemir 100 UNIT/ML Pen Commonly known as:  LEVEMIR FLEXTOUCH Inject 15 Units into the skin daily at 10 pm.   insulin lispro 100 UNIT/ML KiwkPen Commonly known as:  HUMALOG KWIKPEN Take 3-4U three times a day before meals.   levothyroxine 75 MCG tablet Commonly known as:  SYNTHROID, LEVOTHROID TAKE 1 TABLET BY MOUTH DAILY BEFORE BREAKFAST   liraglutide 18 MG/3ML Sopn Inject 0.3 mLs (1.8 mg total) into the skin daily.   meloxicam 15 MG tablet Commonly known as:  MOBIC Take 15 mg by mouth daily.   metFORMIN 1000 MG tablet Commonly known as:  GLUCOPHAGE Take 1 tablet (1,000 mg total) by mouth 2 (two) times daily with a meal.   potassium chloride 20 MEQ/15ML (10%) Soln Take 15 mLs (20 mEq total) by mouth daily as needed (for muscle cramps).   pravastatin 40  MG tablet Commonly known as:  PRAVACHOL TAKE 1 TABLET BY MOUTH DAILY   pregabalin 50 MG capsule Commonly known as:  LYRICA Take 1 capsule (50 mg total) by mouth 3 (three) times daily.   REFRESH OP Place 1 drop into both eyes 3 (three) times daily.   venlafaxine XR 150 MG 24 hr capsule Commonly known as:  EFFEXOR-XR TAKE 1 CAPSULE(150 MG) BY MOUTH DAILY WITH BREAKFAST   Vitamin D-3 1000 units Caps Take 2,000 Units by mouth daily.       Vitals:   02/21/16 0913 02/21/16 1721  BP: 116/60 (!) 115/52  Pulse: 79 88  Resp: 18 17  Temp: 97.8 F (36.6 C) 99 F (37.2 C)    Skin clean, dry and intact without evidence of skin break down, no evidence of skin tears noted. IV catheter discontinued intact. Site without signs and symptoms of complications. Dressing and pressure applied. Pt denies pain at this time. No complaints noted.  An After Visit Summary was printed and given to the patient. Patient escorted via [place], and D/C home via private auto.  Emilio Math, RN [facility] Phone [phone]

## 2016-02-21 NOTE — Discharge Summary (Signed)
Name: [name] MRN: [id number] DOB: December 31, 1950 66 y.o. PCP: Aldine Contes, MD  Date of Admission: 02/19/2016  1:30 PM Date of Discharge: 02/21/2016 Attending Physician: Lucious Groves, DO  Discharge Diagnosis:  Principal Problem:   Syncope Active Problems:   Hypothyroidism   Type 2 diabetes mellitus with diabetic polyneuropathy, with long-term current use of insulin ([place])   Abdominal pain   Acute urinary retention  Discharge Medications: Allergies as of 02/21/2016      Reactions   Gabapentin Other (See Comments)   Dizziness from 300 mg, tolerates 100 mg   Penicillins Rash   Sulfonamide Derivatives Rash      Medication List    STOP taking these medications   diphenhydrAMINE 25 mg capsule Commonly known as:  BENADRYL     TAKE these medications   acetaminophen 325 MG tablet Commonly known as:  TYLENOL Take 325 mg by mouth every 6 (six) hours as needed.   amLODipine 10 MG tablet Commonly known as:  NORVASC Take 1 tablet (10 mg total) by mouth daily.   aspirin EC 81 MG tablet Take 81 mg by mouth at bedtime.   cycloSPORINE 0.05 % ophthalmic emulsion Commonly known as:  RESTASIS Place 1 drop into both eyes 2 (two) times daily.   glucose 4 GM chewable tablet Chew 4 tablets (16 g total) by mouth as needed for low blood sugar.   hydrochlorothiazide 12.5 MG capsule Commonly known as:  MICROZIDE TAKE 1 CAPSULE BY MOUTH EVERY DAY   Insulin Detemir 100 UNIT/ML Pen Commonly known as:  LEVEMIR FLEXTOUCH Inject 15 Units into the skin daily at 10 pm.   insulin lispro 100 UNIT/ML KiwkPen Commonly known as:  HUMALOG KWIKPEN Take 3-4U three times a day before meals.   levothyroxine 75 MCG tablet Commonly known as:  SYNTHROID, LEVOTHROID TAKE 1 TABLET BY MOUTH DAILY BEFORE BREAKFAST   liraglutide 18 MG/3ML Sopn Inject 0.3 mLs (1.8 mg total) into the skin daily.   meloxicam 15 MG tablet Commonly known as:  MOBIC Take 15 mg by mouth daily.   metFORMIN 1000  MG tablet Commonly known as:  GLUCOPHAGE Take 1 tablet (1,000 mg total) by mouth 2 (two) times daily with a meal.   potassium chloride 20 MEQ/15ML (10%) Soln Take 15 mLs (20 mEq total) by mouth daily as needed (for muscle cramps).   pravastatin 40 MG tablet Commonly known as:  PRAVACHOL TAKE 1 TABLET BY MOUTH DAILY   pregabalin 50 MG capsule Commonly known as:  LYRICA Take 1 capsule (50 mg total) by mouth 3 (three) times daily.   REFRESH OP Place 1 drop into both eyes 3 (three) times daily.   venlafaxine XR 150 MG 24 hr capsule Commonly known as:  EFFEXOR-XR TAKE 1 CAPSULE(150 MG) BY MOUTH DAILY WITH BREAKFAST   Vitamin D-3 1000 units Caps Take 2,000 Units by mouth daily.       Disposition and follow-up:   [name] was discharged from [facility] in Stable condition.  At the hospital follow up visit please address:  1.  Syncope.  Presented after being found down on her porch.  Uncertain etiology, resolved with observation.  Ask about syncope/presyncopal symptoms, constitutional symptoms of infections, dizziness.   2.  Urinary retention.  Ask about voiding habits and symptoms.  Bladder scan.  Consider urology referral.  3.  Hypokalemia.  Check K and BMP, supplement as needed.   4.  Labs / imaging needed at time of follow-up: BMP, Mg,  bladder scan, orthostatic vitals  5.  Pending labs/ test needing follow-up: none  Follow-up Appointments: Follow-up Information    NARENDRA, NISCHAL, MD. Go in 6 day(s).   Specialty:  Internal Medicine Why:  at 9:45 Contact information: [place], [place] [id number] [phone]           Hospital Course by problem list: Principal Problem:   Syncope Active Problems:   Hypothyroidism   Type 2 diabetes mellitus with diabetic polyneuropathy, with long-term current use of insulin (HCC)   Abdominal pain   Acute urinary retention   1. Syncope [name] is a 66 year old woman with  history of DM, HTN, and CKD who presented with acute nausea, vomiting, malaise, and suprapubic pain leading up to a possible syncopal event followed by lethargy and confusion for several hours.  She had been feeling sick for 1 day, called a relative to take her to the hospital, and 10 minutes later was found on her porch, minimally responsive, curled up in the fetal position.  She had a normal head CT and BGs in 200s, and mental status returned to baseline with IVF and supportive care.  After her AMS resolved, her neurologic exam was nonfocal and she felt normal.  She did not have a recurrence of symptoms.  The etiology of her event remained elusive, but could be vasovagal related to 2) or 3), or orthostasis secondary to nausea and vomiting.  2. Abdominal/Suprapubic Pain She reported low abdominal pain, and on exam had suprapubic fullness and tenderness and moderate right CVA tenderness.  CT abdomen showed mild right hydroureteronephrosis, which seems to be stable since at least 2002.    She received 2 days of empiric ceftriaxone for possible pyelonephritis despite her clean UA, but did not develop and further symptoms of infection.  Suprapubic and CVA tenderness improved.  3. Urinary Retention She was found to be retaining urine (bladder scan >1L, and PVR ~400 mL) and reported difficulty voiding, having to strain to initiate micturation.  Reports chronically only urinating 1-2 times per day but no symptoms of stress/overflow incontinence, occasional urge incontinence.  She required in and out catheterization twice, but subsequently was able to void on her own.  Review of her medication list did not reveal any likely pharmacologic culprits for her retention, but she was instructed not to take benadryl.  Discharge Vitals:   BP 116/60 (BP Location: Left Arm)   Pulse 79   Temp 97.8 F (36.6 C) (Oral)   Resp 18   SpO2 100%   Pertinent Labs, Studies, and Procedures:  CBC Latest Ref Rng & Units 02/21/2016  02/20/2016 02/19/2016  WBC 4.0 - 10.5 K/uL 5.5 9.7 11.8(H)  Hemoglobin 12.0 - 15.0 g/dL 9.5(L) 11.9(L) 12.7  Hematocrit 36.0 - 46.0 % 28.2(L) 34.4(L) 37.1  Platelets 150 - 400 K/uL 228 239 245   CMP Latest Ref Rng & Units 02/21/2016 02/21/2016 02/20/2016  Glucose 65 - 99 mg/dL 177(H) 107(H) 194(H)  BUN 6 - 20 mg/dL 10 8 12   Creatinine 0.44 - 1.00 mg/dL 0.86 0.71 0.92  Sodium 135 - 145 mmol/L 137 141 138  Potassium 3.5 - 5.1 mmol/L 3.8 2.7(LL) 3.5  Chloride 101 - 111 mmol/L 105 113(H) 104  CO2 22 - 32 mmol/L 25 24 25   Calcium 8.9 - 10.3 mg/dL 8.9 7.6(L) 9.3  Total Protein 6.5 - 8.1 g/dL - - -  Total Bilirubin 0.3 - 1.2 mg/dL - - -  Alkaline Phos 38 - 126  U/L - - -  AST 15 - 41 U/L - - -  ALT 14 - 54 U/L - - -   Component     Latest Ref Rng & Units 02/21/2016  Magnesium     1.7 - 2.4 mg/dL 1.5 (L)   Urinalysis    Component Value Date/Time   COLORURINE YELLOW 02/19/2016 1607   APPEARANCEUR CLEAR 02/19/2016 1607   LABSPEC 1.015 02/19/2016 1607   PHURINE 7.5 02/19/2016 1607   GLUCOSEU >=500 (A) 02/19/2016 1607   HGBUR TRACE (A) 02/19/2016 1607   BILIRUBINUR NEGATIVE 02/19/2016 1607   KETONESUR 15 (A) 02/19/2016 1607   PROTEINUR 30 (A) 02/19/2016 1607   UROBILINOGEN 0.2 04/01/2014 1110   NITRITE NEGATIVE 02/19/2016 1607   LEUKOCYTESUR NEGATIVE 02/19/2016 1607    Component     Latest Ref Rng & Units 02/19/2016 02/19/2016 02/20/2016         1:56 PM  9:34 PM   Lactic Acid, Venous     0.5 - 1.9 mmol/L 2.70 (HH) 1.6 1.1   Lab Results  Component Value Date   HGBA1C 8.7 (H) 02/20/2016   Discharge Instructions: Discharge Instructions    Diet - low sodium heart healthy    Complete by:  As directed    Increase activity slowly    Complete by:  As directed      You were admitted to the hospital after feeling sick and passing out.  We're not sure exactly what happened, but you got much better and we did not find evidence of anything dangerous that caused it, including an infection.   We think this could be due to either your kidneys or bladder, either a stone or troubles with your bladder holding on to urine.  Please follow-up with Dr Dareen Piano next week.  Please keep track of how much you are drinking and how many times per day you urinate.  If you pass out, fall down, have fevers, get worse pain, or can't urinate, please come back to the ED.   Signed: Minus Liberty, MD 02/21/2016, 3:38 PM   Pager: [phone]

## 2016-02-26 ENCOUNTER — Other Ambulatory Visit: Payer: Self-pay | Admitting: Oncology

## 2016-02-27 ENCOUNTER — Encounter: Payer: Medicare Other | Admitting: Dietician

## 2016-02-27 ENCOUNTER — Ambulatory Visit (INDEPENDENT_AMBULATORY_CARE_PROVIDER_SITE_OTHER): Payer: Medicare Other | Admitting: Internal Medicine

## 2016-02-27 ENCOUNTER — Encounter: Payer: Self-pay | Admitting: Internal Medicine

## 2016-02-27 VITALS — BP 140/60 | HR 98 | Temp 98.8°F | Ht 63.0 in | Wt 139.5 lb

## 2016-02-27 DIAGNOSIS — E1142 Type 2 diabetes mellitus with diabetic polyneuropathy: Secondary | ICD-10-CM | POA: Diagnosis not present

## 2016-02-27 DIAGNOSIS — Z87448 Personal history of other diseases of urinary system: Secondary | ICD-10-CM | POA: Diagnosis not present

## 2016-02-27 DIAGNOSIS — Z794 Long term (current) use of insulin: Secondary | ICD-10-CM

## 2016-02-27 DIAGNOSIS — E039 Hypothyroidism, unspecified: Secondary | ICD-10-CM | POA: Diagnosis not present

## 2016-02-27 DIAGNOSIS — E1122 Type 2 diabetes mellitus with diabetic chronic kidney disease: Secondary | ICD-10-CM | POA: Diagnosis not present

## 2016-02-27 DIAGNOSIS — R338 Other retention of urine: Secondary | ICD-10-CM

## 2016-02-27 DIAGNOSIS — N189 Chronic kidney disease, unspecified: Secondary | ICD-10-CM

## 2016-02-27 DIAGNOSIS — N183 Chronic kidney disease, stage 3 unspecified: Secondary | ICD-10-CM

## 2016-02-27 DIAGNOSIS — Z79899 Other long term (current) drug therapy: Secondary | ICD-10-CM

## 2016-02-27 DIAGNOSIS — D649 Anemia, unspecified: Secondary | ICD-10-CM

## 2016-02-27 DIAGNOSIS — Z09 Encounter for follow-up examination after completed treatment for conditions other than malignant neoplasm: Secondary | ICD-10-CM | POA: Diagnosis not present

## 2016-02-27 DIAGNOSIS — E2839 Other primary ovarian failure: Secondary | ICD-10-CM

## 2016-02-27 DIAGNOSIS — I10 Essential (primary) hypertension: Secondary | ICD-10-CM

## 2016-02-27 DIAGNOSIS — I129 Hypertensive chronic kidney disease with stage 1 through stage 4 chronic kidney disease, or unspecified chronic kidney disease: Secondary | ICD-10-CM

## 2016-02-27 LAB — GLUCOSE, CAPILLARY: Glucose-Capillary: 105 mg/dL — ABNORMAL HIGH (ref 65–99)

## 2016-02-27 NOTE — Progress Notes (Signed)
   Subjective:    Patient ID: [name], female    DOB: 10-30-1950, 66 y.o.   MRN: [id number]  HPI  I have seen and examined this patient. She is here for follow up of her DM and hypothroidism.  She was recently hospitalized for syncope and urinary retention and was discharged on 02/21/15. Since discharge patient has been feeling well. No new complaints since then. States she is compliant with her medications and has had no issues urinating.  Review of Systems  Constitutional: Negative.   HENT: Negative.   Respiratory: Negative.   Cardiovascular: Negative.   Gastrointestinal: Negative.   Genitourinary: Negative for difficulty urinating, dysuria, frequency, hematuria and urgency.  Musculoskeletal: Negative.   Neurological: Negative.   Psychiatric/Behavioral: Negative.        Objective:   Physical Exam  Constitutional: She is oriented to person, place, and time. She appears well-developed and well-nourished.  HENT:  Head: Normocephalic and atraumatic.  Neck: Neck supple.  Cardiovascular: Normal rate, regular rhythm and normal heart sounds.   Pulmonary/Chest: Effort normal and breath sounds normal. No respiratory distress. She has no wheezes. She has no rales.  Abdominal: Soft. Bowel sounds are normal. She exhibits no distension. There is no tenderness.  Musculoskeletal: Normal range of motion. She exhibits no edema.  Lymphadenopathy:    She has no cervical adenopathy.  Neurological: She is alert and oriented to person, place, and time.  Psychiatric: She has a normal mood and affect. Her behavior is normal.          Assessment & Plan:  Please see problem based charting for assessment and plan:

## 2016-02-27 NOTE — Progress Notes (Signed)
Bladder scan 54 cc.  Sander Nephew, RN 02/27/2016 11:28 AM

## 2016-02-27 NOTE — Assessment & Plan Note (Signed)
-   Will check bone density scan

## 2016-02-27 NOTE — Assessment & Plan Note (Signed)
-   c/w levothyroxine at current dose for now - Will check TSH today - No active signs or symptoms of hypothyroidism

## 2016-02-27 NOTE — Assessment & Plan Note (Signed)
-   Patient was admitted with acute urinary retention requiring catheterization - Patient was noted to have mild R hydroureteronephrosis on CT - Will refer to urology for follow up today - Patient with no new symptoms of urinary retention and had a post void residual of 54 cc in the clinic today

## 2016-02-27 NOTE — Assessment & Plan Note (Signed)
-   Patient follows with Dr. Renne Crigler and has noted an improvement in her A1c from 11.7 in November to 8.7 this month - She is on levemir 15 units, humalog 3-4 units before meals, victoza 1.8 mg - She is to f/u with endocrine on 03/07/16 - Will not change medications for now - Her blood sugars had an average of 155 but she was noted to have highs in the 300s on review of her glucometer

## 2016-02-27 NOTE — Assessment & Plan Note (Signed)
BP Readings from Last 3 Encounters:  02/27/16 140/60  02/21/16 (!) 115/52  01/25/16 122/70    Lab Results  Component Value Date   NA 137 02/21/2016   K 3.8 02/21/2016   CREATININE 0.86 02/21/2016    Assessment: Blood pressure control:  fair Progress toward BP goal:   at goal Comments: c/w amlodipine and HCTZ  Plan: Medications:  continue current medications Educational resources provided: brochure (denies need ) Self management tools provided:   Other plans: Will check BMP. Can consider increasing HCTZ if BP elevated on f/u

## 2016-02-27 NOTE — Assessment & Plan Note (Signed)
-   creatinine has been normal over the last year - Will check BMP today but will resolve this problem if it remains normal

## 2016-02-27 NOTE — Assessment & Plan Note (Signed)
-   Noted to have mild anemia on recent admission - Will need to repeat CBC on follow up appointment - Denies fatigue, no SOB, no CP, no palpitations

## 2016-02-27 NOTE — Patient Instructions (Addendum)
-   It was a pleasure seeing you today - Your blood sugars are much improved. Keep up the great work - We will set up an appointment for your bone scan today - We will refer you to urology for your urinary retention - We will check some blood work today

## 2016-02-27 NOTE — Assessment & Plan Note (Signed)
-   Bone density scan ordered today - Will follow up results

## 2016-02-28 ENCOUNTER — Telehealth: Payer: Self-pay | Admitting: Internal Medicine

## 2016-02-28 ENCOUNTER — Telehealth: Payer: Self-pay

## 2016-02-28 LAB — BMP8+ANION GAP
Anion Gap: 21 mmol/L — ABNORMAL HIGH (ref 10.0–18.0)
BUN/Creatinine Ratio: 16 (ref 12–28)
BUN: 14 mg/dL (ref 8–27)
CO2: 21 mmol/L (ref 18–29)
Calcium: 9.8 mg/dL (ref 8.7–10.3)
Chloride: 99 mmol/L (ref 96–106)
Creatinine, Ser: 0.9 mg/dL (ref 0.57–1.00)
GFR calc Af Amer: 78 mL/min/{1.73_m2} (ref >59)
GFR calc non Af Amer: 67 mL/min/{1.73_m2} (ref >59)
Glucose: 101 mg/dL — ABNORMAL HIGH (ref 65–99)
Potassium: 3.7 mmol/L (ref 3.5–5.2)
Sodium: 141 mmol/L (ref 134–144)

## 2016-02-28 LAB — MAGNESIUM: Magnesium: 1.4 mg/dL — ABNORMAL LOW (ref 1.6–2.3)

## 2016-02-28 LAB — TSH: TSH: 10.97 u[IU]/mL — ABNORMAL HIGH (ref 0.450–4.500)

## 2016-02-28 MED ORDER — MAGNESIUM OXIDE -MG SUPPLEMENT 400 (240 MG) MG PO TABS: ORAL_TABLET | ORAL | 0 refills | Status: DC

## 2016-02-28 MED ORDER — LEVOTHYROXINE SODIUM 88 MCG PO TABS: ORAL_TABLET | ORAL | 2 refills | Status: DC

## 2016-02-28 NOTE — Telephone Encounter (Signed)
Spoke w/ pt, informed her yes both would help her BP, she was happy, call ended

## 2016-02-28 NOTE — Telephone Encounter (Signed)
I called patient to discuss the results of her blood work. Her TSH is now elevated. We will add on a free T4 level to her blood work from yesterday. I think we will need to increase her synthroid to 88 micrograms given her elevation in TSH. Patient states she is in compliance with her medications. Patient was also noted to have a borderline low magnesium level. Will order magnesium supplementation for her. Patient expresses understanding and is in agreement with the plan

## 2016-02-28 NOTE — Addendum Note (Signed)
Addended by: Aldine Contes on: 02/28/2016 09:23 AM   Modules accepted: Orders

## 2016-02-28 NOTE — Telephone Encounter (Signed)
Needs to speak with a nurse regarding hydrochlorothiazide (MICROZIDE) 12.5 MG capsule, and amLODipine (NORVASC) 10 MG tablet. Please call back.

## 2016-02-29 ENCOUNTER — Encounter: Payer: Self-pay | Admitting: *Deleted

## 2016-02-29 ENCOUNTER — Encounter (INDEPENDENT_AMBULATORY_CARE_PROVIDER_SITE_OTHER): Payer: Medicare Other | Admitting: Ophthalmology

## 2016-02-29 ENCOUNTER — Other Ambulatory Visit: Payer: Self-pay | Admitting: Internal Medicine

## 2016-02-29 ENCOUNTER — Ambulatory Visit (INDEPENDENT_AMBULATORY_CARE_PROVIDER_SITE_OTHER): Payer: Medicare Other | Admitting: Dietician

## 2016-02-29 DIAGNOSIS — E11311 Type 2 diabetes mellitus with unspecified diabetic retinopathy with macular edema: Secondary | ICD-10-CM | POA: Diagnosis not present

## 2016-02-29 DIAGNOSIS — Z794 Long term (current) use of insulin: Principal | ICD-10-CM

## 2016-02-29 DIAGNOSIS — E113312 Type 2 diabetes mellitus with moderate nonproliferative diabetic retinopathy with macular edema, left eye: Secondary | ICD-10-CM | POA: Diagnosis not present

## 2016-02-29 DIAGNOSIS — Z6824 Body mass index (BMI) 24.0-24.9, adult: Secondary | ICD-10-CM

## 2016-02-29 DIAGNOSIS — E113391 Type 2 diabetes mellitus with moderate nonproliferative diabetic retinopathy without macular edema, right eye: Secondary | ICD-10-CM | POA: Diagnosis not present

## 2016-02-29 DIAGNOSIS — E1165 Type 2 diabetes mellitus with hyperglycemia: Secondary | ICD-10-CM

## 2016-02-29 DIAGNOSIS — I10 Essential (primary) hypertension: Secondary | ICD-10-CM | POA: Diagnosis not present

## 2016-02-29 DIAGNOSIS — H35033 Hypertensive retinopathy, bilateral: Secondary | ICD-10-CM | POA: Diagnosis not present

## 2016-02-29 DIAGNOSIS — H43813 Vitreous degeneration, bilateral: Secondary | ICD-10-CM

## 2016-02-29 DIAGNOSIS — E1142 Type 2 diabetes mellitus with diabetic polyneuropathy: Secondary | ICD-10-CM

## 2016-02-29 DIAGNOSIS — Z713 Dietary counseling and surveillance: Secondary | ICD-10-CM | POA: Diagnosis not present

## 2016-02-29 LAB — HM DIABETES EYE EXAM

## 2016-02-29 LAB — SPECIMEN STATUS REPORT

## 2016-02-29 LAB — T4, FREE: Free T4: 1.01 ng/dL (ref 0.82–1.77)

## 2016-02-29 LAB — GLUCOSE, CAPILLARY: Glucose-Capillary: 238 mg/dL — ABNORMAL HIGH (ref 65–99)

## 2016-02-29 NOTE — Progress Notes (Signed)
  Medical Nutrition Therapy:  Appt start time: 1325 end time: 1440   Assessment:  Primary concerns today: blood sugar control. [name] is here today with her grand daughter. She does not have her meter and just ate,and did not take any Humalog because she didn't have it with her. She wants to know about insulin pumps because she was told one was recommended for her.  Her grand daughter was asking many questions about diabetes and how it relates to her grandmother.  WEIGHT: acceptable weight for health, she did not want to weigh today, most recent weight 139.5#, BMI 24.71   desired weight is 140-150# BLOOD SUGARS: A1C 8.7%.Goal per Epic is <7%,  CBG after eating and no humalog today in office 238, she reports fasting today was 107.  MEDICATIONS: levemir to 15 units at night, Victoza 1.8 mg qam,  metformin twice daily, does not take Humalog for small snacks like a cutie, may take 2 units for a snack of cereal, milk & banana. Humalog 2 units ~ 1-2x/day, 3 units ~ 1x/day SLEEP:no problems reported DIABETES DISTRESS: not measured today DIETARY INTAKE: Her meal pattern is variable. Sundays- banana before church with no Humalog ; 1 PM-  2 units for  K & W meal; 2 units for dinner about 6 PM.  Some days she skips lunch if she eats breakfast and other days if she skips breakfast she eats lunch. Snacks midday whwn she skips lunch. Sometimes sleeps through dinner at 6-7 and eats later when she awakens.   Breakfast 10-11, takes ~ 2 units when she eatssome days skips or  1 egg, 1./2 cup grits, 1/2 sausage, water and coffee with liquid creamer x 4 TBSP (25-35g carb)  or cereal, milk & 1/2 banana & coffee (~50-60 g carb)  Lunch sandwich water & grapes she would take 2 units, today ate some of all of the following: baked chicken, Brussels sprouts, cornbread, carrots and coffee with sweet creamer and said if she had her Humalog she would have tkane 3 units.  Dinner is 6-7- when she eats with family she takes 2 units  Humalog, it was unclear if she takes Humalog when she sleeps through dinner.   Estimated daily energy needs: ~ 1400-1600 calories ~ 150+/-10 g carbohydrates  Progress Towards Goal(s):  In progress.   Nutritional Diagnosis:  [place]-2.2 Altered nutrition-related laboratory and [place]-2.4 Predicted food-medication interaction As related to lack of adequate blood sugar control is improving, still not at goal 7%  As evidenced by A1C of 8.6%l .    Intervention:  Nutrition education to both patient and family member on diabetes, carb foods, portions, insulin and food relationship (need to take Humalog with all meals), insulin pumps. Assisted patient with problem solving to improve her Humalog and meter use when eating away from home. Consider providing her with additional meter to carry in her purse.  Coordination of care- suggested Type 1/pump support group or an appointment with Bernie Covey if she thinks she may be interested in an insulin pump/personal CGMS. Consider CGMS using freestyle Libre Pro (in our office).   Handouts given during visit include: picture poster of foods and portion sizes of foods that increase blood sugar. AVS Monitoring/Evaluation:  Dietary intake, exercise, meter, and body weight in 4 week(s)

## 2016-02-29 NOTE — Patient Instructions (Addendum)
   Try to carry a Humalog pen with you with pen needles- they fit into a sandwich bag nicely.  Try to check your blood sugar around your snacks.   You did a great job lowering your blood sugars!  Follow up in 4-6 weeks as needed

## 2016-03-07 ENCOUNTER — Ambulatory Visit (INDEPENDENT_AMBULATORY_CARE_PROVIDER_SITE_OTHER): Payer: Medicare Other | Admitting: Internal Medicine

## 2016-03-07 ENCOUNTER — Encounter: Payer: Self-pay | Admitting: Internal Medicine

## 2016-03-07 VITALS — BP 122/80 | HR 101 | Wt 135.0 lb

## 2016-03-07 DIAGNOSIS — Z794 Long term (current) use of insulin: Secondary | ICD-10-CM

## 2016-03-07 DIAGNOSIS — E1142 Type 2 diabetes mellitus with diabetic polyneuropathy: Secondary | ICD-10-CM

## 2016-03-07 NOTE — Progress Notes (Signed)
Patient ID: [name], female   DOB: [birth year], 66 y.o.   MRN: [id number]   HPI: [name] is a 66 y.o.-year-old female, initially referred by Dr. Arnell Asal for f/u for DM2, dx in 1991-1992, insulin-dependent since 2000s, uncontrolled, with complications (DR, PN). Last visit 1.5 mo ago.  She was admitted 02/19/2016 for AMS and AP.   She is seeing nutrition and is attending the diabetic classes.  Last hemoglobin A1c was: Lab Results  Component Value Date   HGBA1C 8.7 (H) 02/20/2016   HGBA1C 11.7 12/01/2015   HGBA1C 10.7 08/29/2015   Pt is on a regimen of: - Levemir 15 units at bedtime. - Humalog - may forget if eating out >> sugars 200s: - 2 units with a regular meal - 4 units with a larger meal - Metformin 1000 mg 2x a day, with meals - Victoza 1.8 mg in am before b'fast  Pt checks her sugars 1-3x a day and they are better: - am: 56-99, 186 >> 90, 116-146, 164 >> 75, 88, 282 - 2h after b'fast: n/c - before lunch: 53-138 (low when skips b'fast) >> 80, 87-135, but if forgets Humalog in am: 180-239 >> 94-156, 171, 308 - 2h after lunch: 126, 214 >> 143, 315 >> 95, 189 - before dinner: 56-118 >> 114-149, but if forgets Humalog with lunch: 238, 250  >>93, 186 - 2h after dinner: 413 >> 107, 201 - bedtime: 164 >> 108, 256 - nighttime: n/c >> 67 x1 - Christmas Eve >> 61 x1 Lowest sugar was 53 >> 67 >> 61; she has hypoglycemia awareness at 80.  Highest sugar was HI >> 200s (Thanksgiving) >> 301  Glucometer: One Touch Verio  Pt's meals are: - Breakfast: cereals + milk + banana; scrambled egg + toast; or skips - Lunch: may skip; fast food - Dinner: baked chicken + veggies + starch Stopped icecream Fall 2017  - no CKD, last BUN/creatinine:  Lab Results  Component Value Date   BUN 14 02/27/2016   BUN 10 02/21/2016   CREATININE 0.90 02/27/2016   CREATININE 0.86 02/21/2016   - last set of lipids: Lab Results  Component Value Date   CHOL 190 10/18/2015   HDL  53 10/18/2015   LDLCALC 118 (H) 10/18/2015   TRIG 93 10/18/2015   CHOLHDL 3.6 10/18/2015  On Pravastatin. - last eye exam was in 02/29/2016. + DR.  - + numbness and tingling in her feet.  She had an increased TSH in the hospital: Lab Results  Component Value Date   TSH 10.970 (H) 02/27/2016   She is on 88 mcg LT4 now, increased from 75 mcg last week. This is managed by PCP.  ROS: Constitutional: + Fatigue, no weight loss/gain, + hot flashes, + nocturia, + poor sleep Eyes: no blurry vision, no xerophthalmia ENT: no sore throat, no nodules palpated in throat, no dysphagia/odynophagia, no hoarseness Cardiovascular: no CP/SOB/palpitations/leg swelling Respiratory: no cough/SOB Gastrointestinal: no N/V/D/+ C Musculoskeletal: no muscle aches/no joint aches Skin: no rash Neurological: no tremors/numbness/tingling/dizziness  I reviewed pt's medications, allergies, PMH, social hx, family hx, and changes were documented in the history of present illness. Otherwise, unchanged from my initial visit note.  Past Medical History:  Diagnosis Date  . Acute urinary retention 01/2016  . Allergy   . Anemia   . Arthritis   . BREAST CANCER, HX OF 04/16/2006   Qualifier: Diagnosis of  By: Leward Quan MD, Pamala Hurry    . Cancer ([place])   . Cataract   .  Chronic kidney disease   . Depression   . Diabetes mellitus without complication ([place])   . Diverticulosis   . Glaucoma   . Hyperlipidemia   . Hypertension   . Hypothyroidism   . Left arm pain 07/28/2012  . Lymphedema of upper extremity    right  . Neuromuscular disorder ([place])   . Thyroid disease    Past Surgical History:  Procedure Laterality Date  . ABDOMINAL HYSTERECTOMY    . BREAST SURGERY    . EYE SURGERY    . FINGER SURGERY Left 2016   thumb infection (3 times so far)  . GANGLION CYST EXCISION Right 2016   right wrist  . I&D EXTREMITY Left 09/09/2014   Procedure: IRRIGATION AND DEBRIDEMENT LEFT THUMB DISTAL PHALANX;  Surgeon: Daryll Brod, MD;  Location: [facility];  Service: Orthopedics;  Laterality: Left;  [name] MASTECTOMY Right 1999  . TUBAL LIGATION     Social History   Social History  . Marital status: Widow     Spouse name: N/A  . Number of children: 4   Occupational History  . Retired   Social History Main Topics  . Smoking status: Smoker  . Smokeless tobacco: Never Used  . Alcohol use yes  . Drug use: yes   Current Outpatient Prescriptions on File Prior to Visit  Medication Sig Dispense Refill  . acetaminophen (TYLENOL) 325 MG tablet Take 325 mg by mouth every 6 (six) hours as needed.    [name] amLODipine (NORVASC) 10 MG tablet Take 1 tablet (10 mg total) by mouth daily. 30 tablet 11  . aspirin EC 81 MG tablet Take 81 mg by mouth at bedtime.    . Cholecalciferol (VITAMIN D-3) 1000 UNITS CAPS Take 2,000 Units by mouth daily.     . cycloSPORINE (RESTASIS) 0.05 % ophthalmic emulsion Place 1 drop into both eyes 2 (two) times daily.    [name] glucose 4 GM chewable tablet Chew 4 tablets (16 g total) by mouth as needed for low blood sugar. 50 tablet 12  . hydrochlorothiazide (MICROZIDE) 12.5 MG capsule TAKE 1 CAPSULE BY MOUTH EVERY DAY 90 capsule 0  . Insulin Detemir (LEVEMIR FLEXTOUCH) 100 UNIT/ML Pen Inject 15 Units into the skin daily at 10 pm. 15 mL 6  . insulin lispro (HUMALOG KWIKPEN) 100 UNIT/ML KiwkPen Take 3-4U three times a day before meals. 15 mL 11  . levothyroxine (SYNTHROID, LEVOTHROID) 88 MCG tablet TAKE 1 TABLET BY MOUTH DAILY BEFORE BREAKFAST 30 tablet 2  . Liraglutide 18 MG/3ML SOPN Inject 0.3 mLs (1.8 mg total) into the skin daily. 6 mL 3  . Magnesium Oxide 400 (240 Mg) MG TABS Take 1 tablet orally twice a day for 3 days 6 tablet 0  . meloxicam (MOBIC) 15 MG tablet Take 15 mg by mouth daily.    . metFORMIN (GLUCOPHAGE) 1000 MG tablet Take 1 tablet (1,000 mg total) by mouth 2 (two) times daily with a meal. 60 tablet 6  . potassium chloride 20 MEQ/15ML (10%) SOLN Take 15 mLs (20 mEq total)  by mouth daily as needed (for muscle cramps). 240 mL 0  . pravastatin (PRAVACHOL) 40 MG tablet TAKE 1 TABLET BY MOUTH DAILY 90 tablet 3  . pregabalin (LYRICA) 50 MG capsule Take 1 capsule (50 mg total) by mouth 3 (three) times daily. 90 capsule 2  . venlafaxine XR (EFFEXOR-XR) 150 MG 24 hr capsule TAKE 1 CAPSULE(150 MG) BY MOUTH DAILY WITH BREAKFAST 90 capsule 0   No current  facility-administered medications on file prior to visit.    Allergies  Allergen Reactions  . Gabapentin Other (See Comments)    Dizziness from 300 mg, tolerates 100 mg  . Penicillins Rash  . Sulfonamide Derivatives Rash   Family History  Problem Relation Age of Onset  . Heart disease Father   . Diabetes Father   . Stroke Sister   . Anuerysm Sister 87    brain; maternal half-sister  . Heart disease Brother   . Anuerysm Brother 56    aortic; maternal half-brother  . Breast cancer Daughter 52    negative genetic testing in 2015  . Heart attack Daughter     73-47  . Diabetes Paternal Grandmother   . Stroke Paternal Grandfather   . Anuerysm Brother     NOS type; full brother  . Fibroids Daughter     s/p hysterectomy at 58y  . Brain cancer Maternal Uncle     dx. older than 32; NOS type  . Brain cancer Cousin     maternal 1st cousin; d. early 25s; NOS type  . Deafness Paternal Uncle     prelingual   PE: BP 122/80 (BP Location: Left Arm, Patient Position: Sitting)   Pulse (!) 101   Wt 135 lb (61.2 kg)   SpO2 96%   BMI 23.91 kg/m  Wt Readings from Last 3 Encounters:  03/07/16 135 lb (61.2 kg)  02/27/16 139 lb 8 oz (63.3 kg)  02/08/16 140 lb (63.5 kg)   Constitutional: Normal weight, in NAD, walks with cane Eyes: PERRLA, EOMI, no exophthalmos ENT: moist mucous membranes, no thyromegaly, no cervical lymphadenopathy Cardiovascular: RRR, No MRG Respiratory: CTA B Gastrointestinal: abdomen soft, NT, ND, BS+ Musculoskeletal: no deformities, strength intact in all 4 Skin: moist, warm, no  rashes Neurological: no tremor with outstretched hands, DTR normal in all 4  ASSESSMENT: 1. DM2, insulin-dependent, uncontrolled, with complications - DR - PN  PLAN:  1. Patient with long-standing, uncontrolled diabetes, on injectable and oral medication regimen. Sugars have improved in last 3 mo, but she still has variability b/c of forgetting insulin doses. Sugars are at goal if she takes Humalog, but they increase to 200s if she forgets. We again discussed about the importance of taking Humalog before each meal and to take the pen with her if going out or to her daughter's house. - her HbA1c has greatly improved from 11.7% >> 8.7% at last check less than 2 weeks ago! - I suggested to:  Patient Instructions  Please continue: - Levemir 15 units at bedtime. - Humalog: - 2 units with a regular meal - 4 units with a larger meal - Metformin 1000 mg 2x a day, with meals - Victoza 1.8 mg in am before b'fast  Please return in 2 months with your sugar log.   - check sugars at different times of the day - check 3 times a day, rotating checks - advised for yearly eye exams >> she is up-to-date - UTD with PNA and flu shot - Return to clinic in 3 mo with sugar log   Philemon Kingdom, MD PhD [facility] Endocrinology

## 2016-03-07 NOTE — Patient Instructions (Addendum)
Please continue: - Levemir 15 units at bedtime. - Humalog: - 2 units with a regular meal - 4 units with a larger meal - Metformin 1000 mg 2x a day, with meals - Victoza 1.8 mg in am before b'fast  Please return in 3 months with your sugar log.

## 2016-03-08 LAB — SPECIMEN STATUS REPORT

## 2016-03-08 LAB — T4, FREE: Free T4: 1.01 ng/dL (ref 0.82–1.77)

## 2016-03-14 DIAGNOSIS — C50911 Malignant neoplasm of unspecified site of right female breast: Secondary | ICD-10-CM | POA: Diagnosis not present

## 2016-03-19 DIAGNOSIS — Z961 Presence of intraocular lens: Secondary | ICD-10-CM | POA: Diagnosis not present

## 2016-03-19 DIAGNOSIS — H18413 Arcus senilis, bilateral: Secondary | ICD-10-CM | POA: Diagnosis not present

## 2016-03-19 DIAGNOSIS — H40023 Open angle with borderline findings, high risk, bilateral: Secondary | ICD-10-CM | POA: Diagnosis not present

## 2016-03-19 DIAGNOSIS — Z9849 Cataract extraction status, unspecified eye: Secondary | ICD-10-CM | POA: Diagnosis not present

## 2016-03-19 DIAGNOSIS — H40003 Preglaucoma, unspecified, bilateral: Secondary | ICD-10-CM | POA: Diagnosis not present

## 2016-03-19 DIAGNOSIS — H04123 Dry eye syndrome of bilateral lacrimal glands: Secondary | ICD-10-CM | POA: Diagnosis not present

## 2016-03-19 DIAGNOSIS — E113291 Type 2 diabetes mellitus with mild nonproliferative diabetic retinopathy without macular edema, right eye: Secondary | ICD-10-CM | POA: Diagnosis not present

## 2016-03-19 DIAGNOSIS — E113212 Type 2 diabetes mellitus with mild nonproliferative diabetic retinopathy with macular edema, left eye: Secondary | ICD-10-CM | POA: Diagnosis not present

## 2016-03-19 DIAGNOSIS — H40043 Steroid responder, bilateral: Secondary | ICD-10-CM | POA: Diagnosis not present

## 2016-03-19 DIAGNOSIS — H11153 Pinguecula, bilateral: Secondary | ICD-10-CM | POA: Diagnosis not present

## 2016-03-19 DIAGNOSIS — H35033 Hypertensive retinopathy, bilateral: Secondary | ICD-10-CM | POA: Diagnosis not present

## 2016-03-19 DIAGNOSIS — H11423 Conjunctival edema, bilateral: Secondary | ICD-10-CM | POA: Diagnosis not present

## 2016-03-21 ENCOUNTER — Telehealth: Payer: Self-pay | Admitting: *Deleted

## 2016-03-21 DIAGNOSIS — C50911 Malignant neoplasm of unspecified site of right female breast: Secondary | ICD-10-CM | POA: Diagnosis not present

## 2016-03-21 NOTE — Telephone Encounter (Signed)
Dispensing order faxed to [facility] for prosthesis

## 2016-03-22 DIAGNOSIS — C50911 Malignant neoplasm of unspecified site of right female breast: Secondary | ICD-10-CM | POA: Diagnosis not present

## 2016-03-25 ENCOUNTER — Other Ambulatory Visit: Payer: Self-pay | Admitting: Internal Medicine

## 2016-03-29 ENCOUNTER — Other Ambulatory Visit: Payer: Self-pay | Admitting: Internal Medicine

## 2016-04-02 ENCOUNTER — Encounter: Payer: Self-pay | Admitting: Dietician

## 2016-04-02 ENCOUNTER — Encounter: Payer: Medicare Other | Attending: Internal Medicine | Admitting: Dietician

## 2016-04-02 DIAGNOSIS — N189 Chronic kidney disease, unspecified: Secondary | ICD-10-CM | POA: Insufficient documentation

## 2016-04-02 DIAGNOSIS — Z713 Dietary counseling and surveillance: Secondary | ICD-10-CM | POA: Insufficient documentation

## 2016-04-02 DIAGNOSIS — Z853 Personal history of malignant neoplasm of breast: Secondary | ICD-10-CM | POA: Insufficient documentation

## 2016-04-02 DIAGNOSIS — E1122 Type 2 diabetes mellitus with diabetic chronic kidney disease: Secondary | ICD-10-CM | POA: Diagnosis not present

## 2016-04-02 DIAGNOSIS — E785 Hyperlipidemia, unspecified: Secondary | ICD-10-CM | POA: Insufficient documentation

## 2016-04-02 DIAGNOSIS — I129 Hypertensive chronic kidney disease with stage 1 through stage 4 chronic kidney disease, or unspecified chronic kidney disease: Secondary | ICD-10-CM | POA: Insufficient documentation

## 2016-04-02 DIAGNOSIS — Z794 Long term (current) use of insulin: Secondary | ICD-10-CM

## 2016-04-02 DIAGNOSIS — E1142 Type 2 diabetes mellitus with diabetic polyneuropathy: Secondary | ICD-10-CM

## 2016-04-02 NOTE — Patient Instructions (Addendum)
Continue to be active.  Aim for walking most days of the week for around 30 minutes. Be sure to keep your doctor's appointments. Continue to take your medication as prescribed. Continue to check your blood sugar and take your meter and records to your doctor. Try to eat breakfast, lunch, and dinner daily and take your Humalog with each of these meals. Consider drinking a protein shake such as sugar free Carnation Breakfast Essentials in the morning.  Take Humalog with this too.

## 2016-04-02 NOTE — Progress Notes (Signed)
Medical Nutrition Therapy:  Appt start time: [id number] end time:  [id number].   Assessment:  Primary concerns today: Patient is here alone.  It is a referral for type 2 diabetes from Dr. Cruzita Lederer.  She states that she wants to learn what to eat and what not to eat.  Patient complains that she can only urinate 1-2 times per day at times.  She follows up with a Urologist next week.  Other hx includes hyperlipidemia, HTN, CKD, history of breast cancer.  She has had type 2 diabetes since 1992 and reported her weight was about 275 lbs at that time.  Weight today is 140 lbs and states that she has decreased to 135 lbs recently.  Complains that appetite is poor.  Patient is followed by Debera Lat, RD at the [facility]. Her A1C has decreased from 11.7% 12/01/15 to 8.7% 02/20/16. Patient lives with her daughter and son.  Her daughter and patient do the shopping and everyone shares cooking.  But sometimes she does not like what her son cooks.  Patient is retired.  Preferred Learning Style:   No preference indicated   Learning Readiness:   Ready   MEDICATIONS: see list to include Levemir 15 units every HS, Humalog 3-4 units every meal, Victoza, and Metformin.  She states that she is remembering to take her medications now including insulin with meals.   DIETARY INTAKE:  Usual eating pattern includes 2 meals and 0 snacks per day. Patient states that she gets up and takes her daughter at 8 am.  She then may return home and fall asleep again.  She may skip breakfast completely or have something later morning when she gets hungry then overeats.  Complains of poor appetite at times.  24-hr recall:  B (11 AM): cereal and banana OR grits and 2 eggs OJ or other juice, coffee with splenda and creamer Snk ( AM): none  L ( PM): none Snk ( PM): none D (6-7 PM): cabbage, pork chop, other vegetable or sometimes skips the meat and just eat vegetables OR salmon cake and vegetables Snk ( PM): fresh fruit (orange,  raisins, prunes, or banana) Beverages: water, occasional sweet tea rare regular ginger ale  Usual physical activity: walking 30-45 minutes 3 days per week  Estimated energy needs: 1400 calories 158 g carbohydrates 105 g protein 39 g fat  Progress Towards Goal(s):  In progress.   Nutritional Diagnosis:  NB-1.1 Food and nutrition-related knowledge deficit As related to balance of carbohydrate, protein, and fat.  As evidenced by patient report and diet hx.    Intervention:  Nutrition education review on type 2 diabetes, importance of not skipping meals to provide adequate nutrition and not overeating at other meals. Continued to reaffirm the continued use of humalog with all meals.  Gave options of things she could eat when she was not hungry in the morning and alternative proteins when she is not eating meat.  Continue to be active.  Aim for walking most days of the week for around 30 minutes. Be sure to keep your doctor's appointments. Continue to take your medication as prescribed. Continue to check your blood sugar and take your meter and records to your doctor. Try to eat breakfast, lunch, and dinner daily and take your Humalog with each of these meals. Consider drinking a protein shake such as sugar free Carnation Breakfast Essentials in the morning.  Take Humalog with this too.  Teaching Method Utilized:  Visual Auditory Hands on  Handouts given during  visit include:  Meal plan card  My plate  Barriers to learning/adherence to lifestyle change: poor appetite  Demonstrated degree of understanding via:  Teach Back   Monitoring/Evaluation:  Dietary intake, exercise, and body weight prn.  Patient has an appointment to follow up with Debera Lat, RD at the [facility] May 28, 2016.

## 2016-04-04 ENCOUNTER — Encounter (INDEPENDENT_AMBULATORY_CARE_PROVIDER_SITE_OTHER): Payer: Medicare Other | Admitting: Ophthalmology

## 2016-04-10 DIAGNOSIS — N1330 Unspecified hydronephrosis: Secondary | ICD-10-CM | POA: Diagnosis not present

## 2016-04-10 DIAGNOSIS — R3914 Feeling of incomplete bladder emptying: Secondary | ICD-10-CM | POA: Diagnosis not present

## 2016-04-12 ENCOUNTER — Encounter (INDEPENDENT_AMBULATORY_CARE_PROVIDER_SITE_OTHER): Payer: Medicare Other | Admitting: Ophthalmology

## 2016-04-12 DIAGNOSIS — H35033 Hypertensive retinopathy, bilateral: Secondary | ICD-10-CM | POA: Diagnosis not present

## 2016-04-12 DIAGNOSIS — E11311 Type 2 diabetes mellitus with unspecified diabetic retinopathy with macular edema: Secondary | ICD-10-CM

## 2016-04-12 DIAGNOSIS — E113312 Type 2 diabetes mellitus with moderate nonproliferative diabetic retinopathy with macular edema, left eye: Secondary | ICD-10-CM

## 2016-04-12 DIAGNOSIS — H43813 Vitreous degeneration, bilateral: Secondary | ICD-10-CM | POA: Diagnosis not present

## 2016-04-12 DIAGNOSIS — E113391 Type 2 diabetes mellitus with moderate nonproliferative diabetic retinopathy without macular edema, right eye: Secondary | ICD-10-CM

## 2016-04-12 DIAGNOSIS — I10 Essential (primary) hypertension: Secondary | ICD-10-CM | POA: Diagnosis not present

## 2016-04-14 ENCOUNTER — Other Ambulatory Visit: Payer: Self-pay | Admitting: Internal Medicine

## 2016-04-15 ENCOUNTER — Other Ambulatory Visit: Payer: Self-pay | Admitting: Internal Medicine

## 2016-04-23 DIAGNOSIS — R3914 Feeling of incomplete bladder emptying: Secondary | ICD-10-CM | POA: Diagnosis not present

## 2016-04-23 DIAGNOSIS — N1330 Unspecified hydronephrosis: Secondary | ICD-10-CM | POA: Diagnosis not present

## 2016-04-25 ENCOUNTER — Other Ambulatory Visit: Payer: Self-pay | Admitting: Urology

## 2016-05-01 ENCOUNTER — Encounter (HOSPITAL_BASED_OUTPATIENT_CLINIC_OR_DEPARTMENT_OTHER): Payer: Self-pay | Admitting: *Deleted

## 2016-05-01 NOTE — Progress Notes (Signed)
NPO AFTER MN.  ARRIVE AT 0945.  NEEDS ISTAT 8 AND EKG.  WILL TAKE LYRICA, SYNTHROID, AND NORVASC AM DOS W/ SIPS OF WATER.

## 2016-05-02 ENCOUNTER — Ambulatory Visit (INDEPENDENT_AMBULATORY_CARE_PROVIDER_SITE_OTHER): Payer: Medicare Other | Admitting: Podiatry

## 2016-05-02 ENCOUNTER — Ambulatory Visit: Payer: Medicare Other | Admitting: Podiatry

## 2016-05-02 DIAGNOSIS — E1149 Type 2 diabetes mellitus with other diabetic neurological complication: Secondary | ICD-10-CM

## 2016-05-02 DIAGNOSIS — B351 Tinea unguium: Secondary | ICD-10-CM | POA: Diagnosis not present

## 2016-05-02 DIAGNOSIS — L84 Corns and callosities: Secondary | ICD-10-CM

## 2016-05-02 DIAGNOSIS — M79676 Pain in unspecified toe(s): Secondary | ICD-10-CM

## 2016-05-02 NOTE — Progress Notes (Signed)
Patient ID: [name], female   DOB: May 08, 1950, 66 y.o.   MRN: [id number] Complaint:  Visit Type: Patient returns to my office for continued preventative foot care services. Complaint: Patient states" my nails have grown long and thick and become painful to walk and wear shoes" Patient has been diagnosed with DM with no foot complications. The patient presents for preventative foot care services. No changes to ROS.  Painful callus under the ball of left foot.  Podiatric Exam: Vascular: dorsalis pedis and posterior tibial pulses are palpable bilateral. Capillary return is immediate. Temperature gradient is WNL. Skin turgor WNL  Sensorium: Normal Semmes Weinstein monofilament test. Normal tactile sensation bilaterally. Nail Exam: Pt has thick disfigured discolored nails with subungual debris noted bilateral entire nail hallux through fifth toenails Ulcer Exam: There is no evidence of ulcer or pre-ulcerative changes or infection. Orthopedic Exam: Muscle tone and strength are WNL. No limitations in general ROM. No crepitus or effusions noted. Foot type and digits show no abnormalities. Bony prominences are unremarkable. Skin: No Porokeratosis. No infection or ulcers.  Heloma durum fifth toes B/L s/p surgery by Little Ishikawa.  symptomatic porokeratosis sub 1 left foot.  Diagnosis:  Onychomycosis, , Pain in right toe, pain in left toes,  Heloma durum  B/L  Porokeratosis sub 1 left foot.  Treatment & Plan Procedures and Treatment: Consent by patient was obtained for treatment procedures. The patient understood the discussion of treatment and procedures well. All questions were answered thoroughly reviewed. Debridement of mycotic and hypertrophic toenails, 1 through 5 bilateral and clearing of subungual debris. No ulceration, no infection noted.  Return Visit-Office Procedure: Patient instructed to return to the office for a follow up visit 3 months for continued evaluation and treatment.  [name]  DPM

## 2016-05-09 ENCOUNTER — Encounter (HOSPITAL_BASED_OUTPATIENT_CLINIC_OR_DEPARTMENT_OTHER)
Admission: RE | Disposition: A | Discharge status: Home / Self Care | Payer: Self-pay | Source: Ambulatory Visit | Attending: Urology

## 2016-05-09 ENCOUNTER — Ambulatory Visit (HOSPITAL_BASED_OUTPATIENT_CLINIC_OR_DEPARTMENT_OTHER): Payer: Medicare Other | Admitting: Anesthesiology

## 2016-05-09 ENCOUNTER — Ambulatory Visit (HOSPITAL_BASED_OUTPATIENT_CLINIC_OR_DEPARTMENT_OTHER)
Admission: RE | Admit: 2016-05-09 | Discharge: 2016-05-09 | Disposition: A | Discharge status: Home / Self Care | Payer: Medicare Other | Source: Ambulatory Visit | Attending: Urology | Admitting: Urology

## 2016-05-09 ENCOUNTER — Encounter (HOSPITAL_BASED_OUTPATIENT_CLINIC_OR_DEPARTMENT_OTHER): Payer: Self-pay | Admitting: *Deleted

## 2016-05-09 ENCOUNTER — Other Ambulatory Visit: Payer: Self-pay

## 2016-05-09 DIAGNOSIS — E119 Type 2 diabetes mellitus without complications: Secondary | ICD-10-CM | POA: Diagnosis not present

## 2016-05-09 DIAGNOSIS — Z882 Allergy status to sulfonamides status: Secondary | ICD-10-CM | POA: Diagnosis not present

## 2016-05-09 DIAGNOSIS — E7800 Pure hypercholesterolemia, unspecified: Secondary | ICD-10-CM | POA: Insufficient documentation

## 2016-05-09 DIAGNOSIS — Z888 Allergy status to other drugs, medicaments and biological substances status: Secondary | ICD-10-CM | POA: Diagnosis not present

## 2016-05-09 DIAGNOSIS — Z794 Long term (current) use of insulin: Secondary | ICD-10-CM | POA: Diagnosis not present

## 2016-05-09 DIAGNOSIS — I10 Essential (primary) hypertension: Secondary | ICD-10-CM | POA: Diagnosis not present

## 2016-05-09 DIAGNOSIS — Z7982 Long term (current) use of aspirin: Secondary | ICD-10-CM | POA: Diagnosis not present

## 2016-05-09 DIAGNOSIS — E039 Hypothyroidism, unspecified: Secondary | ICD-10-CM | POA: Insufficient documentation

## 2016-05-09 DIAGNOSIS — N1330 Unspecified hydronephrosis: Secondary | ICD-10-CM | POA: Diagnosis not present

## 2016-05-09 DIAGNOSIS — Z853 Personal history of malignant neoplasm of breast: Secondary | ICD-10-CM | POA: Diagnosis not present

## 2016-05-09 DIAGNOSIS — N2889 Other specified disorders of kidney and ureter: Secondary | ICD-10-CM | POA: Insufficient documentation

## 2016-05-09 DIAGNOSIS — I129 Hypertensive chronic kidney disease with stage 1 through stage 4 chronic kidney disease, or unspecified chronic kidney disease: Secondary | ICD-10-CM | POA: Diagnosis not present

## 2016-05-09 DIAGNOSIS — N189 Chronic kidney disease, unspecified: Secondary | ICD-10-CM | POA: Diagnosis not present

## 2016-05-09 DIAGNOSIS — Z880 Allergy status to penicillin: Secondary | ICD-10-CM | POA: Insufficient documentation

## 2016-05-09 DIAGNOSIS — E1142 Type 2 diabetes mellitus with diabetic polyneuropathy: Secondary | ICD-10-CM | POA: Diagnosis not present

## 2016-05-09 DIAGNOSIS — Z79899 Other long term (current) drug therapy: Secondary | ICD-10-CM | POA: Insufficient documentation

## 2016-05-09 HISTORY — DX: Unspecified macular degeneration: H35.30

## 2016-05-09 HISTORY — DX: Type 2 diabetes mellitus without complications: E11.9

## 2016-05-09 HISTORY — DX: Feeling of incomplete bladder emptying: R39.14

## 2016-05-09 HISTORY — DX: Personal history of malignant neoplasm of breast: Z85.3

## 2016-05-09 HISTORY — PX: CYSTOSCOPY/RETROGRADE/URETEROSCOPY: SHX5316

## 2016-05-09 HISTORY — DX: Diverticulosis of large intestine without perforation or abscess without bleeding: K57.30

## 2016-05-09 HISTORY — DX: Dry eye syndrome of bilateral lacrimal glands: H04.123

## 2016-05-09 HISTORY — DX: Urgency of urination: R39.15

## 2016-05-09 HISTORY — DX: Type 2 diabetes mellitus with diabetic polyneuropathy: E11.42

## 2016-05-09 HISTORY — DX: Disorder of kidney and ureter, unspecified: N28.9

## 2016-05-09 HISTORY — DX: Personal history of other specified conditions: Z87.898

## 2016-05-09 HISTORY — DX: Type 2 diabetes mellitus without complications: Z79.4

## 2016-05-09 HISTORY — DX: Preglaucoma, unspecified, bilateral: H40.003

## 2016-05-09 HISTORY — DX: Unspecified hydronephrosis: N13.30

## 2016-05-09 LAB — POCT I-STAT, CHEM 8
BUN: 19 mg/dL (ref 6–20)
Calcium, Ion: 1.28 mmol/L (ref 1.15–1.40)
Chloride: 102 mmol/L (ref 101–111)
Creatinine, Ser: 1 mg/dL (ref 0.44–1.00)
Glucose, Bld: 72 mg/dL (ref 65–99)
HCT: 34 % — ABNORMAL LOW (ref 36.0–46.0)
Hemoglobin: 11.6 g/dL — ABNORMAL LOW (ref 12.0–15.0)
Potassium: 3.6 mmol/L (ref 3.5–5.1)
Sodium: 143 mmol/L (ref 135–145)
TCO2: 29 mmol/L (ref 0–100)

## 2016-05-09 SURGERY — CYSTOSCOPY/RETROGRADE/URETEROSCOPY
Anesthesia: General | Laterality: Right

## 2016-05-09 MED ORDER — PHENYLEPHRINE 40 MCG/ML (10ML) SYRINGE FOR IV PUSH (FOR BLOOD PRESSURE SUPPORT)
PREFILLED_SYRINGE | INTRAVENOUS | Status: AC
  Filled 2016-05-09: qty 10

## 2016-05-09 MED ORDER — ACETAMINOPHEN 325 MG PO TABS
650.0000 mg | ORAL_TABLET | ORAL | Status: DC | PRN
  Filled 2016-05-09: qty 2

## 2016-05-09 MED ORDER — SODIUM CHLORIDE 0.9% FLUSH
3.0000 mL | INTRAVENOUS | Status: DC | PRN
  Filled 2016-05-09: qty 3

## 2016-05-09 MED ORDER — MORPHINE SULFATE (PF) 2 MG/ML IV SOLN
1.0000 mg | INTRAVENOUS | Status: DC | PRN
  Filled 2016-05-09: qty 0.5

## 2016-05-09 MED ORDER — MIDAZOLAM HCL 5 MG/5ML IJ SOLN
INTRAMUSCULAR | Status: DC | PRN
  Administered 2016-05-09: 1 mg via INTRAVENOUS

## 2016-05-09 MED ORDER — CIPROFLOXACIN IN D5W 400 MG/200ML IV SOLN
400.0000 mg | Freq: Once | INTRAVENOUS | Status: AC
  Administered 2016-05-09: 400 mg via INTRAVENOUS
  Filled 2016-05-09: qty 200

## 2016-05-09 MED ORDER — CIPROFLOXACIN IN D5W 400 MG/200ML IV SOLN
INTRAVENOUS | Status: AC
  Filled 2016-05-09: qty 200

## 2016-05-09 MED ORDER — LACTATED RINGERS IV SOLN
INTRAVENOUS | Status: DC
  Administered 2016-05-09: 10:00:00 via INTRAVENOUS
  Filled 2016-05-09: qty 1000

## 2016-05-09 MED ORDER — ACETAMINOPHEN 650 MG RE SUPP
650.0000 mg | RECTAL | Status: DC | PRN
  Filled 2016-05-09: qty 1

## 2016-05-09 MED ORDER — MEPERIDINE HCL 25 MG/ML IJ SOLN
6.2500 mg | INTRAMUSCULAR | Status: DC | PRN
  Filled 2016-05-09: qty 1

## 2016-05-09 MED ORDER — SODIUM CHLORIDE 0.9% FLUSH
3.0000 mL | Freq: Two times a day (BID) | INTRAVENOUS | Status: DC
  Filled 2016-05-09: qty 3

## 2016-05-09 MED ORDER — DEXAMETHASONE SODIUM PHOSPHATE 10 MG/ML IJ SOLN
INTRAMUSCULAR | Status: DC | PRN
  Administered 2016-05-09: 10 mg via INTRAVENOUS

## 2016-05-09 MED ORDER — PHENYLEPHRINE HCL 10 MG/ML IJ SOLN
INTRAMUSCULAR | Status: DC | PRN
  Administered 2016-05-09: 120 ug via INTRAVENOUS

## 2016-05-09 MED ORDER — LIDOCAINE 2% (20 MG/ML) 5 ML SYRINGE
INTRAMUSCULAR | Status: DC | PRN
Start: 2016-05-09 — End: 2016-05-09
  Administered 2016-05-09: 50 mg via INTRAVENOUS

## 2016-05-09 MED ORDER — FENTANYL CITRATE (PF) 100 MCG/2ML IJ SOLN
INTRAMUSCULAR | Status: AC
  Filled 2016-05-09: qty 2

## 2016-05-09 MED ORDER — FENTANYL CITRATE (PF) 100 MCG/2ML IJ SOLN
INTRAMUSCULAR | Status: DC | PRN
  Administered 2016-05-09: 50 ug via INTRAVENOUS

## 2016-05-09 MED ORDER — OXYCODONE HCL 5 MG PO TABS
5.0000 mg | ORAL_TABLET | ORAL | Status: DC | PRN
  Filled 2016-05-09: qty 2

## 2016-05-09 MED ORDER — PROPOFOL 10 MG/ML IV BOLUS
INTRAVENOUS | Status: AC
  Filled 2016-05-09: qty 40

## 2016-05-09 MED ORDER — SODIUM CHLORIDE 0.9 % IV SOLN
250.0000 mL | INTRAVENOUS | Status: DC | PRN
  Filled 2016-05-09: qty 250

## 2016-05-09 MED ORDER — IOHEXOL 300 MG/ML  SOLN
INTRAMUSCULAR | Status: DC | PRN
  Administered 2016-05-09: 10 mL via INTRAVENOUS

## 2016-05-09 MED ORDER — ONDANSETRON HCL 4 MG/2ML IJ SOLN
INTRAMUSCULAR | Status: DC | PRN
  Administered 2016-05-09: 4 mg via INTRAVENOUS

## 2016-05-09 MED ORDER — MIDAZOLAM HCL 2 MG/2ML IJ SOLN
INTRAMUSCULAR | Status: AC
  Filled 2016-05-09: qty 2

## 2016-05-09 MED ORDER — FENTANYL CITRATE (PF) 100 MCG/2ML IJ SOLN
25.0000 ug | INTRAMUSCULAR | Status: DC | PRN
  Filled 2016-05-09: qty 1

## 2016-05-09 MED ORDER — METOCLOPRAMIDE HCL 5 MG/ML IJ SOLN
10.0000 mg | Freq: Once | INTRAMUSCULAR | Status: DC | PRN
  Filled 2016-05-09: qty 2

## 2016-05-09 MED ORDER — PROPOFOL 500 MG/50ML IV EMUL
INTRAVENOUS | Status: DC | PRN
  Administered 2016-05-09: 120 mL via INTRAVENOUS

## 2016-05-09 SURGICAL SUPPLY — 18 items
BAG DRAIN URO-CYSTO SKYTR STRL (DRAIN) ×2 IMPLANT
BAG DRN UROCATH (DRAIN) ×1
CATH URET 5FR 28IN CONE TIP (BALLOONS)
CATH URET 5FR 28IN OPEN ENDED (CATHETERS) ×2 IMPLANT
CATH URET 5FR 70CM CONE TIP (BALLOONS) IMPLANT
CLOTH BEACON ORANGE TIMEOUT ST (SAFETY) ×4 IMPLANT
GLOVE SURG SS PI 8.0 STRL IVOR (GLOVE) ×2 IMPLANT
GOWN STRL REUS W/ TWL XL LVL3 (GOWN DISPOSABLE) ×1 IMPLANT
GOWN STRL REUS W/TWL XL LVL3 (GOWN DISPOSABLE) ×2
GUIDEWIRE 0.038 PTFE COATED (WIRE) IMPLANT
GUIDEWIRE ANG ZIPWIRE 038X150 (WIRE) IMPLANT
GUIDEWIRE STR DUAL SENSOR (WIRE) ×2 IMPLANT
IV NS IRRIG 3000ML ARTHROMATIC (IV SOLUTION) ×4 IMPLANT
KIT RM TURNOVER CYSTO AR (KITS) ×2 IMPLANT
MANIFOLD NEPTUNE II (INSTRUMENTS) IMPLANT
NS IRRIG 500ML POUR BTL (IV SOLUTION) IMPLANT
PACK CYSTO (CUSTOM PROCEDURE TRAY) ×2 IMPLANT
TUBE CONNECTING 12X1/4 (SUCTIONS) IMPLANT

## 2016-05-09 NOTE — Anesthesia Procedure Notes (Signed)
Procedure Name: LMA Insertion Date/Time: 05/09/2016 11:37 AM Performed by: Wanita Chamberlain Pre-anesthesia Checklist: Patient identified, Timeout performed, Emergency Drugs available, Suction available and Patient being monitored Patient Re-evaluated:Patient Re-evaluated prior to inductionOxygen Delivery Method: Circle system utilized Preoxygenation: Pre-oxygenation with 100% oxygen Intubation Type: IV induction Ventilation: Mask ventilation without difficulty LMA: LMA inserted LMA Size: 4.0 Number of attempts: 2 Placement Confirmation: positive ETCO2 and breath sounds checked- equal and bilateral Tube secured with: Tape Dental Injury: Teeth and Oropharynx as per pre-operative assessment

## 2016-05-09 NOTE — Discharge Instructions (Addendum)
CYSTOSCOPY HOME CARE INSTRUCTIONS  Activity: Rest for the remainder of the day.  Do not drive or operate equipment today.  You may resume normal activities in one to two days as instructed by your physician.   Meals: Drink plenty of liquids and eat light foods such as gelatin or soup this evening.  You may return to a normal meal plan tomorrow.  Return to Work: You may return to work in one to two days or as instructed by your physician.  Special Instructions / Symptoms: Call your physician if any of these symptoms occur:   -persistent or heavy bleeding  -bleeding which continues after first few urination  -large blood clots that are difficult to pass  -urine stream diminishes or stops completely  -fever equal to or higher than 101 degrees Farenheit.  -cloudy urine with a strong, foul odor  -severe pain  Females should always wipe from front to back after elimination.  You may feel some burning pain when you urinate.  This should disappear with time.  Applying moist heat to the lower abdomen or a hot tub bath may help relieve the pain. \     Post Anesthesia Home Care Instructions  Activity: Get plenty of rest for the remainder of the day. A responsible individual must stay with you for 24 hours following the procedure.  For the next 24 hours, DO NOT: -Drive a car -Operate machinery -Drink alcoholic beverages -Take any medication unless instructed by your physician -Make any legal decisions or sign important papers.  Meals: Start with liquid foods such as gelatin or soup. Progress to regular foods as tolerated. Avoid greasy, spicy, heavy foods. If nausea and/or vomiting occur, drink only clear liquids until the nausea and/or vomiting subsides. Call your physician if vomiting continues.  Special Instructions/Symptoms: Your throat may feel dry or sore from the anesthesia or the breathing tube placed in your throat during surgery. If this causes discomfort, gargle with warm salt  water. The discomfort should disappear within 24 hours.  If you had a scopolamine patch placed behind your ear for the management of post- operative nausea and/or vomiting:  1. The medication in the patch is effective for 72 hours, after which it should be removed.  Wrap patch in a tissue and discard in the trash. Wash hands thoroughly with soap and water. 2. You may remove the patch earlier than 72 hours if you experience unpleasant side effects which may include dry mouth, dizziness or visual disturbances. 3. Avoid touching the patch. Wash your hands with soap and water after contact with the patch.     

## 2016-05-09 NOTE — H&P (Signed)
CC: I have hydronephrosis.  HPI: [name] is a 66 year-old female established patient who is here for hydronephrosis.  Her problem was diagnosed 02/19/2016. The problem is on the right side. She had the following x-rays done: Renal Ultrasound and CT Scan.   She is not currently having flank pain, back pain, groin pain, nausea, vomiting, fever or chills.   She has not received radiation therapy.     ALLERGIES: gabapentin - Other Reaction, "makes me feel out of this world" Penicillin - Skin Rash Sulfa - Skin Rash    MEDICATIONS: Aspirin 81 mg tablet, chewable  Hydrochlorothiazide 12.5 mg tablet  Levothyroxine Sodium 88 mcg tablet  Metformin Hcl 1,000 mg tablet  Amlodipine Besylate 10 mg tablet  Glucose Tablets  Insulin Detemir  Liraglutide  Lispro Insulin  Lyrica 50 mg capsule  Magnesium Oxide 400 mg tablet  Meloxicam 15 mg tablet  Potassium Chloride 20 meq tablet, extended release  Pravastatin Sodium 40 mg tablet  Restasis 0.05 % dropperette, single-use drop dispenser  Tylenol 325 mg tablet  Venlafaxine Hcl Er 150 mg tablet, extended release 24 hr  Vitamin D3 1,000 unit tablet     GU PSH: None   NON-GU PSH: Breast mastectomy, Right Cataract surgery, Bilateral Hand/finger Surgery, Bilateral    GU PMH: Hydronephrosis Unspec, She has asymptomatic right hydro of uncertain etiology. I am going to get her set up for a renal US and if the hydro persists, I will consider cystoscopy with retrograde. She will f/u with the [place] results. - 04/10/2016 Incomplete bladder emptying, She required CIC x 2 in the hospital and may have diabetic cystopathy but if voiding without complaints now. I will check a PVR with the [place]. - 04/10/2016    NON-GU PMH: Depression Diabetes Type 2 Glaucoma, Bilateral Hypercholesterolemia Hypothyroidism Malignant neoplasm of unspecified site of unspecified female breast    FAMILY HISTORY: Breast Cancer - Daughter Heart Attack - Daughter Kidney  Stones - Son   SOCIAL HISTORY: Marital Status: Widowed Current Smoking Status: Patient has never smoked.   Tobacco Use Assessment Completed: Used Tobacco in last 30 days? Has never drank.  Drinks 1 caffeinated drink per day. Patient's occupation is/was retired.     Notes: 2 sons 2 daughters   REVIEW OF SYSTEMS:    GU Review Female:   Patient denies frequent urination, hard to postpone urination, burning /pain with urination, get up at night to urinate, leakage of urine, stream starts and stops, trouble starting your stream, have to strain to urinate, and currently pregnant.  Gastrointestinal (Upper):   Patient denies nausea, vomiting, and indigestion/ heartburn.  Gastrointestinal (Lower):   Patient denies diarrhea and constipation.  Constitutional:   Patient denies fever, night sweats, weight loss, and fatigue.  Skin:   Patient denies skin rash/ lesion and itching.  Eyes:   Patient denies blurred vision and double vision.  Ears/ Nose/ Throat:   Patient denies sore throat and sinus problems.  Hematologic/Lymphatic:   Patient denies swollen glands and easy bruising.  Cardiovascular:   Patient denies leg swelling and chest pains.  Respiratory:   Patient denies cough and shortness of breath.  Endocrine:   Patient denies excessive thirst.  Musculoskeletal:   Patient denies back pain and joint pain.  Neurological:   Patient denies headaches and dizziness.  Psychologic:   Patient denies depression and anxiety.   VITAL SIGNS:      04/23/2016 12:09 PM  BP 153/76 mmHg  Pulse 99 /min  Temperature 99.1 F /  37 C   MULTI-SYSTEM PHYSICAL EXAMINATION:    Constitutional: Well-nourished. No physical deformities. Normally developed. Good grooming.   Neurologic / Psychiatric: Oriented to time, oriented to place, oriented to person. No depression, no anxiety, no agitation.   Gastrointestinal: No mass, no tenderness, no rigidity, non obese abdomen.      PAST DATA REVIEWED:  Source Of History:   Patient  Records Review:   Previous Patient Records  Urine Test Review:   Urinalysis   PROCEDURES:         Renal Ultrasound - 85929  Right Kidney:estimated measurements Length: 9.87 cm Depth: 4.60 cm Cortical Width: 1.46 cm Width: 4.77 cm  Left Kidney:estimated measurements Length: 9.04 cm Depth: 5.08 cm Cortical Width: 1.86 cm Width: 3.99cm  Left Kidney/Ureter:  1)Appearance of Dilated Renal Pelvis measuring 1.11cm-----2)0.34cm Mid Pole Hyperechoic Area-----3)Unable to [employer] Lower Pole  Right Kidney/Ureter:  1)Mild to Moderate Hydro-----2)Mid Pole Cystic Area measuring 1.93cm x 1.10cm x 1.53cm  Bladder:  PVR = 28.41ml-----? Area in bladder vs bladder wall      Very limited study and hard to visualize due to bowel shadowing--pt has not had a bowel movement in 2 days         Urinalysis w/Scope - 81001 Dipstick Dipstick Cont'd Micro  Specimen: Voided Bilirubin: Neg WBC/hpf: 0 - 5/hpf  Color: Straw Ketones: Neg RBC/hpf: NS (Not Seen)  Appearance: Cloudy Blood: Neg Bacteria: NS (Not Seen)  Specific Gravity: 1.015 Protein: Neg Cystals: NS (Not Seen)  pH: 6.5 Urobilinogen: 0.2 Casts: NS (Not Seen)  Glucose: Neg Nitrites: Neg Trichomonas: Not Present    Leukocyte Esterase: Neg Mucous: Not Present      Epithelial Cells: 0 - 5/hpf      Yeast: NS (Not Seen)      Sperm: Not Present    ASSESSMENT:      ICD-10 Details  1 GU:   Hydronephrosis Unspec - N13.30 Right, Stable, Chronic - Will have Dr. Jeffie Pollock review  2   Incomplete bladder emptying - R39.14 Improving, Chronic - PVR: 28.41 ml. Bladder is empty.    PLAN:           Document Letter(s):  Created for Patient: Clinical Summary         Notes:   Will have Dr.Andry Bogden review RUS and decide if he recommends monitoring vs proceed with elective cystourethroscopy, (R) RPG for evaluation of hydronephrosis. Instructed pt to resolve constipation with LOC.

## 2016-05-09 NOTE — Anesthesia Postprocedure Evaluation (Signed)
Anesthesia Post Note  Patient: [name]  Procedure(s) Performed: Procedure(s) (LRB): CYSTOSCOPY and right RETROGRADE (Right)  Patient location during evaluation: PACU Anesthesia Type: General Level of consciousness: awake and alert Pain management: pain level controlled Vital Signs Assessment: post-procedure vital signs reviewed and stable Respiratory status: spontaneous breathing, nonlabored ventilation, respiratory function stable and patient connected to nasal cannula oxygen Cardiovascular status: blood pressure returned to baseline and stable Postop Assessment: no signs of nausea or vomiting Anesthetic complications: no       Last Vitals:  Vitals:   05/09/16 1230 05/09/16 1245  BP: 138/71 138/67  Pulse: 86 87  Resp: (!) 0 11  Temp:      Last Pain:  Vitals:   05/09/16 1206  TempSrc:   PainSc: Asleep                 Montez Hageman

## 2016-05-09 NOTE — Transfer of Care (Signed)
Immediate Anesthesia Transfer of Care Note  Patient: [name]  Procedure(s) Performed: Procedure(s): CYSTOSCOPY and right RETROGRADE (Right)  Patient Location: PACU  Anesthesia Type:General  Level of Consciousness: drowsy and patient cooperative  Airway & Oxygen Therapy: Patient Spontanous Breathing and Patient connected to nasal cannula oxygen  Post-op Assessment: Report given to RN and Post -op Vital signs reviewed and stable  Post vital signs: Reviewed and stable  Last Vitals:  Vitals:   05/09/16 0924 05/09/16 1206  BP: (!) 143/75 (!) (P) 141/72  Pulse: 95   Resp: 16   Temp: 36.6 C (P) 36.9 C    Last Pain:  Vitals:   05/09/16 0924  TempSrc: Oral      Patients Stated Pain Goal: 8 (75/10/25 8527)  Complications: No apparent anesthesia complications

## 2016-05-09 NOTE — Anesthesia Preprocedure Evaluation (Addendum)
Anesthesia Evaluation  Patient identified by MRN, date of birth, ID band Patient awake    Reviewed: Allergy & Precautions, NPO status , Patient's Chart, lab work & pertinent test results  Airway Mallampati: II  TM Distance: >3 FB Neck ROM: Full    Dental no notable dental hx. (+) Partial Upper, Teeth Intact, Dental Advisory Given   Pulmonary neg pulmonary ROS,    Pulmonary exam normal breath sounds clear to auscultation       Cardiovascular hypertension, Pt. on medications Normal cardiovascular exam Rhythm:Regular Rate:Normal     Neuro/Psych negative neurological ROS  negative psych ROS   GI/Hepatic negative GI ROS, Neg liver ROS,   Endo/Other  diabetes, Type 2, Insulin DependentHypothyroidism   Renal/GU negative Renal ROSRenal calculus  negative genitourinary   Musculoskeletal negative musculoskeletal ROS (+) Arthritis , Osteoarthritis,    Abdominal   Peds negative pediatric ROS (+)  Hematology negative hematology ROS (+) anemia ,   Anesthesia Other Findings   Reproductive/Obstetrics negative OB ROS                          Anesthesia Physical Anesthesia Plan  ASA: II  Anesthesia Plan: General   Post-op Pain Management:    Induction: Intravenous  Airway Management Planned: LMA  Additional Equipment:   Intra-op Plan:   Post-operative Plan: Extubation in OR  Informed Consent: I have reviewed the patients History and Physical, chart, labs and discussed the procedure including the risks, benefits and alternatives for the proposed anesthesia with the patient or authorized representative who has indicated his/her understanding and acceptance.   Dental advisory given  Plan Discussed with: CRNA  Anesthesia Plan Comments:         Anesthesia Quick Evaluation

## 2016-05-09 NOTE — Brief Op Note (Signed)
05/09/2016  11:52 AM  PATIENT:  [name]  66 y.o. female  PRE-OPERATIVE DIAGNOSIS:  RIGHT HYDRONEPHROSIS  POST-OPERATIVE DIAGNOSIS: Non-obstructive right renal dilation.   PROCEDURE:  Procedure(s): CYSTOSCOPY/RETROGRADE/URETEROSCOPY(Right)  SURGEON:  Surgeon(s) and Role:    * Irine Seal, MD - Primary  PHYSICIAN ASSISTANT:   ASSISTANTS: none   ANESTHESIA:   general  EBL:  No intake/output data recorded.  BLOOD ADMINISTERED:none  DRAINS: none   LOCAL MEDICATIONS USED:  NONE  SPECIMEN:  No Specimen  DISPOSITION OF SPECIMEN:  N/A  COUNTS:  YES  TOURNIQUET:  * No tourniquets in log *  DICTATION: .Other Dictation: Dictation Number [id number]  PLAN OF CARE: Discharge to home after PACU  PATIENT DISPOSITION:  PACU - hemodynamically stable.   Delay start of Pharmacological VTE agent (>24hrs) due to surgical blood loss or risk of bleeding: not applicable

## 2016-05-10 ENCOUNTER — Encounter (HOSPITAL_BASED_OUTPATIENT_CLINIC_OR_DEPARTMENT_OTHER): Payer: Self-pay | Admitting: Urology

## 2016-05-10 NOTE — Op Note (Signed)
NAME:  [name], [name]                    ACCOUNT NO.:  MEDICAL RECORD NO.:  [id number]  LOCATION:                                 FACILITY:  PHYSICIAN:  Marshall Cork. Jeffie Pollock, M.D.    DATE OF BIRTH:  10-29-1950  DATE OF PROCEDURE:  05/09/2016 DATE OF DISCHARGE:                              OPERATIVE REPORT   PROCEDURE:  Cystoscopy with right retrograde pyelogram and interpretation.  PREOPERATIVE DIAGNOSIS:  Right hydronephrosis.  POSTOPERATIVE DIAGNOSIS:  Nonobstructive right renal dilation.  SURGEON:  Marshall Cork. Jeffie Pollock, M.D.  ANESTHESIA:  General.  SPECIMEN:  None.  DRAINS:  None.  BLOOD LOSS:  None.  COMPLICATIONS:  None.  INDICATIONS:  [name] is a 66 year old African American female who was found on a CT scan done for other purposes to have mild right hydroureteronephrosis.  Ultrasound in the office confirmed persistence of dilated collecting system.  It was felt that cystoscopy and retrograde pyelography was indicated since an obvious cause of obstruction was not identified.  The patient was taken to the operating room where general anesthetic was induced.  She was placed in lithotomy position.  Her perineum and genitalia were prepped with Betadine solution.  She was draped in the usual sterile fashion.  She had been given Ancef and fitted with PAS hose.  Cystoscopy was performed using a 23-French scope and 30-degree lens. Examination revealed a normal urethra.  The bladder wall had mild trabeculation.  No tumors, stones, or inflammation were noted.  Ureteral orifices were unremarkable effluxing clear urine.  The right ureteral orifice was cannulated with a 5-French open-end catheter and contrast was instilled.  Retrograde pyelogram revealed a normal distal and mid ureter with some mild fusiform dilation of the proximal ureter and intrarenal collecting system.  However, no filling defects or obvious point of obstruction were noted.  After the retrograde pyelogram was  performed, the kidney was observed fluoroscopically and the ureteral orifice cystoscopically.  There was brisk efflux of contrast material in the kidney decompressed gradually over time but maintained mild dilation but no source of obstruction, no filling defects or other abnormalities were noted.  It was felt that this represented a nonobstructive mild renal dilation and no significant pathology.  At this point, the bladder was drained.  The patient was taken down from lithotomy position.  Her anesthetic was reversed.  She was moved to recovery room in stable condition.  There were no complications.     Marshall Cork. Jeffie Pollock, M.D.     JJW/MEDQ  D:  05/09/2016  T:  05/09/2016  Job:  [id number]

## 2016-05-17 ENCOUNTER — Encounter (INDEPENDENT_AMBULATORY_CARE_PROVIDER_SITE_OTHER): Payer: Medicare Other | Admitting: Ophthalmology

## 2016-05-17 DIAGNOSIS — I10 Essential (primary) hypertension: Secondary | ICD-10-CM | POA: Diagnosis not present

## 2016-05-17 DIAGNOSIS — E11311 Type 2 diabetes mellitus with unspecified diabetic retinopathy with macular edema: Secondary | ICD-10-CM | POA: Diagnosis not present

## 2016-05-17 DIAGNOSIS — E113313 Type 2 diabetes mellitus with moderate nonproliferative diabetic retinopathy with macular edema, bilateral: Secondary | ICD-10-CM

## 2016-05-17 DIAGNOSIS — H35033 Hypertensive retinopathy, bilateral: Secondary | ICD-10-CM | POA: Diagnosis not present

## 2016-05-17 DIAGNOSIS — H43813 Vitreous degeneration, bilateral: Secondary | ICD-10-CM | POA: Diagnosis not present

## 2016-05-20 ENCOUNTER — Other Ambulatory Visit: Payer: Self-pay | Admitting: Internal Medicine

## 2016-05-20 DIAGNOSIS — I10 Essential (primary) hypertension: Secondary | ICD-10-CM

## 2016-05-21 ENCOUNTER — Other Ambulatory Visit: Payer: Self-pay | Admitting: Internal Medicine

## 2016-05-21 DIAGNOSIS — I10 Essential (primary) hypertension: Secondary | ICD-10-CM

## 2016-05-23 DIAGNOSIS — N1330 Unspecified hydronephrosis: Secondary | ICD-10-CM | POA: Diagnosis not present

## 2016-05-24 ENCOUNTER — Other Ambulatory Visit: Payer: Self-pay | Admitting: Internal Medicine

## 2016-05-27 ENCOUNTER — Telehealth: Payer: Self-pay | Admitting: Internal Medicine

## 2016-05-27 NOTE — Telephone Encounter (Signed)
APT. REMINDER CALL, LMTCB °

## 2016-05-28 ENCOUNTER — Ambulatory Visit (INDEPENDENT_AMBULATORY_CARE_PROVIDER_SITE_OTHER): Payer: Medicare Other | Admitting: Internal Medicine

## 2016-05-28 ENCOUNTER — Ambulatory Visit (INDEPENDENT_AMBULATORY_CARE_PROVIDER_SITE_OTHER): Payer: Medicare Other | Admitting: Dietician

## 2016-05-28 ENCOUNTER — Encounter: Payer: Self-pay | Admitting: Internal Medicine

## 2016-05-28 VITALS — BP 129/62 | HR 90 | Temp 98.4°F | Ht 60.0 in | Wt 143.4 lb

## 2016-05-28 DIAGNOSIS — I10 Essential (primary) hypertension: Secondary | ICD-10-CM | POA: Diagnosis not present

## 2016-05-28 DIAGNOSIS — N183 Chronic kidney disease, stage 3 unspecified: Secondary | ICD-10-CM

## 2016-05-28 DIAGNOSIS — E119 Type 2 diabetes mellitus without complications: Secondary | ICD-10-CM

## 2016-05-28 DIAGNOSIS — I129 Hypertensive chronic kidney disease with stage 1 through stage 4 chronic kidney disease, or unspecified chronic kidney disease: Secondary | ICD-10-CM

## 2016-05-28 DIAGNOSIS — E1142 Type 2 diabetes mellitus with diabetic polyneuropathy: Secondary | ICD-10-CM

## 2016-05-28 DIAGNOSIS — Z713 Dietary counseling and surveillance: Secondary | ICD-10-CM

## 2016-05-28 DIAGNOSIS — Z0000 Encounter for general adult medical examination without abnormal findings: Secondary | ICD-10-CM

## 2016-05-28 DIAGNOSIS — Z6828 Body mass index (BMI) 28.0-28.9, adult: Secondary | ICD-10-CM | POA: Diagnosis not present

## 2016-05-28 DIAGNOSIS — E039 Hypothyroidism, unspecified: Secondary | ICD-10-CM

## 2016-05-28 DIAGNOSIS — R338 Other retention of urine: Secondary | ICD-10-CM | POA: Diagnosis not present

## 2016-05-28 DIAGNOSIS — N189 Chronic kidney disease, unspecified: Secondary | ICD-10-CM | POA: Diagnosis not present

## 2016-05-28 DIAGNOSIS — Z794 Long term (current) use of insulin: Secondary | ICD-10-CM

## 2016-05-28 DIAGNOSIS — E785 Hyperlipidemia, unspecified: Secondary | ICD-10-CM

## 2016-05-28 DIAGNOSIS — Z79899 Other long term (current) drug therapy: Secondary | ICD-10-CM | POA: Diagnosis not present

## 2016-05-28 DIAGNOSIS — E1122 Type 2 diabetes mellitus with diabetic chronic kidney disease: Secondary | ICD-10-CM

## 2016-05-28 LAB — POCT GLYCOSYLATED HEMOGLOBIN (HGB A1C): Hemoglobin A1C: 7.7

## 2016-05-28 LAB — GLUCOSE, CAPILLARY: Glucose-Capillary: 111 mg/dL — ABNORMAL HIGH (ref 65–99)

## 2016-05-28 MED ORDER — ROSUVASTATIN CALCIUM 20 MG PO TABS: 20.0000 mg | ORAL_TABLET | Freq: Every day | ORAL | 0 refills | Status: DC

## 2016-05-28 NOTE — Patient Instructions (Signed)
Try taking the lower meal dose for the scrambled egg  And grits breakfast.  Try to Check  Blood sugar 2 hours after both the cereal and banana and milk breakfast and the scrambled egg, grits breakfast.    Good job!

## 2016-05-28 NOTE — Assessment & Plan Note (Signed)
-   Patient had a recent BMP done on 05/09/2016 which showed a creatinine of 1 - We will consider checking her BMP on her follow-up visit but her creatinine remained stable for now

## 2016-05-28 NOTE — Assessment & Plan Note (Signed)
BP Readings from Last 3 Encounters:  05/28/16 129/62  05/09/16 127/66  03/07/16 122/80    Lab Results  Component Value Date   NA 143 05/09/2016   K 3.6 05/09/2016   CREATININE 1.00 05/09/2016    Assessment: Blood pressure control:  well-controlled Progress toward BP goal:   at goal Comments: Patient is compliant with her amlodipine 10 mg and hydrochlorothiazide 12.5 mg  Plan: Medications:  continue current medications Educational resources provided:   Self management tools provided:   Other plans: We'll check BMP at next visit

## 2016-05-28 NOTE — Assessment & Plan Note (Signed)
-   Patient had stopped taking her pravastatin as she said this interfered with her memory and that this is improved since stopping the medication - We will switch her to rosuvastatin 20 mg and recheck her lipid panel today

## 2016-05-28 NOTE — Patient Instructions (Signed)
-   It was a pleasure seeing you today - I have stopped your pravastatin and started you on crestor - We will schedule your bone scan for you - We will check some blood work and urine tests today - Please follow up with your eye doctor and endocrinologist

## 2016-05-28 NOTE — Progress Notes (Signed)
   Subjective:    Patient ID: [name], female    DOB: 06-04-1950, 66 y.o.   MRN: [id number]  HPI Has seen and examined this patient. Patient is here for routine follow-up of her diabetes and hypertension. She has no new complaints at this time and feels well. She does want to schedule her DEXA scan on this visit.  Of note, patient has follow-up with urology since our last visit and had a cystoscopy with right retrograde pyelogram for her mild right hydronephrosis which showed a nonobstructive right renal dilation. She reports no adverse events associated with this procedure.  Review of Systems  Constitutional: Negative.   HENT: Negative.   Respiratory: Negative.   Cardiovascular: Negative.   Gastrointestinal: Negative.   Genitourinary: Negative.   Musculoskeletal: Negative.   Neurological: Negative.   Psychiatric/Behavioral: Negative.        Objective:   Physical Exam  Constitutional: She is oriented to person, place, and time. She appears well-developed and well-nourished.  HENT:  Head: Normocephalic and atraumatic.  Mouth/Throat: No oropharyngeal exudate.  Neck: Neck supple.  Cardiovascular: Normal rate, regular rhythm and normal heart sounds.   Pulmonary/Chest: Effort normal and breath sounds normal. No respiratory distress. She has no wheezes.  Abdominal: Soft. Bowel sounds are normal. She exhibits no distension. There is no tenderness.  Musculoskeletal: Normal range of motion. She exhibits no edema.  Lymphadenopathy:    She has no cervical adenopathy.  Neurological: She is alert and oriented to person, place, and time.  Skin: Skin is warm. No rash noted. No erythema.  Psychiatric: She has a normal mood and affect. Her behavior is normal.          Assessment & Plan:  Please see problem based charting for assessment and plan:

## 2016-05-28 NOTE — Assessment & Plan Note (Addendum)
Lab Results  Component Value Date   HGBA1C 7.7 05/28/2016   HGBA1C 8.7 (H) 02/20/2016   HGBA1C 11.7 12/01/2015     Assessment: Diabetes control:  fair Progress toward A1C goal:   improved Comments: Patient follows up with Dr. Renne Crigler as an outpatient. A1c is improved to 7.7. Patient with episodes of hypoglycemia and will need to have her medications adjusted by her endocrinologist who she will follow-up with next week   Plan: Medications:  continue current medications Home glucose monitoring: Frequency:   Timing:   Instruction/counseling given: reminded to get eye exam, reminded to bring blood glucose meter & log to each visit and discussed foot care Educational resources provided:   Self management tools provided:   Other plans: We'll check a BMP at next visit. Foot exam done today in clinic. We'll check urine microalbumin  Addendum: - Patient noted to have microalbuminuria of 43. I will repeat on next visit. If remains elevated will add ACE-I to regimen

## 2016-05-28 NOTE — Assessment & Plan Note (Signed)
-   Patient had an elevated TSH on her last visit in January up to 10 - Her Synthroid was increased to 88 g after that visit - She reports no fatigue or constipation or other symptoms associated with hypothyroidism at this time - We will check repeat TSH and free T4 at this visit

## 2016-05-28 NOTE — Assessment & Plan Note (Signed)
-   Patient scheduled for a DEXA scan today. We'll follow-up results

## 2016-05-28 NOTE — Assessment & Plan Note (Signed)
-   Patient was referred to urology after her last visit for a mild right-sided hydroureteronephrosis - She follow-up with urology and had an ultrasound done which showed persistent hydronephrosis - Patient then had a cystoscopy with a right retrograde pyelogram which showed nonobstructive right-sided dilation - She'll continue to follow up with urology for this but has no urological complaints at this time. - We'll continue to monitor

## 2016-05-28 NOTE — Progress Notes (Signed)
  Medical Nutrition Therapy:  Appt start time: 1050 end time: 1125  Assessment:  Primary concerns today: blood sugar control. [name] reports all is well with her diabetes. She is taking 4 units of Humalog for ~ 15 g carb, egg meal and 2 units for cereal, milk and banana meal. She was abel to identify most foods with carbs and knew carbs raised blood sugar.  She denies barriers to checking blood glucose except that she gets busy. Reminders may be helpful, but her meter does not have reminders except when used with one touch reveal APP. Patient prefers to use her tablet for the one touch App which she says she'll bring to her next visit. A second meter was provided for patient today to carry in her purse to encourage additional glucose checks.   WEIGHT: 143.4#, BMI 28   desired weight is 140-150# BLOOD SUGARS: A1C 7.7% down from 8.7%.Goal per Epic is <7%, meter download shows many hypoglycemic episodes. Most ~ noon MEDICATIONS: levemir to 15 units at night- she admits to falling asleep and missing occasionally, Victoza 1.8 mg qam,  metformin twice daily, Humalog 2 units for a snack of cereal, milk & banana. Humalog 2 units for small meal, 4 units for larger meal.  DIETARY INTAKE: Her meal pattern is variable.   Breakfast 10-11:  1 egg, 1/2 cup grits, 1/2 sausage, water and coffee with liquid creamer x 4 TBSP (25-35g carb)  or cereal, milk & 1/2 banana & coffee (~50-60 g carb)  Lunch skips often Snack: peanut butter crackers,raisins or prunes.  Dinner is 6-7-  [place], baked chicken, green beans, water, denies bread or dessert  Estimated daily energy needs: ~ 1400-1600 calories ~ 150+/-10 g carbohydrates  Progress Towards Goal(s):  In progress.   Nutritional Diagnosis:  [place]-2.2 Altered nutrition-related laboratory and [place]-2.4 Predicted food-medication interaction As related to lack of adequate blood sugar control is improving, still not at goal 7%  As evidenced by A1C of 8.6%l .    Intervention:   Nutrition education: reviewed meter download with patient, reasons for checking post meal blood sugars and more often to prevent hypoglycemia.   Coordination of care- she may benefit from the [employer] given during visit include: picture poster of foods and portion sizes of foods that increase blood sugar. AVS Monitoring/Evaluation:  Dietary intake, exercise, meter, and body weight in 3 months(s) Plyler, [name], RD 05/28/2016 1:47 PM.

## 2016-05-29 ENCOUNTER — Telehealth: Payer: Self-pay | Admitting: Internal Medicine

## 2016-05-29 LAB — HIV ANTIBODY (ROUTINE TESTING W REFLEX): HIV Screen 4th Generation wRfx: NONREACTIVE

## 2016-05-29 LAB — MICROALBUMIN / CREATININE URINE RATIO
Creatinine, Urine: 68 mg/dL
Microalb/Creat Ratio: 43.7 mg/g creat — ABNORMAL HIGH (ref 0.0–30.0)
Microalbumin, Urine: 29.7 ug/mL

## 2016-05-29 LAB — LIPID PANEL
Chol/HDL Ratio: 3.5 ratio (ref 0.0–4.4)
Cholesterol, Total: 183 mg/dL (ref 100–199)
HDL: 52 mg/dL (ref >39)
LDL Calculated: 115 mg/dL — ABNORMAL HIGH (ref 0–99)
Triglycerides: 82 mg/dL (ref 0–149)
VLDL Cholesterol Cal: 16 mg/dL (ref 5–40)

## 2016-05-29 LAB — TSH: TSH: 1.05 u[IU]/mL (ref 0.450–4.500)

## 2016-05-29 LAB — T4, FREE: Free T4: 1.4 ng/dL (ref 0.82–1.77)

## 2016-05-29 NOTE — Telephone Encounter (Signed)
I called the patient to discuss the results of her bloodwork. The TSH and free T4 are wnl and her HIV is non reactive. Her lipid panel was noted and her calculated 10 year ASCVD risk score was 19%. We had switched her yesterday to a high intensity statin. Will continue with this for now. Patient expresses understanding with plan and is in agreement.

## 2016-05-31 ENCOUNTER — Other Ambulatory Visit: Payer: Medicare Other

## 2016-06-04 ENCOUNTER — Ambulatory Visit: Payer: Medicare Other | Admitting: Internal Medicine

## 2016-06-06 ENCOUNTER — Telehealth: Payer: Self-pay | Admitting: Internal Medicine

## 2016-06-06 ENCOUNTER — Ambulatory Visit
Admission: RE | Admit: 2016-06-06 | Discharge: 2016-06-06 | Disposition: A | Discharge status: Home / Self Care | Payer: Medicare Other | Source: Ambulatory Visit | Attending: Internal Medicine | Admitting: Internal Medicine

## 2016-06-06 DIAGNOSIS — E2839 Other primary ovarian failure: Secondary | ICD-10-CM

## 2016-06-06 DIAGNOSIS — M8589 Other specified disorders of bone density and structure, multiple sites: Secondary | ICD-10-CM | POA: Diagnosis not present

## 2016-06-06 DIAGNOSIS — M85851 Other specified disorders of bone density and structure, right thigh: Secondary | ICD-10-CM

## 2016-06-06 DIAGNOSIS — Z780 Asymptomatic menopausal state: Secondary | ICD-10-CM | POA: Diagnosis not present

## 2016-06-06 MED ORDER — CALCIUM CITRATE-VITAMIN D 315-200 MG-UNIT PO TABS: 1.0000 | ORAL_TABLET | Freq: Two times a day (BID) | ORAL | 3 refills | Status: DC

## 2016-06-06 NOTE — Telephone Encounter (Signed)
Called patient to discuss results of DEXA scan - shows osteopenia with a -1.6 at R femoral neck and -1.9 in left forearm and a 10 yr FRAX score of 0.5% for a hip fracture and 4.1% for a major osteoporotic fracture. Recommended low dose calcium and Vitamin D supplementation. She is in agreement with this plan

## 2016-06-12 ENCOUNTER — Encounter (INDEPENDENT_AMBULATORY_CARE_PROVIDER_SITE_OTHER): Payer: Medicare Other | Admitting: Ophthalmology

## 2016-06-28 ENCOUNTER — Encounter (INDEPENDENT_AMBULATORY_CARE_PROVIDER_SITE_OTHER): Payer: Medicare Other | Admitting: Ophthalmology

## 2016-06-28 ENCOUNTER — Other Ambulatory Visit: Payer: Self-pay | Admitting: Internal Medicine

## 2016-06-28 DIAGNOSIS — E113312 Type 2 diabetes mellitus with moderate nonproliferative diabetic retinopathy with macular edema, left eye: Secondary | ICD-10-CM

## 2016-06-28 DIAGNOSIS — H43813 Vitreous degeneration, bilateral: Secondary | ICD-10-CM

## 2016-06-28 DIAGNOSIS — I10 Essential (primary) hypertension: Secondary | ICD-10-CM

## 2016-06-28 DIAGNOSIS — E11311 Type 2 diabetes mellitus with unspecified diabetic retinopathy with macular edema: Secondary | ICD-10-CM

## 2016-06-28 DIAGNOSIS — H35033 Hypertensive retinopathy, bilateral: Secondary | ICD-10-CM

## 2016-06-28 DIAGNOSIS — E113391 Type 2 diabetes mellitus with moderate nonproliferative diabetic retinopathy without macular edema, right eye: Secondary | ICD-10-CM | POA: Diagnosis not present

## 2016-07-11 ENCOUNTER — Encounter (INDEPENDENT_AMBULATORY_CARE_PROVIDER_SITE_OTHER): Payer: Medicare Other | Admitting: Ophthalmology

## 2016-07-11 DIAGNOSIS — S0502XA Injury of conjunctiva and corneal abrasion without foreign body, left eye, initial encounter: Secondary | ICD-10-CM

## 2016-07-11 DIAGNOSIS — I10 Essential (primary) hypertension: Secondary | ICD-10-CM | POA: Diagnosis not present

## 2016-07-11 DIAGNOSIS — E113312 Type 2 diabetes mellitus with moderate nonproliferative diabetic retinopathy with macular edema, left eye: Secondary | ICD-10-CM

## 2016-07-11 DIAGNOSIS — E11311 Type 2 diabetes mellitus with unspecified diabetic retinopathy with macular edema: Secondary | ICD-10-CM

## 2016-07-11 DIAGNOSIS — Z961 Presence of intraocular lens: Secondary | ICD-10-CM | POA: Diagnosis not present

## 2016-07-11 DIAGNOSIS — E113391 Type 2 diabetes mellitus with moderate nonproliferative diabetic retinopathy without macular edema, right eye: Secondary | ICD-10-CM

## 2016-07-11 DIAGNOSIS — H35033 Hypertensive retinopathy, bilateral: Secondary | ICD-10-CM | POA: Diagnosis not present

## 2016-07-12 ENCOUNTER — Other Ambulatory Visit: Payer: Self-pay | Admitting: Internal Medicine

## 2016-07-12 DIAGNOSIS — E1149 Type 2 diabetes mellitus with other diabetic neurological complication: Secondary | ICD-10-CM

## 2016-07-15 DIAGNOSIS — S0502XA Injury of conjunctiva and corneal abrasion without foreign body, left eye, initial encounter: Secondary | ICD-10-CM | POA: Diagnosis not present

## 2016-07-15 DIAGNOSIS — H16223 Keratoconjunctivitis sicca, not specified as Sjogren's, bilateral: Secondary | ICD-10-CM | POA: Diagnosis not present

## 2016-07-24 DIAGNOSIS — H16223 Keratoconjunctivitis sicca, not specified as Sjogren's, bilateral: Secondary | ICD-10-CM | POA: Diagnosis not present

## 2016-07-24 DIAGNOSIS — S0502XA Injury of conjunctiva and corneal abrasion without foreign body, left eye, initial encounter: Secondary | ICD-10-CM | POA: Diagnosis not present

## 2016-07-26 ENCOUNTER — Encounter (INDEPENDENT_AMBULATORY_CARE_PROVIDER_SITE_OTHER): Payer: Medicare Other | Admitting: Ophthalmology

## 2016-07-29 ENCOUNTER — Encounter (INDEPENDENT_AMBULATORY_CARE_PROVIDER_SITE_OTHER): Payer: Medicare Other | Admitting: Ophthalmology

## 2016-08-05 ENCOUNTER — Other Ambulatory Visit: Payer: Self-pay | Admitting: Internal Medicine

## 2016-08-07 ENCOUNTER — Encounter (HOSPITAL_COMMUNITY): Payer: Self-pay

## 2016-08-07 ENCOUNTER — Ambulatory Visit: Payer: Medicare Other | Admitting: Podiatry

## 2016-08-09 ENCOUNTER — Encounter (INDEPENDENT_AMBULATORY_CARE_PROVIDER_SITE_OTHER): Payer: Medicare Other | Admitting: Ophthalmology

## 2016-08-15 ENCOUNTER — Encounter: Payer: Self-pay | Admitting: Adult Health

## 2016-08-15 ENCOUNTER — Ambulatory Visit (HOSPITAL_BASED_OUTPATIENT_CLINIC_OR_DEPARTMENT_OTHER): Payer: Medicare Other | Admitting: Adult Health

## 2016-08-15 ENCOUNTER — Encounter: Payer: Medicare Other | Admitting: Adult Health

## 2016-08-15 VITALS — BP 152/65 | HR 96 | Temp 98.3°F | Resp 20 | Ht 60.0 in | Wt 131.6 lb

## 2016-08-15 DIAGNOSIS — Z853 Personal history of malignant neoplasm of breast: Secondary | ICD-10-CM

## 2016-08-15 DIAGNOSIS — Z9221 Personal history of antineoplastic chemotherapy: Secondary | ICD-10-CM

## 2016-08-15 DIAGNOSIS — Z1239 Encounter for other screening for malignant neoplasm of breast: Secondary | ICD-10-CM

## 2016-08-15 DIAGNOSIS — Z421 Encounter for breast reconstruction following mastectomy: Secondary | ICD-10-CM | POA: Diagnosis not present

## 2016-08-15 NOTE — Progress Notes (Signed)
CLINIC:  Survivorship   REASON FOR VISIT:  Routine follow-up for history of breast cancer.   BRIEF ONCOLOGIC HISTORY:  Per Dr. Calton Dach last note in 07/2015: This patient was diagnosed with breast cancer of the incidental finding of a lump on her chest when she presented to her doctor with coughing. I do not have all the available records. She was initially diagnosed with a Stage III B lymph node positive cancer of the right breast in April of 1999, treated with induction chemotherapy followed by a right mastectomy then radiation and additional chemotherapy. She achieved a durable response with no evidence for recurrence. Due to her young age, she had genetic counselor and was tested negative for BRCA mutation. She never received adjuvant tamoxifen because she recalled that he was issued a receptor negative. The patient has 4 children and her first child was born at age of 61. She never breast-feed any of her children. She did not recall taking hormone replacement or birth control pill. She had a partial hysterectomy at a young age due to heavy menstruation.  INTERVAL HISTORY:  Ms. Depierro presents to the Survivorship Clinic today for routine follow-up for her history of breast cancer.  Overall, she reports feeling quite well.  She is not having any issues today.  She sees her primary care doctor regularly.  She does not have pap smears anymore due to having a hysterectomy in the 1970s.  She exercises regularly.      REVIEW OF SYSTEMS:  Review of Systems  Constitutional: Negative for appetite change, chills, diaphoresis, fatigue, fever and unexpected weight change.  HENT:   Negative for hearing loss and lump/mass.   Eyes: Negative for eye problems and icterus.  Respiratory: Negative for chest tightness, cough and shortness of breath.   Cardiovascular: Negative for chest pain, leg swelling and palpitations.  Gastrointestinal: Negative for abdominal distention, abdominal pain, constipation,  diarrhea, nausea and vomiting.  Endocrine: Negative for hot flashes.  Genitourinary: Negative for difficulty urinating.   Musculoskeletal: Negative for arthralgias.  Skin: Negative for itching and rash.  Neurological: Negative for dizziness, extremity weakness, headaches and numbness.  Hematological: Negative for adenopathy. Does not bruise/bleed easily.  Psychiatric/Behavioral: Negative for depression. The patient is not nervous/anxious.   Breast: Denies any new nodularity, masses, tenderness, nipple changes, or nipple discharge.       PAST MEDICAL/SURGICAL HISTORY:  Past Medical History:  Diagnosis Date  . Arthritis   . Borderline glaucoma of both eyes   . Depression   . Diabetic polyneuropathy ([place])   . Diverticulosis of colon   . Dry eyes, bilateral   . Feeling of incomplete bladder emptying   . History of breast cancer oncologist-  dr Waymon Budge-- per lov note no recurrence   dx 04/ 1999 --- Stage 3B-- s/p  chemotherapy then right mastectomy then concurrent chemoradiation therapy  . History of urinary retention    01/ 2018  . Hydronephrosis of right kidney   . Hyperlipidemia   . Hypertension   . Hypothyroidism   . Insulin dependent type 2 diabetes mellitus [facility])    endocrinologist-  dr Cruzita Lederer---  last A1c 8.7 on 02-20-2016  . Lymphedema of upper extremity    right  . Macular degeneration, left eye    followed by dr Zigmund Daniel  . Renal insufficiency   . Urgency of urination    Past Surgical History:  Procedure Laterality Date  . CARPAL TUNNEL RELEASE Right 2017   and tendon repair  . CATARACT EXTRACTION  W/ INTRAOCULAR LENS  IMPLANT, BILATERAL  2017  . CYSTO/  RIGHT RETROGRADE PYELOGRAM/  UNROOFING RIGHT URETEROCELE  09/18/2000  . CYSTOSCOPY/RETROGRADE/URETEROSCOPY Right 05/09/2016   Procedure: CYSTOSCOPY and right RETROGRADE;  Surgeon: Irine Seal, MD;  Location: [facility];  Service: Urology;  Laterality: Right;  . FINGER SURGERY Left x2 prior to  09-09-2014  . I&D EXTREMITY Left 09/09/2014   Procedure: IRRIGATION AND DEBRIDEMENT LEFT THUMB DISTAL PHALANX;  Surgeon: Daryll Brod, MD;  Location: [place];  Service: Orthopedics;  Laterality: Left;  [name] MASTECTOMY Right 1999   w/  Node dissection's  . PORT-A-CATH REMOVAL  05/01/1999  . TUBAL LIGATION    . VAGINAL HYSTERECTOMY  1970's  . WRIST GANGLION EXCISION Right 2016     ALLERGIES:  Allergies  Allergen Reactions  . Gabapentin Other (See Comments)    Dizziness from 300 mg, tolerates 100 mg  . Penicillins Rash  . Sulfa Antibiotics Rash     CURRENT MEDICATIONS:  Outpatient Encounter Prescriptions as of 08/15/2016  Medication Sig  . amLODipine (NORVASC) 10 MG tablet TAKE 1 TABLET(10 MG) BY MOUTH DAILY  . aspirin EC 81 MG tablet Take 81 mg by mouth at bedtime.  . BD PEN NEEDLE NANO U/F 32G X 4 MM MISC USE AS DIRECTED FOR INSULIN EVERY NIGHT AT BEDTIME  . calcium citrate-vitamin D (CITRACAL+D) 315-200 MG-UNIT tablet Take 1 tablet by mouth 2 (two) times daily.  . Cholecalciferol (VITAMIN D3) 2000 units TABS Take 1 tablet by mouth daily.  . cycloSPORINE (RESTASIS) 0.05 % ophthalmic emulsion Place 1 drop into both eyes 2 (two) times daily.  [name] glucose 4 GM chewable tablet Chew 4 tablets (16 g total) by mouth as needed for low blood sugar.  . hydrochlorothiazide (MICROZIDE) 12.5 MG capsule TAKE 1 CAPSULE BY MOUTH EVERY DAY  . Insulin Detemir (LEVEMIR FLEXTOUCH) 100 UNIT/ML Pen Inject 15 Units into the skin daily at 10 pm.  . insulin lispro (HUMALOG KWIKPEN) 100 UNIT/ML KiwkPen Take 3-4U three times a day before meals.  [name] levothyroxine (SYNTHROID, LEVOTHROID) 88 MCG tablet TAKE 1 TABLET BY MOUTH EVERY MORNING BEFORE BREAKFAST  . MAPAP 325 MG tablet TAKE 1 TABLET(325 MG) BY MOUTH EVERY 8 HOURS AS NEEDED FOR PAIN  . metFORMIN (GLUCOPHAGE) 1000 MG tablet TAKE 1 TABLET(1000 MG) BY MOUTH TWICE DAILY WITH A MEAL  . Olopatadine HCl (PATADAY) 0.2 % SOLN Apply to eye as needed.    Glory Rosebush DELICA LANCETS FINE MISC USE TO CHECK BLOOD SUGAR TWICE DAILY  . ONETOUCH VERIO test strip USE TO CHECK BLOOD SUGAR TWICE DAILY  . Polyethyl Glycol-Propyl Glycol (SYSTANE) 0.4-0.3 % GEL ophthalmic gel Place 1 application into both eyes as needed.  . Polyethylene Glycol 3350 (MIRALAX PO) Take by mouth daily.  . potassium chloride 20 MEQ/15ML (10%) SOLN Take 15 mLs (20 mEq total) by mouth daily as needed (for muscle cramps).  . pregabalin (LYRICA) 50 MG capsule Take 1 capsule (50 mg total) by mouth 3 (three) times daily.  . rosuvastatin (CRESTOR) 20 MG tablet Take 1 tablet (20 mg total) by mouth daily.  [name] venlafaxine XR (EFFEXOR-XR) 150 MG 24 hr capsule TAKE 1 CAPSULE(150 MG) BY MOUTH DAILY WITH BREAKFAST  . VICTOZA 18 MG/3ML SOPN ADMINISTER 1.8 MG UNDER THE SKIN DAILY   No facility-administered encounter medications on file as of 08/15/2016.      ONCOLOGIC FAMILY HISTORY:  Family History  Problem Relation Age of Onset  . Heart disease Father   .  Diabetes Father   . Stroke Sister   . Anuerysm Sister 31       brain; maternal half-sister  . Heart disease Brother   . Anuerysm Brother 72       aortic; maternal half-brother  . Breast cancer Daughter 67       negative genetic testing in 2015  . Heart attack Daughter        22-47  . Diabetes Paternal Grandmother   . Stroke Paternal Grandfather   . Anuerysm Brother        NOS type; full brother  . Fibroids Daughter        s/p hysterectomy at 4y  . Brain cancer Maternal Uncle        dx. older than 65; NOS type  . Brain cancer Cousin        maternal 1st cousin; d. early 3s; NOS type  . Deafness Paternal Uncle        prelingual    GENETIC COuNSELING/TESTING: Updated testing in 08/2015: Genetic testing was normal, and did not reveal a deleterious mutation. Additionally, no variants of uncertain significance (VUSes) were found.  Genes tested include: ATM, BARD1, BRCA1, BRCA2, BRIP1, CDH1, CHEK2, FANCC, MLH1, MSH2, MSH6, NBN,  PALB2, PMS2, PTEN, RAD51C, RAD51D, TP53, and XRCC2.  This panel also includes deletion/duplication analysis (without sequencing) for one gene, EPCAM.  SOCIAL HISTORY:  Weronika Birch is widowed and lives with her daughter [place]in, [place].  She has 4 children and they live in [place], [place].  Ms. Hatchell is currently retired.  She denies any current or history of tobacco, alcohol, or illicit drug use.     PHYSICAL EXAMINATION:  Vital Signs: Vitals:   08/15/16 1327  BP: (!) 152/65  Pulse: 96  Resp: 20  Temp: 98.3 F (36.8 C)   Filed Weights   08/15/16 1327  Weight: 131 lb 9.6 oz (59.7 kg)   General: Well-nourished, well-appearing female in no acute distress.  Unaccompanied today.   HEENT: Head is normocephalic.  Pupils equal and reactive to light. Conjunctivae clear without exudate.  Sclerae anicteric. Oral mucosa is pink, moist.  Oropharynx is pink without lesions or erythema.  Lymph: No cervical, supraclavicular, or infraclavicular lymphadenopathy noted on palpation.  Cardiovascular: Regular rate and rhythm.[name] Respiratory: Clear to auscultation bilaterally. Chest expansion symmetric; breathing non-labored.  Breast Exam:  -Left breast: No appreciable masses on palpation. No skin redness, thickening, or peau d'orange appearance; no nipple retraction or nipple discharge -Right breast: surgically absent, no nodularity or sign of recurrence -Axilla: No axillary adenopathy bilaterally.  GI: Abdomen soft and round; non-tender, non-distended. Bowel sounds normoactive. No hepatosplenomegaly.   GU: Deferred.  Neuro: No focal deficits. Steady gait.  Psych: Mood and affect normal and appropriate for situation.  MSK: No focal spinal tenderness to palpation, full range of motion in bilateral upper extremities Extremities: No edema. Skin: Warm and dry.  LABORATORY DATA:  None for this visit   DIAGNOSTIC IMAGING:  Most recent mammogram:      ASSESSMENT AND PLAN:  Ms.. Seiple is a  pleasant 66 y.o. female with history of Stage IIIB right/left breast invasive ductal carcinoma, diagnosed in 1999, treated with induction chemotherapy followed by a right mastectomy then radiation and additional chemotherapy .  She presents to the Survivorship Clinic for surveillance and routine follow-up.   1. History of breast cancer:  Ms. Hollon is currently clinically and radiographically without evidence of disease or recurrence of breast cancer. She will be due for mammogram  in 08/2016.  I encouraged her to call me with any questions or concerns before her next visit at the cancer center, and I would be happy to see her sooner, if needed.    2. Bone health:  Given Ms. Strahm age, history of breast cancer, she is at risk for bone demineralization. Her last DEXA scan was a few months ago and she is unsure of the results.  She was given education on specific food and activities to promote bone health.  I will defer to her PCP regarding bone density testing and management.   3. Cancer screening:  Due to Ms. Berens's history and her age, she should receive screening for skin cancers, colon cancer, and gynecologic cancers. She was encouraged to follow-up with her PCP for appropriate cancer screenings.   4. Health maintenance and wellness promotion: Ms. Gracy was encouraged to consume 5-7 servings of fruits and vegetables per day. She was also encouraged to engage in moderate to vigorous exercise for 30 minutes per day most days of the week. She was instructed to limit her alcohol consumption and continue to abstain from tobacco use.    Dispo:  -Return to cancer center in one year for LTS follow up -Left breast screening mammogram in 08/2016   A total of (30) minutes of face-to-face time was spent with this patient with greater than 50% of that time in counseling and care-coordination.   Gardenia Phlegm, [place] [phone]   Note: PRIMARY CARE  PROVIDER Aldine Contes, [place] [phone]

## 2016-08-19 ENCOUNTER — Other Ambulatory Visit: Payer: Self-pay | Admitting: Internal Medicine

## 2016-08-19 DIAGNOSIS — Z1231 Encounter for screening mammogram for malignant neoplasm of breast: Secondary | ICD-10-CM

## 2016-08-25 ENCOUNTER — Other Ambulatory Visit: Payer: Self-pay | Admitting: Internal Medicine

## 2016-08-28 DIAGNOSIS — R3914 Feeling of incomplete bladder emptying: Secondary | ICD-10-CM | POA: Diagnosis not present

## 2016-08-28 DIAGNOSIS — N1330 Unspecified hydronephrosis: Secondary | ICD-10-CM | POA: Diagnosis not present

## 2016-08-29 ENCOUNTER — Ambulatory Visit (INDEPENDENT_AMBULATORY_CARE_PROVIDER_SITE_OTHER): Payer: Medicare Other | Admitting: Podiatry

## 2016-08-29 ENCOUNTER — Other Ambulatory Visit: Payer: Self-pay | Admitting: Internal Medicine

## 2016-08-29 DIAGNOSIS — E1149 Type 2 diabetes mellitus with other diabetic neurological complication: Secondary | ICD-10-CM | POA: Diagnosis not present

## 2016-08-29 DIAGNOSIS — M79676 Pain in unspecified toe(s): Secondary | ICD-10-CM

## 2016-08-29 DIAGNOSIS — B351 Tinea unguium: Secondary | ICD-10-CM | POA: Diagnosis not present

## 2016-08-29 NOTE — Progress Notes (Signed)
Patient ID: [name], female   DOB: 21-Jul-1950, 66 y.o.   MRN: [id number] Complaint:  Visit Type: Patient returns to my office for continued preventative foot care services. Complaint: Patient states" my nails have grown long and thick and become painful to walk and wear shoes" Patient has been diagnosed with DM with no foot complications. The patient presents for preventative foot care services. No changes to ROS.  Painful callus under the ball of left foot.  Podiatric Exam: Vascular: dorsalis pedis and posterior tibial pulses are palpable bilateral. Capillary return is immediate. Temperature gradient is WNL. Skin turgor WNL  Sensorium: Normal Semmes Weinstein monofilament test. Normal tactile sensation bilaterally. Nail Exam: Pt has thick disfigured discolored nails with subungual debris noted bilateral entire nail hallux through fifth toenails Ulcer Exam: There is no evidence of ulcer or pre-ulcerative changes or infection. Orthopedic Exam: Muscle tone and strength are WNL. No limitations in general ROM. No crepitus or effusions noted. Foot type and digits show no abnormalities. Bony prominences are unremarkable. Skin: No Porokeratosis. No infection or ulcers.  Heloma durum fifth toes B/L s/p surgery by Little Ishikawa.  symptomatic porokeratosis sub 1 left foot.  Diagnosis:  Onychomycosis, , Pain in right toe, pain in left toes,  Heloma durum  B/L   Treatment & Plan Procedures and Treatment: Consent by patient was obtained for treatment procedures. The patient understood the discussion of treatment and procedures well. All questions were answered thoroughly reviewed. Debridement of mycotic and hypertrophic toenails, 1 through 5 bilateral and clearing of subungual debris. No ulceration, no infection noted.  Return Visit-Office Procedure: Patient instructed to return to the office for a follow up visit 3 months for continued evaluation and treatment.  [name] DPM

## 2016-09-03 ENCOUNTER — Encounter: Payer: Self-pay | Admitting: Internal Medicine

## 2016-09-03 ENCOUNTER — Ambulatory Visit (INDEPENDENT_AMBULATORY_CARE_PROVIDER_SITE_OTHER): Payer: Medicare Other | Admitting: Internal Medicine

## 2016-09-03 ENCOUNTER — Encounter: Payer: Self-pay | Admitting: *Deleted

## 2016-09-03 ENCOUNTER — Ambulatory Visit: Payer: Medicare Other | Admitting: Dietician

## 2016-09-03 VITALS — BP 127/66 | HR 86 | Temp 97.5°F | Ht 60.0 in | Wt 132.3 lb

## 2016-09-03 DIAGNOSIS — Z6379 Other stressful life events affecting family and household: Secondary | ICD-10-CM | POA: Diagnosis not present

## 2016-09-03 DIAGNOSIS — Z91128 Patient's intentional underdosing of medication regimen for other reason: Secondary | ICD-10-CM

## 2016-09-03 DIAGNOSIS — I129 Hypertensive chronic kidney disease with stage 1 through stage 4 chronic kidney disease, or unspecified chronic kidney disease: Secondary | ICD-10-CM

## 2016-09-03 DIAGNOSIS — F329 Major depressive disorder, single episode, unspecified: Secondary | ICD-10-CM

## 2016-09-03 DIAGNOSIS — N189 Chronic kidney disease, unspecified: Secondary | ICD-10-CM

## 2016-09-03 DIAGNOSIS — N183 Chronic kidney disease, stage 3 unspecified: Secondary | ICD-10-CM

## 2016-09-03 DIAGNOSIS — Z794 Long term (current) use of insulin: Secondary | ICD-10-CM | POA: Diagnosis not present

## 2016-09-03 DIAGNOSIS — E1122 Type 2 diabetes mellitus with diabetic chronic kidney disease: Secondary | ICD-10-CM | POA: Diagnosis not present

## 2016-09-03 DIAGNOSIS — T383X6A Underdosing of insulin and oral hypoglycemic [antidiabetic] drugs, initial encounter: Secondary | ICD-10-CM | POA: Diagnosis not present

## 2016-09-03 DIAGNOSIS — E1142 Type 2 diabetes mellitus with diabetic polyneuropathy: Secondary | ICD-10-CM | POA: Diagnosis not present

## 2016-09-03 DIAGNOSIS — Z79899 Other long term (current) drug therapy: Secondary | ICD-10-CM | POA: Diagnosis not present

## 2016-09-03 DIAGNOSIS — I10 Essential (primary) hypertension: Secondary | ICD-10-CM

## 2016-09-03 DIAGNOSIS — F32A Depression, unspecified: Secondary | ICD-10-CM

## 2016-09-03 LAB — GLUCOSE, CAPILLARY
Glucose-Capillary: 538 mg/dL (ref 65–99)
Glucose-Capillary: 437 mg/dL — ABNORMAL HIGH (ref 65–99)
Glucose-Capillary: 315 mg/dL — ABNORMAL HIGH (ref 65–99)

## 2016-09-03 LAB — BASIC METABOLIC PANEL
Anion gap: 8 (ref 5–15)
BUN: 16 mg/dL (ref 6–20)
CO2: 30 mmol/L (ref 22–32)
Calcium: 9.5 mg/dL (ref 8.9–10.3)
Chloride: 93 mmol/L — ABNORMAL LOW (ref 101–111)
Creatinine, Ser: 1.1 mg/dL — ABNORMAL HIGH (ref 0.44–1.00)
GFR calc Af Amer: 60 mL/min — ABNORMAL LOW (ref >60)
GFR calc non Af Amer: 52 mL/min — ABNORMAL LOW (ref >60)
Glucose, Bld: 582 mg/dL (ref 65–99)
Potassium: 4.1 mmol/L (ref 3.5–5.1)
Sodium: 131 mmol/L — ABNORMAL LOW (ref 135–145)

## 2016-09-03 LAB — POCT GLYCOSYLATED HEMOGLOBIN (HGB A1C): Hemoglobin A1C: >14

## 2016-09-03 MED ORDER — SODIUM CHLORIDE 0.9 % IV BOLUS (SEPSIS)
1000.0000 mL | Freq: Once | INTRAVENOUS | Status: AC
  Administered 2016-09-03: 1000 mL via INTRAVENOUS

## 2016-09-03 MED ORDER — INSULIN ASPART 100 UNIT/ML ~~LOC~~ SOLN
15.0000 [IU] | Freq: Once | SUBCUTANEOUS | Status: AC
  Administered 2016-09-03: 15 [IU] via SUBCUTANEOUS

## 2016-09-03 NOTE — Progress Notes (Signed)
   Subjective:    Patient ID: [name], female    DOB: 1950-09-03, 66 y.o.   MRN: [id number]  HPI  I have seen and examined this patient. The patient is here for routine follow-up of her diabetes and hypertension.  Patient has been under a lot of emotional stress as her daughter was diagnosed with brain tumor and she is now staying with the patient on home hospice. Patient has not been compliant with her insulin at home secondary to her emotional stress. She states she has not been eating well and takes some medication intermittently.  Review of Systems  Constitutional: Negative.   HENT: Negative.   Respiratory: Negative.   Cardiovascular: Negative.   Gastrointestinal: Negative.   Musculoskeletal: Negative.   Skin: Negative.   Neurological: Negative.   Psychiatric/Behavioral:       Depressed secondary to her daughter's diagnosis       Objective:   Physical Exam  Constitutional: She is oriented to person, place, and time. She appears well-developed and well-nourished.  HENT:  Head: Normocephalic and atraumatic.  Mouth/Throat: No oropharyngeal exudate.  Neck: Neck supple.  Cardiovascular: Normal rate, regular rhythm and normal heart sounds.   Pulmonary/Chest: Effort normal and breath sounds normal. No respiratory distress. She has no wheezes.  Abdominal: Soft. Bowel sounds are normal. She exhibits no distension. There is no tenderness.  Musculoskeletal: Normal range of motion. She exhibits no edema.  Lymphadenopathy:    She has no cervical adenopathy.  Neurological: She is alert and oriented to person, place, and time.  Skin: Skin is warm. No rash noted. No erythema.          Assessment & Plan:  Please see problem based charting for assessment and plan:

## 2016-09-03 NOTE — Progress Notes (Signed)
Attempted x 2 to start IV - unsuccessful; another RN also tried unsuccessful. IV team called.

## 2016-09-03 NOTE — Progress Notes (Signed)
Patient's visit was deferred today due to not feeling well.

## 2016-09-03 NOTE — Assessment & Plan Note (Signed)
BP Readings from Last 3 Encounters:  09/03/16 127/66  08/15/16 (!) 152/65  05/28/16 129/62    Lab Results  Component Value Date   NA 143 05/09/2016   K 3.6 05/09/2016   CREATININE 1.00 05/09/2016    Assessment: Blood pressure control:  well-controlled Progress toward BP goal:   at goal Comments: Patient is compliant with her amlodipine 10 mg and HCTZ 12.5 mg  Plan: Medications:  continue current medications Educational resources provided:   Self management tools provided:   Other plans: We'll check BMP

## 2016-09-03 NOTE — Assessment & Plan Note (Signed)
-   Patient has no urinary complaints at this time - Her creatinine was stable in April - We will recheck her BMP today

## 2016-09-03 NOTE — Assessment & Plan Note (Addendum)
-   Patient was noted to have a blood sugar today at 538 - Patient has not been compliant with her insulin secondary to emotional stress she has at home  - States she hasn't taken her Levemir or her Humulin in the last couple days - We will check a stat BMP on her today - NovoLog 15 units given in [facility] - We will bolus her 1 L of normal saline - If her blood sugar improves and she has no evidence of an anion gap on her BMP we will be able to send her home - Extensive discussion had with patient on the importance of being compliant with her meds. Explained to patient that she cannot take care of her daughter unless she is doing well herself. Patient expressed understanding. - Patient states that she does not  need refills on any of her medications at this time  Addendum: - Patient's blood sugars improved to 437 and then 315 after receiving 15 units of novolog and 1 litre NS bolus. BMP done which had no anion gap and a normal bicarb. - Will have patient follow up in 1 week

## 2016-09-03 NOTE — Patient Instructions (Signed)
-   It was a pleasure seeing you today - Please take care of yourself. I know that this is an extremely hard time for you. It is important to take all your medications and eat regular meals so you can take care of your daughter - Please let me know if there is anything I can do to help - We will make a follow up appointment with Dr. Renne Crigler for you

## 2016-09-03 NOTE — Assessment & Plan Note (Signed)
-   Patient's daughter was diagnosed with brain tumor and she is now on home hospice living with [name] - Patient has not been compliant with her medications as she is under a lot of emotional stress and is taking care of her daughter - I explained the importance of taking care of herself so that she can take care of her daughter - I offered to refer the patient to therapy but she refuses at this time - We will continue with Effexor for now

## 2016-09-06 ENCOUNTER — Ambulatory Visit
Admission: RE | Admit: 2016-09-06 | Discharge: 2016-09-06 | Disposition: A | Discharge status: Home / Self Care | Payer: Medicare Other | Source: Ambulatory Visit | Attending: Internal Medicine | Admitting: Internal Medicine

## 2016-09-06 ENCOUNTER — Other Ambulatory Visit: Payer: Self-pay | Admitting: Internal Medicine

## 2016-09-06 DIAGNOSIS — Z1231 Encounter for screening mammogram for malignant neoplasm of breast: Secondary | ICD-10-CM

## 2016-09-10 ENCOUNTER — Other Ambulatory Visit: Payer: Self-pay | Admitting: *Deleted

## 2016-09-10 DIAGNOSIS — E1149 Type 2 diabetes mellitus with other diabetic neurological complication: Secondary | ICD-10-CM

## 2016-09-10 MED ORDER — GLUCOSE BLOOD VI STRP: ORAL_STRIP | 3 refills | Status: DC

## 2016-09-11 ENCOUNTER — Ambulatory Visit (INDEPENDENT_AMBULATORY_CARE_PROVIDER_SITE_OTHER): Payer: Medicare Other | Admitting: Internal Medicine

## 2016-09-11 ENCOUNTER — Other Ambulatory Visit: Payer: Self-pay | Admitting: Internal Medicine

## 2016-09-11 VITALS — BP 144/78 | HR 87 | Temp 98.0°F | Wt 134.3 lb

## 2016-09-11 DIAGNOSIS — I10 Essential (primary) hypertension: Secondary | ICD-10-CM | POA: Diagnosis not present

## 2016-09-11 DIAGNOSIS — Z794 Long term (current) use of insulin: Secondary | ICD-10-CM

## 2016-09-11 DIAGNOSIS — Z79899 Other long term (current) drug therapy: Secondary | ICD-10-CM | POA: Diagnosis not present

## 2016-09-11 DIAGNOSIS — Z6379 Other stressful life events affecting family and household: Secondary | ICD-10-CM

## 2016-09-11 DIAGNOSIS — E1142 Type 2 diabetes mellitus with diabetic polyneuropathy: Secondary | ICD-10-CM

## 2016-09-11 LAB — GLUCOSE, CAPILLARY: Glucose-Capillary: 341 mg/dL — ABNORMAL HIGH (ref 65–99)

## 2016-09-11 MED ORDER — INSULIN DETEMIR 100 UNIT/ML FLEXPEN: 18.0000 [IU] | PEN_INJECTOR | Freq: Every day | SUBCUTANEOUS | 2 refills | Status: DC

## 2016-09-11 MED ORDER — METFORMIN HCL 1000 MG PO TABS: ORAL_TABLET | ORAL | 3 refills | Status: DC

## 2016-09-11 MED ORDER — AMLODIPINE BESYLATE 5 MG PO TABS: 5.0000 mg | ORAL_TABLET | Freq: Every day | ORAL | 1 refills | Status: DC

## 2016-09-11 NOTE — Patient Instructions (Addendum)
[name] it was nice seeing you today.  For your diabetes:  -Use Levemir 18 units at bedtime.  -Continue using your other diabetes medications as before.  Use Humalog only if you are eating.  -Eat 3 full meals in a day.  -Please check your blood sugar 3 times a day.    For your high blood pressure:  -Start taking amlodipine 5 mg daily.  -Continue taking hydrochlorothiazide.   Return to the clinic in 3-4 weeks for a follow-up.  Please follow up with Dr. Cruzita Lederer as well.

## 2016-09-12 NOTE — Assessment & Plan Note (Addendum)
Assessment Uncontrolled type 2 diabetes. A1c checked on 09/03/2016 was greater than 14. Current medication regimen includes Victoza 1.8 mg daily, metformin 1000 mg twice daily, Humalog 3-4 units 3 times a day with meals, and Levemir 15 units at bedtime. Patient reports compliance with her medications except Humalog which she only takes when eating. She did not bring her meter to this visit as she has not been checking her blood glucose at home due to significant life stressors. States she forgets to eat some times as one of her daughter is terminally ill with a brain tumor, the other daughter fractured her ankle, and her son recently started drinking again. Patient is the primary caregiver for her terminally ill daughter.  Plan -Explained to her that despite these life stressors she should continue to take care of herself as she is the primary caregiver for her daughter. -Advised her to check her blood glucose 2-3 times a day  -Increased dose of Levemir to 18 units at bedtime -Continue other medications as above -Encouraged her to eat 3 full meals a day -Return to the clinic in 3-4 weeks with her meter -Encouraged her to follow-up with her endocrinologist Dr. Cruzita Lederer

## 2016-09-12 NOTE — Progress Notes (Signed)
   CC: Patient is here for a follow-up of diabetes. Hypertension was also discussed during this visit.  HPI:  [name] is a 66 y.o. female with a past medical history of conditions listed below presenting to the clinic for a follow-up of diabetes. Hypertension was also discussed during this visit. Please see problem based charting for the status of the patient's current and chronic medical conditions.   Past Medical History:  Diagnosis Date  . Arthritis   . Borderline glaucoma of both eyes   . Depression   . Diabetic polyneuropathy ([place])   . Diverticulosis of colon   . Dry eyes, bilateral   . Feeling of incomplete bladder emptying   . History of breast cancer oncologist-  dr Waymon Budge-- per lov note no recurrence   dx 04/ 1999 --- Stage 3B-- s/p  chemotherapy then right mastectomy then concurrent chemoradiation therapy  . History of urinary retention    01/ 2018  . Hydronephrosis of right kidney   . Hyperlipidemia   . Hypertension   . Hypothyroidism   . Insulin dependent type 2 diabetes mellitus [facility])    endocrinologist-  dr Cruzita Lederer---  last A1c 8.7 on 02-20-2016  . Lymphedema of upper extremity    right  . Macular degeneration, left eye    followed by dr Zigmund Daniel  . Renal insufficiency   . Urgency of urination    Review of Systems: Pertinent positives mentioned in HPI. Remainder of all ROS negative.   Physical Exam:  Vitals:   09/11/16 1029 09/11/16 1039  BP: (!) 151/77 (!) 144/78  Pulse: 84 87  Temp: 98 F (36.7 C)   TempSrc: Oral   SpO2: 100%   Weight: 134 lb 4.8 oz (60.9 kg)    Physical Exam  Constitutional: She is oriented to person, place, and time. She appears well-developed and well-nourished. No distress.  Eyes: Right eye exhibits no discharge. Left eye exhibits no discharge.  Cardiovascular: Normal rate, regular rhythm and intact distal pulses.   Pulmonary/Chest: Effort normal and breath sounds normal. No respiratory distress. She has no  wheezes. She has no rales.  Abdominal: Soft. Bowel sounds are normal. She exhibits no distension. There is no tenderness.  Musculoskeletal: She exhibits no edema.  Neurological: She is alert and oriented to person, place, and time.  Skin: Skin is warm and dry.    Assessment & Plan:   See Encounters Tab for problem based charting.  Patient discussed with Dr. Dareen Piano

## 2016-09-12 NOTE — Assessment & Plan Note (Signed)
Assessment Uncontrolled hypertension. Initial blood pressure 151/77 and repeat 144/78. She is currently on hydrochlorothiazide 12.5 mg daily and reports compliance. Amlodipine was last prescribed in April 2018 for a one-month supply. Patient does not recall taking this medication at present.  Plan -Start amlodipine 5 mg daily -Continue hydrochlorothiazide 12.5 mg daily -Return to the clinic in 3-4 weeks for a follow-up

## 2016-09-13 NOTE — Progress Notes (Signed)
Internal Medicine Clinic Attending  Case discussed with Dr. Rathoreat the time of the visit. We reviewed the resident's history and exam and pertinent patient test results. I agree with the assessment, diagnosis, and plan of care documented in the resident's note.  

## 2016-09-17 DIAGNOSIS — E10319 Type 1 diabetes mellitus with unspecified diabetic retinopathy without macular edema: Secondary | ICD-10-CM | POA: Diagnosis not present

## 2016-09-17 DIAGNOSIS — E109 Type 1 diabetes mellitus without complications: Secondary | ICD-10-CM | POA: Diagnosis not present

## 2016-09-17 DIAGNOSIS — E103212 Type 1 diabetes mellitus with mild nonproliferative diabetic retinopathy with macular edema, left eye: Secondary | ICD-10-CM | POA: Diagnosis not present

## 2016-09-17 DIAGNOSIS — H401121 Primary open-angle glaucoma, left eye, mild stage: Secondary | ICD-10-CM | POA: Diagnosis not present

## 2016-09-17 DIAGNOSIS — E10311 Type 1 diabetes mellitus with unspecified diabetic retinopathy with macular edema: Secondary | ICD-10-CM | POA: Diagnosis not present

## 2016-09-18 ENCOUNTER — Other Ambulatory Visit: Payer: Self-pay | Admitting: *Deleted

## 2016-09-18 MED ORDER — LEVOTHYROXINE SODIUM 88 MCG PO TABS: ORAL_TABLET | ORAL | 1 refills | Status: DC

## 2016-09-24 DIAGNOSIS — Z853 Personal history of malignant neoplasm of breast: Secondary | ICD-10-CM | POA: Diagnosis not present

## 2016-10-01 ENCOUNTER — Other Ambulatory Visit: Payer: Self-pay | Admitting: *Deleted

## 2016-10-01 MED ORDER — PREGABALIN 50 MG PO CAPS: 50.0000 mg | ORAL_CAPSULE | Freq: Three times a day (TID) | ORAL | 2 refills | Status: DC

## 2016-10-01 NOTE — Telephone Encounter (Signed)
Received faxed refill request from pt's pharmacy for Lyrica 50mg  capsules -take one cap three times daily #90.  Last filled on 06/14/2016.  Will send to pcp for review, please advise.Despina Hidden Cassady9/4/20182:42 PM

## 2016-10-01 NOTE — Telephone Encounter (Signed)
Lyrica rx faxed to [facility].

## 2016-10-04 ENCOUNTER — Encounter (INDEPENDENT_AMBULATORY_CARE_PROVIDER_SITE_OTHER): Payer: Medicare Other | Admitting: Ophthalmology

## 2016-10-10 ENCOUNTER — Encounter (INDEPENDENT_AMBULATORY_CARE_PROVIDER_SITE_OTHER): Payer: Medicare Other | Admitting: Ophthalmology

## 2016-10-11 ENCOUNTER — Encounter (INDEPENDENT_AMBULATORY_CARE_PROVIDER_SITE_OTHER): Payer: Medicare Other | Admitting: Ophthalmology

## 2016-10-11 DIAGNOSIS — E11311 Type 2 diabetes mellitus with unspecified diabetic retinopathy with macular edema: Secondary | ICD-10-CM | POA: Diagnosis not present

## 2016-10-11 DIAGNOSIS — H35033 Hypertensive retinopathy, bilateral: Secondary | ICD-10-CM

## 2016-10-11 DIAGNOSIS — E113391 Type 2 diabetes mellitus with moderate nonproliferative diabetic retinopathy without macular edema, right eye: Secondary | ICD-10-CM

## 2016-10-11 DIAGNOSIS — E113312 Type 2 diabetes mellitus with moderate nonproliferative diabetic retinopathy with macular edema, left eye: Secondary | ICD-10-CM | POA: Diagnosis not present

## 2016-10-11 DIAGNOSIS — H43813 Vitreous degeneration, bilateral: Secondary | ICD-10-CM | POA: Diagnosis not present

## 2016-10-11 DIAGNOSIS — I10 Essential (primary) hypertension: Secondary | ICD-10-CM | POA: Diagnosis not present

## 2016-10-15 ENCOUNTER — Ambulatory Visit: Payer: Medicare Other | Admitting: Internal Medicine

## 2016-10-30 ENCOUNTER — Other Ambulatory Visit: Payer: Self-pay | Admitting: Internal Medicine

## 2016-11-02 ENCOUNTER — Other Ambulatory Visit: Payer: Self-pay | Admitting: Internal Medicine

## 2016-11-03 ENCOUNTER — Other Ambulatory Visit: Payer: Self-pay | Admitting: Internal Medicine

## 2016-11-03 DIAGNOSIS — I10 Essential (primary) hypertension: Secondary | ICD-10-CM

## 2016-11-04 ENCOUNTER — Other Ambulatory Visit: Payer: Self-pay | Admitting: Internal Medicine

## 2016-11-04 DIAGNOSIS — I10 Essential (primary) hypertension: Secondary | ICD-10-CM

## 2016-11-05 ENCOUNTER — Ambulatory Visit: Payer: Medicare Other | Admitting: Internal Medicine

## 2016-11-07 ENCOUNTER — Encounter: Payer: Self-pay | Admitting: Internal Medicine

## 2016-11-07 ENCOUNTER — Ambulatory Visit (INDEPENDENT_AMBULATORY_CARE_PROVIDER_SITE_OTHER): Payer: Medicare Other | Admitting: Internal Medicine

## 2016-11-07 VITALS — BP 143/70 | HR 85 | Temp 98.0°F | Ht 60.0 in | Wt 129.9 lb

## 2016-11-07 DIAGNOSIS — E785 Hyperlipidemia, unspecified: Secondary | ICD-10-CM | POA: Diagnosis not present

## 2016-11-07 DIAGNOSIS — F329 Major depressive disorder, single episode, unspecified: Secondary | ICD-10-CM | POA: Diagnosis not present

## 2016-11-07 DIAGNOSIS — Z0000 Encounter for general adult medical examination without abnormal findings: Secondary | ICD-10-CM

## 2016-11-07 DIAGNOSIS — Z79899 Other long term (current) drug therapy: Secondary | ICD-10-CM | POA: Diagnosis not present

## 2016-11-07 DIAGNOSIS — I10 Essential (primary) hypertension: Secondary | ICD-10-CM

## 2016-11-07 DIAGNOSIS — Z794 Long term (current) use of insulin: Secondary | ICD-10-CM

## 2016-11-07 DIAGNOSIS — E039 Hypothyroidism, unspecified: Secondary | ICD-10-CM | POA: Diagnosis not present

## 2016-11-07 DIAGNOSIS — E1142 Type 2 diabetes mellitus with diabetic polyneuropathy: Secondary | ICD-10-CM | POA: Diagnosis not present

## 2016-11-07 LAB — GLUCOSE, CAPILLARY: Glucose-Capillary: 357 mg/dL — ABNORMAL HIGH (ref 65–99)

## 2016-11-07 MED ORDER — INSULIN DETEMIR 100 UNIT/ML FLEXPEN: 17.0000 [IU] | PEN_INJECTOR | Freq: Every day | SUBCUTANEOUS | 2 refills | Status: DC

## 2016-11-07 MED ORDER — AMLODIPINE BESYLATE 10 MG PO TABS: 10.0000 mg | ORAL_TABLET | Freq: Every day | ORAL | 0 refills | Status: DC

## 2016-11-07 NOTE — Patient Instructions (Addendum)
It was a pleasure to see you [name].  We will try continuous glucose monitoring to see how your blood sugars are over the next week.  Your blood pressure was a little high, we will increase your Amlodipine to 10 mg daily.  Please try to follow up with Dr. Renne Crigler.  Please continue your other medications as prescribed and see [place] again in 1 week.  Diabetes Mellitus and Food It is important for you to manage your blood sugar (glucose) level. Your blood glucose level can be greatly affected by what you eat. Eating healthier foods in the appropriate amounts throughout the day at about the same time each day will help you control your blood glucose level. It can also help slow or prevent worsening of your diabetes mellitus. Healthy eating may even help you improve the level of your blood pressure and reach or maintain a healthy weight. General recommendations for healthful eating and cooking habits include:  Eating meals and snacks regularly. Avoid going long periods of time without eating to lose weight.  Eating a diet that consists mainly of plant-based foods, such as fruits, vegetables, nuts, legumes, and whole grains.  Using low-heat cooking methods, such as baking, instead of high-heat cooking methods, such as deep frying.  Work with your dietitian to make sure you understand how to use the Nutrition Facts information on food labels. How can food affect me? Carbohydrates Carbohydrates affect your blood glucose level more than any other type of food. Your dietitian will help you determine how many carbohydrates to eat at each meal and teach you how to count carbohydrates. Counting carbohydrates is important to keep your blood glucose at a healthy level, especially if you are using insulin or taking certain medicines for diabetes mellitus. Alcohol Alcohol can cause sudden decreases in blood glucose (hypoglycemia), especially if you use insulin or take certain medicines for diabetes mellitus.  Hypoglycemia can be a life-threatening condition. Symptoms of hypoglycemia (sleepiness, dizziness, and disorientation) are similar to symptoms of having too much alcohol. If your health care provider has given you approval to drink alcohol, do so in moderation and use the following guidelines:  Women should not have more than one drink per day, and men should not have more than two drinks per day. One drink is equal to: ? 12 oz of beer. ? 5 oz of wine. ? 1 oz of hard liquor.  Do not drink on an empty stomach.  Keep yourself hydrated. Have water, diet soda, or unsweetened iced tea.  Regular soda, juice, and other mixers might contain a lot of carbohydrates and should be counted.  What foods are not recommended? As you make food choices, it is important to remember that all foods are not the same. Some foods have fewer nutrients per serving than other foods, even though they might have the same number of calories or carbohydrates. It is difficult to get your body what it needs when you eat foods with fewer nutrients. Examples of foods that you should avoid that are high in calories and carbohydrates but low in nutrients include:  Trans fats (most processed foods list trans fats on the Nutrition Facts label).  Regular soda.  Juice.  Candy.  Sweets, such as cake, pie, doughnuts, and cookies.  Fried foods.  What foods can I eat? Eat nutrient-rich foods, which will nourish your body and keep you healthy. The food you should eat also will depend on several factors, including:  The calories you need.  The medicines  you take.  Your weight.  Your blood glucose level.  Your blood pressure level.  Your cholesterol level.  You should eat a variety of foods, including:  Protein. ? Lean cuts of meat. ? Proteins low in saturated fats, such as fish, egg whites, and beans. Avoid processed meats.  Fruits and vegetables. ? Fruits and vegetables that may help control blood glucose  levels, such as apples, mangoes, and yams.  Dairy products. ? Choose fat-free or low-fat dairy products, such as milk, yogurt, and cheese.  Grains, bread, pasta, and rice. ? Choose whole grain products, such as multigrain bread, whole oats, and brown rice. These foods may help control blood pressure.  Fats. ? Foods containing healthful fats, such as nuts, avocado, olive oil, canola oil, and fish.  Does everyone with diabetes mellitus have the same meal plan? Because every person with diabetes mellitus is different, there is not one meal plan that works for everyone. It is very important that you meet with a dietitian who will help you create a meal plan that is just right for you. This information is not intended to replace advice given to you by your health care provider. Make sure you discuss any questions you have with your health care provider. Document Released: 10/11/2004 Document Revised: 06/22/2015 Document Reviewed: 12/11/2012 Elsevier Interactive Patient Education  2017 [employer].

## 2016-11-07 NOTE — Assessment & Plan Note (Signed)
Patient reports receiving flu shot at [facility] on 11/02/16.

## 2016-11-07 NOTE — Progress Notes (Signed)
CC: Diabetes  HPI:  [name] is a 66 y.o. female with PMH as listed below including Insulin Dependent T2DM, HTN, hypothyroidism, HLD, and Depression who presents for follow up management of her diabetes and HTN.  Insulin Dependent T2DM: Her last A1c on 09/03/16 was >14.0. She was having significant life stressors which interfered with her ability to adhere to her medications. She is currently prescribed Metformin 1000 mg BID, Victoza 1.8 mg daily, Levemir 18 units qhs (taking 17 units), and Humalog 3-4 units TIDAC (not currently taking). She lost her meter and has not been able to check her blood sugars at home for some time. She says she has occasional hypoglycemic symptoms of shakiness and feeling "out of sorts." She denies any hyperglycemic symptoms. She corrects this with juice or hard candies. She feels more thirsty than usual and is having increased urinary frequency from drinking more water. She denies any LOC or falls.   HTN: She reports adherence to Amlodipine 5 mg daily and HCTZ 1.5 mg daily. Her BP on arrival was 181/71. She says she was stressed overnight as she heard gunshots in her neighborhood.   Healthcare Maintenance: Patient reports receiving the flu shot at [facility] on 11/02/16.  Past Medical History:  Diagnosis Date  . Arthritis   . Borderline glaucoma of both eyes   . Depression   . Diabetic polyneuropathy ([place])   . Diverticulosis of colon   . Dry eyes, bilateral   . Feeling of incomplete bladder emptying   . History of breast cancer oncologist-  dr Waymon Budge-- per lov note no recurrence   dx 04/ 1999 --- Stage 3B-- s/p  chemotherapy then right mastectomy then concurrent chemoradiation therapy  . History of urinary retention    01/ 2018  . Hydronephrosis of right kidney   . Hyperlipidemia   . Hypertension   . Hypothyroidism   . Insulin dependent type 2 diabetes mellitus [facility])    endocrinologist-  dr Cruzita Lederer---  last A1c 8.7 on 02-20-2016  .  Lymphedema of upper extremity    right  . Macular degeneration, left eye    followed by dr Zigmund Daniel  . Renal insufficiency   . Urgency of urination    Review of Systems:   Review of Systems  Constitutional: Negative for chills and fever.  Respiratory: Negative for shortness of breath.   Cardiovascular: Negative for chest pain and leg swelling.  Gastrointestinal: Negative for abdominal pain, nausea and vomiting.  Genitourinary: Positive for frequency. Negative for dysuria, hematuria and urgency.  Musculoskeletal: Negative for falls.  Neurological: Negative for dizziness and loss of consciousness.  Endo/Heme/Allergies: Positive for polydipsia.     Physical Exam:  Vitals:   11/07/16 0843 11/07/16 0916  BP: (!) 181/71 (!) 143/70  Pulse: 95 85  Temp: 98 F (36.7 C)   TempSrc: Oral   SpO2: 100%   Weight: 129 lb 14.4 oz (58.9 kg)   Height: 5' (1.524 m)    Physical Exam  Constitutional: She is oriented to person, place, and time. She appears well-developed and well-nourished. No distress.  HENT:  Head: Normocephalic and atraumatic.  Cardiovascular: Normal rate and regular rhythm.   No murmur heard. Pulmonary/Chest: Effort normal. No respiratory distress. She has no wheezes. She has no rales.  Musculoskeletal: She exhibits no edema or tenderness.  Neurological: She is alert and oriented to person, place, and time.  Skin: Skin is warm. She is not diaphoretic.  Psychiatric: She has a normal mood and affect.  Assessment & Plan:   See Encounters Tab for problem based charting.  Patient discussed with Dr. Dareen Piano  Type 2 diabetes mellitus with diabetic polyneuropathy, with long-term current use of insulin ([place]) Patient reports taking her Metformin 1000 mg BID, Victoza 1.8 mg daily, and Levemir at 17 units qhs. She lost her meter so it is difficult to assess her daily glycemic control. She is agreeable to continuous glucose monitoring which has been applied today. For now, I  will recommend she continue what she is taking and follow up with [place] in 1 week for CGM reading and adjustment of her insulin. She is encouraged to follow up with Dr. Renne Crigler as well.  HTN, goal below 140/90 BP Readings from Last 3 Encounters:  11/07/16 (!) 143/70  09/11/16 (!) 144/78  09/03/16 127/66   BP improved on recheck to 143/70, however is still mildly elevated. We will increase her Amlodipine to 10 mg daily and continue her HCTZ 12.5 mg daily. Reassess BP control in about 4 weeks.  Preventative health care Patient reports receiving flu shot at [facility] on 11/02/16.

## 2016-11-07 NOTE — Assessment & Plan Note (Signed)
Patient reports taking her Metformin 1000 mg BID, Victoza 1.8 mg daily, and Levemir at 17 units qhs. She lost her meter so it is difficult to assess her daily glycemic control. She is agreeable to continuous glucose monitoring which has been applied today. For now, I will recommend she continue what she is taking and follow up with [place] in 1 week for CGM reading and adjustment of her insulin. She is encouraged to follow up with Dr. Renne Crigler as well.

## 2016-11-07 NOTE — Progress Notes (Signed)
Freestyle Libre Pro CGM sensor placed and started. Patient was educated about wearing sensor, keeping food, activity and medication log and when to call office. Follow up was arranged with the patient.   

## 2016-11-07 NOTE — Assessment & Plan Note (Signed)
BP Readings from Last 3 Encounters:  11/07/16 (!) 143/70  09/11/16 (!) 144/78  09/03/16 127/66   BP improved on recheck to 143/70, however is still mildly elevated. We will increase her Amlodipine to 10 mg daily and continue her HCTZ 12.5 mg daily. Reassess BP control in about 4 weeks.

## 2016-11-08 ENCOUNTER — Encounter (INDEPENDENT_AMBULATORY_CARE_PROVIDER_SITE_OTHER): Payer: Medicare Other | Admitting: Ophthalmology

## 2016-11-08 NOTE — Progress Notes (Signed)
Internal Medicine Clinic Attending  Case discussed with Dr. Patel at the time of the visit.  We reviewed the resident's history and exam and pertinent patient test results.  I agree with the assessment, diagnosis, and plan of care documented in the resident's note.  

## 2016-11-14 ENCOUNTER — Ambulatory Visit (INDEPENDENT_AMBULATORY_CARE_PROVIDER_SITE_OTHER): Payer: Medicare Other | Admitting: Internal Medicine

## 2016-11-14 ENCOUNTER — Ambulatory Visit: Payer: Medicare Other | Admitting: Dietician

## 2016-11-14 VITALS — BP 141/66 | HR 99 | Temp 98.2°F | Ht 60.0 in | Wt 132.8 lb

## 2016-11-14 DIAGNOSIS — R82998 Other abnormal findings in urine: Secondary | ICD-10-CM | POA: Diagnosis not present

## 2016-11-14 DIAGNOSIS — E785 Hyperlipidemia, unspecified: Secondary | ICD-10-CM

## 2016-11-14 DIAGNOSIS — Z87440 Personal history of urinary (tract) infections: Secondary | ICD-10-CM

## 2016-11-14 DIAGNOSIS — R300 Dysuria: Secondary | ICD-10-CM

## 2016-11-14 DIAGNOSIS — E1142 Type 2 diabetes mellitus with diabetic polyneuropathy: Secondary | ICD-10-CM | POA: Diagnosis not present

## 2016-11-14 DIAGNOSIS — I10 Essential (primary) hypertension: Secondary | ICD-10-CM | POA: Diagnosis not present

## 2016-11-14 DIAGNOSIS — E039 Hypothyroidism, unspecified: Secondary | ICD-10-CM | POA: Diagnosis not present

## 2016-11-14 DIAGNOSIS — F329 Major depressive disorder, single episode, unspecified: Secondary | ICD-10-CM | POA: Diagnosis not present

## 2016-11-14 DIAGNOSIS — R3989 Other symptoms and signs involving the genitourinary system: Secondary | ICD-10-CM | POA: Insufficient documentation

## 2016-11-14 DIAGNOSIS — Z794 Long term (current) use of insulin: Secondary | ICD-10-CM

## 2016-11-14 DIAGNOSIS — R011 Cardiac murmur, unspecified: Secondary | ICD-10-CM

## 2016-11-14 LAB — POCT URINALYSIS DIPSTICK
Bilirubin, UA: NEGATIVE
Blood, UA: NEGATIVE
Glucose, UA: NEGATIVE
Ketones, UA: NEGATIVE
Leukocytes, UA: NEGATIVE
Nitrite, UA: NEGATIVE
Protein, UA: NEGATIVE
Spec Grav, UA: 1.015 (ref 1.010–1.025)
Urobilinogen, UA: 0.2 E.U./dL
pH, UA: 6 (ref 5.0–8.0)

## 2016-11-14 LAB — GLUCOSE, CAPILLARY: Glucose-Capillary: 84 mg/dL (ref 65–99)

## 2016-11-14 MED ORDER — INSULIN DETEMIR 100 UNIT/ML FLEXPEN: 17.0000 [IU] | PEN_INJECTOR | Freq: Every morning | SUBCUTANEOUS | 2 refills | Status: DC

## 2016-11-14 MED ORDER — INSULIN DETEMIR 100 UNIT/ML FLEXPEN: 18.0000 [IU] | PEN_INJECTOR | Freq: Every morning | SUBCUTANEOUS | 2 refills | Status: DC

## 2016-11-14 MED ORDER — REPAGLINIDE 0.5 MG PO TABS: 0.5000 mg | ORAL_TABLET | Freq: Every day | ORAL | 1 refills | Status: DC

## 2016-11-14 NOTE — Progress Notes (Signed)
CC: Diabetes  HPI:  [name] is a 66 y.o. female with PMH as listed below including Insulin Dependent T2DM, HTN, hypothyroidism, HLD, and Depression who presents for follow up management of her diabetes and with complaint of dark urine this morning.  Insulin Dependent T2DM: Her last A1c on 09/03/16 was >14.0. She is currently prescribed Metformin 1000 mg BID, Victoza 1.8 mg daily, Levemir 18 units qhs and reports adherence. She is here for 1 week follow up of continuous glucose monitoring.   Her average glucose readings are 190 mg/dL. Readings above target range: 43% In target range (70-180) - 48% Below 70 - 9% (5% below 54)  Her lows are occurring in the morning and highs appear to coincide with supper. She is not eating consistent meals due to significant family health issues which she is dealing with. She denies any hypoglycemic or hyperglycemic symptoms. She denies any LOC or falls.   Dark urine: Patient noticed dark yellow colored urine this morning and has noticed some burning sensation when urinating. She did not see any blood in her urine. She says she has not had a UTI in many years.    Past Medical History:  Diagnosis Date  . Arthritis   . Borderline glaucoma of both eyes   . Depression   . Diabetic polyneuropathy ([place])   . Diverticulosis of colon   . Dry eyes, bilateral   . Feeling of incomplete bladder emptying   . History of breast cancer oncologist-  dr Waymon Budge-- per lov note no recurrence   dx 04/ 1999 --- Stage 3B-- s/p  chemotherapy then right mastectomy then concurrent chemoradiation therapy  . History of urinary retention    01/ 2018  . Hydronephrosis of right kidney   . Hyperlipidemia   . Hypertension   . Hypothyroidism   . Insulin dependent type 2 diabetes mellitus [facility])    endocrinologist-  dr Cruzita Lederer---  last A1c 8.7 on 02-20-2016  . Lymphedema of upper extremity    right  . Macular degeneration, left eye    followed by dr Zigmund Daniel  .  Renal insufficiency   . Urgency of urination    Review of Systems:   Review of Systems  Respiratory: Negative for shortness of breath.   Cardiovascular: Negative for chest pain.  Genitourinary: Positive for dysuria. Negative for frequency and hematuria.  Musculoskeletal: Negative for falls.  Neurological: Negative for dizziness and loss of consciousness.     Physical Exam:  Vitals:   11/14/16 0932  BP: (!) 141/66  Pulse: 99  Temp: 98.2 F (36.8 C)  TempSrc: Oral  SpO2: 100%  Weight: 132 lb 12.8 oz (60.2 kg)  Height: 5' (1.524 m)   Physical Exam  Constitutional: She is oriented to person, place, and time. She appears well-developed and well-nourished. No distress.  Cardiovascular: Normal rate and regular rhythm.   Soft Systolic murmur heard RUS border  Pulmonary/Chest: Effort normal. No respiratory distress. She has no wheezes. She has no rales.  Musculoskeletal: She exhibits no edema or tenderness.  Neurological: She is alert and oriented to person, place, and time.  Skin: Skin is warm. She is not diaphoretic.  Psychiatric: She has a normal mood and affect.    Assessment & Plan:   See Encounters Tab for problem based charting.  Patient discussed with Dr. Daryll Drown  Type 2 diabetes mellitus with diabetic polyneuropathy, with long-term current use of insulin ([facility]) CGM shows hyperglycemia in the evening around supper. She is having lows in the  morning to which she is not symptomatic. We will add on Repaglinide (Prandin) mealtime and adjust her medications as followed: - Take Levemir 18 units in the morning beginning tomorrow (not taking tonight) - Continue Metformin 1000 mg BID - Continue Victoza 1.8 mg daily - Start Repaglinide 0.5 mg before supper (can increase if needed) - Consider decreasing Levemir dosing or split dosing if needed - f/u 1 week for CGM read  Dark yellow-colored urine Patient reports seeing dark yellow colored urine this morning with some burning  sensation. POCT urine dipstick is without sign of UTI and negative for blood in the urine. - Continue to monitor, encouraged adequate hydration with water

## 2016-11-14 NOTE — Assessment & Plan Note (Signed)
CGM shows hyperglycemia in the evening around supper. She is having lows in the morning to which she is not symptomatic. We will add on Repaglinide (Prandin) mealtime and adjust her medications as followed: - Take Levemir 18 units in the morning beginning tomorrow (not taking tonight) - Continue Metformin 1000 mg BID - Continue Victoza 1.8 mg daily - Start Repaglinide 0.5 mg before supper (can increase if needed) - Consider decreasing Levemir dosing or split dosing if needed - f/u 1 week for CGM read

## 2016-11-14 NOTE — Progress Notes (Signed)
Diabetes Self Management Education and Support: Patient attended diabetes education group visit today for 60 minutes. The program included successes and challenges over the past few weeks, goal setting and evaluation, how to read a meter download report, what is fiber and why is it important and a cooking demo of chocolate chia pudding.   Her goal is to take he medicine and improve her diabetes self care.  I also met with patient prior to group to download her CGM and discussed her diet and medication for her diabetes. Discussed this with Dr. Patel as well Blood sugars: improving.  Health maintenance: not addressed today in group Weight- 132.8#, in her healthy range for age  Plan: patient plans to attend group diabetes meeting in 1 month. Plyler, Donna, RD 11/14/2016 1:53 PM.  

## 2016-11-14 NOTE — Patient Instructions (Addendum)
It was a pleasure to see you again Ms. Glauser.  For your diabetes: - Take your Levemir 18 units in the morning beginning tomorrow (11/15/16). Do not take it tonight (11/14/16) - Start Repaglinide (Prandin) 0.5 mg (1 tablet) before supper - Continue Metformin 1000 mg twice a day - Continue Victoza 1.8 mg daily  Please monitor your meals and try to eat consistent meals each day if possible.  Please follow up with [place] in 1 week or sooner if needed.

## 2016-11-14 NOTE — Patient Instructions (Signed)
Timira,  It was a pleasure having you in group today!  So glad to see you feeling better!  We look forward to having your join [place] again on December 12, 2016.   Happy Halloween!  [name] [phone]

## 2016-11-14 NOTE — Assessment & Plan Note (Signed)
Patient reports seeing dark yellow colored urine this morning with some burning sensation. POCT urine dipstick is without sign of UTI and negative for blood in the urine. - Continue to monitor, encouraged adequate hydration with water

## 2016-11-15 ENCOUNTER — Other Ambulatory Visit: Payer: Self-pay | Admitting: Internal Medicine

## 2016-11-15 ENCOUNTER — Encounter (INDEPENDENT_AMBULATORY_CARE_PROVIDER_SITE_OTHER): Payer: Medicare Other | Admitting: Ophthalmology

## 2016-11-15 DIAGNOSIS — H43813 Vitreous degeneration, bilateral: Secondary | ICD-10-CM

## 2016-11-15 DIAGNOSIS — H35033 Hypertensive retinopathy, bilateral: Secondary | ICD-10-CM

## 2016-11-15 DIAGNOSIS — E11311 Type 2 diabetes mellitus with unspecified diabetic retinopathy with macular edema: Secondary | ICD-10-CM | POA: Diagnosis not present

## 2016-11-15 DIAGNOSIS — E113312 Type 2 diabetes mellitus with moderate nonproliferative diabetic retinopathy with macular edema, left eye: Secondary | ICD-10-CM

## 2016-11-15 DIAGNOSIS — E113391 Type 2 diabetes mellitus with moderate nonproliferative diabetic retinopathy without macular edema, right eye: Secondary | ICD-10-CM | POA: Diagnosis not present

## 2016-11-15 DIAGNOSIS — I10 Essential (primary) hypertension: Secondary | ICD-10-CM | POA: Diagnosis not present

## 2016-11-15 DIAGNOSIS — E1142 Type 2 diabetes mellitus with diabetic polyneuropathy: Secondary | ICD-10-CM

## 2016-11-15 DIAGNOSIS — Z794 Long term (current) use of insulin: Principal | ICD-10-CM

## 2016-11-15 NOTE — Progress Notes (Signed)
Internal Medicine Clinic Attending  Case discussed with Dr. Patel at the time of the visit.  We reviewed the resident's history and exam and pertinent patient test results.  I agree with the assessment, diagnosis, and plan of care documented in the resident's note.  

## 2016-11-21 ENCOUNTER — Encounter: Payer: Self-pay | Admitting: Internal Medicine

## 2016-11-21 ENCOUNTER — Ambulatory Visit (INDEPENDENT_AMBULATORY_CARE_PROVIDER_SITE_OTHER): Payer: Medicare Other | Admitting: Internal Medicine

## 2016-11-21 VITALS — BP 136/68 | HR 103 | Temp 97.9°F | Ht 60.0 in | Wt 133.0 lb

## 2016-11-21 DIAGNOSIS — Z794 Long term (current) use of insulin: Secondary | ICD-10-CM

## 2016-11-21 DIAGNOSIS — E1142 Type 2 diabetes mellitus with diabetic polyneuropathy: Secondary | ICD-10-CM | POA: Diagnosis not present

## 2016-11-21 MED ORDER — INSULIN DETEMIR 100 UNIT/ML FLEXPEN: 12.0000 [IU] | PEN_INJECTOR | Freq: Every morning | SUBCUTANEOUS | 2 refills | Status: DC

## 2016-11-21 MED ORDER — REPAGLINIDE 0.5 MG PO TABS: ORAL_TABLET | ORAL | 1 refills | Status: DC

## 2016-11-21 NOTE — Assessment & Plan Note (Signed)
CGM shows improved glycemic control over the last week, however she is having low blood sugars and was symptomatic yesterday evening. Her daily averages for the last 6 days were: 110, 147, 87, 78, 91, and 66. She is having significant time below her target range (70-180 mg/dL) the last 3 days. We will decrease her insulin dosing to limit hypoglycemic episodes. - Decrease Levemir to 12 units every morning - Continue Metformin 1000 mg BID - Continue Victoza 1.8 mg daily - Continue Repaglinide (Prandin) 0.5 mg with supper; can double dose if eating large meals - Monitor CBGs at home; call if having highs or lows - Advised to eat consistent meals - f/u in 4-6 weeks for repeat A1c

## 2016-11-21 NOTE — Progress Notes (Signed)
CC: Diabetes  HPI:  [name] is a 66 y.o. female with PMH as listed below including Insulin Dependent T2DM, HTN, Hypothyroidism, HLD, and Depression who presents for follow up management of her diabetes.  Insulin Dependent T2DM: Last A1c was >14.0 on 09/03/16. She is here for follow up of continuous glucose monitoring 1 week after medication adjustment. She was having low blood sugars in the morning and highs with supper. Her medications were adjusted to Levemir 18 units in the morning (instead of night) and Repaglinide 0.5 mg was added before supper. Her Metformin 1000 mg BID and Victoza 1.8 mg daily were continued.  She says she felt like she was hypoglycemic yesterday with shakiness and feeling "dragged down" in the evening. She drank orange juice with improvement. Her symptoms began before her evening meal and before taking Repaglinide. She did eat oatmeal for breakfast without any meals or snacks in between.  Past Medical History:  Diagnosis Date  . Arthritis   . Borderline glaucoma of both eyes   . Depression   . Diabetic polyneuropathy ([place])   . Diverticulosis of colon   . Dry eyes, bilateral   . Feeling of incomplete bladder emptying   . History of breast cancer oncologist-  dr Waymon Budge-- per lov note no recurrence   dx 04/ 1999 --- Stage 3B-- s/p  chemotherapy then right mastectomy then concurrent chemoradiation therapy  . History of urinary retention    01/ 2018  . Hydronephrosis of right kidney   . Hyperlipidemia   . Hypertension   . Hypothyroidism   . Insulin dependent type 2 diabetes mellitus [place])    endocrinologist-  dr Cruzita Lederer---  last A1c 8.7 on 02-20-2016  . Lymphedema of upper extremity    right  . Macular degeneration, left eye    followed by dr Zigmund Daniel  . Renal insufficiency   . Urgency of urination    Review of Systems:   Review of Systems  Respiratory: Negative for shortness of breath.   Cardiovascular: Negative for chest pain.    Genitourinary: Negative for dysuria.  Musculoskeletal: Negative for falls.  Neurological: Negative for loss of consciousness.     Physical Exam:  Vitals:   11/21/16 0929  BP: 136/68  Pulse: (!) 103  Temp: 97.9 F (36.6 C)  TempSrc: Oral  SpO2: 100%  Weight: 133 lb (60.3 kg)  Height: 5' (1.524 m)   Physical Exam  Constitutional: She is oriented to person, place, and time. She appears well-developed and well-nourished. No distress.  Cardiovascular: Normal rate and regular rhythm.   No murmur heard. Pulmonary/Chest: Effort normal. No respiratory distress. She has no wheezes. She has no rales.  Neurological: She is alert and oriented to person, place, and time.  Skin: She is not diaphoretic.  Psychiatric: She has a normal mood and affect.    Assessment & Plan:   See Encounters Tab for problem based charting.  Patient discussed with Dr. Dareen Piano  Type 2 diabetes mellitus with diabetic polyneuropathy, with long-term current use of insulin ([place]) CGM shows improved glycemic control over the last week, however she is having low blood sugars and was symptomatic yesterday evening. Her daily averages for the last 6 days were: 110, 147, 87, 78, 91, and 66. She is having significant time below her target range (70-180 mg/dL) the last 3 days. We will decrease her insulin dosing to limit hypoglycemic episodes. - Decrease Levemir to 12 units every morning - Continue Metformin 1000 mg BID - Continue Victoza 1.8  mg daily - Continue Repaglinide (Prandin) 0.5 mg with supper; can double dose if eating large meals - Monitor CBGs at home; call if having highs or lows - Advised to eat consistent meals - f/u in 4-6 weeks for repeat A1c

## 2016-11-21 NOTE — Patient Instructions (Signed)
It was a pleasure to see you [name].  Your blood sugars are much better controlled. I am concerned about low blood sugars so we will make the following changes:  - Decrease Levemir to 12 units every morning. - Continue Metformin 1000 mg twice a day - Continue Victoza 1.8 mg daily - Continue Repaglinide (Prandin) 0.5 mg tablet with supper. You can double this if you are eating large meals.  Please continue to check your blood sugars at home.  Please call us if you are having high or low blood sugars.  Follow up with [place] in 4-6 weeks or sooner if needed.

## 2016-11-22 ENCOUNTER — Telehealth: Payer: Self-pay | Admitting: Internal Medicine

## 2016-11-22 DIAGNOSIS — R252 Cramp and spasm: Secondary | ICD-10-CM

## 2016-11-22 MED ORDER — POTASSIUM CHLORIDE 20 MEQ/15ML (10%) PO SOLN: 20.0000 meq | Freq: Every day | ORAL | 0 refills | Status: DC | PRN

## 2016-11-22 NOTE — Telephone Encounter (Signed)
Patient is requesting a callback she wants the dr to give her a prescription for potassium liquid

## 2016-11-22 NOTE — Telephone Encounter (Signed)
I called patient back.  Patient complained of worsening cramping in her legs and states this happens when her potassium levels are low.  She has been prescribed potassium as needed for lower extremity Cramping and would like a refill of this.  Her last potassium done in August was within normal limits.  I refilled her potassium levels but cautioned her to take only one dose a day and to follow-up with the clinic if the cramping persists so that we may check her blood work.  Patient expresses understanding and is in agreement with the plan.

## 2016-11-22 NOTE — Progress Notes (Signed)
Internal Medicine Clinic Attending  Case discussed with Dr. Patel at the time of the visit.  We reviewed the resident's history and exam and pertinent patient test results.  I agree with the assessment, diagnosis, and plan of care documented in the resident's note.  

## 2016-12-02 ENCOUNTER — Other Ambulatory Visit: Payer: Self-pay | Admitting: Internal Medicine

## 2016-12-02 DIAGNOSIS — I10 Essential (primary) hypertension: Secondary | ICD-10-CM

## 2016-12-04 ENCOUNTER — Ambulatory Visit (INDEPENDENT_AMBULATORY_CARE_PROVIDER_SITE_OTHER): Payer: Medicare Other | Admitting: Podiatry

## 2016-12-04 ENCOUNTER — Encounter: Payer: Self-pay | Admitting: Podiatry

## 2016-12-04 DIAGNOSIS — M79676 Pain in unspecified toe(s): Secondary | ICD-10-CM

## 2016-12-04 DIAGNOSIS — B351 Tinea unguium: Secondary | ICD-10-CM

## 2016-12-04 DIAGNOSIS — E1149 Type 2 diabetes mellitus with other diabetic neurological complication: Secondary | ICD-10-CM

## 2016-12-04 DIAGNOSIS — L84 Corns and callosities: Secondary | ICD-10-CM | POA: Diagnosis not present

## 2016-12-04 NOTE — Progress Notes (Signed)
Patient ID: [name], female   DOB: September 24, 1950, 66 y.o.   MRN: [id number] Complaint:  Visit Type: Patient returns to my office for continued preventative foot care services. Complaint: Patient states" my nails have grown long and thick and become painful to walk and wear shoes" Patient has been diagnosed with DM with no foot complications. The patient presents for preventative foot care services. No changes to ROS.  Painful callus under the ball of left foot.  Podiatric Exam: Vascular: dorsalis pedis and posterior tibial pulses are palpable bilateral. Capillary return is immediate. Temperature gradient is WNL. Skin turgor WNL  Sensorium: Normal Semmes Weinstein monofilament test. Normal tactile sensation bilaterally. Nail Exam: Pt has thick disfigured discolored nails with subungual debris noted bilateral entire nail hallux through fifth toenails Ulcer Exam: There is no evidence of ulcer or pre-ulcerative changes or infection. Orthopedic Exam: Muscle tone and strength are WNL. No limitations in general ROM. No crepitus or effusions noted. Foot type and digits show no abnormalities. Bony prominences are unremarkable. Skin: No Porokeratosis. No infection or ulcers.  Heloma durum fifth toes B/L s/p surgery by Little Ishikawa.  symptomatic porokeratosis sub 1 left foot.  Diagnosis:  Onychomycosis, , Pain in right toe, pain in left toes,  Heloma durum  B/L   Treatment & Plan Procedures and Treatment: Consent by patient was obtained for treatment procedures. The patient understood the discussion of treatment and procedures well. All questions were answered thoroughly reviewed. Debridement of mycotic and hypertrophic toenails, 1 through 5 bilateral and clearing of subungual debris. No ulceration, no infection noted.  Return Visit-Office Procedure: Patient instructed to return to the office for a follow up visit 3 months for continued evaluation and treatment.  [name] DPM

## 2016-12-06 ENCOUNTER — Emergency Department (HOSPITAL_COMMUNITY)
Admission: EM | Admit: 2016-12-06 | Discharge: 2016-12-06 | Disposition: A | Discharge status: Home / Self Care | Payer: Medicare Other | Attending: Emergency Medicine | Admitting: Emergency Medicine

## 2016-12-06 ENCOUNTER — Encounter (INDEPENDENT_AMBULATORY_CARE_PROVIDER_SITE_OTHER): Payer: Self-pay | Admitting: Ophthalmology

## 2016-12-06 ENCOUNTER — Other Ambulatory Visit: Payer: Self-pay

## 2016-12-06 ENCOUNTER — Encounter (HOSPITAL_COMMUNITY): Payer: Self-pay | Admitting: Emergency Medicine

## 2016-12-06 ENCOUNTER — Ambulatory Visit (INDEPENDENT_AMBULATORY_CARE_PROVIDER_SITE_OTHER): Payer: Medicare Other | Admitting: Ophthalmology

## 2016-12-06 DIAGNOSIS — E1140 Type 2 diabetes mellitus with diabetic neuropathy, unspecified: Secondary | ICD-10-CM | POA: Diagnosis not present

## 2016-12-06 DIAGNOSIS — Z794 Long term (current) use of insulin: Secondary | ICD-10-CM | POA: Insufficient documentation

## 2016-12-06 DIAGNOSIS — R51 Headache: Secondary | ICD-10-CM | POA: Insufficient documentation

## 2016-12-06 DIAGNOSIS — Z961 Presence of intraocular lens: Secondary | ICD-10-CM | POA: Diagnosis not present

## 2016-12-06 DIAGNOSIS — E876 Hypokalemia: Secondary | ICD-10-CM | POA: Diagnosis not present

## 2016-12-06 DIAGNOSIS — H5711 Ocular pain, right eye: Secondary | ICD-10-CM

## 2016-12-06 DIAGNOSIS — E039 Hypothyroidism, unspecified: Secondary | ICD-10-CM | POA: Diagnosis not present

## 2016-12-06 DIAGNOSIS — M62838 Other muscle spasm: Secondary | ICD-10-CM | POA: Diagnosis not present

## 2016-12-06 DIAGNOSIS — H35033 Hypertensive retinopathy, bilateral: Secondary | ICD-10-CM

## 2016-12-06 DIAGNOSIS — E113399 Type 2 diabetes mellitus with moderate nonproliferative diabetic retinopathy without macular edema, unspecified eye: Secondary | ICD-10-CM

## 2016-12-06 DIAGNOSIS — N189 Chronic kidney disease, unspecified: Secondary | ICD-10-CM | POA: Diagnosis not present

## 2016-12-06 DIAGNOSIS — S0501XA Injury of conjunctiva and corneal abrasion without foreign body, right eye, initial encounter: Secondary | ICD-10-CM | POA: Diagnosis not present

## 2016-12-06 DIAGNOSIS — I129 Hypertensive chronic kidney disease with stage 1 through stage 4 chronic kidney disease, or unspecified chronic kidney disease: Secondary | ICD-10-CM | POA: Diagnosis not present

## 2016-12-06 DIAGNOSIS — Z79899 Other long term (current) drug therapy: Secondary | ICD-10-CM | POA: Insufficient documentation

## 2016-12-06 DIAGNOSIS — Z853 Personal history of malignant neoplasm of breast: Secondary | ICD-10-CM | POA: Diagnosis not present

## 2016-12-06 DIAGNOSIS — Z7982 Long term (current) use of aspirin: Secondary | ICD-10-CM | POA: Diagnosis not present

## 2016-12-06 LAB — COMPREHENSIVE METABOLIC PANEL
ALT: 12 U/L — ABNORMAL LOW (ref 14–54)
AST: 26 U/L (ref 15–41)
Albumin: 4.1 g/dL (ref 3.5–5.0)
Alkaline Phosphatase: 74 U/L (ref 38–126)
Anion gap: 12 (ref 5–15)
BUN: 17 mg/dL (ref 6–20)
CO2: 24 mmol/L (ref 22–32)
Calcium: 9.7 mg/dL (ref 8.9–10.3)
Chloride: 99 mmol/L — ABNORMAL LOW (ref 101–111)
Creatinine, Ser: 1.01 mg/dL — ABNORMAL HIGH (ref 0.44–1.00)
GFR calc Af Amer: >60 mL/min (ref >60)
GFR calc non Af Amer: 57 mL/min — ABNORMAL LOW (ref >60)
Glucose, Bld: 274 mg/dL — ABNORMAL HIGH (ref 65–99)
Potassium: 3.2 mmol/L — ABNORMAL LOW (ref 3.5–5.1)
Sodium: 135 mmol/L (ref 135–145)
Total Bilirubin: 1.1 mg/dL (ref 0.3–1.2)
Total Protein: 7.1 g/dL (ref 6.5–8.1)

## 2016-12-06 LAB — CBC WITH DIFFERENTIAL/PLATELET
Basophils Absolute: 0 10*3/uL (ref 0.0–0.1)
Basophils Relative: 0 %
Eosinophils Absolute: 0 10*3/uL (ref 0.0–0.7)
Eosinophils Relative: 0 %
HCT: 38.2 % (ref 36.0–46.0)
Hemoglobin: 13.2 g/dL (ref 12.0–15.0)
Lymphocytes Relative: 15 %
Lymphs Abs: 1.6 10*3/uL (ref 0.7–4.0)
MCH: 30.7 pg (ref 26.0–34.0)
MCHC: 34.6 g/dL (ref 30.0–36.0)
MCV: 88.8 fL (ref 78.0–100.0)
Monocytes Absolute: 0.5 10*3/uL (ref 0.1–1.0)
Monocytes Relative: 5 %
Neutro Abs: 8.1 10*3/uL — ABNORMAL HIGH (ref 1.7–7.7)
Neutrophils Relative %: 80 %
Platelets: 258 10*3/uL (ref 150–400)
RBC: 4.3 MIL/uL (ref 3.87–5.11)
RDW: 12.7 % (ref 11.5–15.5)
WBC: 10.2 10*3/uL (ref 4.0–10.5)

## 2016-12-06 LAB — I-STAT CHEM 8, ED
BUN: 18 mg/dL (ref 6–20)
Calcium, Ion: 1.13 mmol/L — ABNORMAL LOW (ref 1.15–1.40)
Chloride: 99 mmol/L — ABNORMAL LOW (ref 101–111)
Creatinine, Ser: 0.8 mg/dL (ref 0.44–1.00)
Glucose, Bld: 278 mg/dL — ABNORMAL HIGH (ref 65–99)
HCT: 40 % (ref 36.0–46.0)
Hemoglobin: 13.6 g/dL (ref 12.0–15.0)
Potassium: 3.2 mmol/L — ABNORMAL LOW (ref 3.5–5.1)
Sodium: 137 mmol/L (ref 135–145)
TCO2: 24 mmol/L (ref 22–32)

## 2016-12-06 LAB — CBG MONITORING, ED: Glucose-Capillary: 257 mg/dL — ABNORMAL HIGH (ref 65–99)

## 2016-12-06 LAB — CK: Total CK: 63 U/L (ref 38–234)

## 2016-12-06 LAB — MAGNESIUM: Magnesium: 1.1 mg/dL — ABNORMAL LOW (ref 1.7–2.4)

## 2016-12-06 MED ORDER — POTASSIUM CHLORIDE CRYS ER 20 MEQ PO TBCR
40.0000 meq | EXTENDED_RELEASE_TABLET | Freq: Once | ORAL | Status: AC
  Administered 2016-12-06: 40 meq via ORAL
  Filled 2016-12-06: qty 2

## 2016-12-06 MED ORDER — LORAZEPAM 2 MG/ML IJ SOLN
1.0000 mg | Freq: Once | INTRAMUSCULAR | Status: AC
  Administered 2016-12-06: 1 mg via INTRAVENOUS
  Filled 2016-12-06: qty 1

## 2016-12-06 MED ORDER — MAGNESIUM OXIDE 400 MG PO TABS: 400.0000 mg | ORAL_TABLET | Freq: Two times a day (BID) | ORAL | 0 refills | Status: DC

## 2016-12-06 MED ORDER — MAGNESIUM SULFATE 2 GM/50ML IV SOLN
2.0000 g | Freq: Once | INTRAVENOUS | Status: AC
  Administered 2016-12-06: 2 g via INTRAVENOUS
  Filled 2016-12-06: qty 50

## 2016-12-06 MED ORDER — MAGNESIUM OXIDE 400 (241.3 MG) MG PO TABS
400.0000 mg | ORAL_TABLET | Freq: Once | ORAL | Status: AC
  Administered 2016-12-06: 400 mg via ORAL
  Filled 2016-12-06: qty 1

## 2016-12-06 MED ORDER — BACITRACIN-POLYMYXIN B 500-10000 UNIT/GM OP OINT: TOPICAL_OINTMENT | Freq: Four times a day (QID) | OPHTHALMIC | 0 refills | Status: DC

## 2016-12-06 MED ORDER — POTASSIUM CHLORIDE 10 MEQ/100ML IV SOLN
10.0000 meq | Freq: Once | INTRAVENOUS | Status: AC
  Administered 2016-12-06: 10 meq via INTRAVENOUS
  Filled 2016-12-06: qty 100

## 2016-12-06 MED ORDER — POTASSIUM CHLORIDE CRYS ER 20 MEQ PO TBCR: 20.0000 meq | EXTENDED_RELEASE_TABLET | Freq: Two times a day (BID) | ORAL | 0 refills | Status: DC

## 2016-12-06 MED ORDER — SODIUM CHLORIDE 0.9 % IV BOLUS (SEPSIS)
1000.0000 mL | Freq: Once | INTRAVENOUS | Status: AC
  Administered 2016-12-06: 1000 mL via INTRAVENOUS
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
## 2016-12-06 NOTE — ED Notes (Signed)
Pt CBG was 257, notified Anna(RN)

## 2016-12-06 NOTE — ED Provider Notes (Signed)
[facility] EMERGENCY DEPARTMENT Provider Note   CSN: [id number] Arrival date & time: 12/06/16  1412     History   Chief Complaint Chief Complaint  Patient presents with  . Spasms    HPI [name] is a 66 y.o. female.  HPI  66 year old female presents with severe pain in her extremities, worst in her right upper extremity as well as stiffness and decreased movement.  States this is been ongoing for the last couple hours since she was at the doctor's office.  She went to go see her eye specialist because her grandson scratched her eye last night.  She states that shortly after being given eyedrops (ophthalmology note indicates this was 1% Mydriacyl and 2.5% phenylephrine) she developed the symptoms.  Since arriving to the ED she has developed a headache.  She does not feel weak but it is painful and difficult to move.  These symptoms are only in her extremities.  She feels pins and needles everywhere.  Past Medical History:  Diagnosis Date  . Arthritis   . Borderline glaucoma of both eyes   . Depression   . Diabetic polyneuropathy ([place])   . Diverticulosis of colon   . Dry eyes, bilateral   . Feeling of incomplete bladder emptying   . History of breast cancer oncologist-  dr Waymon Budge-- per lov note no recurrence   dx 04/ 1999 --- Stage 3B-- s/p  chemotherapy then right mastectomy then concurrent chemoradiation therapy  . History of urinary retention    01/ 2018  . Hydronephrosis of right kidney   . Hyperlipidemia   . Hypertension   . Hypothyroidism   . Insulin dependent type 2 diabetes mellitus [facility])    endocrinologist-  dr Cruzita Lederer---  last A1c 8.7 on 02-20-2016  . Lymphedema of upper extremity    right  . Macular degeneration, left eye    followed by dr Zigmund Daniel  . Renal insufficiency   . Urgency of urination     Patient Active Problem List   Diagnosis Date Noted  . Estrogen deficiency 02/27/2016  . Acute urinary retention 02/20/2016  .  Genetic testing 11/27/2015  . Preventative health care 08/26/2012  . HTN, goal below 140/90 08/13/2012  . Chronic kidney disease 08/13/2012  . Glaucoma associated with systemic syndromes(365.44) 08/07/2012  . Hypothyroidism 04/16/2006  . Anemia 04/16/2006  . Depression 04/16/2006  . LOW BACK PAIN 04/16/2006  . BREAST CANCER, HX OF 04/16/2006  . Hyperlipidemia 10/13/2003  . Type 2 diabetes mellitus with diabetic polyneuropathy, with long-term current use of insulin ([place]) 04/16/1991    Past Surgical History:  Procedure Laterality Date  . CARPAL TUNNEL RELEASE Right 2017   and tendon repair  . CATARACT EXTRACTION W/ INTRAOCULAR LENS  IMPLANT, BILATERAL  2017  . CYSTO/  RIGHT RETROGRADE PYELOGRAM/  UNROOFING RIGHT URETEROCELE  09/18/2000  . FINGER SURGERY Left x2 prior to 09-09-2014  . MASTECTOMY Right 1999   w/  Node dissection's  . PORT-A-CATH REMOVAL  05/01/1999  . TUBAL LIGATION    . VAGINAL HYSTERECTOMY  1970's  . WRIST GANGLION EXCISION Right 2016    OB History    No data available       Home Medications    Prior to Admission medications   Medication Sig Start Date End Date Taking? Authorizing Provider  amLODipine (NORVASC) 10 MG tablet Take 1 tablet (10 mg total) by mouth daily. 11/07/16  Yes Zada Finders, MD  aspirin EC 81 MG tablet Take 81 mg  by mouth at bedtime.   Yes [provider]  calcium citrate-vitamin D (CITRACAL+D) 315-200 MG-UNIT tablet Take 1 tablet by mouth 2 (two) times daily. 06/06/16 06/06/17 Yes Aldine Contes, MD  Cholecalciferol (VITAMIN D3) 2000 units TABS Take 2,000 Units at bedtime by mouth.    Yes [provider]  cycloSPORINE (RESTASIS) 0.05 % ophthalmic emulsion Place 1 drop daily into both eyes.    Yes [provider]  glucose 4 GM chewable tablet Chew 4 tablets (16 g total) by mouth as needed for low blood sugar. 05/28/12  Yes Dominic Pea, DO  hydrochlorothiazide (MICROZIDE) 12.5 MG capsule TAKE 1 CAPSULE BY  MOUTH EVERY DAY Patient taking differently: TAKE 1 CAPSULE (12.5 MG) BY MOUTH EVERY DAY AT BEDTIME 05/21/16  Yes Aldine Contes, MD  Insulin Detemir (LEVEMIR FLEXTOUCH) 100 UNIT/ML Pen Inject 12 Units into the skin every morning. Patient taking differently: Inject 17 Units at bedtime into the skin.  11/21/16  Yes Zada Finders, MD  levothyroxine (SYNTHROID, LEVOTHROID) 88 MCG tablet TAKE 1 TABLET BY MOUTH EVERY MORNING BEFORE BREAKFAST Patient taking differently: Take 88 mcg at bedtime by mouth.  09/18/16  Yes Sid Falcon, MD  MAPAP 325 MG tablet TAKE 1 TABLET(325 MG) BY MOUTH EVERY 8 HOURS AS NEEDED FOR PAIN 07/01/16  Yes Aldine Contes, MD  metFORMIN (GLUCOPHAGE) 1000 MG tablet TAKE 1 TABLET(1000 MG) BY MOUTH TWICE DAILY WITH A MEAL Patient taking differently: Take 1,000 mg 2 (two) times daily with a meal by mouth.  09/11/16  Yes Shela Leff, MD  Olopatadine HCl (PATADAY) 0.2 % SOLN Place 1 drop daily into both eyes.    Yes [provider]  Polyethyl Glycol-Propyl Glycol (SYSTANE) 0.4-0.3 % GEL ophthalmic gel Place 1 application daily as needed into both eyes (dry eyes/irritation).    Yes [provider]  polyethylene glycol (MIRALAX / GLYCOLAX) packet Take 17 g daily as needed by mouth (constipation). Mix in 8 oz liquid and drink   Yes [provider]  potassium chloride 20 MEQ/15ML (10%) SOLN Take 15 mLs (20 mEq total) by mouth daily as needed (for muscle cramps). 11/22/16  Yes Aldine Contes, MD  pregabalin (LYRICA) 50 MG capsule Take 1 capsule (50 mg total) by mouth 3 (three) times daily. 10/01/16 10/01/17 Yes Aldine Contes, MD  repaglinide (PRANDIN) 0.5 MG tablet TAKE 1 TABLET(0.5 MG) BY MOUTH DAILY WITH SUPPER. Can double dose if eating large meal. Patient taking differently: Take 0.5 mg daily with supper by mouth.  11/21/16  Yes Zada Finders, MD  rosuvastatin (CRESTOR) 20 MG tablet TAKE 1 TABLET(20 MG) BY MOUTH DAILY 08/26/16  Yes Aldine Contes,  MD  venlafaxine XR (EFFEXOR-XR) 150 MG 24 hr capsule TAKE 1 CAPSULE(150 MG) BY MOUTH DAILY WITH BREAKFAST 10/30/16  Yes Aldine Contes, MD  VICTOZA 18 MG/3ML SOPN ADMINISTER 1.8 MG UNDER THE SKIN DAILY 12/03/16  Yes Aldine Contes, MD  bacitracin-polymyxin b (POLYSPORIN) ophthalmic ointment Place 4 (four) times daily into the right eye. Place a 1/2 inch ribbon of ointment into the lower eyelid. 12/06/16   Bernarda Caffey, MD  glucose blood (ONETOUCH VERIO) test strip USE TO CHECK BLOOD SUGAR TWICE DAILY 09/10/16   Aldine Contes, MD  Insulin Pen Needle (BD PEN NEEDLE NANO U/F) 32G X 4 MM MISC Use to check blood sugar every night at bedtime. Diagnosis code E11.42, Z79.4 11/04/16   Aldine Contes, MD  [facility] DELICA LANCETS FINE [place] USE TO CHECK BLOOD SUGAR TWICE DAILY 04/16/16   Aldine Contes,  MD    Family History Family History  Problem Relation Age of Onset  . Heart disease Father   . Diabetes Father   . Stroke Sister   . Anuerysm Sister 15       brain; maternal half-sister  . Heart disease Brother   . Anuerysm Brother 35       aortic; maternal half-brother  . Breast cancer Daughter 65       negative genetic testing in 2015  . Heart attack Daughter        56-47  . Diabetes Paternal Grandmother   . Stroke Paternal Grandfather   . Anuerysm Brother        NOS type; full brother  . Fibroids Daughter        s/p hysterectomy at 34y  . Brain cancer Maternal Uncle        dx. older than 51; NOS type  . Brain cancer Cousin        maternal 1st cousin; d. early 83s; NOS type  . Deafness Paternal Uncle        prelingual    Social History Social History   Tobacco Use  . Smoking status: Never Smoker  . Smokeless tobacco: Never Used  Substance Use Topics  . Alcohol use: No    Alcohol/week: 0.0 oz  . Drug use: No     Allergies   Gabapentin; Penicillins; and Sulfa antibiotics   Review of Systems Review of Systems  Constitutional: Negative for fever.  Respiratory:  Negative for shortness of breath.   Cardiovascular: Negative for chest pain.  Gastrointestinal: Negative for abdominal pain and vomiting.  Musculoskeletal: Positive for myalgias.  Neurological: Positive for numbness and headaches. Negative for weakness.  All other systems reviewed and are negative.    Physical Exam Updated Vital Signs BP (!) 142/68   Pulse 95   Temp 97.9 F (36.6 C) (Rectal)   Resp 15   SpO2 97%   Physical Exam  Constitutional: She is oriented to person, place, and time. She appears well-developed and well-nourished. She appears distressed.  HENT:  Head: Normocephalic and atraumatic.  Right Ear: External ear normal.  Left Ear: External ear normal.  Nose: Nose normal.  Eyes: Pupils are equal, round, and reactive to light. Right eye exhibits no discharge. Left eye exhibits no discharge.  photophobia  Neck: Neck supple.  Cardiovascular: Regular rhythm and normal heart sounds. Tachycardia present.  Pulmonary/Chest: Effort normal and breath sounds normal.  Abdominal: Soft. There is no tenderness.  Musculoskeletal:  Bilateral lower extremities are stiff.  I am unable to bend her knee due to it causing significant pain.  There seems to be some tightness in the calves.  No focal joint swelling.  Neurological: She is alert and oriented to person, place, and time.  No facial droop, normal speech. 5/5 strength in BUE, with some stiffness in RUE. Normal gross sensation. Bilateral lower extremities have normal sensation. Difficult to move per her but they are not weak but stiff. Some rigidity noted in lower extremities.   Skin: Skin is warm and dry. She is not diaphoretic.  Nursing note and vitals reviewed.    ED Treatments / Results  Labs (all labs ordered are listed, but only abnormal results are displayed) Labs Reviewed  COMPREHENSIVE METABOLIC PANEL - Abnormal; Notable for the following components:      Result Value   Potassium 3.2 (*)    Chloride 99 (*)     Glucose, Bld 274 (*)  Creatinine, Ser 1.01 (*)    ALT 12 (*)    GFR calc non Af Amer 57 (*)    All other components within normal limits  CBC WITH DIFFERENTIAL/PLATELET - Abnormal; Notable for the following components:   Neutro Abs 8.1 (*)    All other components within normal limits  MAGNESIUM - Abnormal; Notable for the following components:   Magnesium 1.1 (*)    All other components within normal limits  I-STAT CHEM 8, ED - Abnormal; Notable for the following components:   Potassium 3.2 (*)    Chloride 99 (*)    Glucose, Bld 278 (*)    Calcium, Ion 1.13 (*)    All other components within normal limits  CBG MONITORING, ED - Abnormal; Notable for the following components:   Glucose-Capillary 257 (*)    All other components within normal limits  CK    EKG  EKG Interpretation  Date/Time:  Friday December 06 2016 15:22:01 EST Ventricular Rate:  101 PR Interval:    QRS Duration: 93 QT Interval:  366 QTC Calculation: 475 R Axis:   -56 Text Interpretation:  Sinus tachycardia Probable left atrial enlargement Left anterior fascicular block Abnormal R-wave progression, late transition no significant change since April 2018 Confirmed by Sherwood Gambler [phone]) on 12/06/2016 3:57:06 PM       Radiology No results found.  Procedures Procedures (including critical care time)  Medications Ordered in ED Medications  magnesium sulfate IVPB 2 g 50 mL (2 g Intravenous New Bag/Given 12/06/16 1626)  potassium chloride 10 mEq in 100 mL IVPB (not administered)  magnesium oxide (MAG-OX) tablet 400 mg (not administered)  sodium chloride 0.9 % bolus 1,000 mL (0 mLs Intravenous Stopped 12/06/16 1617)  LORazepam (ATIVAN) injection 1 mg (1 mg Intravenous Given 12/06/16 1544)  potassium chloride SA (K-DUR,KLOR-CON) CR tablet 40 mEq (40 mEq Oral Given 12/06/16 1626)     Initial Impression / Assessment and Plan / ED Course  I have reviewed the triage vital signs and the nursing  notes.  Pertinent labs & imaging results that were available during my care of the patient were reviewed by me and considered in my medical decision making (see chart for details).     I believe the patient's sudden muscle cramps/spasm/stiffness is from systemic absorption and adverse reaction to Mydriacyl.  On brief research this appears it has a known side effect of muscle rigidity.  She was given fluids and a dose of IV Ativan.  On reevaluation she now is resting comfortably and freely moves all of her extremities and joints.  There is no further stiffness or rigidity.  I do not think this is further side effects from her other medicines or something such as serotonin syndrome.  She does have some hypokalemia which is acute on chronic as well as hypomagnesemia which is also acute on chronic.  Her QTC is under 500. Given that she feels better, she was getting up to walk to be discharged, but was unsteady (likely the ativan). Thus she will be monitored in ED and when she is better able to ambulate will d/c home. Care to Dr. Zenia Resides, likely discharge when able to safely ambulate  Final Clinical Impressions(s) / ED Diagnoses   Final diagnoses:  Muscle spasms of both lower extremities  Muscle spasm  Hypokalemia  Hypomagnesemia    ED Discharge Orders    None       Sherwood Gambler, MD 12/06/16 1718

## 2016-12-06 NOTE — ED Notes (Signed)
ED Provider at bedside. 

## 2016-12-06 NOTE — ED Notes (Signed)
Attempted to ambulated pt, pt not able to walk at this time, very off balance. EDP made aware.

## 2016-12-06 NOTE — ED Triage Notes (Signed)
Pt arrives from [facility] doctor after having eye drops into right eye she began having muscle spasms in right arm and leg, pt feels like room is spinning even with eyes closed. Pt had her eye starched by grandchild last night. Pt has pain in right arm and leg. pt's hand is in what looks like a spasm and leg in straight out pt states unable to bend it.

## 2016-12-09 ENCOUNTER — Other Ambulatory Visit: Payer: Self-pay

## 2016-12-09 ENCOUNTER — Ambulatory Visit (INDEPENDENT_AMBULATORY_CARE_PROVIDER_SITE_OTHER): Payer: Medicare Other | Admitting: Internal Medicine

## 2016-12-09 VITALS — BP 143/74 | HR 104 | Temp 98.0°F | Ht 62.5 in | Wt 133.8 lb

## 2016-12-09 DIAGNOSIS — E876 Hypokalemia: Secondary | ICD-10-CM | POA: Diagnosis not present

## 2016-12-09 DIAGNOSIS — I10 Essential (primary) hypertension: Secondary | ICD-10-CM

## 2016-12-09 DIAGNOSIS — M1990 Unspecified osteoarthritis, unspecified site: Secondary | ICD-10-CM

## 2016-12-09 DIAGNOSIS — G629 Polyneuropathy, unspecified: Secondary | ICD-10-CM | POA: Diagnosis not present

## 2016-12-09 DIAGNOSIS — E785 Hyperlipidemia, unspecified: Secondary | ICD-10-CM | POA: Diagnosis not present

## 2016-12-09 DIAGNOSIS — E1142 Type 2 diabetes mellitus with diabetic polyneuropathy: Secondary | ICD-10-CM

## 2016-12-09 DIAGNOSIS — G5601 Carpal tunnel syndrome, right upper limb: Secondary | ICD-10-CM | POA: Insufficient documentation

## 2016-12-09 HISTORY — DX: Hypokalemia: E87.6

## 2016-12-09 HISTORY — DX: Hypomagnesemia: E83.42

## 2016-12-09 NOTE — Progress Notes (Signed)
CC: ED follow up for muscle spasms  HPI:  [name] is a 66 y.o. female with a past medical history listed below here today for follow up of her recent ED visit on 12/06/16. She presented to the ED with complaints of severe pain in her extremities, worst in her RUE as well as stiffness and limited movement. She had been seen by her ophthalmologist earlier that day after scratching her eye. She had her eyes dilated (1% Mydriacyl and 2.5% phenylephrine) and developed symptoms after taking the eye drops. EDP believed her symptoms to be SE from systemic absorption of Mydriacyl. She received IVF and one dose of IV Ativan. Labs were notable for mild hypokalemia (3.2) and hypomagnesemia (1.1) which were repleted but labs were otherwise unremarkable. Symptoms improved in the ED and she was told to follow up with her PCP.  Today, she reports she is still having pins and needles in her right and arm as well as her finger tips in her left hand. Does reports some feeling of weakness in her right arm. Reports some pain in her right calf that started this morning that she reports feels like a cramping pain. She reports her symptoms improved in the ED but have never completely resolved. Does reports headaches. Denies any nausea, vomiting or muscle rigidity. No chest pain or shortness of breath. No leg edema. Reports history of neuropathy in her legs but reports she has never had symptoms in her arms previously. Does report pain in her arms is similar today.   Past Medical History:  Diagnosis Date  . Arthritis   . Borderline glaucoma of both eyes   . Depression   . Diabetic polyneuropathy ([place])   . Diverticulosis of colon   . Dry eyes, bilateral   . Feeling of incomplete bladder emptying   . History of breast cancer oncologist-  dr Waymon Budge-- per lov note no recurrence   dx 04/ 1999 --- Stage 3B-- s/p  chemotherapy then right mastectomy then concurrent chemoradiation therapy  . History of urinary  retention    01/ 2018  . Hydronephrosis of right kidney   . Hyperlipidemia   . Hypertension   . Hypothyroidism   . Insulin dependent type 2 diabetes mellitus [facility])    endocrinologist-  dr Cruzita Lederer---  last A1c 8.7 on 02-20-2016  . Lymphedema of upper extremity    right  . Macular degeneration, left eye    followed by dr Zigmund Daniel  . Renal insufficiency   . Urgency of urination    Review of Systems:   No chest pain or shortness of breath  Physical Exam:  Vitals:   12/09/16 1406  BP: (!) 143/74  Pulse: (!) 104  Temp: 98 F (36.7 C)  TempSrc: Oral  SpO2: 100%  Weight: 133 lb 12.8 oz (60.7 kg)  Height: 5' 2.5" (1.588 m)   Physical Exam  Constitutional: She is oriented to person, place, and time and well-developed, well-nourished, and in no distress. No distress.  HENT:  Head: Normocephalic and atraumatic.  Cardiovascular: Regular rhythm and normal heart sounds. Tachycardia present.  Pulmonary/Chest: Effort normal and breath sounds normal.  Abdominal: Soft. Bowel sounds are normal.  Musculoskeletal: She exhibits no edema.  Neurological: She is alert and oriented to person, place, and time. She has normal sensation, normal strength, normal reflexes and intact cranial nerves. She displays no weakness. Gait normal.  Skin: Skin is warm and dry. No rash noted.  Psychiatric: Mood and affect normal.  Vitals reviewed.  Assessment & Plan:   See Encounters Tab for problem based charting.  Patient discussed with Dr. Daryll Drown

## 2016-12-09 NOTE — Progress Notes (Signed)
Internal Medicine Clinic Attending  Case discussed with Dr. Boswell at the time of the visit.  We reviewed the resident's history and exam and pertinent patient test results.  I agree with the assessment, diagnosis, and plan of care documented in the resident's note.  

## 2016-12-09 NOTE — Assessment & Plan Note (Signed)
On K supplementation. Re-check labs today.

## 2016-12-09 NOTE — Assessment & Plan Note (Addendum)
Would expect symptoms from Mydriacyl to have resolved within 6-7 hours and not persist over the weekend. Cannot attribute her symptoms today to medication side effect.   Complaints of 'pins and needles' sensation in her right arm and left hand. History of neuropathy in her bilateral LE but has never had symptoms in her UE previously. Symptoms began acutely 3 days ago. Her DM is uncontrolled with most recent A1c in August >14.   Most likely progressive neuropathy symptoms 2/2 her uncontrolled DM however the acuity of her symptoms does not fit with DM neuropathy. Neurologic exam today unremarkable with intact strength and sensation throughout. She is tachycardic today and appears to have chronic tachycardia ~100.   Will check CBC, BMET, TSH (may explain tachycardia, last check 05/2016 wnl), B12, RPR today. Follow up results.

## 2016-12-09 NOTE — Assessment & Plan Note (Signed)
Mg 1.1 in ED 11/9. Started on Mag supplement. Re-check level today.

## 2016-12-09 NOTE — Patient Instructions (Signed)
[name],  I do not have a great explanation of your symptoms. I am not sure that it can be attributed to the medication like the ED said. I am going to check some lab work today. Continue your current medications. Follow up in 2 weeks for re-check.

## 2016-12-10 LAB — CBC
Hematocrit: 34.5 % (ref 34.0–46.6)
Hemoglobin: 11.7 g/dL (ref 11.1–15.9)
MCH: 30.7 pg (ref 26.6–33.0)
MCHC: 33.9 g/dL (ref 31.5–35.7)
MCV: 91 fL (ref 79–97)
Platelets: 283 10*3/uL (ref 150–379)
RBC: 3.81 x10E6/uL (ref 3.77–5.28)
RDW: 14 % (ref 12.3–15.4)
WBC: 6.6 10*3/uL (ref 3.4–10.8)

## 2016-12-10 LAB — HEMOGLOBIN A1C
Est. average glucose Bld gHb Est-mCnc: 292 mg/dL
Hgb A1c MFr Bld: 11.8 % — ABNORMAL HIGH (ref 4.8–5.6)

## 2016-12-10 LAB — BMP8+ANION GAP
Anion Gap: 18 mmol/L (ref 10.0–18.0)
BUN/Creatinine Ratio: 9 — ABNORMAL LOW (ref 12–28)
BUN: 8 mg/dL (ref 8–27)
CO2: 24 mmol/L (ref 20–29)
Calcium: 9.8 mg/dL (ref 8.7–10.3)
Chloride: 98 mmol/L (ref 96–106)
Creatinine, Ser: 0.88 mg/dL (ref 0.57–1.00)
GFR calc Af Amer: 79 mL/min/{1.73_m2} (ref >59)
GFR calc non Af Amer: 69 mL/min/{1.73_m2} (ref >59)
Glucose: 272 mg/dL — ABNORMAL HIGH (ref 65–99)
Potassium: 4.7 mmol/L (ref 3.5–5.2)
Sodium: 140 mmol/L (ref 134–144)

## 2016-12-10 LAB — MAGNESIUM: Magnesium: 1.7 mg/dL (ref 1.6–2.3)

## 2016-12-10 LAB — VITAMIN B12: Vitamin B-12: 501 pg/mL (ref 232–1245)

## 2016-12-10 LAB — RPR: RPR Ser Ql: NONREACTIVE

## 2016-12-10 LAB — TSH: TSH: 4.1 u[IU]/mL (ref 0.450–4.500)

## 2016-12-11 ENCOUNTER — Encounter (INDEPENDENT_AMBULATORY_CARE_PROVIDER_SITE_OTHER): Payer: Medicare Other | Admitting: Ophthalmology

## 2016-12-11 DIAGNOSIS — I10 Essential (primary) hypertension: Secondary | ICD-10-CM | POA: Diagnosis not present

## 2016-12-11 DIAGNOSIS — E11311 Type 2 diabetes mellitus with unspecified diabetic retinopathy with macular edema: Secondary | ICD-10-CM

## 2016-12-11 DIAGNOSIS — E113312 Type 2 diabetes mellitus with moderate nonproliferative diabetic retinopathy with macular edema, left eye: Secondary | ICD-10-CM | POA: Diagnosis not present

## 2016-12-11 DIAGNOSIS — H35033 Hypertensive retinopathy, bilateral: Secondary | ICD-10-CM

## 2016-12-11 DIAGNOSIS — H43813 Vitreous degeneration, bilateral: Secondary | ICD-10-CM | POA: Diagnosis not present

## 2016-12-11 DIAGNOSIS — E113391 Type 2 diabetes mellitus with moderate nonproliferative diabetic retinopathy without macular edema, right eye: Secondary | ICD-10-CM | POA: Diagnosis not present

## 2016-12-12 ENCOUNTER — Encounter (INDEPENDENT_AMBULATORY_CARE_PROVIDER_SITE_OTHER): Payer: Medicare Other | Admitting: Ophthalmology

## 2016-12-12 ENCOUNTER — Ambulatory Visit: Payer: Medicare Other | Admitting: Dietician

## 2016-12-23 ENCOUNTER — Ambulatory Visit: Payer: Medicare Other

## 2016-12-24 ENCOUNTER — Encounter: Payer: Self-pay | Admitting: Internal Medicine

## 2016-12-27 ENCOUNTER — Ambulatory Visit (HOSPITAL_COMMUNITY)
Admission: RE | Admit: 2016-12-27 | Discharge: 2016-12-27 | Disposition: A | Discharge status: Home / Self Care | Payer: Medicare Other | Source: Ambulatory Visit | Attending: Internal Medicine | Admitting: Internal Medicine

## 2016-12-27 ENCOUNTER — Ambulatory Visit (INDEPENDENT_AMBULATORY_CARE_PROVIDER_SITE_OTHER): Payer: Medicare Other | Admitting: Internal Medicine

## 2016-12-27 ENCOUNTER — Other Ambulatory Visit: Payer: Self-pay

## 2016-12-27 ENCOUNTER — Ambulatory Visit (INDEPENDENT_AMBULATORY_CARE_PROVIDER_SITE_OTHER): Payer: Medicare Other | Admitting: Dietician

## 2016-12-27 VITALS — BP 136/72 | HR 95 | Temp 97.9°F | Ht 62.5 in | Wt 129.1 lb

## 2016-12-27 DIAGNOSIS — M5137 Other intervertebral disc degeneration, lumbosacral region: Secondary | ICD-10-CM | POA: Insufficient documentation

## 2016-12-27 DIAGNOSIS — M25552 Pain in left hip: Secondary | ICD-10-CM | POA: Diagnosis not present

## 2016-12-27 DIAGNOSIS — Z713 Dietary counseling and surveillance: Secondary | ICD-10-CM

## 2016-12-27 DIAGNOSIS — M25551 Pain in right hip: Secondary | ICD-10-CM

## 2016-12-27 DIAGNOSIS — Z794 Long term (current) use of insulin: Secondary | ICD-10-CM

## 2016-12-27 DIAGNOSIS — E118 Type 2 diabetes mellitus with unspecified complications: Secondary | ICD-10-CM

## 2016-12-27 DIAGNOSIS — M79605 Pain in left leg: Secondary | ICD-10-CM | POA: Diagnosis not present

## 2016-12-27 MED ORDER — ACETAMINOPHEN 325 MG PO TABS: 650.0000 mg | ORAL_TABLET | Freq: Four times a day (QID) | ORAL | 1 refills | Status: DC | PRN

## 2016-12-27 NOTE — Progress Notes (Signed)
   l Medical Nutrition Therapy:  Appt start time: 1050 end time: 1125  Assessment:  Primary concerns today: blood sugar control. Sindy reports she lost her meter when moving with her daughter into her other daughter's house. She stopped taking all other medicine when she was upset about her daughter's illness. She has restarted them, but due to CGM showing hypoglycemia they were decreasd and her Humalog was stopped on 10/11/18n due to hypoglycemia and her blood sugars have been running high. When her Humalog was stopped about 1 year prior she lost control of her diabeteas and gained control when it was restarted. A new meter (One touch Verio)  was provided for patient today.  She is not eligible for the professional CGM until January. Per observation today, her neuropathy in her hands is impacting her ability to self monitor.   WEIGHT: 129#, BMI 28   Has lost 13-14# , likely due to glycosuria her desired weight is 140-150# BLOOD SUGARS: A1C 11.8% down from >14% in August.  CBG on her meter today is 233 fasting- no food, drink or diabetes medicine today MEDICATIONS: levemir to 12 units at night- she has fallen asleep before taking it in the past, Victoza 1.8 mg qam,  metformin twice daily,   DIETARY INTAKE:Generally healthy intake, small portions. Her meal pattern is variable.   Has not eaten today   Progress Towards Goal(s):  In progress.   Nutritional Diagnosis:  [place]-2.2 Altered nutrition-related laboratory and [place]-2.4 Predicted food-medication interaction As related to lack of adequate blood sugar control is worsened,  still not at goal 7%  As evidenced by A1C of 11.8% and fasting blood sugar of 233 today.  .    Intervention:  Nutrition education: reviewed meter operation and CGM qualifications with patient, reasons for checking post meal blood sugars and more often to prevent hypoglycemia.   Coordination of care- Discussed increasing prandin to take wih all meals when CBG >180 mg/dl with Dr.  Berline Lopes. Which he agreed to. Recommend her levemir be changed to QAM and icnreased and her Humalog restarted low dose. she may benefit from the [facility] due to her neuropathy in her hands note her upcoming appointment with Dr. Renne Crigler in December.  Handouts given during visit include: Meter , AVS Monitoring/Evaluation:  Dietary intake, exercise, meter, and body weight in 3 weeks(s) at group- patient to bring her meter. Will call her to remind her to take the levemir in the am [place], [place] 12/27/2016 4:08 PM.

## 2016-12-27 NOTE — Progress Notes (Signed)
   CC: persistent left hip/leg pain  HPI:  [name] is a 66 y.o. female who presents today for evaluation of pain in her left hip/leg for approximately one month. At first it was bilateral, but is now more limited to the left hip/leg. At first she was unable to stand or walk due to the pain at that time and went to the ED for treatment ultimately for muscle rigidity in her lower extremities thought at the time to be secondary to Mydriacyl side effects and dehydration with associated mild hyperglycemia. This had begun following multiple doses of Mydriacyl by multiple ophthalmologist with subsequent eye exams for a corneal tear secondary to trauma from a child's fingernail. This was treated but developed secondary rigidity.   Left leg pain: Assessment: Bilaterally, but the right has improved significantly with the left worsening. Pain begins in the posterior leg and radiates into the foot.  Moderately relieved with taking 3-4 325mg  acetaminophen tablets Denied loss of bowel or bladder control, complete loss of strength in either leg. Physical exam: significantly worsening pain in the hip with internal rotation, pain originates in the left hip and radiates down the leg from there. No acute back pain unless palpated with significant pressure today overlying the L1-2 vertebra. There is no pain on palpation of L3-5 or S1.  Plan: Acetaminophen 650mg  QID as needed for pain Heating pad PRN X-rays of the hips and pelvis given PE   Past Medical History:  Diagnosis Date  . Arthritis   . Borderline glaucoma of both eyes   . Depression   . Diabetic polyneuropathy ([place])   . Diverticulosis of colon   . Dry eyes, bilateral   . Feeling of incomplete bladder emptying   . History of breast cancer oncologist-  dr Waymon Budge-- per lov note no recurrence   dx 04/ 1999 --- Stage 3B-- s/p  chemotherapy then right mastectomy then concurrent chemoradiation therapy  . History of urinary retention    01/ 2018  . Hydronephrosis of right kidney   . Hyperlipidemia   . Hypertension   . Hypothyroidism   . Insulin dependent type 2 diabetes mellitus [facility])    endocrinologist-  dr Cruzita Lederer---  last A1c 8.7 on 02-20-2016  . Lymphedema of upper extremity    right  . Macular degeneration, left eye    followed by dr Zigmund Daniel  . Renal insufficiency   . Urgency of urination    Review of Systems:  ROS negative except as per HPI.  Physical Exam:  Vitals:   12/27/16 0949  BP: 136/72  Pulse: 95  Temp: 97.9 F (36.6 C)  TempSrc: Oral  SpO2: 100%  Weight: 129 lb 1.6 oz (58.6 kg)  Height: 5' 2.5" (1.588 m)   Physical Exam  Constitutional: She appears well-developed and well-nourished. No distress.  Cardiovascular: Normal rate and regular rhythm.  No murmur heard. Pulmonary/Chest: Effort normal and breath sounds normal. No respiratory distress.  Abdominal: Soft. Bowel sounds are normal. She exhibits no distension. There is no tenderness.  Musculoskeletal: She exhibits tenderness (In the left hip with internal rotation, passive and active straight leg raise). She exhibits no edema or deformity.    Assessment & Plan:   See Encounters Tab for problem based charting.  Patient seen with Dr. Daryll Drown

## 2016-12-27 NOTE — Patient Instructions (Addendum)
FOLLOW-UP INSTRUCTIONS When: Please stop at the front desk and schedule an appointment with Dr. Dareen Piano for his next available opening as soon as he has a spot. For: A routine visit as it has been some time since your last routine visit. What to bring: All of your medications  Please continue to take the acetaminophen but you may take two of the 325mg  (650mg ) tablets up to 3-4 times per day. If your pain worsens or if it fails to improve over the next 3-4 weeks please call us and schedule for an acute visit.  Thank you for visiting the [facility] today.

## 2016-12-27 NOTE — Assessment & Plan Note (Signed)
Left leg pain: Assessment: Bilaterally, but the right has improved significantly with the left worsening. Pain begins in the posterior leg and radiates into the foot.  Moderately relieved with taking 3-4 325mg  acetaminophen tablets Denied loss of bowel or bladder control, complete loss of strength in either leg. Physical exam: significantly worsening pain in the hip with internal rotation, pain originates in the left hip and radiates down the leg from there. No acute back pain unless palpated with significant pressure today overlying the L1-2 vertebra. There is no pain on palpation of L3-5 or S1.  Plan: Acetaminophen 650mg  QID as needed for pain Heating pad PRN X-rays of the hips and pelvis given PE

## 2016-12-27 NOTE — Patient Instructions (Signed)
Check your blood sugar before meals, if more than 180mg /dl, take a PRANDIN pill before/with that meal.  Please bring your meter back to your next visit so we can see if the [facility] is helping.    Call me if your blood sugar is staying higher than 250 for more than 2 days.   [name]   [phone]

## 2016-12-29 NOTE — Progress Notes (Signed)
Internal Medicine Clinic Attending  I saw and evaluated the patient.  I personally confirmed the key portions of the history and exam documented by Dr. Harbrecht and I reviewed pertinent patient test results.  The assessment, diagnosis, and plan were formulated together and I agree with the documentation in the resident's note.  

## 2017-01-01 ENCOUNTER — Telehealth: Payer: Self-pay

## 2017-01-01 NOTE — Telephone Encounter (Signed)
Pt is calling back for test results. Per patient she is still in pain, requesting to speak with the doctor. Please call pt back.

## 2017-01-01 NOTE — Telephone Encounter (Signed)
Requesting x-ray results. Please call back.

## 2017-01-01 NOTE — Telephone Encounter (Signed)
Dr. Berline Lopes please call Ms. Becht with these results

## 2017-01-02 ENCOUNTER — Other Ambulatory Visit: Payer: Self-pay | Admitting: Internal Medicine

## 2017-01-02 MED ORDER — IBUPROFEN 400 MG PO TABS
400.0000 mg | ORAL_TABLET | Freq: Three times a day (TID) | ORAL | 0 refills | Status: DC | PRN
Start: 2017-01-02 — End: 2017-01-30

## 2017-01-02 NOTE — Telephone Encounter (Signed)
Requesting 90 days supply. Thanks

## 2017-01-02 NOTE — Telephone Encounter (Signed)
Returned patients call.

## 2017-01-08 ENCOUNTER — Encounter (INDEPENDENT_AMBULATORY_CARE_PROVIDER_SITE_OTHER): Payer: Medicare Other | Admitting: Ophthalmology

## 2017-01-10 ENCOUNTER — Ambulatory Visit (HOSPITAL_COMMUNITY)
Admission: EM | Admit: 2017-01-10 | Discharge: 2017-01-10 | Disposition: A | Discharge status: Home / Self Care | Payer: Medicare Other | Attending: Internal Medicine | Admitting: Internal Medicine

## 2017-01-10 ENCOUNTER — Encounter (HOSPITAL_COMMUNITY): Payer: Self-pay | Admitting: Emergency Medicine

## 2017-01-10 ENCOUNTER — Encounter (INDEPENDENT_AMBULATORY_CARE_PROVIDER_SITE_OTHER): Payer: Medicare Other | Admitting: Ophthalmology

## 2017-01-10 DIAGNOSIS — M5416 Radiculopathy, lumbar region: Secondary | ICD-10-CM | POA: Diagnosis not present

## 2017-01-10 DIAGNOSIS — E119 Type 2 diabetes mellitus without complications: Secondary | ICD-10-CM | POA: Diagnosis not present

## 2017-01-10 DIAGNOSIS — H43813 Vitreous degeneration, bilateral: Secondary | ICD-10-CM | POA: Diagnosis not present

## 2017-01-10 DIAGNOSIS — Z794 Long term (current) use of insulin: Secondary | ICD-10-CM

## 2017-01-10 DIAGNOSIS — E113391 Type 2 diabetes mellitus with moderate nonproliferative diabetic retinopathy without macular edema, right eye: Secondary | ICD-10-CM

## 2017-01-10 DIAGNOSIS — I10 Essential (primary) hypertension: Secondary | ICD-10-CM

## 2017-01-10 DIAGNOSIS — E113312 Type 2 diabetes mellitus with moderate nonproliferative diabetic retinopathy with macular edema, left eye: Secondary | ICD-10-CM | POA: Diagnosis not present

## 2017-01-10 DIAGNOSIS — H35033 Hypertensive retinopathy, bilateral: Secondary | ICD-10-CM

## 2017-01-10 DIAGNOSIS — E11311 Type 2 diabetes mellitus with unspecified diabetic retinopathy with macular edema: Secondary | ICD-10-CM | POA: Diagnosis not present

## 2017-01-10 MED ORDER — PREDNISONE 5 MG (21) PO TBPK: ORAL_TABLET | ORAL | 0 refills | Status: DC

## 2017-01-10 NOTE — ED Triage Notes (Signed)
PT C/O: LLE pain .... sts it starts from her back and radiates down .... Seen by PCP at [facility] ... X-rays were negative  ONSET: 1 month  SX ALSO INCLUDE: pain increases w/activity  DENIES: inj/trauma  TAKING MEDS: ibup  A&O x4... NAD... Ambulatory .... Cane present upon arrival.

## 2017-01-10 NOTE — ED Provider Notes (Signed)
[place]    CSN: [id number] Arrival date & time: 01/10/17  1512     History   Chief Complaint Chief Complaint  Patient presents with  . Leg Pain    HPI [name] is a 66 y.o. female.   Who carries a remote history of lumbar stenosis. She presents with left buttock and leg pain. She was in to see her PCP 3 weeks ago who did x-rays of her hips and pelvis which were normal. She is taking "pain medications" but her pain continues. It radiates to her foot from her left buttock. No weakness, numbness or loss of bowel or bladder control. Pain with walking and long periods of standing or sitting. No known injury.       Past Medical History:  Diagnosis Date  . Arthritis   . Borderline glaucoma of both eyes   . Depression   . Diabetic polyneuropathy ([place])   . Diverticulosis of colon   . Dry eyes, bilateral   . Feeling of incomplete bladder emptying   . History of breast cancer oncologist-  dr Waymon Budge-- per lov note no recurrence   dx 04/ 1999 --- Stage 3B-- s/p  chemotherapy then right mastectomy then concurrent chemoradiation therapy  . History of urinary retention    01/ 2018  . Hydronephrosis of right kidney   . Hyperlipidemia   . Hypertension   . Hypothyroidism   . Insulin dependent type 2 diabetes mellitus [facility])    endocrinologist-  dr Cruzita Lederer---  last A1c 8.7 on 02-20-2016  . Lymphedema of upper extremity    right  . Macular degeneration, left eye    followed by dr Zigmund Daniel  . Renal insufficiency   . Urgency of urination     Patient Active Problem List   Diagnosis Date Noted  . Neuropathy 12/09/2016  . Hypokalemia 12/09/2016  . Hypomagnesemia 12/09/2016  . Estrogen deficiency 02/27/2016  . Genetic testing 11/27/2015  . Bilateral hip pain 02/24/2015  . Preventative health care 08/26/2012  . HTN, goal below 140/90 08/13/2012  . Chronic kidney disease 08/13/2012  . Glaucoma associated with systemic syndromes(365.44) 08/07/2012  .  Hypothyroidism 04/16/2006  . Anemia 04/16/2006  . Depression 04/16/2006  . LOW BACK PAIN 04/16/2006  . BREAST CANCER, HX OF 04/16/2006  . Hyperlipidemia 10/13/2003  . Type 2 diabetes mellitus with diabetic polyneuropathy, with long-term current use of insulin ([place]) 04/16/1991    Past Surgical History:  Procedure Laterality Date  . CARPAL TUNNEL RELEASE Right 2017   and tendon repair  . CATARACT EXTRACTION W/ INTRAOCULAR LENS  IMPLANT, BILATERAL  2017  . CYSTO/  RIGHT RETROGRADE PYELOGRAM/  UNROOFING RIGHT URETEROCELE  09/18/2000  . CYSTOSCOPY/RETROGRADE/URETEROSCOPY Right 05/09/2016   Procedure: CYSTOSCOPY and right RETROGRADE;  Surgeon: Irine Seal, MD;  Location[facility];  Service: Urology;  Laterality: Right;  . FINGER SURGERY Left x2 prior to 09-09-2014  . I&D EXTREMITY Left 09/09/2014   Procedure: IRRIGATION AND DEBRIDEMENT LEFT THUMB DISTAL PHALANX;  Surgeon: Daryll Brod, MD;  Location: [facility];  Service: Orthopedics;  Laterality: Left;  [name] MASTECTOMY Right 1999   w/  Node dissection's  . PORT-A-CATH REMOVAL  05/01/1999  . TUBAL LIGATION    . VAGINAL HYSTERECTOMY  1970's  . WRIST GANGLION EXCISION Right 2016    OB History    No data available       Home Medications    Prior to Admission medications   Medication Sig Start Date End Date  Taking? Authorizing Provider  amLODipine (NORVASC) 10 MG tablet Take 1 tablet (10 mg total) by mouth daily. 11/07/16  Yes Zada Finders, MD  aspirin EC 81 MG tablet Take 81 mg by mouth at bedtime.   Yes [provider]  bacitracin-polymyxin b (POLYSPORIN) ophthalmic ointment Place 4 (four) times daily into the right eye. Place a 1/2 inch ribbon of ointment into the lower eyelid. 12/06/16  Yes Bernarda Caffey, MD  calcium citrate-vitamin D (CITRACAL+D) 315-200 MG-UNIT tablet Take 1 tablet by mouth 2 (two) times daily. 06/06/16 06/06/17 Yes Aldine Contes, MD  Cholecalciferol (VITAMIN D3) 2000 units  TABS Take 2,000 Units at bedtime by mouth.    Yes [provider]  cycloSPORINE (RESTASIS) 0.05 % ophthalmic emulsion Place 1 drop daily into both eyes.    Yes [provider]  glucose blood (ONETOUCH VERIO) test strip USE TO CHECK BLOOD SUGAR TWICE DAILY 09/10/16  Yes Aldine Contes, MD  hydrochlorothiazide (MICROZIDE) 12.5 MG capsule TAKE 1 CAPSULE BY MOUTH EVERY DAY Patient taking differently: TAKE 1 CAPSULE (12.5 MG) BY MOUTH EVERY DAY AT BEDTIME 05/21/16  Yes Aldine Contes, MD  ibuprofen (ADVIL,MOTRIN) 400 MG tablet Take 1 tablet (400 mg total) by mouth every 8 (eight) hours as needed for moderate pain. 01/02/17  Yes Kathi Ludwig, MD  Insulin Detemir (LEVEMIR FLEXTOUCH) 100 UNIT/ML Pen Inject 12 Units into the skin every morning. Patient taking differently: Inject 17 Units at bedtime into the skin.  11/21/16  Yes Zada Finders, MD  Insulin Pen Needle (BD PEN NEEDLE NANO U/F) 32G X 4 MM MISC Use to check blood sugar every night at bedtime. Diagnosis code E11.42, Z79.4 11/04/16  Yes Aldine Contes, MD  levothyroxine (SYNTHROID, LEVOTHROID) 88 MCG tablet TAKE 1 TABLET BY MOUTH EVERY MORNING BEFORE BREAKFAST Patient taking differently: Take 88 mcg at bedtime by mouth.  09/18/16  Yes Sid Falcon, MD  metFORMIN (GLUCOPHAGE) 1000 MG tablet TAKE 1 TABLET(1000 MG) BY MOUTH TWICE DAILY WITH A MEAL Patient taking differently: Take 1,000 mg 2 (two) times daily with a meal by mouth.  09/11/16  Yes Shela Leff, MD  Olopatadine HCl (PATADAY) 0.2 % SOLN Place 1 drop daily into both eyes.    Yes [provider]  [facility] DELICA LANCETS FINE MISC USE TO CHECK BLOOD SUGAR TWICE DAILY 04/16/16  Yes Aldine Contes, MD  Polyethyl Glycol-Propyl Glycol (SYSTANE) 0.4-0.3 % GEL ophthalmic gel Place 1 application daily as needed into both eyes (dry eyes/irritation).    Yes [provider]  potassium chloride 20 MEQ/15ML (10%) SOLN Take 15 mLs (20 mEq total) by  mouth daily as needed (for muscle cramps). 11/22/16  Yes Aldine Contes, MD  pregabalin (LYRICA) 50 MG capsule Take 1 capsule (50 mg total) by mouth 3 (three) times daily. 10/01/16 10/01/17 Yes Aldine Contes, MD  repaglinide (PRANDIN) 0.5 MG tablet TAKE 1 TABLET(0.5 MG) BY MOUTH DAILY WITH SUPPER. Can double dose if eating large meal. Patient taking differently: Take 0.5 mg daily with supper by mouth.  11/21/16  Yes Zada Finders, MD  rosuvastatin (CRESTOR) 20 MG tablet TAKE 1 TABLET(20 MG) BY MOUTH DAILY 08/26/16  Yes Aldine Contes, MD  venlafaxine XR (EFFEXOR-XR) 150 MG 24 hr capsule TAKE 1 CAPSULE(150 MG) BY MOUTH DAILY WITH BREAKFAST 10/30/16  Yes Aldine Contes, MD  VICTOZA 18 MG/3ML SOPN ADMINISTER 1.8 MG UNDER THE SKIN DAILY 12/03/16  Yes Aldine Contes, MD  acetaminophen (MAPAP) 325 MG tablet Take 2 tablets (650 mg total) by mouth every 6 (  six) hours as needed for moderate pain. 12/27/16   Kathi Ludwig, MD  glucose 4 GM chewable tablet Chew 4 tablets (16 g total) by mouth as needed for low blood sugar. 05/28/12   Dominic Pea, DO  magnesium oxide (MAG-OX) 400 MG tablet Take 1 tablet (400 mg total) 2 (two) times daily by mouth. 12/06/16   Sherwood Gambler, MD  polyethylene glycol (MIRALAX / Floria Raveling) packet Take 17 g daily as needed by mouth (constipation). Mix in 8 oz liquid and drink    [provider]  potassium chloride SA (K-DUR,KLOR-CON) 20 MEQ tablet Take 1 tablet (20 mEq total) 2 (two) times daily for 3 days by mouth. 12/06/16 12/09/16  Sherwood Gambler, MD  predniSONE (STERAPRED UNI-PAK 21 TAB) 5 MG (21) TBPK tablet Take as directed 01/10/17   Bjorn Pippin, PA-C    Family History Family History  Problem Relation Age of Onset  . Heart disease Father   . Diabetes Father   . Stroke Sister   . Anuerysm Sister 71       brain; maternal half-sister  . Heart disease Brother   . Anuerysm Brother 48       aortic; maternal half-brother  . Breast cancer  Daughter 68       negative genetic testing in 2015  . Heart attack Daughter        27-47  . Diabetes Paternal Grandmother   . Stroke Paternal Grandfather   . Anuerysm Brother        NOS type; full brother  . Fibroids Daughter        s/p hysterectomy at 49y  . Brain cancer Maternal Uncle        dx. older than 78; NOS type  . Brain cancer Cousin        maternal 1st cousin; d. early 45s; NOS type  . Deafness Paternal Uncle        prelingual    Social History Social History   Tobacco Use  . Smoking status: Never Smoker  . Smokeless tobacco: Never Used  Substance Use Topics  . Alcohol use: No    Alcohol/week: 0.0 oz  . Drug use: No     Allergies   Gabapentin; Penicillins; and Sulfa antibiotics   Review of Systems Review of Systems  Genitourinary: Negative for dysuria, pelvic pain and urgency.  Musculoskeletal: Positive for back pain.  Skin: Negative for rash.  Neurological: Negative for weakness.  Psychiatric/Behavioral: Negative.      Physical Exam Triage Vital Signs ED Triage Vitals  Enc Vitals Group     BP 01/10/17 1537 123/76     Pulse Rate 01/10/17 1537 91     Resp 01/10/17 1537 18     Temp 01/10/17 1537 98 F (36.7 C)     Temp Source 01/10/17 1537 Oral     SpO2 01/10/17 1537 98 %     Weight --      Height --      Head Circumference --      Peak Flow --      Pain Score 01/10/17 1538 8     Pain Loc --      Pain Edu? --      Excl. in [place]? --    No data found.  Updated Vital Signs BP 123/76 (BP Location: Left Arm)   Pulse 91   Temp 98 F (36.7 C) (Oral)   Resp 18   SpO2 98%   Visual Acuity Right Eye Distance:  Left Eye Distance:   Bilateral Distance:    Right Eye Near:   Left Eye Near:    Bilateral Near:     Physical Exam  Constitutional: She is oriented to person, place, and time. She appears well-developed and well-nourished. No distress.  Musculoskeletal: She exhibits no edema, tenderness or deformity.  +SLR on the left, some pain  with extension of the lumbar spine, though full ROM in all planes  Neurological: She is alert and oriented to person, place, and time. She displays normal reflexes. No sensory deficit. She exhibits normal muscle tone.  Skin: Skin is warm. She is not diaphoretic.  Psychiatric: Her behavior is normal.  Nursing note and vitals reviewed.    UC Treatments / Results  Labs (all labs ordered are listed, but only abnormal results are displayed) Labs Reviewed - No data to display  EKG  EKG Interpretation None       Radiology No results found.  Procedures Procedures (including critical care time)  Medications Ordered in UC Medications - No data to display   Initial Impression / Assessment and Plan / UC Course  I have reviewed the triage vital signs and the nursing notes.  Pertinent labs & imaging results that were available during my care of the patient were reviewed by me and considered in my medical decision making (see chart for details).     History and exam c/w lumbar radiculopathy. Will treat with prednisone pack with close monitoring of her glucose. She is aware. She will call Dr. Rolena Infante office to establish care as she has no current Orthopedist for this. May need PT vs. Injections.   Final Clinical Impressions(s) / UC Diagnoses   Final diagnoses:  Lumbar radiculopathy, acute  Type 2 diabetes mellitus without complication, with long-term current use of insulin [facility])    ED Discharge Orders        Ordered    predniSONE (STERAPRED UNI-PAK 21 TAB) 5 MG (21) TBPK tablet     01/10/17 1558       Controlled Substance Prescriptions [place] Controlled Substance Registry consulted? Not Applicable   Bjorn Pippin, PA-C 01/10/17 1623

## 2017-01-10 NOTE — Discharge Instructions (Signed)
You have a nerve that is irritated in your back which will give you buttock and leg pain. The prednisone will help with inflammation, but you must watch your sugars and adjust accordingly.

## 2017-01-16 ENCOUNTER — Other Ambulatory Visit: Payer: Self-pay | Admitting: Dietician

## 2017-01-16 ENCOUNTER — Telehealth: Payer: Self-pay | Admitting: Dietician

## 2017-01-16 ENCOUNTER — Encounter: Payer: Self-pay | Admitting: Dietician

## 2017-01-16 ENCOUNTER — Ambulatory Visit: Payer: Medicare Other | Admitting: Dietician

## 2017-01-16 DIAGNOSIS — Z794 Long term (current) use of insulin: Principal | ICD-10-CM

## 2017-01-16 DIAGNOSIS — E1142 Type 2 diabetes mellitus with diabetic polyneuropathy: Secondary | ICD-10-CM

## 2017-01-16 NOTE — Telephone Encounter (Signed)
Steroid-Induced Hyperglycemia Prevention and Management [name] is a 66 y.o. female who meets criteria for [facility] glucose monitoring program (diabetes patient prescribed short course of steroids).  A/P Current Regimen  Patient prescribed prednisone dose pack on 01/10/17. Patient not taking prednisone because she was scared it would run her blood sugar up. She still has pain and is taking tylenol for it.   Prednisone indication: lumbar radiculopathy  Follow-up none  02/13/17 for daibetes group and 04/01/17 with her PCP  [place], [name] 4:15 PM 01/16/2017

## 2017-01-16 NOTE — Progress Notes (Unsigned)
Referral requested for additional Medical nutrition therapy hours in 2018 to assist with increased A1C.

## 2017-01-16 NOTE — Progress Notes (Signed)
   l Medical Nutrition Therapy:  Appt start time: 5277 end time: 1115  Assessment:  Primary concerns today: blood sugar control. Halea attended diabetes education group visit today for 30 minutes. The program included successes and challenges over the past few weeks, goal setting and evaluation,and how to stay on track over the holidays.   WEIGHT:not done today BLOOD SUGARS: A1C 11.8% down from >14% in August.  CBG on her meter today is 80, average is 234 of 14 readings on the past 30 days. , trend shows improving blood sugars MEDICATIONS: levemir to 12 units,  Victoza 1.8 mg qam,  metformin twice daily, prandin  DIETARY INTAKE:Generally healthy intake, small portions. Her meal pattern is variable.     Progress Towards Goal(s):  In progress.   Nutritional Diagnosis:  [place]-2.2 Altered nutrition-related laboratory and [place]-2.4 Predicted food-medication interaction As related to lack of adequate blood sugar control is worsened,  still not at goal 7%  As evidenced by most recent A1C of 11.8%.     Intervention:  Nutrition education: reviewed meter operation and CGM qualifications with patient, reasons for checking post meal blood sugars and more often to prevent hypoglycemia.   Coordination of care-   Handouts given during visit include: Meter , AVS Monitoring/Evaluation:  Dietary intake, exercise, meter, and body weight in 3 weeks(s) at group.01/16/2017 4:52 PM.

## 2017-01-23 ENCOUNTER — Encounter: Payer: Self-pay | Admitting: Internal Medicine

## 2017-01-23 ENCOUNTER — Ambulatory Visit (INDEPENDENT_AMBULATORY_CARE_PROVIDER_SITE_OTHER): Payer: Medicare Other | Admitting: Internal Medicine

## 2017-01-23 VITALS — BP 130/80 | HR 112 | Ht 62.5 in | Wt 126.4 lb

## 2017-01-23 DIAGNOSIS — E1142 Type 2 diabetes mellitus with diabetic polyneuropathy: Secondary | ICD-10-CM

## 2017-01-23 DIAGNOSIS — E039 Hypothyroidism, unspecified: Secondary | ICD-10-CM

## 2017-01-23 DIAGNOSIS — Z794 Long term (current) use of insulin: Secondary | ICD-10-CM | POA: Diagnosis not present

## 2017-01-23 MED ORDER — REPAGLINIDE 0.5 MG PO TABS: 0.5000 mg | ORAL_TABLET | Freq: Three times a day (TID) | ORAL | 3 refills | Status: DC

## 2017-01-23 MED ORDER — INSULIN DETEMIR 100 UNIT/ML FLEXPEN: 14.0000 [IU] | PEN_INJECTOR | Freq: Every day | SUBCUTANEOUS | Status: DC

## 2017-01-23 NOTE — Progress Notes (Signed)
Patient ID: [name], female   DOB: October 02, 1950, 66 y.o.   MRN: [id number]   HPI: [name] is a 66 y.o.-year-old female, returning for follow-up for DM2, dx in 1991-1992, insulin-dependent since 2000s, uncontrolled, with medication noncompliance;  + complications (DR, PN). Last visit 10 months ago.  She had a lot of stress since last visit.  Daughter has a brain tumor.   She is off Humalog - stopped after our last visit. She was also missing the rest of the medications.  Last hemoglobin A1c was: Lab Results  Component Value Date   HGBA1C 11.8 (H) 12/09/2016   HGBA1C >14.0 09/03/2016   HGBA1C 7.7 05/28/2016   Pt is on a regimen of: - Metformin 1000 mg 2x a day, with meals - Victoza 1.8 mg in am before b'fast  - Levemir 17 >> 12 units at bedtime. - Prandin 0.5 mg before supper -added by PCP   Pt checks her sugars 1-2x a day >> sugars improved in the last 2 weeks: - am: 56-99, 186 >> 90, 116-146, 164 >> 75, 88, 282 >> 223-449, but after 01/12/2017: 80-174 - 2h after b'fast: n/c >> 132 - before lunch:80, 87-135, if no Humalog: 180-239 >> 94-156, 171, 308 >> 151 - 2h after lunch: 126, 214 >> 143, 315 >> 95, 189 >> 151 - before dinner: 56-118 >> 114-149, but if forgets Humalog with lunch: 238, 250  >>93, 186 >> n/c - 2h after dinner: 413 >> 107, 201 >> 163, 257 - bedtime: 164 >> 108, 256 >> 220 - nighttime: n/c >> 67 x1 - Christmas Eve >> 61 x1 >> 80 Lowest sugar was 61 >> 80; she has hypoglycemia awareness at 80.  Highest sugar was 301 >> 449  Glucometer: One Touch Verio  She continues to see the diabetes educator.  - No CKD, last BUN/creatinine:  Lab Results  Component Value Date   BUN 8 12/09/2016   BUN 18 12/06/2016   CREATININE 0.88 12/09/2016   CREATININE 0.80 12/06/2016   -+ HL; last set of lipids: Lab Results  Component Value Date   CHOL 183 05/28/2016   HDL 52 05/28/2016   LDLCALC 115 (H) 05/28/2016   TRIG 82 05/28/2016   CHOLHDL 3.5 05/28/2016  On  Crestor. - last eye exam was in 12/2016: + DR. Gets IO inj's. - She has numbness and tingling in her feet.  She also has hypothyroidism.  Latest TSH was normal: Lab Results  Component Value Date   TSH 4.100 12/09/2016   She is  on 88 mcg levothyroxine daily.  ROS: Constitutional: no weight gain/+ weight loss, + fatigue, no subjective hyperthermia, no subjective hypothermia Eyes: no blurry vision, no xerophthalmia ENT: no sore throat, no nodules palpated in throat, no dysphagia, no odynophagia, no hoarseness Cardiovascular: no CP/no SOB/no palpitations/no leg swelling Respiratory: no cough/no SOB/no wheezing Gastrointestinal: no N/no V/no D/no C/no acid reflux Musculoskeletal: no muscle aches/no joint aches Skin: no rashes, no hair loss Neurological: no tremors/+ numbness/+ tingling/no dizziness  I reviewed pt's medications, allergies, PMH, social hx, family hx, and changes were documented in the history of present illness. Otherwise, unchanged from my initial visit note.  Past Medical History:  Diagnosis Date  . Arthritis   . Borderline glaucoma of both eyes   . Depression   . Diabetic polyneuropathy ([place])   . Diverticulosis of colon   . Dry eyes, bilateral   . Feeling of incomplete bladder emptying   . History of breast cancer oncologist-  dr Waymon Budge-- per lov note no recurrence   dx 04/ 1999 --- Stage 3B-- s/p  chemotherapy then right mastectomy then concurrent chemoradiation therapy  . History of urinary retention    01/ 2018  . Hydronephrosis of right kidney   . Hyperlipidemia   . Hypertension   . Hypothyroidism   . Insulin dependent type 2 diabetes mellitus [facility])    endocrinologist-  dr Cruzita Lederer---  last A1c 8.7 on 02-20-2016  . Lymphedema of upper extremity    right  . Macular degeneration, left eye    followed by dr Zigmund Daniel  . Renal insufficiency   . Urgency of urination    Past Surgical History:  Procedure Laterality Date  . CARPAL TUNNEL RELEASE Right  2017   and tendon repair  . CATARACT EXTRACTION W/ INTRAOCULAR LENS  IMPLANT, BILATERAL  2017  . CYSTO/  RIGHT RETROGRADE PYELOGRAM/  UNROOFING RIGHT URETEROCELE  09/18/2000  . CYSTOSCOPY/RETROGRADE/URETEROSCOPY Right 05/09/2016   Procedure: CYSTOSCOPY and right RETROGRADE;  Surgeon: Irine Seal, MD;  Location: [facility];  Service: Urology;  Laterality: Right;  . FINGER SURGERY Left x2 prior to 09-09-2014  . I&D EXTREMITY Left 09/09/2014   Procedure: IRRIGATION AND DEBRIDEMENT LEFT THUMB DISTAL PHALANX;  Surgeon: Daryll Brod, MD;  Location: [place];  Service: Orthopedics;  Laterality: Left;  [name] MASTECTOMY Right 1999   w/  Node dissection's  . PORT-A-CATH REMOVAL  05/01/1999  . TUBAL LIGATION    . VAGINAL HYSTERECTOMY  1970's  . WRIST GANGLION EXCISION Right 2016   Social History   Social History  . Marital status: Widow     Spouse name: N/A  . Number of children: 4   Occupational History  . Retired   Social History Main Topics  . Smoking status: Smoker  . Smokeless tobacco: Never Used  . Alcohol use yes  . Drug use: yes   Current Outpatient Medications on File Prior to Visit  Medication Sig Dispense Refill  . acetaminophen (MAPAP) 325 MG tablet Take 2 tablets (650 mg total) by mouth every 6 (six) hours as needed for moderate pain. 90 tablet 1  . amLODipine (NORVASC) 10 MG tablet Take 1 tablet (10 mg total) by mouth daily. 90 tablet 0  . aspirin EC 81 MG tablet Take 81 mg by mouth at bedtime.    . bacitracin-polymyxin b (POLYSPORIN) ophthalmic ointment Place 4 (four) times daily into the right eye. Place a 1/2 inch ribbon of ointment into the lower eyelid. 3.5 g 0  . calcium citrate-vitamin D (CITRACAL+D) 315-200 MG-UNIT tablet Take 1 tablet by mouth 2 (two) times daily. 100 tablet 3  . Cholecalciferol (VITAMIN D3) 2000 units TABS Take 2,000 Units at bedtime by mouth.     . cycloSPORINE (RESTASIS) 0.05 % ophthalmic emulsion Place 1 drop daily into  both eyes.     [name] glucose 4 GM chewable tablet Chew 4 tablets (16 g total) by mouth as needed for low blood sugar. 50 tablet 12  . glucose blood (ONETOUCH VERIO) test strip USE TO CHECK BLOOD SUGAR TWICE DAILY 200 each 3  . hydrochlorothiazide (MICROZIDE) 12.5 MG capsule TAKE 1 CAPSULE BY MOUTH EVERY DAY (Patient taking differently: TAKE 1 CAPSULE (12.5 MG) BY MOUTH EVERY DAY AT BEDTIME) 90 capsule 3  . ibuprofen (ADVIL,MOTRIN) 400 MG tablet Take 1 tablet (400 mg total) by mouth every 8 (eight) hours as needed for moderate pain. 30 tablet 0  . Insulin Detemir (LEVEMIR FLEXTOUCH) 100 UNIT/ML Pen Inject  12 Units into the skin every morning. (Patient taking differently: Inject 17 Units at bedtime into the skin. ) 15 mL 2  . Insulin Pen Needle (BD PEN NEEDLE NANO U/F) 32G X 4 MM MISC Use to check blood sugar every night at bedtime. Diagnosis code E11.42, Z79.4 100 each 0  . levothyroxine (SYNTHROID, LEVOTHROID) 88 MCG tablet TAKE 1 TABLET BY MOUTH EVERY MORNING BEFORE BREAKFAST (Patient taking differently: Take 88 mcg at bedtime by mouth. ) 90 tablet 1  . magnesium oxide (MAG-OX) 400 MG tablet Take 1 tablet (400 mg total) 2 (two) times daily by mouth. 14 tablet 0  . metFORMIN (GLUCOPHAGE) 1000 MG tablet TAKE 1 TABLET(1000 MG) BY MOUTH TWICE DAILY WITH A MEAL (Patient taking differently: Take 1,000 mg 2 (two) times daily with a meal by mouth. ) 180 tablet 3  . Olopatadine HCl (PATADAY) 0.2 % SOLN Place 1 drop daily into both eyes.     Glory Rosebush DELICA LANCETS FINE MISC USE TO CHECK BLOOD SUGAR TWICE DAILY 200 each 3  . Polyethyl Glycol-Propyl Glycol (SYSTANE) 0.4-0.3 % GEL ophthalmic gel Place 1 application daily as needed into both eyes (dry eyes/irritation).     . polyethylene glycol (MIRALAX / GLYCOLAX) packet Take 17 g daily as needed by mouth (constipation). Mix in 8 oz liquid and drink    . potassium chloride 20 MEQ/15ML (10%) SOLN Take 15 mLs (20 mEq total) by mouth daily as needed (for muscle  cramps). 240 mL 0  . potassium chloride SA (K-DUR,KLOR-CON) 20 MEQ tablet Take 1 tablet (20 mEq total) 2 (two) times daily for 3 days by mouth. 6 tablet 0  . predniSONE (STERAPRED UNI-PAK 21 TAB) 5 MG (21) TBPK tablet Take as directed 21 tablet 0  . pregabalin (LYRICA) 50 MG capsule Take 1 capsule (50 mg total) by mouth 3 (three) times daily. 90 capsule 2  . repaglinide (PRANDIN) 0.5 MG tablet TAKE 1 TABLET(0.5 MG) BY MOUTH DAILY WITH SUPPER. Can double dose if eating large meal. (Patient taking differently: Take 0.5 mg daily with supper by mouth. ) 90 tablet 1  . rosuvastatin (CRESTOR) 20 MG tablet TAKE 1 TABLET(20 MG) BY MOUTH DAILY 90 tablet 1  . venlafaxine XR (EFFEXOR-XR) 150 MG 24 hr capsule TAKE 1 CAPSULE(150 MG) BY MOUTH DAILY WITH BREAKFAST 90 capsule 0  . VICTOZA 18 MG/3ML SOPN ADMINISTER 1.8 MG UNDER THE SKIN DAILY 9 mL 0   No current facility-administered medications on file prior to visit.    Allergies  Allergen Reactions  . Gabapentin Other (See Comments)    Dizziness from 300 mg, tolerates 100 mg  . Penicillins Rash    Has patient had a PCN reaction causing immediate rash, facial/tongue/throat swelling, SOB or lightheadedness with hypotension: Yes Has patient had a PCN reaction causing severe rash involving mucus membranes or skin necrosis: No Has patient had a PCN reaction that required hospitalization: pt was in hospital at time of reaction Has patient had a PCN reaction occurring within the last 10 years: No If all of the above answers are "NO", then may proceed with Cephalosporin use.  . Sulfa Antibiotics Rash   Family History  Problem Relation Age of Onset  . Heart disease Father   . Diabetes Father   . Stroke Sister   . Anuerysm Sister 61       brain; maternal half-sister  . Heart disease Brother   . Anuerysm Brother 9       aortic; maternal half-brother  .  Breast cancer Daughter 21       negative genetic testing in 2015  . Heart attack Daughter        38-47   . Diabetes Paternal Grandmother   . Stroke Paternal Grandfather   . Anuerysm Brother        NOS type; full brother  . Fibroids Daughter        s/p hysterectomy at 10y  . Brain cancer Maternal Uncle        dx. older than 36; NOS type  . Brain cancer Cousin        maternal 1st cousin; d. early 20s; NOS type  . Deafness Paternal Uncle        prelingual   PE: BP 130/80   Pulse (!) 112   Ht 5' 2.5" (1.588 m)   Wt 126 lb 6.4 oz (57.3 kg)   SpO2 98%   BMI 22.75 kg/m  Wt Readings from Last 3 Encounters:  01/23/17 126 lb 6.4 oz (57.3 kg)  12/27/16 129 lb 1.6 oz (58.6 kg)  12/09/16 133 lb 12.8 oz (60.7 kg)   Constitutional: normal weight, in NAD Eyes: PERRLA, EOMI, no exophthalmos ENT: moist mucous membranes, no thyromegaly, no cervical lymphadenopathy Cardiovascular: tachycardia, RR, No MRG Respiratory: CTA B Gastrointestinal: abdomen soft, NT, ND, BS+ Musculoskeletal: no deformities, strength intact in all 4 Skin: moist, warm, no rashes Neurological: no tremor with outstretched hands, DTR normal in all 4  ASSESSMENT: 1. DM2, insulin-dependent, uncontrolled, with complications - DR - PN  2. Hypothyroidism  PLAN:  1. Patient with long-standing, uncontrolled, diabetes, on insulin, GLP-1 receptor agonist, and metformin  returning after a long absence.  Since last visit, her HbA1c increased dramatically from 7.7% in spring of this year to 11.8% last month. - I reviewed her most recent visit with PCP and at that time, she was not taking her Humalog.   At last visit, she was telling me that her sugars are at goal when she is taking the Humalog but they increase to 200s if she forgets it.  At that time, I again emphasized the need to take the Humalog with every meal. - At this visit, she confirms that she is not taking the Humalog and has not been doing so for many months.  She was started on Prandin by PCP.  She only takes this before dinner.  Her Levemir dose was initially increased  and then decreased to 12 units and moved in a.m. - Sugars have been really high, between 200s and 400s up to 2 weeks ago when they started to improve.  They are still higher after meals and so I advised her to increase Prandin to take it before every meal.  We may need to increase the dose to 1 mg before meals at next visit.  However, I am not sure whether Prandin would be enough for her since I do suspect that she has a degree of insulin deficiency and may need mealtime insulin. - We will also increase her Levemir in the morning a little - I suggested to:  Patient Instructions  Please continue: - Metformin 1000 mg 2x a day, with meals - Victoza 1.8 mg in am before b'fast   Please increase: - Levemir 14 units in am  Please increase: - Prandin 0.5 mg before each meal  Please return in 1.5 months with your sugar log.   - continue checking sugars at different times of the day - check 3x a day, rotating checks -  advised for yearly eye exams >> she is UTD - Return to clinic in 3 mo with sugar log   2. Hypothyroidism -This is controlled, with last TSH being normal -She takes levothyroxine 88 mcg daily  Philemon Kingdom, MD PhD [facility] Endocrinology

## 2017-01-23 NOTE — Patient Instructions (Addendum)
Please continue: - Metformin 1000 mg 2x a day, with meals - Victoza 1.8 mg in am before b'fast   Please increase: - Levemir 14 units in am  Please increase: - Prandin 0.5 mg before each meal  Please return in 1.5 months with your sugar log.

## 2017-01-24 ENCOUNTER — Other Ambulatory Visit: Payer: Self-pay | Admitting: Internal Medicine

## 2017-01-24 NOTE — Telephone Encounter (Signed)
Pt called / informed of new rx for Effexor.

## 2017-01-24 NOTE — Telephone Encounter (Signed)
Talked to pt - stated she's out of Ibuprofen 400 mg and MAPAP is not helping the leg pain which is constant ; had difficulty walking when she got out the bed this morning. Appt with Ortho surgery is 1/8. Wants to know if there's something else she can try?

## 2017-01-24 NOTE — Telephone Encounter (Signed)
Viney calls saying that she could not get thorugh on the triage line: she cannot walk on her left leg due to constant pain.  Tylenol and ibuprophen not helping Called orthopedic surgery and they cannot help her.  Call transferred to triage nurse.

## 2017-01-29 ENCOUNTER — Telehealth: Payer: Self-pay | Admitting: *Deleted

## 2017-01-29 NOTE — Telephone Encounter (Signed)
Will hold off on refill till her appointment in the AM

## 2017-01-29 NOTE — Telephone Encounter (Signed)
Pt calls and states she needs refill on MAPAP, states she has been taking 2 tablets every 2 hours due to the pain. appt set for 1/3 at [place]

## 2017-01-30 ENCOUNTER — Other Ambulatory Visit: Payer: Self-pay | Admitting: Internal Medicine

## 2017-01-30 ENCOUNTER — Ambulatory Visit (INDEPENDENT_AMBULATORY_CARE_PROVIDER_SITE_OTHER): Payer: Medicare Other | Admitting: Internal Medicine

## 2017-01-30 ENCOUNTER — Encounter: Payer: Self-pay | Admitting: Internal Medicine

## 2017-01-30 VITALS — BP 126/80 | HR 109 | Temp 98.3°F | Wt 128.0 lb

## 2017-01-30 DIAGNOSIS — E785 Hyperlipidemia, unspecified: Secondary | ICD-10-CM

## 2017-01-30 DIAGNOSIS — Z791 Long term (current) use of non-steroidal anti-inflammatories (NSAID): Secondary | ICD-10-CM

## 2017-01-30 DIAGNOSIS — Z9010 Acquired absence of unspecified breast and nipple: Secondary | ICD-10-CM | POA: Diagnosis not present

## 2017-01-30 DIAGNOSIS — M5117 Intervertebral disc disorders with radiculopathy, lumbosacral region: Secondary | ICD-10-CM

## 2017-01-30 DIAGNOSIS — E119 Type 2 diabetes mellitus without complications: Secondary | ICD-10-CM

## 2017-01-30 DIAGNOSIS — E039 Hypothyroidism, unspecified: Secondary | ICD-10-CM | POA: Diagnosis not present

## 2017-01-30 DIAGNOSIS — Z853 Personal history of malignant neoplasm of breast: Secondary | ICD-10-CM

## 2017-01-30 DIAGNOSIS — M5432 Sciatica, left side: Secondary | ICD-10-CM | POA: Insufficient documentation

## 2017-01-30 DIAGNOSIS — M4807 Spinal stenosis, lumbosacral region: Secondary | ICD-10-CM | POA: Diagnosis not present

## 2017-01-30 DIAGNOSIS — I10 Essential (primary) hypertension: Secondary | ICD-10-CM

## 2017-01-30 DIAGNOSIS — F329 Major depressive disorder, single episode, unspecified: Secondary | ICD-10-CM | POA: Diagnosis not present

## 2017-01-30 MED ORDER — IBUPROFEN 400 MG PO TABS: 400.0000 mg | ORAL_TABLET | Freq: Three times a day (TID) | ORAL | 0 refills | Status: DC | PRN

## 2017-01-30 MED ORDER — ACETAMINOPHEN 325 MG PO TABS: 650.0000 mg | ORAL_TABLET | Freq: Four times a day (QID) | ORAL | 0 refills | Status: DC | PRN

## 2017-01-30 NOTE — Progress Notes (Signed)
CC: left leg pain  HPI:  [name] is a 67 y.o. with a PMH of breast cancer s/p mastectomy, MDD, T2DM, HTN, HLD, Hypothyroidism, presenting to clinic for follow up on her left leg pain.  Patient states pain began about 2 months ago without inciting event; she describes the pain as shooting and dull starting in her left buttocks and radiating down the post aspect of her leg and the plantar aspect of her left foot. She reports subjective numbness in the plantar aspect of her foot only. She denies weakness, falls, unstable gait, bowel or bladder incontinence, saddle parasthesia; she denies back pain. Pain is exacerbated sometimes by lying supine in extended position and by pressure on the left buttock when sitting and lying. She was seen in clinic for this at which time she was prescribed ibuprofen 400mg  q8hr prn and tylenol 625mg  q6hr prn. She states the combination of these two worked well to dull the pain but she ran out of ibuprofen a few weeks ago and tylenol by itself will only last about 2 hrs at which time she will take another dose. She reports max amt of tylenol per 24 hrs was 1.875g. She has not tried heat yet. She was seen in the ED 12/14 and was prescribed a prednisone dose pack which she picked up but has not started due to fear of uncontrolled glucose. XR of bil hips and pelvis on 11/30 (clinic visit) showed advanced L5-S1 disc degeneration. MRI lumbar spine in 2011 showed advanced facet arthropathy of L4-L5 with circumferential herniation of disc R>L as well as severe central canal stenosis and right L4 nerve root compression; L5-S1 showed mild bulging of the disc without compressive stenosis.   Please see problem based Assessment and Plan for status of patients chronic conditions.  Past Medical History:  Diagnosis Date  . Arthritis   . Borderline glaucoma of both eyes   . Depression   . Diabetic polyneuropathy ([place])   . Diverticulosis of colon   . Dry eyes, bilateral   .  Feeling of incomplete bladder emptying   . History of breast cancer oncologist-  dr [name]-- per lov note no recurrence   dx 04/ 1999 --- Stage 3B-- s/p  chemotherapy then right mastectomy then concurrent chemoradiation therapy  . History of urinary retention    01/ 2018  . Hydronephrosis of right kidney   . Hyperlipidemia   . Hypertension   . Hypothyroidism   . Insulin dependent type 2 diabetes mellitus [facility])    endocrinologist-  dr [name]---  last A1c 8.7 on 02-20-2016  . Lymphedema of upper extremity    right  . Macular degeneration, left eye    followed by dr [name]  . Renal insufficiency   . Urgency of urination     Review of Systems:   ROS Per HPI  Physical Exam:  Vitals:   01/30/17 0949  BP: 126/80  Pulse: (!) 109  Temp: 98.3 F (36.8 C)  TempSrc: Oral  Weight: 128 lb (58.1 kg)   GENERAL- alert, co-operative, appears as stated age, not in any distress. CARDIAC- RRR, no murmurs, rubs or gallops. RESP- Moving equal volumes of air, and clear to auscultation bilaterally, no wheezes or crackles.[name] NEURO- Strength intact in bil LEs, subjective numbness to plantar aspect of left foot on exam MSK- no spinal, paraspinal or SI joint tenderness; no hip or lateral trochanteric tenderness to palpation. Bil LE ROM and strength intact; reproduction of left LE symptoms with palpation of left  piriformis and left LE flexion at hip SKIN- Warm, dry, No rash or lesion. PSYCH- Normal mood and affect, appropriate thought content and speech.  Assessment & Plan:   See Encounters Tab for problem based charting.   Patient discussed with Dr. Nilsa Nutting, MD Internal Medicine PGY2

## 2017-01-30 NOTE — Assessment & Plan Note (Addendum)
Patient with signs/symptoms consistent with sciatica related to piriformis syndrome. Though she does have significant arthropathy, central canal stenosis and evidence of previous nerve root impingement on imaging from 2011, she is currently denying back pain and has no tenderness to palpation of spine, SI joints. Pain is directly reproducible with palpation of piriformis muscle.  Plan: --refilled MAPAP 625mg  q6hrs PRN --refilled ibuprofen 400mg  TID PRN  --advised use of medicines only if stretching and heat do not provide enough relief --discussed use of heat, stretching exercises several times a day --if not enough advised we could do formal PT referral in future --she has an appt with ortho next week, with option for steroid injection if they feel appropriate and patient agreeable.  --has f/u with PCP in 1 month

## 2017-01-30 NOTE — Telephone Encounter (Signed)
I agree with refusing the #270 monthly supply of ibuprofen (already marked by [facility]). This is a temporary medicine and only as needed. Thank you!  Estill Dooms

## 2017-01-30 NOTE — Patient Instructions (Addendum)
I have refilled the ibuprofen and MAPAP; please take only if nothing else including heat is helping. Do the below stretches 2-3 times a day followed by use of a heating pad on your bottom. If you don't feel better with just these exercises we can consider a formal physical therapy referral. Keep the appointment with orthopedics as they may be able to do a local injection.   Piriformis Syndrome Rehab Ask your health care provider which exercises are safe for you. Do exercises exactly as told by your health care provider and adjust them as directed. It is normal to feel mild stretching, pulling, tightness, or discomfort as you do these exercises, but you should stop right away if you feel sudden pain or your pain gets worse.Do not begin these exercises until told by your health care provider. Stretching and range of motion exercises These exercises warm up your muscles and joints and improve the movement and flexibility of your hip and pelvis. These exercises also help to relieve pain, numbness, and tingling. Exercise A: Hip rotators  1. Lie on your back on a firm surface. 2. Pull your left / right knee toward your same shoulder with your left / right hand until your knee is pointing toward the ceiling. Hold your left / right ankle with your other hand. 3. Keeping your knee steady, gently pull your left / right ankle toward your other shoulder until you feel a stretch in your buttocks. 4. Hold this position for __________ seconds. Repeat __________ times. Complete this stretch __________ times a day. Exercise B: Hip extensors 1. Lie on your back on a firm surface. Both of your legs should be straight. 2. Pull your left / right knee to your chest. Hold your leg in this position by holding onto the back of your thigh or the front of your knee. 3. Hold this position for __________ seconds. 4. Slowly return to the starting position. Repeat __________ times. Complete this stretch __________ times a  day. Strengthening exercises These exercises build strength and endurance in your hip and thigh muscles. Endurance is the ability to use your muscles for a long time, even after they get tired. Exercise C: Straight leg raises ( hip abductors) 1. Lie on your side with your left / right leg in the top position. Lie so your head, shoulder, knee, and hip line up. Bend your bottom knee to help you balance. 2. Lift your top leg up 4-6 inches (10-15 cm), keeping your toes pointed straight ahead. 3. Hold this position for __________ seconds. 4. Slowly lower your leg to the starting position. Let your muscles relax completely. Repeat __________ times. Complete this exercise__________ times a day. Exercise D: Hip abductors and rotators, quadruped  1. Get on your hands and knees on a firm, lightly padded surface. Your hands should be directly below your shoulders, and your knees should be directly below your hips. 2. Lift your left / right knee out to the side. Keep your knee bent. Do not twist your body. 3. Hold this position for __________ seconds. 4. Slowly lower your leg. Repeat __________ times. Complete this exercise__________ times a day. Exercise E: Straight leg raises ( hip extensors) 1. Lie on your abdomen on a bed or a firm surface with a pillow under your hips. 2. Squeeze your buttock muscles and lift your left / right thigh off the bed. Do not let your back arch. 3. Hold this position for __________ seconds. 4. Slowly return to the starting position. Let  your muscles relax completely before doing another repetition. Repeat __________ times. Complete this exercise__________ times a day. This information is not intended to replace advice given to you by your health care provider. Make sure you discuss any questions you have with your health care provider. Document Released: 01/14/2005 Document Revised: 09/19/2015 Document Reviewed: 12/27/2014 Elsevier Interactive Patient Education  [employer].

## 2017-01-31 NOTE — Progress Notes (Signed)
Internal Medicine Clinic Attending  Case discussed with Dr. Svalina  at the time of the visit.  We reviewed the resident's history and exam and pertinent patient test results.  I agree with the assessment, diagnosis, and plan of care documented in the resident's note.  

## 2017-02-04 ENCOUNTER — Other Ambulatory Visit: Payer: Self-pay | Admitting: Internal Medicine

## 2017-02-04 DIAGNOSIS — M419 Scoliosis, unspecified: Secondary | ICD-10-CM | POA: Insufficient documentation

## 2017-02-04 DIAGNOSIS — M479 Spondylosis, unspecified: Secondary | ICD-10-CM | POA: Diagnosis not present

## 2017-02-04 DIAGNOSIS — M5136 Other intervertebral disc degeneration, lumbar region: Secondary | ICD-10-CM | POA: Diagnosis not present

## 2017-02-04 DIAGNOSIS — I10 Essential (primary) hypertension: Secondary | ICD-10-CM

## 2017-02-04 DIAGNOSIS — M51369 Other intervertebral disc degeneration, lumbar region without mention of lumbar back pain or lower extremity pain: Secondary | ICD-10-CM | POA: Insufficient documentation

## 2017-02-04 DIAGNOSIS — M47816 Spondylosis without myelopathy or radiculopathy, lumbar region: Secondary | ICD-10-CM | POA: Insufficient documentation

## 2017-02-04 DIAGNOSIS — M5416 Radiculopathy, lumbar region: Secondary | ICD-10-CM | POA: Diagnosis not present

## 2017-02-04 DIAGNOSIS — M545 Low back pain: Secondary | ICD-10-CM | POA: Diagnosis not present

## 2017-02-07 ENCOUNTER — Encounter (INDEPENDENT_AMBULATORY_CARE_PROVIDER_SITE_OTHER): Payer: Medicare Other | Admitting: Ophthalmology

## 2017-02-07 DIAGNOSIS — H43813 Vitreous degeneration, bilateral: Secondary | ICD-10-CM

## 2017-02-07 DIAGNOSIS — H35033 Hypertensive retinopathy, bilateral: Secondary | ICD-10-CM | POA: Diagnosis not present

## 2017-02-07 DIAGNOSIS — E113391 Type 2 diabetes mellitus with moderate nonproliferative diabetic retinopathy without macular edema, right eye: Secondary | ICD-10-CM

## 2017-02-07 DIAGNOSIS — I10 Essential (primary) hypertension: Secondary | ICD-10-CM | POA: Diagnosis not present

## 2017-02-07 DIAGNOSIS — E113312 Type 2 diabetes mellitus with moderate nonproliferative diabetic retinopathy with macular edema, left eye: Secondary | ICD-10-CM | POA: Diagnosis not present

## 2017-02-07 DIAGNOSIS — E11311 Type 2 diabetes mellitus with unspecified diabetic retinopathy with macular edema: Secondary | ICD-10-CM | POA: Diagnosis not present

## 2017-02-13 ENCOUNTER — Ambulatory Visit (INDEPENDENT_AMBULATORY_CARE_PROVIDER_SITE_OTHER): Payer: Medicare Other | Admitting: Dietician

## 2017-02-13 DIAGNOSIS — E1142 Type 2 diabetes mellitus with diabetic polyneuropathy: Secondary | ICD-10-CM

## 2017-02-13 DIAGNOSIS — Z794 Long term (current) use of insulin: Secondary | ICD-10-CM | POA: Diagnosis not present

## 2017-02-13 DIAGNOSIS — E119 Type 2 diabetes mellitus without complications: Secondary | ICD-10-CM | POA: Diagnosis not present

## 2017-02-13 DIAGNOSIS — Z713 Dietary counseling and surveillance: Secondary | ICD-10-CM

## 2017-02-14 ENCOUNTER — Encounter: Payer: Self-pay | Admitting: Dietician

## 2017-02-14 NOTE — Patient Instructions (Signed)
Thank you for coming to the diabetes meeting today.  See you in February 21.   Call our office if you need help getting here from the entrance  [place] [phone]

## 2017-02-14 NOTE — Progress Notes (Signed)
   l Medical Nutrition Therapy:  Appt start time: 4193 end time: 1115  Assessment:  Primary concerns today: blood sugar control. [name] attended diabetes education group visit today for 30 minutes. The program included successes and challenges over the past few weeks, goal setting and evaluation,and what can I eat food group activity emphasizing carbohydrates. .   WEIGHT:not done today BLOOD SUGARS: A1C 11.8% down from >14% in August.  Meter not downloaded today MEDICATIONS: levemir to 12 units,  Victoza 1.8 mg qam,  metformin twice daily, prandin  DIETARY INTAKE:Generally healthy intake, small portions. Her meal pattern is variable.     Progress Towards Goal(s):  In progress.   Nutritional Diagnosis:  [place]-2.2 Altered nutrition-related laboratory and [place]-2.4 Predicted food-medication interaction As related to lack of adequate blood sugar control is worsened,  still not at goal 7%  As evidenced by most recent A1C of 11.8%.     Intervention:  Nutrition education and support: diabetes meal planning.   Coordination of care- none  Handouts given during visit include: Meter , AVS Monitoring/Evaluation:  Dietary intake, exercise, meter, and body weight in 4 weeks(s) at group  [employer] , RD 02/14/2017 11:45 AM.  11:43 AM.

## 2017-02-16 DIAGNOSIS — Z961 Presence of intraocular lens: Secondary | ICD-10-CM | POA: Diagnosis not present

## 2017-02-16 DIAGNOSIS — H18832 Recurrent erosion of cornea, left eye: Secondary | ICD-10-CM | POA: Diagnosis not present

## 2017-02-17 ENCOUNTER — Other Ambulatory Visit: Payer: Self-pay | Admitting: Internal Medicine

## 2017-02-17 ENCOUNTER — Other Ambulatory Visit: Payer: Self-pay | Admitting: Urology

## 2017-02-17 DIAGNOSIS — H18832 Recurrent erosion of cornea, left eye: Secondary | ICD-10-CM | POA: Diagnosis not present

## 2017-02-17 DIAGNOSIS — N1330 Unspecified hydronephrosis: Secondary | ICD-10-CM

## 2017-02-17 DIAGNOSIS — Z961 Presence of intraocular lens: Secondary | ICD-10-CM | POA: Diagnosis not present

## 2017-02-18 ENCOUNTER — Encounter (INDEPENDENT_AMBULATORY_CARE_PROVIDER_SITE_OTHER): Payer: Medicare Other | Admitting: Ophthalmology

## 2017-02-18 DIAGNOSIS — E11319 Type 2 diabetes mellitus with unspecified diabetic retinopathy without macular edema: Secondary | ICD-10-CM | POA: Diagnosis not present

## 2017-02-18 DIAGNOSIS — E113591 Type 2 diabetes mellitus with proliferative diabetic retinopathy without macular edema, right eye: Secondary | ICD-10-CM | POA: Diagnosis not present

## 2017-02-21 DIAGNOSIS — Z961 Presence of intraocular lens: Secondary | ICD-10-CM | POA: Diagnosis not present

## 2017-02-21 DIAGNOSIS — H18832 Recurrent erosion of cornea, left eye: Secondary | ICD-10-CM | POA: Diagnosis not present

## 2017-02-25 ENCOUNTER — Telehealth: Payer: Self-pay | Admitting: Dietician

## 2017-03-04 ENCOUNTER — Ambulatory Visit (HOSPITAL_COMMUNITY)
Admission: RE | Admit: 2017-03-04 | Discharge: 2017-03-04 | Disposition: A | Discharge status: Home / Self Care | Payer: Medicare Other | Source: Ambulatory Visit | Attending: Urology | Admitting: Urology

## 2017-03-04 ENCOUNTER — Encounter (HOSPITAL_COMMUNITY): Payer: Self-pay

## 2017-03-06 ENCOUNTER — Ambulatory Visit: Payer: Medicare Other | Admitting: Physical Therapy

## 2017-03-07 ENCOUNTER — Encounter (INDEPENDENT_AMBULATORY_CARE_PROVIDER_SITE_OTHER): Payer: Medicare Other | Admitting: Ophthalmology

## 2017-03-11 ENCOUNTER — Other Ambulatory Visit: Payer: Self-pay | Admitting: Internal Medicine

## 2017-03-11 DIAGNOSIS — E039 Hypothyroidism, unspecified: Secondary | ICD-10-CM

## 2017-03-12 ENCOUNTER — Encounter (INDEPENDENT_AMBULATORY_CARE_PROVIDER_SITE_OTHER): Payer: Medicare Other | Admitting: Ophthalmology

## 2017-03-12 DIAGNOSIS — I10 Essential (primary) hypertension: Secondary | ICD-10-CM | POA: Diagnosis not present

## 2017-03-12 DIAGNOSIS — E113312 Type 2 diabetes mellitus with moderate nonproliferative diabetic retinopathy with macular edema, left eye: Secondary | ICD-10-CM | POA: Diagnosis not present

## 2017-03-12 DIAGNOSIS — E11311 Type 2 diabetes mellitus with unspecified diabetic retinopathy with macular edema: Secondary | ICD-10-CM

## 2017-03-12 DIAGNOSIS — H35033 Hypertensive retinopathy, bilateral: Secondary | ICD-10-CM | POA: Diagnosis not present

## 2017-03-12 DIAGNOSIS — E113591 Type 2 diabetes mellitus with proliferative diabetic retinopathy without macular edema, right eye: Secondary | ICD-10-CM | POA: Diagnosis not present

## 2017-03-12 DIAGNOSIS — H43813 Vitreous degeneration, bilateral: Secondary | ICD-10-CM

## 2017-03-12 LAB — HM DIABETES EYE EXAM

## 2017-03-13 ENCOUNTER — Encounter: Payer: Self-pay | Admitting: *Deleted

## 2017-03-14 ENCOUNTER — Encounter: Payer: Self-pay | Admitting: Internal Medicine

## 2017-03-14 ENCOUNTER — Ambulatory Visit (INDEPENDENT_AMBULATORY_CARE_PROVIDER_SITE_OTHER): Payer: Medicare Other | Admitting: Internal Medicine

## 2017-03-14 VITALS — BP 134/86 | HR 98 | Ht 62.5 in | Wt 130.0 lb

## 2017-03-14 DIAGNOSIS — E039 Hypothyroidism, unspecified: Secondary | ICD-10-CM

## 2017-03-14 DIAGNOSIS — Z794 Long term (current) use of insulin: Secondary | ICD-10-CM

## 2017-03-14 DIAGNOSIS — E1142 Type 2 diabetes mellitus with diabetic polyneuropathy: Secondary | ICD-10-CM

## 2017-03-14 LAB — POCT GLYCOSYLATED HEMOGLOBIN (HGB A1C): Hemoglobin A1C: 13

## 2017-03-14 MED ORDER — INSULIN LISPRO 100 UNIT/ML (KWIKPEN)
4.0000 [IU] | PEN_INJECTOR | Freq: Three times a day (TID) | SUBCUTANEOUS | 5 refills | Status: DC
Start: 2017-03-14 — End: 2017-04-29

## 2017-03-14 MED ORDER — LIRAGLUTIDE 18 MG/3ML ~~LOC~~ SOPN: PEN_INJECTOR | SUBCUTANEOUS | 5 refills | Status: DC

## 2017-03-14 NOTE — Patient Instructions (Addendum)
Please continue: - Metformin 1000 mg 2x a day, with meals - Victoza 1.8 mg in am before b'fast  - Levemir 14 units in a.m.  Stop: - Prandin  Start back: - Humalog: 4 units with a smaller meal 6 units before a larger meal  Please continue Levothyroxine 88 mcg daily.  Take the thyroid hormone every day, with water, at least 30 minutes before breakfast, separated by at least 4 hours from: - acid reflux medications - calcium - iron - multivitamins  Please return in 1.5 months with your sugar log.

## 2017-03-14 NOTE — Progress Notes (Signed)
Patient ID: [name], female   DOB: 1950-04-02, 67 y.o.   MRN: [id number]   HPI: [name] is a 67 y.o.-year-old female, returning for follow-up for DM2, dx in 1991-1992, insulin-dependent since 2000s, uncontrolled, with medication noncompliance;  + complications (DR, PN). Last visit 1.5 months ago.  Her daughter has a brain tumor >> she is in hospice.  Last hemoglobin A1c was: Lab Results  Component Value Date   HGBA1C 11.8 (H) 12/09/2016   HGBA1C >14.0 09/03/2016   HGBA1C 7.7 05/28/2016   Pt is on a regimen of: - Metformin 1000 mg 2x a day, with meals - Prandin 0.5 mg before each meal - Victoza 1.8 mg in am before b'fast  - Levemir 14 units in a.m.   Pt checks her sugars 1-2 times a day: - am: 75, 88, 282 >> 223-449, but after 01/12/2017: 80-174 >> 200s - 2h after b'fast: n/c >> 132 >> n/c - before lunch:80, 8180-239 >> 94-156, 171, 308 >> 151 >> n/c - 2h after lunch: 126, 214 >> 143, 315 >> 95, 189 >> 151 >> n/c - before dinner: 114-149, if no Humalog: 238, 250  >>93, 186 >> 500 x1 - 2h after dinner: 413 >> 107, 201 >> 163, 257 >> n/c - bedtime: 164 >> 108, 256 >> 220 >> n/c - nighttime:  67 x1 - Christmas Eve >> 61 x1 >> 80 >> n/c Lowest sugar was 61 >> 80 ; she has hypoglycemia awareness in the 80s..  Highest sugar was 301 >> 449 >> 500.  Glucometer: One Touch Verio  She continues to see the diabetes educator.  -No CKD, last BUN/creatinine:  Lab Results  Component Value Date   BUN 8 12/09/2016   BUN 18 12/06/2016   CREATININE 0.88 12/09/2016   CREATININE 0.80 12/06/2016   -+ HL; last set of lipids: Lab Results  Component Value Date   CHOL 183 05/28/2016   HDL 52 05/28/2016   LDLCALC 115 (H) 05/28/2016   TRIG 82 05/28/2016   CHOLHDL 3.5 05/28/2016  On Crestor. - last eye exam was in 02/2017: + DR .  Gets intraocular injections. Had Laser Sx.  - + numbness and tingling in her feet.  She also has hypothyroidism.  Latest TSH was normal: Lab Results   Component Value Date   TSH 4.100 12/09/2016   She is on levothyroxine 88 mcg daily  ROS: Constitutional: no weight gain/no weight loss, no fatigue, no subjective hyperthermia, no subjective hypothermia Eyes: no blurry vision, no xerophthalmia ENT: no sore throat, no nodules palpated in throat, no dysphagia, no odynophagia, no hoarseness Cardiovascular: no CP/no SOB/no palpitations/no leg swelling Respiratory: no cough/no SOB/no wheezing Gastrointestinal: no N/no V/no D/no C/no acid reflux Musculoskeletal: no muscle aches/no joint aches Skin: no rashes, no hair loss Neurological: no tremors/+ numbness/+ tingling/no dizziness  I reviewed pt's medications, allergies, PMH, social hx, family hx, and changes were documented in the history of present illness. Otherwise, unchanged from my initial visit note.  Past Medical History:  Diagnosis Date  . Arthritis   . Borderline glaucoma of both eyes   . Depression   . Diabetic polyneuropathy ([place])   . Diverticulosis of colon   . Dry eyes, bilateral   . Feeling of incomplete bladder emptying   . History of breast cancer oncologist-  dr Waymon Budge-- per lov note no recurrence   dx 04/ 1999 --- Stage 3B-- s/p  chemotherapy then right mastectomy then concurrent chemoradiation therapy  . History of urinary retention  01/ 2018  . Hydronephrosis of right kidney   . Hyperlipidemia   . Hypertension   . Hypothyroidism   . Insulin dependent type 2 diabetes mellitus [facility])    endocrinologist-  dr Cruzita Lederer---  last A1c 8.7 on 02-20-2016  . Lymphedema of upper extremity    right  . Macular degeneration, left eye    followed by dr Zigmund Daniel  . Renal insufficiency   . Urgency of urination    Past Surgical History:  Procedure Laterality Date  . CARPAL TUNNEL RELEASE Right 2017   and tendon repair  . CATARACT EXTRACTION W/ INTRAOCULAR LENS  IMPLANT, BILATERAL  2017  . CYSTO/  RIGHT RETROGRADE PYELOGRAM/  UNROOFING RIGHT URETEROCELE  09/18/2000   . CYSTOSCOPY/RETROGRADE/URETEROSCOPY Right 05/09/2016   Procedure: CYSTOSCOPY and right RETROGRADE;  Surgeon: Irine Seal, MD;  Location: [facility];  Service: Urology;  Laterality: Right;  . FINGER SURGERY Left x2 prior to 09-09-2014  . I&D EXTREMITY Left 09/09/2014   Procedure: IRRIGATION AND DEBRIDEMENT LEFT THUMB DISTAL PHALANX;  Surgeon: Daryll Brod, MD;  Location: [facility];  Service: Orthopedics;  Laterality: Left;  [name] MASTECTOMY Right 1999   w/  Node dissection's  . PORT-A-CATH REMOVAL  05/01/1999  . TUBAL LIGATION    . VAGINAL HYSTERECTOMY  1970's  . WRIST GANGLION EXCISION Right 2016   Social History   Social History  . Marital status: Widow     Spouse name: N/A  . Number of children: 4   Occupational History  . Retired   Social History Main Topics  . Smoking status: Smoker  . Smokeless tobacco: Never Used  . Alcohol use yes  . Drug use: yes   Current Outpatient Medications on File Prior to Visit  Medication Sig Dispense Refill  . acetaminophen (MAPAP) 325 MG tablet Take 2 tablets (650 mg total) by mouth every 6 (six) hours as needed for moderate pain (Do not take more than 8 tablets per day). 90 tablet 0  . amLODipine (NORVASC) 10 MG tablet TAKE 1 TABLET(10 MG) BY MOUTH DAILY 90 tablet 1  . aspirin EC 81 MG tablet Take 81 mg by mouth at bedtime.    . bacitracin-polymyxin b (POLYSPORIN) ophthalmic ointment Place 4 (four) times daily into the right eye. Place a 1/2 inch ribbon of ointment into the lower eyelid. 3.5 g 0  . calcium citrate-vitamin D (CITRACAL+D) 315-200 MG-UNIT tablet Take 1 tablet by mouth 2 (two) times daily. 100 tablet 3  . Cholecalciferol (VITAMIN D3) 2000 units TABS Take 2,000 Units at bedtime by mouth.     . cycloSPORINE (RESTASIS) 0.05 % ophthalmic emulsion Place 1 drop daily into both eyes.     [name] glucose 4 GM chewable tablet Chew 4 tablets (16 g total) by mouth as needed for low blood sugar. 50 tablet 12  . glucose  blood (ONETOUCH VERIO) test strip USE TO CHECK BLOOD SUGAR TWICE DAILY 200 each 3  . hydrochlorothiazide (MICROZIDE) 12.5 MG capsule TAKE 1 CAPSULE BY MOUTH EVERY DAY (Patient taking differently: TAKE 1 CAPSULE (12.5 MG) BY MOUTH EVERY DAY AT BEDTIME) 90 capsule 3  . ibuprofen (ADVIL,MOTRIN) 400 MG tablet Take 1 tablet (400 mg total) by mouth every 8 (eight) hours as needed for moderate pain. 30 tablet 0  . Insulin Detemir (LEVEMIR FLEXTOUCH) 100 UNIT/ML Pen Inject 14 Units into the skin daily before breakfast.    . Insulin Pen Needle (BD PEN NEEDLE NANO U/F) 32G X 4 MM MISC Use to  check blood sugar every night at bedtime. Diagnosis code E11.42, Z79.4 100 each 0  . levothyroxine (SYNTHROID, LEVOTHROID) 88 MCG tablet TAKE 1 TABLET BY MOUTH EVERY MORNING BEFORE BREAKFAST 90 tablet 0  . magnesium oxide (MAG-OX) 400 MG tablet Take 1 tablet (400 mg total) 2 (two) times daily by mouth. 14 tablet 0  . metFORMIN (GLUCOPHAGE) 1000 MG tablet TAKE 1 TABLET(1000 MG) BY MOUTH TWICE DAILY WITH A MEAL (Patient taking differently: Take 1,000 mg 2 (two) times daily with a meal by mouth. ) 180 tablet 3  . Olopatadine HCl (PATADAY) 0.2 % SOLN Place 1 drop daily into both eyes.     Glory Rosebush DELICA LANCETS FINE MISC USE TO CHECK BLOOD SUGAR TWICE DAILY 200 each 3  . Polyethyl Glycol-Propyl Glycol (SYSTANE) 0.4-0.3 % GEL ophthalmic gel Place 1 application daily as needed into both eyes (dry eyes/irritation).     . polyethylene glycol (MIRALAX / GLYCOLAX) packet Take 17 g daily as needed by mouth (constipation). Mix in 8 oz liquid and drink    . potassium chloride 20 MEQ/15ML (10%) SOLN Take 15 mLs (20 mEq total) by mouth daily as needed (for muscle cramps). 240 mL 0  . potassium chloride SA (K-DUR,KLOR-CON) 20 MEQ tablet Take 1 tablet (20 mEq total) 2 (two) times daily for 3 days by mouth. 6 tablet 0  . pregabalin (LYRICA) 50 MG capsule Take 1 capsule (50 mg total) by mouth 3 (three) times daily. 90 capsule 2  .  repaglinide (PRANDIN) 0.5 MG tablet Take 1 tablet (0.5 mg total) by mouth 3 (three) times daily before meals. 270 tablet 3  . rosuvastatin (CRESTOR) 20 MG tablet TAKE 1 TABLET(20 MG) BY MOUTH DAILY 90 tablet 0  . venlafaxine XR (EFFEXOR-XR) 150 MG 24 hr capsule TAKE 1 CAPSULE(150 MG) BY MOUTH DAILY WITH BREAKFAST 90 capsule 0  . VICTOZA 18 MG/3ML SOPN ADMINISTER 1.8 MG UNDER THE SKIN DAILY 9 mL 0   No current facility-administered medications on file prior to visit.    Allergies  Allergen Reactions  . Gabapentin Other (See Comments)    Dizziness from 300 mg, tolerates 100 mg  . Penicillins Rash    Has patient had a PCN reaction causing immediate rash, facial/tongue/throat swelling, SOB or lightheadedness with hypotension: Yes Has patient had a PCN reaction causing severe rash involving mucus membranes or skin necrosis: No Has patient had a PCN reaction that required hospitalization: pt was in hospital at time of reaction Has patient had a PCN reaction occurring within the last 10 years: No If all of the above answers are "NO", then may proceed with Cephalosporin use.  . Sulfa Antibiotics Rash   Family History  Problem Relation Age of Onset  . Heart disease Father   . Diabetes Father   . Stroke Sister   . Anuerysm Sister 40       brain; maternal half-sister  . Heart disease Brother   . Anuerysm Brother 22       aortic; maternal half-brother  . Breast cancer Daughter 54       negative genetic testing in 2015  . Heart attack Daughter        38-47  . Diabetes Paternal Grandmother   . Stroke Paternal Grandfather   . Anuerysm Brother        NOS type; full brother  . Fibroids Daughter        s/p hysterectomy at 65y  . Brain cancer Maternal Uncle  dx. older than 33; NOS type  . Brain cancer Cousin        maternal 1st cousin; d. early 58s; NOS type  . Deafness Paternal Uncle        prelingual   PE: BP 134/86 (BP Location: Left Arm, Patient Position: Sitting, Cuff Size:  Normal)   Pulse 98   Ht 5' 2.5" (1.588 m)   Wt 130 lb (59 kg)   BMI 23.40 kg/m  Wt Readings from Last 3 Encounters:  03/14/17 130 lb (59 kg)  01/30/17 128 lb (58.1 kg)  01/23/17 126 lb 6.4 oz (57.3 kg)   Constitutional: Normal weight, in NAD Eyes: PERRLA, EOMI, no exophthalmos ENT: moist mucous membranes, no thyromegaly, no cervical lymphadenopathy Cardiovascular: tachycardia RR, No MRG Respiratory: CTA B Gastrointestinal: abdomen soft, NT, ND, BS+ Musculoskeletal: no deformities, strength intact in all 4 Skin: moist, warm, no rashes Neurological: no tremor with outstretched hands, DTR normal in all 4  ASSESSMENT: 1. DM2, insulin-dependent, uncontrolled, with complications - DR - PN  2. Hypothyroidism  PLAN:  1. Patient with long-standing, uncontrolled, diabetes, on insulin, GLP-1 receptor agonist, metformin, with very high sugars at last visit and an HbA1c of 11.8%.  At that time, she was off her Humalog and was taking Prandin as prescribed by PCP.  She was only taking this before dinner.  We increased this to be taken before every meal.  However, we decided to have her back in a month and a half to see if Prandin is enough or we need to restart mealtime insulin. - at today's visit, sugars are very high (she did not check many times) - we will need to restart Humalog and stop Prandin - I suggested to:  Patient Instructions  Please continue: - Metformin 1000 mg 2x a day, with meals - Victoza 1.8 mg in am before b'fast  - Levemir 14 units in a.m.  Stop: - Prandin  Start back: - Humalog   Please return in 3 months with your sugar log.   - today, HbA1c is 13% (higher) - continue checking sugars at different times of the day - check 3x a day, rotating checks - advised for yearly eye exams >> she is UTD - Return to clinic in 3 mo with sugar log   2. Hypothyroidism - latest thyroid labs reviewed with pt >> normal  - she continues on LT4 88 mcg daily - pt feels good on  this dose. - we discussed about taking the thyroid hormone every day, with water, >30 minutes before breakfast, separated by >4 hours from acid reflux medications, calcium, iron, multivitamins. Pt. is taking it correctly, except taking MVI close to LT4 >> will move this 4h later   Philemon Kingdom, MD PhD [facility] Endocrinology

## 2017-03-18 ENCOUNTER — Other Ambulatory Visit: Payer: Self-pay | Admitting: Internal Medicine

## 2017-03-18 ENCOUNTER — Ambulatory Visit: Payer: Medicare Other | Attending: Orthopedic Surgery | Admitting: Physical Therapy

## 2017-03-20 ENCOUNTER — Ambulatory Visit (INDEPENDENT_AMBULATORY_CARE_PROVIDER_SITE_OTHER): Payer: Medicare Other | Admitting: Dietician

## 2017-03-20 DIAGNOSIS — Z794 Long term (current) use of insulin: Secondary | ICD-10-CM

## 2017-03-20 DIAGNOSIS — E119 Type 2 diabetes mellitus without complications: Secondary | ICD-10-CM

## 2017-03-20 DIAGNOSIS — Z713 Dietary counseling and surveillance: Secondary | ICD-10-CM | POA: Diagnosis not present

## 2017-03-20 DIAGNOSIS — E1142 Type 2 diabetes mellitus with diabetic polyneuropathy: Secondary | ICD-10-CM

## 2017-03-21 ENCOUNTER — Encounter: Payer: Self-pay | Admitting: Dietician

## 2017-03-21 NOTE — Progress Notes (Signed)
l Medical Nutrition Therapy:  Appt start time: 2878 end time: 1115  Assessment:  Primary concerns today: blood sugar control. [name] attended diabetes education group visit today for 60 minutes.  Patient attended diabetes education group visit today for 60 minutes. The program included successes and challenges over the past few weeks, and how to prevent and care for long tern complications.  WEIGHT:not done today BLOOD SUGARS: A1C 14%-11.8%- 13% this week MEDICATIONS:  Current Outpatient Medications on File Prior to Visit  Medication Sig Dispense Refill  . acetaminophen (MAPAP) 325 MG tablet Take 2 tablets (650 mg total) by mouth every 6 (six) hours as needed for moderate pain (Do not take more than 8 tablets per day). 90 tablet 0  . amLODipine (NORVASC) 10 MG tablet TAKE 1 TABLET(10 MG) BY MOUTH DAILY 90 tablet 1  . aspirin EC 81 MG tablet Take 81 mg by mouth at bedtime.    . bacitracin-polymyxin b (POLYSPORIN) ophthalmic ointment Place 4 (four) times daily into the right eye. Place a 1/2 inch ribbon of ointment into the lower eyelid. 3.5 g 0  . calcium citrate-vitamin D (CITRACAL+D) 315-200 MG-UNIT tablet Take 1 tablet by mouth 2 (two) times daily. 100 tablet 3  . Cholecalciferol (VITAMIN D3) 2000 units TABS Take 2,000 Units at bedtime by mouth.     . cycloSPORINE (RESTASIS) 0.05 % ophthalmic emulsion Place 1 drop daily into both eyes.     [name] glucose 4 GM chewable tablet Chew 4 tablets (16 g total) by mouth as needed for low blood sugar. 50 tablet 12  . glucose blood (ONETOUCH VERIO) test strip USE TO CHECK BLOOD SUGAR TWICE DAILY 200 each 3  . hydrochlorothiazide (MICROZIDE) 12.5 MG capsule TAKE 1 CAPSULE BY MOUTH EVERY DAY (Patient taking differently: TAKE 1 CAPSULE (12.5 MG) BY MOUTH EVERY DAY AT BEDTIME) 90 capsule 3  . ibuprofen (ADVIL,MOTRIN) 400 MG tablet Take 1 tablet (400 mg total) by mouth every 8 (eight) hours as needed for moderate pain. 30 tablet 0  . Insulin Detemir (LEVEMIR  FLEXTOUCH) 100 UNIT/ML Pen Inject 14 Units into the skin daily before breakfast.    . insulin lispro (HUMALOG KWIKPEN) 100 UNIT/ML KiwkPen Inject 0.04-0.06 mLs (4-6 Units total) into the skin 3 (three) times daily. 15 mL 5  . Insulin Pen Needle (BD PEN NEEDLE NANO U/F) 32G X 4 MM MISC USE AS DIRECTED 100 each 0  . levothyroxine (SYNTHROID, LEVOTHROID) 88 MCG tablet TAKE 1 TABLET BY MOUTH EVERY MORNING BEFORE BREAKFAST 90 tablet 0  . liraglutide (VICTOZA) 18 MG/3ML SOPN ADMINISTER 1.8 MG UNDER THE SKIN DAILY 9 mL 5  . magnesium oxide (MAG-OX) 400 MG tablet Take 1 tablet (400 mg total) 2 (two) times daily by mouth. 14 tablet 0  . metFORMIN (GLUCOPHAGE) 1000 MG tablet TAKE 1 TABLET(1000 MG) BY MOUTH TWICE DAILY WITH A MEAL (Patient taking differently: Take 1,000 mg 2 (two) times daily with a meal by mouth. ) 180 tablet 3  . Olopatadine HCl (PATADAY) 0.2 % SOLN Place 1 drop daily into both eyes.     Glory Rosebush DELICA LANCETS FINE MISC USE TO CHECK BLOOD SUGAR TWICE DAILY 200 each 3  . Polyethyl Glycol-Propyl Glycol (SYSTANE) 0.4-0.3 % GEL ophthalmic gel Place 1 application daily as needed into both eyes (dry eyes/irritation).     . polyethylene glycol (MIRALAX / GLYCOLAX) packet Take 17 g daily as needed by mouth (constipation). Mix in 8 oz liquid and drink    . potassium chloride  20 MEQ/15ML (10%) SOLN Take 15 mLs (20 mEq total) by mouth daily as needed (for muscle cramps). 240 mL 0  . potassium chloride SA (K-DUR,KLOR-CON) 20 MEQ tablet Take 1 tablet (20 mEq total) 2 (two) times daily for 3 days by mouth. 6 tablet 0  . pregabalin (LYRICA) 50 MG capsule Take 1 capsule (50 mg total) by mouth 3 (three) times daily. 90 capsule 2  . rosuvastatin (CRESTOR) 20 MG tablet TAKE 1 TABLET(20 MG) BY MOUTH DAILY 90 tablet 0  . venlafaxine XR (EFFEXOR-XR) 150 MG 24 hr capsule TAKE 1 CAPSULE(150 MG) BY MOUTH DAILY WITH BREAKFAST 90 capsule 0   No current facility-administered medications on file prior to visit.      DIETARY INTAKE:Generally healthy intake, small portions. Her meal pattern is variable.     Progress Towards Goal(s):  In progress.   Nutritional Diagnosis:  [place]-2.2 Altered nutrition-related laboratory and [place]-2.4 Predicted food-medication interaction As related to lack of adequate blood sugar control is worsened,  still not at goal 7%  As evidenced by most recent A1C.     Intervention:  Nutrition education and support: diabetes meal planning.   Coordination of care- none  Handouts given during visit include: Meter , AVS Monitoring/Evaluation:  Dietary intake, exercise, meter, and body weight in 4 weeks(s) at group  [employer] , RD 03/21/2017 3:56 PM.  3:56 PM.

## 2017-04-01 ENCOUNTER — Emergency Department (HOSPITAL_COMMUNITY): Payer: Medicare Other

## 2017-04-01 ENCOUNTER — Encounter: Payer: Self-pay | Admitting: Internal Medicine

## 2017-04-01 ENCOUNTER — Emergency Department (HOSPITAL_COMMUNITY)
Admission: EM | Admit: 2017-04-01 | Discharge: 2017-04-02 | Disposition: A | Discharge status: Home / Self Care | Payer: Medicare Other | Attending: Emergency Medicine | Admitting: Emergency Medicine

## 2017-04-01 ENCOUNTER — Encounter (HOSPITAL_COMMUNITY): Payer: Self-pay | Admitting: Pharmacy Technician

## 2017-04-01 ENCOUNTER — Other Ambulatory Visit: Payer: Self-pay

## 2017-04-01 ENCOUNTER — Ambulatory Visit (INDEPENDENT_AMBULATORY_CARE_PROVIDER_SITE_OTHER): Payer: Medicare Other | Admitting: Internal Medicine

## 2017-04-01 VITALS — BP 140/84 | HR 112 | Temp 98.0°F | Ht 62.5 in | Wt 127.8 lb

## 2017-04-01 DIAGNOSIS — E039 Hypothyroidism, unspecified: Secondary | ICD-10-CM

## 2017-04-01 DIAGNOSIS — I10 Essential (primary) hypertension: Secondary | ICD-10-CM | POA: Insufficient documentation

## 2017-04-01 DIAGNOSIS — J3489 Other specified disorders of nose and nasal sinuses: Secondary | ICD-10-CM

## 2017-04-01 DIAGNOSIS — Z794 Long term (current) use of insulin: Secondary | ICD-10-CM

## 2017-04-01 DIAGNOSIS — E785 Hyperlipidemia, unspecified: Secondary | ICD-10-CM

## 2017-04-01 DIAGNOSIS — Z79899 Other long term (current) drug therapy: Secondary | ICD-10-CM

## 2017-04-01 DIAGNOSIS — Z79890 Hormone replacement therapy (postmenopausal): Secondary | ICD-10-CM

## 2017-04-01 DIAGNOSIS — S0990XA Unspecified injury of head, initial encounter: Secondary | ICD-10-CM | POA: Diagnosis not present

## 2017-04-01 DIAGNOSIS — Z7982 Long term (current) use of aspirin: Secondary | ICD-10-CM | POA: Insufficient documentation

## 2017-04-01 DIAGNOSIS — N1330 Unspecified hydronephrosis: Secondary | ICD-10-CM | POA: Diagnosis not present

## 2017-04-01 DIAGNOSIS — E1142 Type 2 diabetes mellitus with diabetic polyneuropathy: Secondary | ICD-10-CM | POA: Diagnosis not present

## 2017-04-01 DIAGNOSIS — M5442 Lumbago with sciatica, left side: Secondary | ICD-10-CM | POA: Diagnosis not present

## 2017-04-01 DIAGNOSIS — F339 Major depressive disorder, recurrent, unspecified: Secondary | ICD-10-CM | POA: Diagnosis not present

## 2017-04-01 DIAGNOSIS — Y999 Unspecified external cause status: Secondary | ICD-10-CM | POA: Insufficient documentation

## 2017-04-01 DIAGNOSIS — G629 Polyneuropathy, unspecified: Secondary | ICD-10-CM

## 2017-04-01 DIAGNOSIS — Y939 Activity, unspecified: Secondary | ICD-10-CM | POA: Diagnosis not present

## 2017-04-01 DIAGNOSIS — Y929 Unspecified place or not applicable: Secondary | ICD-10-CM | POA: Diagnosis not present

## 2017-04-01 DIAGNOSIS — I129 Hypertensive chronic kidney disease with stage 1 through stage 4 chronic kidney disease, or unspecified chronic kidney disease: Secondary | ICD-10-CM

## 2017-04-01 DIAGNOSIS — N189 Chronic kidney disease, unspecified: Secondary | ICD-10-CM | POA: Diagnosis not present

## 2017-04-01 DIAGNOSIS — E119 Type 2 diabetes mellitus without complications: Secondary | ICD-10-CM | POA: Insufficient documentation

## 2017-04-01 DIAGNOSIS — W010XXA Fall on same level from slipping, tripping and stumbling without subsequent striking against object, initial encounter: Secondary | ICD-10-CM | POA: Diagnosis not present

## 2017-04-01 DIAGNOSIS — R51 Headache: Secondary | ICD-10-CM | POA: Diagnosis not present

## 2017-04-01 DIAGNOSIS — S199XXA Unspecified injury of neck, initial encounter: Secondary | ICD-10-CM | POA: Diagnosis not present

## 2017-04-01 DIAGNOSIS — N183 Chronic kidney disease, stage 3 unspecified: Secondary | ICD-10-CM

## 2017-04-01 DIAGNOSIS — F32A Depression, unspecified: Secondary | ICD-10-CM

## 2017-04-01 DIAGNOSIS — M48061 Spinal stenosis, lumbar region without neurogenic claudication: Secondary | ICD-10-CM

## 2017-04-01 DIAGNOSIS — R200 Anesthesia of skin: Secondary | ICD-10-CM | POA: Diagnosis not present

## 2017-04-01 DIAGNOSIS — S32009A Unspecified fracture of unspecified lumbar vertebra, initial encounter for closed fracture: Secondary | ICD-10-CM | POA: Diagnosis not present

## 2017-04-01 DIAGNOSIS — M5441 Lumbago with sciatica, right side: Secondary | ICD-10-CM

## 2017-04-01 DIAGNOSIS — S32058A Other fracture of fifth lumbar vertebra, initial encounter for closed fracture: Secondary | ICD-10-CM | POA: Insufficient documentation

## 2017-04-01 DIAGNOSIS — E1122 Type 2 diabetes mellitus with diabetic chronic kidney disease: Secondary | ICD-10-CM

## 2017-04-01 DIAGNOSIS — F329 Major depressive disorder, single episode, unspecified: Secondary | ICD-10-CM

## 2017-04-01 DIAGNOSIS — S34109A Unspecified injury to unspecified level of lumbar spinal cord, initial encounter: Secondary | ICD-10-CM | POA: Diagnosis present

## 2017-04-01 DIAGNOSIS — W19XXXA Unspecified fall, initial encounter: Secondary | ICD-10-CM

## 2017-04-01 DIAGNOSIS — G4489 Other headache syndrome: Secondary | ICD-10-CM | POA: Diagnosis not present

## 2017-04-01 HISTORY — DX: Unspecified hydronephrosis: N13.30

## 2017-04-01 LAB — COMPREHENSIVE METABOLIC PANEL
ALT: 14 U/L (ref 14–54)
AST: 24 U/L (ref 15–41)
Albumin: 3.8 g/dL (ref 3.5–5.0)
Alkaline Phosphatase: 86 U/L (ref 38–126)
Anion gap: 11 (ref 5–15)
BUN: 12 mg/dL (ref 6–20)
CO2: 24 mmol/L (ref 22–32)
Calcium: 9.4 mg/dL (ref 8.9–10.3)
Chloride: 97 mmol/L — ABNORMAL LOW (ref 101–111)
Creatinine, Ser: 1.03 mg/dL — ABNORMAL HIGH (ref 0.44–1.00)
GFR calc Af Amer: >60 mL/min (ref >60)
GFR calc non Af Amer: 55 mL/min — ABNORMAL LOW (ref >60)
Glucose, Bld: 468 mg/dL — ABNORMAL HIGH (ref 65–99)
Potassium: 3.8 mmol/L (ref 3.5–5.1)
Sodium: 132 mmol/L — ABNORMAL LOW (ref 135–145)
Total Bilirubin: 0.9 mg/dL (ref 0.3–1.2)
Total Protein: 7.3 g/dL (ref 6.5–8.1)

## 2017-04-01 LAB — CBC WITH DIFFERENTIAL/PLATELET
Basophils Absolute: 0 10*3/uL (ref 0.0–0.1)
Basophils Relative: 0 %
Eosinophils Absolute: 0 10*3/uL (ref 0.0–0.7)
Eosinophils Relative: 0 %
HCT: 34.6 % — ABNORMAL LOW (ref 36.0–46.0)
Hemoglobin: 11.9 g/dL — ABNORMAL LOW (ref 12.0–15.0)
Lymphocytes Relative: 21 %
Lymphs Abs: 2.1 10*3/uL (ref 0.7–4.0)
MCH: 30.8 pg (ref 26.0–34.0)
MCHC: 34.4 g/dL (ref 30.0–36.0)
MCV: 89.6 fL (ref 78.0–100.0)
Monocytes Absolute: 0.6 10*3/uL (ref 0.1–1.0)
Monocytes Relative: 6 %
Neutro Abs: 7.1 10*3/uL (ref 1.7–7.7)
Neutrophils Relative %: 73 %
Platelets: 284 10*3/uL (ref 150–400)
RBC: 3.86 MIL/uL — ABNORMAL LOW (ref 3.87–5.11)
RDW: 11.9 % (ref 11.5–15.5)
WBC: 9.9 10*3/uL (ref 4.0–10.5)

## 2017-04-01 LAB — GLUCOSE, CAPILLARY: Glucose-Capillary: 395 mg/dL — ABNORMAL HIGH (ref 65–99)

## 2017-04-01 LAB — PROTIME-INR
INR: 1.04
Prothrombin Time: 13.5 seconds (ref 11.4–15.2)

## 2017-04-01 MED ORDER — TRAMADOL HCL 50 MG PO TABS
50.0000 mg | ORAL_TABLET | Freq: Once | ORAL | Status: AC
  Administered 2017-04-01: 50 mg via ORAL
  Filled 2017-04-01: qty 1

## 2017-04-01 MED ORDER — PREGABALIN 50 MG PO CAPS: 50.0000 mg | ORAL_CAPSULE | Freq: Three times a day (TID) | ORAL | 2 refills | Status: DC

## 2017-04-01 MED ORDER — LORAZEPAM 2 MG/ML IJ SOLN
1.0000 mg | Freq: Once | INTRAMUSCULAR | Status: AC
  Administered 2017-04-02: 1 mg via INTRAVENOUS
  Filled 2017-04-01: qty 1

## 2017-04-01 NOTE — Assessment & Plan Note (Signed)
-  This problem is chronic and stable -Patient denies any urinary complaints at this time -We will check a BMP today 

## 2017-04-01 NOTE — ED Triage Notes (Signed)
Pt arrives via [facility] with reports of falling. Pt was attempting to keep her grandchild from falling and in turn, fell onto her head at [employer]. Pt with hematoma to L forehead. Post fall pt reports double vision, abnormal sensation to BLE, weakness to BLE. Pt denies blood thinners. PERRLA intact. No facial droop noted. Pt in soft collar on arrival. BP 196/90, HR 108, RR 22, 99% 2L [place], CBG reading High.

## 2017-04-01 NOTE — ED Notes (Signed)
ED Provider at bedside. 

## 2017-04-01 NOTE — Assessment & Plan Note (Signed)
-  This problem is chronic and stable -Patient states that this is not been well controlled but I suspect that this is likely secondary to a number of life stressors that she has currently given both her children are sick (one daughter had a below-knee amputation and the other was diagnosed with cancer) -We will continue with Effexor for now -If her symptoms persist we will consider referring her to a therapist

## 2017-04-01 NOTE — ED Provider Notes (Signed)
[facility] EMERGENCY DEPARTMENT Provider Note   CSN: [id number] Arrival date & time: 04/01/17  1724     History   Chief Complaint Chief Complaint  Patient presents with  . Fall    HPI [name] is a 67 y.o. female.  The history is provided by the patient, a relative and medical records. No language interpreter was used.  Fall  This is a new problem. The current episode started less than 1 hour ago. The problem occurs constantly. The problem has not changed since onset.Associated symptoms include headaches. Pertinent negatives include no chest pain, no abdominal pain and no shortness of breath. The symptoms are aggravated by twisting. Nothing relieves the symptoms. She has tried nothing for the symptoms. The treatment provided no relief.  Back Pain   This is a new problem. The current episode started less than 1 hour ago. The problem occurs constantly. The problem has not changed since onset.The pain is associated with falling. The pain is present in the lumbar spine. The quality of the pain is described as burning and shooting. The pain is moderate. The symptoms are aggravated by bending, twisting and certain positions. Associated symptoms include headaches and tingling. Pertinent negatives include no chest pain, no fever, no numbness, no abdominal pain, no abdominal swelling, no bowel incontinence, no bladder incontinence, no dysuria, no leg pain and no weakness. She has tried nothing for the symptoms. The treatment provided no relief.    Past Medical History:  Diagnosis Date  . Arthritis   . Borderline glaucoma of both eyes   . Depression   . Diabetic polyneuropathy ([place])   . Diverticulosis of colon   . Dry eyes, bilateral   . Feeling of incomplete bladder emptying   . History of breast cancer oncologist-  dr Waymon Budge-- per lov note no recurrence   dx 04/ 1999 --- Stage 3B-- s/p  chemotherapy then right mastectomy then concurrent chemoradiation therapy    . History of urinary retention    01/ 2018  . Hydronephrosis of right kidney   . Hyperlipidemia   . Hypertension   . Hypothyroidism   . Insulin dependent type 2 diabetes mellitus [facility])    endocrinologist-  dr Cruzita Lederer---  last A1c 8.7 on 02-20-2016  . Lymphedema of upper extremity    right  . Macular degeneration, left eye    followed by dr Zigmund Daniel  . Renal insufficiency   . Urgency of urination     Patient Active Problem List   Diagnosis Date Noted  . Hydronephrosis 04/01/2017  . Sciatica of left side without back pain 01/30/2017  . Neuropathy 12/09/2016  . Hypokalemia 12/09/2016  . Hypomagnesemia 12/09/2016  . Genetic testing 11/27/2015  . Bilateral hip pain 02/24/2015  . Preventative health care 08/26/2012  . HTN, goal below 140/90 08/13/2012  . Chronic kidney disease 08/13/2012  . Glaucoma associated with systemic syndromes(365.44) 08/07/2012  . Hypothyroidism 04/16/2006  . Anemia 04/16/2006  . Depression 04/16/2006  . LOW BACK PAIN 04/16/2006  . BREAST CANCER, HX OF 04/16/2006  . Hyperlipidemia 10/13/2003  . Type 2 diabetes mellitus with diabetic polyneuropathy, with long-term current use of insulin ([place]) 04/16/1991    Past Surgical History:  Procedure Laterality Date  . CARPAL TUNNEL RELEASE Right 2017   and tendon repair  . CATARACT EXTRACTION W/ INTRAOCULAR LENS  IMPLANT, BILATERAL  2017  . CYSTO/  RIGHT RETROGRADE PYELOGRAM/  UNROOFING RIGHT URETEROCELE  09/18/2000  . CYSTOSCOPY/RETROGRADE/URETEROSCOPY Right 05/09/2016   Procedure: CYSTOSCOPY and  right RETROGRADE;  Surgeon: Irine Seal, MD;  Location: [facility];  Service: Urology;  Laterality: Right;  . FINGER SURGERY Left x2 prior to 09-09-2014  . I&D EXTREMITY Left 09/09/2014   Procedure: IRRIGATION AND DEBRIDEMENT LEFT THUMB DISTAL PHALANX;  Surgeon: Daryll Brod, MD;  Location: [facility];  Service: Orthopedics;  Laterality: Left;  [name] MASTECTOMY Right 1999   w/  Node  dissection's  . PORT-A-CATH REMOVAL  05/01/1999  . TUBAL LIGATION    . VAGINAL HYSTERECTOMY  1970's  . WRIST GANGLION EXCISION Right 2016    OB History    No data available       Home Medications    Prior to Admission medications   Medication Sig Start Date End Date Taking? Authorizing Provider  acetaminophen (MAPAP) 325 MG tablet Take 2 tablets (650 mg total) by mouth every 6 (six) hours as needed for moderate pain (Do not take more than 8 tablets per day). 01/30/17   Alphonzo Grieve, MD  amLODipine (NORVASC) 10 MG tablet TAKE 1 TABLET(10 MG) BY MOUTH DAILY 02/05/17   Aldine Contes, MD  aspirin EC 81 MG tablet Take 81 mg by mouth at bedtime.    [provider]  calcium citrate-vitamin D (CITRACAL+D) 315-200 MG-UNIT tablet Take 1 tablet by mouth 2 (two) times daily. 06/06/16 06/06/17  Aldine Contes, MD  Cholecalciferol (VITAMIN D3) 2000 units TABS Take 2,000 Units at bedtime by mouth.     [provider]  cycloSPORINE (RESTASIS) 0.05 % ophthalmic emulsion Place 1 drop daily into both eyes.     [provider]  glucose 4 GM chewable tablet Chew 4 tablets (16 g total) by mouth as needed for low blood sugar. 05/28/12   Dominic Pea, DO  glucose blood (ONETOUCH VERIO) test strip USE TO CHECK BLOOD SUGAR TWICE DAILY 09/10/16   Aldine Contes, MD  hydrochlorothiazide (MICROZIDE) 12.5 MG capsule TAKE 1 CAPSULE BY MOUTH EVERY DAY Patient taking differently: TAKE 1 CAPSULE (12.5 MG) BY MOUTH EVERY DAY AT BEDTIME 05/21/16   Aldine Contes, MD  Insulin Detemir (LEVEMIR FLEXTOUCH) 100 UNIT/ML Pen Inject 14 Units into the skin daily before breakfast. 01/23/17   Philemon Kingdom, MD  insulin lispro (HUMALOG KWIKPEN) 100 UNIT/ML KiwkPen Inject 0.04-0.06 mLs (4-6 Units total) into the skin 3 (three) times daily. 03/14/17   Philemon Kingdom, MD  Insulin Pen Needle (BD PEN NEEDLE NANO U/F) 32G X 4 MM MISC USE AS DIRECTED 03/19/17   Aldine Contes, MD  levothyroxine  (SYNTHROID, LEVOTHROID) 88 MCG tablet TAKE 1 TABLET BY MOUTH EVERY MORNING BEFORE BREAKFAST 03/11/17   Aldine Contes, MD  liraglutide (VICTOZA) 18 MG/3ML SOPN ADMINISTER 1.8 MG UNDER THE SKIN DAILY 03/14/17   Philemon Kingdom, MD  metFORMIN (GLUCOPHAGE) 1000 MG tablet TAKE 1 TABLET(1000 MG) BY MOUTH TWICE DAILY WITH A MEAL Patient taking differently: Take 1,000 mg 2 (two) times daily with a meal by mouth.  09/11/16   Shela Leff, MD  Olopatadine HCl (PATADAY) 0.2 % SOLN Place 1 drop daily into both eyes.     [provider]  [facility] DELICA LANCETS FINE MISC USE TO CHECK BLOOD SUGAR TWICE DAILY 04/16/16   Aldine Contes, MD  Polyethyl Glycol-Propyl Glycol (SYSTANE) 0.4-0.3 % GEL ophthalmic gel Place 1 application daily as needed into both eyes (dry eyes/irritation).     [provider]  potassium chloride 20 MEQ/15ML (10%) SOLN Take 15 mLs (20 mEq total) by mouth daily as needed (for muscle cramps). 11/22/16  Aldine Contes, MD  pregabalin (LYRICA) 50 MG capsule Take 1 capsule (50 mg total) by mouth 3 (three) times daily. 04/01/17 04/01/18  Aldine Contes, MD  rosuvastatin (CRESTOR) 20 MG tablet TAKE 1 TABLET(20 MG) BY MOUTH DAILY 02/18/17   Aldine Contes, MD  venlafaxine XR (EFFEXOR-XR) 150 MG 24 hr capsule TAKE 1 CAPSULE(150 MG) BY MOUTH DAILY WITH BREAKFAST 01/24/17   Annia Belt, MD    Family History Family History  Problem Relation Age of Onset  . Heart disease Father   . Diabetes Father   . Stroke Sister   . Anuerysm Sister 30       brain; maternal half-sister  . Heart disease Brother   . Anuerysm Brother 103       aortic; maternal half-brother  . Breast cancer Daughter 78       negative genetic testing in 2015  . Heart attack Daughter        2-47  . Diabetes Paternal Grandmother   . Stroke Paternal Grandfather   . Anuerysm Brother        NOS type; full brother  . Fibroids Daughter        s/p hysterectomy at 25y  . Brain cancer  Maternal Uncle        dx. older than 32; NOS type  . Brain cancer Cousin        maternal 1st cousin; d. early 64s; NOS type  . Deafness Paternal Uncle        prelingual    Social History Social History   Tobacco Use  . Smoking status: Never Smoker  . Smokeless tobacco: Never Used  Substance Use Topics  . Alcohol use: No    Alcohol/week: 0.0 oz  . Drug use: No     Allergies   Gabapentin; Penicillins; and Sulfa antibiotics   Review of Systems Review of Systems  Constitutional: Negative for chills, diaphoresis, fatigue and fever.  HENT: Negative for congestion.   Eyes: Negative for visual disturbance.  Respiratory: Negative for cough, chest tightness, shortness of breath and wheezing.   Cardiovascular: Negative for chest pain, palpitations and leg swelling.  Gastrointestinal: Negative for abdominal pain, bowel incontinence, constipation, diarrhea, nausea and vomiting.  Genitourinary: Negative for bladder incontinence, dysuria, flank pain and frequency.  Musculoskeletal: Positive for back pain. Negative for neck pain (improved) and neck stiffness.  Neurological: Positive for tingling and headaches. Negative for dizziness, syncope, facial asymmetry, weakness, light-headedness and numbness.  Psychiatric/Behavioral: Negative for agitation.  All other systems reviewed and are negative.    Physical Exam Updated Vital Signs BP (!) 169/96 (BP Location: Left Arm)   Pulse (!) 109   Resp (!) 29   SpO2 100%   Physical Exam  Constitutional: She is oriented to person, place, and time. She appears well-developed and well-nourished. No distress.  HENT:  Head: Head is without raccoon's eyes, without Battle's sign, without abrasion and without contusion.    Right Ear: External ear normal.  Left Ear: External ear normal.  Mouth/Throat: Oropharynx is clear and moist. No oropharyngeal exudate.  Eyes: Conjunctivae and EOM are normal. Pupils are equal, round, and reactive to light.    Neck: Neck supple.  Cardiovascular: Normal rate and intact distal pulses.  No murmur heard. Pulmonary/Chest: Effort normal and breath sounds normal. No respiratory distress. She has no wheezes. She has no rales. She exhibits no tenderness.  Abdominal: Soft. There is no tenderness.  Musculoskeletal: She exhibits tenderness. She exhibits no deformity.  Lumbar back: She exhibits tenderness and pain.       Back:  Lymphadenopathy:    She has no cervical adenopathy.  Neurological: She is alert and oriented to person, place, and time. She displays no tremor. No cranial nerve deficit or sensory deficit. She exhibits normal muscle tone.  Normal sensation in both legs.  Normal strength in both legs.  Normal reflexes in both legs.  Positive straight leg raise testing in both legs with pain shooting down the back of her legs to her heels.  Skin: Capillary refill takes less than 2 seconds. No rash noted. She is not diaphoretic. No erythema.  Psychiatric: She has a normal mood and affect.  Nursing note and vitals reviewed.    ED Treatments / Results  Labs (all labs ordered are listed, but only abnormal results are displayed) Labs Reviewed  CBC WITH DIFFERENTIAL/PLATELET - Abnormal; Notable for the following components:      Result Value   RBC 3.86 (*)    Hemoglobin 11.9 (*)    HCT 34.6 (*)    All other components within normal limits  COMPREHENSIVE METABOLIC PANEL - Abnormal; Notable for the following components:   Sodium 132 (*)    Chloride 97 (*)    Glucose, Bld 468 (*)    Creatinine, Ser 1.03 (*)    GFR calc non Af Amer 55 (*)    All other components within normal limits  PROTIME-INR    EKG  EKG Interpretation None       Radiology Ct Head Wo Contrast  Result Date: 04/01/2017 CLINICAL DATA:  Fall. Headache. Diplopia. Abnormal sensation in both lower extremities with weakness. Initial encounter. EXAM: CT HEAD WITHOUT CONTRAST CT CERVICAL SPINE WITHOUT CONTRAST TECHNIQUE:  Multidetector CT imaging of the head and cervical spine was performed following the standard protocol without intravenous contrast. Multiplanar CT image reconstructions of the cervical spine were also generated. COMPARISON:  02/19/2016 FINDINGS: CT HEAD FINDINGS Brain: There is no evidence of acute infarct, intracranial hemorrhage, mass, midline shift, or extra-axial fluid collection. The ventricles and sulci are normal. Vascular: Calcified atherosclerosis at the skull base. No hyperdense vessel. Skull: No fracture or focal osseous lesion. Sinuses/Orbits: Clear mastoid air cells. No significant inflammatory changes in the included paranasal sinuses. Bilateral cataract extraction. Other: Small forehead hematoma. CT CERVICAL SPINE FINDINGS Alignment: Normal. Skull base and vertebrae: No fracture or suspicious osseous lesion. Soft tissues and spinal canal: No prevertebral fluid or swelling. No visible canal hematoma. Disc levels: Mild endplate spurring at B0-1 and C6-7 with mild disc space narrowing at the former. Mild-to-moderate facet arthrosis on the right at C7-T1 and in the included upper thoracic spine. Upper chest: Permeative appearance of the right clavicle and right first rib with abnormal soft tissue in this region, incompletely imaged but grossly similar to the prior study and potentially related to patient's history of breast cancer. Clear lung apices. Other: Carotid artery atherosclerosis. IMPRESSION: 1. No evidence of acute intracranial abnormality. 2. Small forehead hematoma. 3. No cervical spine fracture. Electronically Signed   By: Logan Bores M.D.   On: 04/01/2017 19:18   Ct Cervical Spine Wo Contrast  Result Date: 04/01/2017 CLINICAL DATA:  Fall. Headache. Diplopia. Abnormal sensation in both lower extremities with weakness. Initial encounter. EXAM: CT HEAD WITHOUT CONTRAST CT CERVICAL SPINE WITHOUT CONTRAST TECHNIQUE: Multidetector CT imaging of the head and cervical spine was performed  following the standard protocol without intravenous contrast. Multiplanar CT image reconstructions of the cervical spine were also generated.  COMPARISON:  02/19/2016 FINDINGS: CT HEAD FINDINGS Brain: There is no evidence of acute infarct, intracranial hemorrhage, mass, midline shift, or extra-axial fluid collection. The ventricles and sulci are normal. Vascular: Calcified atherosclerosis at the skull base. No hyperdense vessel. Skull: No fracture or focal osseous lesion. Sinuses/Orbits: Clear mastoid air cells. No significant inflammatory changes in the included paranasal sinuses. Bilateral cataract extraction. Other: Small forehead hematoma. CT CERVICAL SPINE FINDINGS Alignment: Normal. Skull base and vertebrae: No fracture or suspicious osseous lesion. Soft tissues and spinal canal: No prevertebral fluid or swelling. No visible canal hematoma. Disc levels: Mild endplate spurring at Q7-3 and C6-7 with mild disc space narrowing at the former. Mild-to-moderate facet arthrosis on the right at C7-T1 and in the included upper thoracic spine. Upper chest: Permeative appearance of the right clavicle and right first rib with abnormal soft tissue in this region, incompletely imaged but grossly similar to the prior study and potentially related to patient's history of breast cancer. Clear lung apices. Other: Carotid artery atherosclerosis. IMPRESSION: 1. No evidence of acute intracranial abnormality. 2. Small forehead hematoma. 3. No cervical spine fracture. Electronically Signed   By: Logan Bores M.D.   On: 04/01/2017 19:18   Ct Lumbar Spine Wo Contrast  Result Date: 04/01/2017 CLINICAL DATA:  Fall. Radiculopathy. Abnormal sensation and weakness in both lower extremities. Low back pain. Initial encounter. EXAM: CT LUMBAR SPINE WITHOUT CONTRAST TECHNIQUE: Multidetector CT imaging of the lumbar spine was performed without intravenous contrast administration. Multiplanar CT image reconstructions were also generated.  COMPARISON:  CT abdomen and pelvis 02/19/2016. Lumbar spine MRI 08/09/2009. FINDINGS: Segmentation: Standard. Alignment: Mild lumbar dextroscoliosis. 5 mm of facet mediated anterolisthesis of L4 on L5, stable to minimally increased from the prior MRI. Vertebrae: Minimally displaced fracture of the tip of the right L5 transverse process. Preserved lumbar vertebral body heights. Minimal chronic anterior wedging of the T12 vertebral body. No suspicious osseous lesion. Paraspinal and other soft tissues: Abdominal aortic atherosclerosis without aneurysm. Slight fullness of the right renal collecting system and included right ureter, less prominent than on the prior CT. Moderately distended bladder, incompletely imaged. Disc levels: L1-2: Negative. L2-3: Mild facet arthrosis and mild disc bulging without significant stenosis. L3-4: Circumferential disc bulging and moderate facet hypertrophy result in mild spinal stenosis and mild-to-moderate left neural foraminal stenosis, both new from the 2011 MRI. L4-5: Progressive disc degeneration from 2011 with severe disc space narrowing and extensive degenerative endplate irregularity. Anterolisthesis, bulging uncovered disc, and severe facet arthrosis result in moderate to severe spinal stenosis and severe right and moderate left neural foraminal stenosis. L5-S1: Disc degeneration is new from 2011 and progressive from 2018 with vacuum disc, diffuse endplate sclerosis and irregularity, and Schmorl's nodes. Circumferential disc bulging and moderate facet hypertrophy result in mild spinal stenosis, bilateral lateral recess stenosis, and moderate bilateral neural foraminal stenosis. IMPRESSION: 1. Minimally displaced right L5 transverse process fracture. 2. Severe disc and facet degeneration at L4-5 resulting in moderate to severe spinal and neural foraminal stenosis. 3. Progressive L5-S1 disc degeneration resulting in mild spinal and moderate neural foraminal stenosis. 4.  Aortic  Atherosclerosis (ICD10-I70.0). Electronically Signed   By: Logan Bores M.D.   On: 04/01/2017 19:31    Procedures Procedures (including critical care time)  Medications Ordered in ED Medications  traMADol (ULTRAM) tablet 50 mg (50 mg Oral Given 04/01/17 2212)  LORazepam (ATIVAN) injection 1 mg (1 mg Intravenous Given 04/02/17 0142)     Initial Impression / Assessment and Plan / ED Course  I have reviewed the triage vital signs and the nursing notes.  Pertinent labs & imaging results that were available during my care of the patient were reviewed by me and considered in my medical decision making (see chart for details).     Krysten Veronica is a 67 y.o. female with a past medical history significant for hypertension, hyperkalemia, hypothyroidism, diabetes, and prior breast cancer who presents with a fall and associated headache and back pain.  Patient reports that she was at [place] this afternoon when her grand child began to fall and she tried to catch him.  She reports that while doing so she ended up falling forward and striking her head on the ground and falling flat.  She reports having immediate onset of headache as well as a hematoma forming on her left forehead as well as severe pain in her low back.  She reports a history of some back pains and stenosis but is never had back surgery.  She reports the pain was radiating bilaterally down her legs in a sciatic type pattern with associated tingling.  She denied numbness, urinary incontinence, or  incontinence of stool.  She reports that her pains were moderate in severity.  She reports her headache is currently a 4 out of 10 in severity with no vision changes.  She denies any diplopia currently.  She denies nausea or vomiting.  She denies any chest pain, palpitations or shortness of breath.  She denies abdominal pain.  She denies any hip pain or leg pains however when she tries to move her legs her low back is severe in pain.  She denies any  upper back or mid back pain.  She reported some possible neck pain initially but it has improved.  She arrived in a towel collar by EMS.  On exam, patient had positive straight leg raise tests bilaterally.  No pain in her hips with leg manipulation.  No numbness was present.  Patient had normal reflexes and normal sensation in the legs.  Patient had no clonus on bilateral feet.  Patient had no arm tenderness and normal grips bilaterally.  Normal sensation in upper extremities.  Negative Hoffmann sign in arms.  Normal pulses in upper extremities.  Chest nontender and lungs clear.  No murmurs appreciated.  Abdomen nontender.  Neck and back nontender.  Patient has a hematoma on her left forehead with normal extraocular movement and pupil exam.  Patient has some dried blood on her left nare with no nasal septal hematoma.  No tenderness on the nose.    Based on exam patient will have imaging of the head, neck, and lumbar spine to look for injuries.  Given her history of cancer, occult fracture is considered as it was a fall from standing.  We will get CT scan initially however if she has persistent tingling or any weakness develops, may need to consider MRI of the lumbar spine.  Patient will have some screening laboratory testing but she does not want any pain medicine on arrival.    Anticipate reassessment after workup.  Diagnostic testing results are seen above.  CT scan showed evidence of a minimally displaced right sided L5 transverse process tip fracture.   Patient was reassessed and continued to have some pain.  With the absence of persistent neurologic deficits, initially patient was felt stable for discharge home without MRI however when patient tried to ambulate she did have some tingling and pain worsening.  Patient will have MRI.  Anticipate discharge if  MRI reassuring.  Care transferred to Dr. Christy Gentles while waiting for MRI to return.  Anticipate discharge with Ultram if MRI is reassuring.   Patient has a previous spine team and will call them tomorrow.  Final Clinical Impressions(s) / ED Diagnoses   Final diagnoses:  Fall, initial encounter  Acute midline low back pain with bilateral sciatica  Closed fracture of transverse process of lumbar vertebra, initial encounter (HCC)    Clinical Impression: 1. Fall, initial encounter   2. Acute midline low back pain with bilateral sciatica   3. Closed fracture of transverse process of lumbar vertebra, initial encounter [facility])     Disposition: Care transferred to Dr. Christy Gentles while awaiting reassessment after MRI.      Terrika Zuver, Gwenyth Allegra, MD 04/02/17 [phone]

## 2017-04-01 NOTE — ED Notes (Signed)
Pt ambulated in hallway per MD request. Pt using cane was somewhat unsteady on her feet. Pt states her legs "feel heavy". MD aware.

## 2017-04-01 NOTE — Assessment & Plan Note (Signed)
-  This problem is chronic and stable -Patient is compliant with her Crestor 20 mg -We will recheck her lipid panel on her follow-up visit

## 2017-04-01 NOTE — Assessment & Plan Note (Signed)
-  Patient follows up with Dr. Renne Crigler for her diabetes -Her blood sugars have been uncontrolled with last A1c being 13 -Her blood sugar today was 395 -Patient states that she is compliant with her metformin, Victoza, Levemir and Humalog pre-meal -Explained to patient the importance of following her diet and taking all her medications.  Patient expresses understanding and will follow closely with Dr. Renne Crigler to better control her diabetes

## 2017-04-01 NOTE — ED Notes (Addendum)
Right arm restricted, due to mastectomy, band in place

## 2017-04-01 NOTE — Assessment & Plan Note (Signed)
-  Patient was diagnosed with mild right hydroureteronephrosis in January of last year after episode of acute urinary retention -She has been following with urology-Dr. Jeffie Pollock for this -She missed her last appointment with Dr. Jeffie Pollock and she wanted to order a Lasix renogram for her -We will order this test for her today

## 2017-04-01 NOTE — Assessment & Plan Note (Signed)
BP Readings from Last 3 Encounters:  04/01/17 140/84  03/14/17 134/86  01/30/17 126/80    Lab Results  Component Value Date   NA 140 12/09/2016   K 4.7 12/09/2016   CREATININE 0.88 12/09/2016    Assessment: Blood pressure control:  Fair Progress toward BP goal:   Unchanged Comments: Patient is compliant with amlodipine 10 mg as well as HCTZ 12.5 mg  Plan: Medications:  continue current medications Educational resources provided:   Self management tools provided:   Other plans: We will check BMP

## 2017-04-01 NOTE — Assessment & Plan Note (Signed)
-  This problem is chronic and stable -Most likely etiology of her neuropathy is her uncontrolled diabetes -Her symptoms are well controlled with Lyrica -We will refill this medication for her today -She had extensive workup in November which was negative including a TSH, B12 and RPR

## 2017-04-01 NOTE — Patient Instructions (Signed)
-  It was a pleasure seeing you today -Please follow up with Dr. Renne Crigler for your diabetes. Your blood sugars remain high at this time -We will check some blood work on you today -I will refer you back to urology for follow up -I will also try and order the scan your urologist wants

## 2017-04-01 NOTE — Progress Notes (Signed)
   Subjective:    Patient ID: [name], female    DOB: Apr 05, 1950, 67 y.o.   MRN: [id number]  HPI  Patient is here for routine follow-up of her hypertension and diabetes.  Patient states that she is compliant with all her medications and denies any new complaints at this time.  Patient did state that she missed her appointment with her urologist (Dr. Jeffie Pollock) and will need to be referred back to him.  Patient also states that she is under a lot of stress as both her daughters are sick.  Review of Systems  Constitutional: Negative.   HENT: Positive for rhinorrhea. Negative for congestion, facial swelling, postnasal drip, sneezing and sore throat.   Respiratory: Negative.   Cardiovascular: Negative.   Gastrointestinal: Negative.   Musculoskeletal: Negative.   Neurological: Negative.   Psychiatric/Behavioral: Negative.        Objective:   Physical Exam  Constitutional: She is oriented to person, place, and time. She appears well-developed and well-nourished.  HENT:  Head: Normocephalic and atraumatic.  Mouth/Throat: No oropharyngeal exudate.  Neck: Neck supple.  Cardiovascular: Normal rate, regular rhythm and normal heart sounds.  Pulmonary/Chest: Effort normal and breath sounds normal. No respiratory distress. She has no wheezes.  Abdominal: Soft. Bowel sounds are normal. She exhibits no distension. There is no tenderness.  Musculoskeletal: Normal range of motion. She exhibits no edema.  Lymphadenopathy:    She has no cervical adenopathy.  Neurological: She is alert and oriented to person, place, and time.  Psychiatric: She has a normal mood and affect. Her behavior is normal.          Assessment & Plan:  Please see problem based charting for assessment and plan:

## 2017-04-01 NOTE — Assessment & Plan Note (Addendum)
-  This problem is chronic and stable -Patient denies any symptoms of hypothyroidism  -Patient states she is compliant with her Synthroid daily -We will check a TSH today

## 2017-04-02 ENCOUNTER — Emergency Department (HOSPITAL_COMMUNITY): Payer: Medicare Other

## 2017-04-02 ENCOUNTER — Encounter: Payer: Self-pay | Admitting: Internal Medicine

## 2017-04-02 DIAGNOSIS — S32058A Other fracture of fifth lumbar vertebra, initial encounter for closed fracture: Secondary | ICD-10-CM | POA: Diagnosis not present

## 2017-04-02 LAB — BMP8+ANION GAP
Anion Gap: 17 mmol/L (ref 10.0–18.0)
BUN/Creatinine Ratio: 10 — ABNORMAL LOW (ref 12–28)
BUN: 11 mg/dL (ref 8–27)
CO2: 25 mmol/L (ref 20–29)
Calcium: 10.1 mg/dL (ref 8.7–10.3)
Chloride: 91 mmol/L — ABNORMAL LOW (ref 96–106)
Creatinine, Ser: 1.08 mg/dL — ABNORMAL HIGH (ref 0.57–1.00)
GFR calc Af Amer: 62 mL/min/{1.73_m2} (ref >59)
GFR calc non Af Amer: 54 mL/min/{1.73_m2} — ABNORMAL LOW (ref >59)
Glucose: 435 mg/dL — ABNORMAL HIGH (ref 65–99)
Potassium: 4 mmol/L (ref 3.5–5.2)
Sodium: 133 mmol/L — ABNORMAL LOW (ref 134–144)

## 2017-04-02 LAB — TSH: TSH: 4.7 u[IU]/mL — ABNORMAL HIGH (ref 0.450–4.500)

## 2017-04-02 NOTE — Progress Notes (Signed)
Attempted to call patient twice with no answer.  Left voicemail after first call and advised patient to call me back.  Will send letter with patient results to her.

## 2017-04-02 NOTE — ED Notes (Signed)
Patient transported to MRI 

## 2017-04-02 NOTE — ED Notes (Signed)
PT states understanding of care given, follow up care. PT ambulated from ED to car with a steady gait.  

## 2017-04-02 NOTE — Discharge Instructions (Signed)

## 2017-04-02 NOTE — ED Provider Notes (Signed)
I assumed care at sign out to follow-up on MRI.  MRI results noted.  No signs of any need for acute neurosurgical intervention, however she will need close follow-up due to the stenosis that was noted.  She can ambulate, and she feels comfortable going home.  She reports she has pain medicines at home.  She reports she is seeing Dr. Rolena Infante before for her back.  She moves around the bed in no distress and has no focal weakness in her legs.  We discussed strict return precautions   Ripley Fraise, MD 04/02/17 [phone]

## 2017-04-07 ENCOUNTER — Telehealth: Payer: Self-pay

## 2017-04-07 NOTE — Telephone Encounter (Signed)
Requesting lab result. Please call pt back.  

## 2017-04-08 ENCOUNTER — Telehealth: Payer: Self-pay | Admitting: *Deleted

## 2017-04-08 ENCOUNTER — Ambulatory Visit: Payer: Medicare Other | Admitting: Podiatry

## 2017-04-08 NOTE — Telephone Encounter (Signed)
Routing to PCP for lab review. Hubbard Hartshorn, RN, BSN

## 2017-04-08 NOTE — Telephone Encounter (Signed)
I tried calling her and so did [place]. We are awaiting a call back for further clarification

## 2017-04-08 NOTE — Telephone Encounter (Signed)
Requesting lab results that was done on 04/01/2017. Please call pt back.

## 2017-04-08 NOTE — Telephone Encounter (Signed)
RTC to patient to ask about lab results that she is needing.  Message left that the Clinics was returning her call.  Sander Nephew, RN 04/08/2017 11:41 AM.

## 2017-04-08 NOTE — Telephone Encounter (Signed)
Which labs?

## 2017-04-09 ENCOUNTER — Encounter (INDEPENDENT_AMBULATORY_CARE_PROVIDER_SITE_OTHER): Payer: Medicare Other | Admitting: Ophthalmology

## 2017-04-09 ENCOUNTER — Telehealth: Payer: Self-pay | Admitting: Internal Medicine

## 2017-04-09 DIAGNOSIS — H43813 Vitreous degeneration, bilateral: Secondary | ICD-10-CM | POA: Diagnosis not present

## 2017-04-09 DIAGNOSIS — H35033 Hypertensive retinopathy, bilateral: Secondary | ICD-10-CM | POA: Diagnosis not present

## 2017-04-09 DIAGNOSIS — E113312 Type 2 diabetes mellitus with moderate nonproliferative diabetic retinopathy with macular edema, left eye: Secondary | ICD-10-CM

## 2017-04-09 DIAGNOSIS — I10 Essential (primary) hypertension: Secondary | ICD-10-CM

## 2017-04-09 DIAGNOSIS — E113591 Type 2 diabetes mellitus with proliferative diabetic retinopathy without macular edema, right eye: Secondary | ICD-10-CM

## 2017-04-09 DIAGNOSIS — E11311 Type 2 diabetes mellitus with unspecified diabetic retinopathy with macular edema: Secondary | ICD-10-CM

## 2017-04-09 NOTE — Telephone Encounter (Signed)
I called the patient to discuss results of her blood work with her.  Patient was noted to have an elevated creatinine of 1.08 on March 5 which improved the same day when she went to the ED to 1.03.  I suspect this may be secondary to mild dehydration from her hyperglycemia and advised her to keep herself hydrated and to get her blood sugars under better control.  Patient is also noted to have a mildly decreased hemoglobin to 11.9 which is similar to her previous hemoglobin.  We will repeat her blood work on her follow-up appointment.  No further workup at this time.  Patient expresses understanding and is in agreement with plan.

## 2017-04-11 NOTE — Telephone Encounter (Signed)
This was addressed by PCP in separate encounter on 04/09/2017. Hubbard Hartshorn, RN, BSN

## 2017-04-12 ENCOUNTER — Other Ambulatory Visit: Payer: Self-pay | Admitting: Internal Medicine

## 2017-04-12 ENCOUNTER — Other Ambulatory Visit: Payer: Self-pay | Admitting: Oncology

## 2017-04-12 DIAGNOSIS — E039 Hypothyroidism, unspecified: Secondary | ICD-10-CM

## 2017-04-12 DIAGNOSIS — I10 Essential (primary) hypertension: Secondary | ICD-10-CM

## 2017-04-14 NOTE — Telephone Encounter (Signed)
unable to reach by phone. Mailed invitation to diabetes group meeting

## 2017-04-17 ENCOUNTER — Ambulatory Visit: Payer: Medicare Other | Admitting: Dietician

## 2017-04-23 DIAGNOSIS — H401131 Primary open-angle glaucoma, bilateral, mild stage: Secondary | ICD-10-CM | POA: Diagnosis not present

## 2017-04-23 DIAGNOSIS — H11423 Conjunctival edema, bilateral: Secondary | ICD-10-CM | POA: Diagnosis not present

## 2017-04-23 DIAGNOSIS — H18413 Arcus senilis, bilateral: Secondary | ICD-10-CM | POA: Diagnosis not present

## 2017-04-23 DIAGNOSIS — H1045 Other chronic allergic conjunctivitis: Secondary | ICD-10-CM | POA: Diagnosis not present

## 2017-04-23 DIAGNOSIS — H04123 Dry eye syndrome of bilateral lacrimal glands: Secondary | ICD-10-CM | POA: Diagnosis not present

## 2017-04-29 ENCOUNTER — Ambulatory Visit: Payer: Medicare Other | Admitting: Podiatry

## 2017-04-29 ENCOUNTER — Encounter: Payer: Self-pay | Admitting: Internal Medicine

## 2017-04-29 ENCOUNTER — Ambulatory Visit (INDEPENDENT_AMBULATORY_CARE_PROVIDER_SITE_OTHER): Payer: Medicare Other | Admitting: Internal Medicine

## 2017-04-29 VITALS — BP 108/72 | HR 90 | Ht 62.5 in | Wt 127.4 lb

## 2017-04-29 DIAGNOSIS — E039 Hypothyroidism, unspecified: Secondary | ICD-10-CM | POA: Diagnosis not present

## 2017-04-29 DIAGNOSIS — Z794 Long term (current) use of insulin: Secondary | ICD-10-CM

## 2017-04-29 DIAGNOSIS — E1142 Type 2 diabetes mellitus with diabetic polyneuropathy: Secondary | ICD-10-CM | POA: Diagnosis not present

## 2017-04-29 MED ORDER — INSULIN DETEMIR 100 UNIT/ML FLEXPEN: 18.0000 [IU] | PEN_INJECTOR | Freq: Every day | SUBCUTANEOUS | Status: DC

## 2017-04-29 MED ORDER — INSULIN LISPRO 100 UNIT/ML (KWIKPEN): 5.0000 [IU] | PEN_INJECTOR | Freq: Three times a day (TID) | SUBCUTANEOUS | 5 refills | Status: DC

## 2017-04-29 NOTE — Progress Notes (Signed)
Patient ID: [name], female   DOB: 1950/05/11, 67 y.o.   MRN: [id number]   HPI: [name] is a 67 y.o.-year-old female, returning for follow-up for DM2, dx in 1991-1992, insulin-dependent since 2000s, uncontrolled, with medication noncompliance;  + complications (DR, PN). Last visit 1.5 months ago.  She is stressed as her daughter has a brain tumor and she is in hospice and her other daughter had her leg amputated.  Last hemoglobin A1c was: Lab Results  Component Value Date   HGBA1C 13.0 03/14/2017   HGBA1C 11.8 (H) 12/09/2016   HGBA1C >14.0 09/03/2016   Pt is on a regimen of: - Metformin 1000 mg 2x a day, with meals - Victoza 1.8 mg in am before b'fast  - Levemir 14 units in a.m. - Humalog: she takes 3 units per meal - misses doses  Pt checks her sugars 1-2 times a day: - am: 223-449, but after 01/12/2017: 80-174 >> 200s >> 70 x1, 213-437 - 2h after b'fast: n/c >> 132 >> n/c >> 302, 355 - before lunch: 94-156, 171, 308 >> 151 >> n/c >> 303 - 2h after lunch: 143, 315 >> 95, 189 >> 151 >> n/c - before dinner:if no Humalog: 238, 250  >>93, 186 >> 500 x1 >> 300, 507 - 2h after dinner: 413 >> 107, 201 >> 163, 257 >> n/c >> 282 - bedtime: 164 >> 108, 256 >> 220 >> n/c - nighttime:  67 x1 - Christmas Eve >> 61 x1 >> 80 >> n/c Lowest sugar was 80 >> 70 x1; she has hypoglycemia awareness in the 80s. Highest sugar was 500 >> 507.  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  - + mild CKD, last BUN/creatinine:  Lab Results  Component Value Date   BUN 12 04/01/2017   BUN 11 04/01/2017   CREATININE 1.03 (H) 04/01/2017   CREATININE 1.08 (H) 04/01/2017   -+ HL; last set of lipids: Lab Results  Component Value Date   CHOL 183 05/28/2016   HDL 52 05/28/2016   LDLCALC 115 (H) 05/28/2016   TRIG 82 05/28/2016   CHOLHDL 3.5 05/28/2016  On Crestor. - last eye exam was in 02/2017: + DR R .  Gets intraocular injections. Had Laser Sx.  -+ Numbness  and tingling in her feet.  She also has hypothyroidism.  Latest TSH was slightly high: Lab Results  Component Value Date   TSH 4.700 (H) 04/01/2017   She is on levothyroxine 88 mcg daily: - in am - fasting - at least 30 min from b'fast - no Ca, Fe, PPIs - + MVI but did not move it later in the day as advised at last visit, but takes this at b'fast - not on Biotin  ROS: Constitutional: no weight gain/no weight loss, no fatigue, no subjective hyperthermia, no subjective hypothermia Eyes: no blurry vision, no xerophthalmia ENT: no sore throat, no nodules palpated in throat, no dysphagia, no odynophagia, no hoarseness Cardiovascular: no CP/no SOB/no palpitations/no leg swelling Respiratory: no cough/no SOB/no wheezing Gastrointestinal: no N/no V/no D/no C/no acid reflux Musculoskeletal: no muscle aches/no joint aches Skin: no rashes, no hair loss Neurological: no tremors/+ see HPI  I reviewed pt's medications, allergies, PMH, social hx, family hx, and changes were documented in the history of present illness. Otherwise, unchanged from my initial visit note.  Past Medical History:  Diagnosis Date  . Arthritis   . Borderline glaucoma of both eyes   . Depression   .  Diabetic polyneuropathy ([place])   . Diverticulosis of colon   . Dry eyes, bilateral   . Feeling of incomplete bladder emptying   . History of breast cancer oncologist-  dr Waymon Budge-- per lov note no recurrence   dx 04/ 1999 --- Stage 3B-- s/p  chemotherapy then right mastectomy then concurrent chemoradiation therapy  . History of urinary retention    01/ 2018  . Hydronephrosis of right kidney   . Hyperlipidemia   . Hypertension   . Hypothyroidism   . Insulin dependent type 2 diabetes mellitus [facility])    endocrinologist-  dr Cruzita Lederer---  last A1c 8.7 on 02-20-2016  . Lymphedema of upper extremity    right  . Macular degeneration, left eye    followed by dr Zigmund Daniel  . Renal insufficiency   . Urgency of urination     Past Surgical History:  Procedure Laterality Date  . CARPAL TUNNEL RELEASE Right 2017   and tendon repair  . CATARACT EXTRACTION W/ INTRAOCULAR LENS  IMPLANT, BILATERAL  2017  . CYSTO/  RIGHT RETROGRADE PYELOGRAM/  UNROOFING RIGHT URETEROCELE  09/18/2000  . CYSTOSCOPY/RETROGRADE/URETEROSCOPY Right 05/09/2016   Procedure: CYSTOSCOPY and right RETROGRADE;  Surgeon: Irine Seal, MD;  Location: [facility];  Service: Urology;  Laterality: Right;  . FINGER SURGERY Left x2 prior to 09-09-2014  . I&D EXTREMITY Left 09/09/2014   Procedure: IRRIGATION AND DEBRIDEMENT LEFT THUMB DISTAL PHALANX;  Surgeon: Daryll Brod, MD;  Location: [place];  Service: Orthopedics;  Laterality: Left;  [name] MASTECTOMY Right 1999   w/  Node dissection's  . PORT-A-CATH REMOVAL  05/01/1999  . TUBAL LIGATION    . VAGINAL HYSTERECTOMY  1970's  . WRIST GANGLION EXCISION Right 2016   Social History   Social History  . Marital status: Widow     Spouse name: N/A  . Number of children: 4   Occupational History  . Retired   Social History Main Topics  . Smoking status: Smoker  . Smokeless tobacco: Never Used  . Alcohol use yes  . Drug use: yes   Current Outpatient Medications on File Prior to Visit  Medication Sig Dispense Refill  . acetaminophen (MAPAP) 325 MG tablet Take 2 tablets (650 mg total) by mouth every 6 (six) hours as needed for moderate pain (Do not take more than 8 tablets per day). 90 tablet 0  . amLODipine (NORVASC) 10 MG tablet TAKE 1 TABLET(10 MG) BY MOUTH DAILY 90 tablet 1  . aspirin EC 81 MG tablet Take 81 mg by mouth at bedtime.    [name] Besifloxacin HCl (BESIVANCE) 0.6 % SUSP Place 1 drop into the left eye daily. x2 days after eye injection.    . calcium citrate-vitamin D (CITRACAL+D) 315-200 MG-UNIT tablet Take 1 tablet by mouth 2 (two) times daily. 100 tablet 3  . Cholecalciferol (VITAMIN D3) 2000 units TABS Take 2,000 Units at bedtime by mouth.     . cycloSPORINE  (RESTASIS) 0.05 % ophthalmic emulsion Place 1 drop daily into both eyes.     [name] glucose 4 GM chewable tablet Chew 4 tablets (16 g total) by mouth as needed for low blood sugar. 50 tablet 12  . glucose blood (ONETOUCH VERIO) test strip USE TO CHECK BLOOD SUGAR TWICE DAILY 200 each 3  . hydrochlorothiazide (MICROZIDE) 12.5 MG capsule TAKE 1 CAPSULE BY MOUTH EVERY DAY 90 capsule 0  . Influenza vac split quadrivalent PF (FLUZONE HIGH-DOSE) 0.5 ML injection Inject 0.5 mLs into the skin once.  september    . Insulin Detemir (LEVEMIR FLEXTOUCH) 100 UNIT/ML Pen Inject 14 Units into the skin daily before breakfast.    . insulin lispro (HUMALOG KWIKPEN) 100 UNIT/ML KiwkPen Inject 0.04-0.06 mLs (4-6 Units total) into the skin 3 (three) times daily. 15 mL 5  . Insulin Pen Needle (BD PEN NEEDLE NANO U/F) 32G X 4 MM MISC USE AS DIRECTED 100 each 0  . levothyroxine (SYNTHROID, LEVOTHROID) 88 MCG tablet TAKE 1 TABLET BY MOUTH EVERY MORNING BEFORE BREAKFAST 90 tablet 0  . liraglutide (VICTOZA) 18 MG/3ML SOPN ADMINISTER 1.8 MG UNDER THE SKIN DAILY 9 mL 5  . metFORMIN (GLUCOPHAGE) 1000 MG tablet TAKE 1 TABLET(1000 MG) BY MOUTH TWICE DAILY WITH A MEAL (Patient taking differently: Take 1,000 mg 2 (two) times daily with a meal by mouth. ) 180 tablet 3  . Olopatadine HCl (PATADAY) 0.2 % SOLN Place 1 drop into both eyes daily as needed (dry eyes).     Glory Rosebush DELICA LANCETS FINE MISC USE TO CHECK BLOOD SUGAR TWICE DAILY 200 each 3  . Polyethyl Glycol-Propyl Glycol (SYSTANE) 0.4-0.3 % GEL ophthalmic gel Place 1 application daily as needed into both eyes (dry eyes/irritation).     . potassium chloride 20 MEQ/15ML (10%) SOLN Take 15 mLs (20 mEq total) by mouth daily as needed (for muscle cramps). 240 mL 0  . pregabalin (LYRICA) 50 MG capsule Take 1 capsule (50 mg total) by mouth 3 (three) times daily. 90 capsule 2  . rosuvastatin (CRESTOR) 20 MG tablet TAKE 1 TABLET(20 MG) BY MOUTH DAILY 90 tablet 0  . venlafaxine XR  (EFFEXOR-XR) 150 MG 24 hr capsule TAKE 1 CAPSULE BY MOUTH EVERY DAY WITH BREAKFAST 90 capsule 0   No current facility-administered medications on file prior to visit.    Allergies  Allergen Reactions  . Gabapentin Other (See Comments)    Dizziness from 300 mg, tolerates 100 mg  . Penicillins Rash    Has patient had a PCN reaction causing immediate rash, facial/tongue/throat swelling, SOB or lightheadedness with hypotension: Yes Has patient had a PCN reaction causing severe rash involving mucus membranes or skin necrosis: No Has patient had a PCN reaction that required hospitalization: pt was in hospital at time of reaction Has patient had a PCN reaction occurring within the last 10 years: No If all of the above answers are "NO", then may proceed with Cephalosporin use.  . Sulfa Antibiotics Rash   Family History  Problem Relation Age of Onset  . Heart disease Father   . Diabetes Father   . Stroke Sister   . Anuerysm Sister 26       brain; maternal half-sister  . Heart disease Brother   . Anuerysm Brother 88       aortic; maternal half-brother  . Breast cancer Daughter 75       negative genetic testing in 2015  . Heart attack Daughter        60-47  . Diabetes Paternal Grandmother   . Stroke Paternal Grandfather   . Anuerysm Brother        NOS type; full brother  . Fibroids Daughter        s/p hysterectomy at 8y  . Brain cancer Maternal Uncle        dx. older than 37; NOS type  . Brain cancer Cousin        maternal 1st cousin; d. early 6s; NOS type  . Deafness Paternal Uncle  prelingual   PE: BP 108/72   Pulse 90   Ht 5' 2.5" (1.588 m)   Wt 127 lb 6.4 oz (57.8 kg)   SpO2 99%   BMI 22.93 kg/m  Wt Readings from Last 3 Encounters:  04/29/17 127 lb 6.4 oz (57.8 kg)  04/01/17 127 lb 12.8 oz (58 kg)  03/14/17 130 lb (59 kg)   Constitutional: Normal weight, in NAD Eyes: PERRLA, EOMI, no exophthalmos ENT: moist mucous membranes, no thyromegaly, no cervical  lymphadenopathy Cardiovascular: RRR, No MRG Respiratory: CTA B Gastrointestinal: abdomen soft, NT, ND, BS+ Musculoskeletal: no deformities, strength intact in all 4 Skin: moist, warm, no rashes Neurological: no tremor with outstretched hands, DTR normal in all 4  ASSESSMENT: 1. DM2, insulin-dependent, uncontrolled, with complications - DR - PN  2. Hypothyroidism  PLAN:  1. Patient with long-standing, uncontrolled, diabetes, on basal insulin, GLP-1 receptor agonist, metformin and with Humalog added back at last visit, as Prandin was not working efficiently for her. - she started Humalog low dose >> she misses doses. Also, she does not use the higher recommended doses. Will increase the insulin doses. She did have a lower CBG at 70 one am (unclear why), so we have to be careful with her insulin totration. - I suggested to:  Patient Instructions  Please continue: - Metformin 1000 mg 2x a day, with meals - Victoza 1.8 mg in am before b'fast   Please increase: - Levemir 18 units in a.m. - Humalog:- 5 units with a regular meal - 7 units with a larger meal   Please move the multivitamin to lunchtime or dinnertime.  Please return in 1.5 months with your sugar log.   - Reviewed latest HbA1c, which was elevated at last visit, at 13% - continue checking sugars at different times of the day - check 3x a day, rotating checks - advised for yearly eye exams >> she is UTD - Return to clinic in 1.5 mo with sugar log    2. Hypothyroidism - latest thyroid labs reviewed with pt >> slightly high last month - she continues on LT4 88 mcg daily - pt feels good on this dose. - we discussed about taking the thyroid hormone every day, with water, >30 minutes before breakfast, separated by >4 hours from acid reflux medications, calcium, iron, multivitamins. Pt. is still not taking this correctly >> did not move MVI later in the day >> advised to do so   Philemon Kingdom, MD PhD [facility]  Endocrinology

## 2017-04-29 NOTE — Patient Instructions (Addendum)
Please continue: - Metformin 1000 mg 2x a day, with meals - Victoza 1.8 mg in am before b'fast   Please increase: - Levemir 18 units in a.m. - Humalog:- 5 units with a regular meal - 7 units with a larger meal   Please move the multivitamin to lunchtime or dinnertime.  Please return in 1.5 months with your sugar log.

## 2017-04-30 ENCOUNTER — Ambulatory Visit: Payer: Medicare Other | Admitting: Internal Medicine

## 2017-05-07 ENCOUNTER — Encounter (INDEPENDENT_AMBULATORY_CARE_PROVIDER_SITE_OTHER): Payer: Medicare Other | Admitting: Ophthalmology

## 2017-05-07 ENCOUNTER — Other Ambulatory Visit: Payer: Self-pay | Admitting: *Deleted

## 2017-05-07 DIAGNOSIS — E113591 Type 2 diabetes mellitus with proliferative diabetic retinopathy without macular edema, right eye: Secondary | ICD-10-CM

## 2017-05-07 DIAGNOSIS — E11311 Type 2 diabetes mellitus with unspecified diabetic retinopathy with macular edema: Secondary | ICD-10-CM | POA: Diagnosis not present

## 2017-05-07 DIAGNOSIS — H35033 Hypertensive retinopathy, bilateral: Secondary | ICD-10-CM

## 2017-05-07 DIAGNOSIS — M5432 Sciatica, left side: Secondary | ICD-10-CM

## 2017-05-07 DIAGNOSIS — E113312 Type 2 diabetes mellitus with moderate nonproliferative diabetic retinopathy with macular edema, left eye: Secondary | ICD-10-CM | POA: Diagnosis not present

## 2017-05-07 DIAGNOSIS — H43813 Vitreous degeneration, bilateral: Secondary | ICD-10-CM | POA: Diagnosis not present

## 2017-05-07 DIAGNOSIS — I10 Essential (primary) hypertension: Secondary | ICD-10-CM | POA: Diagnosis not present

## 2017-05-07 MED ORDER — ACETAMINOPHEN 325 MG PO TABS: 650.0000 mg | ORAL_TABLET | Freq: Four times a day (QID) | ORAL | 0 refills | Status: DC | PRN

## 2017-05-12 ENCOUNTER — Encounter (HOSPITAL_COMMUNITY)
Admission: RE | Admit: 2017-05-12 | Discharge: 2017-05-12 | Disposition: A | Discharge status: Home / Self Care | Payer: Medicare Other | Source: Ambulatory Visit | Attending: Urology | Admitting: Urology

## 2017-05-12 DIAGNOSIS — N1339 Other hydronephrosis: Secondary | ICD-10-CM | POA: Diagnosis not present

## 2017-05-12 DIAGNOSIS — N1330 Unspecified hydronephrosis: Secondary | ICD-10-CM

## 2017-05-12 MED ORDER — FUROSEMIDE 10 MG/ML IJ SOLN
INTRAMUSCULAR | Status: AC
  Filled 2017-05-12: qty 4

## 2017-05-12 MED ORDER — TECHNETIUM TC 99M MERTIATIDE
5.0000 | Freq: Once | INTRAVENOUS | Status: AC | PRN
  Administered 2017-05-12: 5 via INTRAVENOUS

## 2017-05-12 MED ORDER — FUROSEMIDE 10 MG/ML IJ SOLN: 29.0000 mg | Freq: Once | INTRAMUSCULAR | Status: DC

## 2017-05-15 DIAGNOSIS — R3914 Feeling of incomplete bladder emptying: Secondary | ICD-10-CM | POA: Diagnosis not present

## 2017-05-15 DIAGNOSIS — N1330 Unspecified hydronephrosis: Secondary | ICD-10-CM | POA: Diagnosis not present

## 2017-05-23 ENCOUNTER — Encounter: Payer: Self-pay | Admitting: Podiatry

## 2017-05-23 ENCOUNTER — Ambulatory Visit (INDEPENDENT_AMBULATORY_CARE_PROVIDER_SITE_OTHER): Payer: Medicare Other | Admitting: Podiatry

## 2017-05-23 DIAGNOSIS — M79676 Pain in unspecified toe(s): Secondary | ICD-10-CM

## 2017-05-23 DIAGNOSIS — B351 Tinea unguium: Secondary | ICD-10-CM | POA: Diagnosis not present

## 2017-05-23 DIAGNOSIS — L84 Corns and callosities: Secondary | ICD-10-CM

## 2017-05-23 NOTE — Progress Notes (Signed)
Patient ID: [name], female   DOB: 1950-10-20, 67 y.o.   MRN: [id number] Complaint:  Visit Type: Patient returns to my office for continued preventative foot care services. Complaint: Patient states" my nails have grown long and thick and become painful to walk and wear shoes" Patient has been diagnosed with DM with no foot complications. The patient presents for preventative foot care services. No changes to ROS.  Painful callus under the ball of left foot.  Podiatric Exam: Vascular: dorsalis pedis and posterior tibial pulses are palpable bilateral. Capillary return is immediate. Temperature gradient is WNL. Skin turgor WNL  Sensorium: Normal Semmes Weinstein monofilament test. Normal tactile sensation bilaterally. Nail Exam: Pt has thick disfigured discolored nails with subungual debris noted bilateral entire nail hallux through fifth toenails Ulcer Exam: There is no evidence of ulcer or pre-ulcerative changes or infection. Orthopedic Exam: Muscle tone and strength are WNL. No limitations in general ROM. No crepitus or effusions noted. Foot type and digits show no abnormalities. Bony prominences are unremarkable. Skin: No Porokeratosis. No infection or ulcers.  Heloma durum fifth toes B/L s/p surgery by Little Ishikawa.  symptomatic porokeratosis sub 1 left foot.  Diagnosis:  Onychomycosis, , Pain in right toe, pain in left toes,  Heloma durum  B/L   Treatment & Plan Procedures and Treatment: Consent by patient was obtained for treatment procedures. The patient understood the discussion of treatment and procedures well. All questions were answered thoroughly reviewed. Debridement of mycotic and hypertrophic toenails, 1 through 5 bilateral and clearing of subungual debris. No ulceration, no infection noted.  Return Visit-Office Procedure: Patient instructed to return to the office for a follow up visit 3 months for continued evaluation and treatment.  [name] DPM

## 2017-06-01 ENCOUNTER — Other Ambulatory Visit: Payer: Self-pay | Admitting: Internal Medicine

## 2017-06-16 ENCOUNTER — Encounter (INDEPENDENT_AMBULATORY_CARE_PROVIDER_SITE_OTHER): Payer: Medicare Other | Admitting: Ophthalmology

## 2017-06-19 ENCOUNTER — Encounter (INDEPENDENT_AMBULATORY_CARE_PROVIDER_SITE_OTHER): Payer: Medicare Other | Admitting: Ophthalmology

## 2017-06-19 DIAGNOSIS — H35033 Hypertensive retinopathy, bilateral: Secondary | ICD-10-CM

## 2017-06-19 DIAGNOSIS — E113511 Type 2 diabetes mellitus with proliferative diabetic retinopathy with macular edema, right eye: Secondary | ICD-10-CM

## 2017-06-19 DIAGNOSIS — E113312 Type 2 diabetes mellitus with moderate nonproliferative diabetic retinopathy with macular edema, left eye: Secondary | ICD-10-CM

## 2017-06-19 DIAGNOSIS — I10 Essential (primary) hypertension: Secondary | ICD-10-CM

## 2017-06-19 DIAGNOSIS — E11311 Type 2 diabetes mellitus with unspecified diabetic retinopathy with macular edema: Secondary | ICD-10-CM | POA: Diagnosis not present

## 2017-06-19 DIAGNOSIS — H43813 Vitreous degeneration, bilateral: Secondary | ICD-10-CM | POA: Diagnosis not present

## 2017-06-27 ENCOUNTER — Observation Stay (HOSPITAL_COMMUNITY)
Admission: EM | Admit: 2017-06-27 | Discharge: 2017-06-29 | Disposition: A | Discharge status: Home / Self Care | Payer: Medicare Other | Attending: Internal Medicine | Admitting: Internal Medicine

## 2017-06-27 ENCOUNTER — Other Ambulatory Visit: Payer: Self-pay

## 2017-06-27 ENCOUNTER — Observation Stay (HOSPITAL_COMMUNITY): Payer: Medicare Other

## 2017-06-27 ENCOUNTER — Encounter (HOSPITAL_COMMUNITY): Payer: Self-pay

## 2017-06-27 DIAGNOSIS — E1142 Type 2 diabetes mellitus with diabetic polyneuropathy: Secondary | ICD-10-CM | POA: Insufficient documentation

## 2017-06-27 DIAGNOSIS — Z9842 Cataract extraction status, left eye: Secondary | ICD-10-CM | POA: Insufficient documentation

## 2017-06-27 DIAGNOSIS — Z794 Long term (current) use of insulin: Secondary | ICD-10-CM | POA: Insufficient documentation

## 2017-06-27 DIAGNOSIS — H3530 Unspecified macular degeneration: Secondary | ICD-10-CM | POA: Insufficient documentation

## 2017-06-27 DIAGNOSIS — F331 Major depressive disorder, recurrent, moderate: Secondary | ICD-10-CM | POA: Diagnosis not present

## 2017-06-27 DIAGNOSIS — Z9011 Acquired absence of right breast and nipple: Secondary | ICD-10-CM | POA: Insufficient documentation

## 2017-06-27 DIAGNOSIS — M5432 Sciatica, left side: Secondary | ICD-10-CM | POA: Insufficient documentation

## 2017-06-27 DIAGNOSIS — Z7982 Long term (current) use of aspirin: Secondary | ICD-10-CM | POA: Insufficient documentation

## 2017-06-27 DIAGNOSIS — H409 Unspecified glaucoma: Secondary | ICD-10-CM | POA: Diagnosis not present

## 2017-06-27 DIAGNOSIS — E1100 Type 2 diabetes mellitus with hyperosmolarity without nonketotic hyperglycemic-hyperosmolar coma (NKHHC): Principal | ICD-10-CM | POA: Insufficient documentation

## 2017-06-27 DIAGNOSIS — E785 Hyperlipidemia, unspecified: Secondary | ICD-10-CM | POA: Insufficient documentation

## 2017-06-27 DIAGNOSIS — E1165 Type 2 diabetes mellitus with hyperglycemia: Secondary | ICD-10-CM | POA: Diagnosis not present

## 2017-06-27 DIAGNOSIS — H04123 Dry eye syndrome of bilateral lacrimal glands: Secondary | ICD-10-CM | POA: Diagnosis not present

## 2017-06-27 DIAGNOSIS — Z9221 Personal history of antineoplastic chemotherapy: Secondary | ICD-10-CM | POA: Diagnosis not present

## 2017-06-27 DIAGNOSIS — R0902 Hypoxemia: Secondary | ICD-10-CM | POA: Diagnosis not present

## 2017-06-27 DIAGNOSIS — I5032 Chronic diastolic (congestive) heart failure: Secondary | ICD-10-CM | POA: Insufficient documentation

## 2017-06-27 DIAGNOSIS — R739 Hyperglycemia, unspecified: Secondary | ICD-10-CM

## 2017-06-27 DIAGNOSIS — E1122 Type 2 diabetes mellitus with diabetic chronic kidney disease: Secondary | ICD-10-CM | POA: Diagnosis not present

## 2017-06-27 DIAGNOSIS — Z9114 Patient's other noncompliance with medication regimen: Secondary | ICD-10-CM | POA: Diagnosis not present

## 2017-06-27 DIAGNOSIS — Z961 Presence of intraocular lens: Secondary | ICD-10-CM | POA: Insufficient documentation

## 2017-06-27 DIAGNOSIS — I10 Essential (primary) hypertension: Secondary | ICD-10-CM | POA: Diagnosis not present

## 2017-06-27 DIAGNOSIS — Z79899 Other long term (current) drug therapy: Secondary | ICD-10-CM | POA: Diagnosis not present

## 2017-06-27 DIAGNOSIS — Z9841 Cataract extraction status, right eye: Secondary | ICD-10-CM | POA: Insufficient documentation

## 2017-06-27 DIAGNOSIS — N183 Chronic kidney disease, stage 3 unspecified: Secondary | ICD-10-CM

## 2017-06-27 DIAGNOSIS — I130 Hypertensive heart and chronic kidney disease with heart failure and stage 1 through stage 4 chronic kidney disease, or unspecified chronic kidney disease: Secondary | ICD-10-CM | POA: Diagnosis not present

## 2017-06-27 DIAGNOSIS — Z882 Allergy status to sulfonamides status: Secondary | ICD-10-CM | POA: Insufficient documentation

## 2017-06-27 DIAGNOSIS — Z880 Allergy status to penicillin: Secondary | ICD-10-CM | POA: Diagnosis not present

## 2017-06-27 DIAGNOSIS — E039 Hypothyroidism, unspecified: Secondary | ICD-10-CM | POA: Diagnosis not present

## 2017-06-27 DIAGNOSIS — R531 Weakness: Secondary | ICD-10-CM | POA: Diagnosis not present

## 2017-06-27 DIAGNOSIS — Z888 Allergy status to other drugs, medicaments and biological substances status: Secondary | ICD-10-CM | POA: Diagnosis not present

## 2017-06-27 DIAGNOSIS — G9340 Encephalopathy, unspecified: Secondary | ICD-10-CM | POA: Diagnosis not present

## 2017-06-27 DIAGNOSIS — Z853 Personal history of malignant neoplasm of breast: Secondary | ICD-10-CM | POA: Diagnosis not present

## 2017-06-27 LAB — TSH: TSH: 6.763 u[IU]/mL — ABNORMAL HIGH (ref 0.350–4.500)

## 2017-06-27 LAB — CBC WITH DIFFERENTIAL/PLATELET
Basophils Absolute: 0 10*3/uL (ref 0.0–0.1)
Basophils Relative: 0 %
Eosinophils Absolute: 0 10*3/uL (ref 0.0–0.7)
Eosinophils Relative: 0 %
HCT: 36.5 % (ref 36.0–46.0)
Hemoglobin: 13.2 g/dL (ref 12.0–15.0)
Lymphocytes Relative: 14 %
Lymphs Abs: 1.4 10*3/uL (ref 0.7–4.0)
MCH: 31.3 pg (ref 26.0–34.0)
MCHC: 36.2 g/dL — ABNORMAL HIGH (ref 30.0–36.0)
MCV: 86.5 fL (ref 78.0–100.0)
Monocytes Absolute: 0.4 10*3/uL (ref 0.1–1.0)
Monocytes Relative: 3 %
Neutro Abs: 8.4 10*3/uL — ABNORMAL HIGH (ref 1.7–7.7)
Neutrophils Relative %: 83 %
Platelets: 249 10*3/uL (ref 150–400)
RBC: 4.22 MIL/uL (ref 3.87–5.11)
RDW: 12.2 % (ref 11.5–15.5)
WBC: 10.2 10*3/uL (ref 4.0–10.5)

## 2017-06-27 LAB — COMPREHENSIVE METABOLIC PANEL
ALT: 20 U/L (ref 14–54)
AST: 22 U/L (ref 15–41)
Albumin: 4.4 g/dL (ref 3.5–5.0)
Alkaline Phosphatase: 96 U/L (ref 38–126)
Anion gap: 13 (ref 5–15)
BUN: 22 mg/dL — ABNORMAL HIGH (ref 6–20)
CO2: 24 mmol/L (ref 22–32)
Calcium: 9.6 mg/dL (ref 8.9–10.3)
Chloride: 92 mmol/L — ABNORMAL LOW (ref 101–111)
Creatinine, Ser: 1.2 mg/dL — ABNORMAL HIGH (ref 0.44–1.00)
GFR calc Af Amer: 53 mL/min — ABNORMAL LOW (ref >60)
GFR calc non Af Amer: 46 mL/min — ABNORMAL LOW (ref >60)
Glucose, Bld: 890 mg/dL (ref 65–99)
Potassium: 4 mmol/L (ref 3.5–5.1)
Sodium: 129 mmol/L — ABNORMAL LOW (ref 135–145)
Total Bilirubin: 0.6 mg/dL (ref 0.3–1.2)
Total Protein: 7.8 g/dL (ref 6.5–8.1)

## 2017-06-27 LAB — BLOOD GAS, VENOUS
Acid-base deficit: 0.4 mmol/L (ref 0.0–2.0)
Bicarbonate: 23.2 mmol/L (ref 20.0–28.0)
FIO2: 21
O2 Saturation: 46.1 %
Patient temperature: 98.6
pCO2, Ven: 36.3 mmHg — ABNORMAL LOW (ref 44.0–60.0)
pH, Ven: 7.421 (ref 7.250–7.430)

## 2017-06-27 LAB — CBC
HCT: 39.6 % (ref 36.0–46.0)
Hemoglobin: 14.2 g/dL (ref 12.0–15.0)
MCH: 31.8 pg (ref 26.0–34.0)
MCHC: 35.9 g/dL (ref 30.0–36.0)
MCV: 88.6 fL (ref 78.0–100.0)
Platelets: 249 10*3/uL (ref 150–400)
RBC: 4.47 MIL/uL (ref 3.87–5.11)
RDW: 12.1 % (ref 11.5–15.5)
WBC: 11.4 10*3/uL — ABNORMAL HIGH (ref 4.0–10.5)

## 2017-06-27 LAB — URINALYSIS, ROUTINE W REFLEX MICROSCOPIC
Bacteria, UA: NONE SEEN
Bilirubin Urine: NEGATIVE
Glucose, UA: >=500 mg/dL — AB
Ketones, ur: NEGATIVE mg/dL
Nitrite: NEGATIVE
Protein, ur: NEGATIVE mg/dL
Specific Gravity, Urine: 1.018 (ref 1.005–1.030)
pH: 7 (ref 5.0–8.0)

## 2017-06-27 LAB — CBG MONITORING, ED
Glucose-Capillary: 417 mg/dL — ABNORMAL HIGH (ref 65–99)
Glucose-Capillary: 449 mg/dL — ABNORMAL HIGH (ref 65–99)
Glucose-Capillary: 505 mg/dL (ref 65–99)
Glucose-Capillary: 525 mg/dL (ref 65–99)
Glucose-Capillary: 566 mg/dL (ref 65–99)
Glucose-Capillary: >600 mg/dL (ref 65–99)
Glucose-Capillary: >600 mg/dL (ref 65–99)
Glucose-Capillary: >600 mg/dL (ref 65–99)
Glucose-Capillary: >600 mg/dL (ref 65–99)

## 2017-06-27 LAB — CREATININE, SERUM
Creatinine, Ser: 1 mg/dL (ref 0.44–1.00)
GFR calc Af Amer: >60 mL/min (ref >60)
GFR calc non Af Amer: 57 mL/min — ABNORMAL LOW (ref >60)

## 2017-06-27 MED ORDER — ACETAMINOPHEN 325 MG PO TABS
650.0000 mg | ORAL_TABLET | Freq: Four times a day (QID) | ORAL | Status: DC | PRN
  Filled 2017-06-27: qty 2

## 2017-06-27 MED ORDER — SODIUM CHLORIDE 0.9 % IV SOLN: INTRAVENOUS | Status: DC

## 2017-06-27 MED ORDER — INSULIN REGULAR BOLUS VIA INFUSION
0.0000 [IU] | Freq: Three times a day (TID) | INTRAVENOUS | Status: DC
  Filled 2017-06-27: qty 10

## 2017-06-27 MED ORDER — ONDANSETRON HCL 4 MG/2ML IJ SOLN: 4.0000 mg | Freq: Four times a day (QID) | INTRAMUSCULAR | Status: DC | PRN

## 2017-06-27 MED ORDER — ONDANSETRON HCL 4 MG PO TABS: 4.0000 mg | ORAL_TABLET | Freq: Four times a day (QID) | ORAL | Status: DC | PRN

## 2017-06-27 MED ORDER — ASPIRIN EC 81 MG PO TBEC
81.0000 mg | DELAYED_RELEASE_TABLET | Freq: Every day | ORAL | Status: DC
  Administered 2017-06-28: 81 mg via ORAL
  Filled 2017-06-27: qty 1

## 2017-06-27 MED ORDER — HYDRALAZINE HCL 20 MG/ML IJ SOLN: 10.0000 mg | INTRAMUSCULAR | Status: DC | PRN

## 2017-06-27 MED ORDER — DEXTROSE-NACL 5-0.45 % IV SOLN: INTRAVENOUS | Status: DC

## 2017-06-27 MED ORDER — DEXTROSE 50 % IV SOLN: 25.0000 mL | INTRAVENOUS | Status: DC | PRN

## 2017-06-27 MED ORDER — CALCIUM CARBONATE-VITAMIN D 500-200 MG-UNIT PO TABS
1.0000 | ORAL_TABLET | Freq: Two times a day (BID) | ORAL | Status: DC
  Administered 2017-06-28 – 2017-06-29 (×3): 1 via ORAL
  Filled 2017-06-27 (×3): qty 1

## 2017-06-27 MED ORDER — DEXTROSE-NACL 5-0.45 % IV SOLN
INTRAVENOUS | Status: DC
  Administered 2017-06-28: 04:00:00 via INTRAVENOUS

## 2017-06-27 MED ORDER — BRIMONIDINE TARTRATE 0.15 % OP SOLN
1.0000 [drp] | Freq: Three times a day (TID) | OPHTHALMIC | Status: DC
  Administered 2017-06-28 – 2017-06-29 (×5): 1 [drp] via OPHTHALMIC
  Filled 2017-06-27: qty 5

## 2017-06-27 MED ORDER — ACETAMINOPHEN 650 MG RE SUPP: 650.0000 mg | Freq: Four times a day (QID) | RECTAL | Status: DC | PRN

## 2017-06-27 MED ORDER — SODIUM CHLORIDE 0.9 % IV BOLUS
1000.0000 mL | Freq: Once | INTRAVENOUS | Status: AC
  Administered 2017-06-27: 1000 mL via INTRAVENOUS

## 2017-06-27 MED ORDER — KETOROLAC TROMETHAMINE 0.5 % OP SOLN
1.0000 [drp] | Freq: Three times a day (TID) | OPHTHALMIC | Status: DC
  Administered 2017-06-28 – 2017-06-29 (×5): 1 [drp] via OPHTHALMIC
  Filled 2017-06-27: qty 5

## 2017-06-27 MED ORDER — SODIUM CHLORIDE 0.9 % IV SOLN
INTRAVENOUS | Status: DC
  Administered 2017-06-27 – 2017-06-28 (×3): via INTRAVENOUS
  Administered 2017-06-29: 125 mL/h via INTRAVENOUS

## 2017-06-27 MED ORDER — VENLAFAXINE HCL ER 150 MG PO CP24
150.0000 mg | ORAL_CAPSULE | Freq: Every day | ORAL | Status: DC
  Administered 2017-06-28 – 2017-06-29 (×2): 150 mg via ORAL
  Filled 2017-06-27 (×2): qty 1

## 2017-06-27 MED ORDER — SODIUM CHLORIDE 0.9 % IV SOLN
INTRAVENOUS | Status: DC
  Administered 2017-06-28: 3.6 [IU]/h via INTRAVENOUS
  Filled 2017-06-27 (×2): qty 1

## 2017-06-27 MED ORDER — LORAZEPAM 2 MG/ML IJ SOLN
0.2500 mg | Freq: Once | INTRAMUSCULAR | Status: AC
  Administered 2017-06-27: 0.25 mg via INTRAVENOUS
  Filled 2017-06-27: qty 1

## 2017-06-27 MED ORDER — AMLODIPINE BESYLATE 10 MG PO TABS
10.0000 mg | ORAL_TABLET | Freq: Every day | ORAL | Status: DC
  Administered 2017-06-28 – 2017-06-29 (×2): 10 mg via ORAL
  Filled 2017-06-27 (×2): qty 1

## 2017-06-27 MED ORDER — SODIUM CHLORIDE 0.9 % IV SOLN: INTRAVENOUS | Status: AC

## 2017-06-27 MED ORDER — ENOXAPARIN SODIUM 40 MG/0.4ML ~~LOC~~ SOLN
40.0000 mg | Freq: Every day | SUBCUTANEOUS | Status: DC
  Administered 2017-06-28 – 2017-06-29 (×2): 40 mg via SUBCUTANEOUS
  Filled 2017-06-27 (×3): qty 0.4

## 2017-06-27 NOTE — ED Notes (Signed)
Patient transported to MRI, unable to obtain CBG

## 2017-06-27 NOTE — ED Notes (Signed)
Patient transported to MRI 

## 2017-06-27 NOTE — ED Notes (Addendum)
MRI made aware patient is claustrophobic. Hospitalist paged.

## 2017-06-27 NOTE — ED Triage Notes (Addendum)
PT BIB EMS FOR FEELING FATIGUE AND DIZZY WHILE VISITING HER DAUGHTER AT A HOSPICE FACILITY. INITIAL CBG "HI". PER EMS, FAMILY STS THAT THE PT HAS NOT BEEN COMPLIANT WITH HER MEDICINES, AND HAS NOT TAKEN ANY MEDICINES TODAY. NS IV 1L BOLUS GIVEN PTA. PT AAOX4 IN TRIAGE.

## 2017-06-27 NOTE — ED Provider Notes (Signed)
[facility] DEPT Provider Note   CSN: [id number] Arrival date & time: 06/27/17  1646     History   Chief Complaint Chief Complaint  Patient presents with  . Hyperglycemia    HPI [name] is a 67 y.o. female.  The history is provided by the patient and medical records. No language interpreter was used.  Hyperglycemia  Associated symptoms: abdominal pain, fatigue and weakness   Associated symptoms: no nausea and no vomiting    [name] is a 67 y.o. female  with a PMH of DM who presents to the Emergency Department complaining of hyperglycemia. Patient states that she has been feeling "bad" for about 2 days.  This afternoon, around 2pm, her stomach started hurting and she felt very tired.  EMS was called where CBG was checked and read high.  Patient states that she has not taken any of her diabetic medications in over a week.  She is unable to tell me when the last time that she did take any of her blood sugar medications.  While she does endorse abdominal pain, she denies any nausea or vomiting.  She denies any recent illnesses or fevers.  No cough or congestion.  Denies urinary symptoms.  She was given 1 L of normal saline by EMS in route.  Home regimen per chart review:  - Metformin 1000 mg 2x a day, with meals - Victoza 1.8 mg in am before b'fast  - Levemir 14 units in a.m. - Humalog: she takes 3 units per meal    Past Medical History:  Diagnosis Date  . Arthritis   . Borderline glaucoma of both eyes   . Depression   . Diabetic polyneuropathy ([place])   . Diverticulosis of colon   . Dry eyes, bilateral   . Feeling of incomplete bladder emptying   . History of breast cancer oncologist-  dr Waymon Budge-- per lov note no recurrence   dx 04/ 1999 --- Stage 3B-- s/p  chemotherapy then right mastectomy then concurrent chemoradiation therapy  . History of urinary retention    01/ 2018  . Hydronephrosis of right kidney   . Hyperlipidemia     . Hypertension   . Hypothyroidism   . Insulin dependent type 2 diabetes mellitus [facility])    endocrinologist-  dr Cruzita Lederer---  last A1c 8.7 on 02-20-2016  . Lymphedema of upper extremity    right  . Macular degeneration, left eye    followed by dr Zigmund Daniel  . Renal insufficiency   . Urgency of urination     Patient Active Problem List   Diagnosis Date Noted  . Hyperosmolar non-ketotic state in patient with type 2 diabetes mellitus ([place]) 06/27/2017  . Acute encephalopathy 06/27/2017  . Hydronephrosis 04/01/2017  . Sciatica of left side without back pain 01/30/2017  . Neuropathy 12/09/2016  . Hypokalemia 12/09/2016  . Hypomagnesemia 12/09/2016  . Genetic testing 11/27/2015  . Bilateral hip pain 02/24/2015  . Preventative health care 08/26/2012  . HTN, goal below 140/90 08/13/2012  . CKD (chronic kidney disease) stage 3, GFR 30-59 ml/min (HCC) 08/13/2012  . Glaucoma associated with systemic syndromes(365.44) 08/07/2012  . Hypothyroidism 04/16/2006  . Anemia 04/16/2006  . Depression 04/16/2006  . LOW BACK PAIN 04/16/2006  . BREAST CANCER, HX OF 04/16/2006  . Hyperlipidemia 10/13/2003  . Type 2 diabetes mellitus with diabetic polyneuropathy, with long-term current use of insulin ([place]) 04/16/1991    Past Surgical History:  Procedure Laterality Date  . CARPAL TUNNEL RELEASE Right  2017   and tendon repair  . CATARACT EXTRACTION W/ INTRAOCULAR LENS  IMPLANT, BILATERAL  2017  . CYSTO/  RIGHT RETROGRADE PYELOGRAM/  UNROOFING RIGHT URETEROCELE  09/18/2000  . CYSTOSCOPY/RETROGRADE/URETEROSCOPY Right 05/09/2016   Procedure: CYSTOSCOPY and right RETROGRADE;  Surgeon: Irine Seal, MD;  Location: [facility];  Service: Urology;  Laterality: Right;  . FINGER SURGERY Left x2 prior to 09-09-2014  . I&D EXTREMITY Left 09/09/2014   Procedure: IRRIGATION AND DEBRIDEMENT LEFT THUMB DISTAL PHALANX;  Surgeon: Daryll Brod, MD;  Location: [facility];  Service: Orthopedics;   Laterality: Left;  [name] MASTECTOMY Right 1999   w/  Node dissection's  . PORT-A-CATH REMOVAL  05/01/1999  . TUBAL LIGATION    . VAGINAL HYSTERECTOMY  1970's  . WRIST GANGLION EXCISION Right 2016     OB History   None      Home Medications    Prior to Admission medications   Medication Sig Start Date End Date Taking? Authorizing Provider  acetaminophen (MAPAP) 325 MG tablet Take 2 tablets (650 mg total) by mouth every 6 (six) hours as needed for moderate pain (Do not take more than 8 tablets per day). 05/07/17  Yes Narendra, Nischal, MD  ALPHAGAN P 0.1 % SOLN Place 1 drop into both eyes 3 (three) times daily. 06/09/17  Yes [provider]  amLODipine (NORVASC) 10 MG tablet TAKE 1 TABLET(10 MG) BY MOUTH DAILY 02/05/17  Yes Aldine Contes, MD  aspirin EC 81 MG tablet Take 81 mg by mouth at bedtime.   Yes [provider]  calcium citrate-vitamin D (CITRACAL+D) 315-200 MG-UNIT tablet Take 1 tablet by mouth 2 (two) times daily. 06/06/16 06/27/17 Yes Aldine Contes, MD  Cholecalciferol (VITAMIN D3) 2000 units TABS Take 2,000 Units at bedtime by mouth.    Yes [provider]  glucose 4 GM chewable tablet Chew 4 tablets (16 g total) by mouth as needed for low blood sugar. 05/28/12  Yes Paya, Cletus Gash, DO  glucose blood (ONETOUCH VERIO) test strip USE TO CHECK BLOOD SUGAR TWICE DAILY 09/10/16  Yes Aldine Contes, MD  hydrochlorothiazide (MICROZIDE) 12.5 MG capsule TAKE 1 CAPSULE BY MOUTH EVERY DAY 04/14/17  Yes Aldine Contes, MD  Insulin Pen Needle (BD PEN NEEDLE NANO U/F) 32G X 4 MM MISC USE AS DIRECTED 03/19/17  Yes Aldine Contes, MD  ketorolac (ACULAR) 0.5 % ophthalmic solution Place 1 drop into both eyes 3 (three) times daily. 06/11/17  Yes [provider]  liraglutide (VICTOZA) 18 MG/3ML SOPN ADMINISTER 1.8 MG UNDER THE SKIN DAILY 03/14/17  Yes Philemon Kingdom, MD  metFORMIN (GLUCOPHAGE) 1000 MG tablet TAKE 1 TABLET(1000 MG) BY MOUTH TWICE DAILY WITH  A MEAL Patient taking differently: Take 1,000 mg 2 (two) times daily with a meal by mouth.  09/11/16  Yes Shela Leff, MD  Olopatadine HCl (PATADAY) 0.2 % SOLN Place 1 drop into both eyes daily as needed (dry eyes).    Yes [provider]  [facility] DELICA LANCETS FINE MISC USE TO CHECK BLOOD SUGAR TWICE DAILY 06/02/17  Yes Aldine Contes, MD  Polyethyl Glycol-Propyl Glycol (SYSTANE) 0.4-0.3 % GEL ophthalmic gel Place 1 application daily as needed into both eyes (dry eyes/irritation).    Yes [provider]  repaglinide (PRANDIN) 0.5 MG tablet Take 0.5 mg by mouth daily with supper. 04/12/17  Yes [provider]  venlafaxine XR (EFFEXOR-XR) 150 MG 24 hr capsule TAKE 1 CAPSULE BY MOUTH EVERY DAY WITH BREAKFAST 04/14/17  Yes Narendra, Nischal,  MD  Insulin Detemir (LEVEMIR FLEXTOUCH) 100 UNIT/ML Pen Inject 18 Units into the skin daily before breakfast. Patient not taking: Reported on 06/27/2017 04/29/17   Philemon Kingdom, MD  insulin lispro (HUMALOG KWIKPEN) 100 UNIT/ML KiwkPen Inject 0.05-0.07 mLs (5-7 Units total) into the skin 3 (three) times daily. Patient not taking: Reported on 06/27/2017 04/29/17   Philemon Kingdom, MD  levothyroxine (SYNTHROID, LEVOTHROID) 88 MCG tablet TAKE 1 TABLET BY MOUTH EVERY MORNING BEFORE BREAKFAST Patient not taking: Reported on 06/27/2017 04/14/17   Aldine Contes, MD  pregabalin (LYRICA) 50 MG capsule Take 1 capsule (50 mg total) by mouth 3 (three) times daily. Patient not taking: Reported on 06/27/2017 04/01/17 04/01/18  Aldine Contes, MD  rosuvastatin (CRESTOR) 20 MG tablet TAKE 1 TABLET(20 MG) BY MOUTH DAILY Patient not taking: Reported on 06/27/2017 04/14/17   Aldine Contes, MD    Family History Family History  Problem Relation Age of Onset  . Heart disease Father   . Diabetes Father   . Stroke Sister   . Anuerysm Sister 20       brain; maternal half-sister  . Heart disease Brother   . Anuerysm Brother 87       aortic;  maternal half-brother  . Breast cancer Daughter 91       negative genetic testing in 2015  . Heart attack Daughter        71-47  . Diabetes Paternal Grandmother   . Stroke Paternal Grandfather   . Anuerysm Brother        NOS type; full brother  . Fibroids Daughter        s/p hysterectomy at 61y  . Brain cancer Maternal Uncle        dx. older than 23; NOS type  . Brain cancer Cousin        maternal 1st cousin; d. early 40s; NOS type  . Deafness Paternal Uncle        prelingual    Social History Social History   Tobacco Use  . Smoking status: Never Smoker  . Smokeless tobacco: Never Used  Substance Use Topics  . Alcohol use: No    Alcohol/week: 0.0 oz  . Drug use: No     Allergies   Gabapentin; Penicillins; and Sulfa antibiotics   Review of Systems Review of Systems  Constitutional: Positive for fatigue.  Gastrointestinal: Positive for abdominal pain. Negative for nausea and vomiting.  Neurological: Positive for weakness.  All other systems reviewed and are negative.    Physical Exam Updated Vital Signs BP (!) 168/83   Pulse 74   Temp 98.5 F (36.9 C) (Oral)   Resp 19   Ht 5\' 6"  (1.676 m)   Wt 59 kg (130 lb)   SpO2 100%   BMI 20.98 kg/m   Physical Exam  Constitutional: She is oriented to person, place, and time. She appears well-developed and well-nourished. No distress.  Somnolent, but arousable. Answering all questions appropriately and in timely fashion.  HENT:  Head: Normocephalic and atraumatic.  Cardiovascular: Normal rate, regular rhythm and normal heart sounds.  No murmur heard. Pulmonary/Chest: Effort normal and breath sounds normal. No respiratory distress.  Abdominal: Soft. Bowel sounds are normal. She exhibits no distension.  Diffuse abdominal tenderness without rebound or guarding.  Neurological: She is alert and oriented to person, place, and time.  Skin: Skin is warm and dry.  Nursing note and vitals reviewed.    ED Treatments /  Results  Labs (all labs ordered are listed, but  only abnormal results are displayed) Labs Reviewed  URINALYSIS, ROUTINE W REFLEX MICROSCOPIC - Abnormal; Notable for the following components:      Result Value   Color, Urine COLORLESS (*)    Glucose, UA >=500 (*)    Hgb urine dipstick MODERATE (*)    Leukocytes, UA TRACE (*)    All other components within normal limits  CBC WITH DIFFERENTIAL/PLATELET - Abnormal; Notable for the following components:   MCHC 36.2 (*)    Neutro Abs 8.4 (*)    All other components within normal limits  COMPREHENSIVE METABOLIC PANEL - Abnormal; Notable for the following components:   Sodium 129 (*)    Chloride 92 (*)    Glucose, Bld 890 (*)    BUN 22 (*)    Creatinine, Ser 1.20 (*)    GFR calc non Af Amer 46 (*)    GFR calc Af Amer 53 (*)    All other components within normal limits  BLOOD GAS, VENOUS - Abnormal; Notable for the following components:   pCO2, Ven 36.3 (*)    All other components within normal limits  CBG MONITORING, ED - Abnormal; Notable for the following components:   Glucose-Capillary >600 (*)    All other components within normal limits  CBG MONITORING, ED - Abnormal; Notable for the following components:   Glucose-Capillary >600 (*)    All other components within normal limits  CBG MONITORING, ED - Abnormal; Notable for the following components:   Glucose-Capillary >600 (*)    All other components within normal limits  CBG MONITORING, ED - Abnormal; Notable for the following components:   Glucose-Capillary >600 (*)    All other components within normal limits  HIV ANTIBODY (ROUTINE TESTING)  BASIC METABOLIC PANEL  CBC  CBC  CREATININE, SERUM  BASIC METABOLIC PANEL  BASIC METABOLIC PANEL  BASIC METABOLIC PANEL  TSH  HEMOGLOBIN A1C  I-STAT CHEM 8, ED    EKG None  Radiology No results found.  Procedures Procedures (including critical care time)  CRITICAL CARE Performed by: Ozella Almond Le Faulcon  Total critical  care time: 35 minutes  Critical care time was exclusive of separately billable procedures and treating other patients.  Critical care was necessary to treat or prevent imminent or life-threatening deterioration.  Critical care was time spent personally by me on the following activities: development of treatment plan with patient and/or surrogate as well as nursing, discussions with consultants, evaluation of patient's response to treatment, examination of patient, obtaining history from patient or surrogate, ordering and performing treatments and interventions, ordering and review of laboratory studies, ordering and review of radiographic studies, pulse oximetry and re-evaluation of patient's condition.   Medications Ordered in ED Medications  sodium chloride 0.9 % bolus 1,000 mL (1,000 mLs Intravenous New Bag/Given 06/27/17 1924)  brimonidine (ALPHAGAN) 0.15 % ophthalmic solution 1 drop (has no administration in time range)  amLODipine (NORVASC) tablet 10 mg (has no administration in time range)  aspirin EC tablet 81 mg (has no administration in time range)  calcium citrate-vitamin D (CITRACAL+D) 315-200 MG-UNIT per tablet 1 tablet (has no administration in time range)  ketorolac (ACULAR) 0.5 % ophthalmic solution 1 drop (has no administration in time range)  venlafaxine XR (EFFEXOR-XR) 24 hr capsule 150 mg (has no administration in time range)  acetaminophen (TYLENOL) tablet 650 mg (has no administration in time range)    Or  acetaminophen (TYLENOL) suppository 650 mg (has no administration in time range)  ondansetron (ZOFRAN) tablet 4 mg (  has no administration in time range)    Or  ondansetron (ZOFRAN) injection 4 mg (has no administration in time range)  enoxaparin (LOVENOX) injection 40 mg (has no administration in time range)  0.9 %  sodium chloride infusion (has no administration in time range)  dextrose 5 %-0.45 % sodium chloride infusion (has no administration in time range)    insulin regular bolus via infusion 0-10 Units (has no administration in time range)  insulin regular (NOVOLIN R,HUMULIN R) 100 Units in sodium chloride 0.9 % 100 mL (1 Units/mL) infusion (has no administration in time range)  dextrose 50 % solution 25 mL (has no administration in time range)  0.9 %  sodium chloride infusion (has no administration in time range)  hydrALAZINE (APRESOLINE) injection 10 mg (has no administration in time range)  sodium chloride 0.9 % bolus 1,000 mL (0 mLs Intravenous Stopped 06/27/17 1910)     Initial Impression / Assessment and Plan / ED Course  I have reviewed the triage vital signs and the nursing notes.  Pertinent labs & imaging results that were available during my care of the patient were reviewed by me and considered in my medical decision making (see chart for details).    [name] is a 67 y.o. female who presents to ED for weakness, fatigue, blood sugars reading "high" at home.  Glucose of 890.  No ketones in her urine with normal anion gap and CO2 of 24.  One fluid bolus given by EMS in route.  To further given while in the emergency department.  On reevaluation, patient does appear much more alert and feels improved.  Given significant elevation in glucose, hospitalist consulted who will evaluate for admission.  Hospitalist recommends glucose stabilizer which was started and will admit.  Patient discussed with Dr. Tyrone Nine who agrees with treatment plan.     Final Clinical Impressions(s) / ED Diagnoses   Final diagnoses:  Hyperglycemia    ED Discharge Orders    None       Jansen Sciuto, Ozella Almond, PA-C 06/27/17 2004    Deno Etienne, DO 06/27/17 2008

## 2017-06-27 NOTE — ED Notes (Signed)
Respiratory made aware VBG in mini lab.

## 2017-06-28 ENCOUNTER — Other Ambulatory Visit: Payer: Self-pay

## 2017-06-28 ENCOUNTER — Observation Stay (HOSPITAL_BASED_OUTPATIENT_CLINIC_OR_DEPARTMENT_OTHER): Payer: Medicare Other

## 2017-06-28 DIAGNOSIS — E1100 Type 2 diabetes mellitus with hyperosmolarity without nonketotic hyperglycemic-hyperosmolar coma (NKHHC): Secondary | ICD-10-CM

## 2017-06-28 DIAGNOSIS — I10 Essential (primary) hypertension: Secondary | ICD-10-CM

## 2017-06-28 DIAGNOSIS — F331 Major depressive disorder, recurrent, moderate: Secondary | ICD-10-CM | POA: Diagnosis not present

## 2017-06-28 DIAGNOSIS — Z9114 Patient's other noncompliance with medication regimen: Secondary | ICD-10-CM | POA: Diagnosis not present

## 2017-06-28 DIAGNOSIS — E039 Hypothyroidism, unspecified: Secondary | ICD-10-CM | POA: Diagnosis not present

## 2017-06-28 DIAGNOSIS — E1122 Type 2 diabetes mellitus with diabetic chronic kidney disease: Secondary | ICD-10-CM | POA: Diagnosis not present

## 2017-06-28 LAB — CBC
HCT: 33.5 % — ABNORMAL LOW (ref 36.0–46.0)
Hemoglobin: 11.9 g/dL — ABNORMAL LOW (ref 12.0–15.0)
MCH: 30.8 pg (ref 26.0–34.0)
MCHC: 35.5 g/dL (ref 30.0–36.0)
MCV: 86.8 fL (ref 78.0–100.0)
Platelets: 256 10*3/uL (ref 150–400)
RBC: 3.86 MIL/uL — ABNORMAL LOW (ref 3.87–5.11)
RDW: 12.2 % (ref 11.5–15.5)
WBC: 9.4 10*3/uL (ref 4.0–10.5)

## 2017-06-28 LAB — GLUCOSE, CAPILLARY
Glucose-Capillary: 207 mg/dL — ABNORMAL HIGH (ref 65–99)
Glucose-Capillary: 222 mg/dL — ABNORMAL HIGH (ref 65–99)
Glucose-Capillary: 202 mg/dL — ABNORMAL HIGH (ref 65–99)
Glucose-Capillary: 226 mg/dL — ABNORMAL HIGH (ref 65–99)

## 2017-06-28 LAB — BASIC METABOLIC PANEL
Anion gap: 7 (ref 5–15)
Anion gap: 10 (ref 5–15)
Anion gap: 9 (ref 5–15)
BUN: 15 mg/dL (ref 6–20)
BUN: 14 mg/dL (ref 6–20)
BUN: 14 mg/dL (ref 6–20)
CO2: 26 mmol/L (ref 22–32)
CO2: 23 mmol/L (ref 22–32)
CO2: 24 mmol/L (ref 22–32)
Calcium: 9.2 mg/dL (ref 8.9–10.3)
Calcium: 9 mg/dL (ref 8.9–10.3)
Calcium: 9.2 mg/dL (ref 8.9–10.3)
Chloride: 107 mmol/L (ref 101–111)
Chloride: 105 mmol/L (ref 101–111)
Chloride: 107 mmol/L (ref 101–111)
Creatinine, Ser: 0.9 mg/dL (ref 0.44–1.00)
Creatinine, Ser: 0.87 mg/dL (ref 0.44–1.00)
Creatinine, Ser: 0.83 mg/dL (ref 0.44–1.00)
GFR calc Af Amer: >60 mL/min (ref >60)
GFR calc Af Amer: >60 mL/min (ref >60)
GFR calc Af Amer: >60 mL/min (ref >60)
GFR calc non Af Amer: >60 mL/min (ref >60)
GFR calc non Af Amer: >60 mL/min (ref >60)
GFR calc non Af Amer: >60 mL/min (ref >60)
Glucose, Bld: 309 mg/dL — ABNORMAL HIGH (ref 65–99)
Glucose, Bld: 272 mg/dL — ABNORMAL HIGH (ref 65–99)
Glucose, Bld: 211 mg/dL — ABNORMAL HIGH (ref 65–99)
Potassium: 3 mmol/L — ABNORMAL LOW (ref 3.5–5.1)
Potassium: 3.2 mmol/L — ABNORMAL LOW (ref 3.5–5.1)
Potassium: 3.4 mmol/L — ABNORMAL LOW (ref 3.5–5.1)
Sodium: 140 mmol/L (ref 135–145)
Sodium: 138 mmol/L (ref 135–145)
Sodium: 140 mmol/L (ref 135–145)

## 2017-06-28 LAB — ECHOCARDIOGRAM COMPLETE
Height: 62 in
Weight: 1978.85 oz

## 2017-06-28 LAB — TROPONIN I: Troponin I: 0.04 ng/mL (ref <0.03)

## 2017-06-28 LAB — HEMOGLOBIN A1C
Hgb A1c MFr Bld: 12.8 % — ABNORMAL HIGH (ref 4.8–5.6)
Mean Plasma Glucose: 320.66 mg/dL

## 2017-06-28 LAB — CBG MONITORING, ED
Glucose-Capillary: 273 mg/dL — ABNORMAL HIGH (ref 65–99)
Glucose-Capillary: 334 mg/dL — ABNORMAL HIGH (ref 65–99)
Glucose-Capillary: 214 mg/dL — ABNORMAL HIGH (ref 65–99)

## 2017-06-28 LAB — HIV ANTIBODY (ROUTINE TESTING W REFLEX): HIV Screen 4th Generation wRfx: NONREACTIVE

## 2017-06-28 MED ORDER — INSULIN ASPART 100 UNIT/ML ~~LOC~~ SOLN
0.0000 [IU] | SUBCUTANEOUS | Status: DC
  Administered 2017-06-28: 5 [IU] via SUBCUTANEOUS
  Administered 2017-06-28: 2 [IU] via SUBCUTANEOUS
  Administered 2017-06-28 (×3): 3 [IU] via SUBCUTANEOUS
  Administered 2017-06-29: 7 [IU] via SUBCUTANEOUS
  Administered 2017-06-29: 2 [IU] via SUBCUTANEOUS
  Administered 2017-06-29: 1 [IU] via SUBCUTANEOUS
  Administered 2017-06-29: 9 [IU] via SUBCUTANEOUS
  Filled 2017-06-28: qty 1

## 2017-06-28 MED ORDER — INSULIN DETEMIR 100 UNIT/ML ~~LOC~~ SOLN
18.0000 [IU] | Freq: Every day | SUBCUTANEOUS | Status: DC
  Administered 2017-06-28 – 2017-06-29 (×2): 18 [IU] via SUBCUTANEOUS
  Filled 2017-06-28 (×2): qty 0.18

## 2017-06-28 NOTE — Progress Notes (Signed)
[name] is a 67 y.o. female with history of diabetes mellitus type 2, hypertension, hypothyroidism, depression and hyperlipidemia presents to the ER because she has been feeling dizzy and not feeling well last 2 to 3 days.    06/28/2017: Patient seen and examined at bedside.  She has no new complaints.  She denies dizziness, Chest pain, dyspnea or palpitations.  Please refer to H&P dictated by Dr. Hal Gardner on 06/28/2017 for further details of the assessment and plan.

## 2017-06-28 NOTE — ED Notes (Signed)
ED TO INPATIENT HANDOFF REPORT  Name/Age/Gender [name] 67 y.o. female  Code Status    Code Status Orders  (From admission, onward)        Start     Ordered   06/27/17 1951  Full code  Continuous     06/27/17 1952    Code Status History    Date Active Date Inactive Code Status Order ID Comments User Context   02/19/2016 2325 02/21/2016 2118 Full Code [id number]  Jule Ser, DO ED    Advance Directive Documentation     Most Recent Value  Type of Advance Directive  Living will  Pre-existing out of facility DNR order (yellow form or pink MOST form)  -  "MOST" Form in Place?  -      Home/SNF/Other Home  Chief Complaint hyperglycemia   Level of Care/Admitting Diagnosis ED Disposition    ED Disposition Condition [place]: [place] [100102]  Level of Care: Telemetry [5]  Admit to tele based on following criteria: Monitor QTC interval  Diagnosis: Hyperosmolar non-ketotic state in patient with type 2 diabetes mellitus [facility]) [[id number]]  Admitting Physician: Rise Patience [phone]  Attending Physician: Rise Patience Lei.Right  PT Class (Do Not Modify): Observation [104]  PT Acc Code (Do Not Modify): Observation [10022]       Medical History Past Medical History:  Diagnosis Date  . Arthritis   . Borderline glaucoma of both eyes   . Depression   . Diabetic polyneuropathy ([place])   . Diverticulosis of colon   . Dry eyes, bilateral   . Feeling of incomplete bladder emptying   . History of breast cancer oncologist-  dr Waymon Budge-- per lov note no recurrence   dx 04/ 1999 --- Stage 3B-- s/p  chemotherapy then right mastectomy then concurrent chemoradiation therapy  . History of urinary retention    01/ 2018  . Hydronephrosis of right kidney   . Hyperlipidemia   . Hypertension   . Hypothyroidism   . Insulin dependent type 2 diabetes mellitus [facility])    endocrinologist-  dr Cruzita Lederer---  last A1c 8.7 on  02-20-2016  . Lymphedema of upper extremity    right  . Macular degeneration, left eye    followed by dr Zigmund Daniel  . Renal insufficiency   . Urgency of urination     Allergies Allergies  Allergen Reactions  . Gabapentin Other (See Comments)    Dizziness from 300 mg, tolerates 100 mg  . Penicillins Rash    Has patient had a PCN reaction causing immediate rash, facial/tongue/throat swelling, SOB or lightheadedness with hypotension: Yes Has patient had a PCN reaction causing severe rash involving mucus membranes or skin necrosis: No Has patient had a PCN reaction that required hospitalization: pt was in hospital at time of reaction Has patient had a PCN reaction occurring within the last 10 years: No If all of the above answers are "NO", then may proceed with Cephalosporin use.  . Sulfa Antibiotics Rash    IV Location/Drains/Wounds Patient Lines/Drains/Airways Status   Active Line/Drains/Airways    Name:   Placement date:   Placement time:   Site:   Days:   Peripheral IV 06/27/17 Left Antecubital   06/27/17    -    Antecubital   1   External Urinary Catheter   06/27/17    1725    -   1   Incision (Closed) 09/09/14 Hand Left   09/09/14  1625     1023   Incision (Closed) 05/09/16 Vagina   05/09/16    1108     415          Labs/Imaging Results for orders placed or performed during the hospital encounter of 06/27/17 (from the past 48 hour(s))  CBG monitoring, ED     Status: Abnormal   Collection Time: 06/27/17  5:11 PM  Result Value Ref Range   Glucose-Capillary >600 (HH) 65 - 99 mg/dL  CBG monitoring, ED     Status: Abnormal   Collection Time: 06/27/17  5:13 PM  Result Value Ref Range   Glucose-Capillary >600 (HH) 65 - 99 mg/dL  Urinalysis, Routine w reflex microscopic     Status: Abnormal   Collection Time: 06/27/17  5:25 PM  Result Value Ref Range   Color, Urine COLORLESS (A) YELLOW   APPearance CLEAR CLEAR   Specific Gravity, Urine 1.018 1.005 - 1.030   pH 7.0 5.0 -  8.0   Glucose, UA >=500 (A) NEGATIVE mg/dL   Hgb urine dipstick MODERATE (A) NEGATIVE   Bilirubin Urine NEGATIVE NEGATIVE   Ketones, ur NEGATIVE NEGATIVE mg/dL   Protein, ur NEGATIVE NEGATIVE mg/dL   Nitrite NEGATIVE NEGATIVE   Leukocytes, UA TRACE (A) NEGATIVE   WBC, UA 0-5 0 - 5 WBC/hpf   Bacteria, UA NONE SEEN NONE SEEN    Comment: Performed at [facility], [place] [address], [place], [place] [address]  CBC with Differential     Status: Abnormal   Collection Time: 06/27/17  5:42 PM  Result Value Ref Range   WBC 10.2 4.0 - 10.5 K/uL   RBC 4.22 3.87 - 5.11 MIL/uL   Hemoglobin 13.2 12.0 - 15.0 g/dL   HCT 36.5 36.0 - 46.0 %   MCV 86.5 78.0 - 100.0 fL   MCH 31.3 26.0 - 34.0 pg   MCHC 36.2 (H) 30.0 - 36.0 g/dL   RDW 12.2 11.5 - 15.5 %   Platelets 249 150 - 400 K/uL   Neutrophils Relative % 83 %   Neutro Abs 8.4 (H) 1.7 - 7.7 K/uL   Lymphocytes Relative 14 %   Lymphs Abs 1.4 0.7 - 4.0 K/uL   Monocytes Relative 3 %   Monocytes Absolute 0.4 0.1 - 1.0 K/uL   Eosinophils Relative 0 %   Eosinophils Absolute 0.0 0.0 - 0.7 K/uL   Basophils Relative 0 %   Basophils Absolute 0.0 0.0 - 0.1 K/uL    Comment: Performed at [facility], [place] [address] [place], [place] [address]  Comprehensive metabolic panel     Status: Abnormal   Collection Time: 06/27/17  5:42 PM  Result Value Ref Range   Sodium 129 (L) 135 - 145 mmol/L   Potassium 4.0 3.5 - 5.1 mmol/L   Chloride 92 (L) 101 - 111 mmol/L   CO2 24 22 - 32 mmol/L   Glucose, Bld 890 (HH) 65 - 99 mg/dL    Comment: CRITICAL RESULT CALLED TO, READ BACK BY AND VERIFIED WITH: R BARHAM,RN [id number] @ 1838 BY J SCOTTON    BUN 22 (H) 6 - 20 mg/dL   Creatinine, Ser 1.20 (H) 0.44 - 1.00 mg/dL   Calcium 9.6 8.9 - 10.3 mg/dL   Total Protein 7.8 6.5 - 8.1 g/dL   Albumin 4.4 3.5 - 5.0 g/dL   AST 22 15 - 41 U/L   ALT 20 14 - 54 U/L   Alkaline Phosphatase 96 38 - 126 U/L  Total Bilirubin 0.6 0.3 - 1.2 mg/dL   GFR  calc non Af Amer 46 (L) >60 mL/min   GFR calc Af Amer 53 (L) >60 mL/min    Comment: (NOTE) The eGFR has been calculated using the CKD EPI equation. This calculation has not been validated in all clinical situations. eGFR's persistently <60 mL/min signify possible Chronic Kidney Disease.    Anion gap 13 5 - 15    Comment: Performed at [facility], [place] [address], [place], [place] [address]  CBG monitoring, ED     Status: Abnormal   Collection Time: 06/27/17  6:46 PM  Result Value Ref Range   Glucose-Capillary >600 (HH) 65 - 99 mg/dL  CBG monitoring, ED     Status: Abnormal   Collection Time: 06/27/17  6:48 PM  Result Value Ref Range   Glucose-Capillary >600 (HH) 65 - 99 mg/dL  Blood gas, venous     Status: Abnormal   Collection Time: 06/27/17  7:15 PM  Result Value Ref Range   FIO2 21.00    Delivery systems ROOM AIR    pH, Ven 7.421 7.250 - 7.430   pCO2, Ven 36.3 (L) 44.0 - 60.0 mmHg   pO2, Ven BELOW REPORTABLE RANGE 32.0 - 45.0 mmHg    Comment: CRITICAL RESULT CALLED TO, READ BACK BY AND VERIFIED WITH: Bluford Main, RN AT 1928 BY JESSICA NEUGENT,RRT,RCP ON 06/27/17    Bicarbonate 23.2 20.0 - 28.0 mmol/L   Acid-base deficit 0.4 0.0 - 2.0 mmol/L   O2 Saturation 46.1 %   Patient temperature 98.6    Collection site VEIN    Drawn by COLLECTED BY NURSE    Sample type VENOUS     Comment: Performed at [facility], [place] [address], [place], [place] [address]  CBC     Status: Abnormal   Collection Time: 06/27/17  8:32 PM  Result Value Ref Range   WBC 11.4 (H) 4.0 - 10.5 K/uL   RBC 4.47 3.87 - 5.11 MIL/uL   Hemoglobin 14.2 12.0 - 15.0 g/dL   HCT 39.6 36.0 - 46.0 %   MCV 88.6 78.0 - 100.0 fL   MCH 31.8 26.0 - 34.0 pg   MCHC 35.9 30.0 - 36.0 g/dL   RDW 12.1 11.5 - 15.5 %   Platelets 249 150 - 400 K/uL    Comment: Performed at [facility], [place] [address], [place], [place] [address]  Creatinine, serum     Status: Abnormal    Collection Time: 06/27/17  8:32 PM  Result Value Ref Range   Creatinine, Ser 1.00 0.44 - 1.00 mg/dL   GFR calc non Af Amer 57 (L) >60 mL/min   GFR calc Af Amer >60 >60 mL/min    Comment: (NOTE) The eGFR has been calculated using the CKD EPI equation. This calculation has not been validated in all clinical situations. eGFR's persistently <60 mL/min signify possible Chronic Kidney Disease. Performed at [facility], [place] [address], [place], [place] [address]   CBG monitoring, ED     Status: Abnormal   Collection Time: 06/27/17  8:38 PM  Result Value Ref Range   Glucose-Capillary 566 (HH) 65 - 99 mg/dL   Comment 1 Repeat Test   TSH     Status: Abnormal   Collection Time: 06/27/17  8:40 PM  Result Value Ref Range   TSH 6.763 (H) 0.350 - 4.500 uIU/mL    Comment: Performed by a 3rd Generation assay with a  functional sensitivity of <=0.01 uIU/mL. Performed at [facility], [place] [address], [place], [place] [address]   CBG monitoring, ED     Status: Abnormal   Collection Time: 06/27/17  8:40 PM  Result Value Ref Range   Glucose-Capillary 505 (HH) 65 - 99 mg/dL  CBG monitoring, ED     Status: Abnormal   Collection Time: 06/27/17 10:12 PM  Result Value Ref Range   Glucose-Capillary 525 (HH) 65 - 99 mg/dL  CBG monitoring, ED     Status: Abnormal   Collection Time: 06/27/17 11:33 PM  Result Value Ref Range   Glucose-Capillary 449 (H) 65 - 99 mg/dL  CBG monitoring, ED     Status: Abnormal   Collection Time: 06/27/17 11:52 PM  Result Value Ref Range   Glucose-Capillary 417 (H) 65 - 99 mg/dL  CBG monitoring, ED     Status: Abnormal   Collection Time: 06/28/17  1:39 AM  Result Value Ref Range   Glucose-Capillary 334 (H) 65 - 99 mg/dL  CBG monitoring, ED     Status: Abnormal   Collection Time: 06/28/17  2:03 AM  Result Value Ref Range   Glucose-Capillary 273 (H) 65 - 99 mg/dL  Basic metabolic panel     Status: Abnormal   Collection Time:  06/28/17  2:40 AM  Result Value Ref Range   Sodium 140 135 - 145 mmol/L    Comment: DELTA CHECK NOTED   Potassium 3.0 (L) 3.5 - 5.1 mmol/L    Comment: DELTA CHECK NOTED   Chloride 107 101 - 111 mmol/L   CO2 26 22 - 32 mmol/L   Glucose, Bld 309 (H) 65 - 99 mg/dL   BUN 15 6 - 20 mg/dL   Creatinine, Ser 0.90 0.44 - 1.00 mg/dL   Calcium 9.2 8.9 - 10.3 mg/dL   GFR calc non Af Amer >60 >60 mL/min   GFR calc Af Amer >60 >60 mL/min    Comment: (NOTE) The eGFR has been calculated using the CKD EPI equation. This calculation has not been validated in all clinical situations. eGFR's persistently <60 mL/min signify possible Chronic Kidney Disease.    Anion gap 7 5 - 15    Comment: Performed at [facility], [place] [address] [place], [place] [address]   Mr Brain Wo Contrast  Result Date: 06/27/2017 CLINICAL DATA:  Initial evaluation for acute altered mental status. EXAM: MRI HEAD WITHOUT CONTRAST TECHNIQUE: Multiplanar, multiecho pulse sequences of the brain and surrounding structures were obtained without intravenous contrast. COMPARISON:  Prior CT from 04/01/2017 as well as previous brain MRI from 10/08/2006. FINDINGS: Brain: Age-appropriate cerebral volume. Minimal T2/FLAIR hyperintensity within the periventricular and deep white matter both cerebral hemispheres, most like related chronic small vessel ischemic change, felt to be within normal limits for age. No evidence for acute infarct. Gray-white matter differentiation maintained. No areas of chronic infarction. No acute or chronic intracranial hemorrhage. No mass lesion, midline shift or mass effect. No hydrocephalus. No extra-axial fluid collection. Major dural sinuses grossly patent. Pituitary gland suprasellar region normal. Midline structures intact and normal. Vascular: Major intracranial vascular flow voids are maintained. Skull and upper cervical spine: Craniocervical junction normal. Upper cervical spine normal. Bone  marrow signal intensity within normal limits. Approximate 12 mm lesion at the right frontal scalp, indeterminate, but grossly similar as compared to previous MRI from 2008, likely benign. Sinuses/Orbits: Globes and orbital soft tissues within normal limits. Patient status post lens extraction bilaterally. Paranasal sinuses are clear.  No significant mastoid effusion. Inner ear structures normal. Other: None. IMPRESSION: 1. Normal brain MRI for age. No acute intracranial abnormality identified. 2. Approximate 12 mm right frontal scalp lesion, indeterminate, but grossly similar as compared to previous MRI from 2008, likely benign. Correlation with physical exam recommended. Electronically Signed   By: Jeannine Boga M.D.   On: 06/27/2017 22:14    Pending Labs Unresulted Labs (From admission, onward)   Start     Ordered   07/04/17 0500  Creatinine, serum  (enoxaparin (LOVENOX)    CrCl >/= 30 ml/min)  Weekly,   R    Comments:  while on enoxaparin therapy    06/27/17 1952   06/28/17 6546  Basic metabolic panel  Tomorrow morning,   R     06/27/17 1952   06/28/17 0500  CBC  Tomorrow morning,   R     06/27/17 1952   06/27/17 5035  Basic metabolic panel  Now then every 4 hours,   R     06/27/17 1952   06/27/17 2300  Hemoglobin A1c  Once,   R     06/27/17 1954   06/27/17 1951  HIV antibody (Routine Testing)  Once,   R     06/27/17 1952      Vitals/Pain Today's Vitals   06/27/17 2240 06/28/17 0000 06/28/17 0100 06/28/17 0234  BP: (!) 137/91 (!) 145/89 (!) 142/83 138/86  Pulse: 77 79 80 72  Resp: 12 10 15 16   Temp:      TempSrc:      SpO2: 100% 100% 100% 98%  Weight:      Height:      PainSc:        Isolation Precautions No active isolations  Medications Medications  brimonidine (ALPHAGAN) 0.15 % ophthalmic solution 1 drop (1 drop Both Eyes Given 06/28/17 0405)  amLODipine (NORVASC) tablet 10 mg (has no administration in time range)  aspirin EC tablet 81 mg (0 mg Oral Hold 06/28/17  0401)  calcium-vitamin D (OSCAL WITH D) 500-200 MG-UNIT per tablet 1 tablet (has no administration in time range)  ketorolac (ACULAR) 0.5 % ophthalmic solution 1 drop (1 drop Both Eyes Given 06/28/17 0408)  venlafaxine XR (EFFEXOR-XR) 24 hr capsule 150 mg (has no administration in time range)  acetaminophen (TYLENOL) tablet 650 mg (has no administration in time range)    Or  acetaminophen (TYLENOL) suppository 650 mg (has no administration in time range)  ondansetron (ZOFRAN) tablet 4 mg (has no administration in time range)    Or  ondansetron (ZOFRAN) injection 4 mg (has no administration in time range)  enoxaparin (LOVENOX) injection 40 mg (has no administration in time range)  0.9 %  sodium chloride infusion ( Intravenous Not Given 06/27/17 2216)  dextrose 50 % solution 25 mL (has no administration in time range)  0.9 %  sodium chloride infusion ( Intravenous New Bag/Given 06/28/17 0349)  hydrALAZINE (APRESOLINE) injection 10 mg (has no administration in time range)  insulin detemir (LEVEMIR) injection 18 Units (18 Units Subcutaneous Given 06/28/17 0402)  insulin aspart (novoLOG) injection 0-9 Units (has no administration in time range)  sodium chloride 0.9 % bolus 1,000 mL (0 mLs Intravenous Stopped 06/27/17 1910)  sodium chloride 0.9 % bolus 1,000 mL (0 mLs Intravenous Stopped 06/27/17 2050)  LORazepam (ATIVAN) injection 0.25 mg (0.25 mg Intravenous Given 06/27/17 2102)    Mobility walks with device cane

## 2017-06-28 NOTE — ED Notes (Signed)
Bed: UF41 Expected date:  Expected time:  Means of arrival:  Comments: Room 1

## 2017-06-28 NOTE — ED Notes (Signed)
[place] sand witch and diet sprite provided

## 2017-06-28 NOTE — H&P (Signed)
History and Physical    Nanette Wirsing ZOX:[id number] DOB: 05-May-1950 DOA: 06/27/2017  PCP: Aldine Contes, MD  Patient coming from: Home.  Chief Complaint: Feeling dizzy and jerking spells of the arm.  HPI: [name] is a 67 y.o. female with history of diabetes mellitus type 2, hypertension, hypothyroidism, depression and hyperlipidemia presents to the ER because she has been feeling dizzy and not feeling well last 2 to 3 days.  Patient's daughter has terminal cancer and is under hospice.  Patient states she has not been taking her medications well last few days.  Earlier yesterday patient had some jerking spells of the right upper extremity which lasted for around 2 hours.  Did not have any weakness of the extremities or headache visual symptoms.  Then patient went to check on her daughter when patient started feeling dizzy on standing.  This continued and patient decided to come to the ER.  Denies any chest pain or palpitation.  Nausea vomiting abdominal pain or diarrhea.  ED Course: In the ER patient appeared nonfocal.  MRI of brain was negative.  EKG is pending.  Sugars were in the 800s.  With no anion gap.  Patient was started on 3 L of fluids and admitted for hyperosmolar status with also further work-up for the near syncopal and upper extremity jerking spells.  Patient blood pressure also was elevated.  Review of Systems: As per HPI, rest all negative.   Past Medical History:  Diagnosis Date  . Arthritis   . Borderline glaucoma of both eyes   . Depression   . Diabetic polyneuropathy ([place])   . Diverticulosis of colon   . Dry eyes, bilateral   . Feeling of incomplete bladder emptying   . History of breast cancer oncologist-  dr Waymon Budge-- per lov note no recurrence   dx 04/ 1999 --- Stage 3B-- s/p  chemotherapy then right mastectomy then concurrent chemoradiation therapy  . History of urinary retention    01/ 2018  . Hydronephrosis of right kidney   . Hyperlipidemia     . Hypertension   . Hypothyroidism   . Insulin dependent type 2 diabetes mellitus [facility])    endocrinologist-  dr Cruzita Lederer---  last A1c 8.7 on 02-20-2016  . Lymphedema of upper extremity    right  . Macular degeneration, left eye    followed by dr Zigmund Daniel  . Renal insufficiency   . Urgency of urination     Past Surgical History:  Procedure Laterality Date  . CARPAL TUNNEL RELEASE Right 2017   and tendon repair  . CATARACT EXTRACTION W/ INTRAOCULAR LENS  IMPLANT, BILATERAL  2017  . CYSTO/  RIGHT RETROGRADE PYELOGRAM/  UNROOFING RIGHT URETEROCELE  09/18/2000  . CYSTOSCOPY/RETROGRADE/URETEROSCOPY Right 05/09/2016   Procedure: CYSTOSCOPY and right RETROGRADE;  Surgeon: Irine Seal, MD;  Location: [facility];  Service: Urology;  Laterality: Right;  . FINGER SURGERY Left x2 prior to 09-09-2014  . I&D EXTREMITY Left 09/09/2014   Procedure: IRRIGATION AND DEBRIDEMENT LEFT THUMB DISTAL PHALANX;  Surgeon: Daryll Brod, MD;  Location: [place];  Service: Orthopedics;  Laterality: Left;  [name] MASTECTOMY Right 1999   w/  Node dissection's  . PORT-A-CATH REMOVAL  05/01/1999  . TUBAL LIGATION    . VAGINAL HYSTERECTOMY  1970's  . WRIST GANGLION EXCISION Right 2016     reports that she has never smoked. She has never used smokeless tobacco. She reports that she does not drink alcohol or use drugs.  Allergies  Allergen Reactions  . Gabapentin Other (See Comments)    Dizziness from 300 mg, tolerates 100 mg  . Penicillins Rash    Has patient had a PCN reaction causing immediate rash, facial/tongue/throat swelling, SOB or lightheadedness with hypotension: Yes Has patient had a PCN reaction causing severe rash involving mucus membranes or skin necrosis: No Has patient had a PCN reaction that required hospitalization: pt was in hospital at time of reaction Has patient had a PCN reaction occurring within the last 10 years: No If all of the above answers are "NO", then may  proceed with Cephalosporin use.  . Sulfa Antibiotics Rash    Family History  Problem Relation Age of Onset  . Heart disease Father   . Diabetes Father   . Stroke Sister   . Anuerysm Sister 32       brain; maternal half-sister  . Heart disease Brother   . Anuerysm Brother 57       aortic; maternal half-brother  . Breast cancer Daughter 52       negative genetic testing in 2015  . Heart attack Daughter        24-47  . Diabetes Paternal Grandmother   . Stroke Paternal Grandfather   . Anuerysm Brother        NOS type; full brother  . Fibroids Daughter        s/p hysterectomy at 8y  . Brain cancer Maternal Uncle        dx. older than 26; NOS type  . Brain cancer Cousin        maternal 1st cousin; d. early 9s; NOS type  . Deafness Paternal Uncle        prelingual    Prior to Admission medications   Medication Sig Start Date End Date Taking? Authorizing Provider  acetaminophen (MAPAP) 325 MG tablet Take 2 tablets (650 mg total) by mouth every 6 (six) hours as needed for moderate pain (Do not take more than 8 tablets per day). 05/07/17  Yes Narendra, Nischal, MD  ALPHAGAN P 0.1 % SOLN Place 1 drop into both eyes 3 (three) times daily. 06/09/17  Yes [provider]  amLODipine (NORVASC) 10 MG tablet TAKE 1 TABLET(10 MG) BY MOUTH DAILY 02/05/17  Yes Aldine Contes, MD  aspirin EC 81 MG tablet Take 81 mg by mouth at bedtime.   Yes [provider]  calcium citrate-vitamin D (CITRACAL+D) 315-200 MG-UNIT tablet Take 1 tablet by mouth 2 (two) times daily. 06/06/16 06/27/17 Yes Aldine Contes, MD  Cholecalciferol (VITAMIN D3) 2000 units TABS Take 2,000 Units at bedtime by mouth.    Yes [provider]  glucose 4 GM chewable tablet Chew 4 tablets (16 g total) by mouth as needed for low blood sugar. 05/28/12  Yes Paya, Cletus Gash, DO  glucose blood (ONETOUCH VERIO) test strip USE TO CHECK BLOOD SUGAR TWICE DAILY 09/10/16  Yes Aldine Contes, MD  hydrochlorothiazide  (MICROZIDE) 12.5 MG capsule TAKE 1 CAPSULE BY MOUTH EVERY DAY 04/14/17  Yes Aldine Contes, MD  Insulin Pen Needle (BD PEN NEEDLE NANO U/F) 32G X 4 MM MISC USE AS DIRECTED 03/19/17  Yes Aldine Contes, MD  ketorolac (ACULAR) 0.5 % ophthalmic solution Place 1 drop into both eyes 3 (three) times daily. 06/11/17  Yes [provider]  liraglutide (VICTOZA) 18 MG/3ML SOPN ADMINISTER 1.8 MG UNDER THE SKIN DAILY 03/14/17  Yes Philemon Kingdom, MD  metFORMIN (GLUCOPHAGE) 1000 MG tablet TAKE 1 TABLET(1000 MG) BY MOUTH TWICE DAILY WITH A  MEAL Patient taking differently: Take 1,000 mg 2 (two) times daily with a meal by mouth.  09/11/16  Yes Shela Leff, MD  Olopatadine HCl (PATADAY) 0.2 % SOLN Place 1 drop into both eyes daily as needed (dry eyes).    Yes [provider]  [facility] DELICA LANCETS FINE MISC USE TO CHECK BLOOD SUGAR TWICE DAILY 06/02/17  Yes Aldine Contes, MD  Polyethyl Glycol-Propyl Glycol (SYSTANE) 0.4-0.3 % GEL ophthalmic gel Place 1 application daily as needed into both eyes (dry eyes/irritation).    Yes [provider]  repaglinide (PRANDIN) 0.5 MG tablet Take 0.5 mg by mouth daily with supper. 04/12/17  Yes [provider]  venlafaxine XR (EFFEXOR-XR) 150 MG 24 hr capsule TAKE 1 CAPSULE BY MOUTH EVERY DAY WITH BREAKFAST 04/14/17  Yes Aldine Contes, MD  Insulin Detemir (LEVEMIR FLEXTOUCH) 100 UNIT/ML Pen Inject 18 Units into the skin daily before breakfast. Patient not taking: Reported on 06/27/2017 04/29/17   Philemon Kingdom, MD  insulin lispro (HUMALOG KWIKPEN) 100 UNIT/ML KiwkPen Inject 0.05-0.07 mLs (5-7 Units total) into the skin 3 (three) times daily. Patient not taking: Reported on 06/27/2017 04/29/17   Philemon Kingdom, MD  levothyroxine (SYNTHROID, LEVOTHROID) 88 MCG tablet TAKE 1 TABLET BY MOUTH EVERY MORNING BEFORE BREAKFAST Patient not taking: Reported on 06/27/2017 04/14/17   Aldine Contes, MD  pregabalin (LYRICA) 50 MG capsule  Take 1 capsule (50 mg total) by mouth 3 (three) times daily. Patient not taking: Reported on 06/27/2017 04/01/17 04/01/18  Aldine Contes, MD  rosuvastatin (CRESTOR) 20 MG tablet TAKE 1 TABLET(20 MG) BY MOUTH DAILY Patient not taking: Reported on 06/27/2017 04/14/17   Aldine Contes, MD    Physical Exam: Vitals:   06/28/17 0100 06/28/17 0234 06/28/17 0444 06/28/17 0524  BP: (!) 142/83 138/86 130/73 (!) 147/77  Pulse: 80 72 80 79  Resp: 15 16 16 20   Temp:   98 F (36.7 C) 98.1 F (36.7 C)  TempSrc:   Oral Oral  SpO2: 100% 98% 100% 100%  Weight:    56.1 kg (123 lb 10.9 oz)  Height:    5\' 2"  (1.575 m)      Constitutional: Moderately built and nourished. Vitals:   06/28/17 0100 06/28/17 0234 06/28/17 0444 06/28/17 0524  BP: (!) 142/83 138/86 130/73 (!) 147/77  Pulse: 80 72 80 79  Resp: 15 16 16 20   Temp:   98 F (36.7 C) 98.1 F (36.7 C)  TempSrc:   Oral Oral  SpO2: 100% 98% 100% 100%  Weight:    56.1 kg (123 lb 10.9 oz)  Height:    5\' 2"  (1.575 m)   Eyes: Anicteric no pallor. ENMT: No discharge from the ears eyes nose or mouth. Neck: No mass felt.  No neck rigidity. Respiratory: No rhonchi or crepitations. Cardiovascular: S1-S2 heard no murmurs appreciated. Abdomen: Soft nontender bowel sounds present. Musculoskeletal: No edema.  No joint effusion. Skin: No rash.  Skin appears warm. Neurologic: Alert awake oriented to time place and person.  Moves all extremities 5 x 5.  No facial asymmetry tongue is midline. Psychiatric: Appears normal.  Normal affect.   Labs on Admission: I have personally reviewed following labs and imaging studies  CBC: Recent Labs  Lab 06/27/17 1742 06/27/17 2032 06/28/17 0550  WBC 10.2 11.4* 9.4  NEUTROABS 8.4*  --   --   HGB 13.2 14.2 11.9*  HCT 36.5 39.6 33.5*  MCV 86.5 88.6 86.8  PLT 249 249 883   Basic Metabolic Panel: Recent  Labs  Lab 06/27/17 1742 06/27/17 2032 06/28/17 0240 06/28/17 0550  NA 129*  --  140 138  K 4.0  --   3.0* 3.2*  CL 92*  --  107 105  CO2 24  --  26 23  GLUCOSE 890*  --  309* 272*  BUN 22*  --  15 14  CREATININE 1.20* 1.00 0.90 0.87  CALCIUM 9.6  --  9.2 9.0   GFR: Estimated Creatinine Clearance: 50.3 mL/min (by C-G formula based on SCr of 0.87 mg/dL). Liver Function Tests: Recent Labs  Lab 06/27/17 1742  AST 22  ALT 20  ALKPHOS 96  BILITOT 0.6  PROT 7.8  ALBUMIN 4.4   No results for input(s): LIPASE, AMYLASE in the last 168 hours. No results for input(s): AMMONIA in the last 168 hours. Coagulation Profile: No results for input(s): INR, PROTIME in the last 168 hours. Cardiac Enzymes: No results for input(s): CKTOTAL, CKMB, CKMBINDEX, TROPONINI in the last 168 hours. BNP (last 3 results) No results for input(s): PROBNP in the last 8760 hours. HbA1C: Recent Labs    06/28/17 0241  HGBA1C 12.8*   CBG: Recent Labs  Lab 06/27/17 2333 06/27/17 2352 06/28/17 0139 06/28/17 0203 06/28/17 0447  GLUCAP 449* 417* 334* 273* 214*   Lipid Profile: No results for input(s): CHOL, HDL, LDLCALC, TRIG, CHOLHDL, LDLDIRECT in the last 72 hours. Thyroid Function Tests: Recent Labs    06/27/17 2040  TSH 6.763*   Anemia Panel: No results for input(s): VITAMINB12, FOLATE, FERRITIN, TIBC, IRON, RETICCTPCT in the last 72 hours. Urine analysis:    Component Value Date/Time   COLORURINE COLORLESS (A) 06/27/2017 1725   APPEARANCEUR CLEAR 06/27/2017 1725   LABSPEC 1.018 06/27/2017 1725   PHURINE 7.0 06/27/2017 1725   GLUCOSEU >=500 (A) 06/27/2017 1725   HGBUR MODERATE (A) 06/27/2017 1725   BILIRUBINUR NEGATIVE 06/27/2017 1725   BILIRUBINUR Negative 11/14/2016 [place] 06/27/2017 1725   PROTEINUR NEGATIVE 06/27/2017 1725   UROBILINOGEN 0.2 11/14/2016 1026   UROBILINOGEN 0.2 04/01/2014 1110   NITRITE NEGATIVE 06/27/2017 1725   LEUKOCYTESUR TRACE (A) 06/27/2017 1725   Sepsis Labs: @LABRCNTIP (procalcitonin:4,lacticidven:4) )No results found for this or any  previous visit (from the past 240 hour(s)).   Radiological Exams on Admission: Mr Brain Wo Contrast  Result Date: 06/27/2017 CLINICAL DATA:  Initial evaluation for acute altered mental status. EXAM: MRI HEAD WITHOUT CONTRAST TECHNIQUE: Multiplanar, multiecho pulse sequences of the brain and surrounding structures were obtained without intravenous contrast. COMPARISON:  Prior CT from 04/01/2017 as well as previous brain MRI from 10/08/2006. FINDINGS: Brain: Age-appropriate cerebral volume. Minimal T2/FLAIR hyperintensity within the periventricular and deep white matter both cerebral hemispheres, most like related chronic small vessel ischemic change, felt to be within normal limits for age. No evidence for acute infarct. Gray-white matter differentiation maintained. No areas of chronic infarction. No acute or chronic intracranial hemorrhage. No mass lesion, midline shift or mass effect. No hydrocephalus. No extra-axial fluid collection. Major dural sinuses grossly patent. Pituitary gland suprasellar region normal. Midline structures intact and normal. Vascular: Major intracranial vascular flow voids are maintained. Skull and upper cervical spine: Craniocervical junction normal. Upper cervical spine normal. Bone marrow signal intensity within normal limits. Approximate 12 mm lesion at the right frontal scalp, indeterminate, but grossly similar as compared to previous MRI from 2008, likely benign. Sinuses/Orbits: Globes and orbital soft tissues within normal limits. Patient status post lens extraction bilaterally. Paranasal sinuses are clear. No significant mastoid effusion. [place]  ear structures normal. Other: None. IMPRESSION: 1. Normal brain MRI for age. No acute intracranial abnormality identified. 2. Approximate 12 mm right frontal scalp lesion, indeterminate, but grossly similar as compared to previous MRI from 2008, likely benign. Correlation with physical exam recommended. Electronically Signed   By:  Jeannine Boga M.D.   On: 06/27/2017 22:14     Assessment/Plan Principal Problem:   Hyperosmolar non-ketotic state in patient with type 2 diabetes mellitus ([place]) Active Problems:   Hypothyroidism   HTN, goal below 140/90   CKD (chronic kidney disease) stage 3, GFR 30-59 ml/min ([facility])   Acute encephalopathy    1. Hyperosmolar nonketotic state uncontrolled diabetes mellitus type 2 -likely secondary to patient noncompliant with her medications.  Hemoglobin A1c was around 11.8 in November 2018.  Presently it is around 12.8.  Patient has been aggressively started on fluid hydration and on IV insulin infusion.  Once patient's blood sugars are less than 250 we will change to subcutaneous insulin long-acting 1 with sliding scale coverage.  Advised to be compliant with medications. 2. Jerking spells of the right expert extremity -discussed with on-call neurologist Dr. Rory Percy.  Dr. Lorraine Lax reviewed the MRI findings and at this time feels MRI brain was unremarkable and patient symptoms probably related to the severe hyperglycemia and anxiety state.  No further work-up. 3. Hypertension uncontrolled -we will continue amlodipine but hold hydrochlorothiazide due to dehydration.  PRN IV hydralazine for now. 4. Chronic kidney disease stage III -follow metabolic panel. 5. Hypothyroidism on Synthroid. 6. Hyperlipidemia on statins. 7. Near syncope episode -had some dizziness earlier yesterday morning.  Will check orthostatics 2D echo EKG which all are pending.  Patient usually follows up with teaching service internal medicine [facility].   DVT prophylaxis: Lovenox. Code Status: Full code. Family Communication: Discussed with patient. Disposition Plan: Home. Consults called: None. Admission status: Observation.   Rise Patience MD [facility] Pager [phone].  If 7PM-7AM, please contact night-coverage [email or web address] Password [facility]  06/28/2017, 6:54 AM

## 2017-06-28 NOTE — ED Notes (Signed)
cbg 214  3 units given

## 2017-06-28 NOTE — ED Notes (Signed)
06/27/2017

## 2017-06-28 NOTE — Progress Notes (Signed)
Echocardiogram 2D Echocardiogram has been performed.  [name] 06/28/2017, 11:04 AM

## 2017-06-29 DIAGNOSIS — G253 Myoclonus: Secondary | ICD-10-CM

## 2017-06-29 DIAGNOSIS — G9340 Encephalopathy, unspecified: Secondary | ICD-10-CM

## 2017-06-29 DIAGNOSIS — E119 Type 2 diabetes mellitus without complications: Secondary | ICD-10-CM | POA: Diagnosis not present

## 2017-06-29 DIAGNOSIS — N183 Chronic kidney disease, stage 3 (moderate): Secondary | ICD-10-CM

## 2017-06-29 DIAGNOSIS — Z9114 Patient's other noncompliance with medication regimen: Secondary | ICD-10-CM

## 2017-06-29 DIAGNOSIS — F331 Major depressive disorder, recurrent, moderate: Secondary | ICD-10-CM | POA: Diagnosis not present

## 2017-06-29 DIAGNOSIS — R739 Hyperglycemia, unspecified: Secondary | ICD-10-CM | POA: Diagnosis not present

## 2017-06-29 DIAGNOSIS — R42 Dizziness and giddiness: Secondary | ICD-10-CM | POA: Diagnosis not present

## 2017-06-29 DIAGNOSIS — I10 Essential (primary) hypertension: Secondary | ICD-10-CM

## 2017-06-29 DIAGNOSIS — E039 Hypothyroidism, unspecified: Secondary | ICD-10-CM

## 2017-06-29 DIAGNOSIS — Z79899 Other long term (current) drug therapy: Secondary | ICD-10-CM

## 2017-06-29 DIAGNOSIS — E1100 Type 2 diabetes mellitus with hyperosmolarity without nonketotic hyperglycemic-hyperosmolar coma (NKHHC): Secondary | ICD-10-CM | POA: Diagnosis not present

## 2017-06-29 LAB — CBC
HCT: 35.1 % — ABNORMAL LOW (ref 36.0–46.0)
Hemoglobin: 12 g/dL (ref 12.0–15.0)
MCH: 30.8 pg (ref 26.0–34.0)
MCHC: 34.2 g/dL (ref 30.0–36.0)
MCV: 90 fL (ref 78.0–100.0)
Platelets: 232 10*3/uL (ref 150–400)
RBC: 3.9 MIL/uL (ref 3.87–5.11)
RDW: 12.2 % (ref 11.5–15.5)
WBC: 7 10*3/uL (ref 4.0–10.5)

## 2017-06-29 LAB — BASIC METABOLIC PANEL
Anion gap: 8 (ref 5–15)
BUN: 12 mg/dL (ref 6–20)
CO2: 25 mmol/L (ref 22–32)
Calcium: 8.9 mg/dL (ref 8.9–10.3)
Chloride: 106 mmol/L (ref 101–111)
Creatinine, Ser: 0.72 mg/dL (ref 0.44–1.00)
GFR calc Af Amer: >60 mL/min (ref >60)
GFR calc non Af Amer: >60 mL/min (ref >60)
Glucose, Bld: 285 mg/dL — ABNORMAL HIGH (ref 65–99)
Potassium: 4 mmol/L (ref 3.5–5.1)
Sodium: 139 mmol/L (ref 135–145)

## 2017-06-29 LAB — GLUCOSE, CAPILLARY
Glucose-Capillary: 123 mg/dL — ABNORMAL HIGH (ref 65–99)
Glucose-Capillary: 153 mg/dL — ABNORMAL HIGH (ref 65–99)
Glucose-Capillary: 301 mg/dL — ABNORMAL HIGH (ref 65–99)
Glucose-Capillary: 353 mg/dL — ABNORMAL HIGH (ref 65–99)

## 2017-06-29 MED ORDER — DEXTROSE-NACL 5-0.2 % IV SOLN: INTRAVENOUS | Status: DC

## 2017-06-29 MED ORDER — BUPROPION HCL ER (XL) 150 MG PO TB24: 150.0000 mg | ORAL_TABLET | Freq: Every day | ORAL | 0 refills | Status: DC

## 2017-06-29 MED ORDER — VENLAFAXINE HCL ER 150 MG PO CP24
150.0000 mg | ORAL_CAPSULE | Freq: Every day | ORAL | 0 refills | Status: DC
Start: 2017-06-30 — End: 2017-07-11

## 2017-06-29 MED ORDER — TRAZODONE HCL 50 MG PO TABS: 50.0000 mg | ORAL_TABLET | Freq: Every evening | ORAL | Status: DC | PRN

## 2017-06-29 MED ORDER — TRAZODONE HCL 50 MG PO TABS: 50.0000 mg | ORAL_TABLET | Freq: Every evening | ORAL | 0 refills | Status: DC | PRN

## 2017-06-29 MED ORDER — BUPROPION HCL ER (XL) 150 MG PO TB24
150.0000 mg | ORAL_TABLET | Freq: Every day | ORAL | Status: DC
  Administered 2017-06-29: 150 mg via ORAL
  Filled 2017-06-29: qty 1

## 2017-06-29 NOTE — Consult Note (Signed)
[facility] Face-to-Face Psychiatry Consult   Reason for Consult: ''depression'' Referring Physician:  Dr. Nevada Crane Patient Identification: [name] MRN:  [id number] Principal Diagnosis: Major depressive disorder, recurrent episode, moderate ([place]) Diagnosis:   Patient Active Problem List   Diagnosis Date Noted  . Major depressive disorder, recurrent episode, moderate ([place]) [F33.1] 06/29/2017  . Hyperosmolar non-ketotic state in patient with type 2 diabetes mellitus ([place]) [E11.00] 06/27/2017  . Acute encephalopathy [G93.40] 06/27/2017  . Hydronephrosis [N13.30] 04/01/2017  . Sciatica of left side without back pain [M54.32] 01/30/2017  . Neuropathy [G62.9] 12/09/2016  . Hypokalemia [E87.6] 12/09/2016  . Hypomagnesemia [E83.42] 12/09/2016  . Genetic testing [Z13.79] 11/27/2015  . Bilateral hip pain [M25.551, M25.552] 02/24/2015  . Preventative health care [Z00.00] 08/26/2012  . HTN, goal below 140/90 [I10] 08/13/2012  . CKD (chronic kidney disease) stage 3, GFR 30-59 ml/min (HCC) [N18.3] 08/13/2012  . Glaucoma associated with systemic syndromes(365.44) [H40.9] 08/07/2012  . Hypothyroidism [E03.9] 04/16/2006  . Anemia [D64.9] 04/16/2006  . Depression [F32.9] 04/16/2006  . LOW BACK PAIN [M54.5] 04/16/2006  . BREAST CANCER, HX OF [Z85.3] 04/16/2006  . Hyperlipidemia [E78.5] 10/13/2003  . Type 2 diabetes mellitus with diabetic polyneuropathy, with long-term current use of insulin (HCC) [E11.42, Z79.4] 04/16/1991    Total Time spent with patient: 1 hour  Subjective:   [name] is a 67 y.o. female patient admitted with dizziness and jerking spells in her right arm.  HPI:  Patient with history of diabetes mellitus type 2, hypertension, hypothyroidism, Major depressive disorder and hyperlipidemia who was admitted due to dizziness and was found to have elevated blood sugar. Patient reports that she has been feeling increasingly depressed, hopeless, neglecting her hygiene and non-compliant with  her medications in the last few weeks since her daughter was placed in Hospice due to terminal brain Tumor. She reports that the same daughter died yesterday. Today, patient verbalizes ongoing depression, low energy level , lack of motivation, poor appetite but denies psychosis, delusions, suicidal ideation, intent or plan. Patient states that she was diagnosed with Depression in 1998 after she was diagnosed with Breast Cancer. She denies drugs and alcohol abuse.  Past Psychiatric History: MDD  Risk to Self:   Risk to Others:   Prior Inpatient Therapy:   Prior Outpatient Therapy:    Past Medical History:  Past Medical History:  Diagnosis Date  . Arthritis   . Borderline glaucoma of both eyes   . Depression   . Diabetic polyneuropathy ([place])   . Diverticulosis of colon   . Dry eyes, bilateral   . Feeling of incomplete bladder emptying   . History of breast cancer oncologist-  dr Waymon Budge-- per lov note no recurrence   dx 04/ 1999 --- Stage 3B-- s/p  chemotherapy then right mastectomy then concurrent chemoradiation therapy  . History of urinary retention    01/ 2018  . Hydronephrosis of right kidney   . Hyperlipidemia   . Hypertension   . Hypothyroidism   . Insulin dependent type 2 diabetes mellitus [facility])    endocrinologist-  dr Cruzita Lederer---  last A1c 8.7 on 02-20-2016  . Lymphedema of upper extremity    right  . Macular degeneration, left eye    followed by dr Zigmund Daniel  . Renal insufficiency   . Urgency of urination     Past Surgical History:  Procedure Laterality Date  . CARPAL TUNNEL RELEASE Right 2017   and tendon repair  . CATARACT EXTRACTION W/ INTRAOCULAR LENS  IMPLANT, BILATERAL  2017  .  CYSTO/  RIGHT RETROGRADE PYELOGRAM/  UNROOFING RIGHT URETEROCELE  09/18/2000  . CYSTOSCOPY/RETROGRADE/URETEROSCOPY Right 05/09/2016   Procedure: CYSTOSCOPY and right RETROGRADE;  Surgeon: Irine Seal, MD;  Location[facility];  Service: Urology;  Laterality: Right;  .  FINGER SURGERY Left x2 prior to 09-09-2014  . I&D EXTREMITY Left 09/09/2014   Procedure: IRRIGATION AND DEBRIDEMENT LEFT THUMB DISTAL PHALANX;  Surgeon: Daryll Brod, MD;  Location: [place];  Service: Orthopedics;  Laterality: Left;  [name] MASTECTOMY Right 1999   w/  Node dissection's  . PORT-A-CATH REMOVAL  05/01/1999  . TUBAL LIGATION    . VAGINAL HYSTERECTOMY  1970's  . WRIST GANGLION EXCISION Right 2016   Family History:  Family History  Problem Relation Age of Onset  . Heart disease Father   . Diabetes Father   . Stroke Sister   . Anuerysm Sister 21       brain; maternal half-sister  . Heart disease Brother   . Anuerysm Brother 18       aortic; maternal half-brother  . Breast cancer Daughter 22       negative genetic testing in 2015  . Heart attack Daughter        45-47  . Diabetes Paternal Grandmother   . Stroke Paternal Grandfather   . Anuerysm Brother        NOS type; full brother  . Fibroids Daughter        s/p hysterectomy at 44y  . Brain cancer Maternal Uncle        dx. older than 28; NOS type  . Brain cancer Cousin        maternal 1st cousin; d. early 71s; NOS type  . Deafness Paternal Uncle        prelingual   Family Psychiatric  History:  Social History:  Social History   Substance and Sexual Activity  Alcohol Use No  . Alcohol/week: 0.0 oz     Social History   Substance and Sexual Activity  Drug Use No    Social History   Socioeconomic History  . Marital status: Widowed    Spouse name: Not on file  . Number of children: Not on file  . Years of education: Not on file  . Highest education level: Not on file  Occupational History  . Not on file  Social Needs  . Financial resource strain: Not on file  . Food insecurity:    Worry: Not on file    Inability: Not on file  . Transportation needs:    Medical: Not on file    Non-medical: Not on file  Tobacco Use  . Smoking status: Never Smoker  . Smokeless tobacco: Never Used   Substance and Sexual Activity  . Alcohol use: No    Alcohol/week: 0.0 oz  . Drug use: No  . Sexual activity: Not on file  Lifestyle  . Physical activity:    Days per week: Not on file    Minutes per session: Not on file  . Stress: Not on file  Relationships  . Social connections:    Talks on phone: Not on file    Gets together: Not on file    Attends religious service: Not on file    Active member of club or organization: Not on file    Attends meetings of clubs or organizations: Not on file    Relationship status: Not on file  Other Topics Concern  . Not on file  Social History  Narrative  . Not on file   Additional Social History:    Allergies:   Allergies  Allergen Reactions  . Gabapentin Other (See Comments)    Dizziness from 300 mg, tolerates 100 mg  . Penicillins Rash    Has patient had a PCN reaction causing immediate rash, facial/tongue/throat swelling, SOB or lightheadedness with hypotension: Yes Has patient had a PCN reaction causing severe rash involving mucus membranes or skin necrosis: No Has patient had a PCN reaction that required hospitalization: pt was in hospital at time of reaction Has patient had a PCN reaction occurring within the last 10 years: No If all of the above answers are "NO", then may proceed with Cephalosporin use.  . Sulfa Antibiotics Rash    Labs:  Results for orders placed or performed during the hospital encounter of 06/27/17 (from the past 48 hour(s))  CBG monitoring, ED     Status: Abnormal   Collection Time: 06/27/17  5:11 PM  Result Value Ref Range   Glucose-Capillary >600 (HH) 65 - 99 mg/dL  CBG monitoring, ED     Status: Abnormal   Collection Time: 06/27/17  5:13 PM  Result Value Ref Range   Glucose-Capillary >600 (HH) 65 - 99 mg/dL  Urinalysis, Routine w reflex microscopic     Status: Abnormal   Collection Time: 06/27/17  5:25 PM  Result Value Ref Range   Color, Urine COLORLESS (A) YELLOW   APPearance CLEAR CLEAR    Specific Gravity, Urine 1.018 1.005 - 1.030   pH 7.0 5.0 - 8.0   Glucose, UA >=500 (A) NEGATIVE mg/dL   Hgb urine dipstick MODERATE (A) NEGATIVE   Bilirubin Urine NEGATIVE NEGATIVE   Ketones, ur NEGATIVE NEGATIVE mg/dL   Protein, ur NEGATIVE NEGATIVE mg/dL   Nitrite NEGATIVE NEGATIVE   Leukocytes, UA TRACE (A) NEGATIVE   WBC, UA 0-5 0 - 5 WBC/hpf   Bacteria, UA NONE SEEN NONE SEEN    Comment: Performed at [facility], [place] [address], [place], [place] [address]  CBC with Differential     Status: Abnormal   Collection Time: 06/27/17  5:42 PM  Result Value Ref Range   WBC 10.2 4.0 - 10.5 K/uL   RBC 4.22 3.87 - 5.11 MIL/uL   Hemoglobin 13.2 12.0 - 15.0 g/dL   HCT 36.5 36.0 - 46.0 %   MCV 86.5 78.0 - 100.0 fL   MCH 31.3 26.0 - 34.0 pg   MCHC 36.2 (H) 30.0 - 36.0 g/dL   RDW 12.2 11.5 - 15.5 %   Platelets 249 150 - 400 K/uL   Neutrophils Relative % 83 %   Neutro Abs 8.4 (H) 1.7 - 7.7 K/uL   Lymphocytes Relative 14 %   Lymphs Abs 1.4 0.7 - 4.0 K/uL   Monocytes Relative 3 %   Monocytes Absolute 0.4 0.1 - 1.0 K/uL   Eosinophils Relative 0 %   Eosinophils Absolute 0.0 0.0 - 0.7 K/uL   Basophils Relative 0 %   Basophils Absolute 0.0 0.0 - 0.1 K/uL    Comment: Performed at [facility], [place] [address], [place], [place] [address]  Comprehensive metabolic panel     Status: Abnormal   Collection Time: 06/27/17  5:42 PM  Result Value Ref Range   Sodium 129 (L) 135 - 145 mmol/L   Potassium 4.0 3.5 - 5.1 mmol/L   Chloride 92 (L) 101 - 111 mmol/L   CO2 24 22 - 32 mmol/L   Glucose, Bld 890 (  HH) 65 - 99 mg/dL    Comment: CRITICAL RESULT CALLED TO, READ BACK BY AND VERIFIED WITH: R BARHAM,RN [id number] @ 1838 BY J SCOTTON    BUN 22 (H) 6 - 20 mg/dL   Creatinine, Ser 1.20 (H) 0.44 - 1.00 mg/dL   Calcium 9.6 8.9 - 10.3 mg/dL   Total Protein 7.8 6.5 - 8.1 g/dL   Albumin 4.4 3.5 - 5.0 g/dL   AST 22 15 - 41 U/L   ALT 20 14 - 54 U/L   Alkaline Phosphatase 96  38 - 126 U/L   Total Bilirubin 0.6 0.3 - 1.2 mg/dL   GFR calc non Af Amer 46 (L) >60 mL/min   GFR calc Af Amer 53 (L) >60 mL/min    Comment: (NOTE) The eGFR has been calculated using the CKD EPI equation. This calculation has not been validated in all clinical situations. eGFR's persistently <60 mL/min signify possible Chronic Kidney Disease.    Anion gap 13 5 - 15    Comment: Performed at [facility], [place] [address] [place], [place] [address]  CBG monitoring, ED     Status: Abnormal   Collection Time: 06/27/17  6:46 PM  Result Value Ref Range   Glucose-Capillary >600 (HH) 65 - 99 mg/dL  CBG monitoring, ED     Status: Abnormal   Collection Time: 06/27/17  6:48 PM  Result Value Ref Range   Glucose-Capillary >600 (HH) 65 - 99 mg/dL  Blood gas, venous     Status: Abnormal   Collection Time: 06/27/17  7:15 PM  Result Value Ref Range   FIO2 21.00    Delivery systems ROOM AIR    pH, Ven 7.421 7.250 - 7.430   pCO2, Ven 36.3 (L) 44.0 - 60.0 mmHg   pO2, Ven BELOW REPORTABLE RANGE 32.0 - 45.0 mmHg    Comment: CRITICAL RESULT CALLED TO, READ BACK BY AND VERIFIED WITH: Bluford Main, RN AT 1928 BY JESSICA NEUGENT,RRT,RCP ON 06/27/17    Bicarbonate 23.2 20.0 - 28.0 mmol/L   Acid-base deficit 0.4 0.0 - 2.0 mmol/L   O2 Saturation 46.1 %   Patient temperature 98.6    Collection site VEIN    Drawn by COLLECTED BY NURSE    Sample type VENOUS     Comment: Performed at [facility], [place] [address], [place], [place] [address]  HIV antibody (Routine Testing)     Status: None   Collection Time: 06/27/17  8:32 PM  Result Value Ref Range   HIV Screen 4th Generation wRfx Non Reactive Non Reactive    Comment: (NOTE) Performed At: [facility] [place], [place] [id number] Rush Farmer MD TD:[id number] Performed at [facility], [place] [address] [place], [place] [address]   CBC     Status: Abnormal   Collection  Time: 06/27/17  8:32 PM  Result Value Ref Range   WBC 11.4 (H) 4.0 - 10.5 K/uL   RBC 4.47 3.87 - 5.11 MIL/uL   Hemoglobin 14.2 12.0 - 15.0 g/dL   HCT 39.6 36.0 - 46.0 %   MCV 88.6 78.0 - 100.0 fL   MCH 31.8 26.0 - 34.0 pg   MCHC 35.9 30.0 - 36.0 g/dL   RDW 12.1 11.5 - 15.5 %   Platelets 249 150 - 400 K/uL    Comment: Performed at [facility], [place] [address] [place],  [address]  Creatinine, serum     Status: Abnormal   Collection  Time: 06/27/17  8:32 PM  Result Value Ref Range   Creatinine, Ser 1.00 0.44 - 1.00 mg/dL   GFR calc non Af Amer 57 (L) >60 mL/min   GFR calc Af Amer >60 >60 mL/min    Comment: (NOTE) The eGFR has been calculated using the CKD EPI equation. This calculation has not been validated in all clinical situations. eGFR's persistently <60 mL/min signify possible Chronic Kidney Disease. Performed at [facility], [place] [address], [place], [place] [address]   CBG monitoring, ED     Status: Abnormal   Collection Time: 06/27/17  8:38 PM  Result Value Ref Range   Glucose-Capillary 566 (HH) 65 - 99 mg/dL   Comment 1 Repeat Test   TSH     Status: Abnormal   Collection Time: 06/27/17  8:40 PM  Result Value Ref Range   TSH 6.763 (H) 0.350 - 4.500 uIU/mL    Comment: Performed by a 3rd Generation assay with a functional sensitivity of <=0.01 uIU/mL. Performed at [facility], [place] [address] [place], [place] [address]   CBG monitoring, ED     Status: Abnormal   Collection Time: 06/27/17  8:40 PM  Result Value Ref Range   Glucose-Capillary 505 (HH) 65 - 99 mg/dL  CBG monitoring, ED     Status: Abnormal   Collection Time: 06/27/17 10:12 PM  Result Value Ref Range   Glucose-Capillary 525 (HH) 65 - 99 mg/dL  CBG monitoring, ED     Status: Abnormal   Collection Time: 06/27/17 11:33 PM  Result Value Ref Range   Glucose-Capillary 449 (H) 65 - 99 mg/dL  CBG monitoring, ED     Status: Abnormal   Collection  Time: 06/27/17 11:52 PM  Result Value Ref Range   Glucose-Capillary 417 (H) 65 - 99 mg/dL  CBG monitoring, ED     Status: Abnormal   Collection Time: 06/28/17  1:39 AM  Result Value Ref Range   Glucose-Capillary 334 (H) 65 - 99 mg/dL  CBG monitoring, ED     Status: Abnormal   Collection Time: 06/28/17  2:03 AM  Result Value Ref Range   Glucose-Capillary 273 (H) 65 - 99 mg/dL  Basic metabolic panel     Status: Abnormal   Collection Time: 06/28/17  2:40 AM  Result Value Ref Range   Sodium 140 135 - 145 mmol/L    Comment: DELTA CHECK NOTED   Potassium 3.0 (L) 3.5 - 5.1 mmol/L    Comment: DELTA CHECK NOTED   Chloride 107 101 - 111 mmol/L   CO2 26 22 - 32 mmol/L   Glucose, Bld 309 (H) 65 - 99 mg/dL   BUN 15 6 - 20 mg/dL   Creatinine, Ser 0.90 0.44 - 1.00 mg/dL   Calcium 9.2 8.9 - 10.3 mg/dL   GFR calc non Af Amer >60 >60 mL/min   GFR calc Af Amer >60 >60 mL/min    Comment: (NOTE) The eGFR has been calculated using the CKD EPI equation. This calculation has not been validated in all clinical situations. eGFR's persistently <60 mL/min signify possible Chronic Kidney Disease.    Anion gap 7 5 - 15    Comment: Performed at [facility], [place] [address] [place],  [address]  Hemoglobin A1c     Status: Abnormal   Collection Time: 06/28/17  2:41 AM  Result Value Ref Range   Hgb A1c MFr Bld 12.8 (H) 4.8 - 5.6 %    Comment: (NOTE)  Pre diabetes:          5.7%-6.4% Diabetes:              >6.4% Glycemic control for   <7.0% adults with diabetes    Mean Plasma Glucose 320.66 mg/dL    Comment: Performed at [place] [address], [place], [place] [address]  Glucose, capillary     Status: Abnormal   Collection Time: 06/28/17  3:10 AM  Result Value Ref Range   Glucose-Capillary 207 (H) 65 - 99 mg/dL  CBG monitoring, ED     Status: Abnormal   Collection Time: 06/28/17  4:47 AM  Result Value Ref Range   Glucose-Capillary 214 (H) 65 - 99 mg/dL  Basic  metabolic panel     Status: Abnormal   Collection Time: 06/28/17  5:50 AM  Result Value Ref Range   Sodium 138 135 - 145 mmol/L   Potassium 3.2 (L) 3.5 - 5.1 mmol/L   Chloride 105 101 - 111 mmol/L   CO2 23 22 - 32 mmol/L   Glucose, Bld 272 (H) 65 - 99 mg/dL   BUN 14 6 - 20 mg/dL   Creatinine, Ser 0.87 0.44 - 1.00 mg/dL   Calcium 9.0 8.9 - 10.3 mg/dL   GFR calc non Af Amer >60 >60 mL/min   GFR calc Af Amer >60 >60 mL/min    Comment: (NOTE) The eGFR has been calculated using the CKD EPI equation. This calculation has not been validated in all clinical situations. eGFR's persistently <60 mL/min signify possible Chronic Kidney Disease.    Anion gap 10 5 - 15    Comment: Performed at [facility], [place] [address], [place], [place] [address]  CBC     Status: Abnormal   Collection Time: 06/28/17  5:50 AM  Result Value Ref Range   WBC 9.4 4.0 - 10.5 K/uL   RBC 3.86 (L) 3.87 - 5.11 MIL/uL   Hemoglobin 11.9 (L) 12.0 - 15.0 g/dL   HCT 33.5 (L) 36.0 - 46.0 %   MCV 86.8 78.0 - 100.0 fL   MCH 30.8 26.0 - 34.0 pg   MCHC 35.5 30.0 - 36.0 g/dL   RDW 12.2 11.5 - 15.5 %   Platelets 256 150 - 400 K/uL    Comment: Performed at [facility], [place] [address] [place], [place] [address]  Troponin I     Status: Abnormal   Collection Time: 06/28/17  7:01 AM  Result Value Ref Range   Troponin I 0.04 (HH) <0.03 ng/mL    Comment: CRITICAL RESULT CALLED TO, READ BACK BY AND VERIFIED WITH: M.FADIGA RN 06/28/2017 [id number] JR Performed at [facility], [place] [address] [place], [place] [address]   Glucose, capillary     Status: Abnormal   Collection Time: 06/28/17  7:38 AM  Result Value Ref Range   Glucose-Capillary 222 (H) 65 - 99 mg/dL  Basic metabolic panel     Status: Abnormal   Collection Time: 06/28/17  9:37 AM  Result Value Ref Range   Sodium 140 135 - 145 mmol/L   Potassium 3.4 (L) 3.5 - 5.1 mmol/L   Chloride 107 101 - 111 mmol/L   CO2 24  22 - 32 mmol/L   Glucose, Bld 211 (H) 65 - 99 mg/dL   BUN 14 6 - 20 mg/dL   Creatinine, Ser 0.83 0.44 - 1.00 mg/dL   Calcium 9.2 8.9 - 10.3 mg/dL   GFR calc non Af  Amer >60 >60 mL/min   GFR calc Af Amer >60 >60 mL/min    Comment: (NOTE) The eGFR has been calculated using the CKD EPI equation. This calculation has not been validated in all clinical situations. eGFR's persistently <60 mL/min signify possible Chronic Kidney Disease.    Anion gap 9 5 - 15    Comment: Performed at [facility], [place] [address], [place], [place] [address]  Glucose, capillary     Status: Abnormal   Collection Time: 06/28/17 11:41 AM  Result Value Ref Range   Glucose-Capillary 202 (H) 65 - 99 mg/dL  Glucose, capillary     Status: Abnormal   Collection Time: 06/28/17  8:28 PM  Result Value Ref Range   Glucose-Capillary 226 (H) 65 - 99 mg/dL   Comment 1 Notify RN   Glucose, capillary     Status: Abnormal   Collection Time: 06/29/17 12:24 AM  Result Value Ref Range   Glucose-Capillary 301 (H) 65 - 99 mg/dL   Comment 1 Notify RN   Glucose, capillary     Status: Abnormal   Collection Time: 06/29/17  5:20 AM  Result Value Ref Range   Glucose-Capillary 123 (H) 65 - 99 mg/dL   Comment 1 Notify RN   Glucose, capillary     Status: Abnormal   Collection Time: 06/29/17  7:27 AM  Result Value Ref Range   Glucose-Capillary 153 (H) 65 - 99 mg/dL  CBC     Status: Abnormal   Collection Time: 06/29/17  9:12 AM  Result Value Ref Range   WBC 7.0 4.0 - 10.5 K/uL   RBC 3.90 3.87 - 5.11 MIL/uL   Hemoglobin 12.0 12.0 - 15.0 g/dL   HCT 35.1 (L) 36.0 - 46.0 %   MCV 90.0 78.0 - 100.0 fL   MCH 30.8 26.0 - 34.0 pg   MCHC 34.2 30.0 - 36.0 g/dL   RDW 12.2 11.5 - 15.5 %   Platelets 232 150 - 400 K/uL    Comment: Performed at [facility], [place] [address], [place], [place] [address]  Basic metabolic panel     Status: Abnormal   Collection Time: 06/29/17  9:12 AM  Result Value Ref Range    Sodium 139 135 - 145 mmol/L   Potassium 4.0 3.5 - 5.1 mmol/L   Chloride 106 101 - 111 mmol/L   CO2 25 22 - 32 mmol/L   Glucose, Bld 285 (H) 65 - 99 mg/dL   BUN 12 6 - 20 mg/dL   Creatinine, Ser 0.72 0.44 - 1.00 mg/dL   Calcium 8.9 8.9 - 10.3 mg/dL   GFR calc non Af Amer >60 >60 mL/min   GFR calc Af Amer >60 >60 mL/min    Comment: (NOTE) The eGFR has been calculated using the CKD EPI equation. This calculation has not been validated in all clinical situations. eGFR's persistently <60 mL/min signify possible Chronic Kidney Disease.    Anion gap 8 5 - 15    Comment: Performed at [facility], [place] [address], [place], [place] [address]    Current Facility-Administered Medications  Medication Dose Route Frequency Provider Last Rate Last Dose  . 0.9 %  sodium chloride infusion   Intravenous Continuous Rise Patience, MD 125 mL/hr at 06/29/17 0009 125 mL/hr at 06/29/17 0009  . acetaminophen (TYLENOL) tablet 650 mg  650 mg Oral Q6H PRN Rise Patience, MD       Or  . acetaminophen (TYLENOL) suppository 650  mg  650 mg Rectal Q6H PRN Rise Patience, MD      . amLODipine (NORVASC) tablet 10 mg  10 mg Oral Daily Rise Patience, MD   10 mg at 06/29/17 1030  . aspirin EC tablet 81 mg  81 mg Oral QHS Rise Patience, MD   81 mg at 06/28/17 2205  . brimonidine (ALPHAGAN) 0.15 % ophthalmic solution 1 drop  1 drop Both Eyes TID Rise Patience, MD   1 drop at 06/29/17 1031  . calcium-vitamin D (OSCAL WITH D) 500-200 MG-UNIT per tablet 1 tablet  1 tablet Oral BID Rise Patience, MD   1 tablet at 06/29/17 1031  . dextrose 5 % and 0.2 % NaCl infusion   Intravenous Continuous Hall, Carole N, DO      . dextrose 50 % solution 25 mL  25 mL Intravenous PRN Rise Patience, MD      . enoxaparin (LOVENOX) injection 40 mg  40 mg Subcutaneous Daily Rise Patience, MD   40 mg at 06/29/17 1031  . hydrALAZINE (APRESOLINE) injection 10 mg  10 mg  Intravenous Q4H PRN Rise Patience, MD      . insulin aspart (novoLOG) injection 0-9 Units  0-9 Units Subcutaneous Q4H Rise Patience, MD   2 Units at 06/29/17 0831  . insulin detemir (LEVEMIR) injection 18 Units  18 Units Subcutaneous Daily Rise Patience, MD   18 Units at 06/29/17 1043  . ketorolac (ACULAR) 0.5 % ophthalmic solution 1 drop  1 drop Both Eyes TID Rise Patience, MD   1 drop at 06/29/17 1031  . ondansetron (ZOFRAN) tablet 4 mg  4 mg Oral Q6H PRN Rise Patience, MD       Or  . ondansetron [facility]) injection 4 mg  4 mg Intravenous Q6H PRN Rise Patience, MD      . venlafaxine XR (EFFEXOR-XR) 24 hr capsule 150 mg  150 mg Oral Q breakfast Rise Patience, MD   150 mg at 06/29/17 0830    Musculoskeletal: Strength & Muscle Tone: within normal limits Gait & Station: lying in bed Patient leans: N/A  Psychiatric Specialty Exam: Physical Exam  Psychiatric: Judgment and thought content normal. Her affect is blunt. Her speech is delayed. She is slowed and withdrawn. Cognition and memory are normal. She exhibits a depressed mood.    Review of Systems  Constitutional: Positive for malaise/fatigue and weight loss.  HENT: Negative.   Eyes: Negative.   Respiratory: Negative.   Cardiovascular: Negative.   Gastrointestinal: Negative.   Skin: Negative.   Neurological: Negative.   Endo/Heme/Allergies: Negative.   Psychiatric/Behavioral: Positive for depression. The patient has insomnia.     Blood pressure (!) 150/91, pulse 99, temperature 98.1 F (36.7 C), temperature source Oral, resp. rate 18, height 5' 2"  (1.575 m), weight 56.7 kg (125 lb), SpO2 100 %.Body mass index is 22.86 kg/m.  General Appearance: Casual  Eye Contact:  Good  Speech:  Clear and Coherent and Slow  Volume:  Decreased  Mood:  Depressed  Affect:  Constricted  Thought Process:  Coherent and Linear  Orientation:  Full (Time, Place, and Person)  Thought Content:  Logical   Suicidal Thoughts:  No  Homicidal Thoughts:  No  Memory:  Immediate;   Good Recent;   Good Remote;   Good  Judgement:  Fair  Insight:  Fair  Psychomotor Activity:  Psychomotor Retardation  Concentration:  Concentration: Fair and Attention Span: Fair  Recall:  Good  Fund of Knowledge:  Good  Language:  Good  Akathisia:  No  Handed:  Right  AIMS (if indicated):     Assets:  Communication Skills Desire for Improvement  ADL's:  Intact  Cognition:  WNL  Sleep:   poor     Treatment Plan Summary: Medication management /Recommendations: -Continue Effexor ER 150 mg daily for depression -Add Wellbutrin XL 150 mg daily for depression. -Add Trazodone 50 mg qhs as needed for sleep -Refer to therapist/psychiatrist  upon discharge -Consider social worker consult  -Psychiatric services signing off. Re-consult if needed in future.  Disposition: No evidence of imminent risk to self or others at present.   Patient does not meet criteria for psychiatric inpatient admission. Supportive therapy provided about ongoing stressors. Unit social worker to assist with outpatient referral to psychiatrist and therapist  Corena Pilgrim, MD 06/29/2017 10:53 AM

## 2017-06-29 NOTE — Progress Notes (Signed)
Nutrition Brief Note  Patient identified on the Malnutrition Screening Tool (MST) Report  Patient's weight has remained stable. Pt currently grieving the loss of her daughter yesterday. Pt is followed by RD in outpatient setting for her diabetes. Do not feel nutrition interventions are appropriate at this time.  Wt Readings from Last 15 Encounters:  06/29/17 125 lb (56.7 kg)  04/29/17 127 lb 6.4 oz (57.8 kg)  04/01/17 127 lb 12.8 oz (58 kg)  03/14/17 130 lb (59 kg)  01/30/17 128 lb (58.1 kg)  01/23/17 126 lb 6.4 oz (57.3 kg)  12/27/16 129 lb 1.6 oz (58.6 kg)  12/09/16 133 lb 12.8 oz (60.7 kg)  11/21/16 133 lb (60.3 kg)  11/14/16 132 lb 12.8 oz (60.2 kg)  11/07/16 129 lb 14.4 oz (58.9 kg)  09/11/16 134 lb 4.8 oz (60.9 kg)  09/03/16 132 lb 4.8 oz (60 kg)  08/15/16 131 lb 9.6 oz (59.7 kg)  05/28/16 143 lb 6.4 oz (65 kg)    Body mass index is 22.86 kg/m. Patient meets criteria for normal based on current BMI.   Current diet order is Heart Healthy/CHO modified, patient is consuming approximately 75% of meals at this time. Labs and medications reviewed.   No nutrition interventions warranted at this time. If nutrition issues arise, please consult RD.   Clayton Bibles, MS, RD, [place] [facility] Pager: [phone] After Hours Pager: [phone]

## 2017-06-29 NOTE — Discharge Summary (Signed)
Discharge Summary  [name] DJT:[id number] DOB: [birth year]  PCP: Aldine Contes, MD  Admit date: 06/27/2017 Discharge date: 06/29/2017  Time spent: 25 minutes  Recommendations for Outpatient Follow-up:  1. Follow-up with PCP 2. Follow-up with psychiatry 3. Take medications as prescribed  Discharge Diagnoses:  Active Hospital Problems   Diagnosis Date Noted  . Major depressive disorder, recurrent episode, moderate ([facility]) 06/29/2017  . Hyperosmolar non-ketotic state in patient with type 2 diabetes mellitus ([facility]) 06/27/2017  . Acute encephalopathy 06/27/2017  . HTN, goal below 140/90 08/13/2012  . CKD (chronic kidney disease) stage 3, GFR 30-59 ml/min ([facility]) 08/13/2012  . Hypothyroidism 04/16/2006    Resolved Hospital Problems  No resolved problems to display.    Discharge Condition: Stable  Diet recommendation: Resume previous diet  Vitals:   06/28/17 2026 06/29/17 0432  BP: 137/81 (!) 150/91  Pulse: 80 99  Resp: 14 18  Temp: 98.4 F (36.9 C) 98.1 F (36.7 C)  SpO2: 99% 100%    History of present illness:   [name] is a 67 y.o. female with history of diabetes mellitus type 2, hypertension, hypothyroidism, depression and hyperlipidemia presents to the ER because she has been feeling dizzy and not feeling well last 2 to 3 days.  Patient's daughter has terminal cancer and is under hospice.  Patient states she has not been taking her medications well last few days.  Earlier yesterday patient had some jerking spells of the right upper extremity which lasted for around 2 hours.  Did not have any weakness of the extremities or headache visual symptoms.  Then patient went to check on her daughter when patient started feeling dizzy on standing.  This continued and patient decided to come to the ER.  Denies any chest pain or palpitation.  Nausea vomiting abdominal pain or diarrhea.  ED Course: In the ER patient appeared nonfocal.  MRI of brain was negative.  EKG is  pending.  Sugars were in the 800s.  With no anion gap.  Patient was started on 3 L of fluids and admitted for hyperosmolar status with also further work-up for the near syncopal and upper extremity jerking spells.  Patient blood pressure also was elevated.  06/29/2017: Patient seen and examined at bedside.  No acute events overnight.  No new complaints.  Denies dizziness, headache, chest pain, dyspnea, or palpitation.  Unfortunately she lost her daughter  last night to brain cancer.  Admits to depression.  Has no suicidal ideation no homicidal ideation.  Psychiatry consulted and started on new medications.  Social worker consulted to arrange for outpatient psych follow-up.  On the day of discharge patient was hemodynamically stable.  She will need to follow-up with her primary care provider, psychiatry post hospitalization.  Hospital Course:  Principal Problem:   Major depressive disorder, recurrent episode, moderate (HCC) Active Problems:   Hypothyroidism   HTN, goal below 140/90   CKD (chronic kidney disease) stage 3, GFR 30-59 ml/min ([facility]CC)   Hyperosmolar non-ketotic state in patient with type 2 diabetes mellitus ([place])   Acute encephalopathy  1. Hyperosmolar nonketotic state uncontrolled diabetes mellitus type 2 -likely secondary to patient noncompliant with her medications.  Hemoglobin A1c was around 11.8 in November 2018.  Presently it is around 12.8.  Patient has been aggressively started on fluid hydration and on IV insulin infusion.  Once patient's blood sugars are less than 250 we will change to subcutaneous insulin long-acting 1 with sliding scale coverage.  Advised to be compliant with medications. 2.  Jerking spells of the right expert extremity -discussed with on-call neurologist Dr. Rory Percy.  Dr. Lorraine Lax reviewed the MRI findings and at this time feels MRI brain was unremarkable and patient symptoms probably related to the severe hyperglycemia and anxiety state.  No further  work-up. 3. Hypertension uncontrolled -we will continue amlodipine but hold hydrochlorothiazide due to dehydration.  PRN IV hydralazine for now. 4. Chronic kidney disease stage III -follow metabolic panel. 5. Hypothyroidism on Synthroid. 6. Hyperlipidemia on statins. 7. Near syncope episode -had some dizziness earlier yesterday morning.  Will check orthostatics 2D echo EKG which all are pending. 8. Newly diagnosed chronic diastolic CHF with preserved EF 55 to 60%-2D echo done on 06/28/2017.  Talked to cardiologist on call, Dr. Aundra Dubin, who recommended controlling hypertension and stated no need for cardiology consult or follow-up at this point.  Patient will follow-up with her PCP post hospitalization. 9. Chronic depression.  Lost her daughter from brain cancer last night.  Denies suicidal or homicidal ideation.  Psychiatry consulted and recommended adding Wellbutrin XL 150 mg daily for depression, adding trazodone 50 mg nightly as needed for sleep.  To continue Effexor ER 150 mg daily for depression.     Procedures:  None  Consultations:  Psychiatry  Discharge Exam: BP (!) 150/91 (BP Location: Left Arm)   Pulse 99   Temp 98.1 F (36.7 C) (Oral)   Resp 18   Ht 5\' 2"  (1.575 m)   Wt 56.7 kg (125 lb)   SpO2 100%   BMI 22.86 kg/m  . General: 67 y.o. year-old female well developed well nourished in no acute distress.  Alert and oriented x3. . Cardiovascular: Regular rate and rhythm with no rubs or gallops.  No thyromegaly or JVD noted.   [name] Respiratory: Clear to auscultation with no wheezes or rales. Good inspiratory effort. . Abdomen: Soft nontender nondistended with normal bowel sounds x4 quadrants. . Musculoskeletal: No lower extremity edema. 2/4 pulses in all 4 extremities. . Skin: No ulcerative lesions noted or rashes, . Psychiatry: Mood is appropriate for condition and setting  Discharge Instructions You were cared for by a hospitalist during your hospital stay. If you have any  questions about your discharge medications or the care you received while you were in the hospital after you are discharged, you can call the unit and asked to speak with the hospitalist on call if the hospitalist that took care of you is not available. Once you are discharged, your primary care physician will handle any further medical issues. Please note that NO REFILLS for any discharge medications will be authorized once you are discharged, as it is imperative that you return to your primary care physician (or establish a relationship with a primary care physician if you do not have one) for your aftercare needs so that they can reassess your need for medications and monitor your lab values.   Allergies as of 06/29/2017      Reactions   Gabapentin Other (See Comments)   Dizziness from 300 mg, tolerates 100 mg   Penicillins Rash   Has patient had a PCN reaction causing immediate rash, facial/tongue/throat swelling, SOB or lightheadedness with hypotension: Yes Has patient had a PCN reaction causing severe rash involving mucus membranes or skin necrosis: No Has patient had a PCN reaction that required hospitalization: pt was in hospital at time of reaction Has patient had a PCN reaction occurring within the last 10 years: No If all of the above answers are "NO", then may proceed  with Cephalosporin use.   Sulfa Antibiotics Rash      Medication List    STOP taking these medications   acetaminophen 325 MG tablet Commonly known as:  MAPAP     TAKE these medications   ALPHAGAN P 0.1 % Soln Generic drug:  brimonidine Place 1 drop into both eyes 3 (three) times daily.   amLODipine 10 MG tablet Commonly known as:  NORVASC TAKE 1 TABLET(10 MG) BY MOUTH DAILY   aspirin EC 81 MG tablet Take 81 mg by mouth at bedtime.   buPROPion 150 MG 24 hr tablet Commonly known as:  WELLBUTRIN XL Take 1 tablet (150 mg total) by mouth daily.   calcium citrate-vitamin D 315-200 MG-UNIT tablet Commonly  known as:  CITRACAL+D Take 1 tablet by mouth 2 (two) times daily.   glucose 4 GM chewable tablet Chew 4 tablets (16 g total) by mouth as needed for low blood sugar.   glucose blood test strip Commonly known as:  ONETOUCH VERIO USE TO CHECK BLOOD SUGAR TWICE DAILY   hydrochlorothiazide 12.5 MG capsule Commonly known as:  MICROZIDE TAKE 1 CAPSULE BY MOUTH EVERY DAY   Insulin Detemir 100 UNIT/ML Pen Commonly known as:  LEVEMIR FLEXTOUCH Inject 18 Units into the skin daily before breakfast.   insulin lispro 100 UNIT/ML KiwkPen Commonly known as:  HUMALOG KWIKPEN Inject 0.05-0.07 mLs (5-7 Units total) into the skin 3 (three) times daily.   Insulin Pen Needle 32G X 4 MM Misc Commonly known as:  BD PEN NEEDLE NANO U/F USE AS DIRECTED   ketorolac 0.5 % ophthalmic solution Commonly known as:  ACULAR Place 1 drop into both eyes 3 (three) times daily.   levothyroxine 88 MCG tablet Commonly known as:  SYNTHROID, LEVOTHROID TAKE 1 TABLET BY MOUTH EVERY MORNING BEFORE BREAKFAST   liraglutide 18 MG/3ML Sopn Commonly known as:  VICTOZA ADMINISTER 1.8 MG UNDER THE SKIN DAILY   metFORMIN 1000 MG tablet Commonly known as:  GLUCOPHAGE TAKE 1 TABLET(1000 MG) BY MOUTH TWICE DAILY WITH A MEAL What changed:    how much to take  how to take this  when to take this  additional instructions   ONETOUCH DELICA LANCETS FINE Misc USE TO CHECK BLOOD SUGAR TWICE DAILY   PATADAY 0.2 % Soln Generic drug:  Olopatadine HCl Place 1 drop into both eyes daily as needed (dry eyes).   pregabalin 50 MG capsule Commonly known as:  LYRICA Take 1 capsule (50 mg total) by mouth 3 (three) times daily.   repaglinide 0.5 MG tablet Commonly known as:  PRANDIN Take 0.5 mg by mouth daily with supper.   rosuvastatin 20 MG tablet Commonly known as:  CRESTOR TAKE 1 TABLET(20 MG) BY MOUTH DAILY   SYSTANE 0.4-0.3 % Gel ophthalmic gel Generic drug:  Polyethyl Glycol-Propyl Glycol Place 1 application  daily as needed into both eyes (dry eyes/irritation).   traZODone 50 MG tablet Commonly known as:  DESYREL Take 1 tablet (50 mg total) by mouth at bedtime as needed for sleep.   venlafaxine XR 150 MG 24 hr capsule Commonly known as:  EFFEXOR-XR Take 1 capsule (150 mg total) by mouth daily with breakfast. Start taking on:  06/30/2017 What changed:  See the new instructions.   Vitamin D3 2000 units Tabs Take 2,000 Units at bedtime by mouth.      Allergies  Allergen Reactions  . Gabapentin Other (See Comments)    Dizziness from 300 mg, tolerates 100 mg  . Penicillins Rash  Has patient had a PCN reaction causing immediate rash, facial/tongue/throat swelling, SOB or lightheadedness with hypotension: Yes Has patient had a PCN reaction causing severe rash involving mucus membranes or skin necrosis: No Has patient had a PCN reaction that required hospitalization: pt was in hospital at time of reaction Has patient had a PCN reaction occurring within the last 10 years: No If all of the above answers are "NO", then may proceed with Cephalosporin use.  Ignacia Bayley Antibiotics Rash   Follow-up Information    Aldine Contes, MD. Call in 1 day(s).   Specialty:  Internal Medicine Why:  Please call for an appointment. Contact information: [place], [place] [address] [phone]        [facility] Follow up.   Specialty:  Catering manager information: [employer] [place] [address] [phone]        [facility]. Call in 1 day(s).   Specialty:  Behavioral Health Why:  please call for an appointment. Contact information: [place] [address] [phone]            The results of significant diagnostics from this hospitalization (including imaging, microbiology, ancillary and laboratory) are listed below for reference.    Significant Diagnostic  Studies: Mr Brain Wo Contrast  Result Date: 06/27/2017 CLINICAL DATA:  Initial evaluation for acute altered mental status. EXAM: MRI HEAD WITHOUT CONTRAST TECHNIQUE: Multiplanar, multiecho pulse sequences of the brain and surrounding structures were obtained without intravenous contrast. COMPARISON:  Prior CT from 04/01/2017 as well as previous brain MRI from 10/08/2006. FINDINGS: Brain: Age-appropriate cerebral volume. Minimal T2/FLAIR hyperintensity within the periventricular and deep white matter both cerebral hemispheres, most like related chronic small vessel ischemic change, felt to be within normal limits for age. No evidence for acute infarct. Gray-white matter differentiation maintained. No areas of chronic infarction. No acute or chronic intracranial hemorrhage. No mass lesion, midline shift or mass effect. No hydrocephalus. No extra-axial fluid collection. Major dural sinuses grossly patent. Pituitary gland suprasellar region normal. Midline structures intact and normal. Vascular: Major intracranial vascular flow voids are maintained. Skull and upper cervical spine: Craniocervical junction normal. Upper cervical spine normal. Bone marrow signal intensity within normal limits. Approximate 12 mm lesion at the right frontal scalp, indeterminate, but grossly similar as compared to previous MRI from 2008, likely benign. Sinuses/Orbits: Globes and orbital soft tissues within normal limits. Patient status post lens extraction bilaterally. Paranasal sinuses are clear. No significant mastoid effusion. Inner ear structures normal. Other: None. IMPRESSION: 1. Normal brain MRI for age. No acute intracranial abnormality identified. 2. Approximate 12 mm right frontal scalp lesion, indeterminate, but grossly similar as compared to previous MRI from 2008, likely benign. Correlation with physical exam recommended. Electronically Signed   By: Jeannine Boga M.D.   On: 06/27/2017 22:14    Microbiology: No  results found for this or any previous visit (from the past 240 hour(s)).   Labs: Basic Metabolic Panel: Recent Labs  Lab 06/27/17 1742 06/27/17 2032 06/28/17 0240 06/28/17 0550 06/28/17 0937 06/29/17 0912  NA 129*  --  140 138 140 139  K 4.0  --  3.0* 3.2* 3.4* 4.0  CL 92*  --  107 105 107 106  CO2 24  --  26 23 24 25   GLUCOSE 890*  --  309* 272* 211* 285*  BUN 22*  --  15 14 14 12   CREATININE 1.20* 1.00  0.90 0.87 0.83 0.72  CALCIUM 9.6  --  9.2 9.0 9.2 8.9   Liver Function Tests: Recent Labs  Lab 06/27/17 1742  AST 22  ALT 20  ALKPHOS 96  BILITOT 0.6  PROT 7.8  ALBUMIN 4.4   No results for input(s): LIPASE, AMYLASE in the last 168 hours. No results for input(s): AMMONIA in the last 168 hours. CBC: Recent Labs  Lab 06/27/17 1742 06/27/17 2032 06/28/17 0550 06/29/17 0912  WBC 10.2 11.4* 9.4 7.0  NEUTROABS 8.4*  --   --   --   HGB 13.2 14.2 11.9* 12.0  HCT 36.5 39.6 33.5* 35.1*  MCV 86.5 88.6 86.8 90.0  PLT 249 249 256 232   Cardiac Enzymes: Recent Labs  Lab 06/28/17 0701  TROPONINI 0.04*   BNP: BNP (last 3 results) No results for input(s): BNP in the last 8760 hours.  ProBNP (last 3 results) No results for input(s): PROBNP in the last 8760 hours.  CBG: Recent Labs  Lab 06/28/17 2028 06/29/17 0024 06/29/17 0520 06/29/17 0727 06/29/17 1125  GLUCAP 226* 301* 123* 153* 353*       Signed:  Kayleen Memos, MD [facility] 06/29/2017, 11:48 AM

## 2017-06-29 NOTE — Discharge Instructions (Signed)
Hyperglycemia Hyperglycemia is when the sugar (glucose) level in your blood is too high. It may not cause symptoms. If you do have symptoms, they may include warning signs, such as:  Feeling more thirsty than normal.  Hunger.  Feeling tired.  Needing to pee (urinate) more than normal.  Blurry eyesight (vision). You may get other symptoms as it gets worse, such as:  Dry mouth.  Not being hungry (loss of appetite).  Fruity-smelling breath.  Weakness.  Weight gain or loss that is not planned. Weight loss may be fast.  A tingling or numb feeling in your hands or feet.  Headache.  Skin that does not bounce back quickly when it is lightly pinched and released (poor skin turgor).  Pain in your belly (abdomen).  Cuts or bruises that heal slowly. High blood sugar can happen to people who do or do not have diabetes. High blood sugar can happen slowly or quickly, and it can be an emergency. Follow these instructions at home: General instructions  Take over-the-counter and prescription medicines only as told by your doctor.  Do not use products that contain nicotine or tobacco, such as cigarettes and e-cigarettes. If you need help quitting, ask your doctor.  Limit alcohol intake to no more than 1 drink per day for nonpregnant women and 2 drinks per day for men. One drink equals 12 oz of beer, 5 oz of wine, or 1 oz of hard liquor.  Manage stress. If you need help with this, ask your doctor.  Keep all follow-up visits as told by your doctor. This is important. Eating and drinking  Stay at a healthy weight.  Exercise regularly, as told by your doctor.  Drink enough fluid, especially when you:  Exercise.  Get sick.  Are in hot temperatures.  Eat healthy foods, such as:  Low-fat (lean) proteins.  Complex carbs (complex carbohydrates), such as whole wheat bread or brown rice.  Fresh fruits and vegetables.  Low-fat dairy products.  Healthy fats.  Drink enough  fluid to keep your pee (urine) clear or pale yellow. If you have diabetes:  Make sure you know the symptoms of hyperglycemia.  Follow your diabetes management plan, as told by your doctor. Make sure you:  Take insulin and medicines as told.  Follow your exercise plan.  Follow your meal plan. Eat on time. Do not skip meals.  Check your blood sugar as often as told. Make sure to check before and after exercise. If you exercise longer or in a different way than you normally do, check your blood sugar more often.  Follow your sick day plan whenever you cannot eat or drink normally. Make this plan ahead of time with your doctor.  Share your diabetes management plan with people in your workplace, school, and household.  Check your urine for ketones when you are ill and as told by your doctor.  Carry a card or wear jewelry that says that you have diabetes. Contact a doctor if:  Your blood sugar level is higher than 240 mg/dL (13.3 mmol/L) for 2 days in a row.  You have problems keeping your blood sugar in your target range.  High blood sugar happens often for you. Get help right away if:  You have trouble breathing.  You have a change in how you think, feel, or act (mental status).  You feel sick to your stomach (nauseous), and that feeling does not go away.  You cannot stop throwing up (vomiting). These symptoms may be an   emergency. Do not wait to see if the symptoms will go away. Get medical help right away. Call your local emergency services (911 in the [place] Do not drive yourself to the hospital.  Summary  Hyperglycemia is when the sugar (glucose) level in your blood is too high.  High blood sugar can happen to people who do or do not have diabetes.  Make sure you drink enough fluids, eat healthy foods, and exercise regularly.  Contact your doctor if you have problems keeping your blood sugar in your target range. This information is not intended to replace advice given  to you by your health care provider. Make sure you discuss any questions you have with your health care provider. Document Released: 11/11/2008 Document Revised: 10/02/2015 Document Reviewed: 10/02/2015 Elsevier Interactive Patient Education  2017 [employer]vier Inc.  

## 2017-06-30 LAB — GLUCOSE, CAPILLARY
Glucose-Capillary: 170 mg/dL — ABNORMAL HIGH (ref 65–99)
Glucose-Capillary: 566 mg/dL (ref 65–99)
Glucose-Capillary: >600 mg/dL (ref 65–99)
Glucose-Capillary: >600 mg/dL (ref 65–99)
Glucose-Capillary: 264 mg/dL — ABNORMAL HIGH (ref 65–99)
Glucose-Capillary: 381 mg/dL — ABNORMAL HIGH (ref 65–99)
Glucose-Capillary: 468 mg/dL — ABNORMAL HIGH (ref 65–99)

## 2017-07-08 ENCOUNTER — Encounter (INDEPENDENT_AMBULATORY_CARE_PROVIDER_SITE_OTHER): Payer: Self-pay

## 2017-07-08 ENCOUNTER — Encounter: Payer: Self-pay | Admitting: Internal Medicine

## 2017-07-08 ENCOUNTER — Other Ambulatory Visit: Payer: Self-pay

## 2017-07-08 ENCOUNTER — Ambulatory Visit (INDEPENDENT_AMBULATORY_CARE_PROVIDER_SITE_OTHER): Payer: Medicare Other | Admitting: Internal Medicine

## 2017-07-08 VITALS — BP 126/62 | HR 92 | Temp 98.6°F | Ht 62.5 in | Wt 125.0 lb

## 2017-07-08 DIAGNOSIS — E1122 Type 2 diabetes mellitus with diabetic chronic kidney disease: Secondary | ICD-10-CM

## 2017-07-08 DIAGNOSIS — Z79899 Other long term (current) drug therapy: Secondary | ICD-10-CM | POA: Diagnosis not present

## 2017-07-08 DIAGNOSIS — N183 Chronic kidney disease, stage 3 unspecified: Secondary | ICD-10-CM

## 2017-07-08 DIAGNOSIS — I129 Hypertensive chronic kidney disease with stage 1 through stage 4 chronic kidney disease, or unspecified chronic kidney disease: Secondary | ICD-10-CM

## 2017-07-08 DIAGNOSIS — E1142 Type 2 diabetes mellitus with diabetic polyneuropathy: Secondary | ICD-10-CM

## 2017-07-08 DIAGNOSIS — E039 Hypothyroidism, unspecified: Secondary | ICD-10-CM | POA: Diagnosis not present

## 2017-07-08 DIAGNOSIS — N1330 Unspecified hydronephrosis: Secondary | ICD-10-CM

## 2017-07-08 DIAGNOSIS — F329 Major depressive disorder, single episode, unspecified: Secondary | ICD-10-CM | POA: Diagnosis not present

## 2017-07-08 DIAGNOSIS — Z794 Long term (current) use of insulin: Secondary | ICD-10-CM

## 2017-07-08 DIAGNOSIS — F32A Depression, unspecified: Secondary | ICD-10-CM

## 2017-07-08 DIAGNOSIS — Z634 Disappearance and death of family member: Secondary | ICD-10-CM

## 2017-07-08 DIAGNOSIS — I10 Essential (primary) hypertension: Secondary | ICD-10-CM

## 2017-07-08 DIAGNOSIS — Z9114 Patient's other noncompliance with medication regimen: Secondary | ICD-10-CM | POA: Diagnosis not present

## 2017-07-08 LAB — POCT GLYCOSYLATED HEMOGLOBIN (HGB A1C): Hemoglobin A1C: 14 % — AB (ref 4.0–5.6)

## 2017-07-08 LAB — GLUCOSE, CAPILLARY: Glucose-Capillary: 387 mg/dL — ABNORMAL HIGH (ref 65–99)

## 2017-07-08 MED ORDER — LEVOTHYROXINE SODIUM 88 MCG PO TABS: 88.0000 ug | ORAL_TABLET | Freq: Every day | ORAL | 0 refills | Status: DC

## 2017-07-08 NOTE — Progress Notes (Signed)
   Subjective:    Patient ID: [name], female    DOB: 01-05-1951, 67 y.o.   MRN: [id number]  HPI  I have seen and examined this patient.  Patient is here for follow-up of her diabetes and hypertension.  Patient states that she has not been regularly taking her medications since her daughter had been sick and recently passed away at the beginning of this month.  [name] was recently admitted to the hospital at the end of May for HHS in the setting of being noncompliant with her medications.  Patient states that she is taking metformin now but has not been taking her Victoza or her insulin.  Patient also states that she restarted her Synthroid and has been taking her Effexor.  Patient has also been prescribed Wellbutrin and trazodone by psychiatry in addition to her Effexor for worsening depression in the setting of her daughter passing away.  She has not picked up these medications yet.  Review of Systems  Constitutional: Positive for appetite change. Negative for fatigue and fever.       Patient with poor appetite per her granddaughter as well as by patient's own admission  HENT: Negative.   Respiratory: Negative.   Cardiovascular: Negative.   Gastrointestinal: Negative.   Musculoskeletal: Negative.   Neurological: Negative.   Psychiatric/Behavioral: Negative for agitation and behavioral problems.       Objective:   Physical Exam  Constitutional: She is oriented to person, place, and time. She appears well-developed and well-nourished.  HENT:  Head: Normocephalic and atraumatic.  Mouth/Throat: No oropharyngeal exudate.  Neck: Neck supple.  Cardiovascular: Normal rate and regular rhythm.  Pulmonary/Chest: Effort normal and breath sounds normal. She has no wheezes. She has no rales.  Abdominal: Soft. Bowel sounds are normal. She exhibits no distension. There is no tenderness.  Musculoskeletal: She exhibits no edema.  Lymphadenopathy:    She has no cervical adenopathy.    Neurological: She is alert and oriented to person, place, and time.  Psychiatric:  Patient appears to have a flat affect today          Assessment & Plan:  Please see problem based charting for assessment and plan:

## 2017-07-08 NOTE — Assessment & Plan Note (Signed)
-  This problem is chronic and stable -Patient follows up with urology -Dr. Jeffie Pollock  -She had a Lasix renogram done which showed likely low-grade partial right sided obstruction possibly at the level of the distal ureter -RN Regino Schultze called the urology office and they state that they we will follow-up with her in a year (April 2020)

## 2017-07-08 NOTE — Assessment & Plan Note (Signed)
-  This problem is chronic and stable -She had blood work done in the hospital recently which showed her creatinine to be normal -We will continue to monitor closely and recheck her BMP on her follow-up visit

## 2017-07-08 NOTE — Assessment & Plan Note (Signed)
BP Readings from Last 3 Encounters:  07/08/17 126/62  06/29/17 (!) 143/82  04/29/17 108/72    Lab Results  Component Value Date   NA 139 06/29/2017   K 4.0 06/29/2017   CREATININE 0.72 06/29/2017    Assessment: Blood pressure control:  Well-controlled Progress toward BP goal:   At goal Comments: Patient states that she is compliant with her amlodipine 10 mg as well as HCTZ 12.5 mg  Plan: Medications:  continue current medications Educational resources provided:   Self management tools provided:   Other plans:

## 2017-07-08 NOTE — Assessment & Plan Note (Signed)
-  Patient's depression has acutely worsened in the setting of the loss of her daughter -Patient was seen by psychiatry as an inpatient who advised her to start Wellbutrin in addition to her Effexor for her depression as well as trazodone for sleep as needed -Patient is also advised to go to grief counseling but states that she is not ready for this yet -Patient does have good support system at home and her granddaughter is here for her appointment -We will continue to monitor her closely

## 2017-07-08 NOTE — Patient Instructions (Signed)
-  It was a pleasure seeing you today -Please restart your Victoza and Levemir as well as your thyroid medication -Please check your blood sugars for the next 2 weeks and follow-up in the clinic to make sure that your blood sugars are better controlled -We will call Dr. Ralene Muskrat office and make sure he got the results of the scan be ordered -Please follow-up with grief counseling once you feel your up to this -The psychiatrist at the hospital prescribed Wellbutrin for your depression as well as trazodone to help you sleep as needed.  Please pick up these medications when possible -Please call me if you need anything

## 2017-07-08 NOTE — Assessment & Plan Note (Signed)
Lab Results  Component Value Date   HGBA1C 14.0 (A) 07/08/2017   HGBA1C 12.8 (H) 06/28/2017   HGBA1C 13.0 03/14/2017     Assessment: Diabetes control:  Uncontrolled Progress toward A1C goal:   Deteriorated Comments: Patient states that she is taking only metformin 1000 milligrams twice daily.  She has not been taking her Humalog or her Levemir or Victoza  Plan: Medications:  Patient was instructed to continue her metformin as well as resume Victoza 1.8 mg and Levemir 18 units daily.  We asked her to hold off on her Humalog for now given her inconsistent appetite Home glucose monitoring: Frequency:   Timing:   Instruction/counseling given: reminded to bring blood glucose meter & log to each visit and reminded to bring medications to each visit Educational resources provided:   Self management tools provided:   Other plans: Patient will start checking her blood sugars again and follow-up in [facility] in 2 weeks

## 2017-07-11 ENCOUNTER — Other Ambulatory Visit: Payer: Self-pay | Admitting: Internal Medicine

## 2017-07-11 DIAGNOSIS — E1142 Type 2 diabetes mellitus with diabetic polyneuropathy: Secondary | ICD-10-CM

## 2017-07-11 DIAGNOSIS — Z794 Long term (current) use of insulin: Principal | ICD-10-CM

## 2017-07-11 DIAGNOSIS — I10 Essential (primary) hypertension: Secondary | ICD-10-CM

## 2017-07-17 ENCOUNTER — Encounter (INDEPENDENT_AMBULATORY_CARE_PROVIDER_SITE_OTHER): Payer: Medicare Other | Admitting: Ophthalmology

## 2017-07-22 ENCOUNTER — Other Ambulatory Visit: Payer: Self-pay

## 2017-07-22 ENCOUNTER — Telehealth: Payer: Self-pay | Admitting: Dietician

## 2017-07-22 ENCOUNTER — Encounter: Payer: Self-pay | Admitting: Internal Medicine

## 2017-07-22 ENCOUNTER — Ambulatory Visit (INDEPENDENT_AMBULATORY_CARE_PROVIDER_SITE_OTHER): Payer: Medicare Other | Admitting: Internal Medicine

## 2017-07-22 ENCOUNTER — Ambulatory Visit: Payer: Medicare Other | Admitting: Pharmacist

## 2017-07-22 VITALS — BP 155/69 | HR 98 | Temp 98.2°F | Ht 62.0 in | Wt 122.2 lb

## 2017-07-22 DIAGNOSIS — N183 Chronic kidney disease, stage 3 (moderate): Secondary | ICD-10-CM | POA: Diagnosis not present

## 2017-07-22 DIAGNOSIS — Z90710 Acquired absence of both cervix and uterus: Secondary | ICD-10-CM

## 2017-07-22 DIAGNOSIS — Z0000 Encounter for general adult medical examination without abnormal findings: Secondary | ICD-10-CM

## 2017-07-22 DIAGNOSIS — F329 Major depressive disorder, single episode, unspecified: Secondary | ICD-10-CM

## 2017-07-22 DIAGNOSIS — Z853 Personal history of malignant neoplasm of breast: Secondary | ICD-10-CM | POA: Diagnosis not present

## 2017-07-22 DIAGNOSIS — R634 Abnormal weight loss: Secondary | ICD-10-CM | POA: Diagnosis not present

## 2017-07-22 DIAGNOSIS — R350 Frequency of micturition: Secondary | ICD-10-CM

## 2017-07-22 DIAGNOSIS — Z6822 Body mass index (BMI) 22.0-22.9, adult: Secondary | ICD-10-CM

## 2017-07-22 DIAGNOSIS — E1122 Type 2 diabetes mellitus with diabetic chronic kidney disease: Secondary | ICD-10-CM | POA: Diagnosis not present

## 2017-07-22 DIAGNOSIS — E039 Hypothyroidism, unspecified: Secondary | ICD-10-CM | POA: Diagnosis not present

## 2017-07-22 DIAGNOSIS — I129 Hypertensive chronic kidney disease with stage 1 through stage 4 chronic kidney disease, or unspecified chronic kidney disease: Secondary | ICD-10-CM | POA: Diagnosis not present

## 2017-07-22 DIAGNOSIS — Z794 Long term (current) use of insulin: Secondary | ICD-10-CM

## 2017-07-22 DIAGNOSIS — F339 Major depressive disorder, recurrent, unspecified: Secondary | ICD-10-CM | POA: Diagnosis not present

## 2017-07-22 DIAGNOSIS — E1142 Type 2 diabetes mellitus with diabetic polyneuropathy: Secondary | ICD-10-CM

## 2017-07-22 DIAGNOSIS — Z79890 Hormone replacement therapy (postmenopausal): Secondary | ICD-10-CM

## 2017-07-22 DIAGNOSIS — F32A Depression, unspecified: Secondary | ICD-10-CM

## 2017-07-22 DIAGNOSIS — Z79899 Other long term (current) drug therapy: Secondary | ICD-10-CM

## 2017-07-22 DIAGNOSIS — Z86010 Personal history of colonic polyps: Secondary | ICD-10-CM

## 2017-07-22 DIAGNOSIS — Z9011 Acquired absence of right breast and nipple: Secondary | ICD-10-CM

## 2017-07-22 LAB — GLUCOSE, CAPILLARY: Glucose-Capillary: 430 mg/dL — ABNORMAL HIGH (ref 70–99)

## 2017-07-22 NOTE — Progress Notes (Signed)
Discussed diabetes regimen adherence and placed CGM today. Patient describes sleeping until 10 am - 1 pm on most days and has trouble sleeping at night. As a result, patient often misses morning medication doses. Sister-in-law assisted with verification of medication adherence. Describes missing levemir, victoza, and metformin doses x2-3 weekly. Does not take humalog when she skips meals, which is often.  Relayed the following education about CGM to the patient:  Please record the time, amount and what food drinks and activities you have while wearing the continuous glucose monitor(CGM) in the folder provided.  Bring the folder with you to follow up appointments  Do not have a CT or an MRI while wearing the CGM.   Please make an appointment for 1 week with me and a doctor for the first of two CGM downloads..   You will also return in 2 weeks to have your second download and the CGM removed.   LOT: [id number] SN: [id number] EXP: 12/27/2017  Follow up in 1 week for results review with patient.  Brett Fairy, PharmD Candidate

## 2017-07-22 NOTE — Patient Instructions (Addendum)
It was a pleasure to see you [name].  I am referring you for a repeat colonoscopy and to psychiatry. You will be called for appointments.  I am checking your thyroid level today.  Please take your medications as prescribing, especially your diabetes medications.  Please follow up with Dr. Cruzita Lederer as scheduled. Follow up with Dr. Dareen Piano in 4 weeks or sooner if needed.

## 2017-07-22 NOTE — Telephone Encounter (Signed)
[name] says she needs appointments for next week for her CGM follow up. Will ask front office to give her a call.

## 2017-07-22 NOTE — Assessment & Plan Note (Addendum)
Patient is noted to have significant weight loss over the last 2 years of nearly 30 pounds.  She has a history of breast cancer status post right-sided mastectomy with node dissection in 1999. She had screening unilateral left mammogram August 2018 which was negative.  Her last colonoscopy was in January 2014 with Dr. Amedeo Plenty of the [facility]. Which was notable for a 15 mm polyp in the distal ascending colon and diverticulosis.  Pathology of the polyp showed a tubulovillous adenoma and was negative for high-grade dysplasia.  She was recommended for repeat colonoscopy in 3 years.  She is status post hysterectomy.  Patient also has significant depression and reports poor appetite, only eating about 1 meal per day. A/P: Patient with significant weight loss over the last 2 years likely largely contributed from her depression however should also consider underlying malignancy given her history of breast cancer.  Repeat left-sided mammogram last year was reassuring.  I have referred her to psychiatry for further management of her depression.  Her sister-in-law is wondering if nutritional supplement shakes would be useful which I have encouraged her to try.

## 2017-07-22 NOTE — Assessment & Plan Note (Signed)
Patient has long-standing depression which has been worsened with the recent loss of her daughter.  She is accompanied by her sister-in-law who reports patient does appear depressed spends most of her time in her home.  Patient reports low motivation to get out of the house or be active.  She also reports inconsistent use of her medications particularly her diabetes meds and admits to missing more so than not.  She was seen by psychiatry while admitted at [facility] for [facility].  She was started on Wellbutrin XL 150 mg daily, trazodone 50 mg nightly as needed, continued on her Effexor ER 150 mg daily.  She says she is taking these medications.  She was recommended to follow-up with psychiatry outpatient as well as therapy.  She denies any suicidal or homicidal ideation. A/P: Patient with major depression which is significantly impacting her health and quality of life.  It appears that her uncontrolled diabetes is a direct consequence of her depression.  I have advised her that she continue her current medications and I will send a referral to psychiatry.  I have also recommended she obtain therapy/counseling as well.

## 2017-07-22 NOTE — Assessment & Plan Note (Addendum)
Patient had a slightly elevated TSH of 6.763 on 06/27/2017 when admitted for [facility].  She has been taking levothyroxine 88 mcg dose adjustment for 2 years now. A/P: Slightly elevated TSH in the setting of acute illness.  We will recheck her TSH today to assess for need to adjust her levothyroxine dosing.  ADDENDUM: TSH is low at 0.335. Will decrease Levothyroxine dose from 88 mcg to 75 mcg. Discussed results and plan with patient by phone. She is understanding. Will need repeat TSH in about 6 weeks.

## 2017-07-22 NOTE — Progress Notes (Addendum)
Patient was seen in clinic with Brett Fairy and Madlyn Frankel, PharmD candidates. I agree with the assessment and plan of care documented.  Of note, [facility] fill history shows insulin detemir last filled in November 2018; insulin lispro no history of filling; rosuvastatin January 2019, and repaglinide March 2019  Patient was seen today in a co-visit between the physician and pharmacist.  See documentation under Dr. Serita Grit visit for details.

## 2017-07-22 NOTE — Progress Notes (Addendum)
CC: Diabetes  HPI:  [name] is a 67 y.o. female with PMH as listed below including type 2 diabetes, hypertension, hypothyroidism, CKD 3, and depression who presents for follow-up management of her diabetes.  Please see problem based charting for the status of patient's chronic medical issues.  Depression Patient has long-standing depression which has been worsened with the recent loss of her daughter.  She is accompanied by her sister-in-law who reports patient does appear depressed spends most of her time in her home.  Patient reports low motivation to get out of the house or be active.  She also reports inconsistent use of her medications particularly her diabetes meds and admits to missing more so than not.  She was seen by psychiatry while admitted at [facility] for [facility].  She was started on Wellbutrin XL 150 mg daily, trazodone 50 mg nightly as needed, continued on her Effexor ER 150 mg daily.  She says she is taking these medications.  She was recommended to follow-up with psychiatry outpatient as well as therapy.  She denies any suicidal or homicidal ideation. A/P: Patient with major depression which is significantly impacting her health and quality of life.  It appears that her uncontrolled diabetes is a direct consequence of her depression.  I have advised her that she continue her current medications and I will send a referral to psychiatry.  I have also recommended she obtain therapy/counseling as well.  Type 2 diabetes mellitus with diabetic polyneuropathy, with long-term current use of insulin [facility]) Patient seen for follow-up of her diabetes.  Her last A1c was 14 on 07/08/2017.  She reported only taking her metformin at that time and not her Victoza, Levemir, or Humalog.  She did not bring her meter today.  Her CBG today is 430.  She does report polyuria and polydipsia.  She admits that she is more often than not missing her medications.  She is depressed and has low  motivation to take her medications or participate in social activities. A/P: Patient with uncontrolled diabetes worsened recently likely secondary to her depression.  We are working on psychiatry referral and recommendations for therapy/counseling in addition to her pharmacotherapy for her depression as I feel that we will not make much progress in controlling her diabetes without first addressing her depression.  I have advised her to continue her current medications as prescribed.  She will follow-up with her endocrinologist 07/30/2017 and with [place] again in about 4 weeks.  Hypothyroidism Patient had a slightly elevated TSH of 6.763 on 06/27/2017 when admitted for [facility].  She has been taking levothyroxine 88 mcg dose adjustment for 2 years now. A/P: Slightly elevated TSH in the setting of acute illness.  We will recheck her TSH today to assess for need to adjust her levothyroxine dosing.  ADDENDUM: TSH is low at 0.335. Will decrease Levothyroxine dose from 88 mcg to 75 mcg. Discussed results and plan with patient by phone. She is understanding. Will need repeat TSH in about 6 weeks.  Preventative health care Patient is noted to have significant weight loss over the last 2 years of nearly 30 pounds.  She has a history of breast cancer status post right-sided mastectomy with node dissection in 1999. She had screening unilateral left mammogram August 2018 which was negative.  Her last colonoscopy was in January 2014 with Dr. Amedeo Plenty of the [facility]. Which was notable for a 15 mm polyp in the distal ascending colon and diverticulosis.  Pathology of the  polyp showed a tubulovillous adenoma and was negative for high-grade dysplasia.  She was recommended for repeat colonoscopy in 3 years.  She is status post hysterectomy.  Patient also has significant depression and reports poor appetite, only eating about 1 meal per day. A/P: Patient with significant weight loss over the last 2 years likely largely contributed  from her depression however should also consider underlying malignancy given her history of breast cancer.  Repeat left-sided mammogram last year was reassuring.  I have referred her to psychiatry for further management of her depression.  Her sister-in-law is wondering if nutritional supplement shakes would be useful which I have encouraged her to try.    Past Medical History:  Diagnosis Date  . Arthritis   . Borderline glaucoma of both eyes   . Depression   . Diabetic polyneuropathy ([place])   . Diverticulosis of colon   . Dry eyes, bilateral   . Feeling of incomplete bladder emptying   . History of breast cancer oncologist-  dr Waymon Budge-- per lov note no recurrence   dx 04/ 1999 --- Stage 3B-- s/p  chemotherapy then right mastectomy then concurrent chemoradiation therapy  . History of urinary retention    01/ 2018  . Hydronephrosis of right kidney   . Hyperlipidemia   . Hypertension   . Hypothyroidism   . Insulin dependent type 2 diabetes mellitus [facility])    endocrinologist-  dr Cruzita Lederer---  last A1c 8.7 on 02-20-2016  . Lymphedema of upper extremity    right  . Macular degeneration, left eye    followed by dr Zigmund Daniel  . Renal insufficiency   . Urgency of urination    Review of Systems:   Review of Systems  Constitutional: Positive for weight loss.  Respiratory: Negative for shortness of breath.   Cardiovascular: Negative for chest pain and palpitations.  Gastrointestinal:       Low appetite  Genitourinary: Positive for frequency.  Endo/Heme/Allergies: Positive for polydipsia.  Psychiatric/Behavioral: Positive for depression. Negative for suicidal ideas.     Physical Exam:  Vitals:   07/22/17 1058  BP: (!) 155/69  Pulse: 98  Temp: 98.2 F (36.8 C)  TempSrc: Oral  SpO2: 100%  Weight: 122 lb 3.2 oz (55.4 kg)  Height: 5\' 2"  (1.575 m)   Physical Exam  Constitutional: She is oriented to person, place, and time.  HENT:  Head: Normocephalic and atraumatic.    Cardiovascular: Normal rate and regular rhythm.  No murmur heard. Pulmonary/Chest: Effort normal. No respiratory distress. She has no wheezes. She has no rales.  Musculoskeletal:  Nonpitting edema both feet  Neurological: She is alert and oriented to person, place, and time.  Skin: Skin is warm. She is not diaphoretic.  Psychiatric: She exhibits a depressed mood.     Assessment & Plan:   See Encounters Tab for problem based charting.  Patient discussed with Dr. Lynnae January

## 2017-07-22 NOTE — Telephone Encounter (Signed)
Calling patient per her request.

## 2017-07-22 NOTE — Assessment & Plan Note (Signed)
Patient seen for follow-up of her diabetes.  Her last A1c was 14 on 07/08/2017.  She reported only taking her metformin at that time and not her Victoza, Levemir, or Humalog.  She did not bring her meter today.  Her CBG today is 430.  She does report polyuria and polydipsia.  She admits that she is more often than not missing her medications.  She is depressed and has low motivation to take her medications or participate in social activities. A/P: Patient with uncontrolled diabetes worsened recently likely secondary to her depression.  We are working on psychiatry referral and recommendations for therapy/counseling in addition to her pharmacotherapy for her depression as I feel that we will not make much progress in controlling her diabetes without first addressing her depression.  I have advised her to continue her current medications as prescribed.  She will follow-up with her endocrinologist 07/30/2017 and with [place] again in about 4 weeks.

## 2017-07-23 DIAGNOSIS — H401131 Primary open-angle glaucoma, bilateral, mild stage: Secondary | ICD-10-CM | POA: Diagnosis not present

## 2017-07-23 DIAGNOSIS — E103512 Type 1 diabetes mellitus with proliferative diabetic retinopathy with macular edema, left eye: Secondary | ICD-10-CM | POA: Diagnosis not present

## 2017-07-23 DIAGNOSIS — H59032 Cystoid macular edema following cataract surgery, left eye: Secondary | ICD-10-CM | POA: Diagnosis not present

## 2017-07-23 DIAGNOSIS — E10311 Type 1 diabetes mellitus with unspecified diabetic retinopathy with macular edema: Secondary | ICD-10-CM | POA: Diagnosis not present

## 2017-07-23 DIAGNOSIS — E10319 Type 1 diabetes mellitus with unspecified diabetic retinopathy without macular edema: Secondary | ICD-10-CM | POA: Diagnosis not present

## 2017-07-23 LAB — TSH: TSH: 0.355 u[IU]/mL — ABNORMAL LOW (ref 0.450–4.500)

## 2017-07-23 MED ORDER — LEVOTHYROXINE SODIUM 75 MCG PO TABS: 75.0000 ug | ORAL_TABLET | Freq: Every day | ORAL | 0 refills | Status: DC

## 2017-07-23 NOTE — Addendum Note (Signed)
Addended by: Lenore Cordia on: 07/23/2017 01:19 PM   Modules accepted: Orders

## 2017-07-23 NOTE — Progress Notes (Signed)
Internal Medicine Clinic Attending  Case discussed with Dr. Patel at the time of the visit.  We reviewed the resident's history and exam and pertinent patient test results.  I agree with the assessment, diagnosis, and plan of care documented in the resident's note.  

## 2017-07-28 NOTE — Telephone Encounter (Signed)
Unable to leave message to inform patient of appointment tomorrow.  Voicemail is full.

## 2017-07-29 ENCOUNTER — Ambulatory Visit (INDEPENDENT_AMBULATORY_CARE_PROVIDER_SITE_OTHER): Payer: Medicare Other | Admitting: Pharmacist

## 2017-07-29 ENCOUNTER — Ambulatory Visit: Payer: Self-pay

## 2017-07-29 ENCOUNTER — Encounter (INDEPENDENT_AMBULATORY_CARE_PROVIDER_SITE_OTHER): Payer: Medicare Other | Admitting: Ophthalmology

## 2017-07-29 DIAGNOSIS — I10 Essential (primary) hypertension: Secondary | ICD-10-CM

## 2017-07-29 DIAGNOSIS — H35033 Hypertensive retinopathy, bilateral: Secondary | ICD-10-CM

## 2017-07-29 DIAGNOSIS — E113511 Type 2 diabetes mellitus with proliferative diabetic retinopathy with macular edema, right eye: Secondary | ICD-10-CM | POA: Diagnosis not present

## 2017-07-29 DIAGNOSIS — Z794 Long term (current) use of insulin: Secondary | ICD-10-CM

## 2017-07-29 DIAGNOSIS — E1142 Type 2 diabetes mellitus with diabetic polyneuropathy: Secondary | ICD-10-CM | POA: Diagnosis not present

## 2017-07-29 DIAGNOSIS — E113312 Type 2 diabetes mellitus with moderate nonproliferative diabetic retinopathy with macular edema, left eye: Secondary | ICD-10-CM

## 2017-07-29 DIAGNOSIS — E11311 Type 2 diabetes mellitus with unspecified diabetic retinopathy with macular edema: Secondary | ICD-10-CM

## 2017-07-29 DIAGNOSIS — H43813 Vitreous degeneration, bilateral: Secondary | ICD-10-CM

## 2017-07-29 MED ORDER — INSULIN DEGLUDEC 100 UNIT/ML ~~LOC~~ SOPN: 18.0000 [IU] | PEN_INJECTOR | Freq: Every day | SUBCUTANEOUS | 3 refills | Status: DC

## 2017-07-29 NOTE — Progress Notes (Addendum)
[name] presented to clinic for 1-week follow up on CGM and general medication management.   [employer] and Reviewed  Freestyle Libre sensor placed 07/22/2017  Week #1 of monitoring  7-day BG average 326, within target range 11% of the time  Above 180 mg/dL 89% of the time, highest around afternoon  Below 70 mg/dL 0% of the time, lowest around nighttime  Below 54 mg/dL 0% of the time  Coefficient of variation 33.7%  Standard deviation: 109.9 mg/dL  We discussed the patient's medication regimen to ensure adherence, given history of missed doses endorsed at last visit. She describes missing 1-2 doses of oral medications this week and missed all of her Saturday and Sunday doses of prandial insulin. Patient has been using less basal insulin (14 units QHS), prandial insulin (3 units with meals), and liraglutide (1.2 mg) than is described on the med list, though patient has reduced appetite and eating following loss of her daughter. She is no longer taking prandin. We also counseled the patient that she should always take bupropion and venlafaxine to reduce risk of insomnia described at last visit.  Following CGM results, patient was switched from 14 units of daily insulin detemir (Levemir) to 18 units of daily insulin degludec Tyler Aas).   Annual Urine Microalbumin was ordered and collected as indicated by the 2019 ADA guidelines.  Brett Fairy, PharmD Candidate

## 2017-07-29 NOTE — Progress Notes (Signed)
S: [name] is a 67 y.o. female reports to clinical pharmacist appointment for diabetes management.  Allergies  Allergen Reactions  . Gabapentin Other (See Comments)    Dizziness from 300 mg, tolerates 100 mg  . Penicillins Rash    Has patient had a PCN reaction causing immediate rash, facial/tongue/throat swelling, SOB or lightheadedness with hypotension: Yes Has patient had a PCN reaction causing severe rash involving mucus membranes or skin necrosis: No Has patient had a PCN reaction that required hospitalization: pt was in hospital at time of reaction Has patient had a PCN reaction occurring within the last 10 years: No If all of the above answers are "NO", then may proceed with Cephalosporin use.  . Sulfa Antibiotics Rash   Medication Sig  ALPHAGAN P 0.1 % SOLN Place 1 drop into both eyes 3 (three) times daily.  amLODipine (NORVASC) 10 MG tablet TAKE 1 TABLET(10 MG) BY MOUTH DAILY  aspirin EC 81 MG tablet Take 81 mg by mouth at bedtime.  buPROPion (WELLBUTRIN XL) 150 MG 24 hr tablet Take 1 tablet (150 mg total) by mouth daily.  calcium citrate-vitamin D (CITRACAL+D) 315-200 MG-UNIT tablet Take 1 tablet by mouth 2 (two) times daily.  Cholecalciferol (VITAMIN D3) 2000 units TABS Take 2,000 Units at bedtime by mouth.   glucose 4 GM chewable tablet Chew 4 tablets (16 g total) by mouth as needed for low blood sugar.  glucose blood (ONETOUCH VERIO) test strip USE TO CHECK BLOOD SUGAR TWICE DAILY  hydrochlorothiazide (MICROZIDE) 12.5 MG capsule TAKE 1 CAPSULE BY MOUTH EVERY DAY  Insulin Detemir (LEVEMIR FLEXTOUCH) 100 UNIT/ML Pen Inject 18 Units into the skin daily before breakfast. Patient not taking: Reported on 06/27/2017  insulin lispro (HUMALOG KWIKPEN) 100 UNIT/ML KiwkPen Inject 0.05-0.07 mLs (5-7 Units total) into the skin 3 (three) times daily. Patient not taking: Reported on 06/27/2017  Insulin Pen Needle (BD PEN NEEDLE NANO U/F) 32G X 4 MM MISC USE AS DIRECTED  ketorolac  (ACULAR) 0.5 % ophthalmic solution Place 1 drop into both eyes 3 (three) times daily.  levothyroxine (SYNTHROID, LEVOTHROID) 75 MCG tablet Take 1 tablet (75 mcg total) by mouth daily before breakfast.  liraglutide (VICTOZA) 18 MG/3ML SOPN ADMINISTER 1.8 MG UNDER THE SKIN DAILY  metFORMIN (GLUCOPHAGE) 1000 MG tablet TAKE 1 TABLET(1000 MG) BY MOUTH TWICE DAILY WITH A MEAL  Olopatadine HCl (PATADAY) 0.2 % SOLN Place 1 drop into both eyes daily as needed (dry eyes).   ONETOUCH DELICA LANCETS FINE MISC USE TO CHECK BLOOD SUGAR TWICE DAILY  Polyethyl Glycol-Propyl Glycol (SYSTANE) 0.4-0.3 % GEL ophthalmic gel Place 1 application daily as needed into both eyes (dry eyes/irritation).   repaglinide (PRANDIN) 0.5 MG tablet TAKE 1 TABLET BY MOUTH DAILY WITH SUPPER  rosuvastatin (CRESTOR) 20 MG tablet TAKE 1 TABLET(20 MG) BY MOUTH DAILY Patient not taking: Reported on 06/27/2017  traZODone (DESYREL) 50 MG tablet Take 1 tablet (50 mg total) by mouth at bedtime as needed for sleep.  venlafaxine XR (EFFEXOR-XR) 150 MG 24 hr capsule TAKE 1 CAPSULE BY MOUTH EVERY DAY WITH BREAKFAST   Past Medical History:  Diagnosis Date  . Arthritis   . Borderline glaucoma of both eyes   . Depression   . Diabetic polyneuropathy ([place])   . Diverticulosis of colon   . Dry eyes, bilateral   . Feeling of incomplete bladder emptying   . History of breast cancer oncologist-  dr Waymon Budge-- per lov note no recurrence   dx 04/ 1999 --- Stage 3B--  s/p  chemotherapy then right mastectomy then concurrent chemoradiation therapy  . History of urinary retention    01/ 2018  . Hydronephrosis of right kidney   . Hyperlipidemia   . Hypertension   . Hypothyroidism   . Insulin dependent type 2 diabetes mellitus [facility])    endocrinologist-  dr Cruzita Lederer---  last A1c 8.7 on 02-20-2016  . Lymphedema of upper extremity    right  . Macular degeneration, left eye    followed by dr Zigmund Daniel  . Renal insufficiency   . Urgency of urination     Social History   Socioeconomic History  . Marital status: Widowed    Spouse name: Not on file  . Number of children: Not on file  . Years of education: Not on file  . Highest education level: Not on file  Occupational History  . Not on file  Social Needs  . Financial resource strain: Not on file  . Food insecurity:    Worry: Not on file    Inability: Not on file  . Transportation needs:    Medical: Not on file    Non-medical: Not on file  Tobacco Use  . Smoking status: Never Smoker  . Smokeless tobacco: Never Used  Substance and Sexual Activity  . Alcohol use: No    Alcohol/week: 0.0 oz  . Drug use: No  . Sexual activity: Not on file  Lifestyle  . Physical activity:    Days per week: Not on file    Minutes per session: Not on file  . Stress: Not on file  Relationships  . Social connections:    Talks on phone: Not on file    Gets together: Not on file    Attends religious service: Not on file    Active member of club or organization: Not on file    Attends meetings of clubs or organizations: Not on file    Relationship status: Not on file  Other Topics Concern  . Not on file  Social History Narrative  . Not on file   Family History  Problem Relation Age of Onset  . Heart disease Father   . Diabetes Father   . Stroke Sister   . Anuerysm Sister 14       brain; maternal half-sister  . Heart disease Brother   . Anuerysm Brother 83       aortic; maternal half-brother  . Breast cancer Daughter 12       negative genetic testing in 2015  . Heart attack Daughter        10-47  . Diabetes Paternal Grandmother   . Stroke Paternal Grandfather   . Anuerysm Brother        NOS type; full brother  . Fibroids Daughter        s/p hysterectomy at 6y  . Brain cancer Maternal Uncle        dx. older than 64; NOS type  . Brain cancer Cousin        maternal 1st cousin; d. early 15s; NOS type  . Deafness Paternal Uncle        prelingual   O:    Component Value  Date/Time   CHOL 183 05/28/2016 1032   HDL 52 05/28/2016 1032   TRIG 82 05/28/2016 1032   AST 22 06/27/2017 1742   AST 16 08/09/2013 0934   ALT 20 06/27/2017 1742   ALT 12 08/09/2013 0934   NA 139 06/29/2017 0912   NA 133 (L) 04/01/2017  1004   NA 143 08/09/2013 0934   K 4.0 06/29/2017 0912   K 3.4 (L) 08/09/2013 0934   CL 106 06/29/2017 0912   CO2 25 06/29/2017 0912   CO2 25 08/09/2013 0934   GLUCOSE 285 (H) 06/29/2017 0912   GLUCOSE 192 (H) 08/09/2013 0934   HGBA1C 14.0 (A) 07/08/2017 1027   HGBA1C 12.8 (H) 06/28/2017 0241   BUN 12 06/29/2017 0912   BUN 11 04/01/2017 1004   BUN 27.3 (H) 08/09/2013 0934   CREATININE 0.72 06/29/2017 0912   CREATININE 1.15 (H) 05/16/2014 1036   CREATININE 1.4 (H) 08/09/2013 0934   CALCIUM 8.9 06/29/2017 0912   CALCIUM 10.3 08/09/2013 0934   GFRNONAA >60 06/29/2017 0912   GFRNONAA 51 (L) 05/16/2014 1036   GFRAA >60 06/29/2017 0912   GFRAA 59 (L) 05/16/2014 1036   WBC 7.0 06/29/2017 0912   HGB 12.0 06/29/2017 0912   HGB 11.7 12/09/2016 1449   HGB 11.9 08/09/2013 0934   HCT 35.1 (L) 06/29/2017 0912   HCT 34.5 12/09/2016 1449   HCT 35.8 08/09/2013 0934   PLT 232 06/29/2017 0912   PLT 283 12/09/2016 1449   TSH 0.355 (L) 07/22/2017 1154   Ht Readings from Last 2 Encounters:  07/22/17 5\' 2"  (1.575 m)  07/08/17 5' 2.5" (1.588 m)   Wt Readings from Last 2 Encounters:  07/22/17 122 lb 3.2 oz (55.4 kg)  07/08/17 125 lb (56.7 kg)   There is no height or weight on file to calculate BMI. BP Readings from Last 3 Encounters:  07/22/17 (!) 155/69  07/08/17 126/62  06/29/17 (!) 143/82    A/P: Patient was seen in clinic with Brett Fairy, PharmD candidate. I agree with the assessment and plan of care documented. Of note, the Levemir prescription stated 18 units daily although patient was only taking 14 units daily. Clarified when switching to [place], patient is to take 18 units daily, she verbalized understanding.  Patient has noticed  decreased appetite which may be related to mental health for which patient is followed by Dr. Adele Schilder. May consider switching trazodone to mirtazepine in the future.    30 minutes spent face-to-face with the patient during the encounter. 50% of time spent on education. 50% of time was spent on assessment, plan, and coordination of care.

## 2017-07-29 NOTE — Patient Instructions (Signed)
Patient educated about medication as defined in this encounter and verbalized understanding by repeating back instructions provided.   

## 2017-07-30 ENCOUNTER — Ambulatory Visit: Payer: Medicare Other | Admitting: Internal Medicine

## 2017-07-30 DIAGNOSIS — Z0289 Encounter for other administrative examinations: Secondary | ICD-10-CM

## 2017-07-30 LAB — MICROALBUMIN / CREATININE URINE RATIO
Creatinine, Urine: 104.6 mg/dL
Microalb/Creat Ratio: 26 mg/g creat (ref 0.0–30.0)
Microalbumin, Urine: 27.2 ug/mL

## 2017-08-05 ENCOUNTER — Ambulatory Visit (INDEPENDENT_AMBULATORY_CARE_PROVIDER_SITE_OTHER): Payer: Medicare Other | Admitting: Pharmacist

## 2017-08-05 DIAGNOSIS — Z794 Long term (current) use of insulin: Secondary | ICD-10-CM

## 2017-08-05 DIAGNOSIS — E1149 Type 2 diabetes mellitus with other diabetic neurological complication: Secondary | ICD-10-CM

## 2017-08-05 DIAGNOSIS — E1142 Type 2 diabetes mellitus with diabetic polyneuropathy: Secondary | ICD-10-CM | POA: Diagnosis not present

## 2017-08-05 LAB — GLUCOSE, CAPILLARY: Glucose-Capillary: 138 mg/dL — ABNORMAL HIGH (ref 70–99)

## 2017-08-05 NOTE — Progress Notes (Signed)
S: [name] is a 67 y.o. female reports to clinical pharmacist appointment for continuous glucose monitoring. Patient did not bring medication bottles. Patient is accompanied by sister in law, who assist at home with medication management.  Allergies  Allergen Reactions  . Gabapentin Other (See Comments)    Dizziness from 300 mg, tolerates 100 mg  . Penicillins Rash    Has patient had a PCN reaction causing immediate rash, facial/tongue/throat swelling, SOB or lightheadedness with hypotension: Yes Has patient had a PCN reaction causing severe rash involving mucus membranes or skin necrosis: No Has patient had a PCN reaction that required hospitalization: pt was in hospital at time of reaction Has patient had a PCN reaction occurring within the last 10 years: No If all of the above answers are "NO", then may proceed with Cephalosporin use.  . Sulfa Antibiotics Rash   Medication Sig  ALPHAGAN P 0.1 % SOLN Place 1 drop into both eyes 3 (three) times daily.  amLODipine (NORVASC) 10 MG tablet TAKE 1 TABLET(10 MG) BY MOUTH DAILY  aspirin EC 81 MG tablet Take 81 mg by mouth at bedtime.  buPROPion (WELLBUTRIN XL) 150 MG 24 hr tablet Take 1 tablet (150 mg total) by mouth daily.  calcium citrate-vitamin D (CITRACAL+D) 315-200 MG-UNIT tablet Take 1 tablet by mouth 2 (two) times daily.  Cholecalciferol (VITAMIN D3) 2000 units TABS Take 2,000 Units at bedtime by mouth.   glucose 4 GM chewable tablet Chew 4 tablets (16 g total) by mouth as needed for low blood sugar.  glucose blood (ONETOUCH VERIO) test strip USE TO CHECK BLOOD SUGAR TWICE DAILY  hydrochlorothiazide (MICROZIDE) 12.5 MG capsule TAKE 1 CAPSULE BY MOUTH EVERY DAY  insulin degludec (TRESIBA FLEXTOUCH) 100 UNIT/ML SOPN FlexTouch Pen Inject 0.18 mLs (18 Units total) into the skin daily.  insulin lispro (HUMALOG KWIKPEN) 100 UNIT/ML KiwkPen Inject 0.05-0.07 mLs (5-7 Units total) into the skin 3 (three) times daily. Patient not taking:  Reported on 06/27/2017  Insulin Pen Needle (BD PEN NEEDLE NANO U/F) 32G X 4 MM MISC USE AS DIRECTED  ketorolac (ACULAR) 0.5 % ophthalmic solution Place 1 drop into both eyes 3 (three) times daily.  levothyroxine (SYNTHROID, LEVOTHROID) 75 MCG tablet Take 1 tablet (75 mcg total) by mouth daily before breakfast.  liraglutide (VICTOZA) 18 MG/3ML SOPN ADMINISTER 1.8 MG UNDER THE SKIN DAILY  metFORMIN (GLUCOPHAGE) 1000 MG tablet TAKE 1 TABLET(1000 MG) BY MOUTH TWICE DAILY WITH A MEAL  Olopatadine HCl (PATADAY) 0.2 % SOLN Place 1 drop into both eyes daily as needed (dry eyes).   ONETOUCH DELICA LANCETS FINE MISC USE TO CHECK BLOOD SUGAR TWICE DAILY  Polyethyl Glycol-Propyl Glycol (SYSTANE) 0.4-0.3 % GEL ophthalmic gel Place 1 application daily as needed into both eyes (dry eyes/irritation).   repaglinide (PRANDIN) 0.5 MG tablet TAKE 1 TABLET BY MOUTH DAILY WITH SUPPER  rosuvastatin (CRESTOR) 20 MG tablet TAKE 1 TABLET(20 MG) BY MOUTH DAILY Patient not taking: Reported on 06/27/2017  traZODone (DESYREL) 50 MG tablet Take 1 tablet (50 mg total) by mouth at bedtime as needed for sleep.  venlafaxine XR (EFFEXOR-XR) 150 MG 24 hr capsule TAKE 1 CAPSULE BY MOUTH EVERY DAY WITH BREAKFAST   Past Medical History:  Diagnosis Date  . Arthritis   . Borderline glaucoma of both eyes   . Depression   . Diabetic polyneuropathy ([place])   . Diverticulosis of colon   . Dry eyes, bilateral   . Feeling of incomplete bladder emptying   . History of breast  cancer oncologist-  dr Waymon Budge-- per lov note no recurrence   dx 04/ 1999 --- Stage 3B-- s/p  chemotherapy then right mastectomy then concurrent chemoradiation therapy  . History of urinary retention    01/ 2018  . Hydronephrosis of right kidney   . Hyperlipidemia   . Hypertension   . Hypothyroidism   . Insulin dependent type 2 diabetes mellitus [facility])    endocrinologist-  dr Cruzita Lederer---  last A1c 8.7 on 02-20-2016  . Lymphedema of upper extremity    right  .  Macular degeneration, left eye    followed by dr Zigmund Daniel  . Renal insufficiency   . Urgency of urination    Social History   Socioeconomic History  . Marital status: Widowed    Spouse name: Not on file  . Number of children: Not on file  . Years of education: Not on file  . Highest education level: Not on file  Occupational History  . Not on file  Social Needs  . Financial resource strain: Not on file  . Food insecurity:    Worry: Not on file    Inability: Not on file  . Transportation needs:    Medical: Not on file    Non-medical: Not on file  Tobacco Use  . Smoking status: Never Smoker  . Smokeless tobacco: Never Used  Substance and Sexual Activity  . Alcohol use: No    Alcohol/week: 0.0 oz  . Drug use: No  . Sexual activity: Not on file  Lifestyle  . Physical activity:    Days per week: Not on file    Minutes per session: Not on file  . Stress: Not on file  Relationships  . Social connections:    Talks on phone: Not on file    Gets together: Not on file    Attends religious service: Not on file    Active member of club or organization: Not on file    Attends meetings of clubs or organizations: Not on file    Relationship status: Not on file  Other Topics Concern  . Not on file  Social History Narrative  . Not on file   Family History  Problem Relation Age of Onset  . Heart disease Father   . Diabetes Father   . Stroke Sister   . Anuerysm Sister 78       brain; maternal half-sister  . Heart disease Brother   . Anuerysm Brother 55       aortic; maternal half-brother  . Breast cancer Daughter 53       negative genetic testing in 2015  . Heart attack Daughter        79-47  . Diabetes Paternal Grandmother   . Stroke Paternal Grandfather   . Anuerysm Brother        NOS type; full brother  . Fibroids Daughter        s/p hysterectomy at 25y  . Brain cancer Maternal Uncle        dx. older than 55; NOS type  . Brain cancer Cousin        maternal 1st  cousin; d. early 30s; NOS type  . Deafness Paternal Uncle        prelingual    O:    Component Value Date/Time   CHOL 183 05/28/2016 1032   HDL 52 05/28/2016 1032   TRIG 82 05/28/2016 1032   AST 22 06/27/2017 1742   AST 16 08/09/2013 0934   ALT 20 06/27/2017  1742   ALT 12 08/09/2013 0934   NA 139 06/29/2017 0912   NA 133 (L) 04/01/2017 1004   NA 143 08/09/2013 0934   K 4.0 06/29/2017 0912   K 3.4 (L) 08/09/2013 0934   CL 106 06/29/2017 0912   CO2 25 06/29/2017 0912   CO2 25 08/09/2013 0934   GLUCOSE 285 (H) 06/29/2017 0912   GLUCOSE 192 (H) 08/09/2013 0934   HGBA1C 14.0 (A) 07/08/2017 1027   HGBA1C 12.8 (H) 06/28/2017 0241   BUN 12 06/29/2017 0912   BUN 11 04/01/2017 1004   BUN 27.3 (H) 08/09/2013 0934   CREATININE 0.72 06/29/2017 0912   CREATININE 1.15 (H) 05/16/2014 1036   CREATININE 1.4 (H) 08/09/2013 0934   CALCIUM 8.9 06/29/2017 0912   CALCIUM 10.3 08/09/2013 0934   GFRNONAA >60 06/29/2017 0912   GFRNONAA 51 (L) 05/16/2014 1036   GFRAA >60 06/29/2017 0912   GFRAA 59 (L) 05/16/2014 1036   WBC 7.0 06/29/2017 0912   HGB 12.0 06/29/2017 0912   HGB 11.7 12/09/2016 1449   HGB 11.9 08/09/2013 0934   HCT 35.1 (L) 06/29/2017 0912   HCT 34.5 12/09/2016 1449   HCT 35.8 08/09/2013 0934   PLT 232 06/29/2017 0912   PLT 283 12/09/2016 1449   TSH 0.355 (L) 07/22/2017 1154   Ht Readings from Last 2 Encounters:  07/22/17 5\' 2"  (1.575 m)  07/08/17 5' 2.5" (1.588 m)   Wt Readings from Last 2 Encounters:  07/22/17 122 lb 3.2 oz (55.4 kg)  07/08/17 125 lb (56.7 kg)   There is no height or weight on file to calculate BMI. BP Readings from Last 3 Encounters:  07/22/17 (!) 155/69  07/08/17 126/62  06/29/17 (!) 143/82    A/P: [employer] Results Downloaded and Reviewed  Week #2 of monitoring, sensor removed today.  14-day BG average 296, within target range 21% of the time  Above 180 mg/dL 78% of the time, highest around evening, nighttime  Ave BG improved from  326 last week. Patient reports poor appetite, some days she does not eat much. She states she has been working on increasing her intake and plans to try nutrition drinks such as Glucerna. Advised patient to work on consistent dietary intake to allow for safer DM medication titration. She may benefit from discontinuation of bupropion and addition of mirtazapine. Notably, she states she is not currently taking trazodone. No changes were made to her DM regimen today.  Patient states her home BG meter is not working. She requests help obtaining a personal [employer]. Do not see a BG meter on med list, and test strip prescription is outdated. Will send a new prescription for home BG meter and order for personal Freestyle [employer]re.  Follow up: 2 weeks

## 2017-08-06 DIAGNOSIS — C50911 Malignant neoplasm of unspecified site of right female breast: Secondary | ICD-10-CM | POA: Diagnosis not present

## 2017-08-06 MED ORDER — ONETOUCH VERIO W/DEVICE KIT: 1.0000 | PACK | Freq: Once | 0 refills | Status: DC

## 2017-08-06 MED ORDER — GLUCOSE BLOOD VI STRP: ORAL_STRIP | 11 refills | Status: DC

## 2017-08-06 MED ORDER — GLUCERNA SNACK SHAKE PO LIQD: 1.0000 | Freq: Three times a day (TID) | ORAL | 3 refills | Status: AC | PRN

## 2017-08-06 MED ORDER — ONETOUCH DELICA LANCETS FINE MISC: 11 refills | Status: DC

## 2017-08-06 NOTE — Addendum Note (Signed)
Addended by: Forde Dandy on: 08/06/2017 11:44 AM   Modules accepted: Orders

## 2017-08-07 DIAGNOSIS — H40013 Open angle with borderline findings, low risk, bilateral: Secondary | ICD-10-CM | POA: Diagnosis not present

## 2017-08-07 DIAGNOSIS — H31093 Other chorioretinal scars, bilateral: Secondary | ICD-10-CM | POA: Diagnosis not present

## 2017-08-07 DIAGNOSIS — Z961 Presence of intraocular lens: Secondary | ICD-10-CM | POA: Diagnosis not present

## 2017-08-10 ENCOUNTER — Other Ambulatory Visit: Payer: Self-pay | Admitting: Internal Medicine

## 2017-08-14 ENCOUNTER — Encounter: Payer: Self-pay | Admitting: Dietician

## 2017-08-14 ENCOUNTER — Encounter (INDEPENDENT_AMBULATORY_CARE_PROVIDER_SITE_OTHER): Payer: Self-pay

## 2017-08-14 ENCOUNTER — Ambulatory Visit: Payer: Medicare Other | Admitting: Dietician

## 2017-08-14 ENCOUNTER — Ambulatory Visit: Payer: Medicare Other | Admitting: Pharmacist

## 2017-08-14 DIAGNOSIS — Z794 Long term (current) use of insulin: Principal | ICD-10-CM

## 2017-08-14 DIAGNOSIS — E1142 Type 2 diabetes mellitus with diabetic polyneuropathy: Secondary | ICD-10-CM

## 2017-08-14 LAB — GLUCOSE, CAPILLARY: Glucose-Capillary: 370 mg/dL — ABNORMAL HIGH (ref 70–99)

## 2017-08-14 NOTE — Patient Instructions (Signed)
Racquelle,   Hope you are feeling better. Please call me wit any questions or concerns.   [name] [phone]

## 2017-08-14 NOTE — Progress Notes (Signed)
[name] presented for medication management. She was not able to bring her medications with her today or her glucose meter or log.  Lab came and read her blood glucose level which was 370 today. We inquired about food intake and medications usage in regards to the high level today. She had a banana this morning and her tresiba insulin. She did not have her dose of victoza last night and due to feeling sick missed her medications yesterday.  She is still having difficulty with sleeping mentioning that she gets to bed late and sleeps till later in the day. Mentioned when she does sleep, she sleeps deeply and is difficult to awaken. Pt mentions taking Venlafaxine and Bupropion at night, which could be effecting her sleep.  Asked if pt needed any refills, she said she did not receive her decreased dose of Levothyroxine (15mcg) from [facility] or the glucerna shakes.  A/P:  Diabetes: We did not make any changes today but, stressed the importance of taking her medications consistently. Asked her to keep a log to bring next week to his visit as well as her medications.  Sleep: As mentioned at last visit take Venlafaxine and Bupropion in the morning to possibly help with her sleep.  Refills: Called [facility] pharmacy in regards to levothyroxine and glucerna. They will be refilling the levothyroxine.  Patient will follow up in one week on 08/21/2017.

## 2017-08-14 NOTE — Progress Notes (Signed)
Documentation: Patient attended diabetes education group visit today for 60 minutes. The program included successes and challenges over the past few weeks. We discussed  healthy food choices including portions sizes, reading labels, why fiber is important. Our activity was making chia seed pudding.   Follow up is scheduled in 1 month for diabetes group meeting.  Debera Lat, RD 08/14/2017 12:04 PM.

## 2017-08-15 ENCOUNTER — Encounter: Payer: Self-pay | Admitting: Internal Medicine

## 2017-08-15 NOTE — Progress Notes (Signed)
Pharmacy was contacted and levothyroxine dose was clarified. Patient was seen in clinic with Madlyn Frankel, PharmD candidate. I agree with the assessment and plan of care documented.

## 2017-08-18 ENCOUNTER — Inpatient Hospital Stay: Payer: Medicare Other | Admitting: Adult Health

## 2017-08-18 DIAGNOSIS — C50911 Malignant neoplasm of unspecified site of right female breast: Secondary | ICD-10-CM | POA: Diagnosis not present

## 2017-08-18 NOTE — Progress Notes (Deleted)
CLINIC:  Survivorship   REASON FOR VISIT:  Routine follow-up for history of breast cancer.   BRIEF ONCOLOGIC HISTORY:   No history exists.     INTERVAL HISTORY:  [name] presents to the Survivorship Clinic today for routine follow-up for her history of breast cancer.  Overall, she reports feeling quite well. ***    REVIEW OF SYSTEMS:  Review of Systems - Oncology Breast: Denies any new nodularity, masses, tenderness, nipple changes, or nipple discharge.       PAST MEDICAL/SURGICAL HISTORY:  Past Medical History:  Diagnosis Date  . Arthritis   . Borderline glaucoma of both eyes   . Depression   . Diabetic polyneuropathy ([place])   . Diverticulosis of colon   . Dry eyes, bilateral   . Feeling of incomplete bladder emptying   . History of breast cancer oncologist-  dr Waymon Budge-- per lov note no recurrence   dx 04/ 1999 --- Stage 3B-- s/p  chemotherapy then right mastectomy then concurrent chemoradiation therapy  . History of urinary retention    01/ 2018  . Hydronephrosis of right kidney   . Hyperlipidemia   . Hypertension   . Hypothyroidism   . Insulin dependent type 2 diabetes mellitus [facility])    endocrinologist-  dr Cruzita Lederer---  last A1c 8.7 on 02-20-2016  . Lymphedema of upper extremity    right  . Macular degeneration, left eye    followed by dr Zigmund Daniel  . Renal insufficiency   . Urgency of urination    Past Surgical History:  Procedure Laterality Date  . CARPAL TUNNEL RELEASE Right 2017   and tendon repair  . CATARACT EXTRACTION W/ INTRAOCULAR LENS  IMPLANT, BILATERAL  2017  . CYSTO/  RIGHT RETROGRADE PYELOGRAM/  UNROOFING RIGHT URETEROCELE  09/18/2000  . CYSTOSCOPY/RETROGRADE/URETEROSCOPY Right 05/09/2016   Procedure: CYSTOSCOPY and right RETROGRADE;  Surgeon: Irine Seal, MD;  Location: [facility];  Service: Urology;  Laterality: Right;  . FINGER SURGERY Left x2 prior to 09-09-2014  . I&D EXTREMITY Left 09/09/2014   Procedure:  IRRIGATION AND DEBRIDEMENT LEFT THUMB DISTAL PHALANX;  Surgeon: Daryll Brod, MD;  Location: [place] [facility];  Service: Orthopedics;  Laterality: Left;  [name] MASTECTOMY Right 1999   w/  Node dissection's  . PORT-A-CATH REMOVAL  05/01/1999  . TUBAL LIGATION    . VAGINAL HYSTERECTOMY  1970's  . WRIST GANGLION EXCISION Right 2016     ALLERGIES:  Allergies  Allergen Reactions  . Gabapentin Other (See Comments)    Dizziness from 300 mg, tolerates 100 mg  . Penicillins Rash    Has patient had a PCN reaction causing immediate rash, facial/tongue/throat swelling, SOB or lightheadedness with hypotension: Yes Has patient had a PCN reaction causing severe rash involving mucus membranes or skin necrosis: No Has patient had a PCN reaction that required hospitalization: pt was in hospital at time of reaction Has patient had a PCN reaction occurring within the last 10 years: No If all of the above answers are "NO", then may proceed with Cephalosporin use.  . Sulfa Antibiotics Rash     CURRENT MEDICATIONS:  Outpatient Encounter Medications as of 08/18/2017  Medication Sig Note  . ALPHAGAN P 0.1 % SOLN Place 1 drop into both eyes 3 (three) times daily. 06/27/2017: LF 06/09/17 45 ds  . amLODipine (NORVASC) 10 MG tablet TAKE 1 TABLET(10 MG) BY MOUTH DAILY   . aspirin EC 81 MG tablet Take 81 mg by mouth at bedtime.   [name]  buPROPion (WELLBUTRIN XL) 150 MG 24 hr tablet Take 1 tablet (150 mg total) by mouth daily.   . calcium citrate-vitamin D (CITRACAL+D) 315-200 MG-UNIT tablet Take 1 tablet by mouth 2 (two) times daily.   . Cholecalciferol (VITAMIN D3) 2000 units TABS Take 2,000 Units at bedtime by mouth.    [name] glucose 4 GM chewable tablet Chew 4 tablets (16 g total) by mouth as needed for low blood sugar.   [name] glucose blood (ONETOUCH VERIO) test strip USE TO CHECK BLOOD SUGAR 2-3 TIMES DAILY   . hydrochlorothiazide (MICROZIDE) 12.5 MG capsule TAKE 1 CAPSULE BY MOUTH EVERY DAY   . insulin degludec  (TRESIBA FLEXTOUCH) 100 UNIT/ML SOPN FlexTouch Pen Inject 0.18 mLs (18 Units total) into the skin daily.   . insulin lispro (HUMALOG KWIKPEN) 100 UNIT/ML KiwkPen Inject 0.05-0.07 mLs (5-7 Units total) into the skin 3 (three) times daily. (Patient not taking: Reported on 06/27/2017) 06/27/2017: No record of pt having filled rx at her pharmacy   . Insulin Pen Needle (BD PEN NEEDLE NANO U/F) 32G X 4 MM MISC USE AS DIRECTED   . ketorolac (ACULAR) 0.5 % ophthalmic solution Place 1 drop into both eyes 3 (three) times daily. 06/27/2017: LF 06/11/17, 30 ds   . levothyroxine (SYNTHROID, LEVOTHROID) 75 MCG tablet Take 1 tablet (75 mcg total) by mouth daily before breakfast.   . liraglutide (VICTOZA) 18 MG/3ML SOPN ADMINISTER 1.8 MG UNDER THE SKIN DAILY 06/27/2017: LF 06/11/17, 30 ds   . metFORMIN (GLUCOPHAGE) 1000 MG tablet TAKE 1 TABLET(1000 MG) BY MOUTH TWICE DAILY WITH A MEAL   . Nutritional Supplements (GLUCERNA SNACK SHAKE) LIQD Take 1 Dose by mouth 3 (three) times daily as needed.   . Olopatadine HCl (PATADAY) 0.2 % SOLN Place 1 drop into both eyes daily as needed (dry eyes).    Glory Rosebush DELICA LANCETS FINE MISC USE TO CHECK BLOOD SUGAR 2-3 times DAILY   . Polyethyl Glycol-Propyl Glycol (SYSTANE) 0.4-0.3 % GEL ophthalmic gel Place 1 application daily as needed into both eyes (dry eyes/irritation).    . rosuvastatin (CRESTOR) 20 MG tablet TAKE 1 TABLET(20 MG) BY MOUTH DAILY (Patient not taking: Reported on 06/27/2017) 06/27/2017: No record of pt having filled rx at her pharmacy   . venlafaxine XR (EFFEXOR-XR) 150 MG 24 hr capsule TAKE 1 CAPSULE BY MOUTH EVERY DAY WITH BREAKFAST    No facility-administered encounter medications on file as of 08/18/2017.      ONCOLOGIC FAMILY HISTORY:  Family History  Problem Relation Age of Onset  . Heart disease Father   . Diabetes Father   . Stroke Sister   . Anuerysm Sister 57       brain; maternal half-sister  . Heart disease Brother   . Anuerysm Brother 100        aortic; maternal half-brother  . Breast cancer Daughter 14       negative genetic testing in 2015  . Heart attack Daughter        46-47  . Diabetes Paternal Grandmother   . Stroke Paternal Grandfather   . Anuerysm Brother        NOS type; full brother  . Fibroids Daughter        s/p hysterectomy at 62y  . Brain cancer Maternal Uncle        dx. older than 20; NOS type  . Brain cancer Cousin        maternal 1st cousin; d. early 41s; NOS type  . Deafness Paternal  Uncle        prelingual    GENETIC COUNSELING/TESTING: ***  SOCIAL HISTORY:  [name] is /single/married/divorced/widowed/separated and lives alone/with her spouse/family/friend in (city), [place].  She has (#) children and they live in (city).  [name] is currently retired/disabled/working part-time/full-time as ***.  She denies any current or history of tobacco, alcohol, or illicit drug use.     PHYSICAL EXAMINATION:  Vital Signs: There were no vitals filed for this visit. There were no vitals filed for this visit. General: Well-nourished, well-appearing female in no acute distress.  Unaccompanied/Accompanied by***** today.   HEENT: Head is normocephalic.  Pupils equal and reactive to light. Conjunctivae clear without exudate.  Sclerae anicteric. Oral mucosa is pink, moist.  Oropharynx is pink without lesions or erythema.  Lymph: No cervical, supraclavicular, or infraclavicular lymphadenopathy noted on palpation.  Cardiovascular: Regular rate and rhythm.[name] Respiratory: Clear to auscultation bilaterally. Chest expansion symmetric; breathing non-labored.  Breast Exam:  -Left breast: No appreciable masses on palpation. No skin redness, thickening, or peau d'orange appearance; no nipple retraction or nipple discharge; mild distortion in symmetry at previous lumpectomy site***healed scar without erythema or nodularity.  -Right breast: No appreciable masses on palpation. No skin redness, thickening, or peau  d'orange appearance; no nipple retraction or nipple discharge; mild distortion in symmetry at previous lumpectomy site***healed scar without erythema or nodularity. -Axilla: No axillary adenopathy bilaterally.  GI: Abdomen soft and round; non-tender, non-distended. Bowel sounds normoactive. No hepatosplenomegaly.   GU: Deferred.  Neuro: No focal deficits. Steady gait.  Psych: Mood and affect normal and appropriate for situation.  MSK: No focal spinal tenderness to palpation, full range of motion in bilateral upper extremities Extremities: No edema. Skin: Warm and dry.  LABORATORY DATA:  None for this visit***   DIAGNOSTIC IMAGING:  Most recent mammogram: ***    ASSESSMENT AND PLAN:  [name] is a pleasant 67 y.o. female with history of Stage *** right/left breast invasive ductal carcinoma, ER+/PR+/HER2-, diagnosed in (date), treated with lumpectomy, adjuvant radiation therapy, and anti-estrogen therapy with *** beginning in (date).  She presents to the Survivorship Clinic for surveillance and routine follow-up.   1. History of breast cancer:  [name] is currently clinically and radiographically without evidence of disease or recurrence of breast cancer. She will be due for mammogram in ***; orders placed today.  She will continue her anti-estrogen therapy with ***, with plans to continue for *** years.  She will return to the cancer center to see her medical oncologist, Dr. ***, in ***/2018.  I encouraged her to call me with any questions or concerns before her next visit at the cancer center, and I would be happy to see her sooner, if needed.    #. Problem(s) at Visit___________________.  #. Bone health:  Given [name] age, history of breast cancer, and her current anti-estrogen therapy with ________, she is at risk for bone demineralization. Her last DEXA scan was on **/**/20**.  In the meantime, she was encouraged to increase her consumption of foods rich in calcium, as well as  increase her weight-bearing activities.  She was given education on specific food and activities to promote bone health.  #. Cancer screening:  Due to [name] history and her age, she should receive screening for skin cancers, colon cancer, and ***gynecologic cancers. She was encouraged to follow-up with her PCP for appropriate cancer screenings.   #. Health maintenance and wellness promotion: [name] was encouraged to consume 5-7  servings of fruits and vegetables per day. She was also encouraged to engage in moderate to vigorous exercise for 30 minutes per day most days of the week. She was instructed to limit her alcohol consumption and continue to abstain from tobacco use/was encouraged stop smoking.  ***    Dispo:  -Return to cancer center ***   A total of (30) minutes of face-to-face time was spent with this patient with greater than 50% of that time in counseling and care-coordination.   Gardenia Phlegm, [place] [phone]   Note: PRIMARY CARE PROVIDER Aldine Contes, [place] [phone]

## 2017-08-20 DIAGNOSIS — C50911 Malignant neoplasm of unspecified site of right female breast: Secondary | ICD-10-CM | POA: Diagnosis not present

## 2017-08-21 ENCOUNTER — Encounter: Payer: Self-pay | Admitting: Pharmacist

## 2017-08-21 DIAGNOSIS — H47292 Other optic atrophy, left eye: Secondary | ICD-10-CM | POA: Diagnosis not present

## 2017-08-21 DIAGNOSIS — H40013 Open angle with borderline findings, low risk, bilateral: Secondary | ICD-10-CM | POA: Diagnosis not present

## 2017-08-22 ENCOUNTER — Ambulatory Visit: Payer: Medicare Other | Admitting: Podiatry

## 2017-08-25 ENCOUNTER — Encounter: Payer: Self-pay | Admitting: Adult Health

## 2017-08-25 ENCOUNTER — Inpatient Hospital Stay: Payer: Medicare Other | Attending: Adult Health | Admitting: Adult Health

## 2017-08-25 ENCOUNTER — Telehealth: Payer: Self-pay | Admitting: Adult Health

## 2017-08-25 VITALS — BP 140/97 | HR 93 | Temp 98.5°F | Resp 18 | Ht 62.0 in | Wt 122.6 lb

## 2017-08-25 DIAGNOSIS — I10 Essential (primary) hypertension: Secondary | ICD-10-CM

## 2017-08-25 DIAGNOSIS — R634 Abnormal weight loss: Secondary | ICD-10-CM

## 2017-08-25 DIAGNOSIS — Z9221 Personal history of antineoplastic chemotherapy: Secondary | ICD-10-CM | POA: Diagnosis not present

## 2017-08-25 DIAGNOSIS — M1990 Unspecified osteoarthritis, unspecified site: Secondary | ICD-10-CM

## 2017-08-25 DIAGNOSIS — Z79899 Other long term (current) drug therapy: Secondary | ICD-10-CM

## 2017-08-25 DIAGNOSIS — H3530 Unspecified macular degeneration: Secondary | ICD-10-CM

## 2017-08-25 DIAGNOSIS — Z923 Personal history of irradiation: Secondary | ICD-10-CM

## 2017-08-25 DIAGNOSIS — E785 Hyperlipidemia, unspecified: Secondary | ICD-10-CM | POA: Diagnosis not present

## 2017-08-25 DIAGNOSIS — Z170 Estrogen receptor positive status [ER+]: Secondary | ICD-10-CM | POA: Diagnosis not present

## 2017-08-25 DIAGNOSIS — R5383 Other fatigue: Secondary | ICD-10-CM

## 2017-08-25 DIAGNOSIS — Z794 Long term (current) use of insulin: Secondary | ICD-10-CM

## 2017-08-25 DIAGNOSIS — Z853 Personal history of malignant neoplasm of breast: Secondary | ICD-10-CM

## 2017-08-25 DIAGNOSIS — E1142 Type 2 diabetes mellitus with diabetic polyneuropathy: Secondary | ICD-10-CM | POA: Diagnosis not present

## 2017-08-25 DIAGNOSIS — F4321 Adjustment disorder with depressed mood: Secondary | ICD-10-CM

## 2017-08-25 DIAGNOSIS — F418 Other specified anxiety disorders: Secondary | ICD-10-CM

## 2017-08-25 DIAGNOSIS — E039 Hypothyroidism, unspecified: Secondary | ICD-10-CM

## 2017-08-25 DIAGNOSIS — Z7982 Long term (current) use of aspirin: Secondary | ICD-10-CM | POA: Diagnosis not present

## 2017-08-25 DIAGNOSIS — Z9011 Acquired absence of right breast and nipple: Secondary | ICD-10-CM | POA: Diagnosis not present

## 2017-08-25 NOTE — Progress Notes (Signed)
CLINIC:  Survivorship   REASON FOR VISIT:  Routine follow-up for history of breast cancer.   BRIEF ONCOLOGIC HISTORY:  Per Dr. Calton Dach last note in 07/2015: This patient was diagnosed with breast cancer of the incidental finding of a lump on her chest when she presented to her doctor with coughing. I do not have all the available records. She was initially diagnosed with a Stage III B lymph node positive cancer of the right breast in April of 1999, treated with induction chemotherapy followed by a right mastectomy then radiation and additional chemotherapy. She achieved a durable response with no evidence for recurrence. Due to her young age, she had genetic counselor and was tested negative for BRCA mutation. She never received adjuvant tamoxifen because she recalled that he was issued a receptor negative. The patient has 4 children and her first child was born at age of 33. She never breast-feed any of her children. She did not recall taking hormone replacement or birth control pill. She had a partial hysterectomy at a young age due to heavy menstruation.    INTERVAL HISTORY:  [name] presents to the Survivorship [facility] today for routine follow-up for her history of breast cancer.   Allisyn tells me that she has had a tough year because she lost her daughter to a brain tumor.  She is sad because of this.  Yesterday would have been her birthday.  She has been losing weight due to this.    She is depressed.  She has lost her appetite.  She is down 10 pounds since last year.  She has type two diabetes.  She is taking wellbutrin daily which has been taking long term (1999).  She is requesting that someone prescribe her Glucerna.    She has an appointment for evaluation related to her depression next month.  She denies any suicidal ideation.  She is consumed by anxiety and having a difficult time functioning.    REVIEW OF SYSTEMS:  Review of Systems  Constitutional: Positive for fatigue  and unexpected weight change. Negative for appetite change and chills.  HENT:   Negative for hearing loss, lump/mass and trouble swallowing.   Eyes: Negative for eye problems and icterus.  Respiratory: Negative for chest tightness, cough and shortness of breath.   Cardiovascular: Negative for chest pain, leg swelling and palpitations.  Gastrointestinal: Negative for abdominal distention, abdominal pain, constipation, diarrhea, nausea and vomiting.  Endocrine: Negative for hot flashes.  Skin: Negative for itching and rash.  Neurological: Negative for dizziness, extremity weakness, headaches and numbness.  Hematological: Negative for adenopathy. Does not bruise/bleed easily.  Psychiatric/Behavioral: Positive for decreased concentration, depression and sleep disturbance. Negative for suicidal ideas. The patient is nervous/anxious.   Breast: Denies any new nodularity, masses, tenderness, nipple changes, or nipple discharge.       PAST MEDICAL/SURGICAL HISTORY:  Past Medical History:  Diagnosis Date  . Arthritis   . Borderline glaucoma of both eyes   . Depression   . Diabetic polyneuropathy ([place])   . Diverticulosis of colon   . Dry eyes, bilateral   . Feeling of incomplete bladder emptying   . History of breast cancer oncologist-  dr Waymon Budge-- per lov note no recurrence   dx 04/ 1999 --- Stage 3B-- s/p  chemotherapy then right mastectomy then concurrent chemoradiation therapy  . History of urinary retention    01/ 2018  . Hydronephrosis of right kidney   . Hyperlipidemia   . Hypertension   . Hypothyroidism   .  Insulin dependent type 2 diabetes mellitus [facility])    endocrinologist-  dr Cruzita Lederer---  last A1c 8.7 on 02-20-2016  . Lymphedema of upper extremity    right  . Macular degeneration, left eye    followed by dr Zigmund Daniel  . Renal insufficiency   . Urgency of urination    Past Surgical History:  Procedure Laterality Date  . CARPAL TUNNEL RELEASE Right 2017   and tendon  repair  . CATARACT EXTRACTION W/ INTRAOCULAR LENS  IMPLANT, BILATERAL  2017  . CYSTO/  RIGHT RETROGRADE PYELOGRAM/  UNROOFING RIGHT URETEROCELE  09/18/2000  . CYSTOSCOPY/RETROGRADE/URETEROSCOPY Right 05/09/2016   Procedure: CYSTOSCOPY and right RETROGRADE;  Surgeon: Irine Seal, MD;  Location: [facility];  Service: Urology;  Laterality: Right;  . FINGER SURGERY Left x2 prior to 09-09-2014  . I&D EXTREMITY Left 09/09/2014   Procedure: IRRIGATION AND DEBRIDEMENT LEFT THUMB DISTAL PHALANX;  Surgeon: Daryll Brod, MD;  Location: [place];  Service: Orthopedics;  Laterality: Left;  [name] MASTECTOMY Right 1999   w/  Node dissection's  . PORT-A-CATH REMOVAL  05/01/1999  . TUBAL LIGATION    . VAGINAL HYSTERECTOMY  1970's  . WRIST GANGLION EXCISION Right 2016     ALLERGIES:  Allergies  Allergen Reactions  . Gabapentin Other (See Comments)    Dizziness from 300 mg, tolerates 100 mg  . Penicillins Rash    Has patient had a PCN reaction causing immediate rash, facial/tongue/throat swelling, SOB or lightheadedness with hypotension: Yes Has patient had a PCN reaction causing severe rash involving mucus membranes or skin necrosis: No Has patient had a PCN reaction that required hospitalization: pt was in hospital at time of reaction Has patient had a PCN reaction occurring within the last 10 years: No If all of the above answers are "NO", then may proceed with Cephalosporin use.  . Sulfa Antibiotics Rash     CURRENT MEDICATIONS:  Outpatient Encounter Medications as of 08/25/2017  Medication Sig Note  . ALPHAGAN P 0.1 % SOLN Place 1 drop into both eyes 3 (three) times daily. 06/27/2017: LF 06/09/17 45 ds  . amLODipine (NORVASC) 10 MG tablet TAKE 1 TABLET(10 MG) BY MOUTH DAILY   . aspirin EC 81 MG tablet Take 81 mg by mouth at bedtime.   [name] buPROPion (WELLBUTRIN XL) 150 MG 24 hr tablet Take 1 tablet (150 mg total) by mouth daily.   . Cholecalciferol (VITAMIN D3) 2000 units  TABS Take 2,000 Units at bedtime by mouth.    [name] glucose 4 GM chewable tablet Chew 4 tablets (16 g total) by mouth as needed for low blood sugar.   [name] glucose blood (ONETOUCH VERIO) test strip USE TO CHECK BLOOD SUGAR 2-3 TIMES DAILY   . hydrochlorothiazide (MICROZIDE) 12.5 MG capsule TAKE 1 CAPSULE BY MOUTH EVERY DAY   . insulin degludec (TRESIBA FLEXTOUCH) 100 UNIT/ML SOPN FlexTouch Pen Inject 0.18 mLs (18 Units total) into the skin daily.   . insulin lispro (HUMALOG KWIKPEN) 100 UNIT/ML KiwkPen Inject 0.05-0.07 mLs (5-7 Units total) into the skin 3 (three) times daily. 06/27/2017: No record of pt having filled rx at her pharmacy   . Insulin Pen Needle (BD PEN NEEDLE NANO U/F) 32G X 4 MM MISC USE AS DIRECTED   . ketorolac (ACULAR) 0.5 % ophthalmic solution Place 1 drop into both eyes 3 (three) times daily. 06/27/2017: LF 06/11/17, 30 ds   . levothyroxine (SYNTHROID, LEVOTHROID) 75 MCG tablet Take 1 tablet (75 mcg total) by mouth daily  before breakfast.   . liraglutide (VICTOZA) 18 MG/3ML SOPN ADMINISTER 1.8 MG UNDER THE SKIN DAILY 06/27/2017: LF 06/11/17, 30 ds   . metFORMIN (GLUCOPHAGE) 1000 MG tablet TAKE 1 TABLET(1000 MG) BY MOUTH TWICE DAILY WITH A MEAL   . Nutritional Supplements (GLUCERNA SNACK SHAKE) LIQD Take 1 Dose by mouth 3 (three) times daily as needed.   . Olopatadine HCl (PATADAY) 0.2 % SOLN Place 1 drop into both eyes daily as needed (dry eyes).    Glory Rosebush DELICA LANCETS FINE MISC USE TO CHECK BLOOD SUGAR 2-3 times DAILY   . Polyethyl Glycol-Propyl Glycol (SYSTANE) 0.4-0.3 % GEL ophthalmic gel Place 1 application daily as needed into both eyes (dry eyes/irritation).    . rosuvastatin (CRESTOR) 20 MG tablet TAKE 1 TABLET(20 MG) BY MOUTH DAILY 06/27/2017: No record of pt having filled rx at her pharmacy   . venlafaxine XR (EFFEXOR-XR) 150 MG 24 hr capsule TAKE 1 CAPSULE BY MOUTH EVERY DAY WITH BREAKFAST   . calcium citrate-vitamin D (CITRACAL+D) 315-200 MG-UNIT tablet Take 1 tablet by  mouth 2 (two) times daily.    No facility-administered encounter medications on file as of 08/25/2017.      ONCOLOGIC FAMILY HISTORY:  Family History  Problem Relation Age of Onset  . Heart disease Father   . Diabetes Father   . Stroke Sister   . Anuerysm Sister 79       brain; maternal half-sister  . Heart disease Brother   . Anuerysm Brother 27       aortic; maternal half-brother  . Breast cancer Daughter 9       negative genetic testing in 2015  . Heart attack Daughter        98-47  . Diabetes Paternal Grandmother   . Stroke Paternal Grandfather   . Anuerysm Brother        NOS type; full brother  . Fibroids Daughter        s/p hysterectomy at 90y  . Brain cancer Maternal Uncle        dx. older than 8; NOS type  . Brain cancer Cousin        maternal 1st cousin; d. early 54s; NOS type  . Deafness Paternal Uncle        prelingual    GENETIC COUNSELING/TESTING: Updated testing in 08/2015: Genetic testing was normal, and did not reveal a deleterious mutation. Additionally, no variants of uncertain significance (VUSes) were found.  Genes tested include: ATM, BARD1, BRCA1, BRCA2, BRIP1, CDH1, CHEK2, FANCC, MLH1, MSH2, MSH6, NBN, PALB2, PMS2, PTEN, RAD51C, RAD51D, TP53, and XRCC2. This panel also includes deletion/duplication analysis (without sequencing) for one gene, EPCAM.  SOCIAL HISTORY:  Social History   Socioeconomic History  . Marital status: Widowed    Spouse name: Not on file  . Number of children: Not on file  . Years of education: Not on file  . Highest education level: Not on file  Occupational History  . Not on file  Social Needs  . Financial resource strain: Not on file  . Food insecurity:    Worry: Not on file    Inability: Not on file  . Transportation needs:    Medical: Not on file    Non-medical: Not on file  Tobacco Use  . Smoking status: Never Smoker  . Smokeless tobacco: Never Used  Substance and Sexual Activity  . Alcohol use: No     Alcohol/week: 0.0 oz  . Drug use: No  . Sexual activity: Not  on file  Lifestyle  . Physical activity:    Days per week: Not on file    Minutes per session: Not on file  . Stress: Not on file  Relationships  . Social connections:    Talks on phone: Not on file    Gets together: Not on file    Attends religious service: Not on file    Active member of club or organization: Not on file    Attends meetings of clubs or organizations: Not on file    Relationship status: Not on file  . Intimate partner violence:    Fear of current or ex partner: Not on file    Emotionally abused: Not on file    Physically abused: Not on file    Forced sexual activity: Not on file  Other Topics Concern  . Not on file  Social History Narrative  . Not on file      PHYSICAL EXAMINATION:  Vital Signs: Vitals:   08/25/17 1510  BP: (!) 140/97  Pulse: 93  Resp: 18  Temp: 98.5 F (36.9 C)  SpO2: 100%   Filed Weights   08/25/17 1510  Weight: 122 lb 9.6 oz (55.6 kg)   General: Well-nourished, well-appearing female in no acute distress.  Unaccompanied today.   HEENT: Head is normocephalic.  Pupils equal and reactive to light. Conjunctivae clear without exudate.  Sclerae anicteric. Oral mucosa is pink, moist.  Oropharynx is pink without lesions or erythema.  Lymph: No cervical, supraclavicular, or infraclavicular lymphadenopathy noted on palpation.  Cardiovascular: Regular rate and rhythm.[name] Respiratory: Clear to auscultation bilaterally. Chest expansion symmetric; breathing non-labored.  Breast Exam:  -Left breast: No appreciable masses on palpation. No skin redness, thickening, or peau d'orange appearance; no nipple retraction or nipple discharge; -Right breast: Surgically absent, no nodularity or skin change noted -Axilla: No axillary adenopathy bilaterally.  GI: Abdomen soft and round; non-tender, non-distended. Bowel sounds normoactive. No hepatosplenomegaly.   GU: Deferred.  Neuro: No focal  deficits. Steady gait.  Psych: Mood and affect normal and appropriate for situation.  MSK: No focal spinal tenderness to palpation, full range of motion in bilateral upper extremities Extremities: No edema. Skin: Warm and dry.  LABORATORY DATA:  None for this visit   DIAGNOSTIC IMAGING:  Most recent mammogram:     ASSESSMENT AND PLAN:  Ms.. Nakatani is a pleasant 67 y.o. female with history of Stage IIIB right breast invasive ductal carcinoma, ER-/PR-/HER2-, diagnosed in 1999, treated with neoadjuvant chemotherapy, mastectomy, and additional chemotherapy.  She presents to the Survivorship Clinic for surveillance and routine follow-up.   1. History of breast cancer:  [name] is currently clinically and radiographically without evidence of disease or recurrence of breast cancer. She will be due for mammogram in 08/2017.  She will return in one year for continued LTS follow up.  I did offer graduation to her PCP as well. She will consider it.  I encouraged her to call me with any questions or concerns before her next visit at the cancer center, and I would be happy to see her sooner, if needed.    2. Anxiety/grief: She is going to f/u with the appropriate people, however I reached out to Lorrin Jackson today to talk to her.    3. Bone health:  Given Ms. Altidor age, history of breast cancer, she is at risk for bone demineralization.  She was given education on specific food and activities to promote bone health.  I will defer to her  PCP about  4. Cancer screening:  Due to Ms. Saidi's history and her age, she should receive screening for skin cancers, colon cancer, and gynecologic cancers. She was encouraged to follow-up with her PCP for appropriate cancer screenings.   5. Health maintenance and wellness promotion: Ms. Sculley was encouraged to consume 5-7 servings of fruits and vegetables per day. She was also encouraged to engage in moderate to vigorous exercise for 30 minutes per day most days of  the week. She was instructed to limit her alcohol consumption and continue to abstain from tobacco use.      Dispo:  -Return to cancer center in one year for LTS follow up -Mammogram in 08/2017   A total of (30) minutes of face-to-face time was spent with this patient with greater than 50% of that time in counseling and care-coordination.   Gardenia Phlegm, [place] [phone]   Note: PRIMARY CARE PROVIDER Aldine Contes, [place] [phone]

## 2017-08-25 NOTE — Patient Instructions (Signed)
Bone Health Bones protect organs, store calcium, and anchor muscles. Good health habits, such as eating nutritious foods and exercising regularly, are important for maintaining healthy bones. They can also help to prevent a condition that causes bones to lose density and become weak and brittle (osteoporosis). Why is bone mass important? Bone mass refers to the amount of bone tissue that you have. The higher your bone mass, the stronger your bones. An important step toward having healthy bones throughout life is to have strong and dense bones during childhood. A young adult who has a high bone mass is more likely to have a high bone mass later in life. Bone mass at its greatest it is called peak bone mass. A large decline in bone mass occurs in older adults. In women, it occurs about the time of menopause. During this time, it is important to practice good health habits, because if more bone is lost than what is replaced, the bones will become less healthy and more likely to break (fracture). If you find that you have a low bone mass, you may be able to prevent osteoporosis or further bone loss by changing your diet and lifestyle. How can I find out if my bone mass is low? Bone mass can be measured with an X-ray test that is called a bone mineral density (BMD) test. This test is recommended for all women who are age 65 or older. It may also be recommended for men who are age 70 or older, or for people who are more likely to develop osteoporosis due to:  Having bones that break easily.  Having a long-term disease that weakens bones, such as kidney disease or rheumatoid arthritis.  Having menopause earlier than normal.  Taking medicine that weakens bones, such as steroids, thyroid hormones, or hormone treatment for breast cancer or prostate cancer.  Smoking.  Drinking three or more alcoholic drinks each day.  What are the nutritional recommendations for healthy bones? To have healthy bones, you  need to get enough of the right minerals and vitamins. Most nutrition experts recommend getting these nutrients from the foods that you eat. Nutritional recommendations vary from person to person. Ask your health care provider what is healthy for you. Here are some general guidelines. Calcium Recommendations Calcium is the most important (essential) mineral for bone health. Most people can get enough calcium from their diet, but supplements may be recommended for people who are at risk for osteoporosis. Good sources of calcium include:  Dairy products, such as low-fat or nonfat milk, cheese, and yogurt.  Dark green leafy vegetables, such as bok choy and broccoli.  Calcium-fortified foods, such as orange juice, cereal, bread, soy beverages, and tofu products.  Nuts, such as almonds.  Follow these recommended amounts for daily calcium intake:  Children, age 1?3: 700 mg.  Children, age 4?8: 1,000 mg.  Children, age 9?13: 1,300 mg.  Teens, age 14?18: 1,300 mg.  Adults, age 19?50: 1,000 mg.  Adults, age 51?70: ? Men: 1,000 mg. ? Women: 1,200 mg.  Adults, age 71 or older: 1,200 mg.  Pregnant and breastfeeding females: ? Teens: 1,300 mg. ? Adults: 1,000 mg.  Vitamin D Recommendations Vitamin D is the most essential vitamin for bone health. It helps the body to absorb calcium. Sunlight stimulates the skin to make vitamin D, so be sure to get enough sunlight. If you live in a cold climate or you do not get outside often, your health care provider may recommend that you take vitamin   D supplements. Good sources of vitamin D in your diet include:  Egg yolks.  Saltwater fish.  Milk and cereal fortified with vitamin D.  Follow these recommended amounts for daily vitamin D intake:  Children and teens, age 1?18: 600 international units.  Adults, age 50 or younger: 400-800 international units.  Adults, age 51 or older: 800-1,000 international units.  Other Nutrients Other nutrients  for bone health include:  Phosphorus. This mineral is found in meat, poultry, dairy foods, nuts, and legumes. The recommended daily intake for adult men and adult women is 700 mg.  Magnesium. This mineral is found in seeds, nuts, dark green vegetables, and legumes. The recommended daily intake for adult men is 400?420 mg. For adult women, it is 310?320 mg.  Vitamin K. This vitamin is found in green leafy vegetables. The recommended daily intake is 120 mg for adult men and 90 mg for adult women.  What type of physical activity is best for building and maintaining healthy bones? Weight-bearing and strength-building activities are important for building and maintaining peak bone mass. Weight-bearing activities cause muscles and bones to work against gravity. Strength-building activities increases muscle strength that supports bones. Weight-bearing and muscle-building activities include:  Walking and hiking.  Jogging and running.  Dancing.  Gym exercises.  Lifting weights.  Tennis and racquetball.  Climbing stairs.  Aerobics.  Adults should get at least 30 minutes of moderate physical activity on most days. Children should get at least 60 minutes of moderate physical activity on most days. Ask your health care provide what type of exercise is best for you. Where can I find more information? For more information, check out the following websites:  National Osteoporosis Foundation: http://nof.org/learn/basics  National Institutes of Health: http://www.niams.nih.gov/Health_Info/Bone/Bone_Health/bone_health_for_life.asp  This information is not intended to replace advice given to you by your health care provider. Make sure you discuss any questions you have with your health care provider. Document Released: 04/06/2003 Document Revised: 08/04/2015 Document Reviewed: 01/19/2014 Elsevier Interactive Patient Education  2018 Elsevier Inc.  

## 2017-08-25 NOTE — Telephone Encounter (Signed)
Gave patient avs and calendar of upcoming appts.  °

## 2017-08-27 ENCOUNTER — Encounter: Payer: Self-pay | Admitting: General Practice

## 2017-08-27 NOTE — Progress Notes (Signed)
[name] Spiritual Care Note  Received referral from [facility] Causey/NP for grief support. Reached [name] by phone at an inconvenient time, so we plan to try to talk in more detail tomorrow.    [place], [place], [facility] Pager [phone] Voicemail [phone]

## 2017-08-28 ENCOUNTER — Encounter: Payer: Self-pay | Admitting: General Practice

## 2017-08-28 NOTE — Progress Notes (Signed)
[place] Note  Spoke with [name] for 25 min, providing pastoral presence and empathic listening as she shared about the recent death of her daughter (13-Jul-2007), whose birthday was this past Sunday. [name] shared about a birthday ritual (cake, balloon release) that she and her son did together to honor her, and which they found helpful and meaningful. Encouraged ritual and remembrance as meaning-making and coping tools. Encouraged her to share frankly at her upcoming [facility] appt regarding her depression and grief, particularly because her vocal affect and presentation appeared very flat by phone. Encouraged her to take one small self-care step each day (one phone call, a few minutes outside, etc) as ways to engage in the present, even though energy can be so difficult to muster in depression and bereavement. She plans to call [facility] [place] to seek grief counseling, but has not felt ready yet. At this time her plan is to focus on seeing the specialists of psych at [facility] and grief at [facility], but she is aware of my ongoing availability as a general support person here at [facility] and plans to call as needed/desired.   [place], [place], [facility] Pager [phone] Voicemail [phone]

## 2017-09-05 ENCOUNTER — Encounter (INDEPENDENT_AMBULATORY_CARE_PROVIDER_SITE_OTHER): Payer: Medicare Other | Admitting: Ophthalmology

## 2017-09-09 ENCOUNTER — Other Ambulatory Visit: Payer: Self-pay | Admitting: Internal Medicine

## 2017-09-10 ENCOUNTER — Ambulatory Visit: Payer: Medicare Other | Admitting: Podiatry

## 2017-09-10 DIAGNOSIS — H04123 Dry eye syndrome of bilateral lacrimal glands: Secondary | ICD-10-CM | POA: Diagnosis not present

## 2017-09-10 DIAGNOSIS — H20012 Primary iridocyclitis, left eye: Secondary | ICD-10-CM | POA: Diagnosis not present

## 2017-09-10 DIAGNOSIS — H16102 Unspecified superficial keratitis, left eye: Secondary | ICD-10-CM | POA: Diagnosis not present

## 2017-09-11 ENCOUNTER — Ambulatory Visit: Payer: Self-pay | Admitting: Dietician

## 2017-09-17 DIAGNOSIS — H16102 Unspecified superficial keratitis, left eye: Secondary | ICD-10-CM | POA: Diagnosis not present

## 2017-09-17 DIAGNOSIS — H20012 Primary iridocyclitis, left eye: Secondary | ICD-10-CM | POA: Diagnosis not present

## 2017-09-22 DIAGNOSIS — H16102 Unspecified superficial keratitis, left eye: Secondary | ICD-10-CM | POA: Diagnosis not present

## 2017-09-22 DIAGNOSIS — H20012 Primary iridocyclitis, left eye: Secondary | ICD-10-CM | POA: Diagnosis not present

## 2017-09-24 ENCOUNTER — Encounter (INDEPENDENT_AMBULATORY_CARE_PROVIDER_SITE_OTHER): Payer: Medicare Other | Admitting: Ophthalmology

## 2017-09-24 DIAGNOSIS — H35033 Hypertensive retinopathy, bilateral: Secondary | ICD-10-CM

## 2017-09-24 DIAGNOSIS — E11311 Type 2 diabetes mellitus with unspecified diabetic retinopathy with macular edema: Secondary | ICD-10-CM

## 2017-09-24 DIAGNOSIS — E113591 Type 2 diabetes mellitus with proliferative diabetic retinopathy without macular edema, right eye: Secondary | ICD-10-CM

## 2017-09-24 DIAGNOSIS — E113312 Type 2 diabetes mellitus with moderate nonproliferative diabetic retinopathy with macular edema, left eye: Secondary | ICD-10-CM

## 2017-09-24 DIAGNOSIS — I10 Essential (primary) hypertension: Secondary | ICD-10-CM

## 2017-09-24 DIAGNOSIS — H43813 Vitreous degeneration, bilateral: Secondary | ICD-10-CM

## 2017-10-02 ENCOUNTER — Ambulatory Visit (HOSPITAL_COMMUNITY): Payer: Self-pay | Admitting: Psychiatry

## 2017-10-07 ENCOUNTER — Other Ambulatory Visit: Payer: Self-pay | Admitting: Internal Medicine

## 2017-10-07 ENCOUNTER — Encounter: Payer: Self-pay | Admitting: Internal Medicine

## 2017-10-07 DIAGNOSIS — E039 Hypothyroidism, unspecified: Secondary | ICD-10-CM

## 2017-10-07 MED ORDER — LEVOTHYROXINE SODIUM 75 MCG PO TABS: 75.0000 ug | ORAL_TABLET | Freq: Every day | ORAL | 0 refills | Status: DC

## 2017-10-09 ENCOUNTER — Other Ambulatory Visit: Payer: Self-pay | Admitting: Internal Medicine

## 2017-10-09 DIAGNOSIS — E039 Hypothyroidism, unspecified: Secondary | ICD-10-CM

## 2017-10-09 DIAGNOSIS — E1142 Type 2 diabetes mellitus with diabetic polyneuropathy: Secondary | ICD-10-CM

## 2017-10-09 DIAGNOSIS — I10 Essential (primary) hypertension: Secondary | ICD-10-CM

## 2017-10-09 DIAGNOSIS — Z794 Long term (current) use of insulin: Principal | ICD-10-CM

## 2017-10-09 NOTE — Telephone Encounter (Signed)
Next appt scheduled 9/17 with PCP. 

## 2017-10-10 ENCOUNTER — Telehealth: Payer: Self-pay | Admitting: Dietician

## 2017-10-13 DIAGNOSIS — H04122 Dry eye syndrome of left lacrimal gland: Secondary | ICD-10-CM | POA: Diagnosis not present

## 2017-10-13 DIAGNOSIS — H1712 Central corneal opacity, left eye: Secondary | ICD-10-CM | POA: Diagnosis not present

## 2017-10-14 ENCOUNTER — Other Ambulatory Visit: Payer: Self-pay

## 2017-10-14 ENCOUNTER — Encounter: Payer: Self-pay | Admitting: Internal Medicine

## 2017-10-14 ENCOUNTER — Encounter: Payer: Self-pay | Admitting: Dietician

## 2017-10-14 ENCOUNTER — Ambulatory Visit (INDEPENDENT_AMBULATORY_CARE_PROVIDER_SITE_OTHER): Payer: Medicare Other | Admitting: Internal Medicine

## 2017-10-14 VITALS — BP 123/62 | HR 86 | Temp 98.0°F | Ht 62.0 in | Wt 119.8 lb

## 2017-10-14 DIAGNOSIS — Z794 Long term (current) use of insulin: Secondary | ICD-10-CM | POA: Diagnosis not present

## 2017-10-14 DIAGNOSIS — N183 Chronic kidney disease, stage 3 unspecified: Secondary | ICD-10-CM

## 2017-10-14 DIAGNOSIS — F329 Major depressive disorder, single episode, unspecified: Secondary | ICD-10-CM

## 2017-10-14 DIAGNOSIS — I129 Hypertensive chronic kidney disease with stage 1 through stage 4 chronic kidney disease, or unspecified chronic kidney disease: Secondary | ICD-10-CM

## 2017-10-14 DIAGNOSIS — Z79890 Hormone replacement therapy (postmenopausal): Secondary | ICD-10-CM

## 2017-10-14 DIAGNOSIS — E785 Hyperlipidemia, unspecified: Secondary | ICD-10-CM | POA: Diagnosis not present

## 2017-10-14 DIAGNOSIS — I10 Essential (primary) hypertension: Secondary | ICD-10-CM

## 2017-10-14 DIAGNOSIS — E039 Hypothyroidism, unspecified: Secondary | ICD-10-CM | POA: Diagnosis not present

## 2017-10-14 DIAGNOSIS — F32A Depression, unspecified: Secondary | ICD-10-CM

## 2017-10-14 DIAGNOSIS — Z23 Encounter for immunization: Secondary | ICD-10-CM

## 2017-10-14 DIAGNOSIS — F339 Major depressive disorder, recurrent, unspecified: Secondary | ICD-10-CM

## 2017-10-14 DIAGNOSIS — Z79899 Other long term (current) drug therapy: Secondary | ICD-10-CM

## 2017-10-14 DIAGNOSIS — E1142 Type 2 diabetes mellitus with diabetic polyneuropathy: Secondary | ICD-10-CM | POA: Diagnosis not present

## 2017-10-14 DIAGNOSIS — E1122 Type 2 diabetes mellitus with diabetic chronic kidney disease: Secondary | ICD-10-CM

## 2017-10-14 DIAGNOSIS — R42 Dizziness and giddiness: Secondary | ICD-10-CM

## 2017-10-14 DIAGNOSIS — E118 Type 2 diabetes mellitus with unspecified complications: Secondary | ICD-10-CM

## 2017-10-14 DIAGNOSIS — Z0000 Encounter for general adult medical examination without abnormal findings: Secondary | ICD-10-CM

## 2017-10-14 LAB — POCT GLYCOSYLATED HEMOGLOBIN (HGB A1C): HbA1c POC (<> result, manual entry): >14 % — AB (ref 4.0–5.6)

## 2017-10-14 LAB — GLUCOSE, CAPILLARY: Glucose-Capillary: 350 mg/dL — ABNORMAL HIGH (ref 70–99)

## 2017-10-14 MED ORDER — ONETOUCH DELICA LANCETS FINE MISC: 7 refills | Status: DC

## 2017-10-14 MED ORDER — GLUCOSE BLOOD VI STRP: ORAL_STRIP | 12 refills | Status: DC

## 2017-10-14 MED ORDER — FREESTYLE LIBRE 14 DAY READER DEVI: 1.0000 | Freq: Four times a day (QID) | 0 refills | Status: DC

## 2017-10-14 MED ORDER — FREESTYLE LIBRE 14 DAY SENSOR MISC: 1.0000 | Freq: Four times a day (QID) | 12 refills | Status: DC

## 2017-10-14 NOTE — Telephone Encounter (Signed)
Calling to follow up on her diabetes self care and to invite Ms Welge to Diabetes group this week. Her voicemail mailbox is full. Will mail letter and invitation. [name] Plyler, RD 10/14/2017 10:25 AM.

## 2017-10-14 NOTE — Assessment & Plan Note (Signed)
-  This problem is chronic and stable -Patient states that she is compliant with her Crestor 20 mg but I suspect that she takes this only intermittently -We will check a lipid panel today

## 2017-10-14 NOTE — Assessment & Plan Note (Signed)
-  Flu shot given today -We will give patient her pneumonia shot next week

## 2017-10-14 NOTE — Assessment & Plan Note (Signed)
-  Patient was noted to have a low TSH at her visit in June and had her levothyroxine decreased to 75 mcg -We will repeat a TSH today and adjust her levothyroxine dose accordingly -Patient denies any symptoms of hypo-or hyperthyroidism currently

## 2017-10-14 NOTE — Assessment & Plan Note (Signed)
Lab Results  Component Value Date   HGBA1C >14.0 (A) 10/14/2017   HGBA1C 14.0 (A) 07/08/2017   HGBA1C 12.8 (H) 06/28/2017     Assessment: Diabetes control:  Uncontrolled Progress toward A1C goal:   Deteriorated Comments: Patient states that she is compliant with her Victoza, Humalog pre-meal, metformin 1000 mg twice daily as well as Tresiba 18 units daily.  However, she does admit to having days where she does not take her medications as prescribed  Plan: Medications:  continue current medications Home glucose monitoring: Frequency:   Timing:   Instruction/counseling given: reminded to bring blood glucose meter & log to each visit and discussed diet Educational resources provided:   Self management tools provided:   Other plans: We will check BMP today.  Will check lipid panel.  Foot exam done today.  Patient will need to follow-up with her endocrinologist (Dr. Renne Crigler).  Referral placed again for endocrinology follow-up

## 2017-10-14 NOTE — Assessment & Plan Note (Signed)
-  This problem is chronic and stable -Patient denies any urinary complaints at this time -We will check a BMP today

## 2017-10-14 NOTE — Patient Instructions (Signed)
-  It was a pleasure seeing you today -Your A1c is greater than 14.  We will need to get her blood sugars under better control.  I will refer you again to Dr. Renne Crigler for follow-up -Please follow-up with me in 1 week with your glucose meter readings -We will check some blood work on you today -Please call me if you have any questions or need any refills

## 2017-10-14 NOTE — Assessment & Plan Note (Signed)
-  This problem is chronic and stable -Patient states that she is compliant with her Effexor 150 mg -Patient has had many personal tragedies recently including the loss of her daughter with cancer -She was advised to follow-up with grief counseling but has not done so as of yet but states that she will do so soon -Her PHQ 9 score today was 16 up from 9 on her last visit -We will continue to monitor her closely

## 2017-10-14 NOTE — Progress Notes (Signed)
   Subjective:    Patient ID: [name], female    DOB: 06-04-1950, 67 y.o.   MRN: [id number]  HPI  I have seen and examined this patient.  Patient is here for follow-up of her diabetes and hypertension.  Patient has been unable to check her blood sugars at home as she is unable to figure out how her new meter works.  She also states that she has not been eating consistently and that she is only intermittently compliant with her medications given a lot of personal stressors in her life.  She also has not followed up with her endocrinologist recently secondary to multiple personal tragedies including the loss of her daughter.  Patient denies any complaints currently but did state that she felt lightheaded a little this morning.  She denies any other complaints at this time.   Review of Systems  Constitutional: Negative.   HENT: Negative.   Respiratory: Negative.   Cardiovascular: Negative.   Gastrointestinal: Negative.   Musculoskeletal: Negative.   Neurological: Positive for light-headedness. Negative for dizziness and seizures.  Psychiatric/Behavioral: Negative.        Objective:   Physical Exam  Constitutional: She appears well-developed and well-nourished.  HENT:  Head: Normocephalic.  Mouth/Throat: Oropharynx is clear and moist. No oropharyngeal exudate.  Neck: Neck supple.  Cardiovascular: Normal rate, regular rhythm and normal heart sounds.  Pulmonary/Chest: Effort normal and breath sounds normal. She has no wheezes. She has no rales.  Abdominal: Soft. Bowel sounds are normal. She exhibits no distension. There is no tenderness.  Musculoskeletal: Normal range of motion. She exhibits no edema.  Lymphadenopathy:    She has no cervical adenopathy.  Skin: Skin is warm.  Psychiatric: She has a normal mood and affect. Her behavior is normal.          Assessment & Plan:  Please see problem based charting for assessment and plan:

## 2017-10-14 NOTE — Assessment & Plan Note (Signed)
-  Patient complained of some mild lightheadedness this morning and was found to have mild hypotension with SBP in 80s. -Orthostatics done were negative -Her repeat blood pressure showed an SBP in the 120s -Patient states that she has not eaten this morning and I suspect that she may be mildly dehydrated secondary to not eating as well as her high blood sugars -We will continue with amlodipine 10 mg, hydrochlorothiazide 12.5 mg for now -We will have patient follow-up with 1 week for follow-up -We will check BMP today

## 2017-10-15 LAB — LIPID PANEL
Chol/HDL Ratio: 3.2 ratio (ref 0.0–4.4)
Cholesterol, Total: 189 mg/dL (ref 100–199)
HDL: 59 mg/dL (ref >39)
LDL Calculated: 108 mg/dL — ABNORMAL HIGH (ref 0–99)
Triglycerides: 111 mg/dL (ref 0–149)
VLDL Cholesterol Cal: 22 mg/dL (ref 5–40)

## 2017-10-15 LAB — BMP8+ANION GAP
Anion Gap: 18 mmol/L (ref 10.0–18.0)
BUN/Creatinine Ratio: 17 (ref 12–28)
BUN: 18 mg/dL (ref 8–27)
CO2: 24 mmol/L (ref 20–29)
Calcium: 10 mg/dL (ref 8.7–10.3)
Chloride: 97 mmol/L (ref 96–106)
Creatinine, Ser: 1.04 mg/dL — ABNORMAL HIGH (ref 0.57–1.00)
GFR calc Af Amer: 64 mL/min/{1.73_m2} (ref >59)
GFR calc non Af Amer: 56 mL/min/{1.73_m2} — ABNORMAL LOW (ref >59)
Glucose: 385 mg/dL — ABNORMAL HIGH (ref 65–99)
Potassium: 4.9 mmol/L (ref 3.5–5.2)
Sodium: 139 mmol/L (ref 134–144)

## 2017-10-15 LAB — TSH: TSH: 10.19 u[IU]/mL — ABNORMAL HIGH (ref 0.450–4.500)

## 2017-10-16 ENCOUNTER — Ambulatory Visit: Payer: Self-pay | Admitting: Dietician

## 2017-10-16 ENCOUNTER — Telehealth: Payer: Self-pay | Admitting: Internal Medicine

## 2017-10-16 ENCOUNTER — Encounter: Payer: Self-pay | Admitting: Internal Medicine

## 2017-10-16 DIAGNOSIS — N183 Chronic kidney disease, stage 3 unspecified: Secondary | ICD-10-CM

## 2017-10-16 NOTE — Telephone Encounter (Signed)
I attempted to call the patient today to discuss the results of her blood work.  I called multiple times and she did not answer the phone and her voicemail is full.  Patient is noted to have an elevated creatinine of 1.04 from a baseline of 0.7- 0.8.  Patient was also noted to have an elevated TSH of 10.  I suspect that the elevated creatinine is likely secondary to some dehydration in the setting of very high blood sugars.  I also think the TSH of 10 is likely secondary to noncompliance with her Synthroid.  I have will attempt to call the patient again to discuss these findings with her and encouraged medication compliance as well as maintaining good hydration.  We will have her follow-up in a week for repeat BMP and have her follow-up with endocrinology as an outpatient.  She will need a repeat TSH in 4 to 6 weeks.  I also believe that she would benefit from discussion with Dr. Maudie Mercury as far as medication compliance and going over her medications with her.  Will ask our front office to schedule those appointments for her.  We will also send letter to the patient with these recommendations.

## 2017-10-16 NOTE — Addendum Note (Signed)
Addended by: Aldine Contes on: 10/16/2017 10:12 AM   Modules accepted: Orders

## 2017-10-21 ENCOUNTER — Other Ambulatory Visit: Payer: Self-pay

## 2017-10-21 ENCOUNTER — Encounter: Payer: Self-pay | Admitting: Internal Medicine

## 2017-10-21 ENCOUNTER — Ambulatory Visit (INDEPENDENT_AMBULATORY_CARE_PROVIDER_SITE_OTHER): Payer: Medicare Other | Admitting: Internal Medicine

## 2017-10-21 VITALS — BP 131/78 | HR 107 | Temp 98.2°F | Ht 62.0 in | Wt 118.3 lb

## 2017-10-21 DIAGNOSIS — Z0000 Encounter for general adult medical examination without abnormal findings: Secondary | ICD-10-CM

## 2017-10-21 DIAGNOSIS — Z79890 Hormone replacement therapy (postmenopausal): Secondary | ICD-10-CM

## 2017-10-21 DIAGNOSIS — Z23 Encounter for immunization: Secondary | ICD-10-CM | POA: Diagnosis not present

## 2017-10-21 DIAGNOSIS — E118 Type 2 diabetes mellitus with unspecified complications: Secondary | ICD-10-CM | POA: Diagnosis not present

## 2017-10-21 DIAGNOSIS — E1122 Type 2 diabetes mellitus with diabetic chronic kidney disease: Secondary | ICD-10-CM | POA: Diagnosis not present

## 2017-10-21 DIAGNOSIS — I10 Essential (primary) hypertension: Secondary | ICD-10-CM

## 2017-10-21 DIAGNOSIS — E039 Hypothyroidism, unspecified: Secondary | ICD-10-CM

## 2017-10-21 DIAGNOSIS — Z794 Long term (current) use of insulin: Secondary | ICD-10-CM

## 2017-10-21 DIAGNOSIS — I129 Hypertensive chronic kidney disease with stage 1 through stage 4 chronic kidney disease, or unspecified chronic kidney disease: Secondary | ICD-10-CM | POA: Diagnosis not present

## 2017-10-21 DIAGNOSIS — Z79899 Other long term (current) drug therapy: Secondary | ICD-10-CM

## 2017-10-21 DIAGNOSIS — N183 Chronic kidney disease, stage 3 unspecified: Secondary | ICD-10-CM

## 2017-10-21 DIAGNOSIS — Z9114 Patient's other noncompliance with medication regimen: Secondary | ICD-10-CM

## 2017-10-21 DIAGNOSIS — E1142 Type 2 diabetes mellitus with diabetic polyneuropathy: Secondary | ICD-10-CM

## 2017-10-21 LAB — GLUCOSE, CAPILLARY: Glucose-Capillary: 276 mg/dL — ABNORMAL HIGH (ref 70–99)

## 2017-10-21 NOTE — Assessment & Plan Note (Signed)
-  Patient states that she is not taking her levothyroxine every day -Patient states this last week was elevated but likely secondary to medication noncompliance -We will recheck her TSH in 4 to 6 weeks

## 2017-10-21 NOTE — Assessment & Plan Note (Addendum)
-  Patient is close follow-up today with her meter so that we can adjust her diabetes medications accordingly -Unfortunately patient was unable to bring her meter today.  She also states that she does not know how to use her new meter yet. -We will have her follow-up with [name] so that she can learn how to use the meter -We will continue with her Victoza, Humalog pre-meal, metformin thousand grams twice daily and Tresiba 18 units daily for now -Patient's blood sugar is 276 this morning -Patient follow-up in [facility] next Wednesday with her meter and the readings so that we can adjust her medications accordingly -Patient also to follow-up with Dr. Maudie Mercury next Wednesday with all her medications given her intermittent compliance medications and her uncertainty regarding which medications to take

## 2017-10-21 NOTE — Progress Notes (Signed)
   Subjective:    Patient ID: [name], female    DOB: 12/12/1950, 67 y.o.   MRN: [id number]  HPI  I have seen and examined the patient.  Patient is here for one-week follow-up of her diabetes and hypertension.  Patient was seen in the clinic last week and is found to have an A1c greater than 14.  Patient was supposed to follow-up this week with her meter to be could adjust her diabetes medications accordingly.  However, patient forgot to bring her meter today.  She did state that she is more compliant with her medications over the last week.  She denies any complaints at this time.  Review of Systems  Constitutional: Negative.   HENT: Negative.   Respiratory: Negative.   Cardiovascular: Negative.   Gastrointestinal: Negative.   Musculoskeletal: Negative.   Neurological: Negative.   Psychiatric/Behavioral: Negative.        Objective:   Physical Exam  Constitutional: She is oriented to person, place, and time. She appears well-developed and well-nourished.  HENT:  Head: Normocephalic and atraumatic.  Mouth/Throat: Oropharynx is clear and moist. No oropharyngeal exudate.  Neck: Neck supple.  Cardiovascular: Normal rate, regular rhythm and normal heart sounds.  Pulmonary/Chest: Effort normal and breath sounds normal. No respiratory distress. She has no wheezes. She has no rales.  Abdominal: Soft. Bowel sounds are normal. She exhibits no distension. There is no tenderness.  Musculoskeletal: Normal range of motion. She exhibits no edema.  Lymphadenopathy:    She has no cervical adenopathy.  Neurological: She is alert and oriented to person, place, and time.  Psychiatric: She has a normal mood and affect. Her behavior is normal.          Assessment & Plan:  Please see problem based charting for assessment and plan:

## 2017-10-21 NOTE — Addendum Note (Signed)
Addended by: Sander Nephew F on: 10/21/2017 12:03 PM   Modules accepted: Orders

## 2017-10-21 NOTE — Assessment & Plan Note (Signed)
-  Patient blood pressure is better controlled today -Patient is now compliant with her antihypertensives. -We will continue with Norvasc 10 mg, HCTZ 12.5 mg -We will check a BMP today

## 2017-10-21 NOTE — Patient Instructions (Signed)
-  It was a pleasure seeing you today -Please follow-up with Dr. Maudie Mercury to go over your medications with you -Please follow-up with [name] for how to use your meter -Please follow-up in the clinic in 1 week with your glucose meter as well as your blood sugar readings so we can adjust your diabetic medications -Your blood pressure is well controlled.  Keep up the great work -We will give you a pneumonia shot today -We will check some blood work on you today -Please follow-up with your endocrinologist as an outpatient.  We will try and get an appointment for your -Please call me if you have any questions

## 2017-10-21 NOTE — Assessment & Plan Note (Signed)
-  23 Valent pneumonia vaccine given today 

## 2017-10-21 NOTE — Assessment & Plan Note (Addendum)
-  Patient was noted to have a mildly elevated creatinine to 1.09 at her last visit -I suspect this likely secondary to her uncontrolled diabetes and poor oral intake causing her to be dehydrated -We will recheck her BMP today  Addendum: - BMP with mildly improved creatinine - now within normal range. Will recheck again on her follow up appointment with me in November.  - Called patient and discussed results with her.

## 2017-10-22 ENCOUNTER — Other Ambulatory Visit: Payer: Self-pay | Admitting: Internal Medicine

## 2017-10-22 ENCOUNTER — Telehealth: Payer: Self-pay | Admitting: *Deleted

## 2017-10-22 ENCOUNTER — Encounter (INDEPENDENT_AMBULATORY_CARE_PROVIDER_SITE_OTHER): Payer: Medicare Other | Admitting: Ophthalmology

## 2017-10-22 DIAGNOSIS — H35033 Hypertensive retinopathy, bilateral: Secondary | ICD-10-CM

## 2017-10-22 DIAGNOSIS — E11311 Type 2 diabetes mellitus with unspecified diabetic retinopathy with macular edema: Secondary | ICD-10-CM

## 2017-10-22 DIAGNOSIS — E113511 Type 2 diabetes mellitus with proliferative diabetic retinopathy with macular edema, right eye: Secondary | ICD-10-CM

## 2017-10-22 DIAGNOSIS — Z853 Personal history of malignant neoplasm of breast: Secondary | ICD-10-CM

## 2017-10-22 DIAGNOSIS — E113312 Type 2 diabetes mellitus with moderate nonproliferative diabetic retinopathy with macular edema, left eye: Secondary | ICD-10-CM | POA: Diagnosis not present

## 2017-10-22 DIAGNOSIS — H43813 Vitreous degeneration, bilateral: Secondary | ICD-10-CM

## 2017-10-22 DIAGNOSIS — I10 Essential (primary) hypertension: Secondary | ICD-10-CM

## 2017-10-22 LAB — BMP8+ANION GAP
Anion Gap: 20 mmol/L — ABNORMAL HIGH (ref 10.0–18.0)
BUN/Creatinine Ratio: 13 (ref 12–28)
BUN: 13 mg/dL (ref 8–27)
CO2: 24 mmol/L (ref 20–29)
Calcium: 9.9 mg/dL (ref 8.7–10.3)
Chloride: 96 mmol/L (ref 96–106)
Creatinine, Ser: 0.99 mg/dL (ref 0.57–1.00)
GFR calc Af Amer: 68 mL/min/{1.73_m2} (ref >59)
GFR calc non Af Amer: 59 mL/min/{1.73_m2} — ABNORMAL LOW (ref >59)
Glucose: 302 mg/dL — ABNORMAL HIGH (ref 65–99)
Potassium: 4.6 mmol/L (ref 3.5–5.2)
Sodium: 140 mmol/L (ref 134–144)

## 2017-10-22 NOTE — Progress Notes (Signed)
When I called patient to discuss results of her blood work she informed me that she felt a lump on her left breast. She has a history of breast cancer and has had a right mastectomy. Will order a diagnostic mammo.

## 2017-10-22 NOTE — Telephone Encounter (Signed)
PATIENT CONTACTED REGARDING HER DX MAMMOGRAM  ORDERED TO BE DONE AT [facility]. PATIENT WAS GIVEN PHONE NUMBER TO CALL TO MAKE HER APPOINTMENT.

## 2017-10-28 ENCOUNTER — Other Ambulatory Visit: Payer: Self-pay | Admitting: Internal Medicine

## 2017-10-28 DIAGNOSIS — Z853 Personal history of malignant neoplasm of breast: Secondary | ICD-10-CM

## 2017-10-28 DIAGNOSIS — N63 Unspecified lump in unspecified breast: Secondary | ICD-10-CM

## 2017-10-29 ENCOUNTER — Encounter: Payer: Self-pay | Admitting: Podiatry

## 2017-10-29 ENCOUNTER — Telehealth: Payer: Self-pay | Admitting: *Deleted

## 2017-10-29 ENCOUNTER — Ambulatory Visit (INDEPENDENT_AMBULATORY_CARE_PROVIDER_SITE_OTHER): Payer: Medicare Other | Admitting: Pharmacist

## 2017-10-29 ENCOUNTER — Ambulatory Visit (INDEPENDENT_AMBULATORY_CARE_PROVIDER_SITE_OTHER): Payer: Medicare Other | Admitting: Podiatry

## 2017-10-29 DIAGNOSIS — E1149 Type 2 diabetes mellitus with other diabetic neurological complication: Secondary | ICD-10-CM

## 2017-10-29 DIAGNOSIS — L84 Corns and callosities: Secondary | ICD-10-CM | POA: Diagnosis not present

## 2017-10-29 DIAGNOSIS — M2041 Other hammer toe(s) (acquired), right foot: Secondary | ICD-10-CM

## 2017-10-29 DIAGNOSIS — Z713 Dietary counseling and surveillance: Secondary | ICD-10-CM | POA: Diagnosis not present

## 2017-10-29 DIAGNOSIS — Z794 Long term (current) use of insulin: Secondary | ICD-10-CM | POA: Diagnosis not present

## 2017-10-29 DIAGNOSIS — B351 Tinea unguium: Secondary | ICD-10-CM | POA: Diagnosis not present

## 2017-10-29 DIAGNOSIS — E1142 Type 2 diabetes mellitus with diabetic polyneuropathy: Secondary | ICD-10-CM

## 2017-10-29 DIAGNOSIS — M2042 Other hammer toe(s) (acquired), left foot: Secondary | ICD-10-CM

## 2017-10-29 DIAGNOSIS — M2010 Hallux valgus (acquired), unspecified foot: Secondary | ICD-10-CM

## 2017-10-29 NOTE — Telephone Encounter (Signed)
LVM FOR PATIENT REGARDING HER ENDOCRINOLOGY REFERRAL.  DR Letta Median Oletta Darter 22.019 - ARRIVE 2:00PM.   Kevin # [phone] / [phone].

## 2017-10-29 NOTE — Patient Instructions (Signed)
Please record the time, amount and what food drinks and activities you have while wearing the continuous glucose monitor(CGM) in the folder provided.  Bring the folder with you to follow up appointments  Do not have a CT or an MRI while wearing the CGM.   Please make an appointment for 1 week with me and a doctor for the first of two CGM downloads..   You will also return in 2 weeks to have your second download and the CGM removed.  

## 2017-10-29 NOTE — Progress Notes (Signed)
Patient ID: Kimbrely Buckel, female   DOB: 1950/02/10, 67 y.o.   MRN: [id number] Complaint:  Visit Type: Patient returns to my office for continued preventative foot care services. Complaint: Patient states" my nails have grown long and thick and become painful to walk and wear shoes" Patient has been diagnosed with DM with no foot complications. The patient presents for preventative foot care services. No changes to ROS.  Painful callus under the ball of left foot.  Podiatric Exam: Vascular: dorsalis pedis and posterior tibial pulses are palpable bilateral. Capillary return is immediate. Temperature gradient is WNL. Skin turgor WNL  Sensorium: Diminished  Semmes Weinstein monofilament test. Normal tactile sensation bilaterally. Nail Exam: Pt has thick disfigured discolored nails with subungual debris noted bilateral entire nail hallux through fifth toenails Ulcer Exam: There is no evidence of ulcer or pre-ulcerative changes or infection. Orthopedic Exam: Muscle tone and strength are WNL. No limitations in general ROM. No crepitus or effusions noted. Foot type and digits show no abnormalities. Bony prominences are unremarkable. Skin: No Porokeratosis. No infection or ulcers.  Heloma durum fifth toes B/L s/p surgery by Little Ishikawa.  symptomatic porokeratosis sub 1B/L Diagnosis:  Onychomycosis, , Pain in right toe, pain in left toes,  Heloma durum  B/L  Porokeratosis Treatment & Plan Procedures and Treatment: Consent by patient was obtained for treatment procedures. The patient understood the discussion of treatment and procedures well. All questions were answered thoroughly reviewed. Debridement of mycotic and hypertrophic toenails, 1 through 5 bilateral and clearing of subungual debris. No ulceration, no infection noted. Debride corn and callus.  Prescribe diabetic shoes for DPN, HAV and ht  B/L. Return Visit-Office Procedure: Patient instructed to return to the office for a follow up visit 3 months for  continued evaluation and treatment.  [name] DPM

## 2017-10-29 NOTE — Progress Notes (Addendum)
S: [name] is a 67 y.o. female reports to clinical pharmacist appointment for diabetes management. Patient did bring medication bottles to appointment. Patient is accompanied by family (sister-in-law).  Patient presents with most of her current home medications with her. Patient initially reported adherence with medications, however, sister-in-law mentions that the patient is not currently taking many of her home medications.   Patient also states that she has a new blood glucose meter, but she has been unable to set it up and has not re-started checking her blood glucose levels. She also expresses interest in using a continuous blood glucose monitoring device since it is difficult for her to remember checking her blood glucose.   Patient's sister-in-law expresses concerns about patient's ability to become adherent to medications. She mentions that patient would benefit from some home medication assistance.    Allergies  Allergen Reactions  . Gabapentin Other (See Comments)    Dizziness from 300 mg, tolerates 100 mg  . Penicillins Rash    Has patient had a PCN reaction causing immediate rash, facial/tongue/throat swelling, SOB or lightheadedness with hypotension: Yes Has patient had a PCN reaction causing severe rash involving mucus membranes or skin necrosis: No Has patient had a PCN reaction that required hospitalization: pt was in hospital at time of reaction Has patient had a PCN reaction occurring within the last 10 years: No If all of the above answers are "NO", then may proceed with Cephalosporin use.  . Sulfa Antibiotics Rash   Prior to Admission medications   Medication Sig Start Date End Date Taking? Authorizing Provider  ALPHAGAN P 0.1 % SOLN Place 1 drop into both eyes 3 (three) times daily. 06/09/17  Yes [provider]  amLODipine (NORVASC) 10 MG tablet TAKE 1 TABLET(10 MG) BY MOUTH DAILY 10/09/17  Yes Aldine Contes, MD  aspirin EC 81 MG tablet Take 81 mg by  mouth at bedtime.   Yes [provider]  buPROPion (WELLBUTRIN XL) 150 MG 24 hr tablet Take 1 tablet (150 mg total) by mouth daily. 06/29/17  Yes Kayleen Memos, DO  calcium citrate-vitamin D (CITRACAL+D) 315-200 MG-UNIT tablet Take 1 tablet by mouth 2 (two) times daily. 06/06/16 12/27/17 Yes Aldine Contes, MD  Cholecalciferol (VITAMIN D3) 2000 units TABS Take 2,000 Units at bedtime by mouth.    Yes [provider]  glucose 4 GM chewable tablet Chew 4 tablets (16 g total) by mouth as needed for low blood sugar. 05/28/12  Yes Dominic Pea, DO  hydrochlorothiazide (MICROZIDE) 12.5 MG capsule TAKE 1 CAPSULE BY MOUTH EVERY DAY 10/09/17  Yes Aldine Contes, MD  insulin degludec (TRESIBA FLEXTOUCH) 100 UNIT/ML SOPN FlexTouch Pen Inject 0.18 mLs (18 Units total) into the skin daily. 07/29/17  Yes Aldine Contes, MD  insulin lispro (HUMALOG KWIKPEN) 100 UNIT/ML KiwkPen Inject 0.05-0.07 mLs (5-7 Units total) into the skin 3 (three) times daily. 04/29/17  Yes Philemon Kingdom, MD  levothyroxine (SYNTHROID, LEVOTHROID) 75 MCG tablet Take 1 tablet (75 mcg total) by mouth daily before breakfast. 10/07/17  Yes Aldine Contes, MD  liraglutide (VICTOZA) 18 MG/3ML SOPN ADMINISTER 1.8 MG UNDER THE SKIN DAILY 03/14/17  Yes Philemon Kingdom, MD  metFORMIN (GLUCOPHAGE) 1000 MG tablet TAKE 1 TABLET(1000 MG) BY MOUTH TWICE DAILY WITH A MEAL 10/09/17  Yes Aldine Contes, MD  Olopatadine HCl (PATADAY) 0.2 % SOLN Place 1 drop into both eyes daily as needed (dry eyes).    Yes [provider]  Polyethyl Glycol-Propyl Glycol (SYSTANE) 0.4-0.3 % GEL ophthalmic gel  Place 1 application daily as needed into both eyes (dry eyes/irritation).    Yes [provider]  pregabalin (LYRICA) 50 MG capsule Take 50 mg by mouth 3 (three) times daily.   Yes [provider]  traZODone (DESYREL) 50 MG tablet Take 50 mg by mouth at bedtime as needed for sleep.   Yes [provider]   venlafaxine XR (EFFEXOR-XR) 150 MG 24 hr capsule TAKE 1 CAPSULE BY MOUTH EVERY DAY WITH BREAKFAST 07/11/17  Yes Aldine Contes, MD  Continuous Blood Gluc Receiver (FREESTYLE LIBRE 14 DAY READER) DEVI 1 each by Does not apply route 4 (four) times daily. Patient not taking: Reported on 10/29/2017 10/14/17   Aldine Contes, MD  Continuous Blood Gluc Sensor (FREESTYLE LIBRE 14 DAY SENSOR) MISC 1 each by Does not apply route 4 (four) times daily. Patient not taking: Reported on 10/29/2017 10/14/17   Aldine Contes, MD  glucose blood (ONETOUCH VERIO) test strip CHECK BLOOD SUGAR before meals and bedtime Patient not taking: Reported on 10/29/2017 10/14/17   Aldine Contes, MD  Insulin Pen Needle (BD PEN NEEDLE NANO U/F) 32G X 4 MM MISC USE AS DIRECTED Patient not taking: Reported on 10/29/2017 09/10/17   Aldine Contes, MD  ketorolac (ACULAR) 0.5 % ophthalmic solution Place 1 drop into both eyes 3 (three) times daily. 06/11/17   [provider]  Nutritional Supplements (GLUCERNA SNACK SHAKE) LIQD Take 1 Dose by mouth 3 (three) times daily as needed. 08/06/17   Aldine Contes, MD  ONETOUCH DELICA LANCETS FINE MISC USE TO CHECK BLOOD SUGAR before meals and bedtime 10/14/17   Aldine Contes, MD  rosuvastatin (CRESTOR) 20 MG tablet TAKE 1 TABLET(20 MG) BY MOUTH DAILY Patient not taking: Reported on 10/29/2017 04/14/17   Aldine Contes, MD   Past Medical History:  Diagnosis Date  . Arthritis   . Borderline glaucoma of both eyes   . Depression   . Diabetic polyneuropathy ([place])   . Diverticulosis of colon   . Dry eyes, bilateral   . Feeling of incomplete bladder emptying   . History of breast cancer oncologist-  dr Waymon Budge-- per lov note no recurrence   dx 04/ 1999 --- Stage 3B-- s/p  chemotherapy then right mastectomy then concurrent chemoradiation therapy  . History of urinary retention    01/ 2018  . Hydronephrosis of right kidney   . Hyperlipidemia   . Hypertension   .  Hypothyroidism   . Insulin dependent type 2 diabetes mellitus [facility])    endocrinologist-  dr Cruzita Lederer---  last A1c 8.7 on 02-20-2016  . Lymphedema of upper extremity    right  . Macular degeneration, left eye    followed by dr Zigmund Daniel  . Renal insufficiency   . Urgency of urination    Social History   Socioeconomic History  . Marital status: Widowed    Spouse name: Not on file  . Number of children: Not on file  . Years of education: Not on file  . Highest education level: Not on file  Occupational History  . Not on file  Social Needs  . Financial resource strain: Not on file  . Food insecurity:    Worry: Not on file    Inability: Not on file  . Transportation needs:    Medical: Not on file    Non-medical: Not on file  Tobacco Use  . Smoking status: Never Smoker  . Smokeless tobacco: Never Used  Substance and Sexual Activity  . Alcohol use: No  Alcohol/week: 0.0 standard drinks  . Drug use: No  . Sexual activity: Not on file  Lifestyle  . Physical activity:    Days per week: Not on file    Minutes per session: Not on file  . Stress: Not on file  Relationships  . Social connections:    Talks on phone: Not on file    Gets together: Not on file    Attends religious service: Not on file    Active member of club or organization: Not on file    Attends meetings of clubs or organizations: Not on file    Relationship status: Not on file  Other Topics Concern  . Not on file  Social History Narrative  . Not on file   Family History  Problem Relation Age of Onset  . Heart disease Father   . Diabetes Father   . Stroke Sister   . Anuerysm Sister 23       brain; maternal half-sister  . Heart disease Brother   . Anuerysm Brother 20       aortic; maternal half-brother  . Breast cancer Daughter 45       negative genetic testing in 2015  . Heart attack Daughter        75-47  . Diabetes Paternal Grandmother   . Stroke Paternal Grandfather   . Anuerysm Brother         NOS type; full brother  . Fibroids Daughter        s/p hysterectomy at 53y  . Brain cancer Maternal Uncle        dx. older than 63; NOS type  . Brain cancer Cousin        maternal 1st cousin; d. early 82s; NOS type  . Deafness Paternal Uncle        prelingual    O:    Component Value Date/Time   CHOL 189 10/14/2017 1150   HDL 59 10/14/2017 1150   TRIG 111 10/14/2017 1150   AST 22 06/27/2017 1742   AST 16 08/09/2013 0934   ALT 20 06/27/2017 1742   ALT 12 08/09/2013 0934   NA 140 10/21/2017 1105   NA 143 08/09/2013 0934   K 4.6 10/21/2017 1105   K 3.4 (L) 08/09/2013 0934   CL 96 10/21/2017 1105   CO2 24 10/21/2017 1105   CO2 25 08/09/2013 0934   GLUCOSE 302 (H) 10/21/2017 1105   GLUCOSE 285 (H) 06/29/2017 0912   GLUCOSE 192 (H) 08/09/2013 0934   HGBA1C >14.0 (A) 10/14/2017 1109   BUN 13 10/21/2017 1105   BUN 27.3 (H) 08/09/2013 0934   CREATININE 0.99 10/21/2017 1105   CREATININE 1.15 (H) 05/16/2014 1036   CREATININE 1.4 (H) 08/09/2013 0934   CALCIUM 9.9 10/21/2017 1105   CALCIUM 10.3 08/09/2013 0934   GFRNONAA 59 (L) 10/21/2017 1105   GFRNONAA 51 (L) 05/16/2014 1036   GFRAA 68 10/21/2017 1105   GFRAA 59 (L) 05/16/2014 1036   WBC 7.0 06/29/2017 0912   HGB 12.0 06/29/2017 0912   HGB 11.7 12/09/2016 1449   HGB 11.9 08/09/2013 0934   HCT 35.1 (L) 06/29/2017 0912   HCT 34.5 12/09/2016 1449   HCT 35.8 08/09/2013 0934   PLT 232 06/29/2017 0912   PLT 283 12/09/2016 1449   TSH 10.190 (H) 10/14/2017 1150   Ht Readings from Last 2 Encounters:  10/21/17 5\' 2"  (1.575 m)  10/14/17 5\' 2"  (1.575 m)   Wt Readings from Last 2 Encounters:  10/21/17  118 lb 4.8 oz (53.7 kg)  10/14/17 119 lb 12.8 oz (54.3 kg)   There is no height or weight on file to calculate BMI. BP Readings from Last 3 Encounters:  10/21/17 131/78  10/14/17 123/62  08/25/17 (!) 140/97     A/P: A drug regimen assessment was performed, including review of allergies, interactions, disease-state  management, dosing and immunization history. Medications were reviewed with the patient, including name, instructions, indication, goals of therapy, potential side effects, importance of adherence, and safe use.  Findings/Recommendations:   Thoroughly discussed the importance of medication adherence, especially while on anti-hyperglyemic medications. Referred patient to a prepackaging pharmacy that will prepack all of patient's current medications. Provided patient with thorough instructions and pharmacy address.   Patient qualified for [facility] assistance. Referred patient to [facility] clinic and informed patient to expect phone call from the clinic.  Provided patient with Boost meal replacement coupons. Educated patient to choose the Boost Glucose Control formulation.   CGM sensor placed and started. Educated patient on recording daily meal intake and instructions on what to do if the sensor falls of. Explained to patient that she will need to follow back up in clinic in one week. Will follow-up on patient eligibility for long-term CGM.    Due to lack of consistent adherence with diabetic regimen, instructed patient to:   Decrease Liraglutide dose to 1.2mg  daily  Decrease Tresiba dose to 10mg  daily  Continue Humalog 4mg  with meals  Documentation for Freestyle Libre Pro Continuous glucose monitoring Freestyle Libre Pro CGM sensor placed and started. Patient was educated about wearing sensor, keeping food, activity and medication log and when to call office. She was educated about how to care for the sensor and not to have an MRI, CT or Diathermy while wearing the sensor. Follow up was arranged with the patient for 1 week.   Lot #: [id number] A Serial #: [id number] Expiration Date: 04/28/2018  Provided written instruction on medication adjustments and patient advised to follow up in 1 week or sooner if any changes in condition or questions regarding medications arise.   The patient verbalized  understanding of information provided by repeating back concepts discussed.   30 minutes spent face-to-face with the patient during the encounter. 70% of time spent on education. 30% of time was spent on explaining medication changes and providing adjustments to help increase patient medication adherence.  Gwenlyn Found, Sherian Rein D PGY1 Pharmacy Resident  Phone [phone] 10/29/2017   4:00 PM

## 2017-10-30 ENCOUNTER — Ambulatory Visit
Admission: RE | Admit: 2017-10-30 | Discharge: 2017-10-30 | Disposition: A | Discharge status: Home / Self Care | Payer: Medicare Other | Source: Ambulatory Visit | Attending: Internal Medicine | Admitting: Internal Medicine

## 2017-10-30 DIAGNOSIS — Z853 Personal history of malignant neoplasm of breast: Secondary | ICD-10-CM

## 2017-10-30 DIAGNOSIS — N6320 Unspecified lump in the left breast, unspecified quadrant: Secondary | ICD-10-CM | POA: Diagnosis not present

## 2017-10-30 DIAGNOSIS — N63 Unspecified lump in unspecified breast: Secondary | ICD-10-CM

## 2017-10-30 DIAGNOSIS — R928 Other abnormal and inconclusive findings on diagnostic imaging of breast: Secondary | ICD-10-CM | POA: Diagnosis not present

## 2017-11-05 ENCOUNTER — Encounter: Payer: Self-pay | Admitting: Pharmacist

## 2017-11-05 ENCOUNTER — Ambulatory Visit (INDEPENDENT_AMBULATORY_CARE_PROVIDER_SITE_OTHER): Payer: Medicare Other | Admitting: Pharmacist

## 2017-11-05 VITALS — BP 102/72

## 2017-11-05 DIAGNOSIS — E1142 Type 2 diabetes mellitus with diabetic polyneuropathy: Secondary | ICD-10-CM

## 2017-11-05 DIAGNOSIS — Z794 Long term (current) use of insulin: Secondary | ICD-10-CM | POA: Diagnosis not present

## 2017-11-05 DIAGNOSIS — Z9114 Patient's other noncompliance with medication regimen: Secondary | ICD-10-CM | POA: Diagnosis not present

## 2017-11-05 NOTE — Progress Notes (Signed)
S: [name] is a 67 y.o. female reports to clinical pharmacist appointment for diabetes management. Patient did not bring medication bottles.   Patient brought in pre-packaged medications and verifies compliance with her oral medications. However, reports non-compliance with her diabetic injectable regimen including Tresiba, Humalog and Victoza. Patient also reports she has not started checking her blood glucose. She knows her monitor is working and has all the supplies but states she has not started checking her levels.   Allergies  Allergen Reactions  . Gabapentin Other (See Comments)    Dizziness from 300 mg, tolerates 100 mg Dizziness from 300 mg, tolerates 100 mg   . Penicillins Rash    Has patient had a PCN reaction causing immediate rash, facial/tongue/throat swelling, SOB or lightheadedness with hypotension: Yes Has patient had a PCN reaction causing severe rash involving mucus membranes or skin necrosis: No Has patient had a PCN reaction that required hospitalization: pt was in hospital at time of reaction Has patient had a PCN reaction occurring within the last 10 years: No If all of the above answers are "NO", then may proceed with Cephalosporin use.  . Sulfa Antibiotics Rash   Prior to Admission medications   Medication Sig Start Date End Date Taking? Authorizing Provider  acetaminophen (MAPAP) 325 MG tablet Mapap (acetaminophen) 325 mg tablet   Yes [provider]  metFORMIN (GLUCOPHAGE) 1000 MG tablet TAKE 1 TABLET(1000 MG) BY MOUTH TWICE DAILY WITH A MEAL 10/09/17  Yes Narendra, Nischal, MD  ALPHAGAN P 0.1 % SOLN Place 1 drop into both eyes 3 (three) times daily. 06/09/17   [provider]  amLODipine (NORVASC) 10 MG tablet TAKE 1 TABLET(10 MG) BY MOUTH DAILY 10/09/17   Aldine Contes, MD  aspirin EC 81 MG tablet Take 81 mg by mouth at bedtime.    [provider]  Besifloxacin HCl (BESIVANCE) 0.6 % SUSP Besivance 0.6 % eye drops,suspension   INSTILL 1 DROP INTO THE LEFT EYE QID X 2 DAYS AFTER EACH MONTHLY EYE INJECTION    [provider]  buPROPion (WELLBUTRIN XL) 150 MG 24 hr tablet Take 1 tablet (150 mg total) by mouth daily. 06/29/17   Kayleen Memos, DO  calcium citrate-vitamin D (CITRACAL+D) 315-200 MG-UNIT tablet Take 1 tablet by mouth 2 (two) times daily. 06/06/16 12/27/17  Aldine Contes, MD  Cholecalciferol (VITAMIN D3) 2000 units TABS Take 2,000 Units at bedtime by mouth.     [provider]  ciprofloxacin (CILOXAN) 0.3 % ophthalmic ointment Ciloxan 0.3 % eye ointment  APPLY TO LEFT EYE Q 2 H WHILE AWAKE    [provider]  Continuous Blood Gluc Receiver (FREESTYLE LIBRE 14 DAY READER) DEVI 1 each by Does not apply route 4 (four) times daily. 10/14/17   Aldine Contes, MD  Continuous Blood Gluc Sensor (FREESTYLE LIBRE 14 DAY SENSOR) MISC 1 each by Does not apply route 4 (four) times daily. 10/14/17   Aldine Contes, MD  cycloSPORINE (RESTASIS) 0.05 % ophthalmic emulsion Restasis MultiDose 0.05 % eye drops  INT 1 GTT IN [facility] EYE BID    [provider]  erythromycin ophthalmic ointment APPLY A SMALL AMOUNT INTO LEFT EYE TID 09/09/17   [provider]  glucose 4 GM chewable tablet Chew 4 tablets (16 g total) by mouth as needed for low blood sugar. 05/28/12   Dominic Pea, DO  glucose blood (ONETOUCH VERIO) test strip CHECK BLOOD SUGAR before meals and bedtime 10/14/17   Aldine Contes, MD  glucose blood test  strip OneTouch Verio strips  USE TO CHECK BS BID    [provider]  hydrochlorothiazide (MICROZIDE) 12.5 MG capsule TAKE 1 CAPSULE BY MOUTH EVERY DAY 10/09/17   Aldine Contes, MD  ibuprofen (ADVIL,MOTRIN) 400 MG tablet ibuprofen 400 mg tablet    [provider]  Influenza vac split quadrivalent PF (FLUZONE HIGH-DOSE) 0.5 ML injection Fluzone High-Dose 2018-2019 (PF) 180 mcg/0.5 mL intramuscular syringe  ADM 0.5ML IM UTD    [provider]   insulin degludec (TRESIBA FLEXTOUCH) 100 UNIT/ML SOPN FlexTouch Pen Inject 0.18 mLs (18 Units total) into the skin daily. 07/29/17   Aldine Contes, MD  Insulin Detemir (LEVEMIR FLEXTOUCH) 100 UNIT/ML Pen Levemir FlexTouch U-100 Insulin 100 unit/mL (3 mL) subcutaneous pen  ADM 18 UNI Stuart D AT 10 PM 06/13/15   [provider]  insulin lispro (HUMALOG KWIKPEN) 100 UNIT/ML KiwkPen Inject 0.05-0.07 mLs (5-7 Units total) into the skin 3 (three) times daily. 04/29/17   Philemon Kingdom, MD  Insulin Pen Needle (BD PEN NEEDLE NANO U/F) 32G X 4 MM MISC USE AS DIRECTED 09/10/17   Aldine Contes, MD  ketorolac (ACULAR) 0.5 % ophthalmic solution Place 1 drop into both eyes 3 (three) times daily. 06/11/17   [provider]  levothyroxine (SYNTHROID, LEVOTHROID) 88 MCG tablet  10/07/17   [provider]  liraglutide (VICTOZA) 18 MG/3ML SOPN ADMINISTER 1.8 MG UNDER THE SKIN DAILY 03/14/17   Philemon Kingdom, MD  magnesium oxide (MAG-OX) 400 (241.3 Mg) MG tablet magnesium oxide 400 mg (241.3 mg magnesium) tablet  TK 1 T PO BID    [provider]  Nutritional Supplements (GLUCERNA SNACK SHAKE) LIQD Take 1 Dose by mouth 3 (three) times daily as needed. 08/06/17   Aldine Contes, MD  Olopatadine HCl (PATADAY) 0.2 % SOLN Place 1 drop into both eyes daily as needed (dry eyes).     [provider]  Jonetta Speak LANCETS FINE MISC USE TO CHECK BLOOD SUGAR before meals and bedtime 10/14/17   Aldine Contes, MD  Polyethyl Glycol-Propyl Glycol (SYSTANE) 0.4-0.3 % GEL ophthalmic gel Place 1 application daily as needed into both eyes (dry eyes/irritation).     [provider]  potassium chloride 20 MEQ/15ML (10%) SOLN potassium chloride 20 mEq/15 mL oral liquid  TK 15 MLS PO D PRN FOR MUSCLE CRAMPS    [provider]  pravastatin (PRAVACHOL) 40 MG tablet TAKE 1 TABLET BY MOUTH DAILY 05/26/15   [provider]  prednisoLONE acetate (PRED FORTE) 1 %  ophthalmic suspension  09/23/17   [provider]  pregabalin (LYRICA) 50 MG capsule Take 50 mg by mouth 3 (three) times daily.    [provider]  repaglinide (PRANDIN) 0.5 MG tablet repaglinide 0.5 mg tablet  TK 1 T PO D WITH SUPPER    [provider]  rosuvastatin (CRESTOR) 20 MG tablet TAKE 1 TABLET(20 MG) BY MOUTH DAILY 04/14/17   Aldine Contes, MD  traZODone (DESYREL) 50 MG tablet Take 50 mg by mouth at bedtime as needed for sleep.    [provider]  UNABLE TO FIND bacitracin-polymyxin B 500 unit-10,000 unit/gram eye ointment    [provider]  UNABLE TO FIND BD Ultra-Fine Nano Pen Needle 32 gauge x 5/32"  USE AS DIRECTED    [provider]  UNABLE TO FIND OneTouch Delica Lancets 30 gauge  USE TO CHECK BLOOD SUGAR BID    [provider]  valACYclovir (VALTREX) 500 MG tablet TK 1 T PO QD  09/17/17   [provider]  venlafaxine XR (EFFEXOR-XR) 150 MG 24 hr capsule TAKE 1 CAPSULE BY MOUTH EVERY DAY WITH BREAKFAST 07/11/17   Aldine Contes, MD   Past Medical History:  Diagnosis Date  . Arthritis   . Borderline glaucoma of both eyes   . Depression   . Diabetic polyneuropathy ([place])   . Diverticulosis of colon   . Dry eyes, bilateral   . Feeling of incomplete bladder emptying   . History of breast cancer oncologist-  dr Waymon Budge-- per lov note no recurrence   dx 04/ 1999 --- Stage 3B-- s/p  chemotherapy then right mastectomy then concurrent chemoradiation therapy  . History of urinary retention    01/ 2018  . Hydronephrosis of right kidney   . Hyperlipidemia   . Hypertension   . Hypothyroidism   . Insulin dependent type 2 diabetes mellitus [facility])    endocrinologist-  dr Cruzita Lederer---  last A1c 8.7 on 02-20-2016  . Lymphedema of upper extremity    right  . Macular degeneration, left eye    followed by dr Zigmund Daniel  . Renal insufficiency   . Urgency of urination    Social History   Socioeconomic History   . Marital status: Widowed    Spouse name: Not on file  . Number of children: Not on file  . Years of education: Not on file  . Highest education level: Not on file  Occupational History  . Not on file  Social Needs  . Financial resource strain: Not on file  . Food insecurity:    Worry: Not on file    Inability: Not on file  . Transportation needs:    Medical: Not on file    Non-medical: Not on file  Tobacco Use  . Smoking status: Never Smoker  . Smokeless tobacco: Never Used  Substance and Sexual Activity  . Alcohol use: No    Alcohol/week: 0.0 standard drinks  . Drug use: No  . Sexual activity: Not on file  Lifestyle  . Physical activity:    Days per week: Not on file    Minutes per session: Not on file  . Stress: Not on file  Relationships  . Social connections:    Talks on phone: Not on file    Gets together: Not on file    Attends religious service: Not on file    Active member of club or organization: Not on file    Attends meetings of clubs or organizations: Not on file    Relationship status: Not on file  Other Topics Concern  . Not on file  Social History Narrative  . Not on file   Family History  Problem Relation Age of Onset  . Heart disease Father   . Diabetes Father   . Stroke Sister   . Anuerysm Sister 42       brain; maternal half-sister  . Heart disease Brother   . Anuerysm Brother 74       aortic; maternal half-brother  . Breast cancer Daughter 61       negative genetic testing in 2015  . Heart attack Daughter        70-47  . Diabetes Paternal Grandmother   . Stroke Paternal Grandfather   . Anuerysm Brother        NOS type; full brother  . Fibroids Daughter        s/p hysterectomy at 74y  . Brain cancer Maternal Uncle  dx. older than 39; NOS type  . Brain cancer Cousin        maternal 1st cousin; d. early 62s; NOS type  . Deafness Paternal Uncle        prelingual    O:    Component Value Date/Time   CHOL 189 10/14/2017  1150   HDL 59 10/14/2017 1150   TRIG 111 10/14/2017 1150   AST 22 06/27/2017 1742   AST 16 08/09/2013 0934   ALT 20 06/27/2017 1742   ALT 12 08/09/2013 0934   NA 140 10/21/2017 1105   NA 143 08/09/2013 0934   K 4.6 10/21/2017 1105   K 3.4 (L) 08/09/2013 0934   CL 96 10/21/2017 1105   CO2 24 10/21/2017 1105   CO2 25 08/09/2013 0934   GLUCOSE 302 (H) 10/21/2017 1105   GLUCOSE 285 (H) 06/29/2017 0912   GLUCOSE 192 (H) 08/09/2013 0934   HGBA1C >14.0 (A) 10/14/2017 1109   BUN 13 10/21/2017 1105   BUN 27.3 (H) 08/09/2013 0934   CREATININE 0.99 10/21/2017 1105   CREATININE 1.15 (H) 05/16/2014 1036   CREATININE 1.4 (H) 08/09/2013 0934   CALCIUM 9.9 10/21/2017 1105   CALCIUM 10.3 08/09/2013 0934   GFRNONAA 59 (L) 10/21/2017 1105   GFRNONAA 51 (L) 05/16/2014 1036   GFRAA 68 10/21/2017 1105   GFRAA 59 (L) 05/16/2014 1036   WBC 7.0 06/29/2017 0912   HGB 12.0 06/29/2017 0912   HGB 11.7 12/09/2016 1449   HGB 11.9 08/09/2013 0934   HCT 35.1 (L) 06/29/2017 0912   HCT 34.5 12/09/2016 1449   HCT 35.8 08/09/2013 0934   PLT 232 06/29/2017 0912   PLT 283 12/09/2016 1449   TSH 10.190 (H) 10/14/2017 1150   Ht Readings from Last 2 Encounters:  10/21/17 5\' 2"  (1.575 m)  10/14/17 5\' 2"  (1.575 m)   Wt Readings from Last 2 Encounters:  10/21/17 118 lb 4.8 oz (53.7 kg)  10/14/17 119 lb 12.8 oz (54.3 kg)   There is no height or weight on file to calculate BMI. BP Readings from Last 3 Encounters:  11/05/17 102/72  10/21/17 131/78  10/14/17 123/62     A/P: Medications were reviewed with the patient, including name, instructions, indication, goals of therapy, potential side effects, importance of adherence, and safe use.  Diabetes management:  -Continue Tresiba 10 units daily  -Continue Humalog 4 units with meals -STOP Victoza (liraglutide) -Start Ozempic 0.25mg  weekly (every Sunday). Showed patient how to use pen and dial the pen up to the 0.25mg  dose. Using the teach-back method,  patient was able to demonstrate proper use of Ozempic.  -Stressed the importance of checking blood glucose levels daily. Patient communicated understanding.   Medication Adherence:  -Extensively talked to patient on the importance of taking medication and checking blood glucose levels. Explained to patient that her last A1C was >14 and that we need to work on bringing this down by starting to be adherent to her diabetic medication therapy. Patient communicated understanding.  -Used the teach-back method to ensure patient understood the directions of her diabetic therapy and understood the importance of adherence.   An after visit summary was provided and patient advised to follow up in 1 week or sooner if any changes in condition or questions regarding medications arise.   20 minutes spent face-to-face with the patient during the encounter. 75% of time spent on education. 50% of time was spent on explaining medication therapy.  Gwenlyn Found, [place] D PGY1 Pharmacy Resident  Phone [phone] 11/05/2017   1:20 PM

## 2017-11-05 NOTE — Patient Instructions (Signed)
Provided patient written instructions as provided in note.

## 2017-11-06 ENCOUNTER — Other Ambulatory Visit: Payer: Self-pay

## 2017-11-06 NOTE — Patient Outreach (Signed)
[facility]) Care Management  11/06/2017  [place] [birth year] [id number]   TELEPHONE SCREENING Referral date: 10/29/17 Referral source: primary MD Referral reason: home medication management help Insurance: [employer] Attempt #1  Telephone call to patient regarding referral. Unable to reach patient. HIPAA compliant voice message left with call back phone number.   PLAN: RNCM will attempt 2nd telephone call to patient within 4 business days. RNCM will send outreach letter.   Quinn Plowman RN,BSN, [place] Telephonic  [phone]

## 2017-11-11 ENCOUNTER — Other Ambulatory Visit: Payer: Self-pay

## 2017-11-11 NOTE — Patient Outreach (Signed)
[place] [facility]) Care Management  11/11/2017  [name] 1950/08/26 [id number]   TELEPHONE SCREENING Referral date: 10/29/17 Referral source: primary MD Referral reason: home medication management help Insurance: [employer] Attempt #2  Telephone call to patient regarding primary MD referral.  Unable to reach patient. HIPAA compliant voice message left with call back phone number.  Telephone call attempt to patients listed emergency contact.  Unable to reach. HIPAA complaint message left with call back phone number.   PLAN; RNCM will attempt 3rd telephone call in 4 business days.  Quinn Plowman RN,BSN,CCM [facility] Telephonic  [phone]

## 2017-11-12 ENCOUNTER — Ambulatory Visit (INDEPENDENT_AMBULATORY_CARE_PROVIDER_SITE_OTHER): Payer: Medicare Other | Admitting: Pharmacist

## 2017-11-12 DIAGNOSIS — I10 Essential (primary) hypertension: Secondary | ICD-10-CM

## 2017-11-12 DIAGNOSIS — F329 Major depressive disorder, single episode, unspecified: Secondary | ICD-10-CM

## 2017-11-12 DIAGNOSIS — E1142 Type 2 diabetes mellitus with diabetic polyneuropathy: Secondary | ICD-10-CM

## 2017-11-12 DIAGNOSIS — H16223 Keratoconjunctivitis sicca, not specified as Sjogren's, bilateral: Secondary | ICD-10-CM | POA: Diagnosis not present

## 2017-11-12 DIAGNOSIS — H04123 Dry eye syndrome of bilateral lacrimal glands: Secondary | ICD-10-CM | POA: Diagnosis not present

## 2017-11-12 DIAGNOSIS — H20012 Primary iridocyclitis, left eye: Secondary | ICD-10-CM | POA: Diagnosis not present

## 2017-11-12 DIAGNOSIS — F32A Depression, unspecified: Secondary | ICD-10-CM

## 2017-11-12 DIAGNOSIS — Z794 Long term (current) use of insulin: Secondary | ICD-10-CM | POA: Diagnosis not present

## 2017-11-12 DIAGNOSIS — H1712 Central corneal opacity, left eye: Secondary | ICD-10-CM | POA: Diagnosis not present

## 2017-11-12 MED ORDER — INSULIN LISPRO 100 UNIT/ML (KWIKPEN): 5.0000 [IU] | PEN_INJECTOR | Freq: Three times a day (TID) | SUBCUTANEOUS | 5 refills | Status: DC

## 2017-11-12 NOTE — Patient Instructions (Signed)
   Continue Ozempic 0.25mg  once weekly on Sunday   Increase Tresiba dose to 12 units daily. Tyler Aas will be ready at the pharmacy by tomorrow at noon.   Increase Humalog to previously discussed dose of 4 units with meals. A prescription for this was sent to your pharmacy.   Check blood glucose levels twice daily.   CGM sensor was removed at this visit.   Contact pharmacy clinic if you have any signs of low blood sugar.   Follow up with Pharmacy Clinic in one week.

## 2017-11-12 NOTE — Progress Notes (Signed)
S: [name] is a 67 y.o. female reports to clinical pharmacist appointment for diabetes medication management. Patient did not bring medication bottles. Patient is accompanied by family  (sister-in-law).   Patient has brought her prepackaged medications to the appointment but not her insulins and Ozempic (semaglutide). Patient reports compliance with her oral medications (verified by prepackages she has brought to clinic).  Patient's current diabetic regemin includes: Ozempic (semaglutide) 0.25mg  every Sunday (started 10/13), Tresiba 10 units daily, Humalog 4 units with meals. Patient reports compliance with Ozempic and reports currently taking 2 units of humolog with meals (not every day). She also reports running out of [place] and has not taken it every day since her last visit.  Patient has started checking her blood glucose levels at home but is having trouble remember to check daily.      Allergies  Allergen Reactions  . Gabapentin Other (See Comments)    Dizziness from 300 mg, tolerates 100 mg Dizziness from 300 mg, tolerates 100 mg   . Penicillins Rash    Has patient had a PCN reaction causing immediate rash, facial/tongue/throat swelling, SOB or lightheadedness with hypotension: Yes Has patient had a PCN reaction causing severe rash involving mucus membranes or skin necrosis: No Has patient had a PCN reaction that required hospitalization: pt was in hospital at time of reaction Has patient had a PCN reaction occurring within the last 10 years: No If all of the above answers are "NO", then may proceed with Cephalosporin use.  . Sulfa Antibiotics Rash   Prior to Admission medications   Medication Sig Start Date End Date Taking? Authorizing Provider  acetaminophen (MAPAP) 325 MG tablet Mapap (acetaminophen) 325 mg tablet    [provider]  ALPHAGAN P 0.1 % SOLN Place 1 drop into both eyes 3 (three) times daily. 06/09/17   [provider]  amLODipine (NORVASC)  10 MG tablet TAKE 1 TABLET(10 MG) BY MOUTH DAILY 10/09/17   Aldine Contes, MD  aspirin EC 81 MG tablet Take 81 mg by mouth at bedtime.    [provider]  Besifloxacin HCl (BESIVANCE) 0.6 % SUSP Besivance 0.6 % eye drops,suspension  INSTILL 1 DROP INTO THE LEFT EYE QID X 2 DAYS AFTER EACH MONTHLY EYE INJECTION    [provider]  buPROPion (WELLBUTRIN XL) 150 MG 24 hr tablet Take 1 tablet (150 mg total) by mouth daily. 06/29/17   Kayleen Memos, DO  calcium citrate-vitamin D (CITRACAL+D) 315-200 MG-UNIT tablet Take 1 tablet by mouth 2 (two) times daily. 06/06/16 12/27/17  Aldine Contes, MD  Cholecalciferol (VITAMIN D3) 2000 units TABS Take 2,000 Units at bedtime by mouth.     [provider]  ciprofloxacin (CILOXAN) 0.3 % ophthalmic ointment Ciloxan 0.3 % eye ointment  APPLY TO LEFT EYE Q 2 H WHILE AWAKE    [provider]  Continuous Blood Gluc Receiver (FREESTYLE LIBRE 14 DAY READER) DEVI 1 each by Does not apply route 4 (four) times daily. 10/14/17   Aldine Contes, MD  Continuous Blood Gluc Sensor (FREESTYLE LIBRE 14 DAY SENSOR) MISC 1 each by Does not apply route 4 (four) times daily. 10/14/17   Aldine Contes, MD  cycloSPORINE (RESTASIS) 0.05 % ophthalmic emulsion Restasis MultiDose 0.05 % eye drops  INT 1 GTT IN [facility] EYE BID    [provider]  erythromycin ophthalmic ointment APPLY A SMALL AMOUNT INTO LEFT EYE TID 09/09/17   [provider]  glucose 4 GM chewable tablet Chew 4 tablets (16  g total) by mouth as needed for low blood sugar. 05/28/12   Dominic Pea, DO  glucose blood (ONETOUCH VERIO) test strip CHECK BLOOD SUGAR before meals and bedtime 10/14/17   Aldine Contes, MD  glucose blood test strip OneTouch Verio strips  USE TO CHECK BS BID    [provider]  hydrochlorothiazide (MICROZIDE) 12.5 MG capsule TAKE 1 CAPSULE BY MOUTH EVERY DAY 10/09/17   Aldine Contes, MD  ibuprofen (ADVIL,MOTRIN) 400 MG tablet  ibuprofen 400 mg tablet    [provider]  Influenza vac split quadrivalent PF (FLUZONE HIGH-DOSE) 0.5 ML injection Fluzone High-Dose 2018-2019 (PF) 180 mcg/0.5 mL intramuscular syringe  ADM 0.5ML IM UTD    [provider]  insulin degludec (TRESIBA FLEXTOUCH) 100 UNIT/ML SOPN FlexTouch Pen Inject 0.18 mLs (18 Units total) into the skin daily. 07/29/17   Aldine Contes, MD  Insulin Detemir (LEVEMIR FLEXTOUCH) 100 UNIT/ML Pen Levemir FlexTouch U-100 Insulin 100 unit/mL (3 mL) subcutaneous pen  ADM 18 UNI [place] D AT 10 PM 06/13/15   [provider]  insulin lispro (HUMALOG KWIKPEN) 100 UNIT/ML KiwkPen Inject 0.05-0.07 mLs (5-7 Units total) into the skin 3 (three) times daily. 04/29/17   Philemon Kingdom, MD  Insulin Pen Needle (BD PEN NEEDLE NANO U/F) 32G X 4 MM MISC USE AS DIRECTED 09/10/17   Aldine Contes, MD  ketorolac (ACULAR) 0.5 % ophthalmic solution Place 1 drop into both eyes 3 (three) times daily. 06/11/17   [provider]  levothyroxine (SYNTHROID, LEVOTHROID) 88 MCG tablet  10/07/17   [provider]  liraglutide (VICTOZA) 18 MG/3ML SOPN ADMINISTER 1.8 MG UNDER THE SKIN DAILY 03/14/17   Philemon Kingdom, MD  magnesium oxide (MAG-OX) 400 (241.3 Mg) MG tablet magnesium oxide 400 mg (241.3 mg magnesium) tablet  TK 1 T PO BID    [provider]  metFORMIN (GLUCOPHAGE) 1000 MG tablet TAKE 1 TABLET(1000 MG) BY MOUTH TWICE DAILY WITH A MEAL 10/09/17   Aldine Contes, MD  Nutritional Supplements (GLUCERNA SNACK SHAKE) LIQD Take 1 Dose by mouth 3 (three) times daily as needed. 08/06/17   Aldine Contes, MD  Olopatadine HCl (PATADAY) 0.2 % SOLN Place 1 drop into both eyes daily as needed (dry eyes).     [provider]  Jonetta Speak LANCETS FINE MISC USE TO CHECK BLOOD SUGAR before meals and bedtime 10/14/17   Aldine Contes, MD  Polyethyl Glycol-Propyl Glycol (SYSTANE) 0.4-0.3 % GEL ophthalmic gel Place 1 application daily as  needed into both eyes (dry eyes/irritation).     [provider]  potassium chloride 20 MEQ/15ML (10%) SOLN potassium chloride 20 mEq/15 mL oral liquid  TK 15 MLS PO D PRN FOR MUSCLE CRAMPS    [provider]  pravastatin (PRAVACHOL) 40 MG tablet TAKE 1 TABLET BY MOUTH DAILY 05/26/15   [provider]  prednisoLONE acetate (PRED FORTE) 1 % ophthalmic suspension  09/23/17   [provider]  pregabalin (LYRICA) 50 MG capsule Take 50 mg by mouth 3 (three) times daily.    [provider]  repaglinide (PRANDIN) 0.5 MG tablet repaglinide 0.5 mg tablet  TK 1 T PO D WITH SUPPER    [provider]  rosuvastatin (CRESTOR) 20 MG tablet TAKE 1 TABLET(20 MG) BY MOUTH DAILY 04/14/17   Aldine Contes, MD  traZODone (DESYREL) 50 MG tablet Take 50 mg by mouth at bedtime as needed for sleep.    [provider]  UNABLE TO FIND bacitracin-polymyxin B 500 unit-10,000 unit/gram eye  ointment    [provider]  UNABLE TO FIND BD Ultra-Fine Nano Pen Needle 32 gauge x 5/32"  USE AS DIRECTED    [provider]  UNABLE TO FIND OneTouch Delica Lancets 30 gauge  USE TO CHECK BLOOD SUGAR BID    [provider]  valACYclovir (VALTREX) 500 MG tablet TK 1 T PO QD 09/17/17   [provider]  venlafaxine XR (EFFEXOR-XR) 150 MG 24 hr capsule TAKE 1 CAPSULE BY MOUTH EVERY DAY WITH BREAKFAST 07/11/17   Aldine Contes, MD   Past Medical History:  Diagnosis Date  . Arthritis   . Borderline glaucoma of both eyes   . Depression   . Diabetic polyneuropathy ([place])   . Diverticulosis of colon   . Dry eyes, bilateral   . Feeling of incomplete bladder emptying   . History of breast cancer oncologist-  dr Waymon Budge-- per lov note no recurrence   dx 04/ 1999 --- Stage 3B-- s/p  chemotherapy then right mastectomy then concurrent chemoradiation therapy  . History of urinary retention    01/ 2018  . Hydronephrosis of right kidney   .  Hyperlipidemia   . Hypertension   . Hypothyroidism   . Insulin dependent type 2 diabetes mellitus [facility])    endocrinologist-  dr Cruzita Lederer---  last A1c 8.7 on 02-20-2016  . Lymphedema of upper extremity    right  . Macular degeneration, left eye    followed by dr Zigmund Daniel  . Renal insufficiency   . Urgency of urination    Social History   Socioeconomic History  . Marital status: Widowed    Spouse name: Not on file  . Number of children: Not on file  . Years of education: Not on file  . Highest education level: Not on file  Occupational History  . Not on file  Social Needs  . Financial resource strain: Not on file  . Food insecurity:    Worry: Not on file    Inability: Not on file  . Transportation needs:    Medical: Not on file    Non-medical: Not on file  Tobacco Use  . Smoking status: Never Smoker  . Smokeless tobacco: Never Used  Substance and Sexual Activity  . Alcohol use: No    Alcohol/week: 0.0 standard drinks  . Drug use: No  . Sexual activity: Not on file  Lifestyle  . Physical activity:    Days per week: Not on file    Minutes per session: Not on file  . Stress: Not on file  Relationships  . Social connections:    Talks on phone: Not on file    Gets together: Not on file    Attends religious service: Not on file    Active member of club or organization: Not on file    Attends meetings of clubs or organizations: Not on file    Relationship status: Not on file  Other Topics Concern  . Not on file  Social History Narrative  . Not on file   Family History  Problem Relation Age of Onset  . Heart disease Father   . Diabetes Father   . Stroke Sister   . Anuerysm Sister 22       brain; maternal half-sister  . Heart disease Brother   . Anuerysm Brother 31       aortic; maternal half-brother  . Breast cancer Daughter 22       negative genetic testing in 2015  . Heart attack  Daughter        62-47  . Diabetes Paternal Grandmother   . Stroke Paternal  Grandfather   . Anuerysm Brother        NOS type; full brother  . Fibroids Daughter        s/p hysterectomy at 60y  . Brain cancer Maternal Uncle        dx. older than 79; NOS type  . Brain cancer Cousin        maternal 1st cousin; d. early 69s; NOS type  . Deafness Paternal Uncle        prelingual    O:    Component Value Date/Time   CHOL 189 10/14/2017 1150   HDL 59 10/14/2017 1150   TRIG 111 10/14/2017 1150   AST 22 06/27/2017 1742   AST 16 08/09/2013 0934   ALT 20 06/27/2017 1742   ALT 12 08/09/2013 0934   NA 140 10/21/2017 1105   NA 143 08/09/2013 0934   K 4.6 10/21/2017 1105   K 3.4 (L) 08/09/2013 0934   CL 96 10/21/2017 1105   CO2 24 10/21/2017 1105   CO2 25 08/09/2013 0934   GLUCOSE 302 (H) 10/21/2017 1105   GLUCOSE 285 (H) 06/29/2017 0912   GLUCOSE 192 (H) 08/09/2013 0934   HGBA1C >14.0 (A) 10/14/2017 1109   BUN 13 10/21/2017 1105   BUN 27.3 (H) 08/09/2013 0934   CREATININE 0.99 10/21/2017 1105   CREATININE 1.15 (H) 05/16/2014 1036   CREATININE 1.4 (H) 08/09/2013 0934   CALCIUM 9.9 10/21/2017 1105   CALCIUM 10.3 08/09/2013 0934   GFRNONAA 59 (L) 10/21/2017 1105   GFRNONAA 51 (L) 05/16/2014 1036   GFRAA 68 10/21/2017 1105   GFRAA 59 (L) 05/16/2014 1036   WBC 7.0 06/29/2017 0912   HGB 12.0 06/29/2017 0912   HGB 11.7 12/09/2016 1449   HGB 11.9 08/09/2013 0934   HCT 35.1 (L) 06/29/2017 0912   HCT 34.5 12/09/2016 1449   HCT 35.8 08/09/2013 0934   PLT 232 06/29/2017 0912   PLT 283 12/09/2016 1449   TSH 10.190 (H) 10/14/2017 1150   Ht Readings from Last 2 Encounters:  10/21/17 5\' 2"  (1.575 m)  10/14/17 5\' 2"  (1.575 m)   Wt Readings from Last 2 Encounters:  10/21/17 118 lb 4.8 oz (53.7 kg)  10/14/17 119 lb 12.8 oz (54.3 kg)   There is no height or weight on file to calculate BMI. BP Readings from Last 3 Encounters:  11/05/17 102/72  10/21/17 131/78  10/14/17 123/62   Patient's CGM reports 14 day results as follows:  -Average 14-day: 309  mg/dL -Above 180 mg/dL: 88% -Time in target range: 12% -Time below 70mg /dL: 0%  Self monitoring Blood glucose: 300s   A/P: Medications were reviewed with the patient, including name, instructions, indication, goals of therapy, potential side effects, importance of adherence, and safe use.  Findings/Recommendations:  Continue Ozempic 0.25mg  once weekly on Sunday with plans to increase dose to 0.5mg  on 11/3.   Increase Tresiba dose to 12 units daily. Tyler Aas will be ready for patient to pick up at her pharmacy tomorrow at noon.   Increase Humalog to previously discussed dose of 4 units with meals. A prescription for this was sent to patient's pharmacy.   Check blood glucose levels twice daily.   CGM sensor was removed at this visit.   I personally set up reminders and alarms on patient's phone to help her remember to take her medications and check blood glucose levels.  Patient communicated understanding.   An after visit summary was provided and patient advised to follow up in 1 week or sooner if any changes in condition or questions regarding medications arise.   The patient verbalized understanding of information provided by repeating back concepts discussed.   30 minutes spent face-to-face with the patient during the encounter. 50% of time spent on education. 50% of time was spent on setting up ways to help patient remember to take medications and increase medication adherence.   Gwenlyn Found, Sherian Rein D PGY1 Pharmacy Resident  Phone [phone] 11/12/2017   3:22 PM

## 2017-11-13 MED ORDER — HYDROCHLOROTHIAZIDE 12.5 MG PO CAPS: ORAL_CAPSULE | ORAL | 3 refills | Status: DC

## 2017-11-13 MED ORDER — ROSUVASTATIN CALCIUM 20 MG PO TABS: ORAL_TABLET | ORAL | 3 refills | Status: DC

## 2017-11-13 MED ORDER — METFORMIN HCL ER 500 MG PO TB24: 1000.0000 mg | ORAL_TABLET | Freq: Two times a day (BID) | ORAL | 11 refills | Status: DC

## 2017-11-13 MED ORDER — AMLODIPINE BESYLATE 10 MG PO TABS: ORAL_TABLET | ORAL | 3 refills | Status: DC

## 2017-11-13 MED ORDER — VENLAFAXINE HCL ER 150 MG PO CP24: ORAL_CAPSULE | ORAL | 0 refills | Status: DC

## 2017-11-13 NOTE — Progress Notes (Signed)
Prescriptions for amlodipine, hydrochlorothiazide, metformin, rosuvastatin, and venlafaxine transferred to [facility] per patient/family request.

## 2017-11-13 NOTE — Addendum Note (Signed)
Addended by: Forde Dandy on: 11/13/2017 10:46 AM   Modules accepted: Orders

## 2017-11-13 NOTE — Progress Notes (Signed)
Patient was seen in clinic with Sara Nimer, PharmD, PGY1 pharmacy resident. I agree with the assessment and plan of care documented. 

## 2017-11-14 ENCOUNTER — Other Ambulatory Visit: Payer: Self-pay

## 2017-11-14 NOTE — Patient Outreach (Signed)
[facility]) Care Management  11/14/2017  Mauria Asquith Feb 18, 1950 [id number]   TELEPHONE SCREENING Referral date:10/29/17 Referral source:primary MD Referral reason:home medication management help Insurance:[employer] Attempt #3   Telephone call to patient regarding referral. Unable to reach patient. HIPAA compliant voice message left with call back phone number.   PLAN: If no return call will proceed with case closure.   Quinn Plowman RN,BSN, [place] Telephonic  [phone]

## 2017-11-18 ENCOUNTER — Ambulatory Visit (INDEPENDENT_AMBULATORY_CARE_PROVIDER_SITE_OTHER): Payer: Medicare Other | Admitting: Internal Medicine

## 2017-11-18 ENCOUNTER — Other Ambulatory Visit: Payer: Self-pay

## 2017-11-18 ENCOUNTER — Encounter: Payer: Self-pay | Admitting: Internal Medicine

## 2017-11-18 VITALS — BP 122/70 | HR 112 | Ht 62.0 in | Wt 117.0 lb

## 2017-11-18 DIAGNOSIS — E1142 Type 2 diabetes mellitus with diabetic polyneuropathy: Secondary | ICD-10-CM

## 2017-11-18 DIAGNOSIS — F32A Depression, unspecified: Secondary | ICD-10-CM

## 2017-11-18 DIAGNOSIS — E785 Hyperlipidemia, unspecified: Secondary | ICD-10-CM | POA: Diagnosis not present

## 2017-11-18 DIAGNOSIS — F329 Major depressive disorder, single episode, unspecified: Secondary | ICD-10-CM

## 2017-11-18 DIAGNOSIS — Z794 Long term (current) use of insulin: Secondary | ICD-10-CM | POA: Diagnosis not present

## 2017-11-18 DIAGNOSIS — E039 Hypothyroidism, unspecified: Secondary | ICD-10-CM

## 2017-11-18 LAB — TSH: TSH: 9.16 u[IU]/mL — ABNORMAL HIGH (ref 0.35–4.50)

## 2017-11-18 LAB — T4, FREE: Free T4: 0.91 ng/dL (ref 0.60–1.60)

## 2017-11-18 MED ORDER — SEMAGLUTIDE(0.25 OR 0.5MG/DOS) 2 MG/1.5ML ~~LOC~~ SOPN: 0.5000 mg | PEN_INJECTOR | SUBCUTANEOUS | 5 refills | Status: DC

## 2017-11-18 MED ORDER — INSULIN LISPRO 100 UNIT/ML (KWIKPEN): 5.0000 [IU] | PEN_INJECTOR | Freq: Three times a day (TID) | SUBCUTANEOUS | 5 refills | Status: DC

## 2017-11-18 NOTE — Telephone Encounter (Signed)
Lawson Fiscal with [facility] needs to speak with a nurse about meds. Please call back.

## 2017-11-18 NOTE — Telephone Encounter (Signed)
Pt's new pharm calls and ask who prescribes pt's wellbutrin, the last script I see in [place] came from a [employer] DO at [address]. Do you want to prescribe this?

## 2017-11-18 NOTE — Progress Notes (Signed)
Patient ID: [name], female   DOB: [birth year], 67 y.o.   MRN: [id number]   HPI: [name] is a 67 y.o.-year-old female, returning for follow-up for DM2, dx in 1991-1992, insulin-dependent since 2000s, uncontrolled, with medication noncompliance;  + complications (DR, PN). Last visit 6.5 months ago.  Last hemoglobin A1c was: Lab Results  Component Value Date   HGBA1C >14.0 (A) 10/14/2017   HGBA1C 14.0 (A) 07/08/2017   HGBA1C 12.8 (H) 06/28/2017   HGBA1C 13.0 03/14/2017   HGBA1C 11.8 (H) 12/09/2016   HGBA1C >14.0 09/03/2016   HGBA1C 7.7 05/28/2016   HGBA1C 8.7 (H) 02/20/2016   HGBA1C 11.7 12/01/2015   HGBA1C 10.7 08/29/2015   HGBA1C 8.7 06/13/2015   HGBA1C 8.1 02/24/2015   HGBA1C 9.6 10/18/2014   HGBA1C 10.2 05/16/2014   HGBA1C 10.4 03/24/2014   Pt is on a regimen of (not following the recommended doses and her medications are different than at last visit): - Metformin ER 1000 mg 2x a day, with meals  - Levemir 18 units in a.m. >> Tresiba 12 U100  - Ozempic 0.25 or 0.5 mg weekly - Humalog: 4 units before meals   Pt checks her sugars 3x times a day - no log, no meter: - am: 80-174 >> 200s >> 70 x1, 213-437 >> 200-300s - 2h after b'fast: n132 >> n/c >> 302, 355 >> n/c - before lunch:151 >> n/c >> 303 >> 200-300s - 2h after lunch:  95, 189 >> 151 >> n/c - before dinner93, 186 >> 500 x1 >> 300, 507 >> n.c - 2h after dinner:163, 257 >> n/c >> 282 >> n/c - bedtime: 108, 256 >> 220 >> n/c - nighttime:   61 x1 >> 80 >> n/c Lowest sugar was 80 >> 70 x1 >> 169; she has hypoglycemia awareness in the 80s Highest sugar was 500 >> 507 >> 300s.  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  -+ Mild CKD, last BUN/creatinine:  Lab Results  Component Value Date   BUN 13 10/21/2017   BUN 18 10/14/2017   CREATININE 0.99 10/21/2017   CREATININE 1.04 (H) 10/14/2017   -+ HL; last set of lipids: Lab Results  Component Value Date   CHOL 189 10/14/2017    HDL 59 10/14/2017   LDLCALC 108 (H) 10/14/2017   TRIG 111 10/14/2017   CHOLHDL 3.2 10/14/2017  On Crestor. - last eye exam was in 10/2017: +DR.  Was getting intraocular injections.  She has history of laser surgery -+ Numbness and tingling in her feet.  She also has hypothyroidism.  Latest TSH was quite high: Lab Results  Component Value Date   TSH 10.190 (H) 10/14/2017   TSH 0.355 (L) 07/22/2017   TSH 6.763 (H) 06/27/2017   TSH 4.700 (H) 04/01/2017   TSH 4.100 12/09/2016   TSH 1.050 05/28/2016   TSH 10.970 (H) 02/27/2016   TSH 1.500 03/14/2015   TSH 2.710 03/24/2014   TSH 1.153 12/09/2013   Pt is on levothyroxine 75 mcg daily (decreased from 88), taken: - in am - fasting - at least 30 min from b'fast - no Ca, Fe, PPIs - At last visit we moved her multivitamins later in the day - not on Biotin  ROS: Constitutional: + Fatigue, + weight loss, + subjective hyperthermia, + subjective hypothermia, + nocturia Eyes: + Blurry vision, no xerophthalmia ENT: no sore throat, + see HPI Cardiovascular: no CP/no SOB/no palpitations, + leg swelling Respiratory: no cough/no SOB/no wheezing Gastrointestinal:  no N/no V/no D/no C/no acid reflux Musculoskeletal: no muscle aches/no joint aches Skin: no rashes, + hair loss Neurological: no tremors/+ numbness/+ tingling/no dizziness  I reviewed pt's medications, allergies, PMH, social hx, family hx, and changes were documented in the history of present illness. Otherwise, unchanged from my initial visit note.  Past Medical History:  Diagnosis Date  . Arthritis   . Borderline glaucoma of both eyes   . Depression   . Diabetic polyneuropathy ([place])   . Diverticulosis of colon   . Dry eyes, bilateral   . Feeling of incomplete bladder emptying   . History of breast cancer oncologist-  dr Waymon Budge-- per lov note no recurrence   dx 04/ 1999 --- Stage 3B-- s/p  chemotherapy then right mastectomy then concurrent chemoradiation therapy  .  History of urinary retention    01/ 2018  . Hydronephrosis of right kidney   . Hyperlipidemia   . Hypertension   . Hypothyroidism   . Insulin dependent type 2 diabetes mellitus [facility])    endocrinologist-  dr Cruzita Lederer---  last A1c 8.7 on 02-20-2016  . Lymphedema of upper extremity    right  . Macular degeneration, left eye    followed by dr Zigmund Daniel  . Renal insufficiency   . Urgency of urination    Past Surgical History:  Procedure Laterality Date  . CARPAL TUNNEL RELEASE Right 2017   and tendon repair  . CATARACT EXTRACTION W/ INTRAOCULAR LENS  IMPLANT, BILATERAL  2017  . CYSTO/  RIGHT RETROGRADE PYELOGRAM/  UNROOFING RIGHT URETEROCELE  09/18/2000  . CYSTOSCOPY/RETROGRADE/URETEROSCOPY Right 05/09/2016   Procedure: CYSTOSCOPY and right RETROGRADE;  Surgeon: Irine Seal, MD;  Location: [facility];  Service: Urology;  Laterality: Right;  . FINGER SURGERY Left x2 prior to 09-09-2014  . I&D EXTREMITY Left 09/09/2014   Procedure: IRRIGATION AND DEBRIDEMENT LEFT THUMB DISTAL PHALANX;  Surgeon: Daryll Brod, MD;  Location: [facility];  Service: Orthopedics;  Laterality: Left;  [name] MASTECTOMY Right 1999   w/  Node dissection's  . PORT-A-CATH REMOVAL  05/01/1999  . TUBAL LIGATION    . VAGINAL HYSTERECTOMY  1970's  . WRIST GANGLION EXCISION Right 2016   Social History   Social History  . Marital status: Widow     Spouse name: N/A  . Number of children: 4   Occupational History  . Retired   Social History Main Topics  . Smoking status: Smoker  . Smokeless tobacco: Never Used  . Alcohol use yes  . Drug use: yes   Current Outpatient Medications on File Prior to Visit  Medication Sig Dispense Refill  . acetaminophen (MAPAP) 325 MG tablet Mapap (acetaminophen) 325 mg tablet    . ALPHAGAN P 0.1 % SOLN Place 1 drop into both eyes 3 (three) times daily.  2  . amLODipine (NORVASC) 10 MG tablet TAKE 1 TABLET(10 MG) BY MOUTH DAILY 90 tablet 3  . aspirin EC 81 MG  tablet Take 81 mg by mouth at bedtime.    [name] Besifloxacin HCl (BESIVANCE) 0.6 % SUSP Besivance 0.6 % eye drops,suspension  INSTILL 1 DROP INTO THE LEFT EYE QID X 2 DAYS AFTER EACH MONTHLY EYE INJECTION    . buPROPion (WELLBUTRIN XL) 150 MG 24 hr tablet Take 1 tablet (150 mg total) by mouth daily. 30 tablet 0  . calcium citrate-vitamin D (CITRACAL+D) 315-200 MG-UNIT tablet Take 1 tablet by mouth 2 (two) times daily. 100 tablet 3  . Cholecalciferol (VITAMIN D3) 2000 units TABS  Take 2,000 Units at bedtime by mouth.     . ciprofloxacin (CILOXAN) 0.3 % ophthalmic ointment Ciloxan 0.3 % eye ointment  APPLY TO LEFT EYE Q 2 H WHILE AWAKE    . Continuous Blood Gluc Receiver (FREESTYLE LIBRE 14 DAY READER) DEVI 1 each by Does not apply route 4 (four) times daily. 1 Device 0  . Continuous Blood Gluc Sensor (FREESTYLE LIBRE 14 DAY SENSOR) MISC 1 each by Does not apply route 4 (four) times daily. 2 each 12  . cycloSPORINE (RESTASIS) 0.05 % ophthalmic emulsion Restasis MultiDose 0.05 % eye drops  INT 1 GTT IN EACH EYE BID    . erythromycin ophthalmic ointment APPLY A SMALL AMOUNT INTO LEFT EYE TID  1  . glucose 4 GM chewable tablet Chew 4 tablets (16 g total) by mouth as needed for low blood sugar. 50 tablet 12  . glucose blood (ONETOUCH VERIO) test strip CHECK BLOOD SUGAR before meals and bedtime 125 each 12  . glucose blood test strip OneTouch Verio strips  USE TO CHECK BS BID    . hydrochlorothiazide (MICROZIDE) 12.5 MG capsule TAKE 1 CAPSULE BY MOUTH EVERY DAY 90 capsule 3  . ibuprofen (ADVIL,MOTRIN) 400 MG tablet ibuprofen 400 mg tablet    . insulin degludec (TRESIBA FLEXTOUCH) 100 UNIT/ML SOPN FlexTouch Pen Inject 0.18 mLs (18 Units total) into the skin daily. 15 mL 3  . Insulin Detemir (LEVEMIR FLEXTOUCH) 100 UNIT/ML Pen Levemir FlexTouch U-100 Insulin 100 unit/mL (3 mL) subcutaneous pen  ADM 18 UNI Soldier D AT 10 PM    . insulin lispro (HUMALOG KWIKPEN) 100 UNIT/ML KiwkPen Inject 0.05-0.07 mLs (5-7 Units  total) into the skin 3 (three) times daily. 15 mL 5  . Insulin Pen Needle (BD PEN NEEDLE NANO U/F) 32G X 4 MM MISC USE AS DIRECTED 100 each 6  . ketorolac (ACULAR) 0.5 % ophthalmic solution Place 1 drop into both eyes 3 (three) times daily.  12  . levothyroxine (SYNTHROID, LEVOTHROID) 88 MCG tablet     . liraglutide (VICTOZA) 18 MG/3ML SOPN ADMINISTER 1.8 MG UNDER THE SKIN DAILY 9 mL 5  . magnesium oxide (MAG-OX) 400 (241.3 Mg) MG tablet magnesium oxide 400 mg (241.3 mg magnesium) tablet  TK 1 T PO BID    . metFORMIN (GLUCOPHAGE-XR) 500 MG 24 hr tablet Take 2 tablets (1,000 mg total) by mouth 2 (two) times daily with a meal. 120 tablet 11  . Nutritional Supplements (GLUCERNA SNACK SHAKE) LIQD Take 1 Dose by mouth 3 (three) times daily as needed. 30 Bottle 3  . Olopatadine HCl (PATADAY) 0.2 % SOLN Place 1 drop into both eyes daily as needed (dry eyes).     Glory Rosebush DELICA LANCETS FINE MISC USE TO CHECK BLOOD SUGAR before meals and bedtime 200 each 7  . Polyethyl Glycol-Propyl Glycol (SYSTANE) 0.4-0.3 % GEL ophthalmic gel Place 1 application daily as needed into both eyes (dry eyes/irritation).     . potassium chloride 20 MEQ/15ML (10%) SOLN potassium chloride 20 mEq/15 mL oral liquid  TK 15 MLS PO D PRN FOR MUSCLE CRAMPS    . prednisoLONE acetate (PRED FORTE) 1 % ophthalmic suspension     . pregabalin (LYRICA) 50 MG capsule Take 50 mg by mouth 3 (three) times daily.    . rosuvastatin (CRESTOR) 20 MG tablet TAKE 1 TABLET(20 MG) BY MOUTH DAILY 90 tablet 3  . traZODone (DESYREL) 50 MG tablet Take 50 mg by mouth at bedtime as needed for sleep.    [name]  UNABLE TO FIND bacitracin-polymyxin B 500 unit-10,000 unit/gram eye ointment    . UNABLE TO FIND BD Ultra-Fine Nano Pen Needle 32 gauge x 5/32"  USE AS DIRECTED    . UNABLE TO FIND OneTouch Delica Lancets 30 gauge  USE TO CHECK BLOOD SUGAR BID    . valACYclovir (VALTREX) 500 MG tablet TK 1 T PO QD  0  . venlafaxine XR (EFFEXOR-XR) 150 MG 24 hr capsule  TAKE 1 CAPSULE BY MOUTH EVERY DAY WITH BREAKFAST 90 capsule 0  . Influenza vac split quadrivalent PF (FLUZONE HIGH-DOSE) 0.5 ML injection Fluzone High-Dose 2018-2019 (PF) 180 mcg/0.5 mL intramuscular syringe  ADM 0.5ML IM UTD     No current facility-administered medications on file prior to visit.    Allergies  Allergen Reactions  . Gabapentin Other (See Comments)    Dizziness from 300 mg, tolerates 100 mg Dizziness from 300 mg, tolerates 100 mg   . Penicillins Rash    Has patient had a PCN reaction causing immediate rash, facial/tongue/throat swelling, SOB or lightheadedness with hypotension: Yes Has patient had a PCN reaction causing severe rash involving mucus membranes or skin necrosis: No Has patient had a PCN reaction that required hospitalization: pt was in hospital at time of reaction Has patient had a PCN reaction occurring within the last 10 years: No If all of the above answers are "NO", then may proceed with Cephalosporin use.  . Sulfa Antibiotics Rash   Family History  Problem Relation Age of Onset  . Heart disease Father   . Diabetes Father   . Stroke Sister   . Anuerysm Sister 25       brain; maternal half-sister  . Heart disease Brother   . Anuerysm Brother 43       aortic; maternal half-brother  . Breast cancer Daughter 33       negative genetic testing in 2015  . Heart attack Daughter        6-47  . Diabetes Paternal Grandmother   . Stroke Paternal Grandfather   . Anuerysm Brother        NOS type; full brother  . Fibroids Daughter        s/p hysterectomy at 43y  . Brain cancer Maternal Uncle        dx. older than 52; NOS type  . Brain cancer Cousin        maternal 1st cousin; d. early 65s; NOS type  . Deafness Paternal Uncle        prelingual   PE: BP 122/70   Pulse (!) 112   Ht 5\' 2"  (1.575 m) Comment: measured  Wt 117 lb (53.1 kg)   SpO2 98%   BMI 21.40 kg/m  Wt Readings from Last 3 Encounters:  11/18/17 117 lb (53.1 kg)  10/21/17 118 lb  4.8 oz (53.7 kg)  10/14/17 119 lb 12.8 oz (54.3 kg)   Constitutional: Normal weight, in NAD Eyes: PERRLA, EOMI, no exophthalmos ENT: moist mucous membranes, no thyromegaly, no cervical lymphadenopathy Cardiovascular: Tachycardia RR, No MRG Respiratory: CTA B Gastrointestinal: abdomen soft, NT, ND, BS+ Musculoskeletal: no deformities, strength intact in all 4 Skin: moist, warm, no rashes Neurological: no tremor with outstretched hands, DTR normal in all 4   ASSESSMENT: 1. DM2, insulin-dependent, uncontrolled, with complications - DR - PN  2. Hypothyroidism  3. HL  PLAN:  1. Patient with long-standing, uncontrolled, type 2 diabetes, on basal insulin, GLP-1 receptor agonist, metformin, and now also Humalog.  She was previously on  Prandin, but this was not working officially for her.  At last visit, she was missing Humalog doses and she was not using the higher recommended doses.  I strongly advised her to use the Humalog consistently and increase the doses.  She is not taking the recommended doses.  She still takes 4 units before meals, which is not enough for her.  At this visit, I again advised her to increase the dose. -Her Victoza was changed to [place] since last visit and Levemir to [place].  These are good changes and we can continue them, however, the patient is not quite sure what dose that she is taking.  She believes she takes 12 units of [place].  I will advised her to go to 16 and then possibly also to 20 units if sugars remain high.  I advised her to use 0.5 mg of Ozempic weekly.  I took Victoza and Levemir off her medication list. -Most recent HbA1c from last month was undetectably high -I think she is overwhelmed by the complexity of her medication list and she now gets pillbox from the pharmacy.  However, she still has injectable medicines and she is confused about the dosing of these.  We went over the instructions several times today. - I suggested to:  Patient  Instructions  Please continue: - Metformin 1000 mg 2x a day with meals  Please increase: - Tresiba 16 units daily. After few days, you can increase to 20 units if sugars are still high. - Humalog: 5-7 units before meals  - Ozempic 0.5 mg weekly  Please continue Levothyroxine 75 mcg daily.  Take the thyroid hormone every day, with water, at least 30 minutes before breakfast, separated by at least 4 hours from: - acid reflux medications - calcium - iron - multivitamins  Please stop at the lab.  Please return in 3 months with your sugar log.   - continue checking sugars at different times of the day - check 3x a day, rotating checks - advised for yearly eye exams >> she is UTD - Return to clinic in 3 mo with sugar log   2. Hypothyroidism - latest thyroid labs reviewed with pt >> high a month ago.  Apparently her levothyroxine dose has been reduced from 88 to 75 mcg but I am not sure when this was done. - she continues on LT4 75 mcg daily - pt feels good on this dose. - we discussed about taking the thyroid hormone every day, with water, >30 minutes before breakfast, separated by >4 hours from acid reflux medications, calcium, iron, multivitamins. Pt. is taking it correctly now, we moved to multivitamins later in the day at last visit. - will check thyroid tests today: TSH and fT4  3. HL - Reviewed latest lipid panel from last month: LDL slightly high Lab Results  Component Value Date   CHOL 189 10/14/2017   HDL 59 10/14/2017   LDLCALC 108 (H) 10/14/2017   TRIG 111 10/14/2017   CHOLHDL 3.2 10/14/2017  - Continues Crestor without side effects.  Component     Latest Ref Rng & Units 11/18/2017          TSH     0.35 - 4.50 uIU/mL 9.16 (H)  T4,Free(Direct)     0.60 - 1.60 ng/dL 0.91   TSH is still elevated.  I will advised her to go back to the 88 mcg daily.  We will recheck her TFTs when she comes back in 3 months.  Philemon Kingdom,  MD PhD [facility] Endocrinology

## 2017-11-18 NOTE — Patient Instructions (Addendum)
Please continue: - Metformin 1000 mg 2x a day with meals  Please increase: - Tresiba 16 units daily. After few days, you can increase to 20 units if sugars are still high. - Humalog: 5-7 units before meals  - Ozempic 0.5 mg weekly  Please continue Levothyroxine 75 mcg daily.  Take the thyroid hormone every day, with water, at least 30 minutes before breakfast, separated by at least 4 hours from: - acid reflux medications - calcium - iron - multivitamins  Please stop at the lab.  Please return in 3 months with your sugar log.

## 2017-11-19 MED ORDER — BUPROPION HCL ER (XL) 150 MG PO TB24: 150.0000 mg | ORAL_TABLET | Freq: Every day | ORAL | 0 refills | Status: DC

## 2017-11-19 MED ORDER — LEVOTHYROXINE SODIUM 88 MCG PO TABS: ORAL_TABLET | ORAL | 3 refills | Status: DC

## 2017-11-19 NOTE — Telephone Encounter (Signed)
I am ok with prescribing this. She was started on it in the hospital and that's why another doctor was listed as the prescriber.

## 2017-11-19 NOTE — Telephone Encounter (Signed)
Does she need a refill?

## 2017-11-20 ENCOUNTER — Other Ambulatory Visit: Payer: Self-pay

## 2017-11-20 ENCOUNTER — Encounter: Payer: Self-pay | Admitting: Pharmacist

## 2017-11-20 ENCOUNTER — Ambulatory Visit (INDEPENDENT_AMBULATORY_CARE_PROVIDER_SITE_OTHER): Payer: Medicare Other | Admitting: Pharmacist

## 2017-11-20 VITALS — BP 132/72

## 2017-11-20 DIAGNOSIS — Z794 Long term (current) use of insulin: Secondary | ICD-10-CM | POA: Diagnosis not present

## 2017-11-20 DIAGNOSIS — E1142 Type 2 diabetes mellitus with diabetic polyneuropathy: Secondary | ICD-10-CM | POA: Diagnosis not present

## 2017-11-20 NOTE — Patient Outreach (Signed)
[facility]) Care Management  11/20/2017  [name] [birth year] [id number]   TELEPHONE SCREENING Referral date:10/29/17 Referral source:primary MD Referral reason:home medication management help Insurance:[employer]   No response after 3 telephone calls and outreach letter attempt.  PLAN: RNCM will close patient due to being unable to reach.  RNCM will send closure notification to patient's primary MD   Quinn Plowman RN,BSN,CCM [facility] Telephonic  [phone]

## 2017-11-20 NOTE — Progress Notes (Signed)
S: [name] is a 67 y.o. female reports to clinical pharmacist appointment for diabetes managment. Patient did  bring medication bottles.   Patient reports she has been remembering to take her medications with the alarms set for her at her last visit. She also reports checking her blood glucose levels 2-3 times daily.   Patient reports current diabetic regimen includes: Metformin 1000mg  BID, Ozempic 0.25mg  every Sunday, Tresiba 12 units daily, and Humalog 5-7 units daily. Patient had an appointment with Dr. Eulis Canner this week and was instructed to increase her Tresiba dose to 16 units daily and her Ozempic to 0.5mg  weekly but has not made those changes yet.   Patient denies experiences any signs/symptoms of hyper or hypoglycemia.    Allergies  Allergen Reactions  . Gabapentin Other (See Comments)    Dizziness from 300 mg, tolerates 100 mg Dizziness from 300 mg, tolerates 100 mg   . Penicillins Rash    Has patient had a PCN reaction causing immediate rash, facial/tongue/throat swelling, SOB or lightheadedness with hypotension: Yes Has patient had a PCN reaction causing severe rash involving mucus membranes or skin necrosis: No Has patient had a PCN reaction that required hospitalization: pt was in hospital at time of reaction Has patient had a PCN reaction occurring within the last 10 years: No If all of the above answers are "NO", then may proceed with Cephalosporin use.  . Sulfa Antibiotics Rash   Prior to Admission medications   Medication Sig Start Date End Date Taking? Authorizing Provider  acetaminophen (MAPAP) 325 MG tablet Mapap (acetaminophen) 325 mg tablet   Yes [provider]  ALPHAGAN P 0.1 % SOLN Place 1 drop into both eyes 3 (three) times daily. 06/09/17  Yes [provider]  aspirin EC 81 MG tablet Take 81 mg by mouth at bedtime.   Yes [provider]  Besifloxacin HCl (BESIVANCE) 0.6 % SUSP Besivance 0.6 % eye drops,suspension  INSTILL 1 DROP  INTO THE LEFT EYE QID X 2 DAYS AFTER EACH MONTHLY EYE INJECTION   Yes [provider]  ciprofloxacin (CILOXAN) 0.3 % ophthalmic ointment Ciloxan 0.3 % eye ointment  APPLY TO LEFT EYE Q 2 H WHILE AWAKE   Yes [provider]  Continuous Blood Gluc Receiver (FREESTYLE LIBRE 14 DAY READER) DEVI 1 each by Does not apply route 4 (four) times daily. 10/14/17  Yes Aldine Contes, MD  Continuous Blood Gluc Sensor (FREESTYLE LIBRE 14 DAY SENSOR) MISC 1 each by Does not apply route 4 (four) times daily. 10/14/17  Yes Aldine Contes, MD  cycloSPORINE (RESTASIS) 0.05 % ophthalmic emulsion Restasis MultiDose 0.05 % eye drops  INT 1 GTT IN EACH EYE BID   Yes [provider]  erythromycin ophthalmic ointment APPLY A SMALL AMOUNT INTO LEFT EYE TID 09/09/17  Yes [provider]  glucose 4 GM chewable tablet Chew 4 tablets (16 g total) by mouth as needed for low blood sugar. 05/28/12  Yes Dominic Pea, DO  glucose blood (ONETOUCH VERIO) test strip CHECK BLOOD SUGAR before meals and bedtime 10/14/17  Yes Narendra, Nischal, MD  glucose blood test strip OneTouch Verio strips  USE TO CHECK BS BID   Yes [provider]  hydrochlorothiazide (MICROZIDE) 12.5 MG capsule TAKE 1 CAPSULE BY MOUTH EVERY DAY 11/13/17  Yes Narendra, Nischal, MD  insulin degludec (TRESIBA FLEXTOUCH) 100 UNIT/ML SOPN FlexTouch Pen Inject 0.18 mLs (18 Units total) into the skin daily. 07/29/17  Yes Aldine Contes, MD  insulin lispro ([employer])  100 UNIT/ML KiwkPen Inject 0.05-0.07 mLs (5-7 Units total) into the skin 3 (three) times daily. 11/18/17  Yes Philemon Kingdom, MD  Insulin Pen Needle (BD PEN NEEDLE NANO U/F) 32G X 4 MM MISC USE AS DIRECTED 09/10/17  Yes Aldine Contes, MD  ketorolac (ACULAR) 0.5 % ophthalmic solution Place 1 drop into both eyes 3 (three) times daily. 06/11/17  Yes [provider]  levothyroxine (SYNTHROID, LEVOTHROID) 88 MCG tablet Take 88 mcg daily, at least  30 minutes before breakfast. Patient taking differently: 75 mcg. Take 88 mcg daily, at least 30 minutes before breakfast. 11/19/17  Yes Philemon Kingdom, MD  metFORMIN (GLUCOPHAGE-XR) 500 MG 24 hr tablet Take 2 tablets (1,000 mg total) by mouth 2 (two) times daily with a meal. 11/13/17  Yes Aldine Contes, MD  Nutritional Supplements (GLUCERNA SNACK SHAKE) LIQD Take 1 Dose by mouth 3 (three) times daily as needed. 08/06/17  Yes Aldine Contes, MD  Olopatadine HCl (PATADAY) 0.2 % SOLN Place 1 drop into both eyes daily as needed (dry eyes).    Yes [provider]  Jonetta Speak LANCETS FINE MISC USE TO CHECK BLOOD SUGAR before meals and bedtime 10/14/17  Yes Aldine Contes, MD  Polyethyl Glycol-Propyl Glycol (SYSTANE) 0.4-0.3 % GEL ophthalmic gel Place 1 application daily as needed into both eyes (dry eyes/irritation).    Yes [provider]  prednisoLONE acetate (PRED FORTE) 1 % ophthalmic suspension  09/23/17  Yes [provider]  pregabalin (LYRICA) 50 MG capsule Take 50 mg by mouth 3 (three) times daily.   Yes [provider]  rosuvastatin (CRESTOR) 20 MG tablet TAKE 1 TABLET(20 MG) BY MOUTH DAILY 11/13/17  Yes Aldine Contes, MD  Semaglutide,0.25 or 0.5MG /DOS, (OZEMPIC, 0.25 OR 0.5 MG/DOSE,) 2 MG/1.5ML SOPN Inject 0.5 mg into the skin once a week. 11/18/17  Yes Philemon Kingdom, MD  traZODone (DESYREL) 50 MG tablet Take 50 mg by mouth at bedtime as needed for sleep.   Yes [provider]  UNABLE TO FIND BD Ultra-Fine Nano Pen Needle 32 gauge x 5/32"  USE AS DIRECTED   Yes [provider]  UNABLE TO FIND OneTouch Delica Lancets 30 gauge  USE TO CHECK BLOOD SUGAR BID   Yes [provider]  venlafaxine XR (EFFEXOR-XR) 150 MG 24 hr capsule TAKE 1 CAPSULE BY MOUTH EVERY DAY WITH BREAKFAST 11/13/17  Yes Narendra, Nischal, MD  amLODipine (NORVASC) 10 MG tablet TAKE 1 TABLET(10 MG) BY MOUTH DAILY Patient not taking: Reported on  11/20/2017 11/13/17   Aldine Contes, MD  buPROPion (WELLBUTRIN XL) 150 MG 24 hr tablet Take 1 tablet (150 mg total) by mouth daily. Patient not taking: Reported on 11/20/2017 11/19/17   Aldine Contes, MD  calcium citrate-vitamin D (CITRACAL+D) 315-200 MG-UNIT tablet Take 1 tablet by mouth 2 (two) times daily. Patient not taking: Reported on 11/20/2017 06/06/16 12/27/17  Aldine Contes, MD  Cholecalciferol (VITAMIN D3) 2000 units TABS Take 2,000 Units at bedtime by mouth.     [provider]  ibuprofen (ADVIL,MOTRIN) 400 MG tablet ibuprofen 400 mg tablet    [provider]  magnesium oxide (MAG-OX) 400 (241.3 Mg) MG tablet magnesium oxide 400 mg (241.3 mg magnesium) tablet  TK 1 T PO BID    [provider]  potassium chloride 20 MEQ/15ML (10%) SOLN potassium chloride 20 mEq/15 mL oral liquid  TK 15 MLS PO D PRN FOR MUSCLE CRAMPS    [provider]  UNABLE TO FIND bacitracin-polymyxin B 500 unit-10,000 unit/gram eye  ointment    [provider]  valACYclovir (VALTREX) 500 MG tablet TK 1 T PO QD 09/17/17   [provider]  Influenza vac split quadrivalent PF (FLUZONE HIGH-DOSE) 0.5 ML injection Fluzone High-Dose 2018-2019 (PF) 180 mcg/0.5 mL intramuscular syringe  ADM 0.5ML IM UTD  11/20/17  [provider]   Past Medical History:  Diagnosis Date  . Arthritis   . Borderline glaucoma of both eyes   . Depression   . Diabetic polyneuropathy ([place])   . Diverticulosis of colon   . Dry eyes, bilateral   . Feeling of incomplete bladder emptying   . History of breast cancer oncologist-  dr Waymon Budge-- per lov note no recurrence   dx 04/ 1999 --- Stage 3B-- s/p  chemotherapy then right mastectomy then concurrent chemoradiation therapy  . History of urinary retention    01/ 2018  . Hydronephrosis of right kidney   . Hyperlipidemia   . Hypertension   . Hypothyroidism   . Insulin dependent type 2 diabetes mellitus [facility])     endocrinologist-  dr Cruzita Lederer---  last A1c 8.7 on 02-20-2016  . Lymphedema of upper extremity    right  . Macular degeneration, left eye    followed by dr Zigmund Daniel  . Renal insufficiency   . Urgency of urination    Social History   Socioeconomic History  . Marital status: Widowed    Spouse name: Not on file  . Number of children: Not on file  . Years of education: Not on file  . Highest education level: Not on file  Occupational History  . Not on file  Social Needs  . Financial resource strain: Not on file  . Food insecurity:    Worry: Not on file    Inability: Not on file  . Transportation needs:    Medical: Not on file    Non-medical: Not on file  Tobacco Use  . Smoking status: Never Smoker  . Smokeless tobacco: Never Used  Substance and Sexual Activity  . Alcohol use: No    Alcohol/week: 0.0 standard drinks  . Drug use: No  . Sexual activity: Not on file  Lifestyle  . Physical activity:    Days per week: Not on file    Minutes per session: Not on file  . Stress: Not on file  Relationships  . Social connections:    Talks on phone: Not on file    Gets together: Not on file    Attends religious service: Not on file    Active member of club or organization: Not on file    Attends meetings of clubs or organizations: Not on file    Relationship status: Not on file  Other Topics Concern  . Not on file  Social History Narrative  . Not on file   Family History  Problem Relation Age of Onset  . Heart disease Father   . Diabetes Father   . Stroke Sister   . Anuerysm Sister 32       brain; maternal half-sister  . Heart disease Brother   . Anuerysm Brother 9       aortic; maternal half-brother  . Breast cancer Daughter 52       negative genetic testing in 2015  . Heart attack Daughter        54-47  . Diabetes Paternal Grandmother   . Stroke Paternal Grandfather   . Anuerysm Brother        NOS type; full brother  .  Fibroids Daughter        s/p hysterectomy  at 46y  . Brain cancer Maternal Uncle        dx. older than 67; NOS type  . Brain cancer Cousin        maternal 1st cousin; d. early 32s; NOS type  . Deafness Paternal Uncle        prelingual    O:    Component Value Date/Time   CHOL 189 10/14/2017 1150   HDL 59 10/14/2017 1150   TRIG 111 10/14/2017 1150   AST 22 06/27/2017 1742   AST 16 08/09/2013 0934   ALT 20 06/27/2017 1742   ALT 12 08/09/2013 0934   NA 140 10/21/2017 1105   NA 143 08/09/2013 0934   K 4.6 10/21/2017 1105   K 3.4 (L) 08/09/2013 0934   CL 96 10/21/2017 1105   CO2 24 10/21/2017 1105   CO2 25 08/09/2013 0934   GLUCOSE 302 (H) 10/21/2017 1105   GLUCOSE 285 (H) 06/29/2017 0912   GLUCOSE 192 (H) 08/09/2013 0934   HGBA1C >14.0 (A) 10/14/2017 1109   BUN 13 10/21/2017 1105   BUN 27.3 (H) 08/09/2013 0934   CREATININE 0.99 10/21/2017 1105   CREATININE 1.15 (H) 05/16/2014 1036   CREATININE 1.4 (H) 08/09/2013 0934   CALCIUM 9.9 10/21/2017 1105   CALCIUM 10.3 08/09/2013 0934   GFRNONAA 59 (L) 10/21/2017 1105   GFRNONAA 51 (L) 05/16/2014 1036   GFRAA 68 10/21/2017 1105   GFRAA 59 (L) 05/16/2014 1036   WBC 7.0 06/29/2017 0912   HGB 12.0 06/29/2017 0912   HGB 11.7 12/09/2016 1449   HGB 11.9 08/09/2013 0934   HCT 35.1 (L) 06/29/2017 0912   HCT 34.5 12/09/2016 1449   HCT 35.8 08/09/2013 0934   PLT 232 06/29/2017 0912   PLT 283 12/09/2016 1449   TSH 9.16 (H) 11/18/2017 1505   Ht Readings from Last 2 Encounters:  11/18/17 5\' 2"  (1.575 m)  10/21/17 5\' 2"  (1.575 m)   Wt Readings from Last 2 Encounters:  11/18/17 117 lb (53.1 kg)  10/21/17 118 lb 4.8 oz (53.7 kg)   There is no height or weight on file to calculate BMI. BP Readings from Last 3 Encounters:  11/18/17 122/70  11/05/17 102/72  10/21/17 131/78   Patient did not bring blood glucose monitor to appointment, but calls son who reports recent readings. BG readings    A/P: Patient is becoming more adherent to her medications and is able to recall  her diabetic medication regimen. Diabetes is still uncontrolled with most readings in the 200s-300s but patient is starting to see some blood glucose levels in the upper 100s.   Findings/Recommendations: 1. Continue Metformin 1000mg  BID 2. Increase Ozempic to 0.5mg  every week, Tresiba to 16 units daily, and Humalog to 5-7 units as Dr. Cruzita Lederer instructed in patient's last visit.  3. Continue to monitor blood sugars 2-3 times daily.  4. Educated patient on signs/symptoms of hypoglycemia and proper steps to take if she experiences low blood sugars. Patient communicated understanding.    An after visit summary was provided and patient advised to follow up in 2-3 weeks or sooner if any changes in condition or questions regarding medications arise.   The patient verbalized understanding of information provided by repeating back concepts discussed.   30 minutes spent face-to-face with the patient during the encounter. 50% of time spent on education. 50% of time was spent on ensuring understanding of current regimen.  Gwenlyn Found,  [place] [facility] PGY1 Pharmacy Resident  Phone [phone] 11/20/2017   11:58 AM

## 2017-11-20 NOTE — Patient Instructions (Signed)
1. Continue Metformin 1000mg  BID 2. Increase Ozempic to 0.5mg  every week, Tresiba to 16 units daily, and Humalog to 5-7 units as Dr. Cruzita Lederer instructed in your last visit.  3. Continue to monitor blood sugars 2-3 times daily.  4. Follow up with Pharmacy clinic in 2-3 weeks

## 2017-11-20 NOTE — Progress Notes (Signed)
Patient was seen in clinic with [name], PharmD, PGY1 pharmacy resident. I agree with the assessment and plan of care documented. 

## 2017-11-24 ENCOUNTER — Telehealth: Payer: Self-pay | Admitting: Internal Medicine

## 2017-11-24 ENCOUNTER — Telehealth: Payer: Self-pay

## 2017-11-24 NOTE — Telephone Encounter (Signed)
Error, not needed

## 2017-11-24 NOTE — Telephone Encounter (Signed)
Notified patient of message from Dr. Cruzita Lederer, patient expressed understanding and agreement. No further questions.  Patient would like the RX sent to the [facility] (in chart)

## 2017-11-24 NOTE — Telephone Encounter (Signed)
-----   Message from Philemon Kingdom, MD sent at 11/19/2017  1:21 PM EDT ----- Lenna Sciara, can you please call pt: Her TSH is still high.  We need to change her levothyroxine from 75 to 88 mcg daily.  Please ask her where this needs to be sent.  She gets pill packs and I would like the levothyroxine to be packaged along with her other pills.  I did not send the prescription to the pharmacy yet.  We will recheck her TFTs when she comes back in 3 months.

## 2017-11-27 ENCOUNTER — Telehealth: Payer: Self-pay | Admitting: Dietician

## 2017-11-27 ENCOUNTER — Other Ambulatory Visit: Payer: Self-pay | Admitting: Dietician

## 2017-11-27 DIAGNOSIS — E1142 Type 2 diabetes mellitus with diabetic polyneuropathy: Secondary | ICD-10-CM

## 2017-11-27 DIAGNOSIS — Z794 Long term (current) use of insulin: Principal | ICD-10-CM

## 2017-11-27 NOTE — Telephone Encounter (Signed)
[name] is a 67 y.o. female who was contacted on behalf of [facility] Geriatrics Task Force.  No answer, Mailbox not accepting new messages. Debera Lat, RD 11/27/2017 10:39 AM.

## 2017-11-27 NOTE — Progress Notes (Signed)
Referral request

## 2017-11-27 NOTE — Telephone Encounter (Addendum)
Ayani Ospina is a 67 y.o. female who was contacted on behalf of [facility] Geriatrics Task Force.   Diabetes Assessment  DM meds and BS checks -  "What medications are you taking for diabetes?" Tresiba 15 morning, Humalog before meals-5-7 units, ozempic 0.5 takes on sundays -  "How often do you check your blood sugars at home?" 3-4 times - "What have your blood sugars been?" 90s this am, one morning it was 70s Weight 117# last time she weighed. Wants to know about Libre-called number off the TV.  Dr. Cruzita Lederer would like her to use a cgm.   High Blood Pressure Assessment  BP meds & BP checks -  "What medications are you taking for high blood pressure?" not sure if she is taking - "How often do you check your blood pressure at home?" no- has a meter but is going to look for it. - "What have your blood pressure readings been?"   Coping with DM and BP - What else are you doing to help with your DM and BP - Diet? Not eating much, son is helping with meals.  -  Exercise? Walks in house  Medication Access Issues  Medication Issues? -  "Are you getting your medicines refilled on time without skipping any doses?" yes - "Are you having any problems getting/taking your meds (cost, timing, transportation)?" no - Do you need any meds refilled? no  Conclusion  I am concerned about her continued weight loss. She has dropped 8# (6% baseline bodyweight) in 4 months.  Request Medical Nutrition Therapy for assessment of nutrition intake and ongoing weight loss.   Debera Lat, RD 11/27/2017 10:59 AM.

## 2017-11-29 ENCOUNTER — Other Ambulatory Visit: Payer: Self-pay

## 2017-11-29 ENCOUNTER — Encounter (HOSPITAL_COMMUNITY): Payer: Self-pay | Admitting: *Deleted

## 2017-11-29 ENCOUNTER — Emergency Department (HOSPITAL_COMMUNITY): Payer: Medicare Other

## 2017-11-29 ENCOUNTER — Emergency Department (HOSPITAL_COMMUNITY)
Admission: EM | Admit: 2017-11-29 | Discharge: 2017-11-29 | Disposition: A | Discharge status: Home / Self Care | Payer: Medicare Other | Attending: Emergency Medicine | Admitting: Emergency Medicine

## 2017-11-29 DIAGNOSIS — I129 Hypertensive chronic kidney disease with stage 1 through stage 4 chronic kidney disease, or unspecified chronic kidney disease: Secondary | ICD-10-CM | POA: Insufficient documentation

## 2017-11-29 DIAGNOSIS — M25551 Pain in right hip: Secondary | ICD-10-CM | POA: Diagnosis not present

## 2017-11-29 DIAGNOSIS — Z7984 Long term (current) use of oral hypoglycemic drugs: Secondary | ICD-10-CM | POA: Diagnosis not present

## 2017-11-29 DIAGNOSIS — E1122 Type 2 diabetes mellitus with diabetic chronic kidney disease: Secondary | ICD-10-CM | POA: Insufficient documentation

## 2017-11-29 DIAGNOSIS — E039 Hypothyroidism, unspecified: Secondary | ICD-10-CM | POA: Diagnosis not present

## 2017-11-29 DIAGNOSIS — Z79899 Other long term (current) drug therapy: Secondary | ICD-10-CM | POA: Diagnosis not present

## 2017-11-29 DIAGNOSIS — R296 Repeated falls: Secondary | ICD-10-CM | POA: Diagnosis not present

## 2017-11-29 DIAGNOSIS — N183 Chronic kidney disease, stage 3 (moderate): Secondary | ICD-10-CM | POA: Insufficient documentation

## 2017-11-29 DIAGNOSIS — Z853 Personal history of malignant neoplasm of breast: Secondary | ICD-10-CM | POA: Diagnosis not present

## 2017-11-29 DIAGNOSIS — R42 Dizziness and giddiness: Secondary | ICD-10-CM

## 2017-11-29 LAB — CBC WITH DIFFERENTIAL/PLATELET
Abs Immature Granulocytes: 0.01 10*3/uL (ref 0.00–0.07)
Basophils Absolute: 0 10*3/uL (ref 0.0–0.1)
Basophils Relative: 1 %
Eosinophils Absolute: 0 10*3/uL (ref 0.0–0.5)
Eosinophils Relative: 1 %
HCT: 35.2 % — ABNORMAL LOW (ref 36.0–46.0)
Hemoglobin: 11.5 g/dL — ABNORMAL LOW (ref 12.0–15.0)
Immature Granulocytes: 0 %
Lymphocytes Relative: 28 %
Lymphs Abs: 1.4 10*3/uL (ref 0.7–4.0)
MCH: 30.7 pg (ref 26.0–34.0)
MCHC: 32.7 g/dL (ref 30.0–36.0)
MCV: 94.1 fL (ref 80.0–100.0)
Monocytes Absolute: 0.3 10*3/uL (ref 0.1–1.0)
Monocytes Relative: 6 %
Neutro Abs: 3.2 10*3/uL (ref 1.7–7.7)
Neutrophils Relative %: 64 %
Platelets: 218 10*3/uL (ref 150–400)
RBC: 3.74 MIL/uL — ABNORMAL LOW (ref 3.87–5.11)
RDW: 11.9 % (ref 11.5–15.5)
WBC: 5 10*3/uL (ref 4.0–10.5)
nRBC: 0 % (ref 0.0–0.2)

## 2017-11-29 LAB — BASIC METABOLIC PANEL
Anion gap: 9 (ref 5–15)
BUN: 15 mg/dL (ref 8–23)
CO2: 30 mmol/L (ref 22–32)
Calcium: 9 mg/dL (ref 8.9–10.3)
Chloride: 103 mmol/L (ref 98–111)
Creatinine, Ser: 0.89 mg/dL (ref 0.44–1.00)
GFR calc Af Amer: >60 mL/min (ref >60)
GFR calc non Af Amer: >60 mL/min (ref >60)
Glucose, Bld: 198 mg/dL — ABNORMAL HIGH (ref 70–99)
Potassium: 3.1 mmol/L — ABNORMAL LOW (ref 3.5–5.1)
Sodium: 142 mmol/L (ref 135–145)

## 2017-11-29 MED ORDER — SODIUM CHLORIDE 0.9 % IV BOLUS
1000.0000 mL | Freq: Once | INTRAVENOUS | Status: AC
  Administered 2017-11-29: 1000 mL via INTRAVENOUS

## 2017-11-29 MED ORDER — MECLIZINE HCL 12.5 MG PO TABS: 12.5000 mg | ORAL_TABLET | Freq: Three times a day (TID) | ORAL | 0 refills | Status: DC | PRN

## 2017-11-29 MED ORDER — LORAZEPAM 2 MG/ML IJ SOLN
1.0000 mg | Freq: Once | INTRAMUSCULAR | Status: AC
  Administered 2017-11-29: 1 mg via INTRAVENOUS
  Filled 2017-11-29: qty 1

## 2017-11-29 MED ORDER — MECLIZINE HCL 25 MG PO TABS
12.5000 mg | ORAL_TABLET | Freq: Once | ORAL | Status: AC
  Administered 2017-11-29: 12.5 mg via ORAL
  Filled 2017-11-29: qty 1

## 2017-11-29 NOTE — ED Notes (Signed)
Bed: WA04 Expected date:  Expected time:  Means of arrival:  Comments: 67 yo dizziness

## 2017-11-29 NOTE — ED Notes (Signed)
Pt was able to ambulate with walker

## 2017-11-29 NOTE — ED Provider Notes (Signed)
[facility] DEPT Provider Note   CSN: [id number] Arrival date & time: 11/29/17  1252     History   Chief Complaint Chief Complaint  Patient presents with  . Dizziness    HPI [name] is a 67 y.o. female with history of glaucoma, diabetic polyneuropathy, diverticulosis, HLD, HTN, hypothyroidism, renal insufficiency presents for evaluation of acute onset, progressively worsening dizziness for 2 days and right hip pain for a few days.  She notes at rest she "feels fine" but when she attempts to stand up she becomes lightheaded and "feels drunk and off balance ".  She states this worsened today.  She denies any worsening numbness but notes chronic distal extremity paresthesias from her peripheral neuropathy which is unchanged.  She denies vision changes, chest pain, shortness of breath, nausea, vomiting, or abdominal pain.  She also notes constant sharp right hip pain which radiates down the right lower extremity and worsens with attempts to ambulate.  She ambulates with a cane at baseline.  Her son notes that she has been falling out of bed more frequently recently.  Most recently this occurred a few days ago. She is unsure if she hit her head or loss consciousness at the time.  She is on blood thinners.  She is a non-smoker.   Dizziness  Associated symptoms: no chest pain, no nausea, no shortness of breath, no vomiting and no weakness     Past Medical History:  Diagnosis Date  . Arthritis   . Borderline glaucoma of both eyes   . Depression   . Diabetic polyneuropathy ([place])   . Diverticulosis of colon   . Dry eyes, bilateral   . Feeling of incomplete bladder emptying   . History of breast cancer oncologist-  dr Waymon Budge-- per lov note no recurrence   dx 04/ 1999 --- Stage 3B-- s/p  chemotherapy then right mastectomy then concurrent chemoradiation therapy  . History of urinary retention    01/ 2018  . Hydronephrosis of right kidney   .  Hyperlipidemia   . Hypertension   . Hypothyroidism   . Insulin dependent type 2 diabetes mellitus [facility])    endocrinologist-  dr Cruzita Lederer---  last A1c 8.7 on 02-20-2016  . Lymphedema of upper extremity    right  . Macular degeneration, left eye    followed by dr Zigmund Daniel  . Renal insufficiency   . Urgency of urination     Patient Active Problem List   Diagnosis Date Noted  . Hydronephrosis 04/01/2017  . Sciatica of left side without back pain 01/30/2017  . Neuropathy 12/09/2016  . Hypokalemia 12/09/2016  . Hypomagnesemia 12/09/2016  . Genetic testing 11/27/2015  . Bilateral hip pain 02/24/2015  . Preventative health care 08/26/2012  . HTN, goal below 140/90 08/13/2012  . CKD (chronic kidney disease) stage 3, GFR 30-59 ml/min (HCC) 08/13/2012  . Glaucoma associated with systemic syndromes(365.44) 08/07/2012  . Hypothyroidism 04/16/2006  . Anemia 04/16/2006  . Depression 04/16/2006  . LOW BACK PAIN 04/16/2006  . BREAST CANCER, HX OF 04/16/2006  . Hyperlipidemia 10/13/2003  . Type 2 diabetes mellitus with diabetic polyneuropathy, with long-term current use of insulin ([place]) 04/16/1991    Past Surgical History:  Procedure Laterality Date  . CARPAL TUNNEL RELEASE Right 2017   and tendon repair  . CATARACT EXTRACTION W/ INTRAOCULAR LENS  IMPLANT, BILATERAL  2017  . CYSTO/  RIGHT RETROGRADE PYELOGRAM/  UNROOFING RIGHT URETEROCELE  09/18/2000  . CYSTOSCOPY/RETROGRADE/URETEROSCOPY Right 05/09/2016   Procedure: CYSTOSCOPY  and right RETROGRADE;  Surgeon: Irine Seal, MD;  Location: [facility];  Service: Urology;  Laterality: Right;  . FINGER SURGERY Left x2 prior to 09-09-2014  . I&D EXTREMITY Left 09/09/2014   Procedure: IRRIGATION AND DEBRIDEMENT LEFT THUMB DISTAL PHALANX;  Surgeon: Daryll Brod, MD;  Location: [facility];  Service: Orthopedics;  Laterality: Left;  [name] MASTECTOMY Right 1999   w/  Node dissection's  . PORT-A-CATH REMOVAL  05/01/1999  .  TUBAL LIGATION    . VAGINAL HYSTERECTOMY  1970's  . WRIST GANGLION EXCISION Right 2016     OB History   None      Home Medications    Prior to Admission medications   Medication Sig Start Date End Date Taking? Authorizing Provider  acetaminophen (MAPAP) 325 MG tablet Mapap (acetaminophen) 325 mg tablet    [provider]  ALPHAGAN P 0.1 % SOLN Place 1 drop into both eyes 3 (three) times daily. 06/09/17   [provider]  amLODipine (NORVASC) 10 MG tablet TAKE 1 TABLET(10 MG) BY MOUTH DAILY Patient not taking: Reported on 11/20/2017 11/13/17   Aldine Contes, MD  aspirin EC 81 MG tablet Take 81 mg by mouth at bedtime.    [provider]  Besifloxacin HCl (BESIVANCE) 0.6 % SUSP Besivance 0.6 % eye drops,suspension  INSTILL 1 DROP INTO THE LEFT EYE QID X 2 DAYS AFTER EACH MONTHLY EYE INJECTION    [provider]  buPROPion (WELLBUTRIN XL) 150 MG 24 hr tablet Take 1 tablet (150 mg total) by mouth daily. Patient not taking: Reported on 11/20/2017 11/19/17   Aldine Contes, MD  calcium citrate-vitamin D (CITRACAL+D) 315-200 MG-UNIT tablet Take 1 tablet by mouth 2 (two) times daily. Patient not taking: Reported on 11/20/2017 06/06/16 12/27/17  Aldine Contes, MD  Cholecalciferol (VITAMIN D3) 2000 units TABS Take 2,000 Units at bedtime by mouth.     [provider]  ciprofloxacin (CILOXAN) 0.3 % ophthalmic ointment Ciloxan 0.3 % eye ointment  APPLY TO LEFT EYE Q 2 H WHILE AWAKE    [provider]  Continuous Blood Gluc Receiver (FREESTYLE LIBRE 14 DAY READER) DEVI 1 each by Does not apply route 4 (four) times daily. 10/14/17   Aldine Contes, MD  Continuous Blood Gluc Sensor (FREESTYLE LIBRE 14 DAY SENSOR) MISC 1 each by Does not apply route 4 (four) times daily. 10/14/17   Aldine Contes, MD  cycloSPORINE (RESTASIS) 0.05 % ophthalmic emulsion Restasis MultiDose 0.05 % eye drops  INT 1 GTT IN [facility] EYE BID    [provider]  erythromycin ophthalmic ointment APPLY A SMALL AMOUNT INTO LEFT EYE TID 09/09/17   [provider]  glucose 4 GM chewable tablet Chew 4 tablets (16 g total) by mouth as needed for low blood sugar. 05/28/12   Dominic Pea, DO  glucose blood (ONETOUCH VERIO) test strip CHECK BLOOD SUGAR before meals and bedtime 10/14/17   Aldine Contes, MD  glucose blood test strip OneTouch Verio strips  USE TO CHECK BS BID    [provider]  hydrochlorothiazide (MICROZIDE) 12.5 MG capsule TAKE 1 CAPSULE BY MOUTH EVERY DAY 11/13/17   Aldine Contes, MD  ibuprofen (ADVIL,MOTRIN) 400 MG tablet ibuprofen 400 mg tablet    [provider]  insulin degludec (TRESIBA FLEXTOUCH) 100 UNIT/ML SOPN FlexTouch Pen Inject 0.18 mLs (18 Units total) into the skin daily. 07/29/17   Aldine Contes, MD  insulin lispro (HUMALOG KWIKPEN) 100 UNIT/ML KiwkPen Inject 0.05-0.07 mLs (5-7  Units total) into the skin 3 (three) times daily. 11/18/17   Philemon Kingdom, MD  Insulin Pen Needle (BD PEN NEEDLE NANO U/F) 32G X 4 MM MISC USE AS DIRECTED 09/10/17   Aldine Contes, MD  ketorolac (ACULAR) 0.5 % ophthalmic solution Place 1 drop into both eyes 3 (three) times daily. 06/11/17   [provider]  levothyroxine (SYNTHROID, LEVOTHROID) 88 MCG tablet Take 88 mcg daily, at least 30 minutes before breakfast. Patient taking differently: 75 mcg. Take 88 mcg daily, at least 30 minutes before breakfast. 11/19/17   Philemon Kingdom, MD  magnesium oxide (MAG-OX) 400 (241.3 Mg) MG tablet magnesium oxide 400 mg (241.3 mg magnesium) tablet  TK 1 T PO BID    [provider]  meclizine (ANTIVERT) 12.5 MG tablet Take 1 tablet (12.5 mg total) by mouth 3 (three) times daily as needed for dizziness. 11/29/17   Dryden Tapley A, PA-C  metFORMIN (GLUCOPHAGE-XR) 500 MG 24 hr tablet Take 2 tablets (1,000 mg total) by mouth 2 (two) times daily with a meal. 11/13/17   Aldine Contes, MD  Nutritional  Supplements (GLUCERNA SNACK SHAKE) LIQD Take 1 Dose by mouth 3 (three) times daily as needed. 08/06/17   Aldine Contes, MD  Olopatadine HCl (PATADAY) 0.2 % SOLN Place 1 drop into both eyes daily as needed (dry eyes).     [provider]  Jonetta Speak LANCETS FINE MISC USE TO CHECK BLOOD SUGAR before meals and bedtime 10/14/17   Aldine Contes, MD  Polyethyl Glycol-Propyl Glycol (SYSTANE) 0.4-0.3 % GEL ophthalmic gel Place 1 application daily as needed into both eyes (dry eyes/irritation).     [provider]  potassium chloride 20 MEQ/15ML (10%) SOLN potassium chloride 20 mEq/15 mL oral liquid  TK 15 MLS PO D PRN FOR MUSCLE CRAMPS    [provider]  prednisoLONE acetate (PRED FORTE) 1 % ophthalmic suspension  09/23/17   [provider]  pregabalin (LYRICA) 50 MG capsule Take 50 mg by mouth 3 (three) times daily.    [provider]  rosuvastatin (CRESTOR) 20 MG tablet TAKE 1 TABLET(20 MG) BY MOUTH DAILY 11/13/17   Aldine Contes, MD  Semaglutide,0.25 or 0.5MG /DOS, (OZEMPIC, 0.25 OR 0.5 MG/DOSE,) 2 MG/1.5ML SOPN Inject 0.5 mg into the skin once a week. 11/18/17   Philemon Kingdom, MD  traZODone (DESYREL) 50 MG tablet Take 50 mg by mouth at bedtime as needed for sleep.    [provider]  UNABLE TO FIND bacitracin-polymyxin B 500 unit-10,000 unit/gram eye ointment    [provider]  UNABLE TO FIND BD Ultra-Fine Nano Pen Needle 32 gauge x 5/32"  USE AS DIRECTED    [provider]  UNABLE TO FIND OneTouch Delica Lancets 30 gauge  USE TO CHECK BLOOD SUGAR BID    [provider]  valACYclovir (VALTREX) 500 MG tablet TK 1 T PO QD 09/17/17   [provider]  venlafaxine XR (EFFEXOR-XR) 150 MG 24 hr capsule TAKE 1 CAPSULE BY MOUTH EVERY DAY WITH BREAKFAST 11/13/17   Aldine Contes, MD    Family History Family History  Problem Relation Age of Onset  . Heart disease Father   . Diabetes Father   .  Stroke Sister   . Anuerysm Sister 62       brain; maternal half-sister  . Heart disease Brother   . Anuerysm Brother 52       aortic; maternal half-brother  . Breast cancer Daughter 32  negative genetic testing in 2015  . Heart attack Daughter        59-47  . Diabetes Paternal Grandmother   . Stroke Paternal Grandfather   . Anuerysm Brother        NOS type; full brother  . Fibroids Daughter        s/p hysterectomy at 36y  . Brain cancer Maternal Uncle        dx. older than 25; NOS type  . Brain cancer Cousin        maternal 1st cousin; d. early 21s; NOS type  . Deafness Paternal Uncle        prelingual    Social History Social History   Tobacco Use  . Smoking status: Never Smoker  . Smokeless tobacco: Never Used  Substance Use Topics  . Alcohol use: No    Alcohol/week: 0.0 standard drinks  . Drug use: No     Allergies   Gabapentin; Penicillins; and Sulfa antibiotics   Review of Systems Review of Systems  Constitutional: Negative for chills and fever.  Eyes: Negative for visual disturbance.  Respiratory: Negative for shortness of breath.   Cardiovascular: Negative for chest pain.  Gastrointestinal: Negative for abdominal pain, nausea and vomiting.  Musculoskeletal: Positive for arthralgias.  Neurological: Positive for dizziness and light-headedness. Negative for facial asymmetry and weakness.  All other systems reviewed and are negative.    Physical Exam Updated Vital Signs BP (!) 171/89 (BP Location: Left Arm)   Pulse 89   Temp 98.1 F (36.7 C) (Oral)   Resp 18   Ht 5\' 2"  (1.575 m)   Wt 52.1 kg   SpO2 100%   BMI 21.01 kg/m   Physical Exam  Constitutional: She appears well-developed and well-nourished. No distress.  HENT:  Head: Normocephalic and atraumatic.  No Battle's signs, no raccoon's eyes, no rhinorrhea. No hemotympanum. No tenderness to palpation of the face or skull. No deformity, crepitus, or swelling noted.   Eyes: Pupils are  equal, round, and reactive to light. Conjunctivae and EOM are normal. Right eye exhibits no discharge. Left eye exhibits no discharge.  Neck: No JVD present. No tracheal deviation present.  Cardiovascular: Normal rate, regular rhythm, normal heart sounds and intact distal pulses.  2+ radial and DP/PT pulses bilaterally, Homans sign absent bilaterally, no lower extremity edema, no palpable cords, compartments are soft   Pulmonary/Chest: Effort normal and breath sounds normal. She exhibits no tenderness.  Abdominal: Soft. Bowel sounds are normal. She exhibits no distension. There is no tenderness. There is no guarding.  Musculoskeletal: She exhibits no edema.  5/5 strength of BUE and BLE major muscle groups.  Pain elicited with passive flexion and external rotation of the right hip.  No crepitus or deformity noted.  Pelvis appears stable. No midline spine TTP, no paraspinal muscle tenderness, no deformity, crepitus, or step-off noted   Neurological: She is alert. A sensory deficit is present. No cranial nerve deficit. She exhibits normal muscle tone.  Mental Status:  Alert, thought content appropriate, mildly confused though overall able to give a coherent history. Speech fluent without evidence of aphasia. Able to follow 2 step commands without difficulty.  Cranial Nerves:  II:  Peripheral visual fields grossly normal, pupils equal, round, reactive to light III,IV, VI: ptosis not present, extra-ocular motions intact bilaterally  V,VII: smile symmetric, facial light touch sensation equal VIII: hearing grossly normal to voice  X: uvula elevates symmetrically  XI: bilateral shoulder shrug symmetric and strong XII: midline tongue  extension without fassiculations Motor:  Normal tone. 5/5 strength of BUE and BLE major muscle groups including strong and equal grip strength and dorsiflexion/plantar flexion Sensory: Altered sensation to light touch of the lateral aspect of the right lower  extremity Cerebellar: normal finger-to-nose with bilateral upper extremities Gait: Able to pull herself up to a standing position but unable to assess gait due to severe dizziness.  Romberg sign present.  No pronator drift. Persistent horizontal nystagmus.   Skin: Skin is warm and dry. No erythema.  Psychiatric: She has a normal mood and affect. Her behavior is normal.  Nursing note and vitals reviewed.    ED Treatments / Results  Labs (all labs ordered are listed, but only abnormal results are displayed) Labs Reviewed  BASIC METABOLIC PANEL - Abnormal; Notable for the following components:      Result Value   Potassium 3.1 (*)    Glucose, Bld 198 (*)    All other components within normal limits  CBC WITH DIFFERENTIAL/PLATELET - Abnormal; Notable for the following components:   RBC 3.74 (*)    Hemoglobin 11.5 (*)    HCT 35.2 (*)    All other components within normal limits    EKG None  Radiology Ct Head Wo Contrast  Result Date: 11/29/2017 CLINICAL DATA:  Dizziness for 2 days EXAM: CT HEAD WITHOUT CONTRAST TECHNIQUE: Contiguous axial images were obtained from the base of the skull through the vertex without intravenous contrast. COMPARISON:  06/27/2017 FINDINGS: Brain: No evidence of acute infarction, hemorrhage, hydrocephalus, extra-axial collection or mass lesion/mass effect. Vascular: No hyperdense vessel or unexpected calcification. Skull: Normal. Negative for fracture or focal lesion. Sinuses/Orbits: No acute finding. Other: Scalp lesion is noted just above the right supraorbital ridge. This is stable from the prior exam as well as a prior MRI dating back to 2008 consistent with a benign etiology. IMPRESSION: No acute intracranial abnormality noted. Electronically Signed   By: Inez Catalina M.D.   On: 11/29/2017 15:07   Mr Brain Wo Contrast (neuro Protocol)  Result Date: 11/29/2017 CLINICAL DATA:  Dizziness EXAM: MRI HEAD WITHOUT CONTRAST TECHNIQUE: Multiplanar, multiecho  pulse sequences of the brain and surrounding structures were obtained without intravenous contrast. COMPARISON:  CT head 11/29/2017, MRI 06/27/2017 FINDINGS: Brain: Image quality degraded by motion Negative for acute infarct. Scattered small white matter hyperintensities bilaterally unchanged from the prior study. Negative for hemorrhage or mass. Vascular: Normal arterial flow voids Skull and upper cervical spine: Negative skull. Sinuses/Orbits: Paranasal sinuses clear.  Bilateral cataract surgery Other: Right frontal scalp lesion unchanged from prior studies and stable. IMPRESSION: No acute abnormality. Mild chronic microvascular ischemic change in the white matter Electronically Signed   By: Franchot Gallo M.D.   On: 11/29/2017 20:22   Dg Hip Unilat With Pelvis 2-3 Views Right  Result Date: 11/29/2017 CLINICAL DATA:  Dizziness for 2 days with recent falls, initial encounter EXAM: DG HIP (WITH OR WITHOUT PELVIS) 3V RIGHT COMPARISON:  None. FINDINGS: The pelvic ring is intact. No acute fracture is seen. Degenerative changes of the hip joints are noted. The proximal femur appears within normal limits. No soft tissue abnormality is noted. IMPRESSION: Degenerative change without acute abnormality. Electronically Signed   By: Inez Catalina M.D.   On: 11/29/2017 15:08    Procedures Procedures (including critical care time)  Medications Ordered in ED Medications  meclizine (ANTIVERT) tablet 12.5 mg (12.5 mg Oral Given 11/29/17 1417)  sodium chloride 0.9 % bolus 1,000 mL (0 mLs Intravenous Stopped 11/29/17  1802)  LORazepam (ATIVAN) injection 1 mg (1 mg Intravenous Given 11/29/17 1626)     Initial Impression / Assessment and Plan / ED Course  I have reviewed the triage vital signs and the nursing notes.  Pertinent labs & imaging results that were available during my care of the patient were reviewed by me and considered in my medical decision making (see chart for details).     Patient presents for  evaluation of dizziness and right hip pain.  She is afebrile, intermittently hypertensive in the ED.  Vital signs otherwise stable.  She is nontoxic in appearance.  On initial examination she is having horizontal nystagmus and difficulty ambulating due to dizziness.  Lab work shows mild anemia, mild hypokalemia, no significant metabolic derangements, no leukocytosis.  Head CT shows no acute intracranial abnormality.  She is neurovascularly intact and able to bear weight on the right lower extremity.  Radiographs were obtained which showed degenerative changes but no fracture.  No evidence of septic joint. MRI was obtained to rule out stroke or other acute intracranial abnormality.  It showed no acute intracranial abnormality, mild chronic microvascular ischemic changes in the white matter.  She was given meclizine, Ativan, and IV fluids (she was orthostatic) improvement in her dizziness.  She was able to ambulate with the aid of a walker and also with minimal assistance by herself.  She does live with her son and has family visiting frequently.  No evidence of CVA at this time. Stable for discharge home with follow-up with PCP this week.  Will discharge with meclizine.  Discussed strict ED return precautions.  Patient and patient's family verbalized understanding of and agreement with plan and patient is stable for discharge home at this time.  Patient was seen and evaluated by Dr. Kathrynn Humble who agrees with assessment and plan. Final Clinical Impressions(s) / ED Diagnoses   Final diagnoses:  Dizziness  Right hip pain    ED Discharge Orders         Ordered    For home use only DME 4 wheeled rolling walker with seat     11/29/17 2102    meclizine (ANTIVERT) 12.5 MG tablet  3 times daily PRN     11/29/17 2103           Renita Papa, PA-C 11/29/17 2115    Varney Biles, MD 11/30/17 [phone]

## 2017-11-29 NOTE — ED Notes (Signed)
Pt transported to CT ?

## 2017-11-29 NOTE — ED Triage Notes (Signed)
Per EMS, pt complains of dizziness since last night. Pt states dizziness has gotten worse today. Pt has hx of dizziness but has not been diagnosed with vertigo. 12 lead showed NSR.   150/90 Hr 100 CBG 201 O2 98%

## 2017-11-29 NOTE — Discharge Instructions (Signed)
Drink plenty of fluids and get plenty of rest.  Start taking meclizine up to 3 times a day as needed for dizziness.  We will be in contact to deliver a walker to your home.  You can take Tylenol every 6 hours as needed for pain in your joints.  Follow-up with your primary care physician for reevaluation of your dizziness.  Return to the emergency department if any concerning signs or symptoms develop such as slurred speech, weakness, difficulty walking or talking.

## 2017-11-30 ENCOUNTER — Telehealth: Payer: Self-pay | Admitting: Surgery

## 2017-11-30 NOTE — Telephone Encounter (Addendum)
ED CM received CM consult from in-basket Pool concerning the need for rolling walker with seat. CM reviewed record orders placed. Attempted to contact patient no answer or VM set up, will make second attempt later today 11/30/17

## 2017-12-04 ENCOUNTER — Ambulatory Visit: Payer: Self-pay | Admitting: Pharmacist

## 2017-12-04 ENCOUNTER — Ambulatory Visit: Payer: Medicare Other | Admitting: Pharmacist

## 2017-12-04 DIAGNOSIS — F32A Depression, unspecified: Secondary | ICD-10-CM

## 2017-12-04 DIAGNOSIS — Z794 Long term (current) use of insulin: Principal | ICD-10-CM

## 2017-12-04 DIAGNOSIS — M85851 Other specified disorders of bone density and structure, right thigh: Secondary | ICD-10-CM

## 2017-12-04 DIAGNOSIS — F329 Major depressive disorder, single episode, unspecified: Secondary | ICD-10-CM

## 2017-12-04 DIAGNOSIS — E1142 Type 2 diabetes mellitus with diabetic polyneuropathy: Secondary | ICD-10-CM

## 2017-12-04 DIAGNOSIS — E039 Hypothyroidism, unspecified: Secondary | ICD-10-CM

## 2017-12-04 DIAGNOSIS — I10 Essential (primary) hypertension: Secondary | ICD-10-CM

## 2017-12-04 NOTE — Progress Notes (Signed)
S: [name] is a 67 y.o. female reports to clinical pharmacist appointment for diabetes medication management. Patient did  bring medication bottles. Patient is accompanied by her sister in law.    Patient reports she has been remember to take her medications with the alarms set at a previous visit. She has been checking her blood glucose daily.  She reports her her new metformin (2 tabs 500 mg XR BID instead of 1 tab 1000 mg BID) is causing stomach upset. Looking at her medication blister packs, it was determined that she was taking double of her evening medications, which is most likely what was causing her stomach upset.   Patient reports current diabetic regimen includes: Metformin 500 mg XR 2 tablets BID, Ozempic 0.5mg  every Sunday, Tresiba 16 units daily, and Humalog 5-7 units with meals.   Of note, patient had a recent fall that resulted in a visit to the ED. She did not have a follow-up appointment scheduled. She is now scheduled for 11/8 at 9:30am.   Allergies  Allergen Reactions  . Gabapentin Other (See Comments)    Dizziness from 300 mg, tolerates 100 mg Dizziness from 300 mg, tolerates 100 mg   . Penicillins Rash    Has patient had a PCN reaction causing immediate rash, facial/tongue/throat swelling, SOB or lightheadedness with hypotension: Yes Has patient had a PCN reaction causing severe rash involving mucus membranes or skin necrosis: No Has patient had a PCN reaction that required hospitalization: pt was in hospital at time of reaction Has patient had a PCN reaction occurring within the last 10 years: No If all of the above answers are "NO", then may proceed with Cephalosporin use.  . Sulfa Antibiotics Rash   Prior to Admission medications   Medication Sig Start Date End Date Taking? Authorizing Provider  acetaminophen (MAPAP) 325 MG tablet Mapap (acetaminophen) 325 mg tablet    [provider]  ALPHAGAN P 0.1 % SOLN Place 1 drop into both eyes 3 (three)  times daily. 06/09/17   [provider]  amLODipine (NORVASC) 10 MG tablet TAKE 1 TABLET(10 MG) BY MOUTH DAILY Patient not taking: Reported on 11/20/2017 11/13/17   Aldine Contes, MD  aspirin EC 81 MG tablet Take 81 mg by mouth at bedtime.    [provider]  Besifloxacin HCl (BESIVANCE) 0.6 % SUSP Besivance 0.6 % eye drops,suspension  INSTILL 1 DROP INTO THE LEFT EYE QID X 2 DAYS AFTER EACH MONTHLY EYE INJECTION    [provider]  buPROPion (WELLBUTRIN XL) 150 MG 24 hr tablet Take 1 tablet (150 mg total) by mouth daily. Patient not taking: Reported on 11/20/2017 11/19/17   Aldine Contes, MD  calcium citrate-vitamin D (CITRACAL+D) 315-200 MG-UNIT tablet Take 1 tablet by mouth 2 (two) times daily. Patient not taking: Reported on 11/20/2017 06/06/16 12/27/17  Aldine Contes, MD  Cholecalciferol (VITAMIN D3) 2000 units TABS Take 2,000 Units at bedtime by mouth.     [provider]  ciprofloxacin (CILOXAN) 0.3 % ophthalmic ointment Ciloxan 0.3 % eye ointment  APPLY TO LEFT EYE Q 2 H WHILE AWAKE    [provider]  Continuous Blood Gluc Receiver (FREESTYLE LIBRE 14 DAY READER) DEVI 1 each by Does not apply route 4 (four) times daily. 10/14/17   Aldine Contes, MD  Continuous Blood Gluc Sensor (FREESTYLE LIBRE 14 DAY SENSOR) MISC 1 each by Does not apply route 4 (four) times daily. 10/14/17   Aldine Contes, MD  cycloSPORINE (RESTASIS) 0.05 % ophthalmic emulsion  Restasis MultiDose 0.05 % eye drops  INT 1 GTT IN [facility] EYE BID    [provider]  erythromycin ophthalmic ointment APPLY A SMALL AMOUNT INTO LEFT EYE TID 09/09/17   [provider]  glucose 4 GM chewable tablet Chew 4 tablets (16 g total) by mouth as needed for low blood sugar. 05/28/12   Dominic Pea, DO  glucose blood (ONETOUCH VERIO) test strip CHECK BLOOD SUGAR before meals and bedtime 10/14/17   Aldine Contes, MD  glucose blood test strip OneTouch Verio  strips  USE TO CHECK BS BID    [provider]  hydrochlorothiazide (MICROZIDE) 12.5 MG capsule TAKE 1 CAPSULE BY MOUTH EVERY DAY 11/13/17   Aldine Contes, MD  ibuprofen (ADVIL,MOTRIN) 400 MG tablet ibuprofen 400 mg tablet    [provider]  insulin degludec (TRESIBA FLEXTOUCH) 100 UNIT/ML SOPN FlexTouch Pen Inject 0.18 mLs (18 Units total) into the skin daily. 07/29/17   Aldine Contes, MD  insulin lispro (HUMALOG KWIKPEN) 100 UNIT/ML KiwkPen Inject 0.05-0.07 mLs (5-7 Units total) into the skin 3 (three) times daily. 11/18/17   Philemon Kingdom, MD  Insulin Pen Needle (BD PEN NEEDLE NANO U/F) 32G X 4 MM MISC USE AS DIRECTED 09/10/17   Aldine Contes, MD  ketorolac (ACULAR) 0.5 % ophthalmic solution Place 1 drop into both eyes 3 (three) times daily. 06/11/17   [provider]  levothyroxine (SYNTHROID, LEVOTHROID) 88 MCG tablet Take 88 mcg daily, at least 30 minutes before breakfast. Patient taking differently: 75 mcg. Take 88 mcg daily, at least 30 minutes before breakfast. 11/19/17   Philemon Kingdom, MD  magnesium oxide (MAG-OX) 400 (241.3 Mg) MG tablet magnesium oxide 400 mg (241.3 mg magnesium) tablet  TK 1 T PO BID    [provider]  meclizine (ANTIVERT) 12.5 MG tablet Take 1 tablet (12.5 mg total) by mouth 3 (three) times daily as needed for dizziness. 11/29/17   Fawze, Mina A, PA-C  metFORMIN (GLUCOPHAGE-XR) 500 MG 24 hr tablet Take 2 tablets (1,000 mg total) by mouth 2 (two) times daily with a meal. 11/13/17   Aldine Contes, MD  Nutritional Supplements (GLUCERNA SNACK SHAKE) LIQD Take 1 Dose by mouth 3 (three) times daily as needed. 08/06/17   Aldine Contes, MD  Olopatadine HCl (PATADAY) 0.2 % SOLN Place 1 drop into both eyes daily as needed (dry eyes).     [provider]  Jonetta Speak LANCETS FINE MISC USE TO CHECK BLOOD SUGAR before meals and bedtime 10/14/17   Aldine Contes, MD  Polyethyl Glycol-Propyl Glycol (SYSTANE)  0.4-0.3 % GEL ophthalmic gel Place 1 application daily as needed into both eyes (dry eyes/irritation).     [provider]  potassium chloride 20 MEQ/15ML (10%) SOLN potassium chloride 20 mEq/15 mL oral liquid  TK 15 MLS PO D PRN FOR MUSCLE CRAMPS    [provider]  prednisoLONE acetate (PRED FORTE) 1 % ophthalmic suspension  09/23/17   [provider]  pregabalin (LYRICA) 50 MG capsule Take 50 mg by mouth 3 (three) times daily.    [provider]  rosuvastatin (CRESTOR) 20 MG tablet TAKE 1 TABLET(20 MG) BY MOUTH DAILY 11/13/17   Aldine Contes, MD  Semaglutide,0.25 or 0.5MG /DOS, (OZEMPIC, 0.25 OR 0.5 MG/DOSE,) 2 MG/1.5ML SOPN Inject 0.5 mg into the skin once a week. 11/18/17   Philemon Kingdom, MD  traZODone (DESYREL) 50 MG tablet Take 50 mg by mouth at bedtime as needed for sleep.    [provider]  UNABLE TO FIND bacitracin-polymyxin B 500 unit-10,000 unit/gram eye ointment    [provider]  UNABLE TO FIND BD Ultra-Fine Nano Pen Needle 32 gauge x 5/32"  USE AS DIRECTED    [provider]  UNABLE TO FIND OneTouch Delica Lancets 30 gauge  USE TO CHECK BLOOD SUGAR BID    [provider]  valACYclovir (VALTREX) 500 MG tablet TK 1 T PO QD 09/17/17   [provider]  venlafaxine XR (EFFEXOR-XR) 150 MG 24 hr capsule TAKE 1 CAPSULE BY MOUTH EVERY DAY WITH BREAKFAST 11/13/17   Aldine Contes, MD   Past Medical History:  Diagnosis Date  . Arthritis   . Borderline glaucoma of both eyes   . Depression   . Diabetic polyneuropathy ([place])   . Diverticulosis of colon   . Dry eyes, bilateral   . Feeling of incomplete bladder emptying   . History of breast cancer oncologist-  dr Waymon Budge-- per lov note no recurrence   dx 04/ 1999 --- Stage 3B-- s/p  chemotherapy then right mastectomy then concurrent chemoradiation therapy  . History of urinary retention    01/ 2018  . Hydronephrosis of right kidney   .  Hyperlipidemia   . Hypertension   . Hypothyroidism   . Insulin dependent type 2 diabetes mellitus [facility])    endocrinologist-  dr Cruzita Lederer---  last A1c 8.7 on 02-20-2016  . Lymphedema of upper extremity    right  . Macular degeneration, left eye    followed by dr Zigmund Daniel  . Renal insufficiency   . Urgency of urination    Social History   Socioeconomic History  . Marital status: Widowed    Spouse name: Not on file  . Number of children: Not on file  . Years of education: Not on file  . Highest education level: Not on file  Occupational History  . Not on file  Social Needs  . Financial resource strain: Not on file  . Food insecurity:    Worry: Not on file    Inability: Not on file  . Transportation needs:    Medical: Not on file    Non-medical: Not on file  Tobacco Use  . Smoking status: Never Smoker  . Smokeless tobacco: Never Used  Substance and Sexual Activity  . Alcohol use: No    Alcohol/week: 0.0 standard drinks  . Drug use: No  . Sexual activity: Not on file  Lifestyle  . Physical activity:    Days per week: Not on file    Minutes per session: Not on file  . Stress: Not on file  Relationships  . Social connections:    Talks on phone: Not on file    Gets together: Not on file    Attends religious service: Not on file    Active member of club or organization: Not on file    Attends meetings of clubs or organizations: Not on file    Relationship status: Not on file  Other Topics Concern  . Not on file  Social History Narrative  . Not on file   Family History  Problem Relation Age of Onset  . Heart disease Father   . Diabetes Father   . Stroke Sister   . Anuerysm Sister 47       brain; maternal half-sister  . Heart disease Brother   . Anuerysm Brother 38       aortic; maternal half-brother  . Breast cancer Daughter 68  negative genetic testing in 2015  . Heart attack Daughter        44-47  . Diabetes Paternal Grandmother   . Stroke Paternal  Grandfather   . Anuerysm Brother        NOS type; full brother  . Fibroids Daughter        s/p hysterectomy at 57y  . Brain cancer Maternal Uncle        dx. older than 53; NOS type  . Brain cancer Cousin        maternal 1st cousin; d. early 83s; NOS type  . Deafness Paternal Uncle        prelingual    O:    Component Value Date/Time   CHOL 189 10/14/2017 1150   HDL 59 10/14/2017 1150   TRIG 111 10/14/2017 1150   AST 22 06/27/2017 1742   AST 16 08/09/2013 0934   ALT 20 06/27/2017 1742   ALT 12 08/09/2013 0934   NA 142 11/29/2017 1424   NA 140 10/21/2017 1105   NA 143 08/09/2013 0934   K 3.1 (L) 11/29/2017 1424   K 3.4 (L) 08/09/2013 0934   CL 103 11/29/2017 1424   CO2 30 11/29/2017 1424   CO2 25 08/09/2013 0934   GLUCOSE 198 (H) 11/29/2017 1424   GLUCOSE 192 (H) 08/09/2013 0934   HGBA1C >14.0 (A) 10/14/2017 1109   BUN 15 11/29/2017 1424   BUN 13 10/21/2017 1105   BUN 27.3 (H) 08/09/2013 0934   CREATININE 0.89 11/29/2017 1424   CREATININE 1.15 (H) 05/16/2014 1036   CREATININE 1.4 (H) 08/09/2013 0934   CALCIUM 9.0 11/29/2017 1424   CALCIUM 10.3 08/09/2013 0934   GFRNONAA >60 11/29/2017 1424   GFRNONAA 51 (L) 05/16/2014 1036   GFRAA >60 11/29/2017 1424   GFRAA 59 (L) 05/16/2014 1036   WBC 5.0 11/29/2017 1424   HGB 11.5 (L) 11/29/2017 1424   HGB 11.7 12/09/2016 1449   HGB 11.9 08/09/2013 0934   HCT 35.2 (L) 11/29/2017 1424   HCT 34.5 12/09/2016 1449   HCT 35.8 08/09/2013 0934   PLT 218 11/29/2017 1424   PLT 283 12/09/2016 1449   TSH 9.16 (H) 11/18/2017 1505   Ht Readings from Last 2 Encounters:  11/29/17 5\' 2"  (1.575 m)  11/18/17 5\' 2"  (1.575 m)   Wt Readings from Last 2 Encounters:  11/29/17 114 lb 13.8 oz (52.1 kg)  11/18/17 117 lb (53.1 kg)   There is no height or weight on file to calculate BMI. BP Readings from Last 3 Encounters:  11/29/17 (!) 168/78  11/20/17 132/72  11/18/17 122/70     A/P: Patient is become more adherent to her pills with  blister packing, but the type of blister pack is causing confusion. I will call around to other pharmacies to find a better blister pack for her. I think Ms. Elbert Ewings would benefit from more help at home to manage her medications. She also expressed interest in talking to [place].   Findings/Recommendations:   Continue all medications, we did not make any changes today.   Counseled on correct usage of blister packs; will look for another pharmacy who may have a better type of pack for Ms. Schlie.   An after visit summary was provided and patient advised to follow up in 2-3 weeks or sooner if any changes in condition or questions regarding medications arise. She also has an appointment with [facility] tomorrow morning (11/8) to follow-up on recent ED visit for a fall.  The patient verbalized understanding of information provided by repeating back concepts discussed.   60 minutes spent face-to-face with the patient during the encounter. 50% of time spent on education. 50% of time was spent on coordinating care and creating assessment and plan for diabetes management.   Kathyrn Sheriff  PharmD Student

## 2017-12-04 NOTE — Patient Instructions (Signed)
It was a pleasure to see you today, [name]!  Please continue to take all of your medications! We did not make any changes today. I am working on finding another pharmacy to do your blister packs.   Please see [place] again in 2-3 weeks, or sooner if you have any problems. You are also coming in tomorrow (11/8) morning at 9:30am to be seen by a doctor about your falls and dizziness.

## 2017-12-05 ENCOUNTER — Other Ambulatory Visit: Payer: Self-pay

## 2017-12-05 ENCOUNTER — Ambulatory Visit (INDEPENDENT_AMBULATORY_CARE_PROVIDER_SITE_OTHER): Payer: Medicare Other | Admitting: Internal Medicine

## 2017-12-05 ENCOUNTER — Telehealth: Payer: Self-pay | Admitting: Emergency Medicine

## 2017-12-05 DIAGNOSIS — R2681 Unsteadiness on feet: Secondary | ICD-10-CM | POA: Diagnosis not present

## 2017-12-05 DIAGNOSIS — R42 Dizziness and giddiness: Secondary | ICD-10-CM

## 2017-12-05 DIAGNOSIS — H9313 Tinnitus, bilateral: Secondary | ICD-10-CM | POA: Diagnosis not present

## 2017-12-05 DIAGNOSIS — Z79899 Other long term (current) drug therapy: Secondary | ICD-10-CM

## 2017-12-05 HISTORY — DX: Dizziness and giddiness: R42

## 2017-12-05 NOTE — Patient Instructions (Signed)
Thank you for coming to the clinic today. It was a pleasure to see you.   Please hold off on taking your Hydrochlorothiazide   FOLLOW-UP INSTRUCTIONS When: 1 month with Dr. Dareen Piano For: follow up of your dizziness  What to bring: all of your medication bottles   Please call the internal medicine center clinic if you have any questions or concerns, we may be able to help and keep you from a long and expensive emergency room wait. Our clinic and after hours phone number is [phone], the best time to call is Monday through Friday 9 am to 4 pm but there is always someone available 24/7 if you have an emergency. If you need medication refills please notify your pharmacy one week in advance and they will send [place] a request.

## 2017-12-05 NOTE — Assessment & Plan Note (Signed)
A unsteady sensation of her legs, dizziness, ringing sensation in her ears and started a couple of weeks ago. She notices the dizziness immediately after she gets up in the morning and before bed time, it also affects her at some points throughout the day such as when she stands up to walk, when she is walking, and when she turns her head to the right. She has had dizziness similar to this in the past. Last A1c was 14 and CGM was placed last month and did not reveal hypglyemia. Review of her blood glucose monitor today does show one hypoglycemic reading of 60 one evening over the past month. She does not believe she has been started on any new medications recently but on review of the pharmacy visit yesterday it appears that she was taking double doses of her evening medication due to misunderstanding of the blister pack system. These evening medications included trazodone and metformin, double doses of these medications could explain her symptoms. She denies presyncope, chest pain, palpitations, or shortness of breath. CT head and MRI when she presented to the ED for these symptoms days ago were negative for intracranial abnormalities however MRI did show chronic microvascular changes. She was started on meclizine but did not ever try using this medication.  Symptoms are most likely secondary to the mix up in pill packs and double dose of trazodone that she was taking.   -orthostatic vitals negative today, she has not yet taken her antihypertensives  - reconciled on the proper way to use current pill pack, we really appreciate the pharmacy teams assistance in [name]' care - close follow up with PCP to evaluate for resolution of symptoms and medication review

## 2017-12-05 NOTE — Progress Notes (Signed)
CC: follow up of dizziness   HPI:  [name] is a 67 y.o. with PMH as listed below who presents for follow up of dizziness. Please see the assessment and plans for the status of the patient chronic medical problems.    Past Medical History:  Diagnosis Date  . Arthritis   . Borderline glaucoma of both eyes   . Depression   . Diabetic polyneuropathy ([place])   . Diverticulosis of colon   . Dry eyes, bilateral   . Feeling of incomplete bladder emptying   . History of breast cancer oncologist-  dr Waymon Budge-- per lov note no recurrence   dx 04/ 1999 --- Stage 3B-- s/p  chemotherapy then right mastectomy then concurrent chemoradiation therapy  . History of urinary retention    01/ 2018  . Hydronephrosis of right kidney   . Hyperlipidemia   . Hypertension   . Hypothyroidism   . Insulin dependent type 2 diabetes mellitus [facility])    endocrinologist-  dr Cruzita Lederer---  last A1c 8.7 on 02-20-2016  . Lymphedema of upper extremity    right  . Macular degeneration, left eye    followed by dr Zigmund Daniel  . Renal insufficiency   . Urgency of urination    Review of Systems:  Refer to history of present illness and assessment and plans for pertinent review of systems, all others reviewed and negative  Physical Exam:  Vitals:   12/05/17 0953  BP: 120/61  Pulse: 96  Temp: 98.8 F (37.1 C)  TempSrc: Oral  SpO2: 100%  Weight: 120 lb (54.4 kg)  Height: 5\' 2"  (1.575 m)   General: well appearing, no acute distress, underweight  Cardiac: regular rate and rhythm, no murmurs  Pulm: normal work of breathing, lungs clear to auscultation  Gait: unsteady leaning gait, needs support of the walker and her son to stay balanced  Neuro: no facial droop, pupils equally round and reactive, EOMI intact, no nystagmus, shoulder shrug strong, upper and lower extremity proximal and distal strength 5/5, normal head impulse testing, negative test of skew   Assessment & Plan:   Dizziness A unsteady  sensation of her legs, dizziness, ringing sensation in her ears and started a couple of weeks ago. She notices the dizziness immediately after she gets up in the morning and before bed time, it also affects her at some points throughout the day such as when she stands up to walk, when she is walking, and when she turns her head to the right. She has had dizziness similar to this in the past. Last A1c was 14 and CGM was placed last month and did not reveal hypglyemia. Review of her blood glucose monitor today does show one hypoglycemic reading of 60 one evening over the past month. She does not believe she has been started on any new medications recently but on review of the pharmacy visit yesterday it appears that she was taking double doses of her evening medication due to misunderstanding of the blister pack system. These evening medications included trazodone and metformin, double doses of these medications could explain her symptoms. She denies presyncope, chest pain, palpitations, or shortness of breath. CT head and MRI when she presented to the ED for these symptoms days ago were negative for intracranial abnormalities however MRI did show chronic microvascular changes. She was started on meclizine but did not ever try using this medication.  Symptoms are most likely secondary to the mix up in pill packs and double dose of trazodone  that she was taking.   -orthostatic vitals negative today, she has not yet taken her antihypertensives  - reconciled on the proper way to use current pill pack, we really appreciate the pharmacy teams assistance in [name]' care - close follow up with PCP to evaluate for resolution of symptoms and medication review   See Encounters Tab for problem based charting.  Patient discussed with Dr. Angelia Mould

## 2017-12-05 NOTE — Telephone Encounter (Signed)
CM received a call from pt regarding RW.  Pt requested to go by the store to pick up the DME today.  CM contacted Santiago Glad with [facility] who is aware.  No further CM needs noted at this time.

## 2017-12-08 ENCOUNTER — Other Ambulatory Visit: Payer: Self-pay | Admitting: Internal Medicine

## 2017-12-08 NOTE — Progress Notes (Signed)
Internal Medicine Clinic Attending  Case discussed with Dr. Blum at the time of the visit.  We reviewed the resident's history and exam and pertinent patient test results.  I agree with the assessment, diagnosis, and plan of care documented in the resident's note. 

## 2017-12-10 ENCOUNTER — Telehealth: Payer: Self-pay | Admitting: Licensed Clinical Social Worker

## 2017-12-10 DIAGNOSIS — H1712 Central corneal opacity, left eye: Secondary | ICD-10-CM | POA: Diagnosis not present

## 2017-12-10 DIAGNOSIS — H31092 Other chorioretinal scars, left eye: Secondary | ICD-10-CM | POA: Diagnosis not present

## 2017-12-10 DIAGNOSIS — H52212 Irregular astigmatism, left eye: Secondary | ICD-10-CM | POA: Diagnosis not present

## 2017-12-10 DIAGNOSIS — H16223 Keratoconjunctivitis sicca, not specified as Sjogren's, bilateral: Secondary | ICD-10-CM | POA: Diagnosis not present

## 2017-12-10 DIAGNOSIS — H04123 Dry eye syndrome of bilateral lacrimal glands: Secondary | ICD-10-CM | POA: Diagnosis not present

## 2017-12-10 NOTE — Telephone Encounter (Signed)
Patient was contacted to schedule a future appointment due to receiving a recent referral for services. No answer, no voicemail was available.

## 2017-12-12 DIAGNOSIS — M5432 Sciatica, left side: Secondary | ICD-10-CM | POA: Diagnosis not present

## 2017-12-12 DIAGNOSIS — R42 Dizziness and giddiness: Secondary | ICD-10-CM | POA: Diagnosis not present

## 2017-12-18 ENCOUNTER — Encounter: Payer: Self-pay | Admitting: Dietician

## 2017-12-18 ENCOUNTER — Ambulatory Visit: Payer: Medicare Other | Admitting: Licensed Clinical Social Worker

## 2017-12-18 ENCOUNTER — Ambulatory Visit: Payer: Medicare Other | Admitting: Dietician

## 2017-12-18 DIAGNOSIS — Z794 Long term (current) use of insulin: Principal | ICD-10-CM

## 2017-12-18 DIAGNOSIS — E1142 Type 2 diabetes mellitus with diabetic polyneuropathy: Secondary | ICD-10-CM

## 2017-12-18 DIAGNOSIS — F4321 Adjustment disorder with depressed mood: Secondary | ICD-10-CM

## 2017-12-18 NOTE — Progress Notes (Signed)
Diabetes Group meeting Documentation:  start time:10:15 AM   end time: [name] attended diabetes education group visit today for 60 minutes. The program included successes and challenges over the past few weeks. We discussed healthy food choices including easy and healthy snack using popcorn and the microwave. Our activity was a hands on meal planning for a day( 3 meals) using the plate method.   Blood sugar: meter was downloaded today. The average number of times testing over the past 30 days was 1.5, the average is 208+/-111 mg/dL, 58-462 mg/dL,  the percentage of time in range is 20, % of time hypo is 2/46, % of time above target range is 78 Before breakfast Before lunch  Before Dinner Bedtime/midsleep  86-247,  5/16<100, 4/16 between 100-150 98-402 58-463, most in 200-300s 61/103/77/237   Wt Readings from Last 5 Encounters:  12/05/17 120 lb (54.4 kg)  11/29/17 114 lb 13.8 oz (52.1 kg)  11/18/17 117 lb (53.1 kg)  10/21/17 118 lb 4.8 oz (53.7 kg)  10/14/17 119 lb 12.8 oz (54.3 kg)    TDD ~ 33-39 units/day, use 36 units ICR ~ 1:12.5-1:14 CF  50  For 5 units Humalog she should eat ~ 60- 70 grams carb for meals. She is unlikely to eat this much, especially with Ozempic effects on appetite. Suggest decreasing base amount of prandial insulin to 3 units with small meal,  4 units with larger meal (42-56 gram carb at meals) and have her add 1 unit for each 50 mg/dl above 150.  This change may assist with decreasing her blood sugar variability, hypoglycemia and fear of hypoglycemia. Could consider an [place] for meal time dosing. Continue to try to help her obtain an CGM. Consider decrease in Ozempic to decrease appetite suppression. She has a history of great fear of hypoglycemia and neuropathy.   The goal she/he is working on SK:AJGOTL her medicine as directed.  Her goal for the next few weeks is eating balanced meals to "sustain" her. Her son is helping her with cooking  meals  Plan: attend diabetes group meeting next month Debera Lat, RD 12/18/2017 2:07 PM.

## 2017-12-19 ENCOUNTER — Telehealth: Payer: Self-pay

## 2017-12-19 NOTE — Telephone Encounter (Signed)
Needs to speak with a nurse about having leg pain. Please call pt back.

## 2017-12-19 NOTE — Telephone Encounter (Signed)
Pt calls and states her R leg is getting much worse and she needs some pain pills, please advise

## 2017-12-19 NOTE — Telephone Encounter (Signed)
Please have her follow up in [facility]. She needs to be evaluated. Thank you

## 2017-12-19 NOTE — Telephone Encounter (Signed)
appt 11/25 at 1445 [facility]

## 2017-12-22 ENCOUNTER — Other Ambulatory Visit: Payer: Medicare Other | Admitting: Orthotics

## 2017-12-22 ENCOUNTER — Encounter (INDEPENDENT_AMBULATORY_CARE_PROVIDER_SITE_OTHER): Payer: Medicare Other | Admitting: Ophthalmology

## 2017-12-22 ENCOUNTER — Other Ambulatory Visit: Payer: Self-pay

## 2017-12-22 ENCOUNTER — Ambulatory Visit: Payer: Self-pay

## 2017-12-22 ENCOUNTER — Emergency Department (HOSPITAL_COMMUNITY)
Admission: EM | Admit: 2017-12-22 | Discharge: 2017-12-22 | Disposition: A | Discharge status: Home / Self Care | Payer: Medicare Other | Attending: Emergency Medicine | Admitting: Emergency Medicine

## 2017-12-22 ENCOUNTER — Encounter: Payer: Self-pay | Admitting: Licensed Clinical Social Worker

## 2017-12-22 DIAGNOSIS — I129 Hypertensive chronic kidney disease with stage 1 through stage 4 chronic kidney disease, or unspecified chronic kidney disease: Secondary | ICD-10-CM | POA: Insufficient documentation

## 2017-12-22 DIAGNOSIS — R55 Syncope and collapse: Secondary | ICD-10-CM | POA: Diagnosis not present

## 2017-12-22 DIAGNOSIS — N183 Chronic kidney disease, stage 3 (moderate): Secondary | ICD-10-CM | POA: Diagnosis not present

## 2017-12-22 DIAGNOSIS — F410 Panic disorder [episodic paroxysmal anxiety] without agoraphobia: Secondary | ICD-10-CM | POA: Diagnosis present

## 2017-12-22 DIAGNOSIS — Z7982 Long term (current) use of aspirin: Secondary | ICD-10-CM | POA: Insufficient documentation

## 2017-12-22 DIAGNOSIS — R0602 Shortness of breath: Secondary | ICD-10-CM | POA: Diagnosis not present

## 2017-12-22 DIAGNOSIS — Z79899 Other long term (current) drug therapy: Secondary | ICD-10-CM | POA: Diagnosis not present

## 2017-12-22 DIAGNOSIS — E039 Hypothyroidism, unspecified: Secondary | ICD-10-CM | POA: Insufficient documentation

## 2017-12-22 DIAGNOSIS — R112 Nausea with vomiting, unspecified: Secondary | ICD-10-CM | POA: Diagnosis not present

## 2017-12-22 DIAGNOSIS — R0689 Other abnormalities of breathing: Secondary | ICD-10-CM | POA: Diagnosis not present

## 2017-12-22 DIAGNOSIS — R064 Hyperventilation: Secondary | ICD-10-CM | POA: Diagnosis not present

## 2017-12-22 DIAGNOSIS — R42 Dizziness and giddiness: Secondary | ICD-10-CM | POA: Diagnosis not present

## 2017-12-22 DIAGNOSIS — Z743 Need for continuous supervision: Secondary | ICD-10-CM | POA: Diagnosis not present

## 2017-12-22 LAB — COMPREHENSIVE METABOLIC PANEL
ALT: 17 U/L (ref 0–44)
AST: 32 U/L (ref 15–41)
Albumin: 4.3 g/dL (ref 3.5–5.0)
Alkaline Phosphatase: 66 U/L (ref 38–126)
Anion gap: 12 (ref 5–15)
BUN: 12 mg/dL (ref 8–23)
CO2: 25 mmol/L (ref 22–32)
Calcium: 10.3 mg/dL (ref 8.9–10.3)
Chloride: 103 mmol/L (ref 98–111)
Creatinine, Ser: 1.06 mg/dL — ABNORMAL HIGH (ref 0.44–1.00)
GFR calc Af Amer: >60 mL/min (ref >60)
GFR calc non Af Amer: 53 mL/min — ABNORMAL LOW (ref >60)
Glucose, Bld: 111 mg/dL — ABNORMAL HIGH (ref 70–99)
Potassium: 4.3 mmol/L (ref 3.5–5.1)
Sodium: 140 mmol/L (ref 135–145)
Total Bilirubin: 0.5 mg/dL (ref 0.3–1.2)
Total Protein: 7.7 g/dL (ref 6.5–8.1)

## 2017-12-22 LAB — I-STAT TROPONIN, ED: Troponin i, poc: 0 ng/mL (ref 0.00–0.08)

## 2017-12-22 LAB — CBC WITH DIFFERENTIAL/PLATELET
Abs Immature Granulocytes: 0.06 10*3/uL (ref 0.00–0.07)
Basophils Absolute: 0 10*3/uL (ref 0.0–0.1)
Basophils Relative: 0 %
Eosinophils Absolute: 0.1 10*3/uL (ref 0.0–0.5)
Eosinophils Relative: 1 %
HCT: 36.6 % (ref 36.0–46.0)
Hemoglobin: 11.9 g/dL — ABNORMAL LOW (ref 12.0–15.0)
Immature Granulocytes: 1 %
Lymphocytes Relative: 16 %
Lymphs Abs: 1.5 10*3/uL (ref 0.7–4.0)
MCH: 30.8 pg (ref 26.0–34.0)
MCHC: 32.5 g/dL (ref 30.0–36.0)
MCV: 94.8 fL (ref 80.0–100.0)
Monocytes Absolute: 0.5 10*3/uL (ref 0.1–1.0)
Monocytes Relative: 5 %
Neutro Abs: 7.1 10*3/uL (ref 1.7–7.7)
Neutrophils Relative %: 77 %
Platelets: 321 10*3/uL (ref 150–400)
RBC: 3.86 MIL/uL — ABNORMAL LOW (ref 3.87–5.11)
RDW: 12.1 % (ref 11.5–15.5)
WBC: 9.2 10*3/uL (ref 4.0–10.5)
nRBC: 0 % (ref 0.0–0.2)

## 2017-12-22 MED ORDER — LORAZEPAM 1 MG PO TABS: 1.0000 mg | ORAL_TABLET | Freq: Once | ORAL | Status: DC

## 2017-12-22 MED ORDER — LORAZEPAM 2 MG/ML IJ SOLN
1.0000 mg | Freq: Once | INTRAMUSCULAR | Status: AC
  Administered 2017-12-22: 1 mg via INTRAVENOUS
  Filled 2017-12-22: qty 1

## 2017-12-22 NOTE — BH Specialist Note (Signed)
Integrated Behavioral Health Initial Visit  MRN: [id number] Name: [name]  Number of [facility] Clinician visits:: 1/6 Session Start time: 11:41  Session End time: 12:31 Total time: 50 minutes  Type of Service: Integrated Behavioral Health- Individual/Family Interpretor:No.        SUBJECTIVE: [name] is a 67 y.o. female accompanied by son. Patient was referred by Dr.KIm for depression and grief. Patient reports the following symptoms/concerns: Difficulty adjusting to the loss of her daughter.  Duration of problem: six months; Severity of problem: mild  OBJECTIVE: Mood: Depressed and Affect: Tearful Risk of harm to self or others: No plan to harm self or others  LIFE CONTEXT: Family and Social: Patient lives with her son. Patient lost her daughter and four other family members this year. Patient's daughter was a support for the patient, and patient is having difficulty with the loss of her daughter.  Self-Care: Patient has her son to assist with daily care needs.  Life Changes: Patient has lost four family members since June of this year.   GOALS ADDRESSED: Patient will: 1. Reduce symptoms of: sadness and grief related to recent losses.  2. Increase knowledge and/or ability of: coping skills and increasing natural supports.   3. Demonstrate ability to: Increase healthy adjustment to current life circumstances, Increase adequate support systems for patient/family and Begin healthy grieving over loss  INTERVENTIONS: Interventions utilized: Brief CBT and Supportive Counseling. Therapist supported the patient as she processed losses in her family, and how that has negatively impacted her life. Grief counseling was used to explore the patient's feelings towards the loss of her daughter.  Standardized Assessments completed: Not Needed  ASSESSMENT: Patient currently experiencing periods of sadness and hopelessness related to the loss of her daughter and  other family members.  Patient has chronic health issues that also cause emotional challenges for her.    Patient may benefit from grief counseling.  PLAN: 1. Follow up with behavioral health clinician on : as needed.  2. Referral(s): [facility] (In Clinic)   [place], [place], [place]

## 2017-12-22 NOTE — ED Triage Notes (Signed)
Pt to ED for near syncopal episode with hx of the same. Pt lost a daughter a few months ago and since then has had multiple anxiety attacks/near syncopal episodes. Pt hyperventilating and nauseas.

## 2017-12-22 NOTE — ED Provider Notes (Signed)
[facility] EMERGENCY DEPARTMENT Provider Note   CSN: [id number] Arrival date & time: 12/22/17  1308   History is obtained from patient from patient's daughter-in-law who accompanies her and from EMS  History   Chief Complaint Chief Complaint  Patient presents with  . Anxiety    HPI [name] is a 67 y.o. female.  She developed shortness of breath, feeling of anxiety nausea and vomited one time onset approximately 12 noon today.  She is had multiple similar episodes in the past starting when her child was first diagnosed with brain tumor.  Her child went on to die.  Treatment prior to coming no other associated symptoms  HPI  Past Medical History:  Diagnosis Date  . Arthritis   . Borderline glaucoma of both eyes   . Depression   . Diabetic polyneuropathy ([place])   . Diverticulosis of colon   . Dry eyes, bilateral   . Feeling of incomplete bladder emptying   . History of breast cancer oncologist-  dr Waymon Budge-- per lov note no recurrence   dx 04/ 1999 --- Stage 3B-- s/p  chemotherapy then right mastectomy then concurrent chemoradiation therapy  . History of urinary retention    01/ 2018  . Hydronephrosis of right kidney   . Hyperlipidemia   . Hypertension   . Hypothyroidism   . Insulin dependent type 2 diabetes mellitus [facility])    endocrinologist-  dr Cruzita Lederer---  last A1c 8.7 on 02-20-2016  . Lymphedema of upper extremity    right  . Macular degeneration, left eye    followed by dr Zigmund Daniel  . Renal insufficiency   . Urgency of urination     Patient Active Problem List   Diagnosis Date Noted  . Dizziness 12/05/2017  . Hydronephrosis 04/01/2017  . Sciatica of left side without back pain 01/30/2017  . Neuropathy 12/09/2016  . Hypokalemia 12/09/2016  . Hypomagnesemia 12/09/2016  . Genetic testing 11/27/2015  . Bilateral hip pain 02/24/2015  . Preventative health care 08/26/2012  . HTN, goal below 140/90 08/13/2012  . CKD (chronic kidney  disease) stage 3, GFR 30-59 ml/min (HCC) 08/13/2012  . Glaucoma associated with systemic syndromes(365.44) 08/07/2012  . Hypothyroidism 04/16/2006  . Anemia 04/16/2006  . Depression 04/16/2006  . LOW BACK PAIN 04/16/2006  . BREAST CANCER, HX OF 04/16/2006  . Hyperlipidemia 10/13/2003  . Type 2 diabetes mellitus with diabetic polyneuropathy, with long-term current use of insulin ([place]) 04/16/1991    Past Surgical History:  Procedure Laterality Date  . CARPAL TUNNEL RELEASE Right 2017   and tendon repair  . CATARACT EXTRACTION W/ INTRAOCULAR LENS  IMPLANT, BILATERAL  2017  . CYSTO/  RIGHT RETROGRADE PYELOGRAM/  UNROOFING RIGHT URETEROCELE  09/18/2000  . CYSTOSCOPY/RETROGRADE/URETEROSCOPY Right 05/09/2016   Procedure: CYSTOSCOPY and right RETROGRADE;  Surgeon: Irine Seal, MD;  Location: [facility];  Service: Urology;  Laterality: Right;  . FINGER SURGERY Left x2 prior to 09-09-2014  . I&D EXTREMITY Left 09/09/2014   Procedure: IRRIGATION AND DEBRIDEMENT LEFT THUMB DISTAL PHALANX;  Surgeon: Daryll Brod, MD;  Location: [facility];  Service: Orthopedics;  Laterality: Left;  [name] MASTECTOMY Right 1999   w/  Node dissection's  . PORT-A-CATH REMOVAL  05/01/1999  . TUBAL LIGATION    . VAGINAL HYSTERECTOMY  1970's  . WRIST GANGLION EXCISION Right 2016     OB History   None      Home Medications    Prior to Admission medications   Medication  Sig Start Date End Date Taking? Authorizing Provider  acetaminophen (MAPAP) 325 MG tablet Mapap (acetaminophen) 325 mg tablet    [provider]  ALPHAGAN P 0.1 % SOLN Place 1 drop into both eyes 3 (three) times daily. 06/09/17   [provider]  amLODipine (NORVASC) 10 MG tablet TAKE 1 TABLET(10 MG) BY MOUTH DAILY Patient not taking: Reported on 11/20/2017 11/13/17   Aldine Contes, MD  aspirin EC 81 MG tablet Take 81 mg by mouth at bedtime.    [provider]  Besifloxacin HCl (BESIVANCE) 0.6  % SUSP Besivance 0.6 % eye drops,suspension  INSTILL 1 DROP INTO THE LEFT EYE QID X 2 DAYS AFTER EACH MONTHLY EYE INJECTION    [provider]  buPROPion (WELLBUTRIN XL) 150 MG 24 hr tablet Take 1 tablet (150 mg total) by mouth daily. Patient not taking: Reported on 11/20/2017 11/19/17   Aldine Contes, MD  calcium citrate-vitamin D (CITRACAL+D) 315-200 MG-UNIT tablet Take 1 tablet by mouth 2 (two) times daily. Patient not taking: Reported on 11/20/2017 06/06/16 12/27/17  Aldine Contes, MD  Cholecalciferol (VITAMIN D3) 2000 units TABS Take 2,000 Units at bedtime by mouth.     [provider]  ciprofloxacin (CILOXAN) 0.3 % ophthalmic ointment Ciloxan 0.3 % eye ointment  APPLY TO LEFT EYE Q 2 H WHILE AWAKE    [provider]  Continuous Blood Gluc Receiver (FREESTYLE LIBRE 14 DAY READER) DEVI 1 each by Does not apply route 4 (four) times daily. 10/14/17   Aldine Contes, MD  Continuous Blood Gluc Sensor (FREESTYLE LIBRE 14 DAY SENSOR) MISC 1 each by Does not apply route 4 (four) times daily. 10/14/17   Aldine Contes, MD  cycloSPORINE (RESTASIS) 0.05 % ophthalmic emulsion Restasis MultiDose 0.05 % eye drops  INT 1 GTT IN [facility] EYE BID    [provider]  erythromycin ophthalmic ointment APPLY A SMALL AMOUNT INTO LEFT EYE TID 09/09/17   [provider]  glucose 4 GM chewable tablet Chew 4 tablets (16 g total) by mouth as needed for low blood sugar. 05/28/12   Dominic Pea, DO  glucose blood (ONETOUCH VERIO) test strip CHECK BLOOD SUGAR before meals and bedtime 10/14/17   Aldine Contes, MD  glucose blood test strip OneTouch Verio strips  USE TO CHECK BS BID    [provider]  hydrochlorothiazide (MICROZIDE) 12.5 MG capsule TAKE 1 CAPSULE BY MOUTH EVERY DAY 11/13/17   Aldine Contes, MD  ibuprofen (ADVIL,MOTRIN) 400 MG tablet ibuprofen 400 mg tablet    [provider]  insulin degludec (TRESIBA FLEXTOUCH) 100 UNIT/ML SOPN  FlexTouch Pen Inject 0.18 mLs (18 Units total) into the skin daily. 07/29/17   Aldine Contes, MD  insulin lispro (HUMALOG KWIKPEN) 100 UNIT/ML KiwkPen Inject 0.05-0.07 mLs (5-7 Units total) into the skin 3 (three) times daily. 11/18/17   Philemon Kingdom, MD  Insulin Pen Needle (BD PEN NEEDLE NANO U/F) 32G X 4 MM MISC USE AS DIRECTED 09/10/17   Aldine Contes, MD  ketorolac (ACULAR) 0.5 % ophthalmic solution Place 1 drop into both eyes 3 (three) times daily. 06/11/17   [provider]  levothyroxine (SYNTHROID, LEVOTHROID) 88 MCG tablet Take 88 mcg daily, at least 30 minutes before breakfast. Patient taking differently: 75 mcg. Take 88 mcg daily, at least 30 minutes before breakfast. 11/19/17   Philemon Kingdom, MD  magnesium oxide (MAG-OX) 400 (241.3 Mg) MG tablet magnesium oxide 400 mg (241.3 mg magnesium) tablet  TK 1 T PO BID  [provider]  meclizine (ANTIVERT) 12.5 MG tablet Take 1 tablet (12.5 mg total) by mouth 3 (three) times daily as needed for dizziness. 11/29/17   Fawze, Mina A, PA-C  metFORMIN (GLUCOPHAGE-XR) 500 MG 24 hr tablet Take 2 tablets (1,000 mg total) by mouth 2 (two) times daily with a meal. 11/13/17   Aldine Contes, MD  Nutritional Supplements (GLUCERNA SNACK SHAKE) LIQD Take 1 Dose by mouth 3 (three) times daily as needed. 08/06/17   Aldine Contes, MD  Olopatadine HCl (PATADAY) 0.2 % SOLN Place 1 drop into both eyes daily as needed (dry eyes).     [provider]  Jonetta Speak LANCETS FINE MISC USE TO CHECK BLOOD SUGAR before meals and bedtime 10/14/17   Aldine Contes, MD  Polyethyl Glycol-Propyl Glycol (SYSTANE) 0.4-0.3 % GEL ophthalmic gel Place 1 application daily as needed into both eyes (dry eyes/irritation).     [provider]  potassium chloride 20 MEQ/15ML (10%) SOLN potassium chloride 20 mEq/15 mL oral liquid  TK 15 MLS PO D PRN FOR MUSCLE CRAMPS    [provider]  prednisoLONE acetate (PRED FORTE) 1  % ophthalmic suspension  09/23/17   [provider]  pregabalin (LYRICA) 50 MG capsule Take 50 mg by mouth 3 (three) times daily.    [provider]  rosuvastatin (CRESTOR) 20 MG tablet TAKE 1 TABLET(20 MG) BY MOUTH DAILY 11/13/17   Aldine Contes, MD  Semaglutide,0.25 or 0.5MG /DOS, (OZEMPIC, 0.25 OR 0.5 MG/DOSE,) 2 MG/1.5ML SOPN Inject 0.5 mg into the skin once a week. 11/18/17   Philemon Kingdom, MD  traZODone (DESYREL) 50 MG tablet Take 50 mg by mouth at bedtime as needed for sleep.    [provider]  UNABLE TO FIND bacitracin-polymyxin B 500 unit-10,000 unit/gram eye ointment    [provider]  UNABLE TO FIND BD Ultra-Fine Nano Pen Needle 32 gauge x 5/32"  USE AS DIRECTED    [provider]  UNABLE TO FIND OneTouch Delica Lancets 30 gauge  USE TO CHECK BLOOD SUGAR BID    [provider]  valACYclovir (VALTREX) 500 MG tablet TK 1 T PO QD 09/17/17   [provider]  venlafaxine XR (EFFEXOR-XR) 150 MG 24 hr capsule TAKE 1 CAPSULE BY MOUTH EVERY DAY WITH BREAKFAST 11/13/17   Aldine Contes, MD    Family History Family History  Problem Relation Age of Onset  . Heart disease Father   . Diabetes Father   . Stroke Sister   . Anuerysm Sister 60       brain; maternal half-sister  . Heart disease Brother   . Anuerysm Brother 62       aortic; maternal half-brother  . Breast cancer Daughter 25       negative genetic testing in 2015  . Heart attack Daughter        57-47  . Diabetes Paternal Grandmother   . Stroke Paternal Grandfather   . Anuerysm Brother        NOS type; full brother  . Fibroids Daughter        s/p hysterectomy at 64y  . Brain cancer Maternal Uncle        dx. older than 48; NOS type  . Brain cancer Cousin        maternal 1st cousin; d. early 12s; NOS type  . Deafness Paternal Uncle        prelingual    Social History Social History   Tobacco Use  . Smoking  status: Never Smoker  . Smokeless  tobacco: Never Used  Substance Use Topics  . Alcohol use: No    Alcohol/week: 0.0 standard drinks  . Drug use: No     Allergies   Gabapentin; Penicillins; and Sulfa antibiotics   Review of Systems Review of Systems  Constitutional: Negative.   HENT: Negative.   Respiratory: Positive for shortness of breath.   Cardiovascular: Negative.   Gastrointestinal: Positive for nausea and vomiting.  Musculoskeletal: Positive for gait problem.       Walks with cane  Skin: Negative.   Psychiatric/Behavioral: The patient is nervous/anxious.   All other systems reviewed and are negative.    Physical Exam Updated Vital Signs BP (!) 155/65 (BP Location: Left Wrist)   Pulse (!) 108   Resp 16   SpO2 100%   Physical Exam  Constitutional: She is oriented to person, place, and time. She appears well-developed and well-nourished. She appears distressed.  Anxious appearing  HENT:  Head: Normocephalic and atraumatic.  Eyes: Pupils are equal, round, and reactive to light. Conjunctivae are normal.  Neck: Neck supple. No tracheal deviation present. No thyromegaly present.  Cardiovascular: Regular rhythm.  No murmur heard. Mildly tachycardic  Pulmonary/Chest: Effort normal and breath sounds normal.  Abdominal: Soft. Bowel sounds are normal. She exhibits no distension. There is no tenderness.  Musculoskeletal: Normal range of motion. She exhibits no edema or tenderness.  Neurological: She is alert and oriented to person, place, and time. Coordination normal.  Skin: Skin is warm and dry. No rash noted.  Psychiatric:  Anxious  Nursing note and vitals reviewed.    ED Treatments / Results  Labs (all labs ordered are listed, but only abnormal results are displayed) Labs Reviewed  COMPREHENSIVE METABOLIC PANEL  CBC WITH DIFFERENTIAL/PLATELET  I-STAT TROPONIN, ED    EKG EKG Interpretation  Date/Time:  Monday December 22 2017 13:18:04 EST Ventricular Rate:  109 PR Interval:  158 QRS  Duration: 84 QT Interval:  378 QTC Calculation: 509 R Axis:   -34 Text Interpretation:  Sinus tachycardia Left axis deviation Abnormal ECG No significant change since last tracing Confirmed by Orlie Dakin [phone]) on 12/22/2017 1:30:21 PM   Radiology No results found.  Procedures Procedures (including critical care time)  Medications Ordered in ED Medications  LORazepam (ATIVAN) tablet 1 mg (has no administration in time range)     Initial Impression / Assessment and Plan / ED Course  I have reviewed the triage vital signs and the nursing notes.  Pertinent labs & imaging results that were available during my care of the patient were reviewed by me and considered in my medical decision making (see chart for details).     1:45 PM feels improved after treatment with Ativan.  Appears alert and in no distress.  [facility] Internal medicine resident physician service consulted and will see patient in the ED, she had regularly scheduled visit in their clinic scheduled for 1:15 PM today.   Resident physicians briefly saw patient in the emergency department and arrange for follow-up appointment at the [facility] outpatient clinic for Wednesday, 12/24/2017 at 8:45 AM   At 4:05 PM patient is alert and ambulates with a cane without difficulty.  She feels much better after treatment with intravenous Ativan. Lab work unremarkable Results for orders placed or performed during the hospital encounter of 12/22/17  Comprehensive metabolic panel  Result Value Ref Range   Sodium 140 135 - 145 mmol/L   Potassium 4.3 3.5 - 5.1 mmol/L  Chloride 103 98 - 111 mmol/L   CO2 25 22 - 32 mmol/L   Glucose, Bld 111 (H) 70 - 99 mg/dL   BUN 12 8 - 23 mg/dL   Creatinine, Ser 1.06 (H) 0.44 - 1.00 mg/dL   Calcium 10.3 8.9 - 10.3 mg/dL   Total Protein 7.7 6.5 - 8.1 g/dL   Albumin 4.3 3.5 - 5.0 g/dL   AST 32 15 - 41 U/L   ALT 17 0 - 44 U/L   Alkaline Phosphatase 66 38 - 126 U/L   Total Bilirubin 0.5 0.3 -  1.2 mg/dL   GFR calc non Af Amer 53 (L) >60 mL/min   GFR calc Af Amer >60 >60 mL/min   Anion gap 12 5 - 15  CBC with Differential/Platelet  Result Value Ref Range   WBC 9.2 4.0 - 10.5 K/uL   RBC 3.86 (L) 3.87 - 5.11 MIL/uL   Hemoglobin 11.9 (L) 12.0 - 15.0 g/dL   HCT 36.6 36.0 - 46.0 %   MCV 94.8 80.0 - 100.0 fL   MCH 30.8 26.0 - 34.0 pg   MCHC 32.5 30.0 - 36.0 g/dL   RDW 12.1 11.5 - 15.5 %   Platelets 321 150 - 400 K/uL   nRBC 0.0 0.0 - 0.2 %   Neutrophils Relative % 77 %   Neutro Abs 7.1 1.7 - 7.7 K/uL   Lymphocytes Relative 16 %   Lymphs Abs 1.5 0.7 - 4.0 K/uL   Monocytes Relative 5 %   Monocytes Absolute 0.5 0.1 - 1.0 K/uL   Eosinophils Relative 1 %   Eosinophils Absolute 0.1 0.0 - 0.5 K/uL   Basophils Relative 0 %   Basophils Absolute 0.0 0.0 - 0.1 K/uL   Immature Granulocytes 1 %   Abs Immature Granulocytes 0.06 0.00 - 0.07 K/uL  I-stat troponin, ED  Result Value Ref Range   Troponin i, poc 0.00 0.00 - 0.08 ng/mL   Comment 3           Ct Head Wo Contrast  Result Date: 11/29/2017 CLINICAL DATA:  Dizziness for 2 days EXAM: CT HEAD WITHOUT CONTRAST TECHNIQUE: Contiguous axial images were obtained from the base of the skull through the vertex without intravenous contrast. COMPARISON:  06/27/2017 FINDINGS: Brain: No evidence of acute infarction, hemorrhage, hydrocephalus, extra-axial collection or mass lesion/mass effect. Vascular: No hyperdense vessel or unexpected calcification. Skull: Normal. Negative for fracture or focal lesion. Sinuses/Orbits: No acute finding. Other: Scalp lesion is noted just above the right supraorbital ridge. This is stable from the prior exam as well as a prior MRI dating back to 2008 consistent with a benign etiology. IMPRESSION: No acute intracranial abnormality noted. Electronically Signed   By: Inez Catalina M.D.   On: 11/29/2017 15:07   Mr Brain Wo Contrast (neuro Protocol)  Result Date: 11/29/2017 CLINICAL DATA:  Dizziness EXAM: MRI HEAD  WITHOUT CONTRAST TECHNIQUE: Multiplanar, multiecho pulse sequences of the brain and surrounding structures were obtained without intravenous contrast. COMPARISON:  CT head 11/29/2017, MRI 06/27/2017 FINDINGS: Brain: Image quality degraded by motion Negative for acute infarct. Scattered small white matter hyperintensities bilaterally unchanged from the prior study. Negative for hemorrhage or mass. Vascular: Normal arterial flow voids Skull and upper cervical spine: Negative skull. Sinuses/Orbits: Paranasal sinuses clear.  Bilateral cataract surgery Other: Right frontal scalp lesion unchanged from prior studies and stable. IMPRESSION: No acute abnormality. Mild chronic microvascular ischemic change in the white matter Electronically Signed   By: Franchot Gallo M.D.  On: 11/29/2017 20:22   Dg Hip Unilat With Pelvis 2-3 Views Right  Result Date: 11/29/2017 CLINICAL DATA:  Dizziness for 2 days with recent falls, initial encounter EXAM: DG HIP (WITH OR WITHOUT PELVIS) 3V RIGHT COMPARISON:  None. FINDINGS: The pelvic ring is intact. No acute fracture is seen. Degenerative changes of the hip joints are noted. The proximal femur appears within normal limits. No soft tissue abnormality is noted. IMPRESSION: Degenerative change without acute abnormality. Electronically Signed   By: Inez Catalina M.D.   On: 11/29/2017 15:08    Symptoms consistent with panic attack Final Clinical Impressions(s) / ED Diagnoses  Diagnosis panic attack Final diagnoses:  None    ED Discharge Orders    None       Orlie Dakin, MD 12/22/17 [phone]

## 2017-12-22 NOTE — Discharge Instructions (Addendum)
Keep your scheduled appointment at the [facility] outpatient clinic on Wednesday, 12/24/2017 at 8:45 AM

## 2017-12-23 ENCOUNTER — Encounter: Payer: Self-pay | Admitting: Pharmacist

## 2017-12-23 DIAGNOSIS — H5213 Myopia, bilateral: Secondary | ICD-10-CM | POA: Diagnosis not present

## 2017-12-23 NOTE — Progress Notes (Signed)
Patient requested all medications to be transferred to [name]. Medications were recently transferred to [facility], so I did not transfer to [facility]. Patient also has an appointment [facility] tomorrow 12/24/17, at which time medication management can be addressed. Signing off of patient case for now due to challenges with medication adherence, need for further support/care beyond what I can provide.

## 2017-12-24 ENCOUNTER — Encounter: Payer: Self-pay | Admitting: Internal Medicine

## 2017-12-24 ENCOUNTER — Ambulatory Visit (INDEPENDENT_AMBULATORY_CARE_PROVIDER_SITE_OTHER): Payer: Medicare Other | Admitting: Internal Medicine

## 2017-12-24 ENCOUNTER — Other Ambulatory Visit: Payer: Self-pay

## 2017-12-24 ENCOUNTER — Ambulatory Visit: Payer: Medicare Other | Admitting: Licensed Clinical Social Worker

## 2017-12-24 DIAGNOSIS — F419 Anxiety disorder, unspecified: Secondary | ICD-10-CM | POA: Diagnosis not present

## 2017-12-24 DIAGNOSIS — F339 Major depressive disorder, recurrent, unspecified: Secondary | ICD-10-CM

## 2017-12-24 DIAGNOSIS — F329 Major depressive disorder, single episode, unspecified: Secondary | ICD-10-CM

## 2017-12-24 DIAGNOSIS — Z79890 Hormone replacement therapy (postmenopausal): Secondary | ICD-10-CM | POA: Diagnosis not present

## 2017-12-24 DIAGNOSIS — Z79899 Other long term (current) drug therapy: Secondary | ICD-10-CM | POA: Diagnosis not present

## 2017-12-24 DIAGNOSIS — E039 Hypothyroidism, unspecified: Secondary | ICD-10-CM

## 2017-12-24 DIAGNOSIS — F32A Depression, unspecified: Secondary | ICD-10-CM

## 2017-12-24 DIAGNOSIS — F4321 Adjustment disorder with depressed mood: Secondary | ICD-10-CM

## 2017-12-24 MED ORDER — BUSPIRONE HCL 5 MG PO TABS: 5.0000 mg | ORAL_TABLET | Freq: Two times a day (BID) | ORAL | 0 refills | Status: DC

## 2017-12-24 NOTE — Progress Notes (Signed)
CC: follow up of anxiety   HPI:  [name] is a 67 y.o. with PMH as listed below who presents for follow up of anxiety. Please see the assessment and plans for the status of the patient chronic medical problems.   Past Medical History:  Diagnosis Date  . Arthritis   . Borderline glaucoma of both eyes   . Depression   . Diabetic polyneuropathy ([place])   . Diverticulosis of colon   . Dry eyes, bilateral   . Feeling of incomplete bladder emptying   . History of breast cancer oncologist-  dr Waymon Budge-- per lov note no recurrence   dx 04/ 1999 --- Stage 3B-- s/p  chemotherapy then right mastectomy then concurrent chemoradiation therapy  . History of urinary retention    01/ 2018  . Hydronephrosis of right kidney   . Hyperlipidemia   . Hypertension   . Hypothyroidism   . Insulin dependent type 2 diabetes mellitus [facility])    endocrinologist-  dr Cruzita Lederer---  last A1c 8.7 on 02-20-2016  . Lymphedema of upper extremity    right  . Macular degeneration, left eye    followed by dr Zigmund Daniel  . Renal insufficiency   . Urgency of urination    Review of Systems:  Refer to history of present illness and assessment and plans for pertinent review of systems, all others reviewed and negative  Physical Exam:  Vitals:   12/24/17 0850  BP: 129/76  Pulse: 91  Temp: 98.8 F (37.1 C)  TempSrc: Oral  SpO2: 100%  Weight: 124 lb 8 oz (56.5 kg)  Height: 5\' 2"  (1.575 m)   General: no acute distress, not anxious appearing  Cardiac: regular rate and rhythm, no murmur, no peripheral edema  Pulm: lungs clear to auscultation, normal work of breathing   Assessment & Plan:   Adjustment Disorder  Major Depressive Disorder  Patient has long standing depression which has been exacerbated since the loss of her daughter in June of this year. Today she is accompanied by her sister in law who has concern that her depression and anxiety have not been controlled. She has been on venlafaxine for  years and had buproprion xl added on during a hospitalization for HHS around June. She has continued to feel loneliness and reports difficulty coping. She expresses that she is feeling depressed and anxious. She began seeing our in office behavioral health specialist [name] earlier this month and would like to continue seeing her.  Her son and sister in law are her support at home. She has three other childeren and grandchildren.  She also describes palpitations and dizziness. Dr. Cruzita Lederer manages her hypothyroidism, TSH was 9.16 when last checked in 11/18/2017, T4 was 0.91 and Levothyroxine was increased. She has not been taking synthroid in the optimal way, she has been using a pill pack organized by her pharmacy and takes the synthroid with all of her other morning medications. Our office has been working to set her up with a different pharmacy to have the pill packs distributed in a way that the synthroid will be separated.  GAD 7 is 21, PHQ 9 is 22. It seems that the addition of wellbutrin has now been helping to control her symptoms, she is open to transition to a different medication today.  - start buspirone 5 mg BID  - stop Bupropion 150 mg daily  - continue Venlafaxine 150 mg daily and Pregabalin 50 mg daily  - follow up in two weeks  -  follow up with in office behavioral health specialist for continued grief counseling  See Encounters Tab for problem based charting.  Patient discussed with Dr. Evette Doffing

## 2017-12-24 NOTE — Progress Notes (Signed)
Thanks Dr. Maudie Mercury. I will follow up with her and send her to you again if I feel that there is something more we can add. I appreciate all your help with this.

## 2017-12-24 NOTE — Patient Instructions (Addendum)
Thank you for coming to the clinic today. It was a pleasure to see you.   For your anxiety  Stop taking the wellbutrin  Start taking buspirone twice daily  Schedule a follow up appointment with [facility] taking your synthroid in the morning  Call friendly pharmacy and ask for an update on your new pill packs  FOLLOW-UP INSTRUCTIONS When: 2 weeks in the acute care clinic of with Dr. Dareen Piano  For: follow up of your diabetes  What to bring: all of your medication bottles   Please call the internal medicine center clinic if you have any questions or concerns, we may be able to help and keep you from a long and expensive emergency room wait. Our clinic and after hours phone number is [phone], the best time to call is Monday through Friday 9 am to 4 pm but there is always someone available 24/7 if you have an emergency. If you need medication refills please notify your pharmacy one week in advance and they will send [place] a request.

## 2017-12-24 NOTE — Assessment & Plan Note (Signed)
Patient has long standing depression which has been exacerbated since the loss of her daughter in June of this year. Today she is accompanied by her sister in law who has concern that her depression and anxiety have not been controlled. She has been on venlafaxine for years and had buproprion xl added on during a hospitalization for HHS around June. She has continued to feel loneliness and reports difficulty coping. She expresses that she is feeling depressed and anxious. She began seeing our in office behavioral health specialist Dessie Coma earlier this month and would like to continue seeing her.  Her son and sister in law are her support at home. She has three other childeren and grandchildren.  She also describes palpitations and dizziness. Dr. Cruzita Lederer manages her hypothyroidism, TSH was 9.16 when last checked in 11/18/2017, T4 was 0.91 and Levothyroxine was increased. She has not been taking synthroid in the optimal way, she has been using a pill pack organized by her pharmacy and takes the synthroid with all of her other morning medications. Our office has been working to set her up with a different pharmacy to have the pill packs distributed in a way that the synthroid will be separated.  GAD 7 is 21, PHQ 9 is 22. It seems that the addition of wellbutrin has now been helping to control her symptoms, she is open to transition to a different medication today.  - start buspirone 5 mg BID  - stop Bupropion 150 mg daily  - continue Venlafaxine 150 mg daily and Pregabalin 50 mg daily  - follow up in two weeks  - follow up with in office behavioral health specialist for continued grief counseling

## 2017-12-24 NOTE — Progress Notes (Signed)
Integrated Behavioral Health Follow Up Visit  MRN: [id number] Name: [name]  Number of [place] Clinician visits: 2/6 Session Start time: 10:20  Session End time: 11:02 Total time: 42 minutes  Type of Service: Integrated Behavioral Health- Individual/Family Interpretor:No.    SUBJECTIVE: [name] is a 67 y.o. female accompanied by sister-in-law Patient was referred by Dr. Maudie Mercury for depression and grief counseling. Patient reports the following symptoms/concerns: difficulty adjusting to the loss of her daughter. Anxiety related to her current children and their stability. Chronic health issues.  Duration of problem: six months; Severity of problem: mild  OBJECTIVE: Mood: Depressed and Affect: Tearful Risk of harm to self or others: No plan to harm self or others  LIFE CONTEXT: Family and Social: Patient continues to live with her son. Patient reported issues with her house being overly cluttered, and has a desire to remove some of the objects from her home. Patient is continuing to grieve the loss of her daughter. Patient's sister-in-law reported that she feels the patient is ignoring her other children, and needs be grateful for her existing children. Patient's sister-in-law also has concerns that the patient is easily manipulated by family members. Self-Care: Patient is trying to improve her self-care. Patient is regaining weight that she had lost.  Life Changes: Patient has lost four family members since June.   GOALS ADDRESSED: Patient will: 1.  Reduce symptoms of: anxiety and depression  2.  Increase knowledge and/or ability of: coping skills, stress reduction and grieving of the loss of her daughter.   3.  Demonstrate ability to: Increase healthy adjustment to current life circumstances and Begin healthy grieving over loss  INTERVENTIONS: Interventions utilized:  Brief CBT and Supportive Counseling. MI was used to explore the patient's purpose for  living, and to identify how to follow through on her purpose.  Standardized Assessments completed: Not Needed  ASSESSMENT: Patient currently experiencing periods of sadness related to the loss of her daughter. Patient reports worrying about her adult children. Patient needs to continue to work on improving her self-care and paying attention to her own health care needs.    Patient may benefit from bi-weekly outpatient therapy.  PLAN: 1. Follow up with behavioral health clinician on : two weeks.   Dessie Coma, LPC, LCAS

## 2017-12-24 NOTE — Progress Notes (Signed)
Internal Medicine Clinic Attending  Case discussed with Dr. Blum at the time of the visit.  We reviewed the resident's history and exam and pertinent patient test results.  I agree with the assessment, diagnosis, and plan of care documented in the resident's note. 

## 2017-12-29 ENCOUNTER — Telehealth: Payer: Self-pay | Admitting: Internal Medicine

## 2017-12-29 ENCOUNTER — Ambulatory Visit (INDEPENDENT_AMBULATORY_CARE_PROVIDER_SITE_OTHER): Payer: Medicare Other | Admitting: Orthotics

## 2017-12-29 DIAGNOSIS — E1149 Type 2 diabetes mellitus with other diabetic neurological complication: Secondary | ICD-10-CM | POA: Diagnosis not present

## 2017-12-29 DIAGNOSIS — M2041 Other hammer toe(s) (acquired), right foot: Secondary | ICD-10-CM

## 2017-12-29 DIAGNOSIS — B351 Tinea unguium: Secondary | ICD-10-CM

## 2017-12-29 DIAGNOSIS — M2042 Other hammer toe(s) (acquired), left foot: Secondary | ICD-10-CM

## 2017-12-29 DIAGNOSIS — M2010 Hallux valgus (acquired), unspecified foot: Secondary | ICD-10-CM

## 2017-12-29 NOTE — Telephone Encounter (Signed)
Good morning Triage, I am hoping that you hear a call back from [name] today. She is taking her medications through pill packs which do not have her synthroid separated from the rest of her morning medications. She did not have her medications with her during her [place] visit but we need to figure out which pill in the pack is the synthroid and ask her to start taking that one one hour prior to eating and taking the rest of her morning medications.  Her phone is going straight to voicemail so if she doesn't call back today I will need help trying to reach her tomorrow (12/3). Thank you!

## 2017-12-29 NOTE — Telephone Encounter (Signed)
Thank you. I agree with the referral.

## 2017-12-29 NOTE — Telephone Encounter (Signed)
Thank you much Bonnita Nasuti. That sounds great, I have placed a referral to [facility].

## 2017-12-29 NOTE — Telephone Encounter (Signed)
Hi, spoke w/ pt and dr Maudie Mercury for appt to assist w/ meds, dr Maudie Mercury states pt needs more hands on assist, could drblum or dr Dareen Piano do a referral to [facility], [facility] or [facility]?

## 2017-12-29 NOTE — Progress Notes (Signed)

## 2017-12-30 ENCOUNTER — Encounter: Payer: Self-pay | Admitting: Licensed Clinical Social Worker

## 2017-12-30 ENCOUNTER — Other Ambulatory Visit: Payer: Self-pay

## 2017-12-30 NOTE — Patient Outreach (Signed)
[place] [facility]) Care Management  12/30/2017  [name] Dec 08, 1950 [id number]   TELEPHONE SCREENING Referral date: 12/29/17 Referral source: MD office Referral reason: Assistance for medication administration, Diabetes Insurance:   Telephone screening call to patient regarding referral from MD office for assistance for medication administration and has diabetes No answer and HIPAA compliant message left. Plan to send unsuccessful letter if call not returned Plan to schedule 2nd call attempt in 3-4 business days  [place], [facility], [place] Management Coordinator [facility] Care Management [phone]

## 2018-01-01 ENCOUNTER — Other Ambulatory Visit: Payer: Self-pay

## 2018-01-01 NOTE — Patient Outreach (Signed)
[place] [facility]) Care Management  01/01/2018  [name] 04-26-1950 [id number]   TELEPHONE SCREENING Referral date: 12/29/17 Referral source: MD office Referral reason: Assistance for medication administration, Diabetes Insurance:   Telephone screening call to patient regarding referral from MD office for assistance for medication administration and has diabetes No answer and HIPAA compliant message left. Plan to schedule 3nd call attempt in 3-4 business daysif call not returned.  Peter Garter RN, Jackquline Denmark, CDE Care Management Coordinator [facility] Care Management [phone]

## 2018-01-06 ENCOUNTER — Other Ambulatory Visit: Payer: Self-pay

## 2018-01-06 NOTE — Patient Outreach (Signed)
[place] [facility]) Care Management  01/06/2018  [name] 04/21/1950 [id number]   TELEPHONE SCREENING Referral date:12/29/17 Referral source:MD office Referral reason:Assistance for medication administration, Diabetes Insurance:   Telephonescreeningcall to patient regarding referral from MD office for assistance for medication administration and has diabetes.  Third attempt. No answer and HIPAA compliant message left. Plan to close case if 3nd call attempt is not returned in 3 business days.  Peter Garter RN, Jackquline Denmark, CDE Care Management Coordinator [facility] Care Management [phone]

## 2018-01-08 ENCOUNTER — Ambulatory Visit (INDEPENDENT_AMBULATORY_CARE_PROVIDER_SITE_OTHER): Payer: Medicare Other | Admitting: Internal Medicine

## 2018-01-08 ENCOUNTER — Encounter: Payer: Self-pay | Admitting: Internal Medicine

## 2018-01-08 ENCOUNTER — Encounter: Payer: Self-pay | Admitting: Licensed Clinical Social Worker

## 2018-01-08 ENCOUNTER — Ambulatory Visit: Payer: Medicare Other | Admitting: Licensed Clinical Social Worker

## 2018-01-08 ENCOUNTER — Other Ambulatory Visit: Payer: Self-pay

## 2018-01-08 VITALS — BP 139/65 | HR 101 | Temp 98.7°F | Ht 62.0 in | Wt 125.7 lb

## 2018-01-08 DIAGNOSIS — E1142 Type 2 diabetes mellitus with diabetic polyneuropathy: Secondary | ICD-10-CM

## 2018-01-08 DIAGNOSIS — R296 Repeated falls: Secondary | ICD-10-CM | POA: Diagnosis not present

## 2018-01-08 DIAGNOSIS — F32A Depression, unspecified: Secondary | ICD-10-CM

## 2018-01-08 DIAGNOSIS — F329 Major depressive disorder, single episode, unspecified: Secondary | ICD-10-CM | POA: Diagnosis not present

## 2018-01-08 DIAGNOSIS — Z79899 Other long term (current) drug therapy: Secondary | ICD-10-CM

## 2018-01-08 DIAGNOSIS — F4321 Adjustment disorder with depressed mood: Secondary | ICD-10-CM

## 2018-01-08 DIAGNOSIS — Z794 Long term (current) use of insulin: Secondary | ICD-10-CM | POA: Diagnosis not present

## 2018-01-08 HISTORY — DX: Repeated falls: R29.6

## 2018-01-08 LAB — POCT GLYCOSYLATED HEMOGLOBIN (HGB A1C): Hemoglobin A1C: 9.3 % — AB (ref 4.0–5.6)

## 2018-01-08 LAB — GLUCOSE, CAPILLARY: Glucose-Capillary: 149 mg/dL — ABNORMAL HIGH (ref 70–99)

## 2018-01-08 MED ORDER — INSULIN DEGLUDEC 100 UNIT/ML ~~LOC~~ SOPN: 14.0000 [IU] | PEN_INJECTOR | Freq: Every day | SUBCUTANEOUS | 3 refills | Status: DC

## 2018-01-08 MED ORDER — METFORMIN HCL ER 500 MG PO TB24: 1000.0000 mg | ORAL_TABLET | Freq: Two times a day (BID) | ORAL | 11 refills | Status: DC

## 2018-01-08 NOTE — Assessment & Plan Note (Signed)
Patient with known depression on venlafaxine 150 mg daily, and buspirone 5 mg BID. Her PHQ-9 score has decreased from 22 to 15. She is now following with psychiatry and will continue cognitive behavioral therapy.

## 2018-01-08 NOTE — Patient Instructions (Addendum)
Thank you for allowing [place] to provide your care. Today we are getting you referred for home health physical therapy to work on your strength and hopefully prevent falls and also help with your right leg pain.  You are doing a great job with your diabetes. Keep up the good work and continue your medications as prescribed. We are going to decrease your Tresiba to 14 units daily. If you notice that first thing in the morning you sugars are consistently less than 70 please let us know and we can make further adjustments to your medications.  We will see you back in three months or sooner if anything arises.  Please follow-up with [place] in 1 week.

## 2018-01-08 NOTE — Progress Notes (Signed)
Internal Medicine Clinic Attending  I saw and evaluated the patient.  I personally confirmed the key portions of the history and exam documented by Dr. Helberg and I reviewed pertinent patient test results.  The assessment, diagnosis, and plan were formulated together and I agree with the documentation in the resident's note. 

## 2018-01-08 NOTE — Assessment & Plan Note (Signed)
Patient presented for continued evaluation and management of type II diabetes. Last hemoglobin A1c was greater than 14. She was previously well controlled before several life stressors occurred including the death of her daughter. She is now on Tresiba 18 units QD, Semaglutide 0.5 mg weekly, and Metformin XR 1000 mg BID. She did bring in her records today and I have personally reviewed them.   Report summary is from last 30 days,  Average tests per day: 2 Average blood glucose: 185 Range: minimum: 58 and maximum: 462 Days without test: 0 % in target range: 28 # hypoglycemia: 2 Notes about patterns: Highly labile CBGs prior to 12/01. On 12/01 her graphs have began to be more consistent and with overall downward trend.   Patient states that on 1 December she got a nap on her phone and I'll remind her to take all her medications so she has been consistent with her medications. Prior to this she missed most days of her medications. She is now having episodes of lasting hypoglycemia. Her A1c has decreased to 9.3. We will decrease her Tresiba to 14 units; however, she will likely need further decrease. She will continue her metformin in her semaglutide as prescribed. He will follow-up in January with her primary care provider.

## 2018-01-08 NOTE — Assessment & Plan Note (Signed)
Patient with recurrent falls and uses a cane. She is very weak in high risk for falls. I have placed a referral for home health physical therapy to help her with strength and gait training.

## 2018-01-08 NOTE — BH Specialist Note (Signed)
Integrated Behavioral Health Follow Up Visit  MRN: [id number] Name: [name]  Number of [place] Clinician visits: 3/6 Session Start time: 11:20  Session End time: 12:10 Total time: 50 minutes  Type of Service: Integrated Behavioral Health- Individual/Family Interpretor:No.    SUBJECTIVE: [name] is a 67 y.o. female accompanied by sister in law [name]).  Patient was referred by Dr. Maudie Mercury for grief counseling. Patient reports the following symptoms/concerns: improved health and self-care since the previous session, ongoing issues related to grief, and anxiety related to her current children's life choices.  Duration of problem: six months; Severity of problem: mild  OBJECTIVE: Mood: Depressed and Affect: Tearful Risk of harm to self or others: No plan to harm self or others  LIFE CONTEXT: Family and Social: Patient processed feelings related to the loss of her daughter, and reported that the holidays has triggered periods of sadness. Patient's other children are not as involved in the patient's life, and the patient struggles to share how that makes her feel with them. Patient's  sister-in-law is concerned about how uninvolved the patient's children are with the patient's care.  Self-Care: Patient has improved her adherence to her health care plan since the last session. Patient reported that her A1C levels decreased since the last session, and she is continuing to reach a healthy weight. Patient wants to de-clutter her home, and plans to establish a plan with her live-in son to make this happen.  Life Changes: Adjusting to the loss of her daughter.   GOALS ADDRESSED: Patient will: 1.  Reduce symptoms of: anxiety, depression and grief.  2.  Increase knowledge and/or ability of: healthy habits and cope with the loss of her daughter.   3.  Demonstrate ability to: Increase healthy adjustment to current life circumstances, Increase adequate support systems for  patient/family, Increase motivation to adhere to plan of care and Begin healthy grieving over loss  INTERVENTIONS: Interventions utilized:  Motivational Interviewing, Solution-Focused Strategies and Supportive Counseling. Solution-focused was used to identify how to de-clutter the patient's home, and set realistic time lines for her family to assist in this goal. Grief therapy was used to process the patient's loss of her daughter, and how to move forward.  Standardized Assessments completed: assessed for SI, HI, and self-harm.   ASSESSMENT: Patient currently experiencing ongoing issues related to grief. Patient has difficulty communicating her needs to her adult children, and wants to begin working on communicating her health needs to them. Patient continues to worry about her children's life choices, and acknowledged that she can not control their choices.  Patient has improved her self-awareness since the last session, and ability to identify goals for the future.   Patient may benefit from bi-weekly outpatient therapy.  PLAN: 1. Follow up with behavioral health clinician on : two weeks.   Dessie Coma, LPC, LCAS

## 2018-01-08 NOTE — Progress Notes (Signed)
   CC: Diabetes mellitus  HPI:  [name] is a 67 y.o. female who presented to the clinic for continued evaluation and management of her chronic medical illnesses. For a detailed assessment and plan please refer to problem based charting below.   Past Medical History:  Diagnosis Date  . Arthritis   . Borderline glaucoma of both eyes   . Depression   . Diabetic polyneuropathy ([place])   . Diverticulosis of colon   . Dry eyes, bilateral   . Feeling of incomplete bladder emptying   . History of breast cancer oncologist-  dr Waymon Budge-- per lov note no recurrence   dx 04/ 1999 --- Stage 3B-- s/p  chemotherapy then right mastectomy then concurrent chemoradiation therapy  . History of urinary retention    01/ 2018  . Hydronephrosis of right kidney   . Hyperlipidemia   . Hypertension   . Hypothyroidism   . Insulin dependent type 2 diabetes mellitus [facility])    endocrinologist-  dr Cruzita Lederer---  last A1c 8.7 on 02-20-2016  . Lymphedema of upper extremity    right  . Macular degeneration, left eye    followed by dr Zigmund Daniel  . Renal insufficiency   . Urgency of urination    Review of Systems:  12 point ROS preformed. All negative aside from those mentioned in the HPI.  Physical Exam: Vitals:   01/08/18 1033  BP: 139/65  Pulse: (!) 101  Temp: 98.7 F (37.1 C)  TempSrc: Oral  SpO2: 100%  Weight: 125 lb 11.2 oz (57 kg)  Height: 5\' 2"  (1.575 m)   General: Obese female in no acute distress Pulm: Good air movement with no wheezing or crackles  CV: RRR, no murmurs, no rubs  Abdomen: Active bowel sounds, soft, non-distended, no tenderness to palpation   Assessment & Plan:   See Encounters Tab for problem based charting.  Patient discussed with Dr. Dareen Piano

## 2018-01-08 NOTE — Patient Outreach (Signed)
[place] [facility]) Care Management  01/08/2018  Belvia Gotschall 08-May-1950 [id number]   TELEPHONE SCREENING Referral date:12/29/17 Referral source:MD office Referral reason:Assistance for medication administration, Diabetes Insurance: [employer]  No response after 3 telephone calls and outreach letter attempt.  PLAN: RNCM will close patient due to being unable to reach.  RNCM will send closure notification to patient's primary MD   Peter Garter RN, [facility], CDE Care Management Coordinator [facility] Care Management [phone]

## 2018-01-13 DIAGNOSIS — H01021 Squamous blepharitis right upper eyelid: Secondary | ICD-10-CM | POA: Diagnosis not present

## 2018-01-13 DIAGNOSIS — H20012 Primary iridocyclitis, left eye: Secondary | ICD-10-CM | POA: Diagnosis not present

## 2018-01-13 DIAGNOSIS — H16223 Keratoconjunctivitis sicca, not specified as Sjogren's, bilateral: Secondary | ICD-10-CM | POA: Diagnosis not present

## 2018-01-13 DIAGNOSIS — H1712 Central corneal opacity, left eye: Secondary | ICD-10-CM | POA: Diagnosis not present

## 2018-01-13 DIAGNOSIS — H04123 Dry eye syndrome of bilateral lacrimal glands: Secondary | ICD-10-CM | POA: Diagnosis not present

## 2018-01-14 ENCOUNTER — Encounter: Payer: Self-pay | Admitting: *Deleted

## 2018-01-15 ENCOUNTER — Ambulatory Visit: Payer: Medicare Other | Admitting: Dietician

## 2018-01-15 ENCOUNTER — Encounter: Payer: Self-pay | Admitting: Dietician

## 2018-01-15 DIAGNOSIS — E1142 Type 2 diabetes mellitus with diabetic polyneuropathy: Secondary | ICD-10-CM

## 2018-01-15 DIAGNOSIS — Z794 Long term (current) use of insulin: Principal | ICD-10-CM

## 2018-01-15 NOTE — Progress Notes (Signed)
Diabetes Group meeting Documentation:  start time:10:15 AM   end time: [name] attended diabetes education group visit today for 60 minutes. She was accompanied by her daughter [name]. She reports that she has been doing better and is happy her A1C is improved. The program included successes and challenges over the past few weeks. We discussed healthy food choices including making oatmeal using old fashioned oats, banana and cinnamon. Our activity was using a food tracker to help [place] stay on track and eat health over the holidays.   Blood sugar: meter was not downloaded today.   The goal she/he is working on [id number]:KHTXHF her medicine as instructed.  Her goal for the next few weeks is taking care of herself  Plan: attend diabetes group meeting next month Debera Lat, RD 01/15/2018 3:19 PM.

## 2018-02-02 ENCOUNTER — Encounter (INDEPENDENT_AMBULATORY_CARE_PROVIDER_SITE_OTHER): Payer: Medicare Other | Admitting: Ophthalmology

## 2018-02-03 ENCOUNTER — Other Ambulatory Visit: Payer: Self-pay

## 2018-02-03 ENCOUNTER — Encounter: Payer: Self-pay | Admitting: Internal Medicine

## 2018-02-03 ENCOUNTER — Ambulatory Visit: Payer: Self-pay | Admitting: Licensed Clinical Social Worker

## 2018-02-03 ENCOUNTER — Ambulatory Visit (INDEPENDENT_AMBULATORY_CARE_PROVIDER_SITE_OTHER): Payer: Medicare Other | Admitting: Internal Medicine

## 2018-02-03 DIAGNOSIS — E1122 Type 2 diabetes mellitus with diabetic chronic kidney disease: Secondary | ICD-10-CM

## 2018-02-03 DIAGNOSIS — E1142 Type 2 diabetes mellitus with diabetic polyneuropathy: Secondary | ICD-10-CM

## 2018-02-03 DIAGNOSIS — Z853 Personal history of malignant neoplasm of breast: Secondary | ICD-10-CM

## 2018-02-03 DIAGNOSIS — Z79890 Hormone replacement therapy (postmenopausal): Secondary | ICD-10-CM

## 2018-02-03 DIAGNOSIS — Z9181 History of falling: Secondary | ICD-10-CM

## 2018-02-03 DIAGNOSIS — N183 Chronic kidney disease, stage 3 unspecified: Secondary | ICD-10-CM

## 2018-02-03 DIAGNOSIS — I129 Hypertensive chronic kidney disease with stage 1 through stage 4 chronic kidney disease, or unspecified chronic kidney disease: Secondary | ICD-10-CM | POA: Diagnosis not present

## 2018-02-03 DIAGNOSIS — I10 Essential (primary) hypertension: Secondary | ICD-10-CM

## 2018-02-03 DIAGNOSIS — R296 Repeated falls: Secondary | ICD-10-CM

## 2018-02-03 DIAGNOSIS — Z79899 Other long term (current) drug therapy: Secondary | ICD-10-CM

## 2018-02-03 DIAGNOSIS — F32A Depression, unspecified: Secondary | ICD-10-CM

## 2018-02-03 DIAGNOSIS — E039 Hypothyroidism, unspecified: Secondary | ICD-10-CM

## 2018-02-03 DIAGNOSIS — F329 Major depressive disorder, single episode, unspecified: Secondary | ICD-10-CM

## 2018-02-03 DIAGNOSIS — Z794 Long term (current) use of insulin: Secondary | ICD-10-CM

## 2018-02-03 LAB — GLUCOSE, CAPILLARY: Glucose-Capillary: 176 mg/dL — ABNORMAL HIGH (ref 70–99)

## 2018-02-03 NOTE — Progress Notes (Signed)
   Subjective:    Patient ID: [name], female    DOB: 11-26-1950, 68 y.o.   MRN: [id number]  HPI  I have seen and examined this patient.  Patient is here for routine follow-up of her diabetes and hypertension.  Patient denies any new complaints at this time.  She states that she has been compliant with all her medications.  Review of Systems  Constitutional: Negative.   HENT: Negative.   Respiratory: Negative.   Cardiovascular: Negative.   Gastrointestinal: Negative.   Musculoskeletal: Negative.   Neurological: Negative.   Psychiatric/Behavioral: Negative.        Objective:   Physical Exam Constitutional:      Appearance: Normal appearance.  HENT:     Head: Normocephalic and atraumatic.     Mouth/Throat:     Pharynx: Oropharynx is clear. No oropharyngeal exudate.  Neck:     Musculoskeletal: Neck supple.  Cardiovascular:     Rate and Rhythm: Normal rate and regular rhythm.     Heart sounds: Normal heart sounds.  Pulmonary:     Effort: Pulmonary effort is normal.     Breath sounds: Normal breath sounds. No wheezing or rales.  Abdominal:     General: Bowel sounds are normal. There is no distension.     Palpations: Abdomen is soft.     Tenderness: There is no abdominal tenderness.  Musculoskeletal:        General: No swelling.  Lymphadenopathy:     Cervical: No cervical adenopathy.  Neurological:     Mental Status: She is alert and oriented to person, place, and time. Mental status is at baseline.  Psychiatric:        Mood and Affect: Mood normal.        Behavior: Behavior normal.           Assessment & Plan:  Please see problem based charting for assessment and plan:

## 2018-02-03 NOTE — Assessment & Plan Note (Addendum)
Lab Results  Component Value Date   HGBA1C 9.3 (A) 01/08/2018   HGBA1C >14.0 (A) 10/14/2017   HGBA1C 14.0 (A) 07/08/2017     Assessment: Diabetes control:  Fair Progress toward A1C goal:    Comments: Patient states that she is compliant with her metformin 1000 mg twice daily as well as her semaglutide 0.5 mg weekly and her pre-meal Humalog.  Her report summary from the last 30 days shows an average blood glucose reading of 157 with only 1 hypoglycemic reading of 69.  The patient is testing their blood sugar 4 times a day and they are injecting insulin 3 times a day. The patient is making adjustments to their insulin doses based on glucose readings.  Plan: Medications:  continue current medications Home glucose monitoring: Frequency:   Timing:   Instruction/counseling given: reminded to bring blood glucose meter & log to each visit and discussed foot care Educational resources provided:   Self management tools provided:   Other plans: Patient to follow-up with endocrinologist on February 4

## 2018-02-03 NOTE — Assessment & Plan Note (Signed)
-  This problem is chronic and stable -Patient states that she is compliant with her levothyroxine 88 mcg daily -We will recheck her TSH on her follow-up visit

## 2018-02-03 NOTE — Assessment & Plan Note (Signed)
-  Patient's PHQ 9 score is 13 today which is mildly improved from her last score of 15 -Patient was unable to follow-up with our behavioral health clinician Miquel Dunn) today as she was late for her appointment.  She will make a follow-up appointment with her. -We will continue with Effexor XR 150 mg daily and buspirone 5 mg twice daily

## 2018-02-03 NOTE — Patient Instructions (Signed)
-  It was a pleasure seeing you Ms. Wrenn. -Please follow-up with me in March for your repeat A1c -Your blood sugars are doing better and you have not had episodes of low blood sugar.  Continue with your current regimen until you see your endocrinologist -We will check your blood work on her follow-up visit -No further work-up for now -Please follow-up with Miquel Dunn -Please call me if you have any questions

## 2018-02-03 NOTE — Assessment & Plan Note (Addendum)
BP Readings from Last 3 Encounters:  02/03/18 132/79  01/08/18 139/65  12/24/17 129/76    Lab Results  Component Value Date   NA 140 12/22/2017   K 4.3 12/22/2017   CREATININE 1.06 (H) 12/22/2017    Assessment: Blood pressure control:  Well-controlled Progress toward BP goal:   At goal Comments: Patient is compliant with Norvasc 10 mg daily and HCTZ 12.5 mg daily  Plan: Medications:  continue current medications Educational resources provided:   Self management tools provided:   Other plans:

## 2018-02-03 NOTE — Assessment & Plan Note (Signed)
-  This problem is chronic and stable -Patient denies any urinary complaints at this time -We will recheck her BMP on her follow-up visit

## 2018-02-03 NOTE — Assessment & Plan Note (Addendum)
-  Patient does have a history of recurrent falls and uses a cane for ambulation -She would benefit from working with home health physical therapy. She denies any falls over the last month -We will continue to follow her for this and place a home health PT order

## 2018-02-03 NOTE — Assessment & Plan Note (Signed)
-  This problem is chronic and stable -Last mammogram in October showed no evidence of malignancy in her left breast -Patient is following with survivorship clinic and has an appointment with them in July -No further work-up at this time

## 2018-02-04 ENCOUNTER — Ambulatory Visit (INDEPENDENT_AMBULATORY_CARE_PROVIDER_SITE_OTHER): Payer: Medicare Other | Admitting: Podiatry

## 2018-02-04 ENCOUNTER — Encounter: Payer: Self-pay | Admitting: Podiatry

## 2018-02-04 DIAGNOSIS — L84 Corns and callosities: Secondary | ICD-10-CM | POA: Diagnosis not present

## 2018-02-04 DIAGNOSIS — E1149 Type 2 diabetes mellitus with other diabetic neurological complication: Secondary | ICD-10-CM | POA: Diagnosis not present

## 2018-02-04 DIAGNOSIS — B351 Tinea unguium: Secondary | ICD-10-CM | POA: Diagnosis not present

## 2018-02-04 DIAGNOSIS — M79676 Pain in unspecified toe(s): Secondary | ICD-10-CM | POA: Diagnosis not present

## 2018-02-04 NOTE — Progress Notes (Signed)
Patient ID: [name], female   DOB: November 27, 1950, 68 y.o.   MRN: [id number] Complaint:  Visit Type: Patient returns to my office for continued preventative foot care services. Complaint: Patient states" my nails have grown long and thick and become painful to walk and wear shoes" Patient has been diagnosed with DM with no foot complications. The patient presents for preventative foot care services. No changes to ROS.  Painful callus under the ball of left foot.  Podiatric Exam: Vascular: dorsalis pedis and posterior tibial pulses are palpable bilateral. Capillary return is immediate. Temperature gradient is WNL. Skin turgor WNL  Sensorium: Diminished  Semmes Weinstein monofilament test. Normal tactile sensation bilaterally. Nail Exam: Pt has thick disfigured discolored nails with subungual debris noted bilateral entire nail hallux through fifth toenails Ulcer Exam: There is no evidence of ulcer or pre-ulcerative changes or infection. Orthopedic Exam: Muscle tone and strength are WNL. No limitations in general ROM. No crepitus or effusions noted. Foot type and digits show no abnormalities. Bony prominences are unremarkable. Skin: No Porokeratosis. No infection or ulcers.  Heloma durum fifth toes B/L s/p surgery by Little Ishikawa.  symptomatic porokeratosis sub 1B/L Diagnosis:  Onychomycosis, , Pain in right toe, pain in left toes,  Heloma durum  B/L  Porokeratosis Treatment & Plan Procedures and Treatment: Consent by patient was obtained for treatment procedures. The patient understood the discussion of treatment and procedures well. All questions were answered thoroughly reviewed. Debridement of mycotic and hypertrophic toenails, 1 through 5 bilateral and clearing of subungual debris. No ulceration, no infection noted. Debride corn and callus.   Return Visit-Office Procedure: Patient instructed to return to the office for a follow up visit 3 months for continued evaluation and treatment.  [name]  DPM

## 2018-02-05 ENCOUNTER — Encounter (INDEPENDENT_AMBULATORY_CARE_PROVIDER_SITE_OTHER): Payer: Medicare Other | Admitting: Ophthalmology

## 2018-02-05 DIAGNOSIS — E11311 Type 2 diabetes mellitus with unspecified diabetic retinopathy with macular edema: Secondary | ICD-10-CM

## 2018-02-05 DIAGNOSIS — H43813 Vitreous degeneration, bilateral: Secondary | ICD-10-CM

## 2018-02-05 DIAGNOSIS — I10 Essential (primary) hypertension: Secondary | ICD-10-CM | POA: Diagnosis not present

## 2018-02-05 DIAGNOSIS — H35033 Hypertensive retinopathy, bilateral: Secondary | ICD-10-CM | POA: Diagnosis not present

## 2018-02-05 DIAGNOSIS — E113312 Type 2 diabetes mellitus with moderate nonproliferative diabetic retinopathy with macular edema, left eye: Secondary | ICD-10-CM | POA: Diagnosis not present

## 2018-02-05 DIAGNOSIS — E113511 Type 2 diabetes mellitus with proliferative diabetic retinopathy with macular edema, right eye: Secondary | ICD-10-CM | POA: Diagnosis not present

## 2018-02-05 LAB — HM DIABETES EYE EXAM

## 2018-02-06 ENCOUNTER — Encounter: Payer: Self-pay | Admitting: *Deleted

## 2018-02-12 ENCOUNTER — Other Ambulatory Visit: Payer: Self-pay | Admitting: Internal Medicine

## 2018-02-12 ENCOUNTER — Ambulatory Visit: Payer: Medicare Other | Admitting: Dietician

## 2018-02-12 ENCOUNTER — Encounter: Payer: Self-pay | Admitting: Dietician

## 2018-02-12 DIAGNOSIS — E1142 Type 2 diabetes mellitus with diabetic polyneuropathy: Secondary | ICD-10-CM

## 2018-02-12 DIAGNOSIS — F329 Major depressive disorder, single episode, unspecified: Secondary | ICD-10-CM

## 2018-02-12 DIAGNOSIS — F32A Depression, unspecified: Secondary | ICD-10-CM

## 2018-02-12 DIAGNOSIS — Z794 Long term (current) use of insulin: Principal | ICD-10-CM

## 2018-02-12 NOTE — Progress Notes (Signed)
Diabetes Group meeting Documentation:  start time:10:15 AM   end time: [place] attended diabetes education group visit today for 60 minutes.  She is accompanied again this month by her daughter Denman George and a Arts administrator. The program included successes and challenges over the past few weeks. We discussed healthy food choices including  How and why to incorporate more protein, fiber and healthy fats in nuts and seeds. Our activity was making banana pops.  Blood sugar: meter was downloaded today. The average number of times testing over the past 30 days was 1.8, the average is 160,  range is 66-246,  the percentage of time in range is 42, % of time hypo is 3, % of time above target range is 55  Lab Results  Component Value Date   HGBA1C 9.3 (A) 01/08/2018   HGBA1C >14.0 (A) 10/14/2017   HGBA1C 14.0 (A) 07/08/2017   HGBA1C 12.8 (H) 06/28/2017   HGBA1C 13.0 03/14/2017     The goal she/he is working on IR:SWNIO what she is supposed to do to care for herself taking meds, eating right, checking glucose.  Her goal for the next few weeks is keep doing what she has been doing.   Plan: attend diabetes group meeting next month Debera Lat, RD 02/12/2018 2:32 PM.

## 2018-02-12 NOTE — Patient Instructions (Signed)
Good to see you, your daughter and your grandaughter  [name]!  Next group is February 20. You and Denman George are signed up.  I am sorry for you and your Aunt's loss.  Take care,  [name]

## 2018-02-18 ENCOUNTER — Telehealth: Payer: Self-pay | Admitting: Licensed Clinical Social Worker

## 2018-02-18 ENCOUNTER — Telehealth: Payer: Self-pay | Admitting: *Deleted

## 2018-02-18 NOTE — Telephone Encounter (Signed)
Patient was called to offer a future appointment.  Patient did not answer. A voicemail was left for the patient with the office phone number to contact if she would like to schedule an appointment.

## 2018-02-18 NOTE — Telephone Encounter (Signed)
Placed call to patient. She states she has no preference in home health agencies. Informed her to expect call from [facility] to set up [place] call to Sharmon Revere of [facility]. She will take patient but needs new order as one placed on 01/08/2018 is over 30 days. Message sent to PCP. Will need to let Malachy Mood know when new order has been placed. Hubbard Hartshorn, RN, BSN

## 2018-02-19 NOTE — Telephone Encounter (Signed)
Done

## 2018-02-19 NOTE — Addendum Note (Signed)
Addended by: Aldine Contes on: 02/19/2018 01:48 PM   Modules accepted: Orders

## 2018-02-23 NOTE — Telephone Encounter (Signed)
Call placed to Sharmon Revere of [facility] to notify her new [facility] order has been placed. Hubbard Hartshorn, RN, BSN

## 2018-02-24 NOTE — Telephone Encounter (Signed)
Left message for [name] of [facility] requesting return call with [facility] date for [facility] PT. Hubbard Hartshorn, RN, BSN

## 2018-02-24 NOTE — Telephone Encounter (Signed)
Malachy Mood returned call. States she did not get my message with new order in. She will pull order now and [facility] will be 1/29 or 02/26/2018. She will call me back with actual SOC. Hubbard Hartshorn, RN, BSN

## 2018-02-25 ENCOUNTER — Telehealth: Payer: Self-pay

## 2018-02-25 DIAGNOSIS — Z853 Personal history of malignant neoplasm of breast: Secondary | ICD-10-CM | POA: Diagnosis not present

## 2018-02-25 DIAGNOSIS — N183 Chronic kidney disease, stage 3 (moderate): Secondary | ICD-10-CM | POA: Diagnosis not present

## 2018-02-25 DIAGNOSIS — Z9221 Personal history of antineoplastic chemotherapy: Secondary | ICD-10-CM | POA: Diagnosis not present

## 2018-02-25 DIAGNOSIS — N132 Hydronephrosis with renal and ureteral calculous obstruction: Secondary | ICD-10-CM | POA: Diagnosis not present

## 2018-02-25 DIAGNOSIS — Z6822 Body mass index (BMI) 22.0-22.9, adult: Secondary | ICD-10-CM | POA: Diagnosis not present

## 2018-02-25 DIAGNOSIS — E039 Hypothyroidism, unspecified: Secondary | ICD-10-CM | POA: Diagnosis not present

## 2018-02-25 DIAGNOSIS — Z923 Personal history of irradiation: Secondary | ICD-10-CM | POA: Diagnosis not present

## 2018-02-25 DIAGNOSIS — Z9181 History of falling: Secondary | ICD-10-CM | POA: Diagnosis not present

## 2018-02-25 DIAGNOSIS — H353220 Exudative age-related macular degeneration, left eye, stage unspecified: Secondary | ICD-10-CM | POA: Diagnosis not present

## 2018-02-25 DIAGNOSIS — I129 Hypertensive chronic kidney disease with stage 1 through stage 4 chronic kidney disease, or unspecified chronic kidney disease: Secondary | ICD-10-CM | POA: Diagnosis not present

## 2018-02-25 DIAGNOSIS — E1142 Type 2 diabetes mellitus with diabetic polyneuropathy: Secondary | ICD-10-CM | POA: Diagnosis not present

## 2018-02-25 DIAGNOSIS — M1990 Unspecified osteoarthritis, unspecified site: Secondary | ICD-10-CM | POA: Diagnosis not present

## 2018-02-25 DIAGNOSIS — I890 Lymphedema, not elsewhere classified: Secondary | ICD-10-CM | POA: Diagnosis not present

## 2018-02-25 DIAGNOSIS — K5710 Diverticulosis of small intestine without perforation or abscess without bleeding: Secondary | ICD-10-CM | POA: Diagnosis not present

## 2018-02-25 DIAGNOSIS — E1122 Type 2 diabetes mellitus with diabetic chronic kidney disease: Secondary | ICD-10-CM | POA: Diagnosis not present

## 2018-02-25 DIAGNOSIS — H04123 Dry eye syndrome of bilateral lacrimal glands: Secondary | ICD-10-CM | POA: Diagnosis not present

## 2018-02-25 NOTE — Telephone Encounter (Signed)
PT started care today, they state that they are not sure PT will be able to help pt. Pt is a hoarder and only has a pathway thru all rooms, the belongings are piled appr 4 to 5 ft high in all rooms. She has ask family to assist pt in cleaning one room out so PT can work w/ pt but is not sure this will happen. There are also 2 dogs in the home. PT feels that all this combined causes pt to fall frequently. [facility] does not have a csw or this would be requested. PT request VO for  2x week for 3 weeks OT eval and treat HHN eval and treat for medication management and disease management VO was given for all requests Do you agree? Sending to [place] [employer]. Also for possible sessions when pt is in clinic, do you agree w/ this?

## 2018-02-25 NOTE — Telephone Encounter (Signed)
Dionne Milo with [facility] requesting VO for PT, OT and nursing. Please call back.

## 2018-02-25 NOTE — Telephone Encounter (Signed)
I agree. Thank you.

## 2018-02-26 NOTE — Telephone Encounter (Signed)
I called the patient. She is scheduled with me for an office visit on 03/19/18.

## 2018-02-26 NOTE — Telephone Encounter (Signed)
Thank you :)

## 2018-02-27 DIAGNOSIS — Z6822 Body mass index (BMI) 22.0-22.9, adult: Secondary | ICD-10-CM | POA: Diagnosis not present

## 2018-02-27 DIAGNOSIS — E1122 Type 2 diabetes mellitus with diabetic chronic kidney disease: Secondary | ICD-10-CM | POA: Diagnosis not present

## 2018-02-27 DIAGNOSIS — H04123 Dry eye syndrome of bilateral lacrimal glands: Secondary | ICD-10-CM | POA: Diagnosis not present

## 2018-02-27 DIAGNOSIS — N183 Chronic kidney disease, stage 3 (moderate): Secondary | ICD-10-CM | POA: Diagnosis not present

## 2018-02-27 DIAGNOSIS — Z853 Personal history of malignant neoplasm of breast: Secondary | ICD-10-CM | POA: Diagnosis not present

## 2018-02-27 DIAGNOSIS — Z9181 History of falling: Secondary | ICD-10-CM | POA: Diagnosis not present

## 2018-02-27 DIAGNOSIS — H353220 Exudative age-related macular degeneration, left eye, stage unspecified: Secondary | ICD-10-CM | POA: Diagnosis not present

## 2018-02-27 DIAGNOSIS — I129 Hypertensive chronic kidney disease with stage 1 through stage 4 chronic kidney disease, or unspecified chronic kidney disease: Secondary | ICD-10-CM | POA: Diagnosis not present

## 2018-02-27 DIAGNOSIS — M1990 Unspecified osteoarthritis, unspecified site: Secondary | ICD-10-CM | POA: Diagnosis not present

## 2018-02-27 DIAGNOSIS — I890 Lymphedema, not elsewhere classified: Secondary | ICD-10-CM | POA: Diagnosis not present

## 2018-02-27 DIAGNOSIS — K5710 Diverticulosis of small intestine without perforation or abscess without bleeding: Secondary | ICD-10-CM | POA: Diagnosis not present

## 2018-02-27 DIAGNOSIS — Z9221 Personal history of antineoplastic chemotherapy: Secondary | ICD-10-CM | POA: Diagnosis not present

## 2018-02-27 DIAGNOSIS — Z923 Personal history of irradiation: Secondary | ICD-10-CM | POA: Diagnosis not present

## 2018-02-27 DIAGNOSIS — E1142 Type 2 diabetes mellitus with diabetic polyneuropathy: Secondary | ICD-10-CM | POA: Diagnosis not present

## 2018-02-27 DIAGNOSIS — N132 Hydronephrosis with renal and ureteral calculous obstruction: Secondary | ICD-10-CM | POA: Diagnosis not present

## 2018-02-27 DIAGNOSIS — E039 Hypothyroidism, unspecified: Secondary | ICD-10-CM | POA: Diagnosis not present

## 2018-03-02 ENCOUNTER — Telehealth: Payer: Self-pay

## 2018-03-02 NOTE — Telephone Encounter (Signed)
Questions about PCS. Please call pt back.

## 2018-03-03 ENCOUNTER — Encounter: Payer: Self-pay | Admitting: Internal Medicine

## 2018-03-03 ENCOUNTER — Ambulatory Visit (INDEPENDENT_AMBULATORY_CARE_PROVIDER_SITE_OTHER): Payer: Medicare Other | Admitting: Internal Medicine

## 2018-03-03 VITALS — BP 110/60 | HR 102 | Ht 62.0 in | Wt 123.0 lb

## 2018-03-03 DIAGNOSIS — E785 Hyperlipidemia, unspecified: Secondary | ICD-10-CM | POA: Diagnosis not present

## 2018-03-03 DIAGNOSIS — Z9181 History of falling: Secondary | ICD-10-CM | POA: Diagnosis not present

## 2018-03-03 DIAGNOSIS — E1142 Type 2 diabetes mellitus with diabetic polyneuropathy: Secondary | ICD-10-CM

## 2018-03-03 DIAGNOSIS — N183 Chronic kidney disease, stage 3 (moderate): Secondary | ICD-10-CM | POA: Diagnosis not present

## 2018-03-03 DIAGNOSIS — E1122 Type 2 diabetes mellitus with diabetic chronic kidney disease: Secondary | ICD-10-CM | POA: Diagnosis not present

## 2018-03-03 DIAGNOSIS — H04123 Dry eye syndrome of bilateral lacrimal glands: Secondary | ICD-10-CM | POA: Diagnosis not present

## 2018-03-03 DIAGNOSIS — I890 Lymphedema, not elsewhere classified: Secondary | ICD-10-CM | POA: Diagnosis not present

## 2018-03-03 DIAGNOSIS — K5710 Diverticulosis of small intestine without perforation or abscess without bleeding: Secondary | ICD-10-CM | POA: Diagnosis not present

## 2018-03-03 DIAGNOSIS — E039 Hypothyroidism, unspecified: Secondary | ICD-10-CM | POA: Diagnosis not present

## 2018-03-03 DIAGNOSIS — N132 Hydronephrosis with renal and ureteral calculous obstruction: Secondary | ICD-10-CM | POA: Diagnosis not present

## 2018-03-03 DIAGNOSIS — Z923 Personal history of irradiation: Secondary | ICD-10-CM | POA: Diagnosis not present

## 2018-03-03 DIAGNOSIS — Z794 Long term (current) use of insulin: Secondary | ICD-10-CM | POA: Diagnosis not present

## 2018-03-03 DIAGNOSIS — Z9221 Personal history of antineoplastic chemotherapy: Secondary | ICD-10-CM | POA: Diagnosis not present

## 2018-03-03 DIAGNOSIS — I129 Hypertensive chronic kidney disease with stage 1 through stage 4 chronic kidney disease, or unspecified chronic kidney disease: Secondary | ICD-10-CM | POA: Diagnosis not present

## 2018-03-03 DIAGNOSIS — H353220 Exudative age-related macular degeneration, left eye, stage unspecified: Secondary | ICD-10-CM | POA: Diagnosis not present

## 2018-03-03 DIAGNOSIS — Z6822 Body mass index (BMI) 22.0-22.9, adult: Secondary | ICD-10-CM | POA: Diagnosis not present

## 2018-03-03 DIAGNOSIS — M1990 Unspecified osteoarthritis, unspecified site: Secondary | ICD-10-CM | POA: Diagnosis not present

## 2018-03-03 DIAGNOSIS — Z853 Personal history of malignant neoplasm of breast: Secondary | ICD-10-CM | POA: Diagnosis not present

## 2018-03-03 LAB — POCT GLYCOSYLATED HEMOGLOBIN (HGB A1C): Hemoglobin A1C: 7.8 % — AB (ref 4.0–5.6)

## 2018-03-03 MED ORDER — INSULIN DEGLUDEC 100 UNIT/ML ~~LOC~~ SOPN: 16.0000 [IU] | PEN_INJECTOR | Freq: Every day | SUBCUTANEOUS | 3 refills | Status: DC

## 2018-03-03 NOTE — Patient Instructions (Addendum)
Please continue: - Metformin 1000 mg 2x a day with meals - Tresiba 20 units daily - Humalog 5-7 units before meals  - Ozempic 0.5 mg weekly  Please stop at the lab.  Please continue Levothyroxine 88  mcg daily.  Take the thyroid hormone every day, with water, at least 30 minutes before breakfast, separated by at least 4 hours from: - acid reflux medications - calcium - iron - multivitamins  Please return in 3 months with your sugar log.

## 2018-03-03 NOTE — Progress Notes (Signed)
Patient ID: [name], female   DOB: [birth year], 68 y.o.   MRN: [id number]   HPI: Demoni Parmar is a 68 y.o.-year-old female, returning for follow-up for DM2, dx in 1991-1992, insulin-dependent since 2000s, uncontrolled, with medication noncompliance;  + complications (DR, PN). Last visit 4 months ago.  Patient is on many medications and she had problems keeping up with them.  At last visit, she was taking different doses of diabetic medications than prescribed, and she was also on different medications...  She was recently seen by PCP who decreased the dose of [place] as she was having low blood sugars.  This started after she started to take her medications more consistently after she downloaded an app on her phone that reminded her about medication timing.  At this visit, per her meter download, her sugars are impressively improved!  Last hemoglobin A1c was: Lab Results  Component Value Date   HGBA1C 9.3 (A) 01/08/2018   HGBA1C >14.0 (A) 10/14/2017   HGBA1C 14.0 (A) 07/08/2017   HGBA1C 12.8 (H) 06/28/2017   HGBA1C 13.0 03/14/2017   HGBA1C 11.8 (H) 12/09/2016   HGBA1C >14.0 09/03/2016   HGBA1C 7.7 05/28/2016   HGBA1C 8.7 (H) 02/20/2016   HGBA1C 11.7 12/01/2015   HGBA1C 10.7 08/29/2015   HGBA1C 8.7 06/13/2015   HGBA1C 8.1 02/24/2015   HGBA1C 9.6 10/18/2014   HGBA1C 10.2 05/16/2014   Pt is on a regimen of:  - Metformin 1000 mg 2x a day with meals - Tresiba 16 units daily - Humalog 5-7 units before meals  - Ozempic 0.5 mg weekly  Pt checks her sugars 3x a day-per meter download - am: 70 x1, 213-437 >> 200-300s >> 87-165 - 2h after b'fast:302, 355 >> n/c - before lunch:303 >> 200-300s >> 80-154 - 2h after lunch:  151 >> n/c >> 94, 99 - before dinner500 x1 >> 300, 507 >> n.c  - 2h after dinner:n/c >> 282 >> n/c >> 117-206, 223 - bedtime: 108, 256 >> 220 >> n/c >> 142 - nighttime:   61 x1 >> 80 >> n/c Lowest sugar was 80 >> 70 x1 >> 169 >> 60s before reducing Tresiba dose;  she has hypoglycemia awareness in the 80s. Highest sugar was 500 >> 507 >> 300s >> 223.  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  -+ Mild CKD, last BUN/creatinine:  Lab Results  Component Value Date   BUN 12 12/22/2017   BUN 15 11/29/2017   CREATININE 1.06 (H) 12/22/2017   CREATININE 0.89 11/29/2017   -+ HL; last set of lipids: Lab Results  Component Value Date   CHOL 189 10/14/2017   HDL 59 10/14/2017   LDLCALC 108 (H) 10/14/2017   TRIG 111 10/14/2017   CHOLHDL 3.2 10/14/2017  On Crestor. - last eye exam was in 10/2017: + DR.  She has a history of laser surgery and intraocular injections. -+ Numbness and tingling in her feet.  She also has hypothyroidism.   Last TSH high >> we increase her LT4 dose (unclear why this was decreased) Lab Results  Component Value Date   TSH 9.16 (H) 11/18/2017   TSH 10.190 (H) 10/14/2017   TSH 0.355 (L) 07/22/2017   TSH 6.763 (H) 06/27/2017   TSH 4.700 (H) 04/01/2017   TSH 4.100 12/09/2016   TSH 1.050 05/28/2016   TSH 10.970 (H) 02/27/2016   TSH 1.500 03/14/2015   TSH 2.710 03/24/2014   Pt is on levothyroxine 88 mcg daily, taken: -  in am - fasting - at least 30 min from b'fast - no Ca, Fe, PPIs - + MVI later in the day - not on Biotin  ROS: Constitutional: no weight gain/no weight loss, no fatigue, no subjective hyperthermia, no subjective hypothermia Eyes: no blurry vision, no xerophthalmia ENT: no sore throat, no nodules palpated in neck, no dysphagia, no odynophagia, no hoarseness Cardiovascular: no CP/no SOB/no palpitations/no leg swelling Respiratory: no cough/no SOB/no wheezing Gastrointestinal: no N/no V/no D/no C/no acid reflux Musculoskeletal: no muscle aches/no joint aches Skin: no rashes, no hair loss Neurological: no tremors/+ numbness/+ tingling/no dizziness  I reviewed pt's medications, allergies, PMH, social hx, family hx, and changes were documented in the history of present  illness. Otherwise, unchanged from my initial visit note.  Past Medical History:  Diagnosis Date  . Arthritis   . Borderline glaucoma of both eyes   . Depression   . Diabetic polyneuropathy ([place])   . Diverticulosis of colon   . Dry eyes, bilateral   . Feeling of incomplete bladder emptying   . History of breast cancer oncologist-  dr Waymon Budge-- per lov note no recurrence   dx 04/ 1999 --- Stage 3B-- s/p  chemotherapy then right mastectomy then concurrent chemoradiation therapy  . History of urinary retention    01/ 2018  . Hydronephrosis of right kidney   . Hyperlipidemia   . Hypertension   . Hypothyroidism   . Insulin dependent type 2 diabetes mellitus [facility])    endocrinologist-  dr Cruzita Lederer---  last A1c 8.7 on 02-20-2016  . Lymphedema of upper extremity    right  . Macular degeneration, left eye    followed by dr Zigmund Daniel  . Renal insufficiency   . Urgency of urination    Past Surgical History:  Procedure Laterality Date  . CARPAL TUNNEL RELEASE Right 2017   and tendon repair  . CATARACT EXTRACTION W/ INTRAOCULAR LENS  IMPLANT, BILATERAL  2017  . CYSTO/  RIGHT RETROGRADE PYELOGRAM/  UNROOFING RIGHT URETEROCELE  09/18/2000  . CYSTOSCOPY/RETROGRADE/URETEROSCOPY Right 05/09/2016   Procedure: CYSTOSCOPY and right RETROGRADE;  Surgeon: Irine Seal, MD;  Location: [facility];  Service: Urology;  Laterality: Right;  . FINGER SURGERY Left x2 prior to 09-09-2014  . I&D EXTREMITY Left 09/09/2014   Procedure: IRRIGATION AND DEBRIDEMENT LEFT THUMB DISTAL PHALANX;  Surgeon: Daryll Brod, MD;  Location: [facility];  Service: Orthopedics;  Laterality: Left;  [name] MASTECTOMY Right 1999   w/  Node dissection's  . PORT-A-CATH REMOVAL  05/01/1999  . TUBAL LIGATION    . VAGINAL HYSTERECTOMY  1970's  . WRIST GANGLION EXCISION Right 2016   Social History   Social History  . Marital status: Widow     Spouse name: N/A  . Number of children: 4   Occupational  History  . Retired   Social History Main Topics  . Smoking status: Smoker  . Smokeless tobacco: Never Used  . Alcohol use yes  . Drug use: yes   Current Outpatient Medications on File Prior to Visit  Medication Sig Dispense Refill  . acetaminophen (MAPAP) 325 MG tablet Mapap (acetaminophen) 325 mg tablet    . ALPHAGAN P 0.1 % SOLN Place 1 drop into both eyes 3 (three) times daily.  2  . amLODipine (NORVASC) 10 MG tablet TAKE 1 TABLET(10 MG) BY MOUTH DAILY 90 tablet 3  . aspirin EC 81 MG tablet Take 81 mg by mouth at bedtime.    [name] Besifloxacin HCl (BESIVANCE) 0.6 %  SUSP Besivance 0.6 % eye drops,suspension  INSTILL 1 DROP INTO THE LEFT EYE QID X 2 DAYS AFTER EACH MONTHLY EYE INJECTION    . busPIRone (BUSPAR) 5 MG tablet Take 1 tablet (5 mg total) by mouth 2 (two) times daily. 60 tablet 0  . calcium citrate-vitamin D (CITRACAL+D) 315-200 MG-UNIT tablet Take 1 tablet by mouth 2 (two) times daily. (Patient not taking: Reported on 11/20/2017) 100 tablet 3  . Cholecalciferol (VITAMIN D3) 2000 units TABS Take 2,000 Units at bedtime by mouth.     . ciprofloxacin (CILOXAN) 0.3 % ophthalmic ointment Ciloxan 0.3 % eye ointment  APPLY TO LEFT EYE Q 2 H WHILE AWAKE    . Continuous Blood Gluc Receiver (FREESTYLE LIBRE 14 DAY READER) DEVI 1 each by Does not apply route 4 (four) times daily. 1 Device 0  . Continuous Blood Gluc Sensor (FREESTYLE LIBRE 14 DAY SENSOR) MISC 1 each by Does not apply route 4 (four) times daily. 2 each 12  . cycloSPORINE (RESTASIS) 0.05 % ophthalmic emulsion Restasis MultiDose 0.05 % eye drops  INT 1 GTT IN EACH EYE BID    . erythromycin ophthalmic ointment APPLY A SMALL AMOUNT INTO LEFT EYE TID  1  . glucose 4 GM chewable tablet Chew 4 tablets (16 g total) by mouth as needed for low blood sugar. 50 tablet 12  . glucose blood (ONETOUCH VERIO) test strip CHECK BLOOD SUGAR before meals and bedtime 125 each 12  . glucose blood test strip OneTouch Verio strips  USE TO CHECK BS  BID    . hydrochlorothiazide (MICROZIDE) 12.5 MG capsule TAKE 1 CAPSULE BY MOUTH EVERY DAY 90 capsule 3  . ibuprofen (ADVIL,MOTRIN) 400 MG tablet ibuprofen 400 mg tablet    . insulin degludec (TRESIBA FLEXTOUCH) 100 UNIT/ML SOPN FlexTouch Pen Inject 0.14 mLs (14 Units total) into the skin daily. 15 mL 3  . insulin lispro (HUMALOG KWIKPEN) 100 UNIT/ML KiwkPen Inject 0.05-0.07 mLs (5-7 Units total) into the skin 3 (three) times daily. 15 mL 5  . Insulin Pen Needle (BD PEN NEEDLE NANO U/F) 32G X 4 MM MISC USE AS DIRECTED 100 each 6  . ketorolac (ACULAR) 0.5 % ophthalmic solution Place 1 drop into both eyes 3 (three) times daily.  12  . levothyroxine (SYNTHROID, LEVOTHROID) 88 MCG tablet Take 88 mcg daily, at least 30 minutes before breakfast. (Patient taking differently: 75 mcg. Take 88 mcg daily, at least 30 minutes before breakfast.) 90 tablet 3  . magnesium oxide (MAG-OX) 400 (241.3 Mg) MG tablet magnesium oxide 400 mg (241.3 mg magnesium) tablet  TK 1 T PO BID    . meclizine (ANTIVERT) 12.5 MG tablet Take 1 tablet (12.5 mg total) by mouth 3 (three) times daily as needed for dizziness. 30 tablet 0  . metFORMIN (GLUCOPHAGE-XR) 500 MG 24 hr tablet Take 2 tablets (1,000 mg total) by mouth 2 (two) times daily with a meal. 120 tablet 11  . Nutritional Supplements (GLUCERNA SNACK SHAKE) LIQD Take 1 Dose by mouth 3 (three) times daily as needed. 30 Bottle 3  . Olopatadine HCl (PATADAY) 0.2 % SOLN Place 1 drop into both eyes daily as needed (dry eyes).     Glory Rosebush DELICA LANCETS FINE MISC USE TO CHECK BLOOD SUGAR before meals and bedtime 200 each 7  . Polyethyl Glycol-Propyl Glycol (SYSTANE) 0.4-0.3 % GEL ophthalmic gel Place 1 application daily as needed into both eyes (dry eyes/irritation).     . potassium chloride 20 MEQ/15ML (10%) SOLN potassium  chloride 20 mEq/15 mL oral liquid  TK 15 MLS PO D PRN FOR MUSCLE CRAMPS    . prednisoLONE acetate (PRED FORTE) 1 % ophthalmic suspension     . pregabalin  (LYRICA) 50 MG capsule Take 50 mg by mouth 3 (three) times daily.    . rosuvastatin (CRESTOR) 20 MG tablet TAKE 1 TABLET(20 MG) BY MOUTH DAILY 90 tablet 3  . Semaglutide,0.25 or 0.5MG /DOS, (OZEMPIC, 0.25 OR 0.5 MG/DOSE,) 2 MG/1.5ML SOPN Inject 0.5 mg into the skin once a week. 2 pen 5  . traZODone (DESYREL) 50 MG tablet Take 50 mg by mouth at bedtime as needed for sleep.    [name] UNABLE TO FIND bacitracin-polymyxin B 500 unit-10,000 unit/gram eye ointment    . UNABLE TO FIND BD Ultra-Fine Nano Pen Needle 32 gauge x 5/32"  USE AS DIRECTED    . UNABLE TO FIND OneTouch Delica Lancets 30 gauge  USE TO CHECK BLOOD SUGAR BID    . valACYclovir (VALTREX) 500 MG tablet TK 1 T PO QD  0  . venlafaxine XR (EFFEXOR-XR) 150 MG 24 hr capsule TAKE ONE CAPSULE BY MOUTH DAILY WITH BREAKFAST 90 capsule 1   No current facility-administered medications on file prior to visit.    Allergies  Allergen Reactions  . Gabapentin Other (See Comments)    Dizziness from 300 mg, tolerates 100 mg Dizziness from 300 mg, tolerates 100 mg   . Penicillins Rash    Has patient had a PCN reaction causing immediate rash, facial/tongue/throat swelling, SOB or lightheadedness with hypotension: Yes Has patient had a PCN reaction causing severe rash involving mucus membranes or skin necrosis: No Has patient had a PCN reaction that required hospitalization: pt was in hospital at time of reaction Has patient had a PCN reaction occurring within the last 10 years: No If all of the above answers are "NO", then may proceed with Cephalosporin use.  . Sulfa Antibiotics Rash   Family History  Problem Relation Age of Onset  . Heart disease Father   . Diabetes Father   . Stroke Sister   . Anuerysm Sister 74       brain; maternal half-sister  . Heart disease Brother   . Anuerysm Brother 58       aortic; maternal half-brother  . Breast cancer Daughter 23       negative genetic testing in 2015  . Heart attack Daughter        31-47  .  Diabetes Paternal Grandmother   . Stroke Paternal Grandfather   . Anuerysm Brother        NOS type; full brother  . Fibroids Daughter        s/p hysterectomy at 47y  . Brain cancer Maternal Uncle        dx. older than 29; NOS type  . Brain cancer Cousin        maternal 1st cousin; d. early 33s; NOS type  . Deafness Paternal Uncle        prelingual   PE: BP 110/60   Pulse (!) 102   Ht 5\' 2"  (1.575 m)   Wt 123 lb (55.8 kg)   SpO2 98%   BMI 22.50 kg/m  Wt Readings from Last 3 Encounters:  03/03/18 123 lb (55.8 kg)  02/12/18 123 lb 6.4 oz (56 kg)  02/03/18 124 lb 11.2 oz (56.6 kg)   Constitutional: normal weight, in NAD Eyes: PERRLA, EOMI, no exophthalmos ENT: moist mucous membranes, no thyromegaly, no cervical lymphadenopathy Cardiovascular:  tachycardia, RR, No MRG Respiratory: CTA B Gastrointestinal: abdomen soft, NT, ND, BS+ Musculoskeletal: no deformities, strength intact in all 4 Skin: moist, warm, no rashes Neurological: no tremor with outstretched hands, DTR normal in all 4   ASSESSMENT: 1. DM2, insulin-dependent, uncontrolled, with complications - DR - PN  2. Hypothyroidism  3. HL  PLAN:  1. Patient with longstanding, uncontrolled, type 2 diabetes, on basal insulin, GLP-1 receptor agonist, metformin, and Humalog.  - Most recent HbA1c was much better, at 9.3%.  Previously, undetectably high.  - She was confused at last visit about what she should take and we clarified her regimen.  Since then, per review of the most recent visit note with PCP, she downloaded and app on her phone that helps her remember when to take her medicines and her sugars improved significantly.  She even developed lows so the [place] dose was decreased by PCP. At this visit, she has no lows and sugars are at goal or slightly above!  - sugars are higher afterdinner >> discussed to take the higher Humalog dose then. - I suggested to:  Patient Instructions  Please continue: - Metformin 1000  mg 2x a day with meals - Tresiba 20 units daily - Humalog 5-7 units before meals  - Ozempic 0.5 mg weekly  Please stop at the lab.  Please continue Levothyroxine 88  mcg daily.  Take the thyroid hormone every day, with water, at least 30 minutes before breakfast, separated by at least 4 hours from: - acid reflux medications - calcium - iron - multivitamins  Please return in 3 months with your sugar log.   - today, HbA1c is 7.8% (Excellent improvement!) - continue checking sugars at different times of the day - check 3x a day, rotating checks - advised for yearly eye exams >> she is UTD - Return to clinic in 3 mo with sugar log    2. Hypothyroidism - latest thyroid labs reviewed with pt >> normal  - she continues on LT4 88 mcg daily (increased at last OV) - pt feels good on this dose. - we discussed about taking the thyroid hormone every day, with water, >30 minutes before breakfast, separated by >4 hours from acid reflux medications, calcium, iron, multivitamins. Pt. is taking it correctly. - will check thyroid tests today: TSH and fT4 - If labs are abnormal, she will need to return for repeat TFTs in 1.5 months  3. HL - Reviewed latest lipid panel from 09/2017: LDL above goal Lab Results  Component Value Date   CHOL 189 10/14/2017   HDL 59 10/14/2017   LDLCALC 108 (H) 10/14/2017   TRIG 111 10/14/2017   CHOLHDL 3.2 10/14/2017  - Continues Crestor without side effects.  Component     Latest Ref Rng & Units 03/03/2018          Hemoglobin A1C     4.0 - 5.6 % 7.8 (A)  TSH     0.35 - 4.50 uIU/mL 15.53 (H)  T4,Free(Direct)     0.60 - 1.60 ng/dL 0.55 (L)   Thyroid tests worse.  Will increase the dose of levothyroxine to 100 mcg daily and repeat her tests at next visit.  Philemon Kingdom, MD PhD [facility]

## 2018-03-04 DIAGNOSIS — I129 Hypertensive chronic kidney disease with stage 1 through stage 4 chronic kidney disease, or unspecified chronic kidney disease: Secondary | ICD-10-CM | POA: Diagnosis not present

## 2018-03-04 DIAGNOSIS — H04123 Dry eye syndrome of bilateral lacrimal glands: Secondary | ICD-10-CM | POA: Diagnosis not present

## 2018-03-04 DIAGNOSIS — M1990 Unspecified osteoarthritis, unspecified site: Secondary | ICD-10-CM | POA: Diagnosis not present

## 2018-03-04 DIAGNOSIS — N132 Hydronephrosis with renal and ureteral calculous obstruction: Secondary | ICD-10-CM | POA: Diagnosis not present

## 2018-03-04 DIAGNOSIS — E039 Hypothyroidism, unspecified: Secondary | ICD-10-CM | POA: Diagnosis not present

## 2018-03-04 DIAGNOSIS — E1142 Type 2 diabetes mellitus with diabetic polyneuropathy: Secondary | ICD-10-CM | POA: Diagnosis not present

## 2018-03-04 DIAGNOSIS — N183 Chronic kidney disease, stage 3 (moderate): Secondary | ICD-10-CM | POA: Diagnosis not present

## 2018-03-04 DIAGNOSIS — H353220 Exudative age-related macular degeneration, left eye, stage unspecified: Secondary | ICD-10-CM | POA: Diagnosis not present

## 2018-03-04 DIAGNOSIS — Z9221 Personal history of antineoplastic chemotherapy: Secondary | ICD-10-CM | POA: Diagnosis not present

## 2018-03-04 DIAGNOSIS — Z9181 History of falling: Secondary | ICD-10-CM | POA: Diagnosis not present

## 2018-03-04 DIAGNOSIS — Z923 Personal history of irradiation: Secondary | ICD-10-CM | POA: Diagnosis not present

## 2018-03-04 DIAGNOSIS — Z853 Personal history of malignant neoplasm of breast: Secondary | ICD-10-CM | POA: Diagnosis not present

## 2018-03-04 DIAGNOSIS — I890 Lymphedema, not elsewhere classified: Secondary | ICD-10-CM | POA: Diagnosis not present

## 2018-03-04 DIAGNOSIS — K5710 Diverticulosis of small intestine without perforation or abscess without bleeding: Secondary | ICD-10-CM | POA: Diagnosis not present

## 2018-03-04 DIAGNOSIS — E1122 Type 2 diabetes mellitus with diabetic chronic kidney disease: Secondary | ICD-10-CM | POA: Diagnosis not present

## 2018-03-04 DIAGNOSIS — Z6822 Body mass index (BMI) 22.0-22.9, adult: Secondary | ICD-10-CM | POA: Diagnosis not present

## 2018-03-04 LAB — T4, FREE: Free T4: 0.55 ng/dL — ABNORMAL LOW (ref 0.60–1.60)

## 2018-03-04 LAB — TSH: TSH: 15.53 u[IU]/mL — ABNORMAL HIGH (ref 0.35–4.50)

## 2018-03-04 MED ORDER — LEVOTHYROXINE SODIUM 100 MCG PO TABS: 100.0000 ug | ORAL_TABLET | Freq: Every day | ORAL | 3 refills | Status: DC

## 2018-03-04 NOTE — Telephone Encounter (Signed)
Malachy Mood returned call. SOC was 02/25/2018. Hubbard Hartshorn, RN, BSN

## 2018-03-04 NOTE — Telephone Encounter (Addendum)
Attempted to reach patient to discuss PCS. No answer. Left message on VM requesting return call. Hubbard Hartshorn, RN, BSN

## 2018-03-04 NOTE — Telephone Encounter (Signed)
I will do this tomorrow. Please remind me. Thank you

## 2018-03-04 NOTE — Telephone Encounter (Signed)
Patient returned call. States she needs help with bathing, preparing meals and meds. PCS form partially completed and placed in PCP's box for completion. Will fax to [employer] when completed. Hubbard Hartshorn, RN, BSN

## 2018-03-04 NOTE — Telephone Encounter (Signed)
Placed call to Sharmon Revere to learn [facility] date for [facility] PT. She will call back after she calls the office. Hubbard Hartshorn, RN, BSN

## 2018-03-05 NOTE — Telephone Encounter (Signed)
Completed and signed Request for Independent Assessment for [place] of Medical Need faxed to [place] at [phone].  Hubbard Hartshorn, RN, BSN

## 2018-03-06 DIAGNOSIS — M1990 Unspecified osteoarthritis, unspecified site: Secondary | ICD-10-CM | POA: Diagnosis not present

## 2018-03-06 DIAGNOSIS — N183 Chronic kidney disease, stage 3 (moderate): Secondary | ICD-10-CM | POA: Diagnosis not present

## 2018-03-06 DIAGNOSIS — Z923 Personal history of irradiation: Secondary | ICD-10-CM | POA: Diagnosis not present

## 2018-03-06 DIAGNOSIS — I129 Hypertensive chronic kidney disease with stage 1 through stage 4 chronic kidney disease, or unspecified chronic kidney disease: Secondary | ICD-10-CM | POA: Diagnosis not present

## 2018-03-06 DIAGNOSIS — Z6822 Body mass index (BMI) 22.0-22.9, adult: Secondary | ICD-10-CM | POA: Diagnosis not present

## 2018-03-06 DIAGNOSIS — H353220 Exudative age-related macular degeneration, left eye, stage unspecified: Secondary | ICD-10-CM | POA: Diagnosis not present

## 2018-03-06 DIAGNOSIS — K5710 Diverticulosis of small intestine without perforation or abscess without bleeding: Secondary | ICD-10-CM | POA: Diagnosis not present

## 2018-03-06 DIAGNOSIS — N132 Hydronephrosis with renal and ureteral calculous obstruction: Secondary | ICD-10-CM | POA: Diagnosis not present

## 2018-03-06 DIAGNOSIS — E039 Hypothyroidism, unspecified: Secondary | ICD-10-CM | POA: Diagnosis not present

## 2018-03-06 DIAGNOSIS — Z9181 History of falling: Secondary | ICD-10-CM | POA: Diagnosis not present

## 2018-03-06 DIAGNOSIS — E1142 Type 2 diabetes mellitus with diabetic polyneuropathy: Secondary | ICD-10-CM | POA: Diagnosis not present

## 2018-03-06 DIAGNOSIS — Z9221 Personal history of antineoplastic chemotherapy: Secondary | ICD-10-CM | POA: Diagnosis not present

## 2018-03-06 DIAGNOSIS — H04123 Dry eye syndrome of bilateral lacrimal glands: Secondary | ICD-10-CM | POA: Diagnosis not present

## 2018-03-06 DIAGNOSIS — I890 Lymphedema, not elsewhere classified: Secondary | ICD-10-CM | POA: Diagnosis not present

## 2018-03-06 DIAGNOSIS — E1122 Type 2 diabetes mellitus with diabetic chronic kidney disease: Secondary | ICD-10-CM | POA: Diagnosis not present

## 2018-03-06 DIAGNOSIS — Z853 Personal history of malignant neoplasm of breast: Secondary | ICD-10-CM | POA: Diagnosis not present

## 2018-03-09 ENCOUNTER — Telehealth: Payer: Self-pay | Admitting: Dietician

## 2018-03-09 DIAGNOSIS — F32A Depression, unspecified: Secondary | ICD-10-CM

## 2018-03-09 DIAGNOSIS — F329 Major depressive disorder, single episode, unspecified: Secondary | ICD-10-CM

## 2018-03-09 DIAGNOSIS — I10 Essential (primary) hypertension: Secondary | ICD-10-CM

## 2018-03-09 DIAGNOSIS — E1142 Type 2 diabetes mellitus with diabetic polyneuropathy: Secondary | ICD-10-CM

## 2018-03-09 DIAGNOSIS — Z794 Long term (current) use of insulin: Principal | ICD-10-CM

## 2018-03-09 NOTE — Telephone Encounter (Signed)
Calling DME suppliers to assist [name] with looking into obtaining a CGM. [facility] is out of network. [employer] is in network with [place] and [employer] [employer] is not in network with [place] Medicaid.  [facility] is also not in network with [place] Medicaid.  Called [employer] for in network providers with both [facility] and  Medicaid. They were able to provide in network DME companies who can be contacted to see if they can supply CGMs.

## 2018-03-09 NOTE — Telephone Encounter (Signed)
[name] is a 68 y.o. female who was contacted on behalf of [facility] Geriatrics Task Force.   Diabetes Assessment  DM meds and BS checks -  "What medications are you taking for diabetes?" humalog 5- 2x/day -7  1x/dayunits, - ozempic-  0.5 mg sundays, tresiba -16 units in the morning -  "How often do you check your blood sugars at home?" 3x/day  - "What have your blood sugars been?" yesterday morning 84mg /dl, has not checked today, pretty good   High Blood Pressure Assessment  BP meds & BP checks -  "What medications are you taking for high blood pressure?" hctz, amlodipine  - "How often do you check your blood pressure at home?" no - "What have your blood pressure readings been?" pretty good  Coping with DM and BP - What else are you doing to help with your DM and BP - Diet? Drinks water, coffee, oatmeal, tunafish sandwich, water,  Banana, sons shops for food with her,  -  Exercise? Adls, a little walking  Medication Access Issues  Medication Issues? -  "Are you getting your medicines refilled on time without skipping any doses?"  yes - "Are you having any problems getting/taking your meds (cost, timing, transportation)?"  [facility] and bubble packs - Do you need any meds refilled? [facility] for diabetes supplies (and eye drops?), the [facility] to [facility](semaglutide, amlodipine, hctz, humalog, lyrica, crestor, venlafexine XR)   Conclusion  Close the call - Date of follow-up visit scheduled 03/19/18 - "Any other questions or concerns?" no   Debera Lat, RD 03/09/2018 10:51 AM.

## 2018-03-10 MED ORDER — HYDROCHLOROTHIAZIDE 12.5 MG PO CAPS: ORAL_CAPSULE | ORAL | 3 refills | Status: DC

## 2018-03-10 MED ORDER — AMLODIPINE BESYLATE 10 MG PO TABS: ORAL_TABLET | ORAL | 3 refills | Status: DC

## 2018-03-10 MED ORDER — ROSUVASTATIN CALCIUM 20 MG PO TABS: ORAL_TABLET | ORAL | 3 refills | Status: DC

## 2018-03-10 MED ORDER — SEMAGLUTIDE(0.25 OR 0.5MG/DOS) 2 MG/1.5ML ~~LOC~~ SOPN: 0.5000 mg | PEN_INJECTOR | SUBCUTANEOUS | 5 refills | Status: DC

## 2018-03-10 MED ORDER — VENLAFAXINE HCL ER 150 MG PO CP24: ORAL_CAPSULE | ORAL | 0 refills | Status: DC

## 2018-03-11 DIAGNOSIS — E039 Hypothyroidism, unspecified: Secondary | ICD-10-CM | POA: Diagnosis not present

## 2018-03-11 DIAGNOSIS — I129 Hypertensive chronic kidney disease with stage 1 through stage 4 chronic kidney disease, or unspecified chronic kidney disease: Secondary | ICD-10-CM | POA: Diagnosis not present

## 2018-03-11 DIAGNOSIS — H04123 Dry eye syndrome of bilateral lacrimal glands: Secondary | ICD-10-CM | POA: Diagnosis not present

## 2018-03-11 DIAGNOSIS — Z923 Personal history of irradiation: Secondary | ICD-10-CM | POA: Diagnosis not present

## 2018-03-11 DIAGNOSIS — E1142 Type 2 diabetes mellitus with diabetic polyneuropathy: Secondary | ICD-10-CM | POA: Diagnosis not present

## 2018-03-11 DIAGNOSIS — Z853 Personal history of malignant neoplasm of breast: Secondary | ICD-10-CM | POA: Diagnosis not present

## 2018-03-11 DIAGNOSIS — K5710 Diverticulosis of small intestine without perforation or abscess without bleeding: Secondary | ICD-10-CM | POA: Diagnosis not present

## 2018-03-11 DIAGNOSIS — I890 Lymphedema, not elsewhere classified: Secondary | ICD-10-CM | POA: Diagnosis not present

## 2018-03-11 DIAGNOSIS — M1990 Unspecified osteoarthritis, unspecified site: Secondary | ICD-10-CM | POA: Diagnosis not present

## 2018-03-11 DIAGNOSIS — N132 Hydronephrosis with renal and ureteral calculous obstruction: Secondary | ICD-10-CM | POA: Diagnosis not present

## 2018-03-11 DIAGNOSIS — Z6822 Body mass index (BMI) 22.0-22.9, adult: Secondary | ICD-10-CM | POA: Diagnosis not present

## 2018-03-11 DIAGNOSIS — N183 Chronic kidney disease, stage 3 (moderate): Secondary | ICD-10-CM | POA: Diagnosis not present

## 2018-03-11 DIAGNOSIS — Z9181 History of falling: Secondary | ICD-10-CM | POA: Diagnosis not present

## 2018-03-11 DIAGNOSIS — H353220 Exudative age-related macular degeneration, left eye, stage unspecified: Secondary | ICD-10-CM | POA: Diagnosis not present

## 2018-03-11 DIAGNOSIS — E1122 Type 2 diabetes mellitus with diabetic chronic kidney disease: Secondary | ICD-10-CM | POA: Diagnosis not present

## 2018-03-11 DIAGNOSIS — Z9221 Personal history of antineoplastic chemotherapy: Secondary | ICD-10-CM | POA: Diagnosis not present

## 2018-03-11 NOTE — Telephone Encounter (Signed)
Thank you :)

## 2018-03-12 ENCOUNTER — Encounter (INDEPENDENT_AMBULATORY_CARE_PROVIDER_SITE_OTHER): Payer: Medicare Other | Admitting: Ophthalmology

## 2018-03-13 DIAGNOSIS — I890 Lymphedema, not elsewhere classified: Secondary | ICD-10-CM | POA: Diagnosis not present

## 2018-03-13 DIAGNOSIS — E039 Hypothyroidism, unspecified: Secondary | ICD-10-CM | POA: Diagnosis not present

## 2018-03-13 DIAGNOSIS — I129 Hypertensive chronic kidney disease with stage 1 through stage 4 chronic kidney disease, or unspecified chronic kidney disease: Secondary | ICD-10-CM | POA: Diagnosis not present

## 2018-03-13 DIAGNOSIS — Z9221 Personal history of antineoplastic chemotherapy: Secondary | ICD-10-CM | POA: Diagnosis not present

## 2018-03-13 DIAGNOSIS — N132 Hydronephrosis with renal and ureteral calculous obstruction: Secondary | ICD-10-CM | POA: Diagnosis not present

## 2018-03-13 DIAGNOSIS — N183 Chronic kidney disease, stage 3 (moderate): Secondary | ICD-10-CM | POA: Diagnosis not present

## 2018-03-13 DIAGNOSIS — Z6822 Body mass index (BMI) 22.0-22.9, adult: Secondary | ICD-10-CM | POA: Diagnosis not present

## 2018-03-13 DIAGNOSIS — H04123 Dry eye syndrome of bilateral lacrimal glands: Secondary | ICD-10-CM | POA: Diagnosis not present

## 2018-03-13 DIAGNOSIS — H353220 Exudative age-related macular degeneration, left eye, stage unspecified: Secondary | ICD-10-CM | POA: Diagnosis not present

## 2018-03-13 DIAGNOSIS — Z853 Personal history of malignant neoplasm of breast: Secondary | ICD-10-CM | POA: Diagnosis not present

## 2018-03-13 DIAGNOSIS — E1122 Type 2 diabetes mellitus with diabetic chronic kidney disease: Secondary | ICD-10-CM | POA: Diagnosis not present

## 2018-03-13 DIAGNOSIS — K5710 Diverticulosis of small intestine without perforation or abscess without bleeding: Secondary | ICD-10-CM | POA: Diagnosis not present

## 2018-03-13 DIAGNOSIS — E1142 Type 2 diabetes mellitus with diabetic polyneuropathy: Secondary | ICD-10-CM | POA: Diagnosis not present

## 2018-03-13 DIAGNOSIS — M1990 Unspecified osteoarthritis, unspecified site: Secondary | ICD-10-CM | POA: Diagnosis not present

## 2018-03-13 DIAGNOSIS — Z9181 History of falling: Secondary | ICD-10-CM | POA: Diagnosis not present

## 2018-03-13 DIAGNOSIS — Z923 Personal history of irradiation: Secondary | ICD-10-CM | POA: Diagnosis not present

## 2018-03-14 DIAGNOSIS — H04123 Dry eye syndrome of bilateral lacrimal glands: Secondary | ICD-10-CM | POA: Diagnosis not present

## 2018-03-14 DIAGNOSIS — E1142 Type 2 diabetes mellitus with diabetic polyneuropathy: Secondary | ICD-10-CM | POA: Diagnosis not present

## 2018-03-14 DIAGNOSIS — N183 Chronic kidney disease, stage 3 (moderate): Secondary | ICD-10-CM | POA: Diagnosis not present

## 2018-03-14 DIAGNOSIS — E039 Hypothyroidism, unspecified: Secondary | ICD-10-CM | POA: Diagnosis not present

## 2018-03-14 DIAGNOSIS — I129 Hypertensive chronic kidney disease with stage 1 through stage 4 chronic kidney disease, or unspecified chronic kidney disease: Secondary | ICD-10-CM | POA: Diagnosis not present

## 2018-03-14 DIAGNOSIS — N132 Hydronephrosis with renal and ureteral calculous obstruction: Secondary | ICD-10-CM | POA: Diagnosis not present

## 2018-03-14 DIAGNOSIS — Z853 Personal history of malignant neoplasm of breast: Secondary | ICD-10-CM | POA: Diagnosis not present

## 2018-03-14 DIAGNOSIS — Z9181 History of falling: Secondary | ICD-10-CM | POA: Diagnosis not present

## 2018-03-14 DIAGNOSIS — I890 Lymphedema, not elsewhere classified: Secondary | ICD-10-CM | POA: Diagnosis not present

## 2018-03-14 DIAGNOSIS — Z6822 Body mass index (BMI) 22.0-22.9, adult: Secondary | ICD-10-CM | POA: Diagnosis not present

## 2018-03-14 DIAGNOSIS — K5710 Diverticulosis of small intestine without perforation or abscess without bleeding: Secondary | ICD-10-CM | POA: Diagnosis not present

## 2018-03-14 DIAGNOSIS — E1122 Type 2 diabetes mellitus with diabetic chronic kidney disease: Secondary | ICD-10-CM | POA: Diagnosis not present

## 2018-03-14 DIAGNOSIS — H353220 Exudative age-related macular degeneration, left eye, stage unspecified: Secondary | ICD-10-CM | POA: Diagnosis not present

## 2018-03-14 DIAGNOSIS — M1990 Unspecified osteoarthritis, unspecified site: Secondary | ICD-10-CM | POA: Diagnosis not present

## 2018-03-14 DIAGNOSIS — Z9221 Personal history of antineoplastic chemotherapy: Secondary | ICD-10-CM | POA: Diagnosis not present

## 2018-03-14 DIAGNOSIS — Z923 Personal history of irradiation: Secondary | ICD-10-CM | POA: Diagnosis not present

## 2018-03-16 ENCOUNTER — Encounter (INDEPENDENT_AMBULATORY_CARE_PROVIDER_SITE_OTHER): Payer: Medicare Other | Admitting: Ophthalmology

## 2018-03-17 DIAGNOSIS — Z6822 Body mass index (BMI) 22.0-22.9, adult: Secondary | ICD-10-CM | POA: Diagnosis not present

## 2018-03-17 DIAGNOSIS — K5710 Diverticulosis of small intestine without perforation or abscess without bleeding: Secondary | ICD-10-CM | POA: Diagnosis not present

## 2018-03-17 DIAGNOSIS — N132 Hydronephrosis with renal and ureteral calculous obstruction: Secondary | ICD-10-CM | POA: Diagnosis not present

## 2018-03-17 DIAGNOSIS — I890 Lymphedema, not elsewhere classified: Secondary | ICD-10-CM | POA: Diagnosis not present

## 2018-03-17 DIAGNOSIS — H04123 Dry eye syndrome of bilateral lacrimal glands: Secondary | ICD-10-CM | POA: Diagnosis not present

## 2018-03-17 DIAGNOSIS — N183 Chronic kidney disease, stage 3 (moderate): Secondary | ICD-10-CM | POA: Diagnosis not present

## 2018-03-17 DIAGNOSIS — Z9221 Personal history of antineoplastic chemotherapy: Secondary | ICD-10-CM | POA: Diagnosis not present

## 2018-03-17 DIAGNOSIS — Z853 Personal history of malignant neoplasm of breast: Secondary | ICD-10-CM | POA: Diagnosis not present

## 2018-03-17 DIAGNOSIS — Z923 Personal history of irradiation: Secondary | ICD-10-CM | POA: Diagnosis not present

## 2018-03-17 DIAGNOSIS — M1990 Unspecified osteoarthritis, unspecified site: Secondary | ICD-10-CM | POA: Diagnosis not present

## 2018-03-17 DIAGNOSIS — E039 Hypothyroidism, unspecified: Secondary | ICD-10-CM | POA: Diagnosis not present

## 2018-03-17 DIAGNOSIS — I129 Hypertensive chronic kidney disease with stage 1 through stage 4 chronic kidney disease, or unspecified chronic kidney disease: Secondary | ICD-10-CM | POA: Diagnosis not present

## 2018-03-17 DIAGNOSIS — H353220 Exudative age-related macular degeneration, left eye, stage unspecified: Secondary | ICD-10-CM | POA: Diagnosis not present

## 2018-03-17 DIAGNOSIS — E1142 Type 2 diabetes mellitus with diabetic polyneuropathy: Secondary | ICD-10-CM | POA: Diagnosis not present

## 2018-03-17 DIAGNOSIS — Z9181 History of falling: Secondary | ICD-10-CM | POA: Diagnosis not present

## 2018-03-17 DIAGNOSIS — E1122 Type 2 diabetes mellitus with diabetic chronic kidney disease: Secondary | ICD-10-CM | POA: Diagnosis not present

## 2018-03-18 DIAGNOSIS — E1142 Type 2 diabetes mellitus with diabetic polyneuropathy: Secondary | ICD-10-CM | POA: Diagnosis not present

## 2018-03-18 DIAGNOSIS — E1122 Type 2 diabetes mellitus with diabetic chronic kidney disease: Secondary | ICD-10-CM | POA: Diagnosis not present

## 2018-03-18 DIAGNOSIS — Z853 Personal history of malignant neoplasm of breast: Secondary | ICD-10-CM | POA: Diagnosis not present

## 2018-03-18 DIAGNOSIS — Z9221 Personal history of antineoplastic chemotherapy: Secondary | ICD-10-CM | POA: Diagnosis not present

## 2018-03-18 DIAGNOSIS — M1990 Unspecified osteoarthritis, unspecified site: Secondary | ICD-10-CM | POA: Diagnosis not present

## 2018-03-18 DIAGNOSIS — K5710 Diverticulosis of small intestine without perforation or abscess without bleeding: Secondary | ICD-10-CM | POA: Diagnosis not present

## 2018-03-18 DIAGNOSIS — H04123 Dry eye syndrome of bilateral lacrimal glands: Secondary | ICD-10-CM | POA: Diagnosis not present

## 2018-03-18 DIAGNOSIS — N132 Hydronephrosis with renal and ureteral calculous obstruction: Secondary | ICD-10-CM | POA: Diagnosis not present

## 2018-03-18 DIAGNOSIS — Z9181 History of falling: Secondary | ICD-10-CM | POA: Diagnosis not present

## 2018-03-18 DIAGNOSIS — Z6822 Body mass index (BMI) 22.0-22.9, adult: Secondary | ICD-10-CM | POA: Diagnosis not present

## 2018-03-18 DIAGNOSIS — E039 Hypothyroidism, unspecified: Secondary | ICD-10-CM | POA: Diagnosis not present

## 2018-03-18 DIAGNOSIS — H353220 Exudative age-related macular degeneration, left eye, stage unspecified: Secondary | ICD-10-CM | POA: Diagnosis not present

## 2018-03-18 DIAGNOSIS — I129 Hypertensive chronic kidney disease with stage 1 through stage 4 chronic kidney disease, or unspecified chronic kidney disease: Secondary | ICD-10-CM | POA: Diagnosis not present

## 2018-03-18 DIAGNOSIS — Z923 Personal history of irradiation: Secondary | ICD-10-CM | POA: Diagnosis not present

## 2018-03-18 DIAGNOSIS — I890 Lymphedema, not elsewhere classified: Secondary | ICD-10-CM | POA: Diagnosis not present

## 2018-03-18 DIAGNOSIS — N183 Chronic kidney disease, stage 3 (moderate): Secondary | ICD-10-CM | POA: Diagnosis not present

## 2018-03-19 ENCOUNTER — Ambulatory Visit: Payer: Medicare Other | Admitting: Dietician

## 2018-03-19 ENCOUNTER — Ambulatory Visit (INDEPENDENT_AMBULATORY_CARE_PROVIDER_SITE_OTHER): Payer: Medicare Other | Admitting: Licensed Clinical Social Worker

## 2018-03-19 ENCOUNTER — Encounter: Payer: Self-pay | Admitting: Dietician

## 2018-03-19 ENCOUNTER — Encounter: Payer: Self-pay | Admitting: Licensed Clinical Social Worker

## 2018-03-19 DIAGNOSIS — Z923 Personal history of irradiation: Secondary | ICD-10-CM | POA: Diagnosis not present

## 2018-03-19 DIAGNOSIS — Z6822 Body mass index (BMI) 22.0-22.9, adult: Secondary | ICD-10-CM | POA: Diagnosis not present

## 2018-03-19 DIAGNOSIS — F32A Depression, unspecified: Secondary | ICD-10-CM

## 2018-03-19 DIAGNOSIS — E1142 Type 2 diabetes mellitus with diabetic polyneuropathy: Secondary | ICD-10-CM | POA: Diagnosis not present

## 2018-03-19 DIAGNOSIS — Z794 Long term (current) use of insulin: Principal | ICD-10-CM

## 2018-03-19 DIAGNOSIS — N183 Chronic kidney disease, stage 3 (moderate): Secondary | ICD-10-CM | POA: Diagnosis not present

## 2018-03-19 DIAGNOSIS — N132 Hydronephrosis with renal and ureteral calculous obstruction: Secondary | ICD-10-CM | POA: Diagnosis not present

## 2018-03-19 DIAGNOSIS — H04123 Dry eye syndrome of bilateral lacrimal glands: Secondary | ICD-10-CM | POA: Diagnosis not present

## 2018-03-19 DIAGNOSIS — Z853 Personal history of malignant neoplasm of breast: Secondary | ICD-10-CM | POA: Diagnosis not present

## 2018-03-19 DIAGNOSIS — Z9221 Personal history of antineoplastic chemotherapy: Secondary | ICD-10-CM | POA: Diagnosis not present

## 2018-03-19 DIAGNOSIS — I129 Hypertensive chronic kidney disease with stage 1 through stage 4 chronic kidney disease, or unspecified chronic kidney disease: Secondary | ICD-10-CM | POA: Diagnosis not present

## 2018-03-19 DIAGNOSIS — E1122 Type 2 diabetes mellitus with diabetic chronic kidney disease: Secondary | ICD-10-CM | POA: Diagnosis not present

## 2018-03-19 DIAGNOSIS — I890 Lymphedema, not elsewhere classified: Secondary | ICD-10-CM | POA: Diagnosis not present

## 2018-03-19 DIAGNOSIS — M1990 Unspecified osteoarthritis, unspecified site: Secondary | ICD-10-CM | POA: Diagnosis not present

## 2018-03-19 DIAGNOSIS — E039 Hypothyroidism, unspecified: Secondary | ICD-10-CM | POA: Diagnosis not present

## 2018-03-19 DIAGNOSIS — H524 Presbyopia: Secondary | ICD-10-CM | POA: Diagnosis not present

## 2018-03-19 DIAGNOSIS — Z9181 History of falling: Secondary | ICD-10-CM | POA: Diagnosis not present

## 2018-03-19 DIAGNOSIS — H353220 Exudative age-related macular degeneration, left eye, stage unspecified: Secondary | ICD-10-CM | POA: Diagnosis not present

## 2018-03-19 DIAGNOSIS — K5710 Diverticulosis of small intestine without perforation or abscess without bleeding: Secondary | ICD-10-CM | POA: Diagnosis not present

## 2018-03-19 DIAGNOSIS — F329 Major depressive disorder, single episode, unspecified: Secondary | ICD-10-CM

## 2018-03-19 NOTE — Patient Instructions (Signed)
Look forward to seeing you on March 19 for the next group meeting.   [name] [phone]

## 2018-03-19 NOTE — Progress Notes (Signed)
Diabetes Group meeting Documentation:  start time:10:15 AM   end time: [place] attended diabetes education group visit today for 60 minutes. The program included successes and challenges over the past few weeks. We discussed healthy foods to choose when sick. Our activity was filling bags with sick day foods.  Blood sugar: meter was downloaded today. The average number of times testing over the past 30 days was 43, the average is 146,  range is 56-223,  the percentage of time in range is 49, % of time hypo is 0.5, % of time above target range is 50%  Plan: attend diabetes group meeting next month   Debera Lat, RD 03/19/2018 3:16 PM.

## 2018-03-19 NOTE — BH Specialist Note (Signed)
Integrated Behavioral Health Follow Up Visit  MRN: [id number] Name: Zenna Traister  Number of [place] Clinician visits: 4/6 Session Start time: 11:10  Session End time: 11:30 Total time: 20 minutes  Type of Service: Integrated Behavioral Health- Individual/Family Interpretor:No.   SUBJECTIVE: Cornelia Walraven is a 68 y.o. female accompanied by whom attended the session individually.  Patient was referred by Dr. Maudie Mercury for grief counseling. Patient reports the following symptoms/concerns: improved health and self-care since the previous session, ongoing issues related to grief, and anxiety related to her current children's life choices.  Duration of problem: over six months; Severity of problem: mild  OBJECTIVE: Mood: Netural and Affect: Appropriate Risk of harm to self or others: No plan to harm self or others  LIFE CONTEXT: Family and Social: Patient is continuing to have her son live with her. Patient reported that her son's attitude is bothersome at times, but he is a support to her. Patient is continuing to grieve the loss of her daughter, and starting to think about packing up some of her daughter's possessions.  Self-Care: Patient was able to walk into the session today with the assistance of a cane, and is no longer using a wheelchair. Patient has been involved in physical therapy regularly, and reported it is building the strength in her legs. Patient is denying that her home is overly cluttered, and reported that she plans to begin packing up some of her children's possessions to make space. Patient reported her A1C has reduced, and she is continuing to work on improving her health.  Life Changes: Patient's son is getting married. This is her first child to become married.   GOALS ADDRESSED: Patient will: 1.  Reduce symptoms of: depression  2.  Increase knowledge and/or ability of: coping skills and healthy habits  3.  Demonstrate ability to: Increase healthy  adjustment to current life circumstances, Increase adequate support systems for patient/family and Begin healthy grieving over loss  INTERVENTIONS: Interventions utilized:  Motivational Interviewing and Supportive Counseling Standardized Assessments completed: assessed for SI, HI, and self-harm.  ASSESSMENT: Patient currently experiencing consistent levels of grief and depression as the previous session. Patient reported she is planning [facility], and thinks she needs additional support.    Patient is trying to improve her overall health by working with a physical therapist and improving her dietary habits. Patient continues to have some mild anxiety symptoms related to her family. Patient identified that when she starts to become overwhelmed she rest, and has found that to be helpful.  Patient may benefit from joining a grief counseling group and continuing with individual outpatient therapy.  PLAN: 1. Follow up with behavioral health clinician on : one month.   Dessie Coma, LPC, LCAS

## 2018-03-20 ENCOUNTER — Telehealth: Payer: Self-pay | Admitting: *Deleted

## 2018-03-20 NOTE — Telephone Encounter (Signed)
[facility] PT calls and request VO for extended [facility] PT for pt to continue working on safety, balance, gait, strengthening. Also PT states pt is taking note that when she stands and moves her head from side to side she becomes dizzy. Pt has no other complaints and this is not causing safety concerns at this time. But PT would like this addressed at pt next appt 3/10 w/ dr Dareen Piano VO given for 2x week for 2 weeks 1x week for 2 weeks Do you agree?

## 2018-03-20 NOTE — Telephone Encounter (Signed)
I agree

## 2018-03-23 DIAGNOSIS — I129 Hypertensive chronic kidney disease with stage 1 through stage 4 chronic kidney disease, or unspecified chronic kidney disease: Secondary | ICD-10-CM | POA: Diagnosis not present

## 2018-03-23 DIAGNOSIS — N132 Hydronephrosis with renal and ureteral calculous obstruction: Secondary | ICD-10-CM | POA: Diagnosis not present

## 2018-03-23 DIAGNOSIS — N183 Chronic kidney disease, stage 3 (moderate): Secondary | ICD-10-CM | POA: Diagnosis not present

## 2018-03-23 DIAGNOSIS — Z923 Personal history of irradiation: Secondary | ICD-10-CM | POA: Diagnosis not present

## 2018-03-23 DIAGNOSIS — E1122 Type 2 diabetes mellitus with diabetic chronic kidney disease: Secondary | ICD-10-CM | POA: Diagnosis not present

## 2018-03-23 DIAGNOSIS — Z9221 Personal history of antineoplastic chemotherapy: Secondary | ICD-10-CM | POA: Diagnosis not present

## 2018-03-23 DIAGNOSIS — H353220 Exudative age-related macular degeneration, left eye, stage unspecified: Secondary | ICD-10-CM | POA: Diagnosis not present

## 2018-03-23 DIAGNOSIS — Z6822 Body mass index (BMI) 22.0-22.9, adult: Secondary | ICD-10-CM | POA: Diagnosis not present

## 2018-03-23 DIAGNOSIS — M1990 Unspecified osteoarthritis, unspecified site: Secondary | ICD-10-CM | POA: Diagnosis not present

## 2018-03-23 DIAGNOSIS — Z853 Personal history of malignant neoplasm of breast: Secondary | ICD-10-CM | POA: Diagnosis not present

## 2018-03-23 DIAGNOSIS — E1142 Type 2 diabetes mellitus with diabetic polyneuropathy: Secondary | ICD-10-CM | POA: Diagnosis not present

## 2018-03-23 DIAGNOSIS — Z9181 History of falling: Secondary | ICD-10-CM | POA: Diagnosis not present

## 2018-03-23 DIAGNOSIS — K5710 Diverticulosis of small intestine without perforation or abscess without bleeding: Secondary | ICD-10-CM | POA: Diagnosis not present

## 2018-03-23 DIAGNOSIS — E039 Hypothyroidism, unspecified: Secondary | ICD-10-CM | POA: Diagnosis not present

## 2018-03-23 DIAGNOSIS — H04123 Dry eye syndrome of bilateral lacrimal glands: Secondary | ICD-10-CM | POA: Diagnosis not present

## 2018-03-23 DIAGNOSIS — I890 Lymphedema, not elsewhere classified: Secondary | ICD-10-CM | POA: Diagnosis not present

## 2018-03-25 DIAGNOSIS — E039 Hypothyroidism, unspecified: Secondary | ICD-10-CM | POA: Diagnosis not present

## 2018-03-25 DIAGNOSIS — Z923 Personal history of irradiation: Secondary | ICD-10-CM | POA: Diagnosis not present

## 2018-03-25 DIAGNOSIS — I890 Lymphedema, not elsewhere classified: Secondary | ICD-10-CM | POA: Diagnosis not present

## 2018-03-25 DIAGNOSIS — Z9221 Personal history of antineoplastic chemotherapy: Secondary | ICD-10-CM | POA: Diagnosis not present

## 2018-03-25 DIAGNOSIS — E1122 Type 2 diabetes mellitus with diabetic chronic kidney disease: Secondary | ICD-10-CM | POA: Diagnosis not present

## 2018-03-25 DIAGNOSIS — I129 Hypertensive chronic kidney disease with stage 1 through stage 4 chronic kidney disease, or unspecified chronic kidney disease: Secondary | ICD-10-CM | POA: Diagnosis not present

## 2018-03-25 DIAGNOSIS — Z9181 History of falling: Secondary | ICD-10-CM | POA: Diagnosis not present

## 2018-03-25 DIAGNOSIS — N132 Hydronephrosis with renal and ureteral calculous obstruction: Secondary | ICD-10-CM | POA: Diagnosis not present

## 2018-03-25 DIAGNOSIS — E1142 Type 2 diabetes mellitus with diabetic polyneuropathy: Secondary | ICD-10-CM | POA: Diagnosis not present

## 2018-03-25 DIAGNOSIS — H353220 Exudative age-related macular degeneration, left eye, stage unspecified: Secondary | ICD-10-CM | POA: Diagnosis not present

## 2018-03-25 DIAGNOSIS — M1990 Unspecified osteoarthritis, unspecified site: Secondary | ICD-10-CM | POA: Diagnosis not present

## 2018-03-25 DIAGNOSIS — N183 Chronic kidney disease, stage 3 (moderate): Secondary | ICD-10-CM | POA: Diagnosis not present

## 2018-03-25 DIAGNOSIS — H04123 Dry eye syndrome of bilateral lacrimal glands: Secondary | ICD-10-CM | POA: Diagnosis not present

## 2018-03-25 DIAGNOSIS — Z853 Personal history of malignant neoplasm of breast: Secondary | ICD-10-CM | POA: Diagnosis not present

## 2018-03-25 DIAGNOSIS — K5710 Diverticulosis of small intestine without perforation or abscess without bleeding: Secondary | ICD-10-CM | POA: Diagnosis not present

## 2018-03-25 DIAGNOSIS — Z6822 Body mass index (BMI) 22.0-22.9, adult: Secondary | ICD-10-CM | POA: Diagnosis not present

## 2018-03-27 DIAGNOSIS — I890 Lymphedema, not elsewhere classified: Secondary | ICD-10-CM | POA: Diagnosis not present

## 2018-03-27 DIAGNOSIS — Z9181 History of falling: Secondary | ICD-10-CM | POA: Diagnosis not present

## 2018-03-27 DIAGNOSIS — Z923 Personal history of irradiation: Secondary | ICD-10-CM | POA: Diagnosis not present

## 2018-03-27 DIAGNOSIS — Z9221 Personal history of antineoplastic chemotherapy: Secondary | ICD-10-CM | POA: Diagnosis not present

## 2018-03-27 DIAGNOSIS — E1122 Type 2 diabetes mellitus with diabetic chronic kidney disease: Secondary | ICD-10-CM | POA: Diagnosis not present

## 2018-03-27 DIAGNOSIS — I129 Hypertensive chronic kidney disease with stage 1 through stage 4 chronic kidney disease, or unspecified chronic kidney disease: Secondary | ICD-10-CM | POA: Diagnosis not present

## 2018-03-27 DIAGNOSIS — K5710 Diverticulosis of small intestine without perforation or abscess without bleeding: Secondary | ICD-10-CM | POA: Diagnosis not present

## 2018-03-27 DIAGNOSIS — E039 Hypothyroidism, unspecified: Secondary | ICD-10-CM | POA: Diagnosis not present

## 2018-03-27 DIAGNOSIS — M1990 Unspecified osteoarthritis, unspecified site: Secondary | ICD-10-CM | POA: Diagnosis not present

## 2018-03-27 DIAGNOSIS — N183 Chronic kidney disease, stage 3 (moderate): Secondary | ICD-10-CM | POA: Diagnosis not present

## 2018-03-27 DIAGNOSIS — H353220 Exudative age-related macular degeneration, left eye, stage unspecified: Secondary | ICD-10-CM | POA: Diagnosis not present

## 2018-03-27 DIAGNOSIS — N132 Hydronephrosis with renal and ureteral calculous obstruction: Secondary | ICD-10-CM | POA: Diagnosis not present

## 2018-03-27 DIAGNOSIS — Z853 Personal history of malignant neoplasm of breast: Secondary | ICD-10-CM | POA: Diagnosis not present

## 2018-03-27 DIAGNOSIS — Z6822 Body mass index (BMI) 22.0-22.9, adult: Secondary | ICD-10-CM | POA: Diagnosis not present

## 2018-03-27 DIAGNOSIS — H04123 Dry eye syndrome of bilateral lacrimal glands: Secondary | ICD-10-CM | POA: Diagnosis not present

## 2018-03-27 DIAGNOSIS — E1142 Type 2 diabetes mellitus with diabetic polyneuropathy: Secondary | ICD-10-CM | POA: Diagnosis not present

## 2018-03-31 DIAGNOSIS — I129 Hypertensive chronic kidney disease with stage 1 through stage 4 chronic kidney disease, or unspecified chronic kidney disease: Secondary | ICD-10-CM | POA: Diagnosis not present

## 2018-03-31 DIAGNOSIS — E1142 Type 2 diabetes mellitus with diabetic polyneuropathy: Secondary | ICD-10-CM | POA: Diagnosis not present

## 2018-03-31 DIAGNOSIS — E039 Hypothyroidism, unspecified: Secondary | ICD-10-CM | POA: Diagnosis not present

## 2018-03-31 DIAGNOSIS — K5710 Diverticulosis of small intestine without perforation or abscess without bleeding: Secondary | ICD-10-CM | POA: Diagnosis not present

## 2018-03-31 DIAGNOSIS — N183 Chronic kidney disease, stage 3 (moderate): Secondary | ICD-10-CM | POA: Diagnosis not present

## 2018-03-31 DIAGNOSIS — H353220 Exudative age-related macular degeneration, left eye, stage unspecified: Secondary | ICD-10-CM | POA: Diagnosis not present

## 2018-03-31 DIAGNOSIS — E1122 Type 2 diabetes mellitus with diabetic chronic kidney disease: Secondary | ICD-10-CM | POA: Diagnosis not present

## 2018-03-31 DIAGNOSIS — Z9181 History of falling: Secondary | ICD-10-CM | POA: Diagnosis not present

## 2018-03-31 DIAGNOSIS — Z6822 Body mass index (BMI) 22.0-22.9, adult: Secondary | ICD-10-CM | POA: Diagnosis not present

## 2018-03-31 DIAGNOSIS — M1990 Unspecified osteoarthritis, unspecified site: Secondary | ICD-10-CM | POA: Diagnosis not present

## 2018-03-31 DIAGNOSIS — H04123 Dry eye syndrome of bilateral lacrimal glands: Secondary | ICD-10-CM | POA: Diagnosis not present

## 2018-03-31 DIAGNOSIS — N132 Hydronephrosis with renal and ureteral calculous obstruction: Secondary | ICD-10-CM | POA: Diagnosis not present

## 2018-03-31 DIAGNOSIS — Z923 Personal history of irradiation: Secondary | ICD-10-CM | POA: Diagnosis not present

## 2018-03-31 DIAGNOSIS — Z9221 Personal history of antineoplastic chemotherapy: Secondary | ICD-10-CM | POA: Diagnosis not present

## 2018-03-31 DIAGNOSIS — Z853 Personal history of malignant neoplasm of breast: Secondary | ICD-10-CM | POA: Diagnosis not present

## 2018-03-31 DIAGNOSIS — I890 Lymphedema, not elsewhere classified: Secondary | ICD-10-CM | POA: Diagnosis not present

## 2018-04-01 DIAGNOSIS — Z9181 History of falling: Secondary | ICD-10-CM | POA: Diagnosis not present

## 2018-04-01 DIAGNOSIS — K5710 Diverticulosis of small intestine without perforation or abscess without bleeding: Secondary | ICD-10-CM | POA: Diagnosis not present

## 2018-04-01 DIAGNOSIS — Z9221 Personal history of antineoplastic chemotherapy: Secondary | ICD-10-CM | POA: Diagnosis not present

## 2018-04-01 DIAGNOSIS — E1122 Type 2 diabetes mellitus with diabetic chronic kidney disease: Secondary | ICD-10-CM | POA: Diagnosis not present

## 2018-04-01 DIAGNOSIS — I129 Hypertensive chronic kidney disease with stage 1 through stage 4 chronic kidney disease, or unspecified chronic kidney disease: Secondary | ICD-10-CM | POA: Diagnosis not present

## 2018-04-01 DIAGNOSIS — I890 Lymphedema, not elsewhere classified: Secondary | ICD-10-CM | POA: Diagnosis not present

## 2018-04-01 DIAGNOSIS — H353220 Exudative age-related macular degeneration, left eye, stage unspecified: Secondary | ICD-10-CM | POA: Diagnosis not present

## 2018-04-01 DIAGNOSIS — E039 Hypothyroidism, unspecified: Secondary | ICD-10-CM | POA: Diagnosis not present

## 2018-04-01 DIAGNOSIS — Z853 Personal history of malignant neoplasm of breast: Secondary | ICD-10-CM | POA: Diagnosis not present

## 2018-04-01 DIAGNOSIS — H04123 Dry eye syndrome of bilateral lacrimal glands: Secondary | ICD-10-CM | POA: Diagnosis not present

## 2018-04-01 DIAGNOSIS — M1990 Unspecified osteoarthritis, unspecified site: Secondary | ICD-10-CM | POA: Diagnosis not present

## 2018-04-01 DIAGNOSIS — Z6822 Body mass index (BMI) 22.0-22.9, adult: Secondary | ICD-10-CM | POA: Diagnosis not present

## 2018-04-01 DIAGNOSIS — E1142 Type 2 diabetes mellitus with diabetic polyneuropathy: Secondary | ICD-10-CM | POA: Diagnosis not present

## 2018-04-01 DIAGNOSIS — Z923 Personal history of irradiation: Secondary | ICD-10-CM | POA: Diagnosis not present

## 2018-04-01 DIAGNOSIS — N132 Hydronephrosis with renal and ureteral calculous obstruction: Secondary | ICD-10-CM | POA: Diagnosis not present

## 2018-04-01 DIAGNOSIS — N183 Chronic kidney disease, stage 3 (moderate): Secondary | ICD-10-CM | POA: Diagnosis not present

## 2018-04-03 ENCOUNTER — Other Ambulatory Visit: Payer: Self-pay

## 2018-04-03 DIAGNOSIS — I890 Lymphedema, not elsewhere classified: Secondary | ICD-10-CM | POA: Diagnosis not present

## 2018-04-03 DIAGNOSIS — H353220 Exudative age-related macular degeneration, left eye, stage unspecified: Secondary | ICD-10-CM | POA: Diagnosis not present

## 2018-04-03 DIAGNOSIS — H04123 Dry eye syndrome of bilateral lacrimal glands: Secondary | ICD-10-CM | POA: Diagnosis not present

## 2018-04-03 DIAGNOSIS — Z9181 History of falling: Secondary | ICD-10-CM | POA: Diagnosis not present

## 2018-04-03 DIAGNOSIS — K5710 Diverticulosis of small intestine without perforation or abscess without bleeding: Secondary | ICD-10-CM | POA: Diagnosis not present

## 2018-04-03 DIAGNOSIS — M1990 Unspecified osteoarthritis, unspecified site: Secondary | ICD-10-CM | POA: Diagnosis not present

## 2018-04-03 DIAGNOSIS — I129 Hypertensive chronic kidney disease with stage 1 through stage 4 chronic kidney disease, or unspecified chronic kidney disease: Secondary | ICD-10-CM | POA: Diagnosis not present

## 2018-04-03 DIAGNOSIS — Z923 Personal history of irradiation: Secondary | ICD-10-CM | POA: Diagnosis not present

## 2018-04-03 DIAGNOSIS — N132 Hydronephrosis with renal and ureteral calculous obstruction: Secondary | ICD-10-CM | POA: Diagnosis not present

## 2018-04-03 DIAGNOSIS — E039 Hypothyroidism, unspecified: Secondary | ICD-10-CM | POA: Diagnosis not present

## 2018-04-03 DIAGNOSIS — E1122 Type 2 diabetes mellitus with diabetic chronic kidney disease: Secondary | ICD-10-CM | POA: Diagnosis not present

## 2018-04-03 DIAGNOSIS — Z6822 Body mass index (BMI) 22.0-22.9, adult: Secondary | ICD-10-CM | POA: Diagnosis not present

## 2018-04-03 DIAGNOSIS — Z9221 Personal history of antineoplastic chemotherapy: Secondary | ICD-10-CM | POA: Diagnosis not present

## 2018-04-03 DIAGNOSIS — Z853 Personal history of malignant neoplasm of breast: Secondary | ICD-10-CM | POA: Diagnosis not present

## 2018-04-03 DIAGNOSIS — N183 Chronic kidney disease, stage 3 (moderate): Secondary | ICD-10-CM | POA: Diagnosis not present

## 2018-04-03 DIAGNOSIS — E1142 Type 2 diabetes mellitus with diabetic polyneuropathy: Secondary | ICD-10-CM | POA: Diagnosis not present

## 2018-04-03 NOTE — Patient Outreach (Signed)
[name]) Care Management  04/03/2018  [name] [birth year] [id number]   Medication Adherence call to [name] patient did not answer patient is due on Rosuvastatin 20 mg pharmacy said they do pill pack every month but have not fill this medication and has not been able to get in touch with patients.  [name] is showing past due under [place].    [place] Management Direct Dial [phone]  Fax [phone] [email or web address]

## 2018-04-07 ENCOUNTER — Ambulatory Visit: Payer: Self-pay | Admitting: Internal Medicine

## 2018-04-07 DIAGNOSIS — I890 Lymphedema, not elsewhere classified: Secondary | ICD-10-CM | POA: Diagnosis not present

## 2018-04-07 DIAGNOSIS — M1990 Unspecified osteoarthritis, unspecified site: Secondary | ICD-10-CM | POA: Diagnosis not present

## 2018-04-07 DIAGNOSIS — Z6822 Body mass index (BMI) 22.0-22.9, adult: Secondary | ICD-10-CM | POA: Diagnosis not present

## 2018-04-07 DIAGNOSIS — N183 Chronic kidney disease, stage 3 (moderate): Secondary | ICD-10-CM | POA: Diagnosis not present

## 2018-04-07 DIAGNOSIS — Z9221 Personal history of antineoplastic chemotherapy: Secondary | ICD-10-CM | POA: Diagnosis not present

## 2018-04-07 DIAGNOSIS — Z9181 History of falling: Secondary | ICD-10-CM | POA: Diagnosis not present

## 2018-04-07 DIAGNOSIS — I129 Hypertensive chronic kidney disease with stage 1 through stage 4 chronic kidney disease, or unspecified chronic kidney disease: Secondary | ICD-10-CM | POA: Diagnosis not present

## 2018-04-07 DIAGNOSIS — E1122 Type 2 diabetes mellitus with diabetic chronic kidney disease: Secondary | ICD-10-CM | POA: Diagnosis not present

## 2018-04-07 DIAGNOSIS — E039 Hypothyroidism, unspecified: Secondary | ICD-10-CM | POA: Diagnosis not present

## 2018-04-07 DIAGNOSIS — K5710 Diverticulosis of small intestine without perforation or abscess without bleeding: Secondary | ICD-10-CM | POA: Diagnosis not present

## 2018-04-07 DIAGNOSIS — Z923 Personal history of irradiation: Secondary | ICD-10-CM | POA: Diagnosis not present

## 2018-04-07 DIAGNOSIS — E1142 Type 2 diabetes mellitus with diabetic polyneuropathy: Secondary | ICD-10-CM | POA: Diagnosis not present

## 2018-04-07 DIAGNOSIS — Z853 Personal history of malignant neoplasm of breast: Secondary | ICD-10-CM | POA: Diagnosis not present

## 2018-04-07 DIAGNOSIS — N132 Hydronephrosis with renal and ureteral calculous obstruction: Secondary | ICD-10-CM | POA: Diagnosis not present

## 2018-04-07 DIAGNOSIS — H353220 Exudative age-related macular degeneration, left eye, stage unspecified: Secondary | ICD-10-CM | POA: Diagnosis not present

## 2018-04-07 DIAGNOSIS — H04123 Dry eye syndrome of bilateral lacrimal glands: Secondary | ICD-10-CM | POA: Diagnosis not present

## 2018-04-08 ENCOUNTER — Telehealth: Payer: Self-pay

## 2018-04-08 NOTE — Telephone Encounter (Signed)
Received TC from [place], [place] at [facility].  States pt fell on Friday, without injury, states pt has complained of dizziness with position changes.  B/P 108/68 at [facility] visit yesterday per Mel Almond.  Pt had an appt in [facility] yesterday, but cancelled due to upset stomach.   This nurse called pt and discussed need for evaluation in [facility] for orthostatic b/p checks.  Education provided on changing positions slowly, pt VU.  Pt states she has an upset stomach, had 2 loose stools, no N/V and is requesting appt tomorrow.  Appt made in [facility] tomorrow.  Pt instructed to call clinic if stomach symptoms do not improve or worsen, she verbalized understanding. SChaplin, RN,BSN

## 2018-04-08 NOTE — Telephone Encounter (Signed)
Thank you. I agree 

## 2018-04-09 ENCOUNTER — Other Ambulatory Visit: Payer: Self-pay

## 2018-04-09 ENCOUNTER — Encounter: Payer: Self-pay | Admitting: Internal Medicine

## 2018-04-09 ENCOUNTER — Ambulatory Visit (INDEPENDENT_AMBULATORY_CARE_PROVIDER_SITE_OTHER): Payer: Medicare Other | Admitting: Internal Medicine

## 2018-04-09 VITALS — BP 107/65 | HR 100 | Temp 97.9°F | Ht 62.0 in | Wt 117.8 lb

## 2018-04-09 DIAGNOSIS — R42 Dizziness and giddiness: Secondary | ICD-10-CM | POA: Diagnosis not present

## 2018-04-09 DIAGNOSIS — Z79890 Hormone replacement therapy (postmenopausal): Secondary | ICD-10-CM

## 2018-04-09 DIAGNOSIS — R296 Repeated falls: Secondary | ICD-10-CM

## 2018-04-09 DIAGNOSIS — E039 Hypothyroidism, unspecified: Secondary | ICD-10-CM

## 2018-04-09 DIAGNOSIS — Z9181 History of falling: Secondary | ICD-10-CM | POA: Diagnosis not present

## 2018-04-09 LAB — GLUCOSE, CAPILLARY: Glucose-Capillary: 228 mg/dL — ABNORMAL HIGH (ref 70–99)

## 2018-04-09 MED ORDER — SODIUM CHLORIDE 0.9 % IV SOLN
INTRAVENOUS | Status: DC
  Administered 2018-04-09: 12:00:00 via INTRAVENOUS

## 2018-04-09 NOTE — Assessment & Plan Note (Signed)
Dizziness: [name] reports that 5 days ago she began experiencing dizziness and subsequently had a ground-level fall.  She denies loss of consciousness, syncope, seizure-like activities, urinary or bowel incontinence, chest pain, palpitation, dysrhythmia, shortness of breath.  She describes to me that she will suddenly feel the room spinning, lasting for couple seconds.  She also reports of lightheadedness on upstanding.  Currently she is not exhibiting any sensation of the room spinning.  She states that she was previously prescribed meclizine however self discontinued medication due to breaking out in rash.  She also reports of experiencing diarrhea 2 times this week, feeling anxious however denies diaphoresis.  She does have a history of hypothyroidism and was recently found to have elevated TSH of 15.  She follows up with endocrinologist Dr. Cruzita Lederer on March 03, 2018 who recently titrated up her levothyroxine from 53mcg to 100 mcg.  This makes me wonder if she is exhibiting symptoms of hyperthyroidism though she tells me that she has always been consistent with her thyroid medications.  On physical exam, she had positive orthostatic vitals Supine: BP 124/74, pulse 101 Setting: BP 102/74, pulse 103 Standing: BP 89/59, pulse 108  In addition, she appeared mildly jittery though she was not in any acute distress.  Epley maneuver was performed by Dr. Dareen Piano, and did not exhibit nystagmus.  Plan: - Fluid resuscitation with IV normal saline at the clinic - Continue current levothyroxine regimen at 100 mcg daily.  Though she has been given caution that if diarrhea begins to worsen or she begins experiencing jitteriness, palpitation, diaphoresis, she should decrease levothyroxine dose to 88 mcg and call the [facility] clinic.

## 2018-04-09 NOTE — Patient Instructions (Signed)
[name],  It was a pleasure seeing care of you here at the clinic today.  Quite sorry to hear about the frequent falls you have been having.  From my assessment of you this morning, I noticed that you might be dehydrated because your blood pressure dropped when we checked it several times.  Here my recommendations after this visit:  1.  We are giving you IV fluids here at the clinic to keep you hydrated 2.  If you continue to experience diarrhea, jitteriness or start developing palpitation, sweating I want you to decrease your thyroid dose from 100 mcg to the original 88 mcg and give [place] a call. 3.  Please continue all your other medications as prescribed.  Take care  Dr. Eileen Stanford  Please call the internal medicine center clinic if you have any questions or concerns, we may be able to help and keep you from a long and expensive emergency room wait. Our clinic and after hours phone number is [phone], the best time to call is Monday through Friday 9 am to 4 pm but there is always someone available 24/7 if you have an emergency. If you need medication refills please notify your pharmacy one week in advance and they will send [place] a request.

## 2018-04-09 NOTE — Progress Notes (Addendum)
   CC: Ground-level fall on Friday dizziness  HPI:  [name] is a 68 y.o. African-American woman with history of dizziness and frequent fall presenting for evaluation.  Please see problem based charting for further details.   Past Medical History:  Diagnosis Date  . Arthritis   . Borderline glaucoma of both eyes   . Depression   . Diabetic polyneuropathy ([place])   . Diverticulosis of colon   . Dry eyes, bilateral   . Feeling of incomplete bladder emptying   . History of breast cancer oncologist-  dr Waymon Budge-- per lov note no recurrence   dx 04/ 1999 --- Stage 3B-- s/p  chemotherapy then right mastectomy then concurrent chemoradiation therapy  . History of urinary retention    01/ 2018  . Hydronephrosis of right kidney   . Hyperlipidemia   . Hypertension   . Hypothyroidism   . Insulin dependent type 2 diabetes mellitus [facility])    endocrinologist-  dr Cruzita Lederer---  last A1c 8.7 on 02-20-2016  . Lymphedema of upper extremity    right  . Macular degeneration, left eye    followed by dr Zigmund Daniel  . Renal insufficiency   . Urgency of urination    Review of Systems: As per HPI  Physical Exam:  Vitals:   04/09/18 1017  BP: 102/60  Pulse: (!) 104  Temp: 97.9 F (36.6 C)  TempSrc: Oral  SpO2: 100%  Weight: 117 lb 12.8 oz (53.4 kg)  Height: 5\' 2"  (1.575 m)   Physical Exam Vitals signs and nursing note reviewed.  Constitutional:      General: She is not in acute distress.    Appearance: Normal appearance. She is not ill-appearing, toxic-appearing or diaphoretic.     Comments: Mild noticeable jitteriness of the fingers bilaterally.  HENT:     Head: Normocephalic and atraumatic.  Eyes:     Extraocular Movements: Extraocular movements intact.     Comments: No noticeable nystagmus  Neck:     Musculoskeletal: Neck supple.  Cardiovascular:     Rate and Rhythm: Tachycardia present.     Heart sounds: Normal heart sounds. No murmur.  Pulmonary:     Breath sounds:  Normal breath sounds. No rhonchi or rales.  Abdominal:     General: Bowel sounds are normal.     Tenderness: There is no abdominal tenderness.  Musculoskeletal:     Right lower leg: No edema.     Left lower leg: No edema.  Neurological:     Mental Status: She is alert. She is disoriented.  Psychiatric:        Mood and Affect: Mood normal.        Behavior: Behavior normal.     Assessment & Plan:   See Encounters Tab for problem based charting.  Patient seen with Dr. Dareen Piano

## 2018-04-10 ENCOUNTER — Telehealth: Payer: Self-pay

## 2018-04-10 NOTE — Telephone Encounter (Signed)
Received TC from pt.  She states that she fell again this morning and hit her head.  Denies LOC, denies any CNS changes.  States her son was at home at helped her up from the fall and is insisting she did not get injured.  This RN instructed pt is she has any changes in vision, headaches, or other CNS disturbances to present to ED for evaluation, she verbalized understanding.  Pt asking about a medication she discussed with Dr. Eileen Stanford at [facility] visit yesterday that would help ringing in the ears and dizziness.  Pt states she can't remember name of medication, but states Dr Eileen Stanford was looking into it for her.  This nurse only see's note about possibly decreasing levothyroxine in MD's note, pt states it was another medication. Will forward to Dr. Eileen Stanford to advise. Thank you, SChaplin, RN,BSN

## 2018-04-10 NOTE — Progress Notes (Signed)
Internal Medicine Clinic Attending  I saw and evaluated the patient.  I personally confirmed the key portions of the history and exam documented by Dr. Agyei and I reviewed pertinent patient test results.  The assessment, diagnosis, and plan were formulated together and I agree with the documentation in the resident's note.  

## 2018-04-10 NOTE — Telephone Encounter (Signed)
Thank you. I don't know what medication she is referring to though.

## 2018-04-11 NOTE — Telephone Encounter (Signed)
Hi Stacee,   Thank you for the update. We did not discuss any medication that pertains to dizziness or ringing in her ears.   I would advice that she report to [facility] if she keep having the fall.

## 2018-04-14 ENCOUNTER — Telehealth: Payer: Self-pay | Admitting: Dietician

## 2018-04-14 NOTE — Telephone Encounter (Signed)
I could not find a DME company who can supply the Freestyle lbire for [name]. Would it be okay to look into obtaining the Dexcom G6 Continuous glucose monitoring for her?

## 2018-04-14 NOTE — Telephone Encounter (Signed)
Thank you for your help with this!

## 2018-04-14 NOTE — Telephone Encounter (Signed)
Spoke with patient about appointment cancellations. Answered her questions and concerns about diet and nutrition for diabetes.  She reports a fall this past Saturday for unknown reason. She also reports no injury except "shoulder is sore", blood sugars in 200s for no know reason, she does not reports symptoms of infection or fever and feels well. She reports taking her diabetes medicines,  good food and fluid intake. Made her aware that we are open and she can call for an appointment or questions as needed.  [name], RD 04/14/2018 11:22 AM.

## 2018-04-16 ENCOUNTER — Other Ambulatory Visit: Payer: Self-pay | Admitting: Dietician

## 2018-04-16 ENCOUNTER — Ambulatory Visit: Payer: Self-pay | Admitting: Dietician

## 2018-04-16 DIAGNOSIS — Z794 Long term (current) use of insulin: Principal | ICD-10-CM

## 2018-04-16 DIAGNOSIS — E1142 Type 2 diabetes mellitus with diabetic polyneuropathy: Secondary | ICD-10-CM

## 2018-04-16 MED ORDER — DEXCOM G6 SENSOR MISC: 1.0000 | Freq: Four times a day (QID) | 12 refills | Status: DC

## 2018-04-16 MED ORDER — DEXCOM G6 RECEIVER DEVI: 1.0000 | Freq: Four times a day (QID) | 0 refills | Status: DC

## 2018-04-16 MED ORDER — DEXCOM G6 TRANSMITTER MISC: 1.0000 | Freq: Four times a day (QID) | 3 refills | Status: DC

## 2018-04-16 NOTE — Telephone Encounter (Signed)
Request for Dexcom G6 CGM

## 2018-04-17 ENCOUNTER — Telehealth: Payer: Self-pay | Admitting: Internal Medicine

## 2018-04-17 NOTE — Telephone Encounter (Signed)
Thank you :)

## 2018-04-17 NOTE — Telephone Encounter (Signed)
Mickel Baas from [employer] need verbal orders [phone]

## 2018-04-17 NOTE — Telephone Encounter (Signed)
Lm for rtc 

## 2018-04-17 NOTE — Telephone Encounter (Signed)
Pt refused visit due to dog dying. Will reschedule for next week

## 2018-04-22 ENCOUNTER — Telehealth: Payer: Self-pay | Admitting: Licensed Clinical Social Worker

## 2018-04-22 NOTE — Telephone Encounter (Signed)
Patient was contacted to offer her a future appointment via telephone. Patient did not answer, and a voicemail was left for the patient to contact our office if she would like to schedule an appointment via telephone.

## 2018-04-23 DIAGNOSIS — H353220 Exudative age-related macular degeneration, left eye, stage unspecified: Secondary | ICD-10-CM | POA: Diagnosis not present

## 2018-04-23 DIAGNOSIS — I129 Hypertensive chronic kidney disease with stage 1 through stage 4 chronic kidney disease, or unspecified chronic kidney disease: Secondary | ICD-10-CM | POA: Diagnosis not present

## 2018-04-23 DIAGNOSIS — E039 Hypothyroidism, unspecified: Secondary | ICD-10-CM | POA: Diagnosis not present

## 2018-04-23 DIAGNOSIS — Z853 Personal history of malignant neoplasm of breast: Secondary | ICD-10-CM | POA: Diagnosis not present

## 2018-04-23 DIAGNOSIS — Z9221 Personal history of antineoplastic chemotherapy: Secondary | ICD-10-CM | POA: Diagnosis not present

## 2018-04-23 DIAGNOSIS — Z923 Personal history of irradiation: Secondary | ICD-10-CM | POA: Diagnosis not present

## 2018-04-23 DIAGNOSIS — K5710 Diverticulosis of small intestine without perforation or abscess without bleeding: Secondary | ICD-10-CM | POA: Diagnosis not present

## 2018-04-23 DIAGNOSIS — M1990 Unspecified osteoarthritis, unspecified site: Secondary | ICD-10-CM | POA: Diagnosis not present

## 2018-04-23 DIAGNOSIS — N132 Hydronephrosis with renal and ureteral calculous obstruction: Secondary | ICD-10-CM | POA: Diagnosis not present

## 2018-04-23 DIAGNOSIS — E1122 Type 2 diabetes mellitus with diabetic chronic kidney disease: Secondary | ICD-10-CM | POA: Diagnosis not present

## 2018-04-23 DIAGNOSIS — H04123 Dry eye syndrome of bilateral lacrimal glands: Secondary | ICD-10-CM | POA: Diagnosis not present

## 2018-04-23 DIAGNOSIS — Z9181 History of falling: Secondary | ICD-10-CM | POA: Diagnosis not present

## 2018-04-23 DIAGNOSIS — E1142 Type 2 diabetes mellitus with diabetic polyneuropathy: Secondary | ICD-10-CM | POA: Diagnosis not present

## 2018-04-23 DIAGNOSIS — I890 Lymphedema, not elsewhere classified: Secondary | ICD-10-CM | POA: Diagnosis not present

## 2018-04-23 DIAGNOSIS — Z6822 Body mass index (BMI) 22.0-22.9, adult: Secondary | ICD-10-CM | POA: Diagnosis not present

## 2018-04-23 DIAGNOSIS — Z794 Long term (current) use of insulin: Secondary | ICD-10-CM | POA: Diagnosis not present

## 2018-04-23 DIAGNOSIS — N183 Chronic kidney disease, stage 3 (moderate): Secondary | ICD-10-CM | POA: Diagnosis not present

## 2018-04-27 ENCOUNTER — Telehealth: Payer: Self-pay | Admitting: Dietician

## 2018-04-27 ENCOUNTER — Telehealth: Payer: Self-pay

## 2018-04-27 DIAGNOSIS — Z923 Personal history of irradiation: Secondary | ICD-10-CM | POA: Diagnosis not present

## 2018-04-27 DIAGNOSIS — H04123 Dry eye syndrome of bilateral lacrimal glands: Secondary | ICD-10-CM | POA: Diagnosis not present

## 2018-04-27 DIAGNOSIS — E1142 Type 2 diabetes mellitus with diabetic polyneuropathy: Secondary | ICD-10-CM | POA: Diagnosis not present

## 2018-04-27 DIAGNOSIS — Z853 Personal history of malignant neoplasm of breast: Secondary | ICD-10-CM | POA: Diagnosis not present

## 2018-04-27 DIAGNOSIS — I890 Lymphedema, not elsewhere classified: Secondary | ICD-10-CM | POA: Diagnosis not present

## 2018-04-27 DIAGNOSIS — M1990 Unspecified osteoarthritis, unspecified site: Secondary | ICD-10-CM | POA: Diagnosis not present

## 2018-04-27 DIAGNOSIS — H353220 Exudative age-related macular degeneration, left eye, stage unspecified: Secondary | ICD-10-CM | POA: Diagnosis not present

## 2018-04-27 DIAGNOSIS — K5710 Diverticulosis of small intestine without perforation or abscess without bleeding: Secondary | ICD-10-CM | POA: Diagnosis not present

## 2018-04-27 DIAGNOSIS — E039 Hypothyroidism, unspecified: Secondary | ICD-10-CM | POA: Diagnosis not present

## 2018-04-27 DIAGNOSIS — N132 Hydronephrosis with renal and ureteral calculous obstruction: Secondary | ICD-10-CM | POA: Diagnosis not present

## 2018-04-27 DIAGNOSIS — Z6822 Body mass index (BMI) 22.0-22.9, adult: Secondary | ICD-10-CM | POA: Diagnosis not present

## 2018-04-27 DIAGNOSIS — I129 Hypertensive chronic kidney disease with stage 1 through stage 4 chronic kidney disease, or unspecified chronic kidney disease: Secondary | ICD-10-CM | POA: Diagnosis not present

## 2018-04-27 DIAGNOSIS — Z9221 Personal history of antineoplastic chemotherapy: Secondary | ICD-10-CM | POA: Diagnosis not present

## 2018-04-27 DIAGNOSIS — Z9181 History of falling: Secondary | ICD-10-CM | POA: Diagnosis not present

## 2018-04-27 DIAGNOSIS — N183 Chronic kidney disease, stage 3 (moderate): Secondary | ICD-10-CM | POA: Diagnosis not present

## 2018-04-27 DIAGNOSIS — Z794 Long term (current) use of insulin: Secondary | ICD-10-CM | POA: Diagnosis not present

## 2018-04-27 DIAGNOSIS — E1122 Type 2 diabetes mellitus with diabetic chronic kidney disease: Secondary | ICD-10-CM | POA: Diagnosis not present

## 2018-04-27 NOTE — Telephone Encounter (Signed)
Mickel Baas with [facility] requesting VO for PT. Please call back.

## 2018-04-27 NOTE — Telephone Encounter (Signed)
[name] is a 68 y.o. female who was contacted on behalf of [facility] Geriatrics Task Force.   Diabetes Assessment  DM meds and BS checks -  "What medications are you taking for diabetes?"  - semaglutide-takes 0.5 every Sunday;  took it yesterday - Tresiba-16 units each night Humalog- 7 units for dinner last night, took 7-8 before her oatmeal this am for blood sugar of 307, knows she can take 5 for a small meal, 6 for a medium meal. -  "How often do you check your blood sugars at home?" 3x/day - "What have your blood sugars been?" 121/186/154 - 29th it was 102,  - 30th it was 307 at 848 am after low blood sugar, now 194  High Blood Pressure Assessment  BP meds & BP checks -  "What medications are you taking for high blood pressure?" hctz, ,amlodipine - "How often do you check your blood pressure at home?" no, the aide does 2 days a week - "What have your blood pressure readings been?" low a couple times  Coping with DM and BP - What else are you doing to help with your DM and BP - Diet? Water, juice or orange juice, 2% milk, bananas. orange, eats a little bit, son fixes greens, doesn't like his chicken, oatmeal, mashed potatoes. Son grocery shops for her. She feels they have enough food.  -  Exercise? Walking around the house. Aide takes her out  Medication Access Issues  Medication Issues? -  "Are you getting your medicines refilled on time without skipping any doses?" yes, using delivery - "Are you having any problems getting/taking your meds (cost, timing, transportation)?" no - Do you need any meds refilled? no  Conclusion  Close the call - Date of follow-up visit scheduled reveiwed - "Any other questions or concerns?"    Could have used a CGM today, a low blood sugar woke her in the "wee hours of the night"   "Woke my son up for sugar water, drank two glasses of sugar water then passed out or went to sleep I don't know". Woke up later. 858 am her blood sugar was 307   Was informed she  cannot get lyrica-  has to try generic first.  She has gotten calls from [facility] about a Continuous glucose monitor.  She agreed to call me when it is shipped. Debera Lat, RD 04/27/2018 2:17 PM.

## 2018-04-27 NOTE — Telephone Encounter (Signed)
I am unsure of what medication she needs adjusting. I don't see anything on her last progress note. Would continue with PT and have her schedule a telephone appointment with [facility] if she has persistent lightheadedness

## 2018-04-27 NOTE — Telephone Encounter (Signed)
Verbal auth given for [facility] PT 2 week 2 and 1 week 1. Will route to PCP for agreement/denial.   Mickel Baas also reporting patient had another fall 2/2 dizziness with turning head. Patient told her that doctor was going to adjust some medicine but hasn't heard anything. No injury with fall. Hubbard Hartshorn, RN, BSN

## 2018-04-27 NOTE — Telephone Encounter (Signed)
M, can you find out a little more about her hypoglycemia?

## 2018-04-27 NOTE — Telephone Encounter (Signed)
Returned call to Mickel Baas. No answer. Left message on VM requesting return call. Hubbard Hartshorn, RN, BSN

## 2018-04-27 NOTE — Telephone Encounter (Signed)
I agree

## 2018-04-28 ENCOUNTER — Ambulatory Visit: Payer: Medicare Other | Admitting: Internal Medicine

## 2018-04-28 ENCOUNTER — Other Ambulatory Visit: Payer: Self-pay

## 2018-04-28 DIAGNOSIS — R42 Dizziness and giddiness: Secondary | ICD-10-CM

## 2018-04-28 NOTE — Telephone Encounter (Signed)
Thank you :)

## 2018-04-28 NOTE — Telephone Encounter (Signed)
Why does she think she had the low blood sugar and if they repeated afterwards?

## 2018-04-28 NOTE — Telephone Encounter (Signed)
    Diabetes Assessment  DM meds and BS checks   "What medications are you taking for diabetes?"   semaglutide-takes 0.5 every Sunday;  took it yesterday  Tresiba-16 units each night Humalog- 7 units for dinner last night, took 7-8 before her oatmeal this am for blood sugar of 307, knows she can take 5 for a small meal, 6 for a medium meal.   "How often do you check your blood sugars at home?" 3x/day  "What have your blood sugars been?" 121/186/154  29th it was 102,   30th it was 307 at 848 am after low blood sugar, now 194

## 2018-04-28 NOTE — Assessment & Plan Note (Addendum)
  She has been having dizziness for since last November with ringing in her ears. This is worse when she moves her head and sometimes when she goes from sitting to standing. It is not present constantly but most of the time and she states her "head feels tight, as if her hair is sticky." She states she has fallen multiple times over the last few months but has had no LOC. She has home health for repeated falls. She was given meclizine several months ago but this did not help. She feels that she becomes tired very quickly and sometimes has a tremor. It is present today.  She denies numbness or tingling, nausea, changes in vision, headache, pain in ears, abdominal pain, hair loss, diarrhea, changes in BM. Denies chest pain or palpitations. She has been eating and drinking, denies changes in urination. She has lost 20lbs over the last two years. She is a&ox3 during our discussion.  November near when symptoms began, and MRI was negative for acute findings. She takes effexor 150 MG and trazadone 50 mg as needed at bedtime. Her synthroid was decreased from 100 mcg back to 88 mcg 3/12 due to concern for the dose being too high as it had recently been increased to this level the month before for TSH 15 and T4 of .55. She additionally has a history of orthostatic hypotension at initial presentation in November and at appt visit 3/12. Epley was done, and she was negative for BPPV at this appointment as well. She takes HCTZ 12.5 mg qd and norvasc 10 mg qd.  She checks her glucose daily and tries to keep it low but notes it rarely goes below 80. She states this feels different from when she has hypoglycemia.   Symptoms possibly secondary to orthostatic hypotension but are not consistent with this alone. Differential includes central vestibular dysfunction, adrenal insufficiency, hypothyroidism, other metabolic disorder, and difficulty with managing medications as this has been an issue in the past.  - stop blood  pressure medications today and f/u 4/3 for repeat orthostatics, BMP, TSH and T4 - if orthostatics continue after stopping bp medications consider workup for adrenal insufficiency with her history of weight loss, ongoing dizziness, and orthostatics - otherwise may need referral to neurology

## 2018-04-28 NOTE — Progress Notes (Signed)
   CC: dizzinses   HPI:  [name] is a 68 y.o. with PMH as below. This is a telephone encounter between Angus Palms and Marty Heck on 04/28/2018 for dizziness. The visit was conducted with the patient located at home and Marty Heck at [facility]. The patient's identity was confirmed using their DOB and current address. The patient has consented to being evaluated through a telephone encounter and understands the associated risks (an examination cannot be done and the patient may need to come in for an appointment) / benefits (allows the patient to remain at home, decreasing exposure to coronavirus). I personally spent 22 minutes on medical discussion.   She has been having dizziness for since last November with ringing in her ears. This is worse when she moves her head and sometimes when she goes from sitting to standing. It is not present constantly but most of the time and she states her "head feels tight, as if her hair is sticky." She states she has fallen multiple times over the last few months but has had no LOC. She was given meclizine several months ago but this did not help. She feels that she becomes tired very quickly and sometimes has a tremor. It is present today.  She denies numbness or tingling, nausea, changes in vision, headache, pain in ears, abdominal pain, hair loss, diarrhea, changes in BM. Denies chest pain or palpitations. She has been eating and drinking, denies changes in urination  Please see A&P for assessment of the patient's acute and chronic medical conditions.    Past Medical History:  Diagnosis Date  . Arthritis   . Borderline glaucoma of both eyes   . Depression   . Diabetic polyneuropathy ([place])   . Diverticulosis of colon   . Dry eyes, bilateral   . Feeling of incomplete bladder emptying   . History of breast cancer oncologist-  dr Waymon Budge-- per lov note no recurrence   dx 04/ 1999 --- Stage 3B-- s/p  chemotherapy then right mastectomy then  concurrent chemoradiation therapy  . History of urinary retention    01/ 2018  . Hydronephrosis of right kidney   . Hyperlipidemia   . Hypertension   . Hypothyroidism   . Insulin dependent type 2 diabetes mellitus [facility])    endocrinologist-  dr Cruzita Lederer---  last A1c 8.7 on 02-20-2016  . Lymphedema of upper extremity    right  . Macular degeneration, left eye    followed by dr Zigmund Daniel  . Renal insufficiency   . Urgency of urination    Review of Systems:   ROS as noted in HPI, otherwise negative   Physical Exam: Unable to obtain due to telephone visit  There were no vitals filed for this visit. Telephone visit.   Assessment & Plan:   See Encounters Tab for problem based charting.  Patient discussed with Dr. Evette Doffing

## 2018-04-28 NOTE — Telephone Encounter (Signed)
Called pt - stated she cannot remember which medication to be adjusted and dizziness continues. Telehealth visit scheduled for today; pt instructed to be home and /or have her phone nearby between 1- 3 PM. Voice understanding.

## 2018-04-29 DIAGNOSIS — Z9221 Personal history of antineoplastic chemotherapy: Secondary | ICD-10-CM | POA: Diagnosis not present

## 2018-04-29 DIAGNOSIS — N183 Chronic kidney disease, stage 3 (moderate): Secondary | ICD-10-CM | POA: Diagnosis not present

## 2018-04-29 DIAGNOSIS — N132 Hydronephrosis with renal and ureteral calculous obstruction: Secondary | ICD-10-CM | POA: Diagnosis not present

## 2018-04-29 DIAGNOSIS — E1122 Type 2 diabetes mellitus with diabetic chronic kidney disease: Secondary | ICD-10-CM | POA: Diagnosis not present

## 2018-04-29 DIAGNOSIS — I129 Hypertensive chronic kidney disease with stage 1 through stage 4 chronic kidney disease, or unspecified chronic kidney disease: Secondary | ICD-10-CM | POA: Diagnosis not present

## 2018-04-29 DIAGNOSIS — H04123 Dry eye syndrome of bilateral lacrimal glands: Secondary | ICD-10-CM | POA: Diagnosis not present

## 2018-04-29 DIAGNOSIS — E039 Hypothyroidism, unspecified: Secondary | ICD-10-CM | POA: Diagnosis not present

## 2018-04-29 DIAGNOSIS — Z923 Personal history of irradiation: Secondary | ICD-10-CM | POA: Diagnosis not present

## 2018-04-29 DIAGNOSIS — H353220 Exudative age-related macular degeneration, left eye, stage unspecified: Secondary | ICD-10-CM | POA: Diagnosis not present

## 2018-04-29 DIAGNOSIS — Z853 Personal history of malignant neoplasm of breast: Secondary | ICD-10-CM | POA: Diagnosis not present

## 2018-04-29 DIAGNOSIS — E1142 Type 2 diabetes mellitus with diabetic polyneuropathy: Secondary | ICD-10-CM | POA: Diagnosis not present

## 2018-04-29 DIAGNOSIS — I890 Lymphedema, not elsewhere classified: Secondary | ICD-10-CM | POA: Diagnosis not present

## 2018-04-29 DIAGNOSIS — Z6822 Body mass index (BMI) 22.0-22.9, adult: Secondary | ICD-10-CM | POA: Diagnosis not present

## 2018-04-29 DIAGNOSIS — Z9181 History of falling: Secondary | ICD-10-CM | POA: Diagnosis not present

## 2018-04-29 DIAGNOSIS — K5710 Diverticulosis of small intestine without perforation or abscess without bleeding: Secondary | ICD-10-CM | POA: Diagnosis not present

## 2018-04-29 DIAGNOSIS — M1990 Unspecified osteoarthritis, unspecified site: Secondary | ICD-10-CM | POA: Diagnosis not present

## 2018-04-29 DIAGNOSIS — Z794 Long term (current) use of insulin: Secondary | ICD-10-CM | POA: Diagnosis not present

## 2018-04-29 NOTE — Progress Notes (Signed)
Internal Medicine Clinic Attending  Case discussed with Dr. Seawell at the time of the visit.  We reviewed the resident's history and exam and pertinent patient test results.  I agree with the assessment, diagnosis, and plan of care documented in the resident's note.    

## 2018-05-01 ENCOUNTER — Encounter: Payer: Self-pay | Admitting: Internal Medicine

## 2018-05-01 ENCOUNTER — Other Ambulatory Visit: Payer: Self-pay

## 2018-05-01 ENCOUNTER — Ambulatory Visit (INDEPENDENT_AMBULATORY_CARE_PROVIDER_SITE_OTHER): Payer: Medicare Other | Admitting: Internal Medicine

## 2018-05-01 DIAGNOSIS — Z79890 Hormone replacement therapy (postmenopausal): Secondary | ICD-10-CM

## 2018-05-01 DIAGNOSIS — R42 Dizziness and giddiness: Secondary | ICD-10-CM

## 2018-05-01 DIAGNOSIS — E039 Hypothyroidism, unspecified: Secondary | ICD-10-CM

## 2018-05-01 DIAGNOSIS — I951 Orthostatic hypotension: Secondary | ICD-10-CM | POA: Diagnosis not present

## 2018-05-01 NOTE — Assessment & Plan Note (Signed)
Orthostatic positive on 3/12 received IV fluids in the clinic, Epley maneuver performed and negative for signs of BPPV, additionally synthroid dosage lowered but no change to bp measurements at that time.  Telephone encounter on 3/31 for dizziness at that time pt told to stop all bp medicines.  MRI in November when dizziness began which was negative.    Medicine in bubble packs so has not stopped her antihypertensives.  [facility].  would like to switch to [facility] because they deliver and she gets more information printed out about each medication.    Dizziness worse when getting up to walk and when she goes to sit back down.  Feels thirsty today, somewhat dehydrated.  Sitting in the chair and talking with me she does not feel dizzy.  She denies any other associated symptoms other than when taking meclizine she felt she developed a rash on her head and neck.  She also reports she was taking biotin about a month ago but was only on it for a week and a half.  She is mildly orthostatic again today as well by orthostatic vitals.  I feel her exam and history are most consistent with orthostatic hypotension, BPPV remains in the differential as does neuropathy and patient also on mutliple Beer's list centrally acting meds.    -best first place to start is to get her off antihypertensive therapy and see how she responds.  I have already coordinated changing her medications to [facility] and subtracting out the antihypertensives.   -She is already working with physical therapy at home this is great, continue this -If she still doesn't respond after this would consider subtracting centrally acting meds as able -I do not feel her hypothyroidism has anything to do with this and would recommend letting one person manage her synthroid dosage

## 2018-05-01 NOTE — Progress Notes (Signed)
CC: dizziness, orthostatic hypotension,   HPI:  [name] is a 68 y.o. female with PMH below.  Today we will address dizziness, orthostatic hypotension  Please see A&P for status of the patient's chronic medical conditions  Past Medical History:  Diagnosis Date  . Arthritis   . Borderline glaucoma of both eyes   . Depression   . Diabetic polyneuropathy ([place])   . Diverticulosis of colon   . Dry eyes, bilateral   . Feeling of incomplete bladder emptying   . History of breast cancer oncologist-  dr Waymon Budge-- per lov note no recurrence   dx 04/ 1999 --- Stage 3B-- s/p  chemotherapy then right mastectomy then concurrent chemoradiation therapy  . History of urinary retention    01/ 2018  . Hydronephrosis of right kidney   . Hyperlipidemia   . Hypertension   . Hypothyroidism   . Insulin dependent type 2 diabetes mellitus [facility])    endocrinologist-  dr Cruzita Lederer---  last A1c 8.7 on 02-20-2016  . Lymphedema of upper extremity    right  . Macular degeneration, left eye    followed by dr Zigmund Daniel  . Renal insufficiency   . Urgency of urination    Review of Systems:  ROS: Pulmonary: pt denies increased work of breathing, shortness of breath,  Cardiac: pt denies palpitations, chest pain,  Abdominal: pt denies abdominal pain, nausea, vomiting, or diarrhea   Physical Exam:  Vitals:   05/01/18 1132  BP: 114/72  Pulse: 93  Temp: 98.1 F (36.7 C)  TempSrc: Oral  SpO2: 100%  Weight: 121 lb 11.2 oz (55.2 kg)  Height: 5\' 2"  (1.575 m)   Cardiac: normal rate and rhythm, clear s1, loud P2 appreciated, no murmurs, rubs or gallops Neck: supple, normal sized thyroid appreciated, no bruits Pulmonary: CTAB, not in distress Abdominal: non distended abdomen, soft and nontender Extremities: no LE edema Psych: Alert, conversant, in good spirits   Social History   Socioeconomic History  . Marital status: Widowed    Spouse name: Not on file  . Number of children: Not on  file  . Years of education: Not on file  . Highest education level: Not on file  Occupational History  . Not on file  Social Needs  . Financial resource strain: Not on file  . Food insecurity:    Worry: Not on file    Inability: Not on file  . Transportation needs:    Medical: Not on file    Non-medical: Not on file  Tobacco Use  . Smoking status: Never Smoker  . Smokeless tobacco: Never Used  Substance and Sexual Activity  . Alcohol use: No    Alcohol/week: 0.0 standard drinks  . Drug use: No  . Sexual activity: Not on file  Lifestyle  . Physical activity:    Days per week: Not on file    Minutes per session: Not on file  . Stress: Not on file  Relationships  . Social connections:    Talks on phone: Not on file    Gets together: Not on file    Attends religious service: Not on file    Active member of club or organization: Not on file    Attends meetings of clubs or organizations: Not on file    Relationship status: Not on file  . Intimate partner violence:    Fear of current or ex partner: Not on file    Emotionally abused: Not on file    Physically abused: Not  on file    Forced sexual activity: Not on file  Other Topics Concern  . Not on file  Social History Narrative  . Not on file    Family History  Problem Relation Age of Onset  . Heart disease Father   . Diabetes Father   . Stroke Sister   . Anuerysm Sister 9       brain; maternal half-sister  . Heart disease Brother   . Anuerysm Brother 9       aortic; maternal half-brother  . Breast cancer Daughter 32       negative genetic testing in 2015  . Heart attack Daughter        68-47  . Diabetes Paternal Grandmother   . Stroke Paternal Grandfather   . Anuerysm Brother        NOS type; full brother  . Fibroids Daughter        s/p hysterectomy at 62y  . Brain cancer Maternal Uncle        dx. older than 89; NOS type  . Brain cancer Cousin        maternal 1st cousin; d. early 41s; NOS type  .  Deafness Paternal Uncle        prelingual    Assessment & Plan:   See Encounters Tab for problem based charting.  Patient discussed with Dr. Lynnae January

## 2018-05-01 NOTE — Patient Instructions (Addendum)
[name], we have transferred your prescriptions to [facility] as requested and have stopped your amlodipine and hydrochlorothiazide for the time being.  We are hopeful that this will help with your dizziness.  You should notice a difference within a few days.  Please try and stay well hydrated, get up from a seated position slowly, and sit down slowly.  When we are able we will try to get you set up with physical therapy which can often be a big help with working with you on ways to help you feel less dizzy.

## 2018-05-01 NOTE — Progress Notes (Signed)
Internal Medicine Clinic Attending  Case discussed with Dr. Winfrey  at the time of the visit.  We reviewed the resident's history and exam and pertinent patient test results.  I agree with the assessment, diagnosis, and plan of care documented in the resident's note.  

## 2018-05-02 LAB — BMP8+ANION GAP
Anion Gap: 21 mmol/L — ABNORMAL HIGH (ref 10.0–18.0)
BUN/Creatinine Ratio: 15 (ref 12–28)
BUN: 16 mg/dL (ref 8–27)
CO2: 23 mmol/L (ref 20–29)
Calcium: 9.8 mg/dL (ref 8.7–10.3)
Chloride: 97 mmol/L (ref 96–106)
Creatinine, Ser: 1.1 mg/dL — ABNORMAL HIGH (ref 0.57–1.00)
GFR calc Af Amer: 60 mL/min/{1.73_m2} (ref >59)
GFR calc non Af Amer: 52 mL/min/{1.73_m2} — ABNORMAL LOW (ref >59)
Glucose: 224 mg/dL — ABNORMAL HIGH (ref 65–99)
Potassium: 4.3 mmol/L (ref 3.5–5.2)
Sodium: 141 mmol/L (ref 134–144)

## 2018-05-02 LAB — TSH: TSH: 12.63 u[IU]/mL — ABNORMAL HIGH (ref 0.450–4.500)

## 2018-05-02 LAB — T4, FREE: Free T4: 0.88 ng/dL (ref 0.82–1.77)

## 2018-05-05 DIAGNOSIS — E039 Hypothyroidism, unspecified: Secondary | ICD-10-CM | POA: Diagnosis not present

## 2018-05-05 DIAGNOSIS — N132 Hydronephrosis with renal and ureteral calculous obstruction: Secondary | ICD-10-CM | POA: Diagnosis not present

## 2018-05-05 DIAGNOSIS — Z923 Personal history of irradiation: Secondary | ICD-10-CM | POA: Diagnosis not present

## 2018-05-05 DIAGNOSIS — E1122 Type 2 diabetes mellitus with diabetic chronic kidney disease: Secondary | ICD-10-CM | POA: Diagnosis not present

## 2018-05-05 DIAGNOSIS — N183 Chronic kidney disease, stage 3 (moderate): Secondary | ICD-10-CM | POA: Diagnosis not present

## 2018-05-05 DIAGNOSIS — H04123 Dry eye syndrome of bilateral lacrimal glands: Secondary | ICD-10-CM | POA: Diagnosis not present

## 2018-05-05 DIAGNOSIS — Z9221 Personal history of antineoplastic chemotherapy: Secondary | ICD-10-CM | POA: Diagnosis not present

## 2018-05-05 DIAGNOSIS — Z794 Long term (current) use of insulin: Secondary | ICD-10-CM | POA: Diagnosis not present

## 2018-05-05 DIAGNOSIS — E1142 Type 2 diabetes mellitus with diabetic polyneuropathy: Secondary | ICD-10-CM | POA: Diagnosis not present

## 2018-05-05 DIAGNOSIS — Z853 Personal history of malignant neoplasm of breast: Secondary | ICD-10-CM | POA: Diagnosis not present

## 2018-05-05 DIAGNOSIS — I129 Hypertensive chronic kidney disease with stage 1 through stage 4 chronic kidney disease, or unspecified chronic kidney disease: Secondary | ICD-10-CM | POA: Diagnosis not present

## 2018-05-05 DIAGNOSIS — H353220 Exudative age-related macular degeneration, left eye, stage unspecified: Secondary | ICD-10-CM | POA: Diagnosis not present

## 2018-05-05 DIAGNOSIS — I890 Lymphedema, not elsewhere classified: Secondary | ICD-10-CM | POA: Diagnosis not present

## 2018-05-05 DIAGNOSIS — Z6822 Body mass index (BMI) 22.0-22.9, adult: Secondary | ICD-10-CM | POA: Diagnosis not present

## 2018-05-05 DIAGNOSIS — K5710 Diverticulosis of small intestine without perforation or abscess without bleeding: Secondary | ICD-10-CM | POA: Diagnosis not present

## 2018-05-05 DIAGNOSIS — M1990 Unspecified osteoarthritis, unspecified site: Secondary | ICD-10-CM | POA: Diagnosis not present

## 2018-05-05 DIAGNOSIS — Z9181 History of falling: Secondary | ICD-10-CM | POA: Diagnosis not present

## 2018-05-06 ENCOUNTER — Ambulatory Visit: Payer: Medicare Other | Admitting: Podiatry

## 2018-05-07 ENCOUNTER — Telehealth: Payer: Self-pay

## 2018-05-07 DIAGNOSIS — I129 Hypertensive chronic kidney disease with stage 1 through stage 4 chronic kidney disease, or unspecified chronic kidney disease: Secondary | ICD-10-CM | POA: Diagnosis not present

## 2018-05-07 DIAGNOSIS — H04123 Dry eye syndrome of bilateral lacrimal glands: Secondary | ICD-10-CM | POA: Diagnosis not present

## 2018-05-07 DIAGNOSIS — M1990 Unspecified osteoarthritis, unspecified site: Secondary | ICD-10-CM | POA: Diagnosis not present

## 2018-05-07 DIAGNOSIS — E039 Hypothyroidism, unspecified: Secondary | ICD-10-CM | POA: Diagnosis not present

## 2018-05-07 DIAGNOSIS — N183 Chronic kidney disease, stage 3 (moderate): Secondary | ICD-10-CM | POA: Diagnosis not present

## 2018-05-07 DIAGNOSIS — Z853 Personal history of malignant neoplasm of breast: Secondary | ICD-10-CM | POA: Diagnosis not present

## 2018-05-07 DIAGNOSIS — Z923 Personal history of irradiation: Secondary | ICD-10-CM | POA: Diagnosis not present

## 2018-05-07 DIAGNOSIS — Z9221 Personal history of antineoplastic chemotherapy: Secondary | ICD-10-CM | POA: Diagnosis not present

## 2018-05-07 DIAGNOSIS — H353220 Exudative age-related macular degeneration, left eye, stage unspecified: Secondary | ICD-10-CM | POA: Diagnosis not present

## 2018-05-07 DIAGNOSIS — E1122 Type 2 diabetes mellitus with diabetic chronic kidney disease: Secondary | ICD-10-CM | POA: Diagnosis not present

## 2018-05-07 DIAGNOSIS — Z6822 Body mass index (BMI) 22.0-22.9, adult: Secondary | ICD-10-CM | POA: Diagnosis not present

## 2018-05-07 DIAGNOSIS — N132 Hydronephrosis with renal and ureteral calculous obstruction: Secondary | ICD-10-CM | POA: Diagnosis not present

## 2018-05-07 DIAGNOSIS — Z794 Long term (current) use of insulin: Secondary | ICD-10-CM | POA: Diagnosis not present

## 2018-05-07 DIAGNOSIS — E1142 Type 2 diabetes mellitus with diabetic polyneuropathy: Secondary | ICD-10-CM | POA: Diagnosis not present

## 2018-05-07 DIAGNOSIS — I890 Lymphedema, not elsewhere classified: Secondary | ICD-10-CM | POA: Diagnosis not present

## 2018-05-07 DIAGNOSIS — Z9181 History of falling: Secondary | ICD-10-CM | POA: Diagnosis not present

## 2018-05-07 DIAGNOSIS — K5710 Diverticulosis of small intestine without perforation or abscess without bleeding: Secondary | ICD-10-CM | POA: Diagnosis not present

## 2018-05-07 NOTE — Telephone Encounter (Signed)
Requesting lab results. Please call pt back.  

## 2018-05-11 NOTE — Telephone Encounter (Signed)
spoke with pt about results, no changes to medication at this time, she has close follow up with Dr. Renne Crigler.

## 2018-05-12 ENCOUNTER — Telehealth: Payer: Self-pay | Admitting: Dietician

## 2018-05-12 NOTE — Telephone Encounter (Signed)
[facility] called Ms Bloxom about a Dexcom CGM.  she would like me to call them back. Ask to speak to Dan/Mr. Leiman.[place] [phone]).    Per [facility] her insurance Web designer) requires preferred [place] [phone]. I called [place]. They will do an insurance investigation call Ms Bleich and if she agrees. Contact our office for more information. Ms. Uncapher was notified.

## 2018-05-13 DIAGNOSIS — Z6822 Body mass index (BMI) 22.0-22.9, adult: Secondary | ICD-10-CM | POA: Diagnosis not present

## 2018-05-13 DIAGNOSIS — Z794 Long term (current) use of insulin: Secondary | ICD-10-CM | POA: Diagnosis not present

## 2018-05-13 DIAGNOSIS — N132 Hydronephrosis with renal and ureteral calculous obstruction: Secondary | ICD-10-CM | POA: Diagnosis not present

## 2018-05-13 DIAGNOSIS — N183 Chronic kidney disease, stage 3 (moderate): Secondary | ICD-10-CM | POA: Diagnosis not present

## 2018-05-13 DIAGNOSIS — Z853 Personal history of malignant neoplasm of breast: Secondary | ICD-10-CM | POA: Diagnosis not present

## 2018-05-13 DIAGNOSIS — Z923 Personal history of irradiation: Secondary | ICD-10-CM | POA: Diagnosis not present

## 2018-05-13 DIAGNOSIS — E1122 Type 2 diabetes mellitus with diabetic chronic kidney disease: Secondary | ICD-10-CM | POA: Diagnosis not present

## 2018-05-13 DIAGNOSIS — I129 Hypertensive chronic kidney disease with stage 1 through stage 4 chronic kidney disease, or unspecified chronic kidney disease: Secondary | ICD-10-CM | POA: Diagnosis not present

## 2018-05-13 DIAGNOSIS — E1142 Type 2 diabetes mellitus with diabetic polyneuropathy: Secondary | ICD-10-CM | POA: Diagnosis not present

## 2018-05-13 DIAGNOSIS — M1990 Unspecified osteoarthritis, unspecified site: Secondary | ICD-10-CM | POA: Diagnosis not present

## 2018-05-13 DIAGNOSIS — K5710 Diverticulosis of small intestine without perforation or abscess without bleeding: Secondary | ICD-10-CM | POA: Diagnosis not present

## 2018-05-13 DIAGNOSIS — Z9221 Personal history of antineoplastic chemotherapy: Secondary | ICD-10-CM | POA: Diagnosis not present

## 2018-05-13 DIAGNOSIS — H04123 Dry eye syndrome of bilateral lacrimal glands: Secondary | ICD-10-CM | POA: Diagnosis not present

## 2018-05-13 DIAGNOSIS — E039 Hypothyroidism, unspecified: Secondary | ICD-10-CM | POA: Diagnosis not present

## 2018-05-13 DIAGNOSIS — I890 Lymphedema, not elsewhere classified: Secondary | ICD-10-CM | POA: Diagnosis not present

## 2018-05-13 DIAGNOSIS — H353220 Exudative age-related macular degeneration, left eye, stage unspecified: Secondary | ICD-10-CM | POA: Diagnosis not present

## 2018-05-13 DIAGNOSIS — Z9181 History of falling: Secondary | ICD-10-CM | POA: Diagnosis not present

## 2018-05-20 DIAGNOSIS — N132 Hydronephrosis with renal and ureteral calculous obstruction: Secondary | ICD-10-CM | POA: Diagnosis not present

## 2018-05-20 DIAGNOSIS — E1142 Type 2 diabetes mellitus with diabetic polyneuropathy: Secondary | ICD-10-CM | POA: Diagnosis not present

## 2018-05-20 DIAGNOSIS — Z9221 Personal history of antineoplastic chemotherapy: Secondary | ICD-10-CM | POA: Diagnosis not present

## 2018-05-20 DIAGNOSIS — H04123 Dry eye syndrome of bilateral lacrimal glands: Secondary | ICD-10-CM | POA: Diagnosis not present

## 2018-05-20 DIAGNOSIS — E039 Hypothyroidism, unspecified: Secondary | ICD-10-CM | POA: Diagnosis not present

## 2018-05-20 DIAGNOSIS — K5710 Diverticulosis of small intestine without perforation or abscess without bleeding: Secondary | ICD-10-CM | POA: Diagnosis not present

## 2018-05-20 DIAGNOSIS — Z6822 Body mass index (BMI) 22.0-22.9, adult: Secondary | ICD-10-CM | POA: Diagnosis not present

## 2018-05-20 DIAGNOSIS — Z853 Personal history of malignant neoplasm of breast: Secondary | ICD-10-CM | POA: Diagnosis not present

## 2018-05-20 DIAGNOSIS — N183 Chronic kidney disease, stage 3 (moderate): Secondary | ICD-10-CM | POA: Diagnosis not present

## 2018-05-20 DIAGNOSIS — Z9181 History of falling: Secondary | ICD-10-CM | POA: Diagnosis not present

## 2018-05-20 DIAGNOSIS — Z794 Long term (current) use of insulin: Secondary | ICD-10-CM | POA: Diagnosis not present

## 2018-05-20 DIAGNOSIS — M1990 Unspecified osteoarthritis, unspecified site: Secondary | ICD-10-CM | POA: Diagnosis not present

## 2018-05-20 DIAGNOSIS — Z923 Personal history of irradiation: Secondary | ICD-10-CM | POA: Diagnosis not present

## 2018-05-20 DIAGNOSIS — I129 Hypertensive chronic kidney disease with stage 1 through stage 4 chronic kidney disease, or unspecified chronic kidney disease: Secondary | ICD-10-CM | POA: Diagnosis not present

## 2018-05-20 DIAGNOSIS — E1122 Type 2 diabetes mellitus with diabetic chronic kidney disease: Secondary | ICD-10-CM | POA: Diagnosis not present

## 2018-05-20 DIAGNOSIS — I890 Lymphedema, not elsewhere classified: Secondary | ICD-10-CM | POA: Diagnosis not present

## 2018-05-20 DIAGNOSIS — H353220 Exudative age-related macular degeneration, left eye, stage unspecified: Secondary | ICD-10-CM | POA: Diagnosis not present

## 2018-05-21 ENCOUNTER — Telehealth: Payer: Self-pay | Admitting: Internal Medicine

## 2018-05-21 NOTE — Telephone Encounter (Signed)
I agree

## 2018-05-21 NOTE — Telephone Encounter (Signed)
1x week for 4 weeks, for safety, ocular motor exercises for dizziness, balance., VO given, do you agree?

## 2018-05-21 NOTE — Telephone Encounter (Signed)
PT requesting VO from [facility].

## 2018-05-27 ENCOUNTER — Other Ambulatory Visit: Payer: Self-pay

## 2018-05-27 DIAGNOSIS — F32A Depression, unspecified: Secondary | ICD-10-CM

## 2018-05-27 DIAGNOSIS — E1142 Type 2 diabetes mellitus with diabetic polyneuropathy: Secondary | ICD-10-CM

## 2018-05-27 DIAGNOSIS — F329 Major depressive disorder, single episode, unspecified: Secondary | ICD-10-CM

## 2018-05-27 DIAGNOSIS — Z794 Long term (current) use of insulin: Secondary | ICD-10-CM

## 2018-05-27 MED ORDER — BUSPIRONE HCL 5 MG PO TABS: 5.0000 mg | ORAL_TABLET | Freq: Two times a day (BID) | ORAL | 0 refills | Status: DC

## 2018-05-27 MED ORDER — ONETOUCH DELICA LANCETS 33G MISC: 3 refills | Status: DC

## 2018-05-27 MED ORDER — INSULIN LISPRO (1 UNIT DIAL) 100 UNIT/ML (KWIKPEN): 5.0000 [IU] | PEN_INJECTOR | Freq: Three times a day (TID) | SUBCUTANEOUS | 5 refills | Status: DC

## 2018-05-27 MED ORDER — VENLAFAXINE HCL ER 150 MG PO CP24: ORAL_CAPSULE | ORAL | 0 refills | Status: DC

## 2018-05-27 MED ORDER — INSULIN DEGLUDEC 100 UNIT/ML ~~LOC~~ SOPN: 16.0000 [IU] | PEN_INJECTOR | Freq: Every day | SUBCUTANEOUS | 3 refills | Status: DC

## 2018-05-27 MED ORDER — SEMAGLUTIDE(0.25 OR 0.5MG/DOS) 2 MG/1.5ML ~~LOC~~ SOPN: 0.5000 mg | PEN_INJECTOR | SUBCUTANEOUS | 5 refills | Status: DC

## 2018-05-27 MED ORDER — INSULIN PEN NEEDLE 32G X 4 MM MISC: 6 refills | Status: DC

## 2018-05-27 NOTE — Telephone Encounter (Signed)
Requesting all meds to be filled @  [place], [place] - [address] [phone] (Phone) [phone] (Fax)

## 2018-06-03 DIAGNOSIS — Z9221 Personal history of antineoplastic chemotherapy: Secondary | ICD-10-CM | POA: Diagnosis not present

## 2018-06-03 DIAGNOSIS — I890 Lymphedema, not elsewhere classified: Secondary | ICD-10-CM | POA: Diagnosis not present

## 2018-06-03 DIAGNOSIS — Z9181 History of falling: Secondary | ICD-10-CM | POA: Diagnosis not present

## 2018-06-03 DIAGNOSIS — I129 Hypertensive chronic kidney disease with stage 1 through stage 4 chronic kidney disease, or unspecified chronic kidney disease: Secondary | ICD-10-CM | POA: Diagnosis not present

## 2018-06-03 DIAGNOSIS — M1990 Unspecified osteoarthritis, unspecified site: Secondary | ICD-10-CM | POA: Diagnosis not present

## 2018-06-03 DIAGNOSIS — Z923 Personal history of irradiation: Secondary | ICD-10-CM | POA: Diagnosis not present

## 2018-06-03 DIAGNOSIS — K5710 Diverticulosis of small intestine without perforation or abscess without bleeding: Secondary | ICD-10-CM | POA: Diagnosis not present

## 2018-06-03 DIAGNOSIS — N132 Hydronephrosis with renal and ureteral calculous obstruction: Secondary | ICD-10-CM | POA: Diagnosis not present

## 2018-06-03 DIAGNOSIS — Z6822 Body mass index (BMI) 22.0-22.9, adult: Secondary | ICD-10-CM | POA: Diagnosis not present

## 2018-06-03 DIAGNOSIS — E1142 Type 2 diabetes mellitus with diabetic polyneuropathy: Secondary | ICD-10-CM | POA: Diagnosis not present

## 2018-06-03 DIAGNOSIS — E1122 Type 2 diabetes mellitus with diabetic chronic kidney disease: Secondary | ICD-10-CM | POA: Diagnosis not present

## 2018-06-03 DIAGNOSIS — H04123 Dry eye syndrome of bilateral lacrimal glands: Secondary | ICD-10-CM | POA: Diagnosis not present

## 2018-06-03 DIAGNOSIS — Z853 Personal history of malignant neoplasm of breast: Secondary | ICD-10-CM | POA: Diagnosis not present

## 2018-06-03 DIAGNOSIS — Z794 Long term (current) use of insulin: Secondary | ICD-10-CM | POA: Diagnosis not present

## 2018-06-03 DIAGNOSIS — N183 Chronic kidney disease, stage 3 (moderate): Secondary | ICD-10-CM | POA: Diagnosis not present

## 2018-06-03 DIAGNOSIS — H353220 Exudative age-related macular degeneration, left eye, stage unspecified: Secondary | ICD-10-CM | POA: Diagnosis not present

## 2018-06-03 DIAGNOSIS — E039 Hypothyroidism, unspecified: Secondary | ICD-10-CM | POA: Diagnosis not present

## 2018-06-05 ENCOUNTER — Encounter: Payer: Self-pay | Admitting: Internal Medicine

## 2018-06-05 ENCOUNTER — Encounter: Payer: Medicare Other | Admitting: Internal Medicine

## 2018-06-09 ENCOUNTER — Other Ambulatory Visit: Payer: Self-pay

## 2018-06-09 ENCOUNTER — Ambulatory Visit (INDEPENDENT_AMBULATORY_CARE_PROVIDER_SITE_OTHER): Payer: Medicare Other | Admitting: Internal Medicine

## 2018-06-09 DIAGNOSIS — G629 Polyneuropathy, unspecified: Secondary | ICD-10-CM

## 2018-06-09 DIAGNOSIS — E039 Hypothyroidism, unspecified: Secondary | ICD-10-CM

## 2018-06-09 DIAGNOSIS — I10 Essential (primary) hypertension: Secondary | ICD-10-CM

## 2018-06-09 DIAGNOSIS — E1142 Type 2 diabetes mellitus with diabetic polyneuropathy: Secondary | ICD-10-CM | POA: Diagnosis not present

## 2018-06-09 DIAGNOSIS — F329 Major depressive disorder, single episode, unspecified: Secondary | ICD-10-CM

## 2018-06-09 DIAGNOSIS — N183 Chronic kidney disease, stage 3 unspecified: Secondary | ICD-10-CM

## 2018-06-09 DIAGNOSIS — R42 Dizziness and giddiness: Secondary | ICD-10-CM

## 2018-06-09 DIAGNOSIS — F32A Depression, unspecified: Secondary | ICD-10-CM

## 2018-06-09 DIAGNOSIS — Z794 Long term (current) use of insulin: Secondary | ICD-10-CM

## 2018-06-09 MED ORDER — GLUCOSE BLOOD VI STRP: ORAL_STRIP | 12 refills | Status: DC

## 2018-06-09 NOTE — Assessment & Plan Note (Signed)
-  Patient still complains of persistent lightheadedness on standing and states that it improves on sitting down -She has been seen in the clinic for this recently (most recent visit on April 3) -She was noted to be mildly orthostatic at that visit -At that time her blood pressure medications were discontinued -given the persistent nature of her symptoms and her recent falls (2 last week) we will bring her in for an in person appointment for further evaluation -No further work-up at this time

## 2018-06-09 NOTE — Progress Notes (Signed)
   Subjective:    Patient ID: [name], female    DOB: 1950-11-06, 68 y.o.   MRN: [id number]  Internal Medicine Clinic Attending  This is a telephone encounter between [name] and Devell Parkerson on 06/09/2018 for diabetes and hypothyroidism. The visit was conducted with the patient located at home and Aldine Contes at [facility]. The patient's identity was confirmed using their DOB and current address. The patient has consented to being evaluated through a telephone encounter and understands the associated risks (an examination cannot be done and the patient may need to come in for an appointment) / benefits (allows the patient to remain at home, decreasing exposure to coronavirus). I personally spent 11 minutes on medical discussion.    HPI  This is a telephone visit to follow-up on the patient's hypertension and diabetes.  Patient states that she is compliant with all her home medications.  She does state that she fell twice last week and has persistent lightheadedness on standing.  She also states that a couple weeks ago she noted her blood sugar was down to 29.  She was supposed to have a telephone visit with Dr. Renne Crigler last week but states that no one called her.  She denies any other complaints at this time.  Review of Systems  Constitutional: Negative.   HENT: Negative.   Respiratory: Negative.   Cardiovascular: Negative.   Gastrointestinal: Negative.   Musculoskeletal: Negative.   Neurological: Positive for dizziness.  Psychiatric/Behavioral: Negative.        Objective:   Physical Exam  This is a telephone visit and as such vitals were not recorded and physical exam was not done      Assessment & Plan:  Please see problem based charting for assessment and plan:

## 2018-06-09 NOTE — Assessment & Plan Note (Signed)
-  This problem is chronic and stable -Patient's creatinine was 1.1 in April which is close to her baseline -We will consider repeating her BMP on her follow-up visit if she has persistent lightheadedness to assess for worsening renal failure

## 2018-06-09 NOTE — Assessment & Plan Note (Signed)
-  This problem is chronic and stable -Patient recently had her levothyroxine increased to 100 mcg daily by endocrinology -She will need repeat TSH at her follow-up in person visit -No further work-up at this time

## 2018-06-09 NOTE — Assessment & Plan Note (Addendum)
-  This problem is chronic -Patient states that her blood sugars have been in the 140s to 150s over the last week with the highest of 224 -She states that prior to this her blood sugars have been uncontrolled and usually run high -However, she did state that 2 weeks ago she had one episode of a really low blood sugar of 29 -Given patient's persistent dizziness and falls and episode of hypoglycemia she will need an in person visit for further reassessment of this -I have instructed the patient to bring her glucometer with her to her next appointment so we can review her blood sugars in detail -We will also check an A1c at her in person appointment -Continue with Ozempic, Humalog pre-meal as well as [place] daily -If she has recurrent lows we will decrease her insulin based on when she had her lows  Addendum: The patient is testing their blood sugar 4 times a day and they are injecting insulin 3 times a day. The patient is making adjustments to their insulin doses based on glucose readings.

## 2018-06-09 NOTE — Assessment & Plan Note (Signed)
-  This problem is chronic and stable -Patient states that the home health aide has been checking her blood pressure at home and states that it was "good" -Patient has been taken off all her antihypertensives at this time secondary to episodes of lightheadedness on standing -She still complains of persistent lightheadedness on standing and had 2 falls last week. -Patient will need an in person visit even recurrent episodes of dizziness and lightheadedness and falls at home -No further work-up at this time -We will recheck her blood pressure when she follows up for her in person visit

## 2018-06-09 NOTE — Assessment & Plan Note (Signed)
-  This problem is chronic and stable -Patient states that her mood has been up and down lately -She has been having telephone visit to [facility] and is compliant with her home medications of Effexor 150 mg as well as BuSpar 5 mg twice daily -We will continue with current regimen for now -We will follow-up PHQ 9 score at her in person visit

## 2018-06-09 NOTE — Assessment & Plan Note (Signed)
-  This problem is chronic and stable -Patient states that she has run out of her Lyrica but that her symptoms have been stable off of this -We will hold Lyrica for now and discussed this with her at her in person visit -No further work-up at this time

## 2018-06-11 ENCOUNTER — Encounter: Payer: Self-pay | Admitting: Internal Medicine

## 2018-06-11 ENCOUNTER — Ambulatory Visit (INDEPENDENT_AMBULATORY_CARE_PROVIDER_SITE_OTHER): Payer: Medicare Other | Admitting: Internal Medicine

## 2018-06-11 ENCOUNTER — Other Ambulatory Visit: Payer: Self-pay

## 2018-06-11 VITALS — BP 138/67 | HR 84 | Wt 122.9 lb

## 2018-06-11 DIAGNOSIS — G4700 Insomnia, unspecified: Secondary | ICD-10-CM | POA: Insufficient documentation

## 2018-06-11 DIAGNOSIS — R42 Dizziness and giddiness: Secondary | ICD-10-CM | POA: Diagnosis not present

## 2018-06-11 DIAGNOSIS — Z794 Long term (current) use of insulin: Secondary | ICD-10-CM

## 2018-06-11 DIAGNOSIS — Z6822 Body mass index (BMI) 22.0-22.9, adult: Secondary | ICD-10-CM | POA: Diagnosis not present

## 2018-06-11 DIAGNOSIS — I10 Essential (primary) hypertension: Secondary | ICD-10-CM

## 2018-06-11 DIAGNOSIS — Z853 Personal history of malignant neoplasm of breast: Secondary | ICD-10-CM | POA: Diagnosis not present

## 2018-06-11 DIAGNOSIS — H04123 Dry eye syndrome of bilateral lacrimal glands: Secondary | ICD-10-CM | POA: Diagnosis not present

## 2018-06-11 DIAGNOSIS — E039 Hypothyroidism, unspecified: Secondary | ICD-10-CM

## 2018-06-11 DIAGNOSIS — M1990 Unspecified osteoarthritis, unspecified site: Secondary | ICD-10-CM | POA: Diagnosis not present

## 2018-06-11 DIAGNOSIS — E1142 Type 2 diabetes mellitus with diabetic polyneuropathy: Secondary | ICD-10-CM

## 2018-06-11 DIAGNOSIS — Z9181 History of falling: Secondary | ICD-10-CM | POA: Diagnosis not present

## 2018-06-11 DIAGNOSIS — Z9221 Personal history of antineoplastic chemotherapy: Secondary | ICD-10-CM | POA: Diagnosis not present

## 2018-06-11 DIAGNOSIS — H353220 Exudative age-related macular degeneration, left eye, stage unspecified: Secondary | ICD-10-CM | POA: Diagnosis not present

## 2018-06-11 DIAGNOSIS — K5710 Diverticulosis of small intestine without perforation or abscess without bleeding: Secondary | ICD-10-CM | POA: Diagnosis not present

## 2018-06-11 DIAGNOSIS — F5104 Psychophysiologic insomnia: Secondary | ICD-10-CM

## 2018-06-11 DIAGNOSIS — I890 Lymphedema, not elsewhere classified: Secondary | ICD-10-CM | POA: Diagnosis not present

## 2018-06-11 DIAGNOSIS — N183 Chronic kidney disease, stage 3 (moderate): Secondary | ICD-10-CM | POA: Diagnosis not present

## 2018-06-11 DIAGNOSIS — N132 Hydronephrosis with renal and ureteral calculous obstruction: Secondary | ICD-10-CM | POA: Diagnosis not present

## 2018-06-11 DIAGNOSIS — I129 Hypertensive chronic kidney disease with stage 1 through stage 4 chronic kidney disease, or unspecified chronic kidney disease: Secondary | ICD-10-CM | POA: Diagnosis not present

## 2018-06-11 DIAGNOSIS — Z923 Personal history of irradiation: Secondary | ICD-10-CM | POA: Diagnosis not present

## 2018-06-11 DIAGNOSIS — Z79899 Other long term (current) drug therapy: Secondary | ICD-10-CM

## 2018-06-11 DIAGNOSIS — Z79890 Hormone replacement therapy (postmenopausal): Secondary | ICD-10-CM

## 2018-06-11 DIAGNOSIS — E1122 Type 2 diabetes mellitus with diabetic chronic kidney disease: Secondary | ICD-10-CM | POA: Diagnosis not present

## 2018-06-11 DIAGNOSIS — Z7984 Long term (current) use of oral hypoglycemic drugs: Secondary | ICD-10-CM

## 2018-06-11 DIAGNOSIS — Z90710 Acquired absence of both cervix and uterus: Secondary | ICD-10-CM

## 2018-06-11 LAB — POCT GLYCOSYLATED HEMOGLOBIN (HGB A1C): Hemoglobin A1C: 8.5 % — AB (ref 4.0–5.6)

## 2018-06-11 LAB — GLUCOSE, CAPILLARY: Glucose-Capillary: 163 mg/dL — ABNORMAL HIGH (ref 70–99)

## 2018-06-11 NOTE — Assessment & Plan Note (Addendum)
Patient was evaluated two days ago over a telehealth visit and described persistent dizziness associated with falls. In the past she has had orthostatic hypotension and hypoglycemia so PCP recommended in person follow up for evaluation of these. Last follow up for this was one month ago, at the time it was noted that she was still on antihypertensives because she received medications in pill packs.   Most recent episode was about one week ago when after lunch she experienced light headedness after standing and fell straight to the floor. She extended her arms and kept herself from hitting her head. Usually these falls happen when she goes from a sitting to standing position. At times she does have a little bit of a " swimmy head" feeling which can happen with sitting when she turns her head. She describes using compensatory mechanisms such as sitting on the edge of her bed before standing in the morning to lessen the dizziness.   Thought related to autonomic dysfunction related to her diabetes, however the appropriate compensatory HR response to orthostatic drop in blood pressure does not correlate with this. On 5/4 she picked stopped taking the pill packs and was switched to taking individual medications through [facility]. She has not noticed any improvement in the dizziness since making this change. Medication list contains medications which are contributing including Trazodone and venlafaxine and buspirone. It seems that although trazodone is on her medication list, she has not been taking it since the beginning of the month and has not noticed any worsening of trouble sleeping. She  MRI brain in November when dizziness began was negative. Orthostasis has improved with IV fluid rehydration in the past. Last CBC 11/2017 showed mild normocytic anemia. She had an episode of vaginal bleeding about two months ago, enough to cover a light pad. She is s/p hysterectomy for fibroids, she believes this was a total  hysterectomy performed in the 70s. She describes drinking about three glasses of water per day, she does not feel dehydrated and exam today is not consistent with this. Review of blood glucose meter does show intermittent hypoglycemia, she states that the hypoglycemia and the days that she had severe dizziness does not correlate with hypoglycemia on her meter checks.  A: Orthostatic hypotension- likely related to autonomic dysfunction, aging, and possibly hypoglycemia.   - encouraged to check blood glucose at the time of exacerbations of orthostatic hypotension  - obtain Kappa and lambda free light chains, IFE  - will remove lyrica and meclizine and trazodone from med list as she reports she is not taking these. Could consider trial of discontinuing venlafaxine and buspirone in the future if symptoms persist.  - discussed compensatory mechanisms for positional orthostatic dizziness and encouraged increasing fluid intake

## 2018-06-11 NOTE — Assessment & Plan Note (Signed)
It seems that although trazodone is on her medication list, she has not been taking it since the beginning of the month and has not noticed any worsening of trouble sleeping. - discontinue trazodone, transition to melatonin and address sleep hygiene

## 2018-06-11 NOTE — Progress Notes (Addendum)
CC: follow up of dizziness  HPI:  [name] is a 68 y.o. with PMH as listed below who presents for  follow up of dizziness. Please see the assessment and plans for the status of the patient chronic medical problems.    Past Medical History:  Diagnosis Date   Arthritis    Borderline glaucoma of both eyes    Depression    Diabetic polyneuropathy ([place])    Diverticulosis of colon    Dry eyes, bilateral    Feeling of incomplete bladder emptying    History of breast cancer oncologist-  dr Waymon Budge-- per lov note no recurrence   dx 04/ 1999 --- Stage 3B-- s/p  chemotherapy then right mastectomy then concurrent chemoradiation therapy   History of urinary retention    01/ 2018   Hydronephrosis of right kidney    Hyperlipidemia    Hypertension    Hypothyroidism    Insulin dependent type 2 diabetes mellitus [facility])    endocrinologist-  dr Cruzita Lederer---  last A1c 8.7 on 02-20-2016   Lymphedema of upper extremity    right   Macular degeneration, left eye    followed by dr Zigmund Daniel   Renal insufficiency    Urgency of urination    Review of Systems:  Refer to history of present illness and assessment and plans for pertinent review of systems, all others reviewed and negative  Physical Exam:  Vitals:   06/11/18 1005  Pulse: 83  SpO2: 100%  Weight: 122 lb 14.4 oz (55.7 kg)   Orthostatic vitals :  Laying down: 152/63, HR 83  Sitting: 138/67, HR 84  Standing: 120/67, HR 91  Standing after 3 m: 142/67, HR 91   General: well appearing, not in acute distress  Cardiac: regular rate and rhythm, no murmurs or gallops, no peripheral edema  Pulm: lungs clear to auscultation, normal work of breathing   Assessment & Plan:   Dizziness  Patient was evaluated two days ago over a telehealth visit and described persistent dizziness associated with falls. In the past she has had orthostatic hypotension and hypoglycemia so PCP recommended in person follow up for  evaluation of these. Last follow up for this was one month ago, at the time it was noted that she was still on antihypertensives because she received medications in pill packs.   Most recent episode was about one week ago when after lunch she experienced light headedness after standing and fell straight to the floor. She extended her arms and kept herself from hitting her head. Usually these falls happen when she goes from a sitting to standing position. At times she does have a little bit of a " swimmy head" feeling which can happen with sitting when she turns her head. She describes using compensatory mechanisms such as sitting on the edge of her bed before standing in the morning to lessen the dizziness.   Thought related to autonomic dysfunction related to her diabetes, however the appropriate compensatory HR response to orthostatic drop in blood pressure does not correlate with this. On 5/4 she picked stopped taking the pill packs and was switched to taking individual medications through [facility]. She has not noticed any improvement in the dizziness since making this change. Medication list contains medications which are contributing including Trazodone and venlafaxine and buspirone. It seems that although trazodone is on her medication list, she has not been taking it since the beginning of the month and has not noticed any worsening of trouble sleeping. She  MRI brain in November when dizziness began was negative. Orthostasis has improved with IV fluid rehydration in the past. Last CBC 11/2017 showed mild normocytic anemia. She had an episode of vaginal bleeding about two months ago, enough to cover a light pad. She is s/p hysterectomy for fibroids, she believes this was a total hysterectomy performed in the 70s. She describes drinking about three glasses of water per day, she does not feel dehydrated and exam today is not consistent with this. Review of blood glucose meter does show intermittent  hypoglycemia, she states that the hypoglycemia and the days that she had severe dizziness does not correlate with hypoglycemia on her meter checks.  A: Orthostatic hypotension- likely related to autonomic dysfunction, aging, and possibly hypoglycemia.   - encouraged to check blood glucose at the time of exacerbations of orthostatic hypotension  - obtain Kappa and lambda free light chains, IFE  - will remove lyrica and meclizine and trazodone from med list as she reports she is not taking these. Could consider trial of discontinuing venlafaxine and buspirone in the future if symptoms persist.  - discussed compensatory mechanisms for positional orthostatic dizziness and encouraged increasing fluid intake   Type 2 diabetes  Diabetes addressed via telephone visit by PCP two days prior to this visit. The plan was to review her meter today and he requested that we obtain her follow up Hemoglobin A1c while she is in the office. -  follow up hemoglobin A1c  - of note, metformin is not on current medication list however she has been taking this at 1000 mg BID so I will add it to her medication list   Insomnia  It seems that although trazodone is on her medication list, she has not been taking it since the beginning of the month and has not noticed any worsening of trouble sleeping. - discontinue trazodone, transition to melatonin and address sleep hygiene   Hypothyroidism  Hypothyroidism addressed via telephone visit with PCP two days prior to this visit. TSH was just obtained about one month ago through endocrinology and was elevated (12.6) they have made adjustments to her synthroid dose and followed up with her on 5/8 for further management.  - encouraged continued follow up through endocrinology  See Encounters Tab for problem based charting.  Patient discussed with Dr. Beryle Beams

## 2018-06-11 NOTE — Patient Instructions (Addendum)
Thank you for coming to the clinic today. It was a pleasure to see you.   For your dizziness - please work on increasing your drink intake to 8 cups per day   Move from a lying down to standing position slowly, pause before moving from sitting to standing and starting to walk   Check your blood sugar the next time you get light headed and make a note of what the reading is   Increase your blood sugar checks to four times per day so we can get you approved for a continuous blood glucose monitor   Come back to see your primary care doctor in person in the next 1-3 months   Please call the internal medicine center clinic if you have any questions or concerns, we may be able to help and keep you from a long and expensive emergency room wait. Our clinic and after hours phone number is [phone], the best time to call is Monday through Friday 9 am to 4 pm but there is always someone available 24/7 if you have an emergency. If you need medication refills please notify your pharmacy one week in advance and they will send [place] a request.      Quality Sleep Information, Adult Quality sleep is important for your mental and physical health. It also improves your quality of life. Quality sleep means you:  Are asleep for most of the time you are in bed.  Fall asleep within 30 minutes.  Wake up no more than once a night.  Are awake for no longer than 20 minutes if you do wake up during the night. Most adults need 7-8 hours of quality sleep each night. How can poor sleep affect me? If you do not get enough quality sleep, you may have:  Mood swings.  Daytime sleepiness.  Confusion.  Decreased reaction time.  Sleep disorders, such as insomnia and sleep apnea.  Difficulty with: ? Solving problems. ? Coping with stress. ? Paying attention. These issues may affect your performance and productivity at work, school, and at home. Lack of sleep may also put you at higher risk for accidents,  suicide, and risky behaviors. If you do not get quality sleep you may also be at higher risk for several health problems, including:  Infections.  Type 2 diabetes.  Heart disease.  High blood pressure.  Obesity.  Worsening of long-term conditions, like arthritis, kidney disease, depression, Parkinson's disease, and epilepsy. What actions can I take to get more quality sleep?      Stick to a sleep schedule. Go to sleep and wake up at about the same time each day. Do not try to sleep less on weekdays and make up for lost sleep on weekends. This does not work.  Try to get about 30 minutes of exercise on most days. Do not exercise 2-3 hours before going to bed.  Limit naps during the day to 30 minutes or less.  Do not use any products that contain nicotine or tobacco, such as cigarettes or e-cigarettes. If you need help quitting, ask your health care provider.  Do not drink caffeinated beverages for at least 8 hours before going to bed. Coffee, tea, and some sodas contain caffeine.  Do not drink alcohol close to bedtime.  Do not eat large meals close to bedtime.  Do not take naps in the late afternoon.  Try to get at least 30 minutes of sunlight every day. Morning sunlight is best.  Make time to relax  before bed. Reading, listening to music, or taking a hot bath promotes quality sleep.  Make your bedroom a place that promotes quality sleep. Keep your bedroom dark, quiet, and at a comfortable room temperature. Make sure your bed is comfortable. Take out sleep distractions like TV, a computer, smartphone, and bright lights.  If you are lying awake in bed for longer than 20 minutes, get up and do a relaxing activity until you feel sleepy.  Work with your health care provider to treat medical conditions that may affect sleeping, such as: ? Nasal obstruction. ? Snoring. ? Sleep apnea and other sleep disorders.  Talk to your health care provider if you think any of your  prescription medicines may cause you to have difficulty falling or staying asleep.  If you have sleep problems, talk with a sleep consultant. If you think you have a sleep disorder, talk with your health care provider about getting evaluated by a specialist. Where to find more information  National Sleep Foundation website: https://sleepfoundation.org  National Heart, Lung, and Blood Institute (NHLBI): https://hall.info/.pdf  Centers for Disease Control and Prevention (CDC): DetailSports.is Contact a health care provider if you:  Have trouble getting to sleep or staying asleep.  Often wake up very early in the morning and cannot get back to sleep.  Have daytime sleepiness.  Have daytime sleep attacks of suddenly falling asleep and sudden muscle weakness (narcolepsy).  Have a tingling sensation in your legs with a strong urge to move your legs (restless legs syndrome).  Stop breathing briefly during sleep (sleep apnea).  Think you have a sleep disorder or are taking a medicine that is affecting your quality of sleep. Summary  Most adults need 7-8 hours of quality sleep each night.  Getting enough quality sleep is an important part of health and well-being.  Make your bedroom a place that promotes quality sleep and avoid things that may cause you to have poor sleep, such as alcohol, caffeine, smoking, and large meals.  Talk to your health care provider if you have trouble falling asleep or staying asleep. This information is not intended to replace advice given to you by your health care provider. Make sure you discuss any questions you have with your health care provider. Document Released: 04/23/2017 Document Revised: 04/23/2017 Document Reviewed: 04/23/2017 Elsevier Interactive Patient Education  2019 [employer].

## 2018-06-11 NOTE — Assessment & Plan Note (Signed)
Hypothyroidism addressed via telephone visit with PCP two days prior to this visit. TSH was just obtained about one month ago through endocrinology and was elevated (12.6) they have made adjustments to her synthroid dose and followed up with her on 5/8 for further management.  - encouraged continued follow up through endocrinology

## 2018-06-11 NOTE — Assessment & Plan Note (Signed)
Diabetes addressed via telephone visit by PCP two days prior to this visit. The plan was to review her meter today and he requested that we obtain her follow up Hemoglobin A1c while she is in the office. -  follow up hemoglobin A1c  - of note, metformin is not on current medication list however she has been taking this at 1000 mg BID so I will add it to her medication list

## 2018-06-12 LAB — IMMUNOFIXATION ELECTROPHORESIS
IgA/Immunoglobulin A, Serum: 211 mg/dL (ref 87–352)
IgG (Immunoglobin G), Serum: 1318 mg/dL (ref 586–1602)
IgM (Immunoglobulin M), Srm: 117 mg/dL (ref 26–217)
Total Protein: 7.1 g/dL (ref 6.0–8.5)

## 2018-06-12 LAB — KAPPA/LAMBDA LIGHT CHAINS
Ig Kappa Free Light Chain: 35.9 mg/L — ABNORMAL HIGH (ref 3.3–19.4)
Ig Lambda Free Light Chain: 21.7 mg/L (ref 5.7–26.3)
Kappa/Lambda FluidC Ratio: 1.65 (ref 0.26–1.65)

## 2018-06-12 NOTE — Progress Notes (Signed)
Medicine attending: Medical history, presenting problems, physical findings, and medications, reviewed with resident physician Dr Hetty Ely on the day of the patient visit and I concur with her evaluation and management plan. Pt w inflammatory breast cancer in long term remission, HTN, DM2. Ongoing eval for dizziness/lightheadednes, usually postural, fall in systolic BP without rise in pulse rate. Extensive eval to date summarized by Dr Hetty Ely. Suspect mutifactorial: DM related autonomic neuropathy, hypoglycemic episodes, age. Chronic stable, normochromic  anemia; renal function OK; we will check a myeloma/amyloid screen but clinically low suspicion.

## 2018-06-16 ENCOUNTER — Ambulatory Visit: Payer: Medicare Other | Admitting: Podiatry

## 2018-06-16 ENCOUNTER — Telehealth: Payer: Self-pay | Admitting: Dietician

## 2018-06-16 NOTE — Telephone Encounter (Signed)
[name] is a 68 y.o. female who was contacted on behalf of [facility] Task Force.   Diabetes Assessment  DM meds and BS checks -  "What medications are you taking for diabetes?" tresiba 16u qam -                          humalog 6-7u mostly 7-3x/day                                 ozempic- 0.5 on sundays                                Metformin- twice a day Going to have a nurse help organize pills. -  "How often do you check your blood sugars at home?" 2- 4x/day - "What have your blood sugars been?" 135/165      -               [id number] am/                            159/                             117/147                              138                             259                            13 8/180/266 Encouraged her to test before meal and bedtime daily.   High Blood Pressure Assessment  BP meds & BP checks -  "What medications are you taking for high blood pressure?" no - "How often do you check your blood pressure at home?" has a meter, has not used - "What have your blood pressure readings been?" n/a  Coping with DM and BP - What else are you doing to help with your DM and BP - Diet? Peanut butter crackers, greens, baked chicken, son fixs food, oatmeal, eggs scrambled, fruit every other day, boiled eggs, whole grain bread with eggs, crackers, drinks milk, water, tea with splenda. Veggie every day.  - Exercise? Tries to do band exercises  Medication Access Issues  Medication Issues? -  "Are you getting your medicines refilled on time without skipping any doses?" no - "Are you having any problems getting/taking your meds (cost, timing, transportation)?" no - Do you need any meds refilled? no  Conclusion  Close the call - Date of follow-up visit scheduled- June, July, aug tbd - "Any other questions or concerns?" no- would like dexcom cgm  Debera Lat, RD 06/16/2018 10:30 AM.

## 2018-06-17 ENCOUNTER — Ambulatory Visit: Payer: Medicare Other | Admitting: Podiatry

## 2018-06-17 DIAGNOSIS — Z853 Personal history of malignant neoplasm of breast: Secondary | ICD-10-CM | POA: Diagnosis not present

## 2018-06-17 DIAGNOSIS — Z9181 History of falling: Secondary | ICD-10-CM | POA: Diagnosis not present

## 2018-06-17 DIAGNOSIS — I129 Hypertensive chronic kidney disease with stage 1 through stage 4 chronic kidney disease, or unspecified chronic kidney disease: Secondary | ICD-10-CM | POA: Diagnosis not present

## 2018-06-17 DIAGNOSIS — Z9221 Personal history of antineoplastic chemotherapy: Secondary | ICD-10-CM | POA: Diagnosis not present

## 2018-06-17 DIAGNOSIS — I890 Lymphedema, not elsewhere classified: Secondary | ICD-10-CM | POA: Diagnosis not present

## 2018-06-17 DIAGNOSIS — H04123 Dry eye syndrome of bilateral lacrimal glands: Secondary | ICD-10-CM | POA: Diagnosis not present

## 2018-06-17 DIAGNOSIS — E039 Hypothyroidism, unspecified: Secondary | ICD-10-CM | POA: Diagnosis not present

## 2018-06-17 DIAGNOSIS — E1122 Type 2 diabetes mellitus with diabetic chronic kidney disease: Secondary | ICD-10-CM | POA: Diagnosis not present

## 2018-06-17 DIAGNOSIS — K5710 Diverticulosis of small intestine without perforation or abscess without bleeding: Secondary | ICD-10-CM | POA: Diagnosis not present

## 2018-06-17 DIAGNOSIS — E1142 Type 2 diabetes mellitus with diabetic polyneuropathy: Secondary | ICD-10-CM | POA: Diagnosis not present

## 2018-06-17 DIAGNOSIS — H353220 Exudative age-related macular degeneration, left eye, stage unspecified: Secondary | ICD-10-CM | POA: Diagnosis not present

## 2018-06-17 DIAGNOSIS — N183 Chronic kidney disease, stage 3 (moderate): Secondary | ICD-10-CM | POA: Diagnosis not present

## 2018-06-17 DIAGNOSIS — M1991 Primary osteoarthritis, unspecified site: Secondary | ICD-10-CM | POA: Diagnosis not present

## 2018-06-17 DIAGNOSIS — N132 Hydronephrosis with renal and ureteral calculous obstruction: Secondary | ICD-10-CM | POA: Diagnosis not present

## 2018-06-17 DIAGNOSIS — Z923 Personal history of irradiation: Secondary | ICD-10-CM | POA: Diagnosis not present

## 2018-06-17 DIAGNOSIS — Z794 Long term (current) use of insulin: Secondary | ICD-10-CM | POA: Diagnosis not present

## 2018-06-17 DIAGNOSIS — Z6822 Body mass index (BMI) 22.0-22.9, adult: Secondary | ICD-10-CM | POA: Diagnosis not present

## 2018-06-23 DIAGNOSIS — Z6822 Body mass index (BMI) 22.0-22.9, adult: Secondary | ICD-10-CM | POA: Diagnosis not present

## 2018-06-23 DIAGNOSIS — I890 Lymphedema, not elsewhere classified: Secondary | ICD-10-CM | POA: Diagnosis not present

## 2018-06-23 DIAGNOSIS — H353220 Exudative age-related macular degeneration, left eye, stage unspecified: Secondary | ICD-10-CM | POA: Diagnosis not present

## 2018-06-23 DIAGNOSIS — M1991 Primary osteoarthritis, unspecified site: Secondary | ICD-10-CM | POA: Diagnosis not present

## 2018-06-23 DIAGNOSIS — E1142 Type 2 diabetes mellitus with diabetic polyneuropathy: Secondary | ICD-10-CM | POA: Diagnosis not present

## 2018-06-23 DIAGNOSIS — N132 Hydronephrosis with renal and ureteral calculous obstruction: Secondary | ICD-10-CM | POA: Diagnosis not present

## 2018-06-23 DIAGNOSIS — Z853 Personal history of malignant neoplasm of breast: Secondary | ICD-10-CM | POA: Diagnosis not present

## 2018-06-23 DIAGNOSIS — N183 Chronic kidney disease, stage 3 (moderate): Secondary | ICD-10-CM | POA: Diagnosis not present

## 2018-06-23 DIAGNOSIS — E1122 Type 2 diabetes mellitus with diabetic chronic kidney disease: Secondary | ICD-10-CM | POA: Diagnosis not present

## 2018-06-23 DIAGNOSIS — E039 Hypothyroidism, unspecified: Secondary | ICD-10-CM | POA: Diagnosis not present

## 2018-06-23 DIAGNOSIS — H04123 Dry eye syndrome of bilateral lacrimal glands: Secondary | ICD-10-CM | POA: Diagnosis not present

## 2018-06-23 DIAGNOSIS — Z794 Long term (current) use of insulin: Secondary | ICD-10-CM | POA: Diagnosis not present

## 2018-06-23 DIAGNOSIS — Z9181 History of falling: Secondary | ICD-10-CM | POA: Diagnosis not present

## 2018-06-23 DIAGNOSIS — K5710 Diverticulosis of small intestine without perforation or abscess without bleeding: Secondary | ICD-10-CM | POA: Diagnosis not present

## 2018-06-23 DIAGNOSIS — Z9221 Personal history of antineoplastic chemotherapy: Secondary | ICD-10-CM | POA: Diagnosis not present

## 2018-06-23 DIAGNOSIS — I129 Hypertensive chronic kidney disease with stage 1 through stage 4 chronic kidney disease, or unspecified chronic kidney disease: Secondary | ICD-10-CM | POA: Diagnosis not present

## 2018-06-23 DIAGNOSIS — Z923 Personal history of irradiation: Secondary | ICD-10-CM | POA: Diagnosis not present

## 2018-06-24 DIAGNOSIS — Z6822 Body mass index (BMI) 22.0-22.9, adult: Secondary | ICD-10-CM | POA: Diagnosis not present

## 2018-06-24 DIAGNOSIS — Z923 Personal history of irradiation: Secondary | ICD-10-CM | POA: Diagnosis not present

## 2018-06-24 DIAGNOSIS — N132 Hydronephrosis with renal and ureteral calculous obstruction: Secondary | ICD-10-CM | POA: Diagnosis not present

## 2018-06-24 DIAGNOSIS — I129 Hypertensive chronic kidney disease with stage 1 through stage 4 chronic kidney disease, or unspecified chronic kidney disease: Secondary | ICD-10-CM | POA: Diagnosis not present

## 2018-06-24 DIAGNOSIS — Z853 Personal history of malignant neoplasm of breast: Secondary | ICD-10-CM | POA: Diagnosis not present

## 2018-06-24 DIAGNOSIS — M1991 Primary osteoarthritis, unspecified site: Secondary | ICD-10-CM | POA: Diagnosis not present

## 2018-06-24 DIAGNOSIS — N183 Chronic kidney disease, stage 3 (moderate): Secondary | ICD-10-CM | POA: Diagnosis not present

## 2018-06-24 DIAGNOSIS — Z9221 Personal history of antineoplastic chemotherapy: Secondary | ICD-10-CM | POA: Diagnosis not present

## 2018-06-24 DIAGNOSIS — E039 Hypothyroidism, unspecified: Secondary | ICD-10-CM | POA: Diagnosis not present

## 2018-06-24 DIAGNOSIS — H04123 Dry eye syndrome of bilateral lacrimal glands: Secondary | ICD-10-CM | POA: Diagnosis not present

## 2018-06-24 DIAGNOSIS — E1122 Type 2 diabetes mellitus with diabetic chronic kidney disease: Secondary | ICD-10-CM | POA: Diagnosis not present

## 2018-06-24 DIAGNOSIS — K5710 Diverticulosis of small intestine without perforation or abscess without bleeding: Secondary | ICD-10-CM | POA: Diagnosis not present

## 2018-06-24 DIAGNOSIS — Z9181 History of falling: Secondary | ICD-10-CM | POA: Diagnosis not present

## 2018-06-24 DIAGNOSIS — Z794 Long term (current) use of insulin: Secondary | ICD-10-CM | POA: Diagnosis not present

## 2018-06-24 DIAGNOSIS — I890 Lymphedema, not elsewhere classified: Secondary | ICD-10-CM | POA: Diagnosis not present

## 2018-06-24 DIAGNOSIS — H353220 Exudative age-related macular degeneration, left eye, stage unspecified: Secondary | ICD-10-CM | POA: Diagnosis not present

## 2018-06-24 DIAGNOSIS — E1142 Type 2 diabetes mellitus with diabetic polyneuropathy: Secondary | ICD-10-CM | POA: Diagnosis not present

## 2018-06-26 DIAGNOSIS — I129 Hypertensive chronic kidney disease with stage 1 through stage 4 chronic kidney disease, or unspecified chronic kidney disease: Secondary | ICD-10-CM | POA: Diagnosis not present

## 2018-06-26 DIAGNOSIS — Z794 Long term (current) use of insulin: Secondary | ICD-10-CM | POA: Diagnosis not present

## 2018-06-26 DIAGNOSIS — Z9221 Personal history of antineoplastic chemotherapy: Secondary | ICD-10-CM | POA: Diagnosis not present

## 2018-06-26 DIAGNOSIS — M1991 Primary osteoarthritis, unspecified site: Secondary | ICD-10-CM | POA: Diagnosis not present

## 2018-06-26 DIAGNOSIS — Z853 Personal history of malignant neoplasm of breast: Secondary | ICD-10-CM | POA: Diagnosis not present

## 2018-06-26 DIAGNOSIS — H04123 Dry eye syndrome of bilateral lacrimal glands: Secondary | ICD-10-CM | POA: Diagnosis not present

## 2018-06-26 DIAGNOSIS — Z923 Personal history of irradiation: Secondary | ICD-10-CM | POA: Diagnosis not present

## 2018-06-26 DIAGNOSIS — E1122 Type 2 diabetes mellitus with diabetic chronic kidney disease: Secondary | ICD-10-CM | POA: Diagnosis not present

## 2018-06-26 DIAGNOSIS — N132 Hydronephrosis with renal and ureteral calculous obstruction: Secondary | ICD-10-CM | POA: Diagnosis not present

## 2018-06-26 DIAGNOSIS — N183 Chronic kidney disease, stage 3 (moderate): Secondary | ICD-10-CM | POA: Diagnosis not present

## 2018-06-26 DIAGNOSIS — E039 Hypothyroidism, unspecified: Secondary | ICD-10-CM | POA: Diagnosis not present

## 2018-06-26 DIAGNOSIS — K5710 Diverticulosis of small intestine without perforation or abscess without bleeding: Secondary | ICD-10-CM | POA: Diagnosis not present

## 2018-06-26 DIAGNOSIS — Z9181 History of falling: Secondary | ICD-10-CM | POA: Diagnosis not present

## 2018-06-26 DIAGNOSIS — H353220 Exudative age-related macular degeneration, left eye, stage unspecified: Secondary | ICD-10-CM | POA: Diagnosis not present

## 2018-06-26 DIAGNOSIS — Z6822 Body mass index (BMI) 22.0-22.9, adult: Secondary | ICD-10-CM | POA: Diagnosis not present

## 2018-06-26 DIAGNOSIS — E1142 Type 2 diabetes mellitus with diabetic polyneuropathy: Secondary | ICD-10-CM | POA: Diagnosis not present

## 2018-06-26 DIAGNOSIS — I890 Lymphedema, not elsewhere classified: Secondary | ICD-10-CM | POA: Diagnosis not present

## 2018-06-30 ENCOUNTER — Encounter: Payer: Self-pay | Admitting: Internal Medicine

## 2018-06-30 DIAGNOSIS — E1142 Type 2 diabetes mellitus with diabetic polyneuropathy: Secondary | ICD-10-CM | POA: Diagnosis not present

## 2018-06-30 DIAGNOSIS — Z6822 Body mass index (BMI) 22.0-22.9, adult: Secondary | ICD-10-CM | POA: Diagnosis not present

## 2018-06-30 DIAGNOSIS — E039 Hypothyroidism, unspecified: Secondary | ICD-10-CM | POA: Diagnosis not present

## 2018-06-30 DIAGNOSIS — K5710 Diverticulosis of small intestine without perforation or abscess without bleeding: Secondary | ICD-10-CM | POA: Diagnosis not present

## 2018-06-30 DIAGNOSIS — Z9221 Personal history of antineoplastic chemotherapy: Secondary | ICD-10-CM | POA: Diagnosis not present

## 2018-06-30 DIAGNOSIS — I129 Hypertensive chronic kidney disease with stage 1 through stage 4 chronic kidney disease, or unspecified chronic kidney disease: Secondary | ICD-10-CM | POA: Diagnosis not present

## 2018-06-30 DIAGNOSIS — Z923 Personal history of irradiation: Secondary | ICD-10-CM | POA: Diagnosis not present

## 2018-06-30 DIAGNOSIS — M1991 Primary osteoarthritis, unspecified site: Secondary | ICD-10-CM | POA: Diagnosis not present

## 2018-06-30 DIAGNOSIS — N183 Chronic kidney disease, stage 3 (moderate): Secondary | ICD-10-CM | POA: Diagnosis not present

## 2018-06-30 DIAGNOSIS — E1122 Type 2 diabetes mellitus with diabetic chronic kidney disease: Secondary | ICD-10-CM | POA: Diagnosis not present

## 2018-06-30 DIAGNOSIS — I890 Lymphedema, not elsewhere classified: Secondary | ICD-10-CM | POA: Diagnosis not present

## 2018-06-30 DIAGNOSIS — Z9181 History of falling: Secondary | ICD-10-CM | POA: Diagnosis not present

## 2018-06-30 DIAGNOSIS — Z794 Long term (current) use of insulin: Secondary | ICD-10-CM | POA: Diagnosis not present

## 2018-06-30 DIAGNOSIS — H04123 Dry eye syndrome of bilateral lacrimal glands: Secondary | ICD-10-CM | POA: Diagnosis not present

## 2018-06-30 DIAGNOSIS — N132 Hydronephrosis with renal and ureteral calculous obstruction: Secondary | ICD-10-CM | POA: Diagnosis not present

## 2018-06-30 DIAGNOSIS — Z853 Personal history of malignant neoplasm of breast: Secondary | ICD-10-CM | POA: Diagnosis not present

## 2018-06-30 DIAGNOSIS — H353220 Exudative age-related macular degeneration, left eye, stage unspecified: Secondary | ICD-10-CM | POA: Diagnosis not present

## 2018-07-02 DIAGNOSIS — N183 Chronic kidney disease, stage 3 (moderate): Secondary | ICD-10-CM | POA: Diagnosis not present

## 2018-07-02 DIAGNOSIS — Z794 Long term (current) use of insulin: Secondary | ICD-10-CM | POA: Diagnosis not present

## 2018-07-02 DIAGNOSIS — Z9221 Personal history of antineoplastic chemotherapy: Secondary | ICD-10-CM | POA: Diagnosis not present

## 2018-07-02 DIAGNOSIS — Z853 Personal history of malignant neoplasm of breast: Secondary | ICD-10-CM | POA: Diagnosis not present

## 2018-07-02 DIAGNOSIS — E039 Hypothyroidism, unspecified: Secondary | ICD-10-CM | POA: Diagnosis not present

## 2018-07-02 DIAGNOSIS — N132 Hydronephrosis with renal and ureteral calculous obstruction: Secondary | ICD-10-CM | POA: Diagnosis not present

## 2018-07-02 DIAGNOSIS — I129 Hypertensive chronic kidney disease with stage 1 through stage 4 chronic kidney disease, or unspecified chronic kidney disease: Secondary | ICD-10-CM | POA: Diagnosis not present

## 2018-07-02 DIAGNOSIS — H04123 Dry eye syndrome of bilateral lacrimal glands: Secondary | ICD-10-CM | POA: Diagnosis not present

## 2018-07-02 DIAGNOSIS — H353220 Exudative age-related macular degeneration, left eye, stage unspecified: Secondary | ICD-10-CM | POA: Diagnosis not present

## 2018-07-02 DIAGNOSIS — E1142 Type 2 diabetes mellitus with diabetic polyneuropathy: Secondary | ICD-10-CM | POA: Diagnosis not present

## 2018-07-02 DIAGNOSIS — Z923 Personal history of irradiation: Secondary | ICD-10-CM | POA: Diagnosis not present

## 2018-07-02 DIAGNOSIS — I890 Lymphedema, not elsewhere classified: Secondary | ICD-10-CM | POA: Diagnosis not present

## 2018-07-02 DIAGNOSIS — M1991 Primary osteoarthritis, unspecified site: Secondary | ICD-10-CM | POA: Diagnosis not present

## 2018-07-02 DIAGNOSIS — Z9181 History of falling: Secondary | ICD-10-CM | POA: Diagnosis not present

## 2018-07-02 DIAGNOSIS — E1122 Type 2 diabetes mellitus with diabetic chronic kidney disease: Secondary | ICD-10-CM | POA: Diagnosis not present

## 2018-07-02 DIAGNOSIS — Z6822 Body mass index (BMI) 22.0-22.9, adult: Secondary | ICD-10-CM | POA: Diagnosis not present

## 2018-07-02 DIAGNOSIS — K5710 Diverticulosis of small intestine without perforation or abscess without bleeding: Secondary | ICD-10-CM | POA: Diagnosis not present

## 2018-07-03 DIAGNOSIS — Z6822 Body mass index (BMI) 22.0-22.9, adult: Secondary | ICD-10-CM | POA: Diagnosis not present

## 2018-07-03 DIAGNOSIS — N132 Hydronephrosis with renal and ureteral calculous obstruction: Secondary | ICD-10-CM | POA: Diagnosis not present

## 2018-07-03 DIAGNOSIS — H04123 Dry eye syndrome of bilateral lacrimal glands: Secondary | ICD-10-CM | POA: Diagnosis not present

## 2018-07-03 DIAGNOSIS — N183 Chronic kidney disease, stage 3 (moderate): Secondary | ICD-10-CM | POA: Diagnosis not present

## 2018-07-03 DIAGNOSIS — I129 Hypertensive chronic kidney disease with stage 1 through stage 4 chronic kidney disease, or unspecified chronic kidney disease: Secondary | ICD-10-CM | POA: Diagnosis not present

## 2018-07-03 DIAGNOSIS — Z853 Personal history of malignant neoplasm of breast: Secondary | ICD-10-CM | POA: Diagnosis not present

## 2018-07-03 DIAGNOSIS — E039 Hypothyroidism, unspecified: Secondary | ICD-10-CM | POA: Diagnosis not present

## 2018-07-03 DIAGNOSIS — Z9221 Personal history of antineoplastic chemotherapy: Secondary | ICD-10-CM | POA: Diagnosis not present

## 2018-07-03 DIAGNOSIS — I890 Lymphedema, not elsewhere classified: Secondary | ICD-10-CM | POA: Diagnosis not present

## 2018-07-03 DIAGNOSIS — K5710 Diverticulosis of small intestine without perforation or abscess without bleeding: Secondary | ICD-10-CM | POA: Diagnosis not present

## 2018-07-03 DIAGNOSIS — E1122 Type 2 diabetes mellitus with diabetic chronic kidney disease: Secondary | ICD-10-CM | POA: Diagnosis not present

## 2018-07-03 DIAGNOSIS — H353220 Exudative age-related macular degeneration, left eye, stage unspecified: Secondary | ICD-10-CM | POA: Diagnosis not present

## 2018-07-03 DIAGNOSIS — E1142 Type 2 diabetes mellitus with diabetic polyneuropathy: Secondary | ICD-10-CM | POA: Diagnosis not present

## 2018-07-03 DIAGNOSIS — Z923 Personal history of irradiation: Secondary | ICD-10-CM | POA: Diagnosis not present

## 2018-07-03 DIAGNOSIS — M1991 Primary osteoarthritis, unspecified site: Secondary | ICD-10-CM | POA: Diagnosis not present

## 2018-07-03 DIAGNOSIS — Z794 Long term (current) use of insulin: Secondary | ICD-10-CM | POA: Diagnosis not present

## 2018-07-03 DIAGNOSIS — Z9181 History of falling: Secondary | ICD-10-CM | POA: Diagnosis not present

## 2018-07-07 ENCOUNTER — Telehealth: Payer: Self-pay | Admitting: Internal Medicine

## 2018-07-07 DIAGNOSIS — E039 Hypothyroidism, unspecified: Secondary | ICD-10-CM | POA: Diagnosis not present

## 2018-07-07 DIAGNOSIS — Z853 Personal history of malignant neoplasm of breast: Secondary | ICD-10-CM | POA: Diagnosis not present

## 2018-07-07 DIAGNOSIS — I129 Hypertensive chronic kidney disease with stage 1 through stage 4 chronic kidney disease, or unspecified chronic kidney disease: Secondary | ICD-10-CM | POA: Diagnosis not present

## 2018-07-07 DIAGNOSIS — H04123 Dry eye syndrome of bilateral lacrimal glands: Secondary | ICD-10-CM | POA: Diagnosis not present

## 2018-07-07 DIAGNOSIS — Z923 Personal history of irradiation: Secondary | ICD-10-CM | POA: Diagnosis not present

## 2018-07-07 DIAGNOSIS — E1142 Type 2 diabetes mellitus with diabetic polyneuropathy: Secondary | ICD-10-CM | POA: Diagnosis not present

## 2018-07-07 DIAGNOSIS — N183 Chronic kidney disease, stage 3 (moderate): Secondary | ICD-10-CM | POA: Diagnosis not present

## 2018-07-07 DIAGNOSIS — Z794 Long term (current) use of insulin: Secondary | ICD-10-CM | POA: Diagnosis not present

## 2018-07-07 DIAGNOSIS — I890 Lymphedema, not elsewhere classified: Secondary | ICD-10-CM | POA: Diagnosis not present

## 2018-07-07 DIAGNOSIS — N132 Hydronephrosis with renal and ureteral calculous obstruction: Secondary | ICD-10-CM | POA: Diagnosis not present

## 2018-07-07 DIAGNOSIS — H353220 Exudative age-related macular degeneration, left eye, stage unspecified: Secondary | ICD-10-CM | POA: Diagnosis not present

## 2018-07-07 DIAGNOSIS — K5710 Diverticulosis of small intestine without perforation or abscess without bleeding: Secondary | ICD-10-CM | POA: Diagnosis not present

## 2018-07-07 DIAGNOSIS — M1991 Primary osteoarthritis, unspecified site: Secondary | ICD-10-CM | POA: Diagnosis not present

## 2018-07-07 DIAGNOSIS — E1122 Type 2 diabetes mellitus with diabetic chronic kidney disease: Secondary | ICD-10-CM | POA: Diagnosis not present

## 2018-07-07 DIAGNOSIS — Z9181 History of falling: Secondary | ICD-10-CM | POA: Diagnosis not present

## 2018-07-07 DIAGNOSIS — Z6822 Body mass index (BMI) 22.0-22.9, adult: Secondary | ICD-10-CM | POA: Diagnosis not present

## 2018-07-07 DIAGNOSIS — Z9221 Personal history of antineoplastic chemotherapy: Secondary | ICD-10-CM | POA: Diagnosis not present

## 2018-07-07 NOTE — Telephone Encounter (Signed)
[facility] [name] requesting a call back about the pt's Headaches x 2 weeks.

## 2018-07-07 NOTE — Telephone Encounter (Signed)
[facility] calls and states pt continues for weeks to c/o h/a's. States vs are normal, she cannot give nurse vs at this time. Called pt, she states h/a's almost daily for over 1 month, denies N&V, short of breath, chest pain, weakness, vision or speech changes. States pain is in "the sides of my head, IBU "helps but does not make it go away" [facility] thurs 6/11 at 1445

## 2018-07-07 NOTE — Telephone Encounter (Signed)
I agree

## 2018-07-08 DIAGNOSIS — E1122 Type 2 diabetes mellitus with diabetic chronic kidney disease: Secondary | ICD-10-CM | POA: Diagnosis not present

## 2018-07-08 DIAGNOSIS — K5710 Diverticulosis of small intestine without perforation or abscess without bleeding: Secondary | ICD-10-CM | POA: Diagnosis not present

## 2018-07-08 DIAGNOSIS — Z9181 History of falling: Secondary | ICD-10-CM | POA: Diagnosis not present

## 2018-07-08 DIAGNOSIS — I890 Lymphedema, not elsewhere classified: Secondary | ICD-10-CM | POA: Diagnosis not present

## 2018-07-08 DIAGNOSIS — M1991 Primary osteoarthritis, unspecified site: Secondary | ICD-10-CM | POA: Diagnosis not present

## 2018-07-08 DIAGNOSIS — H04123 Dry eye syndrome of bilateral lacrimal glands: Secondary | ICD-10-CM | POA: Diagnosis not present

## 2018-07-08 DIAGNOSIS — Z9221 Personal history of antineoplastic chemotherapy: Secondary | ICD-10-CM | POA: Diagnosis not present

## 2018-07-08 DIAGNOSIS — Z6822 Body mass index (BMI) 22.0-22.9, adult: Secondary | ICD-10-CM | POA: Diagnosis not present

## 2018-07-08 DIAGNOSIS — N132 Hydronephrosis with renal and ureteral calculous obstruction: Secondary | ICD-10-CM | POA: Diagnosis not present

## 2018-07-08 DIAGNOSIS — H353220 Exudative age-related macular degeneration, left eye, stage unspecified: Secondary | ICD-10-CM | POA: Diagnosis not present

## 2018-07-08 DIAGNOSIS — E1142 Type 2 diabetes mellitus with diabetic polyneuropathy: Secondary | ICD-10-CM | POA: Diagnosis not present

## 2018-07-08 DIAGNOSIS — Z794 Long term (current) use of insulin: Secondary | ICD-10-CM | POA: Diagnosis not present

## 2018-07-08 DIAGNOSIS — Z923 Personal history of irradiation: Secondary | ICD-10-CM | POA: Diagnosis not present

## 2018-07-08 DIAGNOSIS — N183 Chronic kidney disease, stage 3 (moderate): Secondary | ICD-10-CM | POA: Diagnosis not present

## 2018-07-08 DIAGNOSIS — I129 Hypertensive chronic kidney disease with stage 1 through stage 4 chronic kidney disease, or unspecified chronic kidney disease: Secondary | ICD-10-CM | POA: Diagnosis not present

## 2018-07-08 DIAGNOSIS — E039 Hypothyroidism, unspecified: Secondary | ICD-10-CM | POA: Diagnosis not present

## 2018-07-08 DIAGNOSIS — Z853 Personal history of malignant neoplasm of breast: Secondary | ICD-10-CM | POA: Diagnosis not present

## 2018-07-09 ENCOUNTER — Ambulatory Visit: Payer: Medicare Other

## 2018-07-09 DIAGNOSIS — E039 Hypothyroidism, unspecified: Secondary | ICD-10-CM | POA: Diagnosis not present

## 2018-07-09 DIAGNOSIS — Z9181 History of falling: Secondary | ICD-10-CM | POA: Diagnosis not present

## 2018-07-09 DIAGNOSIS — E1122 Type 2 diabetes mellitus with diabetic chronic kidney disease: Secondary | ICD-10-CM | POA: Diagnosis not present

## 2018-07-09 DIAGNOSIS — K5710 Diverticulosis of small intestine without perforation or abscess without bleeding: Secondary | ICD-10-CM | POA: Diagnosis not present

## 2018-07-09 DIAGNOSIS — H353220 Exudative age-related macular degeneration, left eye, stage unspecified: Secondary | ICD-10-CM | POA: Diagnosis not present

## 2018-07-09 DIAGNOSIS — Z853 Personal history of malignant neoplasm of breast: Secondary | ICD-10-CM | POA: Diagnosis not present

## 2018-07-09 DIAGNOSIS — Z923 Personal history of irradiation: Secondary | ICD-10-CM | POA: Diagnosis not present

## 2018-07-09 DIAGNOSIS — N183 Chronic kidney disease, stage 3 (moderate): Secondary | ICD-10-CM | POA: Diagnosis not present

## 2018-07-09 DIAGNOSIS — Z794 Long term (current) use of insulin: Secondary | ICD-10-CM | POA: Diagnosis not present

## 2018-07-09 DIAGNOSIS — M1991 Primary osteoarthritis, unspecified site: Secondary | ICD-10-CM | POA: Diagnosis not present

## 2018-07-09 DIAGNOSIS — Z6822 Body mass index (BMI) 22.0-22.9, adult: Secondary | ICD-10-CM | POA: Diagnosis not present

## 2018-07-09 DIAGNOSIS — Z9221 Personal history of antineoplastic chemotherapy: Secondary | ICD-10-CM | POA: Diagnosis not present

## 2018-07-09 DIAGNOSIS — E1142 Type 2 diabetes mellitus with diabetic polyneuropathy: Secondary | ICD-10-CM | POA: Diagnosis not present

## 2018-07-09 DIAGNOSIS — N132 Hydronephrosis with renal and ureteral calculous obstruction: Secondary | ICD-10-CM | POA: Diagnosis not present

## 2018-07-09 DIAGNOSIS — I890 Lymphedema, not elsewhere classified: Secondary | ICD-10-CM | POA: Diagnosis not present

## 2018-07-09 DIAGNOSIS — H04123 Dry eye syndrome of bilateral lacrimal glands: Secondary | ICD-10-CM | POA: Diagnosis not present

## 2018-07-09 DIAGNOSIS — I129 Hypertensive chronic kidney disease with stage 1 through stage 4 chronic kidney disease, or unspecified chronic kidney disease: Secondary | ICD-10-CM | POA: Diagnosis not present

## 2018-07-14 ENCOUNTER — Ambulatory Visit: Payer: Medicare Other

## 2018-07-14 DIAGNOSIS — E1142 Type 2 diabetes mellitus with diabetic polyneuropathy: Secondary | ICD-10-CM | POA: Diagnosis not present

## 2018-07-14 DIAGNOSIS — Z923 Personal history of irradiation: Secondary | ICD-10-CM | POA: Diagnosis not present

## 2018-07-14 DIAGNOSIS — K5710 Diverticulosis of small intestine without perforation or abscess without bleeding: Secondary | ICD-10-CM | POA: Diagnosis not present

## 2018-07-14 DIAGNOSIS — Z6822 Body mass index (BMI) 22.0-22.9, adult: Secondary | ICD-10-CM | POA: Diagnosis not present

## 2018-07-14 DIAGNOSIS — Z794 Long term (current) use of insulin: Secondary | ICD-10-CM | POA: Diagnosis not present

## 2018-07-14 DIAGNOSIS — H04123 Dry eye syndrome of bilateral lacrimal glands: Secondary | ICD-10-CM | POA: Diagnosis not present

## 2018-07-14 DIAGNOSIS — N183 Chronic kidney disease, stage 3 (moderate): Secondary | ICD-10-CM | POA: Diagnosis not present

## 2018-07-14 DIAGNOSIS — E1122 Type 2 diabetes mellitus with diabetic chronic kidney disease: Secondary | ICD-10-CM | POA: Diagnosis not present

## 2018-07-14 DIAGNOSIS — H353220 Exudative age-related macular degeneration, left eye, stage unspecified: Secondary | ICD-10-CM | POA: Diagnosis not present

## 2018-07-14 DIAGNOSIS — I129 Hypertensive chronic kidney disease with stage 1 through stage 4 chronic kidney disease, or unspecified chronic kidney disease: Secondary | ICD-10-CM | POA: Diagnosis not present

## 2018-07-14 DIAGNOSIS — N132 Hydronephrosis with renal and ureteral calculous obstruction: Secondary | ICD-10-CM | POA: Diagnosis not present

## 2018-07-14 DIAGNOSIS — Z9221 Personal history of antineoplastic chemotherapy: Secondary | ICD-10-CM | POA: Diagnosis not present

## 2018-07-14 DIAGNOSIS — M1991 Primary osteoarthritis, unspecified site: Secondary | ICD-10-CM | POA: Diagnosis not present

## 2018-07-14 DIAGNOSIS — Z9181 History of falling: Secondary | ICD-10-CM | POA: Diagnosis not present

## 2018-07-14 DIAGNOSIS — E039 Hypothyroidism, unspecified: Secondary | ICD-10-CM | POA: Diagnosis not present

## 2018-07-14 DIAGNOSIS — I890 Lymphedema, not elsewhere classified: Secondary | ICD-10-CM | POA: Diagnosis not present

## 2018-07-14 DIAGNOSIS — Z853 Personal history of malignant neoplasm of breast: Secondary | ICD-10-CM | POA: Diagnosis not present

## 2018-07-14 NOTE — Telephone Encounter (Signed)
Patient called in states she does not want to come to today's appt 2/2 rainy weather. Appt r/s for 07/20/2018 at 2:15. Hubbard Hartshorn, RN, BSN

## 2018-07-17 DIAGNOSIS — H353220 Exudative age-related macular degeneration, left eye, stage unspecified: Secondary | ICD-10-CM | POA: Diagnosis not present

## 2018-07-17 DIAGNOSIS — Z794 Long term (current) use of insulin: Secondary | ICD-10-CM | POA: Diagnosis not present

## 2018-07-17 DIAGNOSIS — E039 Hypothyroidism, unspecified: Secondary | ICD-10-CM | POA: Diagnosis not present

## 2018-07-17 DIAGNOSIS — H04123 Dry eye syndrome of bilateral lacrimal glands: Secondary | ICD-10-CM | POA: Diagnosis not present

## 2018-07-17 DIAGNOSIS — E1142 Type 2 diabetes mellitus with diabetic polyneuropathy: Secondary | ICD-10-CM | POA: Diagnosis not present

## 2018-07-17 DIAGNOSIS — N183 Chronic kidney disease, stage 3 (moderate): Secondary | ICD-10-CM | POA: Diagnosis not present

## 2018-07-17 DIAGNOSIS — K5710 Diverticulosis of small intestine without perforation or abscess without bleeding: Secondary | ICD-10-CM | POA: Diagnosis not present

## 2018-07-17 DIAGNOSIS — I129 Hypertensive chronic kidney disease with stage 1 through stage 4 chronic kidney disease, or unspecified chronic kidney disease: Secondary | ICD-10-CM | POA: Diagnosis not present

## 2018-07-17 DIAGNOSIS — I890 Lymphedema, not elsewhere classified: Secondary | ICD-10-CM | POA: Diagnosis not present

## 2018-07-17 DIAGNOSIS — Z9181 History of falling: Secondary | ICD-10-CM | POA: Diagnosis not present

## 2018-07-17 DIAGNOSIS — Z9221 Personal history of antineoplastic chemotherapy: Secondary | ICD-10-CM | POA: Diagnosis not present

## 2018-07-17 DIAGNOSIS — M1991 Primary osteoarthritis, unspecified site: Secondary | ICD-10-CM | POA: Diagnosis not present

## 2018-07-17 DIAGNOSIS — Z6822 Body mass index (BMI) 22.0-22.9, adult: Secondary | ICD-10-CM | POA: Diagnosis not present

## 2018-07-17 DIAGNOSIS — Z923 Personal history of irradiation: Secondary | ICD-10-CM | POA: Diagnosis not present

## 2018-07-17 DIAGNOSIS — E1122 Type 2 diabetes mellitus with diabetic chronic kidney disease: Secondary | ICD-10-CM | POA: Diagnosis not present

## 2018-07-17 DIAGNOSIS — Z853 Personal history of malignant neoplasm of breast: Secondary | ICD-10-CM | POA: Diagnosis not present

## 2018-07-17 DIAGNOSIS — N132 Hydronephrosis with renal and ureteral calculous obstruction: Secondary | ICD-10-CM | POA: Diagnosis not present

## 2018-07-20 ENCOUNTER — Ambulatory Visit: Payer: Medicare Other

## 2018-07-20 DIAGNOSIS — H353220 Exudative age-related macular degeneration, left eye, stage unspecified: Secondary | ICD-10-CM | POA: Diagnosis not present

## 2018-07-20 DIAGNOSIS — E039 Hypothyroidism, unspecified: Secondary | ICD-10-CM | POA: Diagnosis not present

## 2018-07-20 DIAGNOSIS — Z923 Personal history of irradiation: Secondary | ICD-10-CM | POA: Diagnosis not present

## 2018-07-20 DIAGNOSIS — I890 Lymphedema, not elsewhere classified: Secondary | ICD-10-CM | POA: Diagnosis not present

## 2018-07-20 DIAGNOSIS — Z9221 Personal history of antineoplastic chemotherapy: Secondary | ICD-10-CM | POA: Diagnosis not present

## 2018-07-20 DIAGNOSIS — N132 Hydronephrosis with renal and ureteral calculous obstruction: Secondary | ICD-10-CM | POA: Diagnosis not present

## 2018-07-20 DIAGNOSIS — Z794 Long term (current) use of insulin: Secondary | ICD-10-CM | POA: Diagnosis not present

## 2018-07-20 DIAGNOSIS — E1142 Type 2 diabetes mellitus with diabetic polyneuropathy: Secondary | ICD-10-CM | POA: Diagnosis not present

## 2018-07-20 DIAGNOSIS — Z853 Personal history of malignant neoplasm of breast: Secondary | ICD-10-CM | POA: Diagnosis not present

## 2018-07-20 DIAGNOSIS — K5710 Diverticulosis of small intestine without perforation or abscess without bleeding: Secondary | ICD-10-CM | POA: Diagnosis not present

## 2018-07-20 DIAGNOSIS — Z9181 History of falling: Secondary | ICD-10-CM | POA: Diagnosis not present

## 2018-07-20 DIAGNOSIS — I129 Hypertensive chronic kidney disease with stage 1 through stage 4 chronic kidney disease, or unspecified chronic kidney disease: Secondary | ICD-10-CM | POA: Diagnosis not present

## 2018-07-20 DIAGNOSIS — N183 Chronic kidney disease, stage 3 (moderate): Secondary | ICD-10-CM | POA: Diagnosis not present

## 2018-07-20 DIAGNOSIS — Z6822 Body mass index (BMI) 22.0-22.9, adult: Secondary | ICD-10-CM | POA: Diagnosis not present

## 2018-07-20 DIAGNOSIS — E1122 Type 2 diabetes mellitus with diabetic chronic kidney disease: Secondary | ICD-10-CM | POA: Diagnosis not present

## 2018-07-20 DIAGNOSIS — H04123 Dry eye syndrome of bilateral lacrimal glands: Secondary | ICD-10-CM | POA: Diagnosis not present

## 2018-07-20 DIAGNOSIS — M1991 Primary osteoarthritis, unspecified site: Secondary | ICD-10-CM | POA: Diagnosis not present

## 2018-07-21 DIAGNOSIS — E039 Hypothyroidism, unspecified: Secondary | ICD-10-CM | POA: Diagnosis not present

## 2018-07-21 DIAGNOSIS — Z923 Personal history of irradiation: Secondary | ICD-10-CM | POA: Diagnosis not present

## 2018-07-21 DIAGNOSIS — H04123 Dry eye syndrome of bilateral lacrimal glands: Secondary | ICD-10-CM | POA: Diagnosis not present

## 2018-07-21 DIAGNOSIS — N132 Hydronephrosis with renal and ureteral calculous obstruction: Secondary | ICD-10-CM | POA: Diagnosis not present

## 2018-07-21 DIAGNOSIS — M1991 Primary osteoarthritis, unspecified site: Secondary | ICD-10-CM | POA: Diagnosis not present

## 2018-07-21 DIAGNOSIS — E1142 Type 2 diabetes mellitus with diabetic polyneuropathy: Secondary | ICD-10-CM | POA: Diagnosis not present

## 2018-07-21 DIAGNOSIS — I890 Lymphedema, not elsewhere classified: Secondary | ICD-10-CM | POA: Diagnosis not present

## 2018-07-21 DIAGNOSIS — H353220 Exudative age-related macular degeneration, left eye, stage unspecified: Secondary | ICD-10-CM | POA: Diagnosis not present

## 2018-07-21 DIAGNOSIS — Z6822 Body mass index (BMI) 22.0-22.9, adult: Secondary | ICD-10-CM | POA: Diagnosis not present

## 2018-07-21 DIAGNOSIS — Z9181 History of falling: Secondary | ICD-10-CM | POA: Diagnosis not present

## 2018-07-21 DIAGNOSIS — I129 Hypertensive chronic kidney disease with stage 1 through stage 4 chronic kidney disease, or unspecified chronic kidney disease: Secondary | ICD-10-CM | POA: Diagnosis not present

## 2018-07-21 DIAGNOSIS — Z853 Personal history of malignant neoplasm of breast: Secondary | ICD-10-CM | POA: Diagnosis not present

## 2018-07-21 DIAGNOSIS — E1122 Type 2 diabetes mellitus with diabetic chronic kidney disease: Secondary | ICD-10-CM | POA: Diagnosis not present

## 2018-07-21 DIAGNOSIS — K5710 Diverticulosis of small intestine without perforation or abscess without bleeding: Secondary | ICD-10-CM | POA: Diagnosis not present

## 2018-07-21 DIAGNOSIS — Z794 Long term (current) use of insulin: Secondary | ICD-10-CM | POA: Diagnosis not present

## 2018-07-21 DIAGNOSIS — N183 Chronic kidney disease, stage 3 (moderate): Secondary | ICD-10-CM | POA: Diagnosis not present

## 2018-07-21 DIAGNOSIS — Z9221 Personal history of antineoplastic chemotherapy: Secondary | ICD-10-CM | POA: Diagnosis not present

## 2018-08-04 ENCOUNTER — Telehealth: Payer: Self-pay | Admitting: Internal Medicine

## 2018-08-04 NOTE — Telephone Encounter (Signed)
Pls return call for [phone]

## 2018-08-14 DIAGNOSIS — Z794 Long term (current) use of insulin: Secondary | ICD-10-CM | POA: Diagnosis not present

## 2018-08-14 DIAGNOSIS — E118 Type 2 diabetes mellitus with unspecified complications: Secondary | ICD-10-CM | POA: Diagnosis not present

## 2018-08-18 ENCOUNTER — Telehealth: Payer: Self-pay | Admitting: Dietician

## 2018-08-18 NOTE — Telephone Encounter (Signed)
I was notified that [name] received her Dexcom CGM kit. She confirms this and wants to come in for training on the same day she sees Dr. Dareen Piano. Her call was transferred to the front office to schedule these appointments.

## 2018-08-24 ENCOUNTER — Telehealth: Payer: Self-pay | Admitting: Internal Medicine

## 2018-08-24 ENCOUNTER — Inpatient Hospital Stay: Payer: Medicare Other | Admitting: Adult Health

## 2018-08-24 NOTE — Telephone Encounter (Signed)
[facility] RN calling in reference to pt's D/C.  Pt did not answer the door.  Pt's goals have been met.

## 2018-08-24 NOTE — Progress Notes (Deleted)
CLINIC:  Survivorship   REASON FOR VISIT:  Routine follow-up for history of breast cancer.   BRIEF ONCOLOGIC HISTORY:  Per Dr. Calton Dach last note in 07/2015: This patient was diagnosed with breast cancer of the incidental finding of a lump on her chest when she presented to her doctor with coughing. I do not have all the available records. She was initially diagnosed with a Stage III B lymph node positive cancer of the right breast in April of 1999, treated with induction chemotherapy followed by a right mastectomy then radiation and additional chemotherapy. She achieved a durable response with no evidence for recurrence. Due to her young age, she had genetic counselor and was tested negative for BRCA mutation. She never received adjuvant tamoxifen because she recalled that he was issued a receptor negative. The patient has 4 children and her first child was born at age of 77. She never breast-feed any of her children. She did not recall taking hormone replacement or birth control pill. She had a partial hysterectomy at a young age due to heavy menstruation.    INTERVAL HISTORY:  Ms. Creasey presents to the Survivorship [facility] today for routine follow-up for her history of breast cancer.      Since her last visit, she underwent left breast mammogram and ultrasound in 10/2017 due to a palpable concern that Edelyn had noticed.  This showed no evidence of malignancy and the palpable abnormality was determined to be caused by rib cartilage.  She was recommended repeat left breast screening mammogram in 10/2018.  She had breast density category B.    REVIEW OF SYSTEMS:  Review of Systems - Oncology  Breast: Denies any new nodularity, masses, tenderness, nipple changes, or nipple discharge.       PAST MEDICAL/SURGICAL HISTORY:  Past Medical History:  Diagnosis Date  . Arthritis   . Borderline glaucoma of both eyes   . Depression   . Diabetic polyneuropathy ([place])   . Diverticulosis  of colon   . Dry eyes, bilateral   . Feeling of incomplete bladder emptying   . History of breast cancer oncologist-  dr Waymon Budge-- per lov note no recurrence   dx 04/ 1999 --- Stage 3B-- s/p  chemotherapy then right mastectomy then concurrent chemoradiation therapy  . History of urinary retention    01/ 2018  . Hydronephrosis of right kidney   . Hyperlipidemia   . Hypertension   . Hypothyroidism   . Insulin dependent type 2 diabetes mellitus [facility])    endocrinologist-  dr Cruzita Lederer---  last A1c 8.7 on 02-20-2016  . Lymphedema of upper extremity    right  . Macular degeneration, left eye    followed by dr Zigmund Daniel  . Renal insufficiency   . Urgency of urination    Past Surgical History:  Procedure Laterality Date  . CARPAL TUNNEL RELEASE Right 2017   and tendon repair  . CATARACT EXTRACTION W/ INTRAOCULAR LENS  IMPLANT, BILATERAL  2017  . CYSTO/  RIGHT RETROGRADE PYELOGRAM/  UNROOFING RIGHT URETEROCELE  09/18/2000  . CYSTOSCOPY/RETROGRADE/URETEROSCOPY Right 05/09/2016   Procedure: CYSTOSCOPY and right RETROGRADE;  Surgeon: Irine Seal, MD;  Location: [facility];  Service: Urology;  Laterality: Right;  . FINGER SURGERY Left x2 prior to 09-09-2014  . I&D EXTREMITY Left 09/09/2014   Procedure: IRRIGATION AND DEBRIDEMENT LEFT THUMB DISTAL PHALANX;  Surgeon: Daryll Brod, MD;  Location: [facility];  Service: Orthopedics;  Laterality: Left;  [name] MASTECTOMY Right 1999   w/  Node dissection's  . PORT-A-CATH REMOVAL  05/01/1999  . TUBAL LIGATION    . VAGINAL HYSTERECTOMY  1970's  . WRIST GANGLION EXCISION Right 2016     ALLERGIES:  Allergies  Allergen Reactions  . Gabapentin Other (See Comments)    Dizziness from 300 mg, tolerates 100 mg Dizziness from 300 mg, tolerates 100 mg   . Meclizine Hcl Rash  . Penicillins Rash    Has patient had a PCN reaction causing immediate rash, facial/tongue/throat swelling, SOB or lightheadedness with hypotension: Yes  Has patient had a PCN reaction causing severe rash involving mucus membranes or skin necrosis: No Has patient had a PCN reaction that required hospitalization: pt was in hospital at time of reaction Has patient had a PCN reaction occurring within the last 10 years: No If all of the above answers are "NO", then may proceed with Cephalosporin use.  . Sulfa Antibiotics Rash     CURRENT MEDICATIONS:  Outpatient Encounter Medications as of 08/24/2018  Medication Sig Note  . acetaminophen (MAPAP) 325 MG tablet Mapap (acetaminophen) 325 mg tablet   . ALPHAGAN P 0.1 % SOLN Place 1 drop into both eyes 3 (three) times daily. 06/27/2017: LF 06/09/17 45 ds  . aspirin EC 81 MG tablet Take 81 mg by mouth at bedtime.   [name] Besifloxacin HCl (BESIVANCE) 0.6 % SUSP Besivance 0.6 % eye drops,suspension  INSTILL 1 DROP INTO THE LEFT EYE QID X 2 DAYS AFTER EACH MONTHLY EYE INJECTION   . busPIRone (BUSPAR) 5 MG tablet Take 1 tablet (5 mg total) by mouth 2 (two) times daily.   . calcium citrate-vitamin D (CITRACAL+D) 315-200 MG-UNIT tablet Take 1 tablet by mouth 2 (two) times daily. (Patient not taking: Reported on 11/20/2017)   . Cholecalciferol (VITAMIN D3) 2000 units TABS Take 2,000 Units at bedtime by mouth.    . ciprofloxacin (CILOXAN) 0.3 % ophthalmic ointment Ciloxan 0.3 % eye ointment  APPLY TO LEFT EYE Q 2 H WHILE AWAKE   . Continuous Blood Gluc Receiver (DEXCOM G6 RECEIVER) DEVI 1 each by Does not apply route 4 (four) times daily.   . Continuous Blood Gluc Sensor (DEXCOM G6 SENSOR) MISC 1 each by Does not apply route 4 (four) times daily.   . Continuous Blood Gluc Transmit (DEXCOM G6 TRANSMITTER) MISC 1 each by Does not apply route 4 (four) times daily.   . cycloSPORINE (RESTASIS) 0.05 % ophthalmic emulsion Restasis MultiDose 0.05 % eye drops  INT 1 GTT IN EACH EYE BID   . erythromycin ophthalmic ointment APPLY A SMALL AMOUNT INTO LEFT EYE TID   . glucose 4 GM chewable tablet Chew 4 tablets (16 g total)  by mouth as needed for low blood sugar.   [name] glucose blood (ONETOUCH VERIO) test strip CHECK BLOOD SUGAR before meals and bedtime   . ibuprofen (ADVIL,MOTRIN) 400 MG tablet ibuprofen 400 mg tablet   . insulin degludec (TRESIBA FLEXTOUCH) 100 UNIT/ML SOPN FlexTouch Pen Inject 0.16 mLs (16 Units total) into the skin daily.   . insulin lispro (HUMALOG KWIKPEN) 100 UNIT/ML KwikPen Inject 0.05-0.07 mLs (5-7 Units total) into the skin 3 (three) times daily.   . Insulin Pen Needle (BD PEN NEEDLE NANO U/F) 32G X 4 MM MISC USE AS DIRECTED   . ketorolac (ACULAR) 0.5 % ophthalmic solution Place 1 drop into both eyes 3 (three) times daily. 06/27/2017: LF 06/11/17, 30 ds   . levothyroxine (SYNTHROID, LEVOTHROID) 100 MCG tablet Take 1 tablet (100 mcg total) by mouth daily.   [name]  magnesium oxide (MAG-OX) 400 (241.3 Mg) MG tablet magnesium oxide 400 mg (241.3 mg magnesium) tablet  TK 1 T PO BID   . metFORMIN (GLUCOPHAGE) 1000 MG tablet Take 1,000 mg by mouth 2 (two) times daily with a meal.   . Nutritional Supplements (GLUCERNA SNACK SHAKE) LIQD Take 1 Dose by mouth 3 (three) times daily as needed.   . Olopatadine HCl (PATADAY) 0.2 % SOLN Place 1 drop into both eyes daily as needed (dry eyes).    Glory Rosebush Delica Lancets 16X MISC Use to check blood sugar before meals and at bedtime   . Polyethyl Glycol-Propyl Glycol (SYSTANE) 0.4-0.3 % GEL ophthalmic gel Place 1 application daily as needed into both eyes (dry eyes/irritation).    . potassium chloride 20 MEQ/15ML (10%) SOLN potassium chloride 20 mEq/15 mL oral liquid  TK 15 MLS PO D PRN FOR MUSCLE CRAMPS   . prednisoLONE acetate (PRED FORTE) 1 % ophthalmic suspension    . pregabalin (LYRICA) 50 MG capsule Take 50 mg by mouth 3 (three) times daily.   . rosuvastatin (CRESTOR) 20 MG tablet TAKE 1 TABLET(20 MG) BY MOUTH DAILY   . Semaglutide,0.25 or 0.5MG/DOS, (OZEMPIC, 0.25 OR 0.5 MG/DOSE,) 2 MG/1.5ML SOPN Inject 0.5 mg into the skin once a week.   [name] UNABLE TO FIND BD  Ultra-Fine Nano Pen Needle 32 gauge x 5/32"  USE AS DIRECTED   . valACYclovir (VALTREX) 500 MG tablet TK 1 T PO QD   . venlafaxine XR (EFFEXOR-XR) 150 MG 24 hr capsule TAKE ONE CAPSULE BY MOUTH DAILY WITH BREAKFAST    Facility-Administered Encounter Medications as of 08/24/2018  Medication  . 0.9 %  sodium chloride infusion     ONCOLOGIC FAMILY HISTORY:  Family History  Problem Relation Age of Onset  . Heart disease Father   . Diabetes Father   . Stroke Sister   . Anuerysm Sister 24       brain; maternal half-sister  . Heart disease Brother   . Anuerysm Brother 41       aortic; maternal half-brother  . Breast cancer Daughter 83       negative genetic testing in 2015  . Heart attack Daughter        50-47  . Diabetes Paternal Grandmother   . Stroke Paternal Grandfather   . Anuerysm Brother        NOS type; full brother  . Fibroids Daughter        s/p hysterectomy at 64y  . Brain cancer Maternal Uncle        dx. older than 67; NOS type  . Brain cancer Cousin        maternal 1st cousin; d. early 68s; NOS type  . Deafness Paternal Uncle        prelingual    GENETIC COUNSELING/TESTING: Updated testing in 08/2015: Genetic testing was normal, and did not reveal a deleterious mutation. Additionally, no variants of uncertain significance (VUSes) were found.  Genes tested include: ATM, BARD1, BRCA1, BRCA2, BRIP1, CDH1, CHEK2, FANCC, MLH1, MSH2, MSH6, NBN, PALB2, PMS2, PTEN, RAD51C, RAD51D, TP53, and XRCC2. This panel also includes deletion/duplication analysis (without sequencing) for one gene, EPCAM.  SOCIAL HISTORY:  Social History   Socioeconomic History  . Marital status: Widowed    Spouse name: Not on file  . Number of children: Not on file  . Years of education: Not on file  . Highest education level: Not on file  Occupational History  . Not on file  Social Needs  . Financial resource strain: Not on file  . Food insecurity    Worry: Not on file    Inability: Not on  file  . Transportation needs    Medical: Not on file    Non-medical: Not on file  Tobacco Use  . Smoking status: Never Smoker  . Smokeless tobacco: Never Used  Substance and Sexual Activity  . Alcohol use: No    Alcohol/week: 0.0 standard drinks  . Drug use: No  . Sexual activity: Not on file  Lifestyle  . Physical activity    Days per week: Not on file    Minutes per session: Not on file  . Stress: Not on file  Relationships  . Social Herbalist on phone: Not on file    Gets together: Not on file    Attends religious service: Not on file    Active member of club or organization: Not on file    Attends meetings of clubs or organizations: Not on file    Relationship status: Not on file  . Intimate partner violence    Fear of current or ex partner: Not on file    Emotionally abused: Not on file    Physically abused: Not on file    Forced sexual activity: Not on file  Other Topics Concern  . Not on file  Social History Narrative  . Not on file      PHYSICAL EXAMINATION:  Vital Signs: There were no vitals filed for this visit. There were no vitals filed for this visit. General: Well-nourished, well-appearing female in no acute distress.  Unaccompanied today.   HEENT: Head is normocephalic.  Pupils equal and reactive to light. Conjunctivae clear without exudate.  Sclerae anicteric. Oral mucosa is pink, moist.  Oropharynx is pink without lesions or erythema.  Lymph: No cervical, supraclavicular, or infraclavicular lymphadenopathy noted on palpation.  Cardiovascular: Regular rate and rhythm.[name] Respiratory: Clear to auscultation bilaterally. Chest expansion symmetric; breathing non-labored.  Breast Exam:  -Left breast: No appreciable masses on palpation. No skin redness, thickening, or peau d'orange appearance; no nipple retraction or nipple discharge; -Right breast: Surgically absent, no nodularity or skin change noted -Axilla: No axillary adenopathy bilaterally.   GI: Abdomen soft and round; non-tender, non-distended. Bowel sounds normoactive. No hepatosplenomegaly.   GU: Deferred.  Neuro: No focal deficits. Steady gait.  Psych: Mood and affect normal and appropriate for situation.  MSK: No focal spinal tenderness to palpation, full range of motion in bilateral upper extremities Extremities: No edema. Skin: Warm and dry.  LABORATORY DATA:  None for this visit   DIAGNOSTIC IMAGING:  Most recent mammogram:     ASSESSMENT AND PLAN:  Ms.. Franzen is a pleasant 68 y.o. female with history of Stage IIIB right breast invasive ductal carcinoma, ER-/PR-/HER2-, diagnosed in 1999, treated with neoadjuvant chemotherapy, mastectomy, and additional chemotherapy.  She presents to the [facility]orship [facility] for surveillance and routine follow-up.   1. History of breast cancer:  Ms. Dann is currently clinically and radiographically without evidence of disease or recurrence of breast cancer. She will be due for mammogram in 10/2018.  She will return in one year for continued LTS follow up.  I did offer graduation to her PCP as well. She will consider it.  I encouraged her to call me with any questions or concerns before her next visit at the cancer center, and I would be happy to see her sooner, if needed.    2. Anxiety/grief:  She is going to f/u with the appropriate people, however I reached out to Lorrin Jackson today to talk to her.    3. Bone health:  Given Ms. Nicolaou age, history of breast cancer, she is at risk for bone demineralization.  She was given education on specific food and activities to promote bone health.  I will defer to her PCP about  4. Cancer screening:  Due to Ms. Kawabata's history and her age, she should receive screening for skin cancers, colon cancer, and gynecologic cancers. She was encouraged to follow-up with her PCP for appropriate cancer screenings.   5. Health maintenance and wellness promotion: Ms. Soberanes was encouraged to consume 5-7  servings of fruits and vegetables per day. She was also encouraged to engage in moderate to vigorous exercise for 30 minutes per day most days of the week. She was instructed to limit her alcohol consumption and continue to abstain from tobacco use.      Dispo:  -Return to cancer center in one year for LTS follow up -Mammogram in 10/2018   A total of (30) minutes of face-to-face time was spent with this patient with greater than 50% of that time in counseling and care-coordination.   Gardenia Phlegm, [place] [phone]   Note: PRIMARY CARE PROVIDER Aldine Contes, [place] [phone]

## 2018-08-25 ENCOUNTER — Encounter: Payer: Self-pay | Admitting: Dietician

## 2018-08-25 ENCOUNTER — Other Ambulatory Visit: Payer: Self-pay | Admitting: Internal Medicine

## 2018-08-25 ENCOUNTER — Ambulatory Visit (INDEPENDENT_AMBULATORY_CARE_PROVIDER_SITE_OTHER): Payer: Medicare Other | Admitting: Internal Medicine

## 2018-08-25 ENCOUNTER — Ambulatory Visit (INDEPENDENT_AMBULATORY_CARE_PROVIDER_SITE_OTHER): Payer: Medicare Other | Admitting: Dietician

## 2018-08-25 ENCOUNTER — Encounter: Payer: Self-pay | Admitting: Internal Medicine

## 2018-08-25 VITALS — BP 140/72 | HR 92 | Temp 98.9°F | Ht 62.0 in | Wt 124.2 lb

## 2018-08-25 DIAGNOSIS — N183 Chronic kidney disease, stage 3 unspecified: Secondary | ICD-10-CM

## 2018-08-25 DIAGNOSIS — E1122 Type 2 diabetes mellitus with diabetic chronic kidney disease: Secondary | ICD-10-CM | POA: Diagnosis not present

## 2018-08-25 DIAGNOSIS — R42 Dizziness and giddiness: Secondary | ICD-10-CM

## 2018-08-25 DIAGNOSIS — E1142 Type 2 diabetes mellitus with diabetic polyneuropathy: Secondary | ICD-10-CM | POA: Diagnosis not present

## 2018-08-25 DIAGNOSIS — Z79899 Other long term (current) drug therapy: Secondary | ICD-10-CM

## 2018-08-25 DIAGNOSIS — Z794 Long term (current) use of insulin: Secondary | ICD-10-CM

## 2018-08-25 DIAGNOSIS — F5104 Psychophysiologic insomnia: Secondary | ICD-10-CM

## 2018-08-25 DIAGNOSIS — I10 Essential (primary) hypertension: Secondary | ICD-10-CM

## 2018-08-25 DIAGNOSIS — Z79890 Hormone replacement therapy (postmenopausal): Secondary | ICD-10-CM

## 2018-08-25 DIAGNOSIS — F339 Major depressive disorder, recurrent, unspecified: Secondary | ICD-10-CM

## 2018-08-25 DIAGNOSIS — G4700 Insomnia, unspecified: Secondary | ICD-10-CM

## 2018-08-25 DIAGNOSIS — F32A Depression, unspecified: Secondary | ICD-10-CM

## 2018-08-25 DIAGNOSIS — E039 Hypothyroidism, unspecified: Secondary | ICD-10-CM | POA: Diagnosis not present

## 2018-08-25 DIAGNOSIS — I129 Hypertensive chronic kidney disease with stage 1 through stage 4 chronic kidney disease, or unspecified chronic kidney disease: Secondary | ICD-10-CM

## 2018-08-25 DIAGNOSIS — G629 Polyneuropathy, unspecified: Secondary | ICD-10-CM

## 2018-08-25 DIAGNOSIS — F329 Major depressive disorder, single episode, unspecified: Secondary | ICD-10-CM

## 2018-08-25 LAB — POCT GLYCOSYLATED HEMOGLOBIN (HGB A1C): Hemoglobin A1C: 7.7 % — AB (ref 4.0–5.6)

## 2018-08-25 LAB — GLUCOSE, CAPILLARY: Glucose-Capillary: 163 mg/dL — ABNORMAL HIGH (ref 70–99)

## 2018-08-25 MED ORDER — MELATONIN 1 MG PO TABS: 1.0000 mg | ORAL_TABLET | Freq: Every day | ORAL | 0 refills | Status: DC

## 2018-08-25 NOTE — Progress Notes (Signed)
   Subjective:    Patient ID: [name], female    DOB: [birth year], 68 y.o.   MRN: [id number]  HPI  I seen and examined this patient.  Patient is here for routine follow-up of her diabetes and hypertension.  Patient states that she is compliant with all her medications that overall she feels well.  She denies any acute complaints today.  She states that her blood sugars normally run in the 100s but that occasionally goes high in the 200s and 300s especially after she eats something that she knows is not good for her.  She denies any other complaints at this time.  Review of Systems  Constitutional: Negative.   HENT: Negative.   Respiratory: Negative.   Cardiovascular: Negative.   Gastrointestinal: Negative.   Musculoskeletal: Negative.   Neurological: Positive for dizziness.       Patient states that she still occasionally feels lightheaded/dizzy when she gets up suddenly  Psychiatric/Behavioral: Negative.        Objective:   Physical Exam Constitutional:      Appearance: Normal appearance.  HENT:     Head: Normocephalic and atraumatic.  Neck:     Musculoskeletal: Neck supple.  Cardiovascular:     Rate and Rhythm: Normal rate and regular rhythm.     Heart sounds: Normal heart sounds.  Pulmonary:     Effort: Pulmonary effort is normal.     Breath sounds: Normal breath sounds. No wheezing or rales.  Abdominal:     General: Bowel sounds are normal. There is no distension.     Palpations: Abdomen is soft.     Tenderness: There is no abdominal tenderness.  Musculoskeletal:        General: No swelling or tenderness.  Lymphadenopathy:     Cervical: No cervical adenopathy.  Neurological:     General: No focal deficit present.     Mental Status: She is alert and oriented to person, place, and time.  Psychiatric:        Mood and Affect: Mood normal.        Behavior: Behavior normal.           Assessment & Plan:  Please see problem based charting for assessment and  plan:

## 2018-08-25 NOTE — Assessment & Plan Note (Signed)
-  This problem is chronic and stable -Patient denies any urinary complaints at this time -We will check repeat BMP today -No further work-up at this time

## 2018-08-25 NOTE — Progress Notes (Signed)
Training for Dexcom Personal Continuous glucose monitoring: Time start:10:35  End time: 11:20 Total time: 45 minutes  [name] was educated about the Dexcom use, care and parts today. She brought 3 month's supply of sensors, a reader and a transmitter with her that she had gotten from [employer] order.  She watched several Dexcom instruction videos, put her own sensor and transmitter and set up her reader without problem. She was educated about the alarms, out of range, when to put on a new sensor and transmitter and importance of site rotation.  Call to [name], she had not looked at her reader thinking it was going to alarm. She looked while we were on the phone it was 200/202/199/200 after lunch and had the high alert showing. She agreed to call when she can schedule a follow up in 7-10 days.  Debera Lat, RD 08/25/2018 3:03 PM.

## 2018-08-25 NOTE — Assessment & Plan Note (Signed)
-  Patient states that she still has intermittent episodes of lightheadedness especially when standing up suddenly or bending over -She is now off all of her blood pressure medication secondary to orthostatic hypotension -We will not restart any antihypertensive medication at this time despite SBP 140 given her orthostasis -Patient instructed to stand up slowly and avoid sudden movements -She has not had any falls or syncope recently -We will continue to monitor closely.  Suspect that she may have some autonomic neuropathy secondary to her diabetes -No further work-up at this time

## 2018-08-25 NOTE — Patient Instructions (Signed)
-  It was a pleasure seeing you today -We will check some blood work and urine test on you -I will prescribe melatonin to help you sleep at night -Please continue to watch your diet and continue with your diabetic medications.  We will check an A1c on you today -Please follow-up with [name] today to get a demonstration on how to use your new Dexcom meter -Your blood pressure is well controlled off of any medication.  Please remember to not stand up suddenly or bend over suddenly as it may cause you to feel dizzy -Please call me with any questions or concerns

## 2018-08-25 NOTE — Assessment & Plan Note (Signed)
-  This problem is chronic and stable -Patient's last A1c in May was 8.5 -Her next A1c is due in August 15 but we will recheck this today as she will not get it checked otherwise till her next appointment in 3 months -Patient blood sugars reviewed and she has an average of 184 with highs in the 300s and lows of 70s to 54s -Patient to get her Dexcom meter today and start using that as well as will speak with [name] who is the diabetic educator today -We discussed importance of following a good diet and avoiding high carb foods.  Patient expressed understanding -She will follow-up with endocrinology as an outpatient -Continue with current regimen including Ozempic 0.5 mg weekly, Humalog 5 to 7 units 3 times daily with meals, Tresiba 60 units at night

## 2018-08-25 NOTE — Assessment & Plan Note (Signed)
-  This problem is chronic and stable -Patient is compliant with Effexor 150 mg and buspirone 5 mg twice daily -Patient's PHQ 9 score has improved to 8 today from 13 -We will continue with current regimen for now (I am uncertain how compliant she is with the BuSpar as it was last refilled at the end of April for only 1 month and she has not refilled this) -No further work-up at this time -We will continue to monitor closely

## 2018-08-25 NOTE — Assessment & Plan Note (Signed)
-  This problem is chronic and stable -Patient denies any recurrent neuropathy at this time and states that she has not been taking any Lyrica -We will DC Lyrica as she has not required this medication -I suspect that as her blood sugars have improved her peripheral neuropathy has improved as well -No further work-up at this time

## 2018-08-25 NOTE — Assessment & Plan Note (Addendum)
-  This problem is chronic  -Patient denies any symptoms of hypothyroidism currently -Patient had her Synthroid dose adjusted in May secondary to an elevated TSH -We will check a repeat TSH today -Continue with current Synthroid dose for now -Patient to follow-up with endocrinology as an outpatient

## 2018-08-25 NOTE — Assessment & Plan Note (Signed)
-  This problem is chronic and stable -Patient states that intermittently she has difficulty falling asleep at night -We will try the patient on melatonin 1 mg at night and increase if necessary -No further work-up at this time

## 2018-08-25 NOTE — Assessment & Plan Note (Signed)
-  This problem is chronic and stable -Patient's blood pressure is 140/72 today -Patient blood pressure remained stable off of all meds -Patient was initially taken off her blood pressure medications secondary to orthostatic hypotension and still complains of occasional episodes of lightheadedness when she stands up suddenly -No syncope or falls recently -We will continue to monitor her off all medications

## 2018-08-25 NOTE — Patient Instructions (Addendum)
[name],  You did a terrific job applying the [employer] sensor and transmitter andusing the reader.   Please make a follow up appointment in the next 7-10 days to learn more. You can make it on August 7 if you want help putting on at new sensor.   Please use the reader to note your blood sugar anytime of day but at least when you wake up,  before meals and bedtime and any time you hear it alert or feel it vibrate.  Thank you for allowing me to help you!  [name] [phone]

## 2018-08-26 ENCOUNTER — Telehealth: Payer: Self-pay | Admitting: Internal Medicine

## 2018-08-26 DIAGNOSIS — E039 Hypothyroidism, unspecified: Secondary | ICD-10-CM

## 2018-08-26 LAB — BMP8+ANION GAP
Anion Gap: 20 mmol/L — ABNORMAL HIGH (ref 10.0–18.0)
BUN/Creatinine Ratio: 10 — ABNORMAL LOW (ref 12–28)
BUN: 10 mg/dL (ref 8–27)
CO2: 22 mmol/L (ref 20–29)
Calcium: 10 mg/dL (ref 8.7–10.3)
Chloride: 101 mmol/L (ref 96–106)
Creatinine, Ser: 1.02 mg/dL — ABNORMAL HIGH (ref 0.57–1.00)
GFR calc Af Amer: 66 mL/min/{1.73_m2} (ref >59)
GFR calc non Af Amer: 57 mL/min/{1.73_m2} — ABNORMAL LOW (ref >59)
Glucose: 163 mg/dL — ABNORMAL HIGH (ref 65–99)
Potassium: 4.2 mmol/L (ref 3.5–5.2)
Sodium: 143 mmol/L (ref 134–144)

## 2018-08-26 LAB — MICROALBUMIN / CREATININE URINE RATIO
Creatinine, Urine: 152.6 mg/dL
Microalb/Creat Ratio: 42 mg/g creat — ABNORMAL HIGH (ref 0–29)
Microalbumin, Urine: 63.5 ug/mL

## 2018-08-26 LAB — TSH: TSH: 16.39 u[IU]/mL — ABNORMAL HIGH (ref 0.450–4.500)

## 2018-08-26 MED ORDER — LEVOTHYROXINE SODIUM 100 MCG PO TABS: 100.0000 ug | ORAL_TABLET | Freq: Every day | ORAL | 1 refills | Status: DC

## 2018-08-26 NOTE — Telephone Encounter (Signed)
I called the patient to discuss the results of her blood work with her.  Patient's creatinine was at baseline at 1.02 and her blood glucose is mildly elevated at 163.  Patient's TSH was elevated to 16.  I asked the patient if she was taking her Synthroid every day.  Patient says that she thought that she was but on review of her medications noted that she has been out of this medication and has not had it refilled.  I [facility]  I will also refer the patient to pharmacy (Dr. Maudie Mercury) to do a thorough medication reconciliation.

## 2018-08-27 ENCOUNTER — Telehealth: Payer: Self-pay | Admitting: Dietician

## 2018-08-27 NOTE — Telephone Encounter (Signed)
Called for Dexcom CGM support: Ms. Muldoon says she is doing good with the CGM. The alerts let her know her sugar was low twice. It was under 80 on the first alarm, the second alarm it was  54. She says she wqas half asleep and not sur what she ate. She likes the CGM and says it's better than pricking her finger. Her sugar is 93 now. We reviewed what the color of the circle and the trend arrows mean. Follow up tomorrow to review more about how to use her CGM.   Debera Lat, RD 08/27/2018 5:03 PM.

## 2018-08-29 ENCOUNTER — Other Ambulatory Visit: Payer: Self-pay | Admitting: Internal Medicine

## 2018-08-29 DIAGNOSIS — F32A Depression, unspecified: Secondary | ICD-10-CM

## 2018-08-29 DIAGNOSIS — F329 Major depressive disorder, single episode, unspecified: Secondary | ICD-10-CM

## 2018-08-31 ENCOUNTER — Telehealth: Payer: Self-pay | Admitting: Dietician

## 2018-08-31 DIAGNOSIS — C50911 Malignant neoplasm of unspecified site of right female breast: Secondary | ICD-10-CM | POA: Diagnosis not present

## 2018-08-31 NOTE — Telephone Encounter (Signed)
Checked on [name] and provided more CGM education. She reports "a lot lows today" because she was not hungry when her phone alarms set to remind her to take hr Humlog went off, ate 1/2 banana and took 6 units of Humalog. Drank water with sugar when CGM alarms went off, then slept CBGS were 54-60s-104- 133 and rising now after she ate cheerios several hours after her Humalog. Advised not to take  Humalog (even if alarms to take it go off) unless she can eat at least 2 to 3 servings of foods that make her blood sugar go up.She says she could not have eaten more with her banana this morning.  She verbalized understanding.   Will follow up again by phone and encourage in office for sensor change on 09/04/18

## 2018-09-03 NOTE — Telephone Encounter (Signed)
[name] says she will have her family help her change her Dexcom sensor tomorrow. She agrees to schedule a follow up in the 1-2 weeks to be sure she is using all of it's features and also using it appropriately for treatment decisions.

## 2018-09-04 NOTE — Telephone Encounter (Signed)
Called Ms, [name] for Dexcom CGM support- she changed her sensor bu did not stop her old one so has to calibrate her new sensor. We discussed how to calibrate and she said she already learned how to do that. Will follow up next week to teach her how to stop her sensor and enter the sensor code so she does not have to calibrate her CGM.

## 2018-09-14 DIAGNOSIS — E118 Type 2 diabetes mellitus with unspecified complications: Secondary | ICD-10-CM | POA: Diagnosis not present

## 2018-09-14 DIAGNOSIS — Z794 Long term (current) use of insulin: Secondary | ICD-10-CM | POA: Diagnosis not present

## 2018-09-17 ENCOUNTER — Telehealth: Payer: Self-pay | Admitting: Dietician

## 2018-09-17 NOTE — Telephone Encounter (Signed)
Called to follow up in CGM training and use. [name] says she couldn't get hr CGM straightened out. She has gone back to using her meter. She agreed to schedule a follow up next Thursday at 1:15PM.

## 2018-09-24 ENCOUNTER — Other Ambulatory Visit: Payer: Self-pay

## 2018-09-24 ENCOUNTER — Encounter: Payer: Self-pay | Admitting: Dietician

## 2018-09-24 ENCOUNTER — Ambulatory Visit (INDEPENDENT_AMBULATORY_CARE_PROVIDER_SITE_OTHER): Payer: Medicare Other | Admitting: Dietician

## 2018-09-24 DIAGNOSIS — Z794 Long term (current) use of insulin: Secondary | ICD-10-CM

## 2018-09-24 DIAGNOSIS — Z713 Dietary counseling and surveillance: Secondary | ICD-10-CM

## 2018-09-24 DIAGNOSIS — E1142 Type 2 diabetes mellitus with diabetic polyneuropathy: Secondary | ICD-10-CM | POA: Diagnosis not present

## 2018-09-24 NOTE — Patient Instructions (Addendum)
Change sensor on September 6th.  Don't forget to enter the 4 numbers from the sensor into your receiver  when to use your meter-  1- if symptoms do not match reading on CGM 2- if there is no arrow or no number  Call [name] for help if needed   You can call my cell number to get me on a Sunday

## 2018-09-24 NOTE — Progress Notes (Signed)
Diabetes Self Management Education and Support:   Assisted with sensor/transmitter start. continued Dexcom G6 Personal CGM Training  Start time:130    End time: 215 Total time: [place] and her granddaughter were assisted in starting a new sensor today. She has difficulty with dexterity making some parts of starting a sensor more difficult for her.   Education was continued on the G6 CGM which included the following:  -Reviewed Inserting sensor (third sensor, first sensor started here, second sensor started at home with no sensor code entered or calibration and it stopped working). Sensor was in the two hour warm up when she left the office.  -Calibrating- when needed and how -Trend arrows/graphs -Alarms and alerts Treatment decisions -Ending sensor session -Trouble shooting -Reviewed insulin dosing from dexcom.  -When to use her meter -Insulin stacking Patient has [facility] tech support and my contact information. CBG was done by patient during training on her meter with result of 305mg /dl. She reports drinking regular ginger ale (51 grams sugar) and no rapid acting insulin prior to her appointment. . She was advised to not eat until she was home and could take Humalog insulin prior to eating and to take her full lunch dose of insulin.  Plan to follow up to complete education and support her independence in changing sensor.  Debera Lat, RD 09/24/2018 3:03 PM.

## 2018-09-28 ENCOUNTER — Telehealth: Payer: Self-pay | Admitting: Dietician

## 2018-09-28 ENCOUNTER — Ambulatory Visit: Payer: Medicare Other | Admitting: Pharmacist

## 2018-09-28 NOTE — Telephone Encounter (Signed)
[name] is a 68 y.o. female who was contacted on behalf of [facility] Geriatrics Task Force.   136 right now, trend arrow straight out to my left, trend graph shows straight line in grey. 83 was lowest, the low alarm went off and red trend arrow, ate an orange.  Diabetes Assessment  DM meds and BS checks -  "What medications are you taking for diabetes?" humalog 7 for a large meal, , tresiba 16 units in am, ozempic 0.5 on sundays,metformin 1000 mg twice -  "How often do you check your blood sugars at home?" using CGM - "What have your blood sugars been?" in grey- target mostly  High Blood Pressure Assessment  BP meds & BP checks -  "What medications are you taking for high blood pressure?" no - "How often do you check your blood pressure at home?" no, machine not working - "What have your blood pressure readings been?" does not know  Coping with DM and BP - What else are you doing to help with your DM and BP - Diet? eating vegetables, oatmeal, eggs, steamed sausage, eats 2-3x a day Exercise? Walking around the house and sometimes outside  Medication Access Issues  Medication Issues? -  "Are you getting your medicines refilled on time without skipping any doses?" no - "Are you having any problems getting/taking your meds (cost, timing, transportation)?" delivered - Do you need any meds refilled? no  Conclusion  Close the call - Date of follow-up visit scheduled- 10/13/18 with Dr. Dareen Piano - "Any other questions or concerns?" no  Debera Lat, RD 09/28/2018 12:03 PM.

## 2018-09-29 ENCOUNTER — Telehealth: Payer: Self-pay | Admitting: Pharmacist

## 2018-09-30 DIAGNOSIS — C50911 Malignant neoplasm of unspecified site of right female breast: Secondary | ICD-10-CM | POA: Diagnosis not present

## 2018-10-02 NOTE — Telephone Encounter (Signed)
Patient appointment scheduled for 9/11

## 2018-10-12 ENCOUNTER — Encounter: Payer: Self-pay | Admitting: Internal Medicine

## 2018-10-13 ENCOUNTER — Encounter: Payer: Self-pay | Admitting: Dietician

## 2018-10-13 ENCOUNTER — Ambulatory Visit: Payer: Medicare Other | Admitting: Dietician

## 2018-10-13 ENCOUNTER — Encounter: Payer: Self-pay | Admitting: Internal Medicine

## 2018-10-13 ENCOUNTER — Ambulatory Visit (INDEPENDENT_AMBULATORY_CARE_PROVIDER_SITE_OTHER): Payer: Medicare Other | Admitting: Internal Medicine

## 2018-10-13 ENCOUNTER — Other Ambulatory Visit: Payer: Self-pay

## 2018-10-13 VITALS — BP 132/72 | HR 91 | Temp 98.8°F | Ht 62.0 in | Wt 125.3 lb

## 2018-10-13 DIAGNOSIS — Z79890 Hormone replacement therapy (postmenopausal): Secondary | ICD-10-CM

## 2018-10-13 DIAGNOSIS — F32A Depression, unspecified: Secondary | ICD-10-CM

## 2018-10-13 DIAGNOSIS — E039 Hypothyroidism, unspecified: Secondary | ICD-10-CM | POA: Diagnosis not present

## 2018-10-13 DIAGNOSIS — Z79899 Other long term (current) drug therapy: Secondary | ICD-10-CM

## 2018-10-13 DIAGNOSIS — E1122 Type 2 diabetes mellitus with diabetic chronic kidney disease: Secondary | ICD-10-CM | POA: Diagnosis not present

## 2018-10-13 DIAGNOSIS — E785 Hyperlipidemia, unspecified: Secondary | ICD-10-CM

## 2018-10-13 DIAGNOSIS — F329 Major depressive disorder, single episode, unspecified: Secondary | ICD-10-CM | POA: Diagnosis not present

## 2018-10-13 DIAGNOSIS — Z0000 Encounter for general adult medical examination without abnormal findings: Secondary | ICD-10-CM

## 2018-10-13 DIAGNOSIS — N183 Chronic kidney disease, stage 3 unspecified: Secondary | ICD-10-CM

## 2018-10-13 DIAGNOSIS — I10 Essential (primary) hypertension: Secondary | ICD-10-CM

## 2018-10-13 DIAGNOSIS — E1142 Type 2 diabetes mellitus with diabetic polyneuropathy: Secondary | ICD-10-CM

## 2018-10-13 DIAGNOSIS — Z23 Encounter for immunization: Secondary | ICD-10-CM

## 2018-10-13 DIAGNOSIS — D649 Anemia, unspecified: Secondary | ICD-10-CM

## 2018-10-13 DIAGNOSIS — Z853 Personal history of malignant neoplasm of breast: Secondary | ICD-10-CM

## 2018-10-13 DIAGNOSIS — Z794 Long term (current) use of insulin: Secondary | ICD-10-CM

## 2018-10-13 DIAGNOSIS — G629 Polyneuropathy, unspecified: Secondary | ICD-10-CM

## 2018-10-13 MED ORDER — TRESIBA FLEXTOUCH 100 UNIT/ML ~~LOC~~ SOPN: 16.0000 [IU] | PEN_INJECTOR | Freq: Every day | SUBCUTANEOUS | 1 refills | Status: DC

## 2018-10-13 MED ORDER — OZEMPIC (0.25 OR 0.5 MG/DOSE) 2 MG/1.5ML ~~LOC~~ SOPN: 1.0000 mg | PEN_INJECTOR | SUBCUTANEOUS | 5 refills | Status: DC

## 2018-10-13 NOTE — Assessment & Plan Note (Signed)
-  This problem is chronic and stable -Patient does complain of some mild numbness in her right hand and also has difficulty opening closing this hand -I will refer the patient to physical therapy for exercises for her right hand -I suspect that some of the difficulty with the tightness in her hands may be secondary to uncontrolled hypothyroidism.  We will follow-up her TSH today -No further work-up at this time

## 2018-10-13 NOTE — Assessment & Plan Note (Addendum)
-  Patient denies any history of hypothyroidism currently but I suspect that she may not be compliant with her medication -She does have some difficulty opening closing her right hand and complains of some tightness in her hands which may be secondary to uncontrolled hypothyroidism -She had an elevated TSH of 16 in July -We will continue with Synthroid 100 mcg for now and repeat TSH today -No further work-up at this time.  Patient to follow-up with Dr. Renne Crigler as an outpatient

## 2018-10-13 NOTE — Progress Notes (Signed)
   Subjective:    Patient ID: [name], female    DOB: 03-Oct-1950, 68 y.o.   MRN: [id number]  HPI  I have seen and examined this patient.  Patient is here for routine follow-up of hypertension and diabetes.  Patient states that her appetite has not been great but she has been eating her meals.  She is also having issues with her Dexcom meter and is following with [name] for this.  She also complains of difficulty opening closing her right hand and some numbness and tingling in her right hand.  She denies any other complaints at this time and is compliant with her medications.   Review of Systems  Constitutional: Negative for activity change, fever and unexpected weight change.       Patient complains of decreased appetite  HENT: Negative.   Respiratory: Negative.   Cardiovascular: Negative.   Gastrointestinal: Negative.   Musculoskeletal: Negative for arthralgias, joint swelling and myalgias.       Patient complains of difficulty opening and closing her right hand (states it feels tight)  Neurological: Negative.   Psychiatric/Behavioral: Negative.        Objective:   Physical Exam Constitutional:      Appearance: Normal appearance.  HENT:     Head: Normocephalic and atraumatic.  Neck:     Musculoskeletal: Neck supple.  Cardiovascular:     Rate and Rhythm: Normal rate and regular rhythm.     Heart sounds: Normal heart sounds.  Pulmonary:     Effort: Pulmonary effort is normal.     Breath sounds: Normal breath sounds. No wheezing or rales.  Abdominal:     General: Bowel sounds are normal. There is no distension.     Palpations: Abdomen is soft.     Tenderness: There is no abdominal tenderness.  Musculoskeletal:        General: No swelling or tenderness.     Comments: Patient with some difficulty in opening and closing her right hand  Lymphadenopathy:     Cervical: No cervical adenopathy.  Neurological:     General: No focal deficit present.     Mental Status: She is  alert and oriented to person, place, and time.  Psychiatric:        Mood and Affect: Mood normal.        Behavior: Behavior normal.           Assessment & Plan:  Please see problem based charting for assessment and plan:

## 2018-10-13 NOTE — Assessment & Plan Note (Signed)
-  This problem is chronic and stable -Patient blood pressure is at goal off of all blood pressure medications -We will continue to monitor off meds for now -Patient did have issues with orthostatic hypotension while on her blood pressure medications -No history of syncope or recent falls -No further work-up at this time

## 2018-10-13 NOTE — Assessment & Plan Note (Signed)
-  We will give the patient a flu shot today -I have also put an order for follow-up mammogram which is due in October

## 2018-10-13 NOTE — Patient Instructions (Addendum)
PLease schedule an appointment with Dr. Cruzita Lederer  Your blood sugar is higher in the afternoon and evening  Please be sure to take HUMALOG insulin before lunch and dinner.   See you September 25 at [place]

## 2018-10-13 NOTE — Patient Instructions (Signed)
-  It was a pleasure seeing you today -Please increase Ozempic 1 mg weekly -I will refer you to physical therapy for your right hand -Please make an appointment with Dr. Cruzita Lederer for your diabetes follow-up -Please follow-up with the survivor clinic for breast cancer -Please follow-up with [facility] for your feet -Please call me if you have any questions or concerns -We will check some blood work on you today -You are also due for a mammogram in October.  Please make an appointment for follow-up with them -We will give you a flu shot today

## 2018-10-13 NOTE — Assessment & Plan Note (Signed)
-  This problem is chronic and stable -We will continue with rosuvastatin 20 mg daily -We will recheck lipid panel today -No further work-up at this time

## 2018-10-13 NOTE — Assessment & Plan Note (Signed)
-  This problem is chronic and stable -Patient to schedule appointment for follow-up mammogram in October -Patient missed her appointment with the survivorship clinic and will call them to make a follow-up appointment -No further work-up at this time

## 2018-10-13 NOTE — Assessment & Plan Note (Signed)
-  This problem is chronic and stable -Patient denies any symptoms of fatigue -We will check repeat CBC today -No further work-up at this time

## 2018-10-13 NOTE — Assessment & Plan Note (Addendum)
-  This problem is chronic and stable -Patient denies any urinary complaints -We will check a BMP today -No further work-up at this time

## 2018-10-13 NOTE — Assessment & Plan Note (Addendum)
-  This problem is chronic and stable -Patient has a PHQ 9 score of 7 today which is stable from her last visit -We will continue with Effexor 150 mg daily as well as BuSpar 5 mg twice daily -I have sent an in basket message to our behavioral health counselor to follow-up with the patient given her symptoms of decreased appetite as well as possibly worsening depression -No further work-up at this time

## 2018-10-13 NOTE — Assessment & Plan Note (Addendum)
-  This problem is chronic and stable -Patient's last A1c in July was 7.7 -Patient has been having issues with her Dexcom meter and is meeting with [name] for further education in the use of this meter -Patient blood sugars were reviewed today and she has highs in the 300s and lows in the 70s.  Only approximately half of her blood sugars were within target range and a quarter were in the high range -We do not have blood sugars since 6 September since she was having issues with her meter -Continue with metformin 1000 mg twice daily -We will continue with Tresiba 16 units as well as pre-meal Humalog 5 to 7 units. -We will increase her Ozempic to 1 mg weekly subcu -We will have patient follow-up with Dr. Cruzita Lederer as an outpatient -Patient to make appointment with podiatry for follow-up given calluses on her feet

## 2018-10-13 NOTE — Progress Notes (Signed)
Documentation:  Assisted [name] in removing her Dexcom G6 sensor placed in August and placing a new sensor. She is not confident she can place the next one and requests an appointment for that. Her dexterity I limited in her hands and this makes the tasks needed to place the sensor more difficult for her. Her blood sugar after the 2 hour warm up was 366. She report no food or beverages with sugar today and taking both her Tyler Aas and her Ozempic this am.  She says her daughter and grandadughtyer are living with her and may be abel to help her with her medicines and CGM. She had a doctor's appointment today, so will defer further training to her next appointment.  P: ask her to bring her medicines and take during our next office visit. Follow up in 10 days for assistance with next sensor placement.  Debera Lat, RD 10/13/2018 2:06 PM.

## 2018-10-14 ENCOUNTER — Telehealth: Payer: Self-pay | Admitting: Internal Medicine

## 2018-10-14 LAB — CBC WITH DIFFERENTIAL/PLATELET
Basophils Absolute: 0 10*3/uL (ref 0.0–0.2)
Basos: 0 %
EOS (ABSOLUTE): 0.1 10*3/uL (ref 0.0–0.4)
Eos: 1 %
Hematocrit: 36.6 % (ref 34.0–46.6)
Hemoglobin: 12.1 g/dL (ref 11.1–15.9)
Immature Grans (Abs): 0.1 10*3/uL (ref 0.0–0.1)
Immature Granulocytes: 1 %
Lymphocytes Absolute: 1.8 10*3/uL (ref 0.7–3.1)
Lymphs: 21 %
MCH: 31.6 pg (ref 26.6–33.0)
MCHC: 33.1 g/dL (ref 31.5–35.7)
MCV: 96 fL (ref 79–97)
Monocytes Absolute: 0.4 10*3/uL (ref 0.1–0.9)
Monocytes: 5 %
Neutrophils Absolute: 6.1 10*3/uL (ref 1.4–7.0)
Neutrophils: 72 %
Platelets: 281 10*3/uL (ref 150–450)
RBC: 3.83 x10E6/uL (ref 3.77–5.28)
RDW: 12.5 % (ref 11.7–15.4)
WBC: 8.5 10*3/uL (ref 3.4–10.8)

## 2018-10-14 LAB — LIPID PANEL
Chol/HDL Ratio: 4.1 ratio (ref 0.0–4.4)
Cholesterol, Total: 228 mg/dL — ABNORMAL HIGH (ref 100–199)
HDL: 55 mg/dL (ref >39)
LDL Chol Calc (NIH): 154 mg/dL — ABNORMAL HIGH (ref 0–99)
Triglycerides: 108 mg/dL (ref 0–149)
VLDL Cholesterol Cal: 19 mg/dL (ref 5–40)

## 2018-10-14 LAB — BMP8+ANION GAP
Anion Gap: 18 mmol/L (ref 10.0–18.0)
BUN/Creatinine Ratio: 19 (ref 12–28)
BUN: 21 mg/dL (ref 8–27)
CO2: 20 mmol/L (ref 20–29)
Calcium: 9.9 mg/dL (ref 8.7–10.3)
Chloride: 102 mmol/L (ref 96–106)
Creatinine, Ser: 1.08 mg/dL — ABNORMAL HIGH (ref 0.57–1.00)
GFR calc Af Amer: 61 mL/min/{1.73_m2} (ref >59)
GFR calc non Af Amer: 53 mL/min/{1.73_m2} — ABNORMAL LOW (ref >59)
Glucose: 354 mg/dL — ABNORMAL HIGH (ref 65–99)
Potassium: 4.3 mmol/L (ref 3.5–5.2)
Sodium: 140 mmol/L (ref 134–144)

## 2018-10-14 LAB — MAGNESIUM: Magnesium: 1.7 mg/dL (ref 1.6–2.3)

## 2018-10-14 LAB — TSH: TSH: 24.9 u[IU]/mL — ABNORMAL HIGH (ref 0.450–4.500)

## 2018-10-14 NOTE — Telephone Encounter (Signed)
I called the patient to discuss the results of her blood work with her.  Patient is unsure if she is taking her cholesterol medication regularly.  I emphasized importance of taking this medication and told her that she is to take this daily.  I also asked the patient if she was taking her Synthroid regularly and she is unsure of this as well.  She states that there is no medication that she takes early in the morning before she eats or takes any other pills.  I suspect that she is not taking her Synthroid or if she is taking her Synthroid she is not taking it appropriately.  I explained to the patient the importance of taking her Synthroid 30 minutes before she eats or takes other pills.  Patient expressed understanding and states that she will take her Synthroid regularly.  I explained to her that this may be causing some of the tightness that she is feeling in her hands.  Patient's creatinine is close to her baseline.  No further work-up for now.  We will have patient follow-up in [facility] in 1 month.  Patient was also encouraged to make an appointment with her endocrinologist for follow-up for her diabetes and hypothyroidism.  Patient expresses understanding and is in agreement with plan.  I will also route this message to pharmacy to see if they can contact her about medication adherence.

## 2018-10-15 DIAGNOSIS — Z794 Long term (current) use of insulin: Secondary | ICD-10-CM | POA: Diagnosis not present

## 2018-10-15 DIAGNOSIS — E118 Type 2 diabetes mellitus with unspecified complications: Secondary | ICD-10-CM | POA: Diagnosis not present

## 2018-10-16 ENCOUNTER — Telehealth: Payer: Self-pay

## 2018-10-16 NOTE — Telephone Encounter (Signed)
Message received from [facility]:  [place], Dorette Grate, [place], [place]; [place], [place]; [place], Levada Dy; Silvano Rusk, Orvis Brill, RN  We will be able to accept this referral and are currently processing in order to schedule a start of care! Thanks!

## 2018-10-16 NOTE — Telephone Encounter (Signed)
DR. Dareen Piano  Placed an order for Home Health PT for his patient,I called the agency that she used before [facility] but they would not be able to take due to understaffing,so I wil reach out to another agency [place], [place] C9/18/20209:21 AM

## 2018-10-16 NOTE — Telephone Encounter (Signed)
No problem, Dr. Dareen Piano, appointment scheduled for 9/25. Thank you!

## 2018-10-17 NOTE — Telephone Encounter (Signed)
Thank you. I appreciate it. 

## 2018-10-19 ENCOUNTER — Telehealth: Payer: Self-pay | Admitting: *Deleted

## 2018-10-19 NOTE — Telephone Encounter (Signed)
Call from [facility]. [facility]- PT- PT start of care this week

## 2018-10-20 ENCOUNTER — Telehealth: Payer: Self-pay | Admitting: Licensed Clinical Social Worker

## 2018-10-20 ENCOUNTER — Other Ambulatory Visit: Payer: Self-pay | Admitting: Internal Medicine

## 2018-10-20 DIAGNOSIS — Z1231 Encounter for screening mammogram for malignant neoplasm of breast: Secondary | ICD-10-CM

## 2018-10-20 NOTE — Telephone Encounter (Signed)
Patient was contacted to re-establish services. Patient did not answer, but a voicemail was left for the patient to contact our office to set up a future appointment.

## 2018-10-21 ENCOUNTER — Other Ambulatory Visit: Payer: Self-pay

## 2018-10-22 ENCOUNTER — Telehealth: Payer: Self-pay | Admitting: Dietician

## 2018-10-22 NOTE — Telephone Encounter (Signed)
Ms. Puricelli called concerned about her appointments tomorrow with me to assist with sensor change and Dr. Cruzita Lederer. I assured her she could make both.  I also gave her the message from Dr. Dareen Piano to continue with 0.5 mg Ozempic weekly due to concern with her nutritional status and not needing further weight loss.

## 2018-10-23 ENCOUNTER — Ambulatory Visit (INDEPENDENT_AMBULATORY_CARE_PROVIDER_SITE_OTHER): Payer: Medicare Other | Admitting: Internal Medicine

## 2018-10-23 ENCOUNTER — Encounter: Payer: Self-pay | Admitting: Dietician

## 2018-10-23 ENCOUNTER — Ambulatory Visit: Payer: Medicare Other | Admitting: Dietician

## 2018-10-23 ENCOUNTER — Ambulatory Visit: Payer: Medicare Other | Admitting: Pharmacist

## 2018-10-23 ENCOUNTER — Other Ambulatory Visit: Payer: Self-pay

## 2018-10-23 ENCOUNTER — Telehealth: Payer: Self-pay | Admitting: Dietician

## 2018-10-23 ENCOUNTER — Encounter: Payer: Self-pay | Admitting: Internal Medicine

## 2018-10-23 DIAGNOSIS — Z794 Long term (current) use of insulin: Secondary | ICD-10-CM

## 2018-10-23 DIAGNOSIS — E1142 Type 2 diabetes mellitus with diabetic polyneuropathy: Secondary | ICD-10-CM

## 2018-10-23 MED ORDER — INSULIN LISPRO (1 UNIT DIAL) 100 UNIT/ML (KWIKPEN): 8.0000 [IU] | PEN_INJECTOR | Freq: Three times a day (TID) | SUBCUTANEOUS | 5 refills | Status: DC

## 2018-10-23 NOTE — Telephone Encounter (Signed)
Community message sent to Janae Sauce with [facility] requesting [place] date for [facility] PT. [name] called to say they were unable to admit this patient 2/2 unsafe environment. States when PT went to home it was "cluttered, dirty, no free pathways, no area free from debris, and no working lights in front room." [name] will reach out to PT to see if [facility] has been notified. Hubbard Hartshorn, BSN, RN-BC

## 2018-10-23 NOTE — Telephone Encounter (Signed)
Thank you :)

## 2018-10-23 NOTE — Progress Notes (Signed)
Patient was unable to stay for appointment, re-scheduled for 10/5

## 2018-10-23 NOTE — Patient Instructions (Addendum)
Please continue: - Metformin 1000 mg 2x a day with meals - Tresiba 20 units daily - Ozempic 0.5 mg weekly  Please increase: - Humalog: 8-10 units before meals  Please continue Levothyroxine 100  mcg daily.  Take the thyroid hormone every day, with water, at least 30 minutes before breakfast, separated by at least 4 hours from: - acid reflux medications - calcium - iron - multivitamins  Please return to see me as needed.

## 2018-10-23 NOTE — Patient Instructions (Signed)
You did great changing sensor today!  Next time we need to remember to stop your sensor before removing it.   [name] Plyler, RD 10/23/2018 10:12 AM.

## 2018-10-23 NOTE — Progress Notes (Signed)
Patient ID: [name], female   DOB: 09-12-1950, 68 y.o.   MRN: [id number]   HPI: [name] is a 68 y.o.-year-old female, returning for follow-up for DM2, dx in 1991-1992, insulin-dependent since 2000s, uncontrolled, with medication noncompliance;  + complications (DR, PN). Last visit 7 months ago.  Patient continues on a complex medication regimen and she has problems keeping up with them.  At last visit she had alarms on her phone about how to take the medicines and was doing better.  She continues to do better than in the past, but is still confused about some of the medicines.  Reviewed latest HbA1c level: Lab Results  Component Value Date   HGBA1C 7.7 (A) 08/25/2018   HGBA1C 8.5 (A) 06/11/2018   HGBA1C 7.8 (A) 03/03/2018   HGBA1C 9.3 (A) 01/08/2018   HGBA1C >14.0 (A) 10/14/2017   HGBA1C 14.0 (A) 07/08/2017   HGBA1C 12.8 (H) 06/28/2017   HGBA1C 13.0 03/14/2017   HGBA1C 11.8 (H) 12/09/2016   HGBA1C >14.0 09/03/2016   HGBA1C 7.7 05/28/2016   HGBA1C 8.7 (H) 02/20/2016   HGBA1C 11.7 12/01/2015   HGBA1C 10.7 08/29/2015   HGBA1C 8.7 06/13/2015   Pt is on a regimen of: - Metformin 1000 mg 2x a day with meals - Tresiba 20 units daily - Humalog 7 units before meals  - Ozempic 0.5 mg weekly  Pt checks her sugars more than 4 times a day per download of her Dexcom CGM:  CGM parameters: - Average from CGM: 200 +/- 78  Time in range:  - very low (40-50): 0% - low (50-70): <1% - normal range (70-180): 49% - high sugars (180-250): 24% - very high sugars (250-400): 26%    Previously: - am: 70 x1, 213-437 >> 200-300s >> 87-165 - 2h after b'fast:302, 355 >> n/c - before lunch:303 >> 200-300s >> 80-154 - 2h after lunch:  151 >> n/c >> 94, 99 - before dinner500 x1 >> 300, 507 >> n.c  - 2h after dinner:n/c >> 282 >> n/c >> 117-206, 223 - bedtime: 108, 256 >> 220 >> n/c >> 142 - nighttime:   61 x1 >> 80 >> n/c Lowest sugar was 80 >> 70 x1 >> 169 >> 60s before reducing  Tresiba dose; she has hypoglycemia awareness in the 80s Highest sugar was 500 >> 507 >> 300s >> 223.  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  -+ Mild CKD, last BUN/creatinine:  Lab Results  Component Value Date   BUN 21 10/13/2018   BUN 10 08/25/2018   CREATININE 1.08 (H) 10/13/2018   CREATININE 1.02 (H) 08/25/2018   -+ HL; last set of lipids: Lab Results  Component Value Date   CHOL 228 (H) 10/13/2018   HDL 55 10/13/2018   LDLCALC 108 (H) 10/14/2017   TRIG 108 10/13/2018   CHOLHDL 4.1 10/13/2018  On Crestor. - last eye exam was in 10/2017: + DR.  She has a history of laser surgery and intraocular injections. -+ Numbness and tingling in her feet.  She also has hypothyroidism, managed by her PCP.  Of note, she is noncompliant with her levothyroxine and her TFTs are very abnormal: Lab Results  Component Value Date   TSH 24.900 (H) 10/13/2018   TSH 16.390 (H) 08/25/2018   TSH 12.630 (H) 05/01/2018   TSH 15.53 (H) 03/03/2018   TSH 9.16 (H) 11/18/2017   TSH 10.190 (H) 10/14/2017   TSH 0.355 (L) 07/22/2017   TSH 6.763 (H) 06/27/2017  TSH 4.700 (H) 04/01/2017   TSH 4.100 12/09/2016   She is taking levothyroxine 100 mcg daily, dose recently increased by PCP.  ROS: Constitutional: no weight gain/no weight loss, no fatigue, no subjective hyperthermia, no subjective hypothermia, + decreased appetite Eyes: no blurry vision, no xerophthalmia ENT: no sore throat, no nodules palpated in neck, no dysphagia, no odynophagia, no hoarseness Cardiovascular: no CP/no SOB/no palpitations/no leg swelling Respiratory: no cough/no SOB/no wheezing Gastrointestinal: no N/no V/no D/no C/no acid reflux Musculoskeletal: no muscle aches/no joint aches Skin: no rashes, no hair loss Neurological: no tremors/+ numbness/+ tingling/no dizziness  I reviewed pt's medications, allergies, PMH, social hx, family hx, and changes were documented in the history of  present illness. Otherwise, unchanged from my initial visit note.  Past Medical History:  Diagnosis Date  . Arthritis   . Bilateral hip pain 02/24/2015  . Borderline glaucoma of both eyes   . Depression   . Diabetic polyneuropathy ([place])   . Diverticulosis of colon   . Dizziness 12/05/2017  . Dry eyes, bilateral   . Feeling of incomplete bladder emptying   . Frequent falls 01/08/2018  . History of breast cancer oncologist-  dr Waymon Budge-- per lov note no recurrence   dx 04/ 1999 --- Stage 3B-- s/p  chemotherapy then right mastectomy then concurrent chemoradiation therapy  . History of urinary retention    01/ 2018  . Hydronephrosis of right kidney   . Hyperlipidemia   . Hypertension   . Hypokalemia 12/09/2016  . Hypomagnesemia 12/09/2016  . Hypothyroidism   . Insulin dependent type 2 diabetes mellitus [facility])    endocrinologist-  dr Cruzita Lederer---  last A1c 8.7 on 02-20-2016  . Lymphedema of upper extremity    right  . Macular degeneration, left eye    followed by dr Zigmund Daniel  . Renal insufficiency   . Urgency of urination    Past Surgical History:  Procedure Laterality Date  . CARPAL TUNNEL RELEASE Right 2017   and tendon repair  . CATARACT EXTRACTION W/ INTRAOCULAR LENS  IMPLANT, BILATERAL  2017  . CYSTO/  RIGHT RETROGRADE PYELOGRAM/  UNROOFING RIGHT URETEROCELE  09/18/2000  . CYSTOSCOPY/RETROGRADE/URETEROSCOPY Right 05/09/2016   Procedure: CYSTOSCOPY and right RETROGRADE;  Surgeon: Irine Seal, MD;  Location: [facility];  Service: Urology;  Laterality: Right;  . FINGER SURGERY Left x2 prior to 09-09-2014  . I&D EXTREMITY Left 09/09/2014   Procedure: IRRIGATION AND DEBRIDEMENT LEFT THUMB DISTAL PHALANX;  Surgeon: Daryll Brod, MD;  Location: [place];  Service: Orthopedics;  Laterality: Left;  [name] MASTECTOMY Right 1999   w/  Node dissection's  . PORT-A-CATH REMOVAL  05/01/1999  . TUBAL LIGATION    . VAGINAL HYSTERECTOMY  1970's  . WRIST GANGLION  EXCISION Right 2016   Social History   Social History  . Marital status: Widow     Spouse name: N/A  . Number of children: 4   Occupational History  . Retired   Social History Main Topics  . Smoking status: Smoker  . Smokeless tobacco: Never Used  . Alcohol use yes  . Drug use: yes   Current Outpatient Medications on File Prior to Visit  Medication Sig Dispense Refill  . acetaminophen (MAPAP) 325 MG tablet Mapap (acetaminophen) 325 mg tablet    . ALPHAGAN P 0.1 % SOLN Place 1 drop into both eyes 3 (three) times daily.  2  . aspirin EC 81 MG tablet Take 81 mg by mouth at bedtime.    [name]  Besifloxacin HCl (BESIVANCE) 0.6 % SUSP Besivance 0.6 % eye drops,suspension  INSTILL 1 DROP INTO THE LEFT EYE QID X 2 DAYS AFTER EACH MONTHLY EYE INJECTION    . busPIRone (BUSPAR) 5 MG tablet Take 1 tablet (5 mg total) by mouth 2 (two) times daily. 60 tablet 0  . calcium citrate-vitamin D (CITRACAL+D) 315-200 MG-UNIT tablet Take 1 tablet by mouth 2 (two) times daily. (Patient not taking: Reported on 11/20/2017) 100 tablet 3  . Cholecalciferol (VITAMIN D3) 2000 units TABS Take 2,000 Units at bedtime by mouth.     . ciprofloxacin (CILOXAN) 0.3 % ophthalmic ointment Ciloxan 0.3 % eye ointment  APPLY TO LEFT EYE Q 2 H WHILE AWAKE    . Continuous Blood Gluc Receiver (DEXCOM G6 RECEIVER) DEVI 1 each by Does not apply route 4 (four) times daily. 1 Device 0  . Continuous Blood Gluc Sensor (DEXCOM G6 SENSOR) MISC 1 each by Does not apply route 4 (four) times daily. 3 each 12  . Continuous Blood Gluc Transmit (DEXCOM G6 TRANSMITTER) MISC 1 each by Does not apply route 4 (four) times daily. 1 each 3  . cycloSPORINE (RESTASIS) 0.05 % ophthalmic emulsion Restasis MultiDose 0.05 % eye drops  INT 1 GTT IN EACH EYE BID    . erythromycin ophthalmic ointment APPLY A SMALL AMOUNT INTO LEFT EYE TID  1  . glucose 4 GM chewable tablet Chew 4 tablets (16 g total) by mouth as needed for low blood sugar. 50 tablet 12  .  glucose blood (ONETOUCH VERIO) test strip CHECK BLOOD SUGAR before meals and bedtime 125 each 12  . insulin degludec (TRESIBA FLEXTOUCH) 100 UNIT/ML SOPN FlexTouch Pen Inject 0.16 mLs (16 Units total) into the skin at bedtime. 45 mL 1  . insulin lispro (HUMALOG KWIKPEN) 100 UNIT/ML KwikPen Inject 0.05-0.07 mLs (5-7 Units total) into the skin 3 (three) times daily. 15 mL 5  . Insulin Pen Needle (BD PEN NEEDLE NANO U/F) 32G X 4 MM MISC USE AS DIRECTED 100 each 6  . ketorolac (ACULAR) 0.5 % ophthalmic solution Place 1 drop into both eyes 3 (three) times daily.  12  . levothyroxine (SYNTHROID) 100 MCG tablet Take 1 tablet (100 mcg total) by mouth daily. 90 tablet 1  . Melatonin 1 MG TABS Take 1 tablet (1 mg total) by mouth daily. 30 tablet 0  . metFORMIN (GLUCOPHAGE) 1000 MG tablet Take 1,000 mg by mouth 2 (two) times daily with a meal.    . Nutritional Supplements (GLUCERNA SNACK SHAKE) LIQD Take 1 Dose by mouth 3 (three) times daily as needed. 30 Bottle 3  . Olopatadine HCl (PATADAY) 0.2 % SOLN Place 1 drop into both eyes daily as needed (dry eyes).     Glory Rosebush Delica Lancets [id number] MISC Use to check blood sugar before meals and at bedtime 100 each 3  . Polyethyl Glycol-Propyl Glycol (SYSTANE) 0.4-0.3 % GEL ophthalmic gel Place 1 application daily as needed into both eyes (dry eyes/irritation).     . prednisoLONE acetate (PRED FORTE) 1 % ophthalmic suspension     . rosuvastatin (CRESTOR) 20 MG tablet TAKE 1 TABLET(20 MG) BY MOUTH DAILY 90 tablet 3  . Semaglutide,0.25 or 0.5MG /DOS, (OZEMPIC, 0.25 OR 0.5 MG/DOSE,) 2 MG/1.5ML SOPN Inject 1 mg into the skin once a week. 2 pen 5  . valACYclovir (VALTREX) 500 MG tablet TK 1 T PO QD  0  . venlafaxine XR (EFFEXOR-XR) 150 MG 24 hr capsule TAKE 1 CAPSULE BY MOUTH DAILY WITH  BREAKFAST 90 capsule 0   No current facility-administered medications on file prior to visit.    Allergies  Allergen Reactions  . Gabapentin Other (See Comments)    Dizziness from 300  mg, tolerates 100 mg Dizziness from 300 mg, tolerates 100 mg   . Meclizine Hcl Rash  . Penicillins Rash    Has patient had a PCN reaction causing immediate rash, facial/tongue/throat swelling, SOB or lightheadedness with hypotension: Yes Has patient had a PCN reaction causing severe rash involving mucus membranes or skin necrosis: No Has patient had a PCN reaction that required hospitalization: pt was in hospital at time of reaction Has patient had a PCN reaction occurring within the last 10 years: No If all of the above answers are "NO", then may proceed with Cephalosporin use.  . Sulfa Antibiotics Rash   Family History  Problem Relation Age of Onset  . Heart disease Father   . Diabetes Father   . Stroke Sister   . Anuerysm Sister 73       brain; maternal half-sister  . Heart disease Brother   . Anuerysm Brother 42       aortic; maternal half-brother  . Breast cancer Daughter 4       negative genetic testing in 2015  . Heart attack Daughter        43-47  . Diabetes Paternal Grandmother   . Stroke Paternal Grandfather   . Anuerysm Brother        NOS type; full brother  . Fibroids Daughter        s/p hysterectomy at 36y  . Brain cancer Maternal Uncle        dx. older than 61; NOS type  . Brain cancer Cousin        maternal 1st cousin; d. early 54s; NOS type  . Deafness Paternal Uncle        prelingual   PE: BP 140/80   Pulse 80   Ht 5\' 2"  (1.575 m)   Wt 124 lb (56.2 kg)   SpO2 98%   BMI 22.68 kg/m  Wt Readings from Last 3 Encounters:  10/23/18 124 lb (56.2 kg)  10/13/18 125 lb 4.8 oz (56.8 kg)  08/25/18 124 lb 3.2 oz (56.3 kg)   Constitutional: normal weight, in NAD Eyes: PERRLA, EOMI, no exophthalmos ENT: moist mucous membranes, no thyromegaly, no cervical lymphadenopathy Cardiovascular: RRR, No MRG Respiratory: CTA B Gastrointestinal: abdomen soft, NT, ND, BS+ Musculoskeletal: no deformities, strength intact in all 4 Skin: moist, warm, no rashes  Neurological: no tremor with outstretched hands, DTR normal in all 4  ASSESSMENT: 1. DM2, insulin-dependent, uncontrolled, with complications - DR - PN  2. Hypothyroidism  3. HL  PLAN:  1. Patient with longstanding, uncontrolled, type 2 diabetes, on basal insulin, rapid acting insulin, metformin, and GLP-1 receptor agonist.  Latest HbA1c was even better than before, at 7.7%, which is the best she had in a very long time.  She continues to be seen frequently by PCP and by the diabetes educator about her diabetes and her CGM is frequently downloaded and analyzed.  At this visit, we reviewed the [facility] downloaded reports together. -It appears that her sugars are well controlled in the second half of the night but they increased after she starts eating, especially lunch and dinner.  Sugars then slowly decrease overnight.  We discussed that this is a sign of not enough mealtime coverage.  Since she already describes a decreased appetite, I would not suggest to  increase the Ozempic but we discussed about increasing Humalog.  She is taking Humalog now only when sugars start to increase after meals and we discussed about the absolute importance to take this 15 minutes before she eats.  She will try to do so. -Her PCP is also adjusting her diabetic regimen.  At this visit, we discussed about only continuing to follow-up with her PCP and Debera Lat, the diabetes educator.  However I will be available on an as-needed basis for her. - I suggested to:  Patient Instructions  Please continue: - Metformin 1000 mg 2x a day with meals - Tresiba 20 units daily - Ozempic 0.5 mg weekly  Please increase: - Humalog: 8-10 units before meals  Please continue Levothyroxine 100  mcg daily.  Take the thyroid hormone every day, with water, at least 30 minutes before breakfast, separated by at least 4 hours from: - acid reflux medications - calcium - iron - multivitamins  Please return to see me as needed.   - advised to check sugars at different times of the day - 4x a day, rotating check times - advised for yearly eye exams >> she is UTD - Got the flu shot this season    2. Hypothyroidism - latest thyroid labs reviewed with pt >> TSH is very high per review of recent levels from PCP. - she continues on LT4 100 mcg daily, however, it is unclear how she takes it. - we discussed multiple times in the past and I gave her written instructions about taking the thyroid hormone every day, with water, >30 minutes before breakfast, separated by >4 hours from acid reflux medications, calcium, iron, multivitamins.  Today she will have an appointment with the pharmacist to go over these instructions again -Until she starts taking the medication correctly, I would not recommend any increases in the LT4 dose  3. HL -Reviewed her most recent lipid panel from earlier this month: LDL slightly above goal, triglycerides and HDL at goal Lab Results  Component Value Date   CHOL 228 (H) 10/13/2018   HDL 55 10/13/2018   LDLCALC 108 (H) 10/14/2017   TRIG 108 10/13/2018   CHOLHDL 4.1 10/13/2018  -Continues Crestor without side effects   Philemon Kingdom, MD PhD [facility]

## 2018-10-23 NOTE — Telephone Encounter (Signed)
[name] called back to say they will make sure PT makes the referral to [facility] if he has not already. Hubbard Hartshorn, BSN, RN-BC

## 2018-10-23 NOTE — Progress Notes (Signed)
Documentation: Appointment start: 9:59 am Appointment end: 10:18 Time spent: 19 minutes Assisted [name] in changing her Dexcom G6 sensor. She needed less assistance this time. She does not think granddaughter will be abel to help her much at home. She endorsed hypoglycemic episodes. Her CGM shows lows of 66 and 64 on two adjacent days. The report also shows a pattern of highs at night. [name] denies having difficulty using the insulin pens, and says she has been taking 7 units Humalog in the evening.  Her blood sugar was 160-170 at visit today and stable. She report taking Tyler Aas this morning and only drinking water. We did not have time for her to demonstrate her technique today. Report send to Dr. Cruzita Lederer today and patient also provided with a copy.  Plan to turn on repeat low and high alarms at her next visit in 10 days for sensor change assist.  Debera Lat, RD 10/23/2018 11:11 AM.

## 2018-10-23 NOTE — Telephone Encounter (Signed)
Called to coordinate assistance in changing her CGM sensor. Left voicemail for return call

## 2018-11-02 ENCOUNTER — Ambulatory Visit: Payer: Medicare Other | Admitting: Pharmacist

## 2018-11-02 ENCOUNTER — Ambulatory Visit: Payer: Medicare Other | Admitting: Dietician

## 2018-11-02 ENCOUNTER — Telehealth: Payer: Self-pay | Admitting: Dietician

## 2018-11-02 NOTE — Telephone Encounter (Signed)
Left voicemail for return call  

## 2018-11-02 NOTE — Telephone Encounter (Signed)
Ms Columbia wants to have her grand daughter help her put her sensor and transmitter on tonight.  She'll call to update [place].

## 2018-11-05 ENCOUNTER — Telehealth: Payer: Self-pay | Admitting: Dietician

## 2018-11-05 NOTE — Telephone Encounter (Signed)
Called [name] for CGM support. She has the sensor and transistter on but missed calibrating so it is not giving her readings. Educated her about how to calibrate, she verbalized understanding. We set up an appointment for sensor change assist next week.

## 2018-11-12 ENCOUNTER — Ambulatory Visit: Payer: Medicare Other | Admitting: Dietician

## 2018-11-12 ENCOUNTER — Telehealth: Payer: Self-pay | Admitting: Dietician

## 2018-11-12 NOTE — Telephone Encounter (Signed)
Tried calling this patient. Their voicemail box is not set up. I was unable to leave a message  

## 2018-11-16 ENCOUNTER — Encounter: Payer: Self-pay | Admitting: Dietician

## 2018-11-16 NOTE — Telephone Encounter (Signed)
Thank you :)

## 2018-11-16 NOTE — Telephone Encounter (Signed)
Tried calling patient again due to missed appointment and problems using Dexcom G6 CGM. Her mail box is full and I could not leave a message. I suggest considering a PA for the [facility]. Will mail her a letter to contact the office to reschedule her appointment.

## 2018-11-19 ENCOUNTER — Telehealth: Payer: Self-pay | Admitting: Dietician

## 2018-11-19 DIAGNOSIS — E1142 Type 2 diabetes mellitus with diabetic polyneuropathy: Secondary | ICD-10-CM

## 2018-11-19 DIAGNOSIS — Z794 Long term (current) use of insulin: Secondary | ICD-10-CM

## 2018-11-19 NOTE — Telephone Encounter (Signed)
She states she is not doing well with the Dexcom CGM and cannot find her meter. She still has the sensor on that is not working because she did not calibrate. We agreed to try one more time with a new sensor and send in a prescription for a new meter.

## 2018-11-20 ENCOUNTER — Telehealth: Payer: Self-pay | Admitting: Dietician

## 2018-11-20 MED ORDER — ONETOUCH VERIO W/DEVICE KIT: PACK | 0 refills | Status: DC

## 2018-11-20 NOTE — Addendum Note (Signed)
Addended by: Resa Miner on: 11/20/2018 03:42 PM   Modules accepted: Orders

## 2018-11-20 NOTE — Telephone Encounter (Signed)
Wants to reschedule an appointment for help using her Dexcom CGM,  but does not have transportation.

## 2018-11-23 NOTE — Telephone Encounter (Signed)
I think she walks with a cane.

## 2018-11-27 ENCOUNTER — Other Ambulatory Visit: Payer: Self-pay | Admitting: Internal Medicine

## 2018-11-27 DIAGNOSIS — E039 Hypothyroidism, unspecified: Secondary | ICD-10-CM

## 2018-12-02 ENCOUNTER — Other Ambulatory Visit: Payer: Self-pay | Admitting: Dietician

## 2018-12-02 DIAGNOSIS — F32A Depression, unspecified: Secondary | ICD-10-CM

## 2018-12-02 DIAGNOSIS — F329 Major depressive disorder, single episode, unspecified: Secondary | ICD-10-CM

## 2018-12-02 DIAGNOSIS — E1142 Type 2 diabetes mellitus with diabetic polyneuropathy: Secondary | ICD-10-CM

## 2018-12-02 DIAGNOSIS — Z794 Long term (current) use of insulin: Secondary | ICD-10-CM

## 2018-12-02 NOTE — Telephone Encounter (Signed)
Needs refills on strips, pen needles, almost out of metformin.

## 2018-12-04 MED ORDER — BD PEN NEEDLE NANO U/F 32G X 4 MM MISC: 6 refills | Status: DC

## 2018-12-04 MED ORDER — METFORMIN HCL 1000 MG PO TABS: 1000.0000 mg | ORAL_TABLET | Freq: Two times a day (BID) | ORAL | 1 refills | Status: DC

## 2018-12-04 MED ORDER — VENLAFAXINE HCL ER 150 MG PO CP24: ORAL_CAPSULE | ORAL | 0 refills | Status: DC

## 2018-12-04 MED ORDER — ONETOUCH VERIO VI STRP: ORAL_STRIP | 12 refills | Status: DC

## 2018-12-07 ENCOUNTER — Ambulatory Visit: Payer: Medicare Other

## 2018-12-08 ENCOUNTER — Ambulatory Visit: Payer: Medicare Other | Admitting: Dietician

## 2018-12-08 ENCOUNTER — Encounter: Payer: Self-pay | Admitting: Internal Medicine

## 2018-12-08 ENCOUNTER — Ambulatory Visit (INDEPENDENT_AMBULATORY_CARE_PROVIDER_SITE_OTHER): Payer: Medicare Other | Admitting: Internal Medicine

## 2018-12-08 ENCOUNTER — Other Ambulatory Visit: Payer: Self-pay

## 2018-12-08 VITALS — BP 153/79 | HR 97 | Temp 99.1°F | Ht 62.0 in | Wt 130.2 lb

## 2018-12-08 DIAGNOSIS — E1122 Type 2 diabetes mellitus with diabetic chronic kidney disease: Secondary | ICD-10-CM

## 2018-12-08 DIAGNOSIS — I10 Essential (primary) hypertension: Secondary | ICD-10-CM

## 2018-12-08 DIAGNOSIS — Z9114 Patient's other noncompliance with medication regimen: Secondary | ICD-10-CM

## 2018-12-08 DIAGNOSIS — N183 Chronic kidney disease, stage 3 unspecified: Secondary | ICD-10-CM | POA: Diagnosis not present

## 2018-12-08 DIAGNOSIS — E1142 Type 2 diabetes mellitus with diabetic polyneuropathy: Secondary | ICD-10-CM | POA: Diagnosis not present

## 2018-12-08 DIAGNOSIS — F329 Major depressive disorder, single episode, unspecified: Secondary | ICD-10-CM

## 2018-12-08 DIAGNOSIS — Z79890 Hormone replacement therapy (postmenopausal): Secondary | ICD-10-CM

## 2018-12-08 DIAGNOSIS — Z79899 Other long term (current) drug therapy: Secondary | ICD-10-CM

## 2018-12-08 DIAGNOSIS — Z794 Long term (current) use of insulin: Secondary | ICD-10-CM | POA: Diagnosis not present

## 2018-12-08 DIAGNOSIS — E039 Hypothyroidism, unspecified: Secondary | ICD-10-CM

## 2018-12-08 DIAGNOSIS — Z0000 Encounter for general adult medical examination without abnormal findings: Secondary | ICD-10-CM

## 2018-12-08 DIAGNOSIS — I129 Hypertensive chronic kidney disease with stage 1 through stage 4 chronic kidney disease, or unspecified chronic kidney disease: Secondary | ICD-10-CM

## 2018-12-08 DIAGNOSIS — F339 Major depressive disorder, recurrent, unspecified: Secondary | ICD-10-CM

## 2018-12-08 DIAGNOSIS — F32A Depression, unspecified: Secondary | ICD-10-CM

## 2018-12-08 LAB — POCT GLYCOSYLATED HEMOGLOBIN (HGB A1C): Hemoglobin A1C: 9 % — AB (ref 4.0–5.6)

## 2018-12-08 LAB — GLUCOSE, CAPILLARY: Glucose-Capillary: 223 mg/dL — ABNORMAL HIGH (ref 70–99)

## 2018-12-08 MED ORDER — OZEMPIC (0.25 OR 0.5 MG/DOSE) 2 MG/1.5ML ~~LOC~~ SOPN: 0.5000 mg | PEN_INJECTOR | SUBCUTANEOUS | 5 refills | Status: DC

## 2018-12-08 NOTE — Assessment & Plan Note (Addendum)
-  This problem is chronic and stable -Patient states that she is compliant with her Synthroid -Her last TSH was elevated in the 20s when she was noncompliant with her medications -We will recheck her TSH today -No further work-up at this time  Addendum: -I called the patient discussed the results of her TSH with her.  TSH remains in the 20s. -Patient initially stated that she is taking her Synthroid every day.  However, on further questioning states that she is not sure that she has this medication with her. -I did tell her that [facility]  She states that she will call her pharmacy to see if that medication is there or not.  If it is not at the pharmacy patient will call us and let us know and I will send this again. -I also instructed the patient to take this tablet in the morning on an empty stomach 30 minutes before food.  Patient states that she has not been taking it that way.  I explained the importance of this and patient states that she will take it on an empty stomach 30 minutes before food. -We will recheck her TSH on her follow-up visit

## 2018-12-08 NOTE — Patient Instructions (Addendum)
-  It was a pleasure seeing you today -Your A1c is elevated to 9.  Please continue with your current diabetic medications.  Please follow-up with me in 1 month with your meter and your blood glucose readings -We will check a thyroid level today -Please make an appointment to follow-up for your mammogram -Please make an appointment to follow-up with Miquel Dunn -Please call me if you need any medication refills or if you have any concerns -Please follow-up with me in 1 month

## 2018-12-08 NOTE — Progress Notes (Signed)
   Subjective:    Patient ID: [name], female    DOB: [birth year], 68 y.o.   MRN: [id number]  HPI  I have seen and examined this patient.  Patient is here for routine follow-up of her diabetes and hypertension.  Patient states that she has had intermittent episodes of worsening depression and that her mood is occasionally low.  Her daughter passed away last year and the holidays do cause her to have intermittent worsening depression.  Patient states that she is compliant with her medications and denies any other complaints at this time.  Patient states that she has not been checking her blood sugars because her Dexcom meter stopped working.  Review of Systems  Constitutional: Negative.   HENT: Negative.   Respiratory: Negative.   Cardiovascular: Negative.   Gastrointestinal: Negative.   Musculoskeletal: Negative.   Neurological: Negative.   Psychiatric/Behavioral:       Complains of worsening depression       Objective:   Physical Exam Constitutional:      Appearance: Normal appearance.  HENT:     Head: Normocephalic and atraumatic.  Neck:     Musculoskeletal: Neck supple.  Cardiovascular:     Rate and Rhythm: Normal rate and regular rhythm.     Heart sounds: Normal heart sounds.  Pulmonary:     Breath sounds: Normal breath sounds. No wheezing or rales.  Abdominal:     General: Bowel sounds are normal. There is no distension.     Palpations: Abdomen is soft.     Tenderness: There is no abdominal tenderness.  Musculoskeletal:        General: No swelling or tenderness.  Lymphadenopathy:     Cervical: No cervical adenopathy.  Skin:    General: Skin is warm and dry.  Neurological:     General: No focal deficit present.     Mental Status: She is alert and oriented to person, place, and time.  Psychiatric:        Behavior: Behavior normal.     Comments: Appears depressed           Assessment & Plan:  Please see problem based charting for assessment and plan:

## 2018-12-08 NOTE — Assessment & Plan Note (Signed)
-  This problem is chronic and stable -Patient's PHQ 9 score is worsened to 10 from 7 previously -Patient states that she has been having intermittent episodes of worsening depression and low moods and felt like there were days when her depression medications were not working.  Patient's daughter passed away last year and states that it is difficult around the holidays -We will continue with Effexor 150 mg as well as BuSpar 5 mg twice daily -Patient encouraged to follow-up with Miquel Dunn (our behavioral health clinician) -No further work-up at this time

## 2018-12-08 NOTE — Assessment & Plan Note (Addendum)
-  This problem is chronic and stable -Patient's A1c has worsened to 9 today -Patient has been unable to check her blood sugars secondary to her new meter not working -She is to meet with [name] today to follow-up for this -We will not change her diabetic regimen today as I am uncertain of her blood sugars -We will have patient follow-up with me in 4 weeks with her meter and adjust her diabetic meds accordingly -Patient is currently on Ozempic 0.5 mg weekly, Metformin 1000 mg twice daily as well as Humalog 10 units 3 times daily with meals and Tresiba 60 units at bedtime -Patient asked to follow a diabetic diet -No further work-up at this time

## 2018-12-08 NOTE — Patient Instructions (Addendum)
when you get home-  1- Enter the transmitter serial number(SN) int the reader 2- put sensor on your abdomen 3- Insert the transmitter into the sensorr after wiping with alcohol 4- On reader- start sensor   We can follow up on 11/20 to put on your next sensor on.

## 2018-12-08 NOTE — Assessment & Plan Note (Signed)
-  Patient missed her mammography appointment yesterday.  She was asked to reschedule this again -No further work-up at this time

## 2018-12-08 NOTE — Assessment & Plan Note (Signed)
-  This problem is chronic and stable -Patient last BMP showed a creatinine 1.08 in September -We will check this again at her follow-up visit -No further work-up at this time

## 2018-12-08 NOTE — Assessment & Plan Note (Signed)
-  This problem is chronic and stable -Patient's SBP is mildly elevated today with systolics in the [id number] -Patient currently asymptomatic and has been off of her blood pressure medications.  She was experiencing orthostatic hypotension symptoms on previous antihypertensives.  We will continue to monitor her off these medications for now. -If her blood pressure remains persistently elevated may consider adding a low-dose antihypertensive such as lisinopril 2.5 mg or amlodipine 2.5 mg -We will recheck on follow-up visit

## 2018-12-09 ENCOUNTER — Ambulatory Visit
Admission: RE | Admit: 2018-12-09 | Discharge: 2018-12-09 | Disposition: A | Discharge status: Home / Self Care | Payer: Medicare Other | Source: Ambulatory Visit | Attending: Internal Medicine | Admitting: Internal Medicine

## 2018-12-09 ENCOUNTER — Telehealth: Payer: Self-pay | Admitting: *Deleted

## 2018-12-09 DIAGNOSIS — Z1231 Encounter for screening mammogram for malignant neoplasm of breast: Secondary | ICD-10-CM

## 2018-12-09 LAB — TSH: TSH: 27.4 u[IU]/mL — ABNORMAL HIGH (ref 0.450–4.500)

## 2018-12-09 NOTE — Telephone Encounter (Signed)
Attempts to call patient at number listed. When call get message that it is a [place] office.  Call to [facility].  Gave the same number as contact.  Hassan Rowan stated that she would contact patient herself today and ask patient to call the Clinics as soon as possible and have her call the Clinics and to ask for Dr. Dareen Piano.  Sander Nephew, RN  12/09/2018 10:45 AM.

## 2018-12-09 NOTE — Telephone Encounter (Signed)
Spoke w/ pt, she states she was called by dr Dareen Piano this am about her thyroid medicine, stated she hadnt taken it and the pharmacy didn't have a script. She was mispronouncing the name, called the pharm, the pharmacist thought she was saying meloxicam. Told her no, levothyroxine, they have the script, pt has been getting the med and is scheduled for new delivery this week, last delivered 7/29 90 days worth so she has been without for appr 4 to 5 days

## 2018-12-09 NOTE — Telephone Encounter (Signed)
Thank you. Can you please call Ms. Wolven and let her know?

## 2018-12-09 NOTE — Telephone Encounter (Signed)
I have when I was checking

## 2018-12-10 ENCOUNTER — Encounter: Payer: Self-pay | Admitting: Dietician

## 2018-12-10 NOTE — Progress Notes (Signed)
Dexcom G6 Personal CGM Training  Start time:1100  End time: 1120 Total time: 20 [name] was educated about the following:  Assisted [name] in entering her sensor number into her reader. She forgot to bring her transmitter, so we were unable to complete the set up and start of her Dexcom CGM.   -Setting alert profile- she did not want to set any additional alarms She was educated about how to apply sensor-  When she got home after entering the transmitter serial number. I have tried calling her several times to follow up but cannot get through on the phone listed for her.  -Calibrating- none should be required for G6   Now that she entered the sensor serial number.   Follow up as needed.  Patient has [facility] tech support and my contact information.  Debera Lat, RD 12/10/2018 4:00 PM.

## 2018-12-18 ENCOUNTER — Telehealth: Payer: Self-pay | Admitting: Dietician

## 2018-12-18 ENCOUNTER — Ambulatory Visit: Payer: Medicare Other | Admitting: Dietician

## 2018-12-18 NOTE — Telephone Encounter (Signed)
Called [name] about her appointment today to assist her with changing her Continuous glucose monitor sensor if needed.

## 2018-12-28 ENCOUNTER — Other Ambulatory Visit: Payer: Self-pay | Admitting: Internal Medicine

## 2018-12-28 DIAGNOSIS — E1142 Type 2 diabetes mellitus with diabetic polyneuropathy: Secondary | ICD-10-CM

## 2018-12-29 ENCOUNTER — Ambulatory Visit: Payer: Medicare Other | Admitting: Podiatry

## 2019-01-05 ENCOUNTER — Ambulatory Visit: Payer: Medicare Other | Admitting: Dietician

## 2019-01-05 ENCOUNTER — Ambulatory Visit: Payer: Medicare Other | Admitting: Internal Medicine

## 2019-01-06 ENCOUNTER — Ambulatory Visit: Payer: Medicare Other

## 2019-01-06 ENCOUNTER — Encounter: Payer: Medicare Other | Admitting: Dietician

## 2019-01-11 ENCOUNTER — Ambulatory Visit: Payer: Medicare Other

## 2019-01-12 ENCOUNTER — Ambulatory Visit (INDEPENDENT_AMBULATORY_CARE_PROVIDER_SITE_OTHER): Payer: Medicare Other | Admitting: Dietician

## 2019-01-12 ENCOUNTER — Encounter: Payer: Self-pay | Admitting: Dietician

## 2019-01-12 ENCOUNTER — Other Ambulatory Visit: Payer: Self-pay

## 2019-01-12 ENCOUNTER — Ambulatory Visit (INDEPENDENT_AMBULATORY_CARE_PROVIDER_SITE_OTHER): Payer: Medicare Other | Admitting: Internal Medicine

## 2019-01-12 VITALS — BP 140/68 | HR 91 | Temp 98.4°F | Wt 127.3 lb

## 2019-01-12 DIAGNOSIS — R4189 Other symptoms and signs involving cognitive functions and awareness: Secondary | ICD-10-CM

## 2019-01-12 DIAGNOSIS — Z713 Dietary counseling and surveillance: Secondary | ICD-10-CM

## 2019-01-12 DIAGNOSIS — N183 Chronic kidney disease, stage 3 unspecified: Secondary | ICD-10-CM

## 2019-01-12 DIAGNOSIS — Z9181 History of falling: Secondary | ICD-10-CM

## 2019-01-12 DIAGNOSIS — F339 Major depressive disorder, recurrent, unspecified: Secondary | ICD-10-CM

## 2019-01-12 DIAGNOSIS — E039 Hypothyroidism, unspecified: Secondary | ICD-10-CM | POA: Diagnosis not present

## 2019-01-12 DIAGNOSIS — I10 Essential (primary) hypertension: Secondary | ICD-10-CM | POA: Diagnosis not present

## 2019-01-12 DIAGNOSIS — Z794 Long term (current) use of insulin: Secondary | ICD-10-CM

## 2019-01-12 DIAGNOSIS — I129 Hypertensive chronic kidney disease with stage 1 through stage 4 chronic kidney disease, or unspecified chronic kidney disease: Secondary | ICD-10-CM

## 2019-01-12 DIAGNOSIS — R441 Visual hallucinations: Secondary | ICD-10-CM | POA: Insufficient documentation

## 2019-01-12 DIAGNOSIS — G629 Polyneuropathy, unspecified: Secondary | ICD-10-CM | POA: Diagnosis not present

## 2019-01-12 DIAGNOSIS — Z79890 Hormone replacement therapy (postmenopausal): Secondary | ICD-10-CM

## 2019-01-12 DIAGNOSIS — E1122 Type 2 diabetes mellitus with diabetic chronic kidney disease: Secondary | ICD-10-CM

## 2019-01-12 DIAGNOSIS — Z79899 Other long term (current) drug therapy: Secondary | ICD-10-CM

## 2019-01-12 DIAGNOSIS — E1142 Type 2 diabetes mellitus with diabetic polyneuropathy: Secondary | ICD-10-CM | POA: Diagnosis not present

## 2019-01-12 DIAGNOSIS — G4700 Insomnia, unspecified: Secondary | ICD-10-CM

## 2019-01-12 DIAGNOSIS — F32A Depression, unspecified: Secondary | ICD-10-CM

## 2019-01-12 DIAGNOSIS — F329 Major depressive disorder, single episode, unspecified: Secondary | ICD-10-CM

## 2019-01-12 HISTORY — DX: Visual hallucinations: R44.1

## 2019-01-12 NOTE — Patient Instructions (Addendum)
[name],   I attached a copy of your medications to this instructions. Please make sure this list is accurate with the medications you have at home. If not, call us and let us know.   Your thyroid medication (levothyroxine/Synthroid) has to be taken every day 30 minutes before breakfast.  If you take it after meals it will not do its job.  This is important because your thyroid function is uncontrolled which is why you are feeling so tired and with low energy.  I will place a referral from a home health aide and nurse who can come help with your medications and house chores.  Expect a call from them within a week.  If you have any questions at all these do not hesitate to give [place] a call and let us know.  - Dr. Frederico Hamman

## 2019-01-12 NOTE — Progress Notes (Signed)
Diabetes Self-Management Education  Visit Type: Follow-up  Appt. Start Time: 1530 Appt. End Time: 1400  01/12/2019  Christy. Angus Gardner, identified by name and date of birth, is a 68 y.o. female with a diagnosis of Diabetes:  Type 2   ASSESSMENT  Assisted Christy Cervin in placing a sample freestyle libre 2 CGM today.  We agreed she would use this for a week and follow up by phone to see if she thinks this is easier for her to use than the Dexcom. CGM. She states her daughter is living with her now and she may be able to help her with placing it at home.   Lab Results  Component Value Date   HGBA1C 9.0 (A) 12/08/2018   HGBA1C 7.7 (A) 08/25/2018   HGBA1C 8.5 (A) 06/11/2018   HGBA1C 7.8 (A) 03/03/2018   HGBA1C 9.3 (A) 01/08/2018      Diabetes Self-Management Education - 01/12/19 1600      Visit Information   Visit Type  Follow-up      Health Coping   How would you rate your overall health?  Good      Pre-Education Assessment   Patient understands monitoring blood glucose, interpreting and using results  Needs Instruction   assisted her with learning how to use Freestyle Libre CGM     Patient Education   Monitoring  Other (comment)   sample provided;educated on how to use Freestyle Libre 2      Individualized Goals (developed by patient)   Monitoring   test blood glucose pre and post meals as discussed      Patient Self-Evaluation of Goals - Patient rates self as meeting previously set goals (% of time)   Monitoring  < 25%      Outcomes   Expected Outcomes  Demonstrated interest in learning. Expect positive outcomes    Future DMSE  2 wks    Program Status  Completed      Subsequent Visit   Since your last visit have you continued or begun to take your medications as prescribed?  Yes    Since your last visit have you experienced any weight changes?  Loss    Weight Loss (lbs)  3    Since your last visit, are you checking your blood glucose at least once a day?  No   lost  transmitter from [facility], says it is too hard for her      Individualized Plan for Diabetes Self-Management Training:   Learning Objective:  Patient will have a greater understanding of diabetes self-management. Patient education plan is to attend individual and/or group sessions per assessed needs and concerns.   Plan:   There are no Patient Instructions on file for this visit.  Expected Outcomes:  Demonstrated interest in learning. Expect positive outcomes  Education material provided: Diabetes Resources  If problems or questions, patient to contact team via:  Phone  Future DSME appointment: 2 wks  Debera Lat, RD 01/12/2019 4:37 PM.

## 2019-01-12 NOTE — Progress Notes (Signed)
   CC: Hypothyroidism, DM and HTN follow up, visual hallucinations   HPI:  [name] is a 68 y.o. year-old female with PMH listed below who presents to clinic for hypothyroidism, DM and HTN follow up, visual hallucinations . Please see problem based assessment and plan for further details.   Past Medical History:  Diagnosis Date  . Arthritis   . Bilateral hip pain 02/24/2015  . Borderline glaucoma of both eyes   . Depression   . Diabetic polyneuropathy ([place])   . Diverticulosis of colon   . Dizziness 12/05/2017  . Dry eyes, bilateral   . Feeling of incomplete bladder emptying   . Frequent falls 01/08/2018  . History of breast cancer oncologist-  dr Waymon Budge-- per lov note no recurrence   dx 04/ 1999 --- Stage 3B-- s/p  chemotherapy then right mastectomy then concurrent chemoradiation therapy  . History of urinary retention    01/ 2018  . Hydronephrosis of right kidney   . Hyperlipidemia   . Hypertension   . Hypokalemia 12/09/2016  . Hypomagnesemia 12/09/2016  . Hypothyroidism   . Insulin dependent type 2 diabetes mellitus [facility])    endocrinologist-  dr Cruzita Lederer---  last A1c 8.7 on 02-20-2016  . Lymphedema of upper extremity    right  . Macular degeneration, left eye    followed by dr Zigmund Daniel  . Renal insufficiency   . Urgency of urination    Review of Systems:   Review of Systems  Constitutional: Positive for malaise/fatigue. Negative for chills, fever and weight loss.  Cardiovascular: Negative for chest pain, palpitations and leg swelling.  Gastrointestinal: Negative for abdominal pain and constipation.  Genitourinary: Negative for dysuria, frequency and urgency.  Neurological: Negative for dizziness and headaches.  Psychiatric/Behavioral: Positive for depression, hallucinations and memory loss. Negative for suicidal ideas. The patient has insomnia.     Physical Exam:  Vitals:   01/12/19 1347  BP: (!) 158/86  Pulse: 91  Temp: 98.4 F (36.9 C)  TempSrc:  Oral  SpO2: 100%  Weight: 127 lb 4.8 oz (57.7 kg)    General:chronically-ill appearing in no acute distress Cardiac: regular rate and rhythm, nl S1/S2, no murmurs, rubs or gallops Pulm: CTAB, no wheezes or crackles, no increased work of breathing on room air  Abd: soft, NTND, bowel sounds present  Neuro: A&Ox3, CN II-XII intact, sensation intact in all four extremities, no motor deficits Psych: Patient describes mood as "fine". Affect is flat. Appears attentive and has appropriate behavior. Speech is slow. Concentration is appropriate. Reports daily VH. Denies AH, SI, HI. Judgement and insight are good.   (Please see media tab for copy of Mini-cog and MoCA)      Assessment & Plan:   See Encounters Tab for problem based charting.  Patient discussed with Dr. Philipp Ovens

## 2019-01-13 ENCOUNTER — Encounter: Payer: Self-pay | Admitting: Internal Medicine

## 2019-01-13 ENCOUNTER — Ambulatory Visit: Payer: Medicare Other

## 2019-01-13 ENCOUNTER — Telehealth: Payer: Self-pay | Admitting: *Deleted

## 2019-01-13 DIAGNOSIS — R4189 Other symptoms and signs involving cognitive functions and awareness: Secondary | ICD-10-CM | POA: Insufficient documentation

## 2019-01-13 LAB — MICROSCOPIC EXAMINATION: Casts: NONE SEEN /lpf

## 2019-01-13 LAB — BMP8+ANION GAP
Anion Gap: 14 mmol/L (ref 10.0–18.0)
BUN/Creatinine Ratio: 10 — ABNORMAL LOW (ref 12–28)
BUN: 11 mg/dL (ref 8–27)
CO2: 25 mmol/L (ref 20–29)
Calcium: 9.7 mg/dL (ref 8.7–10.3)
Chloride: 97 mmol/L (ref 96–106)
Creatinine, Ser: 1.15 mg/dL — ABNORMAL HIGH (ref 0.57–1.00)
GFR calc Af Amer: 56 mL/min/{1.73_m2} — ABNORMAL LOW (ref >59)
GFR calc non Af Amer: 49 mL/min/{1.73_m2} — ABNORMAL LOW (ref >59)
Glucose: 385 mg/dL — ABNORMAL HIGH (ref 65–99)
Potassium: 4.5 mmol/L (ref 3.5–5.2)
Sodium: 136 mmol/L (ref 134–144)

## 2019-01-13 LAB — URINALYSIS, ROUTINE W REFLEX MICROSCOPIC
Bilirubin, UA: NEGATIVE
Ketones, UA: NEGATIVE
Leukocytes,UA: NEGATIVE
Nitrite, UA: NEGATIVE
RBC, UA: NEGATIVE
Specific Gravity, UA: 1.026 (ref 1.005–1.030)
Urobilinogen, Ur: 0.2 mg/dL (ref 0.2–1.0)
pH, UA: 5.5 (ref 5.0–7.5)

## 2019-01-13 LAB — TSH: TSH: 15.2 u[IU]/mL — ABNORMAL HIGH (ref 0.450–4.500)

## 2019-01-13 NOTE — Assessment & Plan Note (Signed)
Patient was seen by PCP 6 weeks ago and found to have a TSH of 27. A that time, she was not taking her Synthroid appropriately as documented by PCP on last note and was educated on how to do so. She presents today for follow up. She is not sure if she has been taking the medicine but states that when she does, sometimes is before breakfast and sometimes is afterwards. She may be taking it with other medications as well. I educated her on how to take the medication appropriately. Will check TSH today. No dose adjustment for now. I spoke to her granddaughter about this. She will talk to her mom, who lives with patient, to provide assistance and monitoring when patient takes medications. I also placed a referral for [facility] RN to assist with medication management.

## 2019-01-13 NOTE — Telephone Encounter (Signed)
PCS form given to provider who saw patient 01/12/2019 for completion. Hubbard Hartshorn, BSN, RN-BC

## 2019-01-13 NOTE — Progress Notes (Signed)
Internal Medicine Clinic Attending  Case discussed with Dr. Santos-Sanchez at the time of the visit.  We reviewed the resident's history and exam and pertinent patient test results.  I agree with the assessment, diagnosis, and plan of care documented in the resident's note.    

## 2019-01-13 NOTE — Assessment & Plan Note (Signed)
Patient has a history of depression that is treated with venlafaxine. Prescription was refilled in 12/07/2018, but she is unable to tell me if she is taking it. PHQ-9 today = 9. Her  granddaughter who was present in the room during part of the visit tells me that ever since one of the patient's daughters died in 08/09/17 from brain cancer she has been very depressed and has "given up". She reports patient has not been taking great care of herself since then and has been declining. She has some support at home, but not much. On exam, patient does have a flat affect and slow speech though not sure if this is her baseline. She also reports visual hallucinations (see separate A&P). No SI/HI. Per chart, she follows up with Miquel Dunn, but I do not see any recent encounter in the past 6 months. Will message Miquel Dunn to schedule patient for appt.
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
## 2019-01-13 NOTE — Assessment & Plan Note (Addendum)
Patient has a history of T2DM with recent increase in A1c to 9%. Her new meter was not working at that time and she was advised to follow up with [name] and come back in 4 weeks to review meter. Unfortunately, she forgot to bring her meter today. Will therefore not make any adjustments to her regimen today.  CGM placed today.
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
## 2019-01-13 NOTE — Assessment & Plan Note (Signed)
Patient has a history of HTN but not on anti-hypertensive medications due to episodes of hypotension. BP 158/68 and 140/68 when repeated. PCP recommended adding a small dose of lisinopril or amlodipine if persistent HTN, but pt is close to goal BP. Will continue to monitor.

## 2019-01-13 NOTE — Assessment & Plan Note (Signed)
Patient has a history of CKD 3. Reviewed medication list, no nephrotoxic agents. BMP ordered per PCP last note. Cr 1.15.

## 2019-01-13 NOTE — Telephone Encounter (Signed)
Completed and signed Request for Independent Assessment for [place] of Medical Need faxed to [place] at [phone].  Laurenze L Ducatte12/16/20203:11 PM

## 2019-01-14 LAB — VITAMIN B12: Vitamin B-12: 541 pg/mL (ref 232–1245)

## 2019-01-14 LAB — SPECIMEN STATUS REPORT

## 2019-01-14 NOTE — Telephone Encounter (Signed)
Received referral for [facility] RN/Aide. Patient formerly with [facility] in June 2020. Spoke with Sharmon Revere with [facility]. Unfortunately, they are only taking Medicare A and B payor right now and patient has [employer] [facility]. Hubbard Hartshorn, BSN, RN-BC

## 2019-01-15 LAB — TOXASSURE SELECT,+ANTIDEPR,UR

## 2019-01-18 NOTE — Telephone Encounter (Signed)
Received: Today Message Contents  Charissa Bash, [place], Jaynell Castagnola C, [place]  Good morning. Thank you for the referral but at present I just do not have the staffing to accept it.   -Meredeth Ide [facility]       Previous Messages

## 2019-01-18 NOTE — Telephone Encounter (Signed)
RE: Coffee and Aid Received: Today Message Contents  [name]  [name], [name], [place]  we can not accept sorry

## 2019-01-19 NOTE — Telephone Encounter (Signed)
I spoke with [facility] @[phone] and they can not take patient due to staffing [place], [place] C12/22/202012:18 PM

## 2019-02-18 ENCOUNTER — Telehealth: Payer: Self-pay | Admitting: Dietician

## 2019-02-18 NOTE — Telephone Encounter (Signed)
Called to follow up on the sample of the Freestyle libre 2 CGM.  Ms Schupbach says she prefers the Dexcom. She needs more supplies and does not know who the company is that was supplying her. She reports that she is not checking her blood sugar regularly with her meter. I encouraged her to do so.   I called [employer] and reordered her CGM supplies and asked the to put her on their reminder calls. Patient informed and asked to call to make an appointment when she gets her CGM supplies.   Pain in left side pins and needles daily. Happens 3-4 x a day and late at night. She asked that triage nurse call her tomorrow. I suggested she Drink water, check blood sugars, says her taste is off also so that water doesn't taste good to her but she'd try.

## 2019-03-02 ENCOUNTER — Ambulatory Visit: Payer: Medicare Other | Admitting: Podiatry

## 2019-03-02 ENCOUNTER — Telehealth: Payer: Self-pay | Admitting: Dietician

## 2019-03-02 DIAGNOSIS — Z794 Long term (current) use of insulin: Secondary | ICD-10-CM | POA: Diagnosis not present

## 2019-03-02 DIAGNOSIS — E118 Type 2 diabetes mellitus with unspecified complications: Secondary | ICD-10-CM | POA: Diagnosis not present

## 2019-03-02 NOTE — Telephone Encounter (Signed)
[name] is a 69 y.o. female who was contacted on behalf of [facility] Geriatrics Task Force. Left voicemail for return call  DM meds and BS checks   "How often do you check your blood sugars at home?" has not been checking due to not feeling well, not eating much, has not felt low or high. She did get her CGM sensors and transmitter but did not know they arrived so has not put it on yet. She says she has not felt well because of the pain in her side that radiates is still bothering her and maybe getting worse (the pain she re[poted on 03/02/19).   I told her that I would have the triage nurse to call her and asked her to check her blood sugar in the mean time.

## 2019-03-10 ENCOUNTER — Telehealth: Payer: Self-pay | Admitting: *Deleted

## 2019-03-10 NOTE — Telephone Encounter (Signed)
Information sent through CoverMyMeds for [place] for [employer].  .  Awaiting determination within 72 hours.  Sander Nephew, RN 03/10/2019 11:25 AM.

## 2019-03-12 ENCOUNTER — Telehealth: Payer: Self-pay | Admitting: *Deleted

## 2019-03-12 NOTE — Telephone Encounter (Signed)
Thank you :)

## 2019-03-12 NOTE — Telephone Encounter (Signed)
[name]. Ask triage to speak w/ pt and schedule appt Pt states she has not checked her CBG in over 1 week, she has misplaced her meter and needs a new one She is having pain and cramps in leg Cannot really tell nurse if she has been taking medicine. [facility] appt 2/15 at 1015

## 2019-03-12 NOTE — Telephone Encounter (Signed)
I agree

## 2019-03-15 ENCOUNTER — Ambulatory Visit: Payer: Medicare Other | Admitting: Dietician

## 2019-03-15 ENCOUNTER — Encounter: Payer: Self-pay | Admitting: Internal Medicine

## 2019-03-15 ENCOUNTER — Ambulatory Visit (INDEPENDENT_AMBULATORY_CARE_PROVIDER_SITE_OTHER): Payer: Medicare Other | Admitting: Internal Medicine

## 2019-03-15 ENCOUNTER — Other Ambulatory Visit: Payer: Self-pay

## 2019-03-15 ENCOUNTER — Encounter: Payer: Self-pay | Admitting: Dietician

## 2019-03-15 VITALS — BP 156/74 | HR 98 | Temp 100.0°F | Ht 62.0 in | Wt 127.4 lb

## 2019-03-15 DIAGNOSIS — D649 Anemia, unspecified: Secondary | ICD-10-CM | POA: Diagnosis not present

## 2019-03-15 DIAGNOSIS — M79605 Pain in left leg: Secondary | ICD-10-CM | POA: Diagnosis not present

## 2019-03-15 DIAGNOSIS — E039 Hypothyroidism, unspecified: Secondary | ICD-10-CM

## 2019-03-15 DIAGNOSIS — I10 Essential (primary) hypertension: Secondary | ICD-10-CM

## 2019-03-15 DIAGNOSIS — E118 Type 2 diabetes mellitus with unspecified complications: Secondary | ICD-10-CM

## 2019-03-15 DIAGNOSIS — G8929 Other chronic pain: Secondary | ICD-10-CM

## 2019-03-15 DIAGNOSIS — R4189 Other symptoms and signs involving cognitive functions and awareness: Secondary | ICD-10-CM | POA: Diagnosis not present

## 2019-03-15 DIAGNOSIS — E785 Hyperlipidemia, unspecified: Secondary | ICD-10-CM | POA: Diagnosis not present

## 2019-03-15 DIAGNOSIS — Z79890 Hormone replacement therapy (postmenopausal): Secondary | ICD-10-CM

## 2019-03-15 DIAGNOSIS — N898 Other specified noninflammatory disorders of vagina: Secondary | ICD-10-CM

## 2019-03-15 DIAGNOSIS — Z79899 Other long term (current) drug therapy: Secondary | ICD-10-CM

## 2019-03-15 HISTORY — DX: Pain in left leg: M79.605

## 2019-03-15 MED ORDER — ACETAMINOPHEN 325 MG PO CAPS: 325.0000 mg | ORAL_CAPSULE | Freq: Four times a day (QID) | ORAL | 1 refills | Status: DC | PRN

## 2019-03-15 NOTE — Assessment & Plan Note (Signed)
Patient brought in her bottle rosuvastatin.  States she is not taking it any longer because it caused right arm swelling consistently when taking it.  Recommend further follow-up at future visits.

## 2019-03-15 NOTE — Assessment & Plan Note (Signed)
[name] presents today with her medications with her.  She has 2 bottles of levothyroxine.One of the bottles is for 75 mcg, dating back from 2019 and is currently expired.  The other bottle is 400 mcg and is dating back to July 2020.  [name] states that she is taking her levothyroxine every morning and maybe misses doses to 3 times per week.  However the bottle dating back to July is full.  This is concerning for medication noncompliance secondary to difficulty with IADLs.  In addition, today she is complaining of leg cramping which may be secondary to her uncontrolled hyperthyroidism.  We will plan to recheck her TSH.  Plan: -TSH pending -Emphasized the importance of medication compliance -Continue levothyroxine 100 mcg/day
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
## 2019-03-15 NOTE — Progress Notes (Signed)
CC: leg pain  HPI:  [name] is a 69 y.o. with a PMHx of uncontrolled T2DM, HTN who presents to the clinic for leg pain.   Please see the Encounters tab for problem-based Assessment & Plan regarding status of patient's chronic conditions.  Past Medical History:  Diagnosis Date  . Arthritis   . Bilateral hip pain 02/24/2015  . Borderline glaucoma of both eyes   . Depression   . Diabetic polyneuropathy ([place])   . Diverticulosis of colon   . Dizziness 12/05/2017  . Dry eyes, bilateral   . Feeling of incomplete bladder emptying   . Frequent falls 01/08/2018  . History of breast cancer oncologist-  dr Waymon Budge-- per lov note no recurrence   dx 04/ 1999 --- Stage 3B-- s/p  chemotherapy then right mastectomy then concurrent chemoradiation therapy  . History of urinary retention    01/ 2018  . Hydronephrosis of right kidney   . Hyperlipidemia   . Hypertension   . Hypokalemia 12/09/2016  . Hypomagnesemia 12/09/2016  . Hypothyroidism   . Insulin dependent type 2 diabetes mellitus [facility])    endocrinologist-  dr Cruzita Lederer---  last A1c 8.7 on 02-20-2016  . Lymphedema of upper extremity    right  . Macular degeneration, left eye    followed by dr Zigmund Daniel  . Renal insufficiency   . Urgency of urination    Review of Systems: Review of Systems  Constitutional: Negative for chills and fever.  Gastrointestinal: Positive for abdominal pain. Negative for blood in stool, constipation, diarrhea, melena, nausea and vomiting.  Genitourinary: Negative for dysuria, frequency, hematuria and urgency.       Chronic vaginal discharge  Musculoskeletal: Positive for myalgias. Negative for back pain, falls and neck pain.  Neurological: Positive for dizziness. Negative for headaches.   Physical Exam:  Vitals:   03/15/19 1017  BP: (!) 156/74  Pulse: 98  Temp: 100 F (37.8 C)  TempSrc: Oral  SpO2: 100%  Weight: 127 lb 6.4 oz (57.8 kg)  Height: 5\' 2"  (1.575 m)   Physical Exam Vitals  and nursing note reviewed.  Constitutional:      General: She is not in acute distress.    Appearance: She is normal weight.  Cardiovascular:     Rate and Rhythm: Normal rate and regular rhythm.     Heart sounds: No murmur.  Pulmonary:     Effort: Pulmonary effort is normal. No respiratory distress.     Breath sounds: Normal breath sounds. No wheezing, rhonchi or rales.  Abdominal:     General: Bowel sounds are normal. There is no distension.     Palpations: Abdomen is soft.     Tenderness: There is no abdominal tenderness. There is no right CVA tenderness, left CVA tenderness or guarding.  Skin:    General: Skin is warm and dry.  Neurological:     General: No focal deficit present.     Mental Status: She is alert. Mental status is at baseline.     Motor: No weakness.  Psychiatric:        Mood and Affect: Mood and affect normal.        Speech: Speech is delayed.        Behavior: Behavior normal. Behavior is cooperative.        Cognition and Memory: Cognition is impaired. Memory is impaired.        Judgment: Judgment normal.    Assessment & Plan:   See Encounters Tab for problem based  charting.  Patient discussed with Dr. Philipp Ovens

## 2019-03-15 NOTE — Assessment & Plan Note (Signed)
Last CBC was in December 2021 and showed a stable hemoglobin of of 12 with MCV of 96.  B12 was checked back in December 2020 it was within normal limits.  Although patient's anemia is normocytic, will also check iron panel and ferritin today, as may be contributing to patient's leg pain today.  Plan:  - Iron panel, ferritin pending

## 2019-03-15 NOTE — Assessment & Plan Note (Signed)
[name] complains of left lateral thigh pain that extends from her left hip down to her lateral knee, with some extension into her left calf.  The pain is described as sharp, feels like needles poking her.  It occurs intermittently, up to 3-4 times per day.  No exacerbating factors but it is alleviated with Tylenol.  This has been a chronic problem for [name] with previous work-up in 2019 and thought to be secondary to sciatica without back pain.  Today she continues to deny back pain in her straight leg raise is negative bilaterally.  Differential diagnosis includes neuropathy secondary to uncontrolled T2DM, versus uncontrolled hypothyroidism versus anemia.  Plan:  - Iron panel, ferritin, TSH pending -Tylenol 650 mg q6h PRN for pain

## 2019-03-15 NOTE — Patient Instructions (Addendum)
It was nice seeing you today! Thank you for choosing [facility] for your Primary Care.    Today we talked about:   1. Please take Levothyroxine every morning, 30 minutes before eating   2. For leg pain, take 2 capsules of Acetaminophen (Tylenol) every 6 hours as needed. Do not take more than 8 capsules per day  3. I placed a referral to [employer] to help with managing your medications    I checked some blood work today. If results are abnormal and require medication changes, I will contact you with the results. Otherwise, you will review these at your next appointment.    Please follow up with Dr. Dareen Piano as soon as possible

## 2019-03-15 NOTE — Progress Notes (Signed)
Patient brought Dexcom G6 Continuous glucose monitor reader, sensor and transmitter and asked for assistance in putting sensor on and pairing transmitter. She did not bring her meter today.  I advised her to continue to use and bring her meter as well her her transmitter and CGM equipment to visits.  She says she can get help putting her next sensor on in 10 days.   3:11 PM  Call to patient to find out if CGM is working as it was in its 2 hour warm up when she left today. Left voicemail for return call Asked her to report her blood sugar as well.  Debera Lat, RD 03/15/2019 3:13 PM.

## 2019-03-16 ENCOUNTER — Other Ambulatory Visit: Payer: Self-pay | Admitting: *Deleted

## 2019-03-16 LAB — IRON AND TIBC
Iron Saturation: 17 % (ref 15–55)
Iron: 46 ug/dL (ref 27–139)
Total Iron Binding Capacity: 270 ug/dL (ref 250–450)
UIBC: 224 ug/dL (ref 118–369)

## 2019-03-16 LAB — FERRITIN: Ferritin: 50 ng/mL (ref 15–150)

## 2019-03-16 LAB — TSH: TSH: 25 u[IU]/mL — ABNORMAL HIGH (ref 0.450–4.500)

## 2019-03-16 NOTE — Patient Outreach (Signed)
[facility]) Care Management  03/16/2019  Julliana Benish 1950/09/19 [id number]   Referral Date: 2/15 Referral Source: MD office Referral Reason: Assistance with medication compliance, disease management Insurance: [facility]   Outreach attempt #1,  Unsuccessful, HIPAA compliant voice message left.     Plan: RN CM will send unsuccessful outreach letter and follow up within the next 3-4 business days.  Valente David, [place], MSN [place] [phone]

## 2019-03-16 NOTE — Telephone Encounter (Signed)
Pt was seen 2/15

## 2019-03-17 NOTE — Progress Notes (Signed)
Internal Medicine Clinic Attending  Case discussed with Dr. Basaraba at the time of the visit.  We reviewed the resident's history and exam and pertinent patient test results.  I agree with the assessment, diagnosis, and plan of care documented in the resident's note.    

## 2019-03-19 ENCOUNTER — Other Ambulatory Visit: Payer: Self-pay | Admitting: *Deleted

## 2019-03-19 NOTE — Patient Outreach (Signed)
[facility] [facility]) Care Management  03/19/2019  [name] 01-Jul-1950 [id number]   Referral Date: 2/15 Referral Source: MD office Referral Reason: Assistance with medication compliance, disease management Insurance: [facility]   Outreach attempt #2,  Unsuccessful, HIPAA compliant voice message left.     Plan: RN CM will follow up within the next 3-4 business days.  Valente David, [place], MSN [place] [phone]

## 2019-03-25 ENCOUNTER — Other Ambulatory Visit: Payer: Self-pay | Admitting: *Deleted

## 2019-03-25 NOTE — Patient Outreach (Signed)
[facility]) Care Management  03/25/2019  [name] 07-Jan-1951 [id number]   Referral Date:2/15 Referral Source:MD office Referral Reason:Assistance with medication compliance, disease management Insurance:[employer]   Outreach attempt #3, Unsuccessful, unable to leave voice message as mailbox is full.  If no call back by 3/2 will close case due to inability to establish contact.  Valente David, [place], MSN [place] [phone]

## 2019-03-30 ENCOUNTER — Other Ambulatory Visit: Payer: Self-pay | Admitting: *Deleted

## 2019-03-30 DIAGNOSIS — Z794 Long term (current) use of insulin: Secondary | ICD-10-CM | POA: Diagnosis not present

## 2019-03-30 DIAGNOSIS — E118 Type 2 diabetes mellitus with unspecified complications: Secondary | ICD-10-CM | POA: Diagnosis not present

## 2019-03-30 NOTE — Addendum Note (Signed)
Addended by: Hulan Fray on: 03/30/2019 06:57 PM   Modules accepted: Orders

## 2019-03-30 NOTE — Patient Outreach (Signed)
[facility]) Care Management  03/30/2019  Madlin Rens 29-Aug-1950 [id number]   No response from member after multiple unsuccessful outreach attempts and letter sent.  Will close case at this time due to inability to maintain contact.  Will notify member and primary MD of case closure.  Valente David, [place], MSN [place] [phone]

## 2019-04-13 ENCOUNTER — Telehealth: Payer: Self-pay | Admitting: Dietician

## 2019-04-13 ENCOUNTER — Telehealth: Payer: Self-pay

## 2019-04-13 ENCOUNTER — Ambulatory Visit: Payer: Medicare Other | Admitting: Dietician

## 2019-04-13 ENCOUNTER — Encounter: Payer: Medicare Other | Admitting: Internal Medicine

## 2019-04-13 NOTE — Telephone Encounter (Signed)
Tried calling this patient. Their voicemail box is full. I was unable to leave a message

## 2019-04-17 ENCOUNTER — Inpatient Hospital Stay (HOSPITAL_COMMUNITY)
Admission: EM | Admit: 2019-04-17 | Discharge: 2019-04-21 | DRG: 638 | Disposition: A | Discharge status: Skilled Nursing Facility | Payer: Medicare Other | Attending: Student in an Organized Health Care Education/Training Program | Admitting: Student in an Organized Health Care Education/Training Program

## 2019-04-17 ENCOUNTER — Other Ambulatory Visit: Payer: Self-pay

## 2019-04-17 ENCOUNTER — Emergency Department (HOSPITAL_COMMUNITY): Payer: Medicare Other

## 2019-04-17 ENCOUNTER — Encounter (HOSPITAL_COMMUNITY): Payer: Self-pay | Admitting: Emergency Medicine

## 2019-04-17 ENCOUNTER — Inpatient Hospital Stay (HOSPITAL_COMMUNITY): Payer: Medicare Other

## 2019-04-17 DIAGNOSIS — R569 Unspecified convulsions: Secondary | ICD-10-CM | POA: Diagnosis not present

## 2019-04-17 DIAGNOSIS — R739 Hyperglycemia, unspecified: Secondary | ICD-10-CM

## 2019-04-17 DIAGNOSIS — E785 Hyperlipidemia, unspecified: Secondary | ICD-10-CM | POA: Diagnosis present

## 2019-04-17 DIAGNOSIS — Z9221 Personal history of antineoplastic chemotherapy: Secondary | ICD-10-CM | POA: Diagnosis not present

## 2019-04-17 DIAGNOSIS — K5900 Constipation, unspecified: Secondary | ICD-10-CM | POA: Diagnosis present

## 2019-04-17 DIAGNOSIS — H409 Unspecified glaucoma: Secondary | ICD-10-CM | POA: Diagnosis not present

## 2019-04-17 DIAGNOSIS — E1122 Type 2 diabetes mellitus with diabetic chronic kidney disease: Secondary | ICD-10-CM | POA: Diagnosis not present

## 2019-04-17 DIAGNOSIS — Z87448 Personal history of other diseases of urinary system: Secondary | ICD-10-CM

## 2019-04-17 DIAGNOSIS — Z853 Personal history of malignant neoplasm of breast: Secondary | ICD-10-CM | POA: Diagnosis not present

## 2019-04-17 DIAGNOSIS — E1142 Type 2 diabetes mellitus with diabetic polyneuropathy: Secondary | ICD-10-CM | POA: Diagnosis present

## 2019-04-17 DIAGNOSIS — Z79890 Hormone replacement therapy (postmenopausal): Secondary | ICD-10-CM | POA: Diagnosis not present

## 2019-04-17 DIAGNOSIS — Z882 Allergy status to sulfonamides status: Secondary | ICD-10-CM | POA: Diagnosis not present

## 2019-04-17 DIAGNOSIS — Z823 Family history of stroke: Secondary | ICD-10-CM | POA: Diagnosis not present

## 2019-04-17 DIAGNOSIS — Z888 Allergy status to other drugs, medicaments and biological substances status: Secondary | ICD-10-CM

## 2019-04-17 DIAGNOSIS — N179 Acute kidney failure, unspecified: Secondary | ICD-10-CM | POA: Diagnosis present

## 2019-04-17 DIAGNOSIS — E1165 Type 2 diabetes mellitus with hyperglycemia: Principal | ICD-10-CM | POA: Diagnosis present

## 2019-04-17 DIAGNOSIS — Z8249 Family history of ischemic heart disease and other diseases of the circulatory system: Secondary | ICD-10-CM

## 2019-04-17 DIAGNOSIS — Z881 Allergy status to other antibiotic agents status: Secondary | ICD-10-CM | POA: Diagnosis not present

## 2019-04-17 DIAGNOSIS — E039 Hypothyroidism, unspecified: Secondary | ICD-10-CM | POA: Diagnosis present

## 2019-04-17 DIAGNOSIS — Z9181 History of falling: Secondary | ICD-10-CM | POA: Diagnosis not present

## 2019-04-17 DIAGNOSIS — G4089 Other seizures: Secondary | ICD-10-CM | POA: Diagnosis present

## 2019-04-17 DIAGNOSIS — I10 Essential (primary) hypertension: Secondary | ICD-10-CM | POA: Diagnosis not present

## 2019-04-17 DIAGNOSIS — Z880 Allergy status to penicillin: Secondary | ICD-10-CM | POA: Diagnosis not present

## 2019-04-17 DIAGNOSIS — Z20822 Contact with and (suspected) exposure to covid-19: Secondary | ICD-10-CM | POA: Diagnosis not present

## 2019-04-17 DIAGNOSIS — R2681 Unsteadiness on feet: Secondary | ICD-10-CM | POA: Diagnosis not present

## 2019-04-17 DIAGNOSIS — Z79899 Other long term (current) drug therapy: Secondary | ICD-10-CM

## 2019-04-17 DIAGNOSIS — Z833 Family history of diabetes mellitus: Secondary | ICD-10-CM | POA: Diagnosis not present

## 2019-04-17 DIAGNOSIS — M6281 Muscle weakness (generalized): Secondary | ICD-10-CM | POA: Diagnosis not present

## 2019-04-17 DIAGNOSIS — R2689 Other abnormalities of gait and mobility: Secondary | ICD-10-CM | POA: Diagnosis not present

## 2019-04-17 DIAGNOSIS — Z794 Long term (current) use of insulin: Secondary | ICD-10-CM

## 2019-04-17 DIAGNOSIS — H3530 Unspecified macular degeneration: Secondary | ICD-10-CM | POA: Diagnosis present

## 2019-04-17 DIAGNOSIS — I959 Hypotension, unspecified: Secondary | ICD-10-CM | POA: Diagnosis not present

## 2019-04-17 DIAGNOSIS — Z7401 Bed confinement status: Secondary | ICD-10-CM | POA: Diagnosis not present

## 2019-04-17 DIAGNOSIS — R1312 Dysphagia, oropharyngeal phase: Secondary | ICD-10-CM | POA: Diagnosis not present

## 2019-04-17 DIAGNOSIS — M2550 Pain in unspecified joint: Secondary | ICD-10-CM | POA: Diagnosis not present

## 2019-04-17 DIAGNOSIS — I129 Hypertensive chronic kidney disease with stage 1 through stage 4 chronic kidney disease, or unspecified chronic kidney disease: Secondary | ICD-10-CM | POA: Diagnosis not present

## 2019-04-17 DIAGNOSIS — Z743 Need for continuous supervision: Secondary | ICD-10-CM | POA: Diagnosis not present

## 2019-04-17 DIAGNOSIS — Z9011 Acquired absence of right breast and nipple: Secondary | ICD-10-CM | POA: Diagnosis not present

## 2019-04-17 DIAGNOSIS — G9349 Other encephalopathy: Secondary | ICD-10-CM | POA: Diagnosis present

## 2019-04-17 DIAGNOSIS — G40409 Other generalized epilepsy and epileptic syndromes, not intractable, without status epilepticus: Secondary | ICD-10-CM | POA: Diagnosis not present

## 2019-04-17 DIAGNOSIS — R278 Other lack of coordination: Secondary | ICD-10-CM | POA: Diagnosis not present

## 2019-04-17 DIAGNOSIS — G9340 Encephalopathy, unspecified: Secondary | ICD-10-CM | POA: Diagnosis not present

## 2019-04-17 DIAGNOSIS — R000 Tachycardia, unspecified: Secondary | ICD-10-CM | POA: Diagnosis not present

## 2019-04-17 DIAGNOSIS — N189 Chronic kidney disease, unspecified: Secondary | ICD-10-CM | POA: Diagnosis not present

## 2019-04-17 HISTORY — DX: Unspecified convulsions: R56.9

## 2019-04-17 LAB — URINALYSIS, ROUTINE W REFLEX MICROSCOPIC
Bacteria, UA: NONE SEEN
Bilirubin Urine: NEGATIVE
Glucose, UA: >=500 mg/dL — AB
Ketones, ur: NEGATIVE mg/dL
Nitrite: NEGATIVE
Protein, ur: 30 mg/dL — AB
Specific Gravity, Urine: 1.017 (ref 1.005–1.030)
pH: 7 (ref 5.0–8.0)

## 2019-04-17 LAB — POCT I-STAT EG7
Acid-base deficit: 5 mmol/L — ABNORMAL HIGH (ref 0.0–2.0)
Bicarbonate: 20.8 mmol/L (ref 20.0–28.0)
Calcium, Ion: 1.17 mmol/L (ref 1.15–1.40)
HCT: 40 % (ref 36.0–46.0)
Hemoglobin: 13.6 g/dL (ref 12.0–15.0)
O2 Saturation: 74 %
Patient temperature: 99.1
Potassium: 4 mmol/L (ref 3.5–5.1)
Sodium: 131 mmol/L — ABNORMAL LOW (ref 135–145)
TCO2: 22 mmol/L (ref 22–32)
pCO2, Ven: 40.5 mmHg — ABNORMAL LOW (ref 44.0–60.0)
pH, Ven: 7.32 (ref 7.250–7.430)
pO2, Ven: 43 mmHg (ref 32.0–45.0)

## 2019-04-17 LAB — CBC
HCT: 42.7 % (ref 36.0–46.0)
Hemoglobin: 14.4 g/dL (ref 12.0–15.0)
MCH: 31.1 pg (ref 26.0–34.0)
MCHC: 33.7 g/dL (ref 30.0–36.0)
MCV: 92.2 fL (ref 80.0–100.0)
Platelets: 263 10*3/uL (ref 150–400)
RBC: 4.63 MIL/uL (ref 3.87–5.11)
RDW: 12 % (ref 11.5–15.5)
WBC: 7.4 10*3/uL (ref 4.0–10.5)
nRBC: 0 % (ref 0.0–0.2)

## 2019-04-17 LAB — CBG MONITORING, ED
Glucose-Capillary: 520 mg/dL (ref 70–99)
Glucose-Capillary: 586 mg/dL (ref 70–99)
Glucose-Capillary: 396 mg/dL — ABNORMAL HIGH (ref 70–99)

## 2019-04-17 LAB — COMPREHENSIVE METABOLIC PANEL WITH GFR
ALT: 18 U/L (ref 0–44)
AST: 24 U/L (ref 15–41)
Albumin: 4.2 g/dL (ref 3.5–5.0)
Alkaline Phosphatase: 111 U/L (ref 38–126)
Anion gap: 16 — ABNORMAL HIGH (ref 5–15)
BUN: 20 mg/dL (ref 8–23)
CO2: 20 mmol/L — ABNORMAL LOW (ref 22–32)
Calcium: 9.4 mg/dL (ref 8.9–10.3)
Chloride: 93 mmol/L — ABNORMAL LOW (ref 98–111)
Creatinine, Ser: 1.25 mg/dL — ABNORMAL HIGH (ref 0.44–1.00)
GFR calc Af Amer: 51 mL/min — ABNORMAL LOW
GFR calc non Af Amer: 44 mL/min — ABNORMAL LOW
Glucose, Bld: 820 mg/dL (ref 70–99)
Potassium: 5 mmol/L (ref 3.5–5.1)
Sodium: 129 mmol/L — ABNORMAL LOW (ref 135–145)
Total Bilirubin: 0.7 mg/dL (ref 0.3–1.2)
Total Protein: 7.9 g/dL (ref 6.5–8.1)

## 2019-04-17 LAB — I-STAT CHEM 8, ED
BUN: 21 mg/dL (ref 8–23)
Calcium, Ion: 1.17 mmol/L (ref 1.15–1.40)
Chloride: 95 mmol/L — ABNORMAL LOW (ref 98–111)
Creatinine, Ser: 0.9 mg/dL (ref 0.44–1.00)
Glucose, Bld: >700 mg/dL (ref 70–99)
HCT: 39 % (ref 36.0–46.0)
Hemoglobin: 13.3 g/dL (ref 12.0–15.0)
Potassium: 4 mmol/L (ref 3.5–5.1)
Sodium: 131 mmol/L — ABNORMAL LOW (ref 135–145)
TCO2: 23 mmol/L (ref 22–32)

## 2019-04-17 LAB — BETA-HYDROXYBUTYRIC ACID: Beta-Hydroxybutyric Acid: 0.18 mmol/L (ref 0.05–0.27)

## 2019-04-17 MED ORDER — INSULIN ASPART 100 UNIT/ML ~~LOC~~ SOLN
15.0000 [IU] | Freq: Once | SUBCUTANEOUS | Status: AC
  Administered 2019-04-17: 15 [IU] via SUBCUTANEOUS

## 2019-04-17 MED ORDER — LEVETIRACETAM IN NACL 1000 MG/100ML IV SOLN
1000.0000 mg | Freq: Once | INTRAVENOUS | Status: AC
  Administered 2019-04-17: 1000 mg via INTRAVENOUS
  Filled 2019-04-17: qty 100

## 2019-04-17 MED ORDER — LACTATED RINGERS IV BOLUS
1000.0000 mL | Freq: Once | INTRAVENOUS | Status: AC
  Administered 2019-04-17: 1000 mL via INTRAVENOUS

## 2019-04-17 MED ORDER — INSULIN ASPART 100 UNIT/ML ~~LOC~~ SOLN
0.0000 [IU] | SUBCUTANEOUS | Status: DC
  Administered 2019-04-18: 5 [IU] via SUBCUTANEOUS
  Administered 2019-04-18: 7 [IU] via SUBCUTANEOUS
  Administered 2019-04-18: 1 [IU] via SUBCUTANEOUS
  Administered 2019-04-18: 2 [IU] via SUBCUTANEOUS
  Administered 2019-04-18 (×2): 5 [IU] via SUBCUTANEOUS
  Administered 2019-04-18: 3 [IU] via SUBCUTANEOUS
  Administered 2019-04-19: 1 [IU] via SUBCUTANEOUS
  Administered 2019-04-19: 3 [IU] via SUBCUTANEOUS
  Administered 2019-04-19: 5 [IU] via SUBCUTANEOUS
  Administered 2019-04-19: 9 [IU] via SUBCUTANEOUS
  Administered 2019-04-19: 3 [IU] via SUBCUTANEOUS
  Administered 2019-04-20: 2 [IU] via SUBCUTANEOUS
  Administered 2019-04-20: 5 [IU] via SUBCUTANEOUS
  Administered 2019-04-20: 9 [IU] via SUBCUTANEOUS
  Administered 2019-04-20: 5 [IU] via SUBCUTANEOUS
  Administered 2019-04-20 – 2019-04-21 (×2): 7 [IU] via SUBCUTANEOUS
  Administered 2019-04-21: 2 [IU] via SUBCUTANEOUS
  Administered 2019-04-21: 1 [IU] via SUBCUTANEOUS
  Administered 2019-04-21: 5 [IU] via SUBCUTANEOUS

## 2019-04-17 MED ORDER — INSULIN GLARGINE 100 UNIT/ML ~~LOC~~ SOLN
8.0000 [IU] | Freq: Every day | SUBCUTANEOUS | Status: DC
  Administered 2019-04-18 – 2019-04-20 (×3): 8 [IU] via SUBCUTANEOUS
  Filled 2019-04-17 (×5): qty 0.08

## 2019-04-17 MED ORDER — ENOXAPARIN SODIUM 40 MG/0.4ML ~~LOC~~ SOLN
40.0000 mg | SUBCUTANEOUS | Status: DC
  Administered 2019-04-18 – 2019-04-20 (×3): 40 mg via SUBCUTANEOUS
  Filled 2019-04-17 (×3): qty 0.4

## 2019-04-17 MED ORDER — LORAZEPAM 2 MG/ML IJ SOLN
1.0000 mg | Freq: Once | INTRAMUSCULAR | Status: AC
  Administered 2019-04-17: 22:00:00 1 mg via INTRAVENOUS
  Filled 2019-04-17: qty 1

## 2019-04-17 MED ORDER — LEVETIRACETAM 500 MG PO TABS
500.0000 mg | ORAL_TABLET | Freq: Two times a day (BID) | ORAL | Status: DC
  Administered 2019-04-18 – 2019-04-21 (×7): 500 mg via ORAL
  Filled 2019-04-17 (×7): qty 1

## 2019-04-17 MED ORDER — LEVOTHYROXINE SODIUM 100 MCG/5ML IV SOLN
60.0000 ug | Freq: Every day | INTRAVENOUS | Status: DC
  Administered 2019-04-18: 60 ug via INTRAVENOUS
  Filled 2019-04-17 (×2): qty 5

## 2019-04-17 NOTE — ED Notes (Signed)
Pt. Transported to CT 

## 2019-04-17 NOTE — H&P (Addendum)
Date: 04/17/2019               Patient Name:  [name] MRN: [id number]  DOB: 08/23/1950 Age / Sex: 69 y.o., female   PCP: Aldine Contes, MD         Medical Service: Internal Medicine Teaching Service         Attending Physician: Dr. Sherwood Gambler, MD    First Contact: Eileen Stanford, MD, [place] Pager: [phone])  Second Contact: Blenda Nicely Pager: [facility] [phone])       After Hours (After 5p/  First Contact Pager: [phone]  weekends / holidays): Second Contact Pager: [phone]   Chief Complaint: Seizure like activity  History of Present Illness: 69 y.o. yo female w/ PMH significant for seizures, T2DM, hypothyroidism, HTN, HLD, CKD, and breast cancer s/p chemo and R mastectomy who presented to the ED after reportedly having seizure-like activity. The patient states that she has been experiencing right upper extremity shaking since Wednesday. She states the shaking, and cramping episodes of increased in frequency and intensity the last several days. The patient states her appetite has been poor, and she has not been taking any of her medications for the past 6 days.  EMS stated that on arrival, the patient had bilateral upper extremity shaking with gaze deviation but the patient was able to speak to them through the episode. She was given 2.5 mg of Versed.   In the ED, a CBC, CMP, and UA were obtained.  Glucose was notable for hyperglycemia to 500s initially, but subsequently climbed to 700 then 820.  Creatinine was elevated to 1.25 (BL normal). Anion gap was 16 and i-STAT VBG did not show acidosis. Beta hydroxybutyrate was also within normal limits. CT of the head was negative chest x-ray was unremarkable.  Patient also received a liter of LR in the ED.  Neurology consulted on the patient and started her on 1000 mg of Keppra.  Of note, during her history taking, the patient had several episodes of right upper extremity and right lower extremity cramping in addition to right  sided eye deviation. The patient was able to communicate during these episodes and tell us that she was in pain and that her muscles were cramping.  The patient's right foot was in a fixed flexed position, and her calf muscle was rigid.  Right upper extremity was also in a flexed position with a rigid bicep.  She states this is exactly the same with episodes that she was experiencing while at home, and in the ED.  Meds:  -Aspirin 81 mg daily -Buspar 5 mg daily -Calcium citrate-vitamin D 315 - 200 mg 1 tablet twice daily -Vitamin D3 2000 unit tablet daily -Tresiba 16 units nightly -Humalog 8-10 units 3 times daily -Metformin 1000 mg tablet daily -Synthroid 100 mcg tablet daily -Rosuvastatin 20 mg daily -Venlafaxine 150 mg daily  Allergies: Allergies as of 04/17/2019 - Review Complete 04/17/2019  Allergen Reaction Noted  . Gabapentin Other (See Comments) 12/12/2011  . Meclizine hcl Rash 05/01/2018  . Penicillins Rash 04/16/2006  . Sulfa antibiotics Rash 05/01/2016   Past Medical History:  Diagnosis Date  . Arthritis   . Bilateral hip pain 02/24/2015  . Borderline glaucoma of both eyes   . Depression   . Diabetic polyneuropathy ([place])   . Diverticulosis of colon   . Dizziness 12/05/2017  . Dry eyes, bilateral   . Feeling of incomplete bladder emptying   . Frequent falls 01/08/2018  . History  of breast cancer oncologist-  dr Waymon Budge-- per lov note no recurrence   dx 04/ 1999 --- Stage 3B-- s/p  chemotherapy then right mastectomy then concurrent chemoradiation therapy  . History of urinary retention    01/ 2018  . Hydronephrosis of right kidney   . Hyperlipidemia   . Hypertension   . Hypokalemia 12/09/2016  . Hypomagnesemia 12/09/2016  . Hypothyroidism   . Insulin dependent type 2 diabetes mellitus [facility])    endocrinologist-  dr Cruzita Lederer---  last A1c 8.7 on 02-20-2016  . Lymphedema of upper extremity    right  . Macular degeneration, left eye    followed by dr Zigmund Daniel  .  Renal insufficiency   . Urgency of urination    Past Surgical History:  Procedure Laterality Date  . CARPAL TUNNEL RELEASE Right 2017   and tendon repair  . CATARACT EXTRACTION W/ INTRAOCULAR LENS  IMPLANT, BILATERAL  2017  . CYSTO/  RIGHT RETROGRADE PYELOGRAM/  UNROOFING RIGHT URETEROCELE  09/18/2000  . CYSTOSCOPY/RETROGRADE/URETEROSCOPY Right 05/09/2016   Procedure: CYSTOSCOPY and right RETROGRADE;  Surgeon: Irine Seal, MD;  Location: [facility];  Service: Urology;  Laterality: Right;  . FINGER SURGERY Left x2 prior to 09-09-2014  . I & D EXTREMITY Left 09/09/2014   Procedure: IRRIGATION AND DEBRIDEMENT LEFT THUMB DISTAL PHALANX;  Surgeon: Daryll Brod, MD;  Location: [place];  Service: Orthopedics;  Laterality: Left;  [name] MASTECTOMY Right 1999   w/  Node dissection's  . PORT-A-CATH REMOVAL  05/01/1999  . TUBAL LIGATION    . VAGINAL HYSTERECTOMY  1970's  . WRIST GANGLION EXCISION Right 2016   Family History:  Family History  Problem Relation Age of Onset  . Heart disease Father   . Diabetes Father   . Stroke Sister   . Anuerysm Sister 10       brain; maternal half-sister  . Heart disease Brother   . Anuerysm Brother 66       aortic; maternal half-brother  . Breast cancer Daughter 56       negative genetic testing in 2015  . Heart attack Daughter        79-47  . Diabetes Paternal Grandmother   . Stroke Paternal Grandfather   . Anuerysm Brother        NOS type; full brother  . Fibroids Daughter        s/p hysterectomy at 38y  . Brain cancer Maternal Uncle        dx. older than 57; NOS type  . Brain cancer Cousin        maternal 1st cousin; d. early 33s; NOS type  . Deafness Paternal Uncle        prelingual    Social History:  Social History   Tobacco Use  . Smoking status: Never Smoker  . Smokeless tobacco: Never Used  Substance Use Topics  . Alcohol use: No    Alcohol/week: 0.0 standard drinks  . Drug use: No  -Lives at home with  her son and daughter, no known sick contacts  Review of Systems: A complete ROS was negative except as per HPI.   Imaging: EKG: No signs of acute ischemic changes.  Sinus rhythm  CXR:  IMPRESSION: No acute cardiopulmonary disease. Chronic elevation of the right hemidiaphragm.  CT Head: IMPRESSION: No acute intracranial pathology.  Physical Exam: Blood pressure (!) 143/67, pulse 91, temperature 99.1 F (37.3 C), temperature source Oral, resp. rate 19, height 5\' 6"  (1.676 m), weight  57.6 kg, SpO2 99 %.  Physical Exam Vitals reviewed.  Constitutional:      Appearance: Normal appearance. She is normal weight. She is not ill-appearing, toxic-appearing or diaphoretic.  HENT:     Head: Normocephalic and atraumatic.     Mouth/Throat:     Mouth: Mucous membranes are dry.  Eyes:     General: No scleral icterus.       Right eye: No discharge.        Left eye: No discharge.     Extraocular Movements: Extraocular movements intact.     Conjunctiva/sclera: Conjunctivae normal.     Pupils: Pupils are equal, round, and reactive to light.  Cardiovascular:     Rate and Rhythm: Normal rate and regular rhythm.     Pulses: Normal pulses.     Heart sounds: Normal heart sounds. No murmur. No friction rub. No gallop.   Pulmonary:     Effort: Pulmonary effort is normal. No respiratory distress.     Breath sounds: Normal breath sounds. No wheezing or rales.  Abdominal:     General: Abdomen is flat. Bowel sounds are normal. There is no distension.     Palpations: Abdomen is soft.     Tenderness: There is no abdominal tenderness. There is no guarding.  Musculoskeletal:     Right lower leg: No edema.     Left lower leg: No edema.  Skin:    General: Skin is warm and dry.   Neurological: Mental Status: Patient is awake, alert, oriented x3 No signs of aphasia or neglect Cranial Nerves: II: Pupils equal, round, and reactive to light.   III,IV, VI: EOMI without ptosis or diploplia.  V:  Facial sensation is symmetric to light touch and Temperature. VII: Facial movement is symmetric.  VIII: hearing is intact to voice X: Uvula elevates symmetrically XI: Shoulder shrug is symmetric. XII: tongue is midline without atrophy or fasciculations.  Motor:  RUE: 4/5 with shoulder, elbow, and wrist flexion and extension. 4/5 grip strength LUE: 5/5 throughout RLE: 5/5 throughout LLE: 5/5 throughout Sensory: Sensation is grossly intact bilateral UEs & LEs Deep Tendon Reflexes: Absent reflexes in bilateral knees.  No clonus bilaterally. Plantars: Toes are downgoing bilaterally  Assessment & Plan by Problem: Active Problems:   Seizure-like activity ([place])  In summary, [name] is a 69 y.o. yo female w/ PMH significant for seizures, T2DM, hypothyroidism, HTN, HLD, CKD, and breast cancer s/p chemo and R mastectomy who presented to the ED after reportedly having seizure-like activity. Upon further evaluation, it appears that the patient may be having some focal seizures, however her presentation appears to be more consistent with diffuse body cramps in the setting of medication nonadherence. Pt states she is unsure of the medications she takes at home.  #Seizure Like Activity  #Cramping: The patient's presentation with right eye deviation during episodic cramping may be consistent with a focal seizure.  CT of the head did not demonstrate any intracranial abnormalities that can explain the eye deviation. Neurology knowing the patient we appreciate their recommendations.  Patient is status post 1000 mg Keppra load in the ED. -Appreciate Neurology recommendations -MRI brain ordered -EEG in AM -Given additional Keppra 500 mg tonight followed by Keppra 500 twice daily -Telemetry  #Hyperglycemia #T2DM: CBG elevated to 820s. Patient is prescribed Metformin 1000 mg,Tresiba 16 units nightly in addition to Kenalog 8 to 10 units TID. She states she has not taking any of her diabetic medication since  5 days ago.  Her  last A1c was 9.0 on 12/08/2018. -NovoLog 15 units once tonight -Lantus 8 units nightly -SSI -A1c ordered  #HTN: Patient's blood pressure was initially in the 170s on her initial visit, then spiked to the 220s during muscle cramping episodes.  BP subsequently decreased to the 140s.  Patient does not take home antihypertensives.  I suspect her elevated blood pressures are secondary to being in pain. -Monitor vital signs  #AKI: Creatinine was elevated 1.25 from normal levels. S/p 1L LR in the ED. -Daily BMP  #Hypothyroidism: Patient is prescribed Synthroid 100 mcg daily. Unclear if the patient has been taking this.  -Synthroid 60 mcg IV daily -TSH, free T4 ordered  #FEN/GI -Diet: Heart healthy/carb modified -Fluids: None  #DVT prophylaxis -Lovenox 40 mg subcu injections daily  #CODE STATUS: FULL - patient expressed that she wanted to be kept alive so her family to come to the bedside then would not want any further interventions, including chest compressions, cardiac shocks, or intubation.  #Dispo: Admit patient to Inpatient with expected length of stay greater than 2 midnights. Prior to Admission Living Arrangement: Home Anticipated Discharge Location: Home Barriers to Discharge:  Ongoing medical work-up  Signed: Earlene Plater, MD Internal Medicine, PGY1 Pager: [phone]  04/17/2019,7:44 PM

## 2019-04-17 NOTE — ED Notes (Signed)
Pt went to MRI. Unable to get CBG at this time.

## 2019-04-17 NOTE — ED Notes (Signed)
SDU  [phone] pts son [name] would like an update

## 2019-04-17 NOTE — ED Triage Notes (Signed)
Pt arrives via EMS from home with reports of hyperglycemia and seizure like activity. EMS gave 2.5 mg midazolam at 17:57 for seizure. EMS reports it was atypical where the pt was able to talk and tell them to not stick her right arm, but pt was shaking and had a gaze. Pt reported that she has seizures when blood sugar is elevated and has not had her medications in at least 2 days.

## 2019-04-17 NOTE — ED Provider Notes (Signed)
[facility] EMERGENCY DEPARTMENT Provider Note   CSN: [id number] Arrival date & time: 04/17/19  1807     History Chief Complaint  Patient presents with  . Hyperglycemia    [name] is a 69 y.o. female.  HPI Patient is a 69 year old female with a PMH of hypothyroidism, type 2 diabetes, hyperlipidemia, hypertension and CKD presenting to the ED today following seizure-like activity.  Patient says that she began to experience "shaking" this morning and family subsequently called EMS.  Patient says that she has had some vomiting over the past few days but denies abdominal pain, diarrhea, fever.  EMS says that on arrival, patient was experiencing shaking of bilateral upper extremities with gaze deviation.  However, patient was able to speak and tell them not to stick her right arm as she was shaking.  EMS administered 2.5 mg of Versed at 1757.  On arrival in the ED, patient complaining of mild headache.  She says that she has not taken any of her medications for the past week because she forgets.  She denies chest pain, shortness of breath or abdominal pain.    Past Medical History:  Diagnosis Date  . Arthritis   . Bilateral hip pain 02/24/2015  . Borderline glaucoma of both eyes   . Depression   . Diabetic polyneuropathy ([place])   . Diverticulosis of colon   . Dizziness 12/05/2017  . Dry eyes, bilateral   . Feeling of incomplete bladder emptying   . Frequent falls 01/08/2018  . History of breast cancer oncologist-  dr Waymon Budge-- per lov note no recurrence   dx 04/ 1999 --- Stage 3B-- s/p  chemotherapy then right mastectomy then concurrent chemoradiation therapy  . History of urinary retention    01/ 2018  . Hydronephrosis of right kidney   . Hyperlipidemia   . Hypertension   . Hypokalemia 12/09/2016  . Hypomagnesemia 12/09/2016  . Hypothyroidism   . Insulin dependent type 2 diabetes mellitus [facility])    endocrinologist-  dr Cruzita Lederer---  last A1c 8.7 on  02-20-2016  . Lymphedema of upper extremity    right  . Macular degeneration, left eye    followed by dr Zigmund Daniel  . Renal insufficiency   . Urgency of urination     Patient Active Problem List   Diagnosis Date Noted  . Left leg pain 03/15/2019  . Cognitive impairment 01/13/2019  . Visual hallucinations 01/12/2019  . Insomnia 06/11/2018  . Hydronephrosis 04/01/2017  . Sciatica of left side without back pain 01/30/2017  . Neuropathy 12/09/2016  . Genetic testing 11/27/2015  . Preventative health care 08/26/2012  . HTN, goal below 140/90 08/13/2012  . CKD (chronic kidney disease) stage 3, GFR 30-59 ml/min 08/13/2012  . Glaucoma associated with systemic syndromes(365.44) 08/07/2012  . Hypothyroidism 04/16/2006  . Anemia 04/16/2006  . Depression 04/16/2006  . LOW BACK PAIN 04/16/2006  . BREAST CANCER, HX OF 04/16/2006  . Hyperlipidemia 10/13/2003  . Type 2 diabetes mellitus with diabetic polyneuropathy, with long-term current use of insulin ([place]) 04/16/1991    Past Surgical History:  Procedure Laterality Date  . CARPAL TUNNEL RELEASE Right 2017   and tendon repair  . CATARACT EXTRACTION W/ INTRAOCULAR LENS  IMPLANT, BILATERAL  2017  . CYSTO/  RIGHT RETROGRADE PYELOGRAM/  UNROOFING RIGHT URETEROCELE  09/18/2000  . CYSTOSCOPY/RETROGRADE/URETEROSCOPY Right 05/09/2016   Procedure: CYSTOSCOPY and right RETROGRADE;  Surgeon: Irine Seal, MD;  Location: [facility];  Service: Urology;  Laterality: Right;  . FINGER  SURGERY Left x2 prior to 09-09-2014  . I & D EXTREMITY Left 09/09/2014   Procedure: IRRIGATION AND DEBRIDEMENT LEFT THUMB DISTAL PHALANX;  Surgeon: Daryll Brod, MD;  Location: [place];  Service: Orthopedics;  Laterality: Left;  [name] MASTECTOMY Right 1999   w/  Node dissection's  . PORT-A-CATH REMOVAL  05/01/1999  . TUBAL LIGATION    . VAGINAL HYSTERECTOMY  1970's  . WRIST GANGLION EXCISION Right 2016     OB History   No obstetric history on  file.     Family History  Problem Relation Age of Onset  . Heart disease Father   . Diabetes Father   . Stroke Sister   . Anuerysm Sister 49       brain; maternal half-sister  . Heart disease Brother   . Anuerysm Brother 22       aortic; maternal half-brother  . Breast cancer Daughter 40       negative genetic testing in 2015  . Heart attack Daughter        78-47  . Diabetes Paternal Grandmother   . Stroke Paternal Grandfather   . Anuerysm Brother        NOS type; full brother  . Fibroids Daughter        s/p hysterectomy at 87y  . Brain cancer Maternal Uncle        dx. older than 72; NOS type  . Brain cancer Cousin        maternal 1st cousin; d. early 19s; NOS type  . Deafness Paternal Uncle        prelingual    Social History   Tobacco Use  . Smoking status: Never Smoker  . Smokeless tobacco: Never Used  Substance Use Topics  . Alcohol use: No    Alcohol/week: 0.0 standard drinks  . Drug use: No    Home Medications Prior to Admission medications   Medication Sig Start Date End Date Taking? Authorizing Provider  Acetaminophen 325 MG CAPS Take 325 mg by mouth every 6 (six) hours as needed (for pain). 03/15/19   Jose Persia, MD  ALPHAGAN P 0.1 % SOLN Place 1 drop into both eyes 3 (three) times daily. 06/09/17   [provider]  aspirin EC 81 MG tablet Take 81 mg by mouth at bedtime.    [provider]  Besifloxacin HCl (BESIVANCE) 0.6 % SUSP Besivance 0.6 % eye drops,suspension  INSTILL 1 DROP INTO THE LEFT EYE QID X 2 DAYS AFTER EACH MONTHLY EYE INJECTION    [provider]  Blood Glucose Monitoring Suppl (ONETOUCH VERIO) w/Device KIT Check blood sugar up to 5 times a day 11/20/18   Aldine Contes, MD  busPIRone (BUSPAR) 5 MG tablet Take 1 tablet (5 mg total) by mouth 2 (two) times daily. 05/27/18   Aldine Contes, MD  calcium citrate-vitamin D (CITRACAL+D) 315-200 MG-UNIT tablet Take 1 tablet by mouth 2 (two) times daily. Patient  not taking: Reported on 11/20/2017 06/06/16 12/27/17  Aldine Contes, MD  Cholecalciferol (VITAMIN D3) 2000 units TABS Take 2,000 Units at bedtime by mouth.     [provider]  ciprofloxacin (CILOXAN) 0.3 % ophthalmic ointment Ciloxan 0.3 % eye ointment  APPLY TO LEFT EYE Q 2 H WHILE AWAKE    [provider]  Continuous Blood Gluc Receiver ([place]) [place] 1 each by Does not apply route 4 (four) times daily. 04/16/18   Aldine Contes, MD  Continuous Blood Gluc Sensor ([place])  MISC 1 each by Does not apply route 4 (four) times daily. 04/16/18   Aldine Contes, MD  Continuous Blood Gluc Transmit (DEXCOM G6 TRANSMITTER) MISC 1 each by Does not apply route 4 (four) times daily. 04/16/18   Aldine Contes, MD  cycloSPORINE (RESTASIS) 0.05 % ophthalmic emulsion Restasis MultiDose 0.05 % eye drops  INT 1 GTT IN [facility] EYE BID    [provider]  erythromycin ophthalmic ointment APPLY A SMALL AMOUNT INTO LEFT EYE TID 09/09/17   [provider]  glucose 4 GM chewable tablet Chew 4 tablets (16 g total) by mouth as needed for low blood sugar. 05/28/12   Dominic Pea, DO  glucose blood (ONETOUCH VERIO) test strip CHECK BLOOD SUGAR before meals and bedtime 12/04/18   Aldine Contes, MD  insulin degludec (TRESIBA FLEXTOUCH) 100 UNIT/ML SOPN FlexTouch Pen Inject 0.16 mLs (16 Units total) into the skin at bedtime. 10/13/18   Aldine Contes, MD  insulin lispro (HUMALOG KWIKPEN) 100 UNIT/ML KwikPen Inject 0.08-0.1 mLs (8-10 Units total) into the skin 3 (three) times daily. 10/23/18   Philemon Kingdom, MD  Insulin Pen Needle (BD PEN NEEDLE NANO U/F) 32G X 4 MM MISC USE AS DIRECTED 12/04/18   Aldine Contes, MD  ketorolac (ACULAR) 0.5 % ophthalmic solution Place 1 drop into both eyes 3 (three) times daily. 06/11/17   [provider]  levothyroxine (SYNTHROID) 100 MCG tablet TAKE 1 TABLET BY MOUTH EVERY DAY 11/27/18   Aldine Contes, MD    Melatonin 1 MG TABS Take 1 tablet (1 mg total) by mouth daily. 08/25/18   Aldine Contes, MD  metFORMIN (GLUCOPHAGE) 1000 MG tablet Take 1 tablet (1,000 mg total) by mouth 2 (two) times daily with a meal. 12/04/18   Aldine Contes, MD  Nutritional Supplements (GLUCERNA SNACK SHAKE) LIQD Take 1 Dose by mouth 3 (three) times daily as needed. 08/06/17   Aldine Contes, MD  Olopatadine HCl (PATADAY) 0.2 % SOLN Place 1 drop into both eyes daily as needed (dry eyes).     [provider]  OneTouch Delica Lancets 79K MISC Use to check blood sugar before meals and at bedtime 05/27/18   Aldine Contes, MD  Polyethyl Glycol-Propyl Glycol (SYSTANE) 0.4-0.3 % GEL ophthalmic gel Place 1 application daily as needed into both eyes (dry eyes/irritation).     [provider]  prednisoLONE acetate (PRED FORTE) 1 % ophthalmic suspension  09/23/17   [provider]  rosuvastatin (CRESTOR) 20 MG tablet TAKE 1 TABLET BY MOUTH EVERY DAY 12/28/18   Oda Kilts, MD  Semaglutide,0.25 or 0.5MG/DOS, (OZEMPIC, 0.25 OR 0.5 MG/DOSE,) 2 MG/1.5ML SOPN Inject 0.5 mg into the skin once a week. 12/08/18   Aldine Contes, MD  valACYclovir (VALTREX) 500 MG tablet TK 1 T PO QD 09/17/17   [provider]  venlafaxine XR (EFFEXOR-XR) 150 MG 24 hr capsule TAKE 1 CAPSULE BY MOUTH DAILY WITH BREAKFAST 12/04/18   Aldine Contes, MD    Allergies    Gabapentin, Meclizine hcl, Penicillins, and Sulfa antibiotics  Review of Systems   Review of Systems  Constitutional: Negative for chills, fatigue and fever.  HENT: Negative for rhinorrhea and sore throat.   Eyes: Negative for pain and visual disturbance.  Respiratory: Negative for cough and shortness of breath.   Cardiovascular: Negative for chest pain and leg swelling.  Gastrointestinal: Positive for vomiting. Negative for abdominal pain, diarrhea and nausea.  Genitourinary: Negative for dysuria and hematuria.  Musculoskeletal:  Negative for arthralgias and back pain.  Skin: Negative for rash and wound.  Neurological: Positive for headaches. Negative for weakness.  Psychiatric/Behavioral: Negative for agitation.  All other systems reviewed and are negative.   Physical Exam Updated Vital Signs There were no vitals taken for this visit.  Physical Exam Vitals and nursing note reviewed.  Constitutional:      General: She is not in acute distress.    Appearance: Normal appearance. She is well-developed. She is ill-appearing.     Comments: Patient lethargic but answers questions appropriately.  HENT:     Head: Normocephalic and atraumatic.     Right Ear: External ear normal.     Left Ear: External ear normal.     Nose: Nose normal. No congestion or rhinorrhea.     Mouth/Throat:     Mouth: Mucous membranes are dry.     Pharynx: Oropharynx is clear.  Eyes:     Extraocular Movements: Extraocular movements intact.     Pupils: Pupils are equal, round, and reactive to light.  Cardiovascular:     Rate and Rhythm: Normal rate and regular rhythm.     Pulses: Normal pulses.     Heart sounds: Normal heart sounds.  Pulmonary:     Effort: Pulmonary effort is normal. No respiratory distress.     Breath sounds: Normal breath sounds. No stridor. No wheezing, rhonchi or rales.  Abdominal:     General: There is no distension.     Palpations: Abdomen is soft.     Tenderness: There is no abdominal tenderness.  Musculoskeletal:        General: Normal range of motion.     Cervical back: Normal range of motion and neck supple.     Right lower leg: No edema.     Left lower leg: No edema.  Skin:    General: Skin is warm and dry.     Capillary Refill: Capillary refill takes less than 2 seconds.  Neurological:     General: No focal deficit present.     Mental Status: She is oriented to person, place, and time.     Cranial Nerves: No cranial nerve deficit.     Sensory: No sensory deficit.     Motor: Weakness (generalized)  present.     ED Results / Procedures / Treatments   Labs (all labs ordered are listed, but only abnormal results are displayed) Labs Reviewed  CBG MONITORING, ED - Abnormal; Notable for the following components:      Result Value   Glucose-Capillary 520 (*)    All other components within normal limits  I-STAT CHEM 8, ED - Abnormal; Notable for the following components:   Sodium 131 (*)    Chloride 95 (*)    Glucose, Bld >700 (*)    All other components within normal limits  POCT I-STAT EG7 - Abnormal; Notable for the following components:   pCO2, Ven 40.5 (*)    Acid-base deficit 5.0 (*)    Sodium 131 (*)    All other components within normal limits  SARS CORONAVIRUS 2 (TAT 6-24 HRS)  CBC  COMPREHENSIVE METABOLIC PANEL  URINALYSIS, ROUTINE W REFLEX MICROSCOPIC  BETA-HYDROXYBUTYRIC ACID  I-STAT VENOUS BLOOD GAS, ED    EKG EKG Interpretation  Date/Time:  Saturday April 17 2019 18:13:21 EDT Ventricular Rate:  94 PR Interval:    QRS Duration: 99 QT Interval:  374 QTC Calculation: 468 R Axis:   -51 Text Interpretation: Sinus rhythm Left anterior fascicular block Abnormal R-wave progression, late transition Probable left  ventricular hypertrophy no significant change since 2019 Confirmed by Sherwood Gambler [id number]) on 04/17/2019 6:30:06 PM   Radiology No results found.  Procedures Procedures (including critical care time)  Medications Ordered in ED Medications  lactated ringers bolus 1,000 mL (has no administration in time range)    ED Course  I have reviewed the triage vital signs and the nursing notes.  Pertinent labs & imaging results that were available during my care of the patient were reviewed by me and considered in my medical decision making (see chart for details).    MDM Rules/Calculators/A&P                     Patient is a 69 year old female with a PMH of hypothyroidism, type 2 diabetes, hyperlipidemia, hypertension and CKD presenting to the ED today  following seizure-like activity.  On exam, patient appears dry with generalized weakness but is otherwise unremarkable.  BP 136/64, HR 95, RR 17, SPO2 98% on room air.  Afebrile.  On arrival, patient is lethargic and weak but she is displaying no signs of acute distress.  She arouses easily to voice and is able to answer questions appropriately.  Breathing is even, unlabored and she has no signs of acute hypoxia.  Seizure-like activity initially questionable as EMS reports that patient was interactive throughout her shaking.  CBG 520 on arrival.  VBG with pH 7.32, PCO2 40.5, bicarb 20.8.  While in the ED, patient noted to have 2-3 more episodes of shaking.  While I was attempting to obtain ultrasound IV, patient began to experience shaking in the right upper extremity with nystagmus in both eyes.  She was not responsive during this time.  The shaking lasted approximately 20 to 30 seconds.  Patient maintaining her airway through these events and has not become hypoxic.  Although she has had multiple seizures while in the ED, she does appear to return to baseline almost immediately following the events.  Patient loaded with Keppra.  1 L IV fluids initiated for hyperglycemia.  CBC unremarkable.  CMP with sodium 129, chloride 93, CO2 20, glucose 820, creatinine 1.25, anion gap 16.  Urinalysis with large hemoglobin, 30 protein and moderate leukocytes.  No nitrites.  Covid negative.  Chest x-ray shows no acute cardiopulmonary disease.  Patient's seizures potentially related to hyperglycemia especially as they appear partial in nature.  Lab work not consistent with DKA. Obtained CT head which shows no acute intracranial pathology.  Discussed patient's case with neurology team who recommended MRI brain.  At this time, patient admitted to internal medicine service.  For details on hospitalization after admission, please refer to inpatient team's note.  Patient stable at time of admission.  Patient assessed and  evaluated with Dr. Regenia Skeeter.  Nadeen Landau, MD   Final Clinical Impression(s) / ED Diagnoses Final diagnoses:  Hyperglycemia    Rx / DC Orders ED Discharge Orders    None       Nadeen Landau, MD 04/18/19 Denyse Dago    Sherwood Gambler, MD 04/18/19 1539

## 2019-04-17 NOTE — Consult Note (Signed)
Requesting Physician: Dr. Clyde Canterbury    Chief Complaint: Seizure like activity  History obtained from: Patient and Chart    HPI:                                                                                                                                       Carolanne Francisco is a 69 y.o. female with past medical history significant for type 2 diabetes mellitus, hyperlipidemia, hypertension, CKD presents to the emergency department for seizure-like activity described as shaking/cramping of her right arm starting this morning and family called EMS.  EMS stated that patient had jerking bilateral upper extremities and gaze deviation and administered 2.5 mg of Versed. While in the emergency department patient had several spells-on one occasion she had shaking bilateral upper extremities with gaze deviation to the left lasting for few seconds-patient was unresponsive when this happened but regained consciousness shortly after.  There were also episodes where patient would be jerking her right arm while still speaking her.  Patient aware of episodes, states that her right arm become stiff and cramps.  She denies prior history of seizures.  No family history of seizures.  No head trauma  Patient was loaded with Keppra 1 g in the emergency department has not had any further shaking spells.  On assessment, patient alert and oriented with right-sided weakness.   Past Medical History:  Diagnosis Date   Arthritis    Bilateral hip pain 02/24/2015   Borderline glaucoma of both eyes    Depression    Diabetic polyneuropathy ([place])    Diverticulosis of colon    Dizziness 12/05/2017   Dry eyes, bilateral    Feeling of incomplete bladder emptying    Frequent falls 01/08/2018   History of breast cancer oncologist-  dr Waymon Budge-- per lov note no recurrence   dx 04/ 1999 --- Stage 3B-- s/p  chemotherapy then right mastectomy then concurrent chemoradiation therapy   History of urinary retention    01/ 2018   Hydronephrosis of right kidney    Hyperlipidemia    Hypertension    Hypokalemia 12/09/2016   Hypomagnesemia 12/09/2016   Hypothyroidism    Insulin dependent type 2 diabetes mellitus [facility])    endocrinologist-  dr Cruzita Lederer---  last A1c 8.7 on 02-20-2016   Lymphedema of upper extremity    right   Macular degeneration, left eye    followed by dr Zigmund Daniel   Renal insufficiency    Urgency of urination     Past Surgical History:  Procedure Laterality Date   CARPAL TUNNEL RELEASE Right 2017   and tendon repair   CATARACT EXTRACTION W/ INTRAOCULAR LENS  IMPLANT, BILATERAL  2017   CYSTO/  RIGHT RETROGRADE PYELOGRAM/  UNROOFING RIGHT URETEROCELE  09/18/2000   CYSTOSCOPY/RETROGRADE/URETEROSCOPY Right 05/09/2016   Procedure: CYSTOSCOPY and right RETROGRADE;  Surgeon: Irine Seal, MD;  Location: [facility];  Service: Urology;  Laterality: Right;  FINGER SURGERY Left x2 prior to 09-09-2014   I & D EXTREMITY Left 09/09/2014   Procedure: IRRIGATION AND DEBRIDEMENT LEFT THUMB DISTAL PHALANX;  Surgeon: Daryll Brod, MD;  Location: [facility];  Service: Orthopedics;  Laterality: Left;   MASTECTOMY Right 1999   w/  Node dissection's   PORT-A-CATH REMOVAL  05/01/1999   TUBAL LIGATION     VAGINAL HYSTERECTOMY  1970's   WRIST GANGLION EXCISION Right 2016    Family History  Problem Relation Age of Onset   Heart disease Father    Diabetes Father    Stroke Sister    Anuerysm Sister 62       brain; maternal half-sister   Heart disease Brother    Anuerysm Brother 50       aortic; maternal half-brother   Breast cancer Daughter 56       negative genetic testing in 2015   Heart attack Daughter        46-47   Diabetes Paternal Grandmother    Stroke Paternal Grandfather    Anuerysm Brother        NOS type; full brother   Fibroids Daughter        s/p hysterectomy at 57y   Brain cancer Maternal Uncle        dx. older than 13; NOS type   Brain cancer Cousin         maternal 1st cousin; d. early 50s; NOS type   Deafness Paternal Uncle        prelingual   Social History:  reports that she has never smoked. She has never used smokeless tobacco. She reports that she does not drink alcohol or use drugs.  Allergies:  Allergies  Allergen Reactions   Gabapentin Other (See Comments)    Dizziness from 300 mg, tolerates 100 mg Dizziness from 300 mg, tolerates 100 mg    Meclizine Hcl Rash   Penicillins Rash    Has patient had a PCN reaction causing immediate rash, facial/tongue/throat swelling, SOB or lightheadedness with hypotension: Yes Has patient had a PCN reaction causing severe rash involving mucus membranes or skin necrosis: No Has patient had a PCN reaction that required hospitalization: pt was in hospital at time of reaction Has patient had a PCN reaction occurring within the last 10 years: No If all of the above answers are "NO", then may proceed with Cephalosporin use.   Sulfa Antibiotics Rash    Medications:                                                                                                                        I reviewed home medications   ROS:  14 systems reviewed and negative except above   Examination:                                                                                                      General: Appears well-developed and well-nourished.  Psych: Affect appropriate to situation Eyes: No scleral injection HENT: No OP obstrucion Head: Normocephalic.  Cardiovascular: Normal rate and regular rhythm.  Respiratory: Effort normal and breath sounds normal to anterior ascultation GI: Soft.  No distension. There is no tenderness.  Skin: WDI    Neurological Examination Mental Status: Alert, oriented, thought content appropriate.  Speech fluent without evidence of aphasia. Able to  follow 3 step commands without difficulty. Cranial Nerves: II: Visual fields:  III,IV, VI: ptosis not present, extra-ocular motions intact bilaterally, pupils equal, round, reactive to light and accommodation V,VII: smile symmetric, facial light touch sensation normal bilaterally VIII: hearing normal bilaterally IX,X: uvula rises symmetrically XI: bilateral shoulder shrug XII: midline tongue extension Motor: Right : Upper extremity   4/5    Left:     Upper extremity   5/5  Lower extremity   4/5     Lower extremity   5/5 Tone and bulk:normal tone throughout; no atrophy noted Sensory: Pinprick and light touch intact throughout, bilaterally Deep Tendon Reflexes: 2+ and symmetric throughout Plantars: Right: downgoing   Left: downgoing Cerebellar: normal finger-to-nose, no ataxia seen   Lab Results: Basic Metabolic Panel: Recent Labs  Lab 04/17/19 1834 04/17/19 1836  NA 131* 131*  K 4.0 4.0  CL  --  95*  GLUCOSE  --  >700*  BUN  --  21  CREATININE  --  0.90    CBC: Recent Labs  Lab 04/17/19 1834 04/17/19 1836  HGB 13.6 13.3  HCT 40.0 39.0    Coagulation Studies: No results for input(s): LABPROT, INR in the last 72 hours.  Imaging: DG Chest Portable 1 View  Result Date: 04/17/2019 CLINICAL DATA:  Potential seizure, evaluate for pneumonia EXAM: PORTABLE CHEST 1 VIEW COMPARISON:  Radiograph 02/19/2016 FINDINGS: Chronic elevation of the right hemidiaphragm. Minimal right basilar atelectasis. No consolidation, features of edema, pneumothorax, or effusion. Pulmonary vascularity is normally distributed. The cardiomediastinal contours are unremarkable. No acute osseous or soft tissue abnormality. Degenerative changes are present in the imaged spine and shoulders. Stable postsurgical changes in the right axilla. Telemetry leads overlie the chest. IMPRESSION: No acute cardiopulmonary disease. Chronic elevation of the right hemidiaphragm. Electronically Signed   By: Lovena Le  M.D.   On: 04/17/2019 19:19     I have reviewed the above imaging : MRI brain and CT head shows no acute stroke   ASSESSMENT AND PLAN   69 y.o. female with past medical history significant for type 2 diabetes mellitus, hyperlipidemia, hypertension, CKD presents to the emergency department for seizure-like activity described as shaking/cramping of her right arm starting this morning and family called EMS. I did not witness any jerking movements, has not had any further spells since Keppra started. Her right arm and leg does appear weaker, so I do suspect she may  have had focal seizures.   Focal seizures Right-sided Todd's paresis Hyperglycemia  Recommendations Continue Keppra 500 mg twice daily MRI brain with and without contrast Routine EEG in the morning, if patient has more spells consider continuous EEG Seizure precautions Ativan 2 mg for seizure lasting greater than 2 minutes Control hypoglycemia Address metabolic disturbances and rule out infection  Xaniyah Buchholz [facility] Pager Number [phone]

## 2019-04-17 NOTE — ED Notes (Signed)
Pt. Seizing, lasted about 30 seconds; upper body movement present, lower body movement absent. Nurse and MD at bedside. Pt stated she can hear [place] but just couldn't respond.

## 2019-04-18 DIAGNOSIS — R739 Hyperglycemia, unspecified: Secondary | ICD-10-CM

## 2019-04-18 LAB — GLUCOSE, CAPILLARY
Glucose-Capillary: 125 mg/dL — ABNORMAL HIGH (ref 70–99)
Glucose-Capillary: 174 mg/dL — ABNORMAL HIGH (ref 70–99)
Glucose-Capillary: 194 mg/dL — ABNORMAL HIGH (ref 70–99)
Glucose-Capillary: 244 mg/dL — ABNORMAL HIGH (ref 70–99)
Glucose-Capillary: 267 mg/dL — ABNORMAL HIGH (ref 70–99)
Glucose-Capillary: 271 mg/dL — ABNORMAL HIGH (ref 70–99)
Glucose-Capillary: 283 mg/dL — ABNORMAL HIGH (ref 70–99)

## 2019-04-18 LAB — BASIC METABOLIC PANEL
Anion gap: 14 (ref 5–15)
BUN: 16 mg/dL (ref 8–23)
CO2: 23 mmol/L (ref 22–32)
Calcium: 9.8 mg/dL (ref 8.9–10.3)
Chloride: 103 mmol/L (ref 98–111)
Creatinine, Ser: 0.98 mg/dL (ref 0.44–1.00)
GFR calc Af Amer: >60 mL/min (ref >60)
GFR calc non Af Amer: 59 mL/min — ABNORMAL LOW (ref >60)
Glucose, Bld: 184 mg/dL — ABNORMAL HIGH (ref 70–99)
Potassium: 3.3 mmol/L — ABNORMAL LOW (ref 3.5–5.1)
Sodium: 140 mmol/L (ref 135–145)

## 2019-04-18 LAB — HEMOGLOBIN A1C
Hgb A1c MFr Bld: 14.4 % — ABNORMAL HIGH (ref 4.8–5.6)
Mean Plasma Glucose: 366.58 mg/dL

## 2019-04-18 LAB — CK: Total CK: 68 U/L (ref 38–234)

## 2019-04-18 LAB — CBG MONITORING, ED
Glucose-Capillary: 215 mg/dL — ABNORMAL HIGH (ref 70–99)
Glucose-Capillary: 339 mg/dL — ABNORMAL HIGH (ref 70–99)

## 2019-04-18 LAB — TSH: TSH: 11.781 u[IU]/mL — ABNORMAL HIGH (ref 0.350–4.500)

## 2019-04-18 LAB — HIV ANTIBODY (ROUTINE TESTING W REFLEX): HIV Screen 4th Generation wRfx: NONREACTIVE

## 2019-04-18 LAB — SARS CORONAVIRUS 2 (TAT 6-24 HRS): SARS Coronavirus 2: NEGATIVE

## 2019-04-18 LAB — T4, FREE: Free T4: 0.95 ng/dL (ref 0.61–1.12)

## 2019-04-18 LAB — MRSA PCR SCREENING: MRSA by PCR: NEGATIVE

## 2019-04-18 LAB — MAGNESIUM: Magnesium: 1.9 mg/dL (ref 1.7–2.4)

## 2019-04-18 MED ORDER — ROSUVASTATIN CALCIUM 20 MG PO TABS
20.0000 mg | ORAL_TABLET | Freq: Every day | ORAL | Status: DC
  Administered 2019-04-18 – 2019-04-21 (×4): 20 mg via ORAL
  Filled 2019-04-18 (×4): qty 1

## 2019-04-18 MED ORDER — LEVOTHYROXINE SODIUM 100 MCG PO TABS
100.0000 ug | ORAL_TABLET | Freq: Every day | ORAL | Status: DC
  Administered 2019-04-19 – 2019-04-21 (×3): 100 ug via ORAL
  Filled 2019-04-18 (×3): qty 1

## 2019-04-18 MED ORDER — POTASSIUM CHLORIDE CRYS ER 20 MEQ PO TBCR
40.0000 meq | EXTENDED_RELEASE_TABLET | Freq: Four times a day (QID) | ORAL | Status: AC
  Administered 2019-04-18: 40 meq via ORAL
  Filled 2019-04-18: qty 2

## 2019-04-18 MED ORDER — POLYETHYL GLYCOL-PROPYL GLYCOL 0.4-0.3 % OP GEL
1.0000 "application " | Freq: Every day | OPHTHALMIC | Status: DC | PRN
  Filled 2019-04-18: qty 10

## 2019-04-18 NOTE — ED Notes (Signed)
CBG Results of 339 reported to [place], Therapist, sports.

## 2019-04-18 NOTE — Plan of Care (Signed)

## 2019-04-18 NOTE — Progress Notes (Signed)
Subjective: No further seizures  Exam: Vitals:   04/18/19 1200 04/18/19 1300  BP: 115/77 135/70  Pulse: 78 78  Resp: 20 14  Temp: 97.9 F (36.6 C)   SpO2: 100% 98%   Gen: In bed, NAD Resp: non-labored breathing, no acute distress Abd: soft, nt  Neuro: MS: awake, alert, gives month as august and year as 2020 [id number], EOMI Motor: Face symmetric Sensory: Intact light touch  Pertinent Labs: Glucose 283  Impression: 69 year old female with new onset seizures in the setting of glucose greater than 800.  I suspect that this is provoked seizure, and though she will not need long-term antiepileptic therapy I would continue it for a week or so.  EEG is pending, but there is negative then I would not continue long-term AEDs.  Recommendations: 1) continue Keppra for 1 week unless EEG is abnormal 2) neurology will follow up EEG, but if it is negative there will be no further recommendations.  Roland Rack, MD [facility] [phone]  If 7pm- 7am, please page neurology on call as listed in [place].

## 2019-04-18 NOTE — Progress Notes (Addendum)
Patient ID: [name], female   DOB: [birth year], 69 y.o.   MRN: [id number]   Subjective:  No acute overnight events.  [name] was seen this morning on rounds and was sleeping in bed but awoke to light touch. She was quiet, but cooperative with the examination and responded to questions. However she was slow to respond and slow to move. She did not report any further seizure like activity, but did sate that her right shoulder was in pain and her right forearm was tender as well. She also reports constipation.    Objective:  Vital signs in last 24 hours: Vitals:   04/18/19 0030 04/18/19 0100 04/18/19 0222 04/18/19 0900  BP: (!) 121/46 (!) 148/41 (!) 130/53 (!) 129/51  Pulse: 85 86 76 76  Resp: 18  14 16   Temp:   97.8 F (36.6 C) 97.8 F (36.6 C)  TempSrc:   Oral Oral  SpO2: 97% 98% 99% 99%  Weight:   55.2 kg   Height:       Weight change:  No intake or output data in the 24 hours ending 04/18/19 1218  Physical Exam Eyes:     Extraocular Movements: Extraocular movements intact.  Cardiovascular:     Rate and Rhythm: Normal rate and regular rhythm.     Pulses: Normal pulses.     Heart sounds: Normal heart sounds.  Pulmonary:     Effort: Pulmonary effort is normal.     Breath sounds: Normal breath sounds.  Abdominal:     General: Bowel sounds are normal.  Musculoskeletal:     Right shoulder: Tenderness present.     Comments: Pain in anterior shoulder with motion   Skin:    General: Skin is warm and dry.  Neurological:     General: No focal deficit present.     Mental Status: She is oriented to person, place, and time and easily aroused.     Motor: Motor function is intact.  Psychiatric:        Speech: Speech is delayed.        Behavior: Behavior is withdrawn.     Ref Range & Units 02:08 2 yr ago 3 yr ago  Total CK 38 - 234 U/L 68  63  98    CBG: 04/18/19  10:48  267 04/18/19  08:45  194 04/18/19  03:55  125  Free  T4: 03/21  0.95 04/20  0.88 03/20  0.55  TSH: 03/21  11.781 02/21  25.000 01/21  15.200  CBC Latest Ref Rng & Units 04/17/2019 04/17/2019 04/17/2019  WBC 4.0 - 10.5 K/uL 7.4 - -  Hemoglobin 12.0 - 15.0 g/dL 14.4 13.3 13.6  Hematocrit 36.0 - 46.0 % 42.7 39.0 40.0  Platelets 150 - 400 K/uL 263 - -   BMP Latest Ref Rng & Units 04/18/2019 04/17/2019 04/17/2019  Glucose 70 - 99 mg/dL 184(H) 820(HH) >700(HH)  BUN 8 - 23 mg/dL 16 20 21   Creatinine 0.44 - 1.00 mg/dL 0.98 1.25(H) 0.90  BUN/Creat Ratio 12 - 28 - - -  Sodium 135 - 145 mmol/L 140 129(L) 131(L)  Potassium 3.5 - 5.1 mmol/L 3.3(L) 5.0 4.0  Chloride 98 - 111 mmol/L 103 93(L) 95(L)  CO2 22 - 32 mmol/L 23 20(L) -  Calcium 8.9 - 10.3 mg/dL 9.8 9.4 -   MRI brain without contrast: 1. Technically limited exam due to extensive motion artifact and patient's inability to tolerate the full length study. 2. Questionable 4 mm focus of diffusion  abnormality at the inferior left thalamus/mesial left temporal lobe. Artifact is favored on this motion degraded exam. Possible small acute ischemic infarct not entirely excluded, and could considered in the correct clinical setting. 3. Otherwise stable and negative brain MRI for age.  Assessment/Plan:  Active Problems:   Hyperglycemia   Seizure-like activity ([place])  Summary: [name] is a 69 year old female with a past medical history of seizures, DM type II, hypothyroidism, HLD, CKD, and breast cancer s/p chemo and R mastectomy who presented due to seizure like activity and myoclonus and was admitted of further evaluation.    Seizure like activity: The patient has not reported further seizure like activity. Her daughter reports that yesterday she found the patient shaking uncontrollably and called EMS after 2 episodes of this. She also says that a few days ago the patient said she feels like she may not be around much longer. The patient's daughter also mentioned that the patient's sister  died last year and that the patient has been depressed and increasingly withdrawn since. Given the patient's conversation with her daughter, increased hypertension, myoclonus like activity, and spasms it is possible her presentation is due to either an intentional or unintentional SNRI overdose causing mild serotonin syndrome. She does have history of seizures, so focal seizures are still a possibility. However, she has normal CK levels. Additionally MRI showed possible CVA v artifact which could be contributing to her seizure like activity. Her hyperglycemia on presentation could also have contributed to her seizure like activity. Her slowed response with movement and speech is likely due to keppra.  - EEG pending  - we appreciate neurology recommendations - continue keppra  - monitor for venlafaxine withdrawal  - nurse contracted for pill count, but medications have been lost during transport - telemetry    Hyperglycemia, DMII: The patient reported not taking medications for 6 days and on presnation in 820s. Now down to 267, ws in 120s overnight.  -SSI  -Lantus 8 units nightly   HTN:  Patient does not take home antihypertensives.  I suspect her elevated blood pressures may be related to mild serotonin syndrome or pain.  - Monitor vital signs   AKI:  Creatinine was elevated 1.25 on presentation, which is now back to normal at 0.98.  - Daily BMP   Hypothyroidism: Free T4 levels are normal. Elevated TSH.  - continue levothyroxine    VTE ppx: lovenox 40 mg Diet: heart healthy/carb modified     LOS: 1 day   Mikael Spray, Medical Student 04/18/2019, 12:18 PM    Attestation for Student Documentation:  I personally was present and performed or re-performed the history, physical exam and medical decision-making activities of this service and have verified that the service and findings are accurately documented in the students note.  Jean Rosenthal, MD 04/18/2019, 1:12 PM

## 2019-04-18 NOTE — Evaluation (Signed)
Physical Therapy Evaluation Patient Details Name: [name] MRN: [id number] DOB: November 09, 1950 Today's Date: 04/18/2019   History of Present Illness   69 y.o. yo female w/ PMH significant for seizures, T2DM, hypothyroidism, HTN, HLD, CKD, and breast cancer s/p chemo and R mastectomy who presented to the ED after reportedly having seizure-like activity.   Clinical Impression  Pt admitted with above. Per pt she was indep and amb without AD PTA. Pt now maxA for all transfers and unable to ambulate at this time. Pt unable to maintain EOB sitting balance and requires assist for all ADLs. At this time I'm recommending ST-SNF to address impairments listed below. Acute PT to cont to follow.     Follow Up Recommendations SNF;Supervision/Assistance - 24 hour    Equipment Recommendations  (TBD at next venue)    Recommendations for Other Services       Precautions / Restrictions Precautions Precautions: Fall Precaution Comments: seizures Restrictions Weight Bearing Restrictions: No      Mobility  Bed Mobility Overal bed mobility: Needs Assistance Bed Mobility: Supine to Sit;Sit to Supine     Supine to sit: Max assist Sit to supine: Max assist   General bed mobility comments: pt with minimal initiation, pt let gravity assist in returning to supine, pt holding self in fetal position  Transfers Overall transfer level: Needs assistance               General transfer comment: deferred as pt was unable to sit EOB, would require 2 person assist for safe sit to stand transfer  Ambulation/Gait             General Gait Details: deferred  Stairs            Wheelchair Mobility    Modified Rankin (Stroke Patients Only)       Balance Overall balance assessment: Needs assistance Sitting-balance support: Feet unsupported;No upper extremity supported Sitting balance-Leahy Scale: Zero Sitting balance - Comments: pt unable to right self, pt requriing maxA to maintain  upright posture, pt with posterior, L lateral lean, pt unable to self correct Postural control: Posterior lean;Left lateral lean                                   Pertinent Vitals/Pain Pain Assessment: Faces Faces Pain Scale: No hurt    Home Living Family/patient expects to be discharged to:: Private residence Living Arrangements: Children(son and daughter) Available Help at Discharge: Family;Available 24 hours/day Type of Home: House Home Access: Stairs to enter Entrance Stairs-Rails: Can reach both Entrance Stairs-Number of Steps: 4 Home Layout: One level Home Equipment: None Additional Comments: unsure of accuracy of report of PLOF    Prior Function Level of Independence: Independent         Comments: per patient she didn't need AD, could complete ADLs and do simple meal prep for self PTA, unsure of accuracy of pt report     Hand Dominance        Extremity/Trunk Assessment   Upper Extremity Assessment Upper Extremity Assessment: Generalized weakness(holds bilat UEs in flexion)    Lower Extremity Assessment Lower Extremity Assessment: Generalized weakness(hold bilat LEs in hip/knee flexion/fetal position)    Cervical / Trunk Assessment Cervical / Trunk Assessment: Kyphotic  Communication   Communication: No difficulties  Cognition Arousal/Alertness: Lethargic(pt sleeping upon arrival, kept eyes closed) Behavior During Therapy: Flat affect Overall Cognitive Status: Impaired/Different from baseline Area of Impairment: Orientation;Following  commands;Problem solving                 Orientation Level: Disoriented to;Time;Situation;Place(stated Biden for president)     Following Commands: Follows one step commands with increased time;Follows one step commands consistently;Follows multi-step commands inconsistently     Problem Solving: Slow processing;Decreased initiation;Difficulty sequencing;Requires verbal cues;Requires tactile  cues General Comments: pt sleeping upon arrival, verbal cues to maintain eyes open, pt difficulty with stating date even with verbal cues      General Comments General comments (skin integrity, edema, etc.): BP increased to 176/147 and 209/140 while sitting EOB,BP cuff around L calf, upon returning to supine BP 171/61. RN notified    Exercises     Assessment/Plan    PT Assessment Patient needs continued PT services  PT Problem List Decreased strength;Decreased range of motion;Decreased activity tolerance;Decreased balance;Decreased mobility;Decreased coordination;Decreased cognition;Decreased knowledge of use of DME;Decreased safety awareness       PT Treatment Interventions DME instruction;Gait training;Stair training;Functional mobility training;Therapeutic activities;Therapeutic exercise;Balance training;Neuromuscular re-education;Cognitive remediation    PT Goals (Current goals can be found in the Care Plan section)  Acute Rehab PT Goals PT Goal Formulation: Patient unable to participate in goal setting Time For Goal Achievement: 05/02/19 Potential to Achieve Goals: Fair    Frequency Min 3X/week   Barriers to discharge   unsure of how much assist pt truly has    Co-evaluation               AM-PAC PT "6 Clicks" Mobility  Outcome Measure Help needed turning from your back to your side while in a flat bed without using bedrails?: Total Help needed moving from lying on your back to sitting on the side of a flat bed without using bedrails?: Total Help needed moving to and from a bed to a chair (including a wheelchair)?: Total Help needed standing up from a chair using your arms (e.g., wheelchair or bedside chair)?: Total Help needed to walk in hospital room?: Total Help needed climbing 3-5 steps with a railing? : Total 6 Click Score: 6    End of Session   Activity Tolerance: Patient tolerated treatment well Patient left: in bed;with call bell/phone within reach;with  bed alarm set Nurse Communication: Mobility status(elevated BP) PT Visit Diagnosis: Unsteadiness on feet (R26.81);Muscle weakness (generalized) (M62.81);Difficulty in walking, not elsewhere classified (R26.2)    Time: [id number] PT Time Calculation (min) (ACUTE ONLY): 18 min   Charges:   PT Evaluation $PT Eval Moderate Complexity: 1 Mod          Kittie Plater, PT, DPT Acute Rehabilitation Services Pager #: [phone] Office #: [phone]   Berline Lopes 04/18/2019, 11:15 AM

## 2019-04-18 NOTE — Plan of Care (Signed)

## 2019-04-19 ENCOUNTER — Inpatient Hospital Stay (HOSPITAL_COMMUNITY): Payer: Medicare Other

## 2019-04-19 DIAGNOSIS — E039 Hypothyroidism, unspecified: Secondary | ICD-10-CM

## 2019-04-19 DIAGNOSIS — E1165 Type 2 diabetes mellitus with hyperglycemia: Principal | ICD-10-CM

## 2019-04-19 DIAGNOSIS — Z79899 Other long term (current) drug therapy: Secondary | ICD-10-CM

## 2019-04-19 DIAGNOSIS — N189 Chronic kidney disease, unspecified: Secondary | ICD-10-CM

## 2019-04-19 DIAGNOSIS — Z794 Long term (current) use of insulin: Secondary | ICD-10-CM

## 2019-04-19 DIAGNOSIS — K5900 Constipation, unspecified: Secondary | ICD-10-CM

## 2019-04-19 DIAGNOSIS — E1122 Type 2 diabetes mellitus with diabetic chronic kidney disease: Secondary | ICD-10-CM

## 2019-04-19 DIAGNOSIS — Z79890 Hormone replacement therapy (postmenopausal): Secondary | ICD-10-CM

## 2019-04-19 DIAGNOSIS — N179 Acute kidney failure, unspecified: Secondary | ICD-10-CM

## 2019-04-19 DIAGNOSIS — I129 Hypertensive chronic kidney disease with stage 1 through stage 4 chronic kidney disease, or unspecified chronic kidney disease: Secondary | ICD-10-CM

## 2019-04-19 LAB — CBC
HCT: 38.2 % (ref 36.0–46.0)
Hemoglobin: 13 g/dL (ref 12.0–15.0)
MCH: 30.9 pg (ref 26.0–34.0)
MCHC: 34 g/dL (ref 30.0–36.0)
MCV: 90.7 fL (ref 80.0–100.0)
Platelets: 263 10*3/uL (ref 150–400)
RBC: 4.21 MIL/uL (ref 3.87–5.11)
RDW: 12.5 % (ref 11.5–15.5)
WBC: 10.2 10*3/uL (ref 4.0–10.5)
nRBC: 0 % (ref 0.0–0.2)

## 2019-04-19 LAB — COMPREHENSIVE METABOLIC PANEL
ALT: 13 U/L (ref 0–44)
AST: 19 U/L (ref 15–41)
Albumin: 3 g/dL — ABNORMAL LOW (ref 3.5–5.0)
Alkaline Phosphatase: 64 U/L (ref 38–126)
Anion gap: 10 (ref 5–15)
BUN: 21 mg/dL (ref 8–23)
CO2: 25 mmol/L (ref 22–32)
Calcium: 9.5 mg/dL (ref 8.9–10.3)
Chloride: 107 mmol/L (ref 98–111)
Creatinine, Ser: 0.9 mg/dL (ref 0.44–1.00)
GFR calc Af Amer: >60 mL/min (ref >60)
GFR calc non Af Amer: >60 mL/min (ref >60)
Glucose, Bld: 90 mg/dL (ref 70–99)
Potassium: 4 mmol/L (ref 3.5–5.1)
Sodium: 142 mmol/L (ref 135–145)
Total Bilirubin: 0.6 mg/dL (ref 0.3–1.2)
Total Protein: 6.1 g/dL — ABNORMAL LOW (ref 6.5–8.1)

## 2019-04-19 LAB — GLUCOSE, CAPILLARY
Glucose-Capillary: 209 mg/dL — ABNORMAL HIGH (ref 70–99)
Glucose-Capillary: 142 mg/dL — ABNORMAL HIGH (ref 70–99)
Glucose-Capillary: 260 mg/dL — ABNORMAL HIGH (ref 70–99)
Glucose-Capillary: 394 mg/dL — ABNORMAL HIGH (ref 70–99)
Glucose-Capillary: 227 mg/dL — ABNORMAL HIGH (ref 70–99)

## 2019-04-19 MED ORDER — SENNA 8.6 MG PO TABS
1.0000 | ORAL_TABLET | Freq: Every day | ORAL | Status: DC | PRN
  Administered 2019-04-19 – 2019-04-20 (×2): 8.6 mg via ORAL
  Filled 2019-04-19 (×2): qty 1

## 2019-04-19 MED ORDER — POLYETHYLENE GLYCOL 3350 17 G PO PACK
17.0000 g | PACK | Freq: Every day | ORAL | Status: DC | PRN
  Administered 2019-04-19: 17 g via ORAL
  Filled 2019-04-19: qty 1

## 2019-04-19 NOTE — Progress Notes (Signed)
Patient ID: [name], female   DOB: 1950/12/20, 69 y.o.   MRN: [id number]   Subjective: HD: 2  No acute overnight events.   [name] was seen on rounds this morning and appears much better today. She was sitting in bed and was having electrodes placed for EEG. She was more talkative and moving more quickly. She denies any pain, seizures, headaches, or dizziness.  Revisited her this afternoon to preform physical exam. At this time she complained of abdominal pain due to constipation. She also complained of memory problems, specifically difficulty remembering things she needs to do such as take her medication. She says both constipation and memory issues were a problem before admission.    Objective:  Vital signs in last 24 hours: Vitals:   04/19/19 0341 04/19/19 0800 04/19/19 1200 04/19/19 1300  BP: (!) 99/59 (!) 106/54 (!) 94/58 112/80  Pulse: 80 84 88 87  Resp: 19 13 (!) 21 19  Temp: 97.6 F (36.4 C) 97.9 F (36.6 C) 97.8 F (36.6 C)   TempSrc: Oral Oral Oral   SpO2: 98% 99% 96% 98%  Weight:      Height:       Weight change:   Intake/Output Summary (Last 24 hours) at 04/19/2019 1517 Last data filed at 04/19/2019 0555 Gross per 24 hour  Intake -  Output 250 ml  Net -250 ml   Physical Exam Cardiovascular:     Rate and Rhythm: Normal rate and regular rhythm.     Pulses: Normal pulses.     Heart sounds: Normal heart sounds.  Pulmonary:     Effort: Pulmonary effort is normal.     Breath sounds: Normal breath sounds.  Abdominal:     Palpations: Abdomen is soft.     Tenderness: There is abdominal tenderness.  Skin:    General: Skin is warm and dry.  Neurological:     General: No focal deficit present.     Mental Status: She is alert.     Motor: Motor function is intact. No weakness.     Comments: Oriented to person and place. Oriented to year but not month.   Psychiatric:        Speech: Speech normal.     CBC Latest Ref Rng & Units 04/19/2019 04/17/2019  04/17/2019  WBC 4.0 - 10.5 K/uL 10.2 7.4 -  Hemoglobin 12.0 - 15.0 g/dL 13.0 14.4 13.3  Hematocrit 36.0 - 46.0 % 38.2 42.7 39.0  Platelets 150 - 400 K/uL 263 263 -   CMP Latest Ref Rng & Units 04/19/2019 04/18/2019 04/17/2019  Glucose 70 - 99 mg/dL 90 184(H) 820(HH)  BUN 8 - 23 mg/dL 21 16 20   Creatinine 0.44 - 1.00 mg/dL 0.90 0.98 1.25(H)  Sodium 135 - 145 mmol/L 142 140 129(L)  Potassium 3.5 - 5.1 mmol/L 4.0 3.3(L) 5.0  Chloride 98 - 111 mmol/L 107 103 93(L)  CO2 22 - 32 mmol/L 25 23 20(L)  Calcium 8.9 - 10.3 mg/dL 9.5 9.8 9.4  Total Protein 6.5 - 8.1 g/dL 6.1(L) - 7.9  Total Bilirubin 0.3 - 1.2 mg/dL 0.6 - 0.7  Alkaline Phos 38 - 126 U/L 64 - 111  AST 15 - 41 U/L 19 - 24  ALT 0 - 44 U/L 13 - 18   CBG: 08:03 03/22  142 03:39 03/22  209   03/21  271  EEG: Continuous slow, generalized.  Suggestive of diffuse encephalopathy, nonspecific etiology. No seizures or epileptiform discharges seen.   Assessment/Plan:  Active  Problems:   Hyperglycemia   Seizure-like activity ([place])  Summary:  [name] is a 69 year old female with a past medical history of seizures, DM type II, hypothyroidism, and CKD who presented with seizure like activity and myoclonus and was admitted for further evaluation.   Seizure like activity: The patient has had no further episodes of seizure like activity. Neurology was consulted and agrees that this is a provoked seizure. Likely due to hyperglycemia given that blood glucose was in 800s and no further seizure activity has occurred since blood glucose level has been brought down. Now 142-200s. Additionally the presentation of her seizures with focal myotonic seizure activity is consistent with hyperglycemic-non-ketotic induced seizures. EEG showed no seizures or epileptiform discharges.   - continue keppra for a total of 1 week  Possible encephalopathy: EEG results from today were suggestive of diffuse encephalopathy. Given her history of CKD it is  possible that she is not clearing the Keppra she is receiving well and this is leading to the encephalopathy. However her creatinine has improved and GFR is calculated to be greater than 60, she also is still on keppra today and clinically appears much more aware and communicative. The encephalopathy likely could be part of a post ictal state from recent seizures. Her recent hyperglycemia could also be a contributing factor. She is also at high risk for depression which may also be contributing.   Hyperglycemia, DMII: On presnetation blood glucose was in 820s, now down to 140-200s. Seen by inpatient diabetes program; appreciate recommendations.  - continue Lantus 8U nightly - continue novolog sensitive correction Q4H - add novolog 2 U TID if eats 50%  HTN: Patient does not take home antihypertensives. Hypertension has now resolved and has been slightly hypotensive since last night with BP 90s-100s/50s. - Monitor vital signs   AKI: Creatinine was elevated 1.25 on presentation, which is now back to normal at 0.90.  - Daily BMP   Hypothyroidism: Free T4 levels are normal. Elevated TSH. History of hypothyroidism.  - continue levothyroxine    VTE ppx: lovenox 40 mg Diet: heart healthy/carb modified PT recs: 5x/wk OT recs: 5x/wk Dispo: SNF     LOS: 2 days   Mikael Spray, Medical Student 04/19/2019, 3:17 PM

## 2019-04-19 NOTE — Plan of Care (Signed)
EEG negative for acute process. No additional recs. Please refer to Dr. Cecil Cobbs last note for recs.  Please recall inpatient neurology with questions as needed.  -- Amie Portland, MD [facility] Pager: [phone] If 7pm to 7am, please call on call as listed on [facility].

## 2019-04-19 NOTE — Progress Notes (Signed)
EEG complete - results pending 

## 2019-04-19 NOTE — TOC Progression Note (Addendum)
Transition of Care [facility]) - Progression Note    Patient Details  Name: [name] MRN: [id number] Date of Birth: 1950-07-02  [facility] CM/SW [place], RN Phone Number: 04/19/2019, 3:54 PM  Clinical Narrative:  Patient and son at bedside both agreeable for info to be faxed out for bed offers.Spoke with son [name] to make aware of bed offer from  [facility]. This was patient and sons second choice for placement. Son states that he wanted to speak to someone that he knows that was at [facility] before making a final decision. Son states that he will call this CM back with the final decision. MD notified for new covid test if patient is medically stable for discharge tomorrow. Will continue to follow.        Expected Discharge Plan and Services                                                 Social Determinants of Health (SDOH) Interventions    Readmission Risk Interventions No flowsheet data found.

## 2019-04-19 NOTE — Progress Notes (Addendum)
   Vital Signs MEWS/VS Documentation      04/18/2019 2018 04/18/2019 2300 04/18/2019 2345 04/19/2019 0030   MEWS Score:  0  1  2  2    MEWS Score Color:  Green  Green  Yellow  Yellow   Resp:  --  13  13  --   Pulse:  --  --  88  --   BP:  --  --  (!) 92/46  --   Temp:  --  --  98.2 F (36.8 C)  --   O2 Device:  --  --  Room Air  --   Level of Consciousness:  Alert  --  --  Alert      Patient is asymptomatic, current BP is 101/54. Christy Corpus NP paged no new orders were given    [name] 04/19/2019,2:12 AM

## 2019-04-19 NOTE — Consult Note (Signed)
   [facility] CM Inpatient Consult   04/19/2019  Nataya Phu 10/12/1950 [id number]   Patient was assessed for [facility] for community services. Patient was previously referred from MD office with outreach attempts from [place] [facility], who was unable to connect with the patient.    Chart review of MD progress notes from History and Physical [attested notes] reveals patient admitted with Hyperglycemia and new onset of seizure.  Patient's Hgb A1C noted in labs as 14.4 on 04/18/19 noted.  Plan:  Will follow for progress in the inpatient Transition of Care team notes, PT/OT evaluations, etc for disposition/transition of care needs and attempt to get best contact information, if appropriate.  Patient's primary care provider is in the [place] practice with [facility].  Will follow up disposition and needs with [facility] Chronic [facility] Management team to update and follow.  Patient with [employer] noted. Of note, [facility] Care Management services does not replace or interfere with any services that are arranged by inpatient [facility] care management team.   For additional questions or referrals please contact:  Natividad Brood, RN BSN [facility] Liaison  [phone] business mobile phone Toll free office [phone]  Fax number: [phone] [email or web address]

## 2019-04-19 NOTE — NC FL2 (Signed)
[place] MEDICAID FL2 LEVEL OF CARE SCREENING TOOL     IDENTIFICATION  Patient Name: [name] Birthdate: 04-12-1950 Sex: female Admission Date (Current Location): 04/17/2019  [facility] and [place] Number:  Herbalist and Address:  [facility] [place], [place] [address] [place], [place] [address]      Provider Number: [id number]  Attending Physician Name and Address:  Axel Filler, *  Relative Name and Phone Number:       Current Level of Care: Hospital Recommended Level of Care: [place] Prior Approval Number:    Date Approved/Denied:   PASRR Number: [id number]  Discharge Plan: SNF    Current Diagnoses: Patient Active Problem List   Diagnosis Date Noted  . Seizure-like activity ([place]) 04/17/2019  . Left leg pain 03/15/2019  . Cognitive impairment 01/13/2019  . Visual hallucinations 01/12/2019  . Insomnia 06/11/2018  . Hydronephrosis 04/01/2017  . Sciatica of left side without back pain 01/30/2017  . Neuropathy 12/09/2016  . Genetic testing 11/27/2015  . Hyperglycemia 07/20/2013  . Preventative health care 08/26/2012  . HTN, goal below 140/90 08/13/2012  . CKD (chronic kidney disease) stage 3, GFR 30-59 ml/min 08/13/2012  . Glaucoma associated with systemic syndromes(365.44) 08/07/2012  . Hypothyroidism 04/16/2006  . Anemia 04/16/2006  . Depression 04/16/2006  . LOW BACK PAIN 04/16/2006  . BREAST CANCER, HX OF 04/16/2006  . Hyperlipidemia 10/13/2003  . Type 2 diabetes mellitus with diabetic polyneuropathy, with long-term current use of insulin ([place]) 04/16/1991    Orientation RESPIRATION BLADDER Height & Weight     Self, Place  Normal Incontinent Weight: 55.2 kg Height:  5\' 6"  (167.6 cm)  BEHAVIORAL SYMPTOMS/MOOD NEUROLOGICAL BOWEL NUTRITION STATUS    Convulsions/Seizures(Keppra 500 mg PO BID) Continent Diet(heart healthy/ carb modified)  AMBULATORY STATUS COMMUNICATION OF NEEDS Skin   Extensive Assist  Verbally Skin abrasions(scratch to left chest)                       Personal Care Assistance Level of Assistance  Bathing, Feeding, Dressing Bathing Assistance: Maximum assistance Feeding assistance: Limited assistance Dressing Assistance: Maximum assistance     Functional Limitations Info  Sight, Speech, Hearing Sight Info: Adequate Hearing Info: Adequate Speech Info: Adequate    SPECIAL CARE FACTORS FREQUENCY  PT (By licensed PT), OT (By licensed OT)     PT Frequency: 5x/wk OT Frequency: 5x/wk            Contractures Contractures Info: Not present    Additional Factors Info  Code Status, Allergies, Insulin Sliding Scale Code Status Info: Full Allergies Info: gabapentin/ meclizine/ penicillin/ sulfa antibiotics   Insulin Sliding Scale Info: Novolog 0-9 units SQ every 4 hours/ Lantus 8 units SQ daily       Current Medications (04/19/2019):  This is the current hospital active medication list Current Facility-Administered Medications  Medication Dose Route Frequency Provider Last Rate Last Admin  . enoxaparin (LOVENOX) injection 40 mg  40 mg Subcutaneous Q24H Kathi Ludwig, MD   40 mg at 04/18/19 2018  . insulin aspart (novoLOG) injection 0-9 Units  0-9 Units Subcutaneous Q4H Kathi Ludwig, MD   1 Units at 04/19/19 0900  . insulin glargine (LANTUS) injection 8 Units  8 Units Subcutaneous QHS Kathi Ludwig, MD   8 Units at 04/18/19 2018  . levETIRAcetam (KEPPRA) tablet 500 mg  500 mg Oral BID Kathi Ludwig, MD   500 mg at 04/19/19 0900  . levothyroxine (SYNTHROID) tablet 100  mcg  100 mcg Oral Q0600 Kathi Ludwig, MD   100 mcg at 04/19/19 0554  . polyethylene glycol 0.4% and propylene glycol 0.3% (SYSTANE) ophthalmic gel  1 application Both Eyes Daily PRN Agyei, Obed K, MD      . rosuvastatin (CRESTOR) tablet 20 mg  20 mg Oral Daily Agyei, Obed K, MD   20 mg at 04/19/19 0900     Discharge Medications: Please see discharge summary for  a list of discharge medications.  Relevant Imaging Results:  Relevant Lab Results:   Additional Information SS#: [id number]  Pollie Friar, RN

## 2019-04-19 NOTE — Progress Notes (Addendum)
Inpatient Diabetes Program Recommendations  AACE/ADA: New Consensus Statement on Inpatient Glycemic Control (2015)  Target Ranges:  Prepandial:   less than 140 mg/dL      Peak postprandial:   less than 180 mg/dL (1-2 hours)      Critically ill patients:  140 - 180 mg/dL   Lab Results  Component Value Date   GLUCAP 142 (H) 04/19/2019   HGBA1C 14.4 (H) 04/18/2019    Review of Glycemic Control Results for KASHARA, HAMMAN (MRN [id number]) as of 04/19/2019 12:35  Ref. Range 04/18/2019 18:32 04/18/2019 19:58 04/18/2019 23:43 04/19/2019 03:39 04/19/2019 08:03  Glucose-Capillary Latest Ref Range: 70 - 99 mg/dL 174 (H) 244 (H) 271 (H) 209 (H) 142 (H)   Diabetes history: DM2 Outpatient Diabetes medications: Humalog 5-7 units tid meal coverage +Tresiba 16 units daily + Metformin 1 gm bid + Ozempic 0.5 mg q q week Current orders for Inpatient glycemic control: Lantus 8 units qd + Novolog sensitive correction q 4 hrs.  Inpatient Diabetes Program Recommendations:   Add Novolog 2 units tid meal coverage if eats 50%  2:30 pm Spoke with patient @ bedside concerning her diabetes management. Patient unable to review what her blood sugars have been doing @ home and what she has been taking. Reviewed long acting compared to short acting but patient unable to verbalize understanding. Patient states her daughter is available to help her but doesn't ask due to daughter having a prosthetic leg and is also on insulin, and thought too much for her daughter to care for both of their insulins.  Recommend if patient Is discharged home, will need someone to administer medications including insulin until patient is able to verbalize and demonstrate that she is capable of administering insulin and checking her CBGs.  Thank you, Nani Gasser. Nalu Troublefield, RN, MSN, CDE  Diabetes Coordinator Inpatient Glycemic Control Team Team Pager [phone] (8am-5pm) 04/19/2019 12:42 PM

## 2019-04-19 NOTE — Procedures (Addendum)
Patient Name: [name]  MRN: [id number]  Epilepsy Attending: Lora Havens  Referring Physician/Provider: Dr. Kathi Ludwig Date: 04/19/2019 Duration: 23.39 minutes  Patient history: 69 year old female with new onset seizures in the setting of hypoglycemia.  EEG to evaluate for seizures.  Level of alertness: Awake  AEDs during EEG study: Keppra  Technical aspects: This EEG study was done with scalp electrodes positioned according to the 10-20 International system of electrode placement. Electrical activity was acquired at a sampling rate of 500Hz  and reviewed with a high frequency filter of 70Hz  and a low frequency filter of 1Hz . EEG data were recorded continuously and digitally stored.   Description: During awake state, no clear posterior dominant rhythm was seen.  EEG showed continuous generalized 6 to 7 Hz theta slowing as well as intermittent generalized 2 to 3 Hz delta slowing.  Hyperventilation and photic stimulation were not performed.  Abnormality -Continuous slow, generalized  IMPRESSION: This study is suggestive of moderate diffuse encephalopathy, nonspecific to etiology.  No seizures or epileptiform discharges were seen throughout the recording.  [name] [name]

## 2019-04-20 ENCOUNTER — Encounter: Payer: Medicare Other | Admitting: Internal Medicine

## 2019-04-20 LAB — GLUCOSE, CAPILLARY
Glucose-Capillary: 114 mg/dL — ABNORMAL HIGH (ref 70–99)
Glucose-Capillary: 156 mg/dL — ABNORMAL HIGH (ref 70–99)
Glucose-Capillary: 193 mg/dL — ABNORMAL HIGH (ref 70–99)
Glucose-Capillary: 268 mg/dL — ABNORMAL HIGH (ref 70–99)
Glucose-Capillary: 293 mg/dL — ABNORMAL HIGH (ref 70–99)
Glucose-Capillary: 344 mg/dL — ABNORMAL HIGH (ref 70–99)
Glucose-Capillary: 367 mg/dL — ABNORMAL HIGH (ref 70–99)

## 2019-04-20 LAB — SARS CORONAVIRUS 2 (TAT 6-24 HRS): SARS Coronavirus 2: NEGATIVE

## 2019-04-20 NOTE — Discharge Summary (Addendum)
Name: [name] MRN: [id number] DOB: [birth year] 69 y.o. PCP: Aldine Contes, MD  Date of Admission: 04/17/2019  6:07 PM Date of Discharge: 04/21/2019 Attending Physician: Axel Filler, *   Discharge Diagnosis: 1. Hyperglycemia induced focal myotonic seizures 2. Non-ketotic hyperglycemia   Discharge Medications: Allergies as of 04/21/2019      Reactions   Gabapentin Other (See Comments)   Dizziness from 300 mg, tolerates 100 mg Dizziness from 300 mg, tolerates 100 mg   Meclizine Hcl Rash   Penicillins Rash   Has patient had a PCN reaction causing immediate rash, facial/tongue/throat swelling, SOB or lightheadedness with hypotension: Yes Has patient had a PCN reaction causing severe rash involving mucus membranes or skin necrosis: No Has patient had a PCN reaction that required hospitalization: pt was in hospital at time of reaction Has patient had a PCN reaction occurring within the last 10 years: No If all of the above answers are "NO", then may proceed with Cephalosporin use.   Sulfa Antibiotics Rash      Medication List    STOP taking these medications   aspirin EC 81 MG tablet   Dexcom G6 Receiver Devi   Dexcom G6 Sensor Misc   Dexcom G6 Transmitter Misc     TAKE these medications   Acetaminophen 325 MG Caps Take 325 mg by mouth every 6 (six) hours as needed (for pain).   Alphagan P 0.1 % Soln Generic drug: brimonidine Place 1 drop into both eyes 3 (three) times daily.   BD Pen Needle Nano U/F 32G X 4 MM Misc Generic drug: Insulin Pen Needle USE AS DIRECTED   Besivance 0.6 % Susp Generic drug: Besifloxacin HCl Besivance 0.6 % eye drops,suspension  INSTILL 1 DROP INTO THE LEFT EYE QID X 2 DAYS AFTER EACH MONTHLY EYE INJECTION   busPIRone 5 MG tablet Commonly known as: BUSPAR Take 1 tablet (5 mg total) by mouth 2 (two) times daily.   calcium citrate-vitamin D 315-200 MG-UNIT tablet Commonly known as: CITRACAL+D Take 1 tablet by mouth  2 (two) times daily.   Ciloxan 0.3 % ophthalmic ointment Generic drug: ciprofloxacin   erythromycin ophthalmic ointment APPLY A SMALL AMOUNT INTO LEFT EYE TID   Glucerna Snack Shake Liqd Take 1 Dose by mouth 3 (three) times daily as needed.   glucose 4 GM chewable tablet Chew 4 tablets (16 g total) by mouth as needed for low blood sugar.   insulin detemir 100 UNIT/ML injection Commonly known as: LEVEMIR Inject 60 Units into the skin every morning.   insulin lispro 100 UNIT/ML KwikPen Commonly known as: HumaLOG KwikPen Inject 0.08-0.1 mLs (8-10 Units total) into the skin 3 (three) times daily. What changed: how much to take   ketorolac 0.5 % ophthalmic solution Commonly known as: ACULAR Place 1 drop into both eyes 3 (three) times daily.   levETIRAcetam 500 MG tablet Commonly known as: KEPPRA Take 1 tablet (500 mg total) by mouth 2 (two) times daily for 3 days.   levothyroxine 100 MCG tablet Commonly known as: SYNTHROID TAKE 1 TABLET BY MOUTH EVERY DAY What changed: when to take this   melatonin 1 MG Tabs tablet Take 1 tablet (1 mg total) by mouth daily.   metFORMIN 1000 MG tablet Commonly known as: GLUCOPHAGE Take 1 tablet (1,000 mg total) by mouth 2 (two) times daily with a meal.   OneTouch Delica Lancets 34V Misc Use to check blood sugar before meals and at bedtime   OneTouch Verio test strip Generic  drug: glucose blood CHECK BLOOD SUGAR before meals and bedtime   OneTouch Verio w/Device Kit Check blood sugar up to 5 times a day   Ozempic (0.25 or 0.5 MG/DOSE) 2 MG/1.5ML Sopn Generic drug: Semaglutide(0.25 or 0.5MG/DOS) Inject 0.5 mg into the skin once a week.   Pataday 0.2 % Soln Generic drug: Olopatadine HCl Place 1 drop into both eyes daily as needed (dry eyes).   prednisoLONE acetate 1 % ophthalmic suspension Commonly known as: PRED FORTE   Restasis 0.05 % ophthalmic emulsion Generic drug: cycloSPORINE Restasis MultiDose 0.05 % eye drops  INT 1  GTT IN EACH EYE BID   rosuvastatin 20 MG tablet Commonly known as: CRESTOR TAKE 1 TABLET BY MOUTH EVERY DAY What changed:   how much to take  how to take this  when to take this   Systane 0.4-0.3 % Gel ophthalmic gel Generic drug: Polyethyl Glycol-Propyl Glycol Place 1 application daily as needed into both eyes (dry eyes/irritation).   Tyler Aas FlexTouch 100 UNIT/ML FlexTouch Pen Generic drug: insulin degludec Inject 0.16 mLs (16 Units total) into the skin at bedtime.   valACYclovir 500 MG tablet Commonly known as: VALTREX TK 1 T PO QD   venlafaxine XR 150 MG 24 hr capsule Commonly known as: EFFEXOR-XR TAKE 1 CAPSULE BY MOUTH DAILY WITH BREAKFAST What changed:   how much to take  how to take this  when to take this  additional instructions   Vitamin D3 50 MCG (2000 UT) Tabs Take 2,000 Units at bedtime by mouth.      Disposition and follow-up:   [name] was discharged from [facility] in Good condition.  At the hospital follow up visit please address:  1.  Follow-up: 1) Seizures - make sure to finish Keppra, 2) DM - needs close follow up with DM. 3) Thyroid - repeat TSH/T4  2.  Labs / imaging needed at time of follow-up: BMP, A1c, TSH, T4  3.  Pending labs/ test needing follow-up: none  Follow-up Appointments:  Contact information for follow-up providers    Aldine Contes, MD Follow up.   Specialty: Internal Medicine Why: The clinic will call you to make an appointment. Contact information: [place], [address] [place] [address] [phone]            Contact information for after-discharge care    Destination    [name] Preferred SNF .   Service: Skilled Nursing Contact information: [place] [phone]                 [facility] clinic will call you with a follow-up appointment time and date.  Hospital Course by problem  list: 1. Seizures [name] is a 69 year old female with a past medical history of type II diabetes, hypothyroidism, and CKD who presented to the ED due to seizure like activity. While in the ED she had 3 more episodes of seizure like activity and was not able to continue to communicate throughout these episodes, but she did immediately return to baseline after each episode. She was then loaded with 1000 mg Keppra. CT head and CXR were negative. Neurology was consulted.  She had head MRI overnight which showed possible diffusion abnormality in left thalamus, but this was believed to more likely be an artifact given the degree of motion degredation.  EEG was preformed on 03/22 which was also unremarkable for seizure or epileptiform discharges.  No further  seizure-like activity occurred after leaving the ED and blood glucose level was brought down. Patient's last dose of keppra will be  04/24/2019  2. Hyperglycemia On presentation patient's blood glucose was in 820s. Beta hydroxybutyrate was within normal limits. She was given 1 L IV fluids in the ED. She was then started on lantus 8 U nightly, sliding scale insulin, and 1 time dose of novolog 15 units. Her blood glucose level came down to 100s-200s by the morning after admission, and has remained in this range since. On 03/22 2 U of novolog was added with meals with more than 50% eaten.   3. Encephalopathy  The patient was slow to move and to communicate the day after admission (03/21). EEG on 03/22 was suggestive of diffuse encephalopathy. Clinically her mentation has continued to improve over the course of her hospitalization and she is now back to her baseline.   Discharge Vitals:   BP 116/67 (BP Location: Left Arm)   Pulse 85   Temp 98.2 F (36.8 C) (Oral)   Resp 15   Ht 5' 6" (1.676 m)   Wt 55.2 kg   SpO2 99%   BMI 19.64 kg/m   Pertinent Labs, Studies, and Procedures:  CBC Latest Ref Rng & Units 04/19/2019 04/17/2019 04/17/2019  WBC  4.0 - 10.5 K/uL 10.2 7.4 -  Hemoglobin 12.0 - 15.0 g/dL 13.0 14.4 13.3  Hematocrit 36.0 - 46.0 % 38.2 42.7 39.0  Platelets 150 - 400 K/uL 263 263 -    CMP Latest Ref Rng & Units 04/19/2019 04/18/2019 04/17/2019  Glucose 70 - 99 mg/dL 90 184(H) 820(HH)  BUN 8 - 23 mg/dL _0 Creatinine 0.44 - 1.00 mg/dL 0.90 0.98 1.25(H)  Sodium 135 - 145 mmol/L 142 140 129(L)  Potassium 3.5 - 5.1 mmol/L 4.0 3.3(L) 5.0  Chloride 98 - 111 mmol/L 107 103 93(L)  CO2 22 - 32 mmol/L 25 23 20(L)  Calcium 8.9 - 10.3 mg/dL 9.5 9.8 9.4  Total Protein 6.5 - 8.1 g/dL 6.1(L) - 7.9  Total Bilirubin 0.3 - 1.2 mg/dL 0.6 - 0.7  Alkaline Phos 38 - 126 U/L 64 - 111  AST 15 - 41 U/L 19 - 24  ALT 0 - 44 U/L 13 - 18    Head CT without contrast: No acute intracranial pathology   Brain MRI without contrast: 1. Technically limited exam due to extensive motion artifact and patient's inability to tolerate the full length study. 2. Questionable 4 mm focus of diffusion abnormality at the inferior left thalamus/mesial left temporal lobe. Artifact is favored on this motion degraded exam. Possible small acute ischemic infarct not entirely excluded, and could considered in the correct clinical setting. 3. Otherwise stable and negative brain MRI for age.  Discharge Instructions: Discharge Instructions    Diet - low sodium heart healthy   Complete by: As directed    Discharge instructions   Complete by: As directed    You were hospitalized for seizure. Thank you for allowing [place] to be part of your care.   We arranged for you to follow up at: [facility] health internal medicine - they will call you to make an appointment.    Please note these changes made to your medications:   Please START taking:  1. Keppra 500 mg twice daily - last day on 04/24/2019  Please STOP taking:  1. ASA 87m  Otherwise, continue all of your other home medications.  Please make sure to follow up with your PCP  Please call our clinic if you  have any questions or concerns, we may be able to help and keep you from a long and expensive emergency room wait. Our clinic and after hours phone number is [phone], the best time to call is Monday through Friday 9 am to 4 pm but there is always someone available 24/7 if you have an emergency. If you need medication refills please notify your pharmacy one week in advance and they will send [place] a request.   Face-to-face encounter (required for Medicare/Medicaid patients)   Complete by: As directed    I Obed K Agyei certify that this patient is under my care and that I, or a nurse practitioner or physician's assistant working with me, had a face-to-face encounter that meets the physician face-to-face encounter requirements with this patient on 04/19/2019. The encounter with the patient was in whole, or in part for the following medical condition(s) which is the primary reason for home health care (List medical condition): Seizure   The encounter with the patient was in whole, or in part, for the following medical condition, which is the primary reason for home health care: Seizure   I certify that, based on my findings, the following services are medically necessary home health services: Nursing   Reason for Medically Necessary Home Health Services: Skilled Nursing- Skilled Assessment/Observation   My clinical findings support the need for the above services: OTHER SEE COMMENTS   Further, I certify that my clinical findings support that this patient is homebound due to: Unsafe ambulation due to balance issues   Home Health   Complete by: As directed    To provide the following care/treatments: OT   Increase activity slowly   Complete by: As directed       Signed: [name], MD 04/21/2019, 12:36 PM   Pager: _0 @

## 2019-04-20 NOTE — Progress Notes (Signed)
Physical Therapy Treatment Patient Details Name: [name] MRN: [id number] DOB: 1950/06/19 Today's Date: 04/20/2019    History of Present Illness  female w/ PMH significant for seizures, T2DM, hypothyroidism, HTN, HLD, CKD, and breast cancer s/p chemo and R mastectomy who presented to the ED after reportedly having seizure-like activity.    PT Comments    Pt admitted with above diagnosis. Pt was able to stand and pivot to the 3n1 and then to the recliner with min assist and bil UE support.  Progressing but needs continued therapy.  Pt currently with functional limitations due to balance and endurance deficits. Pt will benefit from skilled PT to increase their independence and safety with mobility to allow discharge to the venue listed below.     Follow Up Recommendations  SNF;Supervision/Assistance - 24 hour     Equipment Recommendations  (TBD at next venue)    Recommendations for Other Services       Precautions / Restrictions Precautions Precautions: Fall Precaution Comments: seizures Restrictions Weight Bearing Restrictions: No    Mobility  Bed Mobility Overal bed mobility: Needs Assistance Bed Mobility: Supine to Sit;Sit to Supine     Supine to sit: Min assist     General bed mobility comments: A little assist to initiate movement  Transfers Overall transfer level: Needs assistance Equipment used: 2 person hand held assist Transfers: Sit to/from [facility] Sit to Stand: Min assist Stand pivot transfers: Min assist       General transfer comment: Pt needed min assist to be steadied and min assist to stand pivot to chair. Pt took steps to 3N1 and steadied self with bil UE support.  Then pt pivoted to recliner once she cleaned herself. Needs UE suport and weakness in bil LEs during transfer.   Ambulation/Gait             General Gait Details: deferred   Stairs             Wheelchair Mobility    Modified Rankin (Stroke  Patients Only)       Balance Overall balance assessment: Needs assistance Sitting-balance support: Feet unsupported;No upper extremity supported Sitting balance-Leahy Scale: Fair Sitting balance - Comments: could sit EOB without assisst.    Standing balance support: Bilateral upper extremity supported;During functional activity Standing balance-Leahy Scale: Poor Standing balance comment: relies on UE support                            Cognition Arousal/Alertness: Awake/alert Behavior During Therapy: Flat affect Overall Cognitive Status: Impaired/Different from baseline Area of Impairment: Orientation;Following commands;Problem solving                 Orientation Level: Disoriented to;Time;Place;Situation     Following Commands: Follows one step commands with increased time;Follows one step commands consistently;Follows multi-step commands inconsistently     Problem Solving: Slow processing;Decreased initiation;Difficulty sequencing;Requires verbal cues;Requires tactile cues        Exercises General Exercises - Lower Extremity Ankle Circles/Pumps: AROM;Both;10 reps;Supine Long Arc Quad: AROM;Both;10 reps;Seated    General Comments General comments (skin integrity, edema, etc.): VSS      Pertinent Vitals/Pain Faces Pain Scale: No hurt    Home Living                      Prior Function            PT Goals (current goals can now be found in  the care plan section) Progress towards PT goals: Progressing toward goals    Frequency    Min 3X/week      PT Plan Current plan remains appropriate    Co-evaluation              AM-PAC PT "6 Clicks" Mobility   Outcome Measure  Help needed turning from your back to your side while in a flat bed without using bedrails?: A Lot Help needed moving from lying on your back to sitting on the side of a flat bed without using bedrails?: A Lot Help needed moving to and from a bed to a chair  (including a wheelchair)?: A Lot Help needed standing up from a chair using your arms (e.g., wheelchair or bedside chair)?: A Lot Help needed to walk in hospital room?: Total Help needed climbing 3-5 steps with a railing? : Total 6 Click Score: 10    End of Session Equipment Utilized During Treatment: Gait belt Activity Tolerance: Patient tolerated treatment well Patient left: with call bell/phone within reach;in chair;with chair alarm set Nurse Communication: Mobility status PT Visit Diagnosis: Unsteadiness on feet (R26.81);Muscle weakness (generalized) (M62.81);Difficulty in walking, not elsewhere classified (R26.2)     Time: [id number] PT Time Calculation (min) (ACUTE ONLY): 17 min  Charges:  $Therapeutic Activity: 8-22 mins                     Isabel Ardila W,PT Acute Rehabilitation Services Pager:  [phone]  Office:  [place] 04/20/2019, 2:28 PM

## 2019-04-20 NOTE — TOC Progression Note (Signed)
Transition of Care [facility]) - Progression Note    Patient Details  Name: [name] MRN: [id number] Date of Birth: 08/17/1950  [facility] CM/SW [place], RN Phone Number: 04/20/2019, 4:25 PM  Clinical Narrative:   Notified by Ronney Lion that patient has auto listed as primary insurance. Number provided to the patient and son [name] per patients request. [name] has called and states that claim has been removed from insurance. [name] liaison has been made aware and will follow up to determine if it is ok to proceed with SNF placement. MD to be updated. Will continue to follow.         Expected Discharge Plan and Services                                                 Social Determinants of Health (SDOH) Interventions    Readmission Risk Interventions No flowsheet data found.

## 2019-04-20 NOTE — Progress Notes (Signed)
Patient ID: [name], female   DOB: 07-20-1950, 69 y.o.   MRN: [id number]   Subjective: HD: 3  Overnight events: tachypnic with RR 24 early this AM, has since resolved  Angus Palms was seen on rounds this morning and was sitting up in her chair washing herself with a wet soapy rag. She was smiling and conversed easily throughout the encounter. She reports that she is feeling better today, but complains of her generalized weakness of her right arm and leg. She has been able to move from the bed to the chair in her room. She reports that her constipation from yesterday has resolved and she was able to have a bowel movement that relieved her discomfort. She has not experienced any further seizure like activity or any pain.  We discussed her discharge to SNF today and she agreed to the plan and di not have any questions or concerns other than wanting a COVID shot. She was told that she would be able to receive this at the SNF and that it was not something we would be able to administer. She understood this and was okay with this plan.   Objective:  Vital signs in last 24 hours: Vitals:   04/19/19 2357 04/20/19 0423 04/20/19 0736 04/20/19 0833  BP: 127/65 127/72 136/75 136/74  Pulse: 99 80 76 75  Resp: 11 (!) 24 (!) 7 15  Temp: 99.3 F (37.4 C) 98.3 F (36.8 C) 97.9 F (36.6 C) 98 F (36.7 C)  TempSrc: Oral Oral Oral Oral  SpO2: 100% 100% 100% 100%  Weight:      Height:       Weight change:   Intake/Output Summary (Last 24 hours) at 04/20/2019 1117 Last data filed at 04/20/2019 1051 Gross per 24 hour  Intake 240 ml  Output 1200 ml  Net -960 ml   Physical Exam Cardiovascular:     Rate and Rhythm: Normal rate and regular rhythm.     Heart sounds: Normal heart sounds.  Pulmonary:     Effort: Pulmonary effort is normal.     Breath sounds: Normal breath sounds.  Abdominal:     General: Abdomen is flat.     Palpations: Abdomen is soft.  Skin:    General: Skin is warm and dry.   Neurological:     Mental Status: She is alert.     Motor: No weakness.  Psychiatric:        Mood and Affect: Mood normal.        Speech: Speech normal.    CBG: 04:22 03/23  156 00:03 03/23  293  03/22  227  Assessment/Plan:  Active Problems:   Hyperglycemia   Seizure-like activity ([place])  Summary:  Valleri Rimel is a 69 year old female with a past medical history of type II DM, hypothyroidism, and CKD who was admitted for provoked seizures believed to most likely be due to hyperglycemia (blood glucose in 820s on presentation) on HD 3.   Seizure like activity: The patient has had no further seizure like activity since beginning keppra and getting blood glucose level more under control. Neurology agrees that this was likely a provoked seizure. Given high blood glucose level and characterization of focal myotonic seizure activity this is consistent with hyperglycemic non ketotic induced seizures.  - continue keppra for total 1 week (end date 03/27)  Encephalopathy:  Encephalopathy continues to improve and the patient is alert and communicative today. Given her continued administration of keppra with improvement it is  likely that this was related primarily to a post ictal state from her seizures. She has a high risk of depression which could also be a contributing factor to this, but her mood has seemed to improve and was smiling and in good spirits today.   Hyperglycemia, DM II: Has been improving since admission with insulin therapy. Patient admits to having difficulty remembering to rake mediations though and adherence to medication regimen will likely be issue as outpatient without assistance.  - continue lantus 8U nightly - continue sliding scale insulin - novology 2 U TID added if eats more than 50%  HTN: Patient does not take at home antihypertensives. Blood pressure has now normalized and is 120s/70s.  - monitor vital signs  AKI: Creatinine 1.25 on presentation, now  normalized to 0.90 as of yesterday.   Hypothyroidism: Free T4 levels are normal. Elevated TSH. History of hypothyroidism.  - continue levothyroxine    VTE ppx: lovenox 40 mg Diet:heart healthy/carb modified PT recs: 5x/wk OT recs: 5x/wk Dispo: to SNF today      LOS: 3 days   Mikael Spray, Medical Student 04/20/2019, 11:17 AM

## 2019-04-20 NOTE — Plan of Care (Signed)

## 2019-04-20 NOTE — Progress Notes (Addendum)
First EKG completed at 2048 and transmitted. Received transmit confirmation on EKG machine.   Second EKG completed at 2342 and transmitted. Received transmit confirmation on EKG machine.  Spoke to on call [employer] and offered twice (phone & page) to tube physical copies if not received.   Extra copies made and placed in patients chart to be made available should provider need them.     04/21/19 0003: IMTS has verbally confirmed that EKG's are not transmitting to the provider side. IMTS picked up copies in person. Technological issue has been escalated to CN.   04/21/2019 0606: IMTS paged re: pt has requested an RX for a headache. Pt during this shift has been intermittently confused. Pt tried to get up out of bed to get ready to go home, thinking that she was discharged.

## 2019-04-21 DIAGNOSIS — E118 Type 2 diabetes mellitus with unspecified complications: Secondary | ICD-10-CM | POA: Diagnosis not present

## 2019-04-21 DIAGNOSIS — Z881 Allergy status to other antibiotic agents status: Secondary | ICD-10-CM

## 2019-04-21 DIAGNOSIS — G9340 Encephalopathy, unspecified: Secondary | ICD-10-CM

## 2019-04-21 DIAGNOSIS — R1312 Dysphagia, oropharyngeal phase: Secondary | ICD-10-CM | POA: Diagnosis not present

## 2019-04-21 DIAGNOSIS — G40409 Other generalized epilepsy and epileptic syndromes, not intractable, without status epilepticus: Secondary | ICD-10-CM

## 2019-04-21 DIAGNOSIS — E1122 Type 2 diabetes mellitus with diabetic chronic kidney disease: Secondary | ICD-10-CM | POA: Diagnosis not present

## 2019-04-21 DIAGNOSIS — R262 Difficulty in walking, not elsewhere classified: Secondary | ICD-10-CM | POA: Diagnosis not present

## 2019-04-21 DIAGNOSIS — I10 Essential (primary) hypertension: Secondary | ICD-10-CM | POA: Diagnosis not present

## 2019-04-21 DIAGNOSIS — Z794 Long term (current) use of insulin: Secondary | ICD-10-CM | POA: Diagnosis not present

## 2019-04-21 DIAGNOSIS — Z789 Other specified health status: Secondary | ICD-10-CM | POA: Diagnosis not present

## 2019-04-21 DIAGNOSIS — E162 Hypoglycemia, unspecified: Secondary | ICD-10-CM | POA: Diagnosis not present

## 2019-04-21 DIAGNOSIS — H4089 Other specified glaucoma: Secondary | ICD-10-CM | POA: Diagnosis not present

## 2019-04-21 DIAGNOSIS — E161 Other hypoglycemia: Secondary | ICD-10-CM | POA: Diagnosis not present

## 2019-04-21 DIAGNOSIS — N183 Chronic kidney disease, stage 3 unspecified: Secondary | ICD-10-CM | POA: Diagnosis not present

## 2019-04-21 DIAGNOSIS — E1142 Type 2 diabetes mellitus with diabetic polyneuropathy: Secondary | ICD-10-CM | POA: Diagnosis not present

## 2019-04-21 DIAGNOSIS — R2681 Unsteadiness on feet: Secondary | ICD-10-CM | POA: Diagnosis not present

## 2019-04-21 DIAGNOSIS — I129 Hypertensive chronic kidney disease with stage 1 through stage 4 chronic kidney disease, or unspecified chronic kidney disease: Secondary | ICD-10-CM | POA: Diagnosis not present

## 2019-04-21 DIAGNOSIS — H0102A Squamous blepharitis right eye, upper and lower eyelids: Secondary | ICD-10-CM | POA: Diagnosis not present

## 2019-04-21 DIAGNOSIS — G9349 Other encephalopathy: Secondary | ICD-10-CM | POA: Diagnosis not present

## 2019-04-21 DIAGNOSIS — E11311 Type 2 diabetes mellitus with unspecified diabetic retinopathy with macular edema: Secondary | ICD-10-CM | POA: Diagnosis not present

## 2019-04-21 DIAGNOSIS — R2689 Other abnormalities of gait and mobility: Secondary | ICD-10-CM | POA: Diagnosis not present

## 2019-04-21 DIAGNOSIS — E1165 Type 2 diabetes mellitus with hyperglycemia: Secondary | ICD-10-CM | POA: Diagnosis not present

## 2019-04-21 DIAGNOSIS — H16223 Keratoconjunctivitis sicca, not specified as Sjogren's, bilateral: Secondary | ICD-10-CM | POA: Diagnosis not present

## 2019-04-21 DIAGNOSIS — M6281 Muscle weakness (generalized): Secondary | ICD-10-CM | POA: Diagnosis not present

## 2019-04-21 DIAGNOSIS — Z888 Allergy status to other drugs, medicaments and biological substances status: Secondary | ICD-10-CM

## 2019-04-21 DIAGNOSIS — R278 Other lack of coordination: Secondary | ICD-10-CM | POA: Diagnosis not present

## 2019-04-21 DIAGNOSIS — M2550 Pain in unspecified joint: Secondary | ICD-10-CM | POA: Diagnosis not present

## 2019-04-21 DIAGNOSIS — Z880 Allergy status to penicillin: Secondary | ICD-10-CM

## 2019-04-21 DIAGNOSIS — H401131 Primary open-angle glaucoma, bilateral, mild stage: Secondary | ICD-10-CM | POA: Diagnosis not present

## 2019-04-21 DIAGNOSIS — N178 Other acute kidney failure: Secondary | ICD-10-CM | POA: Diagnosis not present

## 2019-04-21 DIAGNOSIS — H1712 Central corneal opacity, left eye: Secondary | ICD-10-CM | POA: Diagnosis not present

## 2019-04-21 DIAGNOSIS — Z7401 Bed confinement status: Secondary | ICD-10-CM | POA: Diagnosis not present

## 2019-04-21 DIAGNOSIS — R569 Unspecified convulsions: Secondary | ICD-10-CM | POA: Diagnosis not present

## 2019-04-21 DIAGNOSIS — Z9181 History of falling: Secondary | ICD-10-CM | POA: Diagnosis not present

## 2019-04-21 LAB — GLUCOSE, CAPILLARY
Glucose-Capillary: 95 mg/dL (ref 70–99)
Glucose-Capillary: 148 mg/dL — ABNORMAL HIGH (ref 70–99)
Glucose-Capillary: 300 mg/dL — ABNORMAL HIGH (ref 70–99)
Glucose-Capillary: 305 mg/dL — ABNORMAL HIGH (ref 70–99)
Glucose-Capillary: 266 mg/dL — ABNORMAL HIGH (ref 70–99)

## 2019-04-21 MED ORDER — LEVETIRACETAM 500 MG PO TABS: 500.0000 mg | ORAL_TABLET | Freq: Two times a day (BID) | ORAL | 0 refills | Status: DC

## 2019-04-21 MED ORDER — ACETAMINOPHEN 325 MG PO TABS: 325.0000 mg | ORAL_TABLET | Freq: Once | ORAL | Status: DC

## 2019-04-21 NOTE — TOC Transition Note (Addendum)
Transition of Care [facility]) - CM/SW Discharge Note   Patient Details  Name: [name] MRN: [id number] Date of Birth: 03-12-1950  Transition of Care [facility]) CM/SW Contact:  Angelita Ingles, RN Phone Number: 04/21/2019, 1:46 PM   Clinical Narrative: Patient updated about plan to discharge to [facility] today. Patient requested that son [name] be notified and made aware. [name] has been notified. [name] health liaison has requested that family provide ozempic. Son has been made aware and he will call facility to set up a time that he can deliver this medication. Discharge packet has been placed at the nurses station. Bedside nurse updated.  Report to be called to: [place] # [phone]     Final next level of care: Skilled Nursing Facility([name]) Barriers to Discharge: No Barriers Identified   Patient Goals and CMS Choice        Discharge Placement   Existing PASRR number confirmed : 04/21/19          Patient [facility] bed at: [facility] Patient to be transferred to facility by: [facility] Name of family member notified: [name] Patient and family notified of of transfer: 04/21/19  Discharge Plan and Services                                     Social Determinants of Health (SDOH) Interventions     Readmission Risk Interventions No flowsheet data found.

## 2019-04-21 NOTE — Plan of Care (Signed)

## 2019-04-21 NOTE — Progress Notes (Signed)
Patient ID: [name], female   DOB: Apr 29, 1950, 69 y.o.   MRN: [id number]   Subjective: HD: 3   Overnight: Nurse alerted [place] to ST elevations on telemetry; 2 EKGS were done which showed no ST segment elevations and the patient was asymptomatic.   [name] was seen this morning on rounds and was resting comfortably in her bed. She says that her right arm still feels a bit weak, but no worse than yesterday. She also has a headache that began this morning. Otherwise though she reports feeling well and has no further seizure activity. She has not head any abdominal pain, chest pain, or difficulty breathing.  We discussed her discharge to SNF today and she agrees to this plan and has no questions or concerns at this time.   Objective:  Vital signs in last 24 hours: Vitals:   04/21/19 0002 04/21/19 0500 04/21/19 0600 04/21/19 0801  BP: 114/71 132/63 138/61 (!) 152/79  Pulse: 80 82 81 80  Resp: 14 16 13 15   Temp:    98.1 F (36.7 C)  TempSrc:    Oral  SpO2: 100% 100% 100% 100%  Weight:      Height:       Weight change:   Intake/Output Summary (Last 24 hours) at 04/21/2019 1032 Last data filed at 04/21/2019 1001 Gross per 24 hour  Intake 120 ml  Output 850 ml  Net -730 ml   Physical Exam Cardiovascular:     Rate and Rhythm: Normal rate and regular rhythm.     Pulses: Normal pulses.     Heart sounds: Normal heart sounds.  Pulmonary:     Effort: Pulmonary effort is normal.     Breath sounds: Normal breath sounds.  Abdominal:     General: Abdomen is flat.     Palpations: Abdomen is soft.  Skin:    General: Skin is warm and dry.  Neurological:     Mental Status: She is alert. Mental status is at baseline.     Motor: No weakness or seizure activity.  Psychiatric:        Mood and Affect: Mood normal.    CBG: 07:59 03/24  148 03:27 03/24  95  03/23  193  EKGs: Normal sinus rhythm, no ST segment elevations, no other signs of ischemia   Assessment/Plan:  Active  Problems:   Hyperglycemia   Seizure-like activity ([place])  Summary:  [name] is a 69 year old female with a past medical history of type II diabetes, hypothyroidism, and CKD who was admitted for hyperglycemia induced focal myotonic seizures on hospital day 3.   Seizures: The patient has not had any more seizure activity since her blood sugar levels have been brought down. She does complain of feeling slightly weak in the arm that was affected during the previous episodes, but this is likely related to a post ictal state and has been improving over the past few days.  - 3 more days of Keppra for a total of 1 week (end date 03/27)  Encephalopathy:  The patient continues to be alert and communicative and seems to be back at base line. Her mood has seemed improved as well in comparison to admission. Previous encephalopathy likely due to a combination of post ictal state and distress from hospitalization and new seizure activity.   Hyperglycemia, DMII: The patient's hyperglycemia has been improving with insulin therapy and is now in 90s-100s for past 24 hours and is now much better controlled. Outpatient adherence will  likely be an issue without assistance as she admits to difficulty remembering to take her medications.   - continue lantus 8U nightly - continue sliding scale insulin - novolog 2 U TID added if eats more than 50%  HTN: Patient does not take at home antihypertensives. Blood pressure has now normalized and is 120s/70s.  - monitor vital signs  Hypothyroidism: Free T4 levels are normal. Elevated TSH.History of hypothyroidism. - continue levothyroxine   VTE ppx: lovenox 40 mg Diet:heart healthy/carb modified PT recs:5x/wk OT recs: 5x/wk Dispo:to SNF today     LOS: 4 days   Mikael Spray, Medical Student 04/21/2019, 10:32 AM

## 2019-04-21 NOTE — Progress Notes (Signed)
Physical Therapy Treatment Patient Details Name: [name] MRN: [id number] DOB: 08/29/1950 Today's Date: 04/21/2019    History of Present Illness  female w/ PMH significant for seizures, T2DM, hypothyroidism, HTN, HLD, CKD, and breast cancer s/p chemo and R mastectomy who presented to the ED after reportedly having seizure-like activity.    PT Comments    Pt is pleasant and resting on room air. Pt has been making steady progression with functional mobility. She is able to transfer supine<>sit with mod I today and able to take a few steps back and forth bed<>chair. Pt requires mod A to stand and has difficulty initiating and sequencing stepping when ambulating as well as transferring into chair. Increased time and cuing to complete. D/C plans remain appropriate. PT will continue to follow acutely.   Follow Up Recommendations  SNF;Supervision/Assistance - 24 hour     Equipment Recommendations  Other (comment)(TBD at next venue)       Precautions / Restrictions Precautions Precautions: Fall Precaution Comments: seizures Restrictions Weight Bearing Restrictions: No    Mobility  Bed Mobility Overal bed mobility: Modified Independent Bed Mobility: Supine to Sit;Sit to Supine     Supine to sit: Modified independent (Device/Increase time) Sit to supine: Modified independent (Device/Increase time)   General bed mobility comments: mod I for increased time and use of bed rial  Transfers Overall transfer level: Needs assistance Equipment used: Rolling walker (2 wheeled) Transfers: Sit to/from [facility] Sit to Stand: Mod assist Stand pivot transfers: Min assist       General transfer comment: Mod A to power up and steady herself at [facility].  Ambulation/Gait Ambulation/Gait assistance: Min assist Gait Distance (Feet): 4 Feet Assistive device: Rolling walker (2 wheeled) Gait Pattern/deviations: Decreased stride length;Step-through pattern;Shuffle;Narrow base of  support Gait velocity: decreased Gait velocity interpretation: 1.31 - 2.62 ft/sec, indicative of limited community ambulator General Gait Details: min A for steadying and helping weight shift              Balance Overall balance assessment: Needs assistance Sitting-balance support: Feet supported;No upper extremity supported Sitting balance-Leahy Scale: Fair Sitting balance - Comments: able to sit EOB without assist   Standing balance support: Bilateral upper extremity supported;During functional activity Standing balance-Leahy Scale: Poor Standing balance comment: relies on UE support                            Cognition Arousal/Alertness: Awake/alert Behavior During Therapy: Flat affect Overall Cognitive Status: Impaired/Different from baseline Area of Impairment: Orientation;Following commands;Problem solving                       Following Commands: Follows one step commands consistently;Follows multi-step commands consistently;Follows multi-step commands with increased time     Problem Solving: Slow processing;Difficulty sequencing;Requires verbal cues General Comments: Pt alert and oriented today.      Exercises Other Exercises Other Exercises: standing tolerance at RW for up to 10 minutes Other Exercises: bed<>chair transfer x 2 with 4' in between. Multimodal cuing for sequencing.    General Comments General comments (skin integrity, edema, etc.): Pt on room air. Vitals WNL throughout.      Pertinent Vitals/Pain Pain Assessment: Faces Faces Pain Scale: No hurt           PT Goals (current goals can now be found in the care plan section) Acute Rehab PT Goals Patient Stated Goal: none stated PT Goal Formulation: With patient Time For  Goal Achievement: 05/02/19 Potential to Achieve Goals: Fair Progress towards PT goals: Progressing toward goals    Frequency    Min 2X/week      PT Plan Frequency needs to be updated        AM-PAC PT "6 Clicks" Mobility   Outcome Measure  Help needed turning from your back to your side while in a flat bed without using bedrails?: A Little Help needed moving from lying on your back to sitting on the side of a flat bed without using bedrails?: A Little Help needed moving to and from a bed to a chair (including a wheelchair)?: A Little Help needed standing up from a chair using your arms (e.g., wheelchair or bedside chair)?: A Lot Help needed to walk in hospital room?: A Lot Help needed climbing 3-5 steps with a railing? : Total 6 Click Score: 14    End of Session Equipment Utilized During Treatment: Gait belt Activity Tolerance: Patient tolerated treatment well Patient left: in bed;with call bell/phone within reach;with nursing/sitter in room;with bed alarm set Nurse Communication: Mobility status PT Visit Diagnosis: Unsteadiness on feet (R26.81);Muscle weakness (generalized) (M62.81);Difficulty in walking, not elsewhere classified (R26.2)     Time: [id number] PT Time Calculation (min) (ACUTE ONLY): 28 min  Charges:  $Gait Training: 8-22 mins $Therapeutic Activity: 8-22 mins                     Jodelle Green, PT, DPT Acute Rehabilitation Services Office [phone]   Jodelle Green 04/21/2019, 4:21 PM

## 2019-04-21 NOTE — Final Progress Note (Signed)
PIV removed. AVS and report given to Lilly at [facility], all questions and concerns answered. Discharged to SNF. Pt awaiting transportation by [facility].

## 2019-04-22 DIAGNOSIS — E1165 Type 2 diabetes mellitus with hyperglycemia: Secondary | ICD-10-CM | POA: Diagnosis not present

## 2019-04-22 DIAGNOSIS — R262 Difficulty in walking, not elsewhere classified: Secondary | ICD-10-CM | POA: Diagnosis not present

## 2019-04-22 DIAGNOSIS — G9349 Other encephalopathy: Secondary | ICD-10-CM | POA: Diagnosis not present

## 2019-04-22 DIAGNOSIS — N178 Other acute kidney failure: Secondary | ICD-10-CM | POA: Diagnosis not present

## 2019-04-27 ENCOUNTER — Encounter: Payer: Medicare Other | Admitting: Internal Medicine

## 2019-04-27 DIAGNOSIS — E161 Other hypoglycemia: Secondary | ICD-10-CM | POA: Diagnosis not present

## 2019-04-27 DIAGNOSIS — E118 Type 2 diabetes mellitus with unspecified complications: Secondary | ICD-10-CM | POA: Diagnosis not present

## 2019-04-27 DIAGNOSIS — I129 Hypertensive chronic kidney disease with stage 1 through stage 4 chronic kidney disease, or unspecified chronic kidney disease: Secondary | ICD-10-CM | POA: Diagnosis not present

## 2019-04-29 DIAGNOSIS — E162 Hypoglycemia, unspecified: Secondary | ICD-10-CM | POA: Diagnosis not present

## 2019-04-29 DIAGNOSIS — E118 Type 2 diabetes mellitus with unspecified complications: Secondary | ICD-10-CM | POA: Diagnosis not present

## 2019-05-04 ENCOUNTER — Telehealth: Payer: Self-pay | Admitting: Internal Medicine

## 2019-05-04 NOTE — Telephone Encounter (Signed)
[facility] APPT 06/01/2019 @ 10:15 AM WITH DR Dareen Piano

## 2019-05-05 DIAGNOSIS — E118 Type 2 diabetes mellitus with unspecified complications: Secondary | ICD-10-CM | POA: Diagnosis not present

## 2019-05-05 DIAGNOSIS — Z789 Other specified health status: Secondary | ICD-10-CM | POA: Diagnosis not present

## 2019-05-05 DIAGNOSIS — E1165 Type 2 diabetes mellitus with hyperglycemia: Secondary | ICD-10-CM | POA: Diagnosis not present

## 2019-05-05 DIAGNOSIS — E162 Hypoglycemia, unspecified: Secondary | ICD-10-CM | POA: Diagnosis not present

## 2019-05-06 DIAGNOSIS — H1712 Central corneal opacity, left eye: Secondary | ICD-10-CM | POA: Diagnosis not present

## 2019-05-06 DIAGNOSIS — I129 Hypertensive chronic kidney disease with stage 1 through stage 4 chronic kidney disease, or unspecified chronic kidney disease: Secondary | ICD-10-CM | POA: Diagnosis not present

## 2019-05-06 DIAGNOSIS — N178 Other acute kidney failure: Secondary | ICD-10-CM | POA: Diagnosis not present

## 2019-05-06 DIAGNOSIS — H401131 Primary open-angle glaucoma, bilateral, mild stage: Secondary | ICD-10-CM | POA: Diagnosis not present

## 2019-05-06 DIAGNOSIS — H4089 Other specified glaucoma: Secondary | ICD-10-CM | POA: Diagnosis not present

## 2019-05-06 DIAGNOSIS — E11311 Type 2 diabetes mellitus with unspecified diabetic retinopathy with macular edema: Secondary | ICD-10-CM | POA: Diagnosis not present

## 2019-05-06 DIAGNOSIS — E118 Type 2 diabetes mellitus with unspecified complications: Secondary | ICD-10-CM | POA: Diagnosis not present

## 2019-05-06 DIAGNOSIS — H16223 Keratoconjunctivitis sicca, not specified as Sjogren's, bilateral: Secondary | ICD-10-CM | POA: Diagnosis not present

## 2019-05-06 DIAGNOSIS — H0102A Squamous blepharitis right eye, upper and lower eyelids: Secondary | ICD-10-CM | POA: Diagnosis not present

## 2019-05-10 DIAGNOSIS — C50911 Malignant neoplasm of unspecified site of right female breast: Secondary | ICD-10-CM | POA: Diagnosis not present

## 2019-05-11 DIAGNOSIS — C50911 Malignant neoplasm of unspecified site of right female breast: Secondary | ICD-10-CM | POA: Diagnosis not present

## 2019-05-12 DIAGNOSIS — E1122 Type 2 diabetes mellitus with diabetic chronic kidney disease: Secondary | ICD-10-CM | POA: Diagnosis not present

## 2019-05-12 DIAGNOSIS — E039 Hypothyroidism, unspecified: Secondary | ICD-10-CM | POA: Diagnosis not present

## 2019-05-12 DIAGNOSIS — H409 Unspecified glaucoma: Secondary | ICD-10-CM | POA: Diagnosis not present

## 2019-05-12 DIAGNOSIS — R1312 Dysphagia, oropharyngeal phase: Secondary | ICD-10-CM | POA: Diagnosis not present

## 2019-05-12 DIAGNOSIS — E1165 Type 2 diabetes mellitus with hyperglycemia: Secondary | ICD-10-CM | POA: Diagnosis not present

## 2019-05-12 DIAGNOSIS — Z794 Long term (current) use of insulin: Secondary | ICD-10-CM | POA: Diagnosis not present

## 2019-05-12 DIAGNOSIS — Z9011 Acquired absence of right breast and nipple: Secondary | ICD-10-CM | POA: Diagnosis not present

## 2019-05-12 DIAGNOSIS — H3530 Unspecified macular degeneration: Secondary | ICD-10-CM | POA: Diagnosis not present

## 2019-05-12 DIAGNOSIS — E1142 Type 2 diabetes mellitus with diabetic polyneuropathy: Secondary | ICD-10-CM | POA: Diagnosis not present

## 2019-05-12 DIAGNOSIS — M1990 Unspecified osteoarthritis, unspecified site: Secondary | ICD-10-CM | POA: Diagnosis not present

## 2019-05-12 DIAGNOSIS — N189 Chronic kidney disease, unspecified: Secondary | ICD-10-CM | POA: Diagnosis not present

## 2019-05-12 DIAGNOSIS — G9340 Encephalopathy, unspecified: Secondary | ICD-10-CM | POA: Diagnosis not present

## 2019-05-12 DIAGNOSIS — I129 Hypertensive chronic kidney disease with stage 1 through stage 4 chronic kidney disease, or unspecified chronic kidney disease: Secondary | ICD-10-CM | POA: Diagnosis not present

## 2019-05-12 DIAGNOSIS — Z79899 Other long term (current) drug therapy: Secondary | ICD-10-CM | POA: Diagnosis not present

## 2019-05-12 DIAGNOSIS — I972 Postmastectomy lymphedema syndrome: Secondary | ICD-10-CM | POA: Diagnosis not present

## 2019-05-13 NOTE — Telephone Encounter (Signed)
Start of care was 4/14 per [facility], PT and [facility] will call for plan of care

## 2019-05-14 NOTE — Telephone Encounter (Signed)
Transition Care Management Follow-up Telephone Call   Date discharged?"i dont know maybe last Sunday"   How have you been since you were released from the hospital?"i dont know, I m tired and my blood sugar gets too low", what do you do when this happens? "I eat some of those little round sugar things"   Do you understand why you were in the hospital? "my side was trembling"   Do you understand the discharge instructions? "They told my son, they didn't tell me"   Where were you discharged to?"first I went to [facility] and stayed about a month thhen my son came and got me"   Items Reviewed:  Medications reviewed: attempted, she stated she didn't know, her son knows and he's at work  Allergies reviewed: yes, but she said" is that right"  Dietary changes reviewed: " im not hungry"  Referrals reviewed: she states her son will need to decide   Functional Questionnaire:   Activities of Daily Living (ADLs):   She states they are independent in the following: I can do some" States they require assistance with the following: "my son does some and beyonce does some"   Any transportation issues/concerns?: "my son takes me"   Any patient concerns? "I just dont understand what's wrong" she was reassured and will speak w/ dr Dareen Piano at appt   Confirmed importance and date/time of follow-up visits scheduled yes, but wants a reminder  Provider Appointment booked with narendra  Confirmed with patient if condition begins to worsen call PCP or go to the ER.  Patient was given the office number and encouraged to call back with question or concerns.  : yes

## 2019-05-15 ENCOUNTER — Telehealth: Payer: Self-pay | Admitting: Internal Medicine

## 2019-05-15 DIAGNOSIS — Z9011 Acquired absence of right breast and nipple: Secondary | ICD-10-CM | POA: Diagnosis not present

## 2019-05-15 DIAGNOSIS — R1312 Dysphagia, oropharyngeal phase: Secondary | ICD-10-CM | POA: Diagnosis not present

## 2019-05-15 DIAGNOSIS — Z794 Long term (current) use of insulin: Secondary | ICD-10-CM | POA: Diagnosis not present

## 2019-05-15 DIAGNOSIS — E1165 Type 2 diabetes mellitus with hyperglycemia: Secondary | ICD-10-CM | POA: Diagnosis not present

## 2019-05-15 DIAGNOSIS — E1142 Type 2 diabetes mellitus with diabetic polyneuropathy: Secondary | ICD-10-CM | POA: Diagnosis not present

## 2019-05-15 DIAGNOSIS — N189 Chronic kidney disease, unspecified: Secondary | ICD-10-CM | POA: Diagnosis not present

## 2019-05-15 DIAGNOSIS — Z79899 Other long term (current) drug therapy: Secondary | ICD-10-CM | POA: Diagnosis not present

## 2019-05-15 DIAGNOSIS — H3530 Unspecified macular degeneration: Secondary | ICD-10-CM | POA: Diagnosis not present

## 2019-05-15 DIAGNOSIS — I129 Hypertensive chronic kidney disease with stage 1 through stage 4 chronic kidney disease, or unspecified chronic kidney disease: Secondary | ICD-10-CM | POA: Diagnosis not present

## 2019-05-15 DIAGNOSIS — I972 Postmastectomy lymphedema syndrome: Secondary | ICD-10-CM | POA: Diagnosis not present

## 2019-05-15 DIAGNOSIS — G9340 Encephalopathy, unspecified: Secondary | ICD-10-CM | POA: Diagnosis not present

## 2019-05-15 DIAGNOSIS — E1122 Type 2 diabetes mellitus with diabetic chronic kidney disease: Secondary | ICD-10-CM | POA: Diagnosis not present

## 2019-05-15 DIAGNOSIS — M1990 Unspecified osteoarthritis, unspecified site: Secondary | ICD-10-CM | POA: Diagnosis not present

## 2019-05-15 DIAGNOSIS — H409 Unspecified glaucoma: Secondary | ICD-10-CM | POA: Diagnosis not present

## 2019-05-15 DIAGNOSIS — E039 Hypothyroidism, unspecified: Secondary | ICD-10-CM | POA: Diagnosis not present

## 2019-05-15 NOTE — Telephone Encounter (Signed)
   Reason for call:   I received a call from [name] at 1:40  PM indicating question about insulin.   Pertinent Data:   Home nurse mentions that patient's son has not given the insulin to the patient and just gave her Metformin. Her blood sugar ranges between 50-150. She wants to know if it ok to wait without insulin until monday.   Assessment / Plan / Recommendations:   Given BS runs low to normal, she will likely be Ok to hols insulin today but nees to talk to PCP. No symptoms of hypoglycemia and BS now is OK. I explained that decision about insulin regimen should be discussed more with having all patient CBG numbers. Home nurse verbalizes understanding and mentions that she will contact PCP on Monday.    As always, pt is advised that if symptoms worsen or new symptoms arise, they should go to an urgent care facility or to to ER for further evaluation.   Dewayne Hatch, MD   05/15/2019, 1:37 PM

## 2019-05-16 NOTE — Telephone Encounter (Signed)
I called patient's house to talk to check how patient blood sugar is. (Pt's nurse called me yesterday and was concern about patient's son did not give her insulin. She mentioned that her blood sugar never went higher than 150 despite not taking Insulin.) When I called patient's home today, to know more about her BG numbers, because I am concern holding her insulin make her hyperglycemic again and at the same time, she apparently  The RN was not at home and patient answered the phone. She did not have an idea about the concern. She mentions that she injects her Insulin as she was told. I asked what blood sugar was. She mentions that she did not check it today but she is doing well. I recommended to check her blood sugar and follow the instruction provided to her at prior visit/hospitalization.

## 2019-05-17 ENCOUNTER — Telehealth: Payer: Self-pay | Admitting: *Deleted

## 2019-05-17 ENCOUNTER — Other Ambulatory Visit: Payer: Self-pay | Admitting: *Deleted

## 2019-05-17 DIAGNOSIS — G9340 Encephalopathy, unspecified: Secondary | ICD-10-CM | POA: Diagnosis not present

## 2019-05-17 DIAGNOSIS — E1122 Type 2 diabetes mellitus with diabetic chronic kidney disease: Secondary | ICD-10-CM | POA: Diagnosis not present

## 2019-05-17 DIAGNOSIS — I129 Hypertensive chronic kidney disease with stage 1 through stage 4 chronic kidney disease, or unspecified chronic kidney disease: Secondary | ICD-10-CM | POA: Diagnosis not present

## 2019-05-17 DIAGNOSIS — H409 Unspecified glaucoma: Secondary | ICD-10-CM | POA: Diagnosis not present

## 2019-05-17 DIAGNOSIS — R1312 Dysphagia, oropharyngeal phase: Secondary | ICD-10-CM | POA: Diagnosis not present

## 2019-05-17 DIAGNOSIS — E039 Hypothyroidism, unspecified: Secondary | ICD-10-CM | POA: Diagnosis not present

## 2019-05-17 DIAGNOSIS — N189 Chronic kidney disease, unspecified: Secondary | ICD-10-CM | POA: Diagnosis not present

## 2019-05-17 DIAGNOSIS — H3530 Unspecified macular degeneration: Secondary | ICD-10-CM | POA: Diagnosis not present

## 2019-05-17 DIAGNOSIS — E1142 Type 2 diabetes mellitus with diabetic polyneuropathy: Secondary | ICD-10-CM

## 2019-05-17 DIAGNOSIS — Z9011 Acquired absence of right breast and nipple: Secondary | ICD-10-CM | POA: Diagnosis not present

## 2019-05-17 DIAGNOSIS — M1990 Unspecified osteoarthritis, unspecified site: Secondary | ICD-10-CM | POA: Diagnosis not present

## 2019-05-17 DIAGNOSIS — Z794 Long term (current) use of insulin: Secondary | ICD-10-CM | POA: Diagnosis not present

## 2019-05-17 DIAGNOSIS — I972 Postmastectomy lymphedema syndrome: Secondary | ICD-10-CM | POA: Diagnosis not present

## 2019-05-17 DIAGNOSIS — Z79899 Other long term (current) drug therapy: Secondary | ICD-10-CM | POA: Diagnosis not present

## 2019-05-17 DIAGNOSIS — E1165 Type 2 diabetes mellitus with hyperglycemia: Secondary | ICD-10-CM | POA: Diagnosis not present

## 2019-05-17 NOTE — Telephone Encounter (Signed)
agree

## 2019-05-17 NOTE — Telephone Encounter (Signed)
Can she make an appt in [facility] sooner than May 4th. She sounds like she needs close follow up

## 2019-05-17 NOTE — Telephone Encounter (Signed)
[place] OT ask for VO: 1x week for 5 weeks, safety, R arm coordination training. Do you agree?

## 2019-05-18 MED ORDER — ROSUVASTATIN CALCIUM 20 MG PO TABS: 20.0000 mg | ORAL_TABLET | Freq: Every day | ORAL | 1 refills | Status: DC

## 2019-05-18 MED ORDER — OZEMPIC (0.25 OR 0.5 MG/DOSE) 2 MG/1.5ML ~~LOC~~ SOPN: 0.5000 mg | PEN_INJECTOR | SUBCUTANEOUS | 1 refills | Status: DC

## 2019-05-18 NOTE — Telephone Encounter (Signed)
Just spoke with patient about coming in sooner at the request of Dr. Dareen Piano.  Patient states that she now resides in [facility]  I asked her was she able to get transportation for an appt this week states she is unsure.  Asked to please call me back if she able to find a ride later on this week. Forwarding back to PCP to make him aware.

## 2019-05-18 NOTE — Telephone Encounter (Signed)
Thank you! Let me know what happens

## 2019-05-19 ENCOUNTER — Other Ambulatory Visit: Payer: Self-pay | Admitting: *Deleted

## 2019-05-19 ENCOUNTER — Telehealth: Payer: Self-pay | Admitting: *Deleted

## 2019-05-19 DIAGNOSIS — Z9011 Acquired absence of right breast and nipple: Secondary | ICD-10-CM | POA: Diagnosis not present

## 2019-05-19 DIAGNOSIS — F32A Depression, unspecified: Secondary | ICD-10-CM

## 2019-05-19 DIAGNOSIS — F329 Major depressive disorder, single episode, unspecified: Secondary | ICD-10-CM

## 2019-05-19 DIAGNOSIS — M1990 Unspecified osteoarthritis, unspecified site: Secondary | ICD-10-CM | POA: Diagnosis not present

## 2019-05-19 DIAGNOSIS — I129 Hypertensive chronic kidney disease with stage 1 through stage 4 chronic kidney disease, or unspecified chronic kidney disease: Secondary | ICD-10-CM | POA: Diagnosis not present

## 2019-05-19 DIAGNOSIS — E1142 Type 2 diabetes mellitus with diabetic polyneuropathy: Secondary | ICD-10-CM | POA: Diagnosis not present

## 2019-05-19 DIAGNOSIS — Z794 Long term (current) use of insulin: Secondary | ICD-10-CM | POA: Diagnosis not present

## 2019-05-19 DIAGNOSIS — E039 Hypothyroidism, unspecified: Secondary | ICD-10-CM

## 2019-05-19 DIAGNOSIS — E1165 Type 2 diabetes mellitus with hyperglycemia: Secondary | ICD-10-CM | POA: Diagnosis not present

## 2019-05-19 DIAGNOSIS — R1312 Dysphagia, oropharyngeal phase: Secondary | ICD-10-CM | POA: Diagnosis not present

## 2019-05-19 DIAGNOSIS — N189 Chronic kidney disease, unspecified: Secondary | ICD-10-CM | POA: Diagnosis not present

## 2019-05-19 DIAGNOSIS — I972 Postmastectomy lymphedema syndrome: Secondary | ICD-10-CM | POA: Diagnosis not present

## 2019-05-19 DIAGNOSIS — E1122 Type 2 diabetes mellitus with diabetic chronic kidney disease: Secondary | ICD-10-CM | POA: Diagnosis not present

## 2019-05-19 DIAGNOSIS — H409 Unspecified glaucoma: Secondary | ICD-10-CM | POA: Diagnosis not present

## 2019-05-19 DIAGNOSIS — H3530 Unspecified macular degeneration: Secondary | ICD-10-CM | POA: Diagnosis not present

## 2019-05-19 DIAGNOSIS — Z79899 Other long term (current) drug therapy: Secondary | ICD-10-CM | POA: Diagnosis not present

## 2019-05-19 DIAGNOSIS — G9340 Encephalopathy, unspecified: Secondary | ICD-10-CM | POA: Diagnosis not present

## 2019-05-20 DIAGNOSIS — E1165 Type 2 diabetes mellitus with hyperglycemia: Secondary | ICD-10-CM | POA: Diagnosis not present

## 2019-05-20 DIAGNOSIS — I129 Hypertensive chronic kidney disease with stage 1 through stage 4 chronic kidney disease, or unspecified chronic kidney disease: Secondary | ICD-10-CM | POA: Diagnosis not present

## 2019-05-20 DIAGNOSIS — Z794 Long term (current) use of insulin: Secondary | ICD-10-CM | POA: Diagnosis not present

## 2019-05-20 DIAGNOSIS — H3530 Unspecified macular degeneration: Secondary | ICD-10-CM | POA: Diagnosis not present

## 2019-05-20 DIAGNOSIS — N189 Chronic kidney disease, unspecified: Secondary | ICD-10-CM | POA: Diagnosis not present

## 2019-05-20 DIAGNOSIS — E1142 Type 2 diabetes mellitus with diabetic polyneuropathy: Secondary | ICD-10-CM | POA: Diagnosis not present

## 2019-05-20 DIAGNOSIS — M1990 Unspecified osteoarthritis, unspecified site: Secondary | ICD-10-CM | POA: Diagnosis not present

## 2019-05-20 DIAGNOSIS — Z79899 Other long term (current) drug therapy: Secondary | ICD-10-CM | POA: Diagnosis not present

## 2019-05-20 DIAGNOSIS — E1122 Type 2 diabetes mellitus with diabetic chronic kidney disease: Secondary | ICD-10-CM | POA: Diagnosis not present

## 2019-05-20 DIAGNOSIS — H409 Unspecified glaucoma: Secondary | ICD-10-CM | POA: Diagnosis not present

## 2019-05-20 DIAGNOSIS — G9340 Encephalopathy, unspecified: Secondary | ICD-10-CM | POA: Diagnosis not present

## 2019-05-20 DIAGNOSIS — I972 Postmastectomy lymphedema syndrome: Secondary | ICD-10-CM | POA: Diagnosis not present

## 2019-05-20 DIAGNOSIS — Z9011 Acquired absence of right breast and nipple: Secondary | ICD-10-CM | POA: Diagnosis not present

## 2019-05-20 DIAGNOSIS — R1312 Dysphagia, oropharyngeal phase: Secondary | ICD-10-CM | POA: Diagnosis not present

## 2019-05-20 DIAGNOSIS — E039 Hypothyroidism, unspecified: Secondary | ICD-10-CM | POA: Diagnosis not present

## 2019-05-20 MED ORDER — BUSPIRONE HCL 5 MG PO TABS: 5.0000 mg | ORAL_TABLET | Freq: Two times a day (BID) | ORAL | 1 refills | Status: DC

## 2019-05-20 MED ORDER — LEVOTHYROXINE SODIUM 100 MCG PO TABS: 100.0000 ug | ORAL_TABLET | Freq: Every day | ORAL | 1 refills | Status: DC

## 2019-05-20 MED ORDER — VENLAFAXINE HCL ER 150 MG PO CP24: ORAL_CAPSULE | ORAL | 1 refills | Status: DC

## 2019-05-21 ENCOUNTER — Telehealth: Payer: Self-pay | Admitting: *Deleted

## 2019-05-21 DIAGNOSIS — E1122 Type 2 diabetes mellitus with diabetic chronic kidney disease: Secondary | ICD-10-CM | POA: Diagnosis not present

## 2019-05-21 DIAGNOSIS — E1142 Type 2 diabetes mellitus with diabetic polyneuropathy: Secondary | ICD-10-CM | POA: Diagnosis not present

## 2019-05-21 DIAGNOSIS — Z9011 Acquired absence of right breast and nipple: Secondary | ICD-10-CM | POA: Diagnosis not present

## 2019-05-21 DIAGNOSIS — E1165 Type 2 diabetes mellitus with hyperglycemia: Secondary | ICD-10-CM | POA: Diagnosis not present

## 2019-05-21 DIAGNOSIS — G9340 Encephalopathy, unspecified: Secondary | ICD-10-CM | POA: Diagnosis not present

## 2019-05-21 DIAGNOSIS — H3530 Unspecified macular degeneration: Secondary | ICD-10-CM | POA: Diagnosis not present

## 2019-05-21 DIAGNOSIS — N189 Chronic kidney disease, unspecified: Secondary | ICD-10-CM | POA: Diagnosis not present

## 2019-05-21 DIAGNOSIS — Z794 Long term (current) use of insulin: Secondary | ICD-10-CM | POA: Diagnosis not present

## 2019-05-21 DIAGNOSIS — E039 Hypothyroidism, unspecified: Secondary | ICD-10-CM | POA: Diagnosis not present

## 2019-05-21 DIAGNOSIS — M1990 Unspecified osteoarthritis, unspecified site: Secondary | ICD-10-CM | POA: Diagnosis not present

## 2019-05-21 DIAGNOSIS — I129 Hypertensive chronic kidney disease with stage 1 through stage 4 chronic kidney disease, or unspecified chronic kidney disease: Secondary | ICD-10-CM | POA: Diagnosis not present

## 2019-05-21 DIAGNOSIS — H409 Unspecified glaucoma: Secondary | ICD-10-CM | POA: Diagnosis not present

## 2019-05-21 DIAGNOSIS — R1312 Dysphagia, oropharyngeal phase: Secondary | ICD-10-CM | POA: Diagnosis not present

## 2019-05-21 DIAGNOSIS — Z79899 Other long term (current) drug therapy: Secondary | ICD-10-CM | POA: Diagnosis not present

## 2019-05-21 DIAGNOSIS — I972 Postmastectomy lymphedema syndrome: Secondary | ICD-10-CM | POA: Diagnosis not present

## 2019-05-21 NOTE — Telephone Encounter (Signed)
#  30 effexor called to [facility] no refills, until order from [facility] comes, spoke w/ dr Dareen Piano

## 2019-05-24 ENCOUNTER — Ambulatory Visit: Payer: Medicare Other | Admitting: *Deleted

## 2019-05-24 DIAGNOSIS — Z9011 Acquired absence of right breast and nipple: Secondary | ICD-10-CM | POA: Diagnosis not present

## 2019-05-24 DIAGNOSIS — I10 Essential (primary) hypertension: Secondary | ICD-10-CM

## 2019-05-24 DIAGNOSIS — Z794 Long term (current) use of insulin: Secondary | ICD-10-CM | POA: Diagnosis not present

## 2019-05-24 DIAGNOSIS — M1990 Unspecified osteoarthritis, unspecified site: Secondary | ICD-10-CM | POA: Diagnosis not present

## 2019-05-24 DIAGNOSIS — G9340 Encephalopathy, unspecified: Secondary | ICD-10-CM | POA: Diagnosis not present

## 2019-05-24 DIAGNOSIS — I972 Postmastectomy lymphedema syndrome: Secondary | ICD-10-CM | POA: Diagnosis not present

## 2019-05-24 DIAGNOSIS — E039 Hypothyroidism, unspecified: Secondary | ICD-10-CM | POA: Diagnosis not present

## 2019-05-24 DIAGNOSIS — H3530 Unspecified macular degeneration: Secondary | ICD-10-CM | POA: Diagnosis not present

## 2019-05-24 DIAGNOSIS — E1165 Type 2 diabetes mellitus with hyperglycemia: Secondary | ICD-10-CM | POA: Diagnosis not present

## 2019-05-24 DIAGNOSIS — R1312 Dysphagia, oropharyngeal phase: Secondary | ICD-10-CM | POA: Diagnosis not present

## 2019-05-24 DIAGNOSIS — E1122 Type 2 diabetes mellitus with diabetic chronic kidney disease: Secondary | ICD-10-CM | POA: Diagnosis not present

## 2019-05-24 DIAGNOSIS — N189 Chronic kidney disease, unspecified: Secondary | ICD-10-CM | POA: Diagnosis not present

## 2019-05-24 DIAGNOSIS — Z79899 Other long term (current) drug therapy: Secondary | ICD-10-CM | POA: Diagnosis not present

## 2019-05-24 DIAGNOSIS — H409 Unspecified glaucoma: Secondary | ICD-10-CM | POA: Diagnosis not present

## 2019-05-24 DIAGNOSIS — E1142 Type 2 diabetes mellitus with diabetic polyneuropathy: Secondary | ICD-10-CM | POA: Diagnosis not present

## 2019-05-24 DIAGNOSIS — I129 Hypertensive chronic kidney disease with stage 1 through stage 4 chronic kidney disease, or unspecified chronic kidney disease: Secondary | ICD-10-CM | POA: Diagnosis not present

## 2019-05-24 NOTE — Patient Instructions (Signed)
Visit Information It was nice talking to you and your son [name] today. I will call you when [name] lets me know  the home health services have stopped but you or [name] can call me for questions or concerns. Please review the educational information I sent you and we will discuss on the next call. I hope the medication box helps you.  Goals Addressed            This Visit's Progress     Patient Stated   . " I don't know why my blood sugar was so high when they took me to the hospital in March. I think I was taking my medications like I'm suppose to." (pt-stated)       CARE PLAN ENTRY (see longitudinal plan of care for additional care plan information)  Current Barriers:  . Chronic Disease Management support, education, and care coordination needs related to HTN, HLD, DMII, CKD Stage 3, and Depression- patient states she is currently living with her son [name] in [place] since her discharge from [facility] after being in the hospital  from 3-21-3/24 for hyperglycemia with new onset seizures. She was not able to name her current medications so she asked for her son [name] to speak to this CCM RN. [name] states patient is receiving home health services of PT/OT/RN 4 days per week and that patient's blood pressure and blood sugar have been good. He says he helps his mother with medication administration and with checking her blood sugar twice daily, after breakfast and before supper. He says she is no longer taking any insulin because she is not eating well and he blood sugar was dropping low with the insulin. He reports post meal and pre meal values of 90- 130 consistently with Metformin an Ozempic. He says a medication box might help improve patient's medication taking behavior. He says he was recently diagnosed with Type 2 DM and is taking DM classes and managing his diabetes with diet and exercise. He says patient hasn't driven in quite awhile due to weakness in her hand. He is interested in  hearing about transportation options and if patient qualifies for help in the home.   Clinical Goal(s) related to  HTN, HLD, DMII, CKD Stage 3, and Depression Over the next 7-14 days, patient will:  . Work with the care management team to address educational, disease management, and care coordination needs  . Begin or continue self health monitoring activities as directed today Measure and record CBG (blood glucose) 2 times daily and take medications as prescribed . Call provider office for new or worsened signs and symptoms HTN, HLD, DMII, CKD Stage 3, and Depression . Call care management team with questions or concerns . Verbalize basic understanding of patient centered plan of care established today  Interventions related to HTN, HLD, DMII, CKD Stage 3, and Depression . Evaluation of current treatment plans and patient's adherence to plan as established by provider . Assessed patient understanding of disease states . Assessed patient's education and care coordination needs . Provided disease specific education to patient - mailed 2 DM education booklets and [place] Management calendar with action plans and stop light information for DM and HTN and a medication box. . Requested [name] call this CCM RN when patient is no longer receiving home health services.  Nash Dimmer with appropriate clinical care team members regarding patient needs  Patient Self Care Activities related to HTN, HLD, DMII, CKD Stage 3, and Depression  Patient is unable to independently self-manage chronic health conditions  Initial goal documentation     . 'My mom could use some help when she goes back to her house including help with transportation.." (pt-stated)       CARE PLAN ENTRY (see longitudinal plan of care for additional care plan information)   Current Barriers:  . Chronic Disease Management support, education, and care coordination needs related to HTN, HLD, DMII, CKD Stage 3,  and Depression- Patient is currently living with her son [name] in [place] but he says she will eventually return to her home in [place]. He says patient hasn't driven in quite awhile due to weakness in her hand. He is interested in hearing about transportation options and if patient qualifies for help in the home.   Case Manager Clinical Goal(s):  [name] Over the next 7-14 days, patient will work with BSW to address needs related to ADL IADL limitations and transportation concerns in patient with HTN, HLD, DMII, CKD Stage 3, and Depression  Interventions:  . Collaborated with BSW to initiate plan of care to address needs related to ADL IADL limitations in patient with HTN, HLD, DMII, CKD Stage 3, and Depression  Patient Self Care Activities:  . Patient verbalizes understanding of plan to work with BSW and other health team members to address patient needs . Self administers medications as prescribed . Attends all scheduled provider appointments . Calls pharmacy for medication refills . Performs ADL's independently . Performs IADL's independently . Calls provider office for new concerns or questions  Initial goal documentation       Other   . Blood Pressure < 140/90       BP Readings from Last 3 Encounters:  04/21/19 (!) 160/80  03/15/19 (!) 156/74  01/13/19 140/68     Not meeting targets for SBP    . HEMOGLOBIN A1C < 7.0       Lab Results  Component Value Date   HGBA1C 14.4 (H) 04/18/2019     Not meeting Hgb A1C target    . LDL CALC < 100       Lab Results  Component Value Date   CHOL 228 (H) 10/13/2018   HDL 55 10/13/2018   LDLCALC 154 (H) 10/13/2018   TRIG 108 10/13/2018   CHOLHDL 4.1 10/13/2018    Not meeting LDL and total cholesterol target       Ms. Spitzley was given information about Chronic Care Management services today including:  1. CCM service includes personalized support from designated clinical staff supervised by her physician, including  individualized plan of care and coordination with other care providers 2. 24/7 contact phone numbers for assistance for urgent and routine care needs. 3. Service will only be billed when office clinical staff spend 20 minutes or more in a month to coordinate care. 4. Only one practitioner may furnish and bill the service in a calendar month. 5. The patient may stop CCM services at any time (effective at the end of the month) by phone call to the office staff. 6. The patient will be responsible for cost sharing (co-pay) of up to 20% of the service fee (after annual deductible is met).  Patient agreed to services and verbal consent obtained.   The patient verbalized understanding of instructions provided today and declined a print copy of patient instruction materials.   The care management team will reach out to the patient again over the next 7-10 days.   Kelli Churn RN,  CCM, [facility] RN Care Manager [phone]

## 2019-05-24 NOTE — Chronic Care Management (AMB) (Signed)
Chronic Care Management   Initial Visit Note  05/24/2019 Name: [name] MRN: [id number] DOB: 1950/07/18  Referred by: Aldine Contes, MD Reason for referral : Chronic Care Management (DM ,HTN, CKD)   [name] is a 69 y.o. year old female who is a primary care patient of Aldine Contes, MD. The CCM team was consulted for assistance with chronic disease management and care coordination needs related to HTN, HLD, DMII, CKD Stage 3 and Depression  Review of patient status, including review of consultants reports, relevant laboratory and other test results, and collaboration with appropriate care team members and the patient's provider was performed as part of comprehensive patient evaluation and provision of chronic care management services.    SDOH (Social Determinants of Health) assessments performed: Yes See Care Plan activities for detailed interventions related to SDOH  SDOH Interventions     Most Recent Value  SDOH Interventions  SDOH Interventions for the Following Domains  Transportation  Transportation Interventions  Other (Comment) [son says she is interested in discussing transportation options and possible PCS services for his Mom]       Medications: Outpatient Encounter Medications as of 05/24/2019  Medication Sig Note  . Acetaminophen 325 MG CAPS Take 325 mg by mouth every 6 (six) hours as needed (for pain).   . Blood Glucose Monitoring Suppl (ONETOUCH VERIO) w/Device KIT Check blood sugar up to 5 times a day   . metFORMIN (GLUCOPHAGE) 1000 MG tablet Take 1 tablet (1,000 mg total) by mouth 2 (two) times daily with a meal. 04/17/2019: #180 filled 12/07/2018 [facility] per external pharmacy records  . Semaglutide,0.25 or 0.5MG/DOS, (OZEMPIC, 0.25 OR 0.5 MG/DOSE,) 2 MG/1.5ML SOPN Inject 0.5 mg into the skin once a week.   . venlafaxine XR (EFFEXOR-XR) 150 MG 24 hr capsule TAKE 1 CAPSULE BY MOUTH DAILY WITH BREAKFAST   . ALPHAGAN P 0.1 % SOLN Place 1 drop into both  eyes 3 (three) times daily. 04/17/2019: No record of this in past 6 months per external pharmacy records  . Besifloxacin HCl (BESIVANCE) 0.6 % SUSP Besivance 0.6 % eye drops,suspension  INSTILL 1 DROP INTO THE LEFT EYE QID X 2 DAYS AFTER EACH MONTHLY EYE INJECTION 04/17/2019: No record of this in past 6 months per external pharmacy records  . busPIRone (BUSPAR) 5 MG tablet Take 1 tablet (5 mg total) by mouth 2 (two) times daily.   . calcium citrate-vitamin D (CITRACAL+D) 315-200 MG-UNIT tablet Take 1 tablet by mouth 2 (two) times daily. (Patient not taking: Reported on 11/20/2017)   . Cholecalciferol (VITAMIN D3) 2000 units TABS Take 2,000 Units at bedtime by mouth.    . ciprofloxacin (CILOXAN) 0.3 % ophthalmic ointment  04/17/2019: No record of this in past 6 months per external pharmacy records  . cycloSPORINE (RESTASIS) 0.05 % ophthalmic emulsion Restasis MultiDose 0.05 % eye drops  INT 1 GTT IN [facility] EYE BID 04/17/2019: No record of this in past 6 months per external pharmacy records  . erythromycin ophthalmic ointment APPLY A SMALL AMOUNT INTO LEFT EYE TID 04/17/2019: No record of this in past 6 months per external pharmacy records  . glucose 4 GM chewable tablet Chew 4 tablets (16 g total) by mouth as needed for low blood sugar. 04/17/2019: No record of this in past 6 months per external pharmacy records  . glucose blood (ONETOUCH VERIO) test strip CHECK BLOOD SUGAR before meals and bedtime   . insulin degludec (TRESIBA FLEXTOUCH) 100 UNIT/ML SOPN FlexTouch Pen Inject 0.16 mLs (16  Units total) into the skin at bedtime. 04/17/2019: No record of this in past 6 months per external pharmacy records  . insulin detemir (LEVEMIR) 100 UNIT/ML injection Inject 60 Units into the skin every morning.   . insulin lispro (HUMALOG KWIKPEN) 100 UNIT/ML KwikPen Inject 0.08-0.1 mLs (8-10 Units total) into the skin 3 (three) times daily. (Patient not taking: Reported on 05/24/2019) 04/17/2019: 30 mls filled 11/10/2018  [facility] per external pharmacy records  . Insulin Pen Needle (BD PEN NEEDLE NANO U/F) 32G X 4 MM MISC USE AS DIRECTED   . ketorolac (ACULAR) 0.5 % ophthalmic solution Place 1 drop into both eyes 3 (three) times daily. 04/17/2019: No record of this in past 6 months per external pharmacy records  . levETIRAcetam (KEPPRA) 500 MG tablet Take 1 tablet (500 mg total) by mouth 2 (two) times daily for 3 days.   [name] levothyroxine (SYNTHROID) 100 MCG tablet Take 1 tablet (100 mcg total) by mouth daily before breakfast.   . Melatonin 1 MG TABS Take 1 tablet (1 mg total) by mouth daily. 04/17/2019: No record of this in past 6 months per external pharmacy records  . Nutritional Supplements (GLUCERNA SNACK SHAKE) LIQD Take 1 Dose by mouth 3 (three) times daily as needed.   . Olopatadine HCl (PATADAY) 0.2 % SOLN Place 1 drop into both eyes daily as needed (dry eyes).  04/17/2019: No record of this in past 6 months per external pharmacy records  . OneTouch Delica Lancets 37S MISC Use to check blood sugar before meals and at bedtime   . Polyethyl Glycol-Propyl Glycol (SYSTANE) 0.4-0.3 % GEL ophthalmic gel Place 1 application daily as needed into both eyes (dry eyes/irritation).    . prednisoLONE acetate (PRED FORTE) 1 % ophthalmic suspension  04/17/2019: No record of this in past 6 months per external pharmacy records  . rosuvastatin (CRESTOR) 20 MG tablet Take 1 tablet (20 mg total) by mouth daily. TAKE 1 TABLET BY MOUTH EVERY DAY   . valACYclovir (VALTREX) 500 MG tablet TK 1 T PO QD 04/17/2019: No record of this in past 6 months per external pharmacy records   No facility-administered encounter medications on file as of 05/24/2019.     Objective:  Wt Readings from Last 3 Encounters:  04/18/19 121 lb 11.1 oz (55.2 kg)  03/15/19 127 lb 6.4 oz (57.8 kg)  01/12/19 127 lb 4.8 oz (57.7 kg)    Goals Addressed            This Visit's Progress     Patient Stated   . " I don't know why my blood sugar was so  high when they took me to the hospital in March. I think I was taking my medications like I'm suppose to." (pt-stated)       CARE PLAN ENTRY (see longitudinal plan of care for additional care plan information)  Current Barriers:  . Chronic Disease Management support, education, and care coordination needs related to HTN, HLD, DMII, CKD Stage 3, and Depression- patient states she is currently living with her son [name] in [place] since her discharge from [facility] after being in the hospital  from 3-21-3/24 for hyperglycemia with new onset seizures. She was not able to name her current medications so she asked for her son [name] to speak to this CCM RN. [name] states patient is receiving home health services of PT/OT/RN 4 days per week and that patient's blood pressure and blood sugar have been good. He says he  helps his mother with medication administration and with checking her blood sugar twice daily, after breakfast and before supper. He says she is no longer taking any insulin because she is not eating well and he blood sugar was dropping low with the insulin. He reports post meal and pre meal values of 90- 130 consistently with Metformin and Ozempic only- no insulin. He says a medication box might help improve patient's medication taking behavior. He says he was recently diagnosed with Type 2 DM and is taking DM classe and managing his diabetes with diet and exercise. He says patient hasn't driven in quite awhile due to weakness in her hand. He is interested in hearing about transportation options and if patient qualifies for help in the home.   Clinical Goal(s) related to  HTN, HLD, DMII, CKD Stage 3, and Depression Over the next 7-14 days, patient will:  . Work with the care management team to address educational, disease management, and care coordination needs  . Begin or continue self health monitoring activities as directed today Measure and record CBG (blood glucose) 2 times daily and  take medications as prescribed . Call provider office for new or worsened signs and symptoms HTN, HLD, DMII, CKD Stage 3, and Depression . Call care management team with questions or concerns . Verbalize basic understanding of patient centered plan of care established today  Interventions related to HTN, HLD, DMII, CKD Stage 3, and Depression . Evaluation of current treatment plans and patient's adherence to plan as established by provider . Assessed patient understanding of disease states . Assessed patient's education and care coordination needs . Provided disease specific education to patient - mailed 2 DM education booklets and [place] Management calendar with action plans and stop light information for DM and HTN and a medication box. . Requested [name] call this CCM RN when patient is no longer receiving home health services.  [name] with appropriate clinical care team members regarding patient needs  Patient Self Care Activities related to HTN, HLD, DMII, CKD Stage 3, and Depression Patient is unable to independently self-manage chronic health conditions  Initial goal documentation     . 'My mom could use some help when she goes back to her house including help with transportation.." (pt-stated)       CARE PLAN ENTRY (see longitudinal plan of care for additional care plan information)   Current Barriers:  . Chronic Disease Management support, education, and care coordination needs related to HTN, HLD, DMII, CKD Stage 3, and Depression- Patient is currently living with her son [name] in [place] but he says she will eventually return to her home in [place]. He says patient hasn't driven in quite awhile due to weakness in her hand. He is interested in hearing about transportation options and if patient qualifies for help in the home.   Case Manager Clinical Goal(s):  [name] Over the next 7-14 days, patient will work with BSW to address needs related to  ADL IADL limitations and transportation concerns in patient with HTN, HLD, DMII, CKD Stage 3, and Depression  Interventions:  . Collaborated with BSW to initiate plan of care to address needs related to ADL IADL limitations in patient with HTN, HLD, DMII, CKD Stage 3, and Depression  Patient Self Care Activities:  . Patient verbalizes understanding of plan to work with BSW and other health team members to address patient needs . Self administers medications as prescribed . Attends all scheduled provider appointments .  Calls pharmacy for medication refills . Performs ADL's independently . Performs IADL's independently . Calls provider office for new concerns or questions  Initial goal documentation       Other   . Blood Pressure < 140/90       BP Readings from Last 3 Encounters:  04/21/19 (!) 160/80  03/15/19 (!) 156/74  01/13/19 140/68     Not meeting targets for SBP    . HEMOGLOBIN A1C < 7.0       Lab Results  Component Value Date   HGBA1C 14.4 (H) 04/18/2019     Not meeting Hgb A1C target    . LDL CALC < 100       Lab Results  Component Value Date   CHOL 228 (H) 10/13/2018   HDL 55 10/13/2018   LDLCALC 154 (H) 10/13/2018   TRIG 108 10/13/2018   CHOLHDL 4.1 10/13/2018    Not meeting LDL and total cholesterol target        [name] was given information about Chronic Care Management services today including:  1. CCM service includes personalized support from designated clinical staff supervised by her physician, including individualized plan of care and coordination with other care providers 2. 24/7 contact phone numbers for assistance for urgent and routine care needs. 3. Service will only be billed when office clinical staff spend 20 minutes or more in a month to coordinate care. 4. Only one practitioner may furnish and bill the service in a calendar month. 5. The patient may stop CCM services at any time (effective at the end of the month) by phone call to  the office staff. 6. The patient will be responsible for cost sharing (co-pay) of up to 20% of the service fee (after annual deductible is met).  Patient agreed to services and verbal consent obtained.   Plan:   A HIPPA compliant phone message was left for the patient providing contact information and requesting a return call.  The care management team will reach out to the patient again over the next 7-10 days.   Kelli Churn RN, CCM, [facility] RN Care Manager [phone]

## 2019-05-25 ENCOUNTER — Ambulatory Visit: Payer: Medicare Other

## 2019-05-25 ENCOUNTER — Telehealth: Payer: Self-pay | Admitting: Internal Medicine

## 2019-05-25 DIAGNOSIS — I972 Postmastectomy lymphedema syndrome: Secondary | ICD-10-CM | POA: Diagnosis not present

## 2019-05-25 DIAGNOSIS — R1312 Dysphagia, oropharyngeal phase: Secondary | ICD-10-CM | POA: Diagnosis not present

## 2019-05-25 DIAGNOSIS — I129 Hypertensive chronic kidney disease with stage 1 through stage 4 chronic kidney disease, or unspecified chronic kidney disease: Secondary | ICD-10-CM | POA: Diagnosis not present

## 2019-05-25 DIAGNOSIS — N189 Chronic kidney disease, unspecified: Secondary | ICD-10-CM | POA: Diagnosis not present

## 2019-05-25 DIAGNOSIS — E1142 Type 2 diabetes mellitus with diabetic polyneuropathy: Secondary | ICD-10-CM

## 2019-05-25 DIAGNOSIS — H409 Unspecified glaucoma: Secondary | ICD-10-CM | POA: Diagnosis not present

## 2019-05-25 DIAGNOSIS — Z79899 Other long term (current) drug therapy: Secondary | ICD-10-CM | POA: Diagnosis not present

## 2019-05-25 DIAGNOSIS — Z794 Long term (current) use of insulin: Secondary | ICD-10-CM | POA: Diagnosis not present

## 2019-05-25 DIAGNOSIS — G9340 Encephalopathy, unspecified: Secondary | ICD-10-CM | POA: Diagnosis not present

## 2019-05-25 DIAGNOSIS — Z9011 Acquired absence of right breast and nipple: Secondary | ICD-10-CM | POA: Diagnosis not present

## 2019-05-25 DIAGNOSIS — E1122 Type 2 diabetes mellitus with diabetic chronic kidney disease: Secondary | ICD-10-CM | POA: Diagnosis not present

## 2019-05-25 DIAGNOSIS — H3530 Unspecified macular degeneration: Secondary | ICD-10-CM | POA: Diagnosis not present

## 2019-05-25 DIAGNOSIS — N183 Chronic kidney disease, stage 3 unspecified: Secondary | ICD-10-CM

## 2019-05-25 DIAGNOSIS — E1165 Type 2 diabetes mellitus with hyperglycemia: Secondary | ICD-10-CM | POA: Diagnosis not present

## 2019-05-25 DIAGNOSIS — M1990 Unspecified osteoarthritis, unspecified site: Secondary | ICD-10-CM | POA: Diagnosis not present

## 2019-05-25 DIAGNOSIS — E039 Hypothyroidism, unspecified: Secondary | ICD-10-CM | POA: Diagnosis not present

## 2019-05-25 NOTE — Progress Notes (Signed)
Internal Medicine Clinic Resident   I have personally reviewed this encounter including the documentation in this note and/or discussed this patient with the care management provider. I will address any urgent items identified by the care management provider and will communicate my actions to the patient's PCP. I have reviewed the patient's CCM visit with my supervising attending.  Brandon Shakala Marlatt, MD 05/25/2019    

## 2019-05-25 NOTE — Telephone Encounter (Signed)
CMA returned call-no answer....message left on recorder for return call.Despina Hidden Cassady4/27/202111:31 AM

## 2019-05-25 NOTE — Chronic Care Management (AMB) (Signed)
Care Management   Follow Up Note   05/25/2019 Name: [name] MRN: [id number] DOB: [birth year]  Referred by: Aldine Contes, MD Reason for referral : Care Coordination (SDOH Screening)   Andalyn Wemyss is a 69 y.o. year old female who is a primary care patient of Aldine Contes, MD. The care management team was consulted for assistance with care management and care coordination needs.    Review of patient status, including review of consultants reports, relevant laboratory and other test results, and collaboration with appropriate care team members and the patient's provider was performed as part of comprehensive patient evaluation and provision of chronic care management services.    SDOH (Social Determinants of Health) assessments performed: Yes See Care Plan activities for detailed interventions related to SDOH)  SDOH Interventions     Most Recent Value  SDOH Interventions  SDOH Interventions for the Following Domains  Transportation, Financial Strain  Financial Strain Interventions  -- [patient states that it's sometimes difficult to afford electricity/heat during the winter months but she is aware of and utilizes community resources]  Transportation Interventions  SCAT ([place])       Advanced Directives: See Care Plan and [place] application for related entries.   Goals Addressed            This Visit's Progress   . "I could use some help around the house when I get back home" (pt-stated)       Current Barriers:  [name] Knowledge Barriers related to resources available for ADL IADL limitations  Case Manager Clinical Goal(s):  [name] Over the next 60 days, patient will work with BSW to address needs related to ADL IADL limitations; obtaining [place]  . Over the next 60 days, BSW will collaborate with RN Care Manager to address care management and care coordination needs  Interventions:  . Patient interviewed and appropriate  assessments performed . Educated patient about eligibility and assessment process for [place]. Nash Dimmer with provider for completion of DMA-3051; PCS request form. Nash Dimmer with RN Care Manager and patient to establish an individualized plan of care   Patient Self Care Activities:  . Patient verbalizes understanding of plan to work with BSW to obtain [employer]  . Self administers medications as prescribed . Attends all scheduled provider appointments  Initial goal documentation     . 'My mom could use some help when she goes back to her house including help with transportation.." (pt-stated)       CARE PLAN ENTRY (see longitudinal plan of care for additional care plan information)   Current Barriers:  . Chronic Disease Management support, education, and care coordination needs related to HTN, HLD, DMII, CKD Stage 3, and Depression- Patient is currently living with her son [name] in [place] but he says she will eventually return to her home in [place]. He says patient hasn't driven in quite awhile due to weakness in her hand. He is interested in hearing about transportation options.  Case Manager Clinical Goal(s):  [name] Over the next 7-14 days, patient will work with BSW to address needs related to ADL IADL limitations and transportation concerns in patient with HTN, HLD, DMII, CKD Stage 3, and Depression  Interventions:  . Talked with patient about transportation needs.  Patient states that she has "a lot of grand kids" that can help her but would like to apply for SCAT in case needed. . Talked with patient about transportation benefit through [place]  [employer] and encouraged her to contact customer service to confirm benefit and number of rides . Completed SCAT application via phone and submitted for review.   Nash Dimmer with RNCM, Kelli Churn, for signature on SCAT application   Patient Self Care Activities:  . Patient verbalizes  understanding of plan to work with BSW and other health team members to address transportation needs . Self administers medications as prescribed . Attends all scheduled provider appointments . Calls pharmacy for medication refills . Performs ADL's independently . Performs IADL's independently . Calls provider office for new concerns or questions  Please see past updates related to this goal by clicking on the "Past Updates" button in the selected goal          The patient has been provided with contact information for the care management team and has been advised to call with any health related questions or concerns.       Ronn Melena, [place] Coordination Social Worker [place] [phone]

## 2019-05-25 NOTE — Telephone Encounter (Signed)
I agree

## 2019-05-25 NOTE — Patient Instructions (Signed)
Visit Information  Goals Addressed            This Visit's Progress   . "I could use some help around the house when I get back home" (pt-stated)       Current Barriers:  [name] Knowledge Barriers related to resources available for ADL IADL limitations  Case Manager Clinical Goal(s):  [name] Over the next 60 days, patient will work with BSW to address needs related to ADL IADL limitations; obtaining [place]  . Over the next 60 days, BSW will collaborate with RN Care Manager to address care management and care coordination needs  Interventions:  . Patient interviewed and appropriate assessments performed . Educated patient about eligibility and assessment process for [place]. Nash Dimmer with provider for completion of DMA-3051; PCS request form. Nash Dimmer with RN Care Manager and patient to establish an individualized plan of care   Patient Self Care Activities:  . Patient verbalizes understanding of plan to work with BSW to obtain [employer]  . Self administers medications as prescribed . Attends all scheduled provider appointments  Initial goal documentation     . 'My mom could use some help when she goes back to her house including help with transportation.." (pt-stated)       CARE PLAN ENTRY (see longitudinal plan of care for additional care plan information)   Current Barriers:  . Chronic Disease Management support, education, and care coordination needs related to HTN, HLD, DMII, CKD Stage 3, and Depression- Patient is currently living with her son [name] in [place] but he says she will eventually return to her home in [place]. He says patient hasn't driven in quite awhile due to weakness in her hand. He is interested in hearing about transportation options.  Case Manager Clinical Goal(s):  [name] Over the next 7-14 days, patient will work with BSW to address needs related to ADL IADL limitations and transportation concerns in  patient with HTN, HLD, DMII, CKD Stage 3, and Depression  Interventions:  . Talked with patient about transportation needs.  Patient states that she has "a lot of grand kids" that can help her but would like to apply for SCAT in case needed. . Talked with patient about transportation benefit through [employer] and encouraged her to contact customer service to confirm benefit and number of rides . Completed SCAT application via phone and submitted for review.   Nash Dimmer with RNCM, Kelli Churn, for signature on SCAT application   Patient Self Care Activities:  . Patient verbalizes understanding of plan to work with BSW and other health team members to address transportation needs . Self administers medications as prescribed . Attends all scheduled provider appointments . Calls pharmacy for medication refills . Performs ADL's independently . Performs IADL's independently . Calls provider office for new concerns or questions  Please see past updates related to this goal by clicking on the "Past Updates" button in the selected goal         Patient verbalizes understanding of instructions provided today.   The patient has been provided with contact information for the care management team and has been advised to call with any health related questions or concerns.     Ronn Melena, [place] Coordination Social Worker [place] [phone]

## 2019-05-25 NOTE — Progress Notes (Signed)
Internal Medicine Clinic Attending  CCM services provided by the care management provider and their documentation were discussed with Dr. Shan Levans. We reviewed the pertinent findings, urgent action items addressed by the resident and non-urgent items to be addressed by the PCP.  I agree with the assessment, diagnosis, and plan of care documented in the CCM and resident's note.  Larey Dresser, MD 05/25/2019

## 2019-05-25 NOTE — Telephone Encounter (Signed)
Received return call-verbal order given for speech therapy. Start date was on 05/17/2019-paperwork to follow for MD signature.Despina Hidden Cassady4/27/20211:21 PM

## 2019-05-25 NOTE — Progress Notes (Signed)
Internal Medicine Clinic Resident   I have personally reviewed this encounter including the documentation in this note and/or discussed this patient with the care management provider. I will address any urgent items identified by the care management provider and will communicate my actions to the patient's PCP. I have reviewed the patient's CCM visit with my supervising attending.  Brandon Vishal Sandlin, MD 05/25/2019    

## 2019-05-25 NOTE — Telephone Encounter (Signed)
NEED VO PER  SON[facility] AT [facility] [id number]

## 2019-05-26 ENCOUNTER — Ambulatory Visit (INDEPENDENT_AMBULATORY_CARE_PROVIDER_SITE_OTHER): Payer: Medicare Other | Admitting: Internal Medicine

## 2019-05-26 ENCOUNTER — Ambulatory Visit: Payer: Self-pay | Admitting: *Deleted

## 2019-05-26 ENCOUNTER — Ambulatory Visit: Payer: Medicare Other | Admitting: *Deleted

## 2019-05-26 ENCOUNTER — Other Ambulatory Visit: Payer: Self-pay

## 2019-05-26 VITALS — BP 135/71 | HR 108 | Temp 98.7°F | Ht 62.0 in | Wt 124.7 lb

## 2019-05-26 DIAGNOSIS — E1142 Type 2 diabetes mellitus with diabetic polyneuropathy: Secondary | ICD-10-CM

## 2019-05-26 DIAGNOSIS — N183 Chronic kidney disease, stage 3 unspecified: Secondary | ICD-10-CM

## 2019-05-26 DIAGNOSIS — F32A Depression, unspecified: Secondary | ICD-10-CM

## 2019-05-26 DIAGNOSIS — E039 Hypothyroidism, unspecified: Secondary | ICD-10-CM

## 2019-05-26 DIAGNOSIS — F329 Major depressive disorder, single episode, unspecified: Secondary | ICD-10-CM

## 2019-05-26 DIAGNOSIS — Z794 Long term (current) use of insulin: Secondary | ICD-10-CM

## 2019-05-26 DIAGNOSIS — I10 Essential (primary) hypertension: Secondary | ICD-10-CM

## 2019-05-26 LAB — GLUCOSE, CAPILLARY: Glucose-Capillary: 267 mg/dL — ABNORMAL HIGH (ref 70–99)

## 2019-05-26 IMAGING — MG 2D DIGITAL SCREENING UNILATERAL LEFT MAMMOGRAM WITH CAD AND ADJU
6 series · 6 of 14 positions shown · non-contrast
Comparison: Previous exam(s).

CLINICAL DATA: Screening.

EXAM:
2D DIGITAL SCREENING UNILATERAL LEFT MAMMOGRAM WITH CAD AND ADJUNCT
TOMO

[L CC synth-2D]
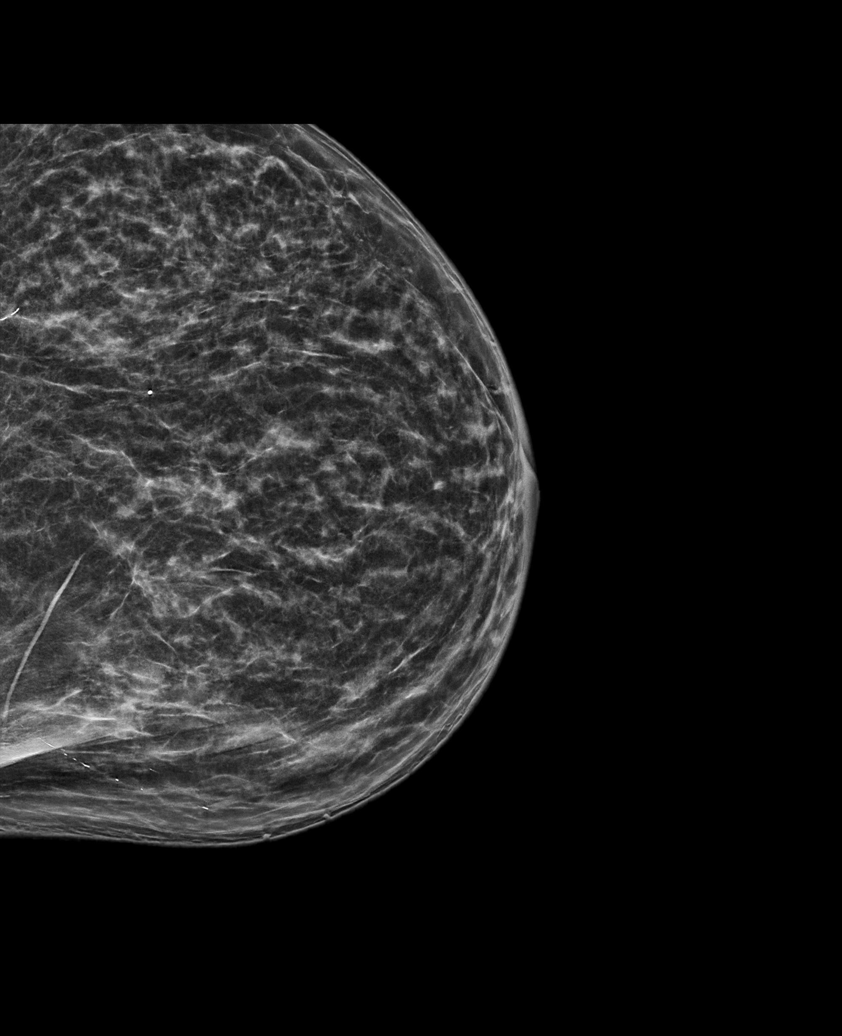

[L MLO synth-2D]
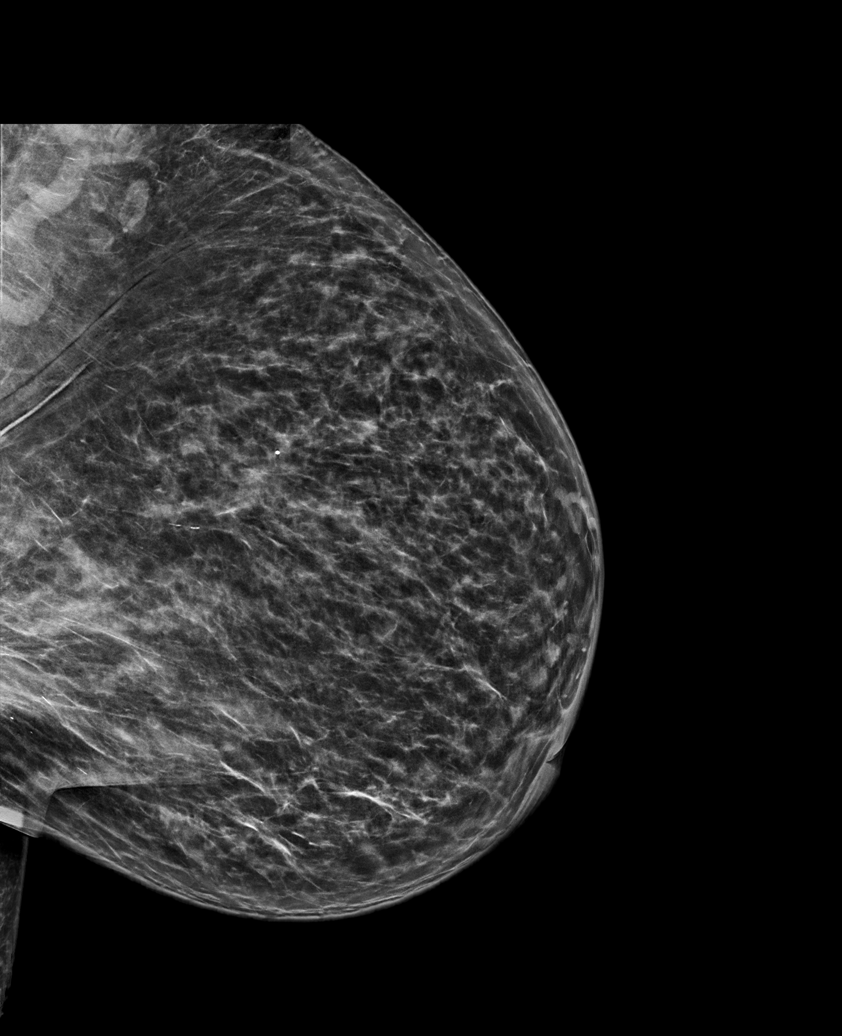

[L MLO]
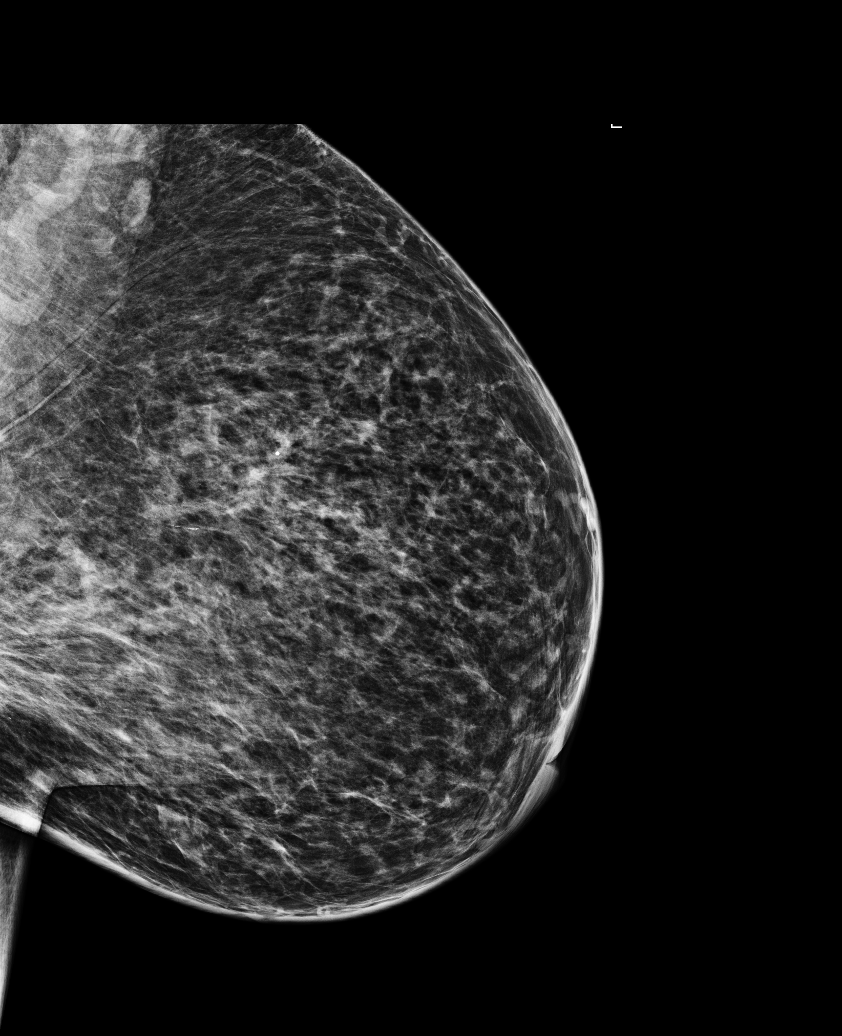

[L CC]
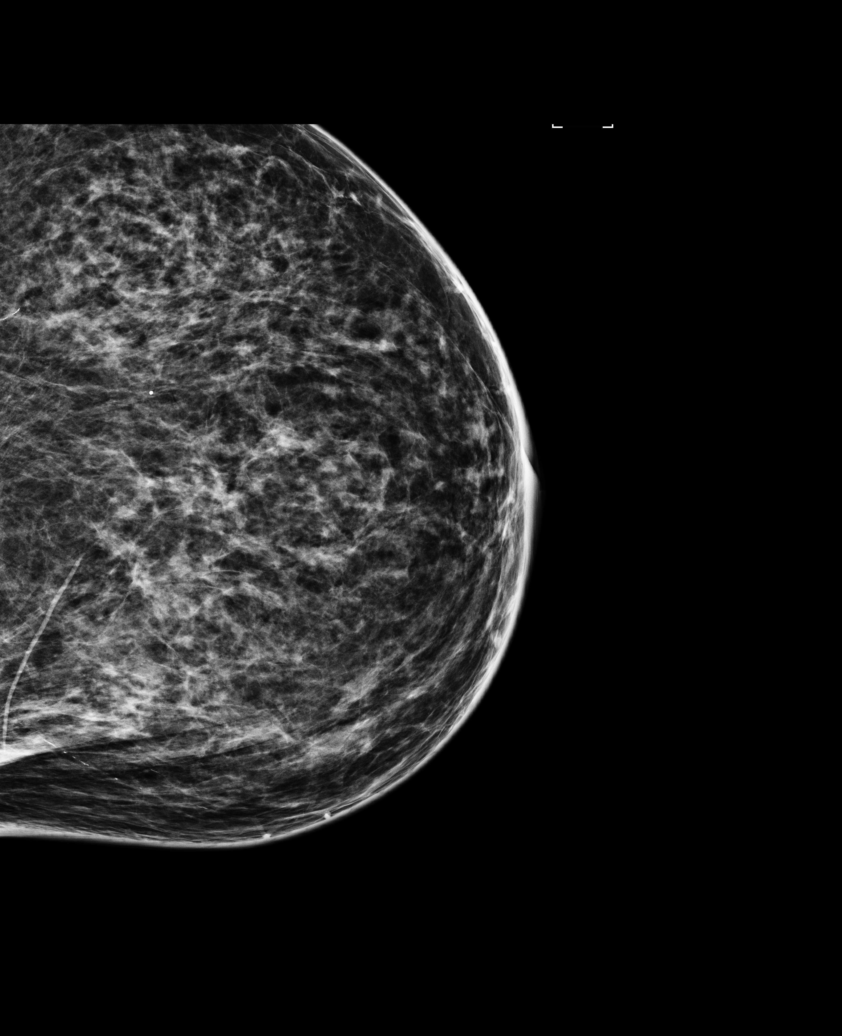

[L MLO tomo · tomo slice 37/72.0]
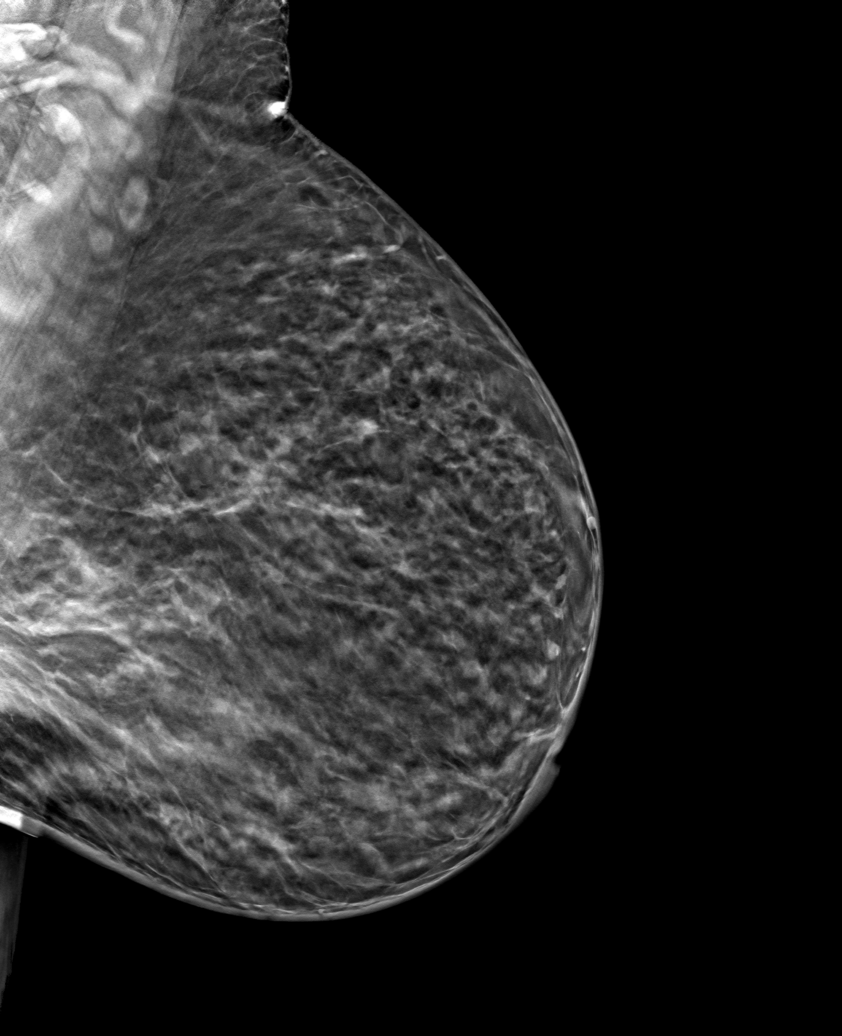

[L CC tomo · tomo slice 32/63.0]
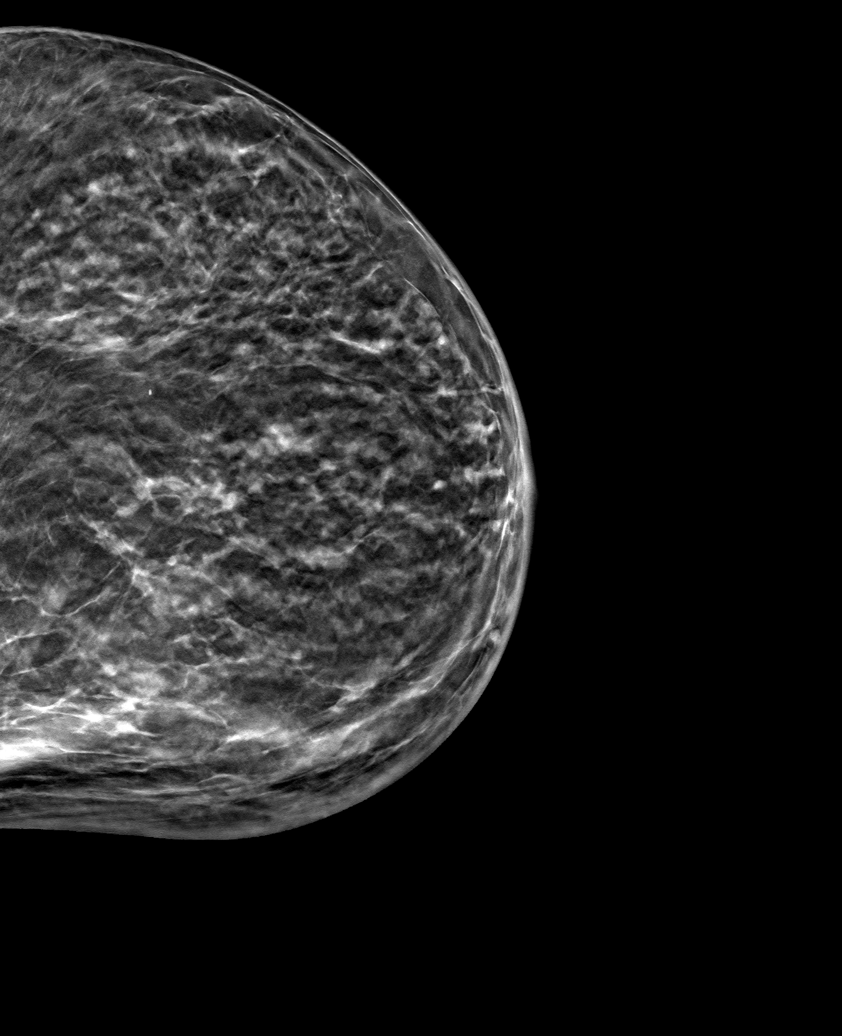

[6 of 14 positions shown; findings below may reference images not displayed]

ACR Breast Density Category b: There are scattered areas of
fibroglandular density.
FINDINGS: There are no findings suspicious for malignancy. Images were
processed with CAD.
IMPRESSION: No mammographic evidence of malignancy. A result letter of this
screening mammogram will be mailed directly to the patient.

RECOMMENDATION:
Screening mammogram in one year. (Code:ED-0-97R)

BI-RADS CATEGORY  1: Negative.

## 2019-05-26 NOTE — Chronic Care Management (AMB) (Signed)
  Care Management   Note  05/26/2019 Name: [name] MRN: [id number] DOB: 1950/09/24  Per Dr. Ladona Horns' request to assist with clarifying patient's current medications and pharmacy(s)  as patient and granddaughter were unable to provide this information during patient's clinic visit today, placed call to son [name]. He states he is unable to provide requested information until he is at home. States he can call this CCM RN back between 8:30-9:00 pm after he is home from work. Ensured he has this CCM RNCM business cell number.  Telephone follow up appointment with care management team member scheduled for: 05/26/19 at 9:00 pm.  Kelli Churn RN, CCM, [facility] RN Care Manager [phone]

## 2019-05-26 NOTE — Patient Instructions (Signed)
Visit Information Thank you [name] for assisting me in clarifying your Mom's current medications. I will ask the doctor to call refills to the [facility] in [place].   Goals Addressed            This Visit's Progress     Patient Stated   . " I don't know why my blood sugar was so high when they took me to the hospital in March. I think I was taking my medications like I'm suppose to." (pt-stated)       CARE PLAN ENTRY (see longitudinal plan of care for additional care plan information)  Current Barriers:  . Chronic Disease Management support, education, and care coordination needs related to HTN, HLD, DMII, CKD Stage 3, and Depression- Clinical Goal(s) related to  HTN, HLD, DMII, CKD Stage 3, and Depression- son [name] called this CCM RN at 9:35 pm to assist with medication reconciliation. He says patient's medications were coming from [place] because she was in [facility]. Prior to her nursing home stay she used [facility] on [address] for all medication refills. He says patient will be living with him until the end of May and he would prefer her medication refills be called to the [facility] on [address] in [place]  Over the next 7-14 days, patient will:  . Work with the care management team to address educational, disease management, and care coordination needs  . Begin or continue self health monitoring activities as directed today Measure and record CBG (blood glucose) 2 times daily and take medications as prescribed . Call provider office for new or worsened signs and symptoms HTN, HLD, DMII, CKD Stage 3, and Depression . Call care management team with questions or concerns . Verbalize basic understanding of patient centered plan of care established today  Interventions related to HTN, HLD, DMII, CKD Stage 3, and Depression . Reviewed in detail patient's current medication list and need for refills . Requested [name] call this CCM RN when patient is no longer receiving home  health services.  Nash Dimmer with appropriate clinical care team members regarding patient needs- notified provider of results of medication reconciliation and medication refill needs and preferred pharmacy.  Patient Self Care Activities related to HTN, HLD, DMII, CKD Stage 3, and Depression Patient is unable to independently self-manage chronic health conditions  Please see past updates related to this goal by clicking on the "Past Updates" button in the selected goal         The patient verbalized understanding of instructions provided today and declined a print copy of patient instruction materials.   The care management team will reach out to the patient again over the next 7-14 days.   Kelli Churn RN, CCM, [facility] RN Care Manager [phone]

## 2019-05-26 NOTE — Progress Notes (Signed)
Internal Medicine Clinic Attending  CCM services provided by the care management provider and their documentation were discussed with Dr. Winfrey. We reviewed the pertinent findings, urgent action items addressed by the resident and non-urgent items to be addressed by the PCP.  I agree with the assessment, diagnosis, and plan of care documented in the CCM and resident's note.  Rene Sizelove, MD 05/26/2019  

## 2019-05-26 NOTE — Chronic Care Management (AMB) (Addendum)
Chronic Care Management   Follow Up Note   05/26/2019 Name: [name] MRN: [id number] DOB: December 01, 1950  Referred by: Aldine Contes, MD Reason for referral : Chronic Care Management (DM, HTN, CKD)   [name] is a 69 y.o. year old female who is a primary care patient of Aldine Contes, MD. The CCM team was consulted for assistance with chronic disease management and care coordination needs.    Review of patient status, including review of consultants reports, relevant laboratory and other test results, and collaboration with appropriate care team members and the patient's provider was performed as part of comprehensive patient evaluation and provision of chronic care management services.    SDOH (Social Determinants of Health) assessments performed: No See Care Plan activities for detailed interventions related to [facility])     Outpatient Encounter Medications as of 05/26/2019  Medication Sig Note  . ACCU-CHEK AVIVA PLUS test strip 1 each by Other route as needed for other. Use as instructed 05/26/2019: Currently using Accu-Chek Aviva Plus strips- she is out and needs refill, she is checking her blood sugar twice daily  . Acetaminophen 325 MG CAPS Take 325 mg by mouth every 6 (six) hours as needed (for pain).   . ALPHAGAN P 0.1 % SOLN Place 1 drop into both eyes 3 (three) times daily. 04/17/2019: No record of this in past 6 months per external pharmacy records  . busPIRone (BUSPAR) 5 MG tablet Take 1 tablet (5 mg total) by mouth 2 (two) times daily. 05/26/2019: She is out of med and needs refill  . Cholecalciferol (VITAMIN D3) 2000 units TABS Take 2,000 Units at bedtime by mouth.    . cycloSPORINE (RESTASIS) 0.05 % ophthalmic emulsion Restasis MultiDose 0.05 % eye drops  INT 1 GTT IN [facility] EYE BID 04/17/2019: No record of this in past 6 months per external pharmacy records  . Insulin Pen Needle (BD PEN NEEDLE NANO U/F) 32G X 4 MM MISC USE AS DIRECTED   . ketorolac (ACULAR) 0.5 %  ophthalmic solution Place 1 drop into both eyes 3 (three) times daily. 04/17/2019: No record of this in past 6 months per external pharmacy records  . levothyroxine (SYNTHROID) 100 MCG tablet Take 1 tablet (100 mcg total) by mouth daily before breakfast. 05/26/2019: She is out of med and needs refill  . losartan (COZAAR) 25 MG tablet Take 12.5 mg by mouth daily. 05/26/2019: Her son [name] says he thinks this medication was started while patient was in [facility]- she has been out of it for a week so needs refill if she is to continue it  . metFORMIN (GLUCOPHAGE) 1000 MG tablet Take 1 tablet (1,000 mg total) by mouth 2 (two) times daily with a meal. 04/17/2019: #180 filled 12/07/2018 [facility] per external pharmacy records  . Olopatadine HCl (PATADAY) 0.2 % SOLN Place 1 drop into both eyes daily as needed (dry eyes).  04/17/2019: No record of this in past 6 months per external pharmacy records  . rosuvastatin (CRESTOR) 20 MG tablet Take 1 tablet (20 mg total) by mouth daily. TAKE 1 TABLET BY MOUTH EVERY DAY 05/26/2019: She is out and needs refill  . Semaglutide,0.25 or 0.5MG/DOS, (OZEMPIC, 0.25 OR 0.5 MG/DOSE,) 2 MG/1.5ML SOPN Inject 0.5 mg into the skin once a week. 05/26/2019: She is out and needs refill  . valACYclovir (VALTREX) 500 MG tablet TK 1 T PO QD 05/26/2019: She is out and needs refill if she is to continue this medication  . venlafaxine XR (EFFEXOR-XR) 150  MG 24 hr capsule TAKE 1 CAPSULE BY MOUTH DAILY WITH BREAKFAST 05/26/2019: She is out and needs refill   . Besifloxacin HCl (BESIVANCE) 0.6 % SUSP Besivance 0.6 % eye drops,suspension  INSTILL 1 DROP INTO THE LEFT EYE QID X 2 DAYS AFTER EACH MONTHLY EYE INJECTION 04/17/2019: No record of this in past 6 months per external pharmacy records  . Blood Glucose Monitoring Suppl (ONETOUCH VERIO) w/Device KIT Check blood sugar up to 5 times a day (Patient not taking: Reported on 05/26/2019)   . calcium citrate-vitamin D (CITRACAL+D) 315-200 MG-UNIT tablet Take  1 tablet by mouth 2 (two) times daily. (Patient not taking: Reported on 11/20/2017)   . ciprofloxacin (CILOXAN) 0.3 % ophthalmic ointment  04/17/2019: No record of this in past 6 months per external pharmacy records  . erythromycin ophthalmic ointment APPLY A SMALL AMOUNT INTO LEFT EYE TID 04/17/2019: No record of this in past 6 months per external pharmacy records  . glucose 4 GM chewable tablet Chew 4 tablets (16 g total) by mouth as needed for low blood sugar. 04/17/2019: No record of this in past 6 months per external pharmacy records  . glucose blood (ONETOUCH VERIO) test strip CHECK BLOOD SUGAR before meals and bedtime   . insulin degludec (TRESIBA FLEXTOUCH) 100 UNIT/ML SOPN FlexTouch Pen Inject 0.16 mLs (16 Units total) into the skin at bedtime. (Patient not taking: Reported on 05/26/2019) 04/17/2019: No record of this in past 6 months per external pharmacy records  . insulin detemir (LEVEMIR) 100 UNIT/ML injection Inject 60 Units into the skin every morning. Not Taking  . insulin lispro (HUMALOG KWIKPEN) 100 UNIT/ML KwikPen Inject 0.08-0.1 mLs (8-10 Units total) into the skin 3 (three) times daily. (Patient not taking: Reported on 05/24/2019) 04/17/2019: 30 mls filled 11/10/2018 [facility] per external pharmacy records  . levETIRAcetam (KEPPRA) 500 MG tablet Take 1 tablet (500 mg total) by mouth 2 (two) times daily for 3 days. Not taking- course completed  . Melatonin 1 MG TABS Take 1 tablet (1 mg total) by mouth daily. (Patient not taking: Reported on 05/26/2019) 04/17/2019: No record of this in past 6 months per external pharmacy records  . Nutritional Supplements (GLUCERNA SNACK SHAKE) LIQD Take 1 Dose by mouth 3 (three) times daily as needed. (Patient not taking: Reported on 05/26/2019) 05/26/2019: She is drinking vanilla ensure instead of Glucerna  . OneTouch Delica Lancets 93A MISC Use to check blood sugar before meals and at bedtime   . Polyethyl Glycol-Propyl Glycol (SYSTANE) 0.4-0.3 % GEL  ophthalmic gel Place 1 application daily as needed into both eyes (dry eyes/irritation).    . prednisoLONE acetate (PRED FORTE) 1 % ophthalmic suspension  04/17/2019: No record of this in past 6 months per external pharmacy records   No facility-administered encounter medications on file as of 05/26/2019.     Objective:  Lab Results  Component Value Date   HGBA1C 14.4 (H) 04/18/2019   HGBA1C 9.0 (A) 12/08/2018   HGBA1C 7.7 (A) 08/25/2018   Lab Results  Component Value Date   MICROALBUR 1.0 12/09/2013   LDLCALC 154 (H) 10/13/2018   CREATININE 0.90 04/19/2019    Goals Addressed            This Visit's Progress     Patient Stated   . " I don't know why my blood sugar was so high when they took me to the hospital in March. I think I was taking my medications like I'm suppose to." (pt-stated)  CARE PLAN ENTRY (see longitudinal plan of care for additional care plan information)  Current Barriers:  . Chronic Disease Management support, education, and care coordination needs related to HTN, HLD, DMII, CKD Stage 3, and Depression- Clinical Goal(s) related to  HTN, HLD, DMII, CKD Stage 3, and Depression- son [name] called this CCM RN at 9:35 pm to assist with medication reconciliation. He says patient's medications were coming from [place] because she was in [facility]. Prior to her nursing home stay she used [facility] on [address] for all medication refills. He says patient will be living with him until the end of May and he would prefer her medication refills be called to the [facility] on [address] in [place]  Over the next 7-14 days, patient will:  . Work with the care management team to address educational, disease management, and care coordination needs  . Begin or continue self health monitoring activities as directed today Measure and record CBG (blood glucose) 2 times daily and take medications as prescribed . Call provider office for new or worsened signs and  symptoms HTN, HLD, DMII, CKD Stage 3, and Depression . Call care management team with questions or concerns . Verbalize basic understanding of patient centered plan of care established today  Interventions related to HTN, HLD, DMII, CKD Stage 3, and Depression . Reviewed in detail patient's current medication list and need for refills . Requested [name] call this CCM RN when patient is no longer receiving home health services.  Nash Dimmer with appropriate clinical care team members regarding patient needs- notified provider of results of medication reconciliation and medication refill needs and preferred pharmacy. Will seek resources to provide Vanilla Glucerna to patient at next clinic visit.   Patient Self Care Activities related to HTN, HLD, DMII, CKD Stage 3, and Depression Patient is unable to independently self-manage chronic health conditions  Please see past updates related to this goal by clicking on the "Past Updates" button in the selected goal          Plan:   The care management team will reach out to the patient again over the next 7-14 days.    Kelli Churn RN, CCM, [facility] RN Care Manager [phone]

## 2019-05-26 NOTE — Progress Notes (Signed)
   CC: diabetes follow-up  HPI:  [name] is a 69 y.o. F with significant PMH as outlined below, who presents for follow-up of her diabetes. Please see problem-based charting for additional information.   Past Medical History:  Diagnosis Date  . Arthritis   . Bilateral hip pain 02/24/2015  . Borderline glaucoma of both eyes   . Depression   . Diabetic polyneuropathy ([place])   . Diverticulosis of colon   . Dizziness 12/05/2017  . Dry eyes, bilateral   . Feeling of incomplete bladder emptying   . Frequent falls 01/08/2018  . History of breast cancer oncologist-  dr Waymon Budge-- per lov note no recurrence   dx 04/ 1999 --- Stage 3B-- s/p  chemotherapy then right mastectomy then concurrent chemoradiation therapy  . History of urinary retention    01/ 2018  . Hydronephrosis of right kidney   . Hyperlipidemia   . Hypertension   . Hypokalemia 12/09/2016  . Hypomagnesemia 12/09/2016  . Hypothyroidism   . Insulin dependent type 2 diabetes mellitus [facility])    endocrinologist-  dr Cruzita Lederer---  last A1c 8.7 on 02-20-2016  . Lymphedema of upper extremity    right  . Macular degeneration, left eye    followed by dr Zigmund Daniel  . Renal insufficiency   . Urgency of urination    Review of Systems:  Review of Systems  Constitutional: Negative for chills, diaphoresis, fever, malaise/fatigue and weight loss.  Respiratory: Negative for cough and shortness of breath.   Cardiovascular: Negative for chest pain and palpitations.  Gastrointestinal: Negative for abdominal pain, constipation, diarrhea, nausea and vomiting.  Genitourinary: Negative for dysuria and frequency.  Neurological: Negative for dizziness, weakness and headaches.  Endo/Heme/Allergies: Negative for polydipsia.   Physical Exam:  Vitals:   05/26/19 1447  BP: 135/71  Pulse: (!) 108  Temp: 98.7 F (37.1 C)  TempSrc: Oral  SpO2: 100%  Weight: 124 lb 11.2 oz (56.6 kg)  Height: 5\' 2"  (1.575 m)   Physical Exam Vitals  and nursing note reviewed.  Constitutional:      General: She is not in acute distress.    Appearance: She is normal weight. She is ill-appearing (chronically).  HENT:     Mouth/Throat:     Mouth: Mucous membranes are moist.  Cardiovascular:     Rate and Rhythm: Normal rate and regular rhythm.     Heart sounds: Normal heart sounds.  Pulmonary:     Effort: Pulmonary effort is normal. No respiratory distress.     Breath sounds: Normal breath sounds. No wheezing, rhonchi or rales.  Abdominal:     General: Abdomen is flat. Bowel sounds are normal. There is no distension.     Palpations: Abdomen is soft.     Tenderness: There is no abdominal tenderness. There is no guarding or rebound.  Neurological:     Mental Status: She is alert.  Psychiatric:        Mood and Affect: Mood normal.    Assessment & Plan:   See Encounters Tab for problem based charting.  Patient discussed with Dr. Dareen Piano

## 2019-05-26 NOTE — Patient Instructions (Addendum)
[name],  It was nice meeting you today! I am glad you are feeling well.   Please follow-up with Dr. Dareen Piano next week on Tuesday May 4th at 10:15AM. We will get some lab work today that Dr. Dareen Piano will go over with you next week.  Bring your medications with you to that appointment in addition to the meter you are using to check your blood sugars.  Thank you for letting us be a part of your care!

## 2019-05-27 DIAGNOSIS — N189 Chronic kidney disease, unspecified: Secondary | ICD-10-CM | POA: Diagnosis not present

## 2019-05-27 DIAGNOSIS — R1312 Dysphagia, oropharyngeal phase: Secondary | ICD-10-CM | POA: Diagnosis not present

## 2019-05-27 DIAGNOSIS — E1165 Type 2 diabetes mellitus with hyperglycemia: Secondary | ICD-10-CM | POA: Diagnosis not present

## 2019-05-27 DIAGNOSIS — I972 Postmastectomy lymphedema syndrome: Secondary | ICD-10-CM | POA: Diagnosis not present

## 2019-05-27 DIAGNOSIS — E1142 Type 2 diabetes mellitus with diabetic polyneuropathy: Secondary | ICD-10-CM | POA: Diagnosis not present

## 2019-05-27 DIAGNOSIS — Z9011 Acquired absence of right breast and nipple: Secondary | ICD-10-CM | POA: Diagnosis not present

## 2019-05-27 DIAGNOSIS — M1990 Unspecified osteoarthritis, unspecified site: Secondary | ICD-10-CM | POA: Diagnosis not present

## 2019-05-27 DIAGNOSIS — I129 Hypertensive chronic kidney disease with stage 1 through stage 4 chronic kidney disease, or unspecified chronic kidney disease: Secondary | ICD-10-CM | POA: Diagnosis not present

## 2019-05-27 DIAGNOSIS — E1122 Type 2 diabetes mellitus with diabetic chronic kidney disease: Secondary | ICD-10-CM | POA: Diagnosis not present

## 2019-05-27 DIAGNOSIS — H3530 Unspecified macular degeneration: Secondary | ICD-10-CM | POA: Diagnosis not present

## 2019-05-27 DIAGNOSIS — G9340 Encephalopathy, unspecified: Secondary | ICD-10-CM | POA: Diagnosis not present

## 2019-05-27 DIAGNOSIS — Z794 Long term (current) use of insulin: Secondary | ICD-10-CM | POA: Diagnosis not present

## 2019-05-27 DIAGNOSIS — H409 Unspecified glaucoma: Secondary | ICD-10-CM | POA: Diagnosis not present

## 2019-05-27 DIAGNOSIS — Z79899 Other long term (current) drug therapy: Secondary | ICD-10-CM | POA: Diagnosis not present

## 2019-05-27 DIAGNOSIS — E039 Hypothyroidism, unspecified: Secondary | ICD-10-CM | POA: Diagnosis not present

## 2019-05-27 LAB — BMP8+ANION GAP
Anion Gap: 21 mmol/L — ABNORMAL HIGH (ref 10.0–18.0)
BUN/Creatinine Ratio: 18 (ref 12–28)
BUN: 21 mg/dL (ref 8–27)
CO2: 20 mmol/L (ref 20–29)
Calcium: 10.5 mg/dL — ABNORMAL HIGH (ref 8.7–10.3)
Chloride: 98 mmol/L (ref 96–106)
Creatinine, Ser: 1.2 mg/dL — ABNORMAL HIGH (ref 0.57–1.00)
GFR calc Af Amer: 54 mL/min/{1.73_m2} — ABNORMAL LOW (ref >59)
GFR calc non Af Amer: 47 mL/min/{1.73_m2} — ABNORMAL LOW (ref >59)
Glucose: 234 mg/dL — ABNORMAL HIGH (ref 65–99)
Potassium: 4.1 mmol/L (ref 3.5–5.2)
Sodium: 139 mmol/L (ref 134–144)

## 2019-05-27 LAB — CBC
Hematocrit: 37.6 % (ref 34.0–46.6)
Hemoglobin: 12.5 g/dL (ref 11.1–15.9)
MCH: 31.2 pg (ref 26.6–33.0)
MCHC: 33.2 g/dL (ref 31.5–35.7)
MCV: 94 fL (ref 79–97)
Platelets: 293 10*3/uL (ref 150–450)
RBC: 4.01 x10E6/uL (ref 3.77–5.28)
RDW: 13.6 % (ref 11.7–15.4)
WBC: 10.1 10*3/uL (ref 3.4–10.8)

## 2019-05-27 LAB — TSH: TSH: 0.157 u[IU]/mL — ABNORMAL LOW (ref 0.450–4.500)

## 2019-05-27 LAB — T4, FREE: Free T4: 1.31 ng/dL (ref 0.82–1.77)

## 2019-05-27 MED ORDER — ACCU-CHEK AVIVA PLUS VI STRP: 1.0000 | ORAL_STRIP | 0 refills | Status: DC | PRN

## 2019-05-27 MED ORDER — BUSPIRONE HCL 5 MG PO TABS: 5.0000 mg | ORAL_TABLET | Freq: Two times a day (BID) | ORAL | 0 refills | Status: DC

## 2019-05-27 MED ORDER — ROSUVASTATIN CALCIUM 20 MG PO TABS: 20.0000 mg | ORAL_TABLET | Freq: Every day | ORAL | 0 refills | Status: DC

## 2019-05-27 MED ORDER — LEVOTHYROXINE SODIUM 100 MCG PO TABS: 100.0000 ug | ORAL_TABLET | Freq: Every day | ORAL | 0 refills | Status: DC

## 2019-05-27 MED ORDER — METFORMIN HCL 1000 MG PO TABS: 1000.0000 mg | ORAL_TABLET | Freq: Two times a day (BID) | ORAL | 0 refills | Status: DC

## 2019-05-27 MED ORDER — OZEMPIC (0.25 OR 0.5 MG/DOSE) 2 MG/1.5ML ~~LOC~~ SOPN: 0.5000 mg | PEN_INJECTOR | SUBCUTANEOUS | 0 refills | Status: DC

## 2019-05-27 MED ORDER — VENLAFAXINE HCL ER 150 MG PO CP24: ORAL_CAPSULE | ORAL | 0 refills | Status: DC

## 2019-05-27 NOTE — Progress Notes (Signed)
Internal Medicine Clinic Resident   I have personally reviewed this encounter including the documentation in this note and/or discussed this patient with the care management provider. I will address any urgent items identified by the care management provider and will communicate my actions to the patient's PCP. I have reviewed the patient's CCM visit with my supervising attending.  Brandon Rovena Hearld, MD 05/27/2019    

## 2019-05-27 NOTE — Progress Notes (Signed)
Internal Medicine Clinic Resident   I have personally reviewed this encounter including the documentation in this note and/or discussed this patient with the care management provider. I will address any urgent items identified by the care management provider and will communicate my actions to the patient's PCP. I have reviewed the patient's CCM visit with my supervising attending.  Brandon Shante Archambeault, MD 05/27/2019    

## 2019-05-28 ENCOUNTER — Other Ambulatory Visit: Payer: Self-pay | Admitting: *Deleted

## 2019-05-28 DIAGNOSIS — Z9011 Acquired absence of right breast and nipple: Secondary | ICD-10-CM | POA: Diagnosis not present

## 2019-05-28 DIAGNOSIS — E1122 Type 2 diabetes mellitus with diabetic chronic kidney disease: Secondary | ICD-10-CM | POA: Diagnosis not present

## 2019-05-28 DIAGNOSIS — E1142 Type 2 diabetes mellitus with diabetic polyneuropathy: Secondary | ICD-10-CM

## 2019-05-28 DIAGNOSIS — I129 Hypertensive chronic kidney disease with stage 1 through stage 4 chronic kidney disease, or unspecified chronic kidney disease: Secondary | ICD-10-CM | POA: Diagnosis not present

## 2019-05-28 DIAGNOSIS — E1165 Type 2 diabetes mellitus with hyperglycemia: Secondary | ICD-10-CM | POA: Diagnosis not present

## 2019-05-28 DIAGNOSIS — H3530 Unspecified macular degeneration: Secondary | ICD-10-CM | POA: Diagnosis not present

## 2019-05-28 DIAGNOSIS — R1312 Dysphagia, oropharyngeal phase: Secondary | ICD-10-CM | POA: Diagnosis not present

## 2019-05-28 DIAGNOSIS — N189 Chronic kidney disease, unspecified: Secondary | ICD-10-CM | POA: Diagnosis not present

## 2019-05-28 DIAGNOSIS — E039 Hypothyroidism, unspecified: Secondary | ICD-10-CM | POA: Diagnosis not present

## 2019-05-28 DIAGNOSIS — Z79899 Other long term (current) drug therapy: Secondary | ICD-10-CM | POA: Diagnosis not present

## 2019-05-28 DIAGNOSIS — H409 Unspecified glaucoma: Secondary | ICD-10-CM | POA: Diagnosis not present

## 2019-05-28 DIAGNOSIS — I972 Postmastectomy lymphedema syndrome: Secondary | ICD-10-CM | POA: Diagnosis not present

## 2019-05-28 DIAGNOSIS — G9340 Encephalopathy, unspecified: Secondary | ICD-10-CM | POA: Diagnosis not present

## 2019-05-28 DIAGNOSIS — Z794 Long term (current) use of insulin: Secondary | ICD-10-CM | POA: Diagnosis not present

## 2019-05-28 DIAGNOSIS — M1990 Unspecified osteoarthritis, unspecified site: Secondary | ICD-10-CM | POA: Diagnosis not present

## 2019-05-28 NOTE — Telephone Encounter (Signed)
Pt's insurance plan requires specific direction; not "test as instructed". Please re-send with frequency. Thanks

## 2019-05-31 ENCOUNTER — Ambulatory Visit: Payer: Medicare Other | Admitting: *Deleted

## 2019-05-31 DIAGNOSIS — I972 Postmastectomy lymphedema syndrome: Secondary | ICD-10-CM | POA: Diagnosis not present

## 2019-05-31 DIAGNOSIS — I10 Essential (primary) hypertension: Secondary | ICD-10-CM

## 2019-05-31 DIAGNOSIS — H3530 Unspecified macular degeneration: Secondary | ICD-10-CM | POA: Diagnosis not present

## 2019-05-31 DIAGNOSIS — I129 Hypertensive chronic kidney disease with stage 1 through stage 4 chronic kidney disease, or unspecified chronic kidney disease: Secondary | ICD-10-CM | POA: Diagnosis not present

## 2019-05-31 DIAGNOSIS — E1165 Type 2 diabetes mellitus with hyperglycemia: Secondary | ICD-10-CM | POA: Diagnosis not present

## 2019-05-31 DIAGNOSIS — E1122 Type 2 diabetes mellitus with diabetic chronic kidney disease: Secondary | ICD-10-CM | POA: Diagnosis not present

## 2019-05-31 DIAGNOSIS — Z794 Long term (current) use of insulin: Secondary | ICD-10-CM

## 2019-05-31 DIAGNOSIS — H409 Unspecified glaucoma: Secondary | ICD-10-CM | POA: Diagnosis not present

## 2019-05-31 DIAGNOSIS — N189 Chronic kidney disease, unspecified: Secondary | ICD-10-CM | POA: Diagnosis not present

## 2019-05-31 DIAGNOSIS — R1312 Dysphagia, oropharyngeal phase: Secondary | ICD-10-CM | POA: Diagnosis not present

## 2019-05-31 DIAGNOSIS — E1142 Type 2 diabetes mellitus with diabetic polyneuropathy: Secondary | ICD-10-CM | POA: Diagnosis not present

## 2019-05-31 DIAGNOSIS — E039 Hypothyroidism, unspecified: Secondary | ICD-10-CM | POA: Diagnosis not present

## 2019-05-31 DIAGNOSIS — G9340 Encephalopathy, unspecified: Secondary | ICD-10-CM | POA: Diagnosis not present

## 2019-05-31 DIAGNOSIS — M1990 Unspecified osteoarthritis, unspecified site: Secondary | ICD-10-CM | POA: Diagnosis not present

## 2019-05-31 DIAGNOSIS — Z79899 Other long term (current) drug therapy: Secondary | ICD-10-CM | POA: Diagnosis not present

## 2019-05-31 DIAGNOSIS — Z9011 Acquired absence of right breast and nipple: Secondary | ICD-10-CM | POA: Diagnosis not present

## 2019-05-31 MED ORDER — ACCU-CHEK AVIVA PLUS VI STRP: ORAL_STRIP | 0 refills | Status: DC

## 2019-05-31 NOTE — Patient Instructions (Signed)
Visit Information Please bring your glucose readings and all of your medications to your appointment tomorrow. Goals Addressed            This Visit's Progress     Patient Stated   . " I don't know why my blood sugar was so high when they took me to the hospital in March. I think I was taking my medications like I'm suppose to." (pt-stated)       CARE PLAN ENTRY (see longitudinal plan of care for additional care plan information)  Current Barriers:  . Chronic Disease Management support, education, and care coordination needs related to HTN, HLD, DMII, CKD Stage 3, and Depression- Clinical Goal(s) related to  HTN, HLD, DMII, CKD Stage 3, and Depression- patient reports her blood sugar was 123 today but she could not recall any other readings from the weekend, she asked if her strips were called in to the pharmacy in [place] .  Over the next 7-14 days, patient will:  . Work with the care management team to address educational, disease management, and care coordination needs  . Begin or continue self health monitoring activities as directed today Measure and record CBG (blood glucose) 2 times daily and take medications as prescribed . Call provider office for new or worsened signs and symptoms HTN, HLD, DMII, CKD Stage 3, and Depression . Call care management team with questions or concerns . Verbalize basic understanding of patient centered plan of care established today  Interventions related to HTN, HLD, DMII, CKD Stage 3, and Depression . Assessed patient's DM self management strategies and reviewed blood glucose home readings-provided positive reinforcement for the blood sugar reading provided . Verified that DM test strips were ordered at [facility] in [place] . Collaborated with appropriate clinical care team members regarding patient needs- notified provider of results of medication reconciliation and medication refill needs and preferred pharmacy. . Advised patient samples of   Vanilla and [place] will be given to her at her clinic appointment tomorrow  Patient Self Care Activities related to HTN, HLD, DMII, CKD Stage 3, and Depression Patient is unable to independently self-manage chronic health conditions  Please see past updates related to this goal by clicking on the "Past Updates" button in the selected goal         The patient verbalized understanding of instructions provided today and declined a print copy of patient instruction materials.   The care management team will reach out to the patient again over the next 30 days.   Kelli Churn RN, CCM, [facility] RN Care Manager [phone]

## 2019-05-31 NOTE — Progress Notes (Signed)
Internal Medicine Clinic Attending  CCM services provided by the care management provider and their documentation were discussed with Dr. Maricela Bo. We reviewed the pertinent findings, urgent action items addressed by the resident and non-urgent items to be addressed by the PCP.  I agree with the assessment, diagnosis, and plan of care documented in the CCM and resident's note.  Aldine Contes, MD 05/31/2019

## 2019-05-31 NOTE — Progress Notes (Signed)
Internal Medicine Clinic Resident   I have personally reviewed this encounter including the documentation in this note and/or discussed this patient with the care management provider. I will address any urgent items identified by the care management provider and will communicate my actions to the patient's PCP. I have reviewed the patient's CCM visit with my supervising attending.  Susette Seminara, MD 05/31/2019    

## 2019-05-31 NOTE — Progress Notes (Signed)
Internal Medicine Clinic Attending  CCM services provided by the care management provider and their documentation were discussed with Dr. Winfrey. We reviewed the pertinent findings, urgent action items addressed by the resident and non-urgent items to be addressed by the PCP.  I agree with the assessment, diagnosis, and plan of care documented in the CCM and resident's note.  Davetta Olliff, MD 05/31/2019  

## 2019-05-31 NOTE — Progress Notes (Signed)
Internal Medicine Clinic Attending  CCM services provided by the care management provider and their documentation were discussed with Dr. Winfrey. We reviewed the pertinent findings, urgent action items addressed by the resident and non-urgent items to be addressed by the PCP.  I agree with the assessment, diagnosis, and plan of care documented in the CCM and resident's note.  Tawana Pasch, MD 05/31/2019  

## 2019-05-31 NOTE — Chronic Care Management (AMB) (Signed)
Chronic Care Management   Follow Up Note   05/31/2019 Name: [name] MRN: [id number] DOB: January 03, 1951  Referred by: Christy Contes, MD Reason for referral : Chronic Care Management (DM, HTN, CKD)   [name] is a 69 y.o. year old female who is a primary care patient of Christy Contes, MD. The CCM team was consulted for assistance with chronic disease management and care coordination needs.    Review of patient status, including review of consultants reports, relevant laboratory and other test results, and collaboration with appropriate care team members and the patient's provider was performed as part of comprehensive patient evaluation and provision of chronic care management services.    SDOH (Social Determinants of Health) assessments performed: No See Care Plan activities for detailed interventions related to [facility])     Outpatient Encounter Medications as of 05/31/2019  Medication Sig Note  . ACCU-CHEK AVIVA PLUS test strip Test blood sugars 3 times a day before meals and at bedtime   . Acetaminophen 325 MG CAPS Take 325 mg by mouth every 6 (six) hours as needed (for pain).   . ALPHAGAN P 0.1 % SOLN Place 1 drop into both eyes 3 (three) times daily. 04/17/2019: No record of this in past 6 months per external pharmacy records  . Besifloxacin HCl (BESIVANCE) 0.6 % SUSP Besivance 0.6 % eye drops,suspension  INSTILL 1 DROP INTO THE LEFT EYE QID X 2 DAYS AFTER EACH MONTHLY EYE INJECTION 04/17/2019: No record of this in past 6 months per external pharmacy records  . Blood Glucose Monitoring Suppl (ONETOUCH VERIO) w/Device KIT Check blood sugar up to 5 times a day (Patient not taking: Reported on 05/26/2019)   . busPIRone (BUSPAR) 5 MG tablet Take 1 tablet (5 mg total) by mouth 2 (two) times daily.   . calcium citrate-vitamin D (CITRACAL+D) 315-200 MG-UNIT tablet Take 1 tablet by mouth 2 (two) times daily. (Patient not taking: Reported on 11/20/2017)   . Cholecalciferol (VITAMIN D3)  2000 units TABS Take 2,000 Units at bedtime by mouth.    . ciprofloxacin (CILOXAN) 0.3 % ophthalmic ointment  04/17/2019: No record of this in past 6 months per external pharmacy records  . cycloSPORINE (RESTASIS) 0.05 % ophthalmic emulsion Restasis MultiDose 0.05 % eye drops  INT 1 GTT IN [facility] EYE BID 04/17/2019: No record of this in past 6 months per external pharmacy records  . erythromycin ophthalmic ointment APPLY A SMALL AMOUNT INTO LEFT EYE TID 04/17/2019: No record of this in past 6 months per external pharmacy records  . glucose 4 GM chewable tablet Chew 4 tablets (16 g total) by mouth as needed for low blood sugar. 04/17/2019: No record of this in past 6 months per external pharmacy records  . insulin degludec (TRESIBA FLEXTOUCH) 100 UNIT/ML SOPN FlexTouch Pen Inject 0.16 mLs (16 Units total) into the skin at bedtime. (Patient not taking: Reported on 05/26/2019) 04/17/2019: No record of this in past 6 months per external pharmacy records  . insulin detemir (LEVEMIR) 100 UNIT/ML injection Inject 60 Units into the skin every morning.   . insulin lispro (HUMALOG KWIKPEN) 100 UNIT/ML KwikPen Inject 0.08-0.1 mLs (8-10 Units total) into the skin 3 (three) times daily. (Patient not taking: Reported on 05/24/2019) 04/17/2019: 30 mls filled 11/10/2018 [facility] per external pharmacy records  . Insulin Pen Needle (BD PEN NEEDLE NANO U/F) 32G X 4 MM MISC USE AS DIRECTED   . ketorolac (ACULAR) 0.5 % ophthalmic solution Place 1 drop into both eyes 3 (three)  times daily. 04/17/2019: No record of this in past 6 months per external pharmacy records  . levETIRAcetam (KEPPRA) 500 MG tablet Take 1 tablet (500 mg total) by mouth 2 (two) times daily for 3 days.   [name] levothyroxine (SYNTHROID) 100 MCG tablet Take 1 tablet (100 mcg total) by mouth daily before breakfast.   . losartan (COZAAR) 25 MG tablet Take 12.5 mg by mouth daily. 05/26/2019: Her son [name] says he thinks this medication was started while patient was  in [facility]- she has been out of it for a week so needs refill if she is to continue it  . Melatonin 1 MG TABS Take 1 tablet (1 mg total) by mouth daily. (Patient not taking: Reported on 05/26/2019) 04/17/2019: No record of this in past 6 months per external pharmacy records  . metFORMIN (GLUCOPHAGE) 1000 MG tablet Take 1 tablet (1,000 mg total) by mouth 2 (two) times daily with a meal.   . Nutritional Supplements (GLUCERNA SNACK SHAKE) LIQD Take 1 Dose by mouth 3 (three) times daily as needed. (Patient not taking: Reported on 05/26/2019) 05/26/2019: She is drinking vanilla ensure instead of Glucerna  . Olopatadine HCl (PATADAY) 0.2 % SOLN Place 1 drop into both eyes daily as needed (dry eyes).  04/17/2019: No record of this in past 6 months per external pharmacy records  . OneTouch Delica Lancets 58I MISC Use to check blood sugar before meals and at bedtime   . Polyethyl Glycol-Propyl Glycol (SYSTANE) 0.4-0.3 % GEL ophthalmic gel Place 1 application daily as needed into both eyes (dry eyes/irritation).    . prednisoLONE acetate (PRED FORTE) 1 % ophthalmic suspension  04/17/2019: No record of this in past 6 months per external pharmacy records  . rosuvastatin (CRESTOR) 20 MG tablet Take 1 tablet (20 mg total) by mouth daily.   . Semaglutide,0.25 or 0.5MG/DOS, (OZEMPIC, 0.25 OR 0.5 MG/DOSE,) 2 MG/1.5ML SOPN Inject 0.5 mg into the skin once a week.   . valACYclovir (VALTREX) 500 MG tablet TK 1 T PO QD 05/26/2019: She is out and needs refill if she is to continue this medication  . venlafaxine XR (EFFEXOR-XR) 150 MG 24 hr capsule TAKE 1 CAPSULE BY MOUTH DAILY WITH BREAKFAST    No facility-administered encounter medications on file as of 05/31/2019.     Objective:   Goals Addressed            This Visit's Progress     Patient Stated   . " I don't know why my blood sugar was so high when they took me to the hospital in March. I think I was taking my medications like I'm suppose to." (pt-stated)        CARE PLAN ENTRY (see longitudinal plan of care for additional care plan information)  Current Barriers:  . Chronic Disease Management support, education, and care coordination needs related to HTN, HLD, DMII, CKD Stage 3, and Depression- Clinical Goal(s) related to  HTN, HLD, DMII, CKD Stage 3, and Depression- patient reports her blood sugar was 123 today but she could not recall any other readings from the weekend, she asked if her strips were called in to the pharmacy in [place] .  Over the next 7-14 days, patient will:  . Work with the care management team to address educational, disease management, and care coordination needs  . Begin or continue self health monitoring activities as directed today Measure and record CBG (blood glucose) 2 times daily and take medications as prescribed . Call provider  office for new or worsened signs and symptoms HTN, HLD, DMII, CKD Stage 3, and Depression . Call care management team with questions or concerns . Verbalize basic understanding of patient centered plan of care established today  Interventions related to HTN, HLD, DMII, CKD Stage 3, and Depression . Assessed patient's DM self management strategies and reviewed blood glucose home readings-provided positive reinforcement for the blood sugar reading provided . Verified that DM test strips were ordered at [facility] in [place] . Collaborated with appropriate clinical care team members regarding patient needs- notified provider of results of medication reconciliation and medication refill needs and preferred pharmacy. . Advised patient samples of  Vanilla and [place] will be given to her at her clinic appointment tomorrow . Reviewed clinic appointment time and date of 06/01/19 at 10:15 am and ensured she has transportation.  . Reminded her to bring glucose readings with her to her appointment on 06/01/19  Patient Self Care Activities related to HTN, HLD, DMII, CKD Stage 3, and  Depression Patient is unable to independently self-manage chronic health conditions  Please see past updates related to this goal by clicking on the "Past Updates" button in the selected goal          Plan:   The care management team will reach out to the patient again over the next 30 days.    Kelli Churn RN, CCM, [facility] RN Care Manager [phone]

## 2019-06-01 ENCOUNTER — Encounter: Payer: Self-pay | Admitting: Internal Medicine

## 2019-06-01 ENCOUNTER — Ambulatory Visit: Payer: Medicare Other | Admitting: Dietician

## 2019-06-01 ENCOUNTER — Ambulatory Visit (INDEPENDENT_AMBULATORY_CARE_PROVIDER_SITE_OTHER): Payer: Medicare Other | Admitting: Internal Medicine

## 2019-06-01 ENCOUNTER — Encounter: Payer: Self-pay | Admitting: Dietician

## 2019-06-01 VITALS — BP 118/69 | HR 69 | Temp 98.1°F | Ht 62.0 in | Wt 122.3 lb

## 2019-06-01 DIAGNOSIS — E1142 Type 2 diabetes mellitus with diabetic polyneuropathy: Secondary | ICD-10-CM

## 2019-06-01 DIAGNOSIS — I972 Postmastectomy lymphedema syndrome: Secondary | ICD-10-CM | POA: Diagnosis not present

## 2019-06-01 DIAGNOSIS — F32A Depression, unspecified: Secondary | ICD-10-CM

## 2019-06-01 DIAGNOSIS — I10 Essential (primary) hypertension: Secondary | ICD-10-CM | POA: Diagnosis not present

## 2019-06-01 DIAGNOSIS — E039 Hypothyroidism, unspecified: Secondary | ICD-10-CM | POA: Diagnosis not present

## 2019-06-01 DIAGNOSIS — H409 Unspecified glaucoma: Secondary | ICD-10-CM | POA: Diagnosis not present

## 2019-06-01 DIAGNOSIS — Z79899 Other long term (current) drug therapy: Secondary | ICD-10-CM | POA: Diagnosis not present

## 2019-06-01 DIAGNOSIS — R1312 Dysphagia, oropharyngeal phase: Secondary | ICD-10-CM | POA: Diagnosis not present

## 2019-06-01 DIAGNOSIS — E1122 Type 2 diabetes mellitus with diabetic chronic kidney disease: Secondary | ICD-10-CM | POA: Diagnosis not present

## 2019-06-01 DIAGNOSIS — E1165 Type 2 diabetes mellitus with hyperglycemia: Secondary | ICD-10-CM | POA: Diagnosis not present

## 2019-06-01 DIAGNOSIS — M1990 Unspecified osteoarthritis, unspecified site: Secondary | ICD-10-CM | POA: Diagnosis not present

## 2019-06-01 DIAGNOSIS — F329 Major depressive disorder, single episode, unspecified: Secondary | ICD-10-CM

## 2019-06-01 DIAGNOSIS — N189 Chronic kidney disease, unspecified: Secondary | ICD-10-CM | POA: Diagnosis not present

## 2019-06-01 DIAGNOSIS — I129 Hypertensive chronic kidney disease with stage 1 through stage 4 chronic kidney disease, or unspecified chronic kidney disease: Secondary | ICD-10-CM | POA: Diagnosis not present

## 2019-06-01 DIAGNOSIS — H3530 Unspecified macular degeneration: Secondary | ICD-10-CM | POA: Diagnosis not present

## 2019-06-01 DIAGNOSIS — Z794 Long term (current) use of insulin: Secondary | ICD-10-CM

## 2019-06-01 DIAGNOSIS — N1330 Unspecified hydronephrosis: Secondary | ICD-10-CM

## 2019-06-01 DIAGNOSIS — G5600 Carpal tunnel syndrome, unspecified upper limb: Secondary | ICD-10-CM | POA: Insufficient documentation

## 2019-06-01 DIAGNOSIS — G5601 Carpal tunnel syndrome, right upper limb: Secondary | ICD-10-CM

## 2019-06-01 DIAGNOSIS — G629 Polyneuropathy, unspecified: Secondary | ICD-10-CM | POA: Diagnosis not present

## 2019-06-01 DIAGNOSIS — N183 Chronic kidney disease, stage 3 unspecified: Secondary | ICD-10-CM

## 2019-06-01 DIAGNOSIS — Z9011 Acquired absence of right breast and nipple: Secondary | ICD-10-CM | POA: Diagnosis not present

## 2019-06-01 DIAGNOSIS — G9340 Encephalopathy, unspecified: Secondary | ICD-10-CM | POA: Diagnosis not present

## 2019-06-01 MED ORDER — LEVOTHYROXINE SODIUM 88 MCG PO TABS: 88.0000 ug | ORAL_TABLET | Freq: Every day | ORAL | 1 refills | Status: DC

## 2019-06-01 MED ORDER — ACCU-CHEK GUIDE VI STRP: ORAL_STRIP | 11 refills | Status: DC

## 2019-06-01 MED ORDER — ACCU-CHEK SOFTCLIX LANCETS MISC: 10 refills | Status: DC

## 2019-06-01 MED ORDER — ACCU-CHEK GUIDE ME W/DEVICE KIT: PACK | 1 refills | Status: DC

## 2019-06-01 NOTE — Assessment & Plan Note (Addendum)
>>  ASSESSMENT AND PLAN FOR CARPAL TUNNEL SYNDROME OF RIGHT WRIST WRITTEN ON 06/01/2019  2:40 PM BY Candence Sease, MD  -This problem is chronic and stable -Patient still complains of tingling numbness in her right fingers and hand as well as tingling numbness and her bilateral lower extremities -Patient still has persistent right hand weakness.  Patient states that this is chronic and she is working with physical therapy on this (please read my note under carpal tunnel syndrome) -Patient is currently not on any medication for peripheral neuropathy.  Will consider checking her B12 level at her follow-up visit.  If her symptoms worsen would consider starting her on Lyrica or gabapentin. -No further work-up at this time  >>ASSESSMENT AND PLAN FOR CARPAL TUNNEL SYNDROME WRITTEN ON 06/01/2019  2:42 PM BY Rahmir Beever, MD  -Patient has a history of right middle finger trigger finger and has undergone surgery for this.  Patient states that she also had carpal tunnel surgery at this time but I am uncertain if this was the case -She does have a positive Tinel's and Phalen's sign today -Still has persistent weakness in her right hand (patient states is chronic but I think it has worsened over the last couple of months) -I will refer her to neurology for an EMG/nerve conduction study and follow-up results. -Given area of weakness this may be secondary to brachial plexus issue versus carpal tunnel syndrome versus mononeuritis from uncontrolled diabetes -No further work-up at this time

## 2019-06-01 NOTE — Assessment & Plan Note (Signed)
Lab Results  Component Value Date   HGBA1C 14.4 (H) 04/18/2019   HGBA1C 9.0 (A) 12/08/2018   HGBA1C 7.7 (A) 08/25/2018     Assessment: Diabetes control:  Uncontrolled Progress toward A1C goal:   Deteriorated Comments: Patient's last A1c was done in March and she does not require another one at this time.  However, patient's son states that patient's blood sugars have remained in the 100s at home.  Patient has not been taking her insulin as she has been having hypoglycemic episodes since she has not been eating much at home.  She is currently on Ozempic 0.5 mg weekly as well as Metformin 1000 mg twice daily.  Plan: Medications:  continue current medications Home glucose monitoring: Frequency:   Timing:   Instruction/counseling given: reminded to bring blood glucose meter & log to each visit and reminded to bring medications to each visit Educational resources provided:   Self management tools provided:   Other plans: If patient's appetite continues to be poor would consider decreasing her Ozempic to 0.25 mg at her next visit and monitoring her.

## 2019-06-01 NOTE — Patient Instructions (Addendum)
-  It was a pleasure seeing you today -I will decrease the dose of your Synthroid to 88 mcg from 100 mcg -Continue to work with physical therapy to improve the strength in your right hand -If your appetite remains low on your follow-up visit we will consider stopping or lowering the dose of your Ozempic -Please bring your glucose meter at your next visit -Please follow-up with urology Dr. Jeffie Pollock -Please make an appointment with Miquel Dunn to follow-up for your depression -Please call me for any questions or concerns

## 2019-06-01 NOTE — Assessment & Plan Note (Signed)
-  This problem is chronic and stable -Patient has been taking her thyroid medication appropriately.  Instructions again were given today on how to take her thyroid medication (early in the morning about a half an hour to an hour before she eats anything or takes other pills).  Patient and son expressed understanding of these instructions -Patient's TSH last week was noted to be low.  Her free T4 was within normal limits -We will decrease patient's Synthroid to 88 mcg daily. -We will recheck her TSH in 4 to 6 weeks -No further work-up at this time

## 2019-06-01 NOTE — Progress Notes (Signed)
Dcoumention: met breifly with patient and her son today. Her son has been using his meter and strips to check [place]'s blood sugar because she could not find her meter. A new sample Accu chek [place] meter was supplied to her today with 10 strips and 10 softclixs lancets. Her son said he knows how to use it. They were informed that they can upload readings to [employer] and allow [place] to view them in between visits. Contact information provided for any follow up questions or concerns. Will request testing supplies in her behalf.  Debera Lat, [place] 06/01/2019 12:36 PM.

## 2019-06-01 NOTE — Assessment & Plan Note (Signed)
BP Readings from Last 3 Encounters:  06/01/19 118/69  05/26/19 135/71  04/21/19 (!) 160/80    Lab Results  Component Value Date   NA 139 05/26/2019   K 4.1 05/26/2019   CREATININE 1.20 (H) 05/26/2019    Assessment: Blood pressure control:  Well-controlled Progress toward BP goal:   At goal Comments: Patient is currently not on any antihypertensive medications secondary to episodes of hypotension.  Patient blood pressures well controlled today.  Plan: Medications: Patient remains well controlled off medications Educational resources provided:   Self management tools provided:   Other plans: We will continue to monitor

## 2019-06-01 NOTE — Assessment & Plan Note (Signed)
-  This problem is chronic and stable -Patient was supposed to follow-up with urology (Dr. Jeffie Pollock) last year but has not followed up with him yet -She did have a Lasix renogram done which showed low-grade partial right-sided obstruction possibly below the distal ureter -I encouraged patient to call urology office and follow-up with them.  Patient and son expressed understanding and will call the office to make an appointment. -No further work-up at this time

## 2019-06-01 NOTE — Assessment & Plan Note (Signed)
-  Patient has a history of right middle finger trigger finger and has undergone surgery for this.  Patient states that she also had carpal tunnel surgery at this time but I am uncertain if this was the case -She does have a positive Tinel's and Phalen's sign today -Still has persistent weakness in her right hand (patient states is chronic but I think it has worsened over the last couple of months) -I will refer her to neurology for an EMG/nerve conduction study and follow-up results. -Given area of weakness this may be secondary to brachial plexus issue versus carpal tunnel syndrome versus mononeuritis from uncontrolled diabetes -No further work-up at this time

## 2019-06-01 NOTE — Assessment & Plan Note (Signed)
-  This problem is chronic and stable -Patient is compliant with Effexor and buspirone -She states that her depression is better controlled now and her PHQ-9 score is 4 today -Patient to call Miquel Dunn and make a follow-up appointment -No further work-up at this time

## 2019-06-01 NOTE — Assessment & Plan Note (Signed)
-  This problem is chronic and stable -Patient's BMP last week showed a creatinine of 1.2 -Patient's creatinine over the last 6 months has varied between 0.9-1.25 -We will continue to monitor closely.  Would consider repeat BMP next visit

## 2019-06-01 NOTE — Progress Notes (Signed)
   Subjective:    Patient ID: [name], female    DOB: [birth year], 69 y.o.   MRN: [id number]  HPI  I have seen and examined this patient.  Patient is here for routine follow-up of her hypertension and diabetes.  Patient was seen in the clinic last week and had blood work done which showed a decreased TSH.  Patient denies any symptoms of hyperthyroidism at this time.  Patient states that she feels well except for chronic right hand weakness and has no other complaints at this time.  Patient also states that he is compliant with her medications.   Review of Systems  Constitutional: Negative.   HENT: Negative.   Respiratory: Negative.   Cardiovascular: Negative.   Gastrointestinal: Negative.   Musculoskeletal: Negative.   Neurological: Positive for numbness.       Patient complains of numbness in the right hand as well as numbness in her bilateral lower extremities.  Patient also noted to have right hand weakness which she states is chronic.  Psychiatric/Behavioral: Negative.        Objective:   Physical Exam Constitutional:      Appearance: Normal appearance.  HENT:     Head: Normocephalic and atraumatic.  Cardiovascular:     Rate and Rhythm: Normal rate and regular rhythm.     Heart sounds: Normal heart sounds.  Pulmonary:     Breath sounds: Normal breath sounds. No wheezing.  Abdominal:     General: Bowel sounds are normal. There is no distension.     Palpations: Abdomen is soft.     Tenderness: There is no abdominal tenderness.  Musculoskeletal:        General: No swelling or tenderness.  Neurological:     Mental Status: She is alert and oriented to person, place, and time.     Comments: Patient noted to have right hand weakness (difficulty making a fist).  She also has a positive Phalen and Tinel sign in her right hand  Psychiatric:        Mood and Affect: Mood normal.        Behavior: Behavior normal.           Assessment & Plan:  Please see problem based  charting for assessment and plan:

## 2019-06-02 ENCOUNTER — Telehealth: Payer: Self-pay | Admitting: Internal Medicine

## 2019-06-02 LAB — MICROALBUMIN / CREATININE URINE RATIO
Creatinine, Urine: 153.6 mg/dL
Microalb/Creat Ratio: 100 mg/g creat — ABNORMAL HIGH (ref 0–29)
Microalbumin, Urine: 153.8 ug/mL

## 2019-06-02 NOTE — Telephone Encounter (Signed)
I called the patient discussed results of her urine test with her.  Patient did have a mildly elevated microalbumin/creatinine ratio of 100.  Explained to the patient that since her blood sugars are now much better controlled I would prefer to repeat this test in 1 month before starting her on an ARB/ACE inhibitor.  Patient had orthostasis and hypotension while on previous antihypertensives and would like to avoid this if possible.  If patient has persistently elevated urine microalbumin creatinine ratio would consider starting the patient on low-dose ACE inhibitor/ARB.  Patient expressed understanding and is in agreement with plan.

## 2019-06-03 DIAGNOSIS — M1990 Unspecified osteoarthritis, unspecified site: Secondary | ICD-10-CM | POA: Diagnosis not present

## 2019-06-03 DIAGNOSIS — Z79899 Other long term (current) drug therapy: Secondary | ICD-10-CM | POA: Diagnosis not present

## 2019-06-03 DIAGNOSIS — E1122 Type 2 diabetes mellitus with diabetic chronic kidney disease: Secondary | ICD-10-CM | POA: Diagnosis not present

## 2019-06-03 DIAGNOSIS — Z9011 Acquired absence of right breast and nipple: Secondary | ICD-10-CM | POA: Diagnosis not present

## 2019-06-03 DIAGNOSIS — N189 Chronic kidney disease, unspecified: Secondary | ICD-10-CM | POA: Diagnosis not present

## 2019-06-03 DIAGNOSIS — E1142 Type 2 diabetes mellitus with diabetic polyneuropathy: Secondary | ICD-10-CM | POA: Diagnosis not present

## 2019-06-03 DIAGNOSIS — I972 Postmastectomy lymphedema syndrome: Secondary | ICD-10-CM | POA: Diagnosis not present

## 2019-06-03 DIAGNOSIS — H3530 Unspecified macular degeneration: Secondary | ICD-10-CM | POA: Diagnosis not present

## 2019-06-03 DIAGNOSIS — I129 Hypertensive chronic kidney disease with stage 1 through stage 4 chronic kidney disease, or unspecified chronic kidney disease: Secondary | ICD-10-CM | POA: Diagnosis not present

## 2019-06-03 DIAGNOSIS — R1312 Dysphagia, oropharyngeal phase: Secondary | ICD-10-CM | POA: Diagnosis not present

## 2019-06-03 DIAGNOSIS — E039 Hypothyroidism, unspecified: Secondary | ICD-10-CM | POA: Diagnosis not present

## 2019-06-03 DIAGNOSIS — E1165 Type 2 diabetes mellitus with hyperglycemia: Secondary | ICD-10-CM | POA: Diagnosis not present

## 2019-06-03 DIAGNOSIS — G9340 Encephalopathy, unspecified: Secondary | ICD-10-CM | POA: Diagnosis not present

## 2019-06-03 DIAGNOSIS — H409 Unspecified glaucoma: Secondary | ICD-10-CM | POA: Diagnosis not present

## 2019-06-03 DIAGNOSIS — Z794 Long term (current) use of insulin: Secondary | ICD-10-CM | POA: Diagnosis not present

## 2019-06-04 ENCOUNTER — Ambulatory Visit: Payer: Medicare Other | Admitting: *Deleted

## 2019-06-04 DIAGNOSIS — I10 Essential (primary) hypertension: Secondary | ICD-10-CM

## 2019-06-04 DIAGNOSIS — H3530 Unspecified macular degeneration: Secondary | ICD-10-CM | POA: Diagnosis not present

## 2019-06-04 DIAGNOSIS — H409 Unspecified glaucoma: Secondary | ICD-10-CM | POA: Diagnosis not present

## 2019-06-04 DIAGNOSIS — Z794 Long term (current) use of insulin: Secondary | ICD-10-CM

## 2019-06-04 DIAGNOSIS — N189 Chronic kidney disease, unspecified: Secondary | ICD-10-CM | POA: Diagnosis not present

## 2019-06-04 DIAGNOSIS — N183 Chronic kidney disease, stage 3 unspecified: Secondary | ICD-10-CM

## 2019-06-04 DIAGNOSIS — E1142 Type 2 diabetes mellitus with diabetic polyneuropathy: Secondary | ICD-10-CM | POA: Diagnosis not present

## 2019-06-04 DIAGNOSIS — Z79899 Other long term (current) drug therapy: Secondary | ICD-10-CM | POA: Diagnosis not present

## 2019-06-04 DIAGNOSIS — I129 Hypertensive chronic kidney disease with stage 1 through stage 4 chronic kidney disease, or unspecified chronic kidney disease: Secondary | ICD-10-CM | POA: Diagnosis not present

## 2019-06-04 DIAGNOSIS — R1312 Dysphagia, oropharyngeal phase: Secondary | ICD-10-CM | POA: Diagnosis not present

## 2019-06-04 DIAGNOSIS — E1165 Type 2 diabetes mellitus with hyperglycemia: Secondary | ICD-10-CM | POA: Diagnosis not present

## 2019-06-04 DIAGNOSIS — M1990 Unspecified osteoarthritis, unspecified site: Secondary | ICD-10-CM | POA: Diagnosis not present

## 2019-06-04 DIAGNOSIS — E039 Hypothyroidism, unspecified: Secondary | ICD-10-CM | POA: Diagnosis not present

## 2019-06-04 DIAGNOSIS — E1122 Type 2 diabetes mellitus with diabetic chronic kidney disease: Secondary | ICD-10-CM | POA: Diagnosis not present

## 2019-06-04 DIAGNOSIS — I972 Postmastectomy lymphedema syndrome: Secondary | ICD-10-CM | POA: Diagnosis not present

## 2019-06-04 DIAGNOSIS — G9340 Encephalopathy, unspecified: Secondary | ICD-10-CM | POA: Diagnosis not present

## 2019-06-04 DIAGNOSIS — Z9011 Acquired absence of right breast and nipple: Secondary | ICD-10-CM | POA: Diagnosis not present

## 2019-06-04 NOTE — Patient Instructions (Signed)
Visit Information It was nice speaking with you today. Please try to check your blood sugars at least twice daily preferable 4 times daily as directed by Dr. Dareen Piano.  Goals Addressed            This Visit's Progress     Patient Stated   . " I don't know why my blood sugar was so high when they took me to the hospital in March. I think I was taking my medications like I'm suppose to." (pt-stated)       CARE PLAN ENTRY (see longitudinal plan of care for additional care plan information)  Current Barriers:  . Chronic Disease Management support, education, and care coordination needs related to HTN, HLD, DMII, CKD Stage 3, and Depression- Clinical Goal(s) related to  HTN, HLD, DMII, CKD Stage 3, and Depression- spoke with patient , she says she is still receiving HH services of physical therapy and they are working with her on gait stability and right hand weakness, she reports her blood sugar was 123 last evening prior to supper when her son checked it; she could not recall any other readings. She denies hypoglycemia or symptoms of hypoglycemia. She says she is taking 88 mcg of Synthroid and her other medications as prescribed. She says she has not made follow up appointments with Dr Jeffie Pollock (urologist) or Dessie Coma (clinic behavioral health counselor) . Patient says her appetite is still not to baseline and that she likes the [place] that was provided to her by this CCM RN but not so much the strawberry flavor .  Over the next 30 days, patient will:  . Work with the care management team to address educational, disease management, and care coordination needs  . Begin or continue self health monitoring activities as directed today Measure and record CBG (blood glucose) 2 times daily and take medications as prescribed . Call provider office for new or worsened signs and symptoms HTN, HLD, DMII, CKD Stage 3, and Depression . Call care management team with questions or  concerns . Verbalize basic understanding of patient centered plan of care established today  Interventions related to HTN, HLD, DMII, CKD Stage 3, and Depression . Assessed patient's DM self management strategies and reviewed blood glucose home readings-provided positive reinforcement for the blood sugar reading provided and for reading meeting pre meal target . Reviewed Dr Wilber Bihari directions on after visit summary of 5/4 to check CBG before meals and at bedtime . Reviewed targets for pre and post meal CBG . Collaborated with appropriate clinical care team members regarding patient needs . Assessed appetite status and inquired about Glucerna samples proved to her on 06/01/19. Advised her this CCM RN will give her more if they have arrived when she is seen in the clinic in June . Provided her with the phone numbers for Dr. Ralene Muskrat office and to Dessie Coma' office and encouraged her to make follow up appointments as directed by Dr Dareen Piano . Reviewed clinic appointment time and date of 06/30/19 at 2:15 pm and ensured she has transportation . Reminded her to bring glucometer and glucose readings with her to her appointment on 06/30/19  Patient Self Care Activities related to HTN, HLD, DMII, CKD Stage 3, and Depression Patient is unable to independently self-manage chronic health conditions  Please see past updates related to this goal by clicking on the "Past Updates" button in the selected goal         The patient verbalized understanding of instructions provided today  and declined a print copy of patient instruction materials.   The care management team will reach out to the patient again over the next 30 days.   Kelli Churn RN, CCM, [facility] RN Care Manager [phone]

## 2019-06-04 NOTE — Chronic Care Management (AMB) (Signed)
Chronic Care Management   Follow Up Note   06/04/2019 Name: [name] MRN: [id number] DOB: 07-20-1950  Referred by: Aldine Contes, MD Reason for referral : Chronic Care Management (DM, CKD, HTN)   [name] is a 69 y.o. year old female who is a primary care patient of Aldine Contes, MD. The CCM team was consulted for assistance with chronic disease management and care coordination needs.    Review of patient status, including review of consultants reports, relevant laboratory and other test results, and collaboration with appropriate care team members and the patient's provider was performed as part of comprehensive patient evaluation and provision of chronic care management services.    SDOH (Social Determinants of Health) assessments performed: No See Care Plan activities for detailed interventions related to [facility])     Outpatient Encounter Medications as of 06/04/2019  Medication Sig Note  . Accu-Chek Softclix Lancets lancets Check blood sugar 5 times a day as instructed   . Acetaminophen 325 MG CAPS Take 325 mg by mouth every 6 (six) hours as needed (for pain).   . ALPHAGAN P 0.1 % SOLN Place 1 drop into both eyes 3 (three) times daily. 04/17/2019: No record of this in past 6 months per external pharmacy records  . Besifloxacin HCl (BESIVANCE) 0.6 % SUSP Besivance 0.6 % eye drops,suspension  INSTILL 1 DROP INTO THE LEFT EYE QID X 2 DAYS AFTER EACH MONTHLY EYE INJECTION 04/17/2019: No record of this in past 6 months per external pharmacy records  . Blood Glucose Monitoring Suppl (ACCU-CHEK GUIDE ME) w/Device KIT Check blood sugar 5 times a day   . busPIRone (BUSPAR) 5 MG tablet Take 1 tablet (5 mg total) by mouth 2 (two) times daily.   . calcium citrate-vitamin D (CITRACAL+D) 315-200 MG-UNIT tablet Take 1 tablet by mouth 2 (two) times daily. (Patient not taking: Reported on 11/20/2017)   . Cholecalciferol (VITAMIN D3) 2000 units TABS Take 2,000 Units at bedtime by mouth.      . ciprofloxacin (CILOXAN) 0.3 % ophthalmic ointment  04/17/2019: No record of this in past 6 months per external pharmacy records  . cycloSPORINE (RESTASIS) 0.05 % ophthalmic emulsion Restasis MultiDose 0.05 % eye drops  INT 1 GTT IN [facility] EYE BID 04/17/2019: No record of this in past 6 months per external pharmacy records  . erythromycin ophthalmic ointment APPLY A SMALL AMOUNT INTO LEFT EYE TID 04/17/2019: No record of this in past 6 months per external pharmacy records  . glucose 4 GM chewable tablet Chew 4 tablets (16 g total) by mouth as needed for low blood sugar. 04/17/2019: No record of this in past 6 months per external pharmacy records  . glucose blood (ACCU-CHEK GUIDE) test strip Check blood sugar 5 times per day   . Insulin Pen Needle (BD PEN NEEDLE NANO U/F) 32G X 4 MM MISC USE AS DIRECTED   . ketorolac (ACULAR) 0.5 % ophthalmic solution Place 1 drop into both eyes 3 (three) times daily. 04/17/2019: No record of this in past 6 months per external pharmacy records  . levothyroxine (SYNTHROID) 88 MCG tablet Take 1 tablet (88 mcg total) by mouth daily before breakfast.   . losartan (COZAAR) 25 MG tablet Take 12.5 mg by mouth daily. 05/26/2019: Her son [name] says he thinks this medication was started while patient was in [facility]- she has been out of it for a week so needs refill if she is to continue it  . metFORMIN (GLUCOPHAGE) 1000 MG tablet Take 1  tablet (1,000 mg total) by mouth 2 (two) times daily with a meal.   . Nutritional Supplements (GLUCERNA SNACK SHAKE) LIQD Take 1 Dose by mouth 3 (three) times daily as needed. (Patient not taking: Reported on 05/26/2019) 05/26/2019: She is drinking vanilla ensure instead of Glucerna  . Olopatadine HCl (PATADAY) 0.2 % SOLN Place 1 drop into both eyes daily as needed (dry eyes).  04/17/2019: No record of this in past 6 months per external pharmacy records  . Polyethyl Glycol-Propyl Glycol (SYSTANE) 0.4-0.3 % GEL ophthalmic gel Place 1 application daily  as needed into both eyes (dry eyes/irritation).    . prednisoLONE acetate (PRED FORTE) 1 % ophthalmic suspension  04/17/2019: No record of this in past 6 months per external pharmacy records  . rosuvastatin (CRESTOR) 20 MG tablet Take 1 tablet (20 mg total) by mouth daily.   . Semaglutide,0.25 or 0.5MG/DOS, (OZEMPIC, 0.25 OR 0.5 MG/DOSE,) 2 MG/1.5ML SOPN Inject 0.5 mg into the skin once a week.   . valACYclovir (VALTREX) 500 MG tablet TK 1 T PO QD 05/26/2019: She is out and needs refill if she is to continue this medication  . venlafaxine XR (EFFEXOR-XR) 150 MG 24 hr capsule TAKE 1 CAPSULE BY MOUTH DAILY WITH BREAKFAST    No facility-administered encounter medications on file as of 06/04/2019.     Objective:  Lab Results  Component Value Date   HGBA1C 14.4 (H) 04/18/2019   HGBA1C 9.0 (A) 12/08/2018   HGBA1C 7.7 (A) 08/25/2018   Lab Results  Component Value Date   MICROALBUR 1.0 12/09/2013   LDLCALC 154 (H) 10/13/2018   CREATININE 1.20 (H) 05/26/2019   Lab Results  Component Value Date   LABMICR 153.8 06/01/2019   LABMICR See below: 01/12/2019   MICROALBUR 1.0 12/09/2013   MICROALBUR 1.87 09/06/2013    Wt Readings from Last 3 Encounters:  06/01/19 122 lb 4.8 oz (55.5 kg)  05/26/19 124 lb 11.2 oz (56.6 kg)  04/18/19 121 lb 11.1 oz (55.2 kg)    BP Readings from Last 3 Encounters:  06/01/19 118/69  05/26/19 135/71  04/21/19 (!) 160/80     Goals Addressed            This Visit's Progress     Patient Stated   . " I don't know why my blood sugar was so high when they took me to the hospital in March. I think I was taking my medications like I'm suppose to." (pt-stated)       CARE PLAN ENTRY (see longitudinal plan of care for additional care plan information)  Current Barriers:  . Chronic Disease Management support, education, and care coordination needs related to HTN, HLD, DMII, CKD Stage 3, and Depression- Clinical Goal(s) related to  HTN, HLD, DMII, CKD Stage 3, and  Depression- spoke with patient , she says she is still receiving HH services of physical therapy and they are working with her on gait stability and right hand weakness, she reports her blood sugar was 123 last evening prior to supper when her son checked it; she could not recall any other readings.She denies hypoglycemia or symptoms of hypoglycemia.  She says she is taking 88 mcg of Synthroid and her other medications as prescribed. She says she has not made follow up appointments with Dr Jeffie Pollock (urologist) or Dessie Coma (clinic behavioral health counselor) . Patient says her appetite is still not to baseline and that she likes the [place] that was provided to her by this CCM RN  but not so much the strawberry flavor .  Over the next 30 days, patient will:  . Work with the care management team to address educational, disease management, and care coordination needs  . Begin or continue self health monitoring activities as directed today Measure and record CBG (blood glucose) 2 times daily and take medications as prescribed . Call provider office for new or worsened signs and symptoms HTN, HLD, DMII, CKD Stage 3, and Depression . Call care management team with questions or concerns . Verbalize basic understanding of patient centered plan of care established today  Interventions related to HTN, HLD, DMII, CKD Stage 3, and Depression . Assessed patient's DM self management strategies and reviewed blood glucose home readings-provided positive reinforcement for the blood sugar reading provided and for reading meeting pre meal target . Reviewed Dr Wilber Bihari directions on after visit summary of 5/4 to check CBG before meals and at bedtime . Reviewed targets for pre and post meal CBG . Collaborated with appropriate clinical care team members regarding patient needs . Assessed appetite status and inquired about Glucerna samples proved to her on 06/01/19. Advised her this CCM RN will give her more if  they have arrived when she is seen in the clinic in June . Provided her with the phone numbers for Dr. Ralene Muskrat office and to Dessie Coma' office and encouraged her to make follow up appointments as directed by Dr Dareen Piano . Reviewed clinic appointment time and date of 06/30/19 at 2:15 pm and ensured she has transportation . Reminded her to bring glucometer and glucose readings with her to her appointment on 06/30/19  Patient Self Care Activities related to HTN, HLD, DMII, CKD Stage 3, and Depression Patient is unable to independently self-manage chronic health conditions  Please see past updates related to this goal by clicking on the "Past Updates" button in the selected goal          Plan:   The care management team will reach out to the patient again over the next 30 days.    Kelli Churn RN, CCM, [facility] RN Care Manager [phone]

## 2019-06-05 NOTE — Assessment & Plan Note (Signed)
Pt currently prescribed 157mcg levothyroxine daily. Last TSH 1 month ago was elevated to 11.781. Pt has a history of elevated TSH for the last year (TSH never below 12 going back to 03/03/2018). Pt does not know how or if she is taking this medication. She is now staying with her son who is managing all her medications. Currently she is awaiting most of her medications to be delivered, as she gets monthly supplies in the mail. Is unsure if they are going to her address or her son's address. She does not know if she has any levothyroxine at home. Denies constipation, fatigue, cold intolerance, confusion, or abdominal pain.   - recheck TSH and T4 today - PCP follow-up next week - CCM to call son tonight to follow up medication reconciliation - also needs education regarding how to take medication (early in morning, on empty stomach, and 30 minutes before eating)

## 2019-06-05 NOTE — Assessment & Plan Note (Signed)
Pt presents today for diabetes follow-up. Was recently admitted in March for non-ketotic hyperglycemia and hyperglycemia induced focal myotonic seizures. Discharged to SNF on 16u Tresiba daily, 60u Levemir daily, humalog 8-10u TID with meals, ozempic 0.5mg  weekly, and metformin 1000mg  BID. Since leaving [facility], pt has been staying with her son. He manages all of her medications and checks her blood sugars. CCM follows with the patient and the son told them over the phone yesterday that the pt was no longer taking any insulin because she was not eating and having low blood sugars. Reported pre and post meal CBGs of 90-130.   Pt accompanied at her appointment today with her granddaughter. Unfortunately, neither party is able to verify what the patient is taking at home for her diabetes. CBG in the office is 267. Last A1c in March was 14.4. Pt states she took her last metformin pill this morning. Is awaiting her month supply of medications to be mailed to her, but doesn't know if it will make it to her son's house. Pt endorses eating multiple meals a day, though describes eating less due to getting prepared meals delivered to her house that don't taste good. Denies episodes of diaphoresis, lightheadedness, tremor, palpitations, or confusion that would be consistent with hypoglycemia. Both pt and granddaughter have questions regarding if the pt should still take insulin when she is not eating. Education provided regarding indication for long-acting insulin for this pt even if she is eating less. All questions and concerns addressed.   - will defer any medication changes today until it can be verified what the patient is actually taking - BMP and glc today - PCP follow-up in 6 days - instructed pt to bring her meter and medications to that visit, and recommended pt's son be there as well if he can get off work (offered work excuse for caregiver) - CCM to follow-up with pt's son regarding home  medications  ADDENDUM CCM reconciled medications with son over the telephone as outlined on their note on 4/28 at 2200. Appropriated refills sent to [facility] on [address] in [place].

## 2019-06-07 ENCOUNTER — Ambulatory Visit: Payer: Medicare Other

## 2019-06-07 DIAGNOSIS — I10 Essential (primary) hypertension: Secondary | ICD-10-CM

## 2019-06-07 DIAGNOSIS — I129 Hypertensive chronic kidney disease with stage 1 through stage 4 chronic kidney disease, or unspecified chronic kidney disease: Secondary | ICD-10-CM | POA: Diagnosis not present

## 2019-06-07 DIAGNOSIS — Z794 Long term (current) use of insulin: Secondary | ICD-10-CM

## 2019-06-07 DIAGNOSIS — H3530 Unspecified macular degeneration: Secondary | ICD-10-CM | POA: Diagnosis not present

## 2019-06-07 DIAGNOSIS — M1990 Unspecified osteoarthritis, unspecified site: Secondary | ICD-10-CM | POA: Diagnosis not present

## 2019-06-07 DIAGNOSIS — N183 Chronic kidney disease, stage 3 unspecified: Secondary | ICD-10-CM

## 2019-06-07 DIAGNOSIS — G9340 Encephalopathy, unspecified: Secondary | ICD-10-CM | POA: Diagnosis not present

## 2019-06-07 DIAGNOSIS — N189 Chronic kidney disease, unspecified: Secondary | ICD-10-CM | POA: Diagnosis not present

## 2019-06-07 DIAGNOSIS — E1142 Type 2 diabetes mellitus with diabetic polyneuropathy: Secondary | ICD-10-CM | POA: Diagnosis not present

## 2019-06-07 DIAGNOSIS — Z9011 Acquired absence of right breast and nipple: Secondary | ICD-10-CM | POA: Diagnosis not present

## 2019-06-07 DIAGNOSIS — E1122 Type 2 diabetes mellitus with diabetic chronic kidney disease: Secondary | ICD-10-CM | POA: Diagnosis not present

## 2019-06-07 DIAGNOSIS — E1165 Type 2 diabetes mellitus with hyperglycemia: Secondary | ICD-10-CM | POA: Diagnosis not present

## 2019-06-07 DIAGNOSIS — E039 Hypothyroidism, unspecified: Secondary | ICD-10-CM | POA: Diagnosis not present

## 2019-06-07 DIAGNOSIS — H409 Unspecified glaucoma: Secondary | ICD-10-CM | POA: Diagnosis not present

## 2019-06-07 DIAGNOSIS — R1312 Dysphagia, oropharyngeal phase: Secondary | ICD-10-CM | POA: Diagnosis not present

## 2019-06-07 DIAGNOSIS — Z79899 Other long term (current) drug therapy: Secondary | ICD-10-CM | POA: Diagnosis not present

## 2019-06-07 DIAGNOSIS — I972 Postmastectomy lymphedema syndrome: Secondary | ICD-10-CM | POA: Diagnosis not present

## 2019-06-07 NOTE — Progress Notes (Signed)
Internal Medicine Clinic Attending  CCM services provided by the care management provider and their documentation were discussed with Dr. Maricela Bo. We reviewed the pertinent findings, urgent action items addressed by the resident and non-urgent items to be addressed by the PCP.  I agree with the assessment, diagnosis, and plan of care documented in the CCM and resident's note.  Aldine Contes, MD 06/07/2019

## 2019-06-07 NOTE — Progress Notes (Signed)
Internal Medicine Clinic Attending  Case discussed with Dr. Jones at the time of the visit.  We reviewed the resident's history and exam and pertinent patient test results.  I agree with the assessment, diagnosis, and plan of care documented in the resident's note.  

## 2019-06-07 NOTE — Chronic Care Management (AMB) (Signed)
Care Management   Follow Up Note   06/07/2019 Name: [name] MRN: [id number] DOB: 04-Jan-1951  Referred by: Aldine Contes, MD Reason for referral : Care Coordination (PCS)   [name] is a 69 y.o. year old female who is a primary care patient of Aldine Contes, MD. The care management team was consulted for assistance with care management and care coordination needs.    Review of patient status, including review of consultants reports, relevant laboratory and other test results, and collaboration with appropriate care team members and the patient's provider was performed as part of comprehensive patient evaluation and provision of chronic care management services.    SDOH (Social Determinants of Health) assessments performed: No See Care Plan activities for detailed interventions related to [facility])     Advanced Directives: See Care Plan and Vynca application for related entries.   Goals Addressed            This Visit's Progress   . "I could use some help around the house when I get back home" (pt-stated)       Current Barriers:  [name] Knowledge Barriers related to resources available for ADL IADL limitations  Case Manager Clinical Goal(s):  [name] Over the next 60 days, patient will work with BSW to address needs related to ADL IADL limitations; obtaining [place]  . Over the next 60 days, BSW will collaborate with RN Care Manager to address care management and care coordination needs  Interventions:  . Contacted patient regarding status of transition from son's home to her home.  Patient remains at son's home at this time and is unsure of when she will return to her home.   . [place] to determine if PCS can be initiated in [facility] then transitioned to [place] when/if needed.  Per representative, services can be initiated while she is at her son's home.  Services can be transitioned to [place] but may require change in particular  aide or service agency to accommodate . Contacted son, [name], to discuss plans for patient's transition home.  Son reports that plan is for patient to live with him until necessary renovations are completed at her home.  Patient's other son and daughter currently live in the home but daughter will not remain there once patient transitions back.  Other son will remain in the home.  . Talked with son about specifics of PCS. Son is concerned about his brothers ability to assist patient with managing her medications and stated that he feels this is the main reason she recently ended up in the hospital.  . Informed son that PCS aides do not assist with medication management and will not be able to assist with checking blood sugar.  Encouraged son to consider this when long term planning for patient.   Nash Dimmer with RN Care Manager regarding patient status and long term plan . Faxed request for PCS to [facility]  Patient Self Care Activities:  . Patient verbalizes understanding of plan to work with BSW to obtain [employer]  . Self administers medications as prescribed . Attends all scheduled provider appointments  Please see past updates related to this goal by clicking on the "Past Updates" button in the selected goal          The patient has been provided with contact information for the care management team and has been advised to call with any health related questions or concerns.    Geographical information systems officer, [place]  Coordination Social Worker [place] [phone]

## 2019-06-07 NOTE — Progress Notes (Signed)
Internal Medicine Clinic Resident  I have personally reviewed this encounter including the documentation in this note and/or discussed this patient with the care management provider. I will address any urgent items identified by the care management provider and will communicate my actions to the patient's PCP. I have reviewed the patient's CCM visit with my supervising attending, Dr Dareen Piano.  Lars Mage, MD 06/07/2019

## 2019-06-07 NOTE — Patient Instructions (Signed)
Visit Information  Goals Addressed            This Visit's Progress   . "I could use some help around the house when I get back home" (pt-stated)       Current Barriers:  [name] Knowledge Barriers related to resources available for ADL IADL limitations  Case Manager Clinical Goal(s):  [name] Over the next 60 days, patient will work with BSW to address needs related to ADL IADL limitations; obtaining [place]  . Over the next 60 days, BSW will collaborate with RN Care Manager to address care management and care coordination needs  Interventions:  . Contacted patient regarding status of transition from son's home to her home.  Patient remains at son's home at this time and is unsure of when she will return to her home.   . [place] to determine if PCS can be initiated in [facility] then transitioned to [place] when/if needed.  Per representative, services can be initiated while she is at her son's home.  Services can be transitioned to [place] but may require change in particular aide or service agency to accommodate . Contacted son, [name], to discuss plans for patient's transition home.  Son reports that plan is for patient to live with him until necessary renovations are completed at her home.  Patient's other son and daughter currently live in the home but daughter will not remain there once patient transitions back.  Other son will remain in the home.  . Talked with son about specifics of PCS. Son is concerned about his brothers ability to assist patient with managing her medications and stated that he feels this is the main reason she recently ended up in the hospital.  . Informed son that PCS aides do not assist with medication management and will not be able to assist with checking blood sugar.  Encouraged son to consider this when long term planning for patient.   Nash Dimmer with RN Care Manager regarding patient status and long term plan . Faxed request  for PCS to [place]  Patient Self Care Activities:  . Patient verbalizes understanding of plan to work with BSW to obtain [employer]  . Self administers medications as prescribed . Attends all scheduled provider appointments  Please see past updates related to this goal by clicking on the "Past Updates" button in the selected goal         Patient verbalizes understanding of instructions provided today.   The patient has been provided with contact information for the care management team and has been advised to call with any health related questions or concerns.      Ronn Melena, [place] Coordination Social Worker [place] [phone]

## 2019-06-08 ENCOUNTER — Other Ambulatory Visit: Payer: Self-pay | Admitting: *Deleted

## 2019-06-08 DIAGNOSIS — G9340 Encephalopathy, unspecified: Secondary | ICD-10-CM | POA: Diagnosis not present

## 2019-06-08 DIAGNOSIS — Z9011 Acquired absence of right breast and nipple: Secondary | ICD-10-CM | POA: Diagnosis not present

## 2019-06-08 DIAGNOSIS — I129 Hypertensive chronic kidney disease with stage 1 through stage 4 chronic kidney disease, or unspecified chronic kidney disease: Secondary | ICD-10-CM | POA: Diagnosis not present

## 2019-06-08 DIAGNOSIS — N189 Chronic kidney disease, unspecified: Secondary | ICD-10-CM | POA: Diagnosis not present

## 2019-06-08 DIAGNOSIS — M1990 Unspecified osteoarthritis, unspecified site: Secondary | ICD-10-CM | POA: Diagnosis not present

## 2019-06-08 DIAGNOSIS — E1122 Type 2 diabetes mellitus with diabetic chronic kidney disease: Secondary | ICD-10-CM | POA: Diagnosis not present

## 2019-06-08 DIAGNOSIS — E039 Hypothyroidism, unspecified: Secondary | ICD-10-CM | POA: Diagnosis not present

## 2019-06-08 DIAGNOSIS — E1165 Type 2 diabetes mellitus with hyperglycemia: Secondary | ICD-10-CM | POA: Diagnosis not present

## 2019-06-08 DIAGNOSIS — Z79899 Other long term (current) drug therapy: Secondary | ICD-10-CM | POA: Diagnosis not present

## 2019-06-08 DIAGNOSIS — H409 Unspecified glaucoma: Secondary | ICD-10-CM | POA: Diagnosis not present

## 2019-06-08 DIAGNOSIS — H3530 Unspecified macular degeneration: Secondary | ICD-10-CM | POA: Diagnosis not present

## 2019-06-08 DIAGNOSIS — I972 Postmastectomy lymphedema syndrome: Secondary | ICD-10-CM | POA: Diagnosis not present

## 2019-06-08 DIAGNOSIS — R1312 Dysphagia, oropharyngeal phase: Secondary | ICD-10-CM | POA: Diagnosis not present

## 2019-06-08 DIAGNOSIS — Z794 Long term (current) use of insulin: Secondary | ICD-10-CM | POA: Diagnosis not present

## 2019-06-08 DIAGNOSIS — E1142 Type 2 diabetes mellitus with diabetic polyneuropathy: Secondary | ICD-10-CM | POA: Diagnosis not present

## 2019-06-09 ENCOUNTER — Ambulatory Visit: Payer: Self-pay

## 2019-06-09 DIAGNOSIS — E1142 Type 2 diabetes mellitus with diabetic polyneuropathy: Secondary | ICD-10-CM

## 2019-06-09 DIAGNOSIS — I10 Essential (primary) hypertension: Secondary | ICD-10-CM

## 2019-06-09 DIAGNOSIS — Z794 Long term (current) use of insulin: Secondary | ICD-10-CM

## 2019-06-09 DIAGNOSIS — N183 Chronic kidney disease, stage 3 unspecified: Secondary | ICD-10-CM

## 2019-06-09 NOTE — Patient Instructions (Signed)
Visit Information  Goals Addressed            This Visit's Progress   . "I could use some help around the house when I get back home" (pt-stated)       Current Barriers:  [name] Knowledge Barriers related to resources available for ADL IADL limitations  Case Manager Clinical Goal(s):  [name] Over the next 60 days, patient will work with BSW to address needs related to ADL IADL limitations; obtaining [place]  . Over the next 60 days, BSW will collaborate with RN Care Manager to address care management and care coordination needs  Interventions:  . [place] regarding status of request for PCS.  Unable to process due to date of last provider visit being illegible.  [name] Collaborated with RNCM, Kelli Churn, to assist with provider correcting form.  . Faxed corrected form to [place] . Called son and provided update.   . Informed son I will contact [facility] on 06/11/19 to ensure that form is being processed. .  . Patient Self Care Activities:  . Patient verbalizes understanding of plan to work with BSW to obtain [employer]  . Self administers medications as prescribed . Attends all scheduled provider appointments  Please see past updates related to this goal by clicking on the "Past Updates" button in the selected goal           Telephone follow up appointment with care management team member scheduled for:06/11/19     Ronn Melena, [place] Worker [place] [phone]

## 2019-06-09 NOTE — Chronic Care Management (AMB) (Signed)
  Care Management   Follow Up Note   06/09/2019 Name: [name] MRN: [id number] DOB: [birth year]  Referred by: Aldine Contes, MD Reason for referral : Care Coordination (PCS)   [name] is a 69 y.o. year old female who is a primary care patient of Aldine Contes, MD. The care management team was consulted for assistance with care management and care coordination needs.    Review of patient status, including review of consultants reports, relevant laboratory and other test results, and collaboration with appropriate care team members and the patient's provider was performed as part of comprehensive patient evaluation and provision of chronic care management services.    SDOH (Social Determinants of Health) assessments performed: No See Care Plan activities for detailed interventions related to [facility])     Advanced Directives: See Care Plan and Vynca application for related entries.   Goals Addressed            This Visit's Progress   . "I could use some help around the house when I get back home" (pt-stated)       Current Barriers:  [name] Knowledge Barriers related to resources available for ADL IADL limitations  Case Manager Clinical Goal(s):  [name] Over the next 60 days, patient will work with BSW to address needs related to ADL IADL limitations; obtaining [place]  . Over the next 60 days, BSW will collaborate with RN Care Manager to address care management and care coordination needs  Interventions:  . [place] regarding status of request for PCS.  Unable to process due to date of last provider visit being illegible.  [name] Collaborated with RNCM, Kelli Churn, to assist with provider correcting form.  . Faxed corrected form to [place] . Called son and provided update.   . Informed son I will contact [facility] on 06/11/19 to ensure that form is being processed. .  . Patient Self Care Activities:  . Patient verbalizes understanding of plan to work  with BSW to obtain [employer]  . Self administers medications as prescribed . Attends all scheduled provider appointments  Please see past updates related to this goal by clicking on the "Past Updates" button in the selected goal          Telephone follow up appointment with care management team member scheduled for:06/11/19   Ronn Melena, [place] Worker [place] [phone]

## 2019-06-10 ENCOUNTER — Other Ambulatory Visit: Payer: Self-pay | Admitting: Internal Medicine

## 2019-06-10 DIAGNOSIS — E1122 Type 2 diabetes mellitus with diabetic chronic kidney disease: Secondary | ICD-10-CM | POA: Diagnosis not present

## 2019-06-10 DIAGNOSIS — E039 Hypothyroidism, unspecified: Secondary | ICD-10-CM | POA: Diagnosis not present

## 2019-06-10 DIAGNOSIS — M1990 Unspecified osteoarthritis, unspecified site: Secondary | ICD-10-CM | POA: Diagnosis not present

## 2019-06-10 DIAGNOSIS — R1312 Dysphagia, oropharyngeal phase: Secondary | ICD-10-CM | POA: Diagnosis not present

## 2019-06-10 DIAGNOSIS — E1165 Type 2 diabetes mellitus with hyperglycemia: Secondary | ICD-10-CM | POA: Diagnosis not present

## 2019-06-10 DIAGNOSIS — N189 Chronic kidney disease, unspecified: Secondary | ICD-10-CM | POA: Diagnosis not present

## 2019-06-10 DIAGNOSIS — H3530 Unspecified macular degeneration: Secondary | ICD-10-CM | POA: Diagnosis not present

## 2019-06-10 DIAGNOSIS — E1142 Type 2 diabetes mellitus with diabetic polyneuropathy: Secondary | ICD-10-CM | POA: Diagnosis not present

## 2019-06-10 DIAGNOSIS — H409 Unspecified glaucoma: Secondary | ICD-10-CM | POA: Diagnosis not present

## 2019-06-10 DIAGNOSIS — Z79899 Other long term (current) drug therapy: Secondary | ICD-10-CM | POA: Diagnosis not present

## 2019-06-10 DIAGNOSIS — I129 Hypertensive chronic kidney disease with stage 1 through stage 4 chronic kidney disease, or unspecified chronic kidney disease: Secondary | ICD-10-CM | POA: Diagnosis not present

## 2019-06-10 DIAGNOSIS — Z9011 Acquired absence of right breast and nipple: Secondary | ICD-10-CM | POA: Diagnosis not present

## 2019-06-10 DIAGNOSIS — G9340 Encephalopathy, unspecified: Secondary | ICD-10-CM | POA: Diagnosis not present

## 2019-06-10 DIAGNOSIS — Z794 Long term (current) use of insulin: Secondary | ICD-10-CM | POA: Diagnosis not present

## 2019-06-10 DIAGNOSIS — I972 Postmastectomy lymphedema syndrome: Secondary | ICD-10-CM | POA: Diagnosis not present

## 2019-06-10 NOTE — Addendum Note (Signed)
Addended by: Aldine Contes on: 06/10/2019 02:20 PM   Modules accepted: Orders

## 2019-06-11 ENCOUNTER — Ambulatory Visit: Payer: Medicare Other

## 2019-06-11 DIAGNOSIS — I10 Essential (primary) hypertension: Secondary | ICD-10-CM

## 2019-06-11 DIAGNOSIS — Z794 Long term (current) use of insulin: Secondary | ICD-10-CM

## 2019-06-11 DIAGNOSIS — E1142 Type 2 diabetes mellitus with diabetic polyneuropathy: Secondary | ICD-10-CM

## 2019-06-11 DIAGNOSIS — N183 Chronic kidney disease, stage 3 unspecified: Secondary | ICD-10-CM

## 2019-06-11 NOTE — Chronic Care Management (AMB) (Signed)
  Care Management   Follow Up Note   06/11/2019 Name: [name] MRN: [id number] DOB: [birth year]  Referred by: Aldine Contes, MD Reason for referral : Care Coordination (PCS)   [name] is a 69 y.o. year old female who is a primary care patient of Aldine Contes, MD. The care management team was consulted for assistance with care management and care coordination needs.    Review of patient status, including review of consultants reports, relevant laboratory and other test results, and collaboration with appropriate care team members and the patient's provider was performed as part of comprehensive patient evaluation and provision of chronic care management services.    SDOH (Social Determinants of Health) assessments performed: No See Care Plan activities for detailed interventions related to [facility])     Advanced Directives: See Care Plan and Vynca application for related entries.   Goals Addressed            This Visit's Progress   . "I could use some help around the house when I get back home" (pt-stated)       Current Barriers:  [name] Knowledge Barriers related to resources available for ADL IADL limitations  Case Manager Clinical Goal(s):  [name] Over the next 60 days, patient will work with BSW to address needs related to ADL IADL limitations; obtaining [place]  . Over the next 60 days, BSW will collaborate with RN Care Manager to address care management and care coordination needs  Interventions:  . [place] regarding status of request for PCS.  Representative states that assessment has been scheduled for 06/17/19 . Attempted to contact son for follow up but had to leave a message.   . Patient Self Care Activities:  . Patient verbalizes understanding of plan to work with BSW to obtain [employer]  . Self administers medications as prescribed . Attends all scheduled provider appointments  Please see past updates related  to this goal by clicking on the "Past Updates" button in the selected goal          A HIPPA compliant phone message was left for the patient's son providing contact information and requesting a return call.    Ronn Melena, [place] Coordination Social Worker [place] [phone]

## 2019-06-11 NOTE — Patient Instructions (Signed)
Visit Information  Goals Addressed            This Visit's Progress   . "I could use some help around the house when I get back home" (pt-stated)       Current Barriers:  [name] Knowledge Barriers related to resources available for ADL IADL limitations  Case Manager Clinical Goal(s):  [name] Over the next 60 days, patient will work with BSW to address needs related to ADL IADL limitations; obtaining [place]  . Over the next 60 days, BSW will collaborate with RN Care Manager to address care management and care coordination needs  Interventions:  . [place] regarding status of request for PCS.  Representative states that assessment has been scheduled for 06/17/19 . Attempted to contact son for follow up but had to leave a message.   . Patient Self Care Activities:  . Patient verbalizes understanding of plan to work with BSW to obtain [employer]  . Self administers medications as prescribed . Attends all scheduled provider appointments  Please see past updates related to this goal by clicking on the "Past Updates" button in the selected goal           A HIPPA compliant phone message was left for the patient's sone providing contact information and requesting a return call.      Ronn Melena, [place] Coordination Social Worker [place] [phone]

## 2019-06-14 ENCOUNTER — Ambulatory Visit: Payer: Self-pay

## 2019-06-14 DIAGNOSIS — R1312 Dysphagia, oropharyngeal phase: Secondary | ICD-10-CM | POA: Diagnosis not present

## 2019-06-14 DIAGNOSIS — E039 Hypothyroidism, unspecified: Secondary | ICD-10-CM | POA: Diagnosis not present

## 2019-06-14 DIAGNOSIS — E1122 Type 2 diabetes mellitus with diabetic chronic kidney disease: Secondary | ICD-10-CM | POA: Diagnosis not present

## 2019-06-14 DIAGNOSIS — E1142 Type 2 diabetes mellitus with diabetic polyneuropathy: Secondary | ICD-10-CM

## 2019-06-14 DIAGNOSIS — I10 Essential (primary) hypertension: Secondary | ICD-10-CM

## 2019-06-14 DIAGNOSIS — E1165 Type 2 diabetes mellitus with hyperglycemia: Secondary | ICD-10-CM | POA: Diagnosis not present

## 2019-06-14 DIAGNOSIS — I972 Postmastectomy lymphedema syndrome: Secondary | ICD-10-CM | POA: Diagnosis not present

## 2019-06-14 DIAGNOSIS — I129 Hypertensive chronic kidney disease with stage 1 through stage 4 chronic kidney disease, or unspecified chronic kidney disease: Secondary | ICD-10-CM | POA: Diagnosis not present

## 2019-06-14 DIAGNOSIS — H3530 Unspecified macular degeneration: Secondary | ICD-10-CM | POA: Diagnosis not present

## 2019-06-14 DIAGNOSIS — Z9011 Acquired absence of right breast and nipple: Secondary | ICD-10-CM | POA: Diagnosis not present

## 2019-06-14 DIAGNOSIS — Z794 Long term (current) use of insulin: Secondary | ICD-10-CM | POA: Diagnosis not present

## 2019-06-14 DIAGNOSIS — N183 Chronic kidney disease, stage 3 unspecified: Secondary | ICD-10-CM

## 2019-06-14 DIAGNOSIS — N189 Chronic kidney disease, unspecified: Secondary | ICD-10-CM | POA: Diagnosis not present

## 2019-06-14 DIAGNOSIS — H409 Unspecified glaucoma: Secondary | ICD-10-CM | POA: Diagnosis not present

## 2019-06-14 DIAGNOSIS — G9340 Encephalopathy, unspecified: Secondary | ICD-10-CM | POA: Diagnosis not present

## 2019-06-14 DIAGNOSIS — M1990 Unspecified osteoarthritis, unspecified site: Secondary | ICD-10-CM | POA: Diagnosis not present

## 2019-06-14 DIAGNOSIS — Z79899 Other long term (current) drug therapy: Secondary | ICD-10-CM | POA: Diagnosis not present

## 2019-06-14 NOTE — Patient Instructions (Signed)
Visit Information  Goals Addressed            This Visit's Progress   . "I could use some help around the house when I get back home" (pt-stated)       Current Barriers:  [name] Knowledge Barriers related to resources available for ADL IADL limitations  Case Manager Clinical Goal(s):  [name] Over the next 60 days, patient will work with BSW to address needs related to ADL IADL limitations; obtaining [place]  . Over the next 60 days, BSW will collaborate with RN Care Manager to address care management and care coordination needs  Interventions:  . Received return call from son in response to message left last week . Informed son that, per [employer], patient is scheduled for assessment on 06/17/19.  Son was not aware that assessment had been scheduled . [name] [place] to ensure that assessment has been scheduled and confirmed time. . Called son back and confirmed that assessment is scheduled for 06/17/19 between 8:30 AM and 9:30 AM  . Patient Self Care Activities:  . Patient verbalizes understanding of plan to work with BSW to obtain [employer]  . Self administers medications as prescribed . Attends all scheduled provider appointments  Please see past updates related to this goal by clicking on the "Past Updates" button in the selected goal         Patient's son verbalizes understanding of instructions provided today.   Telephone follow up appointment with care management team member scheduled for:06/18/19    Ronn Melena, [place] Coordination Social Worker [place] [phone]

## 2019-06-14 NOTE — Progress Notes (Signed)
Internal Medicine Clinic Resident  I have personally reviewed this encounter including the documentation in this note and/or discussed this patient with the care management provider. I will address any urgent items identified by the care management provider and will communicate my actions to the patient's PCP. I have reviewed the patient's CCM visit with my supervising attending, Dr Evette Doffing.  Harvie Heck, MD  Internal Medicine, PGY-1 06/14/2019

## 2019-06-14 NOTE — Chronic Care Management (AMB) (Signed)
  Care Management   Follow Up Note   06/14/2019 Name: [name] MRN: [id number] DOB: 1950/03/12  Referred by: Christy Contes, MD Reason for referral : Care Coordination (PCS)   [name] is a 69 y.o. year old female who is a primary care patient of Christy Contes, MD. The care management team was consulted for assistance with care management and care coordination needs.    Review of patient status, including review of consultants reports, relevant laboratory and other test results, and collaboration with appropriate care team members and the patient's provider was performed as part of comprehensive patient evaluation and provision of chronic care management services.    SDOH (Social Determinants of Health) assessments performed: No See Care Plan activities for detailed interventions related to [facility])     Advanced Directives: See Care Plan and Vynca application for related entries.   Goals Addressed            This Visit's Progress   . "I could use some help around the house when I get back home" (pt-stated)       Current Barriers:  [name] Knowledge Barriers related to resources available for ADL IADL limitations  Case Manager Clinical Goal(s):  [name] Over the next 60 days, patient will work with BSW to address needs related to ADL IADL limitations; obtaining [place]  . Over the next 60 days, BSW will collaborate with RN Care Manager to address care management and care coordination needs  Interventions:  . Received return call from son in response to message left last week . Informed son that, per [employer], patient is scheduled for assessment on 06/17/19.  Son was not aware that assessment had been scheduled . [name] [place] to ensure that assessment has been scheduled and confirmed time. . Called son back and confirmed that assessment is scheduled for 06/17/19 between 8:30 AM and 9:30 AM  . Patient Self Care Activities:    . Patient verbalizes understanding of plan to work with BSW to obtain [employer]  . Self administers medications as prescribed . Attends all scheduled provider appointments  Please see past updates related to this goal by clicking on the "Past Updates" button in the selected goal          Telephone follow up appointment with care management team member scheduled for:06/18/19    [name], [place] Coordination Social Worker [place] [phone]

## 2019-06-14 NOTE — Progress Notes (Signed)
Internal Medicine Clinic Resident  I have personally reviewed this encounter including the documentation in this note and/or discussed this patient with the care management provider. I will address any urgent items identified by the care management provider and will communicate my actions to the patient's PCP. I have reviewed the patient's CCM visit with my supervising attending, Dr Raines.  Joshua K Lee, MD 06/14/2019   

## 2019-06-14 NOTE — Progress Notes (Signed)
Internal Medicine Clinic Resident  I have personally reviewed this encounter including the documentation in this note and/or discussed this patient with the care management provider. I will address any urgent items identified by the care management provider and will communicate my actions to the patient's PCP. I have reviewed the patient's CCM visit with my supervising attending, Dr Raines.  Cameka Rae K Kyleigh Nannini, MD 06/14/2019   

## 2019-06-14 NOTE — Progress Notes (Addendum)
Internal Medicine Clinic Resident  I have personally reviewed this encounter including the documentation in this note and/or discussed this patient with the care management provider. I will address any urgent items identified by the care management provider and will communicate my actions to the patient's PCP. I have reviewed the patient's CCM visit with my supervising attending, Dr Raines.  Mckenlee Mangham K Mclain Freer, MD 06/14/2019   

## 2019-06-15 ENCOUNTER — Telehealth: Payer: Self-pay | Admitting: Dietician

## 2019-06-15 DIAGNOSIS — M1990 Unspecified osteoarthritis, unspecified site: Secondary | ICD-10-CM | POA: Diagnosis not present

## 2019-06-15 DIAGNOSIS — E1142 Type 2 diabetes mellitus with diabetic polyneuropathy: Secondary | ICD-10-CM | POA: Diagnosis not present

## 2019-06-15 DIAGNOSIS — N189 Chronic kidney disease, unspecified: Secondary | ICD-10-CM | POA: Diagnosis not present

## 2019-06-15 DIAGNOSIS — R1312 Dysphagia, oropharyngeal phase: Secondary | ICD-10-CM | POA: Diagnosis not present

## 2019-06-15 DIAGNOSIS — I129 Hypertensive chronic kidney disease with stage 1 through stage 4 chronic kidney disease, or unspecified chronic kidney disease: Secondary | ICD-10-CM | POA: Diagnosis not present

## 2019-06-15 DIAGNOSIS — G9340 Encephalopathy, unspecified: Secondary | ICD-10-CM | POA: Diagnosis not present

## 2019-06-15 DIAGNOSIS — E1165 Type 2 diabetes mellitus with hyperglycemia: Secondary | ICD-10-CM | POA: Diagnosis not present

## 2019-06-15 DIAGNOSIS — E1122 Type 2 diabetes mellitus with diabetic chronic kidney disease: Secondary | ICD-10-CM | POA: Diagnosis not present

## 2019-06-15 DIAGNOSIS — I972 Postmastectomy lymphedema syndrome: Secondary | ICD-10-CM | POA: Diagnosis not present

## 2019-06-15 DIAGNOSIS — Z9011 Acquired absence of right breast and nipple: Secondary | ICD-10-CM | POA: Diagnosis not present

## 2019-06-15 DIAGNOSIS — Z794 Long term (current) use of insulin: Secondary | ICD-10-CM | POA: Diagnosis not present

## 2019-06-15 DIAGNOSIS — H3530 Unspecified macular degeneration: Secondary | ICD-10-CM | POA: Diagnosis not present

## 2019-06-15 DIAGNOSIS — H409 Unspecified glaucoma: Secondary | ICD-10-CM | POA: Diagnosis not present

## 2019-06-15 DIAGNOSIS — Z79899 Other long term (current) drug therapy: Secondary | ICD-10-CM | POA: Diagnosis not present

## 2019-06-15 DIAGNOSIS — E039 Hypothyroidism, unspecified: Secondary | ICD-10-CM | POA: Diagnosis not present

## 2019-06-15 NOTE — Telephone Encounter (Signed)
[name] says the strips we ordered only fit [name]'s meter; not hers. She needs strips for her meter.  [name]'s meter is a Accu aviva plus, her sis an Accu chek guideme. Call to pharmacy- May 5th picked up 400 strips for accu chek aviva so insurance will not allow more strips now. Pharmacist agreed to call insurance to see if they will allow and override.   Home health nurse wants to know if she can get back on her Dexcom CGM. Made an appointment to assist her for 06/30/19 if she is able to find/get her transmitter and sensors from her house to bring with her.

## 2019-06-16 ENCOUNTER — Encounter (HOSPITAL_COMMUNITY): Payer: Self-pay | Admitting: Pharmacy Technician

## 2019-06-16 ENCOUNTER — Emergency Department (HOSPITAL_COMMUNITY)
Admission: EM | Admit: 2019-06-16 | Discharge: 2019-06-17 | Disposition: A | Discharge status: Home / Self Care | Payer: Medicare Other | Attending: Emergency Medicine | Admitting: Emergency Medicine

## 2019-06-16 ENCOUNTER — Other Ambulatory Visit: Payer: Self-pay

## 2019-06-16 DIAGNOSIS — M25561 Pain in right knee: Secondary | ICD-10-CM

## 2019-06-16 DIAGNOSIS — N183 Chronic kidney disease, stage 3 unspecified: Secondary | ICD-10-CM | POA: Insufficient documentation

## 2019-06-16 DIAGNOSIS — Z7984 Long term (current) use of oral hypoglycemic drugs: Secondary | ICD-10-CM | POA: Diagnosis not present

## 2019-06-16 DIAGNOSIS — G9340 Encephalopathy, unspecified: Secondary | ICD-10-CM | POA: Diagnosis not present

## 2019-06-16 DIAGNOSIS — R519 Headache, unspecified: Secondary | ICD-10-CM | POA: Diagnosis present

## 2019-06-16 DIAGNOSIS — E1165 Type 2 diabetes mellitus with hyperglycemia: Secondary | ICD-10-CM | POA: Diagnosis not present

## 2019-06-16 DIAGNOSIS — Z79899 Other long term (current) drug therapy: Secondary | ICD-10-CM | POA: Diagnosis not present

## 2019-06-16 DIAGNOSIS — E876 Hypokalemia: Secondary | ICD-10-CM | POA: Diagnosis not present

## 2019-06-16 DIAGNOSIS — R42 Dizziness and giddiness: Secondary | ICD-10-CM | POA: Diagnosis not present

## 2019-06-16 DIAGNOSIS — R1312 Dysphagia, oropharyngeal phase: Secondary | ICD-10-CM | POA: Diagnosis not present

## 2019-06-16 DIAGNOSIS — M1990 Unspecified osteoarthritis, unspecified site: Secondary | ICD-10-CM | POA: Diagnosis not present

## 2019-06-16 DIAGNOSIS — E039 Hypothyroidism, unspecified: Secondary | ICD-10-CM | POA: Diagnosis not present

## 2019-06-16 DIAGNOSIS — Z794 Long term (current) use of insulin: Secondary | ICD-10-CM | POA: Diagnosis not present

## 2019-06-16 DIAGNOSIS — E1122 Type 2 diabetes mellitus with diabetic chronic kidney disease: Secondary | ICD-10-CM | POA: Diagnosis not present

## 2019-06-16 DIAGNOSIS — N189 Chronic kidney disease, unspecified: Secondary | ICD-10-CM | POA: Diagnosis not present

## 2019-06-16 DIAGNOSIS — I129 Hypertensive chronic kidney disease with stage 1 through stage 4 chronic kidney disease, or unspecified chronic kidney disease: Secondary | ICD-10-CM | POA: Diagnosis not present

## 2019-06-16 DIAGNOSIS — I972 Postmastectomy lymphedema syndrome: Secondary | ICD-10-CM | POA: Diagnosis not present

## 2019-06-16 DIAGNOSIS — E1142 Type 2 diabetes mellitus with diabetic polyneuropathy: Secondary | ICD-10-CM | POA: Diagnosis not present

## 2019-06-16 DIAGNOSIS — H409 Unspecified glaucoma: Secondary | ICD-10-CM | POA: Diagnosis not present

## 2019-06-16 DIAGNOSIS — Z9011 Acquired absence of right breast and nipple: Secondary | ICD-10-CM | POA: Diagnosis not present

## 2019-06-16 DIAGNOSIS — H3530 Unspecified macular degeneration: Secondary | ICD-10-CM | POA: Diagnosis not present

## 2019-06-16 LAB — BASIC METABOLIC PANEL
Anion gap: 12 (ref 5–15)
BUN: 6 mg/dL — ABNORMAL LOW (ref 8–23)
CO2: 25 mmol/L (ref 22–32)
Calcium: 9.7 mg/dL (ref 8.9–10.3)
Chloride: 104 mmol/L (ref 98–111)
Creatinine, Ser: 1.09 mg/dL — ABNORMAL HIGH (ref 0.44–1.00)
GFR calc Af Amer: >60 mL/min (ref >60)
GFR calc non Af Amer: 52 mL/min — ABNORMAL LOW (ref >60)
Glucose, Bld: 211 mg/dL — ABNORMAL HIGH (ref 70–99)
Potassium: 3.1 mmol/L — ABNORMAL LOW (ref 3.5–5.1)
Sodium: 141 mmol/L (ref 135–145)

## 2019-06-16 LAB — CBC
HCT: 36.3 % (ref 36.0–46.0)
Hemoglobin: 12.1 g/dL (ref 12.0–15.0)
MCH: 30.6 pg (ref 26.0–34.0)
MCHC: 33.3 g/dL (ref 30.0–36.0)
MCV: 91.9 fL (ref 80.0–100.0)
Platelets: 251 10*3/uL (ref 150–400)
RBC: 3.95 MIL/uL (ref 3.87–5.11)
RDW: 12 % (ref 11.5–15.5)
WBC: 7.3 10*3/uL (ref 4.0–10.5)
nRBC: 0 % (ref 0.0–0.2)

## 2019-06-16 LAB — CBG MONITORING, ED: Glucose-Capillary: 151 mg/dL — ABNORMAL HIGH (ref 70–99)

## 2019-06-16 MED ORDER — SODIUM CHLORIDE 0.9% FLUSH: 3.0000 mL | Freq: Once | INTRAVENOUS | Status: DC

## 2019-06-16 MED ORDER — POTASSIUM CHLORIDE CRYS ER 20 MEQ PO TBCR
40.0000 meq | EXTENDED_RELEASE_TABLET | Freq: Once | ORAL | Status: AC
  Administered 2019-06-16: 40 meq via ORAL
  Filled 2019-06-16: qty 2

## 2019-06-16 NOTE — ED Provider Notes (Signed)
[facility] EMERGENCY DEPARTMENT Provider Note   CSN: [id number] Arrival date & time: 06/16/19  1350     History Chief Complaint  Patient presents with  . Headache  . Dizziness  . Knee Pain    [name] is a 69 y.o. female.  HPI Patient is a completely asymptomatic 69 year old female with a past medical history significant for arthritis, bilateral hip pain, depression, diabetic neuropathy, chronic dizziness, bilateral dry eyes, frequent falls, HLD, HTN, hypokalemia, hypomagnesemia, hypothyroidism, DM 2, chronic renal insufficiency and seizure-like activity  Patient states that she initially came to the emergency department because she had some right knee pain when she was at physical therapy this morning.  She is he also informs me that she got up this morning ate some oatmeal and then felt "not quite" and states that this concerned her.  She states that she was concerned that her blood sugar was too high as she has any seizure-like activity once in the past several months ago where her blood sugar was severely elevated and was made her feel quite funny and she was concerned that her blood sugar was high today.  She is joined at bedside by her son who states that he frequently checks on her.  She ambulates with a walker at home and is still undergoing physical therapy for her frequent falls and dizziness which, doubled with her gait abnormality/hesitancy, makes her a fall risk.  She states that physical therapy has been going well however today when she was doing it she started having some right knee pain.  She denies any trauma and states that the pain seems to gone away at this time.      Past Medical History:  Diagnosis Date  . Arthritis   . Bilateral hip pain 02/24/2015  . Borderline glaucoma of both eyes   . Depression   . Diabetic polyneuropathy ([place])   . Diverticulosis of colon   . Dizziness 12/05/2017  . Dry eyes, bilateral   . Feeling of incomplete  bladder emptying   . Frequent falls 01/08/2018  . History of breast cancer oncologist-  dr Waymon Budge-- per lov note no recurrence   dx 04/ 1999 --- Stage 3B-- s/p  chemotherapy then right mastectomy then concurrent chemoradiation therapy  . History of urinary retention    01/ 2018  . Hydronephrosis of right kidney   . Hyperlipidemia   . Hypertension   . Hypokalemia 12/09/2016  . Hypomagnesemia 12/09/2016  . Hypothyroidism   . Insulin dependent type 2 diabetes mellitus [facility])    endocrinologist-  dr Cruzita Lederer---  last A1c 8.7 on 02-20-2016  . Lymphedema of upper extremity    right  . Macular degeneration, left eye    followed by dr Zigmund Daniel  . Renal insufficiency   . Seizure-like activity ([place]) 04/17/2019  . Urgency of urination     Patient Active Problem List   Diagnosis Date Noted  . Carpal tunnel syndrome 06/01/2019  . Left leg pain 03/15/2019  . Cognitive impairment 01/13/2019  . Visual hallucinations 01/12/2019  . Insomnia 06/11/2018  . Hydronephrosis 04/01/2017  . Sciatica of left side without back pain 01/30/2017  . Neuropathy 12/09/2016  . Genetic testing 11/27/2015  . Preventative health care 08/26/2012  . HTN, goal below 140/90 08/13/2012  . CKD (chronic kidney disease) stage 3, GFR 30-59 ml/min 08/13/2012  . Glaucoma associated with systemic syndromes(365.44) 08/07/2012  . Hypothyroidism 04/16/2006  . Anemia 04/16/2006  . Depression 04/16/2006  . LOW BACK PAIN  04/16/2006  . BREAST CANCER, HX OF 04/16/2006  . Hyperlipidemia 10/13/2003  . Type 2 diabetes mellitus with diabetic polyneuropathy, with long-term current use of insulin ([place]) 04/16/1991    Past Surgical History:  Procedure Laterality Date  . CARPAL TUNNEL RELEASE Right 2017   and tendon repair  . CATARACT EXTRACTION W/ INTRAOCULAR LENS  IMPLANT, BILATERAL  2017  . CYSTO/  RIGHT RETROGRADE PYELOGRAM/  UNROOFING RIGHT URETEROCELE  09/18/2000  . CYSTOSCOPY/RETROGRADE/URETEROSCOPY Right 05/09/2016    Procedure: CYSTOSCOPY and right RETROGRADE;  Surgeon: Irine Seal, MD;  Location[facility];  Service: Urology;  Laterality: Right;  . FINGER SURGERY Left x2 prior to 09-09-2014  . I & D EXTREMITY Left 09/09/2014   Procedure: IRRIGATION AND DEBRIDEMENT LEFT THUMB DISTAL PHALANX;  Surgeon: Daryll Brod, MD;  Location: [place];  Service: Orthopedics;  Laterality: Left;  [name] MASTECTOMY Right 1999   w/  Node dissection's  . PORT-A-CATH REMOVAL  05/01/1999  . TUBAL LIGATION    . VAGINAL HYSTERECTOMY  1970's  . WRIST GANGLION EXCISION Right 2016     OB History   No obstetric history on file.     Family History  Problem Relation Age of Onset  . Heart disease Father   . Diabetes Father   . Stroke Sister   . Anuerysm Sister 78       brain; maternal half-sister  . Heart disease Brother   . Anuerysm Brother 79       aortic; maternal half-brother  . Breast cancer Daughter 71       negative genetic testing in 2015  . Heart attack Daughter        78-47  . Diabetes Paternal Grandmother   . Stroke Paternal Grandfather   . Anuerysm Brother        NOS type; full brother  . Fibroids Daughter        s/p hysterectomy at 12y  . Brain cancer Maternal Uncle        dx. older than 78; NOS type  . Brain cancer Cousin        maternal 1st cousin; d. early 6s; NOS type  . Deafness Paternal Uncle        prelingual    Social History   Tobacco Use  . Smoking status: Never Smoker  . Smokeless tobacco: Never Used  Substance Use Topics  . Alcohol use: No    Alcohol/week: 0.0 standard drinks  . Drug use: No    Home Medications Prior to Admission medications   Medication Sig Start Date End Date Taking? Authorizing Provider  Accu-Chek Softclix Lancets lancets Check blood sugar 5 times a day as instructed 06/01/19   Aldine Contes, MD  Acetaminophen 325 MG CAPS Take 325 mg by mouth every 6 (six) hours as needed (for pain). 03/15/19   Jose Persia, MD  ALPHAGAN P  0.1 % SOLN Place 1 drop into both eyes 3 (three) times daily. 06/09/17   [provider]  Besifloxacin HCl (BESIVANCE) 0.6 % SUSP Besivance 0.6 % eye drops,suspension  INSTILL 1 DROP INTO THE LEFT EYE QID X 2 DAYS AFTER EACH MONTHLY EYE INJECTION    [provider]  Blood Glucose Monitoring Suppl (ACCU-CHEK GUIDE ME) w/Device KIT Check blood sugar 5 times a day 06/01/19   Aldine Contes, MD  busPIRone (BUSPAR) 5 MG tablet Take 1 tablet (5 mg total) by mouth 2 (two) times daily. 05/27/19   Ladona Horns, MD  calcium citrate-vitamin D (  CITRACAL+D) 315-200 MG-UNIT tablet Take 1 tablet by mouth 2 (two) times daily. Patient not taking: Reported on 11/20/2017 06/06/16 12/27/17  Aldine Contes, MD  Cholecalciferol (VITAMIN D3) 2000 units TABS Take 2,000 Units at bedtime by mouth.     [provider]  ciprofloxacin (CILOXAN) 0.3 % ophthalmic ointment     [provider]  cycloSPORINE (RESTASIS) 0.05 % ophthalmic emulsion Restasis MultiDose 0.05 % eye drops  INT 1 GTT IN EACH EYE BID    [provider]  erythromycin ophthalmic ointment APPLY A SMALL AMOUNT INTO LEFT EYE TID 09/09/17   [provider]  glucose 4 GM chewable tablet Chew 4 tablets (16 g total) by mouth as needed for low blood sugar. 05/28/12   Dominic Pea, DO  glucose blood (ACCU-CHEK GUIDE) test strip Check blood sugar 5 times per day 06/01/19   Aldine Contes, MD  Insulin Pen Needle (BD PEN NEEDLE NANO U/F) 32G X 4 MM MISC USE AS DIRECTED 12/04/18   Aldine Contes, MD  ketorolac (ACULAR) 0.5 % ophthalmic solution Place 1 drop into both eyes 3 (three) times daily. 06/11/17   [provider]  levothyroxine (SYNTHROID) 88 MCG tablet Take 1 tablet (88 mcg total) by mouth daily before breakfast. 06/01/19   Aldine Contes, MD  loperamide (IMODIUM) 2 MG capsule Take 1 capsule (2 mg total) by mouth as needed for diarrhea or loose stools. 06/17/19   Tedd Sias, PA  losartan  (COZAAR) 25 MG tablet Take 12.5 mg by mouth daily.    [provider]  metFORMIN (GLUCOPHAGE) 1000 MG tablet Take 1 tablet (1,000 mg total) by mouth 2 (two) times daily with a meal. 05/27/19   Ladona Horns, MD  Nutritional Supplements (GLUCERNA SNACK SHAKE) LIQD Take 1 Dose by mouth 3 (three) times daily as needed. Patient not taking: Reported on 05/26/2019 08/06/17   Aldine Contes, MD  Olopatadine HCl (PATADAY) 0.2 % SOLN Place 1 drop into both eyes daily as needed (dry eyes).     [provider]  Polyethyl Glycol-Propyl Glycol (SYSTANE) 0.4-0.3 % GEL ophthalmic gel Place 1 application daily as needed into both eyes (dry eyes/irritation).     [provider]  potassium chloride SA (KLOR-CON) 20 MEQ tablet Take 1 tablet (20 mEq total) by mouth daily for 3 days. 06/17/19 06/20/19  Tedd Sias, PA  prednisoLONE acetate (PRED FORTE) 1 % ophthalmic suspension  09/23/17   [provider]  rosuvastatin (CRESTOR) 20 MG tablet Take 1 tablet (20 mg total) by mouth daily. 05/27/19   Ladona Horns, MD  Semaglutide,0.25 or 0.5MG/DOS, (OZEMPIC, 0.25 OR 0.5 MG/DOSE,) 2 MG/1.5ML SOPN Inject 0.5 mg into the skin once a week. 05/27/19   Ladona Horns, MD  valACYclovir (VALTREX) 500 MG tablet TK 1 T PO QD 09/17/17   [provider]  venlafaxine XR (EFFEXOR-XR) 150 MG 24 hr capsule TAKE 1 CAPSULE BY MOUTH DAILY WITH BREAKFAST 05/27/19   Ladona Horns, MD    Allergies    Gabapentin, Meclizine hcl, Penicillins, and Sulfa antibiotics  Review of Systems   Review of Systems  Constitutional: Negative for chills and fever.  HENT: Negative for congestion.   Eyes: Negative for pain.  Respiratory: Negative for cough and shortness of breath.   Cardiovascular: Negative for chest pain and leg swelling.  Gastrointestinal: Negative for abdominal pain and vomiting.  Genitourinary: Negative for dysuria.  Musculoskeletal: Negative for myalgias.       Right knee pain-resolved at this  time  Skin: Negative for rash.  Neurological: Positive for dizziness (Resolved at this time) and headaches (Resolved at this time).    Physical Exam Updated Vital Signs BP (!) 176/85 (BP Location: Right Arm)   Pulse 94   Temp 97.9 F (36.6 C) (Oral)   Resp 16   SpO2 97%   Physical Exam Vitals and nursing note reviewed.  Constitutional:      General: She is not in acute distress.    Comments: Thin, frail-appearing 69 year old female appears stated age, she is able answer questions appropriately follow commands.  Pleasant and in no acute distress.  HENT:     Head: Normocephalic and atraumatic.     Nose: Nose normal.     Mouth/Throat:     Mouth: Mucous membranes are moist.  Eyes:     General: No scleral icterus. Cardiovascular:     Rate and Rhythm: Normal rate and regular rhythm.     Pulses: Normal pulses.     Heart sounds: Normal heart sounds.  Pulmonary:     Effort: Pulmonary effort is normal. No respiratory distress.     Breath sounds: No wheezing.  Abdominal:     Palpations: Abdomen is soft.     Tenderness: There is no abdominal tenderness. There is no guarding or rebound.  Musculoskeletal:     Cervical back: Normal range of motion.     Right lower leg: No edema.     Left lower leg: No edema.     Comments: Strength 5/5 in all extremities  Skin:    General: Skin is warm and dry.     Capillary Refill: Capillary refill takes less than 2 seconds.     Comments: Skin is warm dry with no ulcers or rashes  Neurological:     Mental Status: She is alert. Mental status is at baseline.     Comments: Alert and oriented to self, place, time and event.   Speech is fluent, clear without dysarthria or dysphasia.   Strength 5/5 in upper/lower extremities  Sensation intact in upper/lower extremities   Normal gait.  Negative Romberg. No pronator drift.  Normal finger-to-nose and feet tapping.  CN I not tested  CN II grossly intact visual fields bilaterally. Did not visualize  posterior eye.   CN III, IV, VI PERRLA and EOMs intact bilaterally  CN V Intact sensation to sharp and light touch to the face  CN VII facial movements symmetric  CN VIII not tested  CN IX, X no uvula deviation, symmetric rise of soft palate  CN XI 5/5 SCM and trapezius strength bilaterally  CN XII Midline tongue protrusion, symmetric L/R movements   Psychiatric:        Mood and Affect: Mood normal.        Behavior: Behavior normal.     ED Results / Procedures / Treatments   Labs (all labs ordered are listed, but only abnormal results are displayed) Labs Reviewed  BASIC METABOLIC PANEL - Abnormal; Notable for the following components:      Result Value   Potassium 3.1 (*)    Glucose, Bld 211 (*)    BUN 6 (*)    Creatinine, Ser 1.09 (*)    GFR calc non Af Amer 52 (*)    All other components within normal limits  CBG MONITORING, ED - Abnormal; Notable for the following components:   Glucose-Capillary 151 (*)    All other components within normal limits  CBC    EKG EKG Interpretation  Date/Time:  Wednesday Jun 16 2019 22:25:03 EDT Ventricular Rate:  92 PR Interval:    QRS Duration: 91 QT Interval:  373 QTC Calculation: 462 R Axis:   -61 Text Interpretation: Sinus rhythm Left anterior fascicular block Abnormal R-wave progression, early transition Left ventricular hypertrophy Confirmed by Randal Buba, April ([id number]) on 06/17/2019 1:00:42 AM   Radiology No results found.  Procedures Procedures (including critical care time)  Medications Ordered in ED Medications  sodium chloride flush (NS) 0.9 % injection 3 mL (3 mLs Intravenous Not Given 06/16/19 2313)  potassium chloride SA (KLOR-CON) CR tablet 40 mEq (40 mEq Oral Given 06/16/19 2319)    ED Course  I have reviewed the triage vital signs and the nursing notes.  Pertinent labs & imaging results that were available during my care of the patient were reviewed by me and considered in my medical decision making (see chart  for details).    MDM Rules/Calculators/A&P                      Patient is 69 year old female with past medical history detailed above presented today for lightheadedness, dizziness, headache and right knee pain which have all resolved without intervention in her 10-hour stay in the waiting room.  Patient is neurologically intact, physical exam is unremarkable.  She is well-appearing and has no complaints at this time.  I did recommend loperamide and potassium supplements as she has low potassium and has diarrhea.  She has no abdominal pain, nausea or vomiting.  Her abdominal exam is benign.   CBC without leukocytosis or anemia.  CMP without any other abnormalities.  Patient will follow up with her PCP and have made exam evaluated this time.  She has no urinary symptoms I discussed with my physician and we opted to discontinue urinalysis that was ordered by nursing staff that she is unlikely to have any true urinary tract infection and testing her urine is more likely to find a false positive and unnecessarily treated antibiotics in the setting the patient with diarrhea already.  EKG is nonischemic.  No changes from prior.  I discussed this case with my attending physician who cosigned this note including patient's presenting symptoms, physical exam, and planned diagnostics and interventions. Attending physician stated agreement with plan or made changes to plan which were implemented.   Attending physician assessed patient at bedside.  We will discharge patient with 3 days of potassium supplements she will follow-up with primary care doctor for reevaluation potassium.  She has a history of low potassium and given that she has chronic diarrhea with her Metformin I suspect that this is the cause of her low potassium today.   My reassessment patient states that she would like to go home at this time.  This is reasonable given her symptoms and benign work-up.  Her hypokalemia has been treated with  potassium and she is asymptomatic from her chronic dizziness at this time.  She did eat and drink in the ER during her work-up and states that she would like to go home and rest at this time.  I discussed return precautions with her.  She will follow up with PCP.  Final Clinical Impression(s) / ED Diagnoses Final diagnoses:  Hypokalemia  Dizziness  Right knee pain, unspecified chronicity    Rx / DC Orders ED Discharge Orders         Ordered    potassium chloride SA (KLOR-CON) 20 MEQ tablet  Daily,   Status:  Discontinued  06/17/19 0003    loperamide (IMODIUM) 2 MG capsule  As needed,   Status:  Discontinued     06/17/19 0003    loperamide (IMODIUM) 2 MG capsule  As needed     06/17/19 0016    potassium chloride SA (KLOR-CON) 20 MEQ tablet  Daily     06/17/19 0016           Tedd Sias, [place] 06/17/19 0109    Margette Fast, MD 06/17/19 [phone]

## 2019-06-16 NOTE — ED Triage Notes (Signed)
Pt bib family with reports of headache and dizziness as well as R knee pain. Pt was working with PT when this happened. During triage pt with "slight" headache and dizziness. States symptoms are improved. Pt main complaint is R knee pain. States it feels like something is "stabbing" her knee. Pt in NAD.

## 2019-06-17 DIAGNOSIS — E1142 Type 2 diabetes mellitus with diabetic polyneuropathy: Secondary | ICD-10-CM | POA: Diagnosis not present

## 2019-06-17 DIAGNOSIS — Z79899 Other long term (current) drug therapy: Secondary | ICD-10-CM | POA: Diagnosis not present

## 2019-06-17 DIAGNOSIS — E1122 Type 2 diabetes mellitus with diabetic chronic kidney disease: Secondary | ICD-10-CM | POA: Diagnosis not present

## 2019-06-17 DIAGNOSIS — E039 Hypothyroidism, unspecified: Secondary | ICD-10-CM | POA: Diagnosis not present

## 2019-06-17 DIAGNOSIS — H3530 Unspecified macular degeneration: Secondary | ICD-10-CM | POA: Diagnosis not present

## 2019-06-17 DIAGNOSIS — I129 Hypertensive chronic kidney disease with stage 1 through stage 4 chronic kidney disease, or unspecified chronic kidney disease: Secondary | ICD-10-CM | POA: Diagnosis not present

## 2019-06-17 DIAGNOSIS — R1312 Dysphagia, oropharyngeal phase: Secondary | ICD-10-CM | POA: Diagnosis not present

## 2019-06-17 DIAGNOSIS — Z794 Long term (current) use of insulin: Secondary | ICD-10-CM | POA: Diagnosis not present

## 2019-06-17 DIAGNOSIS — I972 Postmastectomy lymphedema syndrome: Secondary | ICD-10-CM | POA: Diagnosis not present

## 2019-06-17 DIAGNOSIS — E1165 Type 2 diabetes mellitus with hyperglycemia: Secondary | ICD-10-CM | POA: Diagnosis not present

## 2019-06-17 DIAGNOSIS — G9340 Encephalopathy, unspecified: Secondary | ICD-10-CM | POA: Diagnosis not present

## 2019-06-17 DIAGNOSIS — Z9011 Acquired absence of right breast and nipple: Secondary | ICD-10-CM | POA: Diagnosis not present

## 2019-06-17 DIAGNOSIS — M1990 Unspecified osteoarthritis, unspecified site: Secondary | ICD-10-CM | POA: Diagnosis not present

## 2019-06-17 DIAGNOSIS — H409 Unspecified glaucoma: Secondary | ICD-10-CM | POA: Diagnosis not present

## 2019-06-17 DIAGNOSIS — N189 Chronic kidney disease, unspecified: Secondary | ICD-10-CM | POA: Diagnosis not present

## 2019-06-17 MED ORDER — LOPERAMIDE HCL 2 MG PO CAPS
2.0000 mg | ORAL_CAPSULE | ORAL | 0 refills | Status: DC | PRN
Start: 2019-06-17 — End: 2020-05-23

## 2019-06-17 MED ORDER — POTASSIUM CHLORIDE CRYS ER 20 MEQ PO TBCR
20.0000 meq | EXTENDED_RELEASE_TABLET | Freq: Every day | ORAL | 0 refills | Status: DC
Start: 2019-06-17 — End: 2019-06-17

## 2019-06-17 MED ORDER — LOPERAMIDE HCL 2 MG PO CAPS
2.0000 mg | ORAL_CAPSULE | ORAL | 0 refills | Status: DC | PRN
Start: 2019-06-17 — End: 2019-06-17

## 2019-06-17 MED ORDER — POTASSIUM CHLORIDE CRYS ER 20 MEQ PO TBCR
20.0000 meq | EXTENDED_RELEASE_TABLET | Freq: Every day | ORAL | 0 refills | Status: DC
Start: 2019-06-17 — End: 2020-05-23

## 2019-06-17 NOTE — Telephone Encounter (Signed)
Patient and son called about getting her test strips. Explained to them that an old prescription was filled per pharmacist for a meter she no longer has and is no longer available. He states understanding, will call [facility] to see if the pharmacist was abel to get the insurance company to allow them to fill more strips  and has the [employer] meter himself and can use the strips to check her blood sugar as needed. I will mail then a logbook to write her blood sugars down to bring to her appointments if they are using the same meter.

## 2019-06-17 NOTE — ED Notes (Signed)
Pt verbalized understanding of discharge instructions. Follow up care and prescriptions reviewed, pt had no further questions. 

## 2019-06-17 NOTE — Discharge Instructions (Addendum)
Your potassium was found to be low today.  I prescribed you 3 days of potassium supplements. Please follow-up with your primary care doctor to have your potassium rechecked.  Please eat plenty of food and drink plenty water as discussed  You may otherwise return to the emergency department for any new or concerning symptoms.  Use loperamide for diarrhea.  Please follow-up with your primary care doctor for this as well.

## 2019-06-18 ENCOUNTER — Ambulatory Visit: Payer: Medicare Other

## 2019-06-18 DIAGNOSIS — E1142 Type 2 diabetes mellitus with diabetic polyneuropathy: Secondary | ICD-10-CM

## 2019-06-18 DIAGNOSIS — M1990 Unspecified osteoarthritis, unspecified site: Secondary | ICD-10-CM | POA: Diagnosis not present

## 2019-06-18 DIAGNOSIS — N183 Chronic kidney disease, stage 3 unspecified: Secondary | ICD-10-CM

## 2019-06-18 DIAGNOSIS — H409 Unspecified glaucoma: Secondary | ICD-10-CM | POA: Diagnosis not present

## 2019-06-18 DIAGNOSIS — N189 Chronic kidney disease, unspecified: Secondary | ICD-10-CM | POA: Diagnosis not present

## 2019-06-18 DIAGNOSIS — E1122 Type 2 diabetes mellitus with diabetic chronic kidney disease: Secondary | ICD-10-CM | POA: Diagnosis not present

## 2019-06-18 DIAGNOSIS — Z79899 Other long term (current) drug therapy: Secondary | ICD-10-CM | POA: Diagnosis not present

## 2019-06-18 DIAGNOSIS — Z794 Long term (current) use of insulin: Secondary | ICD-10-CM | POA: Diagnosis not present

## 2019-06-18 DIAGNOSIS — I972 Postmastectomy lymphedema syndrome: Secondary | ICD-10-CM | POA: Diagnosis not present

## 2019-06-18 DIAGNOSIS — I10 Essential (primary) hypertension: Secondary | ICD-10-CM

## 2019-06-18 DIAGNOSIS — Z9011 Acquired absence of right breast and nipple: Secondary | ICD-10-CM | POA: Diagnosis not present

## 2019-06-18 DIAGNOSIS — I129 Hypertensive chronic kidney disease with stage 1 through stage 4 chronic kidney disease, or unspecified chronic kidney disease: Secondary | ICD-10-CM | POA: Diagnosis not present

## 2019-06-18 DIAGNOSIS — G9340 Encephalopathy, unspecified: Secondary | ICD-10-CM | POA: Diagnosis not present

## 2019-06-18 DIAGNOSIS — R1312 Dysphagia, oropharyngeal phase: Secondary | ICD-10-CM | POA: Diagnosis not present

## 2019-06-18 DIAGNOSIS — H3530 Unspecified macular degeneration: Secondary | ICD-10-CM | POA: Diagnosis not present

## 2019-06-18 DIAGNOSIS — E039 Hypothyroidism, unspecified: Secondary | ICD-10-CM | POA: Diagnosis not present

## 2019-06-18 DIAGNOSIS — E1165 Type 2 diabetes mellitus with hyperglycemia: Secondary | ICD-10-CM | POA: Diagnosis not present

## 2019-06-18 NOTE — Progress Notes (Signed)
Internal Medicine Clinic Resident  I have personally reviewed this encounter including the documentation in this note and/or discussed this patient with the care management provider. I will address any urgent items identified by the care management provider and will communicate my actions to the patient's PCP. I have reviewed the patient's CCM visit with my supervising attending, Dr Butcher.  Ashleigh Arya, MD 06/18/2019    

## 2019-06-18 NOTE — Chronic Care Management (AMB) (Signed)
  Care Management   Follow Up Note   06/18/2019 Name: [name] MRN: [id number] DOB: 15-Apr-1950  Referred by: Aldine Contes, MD Reason for referral : Care Coordination (PCS)   [name] is a 69 y.o. year old female who is a primary care patient of Aldine Contes, MD. The care management team was consulted for assistance with care management and care coordination needs.    Review of patient status, including review of consultants reports, relevant laboratory and other test results, and collaboration with appropriate care team members and the patient's provider was performed as part of comprehensive patient evaluation and provision of chronic care management services.    SDOH (Social Determinants of Health) assessments performed: No See Care Plan activities for detailed interventions related to [name])     Advanced Directives: See Care Plan and Vyn[employer] application for related entries.   Goals Addressed            This Visit's Progress   . "I could use some help around the house when I get back home" (pt-stated)       Current Barriers:  [name] Knowledge Barriers related to resources available for ADL IADL limitations  Case Manager Clinical Goal(s):  [name] Over the next 60 days, patient will work with BSW to address needs related to ADL IADL limitations; obtaining [employer]  . Over the next 60 days, BSW will collaborate with RN Care Manager to address care management and care coordination needs  Interventions:  . Contacted patient and her son, [name], to ensure that assessment for PCS occurred yesterday.  Patient was assessed and is awaiting Determination Letter.  Per patient, they did not indicate at time of assessment whether or not she was approved, however she did provide them with her choice of service agency.   . Informed patient that another agency may have to be selected if one chosen one is not able to accommodate.   .  . Patient Self Care Activities:   . Patient verbalizes understanding of plan to work with BSW to obtain [employer]  . Self administers medications as prescribed . Attends all scheduled provider appointments  Please see past updates related to this goal by clicking on the "Past Updates" button in the selected goal          Telephone follow up appointment with care management team member scheduled for:07/01/19   Ronn Melena, [place] Worker [place] [phone]

## 2019-06-18 NOTE — Patient Instructions (Signed)
Visit Information  Goals Addressed            This Visit's Progress   . "I could use some help around the house when I get back home" (pt-stated)       Current Barriers:  [name] Knowledge Barriers related to resources available for ADL IADL limitations  Case Manager Clinical Goal(s):  [name] Over the next 60 days, patient will work with BSW to address needs related to ADL IADL limitations; obtaining [place]  . Over the next 60 days, BSW will collaborate with RN Care Manager to address care management and care coordination needs  Interventions:  . Contacted patient and her son, [name], to ensure that assessment for PCS occurred yesterday.  Patient was assessed and is awaiting Determination Letter.  Per patient, they did not indicate at time of assessment whether or not she was approved, however she did provide them with her choice of service agency.   . Informed patient that another agency may have to be selected if one chosen one is not able to accommodate.   .  . Patient Self Care Activities:  . Patient verbalizes understanding of plan to work with BSW to obtain [employer]  . Self administers medications as prescribed . Attends all scheduled provider appointments  Please see past updates related to this goal by clicking on the "Past Updates" button in the selected goal         Patient verbalizes understanding of instructions provided today.   Telephone follow up appointment with care management team member scheduled for:07/01/19    Ronn Melena, [place] Coordination Social Worker [place] [phone]

## 2019-06-21 ENCOUNTER — Telehealth: Payer: Self-pay | Admitting: *Deleted

## 2019-06-21 DIAGNOSIS — E1165 Type 2 diabetes mellitus with hyperglycemia: Secondary | ICD-10-CM | POA: Diagnosis not present

## 2019-06-21 DIAGNOSIS — H3530 Unspecified macular degeneration: Secondary | ICD-10-CM | POA: Diagnosis not present

## 2019-06-21 DIAGNOSIS — N189 Chronic kidney disease, unspecified: Secondary | ICD-10-CM | POA: Diagnosis not present

## 2019-06-21 DIAGNOSIS — E1122 Type 2 diabetes mellitus with diabetic chronic kidney disease: Secondary | ICD-10-CM | POA: Diagnosis not present

## 2019-06-21 DIAGNOSIS — E039 Hypothyroidism, unspecified: Secondary | ICD-10-CM | POA: Diagnosis not present

## 2019-06-21 DIAGNOSIS — Z794 Long term (current) use of insulin: Secondary | ICD-10-CM | POA: Diagnosis not present

## 2019-06-21 DIAGNOSIS — Z9011 Acquired absence of right breast and nipple: Secondary | ICD-10-CM | POA: Diagnosis not present

## 2019-06-21 DIAGNOSIS — I129 Hypertensive chronic kidney disease with stage 1 through stage 4 chronic kidney disease, or unspecified chronic kidney disease: Secondary | ICD-10-CM | POA: Diagnosis not present

## 2019-06-21 DIAGNOSIS — H409 Unspecified glaucoma: Secondary | ICD-10-CM | POA: Diagnosis not present

## 2019-06-21 DIAGNOSIS — Z79899 Other long term (current) drug therapy: Secondary | ICD-10-CM | POA: Diagnosis not present

## 2019-06-21 DIAGNOSIS — G9340 Encephalopathy, unspecified: Secondary | ICD-10-CM | POA: Diagnosis not present

## 2019-06-21 DIAGNOSIS — M1990 Unspecified osteoarthritis, unspecified site: Secondary | ICD-10-CM | POA: Diagnosis not present

## 2019-06-21 DIAGNOSIS — R1312 Dysphagia, oropharyngeal phase: Secondary | ICD-10-CM | POA: Diagnosis not present

## 2019-06-21 DIAGNOSIS — I972 Postmastectomy lymphedema syndrome: Secondary | ICD-10-CM | POA: Diagnosis not present

## 2019-06-21 DIAGNOSIS — E1142 Type 2 diabetes mellitus with diabetic polyneuropathy: Secondary | ICD-10-CM | POA: Diagnosis not present

## 2019-06-21 NOTE — Telephone Encounter (Signed)
Pharmacy called related to Rx: immodium quantity .[name] KitchenMarland KitchenEDCM located Rx to see that #12 was prescribed.  Relayed information to pharmacy.

## 2019-06-22 DIAGNOSIS — E1165 Type 2 diabetes mellitus with hyperglycemia: Secondary | ICD-10-CM | POA: Diagnosis not present

## 2019-06-22 DIAGNOSIS — R1312 Dysphagia, oropharyngeal phase: Secondary | ICD-10-CM | POA: Diagnosis not present

## 2019-06-22 DIAGNOSIS — E1122 Type 2 diabetes mellitus with diabetic chronic kidney disease: Secondary | ICD-10-CM | POA: Diagnosis not present

## 2019-06-22 DIAGNOSIS — I972 Postmastectomy lymphedema syndrome: Secondary | ICD-10-CM | POA: Diagnosis not present

## 2019-06-22 DIAGNOSIS — Z79899 Other long term (current) drug therapy: Secondary | ICD-10-CM | POA: Diagnosis not present

## 2019-06-22 DIAGNOSIS — I129 Hypertensive chronic kidney disease with stage 1 through stage 4 chronic kidney disease, or unspecified chronic kidney disease: Secondary | ICD-10-CM | POA: Diagnosis not present

## 2019-06-22 DIAGNOSIS — E039 Hypothyroidism, unspecified: Secondary | ICD-10-CM | POA: Diagnosis not present

## 2019-06-22 DIAGNOSIS — G9340 Encephalopathy, unspecified: Secondary | ICD-10-CM | POA: Diagnosis not present

## 2019-06-22 DIAGNOSIS — H3530 Unspecified macular degeneration: Secondary | ICD-10-CM | POA: Diagnosis not present

## 2019-06-22 DIAGNOSIS — M1990 Unspecified osteoarthritis, unspecified site: Secondary | ICD-10-CM | POA: Diagnosis not present

## 2019-06-22 DIAGNOSIS — Z794 Long term (current) use of insulin: Secondary | ICD-10-CM | POA: Diagnosis not present

## 2019-06-22 DIAGNOSIS — H409 Unspecified glaucoma: Secondary | ICD-10-CM | POA: Diagnosis not present

## 2019-06-22 DIAGNOSIS — N189 Chronic kidney disease, unspecified: Secondary | ICD-10-CM | POA: Diagnosis not present

## 2019-06-22 DIAGNOSIS — Z9011 Acquired absence of right breast and nipple: Secondary | ICD-10-CM | POA: Diagnosis not present

## 2019-06-22 DIAGNOSIS — E1142 Type 2 diabetes mellitus with diabetic polyneuropathy: Secondary | ICD-10-CM | POA: Diagnosis not present

## 2019-06-24 DIAGNOSIS — N189 Chronic kidney disease, unspecified: Secondary | ICD-10-CM | POA: Diagnosis not present

## 2019-06-24 DIAGNOSIS — I129 Hypertensive chronic kidney disease with stage 1 through stage 4 chronic kidney disease, or unspecified chronic kidney disease: Secondary | ICD-10-CM | POA: Diagnosis not present

## 2019-06-24 DIAGNOSIS — E039 Hypothyroidism, unspecified: Secondary | ICD-10-CM | POA: Diagnosis not present

## 2019-06-24 DIAGNOSIS — E1165 Type 2 diabetes mellitus with hyperglycemia: Secondary | ICD-10-CM | POA: Diagnosis not present

## 2019-06-24 DIAGNOSIS — Z794 Long term (current) use of insulin: Secondary | ICD-10-CM | POA: Diagnosis not present

## 2019-06-24 DIAGNOSIS — H409 Unspecified glaucoma: Secondary | ICD-10-CM | POA: Diagnosis not present

## 2019-06-24 DIAGNOSIS — I972 Postmastectomy lymphedema syndrome: Secondary | ICD-10-CM | POA: Diagnosis not present

## 2019-06-24 DIAGNOSIS — R1312 Dysphagia, oropharyngeal phase: Secondary | ICD-10-CM | POA: Diagnosis not present

## 2019-06-24 DIAGNOSIS — Z79899 Other long term (current) drug therapy: Secondary | ICD-10-CM | POA: Diagnosis not present

## 2019-06-24 DIAGNOSIS — E1142 Type 2 diabetes mellitus with diabetic polyneuropathy: Secondary | ICD-10-CM | POA: Diagnosis not present

## 2019-06-24 DIAGNOSIS — M1990 Unspecified osteoarthritis, unspecified site: Secondary | ICD-10-CM | POA: Diagnosis not present

## 2019-06-24 DIAGNOSIS — H3530 Unspecified macular degeneration: Secondary | ICD-10-CM | POA: Diagnosis not present

## 2019-06-24 DIAGNOSIS — Z9011 Acquired absence of right breast and nipple: Secondary | ICD-10-CM | POA: Diagnosis not present

## 2019-06-24 DIAGNOSIS — E1122 Type 2 diabetes mellitus with diabetic chronic kidney disease: Secondary | ICD-10-CM | POA: Diagnosis not present

## 2019-06-24 DIAGNOSIS — G9340 Encephalopathy, unspecified: Secondary | ICD-10-CM | POA: Diagnosis not present

## 2019-06-29 ENCOUNTER — Ambulatory Visit: Payer: Medicare Other | Admitting: *Deleted

## 2019-06-29 DIAGNOSIS — H3530 Unspecified macular degeneration: Secondary | ICD-10-CM | POA: Diagnosis not present

## 2019-06-29 DIAGNOSIS — R1312 Dysphagia, oropharyngeal phase: Secondary | ICD-10-CM | POA: Diagnosis not present

## 2019-06-29 DIAGNOSIS — E1122 Type 2 diabetes mellitus with diabetic chronic kidney disease: Secondary | ICD-10-CM | POA: Diagnosis not present

## 2019-06-29 DIAGNOSIS — Z794 Long term (current) use of insulin: Secondary | ICD-10-CM

## 2019-06-29 DIAGNOSIS — E039 Hypothyroidism, unspecified: Secondary | ICD-10-CM | POA: Diagnosis not present

## 2019-06-29 DIAGNOSIS — M1990 Unspecified osteoarthritis, unspecified site: Secondary | ICD-10-CM | POA: Diagnosis not present

## 2019-06-29 DIAGNOSIS — Z79899 Other long term (current) drug therapy: Secondary | ICD-10-CM | POA: Diagnosis not present

## 2019-06-29 DIAGNOSIS — G9340 Encephalopathy, unspecified: Secondary | ICD-10-CM | POA: Diagnosis not present

## 2019-06-29 DIAGNOSIS — H409 Unspecified glaucoma: Secondary | ICD-10-CM | POA: Diagnosis not present

## 2019-06-29 DIAGNOSIS — E1165 Type 2 diabetes mellitus with hyperglycemia: Secondary | ICD-10-CM | POA: Diagnosis not present

## 2019-06-29 DIAGNOSIS — N189 Chronic kidney disease, unspecified: Secondary | ICD-10-CM | POA: Diagnosis not present

## 2019-06-29 DIAGNOSIS — E1142 Type 2 diabetes mellitus with diabetic polyneuropathy: Secondary | ICD-10-CM | POA: Diagnosis not present

## 2019-06-29 DIAGNOSIS — I972 Postmastectomy lymphedema syndrome: Secondary | ICD-10-CM | POA: Diagnosis not present

## 2019-06-29 DIAGNOSIS — Z9011 Acquired absence of right breast and nipple: Secondary | ICD-10-CM | POA: Diagnosis not present

## 2019-06-29 DIAGNOSIS — I129 Hypertensive chronic kidney disease with stage 1 through stage 4 chronic kidney disease, or unspecified chronic kidney disease: Secondary | ICD-10-CM | POA: Diagnosis not present

## 2019-06-29 DIAGNOSIS — I10 Essential (primary) hypertension: Secondary | ICD-10-CM

## 2019-06-29 DIAGNOSIS — Z9181 History of falling: Secondary | ICD-10-CM | POA: Diagnosis not present

## 2019-06-29 NOTE — Patient Instructions (Addendum)
Visit Information It was nice speaking with you today. I will plan to introduce myself to you tomorrow during your clinic appointment. Be sure and bring your glucometer and your Dexcom sensor and supplies with you to your appointment.  Goals Addressed            This Visit's Progress     Patient Stated   . " I don't know why my blood sugar was so high when they took me to the hospital in March. I think I was taking my medications like I'm suppose to." (pt-stated)       CARE PLAN ENTRY (see longitudinal plan of care for additional care plan information)  Current Barriers:  . Chronic Disease Management support, education, and care coordination needs related to HTN, HLD, DMII, CKD Stage 3, and Depression- Clinical Goal(s) related to  HTN, HLD, DMII, CKD Stage 3, and Depression- spoke with patient,  she reports all blood sugar <120, she denies hypoglycemia or symptoms of hypoglycemia, she says she is taking her medications as prescribed. She reports she is still receiving home health physical therapy but doesn't feel like she is getting any stronger. Her appetite remains poor and she says she is losing weight;  says she has developed an aversion to meat. Reports she is drinking one Glucerna supplement daily but is currently out. Says she has not made follow up appointments with Dr Jeffie Pollock or with [facility] counselor. She says she is still living in [facility] with her son [name] and does not have a way to get to her clinic appointment on 6/2 at 2:15 pm. With patient's permission, called [name] and told him about the clinic appointment, he says he will make sure patient gets to the appointment, reminded him to be sure his Mom brings her glucometer and her Dexcom sensor supplies as she also has a meeting with Debera Lat RD after her clinic appointment to place the sensor  .  Over the next 30 days, patient will:  . Work with the care management team to address educational,  disease management, and care coordination needs  . Begin or continue self health monitoring activities as directed today Measure and record CBG (blood glucose) 2 times daily and take medications as prescribed . Call provider office for new or worsened signs and symptoms HTN, HLD, DMII, CKD Stage 3, and Depression . Call care management team with questions or concerns . Verbalize basic understanding of patient centered plan of care established today  Interventions related to HTN, HLD, DMII, CKD Stage 3, and Depression . Assessed patient's DM self management strategies and reviewed blood glucose home readings-provided positive reinforcement for the blood sugar readings of <120 . Reviewed clinic appointment time and date of 06/30/19 at 2:15 pm - called patient's son to ensure she has transportation . Reminded patient's son [name] to be sure patient brings glucometer and her Dexcom sensor supplies to her appointment on 06/30/19 . Met with patient and son after clinic appointment- discussed ways to incorporate non meat protein in diet , provided Glucerna coupons . Reinforced importance of making follow up appointments with urology and clinic counselor with patient and her son  Patient Self Care Activities related to HTN, HLD, DMII, CKD Stage 3, and Depression Patient is unable to independently self-manage chronic health conditions  Please see past updates related to this goal by clicking on the "Past Updates" button in the selected goal      . "I could use some help  around the house when I get back home" (pt-stated)       Current Barriers:  [name] Knowledge Barriers related to resources available for ADL IADL limitations  Case Manager Clinical Goal(s):  [name] Over the next 60 days, patient will work with BSW to address needs related to ADL IADL limitations; obtaining [place]  . Over the next 60 days, BSW will collaborate with RN Care Manager to address care management and care coordination  needs  Interventions:  . 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  [name] Patient Self Care Activities:  . Patient verbalizes understanding of plan to work with BSW to obtain [employer]  . Self administers medications as prescribed . Attends all scheduled provider appointments  Please see past updates related to this goal by clicking on the "Past Updates" button in the selected goal          The patient verbalized understanding of instructions provided today and declined a print copy of patient instruction materials.   The care management team will reach out to the patient again over the next 1 day.   Kelli Churn RN, CCM, [facility] RN Care Manager [phone]

## 2019-06-29 NOTE — Chronic Care Management (AMB) (Addendum)
Chronic Care Management   Follow Up Note   06/29/2019 Name: [name] MRN: [id number] DOB: 08-20-1950  Referred by: Aldine Contes, MD Reason for referral : Chronic Care Management (DM ,HTN, CKD)   [name] is a 69 y.o. year old female who is a primary care patient of Aldine Contes, MD. The CCM team was consulted for assistance with chronic disease management and care coordination needs.    Review of patient status, including review of consultants reports, relevant laboratory and other test results, and collaboration with appropriate care team members and the patient's provider was performed as part of comprehensive patient evaluation and provision of chronic care management services.    SDOH (Social Determinants of Health) assessments performed: No See Care Plan activities for detailed interventions related to [facility])     Outpatient Encounter Medications as of 06/29/2019  Medication Sig Note  . Accu-Chek Softclix Lancets lancets Check blood sugar 5 times a day as instructed   . Acetaminophen 325 MG CAPS Take 325 mg by mouth every 6 (six) hours as needed (for pain).   . ALPHAGAN P 0.1 % SOLN Place 1 drop into both eyes 3 (three) times daily. 04/17/2019: No record of this in past 6 months per external pharmacy records  . Besifloxacin HCl (BESIVANCE) 0.6 % SUSP Besivance 0.6 % eye drops,suspension  INSTILL 1 DROP INTO THE LEFT EYE QID X 2 DAYS AFTER EACH MONTHLY EYE INJECTION 04/17/2019: No record of this in past 6 months per external pharmacy records  . Blood Glucose Monitoring Suppl (ACCU-CHEK GUIDE ME) w/Device KIT Check blood sugar 5 times a day   . busPIRone (BUSPAR) 5 MG tablet Take 1 tablet (5 mg total) by mouth 2 (two) times daily.   . calcium citrate-vitamin D (CITRACAL+D) 315-200 MG-UNIT tablet Take 1 tablet by mouth 2 (two) times daily. (Patient not taking: Reported on 11/20/2017)   . Cholecalciferol (VITAMIN D3) 2000 units TABS Take 2,000 Units at bedtime by mouth.      . ciprofloxacin (CILOXAN) 0.3 % ophthalmic ointment  04/17/2019: No record of this in past 6 months per external pharmacy records  . cycloSPORINE (RESTASIS) 0.05 % ophthalmic emulsion Restasis MultiDose 0.05 % eye drops  INT 1 GTT IN [facility] EYE BID 04/17/2019: No record of this in past 6 months per external pharmacy records  . erythromycin ophthalmic ointment APPLY A SMALL AMOUNT INTO LEFT EYE TID 04/17/2019: No record of this in past 6 months per external pharmacy records  . glucose 4 GM chewable tablet Chew 4 tablets (16 g total) by mouth as needed for low blood sugar. 04/17/2019: No record of this in past 6 months per external pharmacy records  . glucose blood (ACCU-CHEK GUIDE) test strip Check blood sugar 5 times per day   . Insulin Pen Needle (BD PEN NEEDLE NANO U/F) 32G X 4 MM MISC USE AS DIRECTED   . ketorolac (ACULAR) 0.5 % ophthalmic solution Place 1 drop into both eyes 3 (three) times daily. 04/17/2019: No record of this in past 6 months per external pharmacy records  . levothyroxine (SYNTHROID) 88 MCG tablet Take 1 tablet (88 mcg total) by mouth daily before breakfast.   . loperamide (IMODIUM) 2 MG capsule Take 1 capsule (2 mg total) by mouth as needed for diarrhea or loose stools.   [name] losartan (COZAAR) 25 MG tablet Take 12.5 mg by mouth daily. 05/26/2019: Her son [name] says he thinks this medication was started while patient was in [facility]- she has been out  of it for a week so needs refill if she is to continue it  . metFORMIN (GLUCOPHAGE) 1000 MG tablet Take 1 tablet (1,000 mg total) by mouth 2 (two) times daily with a meal.   . Nutritional Supplements (GLUCERNA SNACK SHAKE) LIQD Take 1 Dose by mouth 3 (three) times daily as needed. (Patient not taking: Reported on 05/26/2019) 05/26/2019: She is drinking vanilla ensure instead of Glucerna  . Olopatadine HCl (PATADAY) 0.2 % SOLN Place 1 drop into both eyes daily as needed (dry eyes).  04/17/2019: No record of this in past 6 months per external  pharmacy records  . Polyethyl Glycol-Propyl Glycol (SYSTANE) 0.4-0.3 % GEL ophthalmic gel Place 1 application daily as needed into both eyes (dry eyes/irritation).    . potassium chloride SA (KLOR-CON) 20 MEQ tablet Take 1 tablet (20 mEq total) by mouth daily for 3 days.   . prednisoLONE acetate (PRED FORTE) 1 % ophthalmic suspension  04/17/2019: No record of this in past 6 months per external pharmacy records  . rosuvastatin (CRESTOR) 20 MG tablet Take 1 tablet (20 mg total) by mouth daily.   . Semaglutide,0.25 or 0.5MG/DOS, (OZEMPIC, 0.25 OR 0.5 MG/DOSE,) 2 MG/1.5ML SOPN Inject 0.5 mg into the skin once a week.   . valACYclovir (VALTREX) 500 MG tablet TK 1 T PO QD 05/26/2019: She is out and needs refill if she is to continue this medication  . venlafaxine XR (EFFEXOR-XR) 150 MG 24 hr capsule TAKE 1 CAPSULE BY MOUTH DAILY WITH BREAKFAST    No facility-administered encounter medications on file as of 06/29/2019.     Objective:  Lab Results  Component Value Date   HGBA1C 14.4 (H) 04/18/2019   HGBA1C 9.0 (A) 12/08/2018   HGBA1C 7.7 (A) 08/25/2018   Lab Results  Component Value Date   MICROALBUR 1.0 12/09/2013   LDLCALC 154 (H) 10/13/2018   CREATININE 1.09 (H) 06/16/2019  please get yearly urine for protein BP Readings from Last 3 Encounters:  06/17/19 (!) 176/85  06/01/19 118/69  05/26/19 135/71   Wt Readings from Last 3 Encounters:  06/01/19 122 lb 4.8 oz (55.5 kg)  05/26/19 124 lb 11.2 oz (56.6 kg)  04/18/19 121 lb 11.1 oz (55.2 kg)   Lab Results  Component Value Date   CHOL 228 (H) 10/13/2018   HDL 55 10/13/2018   LDLCALC 154 (H) 10/13/2018   TRIG 108 10/13/2018   CHOLHDL 4.1 10/13/2018   Goals Addressed            This Visit's Progress     Patient Stated   . " I don't know why my blood sugar was so high when they took me to the hospital in March. I think I was taking my medications like I'm suppose to." (pt-stated)       CARE PLAN ENTRY (see longitudinal plan of  care for additional care plan information)  Current Barriers:  . Chronic Disease Management support, education, and care coordination needs related to HTN, HLD, DMII, CKD Stage 3, and Depression- Clinical Goal(s) related to  HTN, HLD, DMII, CKD Stage 3, and Depression- spoke with patient,  she reports all blood sugar <120, she denies hypoglycemia or symptoms of hypoglycemia, she says she is taking her medications as prescribed. She reports she is still receiving home health physical therapy but doesn't feel like she is getting any stronger. Her appetite remains poor and she says she is losing weight;  says she has developed an aversion to meat. Reports she is  drinking one Glucerna supplement daily but is currently out. Says she has not made follow up appointments with Dr Jeffie Pollock or with [facility] behavioral health counselor. She says she is still living in [facility] with her son [name] and does not have a way to get to her clinic appointment on 6/2 at 2:15 pm. With patient's permission, called [name] and told him about the clinic appointment, he says he will make sure patient gets to the appointment, reminded him to be sure his Mom brings her glucometer and her Dexcom sensor supplies as she also has a meeting with Debera Lat RD after her clinic appointment to place the sensor  .  Over the next 30 days, patient will:  . Work with the care management team to address educational, disease management, and care coordination needs  . Begin or continue self health monitoring activities as directed today Measure and record CBG (blood glucose) 2 times daily and take medications as prescribed . Call provider office for new or worsened signs and symptoms HTN, HLD, DMII, CKD Stage 3, and Depression . Call care management team with questions or concerns . Verbalize basic understanding of patient centered plan of care established today  Interventions related to HTN, HLD, DMII, CKD Stage 3, and  Depression . Assessed patient's DM self management strategies and reviewed blood glucose home readings-provided positive reinforcement for the blood sugar readings of <120 . Reviewed clinic appointment time and date of 06/30/19 at 2:15 pm - called patient's son to ensure she has transportation . Reminded patient's son [name] to be sure patient brings glucometer and her Dexcom sensor supplies to her appointment on 06/30/19 . Met with patient and son after clinic appointment- discussed ways to incorporate non meat protein in diet , provided Glucerna coupons . Reinforced importance of making follow up appointments with urology and clinic counselor with patient and her son  Patient Self Care Activities related to HTN, HLD, DMII, CKD Stage 3, and Depression Patient is unable to independently self-manage chronic health conditions  Please see past updates related to this goal by clicking on the "Past Updates" button in the selected goal      . "I could use some help around the house when I get back home" (pt-stated)       Current Barriers:  [name] Knowledge Barriers related to resources available for ADL IADL limitations  Case Manager Clinical Goal(s):  [name] Over the next 60 days, patient will work with BSW to address needs related to ADL IADL limitations; obtaining [place]  . Over the next 60 days, BSW will collaborate with RN Care Manager to address care management and care coordination needs  Interventions:  . 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  [name] Patient Self Care Activities:  . Patient verbalizes understanding of plan to work with BSW to obtain [employer]  . Self administers medications as prescribed . Attends all scheduled provider appointments  Please see past updates related to this goal by clicking on the "Past Updates" button in the selected goal         Plan:   The care management team will reach out to the patient again over the next 1 day.   Kelli Churn RN, CCM, [facility] RN Care Manager [phone]

## 2019-06-30 ENCOUNTER — Ambulatory Visit (INDEPENDENT_AMBULATORY_CARE_PROVIDER_SITE_OTHER): Payer: Medicare Other | Admitting: Internal Medicine

## 2019-06-30 ENCOUNTER — Encounter: Payer: Self-pay | Admitting: Dietician

## 2019-06-30 ENCOUNTER — Ambulatory Visit: Payer: Medicare Other | Admitting: Dietician

## 2019-06-30 VITALS — BP 135/74 | HR 101 | Temp 98.6°F | Ht 62.0 in | Wt 113.6 lb

## 2019-06-30 DIAGNOSIS — I10 Essential (primary) hypertension: Secondary | ICD-10-CM | POA: Diagnosis not present

## 2019-06-30 DIAGNOSIS — Z9181 History of falling: Secondary | ICD-10-CM | POA: Diagnosis not present

## 2019-06-30 DIAGNOSIS — F32A Depression, unspecified: Secondary | ICD-10-CM

## 2019-06-30 DIAGNOSIS — E039 Hypothyroidism, unspecified: Secondary | ICD-10-CM | POA: Diagnosis not present

## 2019-06-30 DIAGNOSIS — N183 Chronic kidney disease, stage 3 unspecified: Secondary | ICD-10-CM | POA: Diagnosis not present

## 2019-06-30 DIAGNOSIS — I972 Postmastectomy lymphedema syndrome: Secondary | ICD-10-CM | POA: Diagnosis not present

## 2019-06-30 DIAGNOSIS — Z7984 Long term (current) use of oral hypoglycemic drugs: Secondary | ICD-10-CM

## 2019-06-30 DIAGNOSIS — G9340 Encephalopathy, unspecified: Secondary | ICD-10-CM | POA: Diagnosis not present

## 2019-06-30 DIAGNOSIS — H3530 Unspecified macular degeneration: Secondary | ICD-10-CM | POA: Diagnosis not present

## 2019-06-30 DIAGNOSIS — E1122 Type 2 diabetes mellitus with diabetic chronic kidney disease: Secondary | ICD-10-CM | POA: Diagnosis not present

## 2019-06-30 DIAGNOSIS — Z9011 Acquired absence of right breast and nipple: Secondary | ICD-10-CM | POA: Diagnosis not present

## 2019-06-30 DIAGNOSIS — H409 Unspecified glaucoma: Secondary | ICD-10-CM | POA: Diagnosis not present

## 2019-06-30 DIAGNOSIS — Z79899 Other long term (current) drug therapy: Secondary | ICD-10-CM | POA: Diagnosis not present

## 2019-06-30 DIAGNOSIS — F329 Major depressive disorder, single episode, unspecified: Secondary | ICD-10-CM

## 2019-06-30 DIAGNOSIS — I129 Hypertensive chronic kidney disease with stage 1 through stage 4 chronic kidney disease, or unspecified chronic kidney disease: Secondary | ICD-10-CM | POA: Diagnosis not present

## 2019-06-30 DIAGNOSIS — N189 Chronic kidney disease, unspecified: Secondary | ICD-10-CM | POA: Diagnosis not present

## 2019-06-30 DIAGNOSIS — M1990 Unspecified osteoarthritis, unspecified site: Secondary | ICD-10-CM | POA: Diagnosis not present

## 2019-06-30 DIAGNOSIS — Z794 Long term (current) use of insulin: Secondary | ICD-10-CM

## 2019-06-30 DIAGNOSIS — E1165 Type 2 diabetes mellitus with hyperglycemia: Secondary | ICD-10-CM | POA: Diagnosis not present

## 2019-06-30 DIAGNOSIS — R1312 Dysphagia, oropharyngeal phase: Secondary | ICD-10-CM | POA: Diagnosis not present

## 2019-06-30 DIAGNOSIS — E1142 Type 2 diabetes mellitus with diabetic polyneuropathy: Secondary | ICD-10-CM | POA: Diagnosis not present

## 2019-06-30 MED ORDER — OZEMPIC (0.25 OR 0.5 MG/DOSE) 2 MG/1.5ML ~~LOC~~ SOPN: 0.2500 mg | PEN_INJECTOR | SUBCUTANEOUS | 0 refills | Status: DC

## 2019-06-30 MED ORDER — METFORMIN HCL 500 MG PO TABS: 1000.0000 mg | ORAL_TABLET | Freq: Two times a day (BID) | ORAL | 5 refills | Status: DC

## 2019-06-30 NOTE — Assessment & Plan Note (Signed)
Patient is currently on 88 mcg daily at home, denies any specific complaints at this time. Last TSH was low and her medications were adjusted on her last visit.    -Continue synthroid 88 mcg daily -TSH

## 2019-06-30 NOTE — Assessment & Plan Note (Signed)
Patient has a history of depression and is currently on Effexor and BuSpar.  She reports that these medications are doing well. PHQ-9 is 3 today.  Her son was with her and they reported that the anniversary of her daughter's death just passed.  Discussed referring to Ms. Dessie Coma, she reports she has seen her in the past and would be okay with referral.  -Continue Effexor and BuSpar -Referral to [facility]

## 2019-06-30 NOTE — Assessment & Plan Note (Signed)
BP well controlled, not on any medications. No further changes.

## 2019-06-30 NOTE — Assessment & Plan Note (Signed)
Patient and son report that she has had a decrease in her appetite and weight loss. Ozempic and metformin has been helping keep her sugars well controlled. She is on metformin 1000 mg daily, has been on this dose for awhile.  CBGs have been around 110-135, never much lower or higher. Still endorses some polyuria and polydipsia. Last A1c was 14. Overall is uncontrolled however given her weight loss and GI side effects will adjust her medications to try and limit this. Could consider an SGLT-2 inhibitor once her A1c has improved a little bit.   -Decrease Ozempic to 0.25 mg weekly -Transition to metformin XR to try and decrease side effects.  -Continue monitoring blood sugars -Repeat A1c on next visit in 2-3 weeks

## 2019-06-30 NOTE — Patient Instructions (Addendum)
Ms. Lanny Nayar,  It was a pleasure to see you today. Thank you for coming in.   Today we discussed your diabetes and decreased appetite. In regards to this please decrease your Ozempic to 0.25 mg weekly and I have changed the type of metformin that you are getting. Please continue checking your blood sugars and bring in your meter on your next visit.    We also discussed your thyroid disorder. I got some labs and will contact you with the results.   Please return to clinic in 2-3 weeks or sooner if needed.   Thank you again for coming in.   Asencion Noble.D.

## 2019-06-30 NOTE — Progress Notes (Signed)
Ms. Schlachter and her son came in today for assistance in reordering her CGM supplies. Call placed to [facility] supply who said they had an order ready but no one was responding to their calls. They need a call to confirm the order. Her son, Shon Millet name and number were entered as her main number. I confirmed the shipping address with [name] and [place] today. A 90 day supply will go out soon and will take ~ 1 week to arrive. Next order in September.

## 2019-06-30 NOTE — Progress Notes (Signed)
   CC: Diabetes, hypothyroidism, decreased appetite  HPI:  [name] is a 69 y.o. with the history listed below presenting for her diabetes, hypothyroidism, and decreased appetite.  Past Medical History:  Diagnosis Date  . Arthritis   . Bilateral hip pain 02/24/2015  . Borderline glaucoma of both eyes   . Depression   . Diabetic polyneuropathy ([place])   . Diverticulosis of colon   . Dizziness 12/05/2017  . Dry eyes, bilateral   . Feeling of incomplete bladder emptying   . Frequent falls 01/08/2018  . History of breast cancer oncologist-  dr Waymon Budge-- per lov note no recurrence   dx 04/ 1999 --- Stage 3B-- s/p  chemotherapy then right mastectomy then concurrent chemoradiation therapy  . History of urinary retention    01/ 2018  . Hydronephrosis of right kidney   . Hyperlipidemia   . Hypertension   . Hypokalemia 12/09/2016  . Hypomagnesemia 12/09/2016  . Hypothyroidism   . Insulin dependent type 2 diabetes mellitus [facility])    endocrinologist-  dr Cruzita Lederer---  last A1c 8.7 on 02-20-2016  . Lymphedema of upper extremity    right  . Macular degeneration, left eye    followed by dr Zigmund Daniel  . Renal insufficiency   . Seizure-like activity ([place]) 04/17/2019  . Urgency of urination    Review of Systems:  Reports decreased appetite, weight loss. Denies chest pain, shortness of breath, headaches, lightheadedness, dizziness, fatigue, weakness, abdominal pain, diarrhea, constipation, or other symptoms.  Physical Exam:  Vitals:   06/30/19 1434  BP: 135/74  Pulse: (!) 101  Temp: 98.6 F (37 C)  TempSrc: Oral  SpO2: 100%  Weight: 113 lb 9.6 oz (51.5 kg)  Height: 5\' 2"  (1.575 m)   Physical Exam  Constitutional: She is oriented to person, place, and time and well-developed, well-nourished, and in no distress.  HENT:  Head: Normocephalic and atraumatic.  Eyes: Pupils are equal, round, and reactive to light. EOM are normal.  Neck: No thyromegaly present.  Cardiovascular:  Normal rate, regular rhythm and normal heart sounds.  Pulmonary/Chest: Effort normal and breath sounds normal. No respiratory distress.  Abdominal: Soft. Bowel sounds are normal. She exhibits no distension. There is no abdominal tenderness.  Musculoskeletal:        General: No edema. Normal range of motion.     Cervical back: Normal range of motion and neck supple.  Neurological: She is alert and oriented to person, place, and time.  Skin: Skin is warm and dry.  Psychiatric: Mood and affect normal.    Assessment & Plan:   See Encounters Tab for problem based charting.  Patient discussed with Dr. Heber Vermillion

## 2019-07-01 ENCOUNTER — Telehealth: Payer: Medicare Other

## 2019-07-01 ENCOUNTER — Ambulatory Visit: Payer: Self-pay

## 2019-07-01 DIAGNOSIS — I972 Postmastectomy lymphedema syndrome: Secondary | ICD-10-CM | POA: Diagnosis not present

## 2019-07-01 DIAGNOSIS — E1122 Type 2 diabetes mellitus with diabetic chronic kidney disease: Secondary | ICD-10-CM | POA: Diagnosis not present

## 2019-07-01 DIAGNOSIS — E1165 Type 2 diabetes mellitus with hyperglycemia: Secondary | ICD-10-CM | POA: Diagnosis not present

## 2019-07-01 DIAGNOSIS — E039 Hypothyroidism, unspecified: Secondary | ICD-10-CM | POA: Diagnosis not present

## 2019-07-01 DIAGNOSIS — N189 Chronic kidney disease, unspecified: Secondary | ICD-10-CM | POA: Diagnosis not present

## 2019-07-01 DIAGNOSIS — E1142 Type 2 diabetes mellitus with diabetic polyneuropathy: Secondary | ICD-10-CM | POA: Diagnosis not present

## 2019-07-01 DIAGNOSIS — H3530 Unspecified macular degeneration: Secondary | ICD-10-CM | POA: Diagnosis not present

## 2019-07-01 DIAGNOSIS — Z9011 Acquired absence of right breast and nipple: Secondary | ICD-10-CM | POA: Diagnosis not present

## 2019-07-01 DIAGNOSIS — H409 Unspecified glaucoma: Secondary | ICD-10-CM | POA: Diagnosis not present

## 2019-07-01 DIAGNOSIS — R1312 Dysphagia, oropharyngeal phase: Secondary | ICD-10-CM | POA: Diagnosis not present

## 2019-07-01 DIAGNOSIS — G9340 Encephalopathy, unspecified: Secondary | ICD-10-CM | POA: Diagnosis not present

## 2019-07-01 DIAGNOSIS — I129 Hypertensive chronic kidney disease with stage 1 through stage 4 chronic kidney disease, or unspecified chronic kidney disease: Secondary | ICD-10-CM | POA: Diagnosis not present

## 2019-07-01 DIAGNOSIS — M1990 Unspecified osteoarthritis, unspecified site: Secondary | ICD-10-CM | POA: Diagnosis not present

## 2019-07-01 DIAGNOSIS — Z794 Long term (current) use of insulin: Secondary | ICD-10-CM | POA: Diagnosis not present

## 2019-07-01 DIAGNOSIS — Z9181 History of falling: Secondary | ICD-10-CM | POA: Diagnosis not present

## 2019-07-01 DIAGNOSIS — Z79899 Other long term (current) drug therapy: Secondary | ICD-10-CM | POA: Diagnosis not present

## 2019-07-01 LAB — BMP8+ANION GAP
Anion Gap: 16 mmol/L (ref 10.0–18.0)
BUN/Creatinine Ratio: 17 (ref 12–28)
BUN: 15 mg/dL (ref 8–27)
CO2: 24 mmol/L (ref 20–29)
Calcium: 9.9 mg/dL (ref 8.7–10.3)
Chloride: 101 mmol/L (ref 96–106)
Creatinine, Ser: 0.89 mg/dL (ref 0.57–1.00)
GFR calc Af Amer: 77 mL/min/{1.73_m2} (ref >59)
GFR calc non Af Amer: 67 mL/min/{1.73_m2} (ref >59)
Glucose: 221 mg/dL — ABNORMAL HIGH (ref 65–99)
Potassium: 4.3 mmol/L (ref 3.5–5.2)
Sodium: 141 mmol/L (ref 134–144)

## 2019-07-01 LAB — TSH: TSH: 0.044 u[IU]/mL — ABNORMAL LOW (ref 0.450–4.500)

## 2019-07-01 NOTE — Progress Notes (Signed)
Internal Medicine Clinic Resident  I have personally reviewed this encounter including the documentation in this note and/or discussed this patient with the care management provider. I will address any urgent items identified by the care management provider and will communicate my actions to the patient's PCP. I have reviewed the patient's CCM visit with my supervising attending, Dr Butcher.  Talley Casco, MD 07/01/2019    

## 2019-07-01 NOTE — Chronic Care Management (AMB) (Signed)
  Chronic Care Management   Outreach Note  07/01/2019 Name: Silvia Yong MRN: [id number] DOB: November 23, 1950  Referred by: Aldine Contes, MD Reason for referral : Care Coordination [facility])   An unsuccessful telephone outreach was attempted today. The patient was referred to the case management team for assistance with care management and care coordination.   Follow Up Plan: A HIPPA compliant phone message was left for the patient providing contact information and requesting a return call.      Ronn Melena, [place] Coordination Social Worker [place] [phone]

## 2019-07-02 ENCOUNTER — Telehealth: Payer: Self-pay | Admitting: Internal Medicine

## 2019-07-02 ENCOUNTER — Ambulatory Visit: Payer: Medicare Other

## 2019-07-02 DIAGNOSIS — E1122 Type 2 diabetes mellitus with diabetic chronic kidney disease: Secondary | ICD-10-CM | POA: Diagnosis not present

## 2019-07-02 DIAGNOSIS — H409 Unspecified glaucoma: Secondary | ICD-10-CM | POA: Diagnosis not present

## 2019-07-02 DIAGNOSIS — E1142 Type 2 diabetes mellitus with diabetic polyneuropathy: Secondary | ICD-10-CM | POA: Diagnosis not present

## 2019-07-02 DIAGNOSIS — Z794 Long term (current) use of insulin: Secondary | ICD-10-CM | POA: Diagnosis not present

## 2019-07-02 DIAGNOSIS — Z79899 Other long term (current) drug therapy: Secondary | ICD-10-CM | POA: Diagnosis not present

## 2019-07-02 DIAGNOSIS — N183 Chronic kidney disease, stage 3 unspecified: Secondary | ICD-10-CM

## 2019-07-02 DIAGNOSIS — E039 Hypothyroidism, unspecified: Secondary | ICD-10-CM

## 2019-07-02 DIAGNOSIS — I972 Postmastectomy lymphedema syndrome: Secondary | ICD-10-CM | POA: Diagnosis not present

## 2019-07-02 DIAGNOSIS — M1990 Unspecified osteoarthritis, unspecified site: Secondary | ICD-10-CM | POA: Diagnosis not present

## 2019-07-02 DIAGNOSIS — R1312 Dysphagia, oropharyngeal phase: Secondary | ICD-10-CM | POA: Diagnosis not present

## 2019-07-02 DIAGNOSIS — E1165 Type 2 diabetes mellitus with hyperglycemia: Secondary | ICD-10-CM | POA: Diagnosis not present

## 2019-07-02 DIAGNOSIS — N189 Chronic kidney disease, unspecified: Secondary | ICD-10-CM | POA: Diagnosis not present

## 2019-07-02 DIAGNOSIS — Z9011 Acquired absence of right breast and nipple: Secondary | ICD-10-CM | POA: Diagnosis not present

## 2019-07-02 DIAGNOSIS — Z9181 History of falling: Secondary | ICD-10-CM | POA: Diagnosis not present

## 2019-07-02 DIAGNOSIS — I10 Essential (primary) hypertension: Secondary | ICD-10-CM

## 2019-07-02 DIAGNOSIS — G9340 Encephalopathy, unspecified: Secondary | ICD-10-CM | POA: Diagnosis not present

## 2019-07-02 DIAGNOSIS — H3530 Unspecified macular degeneration: Secondary | ICD-10-CM | POA: Diagnosis not present

## 2019-07-02 DIAGNOSIS — I129 Hypertensive chronic kidney disease with stage 1 through stage 4 chronic kidney disease, or unspecified chronic kidney disease: Secondary | ICD-10-CM | POA: Diagnosis not present

## 2019-07-02 MED ORDER — LEVOTHYROXINE SODIUM 75 MCG PO TABS: 75.0000 ug | ORAL_TABLET | Freq: Every day | ORAL | 2 refills | Status: DC

## 2019-07-02 NOTE — Chronic Care Management (AMB) (Signed)
  Care Management   Follow Up Note   07/02/2019 Name: [name] MRN: [id number] DOB: 1950-09-19  Referred by: Aldine Contes, MD Reason for referral : Care Coordination (PCS)   [name] is a 69 y.o. year old female who is a primary care patient of Aldine Contes, MD. The care management team was consulted for assistance with care management and care coordination needs.    Review of patient status, including review of consultants reports, relevant laboratory and other test results, and collaboration with appropriate care team members and the patient's provider was performed as part of comprehensive patient evaluation and provision of chronic care management services.    SDOH (Social Determinants of Health) assessments performed: No See Care Plan activities for detailed interventions related to [facility])     Advanced Directives: See Care Plan and Vynca application for related entries.   Goals Addressed            This Visit's Progress   . "I could use some help around the house when I get back home" (pt-stated)       Current Barriers:  [name] Knowledge Barriers related to resources available for ADL IADL limitations  Case Manager Clinical Goal(s):  [name] Over the next 60 days, patient will work with BSW to address needs related to ADL IADL limitations; obtaining [place]  . Over the next 60 days, BSW will collaborate with RN Care Manager to address care management and care coordination needs  Interventions:  . Contacted patient regarding status of PCS.  Patient has not received letter from [facility].   . Informed son that letter was most likely sent to patient's home in [place].  Son stated he would have brother check mail at patient' s home.  . Sent son link to [employer] provider list via secure e mail . Encouraged son to assist patient with choosing three agencies in case first choice is not able to accommodate.   . Patient Self Care  Activities:  . Patient verbalizes understanding of plan to work with BSW to obtain [employer]  . Self administers medications as prescribed . Attends all scheduled provider appointments  Please see past updates related to this goal by clicking on the "Past Updates" button in the selected goal          The care management team will reach out to the patient again over the next 14 days.       Ronn Melena, [place] Coordination Social Worker [place] [phone]

## 2019-07-02 NOTE — Patient Instructions (Signed)
Visit Information  Goals Addressed            This Visit's Progress    "I could use some help around the house when I get back home" (pt-stated)       Current Barriers:   Knowledge Barriers related to resources available for ADL IADL limitations  Case Manager Clinical Goal(s):   Over the next 60 days, patient will work with BSW to address needs related to ADL IADL limitations; obtaining [place]   Over the next 60 days, BSW will collaborate with RN Care Manager to address care management and care coordination needs  Interventions:   Contacted patient regarding status of PCS.  Patient has not received letter from [facility].    Informed son that letter was most likely sent to patient's home in [place].  Son stated he would have brother check mail at patient' s home.   Sent son link to [employer] provider list via secure e mail  Encouraged son to assist patient with choosing three agencies in case first choice is not able to accommodate.    Patient Self Care Activities:   Patient verbalizes understanding of plan to work with BSW to obtain [place]   Self administers medications as prescribed  Attends all scheduled provider appointments  Please see past updates related to this goal by clicking on the "Past Updates" button in the selected goal         Patient verbalizes understanding of instructions provided today.   The care management team will reach out to the patient again over the next 14 days.     Ronn Melena, [place] Coordination Social Worker [place] [phone]

## 2019-07-02 NOTE — Telephone Encounter (Signed)
Contacted patient regarding results of low TSH.  She reports that she was taking the 88 mcg of levothyroxine daily.  Discussed that this likely is still too high based on her results.  Advised to decrease to 75 mcg levothyroxine daily, sent in new prescription, only a 30-day course with 2 refills.  Advised to schedule follow-up in 4 to 6 weeks to repeat TSH and adjust dose as needed.

## 2019-07-05 DIAGNOSIS — H409 Unspecified glaucoma: Secondary | ICD-10-CM | POA: Diagnosis not present

## 2019-07-05 DIAGNOSIS — N189 Chronic kidney disease, unspecified: Secondary | ICD-10-CM | POA: Diagnosis not present

## 2019-07-05 DIAGNOSIS — I972 Postmastectomy lymphedema syndrome: Secondary | ICD-10-CM | POA: Diagnosis not present

## 2019-07-05 DIAGNOSIS — Z794 Long term (current) use of insulin: Secondary | ICD-10-CM | POA: Diagnosis not present

## 2019-07-05 DIAGNOSIS — Z9011 Acquired absence of right breast and nipple: Secondary | ICD-10-CM | POA: Diagnosis not present

## 2019-07-05 DIAGNOSIS — M1990 Unspecified osteoarthritis, unspecified site: Secondary | ICD-10-CM | POA: Diagnosis not present

## 2019-07-05 DIAGNOSIS — Z9181 History of falling: Secondary | ICD-10-CM | POA: Diagnosis not present

## 2019-07-05 DIAGNOSIS — G9340 Encephalopathy, unspecified: Secondary | ICD-10-CM | POA: Diagnosis not present

## 2019-07-05 DIAGNOSIS — E039 Hypothyroidism, unspecified: Secondary | ICD-10-CM | POA: Diagnosis not present

## 2019-07-05 DIAGNOSIS — Z79899 Other long term (current) drug therapy: Secondary | ICD-10-CM | POA: Diagnosis not present

## 2019-07-05 DIAGNOSIS — I129 Hypertensive chronic kidney disease with stage 1 through stage 4 chronic kidney disease, or unspecified chronic kidney disease: Secondary | ICD-10-CM | POA: Diagnosis not present

## 2019-07-05 DIAGNOSIS — E1142 Type 2 diabetes mellitus with diabetic polyneuropathy: Secondary | ICD-10-CM | POA: Diagnosis not present

## 2019-07-05 DIAGNOSIS — H3530 Unspecified macular degeneration: Secondary | ICD-10-CM | POA: Diagnosis not present

## 2019-07-05 DIAGNOSIS — E1165 Type 2 diabetes mellitus with hyperglycemia: Secondary | ICD-10-CM | POA: Diagnosis not present

## 2019-07-05 DIAGNOSIS — R1312 Dysphagia, oropharyngeal phase: Secondary | ICD-10-CM | POA: Diagnosis not present

## 2019-07-05 DIAGNOSIS — E1122 Type 2 diabetes mellitus with diabetic chronic kidney disease: Secondary | ICD-10-CM | POA: Diagnosis not present

## 2019-07-05 NOTE — Progress Notes (Signed)
Internal Medicine Clinic Attending  Case discussed with Dr. Krienke at the time of the visit.  We reviewed the resident's history and exam and pertinent patient test results.  I agree with the assessment, diagnosis, and plan of care documented in the resident's note.    

## 2019-07-06 ENCOUNTER — Telehealth: Payer: Self-pay | Admitting: Licensed Clinical Social Worker

## 2019-07-06 NOTE — Telephone Encounter (Signed)
Patient was called to discuss a referral for services. Patient agreed, and she will be added to my schedule for 6/23 @ 10:30 via phone.

## 2019-07-07 DIAGNOSIS — I129 Hypertensive chronic kidney disease with stage 1 through stage 4 chronic kidney disease, or unspecified chronic kidney disease: Secondary | ICD-10-CM | POA: Diagnosis not present

## 2019-07-07 DIAGNOSIS — Z79899 Other long term (current) drug therapy: Secondary | ICD-10-CM | POA: Diagnosis not present

## 2019-07-07 DIAGNOSIS — H3530 Unspecified macular degeneration: Secondary | ICD-10-CM | POA: Diagnosis not present

## 2019-07-07 DIAGNOSIS — E1165 Type 2 diabetes mellitus with hyperglycemia: Secondary | ICD-10-CM | POA: Diagnosis not present

## 2019-07-07 DIAGNOSIS — Z9181 History of falling: Secondary | ICD-10-CM | POA: Diagnosis not present

## 2019-07-07 DIAGNOSIS — M1990 Unspecified osteoarthritis, unspecified site: Secondary | ICD-10-CM | POA: Diagnosis not present

## 2019-07-07 DIAGNOSIS — E118 Type 2 diabetes mellitus with unspecified complications: Secondary | ICD-10-CM | POA: Diagnosis not present

## 2019-07-07 DIAGNOSIS — Z9011 Acquired absence of right breast and nipple: Secondary | ICD-10-CM | POA: Diagnosis not present

## 2019-07-07 DIAGNOSIS — E039 Hypothyroidism, unspecified: Secondary | ICD-10-CM | POA: Diagnosis not present

## 2019-07-07 DIAGNOSIS — Z794 Long term (current) use of insulin: Secondary | ICD-10-CM | POA: Diagnosis not present

## 2019-07-07 DIAGNOSIS — N189 Chronic kidney disease, unspecified: Secondary | ICD-10-CM | POA: Diagnosis not present

## 2019-07-07 DIAGNOSIS — H409 Unspecified glaucoma: Secondary | ICD-10-CM | POA: Diagnosis not present

## 2019-07-07 DIAGNOSIS — G9340 Encephalopathy, unspecified: Secondary | ICD-10-CM | POA: Diagnosis not present

## 2019-07-07 DIAGNOSIS — I972 Postmastectomy lymphedema syndrome: Secondary | ICD-10-CM | POA: Diagnosis not present

## 2019-07-07 DIAGNOSIS — E1142 Type 2 diabetes mellitus with diabetic polyneuropathy: Secondary | ICD-10-CM | POA: Diagnosis not present

## 2019-07-07 DIAGNOSIS — E1122 Type 2 diabetes mellitus with diabetic chronic kidney disease: Secondary | ICD-10-CM | POA: Diagnosis not present

## 2019-07-07 DIAGNOSIS — R1312 Dysphagia, oropharyngeal phase: Secondary | ICD-10-CM | POA: Diagnosis not present

## 2019-07-12 DIAGNOSIS — H409 Unspecified glaucoma: Secondary | ICD-10-CM | POA: Diagnosis not present

## 2019-07-12 DIAGNOSIS — Z9011 Acquired absence of right breast and nipple: Secondary | ICD-10-CM | POA: Diagnosis not present

## 2019-07-12 DIAGNOSIS — E1142 Type 2 diabetes mellitus with diabetic polyneuropathy: Secondary | ICD-10-CM | POA: Diagnosis not present

## 2019-07-12 DIAGNOSIS — E1165 Type 2 diabetes mellitus with hyperglycemia: Secondary | ICD-10-CM | POA: Diagnosis not present

## 2019-07-12 DIAGNOSIS — Z9181 History of falling: Secondary | ICD-10-CM | POA: Diagnosis not present

## 2019-07-12 DIAGNOSIS — M1990 Unspecified osteoarthritis, unspecified site: Secondary | ICD-10-CM | POA: Diagnosis not present

## 2019-07-12 DIAGNOSIS — I129 Hypertensive chronic kidney disease with stage 1 through stage 4 chronic kidney disease, or unspecified chronic kidney disease: Secondary | ICD-10-CM | POA: Diagnosis not present

## 2019-07-12 DIAGNOSIS — E1122 Type 2 diabetes mellitus with diabetic chronic kidney disease: Secondary | ICD-10-CM | POA: Diagnosis not present

## 2019-07-12 DIAGNOSIS — G9340 Encephalopathy, unspecified: Secondary | ICD-10-CM | POA: Diagnosis not present

## 2019-07-12 DIAGNOSIS — H3530 Unspecified macular degeneration: Secondary | ICD-10-CM | POA: Diagnosis not present

## 2019-07-12 DIAGNOSIS — E039 Hypothyroidism, unspecified: Secondary | ICD-10-CM | POA: Diagnosis not present

## 2019-07-12 DIAGNOSIS — R1312 Dysphagia, oropharyngeal phase: Secondary | ICD-10-CM | POA: Diagnosis not present

## 2019-07-12 DIAGNOSIS — Z79899 Other long term (current) drug therapy: Secondary | ICD-10-CM | POA: Diagnosis not present

## 2019-07-12 DIAGNOSIS — I972 Postmastectomy lymphedema syndrome: Secondary | ICD-10-CM | POA: Diagnosis not present

## 2019-07-12 DIAGNOSIS — Z794 Long term (current) use of insulin: Secondary | ICD-10-CM | POA: Diagnosis not present

## 2019-07-12 DIAGNOSIS — N189 Chronic kidney disease, unspecified: Secondary | ICD-10-CM | POA: Diagnosis not present

## 2019-07-13 NOTE — Progress Notes (Signed)
Internal Medicine Clinic Resident  I have personally reviewed this encounter including the documentation in this note and/or discussed this patient with the care management provider. I will address any urgent items identified by the care management provider and will communicate my actions to the patient's PCP. I have reviewed the patient's CCM visit with my supervising attending, Dr Heber Baden.  Earlene Plater, MD 07/13/2019

## 2019-07-14 ENCOUNTER — Ambulatory Visit: Payer: Self-pay

## 2019-07-14 ENCOUNTER — Telehealth: Payer: Medicare Other

## 2019-07-14 DIAGNOSIS — R1312 Dysphagia, oropharyngeal phase: Secondary | ICD-10-CM | POA: Diagnosis not present

## 2019-07-14 DIAGNOSIS — E1122 Type 2 diabetes mellitus with diabetic chronic kidney disease: Secondary | ICD-10-CM | POA: Diagnosis not present

## 2019-07-14 DIAGNOSIS — E1142 Type 2 diabetes mellitus with diabetic polyneuropathy: Secondary | ICD-10-CM | POA: Diagnosis not present

## 2019-07-14 DIAGNOSIS — Z79899 Other long term (current) drug therapy: Secondary | ICD-10-CM | POA: Diagnosis not present

## 2019-07-14 DIAGNOSIS — Z794 Long term (current) use of insulin: Secondary | ICD-10-CM | POA: Diagnosis not present

## 2019-07-14 DIAGNOSIS — I972 Postmastectomy lymphedema syndrome: Secondary | ICD-10-CM | POA: Diagnosis not present

## 2019-07-14 DIAGNOSIS — H3530 Unspecified macular degeneration: Secondary | ICD-10-CM | POA: Diagnosis not present

## 2019-07-14 DIAGNOSIS — I129 Hypertensive chronic kidney disease with stage 1 through stage 4 chronic kidney disease, or unspecified chronic kidney disease: Secondary | ICD-10-CM | POA: Diagnosis not present

## 2019-07-14 DIAGNOSIS — H409 Unspecified glaucoma: Secondary | ICD-10-CM | POA: Diagnosis not present

## 2019-07-14 DIAGNOSIS — Z9181 History of falling: Secondary | ICD-10-CM | POA: Diagnosis not present

## 2019-07-14 DIAGNOSIS — Z9011 Acquired absence of right breast and nipple: Secondary | ICD-10-CM | POA: Diagnosis not present

## 2019-07-14 DIAGNOSIS — M1990 Unspecified osteoarthritis, unspecified site: Secondary | ICD-10-CM | POA: Diagnosis not present

## 2019-07-14 DIAGNOSIS — N189 Chronic kidney disease, unspecified: Secondary | ICD-10-CM | POA: Diagnosis not present

## 2019-07-14 DIAGNOSIS — E039 Hypothyroidism, unspecified: Secondary | ICD-10-CM | POA: Diagnosis not present

## 2019-07-14 DIAGNOSIS — G9340 Encephalopathy, unspecified: Secondary | ICD-10-CM | POA: Diagnosis not present

## 2019-07-14 DIAGNOSIS — E1165 Type 2 diabetes mellitus with hyperglycemia: Secondary | ICD-10-CM | POA: Diagnosis not present

## 2019-07-14 NOTE — Chronic Care Management (AMB) (Signed)
  Chronic Care Management   Outreach Note  07/14/2019 Name: [name] MRN: [id number] DOB: 1950/03/10  Referred by: Aldine Contes, MD Reason for referral : Care Coordination [facility])   An unsuccessful telephone outreach was attempted today. The patient was referred to the case management team for assistance with care management and care coordination.   Follow Up Plan: A HIPPA compliant phone message was left at home number listed for patient.  Also left message with patient's son, [name], requesting return call.        Ronn Melena, [place] Coordination Social Worker [place] [phone]

## 2019-07-16 NOTE — Progress Notes (Signed)
Internal Medicine Clinic Attending  CCM services provided by the care management provider and their documentation were discussed with Dr. Sheppard Coil. We reviewed the pertinent findings, urgent action items addressed by the resident and non-urgent items to be addressed by the PCP.  I agree with the assessment, diagnosis, and plan of care documented in the CCM and resident's note.  Lucious Groves, DO 07/16/2019

## 2019-07-20 DIAGNOSIS — E039 Hypothyroidism, unspecified: Secondary | ICD-10-CM | POA: Diagnosis not present

## 2019-07-20 DIAGNOSIS — Z79899 Other long term (current) drug therapy: Secondary | ICD-10-CM | POA: Diagnosis not present

## 2019-07-20 DIAGNOSIS — H3530 Unspecified macular degeneration: Secondary | ICD-10-CM | POA: Diagnosis not present

## 2019-07-20 DIAGNOSIS — H409 Unspecified glaucoma: Secondary | ICD-10-CM | POA: Diagnosis not present

## 2019-07-20 DIAGNOSIS — G9340 Encephalopathy, unspecified: Secondary | ICD-10-CM | POA: Diagnosis not present

## 2019-07-20 DIAGNOSIS — N189 Chronic kidney disease, unspecified: Secondary | ICD-10-CM | POA: Diagnosis not present

## 2019-07-20 DIAGNOSIS — Z9181 History of falling: Secondary | ICD-10-CM | POA: Diagnosis not present

## 2019-07-20 DIAGNOSIS — M1990 Unspecified osteoarthritis, unspecified site: Secondary | ICD-10-CM | POA: Diagnosis not present

## 2019-07-20 DIAGNOSIS — I972 Postmastectomy lymphedema syndrome: Secondary | ICD-10-CM | POA: Diagnosis not present

## 2019-07-20 DIAGNOSIS — Z9011 Acquired absence of right breast and nipple: Secondary | ICD-10-CM | POA: Diagnosis not present

## 2019-07-20 DIAGNOSIS — E1165 Type 2 diabetes mellitus with hyperglycemia: Secondary | ICD-10-CM | POA: Diagnosis not present

## 2019-07-20 DIAGNOSIS — E1122 Type 2 diabetes mellitus with diabetic chronic kidney disease: Secondary | ICD-10-CM | POA: Diagnosis not present

## 2019-07-20 DIAGNOSIS — E1142 Type 2 diabetes mellitus with diabetic polyneuropathy: Secondary | ICD-10-CM | POA: Diagnosis not present

## 2019-07-20 DIAGNOSIS — Z794 Long term (current) use of insulin: Secondary | ICD-10-CM | POA: Diagnosis not present

## 2019-07-20 DIAGNOSIS — R1312 Dysphagia, oropharyngeal phase: Secondary | ICD-10-CM | POA: Diagnosis not present

## 2019-07-20 DIAGNOSIS — I129 Hypertensive chronic kidney disease with stage 1 through stage 4 chronic kidney disease, or unspecified chronic kidney disease: Secondary | ICD-10-CM | POA: Diagnosis not present

## 2019-07-21 ENCOUNTER — Other Ambulatory Visit: Payer: Self-pay

## 2019-07-21 ENCOUNTER — Telehealth: Payer: Medicare Other

## 2019-07-21 ENCOUNTER — Ambulatory Visit (INDEPENDENT_AMBULATORY_CARE_PROVIDER_SITE_OTHER): Payer: Medicare Other | Admitting: Internal Medicine

## 2019-07-21 ENCOUNTER — Ambulatory Visit: Payer: Medicare Other | Admitting: *Deleted

## 2019-07-21 ENCOUNTER — Telehealth: Payer: Self-pay | Admitting: Licensed Clinical Social Worker

## 2019-07-21 ENCOUNTER — Ambulatory Visit: Payer: Medicare Other | Admitting: Licensed Clinical Social Worker

## 2019-07-21 ENCOUNTER — Encounter: Payer: Self-pay | Admitting: Internal Medicine

## 2019-07-21 VITALS — BP 116/80 | HR 105 | Temp 98.0°F | Ht 62.0 in | Wt 109.4 lb

## 2019-07-21 DIAGNOSIS — I10 Essential (primary) hypertension: Secondary | ICD-10-CM

## 2019-07-21 DIAGNOSIS — N183 Chronic kidney disease, stage 3 unspecified: Secondary | ICD-10-CM

## 2019-07-21 DIAGNOSIS — R000 Tachycardia, unspecified: Secondary | ICD-10-CM

## 2019-07-21 DIAGNOSIS — E1142 Type 2 diabetes mellitus with diabetic polyneuropathy: Secondary | ICD-10-CM

## 2019-07-21 DIAGNOSIS — Z794 Long term (current) use of insulin: Secondary | ICD-10-CM

## 2019-07-21 DIAGNOSIS — E039 Hypothyroidism, unspecified: Secondary | ICD-10-CM | POA: Diagnosis not present

## 2019-07-21 LAB — POCT GLYCOSYLATED HEMOGLOBIN (HGB A1C): Hemoglobin A1C: 7.7 % — AB (ref 4.0–5.6)

## 2019-07-21 LAB — GLUCOSE, CAPILLARY: Glucose-Capillary: 151 mg/dL — ABNORMAL HIGH (ref 70–99)

## 2019-07-21 NOTE — Assessment & Plan Note (Signed)
Patient is currently on ozempic 0.25 mg weekly and metformin 1000 mg daily. This was adjusted on her last visit due to issues with GI side effects. She reports that she is still having some loose stools when she takes her metformin, has about 2 stools and it's a mud like consistency, she is taking imodium which she reports helps. She feels that this is manageable. She does not appear volume overloaded on exam. She reports that her blood sugars have been around 120 at home. A1c today is 7.7, down from 14 on prior check. She has issues with hypoglycemia in the past. Given her age her goal may be around 7.5. Will hold off on adjusting her medications at this time and have her follow up with PCP to adjust medications if a stricter A1c requirement is warranted.   -Continue ozempic 0.25 mg weekly -Continue metformin 1000 mg XR daily -Could consider starting an SGLT-2 inhibitor in the future -Advised to continue checking CBGs and bring in meter on next visit

## 2019-07-21 NOTE — Assessment & Plan Note (Signed)
Patient is currently on synthroid 75 mcg daily. Last TSH was 06/30/19 was 0.044 and her synthroid was decreased from 88 mcg > 75 mcg at that time. She reports that she has been taking this with no issues. She still endorses decreased appetite. She may still be taking too much synthroid however she is not due to have her TSH checked at this time.   -Repeat TSH in 3 weeks -Continue synthroid 75 mcg daily

## 2019-07-21 NOTE — Patient Instructions (Signed)
Visit Information It was nice speaking with you today.  Goals Addressed              This Visit's Progress     Patient Stated   .  " I don't know why my blood sugar was so high when they took me to the hospital in March. I think I was taking my medications like I'm suppose to." (pt-stated)        CARE PLAN ENTRY (see longitudinal plan of care for additional care plan information)  Current Barriers:  . Chronic Disease Management support, education, and care coordination needs related to HTN, HLD, DMII, CKD Stage 3, and Depression- Clinical Goal(s) related to  HTN, HLD, DMII, CKD Stage 3, and Depression- spoke with patient,  she says she received a good report from her doctor during her clinic appointment today except that she continues to lose weight and still doesn't have an appetite,  she says her blood sugars are "OK" but couldn't recall a specific readings, she denies hypoglycemia or symptoms of hypoglycemia, she says she is taking her medications as prescribed. She reports she is still receiving home health physical therapy and feels she's making small gains. but doesn't feel like she is getting any stronger. She says she will see a neurologist in July for assessment of her ongoing right hand weakness. Says she has not made follow up appointments with Dr Jeffie Pollock or with [facility]. She says she is still living in [facility] with her son [name] but the family is making plans for her to return to her home in [place] soon. She says she is not receiving PCS at this time.  .  Over the next 30 days, patient will:  . Work with the care management team to address educational, disease management, and care coordination needs  . Begin or continue self health monitoring activities as directed today Measure and record CBG (blood glucose) 2 times daily and take medications as prescribed . Call provider office for new or worsened signs and symptoms HTN, HLD, DMII, CKD  Stage 3, and Depression . Call care management team with questions or concerns . Verbalize basic understanding of patient centered plan of care established today  Interventions related to HTN, HLD, DMII, CKD Stage 3, and Depression . Assessed patient's DM self management strategies and reviewed blood glucose home readings-provided positive reinforcement for the significant improvement in her hgb A1C of 7.7% today during her clinic visit, down from 14.4% in  March . Assessed if patient is currently receiving PCS and notified [place] that she is not . Reinforced importance of making follow up appointments with urology and clinic counselor with patient . Reviewed upcoming appointments with neurologist on 7/13 and with primary care provider on 7/20  Patient Self Care Activities related to HTN, HLD, DMII, CKD Stage 3, and Depression Patient is unable to independently self-manage chronic health conditions  Please see past updates related to this goal by clicking on the "Past Updates" button in the selected goal         The patient verbalized understanding of instructions provided today and declined a print copy of patient instruction materials.   The care management team will reach out to the patient again over the next 30-60 days.   Kelli Churn RN, CCM, [facility] RN Care Manager [phone]

## 2019-07-21 NOTE — Telephone Encounter (Signed)
Patient was called for her scheduled appointment. Patient reported that she felt her mood had improved, and she no longer needed counseling services. Patient understands that she can change her mind at any time, and request to schedule an appointment with myself.

## 2019-07-21 NOTE — Patient Instructions (Signed)
[name],  It was a pleasure to see you today. Thank you for coming in.   Today we discussed your diabetes. In regards to this please continue taking the medications as prescribed. Your A1c today is 7.7, which is greatly improved from your previous readings. Please continue to monitor your blood sugars at home and bring in your meter on your next visit.   We also discussed your hypothyroidism and decreased appetite. Please continue taking your medications as prescribed. We will repeat labs when you follow up.   Please return to clinic in 3 weeks or sooner if needed.   Thank you again for coming in.   [name].D.

## 2019-07-21 NOTE — Chronic Care Management (AMB) (Signed)
Chronic Care Management   Follow Up Note   07/21/2019 Name: [name] MRN: [id number] DOB: [birth year]  Referred by: Aldine Contes, MD Reason for referral : Chronic Care Management (DM, HTN, CKD)   [name] is a 69 y.o. year old female who is a primary care patient of Aldine Contes, MD. The CCM team was consulted for assistance with chronic disease management and care coordination needs.    Review of patient status, including review of consultants reports, relevant laboratory and other test results, and collaboration with appropriate care team members and the patient's provider was performed as part of comprehensive patient evaluation and provision of chronic care management services.    SDOH (Social Determinants of Health) assessments performed: No See Care Plan activities for detailed interventions related to [facility])     Outpatient Encounter Medications as of 07/21/2019  Medication Sig Note  . Accu-Chek Softclix Lancets lancets Check blood sugar 5 times a day as instructed   . Acetaminophen 325 MG CAPS Take 325 mg by mouth every 6 (six) hours as needed (for pain).   . ALPHAGAN P 0.1 % SOLN Place 1 drop into both eyes 3 (three) times daily. 04/17/2019: No record of this in past 6 months per external pharmacy records  . Besifloxacin HCl (BESIVANCE) 0.6 % SUSP Besivance 0.6 % eye drops,suspension  INSTILL 1 DROP INTO THE LEFT EYE QID X 2 DAYS AFTER EACH MONTHLY EYE INJECTION 04/17/2019: No record of this in past 6 months per external pharmacy records  . Blood Glucose Monitoring Suppl (ACCU-CHEK GUIDE ME) w/Device KIT Check blood sugar 5 times a day   . busPIRone (BUSPAR) 5 MG tablet Take 1 tablet (5 mg total) by mouth 2 (two) times daily.   . calcium citrate-vitamin D (CITRACAL+D) 315-200 MG-UNIT tablet Take 1 tablet by mouth 2 (two) times daily. (Patient not taking: Reported on 11/20/2017)   . Cholecalciferol (VITAMIN D3) 2000 units TABS Take 2,000 Units at bedtime by mouth.     . ciprofloxacin (CILOXAN) 0.3 % ophthalmic ointment  04/17/2019: No record of this in past 6 months per external pharmacy records  . cycloSPORINE (RESTASIS) 0.05 % ophthalmic emulsion Restasis MultiDose 0.05 % eye drops  INT 1 GTT IN [facility] EYE BID 04/17/2019: No record of this in past 6 months per external pharmacy records  . erythromycin ophthalmic ointment APPLY A SMALL AMOUNT INTO LEFT EYE TID 04/17/2019: No record of this in past 6 months per external pharmacy records  . glucose 4 GM chewable tablet Chew 4 tablets (16 g total) by mouth as needed for low blood sugar. 04/17/2019: No record of this in past 6 months per external pharmacy records  . glucose blood (ACCU-CHEK GUIDE) test strip Check blood sugar 5 times per day   . Insulin Pen Needle (BD PEN NEEDLE NANO U/F) 32G X 4 MM MISC USE AS DIRECTED   . ketorolac (ACULAR) 0.5 % ophthalmic solution Place 1 drop into both eyes 3 (three) times daily. 04/17/2019: No record of this in past 6 months per external pharmacy records  . levothyroxine (SYNTHROID) 75 MCG tablet Take 1 tablet (75 mcg total) by mouth daily before breakfast.   . loperamide (IMODIUM) 2 MG capsule Take 1 capsule (2 mg total) by mouth as needed for diarrhea or loose stools.   [name] losartan (COZAAR) 25 MG tablet Take 12.5 mg by mouth daily. 05/26/2019: Her son [name] says he thinks this medication was started while patient was in [facility]- she has been out of  it for a week so needs refill if she is to continue it  . metFORMIN (GLUCOPHAGE) 500 MG tablet Take 2 tablets (1,000 mg total) by mouth 2 (two) times daily with a meal.   . Nutritional Supplements (GLUCERNA SNACK SHAKE) LIQD Take 1 Dose by mouth 3 (three) times daily as needed. (Patient not taking: Reported on 05/26/2019) 05/26/2019: She is drinking vanilla ensure instead of Glucerna  . Olopatadine HCl (PATADAY) 0.2 % SOLN Place 1 drop into both eyes daily as needed (dry eyes).  04/17/2019: No record of this in past 6 months per external  pharmacy records  . Polyethyl Glycol-Propyl Glycol (SYSTANE) 0.4-0.3 % GEL ophthalmic gel Place 1 application daily as needed into both eyes (dry eyes/irritation).    . potassium chloride SA (KLOR-CON) 20 MEQ tablet Take 1 tablet (20 mEq total) by mouth daily for 3 days.   . prednisoLONE acetate (PRED FORTE) 1 % ophthalmic suspension  04/17/2019: No record of this in past 6 months per external pharmacy records  . rosuvastatin (CRESTOR) 20 MG tablet Take 1 tablet (20 mg total) by mouth daily.   . Semaglutide,0.25 or 0.5MG/DOS, (OZEMPIC, 0.25 OR 0.5 MG/DOSE,) 2 MG/1.5ML SOPN Inject 0.25 mg into the skin once a week.   . valACYclovir (VALTREX) 500 MG tablet TK 1 T PO QD 05/26/2019: She is out and needs refill if she is to continue this medication  . venlafaxine XR (EFFEXOR-XR) 150 MG 24 hr capsule TAKE 1 CAPSULE BY MOUTH DAILY WITH BREAKFAST    No facility-administered encounter medications on file as of 07/21/2019.     Objective:   Goals Addressed              This Visit's Progress     Patient Stated   .  " I don't know why my blood sugar was so high when they took me to the hospital in March. I think I was taking my medications like I'm suppose to." (pt-stated)        CARE PLAN ENTRY (see longitudinal plan of care for additional care plan information)  Current Barriers:  . Chronic Disease Management support, education, and care coordination needs related to HTN, HLD, DMII, CKD Stage 3, and Depression- Clinical Goal(s) related to  HTN, HLD, DMII, CKD Stage 3, and Depression- spoke with patient,  she says she received a good report from her doctor during her clinic appointment today except that she continues to lose weight and still doesn't have an appetite,  she says her blood sugars are "OK" but couldn't recall a specific readings, she denies hypoglycemia or symptoms of hypoglycemia, she says she is taking her medications as prescribed. She reports she is still receiving home health physical  therapy and feels she's making small gains. but doesn't feel like she is getting any stronger. She says she will see a neurologist in July for assessment of her ongoing right hand weakness. Says she has not made follow up appointments with Dr Jeffie Pollock or with [facility]. She says she is still living in [facility] with her son [name] but the family is making plans for her to return to her home in [place] soon. She says she is not receiving PCS at this time.  .  Over the next 30 days, patient will:  . Work with the care management team to address educational, disease management, and care coordination needs  . Begin or continue self health monitoring activities as directed today Measure and record CBG (  blood glucose) 2 times daily and take medications as prescribed . Call provider office for new or worsened signs and symptoms HTN, HLD, DMII, CKD Stage 3, and Depression . Call care management team with questions or concerns . Verbalize basic understanding of patient centered plan of care established today  Interventions related to HTN, HLD, DMII, CKD Stage 3, and Depression . Assessed patient's DM self management strategies and reviewed blood glucose home readings-provided positive reinforcement for the significant improvement in her hgb A1C of 7.7% today during her clinic visit, down from 14.4% in March . Assessed if patient is currently receiving PCS and notified [place] that she is not . Reinforced importance of making follow up appointments with urology and clinic counselor with patient . Reviewed upcoming appointments with neurologist on 7/13 and with primary care provider on 7/20  Patient Self Care Activities related to HTN, HLD, DMII, CKD Stage 3, and Depression Patient is unable to independently self-manage chronic health conditions  Please see past updates related to this goal by clicking on the "Past Updates" button in the selected goal            Plan:   The care management team will reach out to the patient again over the next 30-60 days.    Kelli Churn RN, CCM, [facility] RN Care Manager [phone]

## 2019-07-21 NOTE — Progress Notes (Signed)
   CC: Diabetes, hypothyroidism, and decreased appetite  HPI:  [name] is a 69 y.o. with the history listed below including hypothyroidism, depression, and diabetes presenting for follow up for diabetes, hypothyroidism, and decreased appetite.  Past Medical History:  Diagnosis Date  . Arthritis   . Bilateral hip pain 02/24/2015  . Borderline glaucoma of both eyes   . Depression   . Diabetic polyneuropathy ([place])   . Diverticulosis of colon   . Dizziness 12/05/2017  . Dry eyes, bilateral   . Feeling of incomplete bladder emptying   . Frequent falls 01/08/2018  . History of breast cancer oncologist-  dr Waymon Budge-- per lov note no recurrence   dx 04/ 1999 --- Stage 3B-- s/p  chemotherapy then right mastectomy then concurrent chemoradiation therapy  . History of urinary retention    01/ 2018  . Hydronephrosis of right kidney   . Hyperlipidemia   . Hypertension   . Hypokalemia 12/09/2016  . Hypomagnesemia 12/09/2016  . Hypothyroidism   . Insulin dependent type 2 diabetes mellitus [facility])    endocrinologist-  dr Cruzita Lederer---  last A1c 8.7 on 02-20-2016  . Lymphedema of upper extremity    right  . Macular degeneration, left eye    followed by dr Zigmund Daniel  . Renal insufficiency   . Seizure-like activity ([place]) 04/17/2019  . Urgency of urination    Review of Systems:   Constitutional: Negative for chills and fever.  Respiratory: Negative for shortness of breath.   Cardiovascular: Negative for chest pain and leg swelling.  Gastrointestinal: Negative for abdominal pain, nausea and vomiting.  Neurological: Negative for dizziness and headaches.   Physical Exam:  Vitals:   07/21/19 1343  BP: 116/80  Pulse: (!) 105  Temp: 98 F (36.7 C)  TempSrc: Oral  SpO2: 100%  Weight: 109 lb 6.4 oz (49.6 kg)  Height: 5\' 2"  (1.575 m)   Physical Exam Constitutional:      Appearance: Normal appearance.  HENT:     Head: Normocephalic and atraumatic.     Mouth/Throat:     Mouth:  Mucous membranes are moist.     Pharynx: Oropharynx is clear.  Eyes:     Extraocular Movements: Extraocular movements intact.     Conjunctiva/sclera: Conjunctivae normal.     Pupils: Pupils are equal, round, and reactive to light.  Cardiovascular:     Rate and Rhythm: Regular rhythm. Tachycardia present.     Pulses: Normal pulses.     Heart sounds: Normal heart sounds.  Pulmonary:     Effort: Pulmonary effort is normal.     Breath sounds: Normal breath sounds.  Abdominal:     General: Abdomen is flat. Bowel sounds are normal.     Palpations: Abdomen is soft.  Musculoskeletal:        General: Normal range of motion.  Skin:    General: Skin is warm and dry.     Capillary Refill: Capillary refill takes less than 2 seconds.  Neurological:     General: No focal deficit present.     Mental Status: She is alert and oriented to person, place, and time.  Psychiatric:        Mood and Affect: Mood normal.        Behavior: Behavior normal.    Assessment & Plan:   See Encounters Tab for problem based charting.  Patient discussed with Dr. Philipp Ovens

## 2019-07-22 DIAGNOSIS — E039 Hypothyroidism, unspecified: Secondary | ICD-10-CM | POA: Diagnosis not present

## 2019-07-22 DIAGNOSIS — H409 Unspecified glaucoma: Secondary | ICD-10-CM | POA: Diagnosis not present

## 2019-07-22 DIAGNOSIS — I129 Hypertensive chronic kidney disease with stage 1 through stage 4 chronic kidney disease, or unspecified chronic kidney disease: Secondary | ICD-10-CM | POA: Diagnosis not present

## 2019-07-22 DIAGNOSIS — E1122 Type 2 diabetes mellitus with diabetic chronic kidney disease: Secondary | ICD-10-CM | POA: Diagnosis not present

## 2019-07-22 DIAGNOSIS — Z794 Long term (current) use of insulin: Secondary | ICD-10-CM | POA: Diagnosis not present

## 2019-07-22 DIAGNOSIS — G9340 Encephalopathy, unspecified: Secondary | ICD-10-CM | POA: Diagnosis not present

## 2019-07-22 DIAGNOSIS — M1990 Unspecified osteoarthritis, unspecified site: Secondary | ICD-10-CM | POA: Diagnosis not present

## 2019-07-22 DIAGNOSIS — R1312 Dysphagia, oropharyngeal phase: Secondary | ICD-10-CM | POA: Diagnosis not present

## 2019-07-22 DIAGNOSIS — I972 Postmastectomy lymphedema syndrome: Secondary | ICD-10-CM | POA: Diagnosis not present

## 2019-07-22 DIAGNOSIS — Z9181 History of falling: Secondary | ICD-10-CM | POA: Diagnosis not present

## 2019-07-22 DIAGNOSIS — H3530 Unspecified macular degeneration: Secondary | ICD-10-CM | POA: Diagnosis not present

## 2019-07-22 DIAGNOSIS — E1142 Type 2 diabetes mellitus with diabetic polyneuropathy: Secondary | ICD-10-CM | POA: Diagnosis not present

## 2019-07-22 DIAGNOSIS — E1165 Type 2 diabetes mellitus with hyperglycemia: Secondary | ICD-10-CM | POA: Diagnosis not present

## 2019-07-22 DIAGNOSIS — Z9011 Acquired absence of right breast and nipple: Secondary | ICD-10-CM | POA: Diagnosis not present

## 2019-07-22 DIAGNOSIS — Z79899 Other long term (current) drug therapy: Secondary | ICD-10-CM | POA: Diagnosis not present

## 2019-07-22 DIAGNOSIS — N189 Chronic kidney disease, unspecified: Secondary | ICD-10-CM | POA: Diagnosis not present

## 2019-07-22 NOTE — Progress Notes (Signed)
Internal Medicine Clinic Resident  I have personally reviewed this encounter including the documentation in this note and/or discussed this patient with the care management provider. I will address any urgent items identified by the care management provider and will communicate my actions to the patient's PCP. I have reviewed the patient's CCM visit with my supervising attending, Dr Raines.  Azlynn Mitnick, MD 07/22/2019    

## 2019-07-23 ENCOUNTER — Ambulatory Visit: Payer: Self-pay

## 2019-07-23 ENCOUNTER — Telehealth: Payer: Medicare Other

## 2019-07-23 NOTE — Chronic Care Management (AMB) (Signed)
  Chronic Care Management   Outreach Note  07/23/2019 Name: [name] MRN: [id number] DOB: 1950-11-17  Referred by: Aldine Contes, MD Reason for referral : Care Coordination [facility])   Second attempt to follow up on status of PCS.  Left messages for patient and her son, [name].    Follow Up Plan: Will attempt to reach again next week if no return call before then.     Ronn Melena, [place] Coordination Social Worker [place] [phone]

## 2019-07-23 NOTE — Progress Notes (Signed)
Internal Medicine Clinic Attending  Case discussed with Dr. Krienke at the time of the visit.  We reviewed the resident's history and exam and pertinent patient test results.  I agree with the assessment, diagnosis, and plan of care documented in the resident's note.    

## 2019-07-23 NOTE — Progress Notes (Signed)
Internal Medicine Clinic Attending  CCM services provided by the care management provider and their documentation were reviewed with Dr. Basaraba.  We reviewed the pertinent findings, urgent action items addressed by the resident and non-urgent items to be addressed by the PCP.  I agree with the assessment, diagnosis, and plan of care documented in the CCM and resident's note.  Robbert Langlinais N Ola Raap, MD 07/23/2019  

## 2019-07-26 DIAGNOSIS — Z9011 Acquired absence of right breast and nipple: Secondary | ICD-10-CM | POA: Diagnosis not present

## 2019-07-26 DIAGNOSIS — E1122 Type 2 diabetes mellitus with diabetic chronic kidney disease: Secondary | ICD-10-CM | POA: Diagnosis not present

## 2019-07-26 DIAGNOSIS — H3530 Unspecified macular degeneration: Secondary | ICD-10-CM | POA: Diagnosis not present

## 2019-07-26 DIAGNOSIS — R1312 Dysphagia, oropharyngeal phase: Secondary | ICD-10-CM | POA: Diagnosis not present

## 2019-07-26 DIAGNOSIS — I129 Hypertensive chronic kidney disease with stage 1 through stage 4 chronic kidney disease, or unspecified chronic kidney disease: Secondary | ICD-10-CM | POA: Diagnosis not present

## 2019-07-26 DIAGNOSIS — N189 Chronic kidney disease, unspecified: Secondary | ICD-10-CM | POA: Diagnosis not present

## 2019-07-26 DIAGNOSIS — I972 Postmastectomy lymphedema syndrome: Secondary | ICD-10-CM | POA: Diagnosis not present

## 2019-07-26 DIAGNOSIS — Z79899 Other long term (current) drug therapy: Secondary | ICD-10-CM | POA: Diagnosis not present

## 2019-07-26 DIAGNOSIS — E1142 Type 2 diabetes mellitus with diabetic polyneuropathy: Secondary | ICD-10-CM | POA: Diagnosis not present

## 2019-07-26 DIAGNOSIS — M1990 Unspecified osteoarthritis, unspecified site: Secondary | ICD-10-CM | POA: Diagnosis not present

## 2019-07-26 DIAGNOSIS — E039 Hypothyroidism, unspecified: Secondary | ICD-10-CM | POA: Diagnosis not present

## 2019-07-26 DIAGNOSIS — E1165 Type 2 diabetes mellitus with hyperglycemia: Secondary | ICD-10-CM | POA: Diagnosis not present

## 2019-07-26 DIAGNOSIS — Z794 Long term (current) use of insulin: Secondary | ICD-10-CM | POA: Diagnosis not present

## 2019-07-26 DIAGNOSIS — H409 Unspecified glaucoma: Secondary | ICD-10-CM | POA: Diagnosis not present

## 2019-07-26 DIAGNOSIS — Z9181 History of falling: Secondary | ICD-10-CM | POA: Diagnosis not present

## 2019-07-26 DIAGNOSIS — G9340 Encephalopathy, unspecified: Secondary | ICD-10-CM | POA: Diagnosis not present

## 2019-07-28 ENCOUNTER — Ambulatory Visit: Payer: Medicare Other

## 2019-07-28 DIAGNOSIS — Z794 Long term (current) use of insulin: Secondary | ICD-10-CM

## 2019-07-28 DIAGNOSIS — R1312 Dysphagia, oropharyngeal phase: Secondary | ICD-10-CM | POA: Diagnosis not present

## 2019-07-28 DIAGNOSIS — Z79899 Other long term (current) drug therapy: Secondary | ICD-10-CM | POA: Diagnosis not present

## 2019-07-28 DIAGNOSIS — N183 Chronic kidney disease, stage 3 unspecified: Secondary | ICD-10-CM

## 2019-07-28 DIAGNOSIS — I129 Hypertensive chronic kidney disease with stage 1 through stage 4 chronic kidney disease, or unspecified chronic kidney disease: Secondary | ICD-10-CM | POA: Diagnosis not present

## 2019-07-28 DIAGNOSIS — I10 Essential (primary) hypertension: Secondary | ICD-10-CM

## 2019-07-28 DIAGNOSIS — E039 Hypothyroidism, unspecified: Secondary | ICD-10-CM | POA: Diagnosis not present

## 2019-07-28 DIAGNOSIS — E0842 Diabetes mellitus due to underlying condition with diabetic polyneuropathy: Secondary | ICD-10-CM

## 2019-07-28 DIAGNOSIS — H409 Unspecified glaucoma: Secondary | ICD-10-CM | POA: Diagnosis not present

## 2019-07-28 DIAGNOSIS — E1142 Type 2 diabetes mellitus with diabetic polyneuropathy: Secondary | ICD-10-CM | POA: Diagnosis not present

## 2019-07-28 DIAGNOSIS — N189 Chronic kidney disease, unspecified: Secondary | ICD-10-CM | POA: Diagnosis not present

## 2019-07-28 DIAGNOSIS — G9340 Encephalopathy, unspecified: Secondary | ICD-10-CM | POA: Diagnosis not present

## 2019-07-28 DIAGNOSIS — E1122 Type 2 diabetes mellitus with diabetic chronic kidney disease: Secondary | ICD-10-CM | POA: Diagnosis not present

## 2019-07-28 DIAGNOSIS — H3530 Unspecified macular degeneration: Secondary | ICD-10-CM | POA: Diagnosis not present

## 2019-07-28 DIAGNOSIS — I972 Postmastectomy lymphedema syndrome: Secondary | ICD-10-CM | POA: Diagnosis not present

## 2019-07-28 DIAGNOSIS — M1990 Unspecified osteoarthritis, unspecified site: Secondary | ICD-10-CM | POA: Diagnosis not present

## 2019-07-28 DIAGNOSIS — Z9181 History of falling: Secondary | ICD-10-CM | POA: Diagnosis not present

## 2019-07-28 DIAGNOSIS — Z9011 Acquired absence of right breast and nipple: Secondary | ICD-10-CM | POA: Diagnosis not present

## 2019-07-28 DIAGNOSIS — E1165 Type 2 diabetes mellitus with hyperglycemia: Secondary | ICD-10-CM | POA: Diagnosis not present

## 2019-07-28 NOTE — Patient Instructions (Signed)
Visit Information  Goals Addressed              This Visit's Progress   .  "I could use some help around the house when I get back home" (pt-stated)        Current Barriers:  [name] Knowledge Barriers related to resources available for ADL IADL limitations Patient was assessed for [place] on 06/17/19.  RNCM, [name] met with patient/son during office visit on 07/21/19 and they reported that they had not yet received the Determination Letter from [facility].    . Case Manager Clinical Goal(s):  [name] Over the next 60 days, patient will work with BSW to address needs related to ADL IADL limitations; obtaining [place]  . Over the next 60 days, BSW will collaborate with RN Care Manager to address care management and care coordination needs  Interventions:  . [place] regarding status of PCS since patient/son reported during last office visit that Determination Letter had not been received.  Unfortunately, patient was not approved for services.  Patient should have received Determination Letter including instructions for appeal of decision or reassessment if interested.    . Patient Self Care Activities:  . Patient verbalizes understanding of plan to work with BSW to obtain [employer]  . Self administers medications as prescribed . Attends all scheduled provider appointments  Please see past updates related to this goal by clicking on the "Past Updates" button in the selected goal      .  COMPLETED: 'My mom could use some help when she goes back to her house including help with transportation.." (pt-stated)        CARE PLAN ENTRY (see longitudinal plan of care for additional care plan information)   Current Barriers:  . Chronic Disease Management support, education, and care coordination needs related to HTN, HLD, DMII, CKD Stage 3, and Depression- Patient is currently living with her son [name] in [place] but he says  she will eventually return to her home in [place]. He says patient hasn't driven in quite awhile due to weakness in her hand. He is interested in hearing about transportation options.  Case Manager Clinical Goal(s):  [name] Over the next 7-14 days, patient will work with BSW to address needs related to ADL IADL limitations and transportation concerns in patient with HTN, HLD, DMII, CKD Stage 3, and Depression  Interventions:  . Collaborated with [name], Program Coordinator.  Patient has been approved and certification packet was mailed to home address.  Patient Self Care Activities:  . Patient verbalizes understanding of plan to work with BSW and other health team members to address transportation needs . Self administers medications as prescribed . Attends all scheduled provider appointments . Calls pharmacy for medication refills . Performs ADL's independently . Performs IADL's independently . Calls provider office for new concerns or questions  Please see past updates related to this goal by clicking on the "Past Updates" button in the selected goal         Have attempted to follow up with patient/son x3 via phone regarding PCS and SCAT eligibility.  No response to message left.  Will plan to meet with patient during next office visit scheduled for 08/17/19.          [name], [place] Coordination Social Worker [place] [phone]

## 2019-07-28 NOTE — Progress Notes (Signed)
Internal Medicine Clinic Resident  I have personally reviewed this encounter including the documentation in this note and/or discussed this patient with the care management provider. I will address any urgent items identified by the care management provider and will communicate my actions to the patient's PCP. I have reviewed the patient's CCM visit with my supervising attending, Dr Raines.  Brandn Mcgath M Aleja Yearwood, MD 07/28/2019    

## 2019-07-28 NOTE — Chronic Care Management (AMB) (Signed)
Care Management   Follow Up Note   07/28/2019 Name: [name] MRN: [id number] DOB: Feb 06, 1950  Referred by: Aldine Contes, MD Reason for referral : Care Coordination (PCS, Transportation )   [name] is a 69 y.o. year old female who is a primary care patient of Aldine Contes, MD. The care management team was consulted for assistance with care management and care coordination needs.    Review of patient status, including review of consultants reports, relevant laboratory and other test results, and collaboration with appropriate care team members and the patient's provider was performed as part of comprehensive patient evaluation and provision of chronic care management services.    SDOH (Social Determinants of Health) assessments performed: No See Care Plan activities for detailed interventions related to [facility])     Advanced Directives: See Care Plan and Vynca application for related entries.   Goals Addressed              This Visit's Progress   .  "I could use some help around the house when I get back home" (pt-stated)        Current Barriers:  [name] Knowledge Barriers related to resources available for ADL IADL limitations Patient was assessed for [place] on 06/17/19.  RNCM, Kelli Churn met with patient/son during office visit on 07/21/19 and they reported that they had not yet received the Determination Letter from [facility].    . Case Manager Clinical Goal(s):  [name] Over the next 60 days, patient will work with BSW to address needs related to ADL IADL limitations; obtaining [place]  . Over the next 60 days, BSW will collaborate with RN Care Manager to address care management and care coordination needs  Interventions:  . [name] regarding status of PCS since patient/son reported during last office visit that Determination Letter had not been received.  Unfortunately, patient was not approved for services.   Patient should have received Determination Letter including instructions for appeal of decision or reassessment if interested.    . Patient Self Care Activities:  . Patient verbalizes understanding of plan to work with BSW to obtain [employer]  . Self administers medications as prescribed . Attends all scheduled provider appointments  Please see past updates related to this goal by clicking on the "Past Updates" button in the selected goal      .  COMPLETED: 'My mom could use some help when she goes back to her house including help with transportation.." (pt-stated)        CARE PLAN ENTRY (see longitudinal plan of care for additional care plan information)   Current Barriers:  . Chronic Disease Management support, education, and care coordination needs related to HTN, HLD, DMII, CKD Stage 3, and Depression- Patient is currently living with her son [name] in [place] but he says she will eventually return to her home in [place]. He says patient hasn't driven in quite awhile due to weakness in her hand. He is interested in hearing about transportation options.  Case Manager Clinical Goal(s):  [name] Over the next 7-14 days, patient will work with BSW to address needs related to ADL IADL limitations and transportation concerns in patient with HTN, HLD, DMII, CKD Stage 3, and Depression  Interventions:  . Collaborated with [name], Program Coordinator.  Patient has been approved and certification packet was mailed to home address.  Patient Self Care Activities:  . Patient verbalizes understanding of plan to work with [employer]  and other health team members to address transportation needs . Self administers medications as prescribed . Attends all scheduled provider appointments . Calls pharmacy for medication refills . Performs ADL's independently . Performs IADL's independently . Calls provider office for new concerns or questions  Please see past updates related to this  goal by clicking on the "Past Updates" button in the selected goal          Have attempted to follow up with patient/son x3 via phone regarding PCS and SCAT eligibility.  No response to message left.  Will plan to meet with patient during next office visit scheduled for 08/17/19.      Ronn Melena, [place] Coordination Social Worker [place] [phone]

## 2019-07-30 NOTE — Progress Notes (Signed)
Internal Medicine Clinic Attending  CCM services provided by the care management provider and their documentation were reviewed with Dr. Krienke.  We reviewed the pertinent findings, urgent action items addressed by the resident and non-urgent items to be addressed by the PCP.  I agree with the assessment, diagnosis, and plan of care documented in the CCM and resident's note.  Angeles Paolucci N Sabiha Sura, MD 07/30/2019  

## 2019-08-04 DIAGNOSIS — E1165 Type 2 diabetes mellitus with hyperglycemia: Secondary | ICD-10-CM | POA: Diagnosis not present

## 2019-08-04 DIAGNOSIS — N189 Chronic kidney disease, unspecified: Secondary | ICD-10-CM | POA: Diagnosis not present

## 2019-08-04 DIAGNOSIS — Z9011 Acquired absence of right breast and nipple: Secondary | ICD-10-CM | POA: Diagnosis not present

## 2019-08-04 DIAGNOSIS — I129 Hypertensive chronic kidney disease with stage 1 through stage 4 chronic kidney disease, or unspecified chronic kidney disease: Secondary | ICD-10-CM | POA: Diagnosis not present

## 2019-08-04 DIAGNOSIS — H409 Unspecified glaucoma: Secondary | ICD-10-CM | POA: Diagnosis not present

## 2019-08-04 DIAGNOSIS — E039 Hypothyroidism, unspecified: Secondary | ICD-10-CM | POA: Diagnosis not present

## 2019-08-04 DIAGNOSIS — E1122 Type 2 diabetes mellitus with diabetic chronic kidney disease: Secondary | ICD-10-CM | POA: Diagnosis not present

## 2019-08-04 DIAGNOSIS — H3530 Unspecified macular degeneration: Secondary | ICD-10-CM | POA: Diagnosis not present

## 2019-08-04 DIAGNOSIS — M1990 Unspecified osteoarthritis, unspecified site: Secondary | ICD-10-CM | POA: Diagnosis not present

## 2019-08-04 DIAGNOSIS — E1142 Type 2 diabetes mellitus with diabetic polyneuropathy: Secondary | ICD-10-CM | POA: Diagnosis not present

## 2019-08-04 DIAGNOSIS — R1312 Dysphagia, oropharyngeal phase: Secondary | ICD-10-CM | POA: Diagnosis not present

## 2019-08-04 DIAGNOSIS — I972 Postmastectomy lymphedema syndrome: Secondary | ICD-10-CM | POA: Diagnosis not present

## 2019-08-04 DIAGNOSIS — Z9181 History of falling: Secondary | ICD-10-CM | POA: Diagnosis not present

## 2019-08-04 DIAGNOSIS — G9340 Encephalopathy, unspecified: Secondary | ICD-10-CM | POA: Diagnosis not present

## 2019-08-04 DIAGNOSIS — Z79899 Other long term (current) drug therapy: Secondary | ICD-10-CM | POA: Diagnosis not present

## 2019-08-04 DIAGNOSIS — Z794 Long term (current) use of insulin: Secondary | ICD-10-CM | POA: Diagnosis not present

## 2019-08-05 DIAGNOSIS — I972 Postmastectomy lymphedema syndrome: Secondary | ICD-10-CM | POA: Diagnosis not present

## 2019-08-05 DIAGNOSIS — E1122 Type 2 diabetes mellitus with diabetic chronic kidney disease: Secondary | ICD-10-CM | POA: Diagnosis not present

## 2019-08-05 DIAGNOSIS — H3530 Unspecified macular degeneration: Secondary | ICD-10-CM | POA: Diagnosis not present

## 2019-08-05 DIAGNOSIS — Z79899 Other long term (current) drug therapy: Secondary | ICD-10-CM | POA: Diagnosis not present

## 2019-08-05 DIAGNOSIS — I129 Hypertensive chronic kidney disease with stage 1 through stage 4 chronic kidney disease, or unspecified chronic kidney disease: Secondary | ICD-10-CM | POA: Diagnosis not present

## 2019-08-05 DIAGNOSIS — R1312 Dysphagia, oropharyngeal phase: Secondary | ICD-10-CM | POA: Diagnosis not present

## 2019-08-05 DIAGNOSIS — H409 Unspecified glaucoma: Secondary | ICD-10-CM | POA: Diagnosis not present

## 2019-08-05 DIAGNOSIS — N189 Chronic kidney disease, unspecified: Secondary | ICD-10-CM | POA: Diagnosis not present

## 2019-08-05 DIAGNOSIS — E039 Hypothyroidism, unspecified: Secondary | ICD-10-CM | POA: Diagnosis not present

## 2019-08-05 DIAGNOSIS — Z9011 Acquired absence of right breast and nipple: Secondary | ICD-10-CM | POA: Diagnosis not present

## 2019-08-05 DIAGNOSIS — M1990 Unspecified osteoarthritis, unspecified site: Secondary | ICD-10-CM | POA: Diagnosis not present

## 2019-08-05 DIAGNOSIS — G9340 Encephalopathy, unspecified: Secondary | ICD-10-CM | POA: Diagnosis not present

## 2019-08-05 DIAGNOSIS — Z9181 History of falling: Secondary | ICD-10-CM | POA: Diagnosis not present

## 2019-08-05 DIAGNOSIS — E1142 Type 2 diabetes mellitus with diabetic polyneuropathy: Secondary | ICD-10-CM | POA: Diagnosis not present

## 2019-08-05 DIAGNOSIS — E1165 Type 2 diabetes mellitus with hyperglycemia: Secondary | ICD-10-CM | POA: Diagnosis not present

## 2019-08-05 DIAGNOSIS — Z794 Long term (current) use of insulin: Secondary | ICD-10-CM | POA: Diagnosis not present

## 2019-08-06 DIAGNOSIS — E118 Type 2 diabetes mellitus with unspecified complications: Secondary | ICD-10-CM | POA: Diagnosis not present

## 2019-08-06 DIAGNOSIS — Z794 Long term (current) use of insulin: Secondary | ICD-10-CM | POA: Diagnosis not present

## 2019-08-09 DIAGNOSIS — H3530 Unspecified macular degeneration: Secondary | ICD-10-CM | POA: Diagnosis not present

## 2019-08-09 DIAGNOSIS — I972 Postmastectomy lymphedema syndrome: Secondary | ICD-10-CM | POA: Diagnosis not present

## 2019-08-09 DIAGNOSIS — Z794 Long term (current) use of insulin: Secondary | ICD-10-CM | POA: Diagnosis not present

## 2019-08-09 DIAGNOSIS — I129 Hypertensive chronic kidney disease with stage 1 through stage 4 chronic kidney disease, or unspecified chronic kidney disease: Secondary | ICD-10-CM | POA: Diagnosis not present

## 2019-08-09 DIAGNOSIS — E1122 Type 2 diabetes mellitus with diabetic chronic kidney disease: Secondary | ICD-10-CM | POA: Diagnosis not present

## 2019-08-09 DIAGNOSIS — Z79899 Other long term (current) drug therapy: Secondary | ICD-10-CM | POA: Diagnosis not present

## 2019-08-09 DIAGNOSIS — E1142 Type 2 diabetes mellitus with diabetic polyneuropathy: Secondary | ICD-10-CM | POA: Diagnosis not present

## 2019-08-09 DIAGNOSIS — Z9181 History of falling: Secondary | ICD-10-CM | POA: Diagnosis not present

## 2019-08-09 DIAGNOSIS — M1990 Unspecified osteoarthritis, unspecified site: Secondary | ICD-10-CM | POA: Diagnosis not present

## 2019-08-09 DIAGNOSIS — R1312 Dysphagia, oropharyngeal phase: Secondary | ICD-10-CM | POA: Diagnosis not present

## 2019-08-09 DIAGNOSIS — E039 Hypothyroidism, unspecified: Secondary | ICD-10-CM | POA: Diagnosis not present

## 2019-08-09 DIAGNOSIS — Z9011 Acquired absence of right breast and nipple: Secondary | ICD-10-CM | POA: Diagnosis not present

## 2019-08-09 DIAGNOSIS — N189 Chronic kidney disease, unspecified: Secondary | ICD-10-CM | POA: Diagnosis not present

## 2019-08-09 DIAGNOSIS — H409 Unspecified glaucoma: Secondary | ICD-10-CM | POA: Diagnosis not present

## 2019-08-09 DIAGNOSIS — E1165 Type 2 diabetes mellitus with hyperglycemia: Secondary | ICD-10-CM | POA: Diagnosis not present

## 2019-08-09 DIAGNOSIS — G9340 Encephalopathy, unspecified: Secondary | ICD-10-CM | POA: Diagnosis not present

## 2019-08-10 ENCOUNTER — Encounter: Payer: Self-pay | Admitting: Neurology

## 2019-08-10 ENCOUNTER — Ambulatory Visit: Payer: Medicare Other | Admitting: Neurology

## 2019-08-10 NOTE — Addendum Note (Signed)
Addended by: Hulan Fray on: 08/10/2019 05:49 PM   Modules accepted: Orders

## 2019-08-17 ENCOUNTER — Other Ambulatory Visit: Payer: Self-pay

## 2019-08-17 ENCOUNTER — Ambulatory Visit (HOSPITAL_COMMUNITY)
Admission: RE | Admit: 2019-08-17 | Discharge: 2019-08-17 | Disposition: A | Discharge status: Home / Self Care | Payer: Medicare Other | Source: Ambulatory Visit | Attending: Internal Medicine | Admitting: Internal Medicine

## 2019-08-17 ENCOUNTER — Ambulatory Visit: Payer: Medicare Other

## 2019-08-17 ENCOUNTER — Encounter: Payer: Self-pay | Admitting: Internal Medicine

## 2019-08-17 ENCOUNTER — Ambulatory Visit: Payer: Medicare Other | Admitting: *Deleted

## 2019-08-17 ENCOUNTER — Ambulatory Visit: Payer: Medicare Other | Admitting: Internal Medicine

## 2019-08-17 VITALS — BP 89/55 | HR 107 | Temp 99.2°F | Ht 62.0 in | Wt 108.7 lb

## 2019-08-17 DIAGNOSIS — E1142 Type 2 diabetes mellitus with diabetic polyneuropathy: Secondary | ICD-10-CM

## 2019-08-17 DIAGNOSIS — R634 Abnormal weight loss: Secondary | ICD-10-CM

## 2019-08-17 DIAGNOSIS — E559 Vitamin D deficiency, unspecified: Secondary | ICD-10-CM | POA: Insufficient documentation

## 2019-08-17 DIAGNOSIS — E785 Hyperlipidemia, unspecified: Secondary | ICD-10-CM

## 2019-08-17 DIAGNOSIS — G629 Polyneuropathy, unspecified: Secondary | ICD-10-CM

## 2019-08-17 DIAGNOSIS — N183 Chronic kidney disease, stage 3 unspecified: Secondary | ICD-10-CM

## 2019-08-17 DIAGNOSIS — E039 Hypothyroidism, unspecified: Secondary | ICD-10-CM

## 2019-08-17 DIAGNOSIS — I10 Essential (primary) hypertension: Secondary | ICD-10-CM

## 2019-08-17 DIAGNOSIS — Z794 Long term (current) use of insulin: Secondary | ICD-10-CM | POA: Insufficient documentation

## 2019-08-17 DIAGNOSIS — F32A Depression, unspecified: Secondary | ICD-10-CM

## 2019-08-17 DIAGNOSIS — G5601 Carpal tunnel syndrome, right upper limb: Secondary | ICD-10-CM

## 2019-08-17 HISTORY — DX: Vitamin D deficiency, unspecified: E55.9

## 2019-08-17 HISTORY — DX: Abnormal weight loss: R63.4

## 2019-08-17 MED ORDER — BUSPIRONE HCL 5 MG PO TABS: 5.0000 mg | ORAL_TABLET | Freq: Two times a day (BID) | ORAL | 3 refills | Status: DC

## 2019-08-17 NOTE — Chronic Care Management (AMB) (Signed)
Chronic Care Management   Follow Up Note   08/17/2019 Name: [name] MRN: [id number] DOB: 16-Sep-1950  Referred by: Aldine Contes, MD Reason for referral : Chronic Care Management (NIDDM, HTN, CKD, unintentional weight loss)   [name] is a 69 y.o. year old female who is a primary care patient of Aldine Contes, MD. The CCM team was consulted for assistance with chronic disease management and care coordination needs.    Review of patient status, including review of consultants reports, relevant laboratory and other test results, and collaboration with appropriate care team members and the patient's provider was performed as part of comprehensive patient evaluation and provision of chronic care management services.    SDOH (Social Determinants of Health) assessments performed: No See Care Plan activities for detailed interventions related to [facility])     Outpatient Encounter Medications as of 08/17/2019  Medication Sig Note   Accu-Chek Softclix Lancets lancets Check blood sugar 5 times a day as instructed    Acetaminophen 325 MG CAPS Take 325 mg by mouth every 6 (six) hours as needed (for pain).    ALPHAGAN P 0.1 % SOLN Place 1 drop into both eyes 3 (three) times daily. 04/17/2019: No record of this in past 6 months per external pharmacy records   Besifloxacin HCl (BESIVANCE) 0.6 % SUSP Besivance 0.6 % eye drops,suspension  INSTILL 1 DROP INTO THE LEFT EYE QID X 2 DAYS AFTER EACH MONTHLY EYE INJECTION 04/17/2019: No record of this in past 6 months per external pharmacy records   Blood Glucose Monitoring Suppl (ACCU-CHEK GUIDE ME) w/Device KIT Check blood sugar 5 times a day    busPIRone (BUSPAR) 5 MG tablet Take 1 tablet (5 mg total) by mouth 2 (two) times daily.    calcium citrate-vitamin D (CITRACAL+D) 315-200 MG-UNIT tablet Take 1 tablet by mouth 2 (two) times daily. (Patient not taking: Reported on 11/20/2017)    Cholecalciferol (VITAMIN D3) 2000 units TABS Take  2,000 Units at bedtime by mouth.     ciprofloxacin (CILOXAN) 0.3 % ophthalmic ointment  04/17/2019: No record of this in past 6 months per external pharmacy records   cycloSPORINE (RESTASIS) 0.05 % ophthalmic emulsion Restasis MultiDose 0.05 % eye drops  INT 1 GTT IN [facility] EYE BID 04/17/2019: No record of this in past 6 months per external pharmacy records   erythromycin ophthalmic ointment APPLY A SMALL AMOUNT INTO LEFT EYE TID 04/17/2019: No record of this in past 6 months per external pharmacy records   glucose 4 GM chewable tablet Chew 4 tablets (16 g total) by mouth as needed for low blood sugar. 04/17/2019: No record of this in past 6 months per external pharmacy records   glucose blood (ACCU-CHEK GUIDE) test strip Check blood sugar 5 times per day    Insulin Pen Needle (BD PEN NEEDLE NANO U/F) 32G X 4 MM MISC USE AS DIRECTED    ketorolac (ACULAR) 0.5 % ophthalmic solution Place 1 drop into both eyes 3 (three) times daily. 04/17/2019: No record of this in past 6 months per external pharmacy records   levothyroxine (SYNTHROID) 75 MCG tablet Take 1 tablet (75 mcg total) by mouth daily before breakfast.    loperamide (IMODIUM) 2 MG capsule Take 1 capsule (2 mg total) by mouth as needed for diarrhea or loose stools.    metFORMIN (GLUCOPHAGE) 500 MG tablet Take 2 tablets (1,000 mg total) by mouth 2 (two) times daily with a meal.    Nutritional Supplements (GLUCERNA SNACK SHAKE) LIQD Take  1 Dose by mouth 3 (three) times daily as needed. (Patient not taking: Reported on 05/26/2019) 05/26/2019: She is drinking vanilla ensure instead of Glucerna   Olopatadine HCl (PATADAY) 0.2 % SOLN Place 1 drop into both eyes daily as needed (dry eyes).  04/17/2019: No record of this in past 6 months per external pharmacy records   Polyethyl Glycol-Propyl Glycol (SYSTANE) 0.4-0.3 % GEL ophthalmic gel Place 1 application daily as needed into both eyes (dry eyes/irritation).     potassium chloride SA (KLOR-CON) 20  MEQ tablet Take 1 tablet (20 mEq total) by mouth daily for 3 days.    prednisoLONE acetate (PRED FORTE) 1 % ophthalmic suspension  04/17/2019: No record of this in past 6 months per external pharmacy records   rosuvastatin (CRESTOR) 20 MG tablet Take 1 tablet (20 mg total) by mouth daily.    Semaglutide,0.25 or 0.5MG/DOS, (OZEMPIC, 0.25 OR 0.5 MG/DOSE,) 2 MG/1.5ML SOPN Inject 0.25 mg into the skin once a week.    venlafaxine XR (EFFEXOR-XR) 150 MG 24 hr capsule TAKE 1 CAPSULE BY MOUTH DAILY WITH BREAKFAST    [DISCONTINUED] losartan (COZAAR) 25 MG tablet Take 12.5 mg by mouth daily. 05/26/2019: Her son [name] says he thinks this medication was started while patient was in [facility]- she has been out of it for a week so needs refill if she is to continue it   [DISCONTINUED] valACYclovir (VALTREX) 500 MG tablet TK 1 T PO QD 05/26/2019: She is out and needs refill if she is to continue this medication   No facility-administered encounter medications on file as of 08/17/2019.     Objective:  BP Readings from Last 3 Encounters:  08/17/19 (!) 89/55  07/21/19 116/80  06/30/19 135/74   Lab Results  Component Value Date   HGBA1C 7.7 (A) 07/21/2019   HGBA1C 14.4 (H) 04/18/2019   HGBA1C 9.0 (A) 12/08/2018   Lab Results  Component Value Date   MICROALBUR 1.0 12/09/2013   LDLCALC 154 (H) 10/13/2018   CREATININE 0.89 06/30/2019   Wt Readings from Last 3 Encounters:  08/17/19 108 lb 11.2 oz (49.3 kg)  07/21/19 109 lb 6.4 oz (49.6 kg)  06/30/19 113 lb 9.6 oz (51.5 kg)   Lab Results  Component Value Date   CHOL 228 (H) 10/13/2018   HDL 55 10/13/2018   LDLCALC 154 (H) 10/13/2018   TRIG 108 10/13/2018   CHOLHDL 4.1 10/13/2018    Goals Addressed              This Visit's Progress     Patient Stated     " I don't know why my blood sugar was so high when they took me to the hospital in March. I think I was taking my medications like I'm suppose to." (pt-stated)        CARE PLAN  ENTRY (see longitudinal plan of care for additional care plan information)  Current Barriers:   Chronic Disease Management support, education, and care coordination needs related to HTN, HLD, DMII, CKD Stage 3, and Depression- Clinical Goal(s) related to  HTN, HLD, DMII, CKD Stage 3, and Depression- met with patient and son [name] during clinic visit, she states he blood sugars are meeting target and she is off all blood pressure medicine with blood pressure at goal,  she says her weight has stabilized but she still has no appetite, she says she will drink Glucerna if this CCM RN can get samples and/or coupons for her,  she denies hypoglycemia or  symptoms of hypoglycemia, she says she is taking her medications as prescribed. She says she is still living in [facility] with her son [name]    Over the next 35 - 60 days, patient will:   Work with the care management team to address educational, disease management, and care coordination needs   Begin or continue self health monitoring activities as directed today Measure and record CBG (blood glucose) 2 times daily and take medications as prescribed  Call provider office for new or worsened signs and symptoms HTN, HLD, DMII, CKD Stage 3, and Depression  Call care management team with questions or concerns  Verbalize basic understanding of patient centered plan of care established today  Interventions related to HTN, HLD, DMII, CKD Stage 3, and Depression  Assessed patient's DM and HTN self management strategies and reviewed blood glucose home readings, assessed for hypoglycemic episodes  Advised patient, son and staff that she was approved for [employer] but not PCS  Reinforced importance of making follow up appointments with urology and clinic counselor with patient  Upon chart review after patient left clinic, it was noted that she was a "No Show" for the appointment with the neurologist on 7/13 and that she has not made follow up  appointments with [facility] clinic behavioral health counselor to assist with her depression   Patient Self Care Activities related to HTN, HLD, DMII, CKD Stage 3, and Depression Patient is unable to independently self-manage chronic health conditions  Please see past updates related to this goal by clicking on the "Past Updates" button in the selected goal        "I could use some help around the house when I get back home" (pt-stated)        Current Barriers:   Knowledge Barriers related to resources available for ADL IADL limitations Patient was assessed for [place] on 06/17/19.  Met with patient and her son [name] during patient's clinic visit on 08/17/19.   Case Manager Clinical Goal(s):   Over the next 60 days, patient will work with BSW to address needs related to ADL IADL limitations; obtaining [place]   Over the next 60 days, BSW will collaborate with RN Care Manager to address care management and care coordination needs  Interventions:   Advised patient her son [name], Dr Dareen Piano and clinic staff [facility] that patient was approved for [employer], formally SCAT, that she was not approved for personal care services. Also advised  patient and son that patient should have received Determination Letter including instructions for appeal of decision or reassessment if interested.   Provided phone number for [facility] and explained services.  Assessed status of dm, and HTN and weight and reviewed clinic visit notes.    After discussion with patient and son and verifying patient does drink Glucerna supplement,  contacted [employer] representative Hawyee Dawe via e-mail requesting Glucerna coupons and samples of Vanilla Glucerna for patient.   Gave patient and son business cards for this CCM RN and CCM BSW [employer].    Patient Self Care Activities:   Patient verbalizes understanding of plan to work with BSW to obtain  [place]   Self administers medications as prescribed  Attends all scheduled provider appointments  Please see past updates related to this goal by clicking on the "Past Updates" button in the selected goal          Plan:   The care management team will reach  out to the patient again over the next 30-60 days.    Kelli Churn RN, CCM, [facility] RN Care Manager [phone]

## 2019-08-17 NOTE — Assessment & Plan Note (Signed)
-  This problem is chronic and stable -Patient has been off medications and her blood pressure is at goal -She states that she still has some episodes of lightheadedness when she stands up and ambulates. -Orthostatic vitals taken today and patient was not orthostatic -We will continue to monitor closely.

## 2019-08-17 NOTE — Assessment & Plan Note (Signed)
-  This problem is chronic and stable -Patient is compliant with rosuvastatin 20 mg daily -No further work-up at this time

## 2019-08-17 NOTE — Patient Instructions (Addendum)
-  It was a pleasure seeing you today -I have refilled BuSpar for you.  If you have any issues obtaining this medication please let me know -We will check your thyroid function, vitamin B12 and vitamin D levels today -We will check an EKG on you today -Please increase your Glucerna to 3 times a day -Your blood pressure is well controlled. -Your weight has remained stable.  We will continue to monitor this closely -No further work-up at this time.

## 2019-08-17 NOTE — Patient Instructions (Signed)
Visit Information It was good to see you today. I will call you when I hear back from the [employer]al Rep about the request for Glucerna coupons and/or samples.  Goals Addressed              This Visit's Progress     Patient Stated   .  " I don't know why my blood sugar was so high when they took me to the hospital in March. I think I was taking my medications like I'm suppose to." (pt-stated)        CARE PLAN ENTRY (see longitudinal plan of care for additional care plan information)  Current Barriers:  . Chronic Disease Management support, education, and care coordination needs related to HTN, HLD, DMII, CKD Stage 3, and Depression- Clinical Goal(s) related to  HTN, HLD, DMII, CKD Stage 3, and Depression- met with patient and son [name] during clinic visit, she states he blood sugars are meeting target and she is off all blood pressure medicine with blood pressure at goal,  she says her weight has stabilized but she still has no appetite, she says she will drink Glucerna if this CCM RN can get samples and/or coupons for her,  she denies hypoglycemia or symptoms of hypoglycemia, she says she is taking her medications as prescribed. She says she is still living in [place] with her son [name]  .  Over the next 30 - 60 days, patient will:  . Work with the care management team to address educational, disease management, and care coordination needs  . Begin or continue self health monitoring activities as directed today Measure and record CBG (blood glucose) 2 times daily and take medications as prescribed . Call provider office for new or worsened signs and symptoms HTN, HLD, DMII, CKD Stage 3, and Depression . Call care management team with questions or concerns . Verbalize basic understanding of patient centered plan of care established today  Interventions related to HTN, HLD, DMII, CKD Stage 3, and Depression . Assessed patient's DM and HTN self management strategies and reviewed  blood glucose home readings, assessed for hypoglycemic episodes . Advised patient, son and staff that she was approved for [employer] but not [employer] . Reinforced importance of making follow up appointments with urology and clinic counselor with patient . Upon chart review after patient left clinic, it was noted that she was a "No Show" for the appointment with the neurologist on 7/13 and that she has not made follow up appointments with [facility] clinic behavioral health counselor to assist with her depression   Patient Self Care Activities related to HTN, HLD, DMII, CKD Stage 3, and Depression Patient is unable to independently self-manage chronic health conditions  Please see past updates related to this goal by clicking on the "Past Updates" button in the selected goal      .  COMPLETED: "I could use some help around the house when I get back home" (pt-stated)        Current Barriers:  [name] Knowledge Barriers related to resources available for ADL IADL limitations Patient was assessed for [place] on 06/17/19.  Met with patient and her son [name] during patient's clinic visit on 08/17/19.  . Case Manager Clinical Goal(s):  [name] Over the next 60 days, patient will work with BSW to address needs related to ADL IADL limitations; obtaining [place]  . Over the next 60 days, BSW will collaborate with RN Care Manager to address care  management and care coordination needs  Interventions:  . Advised patient her son [name], Dr Dareen Piano and clinic staff [facility] that patient was approved for [employer], formally [facility], that she was not approved for personal care services. Also advised  patient and son that patient should have received Determination Letter including instructions for appeal of decision or reassessment if interested.  . Provided phone number for [facility] and explained services. . Assessed status of dm, and HTN and weight and reviewed clinic visit  notes.   . After discussion with patient and son and verifying patient does drink Glucerna supplement,  contacted [employer] representative Hawyee Dawe via e-mail requesting Glucerna coupons and samples of Vanilla Glucerna for patient.  Hanley Seamen patient and son business cards for this CCM RN and [facility].   . Patient Self Care Activities:  . Patient verbalizes understanding of plan to work with BSW to obtain [employer]  . Self administers medications as prescribed . Attends all scheduled provider appointments  Please see past updates related to this goal by clicking on the "Past Updates" button in the selected goal         The patient verbalized understanding of instructions provided today and declined a print copy of patient instruction materials.   The care management team will reach out to the patient again over the next 30-60 days.   Kelli Churn RN, CCM, [facility] RN Care Manager [phone]

## 2019-08-17 NOTE — Assessment & Plan Note (Signed)
-  This problem is chronic and stable -Patient is currently on Ozempic 0.25 mg weekly and Metformin 1000 mg twice daily -She does complain of some days of increased bowel movements for which he takes loperamide which controls this.  Most days she states that she has 1 or 2 bowel movements a day. -We will continue with current regimen for now. -Patient has had weight loss of approximately 15 pounds over the last 6 months.  Patient's weight has remained stable from her last visit 1 month ago. -I suspect the weight loss may be secondary to her Ozempic.  Would consider stopping the Ozempic and transitioning her to an SGLT2 inhibitor to see if this would help with her weight loss if she continues to lose weight.

## 2019-08-17 NOTE — Assessment & Plan Note (Signed)
-  Patient has lost approximately 15 pounds over the last few months but her weight has been stable since her last visit last month -I suspect this is secondary to her Ozempic.  If patient continues to lose weight would consider transitioning her off Ozempic to SGLT2 inhibitor -If patient continues to lose weight after being off of Ozempic would consider further work-up for weight loss

## 2019-08-17 NOTE — Assessment & Plan Note (Addendum)
>>  ASSESSMENT AND PLAN FOR CARPAL TUNNEL SYNDROME OF RIGHT WRIST WRITTEN ON 08/19/2019  9:12 AM BY Afshin Chrystal, MD  -This problem is chronic and stable -Patient states that her tingling numbness have improved tremendously.  She has a negative Tinel's and Phalen's sign today on exam. -She denies any weakness currently in her hands. -We will check a vitamin B12 level today as a possible etiology for her neuropathy but I suspect that her symptoms may have been secondary to her uncontrolled blood sugars which have now improved  Addendum: -Patient was noted to have a normal vitamin B12 level.  I called the patient to discuss results with her.  No further work-up at this time.  She expresses understanding and is in agreement with plan.  >>ASSESSMENT AND PLAN FOR CARPAL TUNNEL SYNDROME WRITTEN ON 08/17/2019 11:06 AM BY Rahaf Carbonell, MD  -Patient history of right middle finger trigger finger and underwent surgery for this.  Patient states that she also underwent carpal tunnel surgery at this time for the right hand -Her tingling numbness have improved and she does not have any weakness currently -We will hold off on further work-up at this time and continue to monitor closely. -No further work-up at this time

## 2019-08-17 NOTE — Assessment & Plan Note (Signed)
-  Patient history of right middle finger trigger finger and underwent surgery for this.  Patient states that she also underwent carpal tunnel surgery at this time for the right hand -Her tingling numbness have improved and she does not have any weakness currently -We will hold off on further work-up at this time and continue to monitor closely. -No further work-up at this time

## 2019-08-17 NOTE — Assessment & Plan Note (Signed)
-  This problem is chronic and stable -Patient did have a BMP done last month which showed a creatinine of 0.9 which is at her baseline. -No further work-up at this time.  We will continue to monitor closely.

## 2019-08-17 NOTE — Progress Notes (Signed)
   Subjective:    Patient ID: [name], female    DOB: November 30, 1950, 69 y.o.   MRN: [id number]  HPI  I have seen and examined this patient.  Patient is here for routine follow-up of her diabetes and hypothyroidism.  Patient states that she feels well currently.  Patient son is with her and states that patient has lost a lot of weight over the last couple of years but that her weight is stable since her last visit.  Patient states that food has not been tasting as good and that the meat tastes like medicine.  She is eating pasta and vegetables.  She denies any other complaints at this time and overall feels well.  Patient is compliant with all her medications.  Review of Systems  Constitutional: Negative.   HENT: Negative.   Respiratory: Negative.   Cardiovascular: Negative.   Gastrointestinal: Negative.   Musculoskeletal: Negative.   Neurological: Negative.        Objective:   Physical Exam HENT:     Head: Normocephalic and atraumatic.  Cardiovascular:     Rate and Rhythm: Normal rate and regular rhythm.     Heart sounds: Normal heart sounds.  Pulmonary:     Effort: Pulmonary effort is normal.     Breath sounds: Normal breath sounds. No wheezing or rales.  Abdominal:     General: Bowel sounds are normal. There is no distension.     Palpations: Abdomen is soft.     Tenderness: There is no abdominal tenderness.  Musculoskeletal:        General: No swelling or tenderness.  Neurological:     Mental Status: She is alert and oriented to person, place, and time.  Psychiatric:        Mood and Affect: Mood normal.        Behavior: Behavior normal.           Assessment & Plan:  Please see problem based charting for assessment and plan:

## 2019-08-17 NOTE — Assessment & Plan Note (Addendum)
-  This problem is chronic and stable -Patient was noted to have a low TSH on June 2 and was transitioned to Synthroid 75 mcg daily from 88 mcg. -We will recheck her TSH today. -No further work-up at this time.  Addendum: -Patient was noted to have a low TSH on blood work (mildly improved from last time). -I called the patient and discussed the results with her.  Patient denies taking any over-the-counter supplements except for vitamin D3. -We will decrease her dose of Synthroid to 50 mcg and have her repeat blood work in 4 to 6 weeks

## 2019-08-17 NOTE — Progress Notes (Signed)
Internal Medicine Clinic Resident  I have personally reviewed this encounter including the documentation in this note and/or discussed this patient with the care management provider. I will address any urgent items identified by the care management provider and will communicate my actions to the patient's PCP. I have reviewed the patient's CCM visit with my supervising attending, Dr Dareen Piano.  Delice Bison, DO 08/17/2019

## 2019-08-17 NOTE — Assessment & Plan Note (Signed)
-  Patient has a history of depression is currently on Effexor and BuSpar. -Patient states that she is doing well on her medications currently -We will refill her BuSpar for her today -We will follow PHQ-9 score from today.  Patient states that she is following up with Ms. Ashton. -No further work-up at this time

## 2019-08-17 NOTE — Assessment & Plan Note (Addendum)
-  Patient has a history of vitamin D deficiency and is on vitamin D supplementation at home -We will check a vitamin D level today.  Her last vitamin D level was 25 in 2010  Addendum: -Patient's 25 hydroxy vitamin D level was within normal limits -No further work-up at this time.  Results were discussed with patient via phone

## 2019-08-18 DIAGNOSIS — Z9011 Acquired absence of right breast and nipple: Secondary | ICD-10-CM | POA: Diagnosis not present

## 2019-08-18 DIAGNOSIS — E1122 Type 2 diabetes mellitus with diabetic chronic kidney disease: Secondary | ICD-10-CM | POA: Diagnosis not present

## 2019-08-18 DIAGNOSIS — E1142 Type 2 diabetes mellitus with diabetic polyneuropathy: Secondary | ICD-10-CM | POA: Diagnosis not present

## 2019-08-18 DIAGNOSIS — Z9181 History of falling: Secondary | ICD-10-CM | POA: Diagnosis not present

## 2019-08-18 DIAGNOSIS — H3530 Unspecified macular degeneration: Secondary | ICD-10-CM | POA: Diagnosis not present

## 2019-08-18 DIAGNOSIS — I972 Postmastectomy lymphedema syndrome: Secondary | ICD-10-CM | POA: Diagnosis not present

## 2019-08-18 DIAGNOSIS — E1165 Type 2 diabetes mellitus with hyperglycemia: Secondary | ICD-10-CM | POA: Diagnosis not present

## 2019-08-18 DIAGNOSIS — N189 Chronic kidney disease, unspecified: Secondary | ICD-10-CM | POA: Diagnosis not present

## 2019-08-18 DIAGNOSIS — H409 Unspecified glaucoma: Secondary | ICD-10-CM | POA: Diagnosis not present

## 2019-08-18 DIAGNOSIS — M1990 Unspecified osteoarthritis, unspecified site: Secondary | ICD-10-CM | POA: Diagnosis not present

## 2019-08-18 DIAGNOSIS — R1312 Dysphagia, oropharyngeal phase: Secondary | ICD-10-CM | POA: Diagnosis not present

## 2019-08-18 DIAGNOSIS — G9340 Encephalopathy, unspecified: Secondary | ICD-10-CM | POA: Diagnosis not present

## 2019-08-18 DIAGNOSIS — Z794 Long term (current) use of insulin: Secondary | ICD-10-CM | POA: Diagnosis not present

## 2019-08-18 DIAGNOSIS — Z79899 Other long term (current) drug therapy: Secondary | ICD-10-CM | POA: Diagnosis not present

## 2019-08-18 DIAGNOSIS — I129 Hypertensive chronic kidney disease with stage 1 through stage 4 chronic kidney disease, or unspecified chronic kidney disease: Secondary | ICD-10-CM | POA: Diagnosis not present

## 2019-08-18 DIAGNOSIS — E039 Hypothyroidism, unspecified: Secondary | ICD-10-CM | POA: Diagnosis not present

## 2019-08-18 LAB — VITAMIN D 25 HYDROXY (VIT D DEFICIENCY, FRACTURES): Vit D, 25-Hydroxy: 65.7 ng/mL (ref 30.0–100.0)

## 2019-08-18 LAB — TSH: TSH: 0.064 u[IU]/mL — ABNORMAL LOW (ref 0.450–4.500)

## 2019-08-18 LAB — VITAMIN B12: Vitamin B-12: 410 pg/mL (ref 232–1245)

## 2019-08-18 NOTE — Progress Notes (Signed)
Internal Medicine Clinic Attending  CCM services provided by the care management provider and their documentation were discussed with Dr. Bloomfield. We reviewed the pertinent findings, urgent action items addressed by the resident and non-urgent items to be addressed by the PCP.  I agree with the assessment, diagnosis, and plan of care documented in the CCM and resident's note.  Kenn Rekowski, MD 08/18/2019  

## 2019-08-19 ENCOUNTER — Other Ambulatory Visit: Payer: Self-pay | Admitting: Internal Medicine

## 2019-08-19 DIAGNOSIS — E039 Hypothyroidism, unspecified: Secondary | ICD-10-CM

## 2019-08-19 MED ORDER — LEVOTHYROXINE SODIUM 50 MCG PO TABS: 50.0000 ug | ORAL_TABLET | Freq: Every day | ORAL | 0 refills | Status: DC

## 2019-08-19 NOTE — Addendum Note (Signed)
Addended by: Aldine Contes on: 08/19/2019 09:13 AM   Modules accepted: Orders

## 2019-08-19 NOTE — Addendum Note (Signed)
Addended by: Hulan Fray on: 08/19/2019 05:47 PM   Modules accepted: Orders

## 2019-08-24 DIAGNOSIS — E1165 Type 2 diabetes mellitus with hyperglycemia: Secondary | ICD-10-CM | POA: Diagnosis not present

## 2019-08-24 DIAGNOSIS — Z9011 Acquired absence of right breast and nipple: Secondary | ICD-10-CM | POA: Diagnosis not present

## 2019-08-24 DIAGNOSIS — G9340 Encephalopathy, unspecified: Secondary | ICD-10-CM | POA: Diagnosis not present

## 2019-08-24 DIAGNOSIS — Z9181 History of falling: Secondary | ICD-10-CM | POA: Diagnosis not present

## 2019-08-24 DIAGNOSIS — E1122 Type 2 diabetes mellitus with diabetic chronic kidney disease: Secondary | ICD-10-CM | POA: Diagnosis not present

## 2019-08-24 DIAGNOSIS — I129 Hypertensive chronic kidney disease with stage 1 through stage 4 chronic kidney disease, or unspecified chronic kidney disease: Secondary | ICD-10-CM | POA: Diagnosis not present

## 2019-08-24 DIAGNOSIS — I972 Postmastectomy lymphedema syndrome: Secondary | ICD-10-CM | POA: Diagnosis not present

## 2019-08-24 DIAGNOSIS — H3530 Unspecified macular degeneration: Secondary | ICD-10-CM | POA: Diagnosis not present

## 2019-08-24 DIAGNOSIS — R1312 Dysphagia, oropharyngeal phase: Secondary | ICD-10-CM | POA: Diagnosis not present

## 2019-08-24 DIAGNOSIS — E1142 Type 2 diabetes mellitus with diabetic polyneuropathy: Secondary | ICD-10-CM | POA: Diagnosis not present

## 2019-08-24 DIAGNOSIS — H409 Unspecified glaucoma: Secondary | ICD-10-CM | POA: Diagnosis not present

## 2019-08-24 DIAGNOSIS — Z794 Long term (current) use of insulin: Secondary | ICD-10-CM | POA: Diagnosis not present

## 2019-08-24 DIAGNOSIS — M1990 Unspecified osteoarthritis, unspecified site: Secondary | ICD-10-CM | POA: Diagnosis not present

## 2019-08-24 DIAGNOSIS — E039 Hypothyroidism, unspecified: Secondary | ICD-10-CM | POA: Diagnosis not present

## 2019-08-24 DIAGNOSIS — Z79899 Other long term (current) drug therapy: Secondary | ICD-10-CM | POA: Diagnosis not present

## 2019-08-24 DIAGNOSIS — N189 Chronic kidney disease, unspecified: Secondary | ICD-10-CM | POA: Diagnosis not present

## 2019-09-06 DIAGNOSIS — Z794 Long term (current) use of insulin: Secondary | ICD-10-CM | POA: Diagnosis not present

## 2019-09-06 DIAGNOSIS — E118 Type 2 diabetes mellitus with unspecified complications: Secondary | ICD-10-CM | POA: Diagnosis not present

## 2019-09-08 ENCOUNTER — Other Ambulatory Visit: Payer: Self-pay

## 2019-09-08 ENCOUNTER — Encounter: Payer: Self-pay | Admitting: Podiatry

## 2019-09-08 ENCOUNTER — Ambulatory Visit (INDEPENDENT_AMBULATORY_CARE_PROVIDER_SITE_OTHER): Payer: Medicare Other | Admitting: Podiatry

## 2019-09-08 DIAGNOSIS — B351 Tinea unguium: Secondary | ICD-10-CM | POA: Diagnosis not present

## 2019-09-08 DIAGNOSIS — M2042 Other hammer toe(s) (acquired), left foot: Secondary | ICD-10-CM | POA: Diagnosis not present

## 2019-09-08 DIAGNOSIS — M2010 Hallux valgus (acquired), unspecified foot: Secondary | ICD-10-CM

## 2019-09-08 DIAGNOSIS — M2041 Other hammer toe(s) (acquired), right foot: Secondary | ICD-10-CM

## 2019-09-08 DIAGNOSIS — L84 Corns and callosities: Secondary | ICD-10-CM | POA: Diagnosis not present

## 2019-09-08 DIAGNOSIS — E1149 Type 2 diabetes mellitus with other diabetic neurological complication: Secondary | ICD-10-CM | POA: Diagnosis not present

## 2019-09-08 NOTE — Progress Notes (Signed)
This patient returns to my office for at risk foot care.  This patient requires this care by a professional since this patient will be at risk due to having diabetes and chronic kidney disease.    This patient is unable to cut nails herself since the patient cannot reach her nails.These nails are painful walking and wearing shoes.   Patient has callus under her big toe joint left foot. This patient presents for at risk foot care today.  General Appearance  Alert, conversant and in no acute stress.  Vascular  Dorsalis pedis and posterior tibial  pulses are palpable  bilaterally.  Capillary return is within normal limits  bilaterally. Temperature is within normal limits  bilaterally.  Neurologic  Senn-Weinstein monofilament wire test diminished  bilaterally. Muscle power within normal limits bilaterally.  Nails Thick disfigured discolored nails with subungual debris  from hallux to fifth toes bilaterally. No evidence of bacterial infection or drainage bilaterally.  Orthopedic  No limitations of motion  feet .  No crepitus or effusions noted.  No bony pathology or digital deformities noted.  Skin  normotropic skin with no porokeratosis noted bilaterally.  No signs of infections or ulcers noted.  Callus sub 1st MPJ left foot.   Onychomycosis  Pain in right toes  Pain in left toes  Porokeratosis left forefoot.  Consent was obtained for treatment procedures.   Mechanical debridement of nails 1-5  bilaterally performed with a nail nipper.  Filed with dremel without incident. Debridement of callus with # 15 blade.   Return office visit  3 months                    Told patient to return for periodic foot care and evaluation due to potential at risk complications.   Cevin Rubinstein DPM  

## 2019-09-17 ENCOUNTER — Ambulatory Visit: Payer: Medicare Other | Admitting: *Deleted

## 2019-09-17 DIAGNOSIS — F329 Major depressive disorder, single episode, unspecified: Secondary | ICD-10-CM

## 2019-09-17 DIAGNOSIS — E1142 Type 2 diabetes mellitus with diabetic polyneuropathy: Secondary | ICD-10-CM

## 2019-09-17 DIAGNOSIS — E039 Hypothyroidism, unspecified: Secondary | ICD-10-CM

## 2019-09-17 DIAGNOSIS — F32A Depression, unspecified: Secondary | ICD-10-CM

## 2019-09-17 DIAGNOSIS — N183 Chronic kidney disease, stage 3 unspecified: Secondary | ICD-10-CM

## 2019-09-17 DIAGNOSIS — I10 Essential (primary) hypertension: Secondary | ICD-10-CM

## 2019-09-17 NOTE — Patient Instructions (Signed)
Visit Information It was nice speaking with you today. I will ask you provider to refill your antidepressant. Goals Addressed              This Visit's Progress     Patient Stated   .  " I don't know why my blood sugar was so high when they took me to the hospital in March. I think I was taking my medications like I'm suppose to." (pt-stated)        CARE PLAN ENTRY (see longitudinal plan of care for additional care plan information)  Current Barriers:  . Chronic Disease Management support, education, and care coordination needs related to HTN, HLD, DMII, CKD Stage 3, and Depression- Clinical Goal(s) related to  HTN, HLD, DMII, CKD Stage 3, and Depression- spoke with patient , she states she is "doing OK", still residing with her son [name] in [place], reports blood sugars "121 or less' , denies hypoglycemia, she remains  off all blood pressure medicine with blood pressure at goal,  she says her weight has stabilized but she still has no appetite, she says she is interested in will Glucerna samples and/or coupons for her,  she says she is taking her medications as prescribed and says she needs refills on her antidepressants .  Over the next 30 - 60 days, patient will:  . Work with the care management team to address educational, disease management, and care coordination needs  . Begin or continue self health monitoring activities as directed today Measure and record CBG (blood glucose) 2 times daily and take medications as prescribed . Call provider office for new or worsened signs and symptoms HTN, HLD, DMII, CKD Stage 3, and Depression . Call care management team with questions or concerns . Verbalize basic understanding of patient centered plan of care established today  Interventions related to HTN, HLD, DMII, CKD Stage 3, and Depression . Prior to interviewing patient, reviewed patient status, including review of recent office visit notes, consultants reports, relevant laboratory and  other test results, and medications, completed. . Assessed patient's DM and HTN self management strategies and reviewed blood glucose home readings, assessed for hypoglycemic episodes . Assessed appetite and weight stability . Informed patient this CCM RN enrolled in the [facility] and have requested samples of Glucerna and she will be provided with samples when they arrive . Reviewed medications with patient and discussed medication taking behavior . Message provider about need for refills on antidepressants . Discussed plans with patient for ongoing care management follow up and provided patient with direct contact information for care management team . Reviewed scheduled/upcoming provider appointments including:clinic appointment on 10/19/19  Patient Self Care Activities related to HTN, HLD, DMII, CKD Stage 3, and Depression Patient is unable to independently self-manage chronic health conditions  Please see past updates related to this goal by clicking on the "Past Updates" button in the selected goal         The patient verbalized understanding of instructions provided today and declined a print copy of patient instruction materials.   The care management team will reach out to the patient again over the next 30-60 days.   Kelli Churn RN, CCM, [facility] RN Care Manager [phone]

## 2019-09-17 NOTE — Chronic Care Management (AMB) (Signed)
Chronic Care Management   Follow Up Note   09/17/2019 Name: Marilu Rylander MRN: [id number] DOB: [birth year]  Referred by: Aldine Contes, MD Reason for referral : Chronic Care Management (NIDDM, HTN, HLD, CKD, depression)   Celest Reitz is a 69 y.o. year old female who is a primary care patient of Aldine Contes, MD. The CCM team was consulted for assistance with chronic disease management and care coordination needs.    Review of patient status, including review of consultants reports, relevant laboratory and other test results, and collaboration with appropriate care team members and the patient's provider was performed as part of comprehensive patient evaluation and provision of chronic care management services.    SDOH (Social Determinants of Health) assessments performed: No See Care Plan activities for detailed interventions related to [facility])     Outpatient Encounter Medications as of 09/17/2019  Medication Sig Note  . Accu-Chek Softclix Lancets lancets Check blood sugar 5 times a day as instructed   . Acetaminophen 325 MG CAPS Take 325 mg by mouth every 6 (six) hours as needed (for pain).   . ALPHAGAN P 0.1 % SOLN Place 1 drop into both eyes 3 (three) times daily. 04/17/2019: No record of this in past 6 months per external pharmacy records  . Besifloxacin HCl (BESIVANCE) 0.6 % SUSP Besivance 0.6 % eye drops,suspension  INSTILL 1 DROP INTO THE LEFT EYE QID X 2 DAYS AFTER EACH MONTHLY EYE INJECTION 04/17/2019: No record of this in past 6 months per external pharmacy records  . Blood Glucose Monitoring Suppl (ACCU-CHEK GUIDE ME) w/Device KIT Check blood sugar 5 times a day   . busPIRone (BUSPAR) 5 MG tablet Take 1 tablet (5 mg total) by mouth 2 (two) times daily.   . calcium citrate-vitamin D (CITRACAL+D) 315-200 MG-UNIT tablet Take 1 tablet by mouth 2 (two) times daily. (Patient not taking: Reported on 11/20/2017)   . Cholecalciferol (VITAMIN D3) 2000 units TABS Take 2,000 Units at  bedtime by mouth.    . ciprofloxacin (CILOXAN) 0.3 % ophthalmic ointment  04/17/2019: No record of this in past 6 months per external pharmacy records  . cycloSPORINE (RESTASIS) 0.05 % ophthalmic emulsion Restasis MultiDose 0.05 % eye drops  INT 1 GTT IN [facility] EYE BID 04/17/2019: No record of this in past 6 months per external pharmacy records  . erythromycin ophthalmic ointment APPLY A SMALL AMOUNT INTO LEFT EYE TID 04/17/2019: No record of this in past 6 months per external pharmacy records  . glucose 4 GM chewable tablet Chew 4 tablets (16 g total) by mouth as needed for low blood sugar. 04/17/2019: No record of this in past 6 months per external pharmacy records  . glucose blood (ACCU-CHEK GUIDE) test strip Check blood sugar 5 times per day   . glucose blood test strip OneTouch Verio test strips  USE TO CHECK BS BID   . Insulin Pen Needle (BD PEN NEEDLE NANO U/F) 32G X 4 MM MISC USE AS DIRECTED   . ketorolac (ACULAR) 0.5 % ophthalmic solution Place 1 drop into both eyes 3 (three) times daily. 04/17/2019: No record of this in past 6 months per external pharmacy records  . levothyroxine (SYNTHROID) 88 MCG tablet Take 88 mcg by mouth daily.   [name] loperamide (IMODIUM) 2 MG capsule Take 1 capsule (2 mg total) by mouth as needed for diarrhea or loose stools.   . metFORMIN (GLUCOPHAGE) 500 MG tablet Take 2 tablets (1,000 mg total) by mouth 2 (two) times daily with a  meal.   . Nutritional Supplements (GLUCERNA SNACK SHAKE) LIQD Take 1 Dose by mouth 3 (three) times daily as needed. 05/26/2019: She is drinking vanilla ensure instead of Glucerna  . Olopatadine HCl (PATADAY) 0.2 % SOLN Place 1 drop into both eyes daily as needed (dry eyes).  04/17/2019: No record of this in past 6 months per external pharmacy records  . Polyethyl Glycol-Propyl Glycol (SYSTANE) 0.4-0.3 % GEL ophthalmic gel Place 1 application daily as needed into both eyes (dry eyes/irritation).    . potassium chloride SA (KLOR-CON) 20 MEQ tablet Take  1 tablet (20 mEq total) by mouth daily for 3 days.   . prednisoLONE acetate (PRED FORTE) 1 % ophthalmic suspension  04/17/2019: No record of this in past 6 months per external pharmacy records  . rosuvastatin (CRESTOR) 20 MG tablet Take 1 tablet (20 mg total) by mouth daily.   . Semaglutide,0.25 or 0.5MG/DOS, (OZEMPIC, 0.25 OR 0.5 MG/DOSE,) 2 MG/1.5ML SOPN Inject 0.25 mg into the skin once a week.   . venlafaxine XR (EFFEXOR-XR) 150 MG 24 hr capsule TAKE 1 CAPSULE BY MOUTH DAILY WITH BREAKFAST Needs refill   No facility-administered encounter medications on file as of 09/17/2019.     Objective:   Goals Addressed              This Visit's Progress     Patient Stated   .  " I don't know why my blood sugar was so high when they took me to the hospital in March. I think I was taking my medications like I'm suppose to." (pt-stated)        CARE PLAN ENTRY (see longitudinal plan of care for additional care plan information)  Current Barriers:  . Chronic Disease Management support, education, and care coordination needs related to HTN, HLD, DMII, CKD Stage 3, and Depression- Clinical Goal(s) related to  HTN, HLD, DMII, CKD Stage 3, and Depression- spoke with patient , she states she is "doing OK", still residing with her son [name] in [place], reports blood sugars "121 or less' , denies hypoglycemia, she remains  off all blood pressure medicine with blood pressure at goal,  she says her weight has stabilized but she still has no appetite, she says she is interested in will Glucerna samples and/or coupons for her,  she says she is taking her medications as prescribed and says she needs refills on her antidepressants .  Over the next 30 - 60 days, patient will:  . Work with the care management team to address educational, disease management, and care coordination needs  . Begin or continue self health monitoring activities as directed today Measure and record CBG (blood glucose) 2 times daily and  take medications as prescribed . Call provider office for new or worsened signs and symptoms HTN, HLD, DMII, CKD Stage 3, and Depression . Call care management team with questions or concerns . Verbalize basic understanding of patient centered plan of care established today  Interventions related to HTN, HLD, DMII, CKD Stage 3, and Depression . Prior to interviewing patient, reviewed patient status, including review of recent office visit notes, consultants reports, relevant laboratory and other test results, and medications, completed. . Assessed patient's DM and HTN self management strategies and reviewed blood glucose home readings, assessed for hypoglycemic episodes . Assessed appetite and weight stability . Informed patient this CCM RN enrolled in the [place] and have requested samples of Glucerna and she will be provided with samples when they  arrive . Reviewed medications with patient and discussed medication taking behavior . Message provider about need for refills on antidepressants . Discussed plans with patient for ongoing care management follow up and provided patient with direct contact information for care management team . Reviewed scheduled/upcoming provider appointments including:clinic appointment on 10/19/19  Patient Self Care Activities related to HTN, HLD, DMII, CKD Stage 3, and Depression Patient is unable to independently self-manage chronic health conditions  Please see past updates related to this goal by clicking on the "Past Updates" button in the selected goal          Plan:   The care management team will reach out to the patient again over the next 30-60 days.    Kelli Churn RN, CCM, [facility] RN Care Manager [phone]

## 2019-09-23 ENCOUNTER — Other Ambulatory Visit: Payer: Self-pay | Admitting: Internal Medicine

## 2019-09-23 DIAGNOSIS — F32A Depression, unspecified: Secondary | ICD-10-CM

## 2019-09-23 MED ORDER — VENLAFAXINE HCL ER 150 MG PO CP24: ORAL_CAPSULE | ORAL | 0 refills | Status: DC

## 2019-09-23 NOTE — Progress Notes (Signed)
Internal Medicine Clinic Resident  I have personally reviewed this encounter including the documentation in this note and/or discussed this patient with the care management provider. I will address any urgent items identified by the care management provider and will communicate my actions to the patient's PCP. I have reviewed the patient's CCM visit with my supervising attending, Dr Vincent.  Raniyah Curenton, MD 09/23/2019    

## 2019-09-23 NOTE — Progress Notes (Signed)
Internal Medicine Clinic Attending  CCM services provided by the care management provider and their documentation were discussed with Dr. Basaraba. We reviewed the pertinent findings, urgent action items addressed by the resident and non-urgent items to be addressed by the PCP.  I agree with the assessment, diagnosis, and plan of care documented in the CCM and resident's note.  Joshlynn Alfonzo Thomas Najma Bozarth, MD 09/23/2019  

## 2019-09-30 NOTE — Telephone Encounter (Signed)
Chart review.

## 2019-10-05 ENCOUNTER — Telehealth: Payer: Self-pay | Admitting: Internal Medicine

## 2019-10-05 DIAGNOSIS — E039 Hypothyroidism, unspecified: Secondary | ICD-10-CM

## 2019-10-05 MED ORDER — LEVOTHYROXINE SODIUM 50 MCG PO TABS: 50.0000 ug | ORAL_TABLET | Freq: Every day | ORAL | 1 refills | Status: DC

## 2019-10-05 NOTE — Telephone Encounter (Signed)
Need refill on levothyroxine (SYNTHROID) 88 MCG tablet  ;pt contact [place], [place] [facility] [place]

## 2019-10-08 NOTE — Telephone Encounter (Signed)
Returned call to patient. States she had continued to take levothyroxine 75 mcg daily not realizing PCP had decreased dose to 50 mcg. She will start 50 mcg tab today. Has f/u appt with PCP on 10/19/2019. Hubbard Hartshorn, BSN, RN-BC

## 2019-10-08 NOTE — Telephone Encounter (Signed)
Thank you :)

## 2019-10-08 NOTE — Telephone Encounter (Signed)
Pls contact pt regarding dosage [phone]

## 2019-10-18 ENCOUNTER — Telehealth: Payer: Medicare Other

## 2019-10-19 ENCOUNTER — Other Ambulatory Visit: Payer: Self-pay

## 2019-10-19 ENCOUNTER — Ambulatory Visit (INDEPENDENT_AMBULATORY_CARE_PROVIDER_SITE_OTHER): Payer: Medicare Other | Admitting: Internal Medicine

## 2019-10-19 ENCOUNTER — Encounter: Payer: Self-pay | Admitting: Internal Medicine

## 2019-10-19 VITALS — BP 128/77 | HR 99 | Temp 98.5°F | Wt 112.7 lb

## 2019-10-19 DIAGNOSIS — Z0000 Encounter for general adult medical examination without abnormal findings: Secondary | ICD-10-CM

## 2019-10-19 DIAGNOSIS — G629 Polyneuropathy, unspecified: Secondary | ICD-10-CM

## 2019-10-19 DIAGNOSIS — F329 Major depressive disorder, single episode, unspecified: Secondary | ICD-10-CM

## 2019-10-19 DIAGNOSIS — Z23 Encounter for immunization: Secondary | ICD-10-CM | POA: Diagnosis not present

## 2019-10-19 DIAGNOSIS — N1330 Unspecified hydronephrosis: Secondary | ICD-10-CM

## 2019-10-19 DIAGNOSIS — Z794 Long term (current) use of insulin: Secondary | ICD-10-CM

## 2019-10-19 DIAGNOSIS — E039 Hypothyroidism, unspecified: Secondary | ICD-10-CM

## 2019-10-19 DIAGNOSIS — N183 Chronic kidney disease, stage 3 unspecified: Secondary | ICD-10-CM

## 2019-10-19 DIAGNOSIS — E1122 Type 2 diabetes mellitus with diabetic chronic kidney disease: Secondary | ICD-10-CM

## 2019-10-19 DIAGNOSIS — R634 Abnormal weight loss: Secondary | ICD-10-CM

## 2019-10-19 DIAGNOSIS — I129 Hypertensive chronic kidney disease with stage 1 through stage 4 chronic kidney disease, or unspecified chronic kidney disease: Secondary | ICD-10-CM

## 2019-10-19 DIAGNOSIS — I10 Essential (primary) hypertension: Secondary | ICD-10-CM

## 2019-10-19 DIAGNOSIS — E1142 Type 2 diabetes mellitus with diabetic polyneuropathy: Secondary | ICD-10-CM | POA: Diagnosis not present

## 2019-10-19 DIAGNOSIS — F32A Depression, unspecified: Secondary | ICD-10-CM

## 2019-10-19 DIAGNOSIS — G5601 Carpal tunnel syndrome, right upper limb: Secondary | ICD-10-CM

## 2019-10-19 LAB — POCT GLYCOSYLATED HEMOGLOBIN (HGB A1C): Hemoglobin A1C: 7.9 % — AB (ref 4.0–5.6)

## 2019-10-19 LAB — GLUCOSE, CAPILLARY: Glucose-Capillary: 163 mg/dL — ABNORMAL HIGH (ref 70–99)

## 2019-10-19 MED ORDER — METFORMIN HCL ER 500 MG PO TB24: 1000.0000 mg | ORAL_TABLET | Freq: Two times a day (BID) | ORAL | 1 refills | Status: DC

## 2019-10-19 NOTE — Assessment & Plan Note (Signed)
-  This problem is chronic and stable -Patient was supposed to follow-up with urology (Dr. Jeffie Pollock) last year but has not done so yet -I have reminded her about this at her last appointment but she forgot to make an appointment with him -I explained to the patient the importance of following up with Dr. Jeffie Pollock and she expressed understanding and states that she would follow-up with him -No further work-up at this time

## 2019-10-19 NOTE — Assessment & Plan Note (Signed)
-  Patient's weight has been stable since her last visit and is up approximately 4 pounds -I suspect that her initial weight loss was secondary to Ozempic but she appears to be stable currently -We will continue to monitor her weight closely and consider stopping Ozempic if she continues to lose weight and initiating a SGLT2 inhibitor

## 2019-10-19 NOTE — Assessment & Plan Note (Signed)
Lab Results  Component Value Date   HGBA1C 7.9 (A) 10/19/2019   HGBA1C 7.7 (A) 07/21/2019   HGBA1C 14.4 (H) 04/18/2019     Assessment: Diabetes control:  Fair Progress toward A1C goal:   Deteriorated Comments: Patient states that secondary to her nausea and intermittent diarrhea she takes her Metformin intermittently or at a reduced dose because she thinks the side effects are secondary to her Metformin.  She is compliant with her Ozempic 0.25 mg weekly  Plan: Medications:  We will transition the patient to Metformin ER from regular Metformin to see if this will help with her GI side effects Home glucose monitoring: Frequency:   Timing:   Instruction/counseling given: reminded to get eye exam and discussed diet Educational resources provided:   Self management tools provided:   Other plans: We will check a BMP at her next visit.  If patient continues to have GI side effects from her Metformin would consider reducing the dose of Metformin and adding an SGLT2 inhibitor

## 2019-10-19 NOTE — Assessment & Plan Note (Signed)
-  This problem is chronic and stable -We will recheck a BMP at her next visit -No further work-up at this time

## 2019-10-19 NOTE — Assessment & Plan Note (Signed)
-  Patient has a history of right middle finger trigger finger and underwent surgery for this with Dr. Burney Gauze -Patient supposedly underwent carpal tunnel surgery on her right hand at this time -She does have some decreased grip strength in her right hand as well as persistent tingling and numbness in the fingers of both hands -She would like to follow-up with Dr. Burney Gauze for this -I have placed a referral to him today

## 2019-10-19 NOTE — Progress Notes (Signed)
   Subjective:    Patient ID: [name], female    DOB: 11/09/1950, 69 y.o.   MRN: [id number]  HPI  Seen examined this patient.  Patient is here for routine follow-up of her diabetes and hypertension.  Patient complains of intermittent tingling and numbness in both her hands but worse in the right hand and also mild weakness in her right hand.  Patient also complains of intermittent diarrhea and decreased appetite.  Patient states that she is compliant with her medications and denies any other complaints at this time.    Review of Systems  Constitutional: Negative.   HENT: Negative.   Respiratory: Negative.   Cardiovascular: Negative.   Gastrointestinal: Negative.   Musculoskeletal: Negative.   Skin: Negative.   Neurological:       Patient complains of intermittent tingling numbness in both hands but especially in the right hand and her fingers.  Son also notes that patient has increased weakness in her right hand.  Psychiatric/Behavioral: Negative.        Objective:   Physical Exam Constitutional:      Appearance: Normal appearance.  HENT:     Head: Normocephalic and atraumatic.  Cardiovascular:     Rate and Rhythm: Normal rate and regular rhythm.     Heart sounds: Normal heart sounds.  Pulmonary:     Effort: No respiratory distress.     Breath sounds: Normal breath sounds. No wheezing.  Abdominal:     General: Bowel sounds are normal. There is no distension.     Palpations: Abdomen is soft.     Tenderness: There is no abdominal tenderness.  Musculoskeletal:        General: No swelling or tenderness.     Cervical back: Neck supple.  Lymphadenopathy:     Cervical: No cervical adenopathy.  Skin:    General: Skin is warm and dry.  Neurological:     Mental Status: She is alert and oriented to person, place, and time.     Comments: Patient with negative Tinel's and Phalen's but decreased grip strength in her right hand.  Patient with tingling numbness in all fingers  in both hands  Psychiatric:        Mood and Affect: Mood normal.        Behavior: Behavior normal.           Assessment & Plan:  Please see problem based charting for assessment and plan:

## 2019-10-19 NOTE — Assessment & Plan Note (Signed)
-  This problem is chronic and stable -Patient's blood pressure has been within normal limits despite being off her medications -She denies any further episodes of lightheadedness or dizziness -We will continue to monitor blood pressure off medications for now -No further work-up at this time

## 2019-10-19 NOTE — Assessment & Plan Note (Addendum)
-  This problem is chronic and stable -Patient denies any weight loss or diaphoresis.  She does have intermittent episodes of diarrhea which may be secondary to medication induced hyperthyroidism but I suspect is likely secondary to her Metformin -Patient had a TSH done in July which was low (0.064).  At that time she was told to decrease her Synthroid to 50 mcg.  However, patient had continued to take 75 mcg daily but this error was noted at the beginning of this month and she has since started taking 50 mcg -We will recheck her TSH and free T4 at her next visit

## 2019-10-19 NOTE — Assessment & Plan Note (Signed)
Flu shot given today

## 2019-10-19 NOTE — Assessment & Plan Note (Addendum)
>>  ASSESSMENT AND PLAN FOR CARPAL TUNNEL SYNDROME OF RIGHT WRIST WRITTEN ON 10/19/2019 10:22 AM BY Zakara Parkey, MD  -This problem is chronic and slightly worsening -Patient states that since the last time she was here she is having worsening tingling and numbness in her fingers especially in her right hand -Son and patient also note the patient has decreased strength in her right hand -Patient also noted to have decreased grip strength on the right side on exam -I suspect that the patient's neuropathy is likely secondary to her underlying diabetes. -Patient B12 level was normal at her last visit -We will consider adding gabapentin at her next visit if her symptoms persist  >>ASSESSMENT AND PLAN FOR CARPAL TUNNEL SYNDROME WRITTEN ON 10/19/2019 10:24 AM BY Maylon Sailors, MD  -Patient has a history of right middle finger trigger finger and underwent surgery for this with Dr. Burney Gauze -Patient supposedly underwent carpal tunnel surgery on her right hand at this time -She does have some decreased grip strength in her right hand as well as persistent tingling and numbness in the fingers of both hands -She would like to follow-up with Dr. Burney Gauze for this -I have placed a referral to him today

## 2019-10-19 NOTE — Assessment & Plan Note (Signed)
-  This problem is chronic and stable -Patient's PHQ 9 score is 5 today down from 10 -We will continue with current medications including Effexor 150 mg and buspirone 5 mg twice daily -No further work-up at this time -We will continue to monitor closely

## 2019-10-19 NOTE — Patient Instructions (Signed)
-  It was a pleasure seeing you today -We will change your Metformin tablet to Metformin extended release and see if this helps with your decreased appetite and intermittent diarrhea -Please follow-up with your urologist-Dr. Jeffie Pollock -Please make an appointment with your eye doctor for diabetic eye exam -We will give you your flu shot today -Please call me if you have any questions or concerns or if you need any refills

## 2019-10-21 ENCOUNTER — Ambulatory Visit: Payer: Medicare Other | Admitting: *Deleted

## 2019-10-21 DIAGNOSIS — I10 Essential (primary) hypertension: Secondary | ICD-10-CM

## 2019-10-21 DIAGNOSIS — N183 Chronic kidney disease, stage 3 unspecified: Secondary | ICD-10-CM

## 2019-10-21 DIAGNOSIS — E785 Hyperlipidemia, unspecified: Secondary | ICD-10-CM

## 2019-10-21 DIAGNOSIS — E1142 Type 2 diabetes mellitus with diabetic polyneuropathy: Secondary | ICD-10-CM

## 2019-10-21 NOTE — Chronic Care Management (AMB) (Signed)
Chronic Care Management   Follow Up Note   10/21/2019 Name: [name] MRN: [id number] DOB: 08/23/1950  Referred by: Aldine Contes, MD Reason for referral : Chronic Care Management (NIDDM, HTN, HLD, CKD, depression)   [name] is a 69 y.o. year old female who is a primary care patient of Aldine Contes, MD. The CCM team was consulted for assistance with chronic disease management and care coordination needs.    Review of patient status, including review of consultants reports, relevant laboratory and other test results, and collaboration with appropriate care team members and the patient's provider was performed as part of comprehensive patient evaluation and provision of chronic care management services.    SDOH (Social Determinants of Health) assessments performed: No See Care Plan activities for detailed interventions related to [facility])     Outpatient Encounter Medications as of 10/21/2019  Medication Sig Note  . Accu-Chek Softclix Lancets lancets Check blood sugar 5 times a day as instructed   . Acetaminophen 325 MG CAPS Take 325 mg by mouth every 6 (six) hours as needed (for pain).   . ALPHAGAN P 0.1 % SOLN Place 1 drop into both eyes 3 (three) times daily. 04/17/2019: No record of this in past 6 months per external pharmacy records  . Besifloxacin HCl (BESIVANCE) 0.6 % SUSP Besivance 0.6 % eye drops,suspension  INSTILL 1 DROP INTO THE LEFT EYE QID X 2 DAYS AFTER EACH MONTHLY EYE INJECTION 04/17/2019: No record of this in past 6 months per external pharmacy records  . Blood Glucose Monitoring Suppl (ACCU-CHEK GUIDE ME) w/Device KIT Check blood sugar 5 times a day   . busPIRone (BUSPAR) 5 MG tablet Take 1 tablet (5 mg total) by mouth 2 (two) times daily.   . calcium citrate-vitamin D (CITRACAL+D) 315-200 MG-UNIT tablet Take 1 tablet by mouth 2 (two) times daily. (Patient not taking: Reported on 11/20/2017)   . Cholecalciferol (VITAMIN D3) 2000 units TABS Take 2,000 Units at  bedtime by mouth.    . ciprofloxacin (CILOXAN) 0.3 % ophthalmic ointment  04/17/2019: No record of this in past 6 months per external pharmacy records  . cycloSPORINE (RESTASIS) 0.05 % ophthalmic emulsion Restasis MultiDose 0.05 % eye drops  INT 1 GTT IN [facility] EYE BID 04/17/2019: No record of this in past 6 months per external pharmacy records  . erythromycin ophthalmic ointment APPLY A SMALL AMOUNT INTO LEFT EYE TID 04/17/2019: No record of this in past 6 months per external pharmacy records  . glucose 4 GM chewable tablet Chew 4 tablets (16 g total) by mouth as needed for low blood sugar. 04/17/2019: No record of this in past 6 months per external pharmacy records  . glucose blood (ACCU-CHEK GUIDE) test strip Check blood sugar 5 times per day   . glucose blood test strip OneTouch Verio test strips  USE TO CHECK BS BID   . Insulin Pen Needle (BD PEN NEEDLE NANO U/F) 32G X 4 MM MISC USE AS DIRECTED   . ketorolac (ACULAR) 0.5 % ophthalmic solution Place 1 drop into both eyes 3 (three) times daily. 04/17/2019: No record of this in past 6 months per external pharmacy records  . levothyroxine (SYNTHROID) 50 MCG tablet Take 1 tablet (50 mcg total) by mouth daily before breakfast.   . loperamide (IMODIUM) 2 MG capsule Take 1 capsule (2 mg total) by mouth as needed for diarrhea or loose stools.   . metFORMIN (GLUCOPHAGE-XR) 500 MG 24 hr tablet Take 2 tablets (1,000 mg total) by  mouth 2 (two) times daily with a meal.   . Nutritional Supplements (GLUCERNA SNACK SHAKE) LIQD Take 1 Dose by mouth 3 (three) times daily as needed. 05/26/2019: She is drinking vanilla ensure instead of Glucerna  . Olopatadine HCl (PATADAY) 0.2 % SOLN Place 1 drop into both eyes daily as needed (dry eyes).  04/17/2019: No record of this in past 6 months per external pharmacy records  . Polyethyl Glycol-Propyl Glycol (SYSTANE) 0.4-0.3 % GEL ophthalmic gel Place 1 application daily as needed into both eyes (dry eyes/irritation).    .  potassium chloride SA (KLOR-CON) 20 MEQ tablet Take 1 tablet (20 mEq total) by mouth daily for 3 days.   . prednisoLONE acetate (PRED FORTE) 1 % ophthalmic suspension  04/17/2019: No record of this in past 6 months per external pharmacy records  . rosuvastatin (CRESTOR) 20 MG tablet Take 1 tablet (20 mg total) by mouth daily.   . Semaglutide,0.25 or 0.5MG/DOS, (OZEMPIC, 0.25 OR 0.5 MG/DOSE,) 2 MG/1.5ML SOPN Inject 0.25 mg into the skin once a week.   . venlafaxine XR (EFFEXOR-XR) 150 MG 24 hr capsule TAKE 1 CAPSULE BY MOUTH DAILY WITH BREAKFAST    No facility-administered encounter medications on file as of 10/21/2019.     Objective:  BP Readings from Last 3 Encounters:  10/19/19 128/77  08/17/19 (!) 89/55  07/21/19 116/80   Wt Readings from Last 3 Encounters:  10/19/19 112 lb 11.2 oz (51.1 kg)  08/17/19 108 lb 11.2 oz (49.3 kg)  07/21/19 109 lb 6.4 oz (49.6 kg)   Lab Results  Component Value Date   CHOL 228 (H) 10/13/2018   HDL 55 10/13/2018   LDLCALC 154 (H) 10/13/2018   TRIG 108 10/13/2018   CHOLHDL 4.1 10/13/2018   Lab Results  Component Value Date   HGBA1C 7.9 (A) 10/19/2019   HGBA1C 7.7 (A) 07/21/2019   HGBA1C 14.4 (H) 04/18/2019   Lab Results  Component Value Date   MICROALBUR 1.0 12/09/2013   LDLCALC 154 (H) 10/13/2018   CREATININE 0.89 06/30/2019    Goals Addressed              This Visit's Progress     Patient Stated   .  " I don't know why my blood sugar was so high when they took me to the hospital in March. I think I was taking my medications like I'm suppose to." (pt-stated)        CARE PLAN ENTRY (see longitudinal plan of care for additional care plan information)  Current Barriers:  . Chronic Disease Management support, education, and care coordination needs related to HTN, HLD, DMII, CKD Stage 3, and Depression- Clinical Goal(s) related to  HTN, HLD, DMII, CKD Stage 3, and Depression- spoke with patient , she states she continues to have GI upset  with c/o diarrhea, nausea and an episode of vomiting , she thinks it is related to the Metformin, she says she took the long acting form last evening but skipped her a.m. dose today, say she is still residing with her son [name] in [place], reports blood sugars "OK" , denies symptoms of hypoglycemia, she remains  off all blood pressure medicine with blood pressure at goal,  she says her weight has stabilized but she still has no appetite, she asked about Vanilla Glucerna samples; advised her this CCM RN received requested Glucerna samples this week but the flavor is chocolate .  Over the next 30 - 60 days, patient will:  . Work  with the care management team to address educational, disease management, and care coordination needs  . Begin or continue self health monitoring activities as directed today Measure and record CBG (blood glucose) 2 times daily and take medications as prescribed . Call provider office for new or worsened signs and symptoms HTN, HLD, DMII, CKD Stage 3, and Depression . Call care management team with questions or concerns . Verbalize basic understanding of patient centered plan of care established today  Interventions related to HTN, HLD, DMII, CKD Stage 3, and Depression . Prior to interviewing patient, reviewed patient status, including review of recent office visit notes, consultants reports, relevant laboratory and other test results, and medications, completed. . Assessed patient's DM and HTN self management strategies and reviewed blood glucose home readings, assessed for hypoglycemic episodes . Assessed appetite and weight stability . Informed patient this CCM RN will request Vanilla Glucerna samples from  the [facility], encouraged her to always ask for Glucerna coupons when she is in the clinic . Reviewed medications with patient and discussed medication taking behavior . Advised patient to notify clinic provider if GI upset continues as Dr  Dareen Piano indicated in his clinic notes of 9/21 he would stop the Ozempic and add SGT2 inhibitor for blood sugar control . Discussed plans with patient for ongoing care management follow up and provided patient with direct contact information for care management team . Reminded patient to schedule follow up appointment with Dr Jeffie Pollock and to schedule annual eye exam  Patient Self Care Activities related to HTN, HLD, DMII, CKD Stage 3, and Depression Patient is unable to independently self-manage chronic health conditions  Please see past updates related to this goal by clicking on the "Past Updates" button in the selected goal          Plan:   The care management team will reach out to the patient again over the next 30-60 days.    Kelli Churn RN, CCM, [facility] RN Care Manager [phone]

## 2019-10-21 NOTE — Patient Instructions (Signed)
Visit Information It was nice speaking with you today. Please call the clinic if you continue to have GI upset.   Goals Addressed              This Visit's Progress     Patient Stated   .  " I don't know why my blood sugar was so high when they took me to the hospital in March. I think I was taking my medications like I'm suppose to." (pt-stated)        CARE PLAN ENTRY (see longitudinal plan of care for additional care plan information)  Current Barriers:  . Chronic Disease Management support, education, and care coordination needs related to HTN, HLD, DMII, CKD Stage 3, and Depression- Clinical Goal(s) related to  HTN, HLD, DMII, CKD Stage 3, and Depression- spoke with patient , she states she continues to have GI upset with c/o diarrhea, nausea and an episode of vomiting , she thinks it is related to the Metformin, she says she took the long acting form last evening but skipped her a.m. dose today, say she is still residing with her son [name] in [place], reports blood sugars "OK" , denies symptoms of hypoglycemia, she remains  off all blood pressure medicine with blood pressure at goal,  she says her weight has stabilized but she still has no appetite, she asked about Vanilla Glucerna samples; advised her this CCM RN received requested Glucerna samples this week but the flavor is chocolate .  Over the next 30 - 60 days, patient will:  . Work with the care management team to address educational, disease management, and care coordination needs  . Begin or continue self health monitoring activities as directed today Measure and record CBG (blood glucose) 2 times daily and take medications as prescribed . Call provider office for new or worsened signs and symptoms HTN, HLD, DMII, CKD Stage 3, and Depression . Call care management team with questions or concerns . Verbalize basic understanding of patient centered plan of care established today  Interventions related to HTN, HLD, DMII, CKD  Stage 3, and Depression . Prior to interviewing patient, reviewed patient status, including review of recent office visit notes, consultants reports, relevant laboratory and other test results, and medications, completed. . Assessed patient's DM and HTN self management strategies and reviewed blood glucose home readings, assessed for hypoglycemic episodes . Assessed appetite and weight stability . Informed patient this CCM RN will request Vanilla Glucerna samples from  the [facility], encouraged her to always ask for Glucerna coupons when she is in the clinic . Reviewed medications with patient and discussed medication taking behavior . Advised patient to notify clinic provider if GI upset continues as Dr Dareen Piano indicated in his clinic notes of 9/21 he would stop the Ozempic and add SGT2 inhibitor for blood sugar control . Discussed plans with patient for ongoing care management follow up and provided patient with direct contact information for care management team . Reminded patient to schedule follow up appointment with Dr Jeffie Pollock and to schedule annual eye exam  Patient Self Care Activities related to HTN, HLD, DMII, CKD Stage 3, and Depression Patient is unable to independently self-manage chronic health conditions  Please see past updates related to this goal by clicking on the "Past Updates" button in the selected goal         The patient verbalized understanding of instructions provided today and declined a print copy of patient instruction materials.   The care  management team will reach out to the patient again over the next 30-60 days.   Kelli Churn RN, CCM, [facility] RN Care Manager [phone]

## 2019-10-27 ENCOUNTER — Telehealth: Payer: Self-pay

## 2019-10-27 NOTE — Telephone Encounter (Signed)
RTC, patient states she was transitioned to Metformin 500 ER , 2 twice daily due to GI upset (diarrhea).  She started this on 10/19/19 and states her diarrhea worsened and could only continue the med for 2 days.  She is back on her regular metformin 500mg  , but only taking 1 pill twice daily and states she has no GI upset. She states her son is checking her sugar and she can "only remember she had one sugar of 136 and the other one was high, but doesn't remember what it was".  Per LOV on 10/19/19, it is noted that if she continued w/ diarrhea MD may recommend lowering metformin dose and adding another med. Will forward to Dr. Dareen Piano to advise. Thanks, SChaplin, RN,BSN

## 2019-10-27 NOTE — Telephone Encounter (Signed)
Requesting to speak with a nurse about metFORMIN (GLUCOPHAGE-XR) 500 MG 24 hr tablet. Please call pt back.

## 2019-10-28 NOTE — Telephone Encounter (Signed)
RTC to patient, she was notified of Dr. Wilber Bihari instructions and verbalized understanding.  She was very Patent attorney. SChaplin, RN,BSN

## 2019-10-28 NOTE — Telephone Encounter (Signed)
I agree with her decreasing her metformin dose to 500 mg twice a day. Please ask her to monitor her blood sugars closely and call us if she is running high

## 2019-11-10 ENCOUNTER — Other Ambulatory Visit: Payer: Self-pay | Admitting: Internal Medicine

## 2019-11-10 DIAGNOSIS — Z794 Long term (current) use of insulin: Secondary | ICD-10-CM

## 2019-11-10 DIAGNOSIS — E1142 Type 2 diabetes mellitus with diabetic polyneuropathy: Secondary | ICD-10-CM

## 2019-11-10 MED ORDER — METFORMIN HCL ER 500 MG PO TB24: 1000.0000 mg | ORAL_TABLET | Freq: Two times a day (BID) | ORAL | 1 refills | Status: DC

## 2019-11-10 NOTE — Telephone Encounter (Signed)
Need refill on metFORMIN (GLUCOPHAGE-XR) 500 MG 24 hr tablet ;pt contact [place], [place] - [place]

## 2019-11-11 NOTE — Telephone Encounter (Signed)
Pt requesting a call back about her  metFORMIN (GLUCOPHAGE-XR) 500 MG 24 hr tablet  Pt states the pharmacy is unable to refill medication because it is delayed.  Pt would like to know what else can she take.  Amanda, [place]

## 2019-11-12 MED ORDER — METFORMIN HCL 500 MG PO TABS: 500.0000 mg | ORAL_TABLET | Freq: Two times a day (BID) | ORAL | 11 refills | Status: DC

## 2019-11-12 NOTE — Telephone Encounter (Signed)
She can take her XR. Thank you

## 2019-11-12 NOTE — Telephone Encounter (Signed)
[name] has been down today.  RN called pharmacy to give VO for the metformin from earlier today.  When speaking to pharmacist, he told RN that the metformin XR is no longer on back order and the patient picked up #360 on 10/20/19.  TC to patient, she confirms she picked up the Metformin XR on 10/20/19.  She is wanting to know if MD thinks she can take 1 twice daily of the XR that she's already picked up.  Pt has hx of diarrhea and had started taking regular metformin X1, twice daily which she states has helped with her diarrhea.   Thank you, SChaplin, RN,BSN

## 2019-11-12 NOTE — Telephone Encounter (Signed)
Put an order in for metformin

## 2019-11-15 NOTE — Telephone Encounter (Signed)
Patient notified. SChaplin, RN,BSN

## 2019-11-19 ENCOUNTER — Other Ambulatory Visit: Payer: Self-pay

## 2019-11-19 DIAGNOSIS — E1142 Type 2 diabetes mellitus with diabetic polyneuropathy: Secondary | ICD-10-CM

## 2019-11-19 MED ORDER — ROSUVASTATIN CALCIUM 20 MG PO TABS: 20.0000 mg | ORAL_TABLET | Freq: Every day | ORAL | 3 refills | Status: DC

## 2019-11-19 NOTE — Telephone Encounter (Signed)
rosuvastatin (CRESTOR) 20 MG tablet  busPIRone (BUSPAR) 5 MG tablet  Refill request @  [place], [address], [address] Phone:  [phone]  Fax:  [phone]

## 2019-11-19 NOTE — Telephone Encounter (Signed)
Buspar #180 with 3 refills sent 08/17/2019. Will forward request for Crestor refill

## 2019-11-23 ENCOUNTER — Ambulatory Visit: Payer: Medicare Other | Admitting: *Deleted

## 2019-11-23 DIAGNOSIS — N183 Chronic kidney disease, stage 3 unspecified: Secondary | ICD-10-CM

## 2019-11-23 DIAGNOSIS — Z794 Long term (current) use of insulin: Secondary | ICD-10-CM

## 2019-11-23 DIAGNOSIS — I10 Essential (primary) hypertension: Secondary | ICD-10-CM

## 2019-11-23 DIAGNOSIS — E785 Hyperlipidemia, unspecified: Secondary | ICD-10-CM

## 2019-11-23 DIAGNOSIS — E1142 Type 2 diabetes mellitus with diabetic polyneuropathy: Secondary | ICD-10-CM

## 2019-11-23 NOTE — Patient Instructions (Signed)
Visit Information It was nice speaking with you today. Goals Addressed              This Visit's Progress     Patient Stated   .  "my blood sugar is much better and my GI symptoms are gone.". (pt-stated)        CARE PLAN ENTRY (see longitudinal plan of care for additional care plan information)  Current Barriers:  . Chronic Disease Management support, education, and care coordination needs related to HTN, HLD, DMII, CKD Stage 3, and Depression- Clinical Goal(s) related to  HTN, HLD, DMII, CKD Stage 3, and Depression- spoke with patient to complete follow up assessment , she states she is only taking 500 mg of regular Metformin twice daily and the GI upset has subsided, she recalls her most recent blood sugar reading taken by her son [name] as 11, she says she would appreciate Vanilla Glucerna samples  Over the next 30 - 60 days, patient will:  . Work with the care management team to address educational, disease management, and care coordination needs  . Begin or continue self health monitoring activities as directed today Measure and record CBG (blood glucose) 2 times daily and take medications as prescribed . Call provider office for new or worsened signs and symptoms HTN, HLD, DMII, CKD Stage 3, and Depression . Call care management team with questions or concerns . Verbalize basic understanding of patient centered plan of care established today  Interventions related to HTN, HLD, DMII, CKD Stage 3, and Depression . Prior to interviewing patient, reviewed patient status, including review of recent office visit notes, consultants reports, relevant laboratory and other test results, and medications, completed. . Assessed patient's DM and HTN self management strategies and reviewed blood glucose home readings, assessed for hypoglycemic episodes . Assessed appetite and weight stability . Informed patient this CCM RN received Vanilla Glucerna samples from  the [facility] and will bring them to her appointment at [facility] on 12/15/19 along with Glucerna coupons . Reviewed medications with patient and discussed medication taking behavior . Provided positive reinforcement for patient calling the clinic to report her GI distress  . Discussed plans with patient for ongoing care management follow up and provided patient with direct contact information for care management team . Reminded patient to schedule follow up appointment with Dr Jeffie Pollock and to schedule annual eye exam  Patient Self Care Activities related to HTN, HLD, DMII, CKD Stage 3, and Depression Patient is unable to independently self-manage chronic health conditions  Please see past updates related to this goal by clicking on the "Past Updates" button in the selected goal         The patient verbalized understanding of instructions provided today and declined a print copy of patient instruction materials.   The care management team will reach out to the patient again over the next 30-60 days.   Kelli Churn RN, CCM, [facility] RN Care Manager [phone]

## 2019-11-23 NOTE — Chronic Care Management (AMB) (Signed)
Chronic Care Management   Follow Up Note   11/23/2019 Name: [name] MRN: [id number] DOB: 05/29/1950  Referred by: Aldine Contes, MD Reason for referral : Chronic Care Management (NIDDM, HTN, HLD, CKD, depression)   [name] is a 69 y.o. year old female who is a primary care patient of Aldine Contes, MD. The CCM team was consulted for assistance with chronic disease management and care coordination needs.    Review of patient status, including review of consultants reports, relevant laboratory and other test results, and collaboration with appropriate care team members and the patient's provider was performed as part of comprehensive patient evaluation and provision of chronic care management services.    SDOH (Social Determinants of Health) assessments performed: No See Care Plan activities for detailed interventions related to [facility])     Outpatient Encounter Medications as of 11/23/2019  Medication Sig Note  . Accu-Chek Softclix Lancets lancets Check blood sugar 5 times a day as instructed   . Acetaminophen 325 MG CAPS Take 325 mg by mouth every 6 (six) hours as needed (for pain).   . ALPHAGAN P 0.1 % SOLN Place 1 drop into both eyes 3 (three) times daily. 04/17/2019: No record of this in past 6 months per external pharmacy records  . Besifloxacin HCl (BESIVANCE) 0.6 % SUSP Besivance 0.6 % eye drops,suspension  INSTILL 1 DROP INTO THE LEFT EYE QID X 2 DAYS AFTER EACH MONTHLY EYE INJECTION 04/17/2019: No record of this in past 6 months per external pharmacy records  . Blood Glucose Monitoring Suppl (ACCU-CHEK GUIDE ME) w/Device KIT Check blood sugar 5 times a day   . busPIRone (BUSPAR) 5 MG tablet Take 1 tablet (5 mg total) by mouth 2 (two) times daily.   . calcium citrate-vitamin D (CITRACAL+D) 315-200 MG-UNIT tablet Take 1 tablet by mouth 2 (two) times daily. (Patient not taking: Reported on 11/20/2017)   . Cholecalciferol (VITAMIN D3) 2000 units TABS Take 2,000 Units  at bedtime by mouth.    . ciprofloxacin (CILOXAN) 0.3 % ophthalmic ointment  04/17/2019: No record of this in past 6 months per external pharmacy records  . cycloSPORINE (RESTASIS) 0.05 % ophthalmic emulsion Restasis MultiDose 0.05 % eye drops  INT 1 GTT IN [facility] EYE BID 04/17/2019: No record of this in past 6 months per external pharmacy records  . erythromycin ophthalmic ointment APPLY A SMALL AMOUNT INTO LEFT EYE TID 04/17/2019: No record of this in past 6 months per external pharmacy records  . glucose 4 GM chewable tablet Chew 4 tablets (16 g total) by mouth as needed for low blood sugar. 04/17/2019: No record of this in past 6 months per external pharmacy records  . glucose blood (ACCU-CHEK GUIDE) test strip Check blood sugar 5 times per day   . glucose blood test strip OneTouch Verio test strips  USE TO CHECK BS BID   . Insulin Pen Needle (BD PEN NEEDLE NANO U/F) 32G X 4 MM MISC USE AS DIRECTED   . ketorolac (ACULAR) 0.5 % ophthalmic solution Place 1 drop into both eyes 3 (three) times daily. 04/17/2019: No record of this in past 6 months per external pharmacy records  . levothyroxine (SYNTHROID) 50 MCG tablet Take 1 tablet (50 mcg total) by mouth daily before breakfast.   . loperamide (IMODIUM) 2 MG capsule Take 1 capsule (2 mg total) by mouth as needed for diarrhea or loose stools.   . metFORMIN (GLUCOPHAGE) 500 MG tablet Take 1 tablet (500 mg total) by mouth 2 (  two) times daily with a meal.   . Nutritional Supplements (GLUCERNA SNACK SHAKE) LIQD Take 1 Dose by mouth 3 (three) times daily as needed. 05/26/2019: She is drinking vanilla ensure instead of Glucerna  . Olopatadine HCl (PATADAY) 0.2 % SOLN Place 1 drop into both eyes daily as needed (dry eyes).  04/17/2019: No record of this in past 6 months per external pharmacy records  . Polyethyl Glycol-Propyl Glycol (SYSTANE) 0.4-0.3 % GEL ophthalmic gel Place 1 application daily as needed into both eyes (dry eyes/irritation).    . potassium  chloride SA (KLOR-CON) 20 MEQ tablet Take 1 tablet (20 mEq total) by mouth daily for 3 days.   . prednisoLONE acetate (PRED FORTE) 1 % ophthalmic suspension  04/17/2019: No record of this in past 6 months per external pharmacy records  . rosuvastatin (CRESTOR) 20 MG tablet Take 1 tablet (20 mg total) by mouth daily.   . Semaglutide,0.25 or 0.5MG/DOS, (OZEMPIC, 0.25 OR 0.5 MG/DOSE,) 2 MG/1.5ML SOPN Inject 0.25 mg into the skin once a week.   . venlafaxine XR (EFFEXOR-XR) 150 MG 24 hr capsule TAKE 1 CAPSULE BY MOUTH DAILY WITH BREAKFAST    No facility-administered encounter medications on file as of 11/23/2019.     Objective:  Wt Readings from Last 3 Encounters:  10/19/19 112 lb 11.2 oz (51.1 kg)  08/17/19 108 lb 11.2 oz (49.3 kg)  07/21/19 109 lb 6.4 oz (49.6 kg)   BP Readings from Last 3 Encounters:  10/19/19 128/77  08/17/19 (!) 89/55  07/21/19 116/80   Lab Results  Component Value Date   HGB 12.1 06/16/2019    Goals Addressed              This Visit's Progress     Patient Stated   .  "my blood sugar is much better and my GI symptoms are gone.". (pt-stated)        CARE PLAN ENTRY (see longitudinal plan of care for additional care plan information)  Current Barriers:  . Chronic Disease Management support, education, and care coordination needs related to HTN, HLD, DMII, CKD Stage 3, and Depression- Clinical Goal(s) related to  HTN, HLD, DMII, CKD Stage 3, and Depression- spoke with patient to complete follow up assessment , she states she is only taking 500 mg of regular Metformin twice daily and the GI upset has subsided, she recalls her most recent blood sugar reading taken by her son [name] as 49, she says she would appreciate Vanilla Glucerna samples  Over the next 30 - 60 days, patient will:  . Work with the care management team to address educational, disease management, and care coordination needs  . Begin or continue self health monitoring activities as directed  today Measure and record CBG (blood glucose) 2 times daily and take medications as prescribed . Call provider office for new or worsened signs and symptoms HTN, HLD, DMII, CKD Stage 3, and Depression . Call care management team with questions or concerns . Verbalize basic understanding of patient centered plan of care established today  Interventions related to HTN, HLD, DMII, CKD Stage 3, and Depression . Prior to interviewing patient, reviewed patient status, including review of recent office visit notes, consultants reports, relevant laboratory and other test results, and medications, completed. . Assessed patient's DM and HTN self management strategies and reviewed blood glucose home readings, assessed for hypoglycemic episodes . Assessed appetite and weight stability . Informed patient this CCM RN received Vanilla Glucerna samples from  the [employer]  [place] and will bring them to her appointment at [place] on 12/15/19 along with Glucerna coupons . Reviewed medications with patient and discussed medication taking behavior . Provided positive reinforcement for patient calling the clinic to report her GI distress  . Discussed plans with patient for ongoing care management follow up and provided patient with direct contact information for care management team . Reminded patient to schedule follow up appointment with Dr Jeffie Pollock and to schedule annual eye exam  Patient Self Care Activities related to HTN, HLD, DMII, CKD Stage 3, and Depression Patient is unable to independently self-manage chronic health conditions  Please see past updates related to this goal by clicking on the "Past Updates" button in the selected goal          Plan:   The care management team will reach out to the patient again over the next 30-60 days.    Kelli Churn RN, CCM, [facility] RN Care Manager [phone]

## 2019-11-23 NOTE — Progress Notes (Signed)
Internal Medicine Clinic Resident  I have personally reviewed this encounter including the documentation in this note and/or discussed this patient with the care management provider. I will address any urgent items identified by the care management provider and will communicate my actions to the patient's PCP. I have reviewed the patient's CCM visit with my supervising attending, Dr Vincent.  Earnestine Shipp, MD  [facility] PGY-2 11/23/2019    

## 2019-11-24 NOTE — Progress Notes (Signed)
Internal Medicine Clinic Attending  CCM services provided by the care management provider and their documentation were discussed with Dr. Aslam. We reviewed the pertinent findings, urgent action items addressed by the resident and non-urgent items to be addressed by the PCP.  I agree with the assessment, diagnosis, and plan of care documented in the CCM and resident's note.  Montrail Mehrer Thomas Etana Beets, MD 11/24/2019  

## 2019-11-25 ENCOUNTER — Other Ambulatory Visit: Payer: Self-pay

## 2019-11-25 DIAGNOSIS — F32A Depression, unspecified: Secondary | ICD-10-CM

## 2019-11-25 NOTE — Telephone Encounter (Signed)
busPIRone (BUSPAR) 5 MG tablet, REFILL REQUEST @  [place], [address], [address] Phone:  [phone]  Fax:  [phone]

## 2019-11-26 MED ORDER — BUSPIRONE HCL 5 MG PO TABS: 5.0000 mg | ORAL_TABLET | Freq: Two times a day (BID) | ORAL | 3 refills | Status: DC

## 2019-12-15 ENCOUNTER — Other Ambulatory Visit: Payer: Self-pay

## 2019-12-15 ENCOUNTER — Encounter: Payer: Self-pay | Admitting: Podiatry

## 2019-12-15 ENCOUNTER — Ambulatory Visit: Payer: Medicare Other | Admitting: *Deleted

## 2019-12-15 ENCOUNTER — Ambulatory Visit (INDEPENDENT_AMBULATORY_CARE_PROVIDER_SITE_OTHER): Payer: Medicare Other | Admitting: Podiatry

## 2019-12-15 DIAGNOSIS — B351 Tinea unguium: Secondary | ICD-10-CM | POA: Diagnosis not present

## 2019-12-15 DIAGNOSIS — E1149 Type 2 diabetes mellitus with other diabetic neurological complication: Secondary | ICD-10-CM

## 2019-12-15 DIAGNOSIS — E1142 Type 2 diabetes mellitus with diabetic polyneuropathy: Secondary | ICD-10-CM

## 2019-12-15 DIAGNOSIS — N183 Chronic kidney disease, stage 3 unspecified: Secondary | ICD-10-CM

## 2019-12-15 DIAGNOSIS — Z794 Long term (current) use of insulin: Secondary | ICD-10-CM

## 2019-12-15 DIAGNOSIS — L84 Corns and callosities: Secondary | ICD-10-CM | POA: Diagnosis not present

## 2019-12-15 DIAGNOSIS — M2010 Hallux valgus (acquired), unspecified foot: Secondary | ICD-10-CM | POA: Diagnosis not present

## 2019-12-15 DIAGNOSIS — E785 Hyperlipidemia, unspecified: Secondary | ICD-10-CM

## 2019-12-15 DIAGNOSIS — I10 Essential (primary) hypertension: Secondary | ICD-10-CM

## 2019-12-15 NOTE — Chronic Care Management (AMB) (Signed)
  Chronic Care Management   Note  12/15/2019 Name: [name] MRN: [id number] DOB: 1950-07-05  Delivered 2 multi pack Vanilla Glucerna nutritional supplements and several Glucerna coupons for $3 off any multipack to Dr Burnell Blanks office.Patient had already been taken back to exam room so requested front desk staff give to patient upon checkout. Also left message on patient's cell phone advising her.  Follow up plan: The care management team will reach out to the patient again over the next 30-60 days.   Kelli Churn RN, CCM, [facility] RN Care Manager [phone]

## 2019-12-15 NOTE — Progress Notes (Signed)
This patient returns to my office for at risk foot care.  This patient requires this care by a professional since this patient will be at risk due to having diabetes and chronic kidney disease.    This patient is unable to cut nails herself since the patient cannot reach her nails.These nails are painful walking and wearing shoes.   Patient has callus under her big toe joint left foot. This patient presents for at risk foot care today.  General Appearance  Alert, conversant and in no acute stress.  Vascular  Dorsalis pedis and posterior tibial  pulses are palpable  bilaterally.  Capillary return is within normal limits  bilaterally. Temperature is within normal limits  bilaterally.  Neurologic  Senn-Weinstein monofilament wire test diminished  bilaterally. Muscle power within normal limits bilaterally.  Nails Thick disfigured discolored nails with subungual debris  from hallux to fifth toes bilaterally. No evidence of bacterial infection or drainage bilaterally.  Orthopedic  No limitations of motion  feet .  No crepitus or effusions noted.  No bony pathology or digital deformities noted.  Skin  normotropic skin with no porokeratosis noted bilaterally.  No signs of infections or ulcers noted.  Callus sub 1st MPJ left foot.   Onychomycosis  Pain in right toes  Pain in left toes  Porokeratosis left forefoot.  Consent was obtained for treatment procedures.   Mechanical debridement of nails 1-5  bilaterally performed with a nail nipper.  Filed with dremel without incident. Debridement of callus with # 15 blade.   Return office visit  3 months                    Told patient to return for periodic foot care and evaluation due to potential at risk complications.   Lisa Blakeman DPM  

## 2019-12-15 NOTE — Progress Notes (Signed)
Internal Medicine Clinic Resident  I have personally reviewed this encounter including the documentation in this note and/or discussed this patient with the care management provider. I will address any urgent items identified by the care management provider and will communicate my actions to the patient's PCP. I have reviewed the patient's CCM visit with my supervising attending, Dr Vincent.  Evangelyn Crouse, MD 12/15/2019  

## 2019-12-15 NOTE — Progress Notes (Signed)
Internal Medicine Clinic Attending  CCM services provided by the care management provider and their documentation were discussed with Dr. Basaraba. We reviewed the pertinent findings, urgent action items addressed by the resident and non-urgent items to be addressed by the PCP.  I agree with the assessment, diagnosis, and plan of care documented in the CCM and resident's note.  Sade Mehlhoff Thomas Kensly Bowmer, MD 12/15/2019  

## 2019-12-24 ENCOUNTER — Other Ambulatory Visit: Payer: Self-pay | Admitting: Internal Medicine

## 2019-12-24 DIAGNOSIS — F32A Depression, unspecified: Secondary | ICD-10-CM

## 2019-12-28 ENCOUNTER — Ambulatory Visit: Payer: Medicare Other | Admitting: *Deleted

## 2019-12-28 DIAGNOSIS — N183 Chronic kidney disease, stage 3 unspecified: Secondary | ICD-10-CM

## 2019-12-28 DIAGNOSIS — Z794 Long term (current) use of insulin: Secondary | ICD-10-CM

## 2019-12-28 DIAGNOSIS — E1142 Type 2 diabetes mellitus with diabetic polyneuropathy: Secondary | ICD-10-CM

## 2019-12-28 DIAGNOSIS — I10 Essential (primary) hypertension: Secondary | ICD-10-CM

## 2019-12-28 DIAGNOSIS — E785 Hyperlipidemia, unspecified: Secondary | ICD-10-CM

## 2019-12-28 NOTE — Progress Notes (Signed)
Internal Medicine Clinic Resident  I have personally reviewed this encounter including the documentation in this note and/or discussed this patient with the care management provider. I will address any urgent items identified by the care management provider and will communicate my actions to the patient's PCP. I have reviewed the patient's CCM visit with my supervising attending, Dr Narendra.  [name] M Karry Causer, MD 12/28/2019    

## 2019-12-28 NOTE — Patient Instructions (Signed)
Visit Information It was nice speaking with you today. Goals Addressed              This Visit's Progress     Patient Stated   .  "my blood sugar is much better and my GI symptoms are gone.". (pt-stated)        CARE PLAN ENTRY (see longitudinal plan of care for additional care plan information)  Current Barriers:  . Chronic Disease Management support, education, and care coordination needs related to HTN, HLD, DMII, CKD Stage 3, and Depression- Clinical Goal(s) related to  HTN, HLD, DMII, CKD Stage 3, and Depression- spoke with patient to complete follow up assessment , she states she is only taking 500 mg of regular Metformin twice daily and the GI upset has subsided, denies diarrhea, she recalls her most recent blood sugar reading taken by her son [name] as 80, she says she received the [place] samples this CCM RN left at her podiatrist office on 12/15/19 and expressed appreciation  Over the next 30 - 60 days, patient will:  . Work with the care management team to address educational, disease management, and care coordination needs  . Begin or continue self health monitoring activities as directed today Measure and record CBG (blood glucose) 2 times daily and take medications as prescribed . Call provider office for new or worsened signs and symptoms HTN, HLD, DMII, CKD Stage 3, and Depression . Call care management team with questions or concerns . Verbalize basic understanding of patient centered plan of care established today  Interventions related to HTN, HLD, DMII, CKD Stage 3, and Depression . Prior to interviewing patient, reviewed patient status, including review of recent office visit notes, consultants reports, relevant laboratory and other test results, and medications, completed. . Assessed patient's DM and HTN self management strategies and reviewed blood glucose home readings, assessed for hypoglycemic episodes . Assessed appetite and weight stability . Ensured  patient received Vanilla Glucerna samples and coupons left at [facility] on 12/15/19 . Reviewed medications with patient and discussed medication taking behavior . Discussed plans with patient for ongoing care management follow up and provided patient with direct contact information for care management team . Reminded patient to schedule follow up appointment with Dr Jeffie Pollock, schedule annual eye exam and clinic appointment after the first of the year  Patient Self Care Activities related to HTN, HLD, DMII, CKD Stage 3, and Depression Patient is unable to independently self-manage chronic health conditions  Please see past updates related to this goal by clicking on the "Past Updates" button in the selected goal         The patient verbalized understanding of instructions, educational materials, and care plan provided today and declined offer to receive copy of patient instructions, educational materials, and care plan.   The care management team will reach out to the patient again over the next 30-60 days.   Kelli Churn RN, CCM, [facility] RN Care Manager [phone]

## 2019-12-28 NOTE — Chronic Care Management (AMB) (Signed)
Chronic Care Management   Follow Up Note   12/28/2019 Name: [name] MRN: [id number] DOB: [birth year]  Referred by: Christy Contes, MD Reason for referral : Chronic Care Management (NIDDM, HTN, HLD, CKD, depression)   [name] is a 69 y.o. year old female who is a primary care patient of Christy Contes, MD. The CCM team was consulted for assistance with chronic disease management and care coordination needs.    Review of patient status, including review of consultants reports, relevant laboratory and other test results, and collaboration with appropriate care team members and the patient's provider was performed as part of comprehensive patient evaluation and provision of chronic care management services.    SDOH (Social Determinants of Health) assessments performed: No See Care Plan activities for detailed interventions related to [facility])     Outpatient Encounter Medications as of 12/28/2019  Medication Sig Note  . Accu-Chek Softclix Lancets lancets Check blood sugar 5 times a day as instructed   . Acetaminophen 325 MG CAPS Take 325 mg by mouth every 6 (six) hours as needed (for pain).   . ALPHAGAN P 0.1 % SOLN Place 1 drop into both eyes 3 (three) times daily. 04/17/2019: No record of this in past 6 months per external pharmacy records  . Besifloxacin HCl (BESIVANCE) 0.6 % SUSP Besivance 0.6 % eye drops,suspension  INSTILL 1 DROP INTO THE LEFT EYE QID X 2 DAYS AFTER EACH MONTHLY EYE INJECTION 04/17/2019: No record of this in past 6 months per external pharmacy records  . Blood Glucose Monitoring Suppl (ACCU-CHEK GUIDE ME) w/Device KIT Check blood sugar 5 times a day   . busPIRone (BUSPAR) 5 MG tablet Take 1 tablet (5 mg total) by mouth 2 (two) times daily.   . calcium citrate-vitamin D (CITRACAL+D) 315-200 MG-UNIT tablet Take 1 tablet by mouth 2 (two) times daily. (Patient not taking: Reported on 11/20/2017)   . Cholecalciferol (VITAMIN D3) 2000 units TABS Take 2,000 Units  at bedtime by mouth.    . ciprofloxacin (CILOXAN) 0.3 % ophthalmic ointment  04/17/2019: No record of this in past 6 months per external pharmacy records  . cycloSPORINE (RESTASIS) 0.05 % ophthalmic emulsion Restasis MultiDose 0.05 % eye drops  INT 1 GTT IN [facility] EYE BID 04/17/2019: No record of this in past 6 months per external pharmacy records  . erythromycin ophthalmic ointment APPLY A SMALL AMOUNT INTO LEFT EYE TID 04/17/2019: No record of this in past 6 months per external pharmacy records  . glucose 4 GM chewable tablet Chew 4 tablets (16 g total) by mouth as needed for low blood sugar. 04/17/2019: No record of this in past 6 months per external pharmacy records  . glucose blood (ACCU-CHEK GUIDE) test strip Check blood sugar 5 times per day   . glucose blood test strip OneTouch Verio test strips  USE TO CHECK BS BID   . Insulin Pen Needle (BD PEN NEEDLE NANO U/F) 32G X 4 MM MISC USE AS DIRECTED   . ketorolac (ACULAR) 0.5 % ophthalmic solution Place 1 drop into both eyes 3 (three) times daily. 04/17/2019: No record of this in past 6 months per external pharmacy records  . levothyroxine (SYNTHROID) 50 MCG tablet Take 1 tablet (50 mcg total) by mouth daily before breakfast.   . loperamide (IMODIUM) 2 MG capsule Take 1 capsule (2 mg total) by mouth as needed for diarrhea or loose stools.   . metFORMIN (GLUCOPHAGE) 500 MG tablet Take 1 tablet (500 mg total) by mouth 2 (  two) times daily with a meal.   . Nutritional Supplements (GLUCERNA SNACK SHAKE) LIQD Take 1 Dose by mouth 3 (three) times daily as needed. 05/26/2019: She is drinking vanilla ensure instead of Glucerna  . Olopatadine HCl (PATADAY) 0.2 % SOLN Place 1 drop into both eyes daily as needed (dry eyes).  04/17/2019: No record of this in past 6 months per external pharmacy records  . Polyethyl Glycol-Propyl Glycol (SYSTANE) 0.4-0.3 % GEL ophthalmic gel Place 1 application daily as needed into both eyes (dry eyes/irritation).    . potassium  chloride SA (KLOR-CON) 20 MEQ tablet Take 1 tablet (20 mEq total) by mouth daily for 3 days.   . prednisoLONE acetate (PRED FORTE) 1 % ophthalmic suspension  04/17/2019: No record of this in past 6 months per external pharmacy records  . rosuvastatin (CRESTOR) 20 MG tablet Take 1 tablet (20 mg total) by mouth daily.   . Semaglutide,0.25 or 0.5MG/DOS, (OZEMPIC, 0.25 OR 0.5 MG/DOSE,) 2 MG/1.5ML SOPN Inject 0.25 mg into the skin once a week.   . venlafaxine XR (EFFEXOR-XR) 150 MG 24 hr capsule TAKE 1 CAPSULE BY MOUTH DAILY WITH BREAKFAST    No facility-administered encounter medications on file as of 12/28/2019.     Objective:  Wt Readings from Last 3 Encounters:  10/19/19 112 lb 11.2 oz (51.1 kg)  08/17/19 108 lb 11.2 oz (49.3 kg)  07/21/19 109 lb 6.4 oz (49.6 kg)   Lab Results  Component Value Date   HGBA1C 7.9 (A) 10/19/2019   HGBA1C 7.7 (A) 07/21/2019   HGBA1C 14.4 (H) 04/18/2019   Lab Results  Component Value Date   MICROALBUR 1.0 12/09/2013   LDLCALC 154 (H) 10/13/2018   CREATININE 0.89 06/30/2019   Lab Results  Component Value Date   CREATININE 0.89 06/30/2019   BUN 15 06/30/2019   NA 141 06/30/2019   K 4.3 06/30/2019   CL 101 06/30/2019   CO2 24 06/30/2019    Goals Addressed              This Visit's Progress     Patient Stated   .  "my blood sugar is much better and my GI symptoms are gone.". (pt-stated)        CARE PLAN ENTRY (see longitudinal plan of care for additional care plan information)  Current Barriers:  . Chronic Disease Management support, education, and care coordination needs related to HTN, HLD, DMII, CKD Stage 3, and Depression- Clinical Goal(s) related to  HTN, HLD, DMII, CKD Stage 3, and Depression- spoke with patient to complete follow up assessment , she states she is only taking 500 mg of regular Metformin twice daily and the GI upset has subsided, denies diarrhea, she recalls her most recent blood sugar reading taken by her son [name] as  34, she says she received the [place] samples this CCM RN left at her podiatrist office on 12/15/19 and expressed appreciation  Over the next 30 - 60 days, patient will:  . Work with the care management team to address educational, disease management, and care coordination needs  . Begin or continue self health monitoring activities as directed today Measure and record CBG (blood glucose) 2 times daily and take medications as prescribed . Call provider office for new or worsened signs and symptoms HTN, HLD, DMII, CKD Stage 3, and Depression . Call care management team with questions or concerns . Verbalize basic understanding of patient centered plan of care established today  Interventions related to HTN, HLD,  DMII, CKD Stage 3, and Depression . Prior to interviewing patient, reviewed patient status, including review of recent office visit notes, consultants reports, relevant laboratory and other test results, and medications, completed. . Assessed patient's DM and HTN self management strategies and reviewed blood glucose home readings, assessed for hypoglycemic episodes . Assessed appetite and weight stability . Ensured patient received Vanilla Glucerna samples and coupons left at [place] on 12/15/19 by this CCM RN  . Reviewed medications with patient and discussed medication taking behavior . Discussed plans with patient for ongoing care management follow up and provided patient with direct contact information for care management team . Reminded patient to schedule follow up appointment with Dr Jeffie Pollock, schedule annual eye exam and clinic appointment after the first of the year  Patient Self Care Activities related to HTN, HLD, DMII, CKD Stage 3, and Depression Patient is unable to independently self-manage chronic health conditions  Please see past updates related to this goal by clicking on the "Past Updates" button in the selected goal          Plan:   The care  management team will reach out to the patient again over the next 30-60 days.    Kelli Churn RN, CCM, [facility] RN Care Manager [phone]

## 2019-12-29 NOTE — Progress Notes (Signed)
Internal Medicine Clinic Attending  CCM services provided by the care management provider and their documentation were discussed with Dr. Sherry Ruffing. We reviewed the pertinent findings, urgent action items addressed by the resident and non-urgent items to be addressed by the PCP.  I agree with the assessment, diagnosis, and plan of care documented in the CCM and resident's note.  Aldine Contes, MD 12/29/2019

## 2020-01-17 ENCOUNTER — Other Ambulatory Visit: Payer: Self-pay | Admitting: Internal Medicine

## 2020-01-17 DIAGNOSIS — E1142 Type 2 diabetes mellitus with diabetic polyneuropathy: Secondary | ICD-10-CM

## 2020-01-18 ENCOUNTER — Other Ambulatory Visit: Payer: Self-pay | Admitting: Internal Medicine

## 2020-01-18 DIAGNOSIS — Z1231 Encounter for screening mammogram for malignant neoplasm of breast: Secondary | ICD-10-CM

## 2020-01-20 ENCOUNTER — Ambulatory Visit: Payer: Medicare Other

## 2020-01-22 ENCOUNTER — Other Ambulatory Visit: Payer: Self-pay | Admitting: Internal Medicine

## 2020-01-22 DIAGNOSIS — F32A Depression, unspecified: Secondary | ICD-10-CM

## 2020-01-27 ENCOUNTER — Ambulatory Visit: Payer: Medicare Other | Admitting: *Deleted

## 2020-01-27 DIAGNOSIS — E785 Hyperlipidemia, unspecified: Secondary | ICD-10-CM

## 2020-01-27 DIAGNOSIS — N183 Chronic kidney disease, stage 3 unspecified: Secondary | ICD-10-CM

## 2020-01-27 DIAGNOSIS — I10 Essential (primary) hypertension: Secondary | ICD-10-CM

## 2020-01-27 DIAGNOSIS — E1142 Type 2 diabetes mellitus with diabetic polyneuropathy: Secondary | ICD-10-CM

## 2020-01-27 NOTE — Patient Instructions (Signed)
Visit Information It was nice speaking with you today. Patient Care Plan: Diabetes Type 2 (Adult)    Problem Identified: Glycemic Management (Diabetes, Type 2)     Goal: Glycemic Management Optimized   Start Date: 05/24/2019  Expected End Date: 05/27/2020  This Visit's Progress: On track  Priority: High  Note:   Current Barriers:   Chronic Disease Management support, education, and care coordination needs related to HTN, HLD, DMII, CKD Stage 3, and Depression- Clinical Goal(s) related to  HTN, HLD, DMII, CKD Stage 3, and Depression- spoke with patient to complete follow up assessment , she states she is only taking 500 mg of regular Metformin twice daily with only occasional diarrhea, reports her appetite "comes and goes",  she recalls her most recent blood sugar reading taken by her son Ines Bloomer as 10, she says she would appreciate more Vanilla Glucerna samples from this CCM RN  Over the next 30 - 60 days, patient will:   Work with the care management team to address educational, disease management, and care coordination needs   Begin or continue self health monitoring activities as directed today Measure and record CBG (blood glucose) 2 times daily and take medications as prescribed  Call provider office for new or worsened signs and symptoms HTN, HLD, DMII, CKD Stage 3, and Depression  Call care management team with questions or concerns  Verbalize basic understanding of patient centered plan of care established today  Interventions related to HTN, HLD, DMII, CKD Stage 3, and Depression  Prior to interviewing patient, reviewed patient status, including review of recent office visit notes, consultants reports, relevant laboratory and other test results, and medications, completed.  Assessed patient's DM and HTN self management strategies and reviewed blood glucose home readings, assessed for hypoglycemic episodes  Assessed appetite and weight stability  Assessed need for more  Vanilla Glucerna samples - will deliver them to [facility] on 03/22/20  Reviewed medications with patient and discussed medication taking behavior  Discussed plans with patient for ongoing care management follow up and provided patient with direct contact information for care management team  Patient Self Care Activities related to HTN, HLD, DMII, CKD Stage 3, and Depression Patient is unable to independently self-manage chronic health conditions  Follow up: Care management will plan outreach to patient within 30-60 days     The patient verbalized understanding of instructions, educational materials, and care plan provided today and declined offer to receive copy of patient instructions, educational materials, and care plan.   The care management team will reach out to the patient again over the next 30-60 days.  Cranford Mon RN, CCM, [facility] RN Care Manager [phone]

## 2020-01-27 NOTE — Chronic Care Management (AMB) (Signed)
Chronic Care Management   Follow Up Note   01/27/2020 Name: Rowen Hur MRN: [id number] DOB: [birth year]  Referred by: Aldine Contes, MD Reason for referral : Chronic Care Management (NIDDM, HTN, HLD, CKD, depression)   Lindamarie Maclachlan is a 69 y.o. year old female who is a primary care patient of Aldine Contes, MD. The CCM team was consulted for assistance with chronic disease management and care coordination needs.    Review of patient status, including review of consultants reports, relevant laboratory and other test results, and collaboration with appropriate care team members and the patient's provider was performed as part of comprehensive patient evaluation and provision of chronic care management services.    SDOH (Social Determinants of Health) assessments performed: No See Care Plan activities for detailed interventions related to [facility])     Outpatient Encounter Medications as of 01/27/2020  Medication Sig Note   Accu-Chek Softclix Lancets lancets Check blood sugar 5 times a day as instructed    Acetaminophen 325 MG CAPS Take 325 mg by mouth every 6 (six) hours as needed (for pain).    ALPHAGAN P 0.1 % SOLN Place 1 drop into both eyes 3 (three) times daily. 04/17/2019: No record of this in past 6 months per external pharmacy records   Besifloxacin HCl (BESIVANCE) 0.6 % SUSP Besivance 0.6 % eye drops,suspension  INSTILL 1 DROP INTO THE LEFT EYE QID X 2 DAYS AFTER EACH MONTHLY EYE INJECTION 04/17/2019: No record of this in past 6 months per external pharmacy records   Blood Glucose Monitoring Suppl (ACCU-CHEK GUIDE ME) w/Device KIT Check blood sugar 5 times a day    busPIRone (BUSPAR) 5 MG tablet Take 1 tablet (5 mg total) by mouth 2 (two) times daily.    calcium citrate-vitamin D (CITRACAL+D) 315-200 MG-UNIT tablet Take 1 tablet by mouth 2 (two) times daily. (Patient not taking: Reported on 11/20/2017)    Cholecalciferol (VITAMIN D3) 2000 units TABS Take 2,000 Units  at bedtime by mouth.     ciprofloxacin (CILOXAN) 0.3 % ophthalmic ointment  04/17/2019: No record of this in past 6 months per external pharmacy records   cycloSPORINE (RESTASIS) 0.05 % ophthalmic emulsion Restasis MultiDose 0.05 % eye drops  INT 1 GTT IN [facility] EYE BID 04/17/2019: No record of this in past 6 months per external pharmacy records   erythromycin ophthalmic ointment APPLY A SMALL AMOUNT INTO LEFT EYE TID 04/17/2019: No record of this in past 6 months per external pharmacy records   glucose 4 GM chewable tablet Chew 4 tablets (16 g total) by mouth as needed for low blood sugar. 04/17/2019: No record of this in past 6 months per external pharmacy records   glucose blood (ACCU-CHEK GUIDE) test strip Check blood sugar 5 times per day    glucose blood test strip OneTouch Verio test strips  USE TO CHECK BS BID    Insulin Pen Needle (BD PEN NEEDLE NANO U/F) 32G X 4 MM MISC USE AS DIRECTED    ketorolac (ACULAR) 0.5 % ophthalmic solution Place 1 drop into both eyes 3 (three) times daily. 04/17/2019: No record of this in past 6 months per external pharmacy records   levothyroxine (SYNTHROID) 50 MCG tablet Take 1 tablet (50 mcg total) by mouth daily before breakfast.    loperamide (IMODIUM) 2 MG capsule Take 1 capsule (2 mg total) by mouth as needed for diarrhea or loose stools.    metFORMIN (GLUCOPHAGE) 500 MG tablet Take 1 tablet (500 mg total) by mouth 2 (  two) times daily with a meal.    Nutritional Supplements (GLUCERNA SNACK SHAKE) LIQD Take 1 Dose by mouth 3 (three) times daily as needed. 05/26/2019: She is drinking vanilla ensure instead of Glucerna   Olopatadine HCl (PATADAY) 0.2 % SOLN Place 1 drop into both eyes daily as needed (dry eyes).  04/17/2019: No record of this in past 6 months per external pharmacy records   Polyethyl Glycol-Propyl Glycol (SYSTANE) 0.4-0.3 % GEL ophthalmic gel Place 1 application daily as needed into both eyes (dry eyes/irritation).     potassium  chloride SA (KLOR-CON) 20 MEQ tablet Take 1 tablet (20 mEq total) by mouth daily for 3 days.    prednisoLONE acetate (PRED FORTE) 1 % ophthalmic suspension  04/17/2019: No record of this in past 6 months per external pharmacy records   rosuvastatin (CRESTOR) 20 MG tablet Take 1 tablet (20 mg total) by mouth daily.    Semaglutide,0.25 or 0.5MG/DOS, (OZEMPIC, 0.25 OR 0.5 MG/DOSE,) 2 MG/1.5ML SOPN Inject 0.25 mg into the skin once a week.    venlafaxine XR (EFFEXOR-XR) 150 MG 24 hr capsule TAKE 1 CAPSULE BY MOUTH DAILY WITH BREAKFAST    No facility-administered encounter medications on file as of 01/27/2020.     Objective:  Wt Readings from Last 3 Encounters:  10/19/19 112 lb 11.2 oz (51.1 kg)  08/17/19 108 lb 11.2 oz (49.3 kg)  07/21/19 109 lb 6.4 oz (49.6 kg)   BP Readings from Last 3 Encounters:  10/19/19 128/77  08/17/19 (!) 89/55  07/21/19 116/80   Lab Results  Component Value Date   HGBA1C 7.9 (A) 10/19/2019   HGBA1C 7.7 (A) 07/21/2019   HGBA1C 14.4 (H) 04/18/2019   Lab Results  Component Value Date   MICROALBUR 1.0 12/09/2013   LDLCALC 154 (H) 10/13/2018   CREATININE 0.89 06/30/2019    Goals Addressed              This Visit's Progress     Patient Stated     COMPLETED: "my blood sugar is much better and my GI symptoms are gone.". (pt-stated)        CARE PLAN ENTRY (see longitudinal plan of care for additional care plan information)  Current Barriers:   Chronic Disease Management support, education, and care coordination needs related to HTN, HLD, DMII, CKD Stage 3, and Depression- Clinical Goal(s) related to  HTN, HLD, DMII, CKD Stage 3, and Depression- spoke with patient to complete follow up assessment , she states she is only taking 500 mg of regular Metformin twice daily and the GI upset has subsided, denies diarrhea, she recalls her most recent blood sugar reading taken by her son [name] as 63, she says she received the [place] samples this CCM RN  left at her podiatrist office on 12/15/19 and expressed appreciation  Over the next 30 - 60 days, patient will:   Work with the care management team to address educational, disease management, and care coordination needs   Begin or continue self health monitoring activities as directed today Measure and record CBG (blood glucose) 2 times daily and take medications as prescribed  Call provider office for new or worsened signs and symptoms HTN, HLD, DMII, CKD Stage 3, and Depression  Call care management team with questions or concerns  Verbalize basic understanding of patient centered plan of care established today  Interventions related to HTN, HLD, DMII, CKD Stage 3, and Depression  Prior to interviewing patient, reviewed patient status, including review of recent office  visit notes, consultants reports, relevant laboratory and other test results, and medications, completed.  Assessed patient's DM and HTN self management strategies and reviewed blood glucose home readings, assessed for hypoglycemic episodes  Assessed appetite and weight stability  Ensured patient received Vanilla Glucerna samples and coupons left at [facility] on 12/15/19  Reviewed medications with patient and discussed medication taking behavior  Discussed plans with patient for ongoing care management follow up and provided patient with direct contact information for care management team  Reminded patient to schedule follow up appointment with Dr Jeffie Pollock, schedule annual eye exam and clinic appointment after the first of the year  Patient Self Care Activities related to HTN, HLD, DMII, CKD Stage 3, and Depression Patient is unable to independently self-manage chronic health conditions  Please see updated [place]      Set My Target A1C-Diabetes Type 2- " My son [name] checks my blood sugar and makes sure I eat the right kind of foods." (pt-stated)        Timeframe:  Long-Range Goal Priority:   High Start Date:       05/24/19                      Expected End Date:   3.31.22                    Follow Up Date 02/28/20   - set target A1C    Why is this important?    Your target A1C is decided together by you and your doctor.   It is based on several things like your age and other health issues.    Notes:        Patient Care Plan: Diabetes Type 2 (Adult)    Problem Identified: Glycemic Management (Diabetes, Type 2)     Goal: Glycemic Management Optimized   Start Date: 05/24/2019  Expected End Date: 05/27/2020  This Visit's Progress: On track  Priority: High  Note:   Current Barriers:   Chronic Disease Management support, education, and care coordination needs related to HTN, HLD, DMII, CKD Stage 3, and Depression- Clinical Goal(s) related to  HTN, HLD, DMII, CKD Stage 3, and Depression- spoke with patient to complete follow up assessment , she states she is only taking 500 mg of regular Metformin twice daily with only occasional diarrhea, reports her appetite "comes and goes",  she recalls her most recent blood sugar reading taken by her son [name] as 37, she says she would appreciate more Vanilla Glucerna samples from this CCM RN  Over the next 30 - 60 days, patient will:   Work with the care management team to address educational, disease management, and care coordination needs   Begin or continue self health monitoring activities as directed today Measure and record CBG (blood glucose) 2 times daily and take medications as prescribed  Call provider office for new or worsened signs and symptoms HTN, HLD, DMII, CKD Stage 3, and Depression  Call care management team with questions or concerns  Verbalize basic understanding of patient centered plan of care established today  Interventions related to HTN, HLD, DMII, CKD Stage 3, and Depression  Prior to interviewing patient, reviewed patient status, including review of recent office visit notes, consultants reports,  relevant laboratory and other test results, and medications, completed.  Assessed patient's DM and HTN self management strategies and reviewed blood glucose home readings, assessed for hypoglycemic episodes  Assessed appetite and weight stability  Assessed need for more Vanilla Glucerna samples - will deliver them to [facility] on 03/22/20  Reviewed medications with patient and discussed medication taking behavior  Discussed plans with patient for ongoing care management follow up and provided patient with direct contact information for care management team  Patient Self Care Activities related to HTN, HLD, DMII, CKD Stage 3, and Depression Patient is unable to independently self-manage chronic health conditions  Follow up: Care management will plan outreach to patient within 30-60 days     Plan:   The care management team will reach out to the patient again over the next 30-60 days.    Kelli Churn RN, CCM, [facility] RN Care Manager [phone]

## 2020-02-23 ENCOUNTER — Telehealth: Payer: Self-pay

## 2020-02-23 NOTE — Telephone Encounter (Signed)
busPIRone (BUSPAR) 5 MG tablet,    rosuvastatin (CRESTOR) 20 MG tablet   refill request @  [facility], [address] [place], [address] Phone:  [phone]  Fax:  [phone]

## 2020-02-23 NOTE — Telephone Encounter (Signed)
Called / informed pt to call [facility] since she has refills on Buspar and Crestor (written 11/19/19 #90 x 3 refills). Stated she will.

## 2020-02-25 ENCOUNTER — Other Ambulatory Visit: Payer: Self-pay | Admitting: Internal Medicine

## 2020-02-25 ENCOUNTER — Telehealth: Payer: Medicare Other

## 2020-02-25 DIAGNOSIS — F32A Depression, unspecified: Secondary | ICD-10-CM

## 2020-02-28 ENCOUNTER — Other Ambulatory Visit: Payer: Self-pay | Admitting: Internal Medicine

## 2020-02-28 ENCOUNTER — Other Ambulatory Visit: Payer: Self-pay

## 2020-02-28 ENCOUNTER — Ambulatory Visit
Admission: RE | Admit: 2020-02-28 | Discharge: 2020-02-28 | Disposition: A | Discharge status: Home / Self Care | Payer: Medicare Other | Source: Ambulatory Visit | Attending: Internal Medicine | Admitting: Internal Medicine

## 2020-02-28 DIAGNOSIS — Z1231 Encounter for screening mammogram for malignant neoplasm of breast: Secondary | ICD-10-CM | POA: Diagnosis not present

## 2020-02-29 ENCOUNTER — Ambulatory Visit: Payer: Medicare Other | Admitting: *Deleted

## 2020-02-29 DIAGNOSIS — N183 Chronic kidney disease, stage 3 unspecified: Secondary | ICD-10-CM

## 2020-02-29 DIAGNOSIS — E1142 Type 2 diabetes mellitus with diabetic polyneuropathy: Secondary | ICD-10-CM

## 2020-02-29 DIAGNOSIS — I10 Essential (primary) hypertension: Secondary | ICD-10-CM

## 2020-02-29 DIAGNOSIS — E039 Hypothyroidism, unspecified: Secondary | ICD-10-CM

## 2020-02-29 DIAGNOSIS — E785 Hyperlipidemia, unspecified: Secondary | ICD-10-CM

## 2020-02-29 NOTE — Patient Instructions (Signed)
Visit Information It was nice speaking with you today.   Patient Care Plan: Diabetes Type 2 (Adult)    Problem Identified: Glycemic Management (Diabetes, Type 2)     Goal: Glycemic Management Optimized   Start Date: 05/24/2019  Expected End Date: 05/27/2020  Recent Progress: On track  Priority: High  Note:   Current Barriers:  . Chronic Disease Management support, education, and care coordination needs related to HTN, HLD, DMII, CKD Stage 3, and Depression- Clinical Goal(s) related to  HTN, HLD, DMII, CKD Stage 3, and Depression- spoke with patient to complete follow up assessment , she states her blood suagrs are doing "Ok" , reports weight has stabilized and appetite "about the same"  Over the next 30 - 60 days, patient will:  . Work with the care management team to address educational, disease management, and care coordination needs  . Begin or continue self health monitoring activities as directed today Measure and record CBG (blood glucose) 2 times daily and take medications as prescribed . Call provider office for new or worsened signs and symptoms HTN, HLD, DMII, CKD Stage 3, and Depression . Call care management team with questions or concerns . Verbalize basic understanding of patient centered plan of care established today  Interventions related to HTN, HLD, DMII, CKD Stage 3, and Depression . Prior to interviewing patient, reviewed patient status, including review of recent office visit notes, consultants reports, relevant laboratory and other test results, and medications, completed. . Assessed patient's DM and HTN self management strategies and reviewed blood glucose home readings, assessed for hypoglycemic episodes . Assessed appetite and weight stability . Reviewed medications with patient and discussed medication taking behavior . Discussed plans with patient for ongoing care management follow up and provided patient with direct contact information for care management  team  Patient Self Care Activities related to HTN, HLD, DMII, CKD Stage 3, and Depression Patient is unable to independently self-manage chronic health conditions  Follow up: Care management will plan outreach to patient within 30-60 days   Patient Care Plan: [place] (Adult)    Problem Identified: CCM RN- patient c/o new symptoms of urinary frequency and burning . Also requesting clarification of levothyroxine dose   Priority: High  Onset Date: 02/29/2020    Goal: Patient-Specific Goal- Patient will call urologist office and make urgent office visit to have dysuria and frequency assessed and treated. Pt will receive clarification of levothyroxine dose.   Start Date: 02/29/2020  Expected End Date: 03/06/2020  This Visit's Progress: On track  Priority: High  Note:   Current Barriers:  . Care Coordination needs related to securing acute urology appointment and clarifying levothyroxine dose in a patient with NIDDM, HTN, HLD, CKD, hypothyroidism, depression- pt c/o urinary frequency and burning and darker urine, denies fever, chills, states son picked up refill on levothyroxine and the dose on the bottle is 88 mcg not 50 mcg which is what she was taking before . Unable to independently problem solve r/t urinary symptoms and med dosage  Nurse Case Manager Clinical Goal(s):  [name] Over the next 30 days, patient will verbalize understanding of plan to secure urology appointment and receive clarification of levothyroxine  Interventions:  . 1:1 collaboration with Aldine Contes, MD regarding development and update of comprehensive plan of care as evidenced by provider attestation and co-signature . Inter-disciplinary care team collaboration (see longitudinal plan of care) . Assessed symptoms and provided education to patient about probable cause of urinary symptoms and  importance of making acute appointment with urologist . Ensured patient has urology office number . Encouraged  patient to drink plenty of water . Advised patient this CCM RN will contact provider regarding correct dose of levothyroxine . Messaged provider requesting clarification of levothyroxine dose  Patient Goals/Self-Care Activities Over the next **30* days, patient will:  - Patient will self administer medications as prescribed Patient will attend all scheduled provider appointments Patient will call pharmacy for medication refills Patient will call provider office for new concerns or questions Make and complete urgent urology appointment  Receive correct dose of levothyroxine  Follow Up Plan: The care management team will reach out to the patient again over the next 30-60 days.           The patient verbalized understanding of instructions, educational materials, and care plan provided today and declined offer to receive copy of patient instructions, educational materials, and care plan.     Kelli Churn RN, CCM, [facility] RN Care Manager [phone]

## 2020-02-29 NOTE — Chronic Care Management (AMB) (Signed)
Chronic Care Management   CCM RN Visit Note  02/29/2020 Name: [name] MRN: [id number] DOB: 1950/04/21  Subjective: [name] is a 70 y.o. year old female who is a primary care patient of Aldine Contes, MD. The care management team was consulted for assistance with disease management and care coordination needs.    Engaged with patient by telephone for follow up visit in response to provider referral for case management and/or care coordination services.   Consent to Services:  The patient was given information about Chronic Care Management services, agreed to services, and gave verbal consent prior to initiation of services.  Please see initial visit note for detailed documentation.   Patient agreed to services and verbal consent obtained.   Assessment: Review of patient past medical history, allergies, medications, health status, including review of consultants reports, laboratory and other test data, was performed as part of comprehensive evaluation and provision of chronic care management services.   SDOH (Social Determinants of Health) assessments and interventions performed:    CCM Care Plan  Allergies  Allergen Reactions  . Gabapentin Other (See Comments)    Dizziness from 300 mg, tolerates 100 mg Dizziness from 300 mg, tolerates 100 mg   . Meclizine Hcl Rash  . Metformin Hcl Er Diarrhea    Patient did not tolerate Metformin ER but is able to take lower dose of Metformin  . Penicillins Rash    Has patient had a PCN reaction causing immediate rash, facial/tongue/throat swelling, SOB or lightheadedness with hypotension: Yes Has patient had a PCN reaction causing severe rash involving mucus membranes or skin necrosis: No Has patient had a PCN reaction that required hospitalization: pt was in hospital at time of reaction Has patient had a PCN reaction occurring within the last 10 years: No If all of the above answers are "NO", then may proceed with Cephalosporin  use.  . Sulfa Antibiotics Rash    Outpatient Encounter Medications as of 02/29/2020  Medication Sig Note  . Accu-Chek Softclix Lancets lancets Check blood sugar 5 times a day as instructed   . Acetaminophen 325 MG CAPS Take 325 mg by mouth every 6 (six) hours as needed (for pain).   . ALPHAGAN P 0.1 % SOLN Place 1 drop into both eyes 3 (three) times daily. 04/17/2019: No record of this in past 6 months per external pharmacy records  . Besifloxacin HCl (BESIVANCE) 0.6 % SUSP Besivance 0.6 % eye drops,suspension  INSTILL 1 DROP INTO THE LEFT EYE QID X 2 DAYS AFTER EACH MONTHLY EYE INJECTION 04/17/2019: No record of this in past 6 months per external pharmacy records  . Blood Glucose Monitoring Suppl (ACCU-CHEK GUIDE ME) w/Device KIT Check blood sugar 5 times a day   . busPIRone (BUSPAR) 5 MG tablet Take 1 tablet (5 mg total) by mouth 2 (two) times daily.   . calcium citrate-vitamin D (CITRACAL+D) 315-200 MG-UNIT tablet Take 1 tablet by mouth 2 (two) times daily. (Patient not taking: Reported on 11/20/2017)   . Cholecalciferol (VITAMIN D3) 2000 units TABS Take 2,000 Units at bedtime by mouth.    . ciprofloxacin (CILOXAN) 0.3 % ophthalmic ointment  04/17/2019: No record of this in past 6 months per external pharmacy records  . cycloSPORINE (RESTASIS) 0.05 % ophthalmic emulsion Restasis MultiDose 0.05 % eye drops  INT 1 GTT IN [facility] EYE BID 04/17/2019: No record of this in past 6 months per external pharmacy records  . erythromycin ophthalmic ointment APPLY A SMALL AMOUNT INTO LEFT EYE TID  04/17/2019: No record of this in past 6 months per external pharmacy records  . glucose 4 GM chewable tablet Chew 4 tablets (16 g total) by mouth as needed for low blood sugar. 04/17/2019: No record of this in past 6 months per external pharmacy records  . glucose blood (ACCU-CHEK GUIDE) test strip Check blood sugar 5 times per day   . glucose blood test strip OneTouch Verio test strips  USE TO CHECK BS BID   . Insulin  Pen Needle (BD PEN NEEDLE NANO U/F) 32G X 4 MM MISC USE AS DIRECTED   . ketorolac (ACULAR) 0.5 % ophthalmic solution Place 1 drop into both eyes 3 (three) times daily. 04/17/2019: No record of this in past 6 months per external pharmacy records  . levothyroxine (SYNTHROID) 50 MCG tablet Take 1 tablet (50 mcg total) by mouth daily before breakfast.   . loperamide (IMODIUM) 2 MG capsule Take 1 capsule (2 mg total) by mouth as needed for diarrhea or loose stools.   . metFORMIN (GLUCOPHAGE) 500 MG tablet Take 1 tablet (500 mg total) by mouth 2 (two) times daily with a meal.   . Nutritional Supplements (GLUCERNA SNACK SHAKE) LIQD Take 1 Dose by mouth 3 (three) times daily as needed. 05/26/2019: She is drinking vanilla ensure instead of Glucerna  . Olopatadine HCl (PATADAY) 0.2 % SOLN Place 1 drop into both eyes daily as needed (dry eyes).  04/17/2019: No record of this in past 6 months per external pharmacy records  . Polyethyl Glycol-Propyl Glycol (SYSTANE) 0.4-0.3 % GEL ophthalmic gel Place 1 application daily as needed into both eyes (dry eyes/irritation).    . potassium chloride SA (KLOR-CON) 20 MEQ tablet Take 1 tablet (20 mEq total) by mouth daily for 3 days.   . prednisoLONE acetate (PRED FORTE) 1 % ophthalmic suspension  04/17/2019: No record of this in past 6 months per external pharmacy records  . rosuvastatin (CRESTOR) 20 MG tablet Take 1 tablet (20 mg total) by mouth daily.   . Semaglutide,0.25 or 0.5MG/DOS, (OZEMPIC, 0.25 OR 0.5 MG/DOSE,) 2 MG/1.5ML SOPN Inject 0.25 mg into the skin once a week.   . venlafaxine XR (EFFEXOR-XR) 150 MG 24 hr capsule TAKE 1 CAPSULE BY MOUTH  DAILY WITH BREAKFAST    No facility-administered encounter medications on file as of 02/29/2020.    Patient Active Problem List   Diagnosis Date Noted  . Vitamin D deficiency 08/17/2019  . Weight loss 08/17/2019  . Carpal tunnel syndrome 06/01/2019  . Cognitive impairment 01/13/2019  . Insomnia 06/11/2018  . Hydronephrosis  04/01/2017  . Acquired scoliosis 02/04/2017  . Degeneration of lumbar intervertebral disc 02/04/2017  . Osteoarthritis of facet joint of lumbar spine 02/04/2017  . Sciatica of left side without back pain 01/30/2017  . Neuropathy 12/09/2016  . Genetic testing 11/27/2015  . Primary osteoarthritis of first carpometacarpal joint of right hand 07/06/2015  . Preventative health care 08/26/2012  . HTN, goal below 140/90 08/13/2012  . CKD (chronic kidney disease) stage 3, GFR 30-59 ml/min (HCC) 08/13/2012  . Glaucoma associated with systemic syndromes(365.44) 08/07/2012  . Hypothyroidism 04/16/2006  . Anemia 04/16/2006  . Depression 04/16/2006  . LOW BACK PAIN 04/16/2006  . BREAST CANCER, HX OF 04/16/2006  . Hyperlipidemia 10/13/2003  . Type 2 diabetes mellitus with diabetic polyneuropathy, with long-term current use of insulin ([place]) 04/16/1991    Conditions to be addressed/monitored:NIDDM, HTN, CKD, Hypothroidism  Care Plan : Diabetes Type 2 (Adult)  Updates made by Barrington Ellison,  RN since 02/29/2020 12:00 AM    Problem: Glycemic Management (Diabetes, Type 2)     Goal: Glycemic Management Optimized   Start Date: 05/24/2019  Expected End Date: 05/27/2020  Recent Progress: On track  Priority: High  Note:   Current Barriers:  . Chronic Disease Management support, education, and care coordination needs related to HTN, HLD, DMII, CKD Stage 3, and Depression- Clinical Goal(s) related to  HTN, HLD, DMII, CKD Stage 3, and Depression- spoke with patient to complete follow up assessment , she states her blood suagrs are doing "Ok" , reports weight has stabilized and appetite "about the same"  Over the next 30 - 60 days, patient will:  . Work with the care management team to address educational, disease management, and care coordination needs  . Begin or continue self health monitoring activities as directed today Measure and record CBG (blood glucose) 2 times daily and take medications as  prescribed . Call provider office for new or worsened signs and symptoms HTN, HLD, DMII, CKD Stage 3, and Depression . Call care management team with questions or concerns . Verbalize basic understanding of patient centered plan of care established today  Interventions related to HTN, HLD, DMII, CKD Stage 3, and Depression . Prior to interviewing patient, reviewed patient status, including review of recent office visit notes, consultants reports, relevant laboratory and other test results, and medications, completed. . Assessed patient's DM and HTN self management strategies and reviewed blood glucose home readings, assessed for hypoglycemic episodes . Assessed appetite and weight stability . Reviewed medications with patient and discussed medication taking behavior . Discussed plans with patient for ongoing care management follow up and provided patient with direct contact information for care management team  Patient Self Care Activities related to HTN, HLD, DMII, CKD Stage 3, and Depression Patient is unable to independently self-manage chronic health conditions  Follow up: Care management will plan outreach to patient within 30-60 days   Care Plan : [place] (Adult)  Updates made by Barrington Ellison, RN since 02/29/2020 12:00 AM    Problem: CCM RN- patient c/o new symptoms of urinary frequency and burning . Also requesting clarification of levothyroxine dose   Priority: High  Onset Date: 02/29/2020    Goal: Patient-Specific Goal- Patient will call urologist office and make urgent office visit to have dysuria and frequency assessed and treated. Pt will receive clarification of levothyroxine dose.   Start Date: 02/29/2020  Expected End Date: 03/06/2020  This Visit's Progress: On track  Priority: High  Note:   Current Barriers:  . Care Coordination needs related to securing acute urology appointment and clarifying levothyroxine dose in a patient with NIDDM, HTN, HLD, CKD,  hypothyroidism, depression- pt c/o urinary frequency and burning and darker urine, denies fever, chills. Also,  patient states states son picked up refill on levothyroxine today and the dose on the bottle is 88 mcg not 50 mcg which is what she was taking before . Unable to independently problem solve r/t urinary symptoms and med dosage  Nurse Case Manager Clinical Goal(s):  [name] Over the next 30 days, patient will verbalize understanding of plan to secure urology appointment and receive clarification of levothyroxine  Interventions:  . 1:1 collaboration with Aldine Contes, MD regarding development and update of comprehensive plan of care as evidenced by provider attestation and co-signature . Inter-disciplinary care team collaboration (see longitudinal plan of care) . Assessed symptoms and provided education to patient about probable cause of  urinary symptoms and importance of making acute appointment with urologist . Ensured patient has urology office number . Encouraged patient to drink plenty of water . Advised patient this CCM RN will contact provider regarding correct dose of levothyroxine . Messaged provider re: correct dose of levothyroxine  Patient Goals/Self-Care Activities Over the next **30* days, patient will:  - Patient will self administer medications as prescribed Patient will attend all scheduled provider appointments Patient will call pharmacy for medication refills Patient will call provider office for new concerns or questions Make and complete urgent urology appointment  Receive correct dose of levothyroxine  Follow Up Plan: The care management team will reach out to the patient again over the next 30-60 days.           Kelli Churn RN, CCM, [facility] RN Care Manager [phone]

## 2020-03-01 MED ORDER — LEVOTHYROXINE SODIUM 50 MCG PO TABS
50.0000 ug | ORAL_TABLET | Freq: Every day | ORAL | 0 refills | Status: DC
Start: 2020-03-01 — End: 2020-03-24

## 2020-03-01 NOTE — Progress Notes (Signed)
Internal Medicine Clinic Resident  Patient should be on a 50 mcg dose per or most recent documentation.  She reportedly received 88 mcg from her pharmacy most recently.  I have resent the prescription with the correct dose and discussed this with the patient by telephone.  I have personally reviewed this encounter including the documentation in this note and/or discussed this patient with the care management provider. I will address any urgent items identified by the care management provider and will communicate my actions to the patient's PCP. I have reviewed the patient's CCM visit with my supervising attending, Dr Dareen Piano.  Andrew Au, MD 03/01/2020

## 2020-03-01 NOTE — Addendum Note (Signed)
Addended by: Andrew Au on: 03/01/2020 10:43 AM   Modules accepted: Orders

## 2020-03-02 NOTE — Progress Notes (Signed)
Internal Medicine Clinic Attending  CCM services provided by the care management provider and their documentation were discussed with Dr. Bridgett Larsson. We reviewed the pertinent findings, urgent action items addressed by the resident and non-urgent items to be addressed by the PCP.  I agree with the assessment, diagnosis, and plan of care documented in the CCM and resident's note.  Aldine Contes, MD 03/02/2020

## 2020-03-06 ENCOUNTER — Other Ambulatory Visit: Payer: Self-pay

## 2020-03-06 DIAGNOSIS — Z794 Long term (current) use of insulin: Secondary | ICD-10-CM

## 2020-03-06 DIAGNOSIS — E1142 Type 2 diabetes mellitus with diabetic polyneuropathy: Secondary | ICD-10-CM

## 2020-03-06 MED ORDER — OZEMPIC (0.25 OR 0.5 MG/DOSE) 2 MG/1.5ML ~~LOC~~ SOPN
0.2500 mg | PEN_INJECTOR | SUBCUTANEOUS | 6 refills | Status: DC
Start: 2020-03-06 — End: 2020-06-20

## 2020-03-06 NOTE — Telephone Encounter (Signed)
Semaglutide,0.25 or 0.5MG /DOS, (OZEMPIC, 0.25 OR 0.5 MG/DOSE,) 2 MG/1.5ML SOPN, REFILL REQUEST @  [facility] [place], [place] - [address] Phone:  [phone]  Fax:  [phone]

## 2020-03-20 ENCOUNTER — Telehealth: Payer: Self-pay | Admitting: Internal Medicine

## 2020-03-20 DIAGNOSIS — N1330 Unspecified hydronephrosis: Secondary | ICD-10-CM

## 2020-03-20 NOTE — Telephone Encounter (Signed)
Thank you so much. I will go ahead and send it over now.

## 2020-03-20 NOTE — Telephone Encounter (Signed)
I placed a referral but the symptoms she complained about are likely secondary to a UTI and could have been handled in our office. She did need follow up with urology for her non specific hydronephrosis so I went ahead and placed it

## 2020-03-20 NOTE — Telephone Encounter (Signed)
Rec'd a Call from [facility].  This patient spoke with Kelli Churn with CCM  on 02/29/2020.Christy Kitchenand was intructed to call  [facility] to schedule a new appointment. We have no referrals on file and will need a Referral placed in Epic in order to send notes out as this patient has not seen [facility] in a very long time  (2018) and has not had an Office visit in the clinic since 09/2019.  Please Advise if a New referral can be placed ASAP as her appointment is this week.

## 2020-03-22 ENCOUNTER — Encounter: Payer: Self-pay | Admitting: Podiatry

## 2020-03-22 ENCOUNTER — Other Ambulatory Visit: Payer: Self-pay

## 2020-03-22 ENCOUNTER — Ambulatory Visit (INDEPENDENT_AMBULATORY_CARE_PROVIDER_SITE_OTHER): Payer: Medicare Other | Admitting: Podiatry

## 2020-03-22 ENCOUNTER — Telehealth: Payer: Self-pay | Admitting: *Deleted

## 2020-03-22 DIAGNOSIS — E1149 Type 2 diabetes mellitus with other diabetic neurological complication: Secondary | ICD-10-CM

## 2020-03-22 DIAGNOSIS — B351 Tinea unguium: Secondary | ICD-10-CM | POA: Diagnosis not present

## 2020-03-22 DIAGNOSIS — L84 Corns and callosities: Secondary | ICD-10-CM

## 2020-03-22 DIAGNOSIS — M2010 Hallux valgus (acquired), unspecified foot: Secondary | ICD-10-CM

## 2020-03-22 DIAGNOSIS — N183 Chronic kidney disease, stage 3 unspecified: Secondary | ICD-10-CM

## 2020-03-22 NOTE — Telephone Encounter (Signed)
Entered in error

## 2020-03-22 NOTE — Addendum Note (Signed)
Addended by: Hulan Fray on: 03/22/2020 05:57 PM   Modules accepted: Orders

## 2020-03-22 NOTE — Progress Notes (Signed)
This patient returns to my office for at risk foot care.  This patient requires this care by a professional since this patient will be at risk due to having diabetes and chronic kidney disease.    This patient is unable to cut nails herself since the patient cannot reach her nails.These nails are painful walking and wearing shoes.   Patient has callus under her big toe joint left foot. This patient presents for at risk foot care today.  General Appearance  Alert, conversant and in no acute stress.  Vascular  Dorsalis pedis and posterior tibial  pulses are palpable  bilaterally.  Capillary return is within normal limits  bilaterally. Temperature is within normal limits  bilaterally.  Neurologic  Senn-Weinstein monofilament wire test diminished  bilaterally. Muscle power within normal limits bilaterally.  Nails Thick disfigured discolored nails with subungual debris  from hallux to fifth toes bilaterally. No evidence of bacterial infection or drainage bilaterally.  Orthopedic  No limitations of motion  feet .  No crepitus or effusions noted.  No bony pathology or digital deformities noted.  Skin  normotropic skin with no porokeratosis noted bilaterally.  No signs of infections or ulcers noted.  Callus sub 1st MPJ left foot.   Onychomycosis  Pain in right toes  Pain in left toes  Porokeratosis left forefoot.  Consent was obtained for treatment procedures.   Mechanical debridement of nails 1-5  bilaterally performed with a nail nipper.  Filed with dremel without incident. Debridement of callus with # 15 blade.   Return office visit  3 months                    Told patient to return for periodic foot care and evaluation due to potential at risk complications.   [name] DPM

## 2020-03-24 ENCOUNTER — Other Ambulatory Visit: Payer: Self-pay | Admitting: Internal Medicine

## 2020-03-24 DIAGNOSIS — E039 Hypothyroidism, unspecified: Secondary | ICD-10-CM

## 2020-03-28 ENCOUNTER — Ambulatory Visit: Payer: Medicare Other | Admitting: *Deleted

## 2020-03-28 DIAGNOSIS — Z794 Long term (current) use of insulin: Secondary | ICD-10-CM

## 2020-03-28 DIAGNOSIS — E785 Hyperlipidemia, unspecified: Secondary | ICD-10-CM

## 2020-03-28 DIAGNOSIS — N1330 Unspecified hydronephrosis: Secondary | ICD-10-CM

## 2020-03-28 DIAGNOSIS — E039 Hypothyroidism, unspecified: Secondary | ICD-10-CM

## 2020-03-28 DIAGNOSIS — N183 Chronic kidney disease, stage 3 unspecified: Secondary | ICD-10-CM

## 2020-03-28 DIAGNOSIS — E1142 Type 2 diabetes mellitus with diabetic polyneuropathy: Secondary | ICD-10-CM

## 2020-03-28 NOTE — Patient Instructions (Signed)
Visit Information It was nice speaking with you today. PATIENT GOALS: Patient Care Plan: Diabetes Type 2 (Adult)    Problem Identified: Glycemic Management (Diabetes, Type 2)     Goal: Glycemic Management Optimized   Start Date: 05/24/2019  Expected End Date: 05/27/2020  Recent Progress: On track  Priority: High  Note:   Current Barriers:  . Chronic Disease Management support, education, and care coordination needs related to HTN, HLD, DMII, CKD Stage 3, and Depression- Clinical Goal(s) related to  HTN, HLD, DMII, CKD Stage 3, and Depression- spoke with patient to complete follow up assessment , she states her blood suagrs are doing "Ok" , says when [place] checks them they are all in low to mid 100's,  reports weight has stabilized and appetite "about the same"  Over the next 30 - 60 days, patient will:  . Work with the care management team to address educational, disease management, and care coordination needs  . Begin or continue self health monitoring activities as directed today Measure and record CBG (blood glucose) 2 times daily and take medications as prescribed . Call provider office for new or worsened signs and symptoms HTN, HLD, DMII, CKD Stage 3, and Depression . Call care management team with questions or concerns . Verbalize basic understanding of patient centered plan of care established today  Interventions related to HTN, HLD, DMII, CKD Stage 3, and Depression . Prior to interviewing patient, reviewed patient status, including review of recent office visit notes, consultants reports, relevant laboratory and other test results, and medications, completed. . Assessed patient's DM and HTN self management strategies and reviewed blood glucose home readings, assessed for hypoglycemic episodes . Assessed appetite and weight stability . Reviewed medications with patient and discussed medication taking behavior . Discussed plans with patient for ongoing care management follow up and  provided patient with direct contact information for care management team  Patient Self Care Activities related to HTN, HLD, DMII, CKD Stage 3, and Depression Patient is unable to independently self-manage chronic health conditions  Follow up: Care management will plan outreach to patient within 30-60 days   Patient Care Plan: [place] (Adult)    Problem Identified: CCM RN- patient requesting assistance with getting inconitience supplies covered by Medicaid   Priority: High  Onset Date: 02/29/2020    Goal: Patient-Specific Goal- Patient will call urologist office and make urgent office visit to have dysuria and frequency assessed and treated. Pt will receive clarification of levothyroxine dose. Completed 03/28/2020  Start Date: 02/29/2020  Expected End Date: 03/06/2020  Recent Progress: On track  Priority: High  Note:   Current Barriers:  . Care Coordination needs related to securing acute urology appointment and clarifying levothyroxine dose in a patient with NIDDM, HTN, HLD, CKD, hypothyroidism, depression- pt c/o urinary frequency and burning and darker urine, denies fever, chills, states son picked up refill on levothyroxine and the dose on the bottle is 88 mcg not 50 mcg which is what she was taking before . Unable to independently problem solve r/t urinary symptoms and med dosage  Nurse Case Manager Clinical Goal(s):  [name] Over the next 30 days, patient will verbalize understanding of plan to secure urology appointment and receive clarification of levothyroxine  Interventions:  . 1:1 collaboration with Aldine Contes, MD regarding development and update of comprehensive plan of care as evidenced by provider attestation and co-signature . Inter-disciplinary care team collaboration (see longitudinal plan of care) . Assessed symptoms and provided education to  patient about probable cause of urinary symptoms and importance of making acute appointment with  urologist . Ensured patient has urology office number . Encouraged patient to drink plenty of water . Advised patient this CCM RN will contact provider regarding correct dose of levothyroxine . Messaged provider requesting clarification of levothyroxine dose- Dr Bridgett Larsson called pt and clarifed with her tha tdose should be 50 mcg and sent another Rx in to reflect the correct dose. . Per urology office (Dr. Jeffie Pollock) patient has appointment on 03/29/20   Patient Goals/Self-Care Activities Over the next **30* days, patient will:  - Patient will self administer medications as prescribed Patient will attend all scheduled provider appointments Patient will call pharmacy for medication refills Patient will call provider office for new concerns or questions Make and complete urgent urology appointment  Receive correct dose of levothyroxine  Follow Up Plan: The care management team will reach out to the patient again over the next 30-60 days.        Goal: Patient will receive assistance in getting incontinence supplies covered by Medicaid   Start Date: 03/28/2020  Expected End Date: 05/23/2020  This Visit's Progress: On track  Priority: Low  Note:   Current Barriers:  . Care Coordination needs related to needed DME/Supplies in a patient with urinary incontinence . Care Coordination needs related to securing incontinence supplies in a patient with urinary incontinence  Nurse Case Manager Clinical Goal(s):  [name] Over the next 30-60 days, patient will verbalize understanding of plan to obtain needed DME.  . Over the next 30-60 days, patient will work with CCM RN and/or  [employer]  to address needs related to DME needs. . patient will work with CCM RN and [employer] to address needs related to urinary incontinence supplies  Interventions:  . Inter-disciplinary care team collaboration (see longitudinal plan of care) . Collaboration with/confirmation of receipt of DME orders at  [employer] . Advised patient CCM  RN will contact provider regarding securing order  . Collaborated with [employer], urology office and clinic provider regarding urinary incontinence supplies paperwork completion and need for recent office visit notes addressing urinary incontinence  . Collaboration with PCP regarding development and update of comprehensive plan of care as evidenced by provider attestation and co-signature . Reminded patient of her appointment with urologist on 03/29/20 . Inter-disciplinary care team collaboration (see longitudinal plan of care) Patient Self Care Activities:  . Patient will attend all scheduled provider appointments . Patient will work with health  are team in securing urinary incontinence supplies and reimbursed via Medicaid . Unable to independently secure urinary incontinence supplies under Medicaid reimbursement benefit     The patient verbalized understanding of instructions, educational materials, and care plan provided today and declined offer to receive copy of patient instructions, educational materials, and care plan.   The care management team will reach out to the patient again over the next 30-60 days.   Kelli Churn RN, CCM, [facility] RN Care Manager [phone]

## 2020-03-28 NOTE — Progress Notes (Signed)
Internal Medicine Clinic Resident  I have personally reviewed this encounter including the documentation in this note and/or discussed this patient with the care management provider. I will address any urgent items identified by the care management provider and will communicate my actions to the patient's PCP. I have reviewed the patient's CCM visit with my supervising attending, Dr Guilloud.  Jarious Lyon K Christina Waldrop, MD 03/28/2020    

## 2020-03-28 NOTE — Chronic Care Management (AMB) (Signed)
Chronic Care Management   CCM RN Visit Note  03/28/2020 Name: [name] MRN: [id number] DOB: [birth year]  Subjective: [name] is a 70 y.o. year old female who is a primary care patient of Aldine Contes, MD. The care management team was consulted for assistance with disease management and care coordination needs.    Engaged with patient by telephone for follow up visit in response to provider referral for case management and/or care coordination services.   Consent to Services:  The patient was given information about Chronic Care Management services, agreed to services, and gave verbal consent prior to initiation of services.  Please see initial visit note for detailed documentation.   Patient agreed to services and verbal consent obtained.   Assessment: Review of patient past medical history, allergies, medications, health status, including review of consultants reports, laboratory and other test data, was performed as part of comprehensive evaluation and provision of chronic care management services.   SDOH (Social Determinants of Health) assessments and interventions performed:    CCM Care Plan  Allergies  Allergen Reactions  . Gabapentin Other (See Comments)    Dizziness from 300 mg, tolerates 100 mg Dizziness from 300 mg, tolerates 100 mg   . Meclizine Hcl Rash  . Metformin Hcl Er Diarrhea    Patient did not tolerate Metformin ER but is able to take lower dose of Metformin  . Penicillins Rash    Has patient had a PCN reaction causing immediate rash, facial/tongue/throat swelling, SOB or lightheadedness with hypotension: Yes Has patient had a PCN reaction causing severe rash involving mucus membranes or skin necrosis: No Has patient had a PCN reaction that required hospitalization: pt was in hospital at time of reaction Has patient had a PCN reaction occurring within the last 10 years: No If all of the above answers are "NO", then may proceed with Cephalosporin  use.  . Sulfa Antibiotics Rash    Outpatient Encounter Medications as of 03/28/2020  Medication Sig Note  . Accu-Chek Softclix Lancets lancets Check blood sugar 5 times a day as instructed   . Acetaminophen 325 MG CAPS Take 325 mg by mouth every 6 (six) hours as needed (for pain).   . ALPHAGAN P 0.1 % SOLN Place 1 drop into both eyes 3 (three) times daily. 04/17/2019: No record of this in past 6 months per external pharmacy records  . Besifloxacin HCl 0.6 % SUSP Besivance 0.6 % eye drops,suspension  INSTILL 1 DROP INTO THE LEFT EYE QID X 2 DAYS AFTER EACH MONTHLY EYE INJECTION 04/17/2019: No record of this in past 6 months per external pharmacy records  . Blood Glucose Monitoring Suppl (ACCU-CHEK GUIDE ME) w/Device KIT Check blood sugar 5 times a day   . busPIRone (BUSPAR) 5 MG tablet Take 1 tablet (5 mg total) by mouth 2 (two) times daily.   . calcium citrate-vitamin D (CITRACAL+D) 315-200 MG-UNIT tablet Take 1 tablet by mouth 2 (two) times daily. (Patient not taking: Reported on 11/20/2017)   . Cholecalciferol (VITAMIN D3) 2000 units TABS Take 2,000 Units at bedtime by mouth.    . ciprofloxacin (CILOXAN) 0.3 % ophthalmic ointment  04/17/2019: No record of this in past 6 months per external pharmacy records  . cycloSPORINE (RESTASIS) 0.05 % ophthalmic emulsion Restasis MultiDose 0.05 % eye drops  INT 1 GTT IN [facility] EYE BID 04/17/2019: No record of this in past 6 months per external pharmacy records  . erythromycin ophthalmic ointment APPLY A SMALL AMOUNT INTO LEFT EYE TID 04/17/2019:  No record of this in past 6 months per external pharmacy records  . glucose 4 GM chewable tablet Chew 4 tablets (16 g total) by mouth as needed for low blood sugar. 04/17/2019: No record of this in past 6 months per external pharmacy records  . glucose blood (ACCU-CHEK GUIDE) test strip Check blood sugar 5 times per day   . glucose blood test strip OneTouch Verio test strips  USE TO CHECK BS BID   . Insulin Pen Needle (BD  PEN NEEDLE NANO U/F) 32G X 4 MM MISC USE AS DIRECTED   . ketorolac (ACULAR) 0.5 % ophthalmic solution Place 1 drop into both eyes 3 (three) times daily. 04/17/2019: No record of this in past 6 months per external pharmacy records  . levothyroxine (SYNTHROID) 50 MCG tablet TAKE 1 TABLET(50 MCG) BY MOUTH DAILY BEFORE BREAKFAST   . loperamide (IMODIUM) 2 MG capsule Take 1 capsule (2 mg total) by mouth as needed for diarrhea or loose stools.   . metFORMIN (GLUCOPHAGE) 500 MG tablet Take 1 tablet (500 mg total) by mouth 2 (two) times daily with a meal.   . Nutritional Supplements (GLUCERNA SNACK SHAKE) LIQD Take 1 Dose by mouth 3 (three) times daily as needed. 05/26/2019: She is drinking vanilla ensure instead of Glucerna  . Olopatadine HCl 0.2 % SOLN Place 1 drop into both eyes daily as needed (dry eyes).  04/17/2019: No record of this in past 6 months per external pharmacy records  . Polyethyl Glycol-Propyl Glycol (SYSTANE) 0.4-0.3 % GEL ophthalmic gel Place 1 application daily as needed into both eyes (dry eyes/irritation).    . potassium chloride SA (KLOR-CON) 20 MEQ tablet Take 1 tablet (20 mEq total) by mouth daily for 3 days.   . prednisoLONE acetate (PRED FORTE) 1 % ophthalmic suspension  04/17/2019: No record of this in past 6 months per external pharmacy records  . rosuvastatin (CRESTOR) 20 MG tablet Take 1 tablet (20 mg total) by mouth daily.   . Semaglutide,0.25 or 0.5MG/DOS, (OZEMPIC, 0.25 OR 0.5 MG/DOSE,) 2 MG/1.5ML SOPN Inject 0.25 mg into the skin once a week.   . venlafaxine XR (EFFEXOR-XR) 150 MG 24 hr capsule TAKE 1 CAPSULE BY MOUTH  DAILY WITH BREAKFAST    No facility-administered encounter medications on file as of 03/28/2020.    Patient Active Problem List   Diagnosis Date Noted  . Vitamin D deficiency 08/17/2019  . Weight loss 08/17/2019  . Carpal tunnel syndrome 06/01/2019  . Cognitive impairment 01/13/2019  . Insomnia 06/11/2018  . Hydronephrosis 04/01/2017  . Acquired  scoliosis 02/04/2017  . Degeneration of lumbar intervertebral disc 02/04/2017  . Osteoarthritis of facet joint of lumbar spine 02/04/2017  . Sciatica of left side without back pain 01/30/2017  . Neuropathy 12/09/2016  . Genetic testing 11/27/2015  . Primary osteoarthritis of first carpometacarpal joint of right hand 07/06/2015  . Preventative health care 08/26/2012  . HTN, goal below 140/90 08/13/2012  . CKD (chronic kidney disease) stage 3, GFR 30-59 ml/min (HCC) 08/13/2012  . Glaucoma associated with systemic syndromes(365.44) 08/07/2012  . Hypothyroidism 04/16/2006  . Anemia 04/16/2006  . Depression 04/16/2006  . LOW BACK PAIN 04/16/2006  . BREAST CANCER, HX OF 04/16/2006  . Hyperlipidemia 10/13/2003  . Type 2 diabetes mellitus with diabetic polyneuropathy, with long-term current use of insulin ([place]) 04/16/1991    Conditions to be addressed/monitored:NIDDM, HTN, HLD, CKD, hypothyroidism, depression, urinay incontinence  Care Plan : Diabetes Type 2 (Adult)  Updates made by Barrington Ellison,  RN since 03/28/2020 12:00 AM    Problem: Glycemic Management (Diabetes, Type 2)     Goal: Glycemic Management Optimized   Start Date: 05/24/2019  Expected End Date: 05/27/2020  Recent Progress: On track  Priority: High  Note:   Current Barriers:  . Chronic Disease Management support, education, and care coordination needs related to HTN, HLD, DMII, CKD Stage 3, and Depression- Clinical Goal(s) related to  HTN, HLD, DMII, CKD Stage 3, and Depression- spoke with patient to complete follow up assessment , she states her blood suagrs are doing "Ok" , says when [place] checks them they are all in low to mid 100's,  reports weight has stabilized and appetite "about the same"  Over the next 30 - 60 days, patient will:  . Work with the care management team to address educational, disease management, and care coordination needs  . Begin or continue self health monitoring activities as directed today  Measure and record CBG (blood glucose) 2 times daily and take medications as prescribed . Call provider office for new or worsened signs and symptoms HTN, HLD, DMII, CKD Stage 3, and Depression . Call care management team with questions or concerns . Verbalize basic understanding of patient centered plan of care established today  Interventions related to HTN, HLD, DMII, CKD Stage 3, and Depression . Prior to interviewing patient, reviewed patient status, including review of recent office visit notes, consultants reports, relevant laboratory and other test results, and medications, completed. . Assessed patient's DM and HTN self management strategies and reviewed blood glucose home readings, assessed for hypoglycemic episodes . Assessed appetite and weight stability . Reviewed medications with patient and discussed medication taking behavior . Discussed plans with patient for ongoing care management follow up and provided patient with direct contact information for care management team  Patient Self Care Activities related to HTN, HLD, DMII, CKD Stage 3, and Depression Patient is unable to independently self-manage chronic health conditions  Follow up: Care management will plan outreach to patient within 30-60 days   Care Plan : [facility] (Adult)  Updates made by Barrington Ellison, RN since 03/28/2020 12:00 AM    Problem: CCM RN- patient requesting assistance with getting inconitience supplies covered by Medicaid   Priority: High  Onset Date: 02/29/2020    Goal: Patient-Specific Goal- Patient will call urologist office and make urgent office visit to have dysuria and frequency assessed and treated. Pt will receive clarification of levothyroxine dose. Completed 03/28/2020  Start Date: 02/29/2020  Expected End Date: 03/06/2020  Recent Progress: On track  Priority: High  Note:   Current Barriers:  . Care Coordination needs related to securing acute urology appointment and  clarifying levothyroxine dose in a patient with NIDDM, HTN, HLD, CKD, hypothyroidism, depression- pt c/o urinary frequency and burning and darker urine, denies fever, chills, states son picked up refill on levothyroxine and the dose on the bottle is 88 mcg not 50 mcg which is what she was taking before . Unable to independently problem solve r/t urinary symptoms and med dosage  Nurse Case Manager Clinical Goal(s):  [name] Over the next 30 days, patient will verbalize understanding of plan to secure urology appointment and receive clarification of levothyroxine  Interventions:  . 1:1 collaboration with Aldine Contes, MD regarding development and update of comprehensive plan of care as evidenced by provider attestation and co-signature . Inter-disciplinary care team collaboration (see longitudinal plan of care) . Assessed symptoms and provided education to patient about  probable cause of urinary symptoms and importance of making acute appointment with urologist . Ensured patient has urology office number . Encouraged patient to drink plenty of water . Advised patient this CCM RN will contact provider regarding correct dose of levothyroxine . Messaged provider requesting clarification of levothyroxine dose- Dr Bridgett Larsson called pt and clarifed with her tha tdose should be 50 mcg and sent another Rx in to reflect the correct dose. . Per urology office (Dr. Jeffie Pollock) patient has appointment on 03/29/20   Patient Goals/Self-Care Activities Over the next **30* days, patient will:  - Patient will self administer medications as prescribed Patient will attend all scheduled provider appointments Patient will call pharmacy for medication refills Patient will call provider office for new concerns or questions Make and complete urgent urology appointment  Receive correct dose of levothyroxine  Follow Up Plan: The care management team will reach out to the patient again over the next 30-60 days.        Goal:  Patient will receive assistance in getting incontinence supplies covered by Medicaid   Start Date: 03/28/2020  Expected End Date: 05/23/2020  This Visit's Progress: On track  Priority: Low  Note:   Current Barriers:  . Care Coordination needs related to needed DME/Supplies in a patient with urinary incontinence . Care Coordination needs related to securing incontinence supplies in a patient with urinary incontinence  Nurse Case Manager Clinical Goal(s):  [name] Over the next 30-60 days, patient will verbalize understanding of plan to obtain needed DME.  . Over the next 30-60 days, patient will work with CCM RN and/or  [employer]  to address needs related to DME needs. . patient will work with CCM RN and [employer] to address needs related to urinary incontinence supplies  Interventions:   Inter-disciplinary care team collaboration (see longitudinal plan of care)  Collaboration with/confirmation of receipt of DME orders at  [employer]  Advised patient CCM RN will contact provider (both urologist and primary care provider) regarding completing paper work for [employer] incontinence supplies  Collaborated with [employer], urology office and clinic provider regarding urinary incontinence supplies paperwork completion and need for recent office visit notes addressing urinary incontinence   Collaboration with PCP regarding development and update of comprehensive plan of care as evidenced by provider attestation and co-signature  Reminded patient of her appointment with urologist on 03/29/20  Inter-disciplinary care team collaboration (see longitudinal plan of care)  Patient Self Care Activities:   Patient will attend all scheduled provider appointments  Patient will work with health  are team in securing urinary incontinence supplies and reimbursed via Medicaid  Unable to independently secure urinary incontinence supplies under Medicaid reimbursement benefit     Plan:The care management team will reach  out to the patient again over the next 30-60 days.   Kelli Churn RN, CCM, [facility] RN Care Manager [phone]

## 2020-03-29 DIAGNOSIS — N1330 Unspecified hydronephrosis: Secondary | ICD-10-CM | POA: Diagnosis not present

## 2020-03-29 DIAGNOSIS — R300 Dysuria: Secondary | ICD-10-CM | POA: Diagnosis not present

## 2020-03-29 NOTE — Progress Notes (Signed)
Internal Medicine Clinic Attending  CCM services provided by the care management provider and their documentation were discussed with Dr. Lee. We reviewed the pertinent findings, urgent action items addressed by the resident and non-urgent items to be addressed by the PCP.  I agree with the assessment, diagnosis, and plan of care documented in the CCM and resident's note.  Denette Hass, MD 03/29/2020  

## 2020-04-26 DIAGNOSIS — N1330 Unspecified hydronephrosis: Secondary | ICD-10-CM | POA: Diagnosis not present

## 2020-04-26 DIAGNOSIS — R8271 Bacteriuria: Secondary | ICD-10-CM | POA: Diagnosis not present

## 2020-05-02 ENCOUNTER — Other Ambulatory Visit: Payer: Self-pay | Admitting: Student

## 2020-05-02 DIAGNOSIS — E039 Hypothyroidism, unspecified: Secondary | ICD-10-CM

## 2020-05-22 ENCOUNTER — Other Ambulatory Visit: Payer: Self-pay

## 2020-05-22 DIAGNOSIS — F32A Depression, unspecified: Secondary | ICD-10-CM

## 2020-05-22 DIAGNOSIS — Z794 Long term (current) use of insulin: Secondary | ICD-10-CM

## 2020-05-22 DIAGNOSIS — E1142 Type 2 diabetes mellitus with diabetic polyneuropathy: Secondary | ICD-10-CM

## 2020-05-22 MED ORDER — ROSUVASTATIN CALCIUM 20 MG PO TABS: 20.0000 mg | ORAL_TABLET | Freq: Every day | ORAL | 3 refills | Status: DC

## 2020-05-22 MED ORDER — BUSPIRONE HCL 5 MG PO TABS: 5.0000 mg | ORAL_TABLET | Freq: Two times a day (BID) | ORAL | 3 refills | Status: DC

## 2020-05-22 MED ORDER — METFORMIN HCL 500 MG PO TABS: 500.0000 mg | ORAL_TABLET | Freq: Two times a day (BID) | ORAL | 11 refills | Status: DC

## 2020-05-22 NOTE — Telephone Encounter (Signed)
metFORMIN (GLUCOPHAGE) 500 MG tablet,  rosuvastatin (CRESTOR) 20 MG tablet   busPIRone (BUSPAR) 5 MG tablet, REFILL REQUEST @  [place], [place] [employer], [address] Phone:  [phone]  Fax:  [phone]

## 2020-05-23 ENCOUNTER — Ambulatory Visit: Payer: Medicare Other | Admitting: *Deleted

## 2020-05-23 ENCOUNTER — Ambulatory Visit (INDEPENDENT_AMBULATORY_CARE_PROVIDER_SITE_OTHER): Payer: Medicare Other | Admitting: Internal Medicine

## 2020-05-23 ENCOUNTER — Encounter: Payer: Self-pay | Admitting: Internal Medicine

## 2020-05-23 VITALS — BP 138/71 | HR 91 | Temp 99.3°F | Ht 62.0 in | Wt 108.3 lb

## 2020-05-23 DIAGNOSIS — R634 Abnormal weight loss: Secondary | ICD-10-CM | POA: Diagnosis not present

## 2020-05-23 DIAGNOSIS — E1142 Type 2 diabetes mellitus with diabetic polyneuropathy: Secondary | ICD-10-CM

## 2020-05-23 DIAGNOSIS — N1330 Unspecified hydronephrosis: Secondary | ICD-10-CM | POA: Diagnosis not present

## 2020-05-23 DIAGNOSIS — G629 Polyneuropathy, unspecified: Secondary | ICD-10-CM

## 2020-05-23 DIAGNOSIS — N183 Chronic kidney disease, stage 3 unspecified: Secondary | ICD-10-CM | POA: Diagnosis not present

## 2020-05-23 DIAGNOSIS — I10 Essential (primary) hypertension: Secondary | ICD-10-CM

## 2020-05-23 DIAGNOSIS — Z794 Long term (current) use of insulin: Secondary | ICD-10-CM

## 2020-05-23 DIAGNOSIS — E039 Hypothyroidism, unspecified: Secondary | ICD-10-CM

## 2020-05-23 DIAGNOSIS — F32A Depression, unspecified: Secondary | ICD-10-CM | POA: Diagnosis not present

## 2020-05-23 LAB — POCT GLYCOSYLATED HEMOGLOBIN (HGB A1C): Hemoglobin A1C: 8.5 % — AB (ref 4.0–5.6)

## 2020-05-23 LAB — GLUCOSE, CAPILLARY: Glucose-Capillary: 167 mg/dL — ABNORMAL HIGH (ref 70–99)

## 2020-05-23 NOTE — Chronic Care Management (AMB) (Signed)
Care Management    RN Visit Note  05/23/2020 Name: [name] MRN: [id number] DOB: 09-26-1950  Subjective: [name] is a 70 y.o. year old female who is a primary care patient of Aldine Contes, MD. The care management team was consulted for assistance with disease management and care coordination needs.    Engaged with patient by telephone for follow up visit in response to provider referral for case management and/or care coordination services.   Consent to Services:   [name] was given information about Care Management services today including:  1. Care Management services includes personalized support from designated clinical staff supervised by her physician, including individualized plan of care and coordination with other care providers 2. 24/7 contact phone numbers for assistance for urgent and routine care needs. 3. The patient may stop case management services at any time by phone call to the office staff.  Patient agreed to services and consent obtained.   Assessment: Review of patient past medical history, allergies, medications, health status, including review of consultants reports, laboratory and other test data, was performed as part of comprehensive evaluation and provision of chronic care management services.   SDOH (Social Determinants of Health) assessments and interventions performed:    Care Plan  Allergies  Allergen Reactions  . Gabapentin Other (See Comments)    Dizziness from 300 mg, tolerates 100 mg Dizziness from 300 mg, tolerates 100 mg   . Meclizine Hcl Rash  . Metformin Hcl Er Diarrhea    Patient did not tolerate Metformin ER but is able to take lower dose of Metformin  . Penicillins Rash    Has patient had a PCN reaction causing immediate rash, facial/tongue/throat swelling, SOB or lightheadedness with hypotension: Yes Has patient had a PCN reaction causing severe rash involving mucus membranes or skin necrosis: No Has patient had a PCN  reaction that required hospitalization: pt was in hospital at time of reaction Has patient had a PCN reaction occurring within the last 10 years: No If all of the above answers are "NO", then may proceed with Cephalosporin use.  . Sulfa Antibiotics Rash    Outpatient Encounter Medications as of 05/23/2020  Medication Sig Note  . Accu-Chek Softclix Lancets lancets Check blood sugar 5 times a day as instructed   . Acetaminophen 325 MG CAPS Take 325 mg by mouth every 6 (six) hours as needed (for pain).   . ALPHAGAN P 0.1 % SOLN Place 1 drop into both eyes 3 (three) times daily. 04/17/2019: No record of this in past 6 months per external pharmacy records  . Besifloxacin HCl 0.6 % SUSP Besivance 0.6 % eye drops,suspension  INSTILL 1 DROP INTO THE LEFT EYE QID X 2 DAYS AFTER EACH MONTHLY EYE INJECTION 04/17/2019: No record of this in past 6 months per external pharmacy records  . Blood Glucose Monitoring Suppl (ACCU-CHEK GUIDE ME) w/Device KIT Check blood sugar 5 times a day   . busPIRone (BUSPAR) 5 MG tablet Take 1 tablet (5 mg total) by mouth 2 (two) times daily.   . calcium citrate-vitamin D (CITRACAL+D) 315-200 MG-UNIT tablet Take 1 tablet by mouth 2 (two) times daily. (Patient not taking: Reported on 11/20/2017)   . Cholecalciferol (VITAMIN D3) 2000 units TABS Take 2,000 Units at bedtime by mouth.    . ciprofloxacin (CILOXAN) 0.3 % ophthalmic ointment  04/17/2019: No record of this in past 6 months per external pharmacy records  . cycloSPORINE (RESTASIS) 0.05 % ophthalmic emulsion Restasis MultiDose 0.05 % eye  drops  INT 1 GTT IN [facility] EYE BID 04/17/2019: No record of this in past 6 months per external pharmacy records  . erythromycin ophthalmic ointment APPLY A SMALL AMOUNT INTO LEFT EYE TID 04/17/2019: No record of this in past 6 months per external pharmacy records  . glucose 4 GM chewable tablet Chew 4 tablets (16 g total) by mouth as needed for low blood sugar. 04/17/2019: No record of this in past  6 months per external pharmacy records  . glucose blood (ACCU-CHEK GUIDE) test strip Check blood sugar 5 times per day   . glucose blood test strip OneTouch Verio test strips  USE TO CHECK BS BID   . Insulin Pen Needle (BD PEN NEEDLE NANO U/F) 32G X 4 MM MISC USE AS DIRECTED   . ketorolac (ACULAR) 0.5 % ophthalmic solution Place 1 drop into both eyes 3 (three) times daily. 04/17/2019: No record of this in past 6 months per external pharmacy records  . levothyroxine (SYNTHROID) 50 MCG tablet TAKE 1 TABLET BY MOUTH  DAILY BEFORE BREAKFAST   . loperamide (IMODIUM) 2 MG capsule Take 1 capsule (2 mg total) by mouth as needed for diarrhea or loose stools.   . metFORMIN (GLUCOPHAGE) 500 MG tablet Take 1 tablet (500 mg total) by mouth 2 (two) times daily with a meal.   . Nutritional Supplements (GLUCERNA SNACK SHAKE) LIQD Take 1 Dose by mouth 3 (three) times daily as needed. 05/26/2019: She is drinking vanilla ensure instead of Glucerna  . Olopatadine HCl 0.2 % SOLN Place 1 drop into both eyes daily as needed (dry eyes).  04/17/2019: No record of this in past 6 months per external pharmacy records  . Polyethyl Glycol-Propyl Glycol (SYSTANE) 0.4-0.3 % GEL ophthalmic gel Place 1 application daily as needed into both eyes (dry eyes/irritation).    . potassium chloride SA (KLOR-CON) 20 MEQ tablet Take 1 tablet (20 mEq total) by mouth daily for 3 days.   . prednisoLONE acetate (PRED FORTE) 1 % ophthalmic suspension  04/17/2019: No record of this in past 6 months per external pharmacy records  . rosuvastatin (CRESTOR) 20 MG tablet Take 1 tablet (20 mg total) by mouth daily.   . Semaglutide,0.25 or 0.5MG/DOS, (OZEMPIC, 0.25 OR 0.5 MG/DOSE,) 2 MG/1.5ML SOPN Inject 0.25 mg into the skin once a week.   . venlafaxine XR (EFFEXOR-XR) 150 MG 24 hr capsule TAKE 1 CAPSULE BY MOUTH  DAILY WITH BREAKFAST    No facility-administered encounter medications on file as of 05/23/2020.    Patient Active Problem List   Diagnosis  Date Noted  . Vitamin D deficiency 08/17/2019  . Weight loss 08/17/2019  . Carpal tunnel syndrome 06/01/2019  . Cognitive impairment 01/13/2019  . Insomnia 06/11/2018  . Hydronephrosis 04/01/2017  . Acquired scoliosis 02/04/2017  . Degeneration of lumbar intervertebral disc 02/04/2017  . Osteoarthritis of facet joint of lumbar spine 02/04/2017  . Sciatica of left side without back pain 01/30/2017  . Neuropathy 12/09/2016  . Genetic testing 11/27/2015  . Primary osteoarthritis of first carpometacarpal joint of right hand 07/06/2015  . Preventative health care 08/26/2012  . HTN, goal below 140/90 08/13/2012  . CKD (chronic kidney disease) stage 3, GFR 30-59 ml/min (HCC) 08/13/2012  . Glaucoma associated with systemic syndromes(365.44) 08/07/2012  . Hypothyroidism 04/16/2006  . Anemia 04/16/2006  . Depression 04/16/2006  . LOW BACK PAIN 04/16/2006  . BREAST CANCER, HX OF 04/16/2006  . Hyperlipidemia 10/13/2003  . Type 2 diabetes mellitus with diabetic polyneuropathy, with  long-term current use of insulin ([place]) 04/16/1991    Conditions to be addressed/monitored: NIDDM, HTN, HLD, CKD, hypothyroidism, depression, urinary incontinence  Care Plan : Diabetes Type 2 (Adult)  Updates made by Barrington Ellison, RN since 05/23/2020 12:00 AM    Problem: Glycemic Management (Diabetes, Type 2)     Goal: Glycemic Management Optimized   Start Date: 05/24/2019  Expected End Date: 05/27/2020  This Visit's Progress: Not on track  Recent Progress: On track  Priority: High  Note:   Current Barriers:  . Chronic Disease Management support, education, and care coordination needs related to HTN, HLD, DMII, CKD Stage 3, and Depression- Clinical Goal(s) related to  HTN, HLD, DMII, CKD Stage 3, and Depression- spoke with patient to complete follow up assessment , she states her clinic appointment with Dr Dareen Piano went "ok". She says her A1C had increased so she was instructed to take 2 Metformin at dinner and  one Metformin in the morning, she states her son [name] continues to check her blood sugar and she follows a CHO controlled meal plan because [name] also has diabetes, reports her weight has stabilized and appetite "about the same",says she followed up with Dr. Jeffie Pollock, her urologist, for hydronephrosis and was prescribed medicine for "burning when I pee"   Over the next 30 - 60 days, patient will:  . Work with the care management team to address educational, disease management, and care coordination needs  . Begin or continue self health monitoring activities as directed today Measure and record CBG (blood glucose) 2 times daily and take medications as prescribed . Call provider office for new or worsened signs and symptoms HTN, HLD, DMII, CKD Stage 3, and Depression . Call care management team with questions or concerns . Verbalize basic understanding of patient centered plan of care established today  Interventions related to HTN, HLD, DMII, CKD Stage 3, and Depression . Prior to interviewing patient, reviewed patient status, including review of recent office visit notes, consultants reports, relevant laboratory and other test results, and medications, completed. . Assessed patient's DM and HTN self management strategies and reviewed blood glucose home readings, assessed for hypoglycemic episodes . Discussed increase in Hgb A1C and possible causes, discussed correlation between Hgb A1C of 8.5% and eAG (=197)  . Reviewed strategies to reduce Hgb A1C, drink water, eat  and drink low glycemic index foods . Reviewed medications with patient and discussed medication taking behavior . Reviewed Dr. Wilber Bihari note of today and ensured patient can state change in Metformin dose correctly . Assessed appetite and weight stability, she has lost weight ( 4 lbs) since her clinic visit in September- encouraged her to continue to drink low glycemic index nutritional supplement  . Discussed plans with patient for  ongoing care management follow up and provided patient with direct contact information for care management team  Patient Self Care Activities related to HTN, HLD, DMII, CKD Stage 3, and Depression Patient is unable to independently self-manage chronic health conditions  Follow up: Care management will plan outreach to patient within 30-60 days   Care Plan : [place] (Adult)  Updates made by Barrington Ellison, RN since 05/23/2020 12:00 AM    Problem: CCM RN- patient requesting assistance with getting incontinence supplies covered by Medicaid Resolved 05/23/2020  Priority: High  Onset Date: 02/29/2020    Goal: Patient will receive assistance in getting incontinence supplies covered by Medicaid Completed 05/23/2020  Start Date: 03/28/2020  Expected End Date: 05/23/2020  Recent Progress: On track  Priority: Low  Note:   Current Barriers:  . Care Coordination needs related to needed DME/Supplies in a patient with urinary incontinence . Care Coordination needs related to securing incontinence supplies in a patient with urinary incontinence- patient says she saw urologist recently and occasionally has urinary urgency at night, denies need for incontinence supplies a this time  Nurse Case Manager Clinical Goal(s):  [name] Over the next 30-60 days, patient will verbalize understanding of plan to obtain needed DME.  . Over the next 30-60 days, patient will work with CCM RN and/or  [employer]  to address needs related to DME needs. . patient will work with CCM RN and [employer] to address needs related to urinary incontinence supplies  Interventions:  . Inter-disciplinary care team collaboration (see longitudinal plan of care) . Collaboration with/confirmation of receipt of DME orders at  [employer] . Advised patient CCM RN will contact provider regarding securing order  . Collaborated with [employer], urology office and clinic provider regarding urinary incontinence supplies paperwork completion  and need for recent office visit notes addressing urinary incontinence  . Collaboration with PCP regarding development and update of comprehensive plan of care as evidenced by provider attestation and co-signature . Reminded patient of her appointment with urologist on 03/29/20 . Inter-disciplinary care team collaboration (see longitudinal plan of care) Patient Self Care Activities:  . Patient will attend all scheduled provider appointments . Patient will work with health  are team in securing urinary incontinence supplies and reimbursed via Medicaid . Unable to independently secure urinary incontinence supplies under Medicaid reimbursement benefit     Plan: The care management team will reach out to the patient again over the next 30-60 days.  Kelli Churn RN, CCM, [facility] RN Care Manager [phone]

## 2020-05-23 NOTE — Assessment & Plan Note (Signed)
-   This problem is chronic and stable -Patient states that her weight has been stable and that she is tolerating oral intake well.  She denies any heat or cold intolerance -We will check TSH and free T4 today -Continue levothyroxine 50 mcg daily -No further work-up at this time

## 2020-05-23 NOTE — Assessment & Plan Note (Signed)
Lab Results  Component Value Date   HGBA1C 8.5 (A) 05/23/2020   HGBA1C 7.9 (A) 10/19/2019   HGBA1C 7.7 (A) 07/21/2019     Assessment: Diabetes control:  Fair Progress toward A1C goal:   Deteriorated Comments: Patient is compliant with Ozempic 0.25 mg once weekly, metformin 500 mg twice daily  Plan: Medications:  Will increase metformin to 500 mg in a.m. and 1000 mg in p.m. Home glucose monitoring: Frequency:   Timing:   Instruction/counseling given: reminded to bring blood glucose meter & log to each visit Educational resources provided:   Self management tools provided:   Other plans: We will check BMP today.  Patient also instructed to go back to Tylenol comes twice daily if she is not tolerating the increased dose of metformin

## 2020-05-23 NOTE — Assessment & Plan Note (Signed)
-   Patient's weight appears to be stable at around 108 pounds (down from 112 in September but stable from her prior weights of 108-109 pounds) -I suspect that her weight loss was likely secondary to Ozempic and she appears stable currently -We will continue to monitor closely

## 2020-05-23 NOTE — Patient Instructions (Addendum)
-   It was a pleasure seeing you today -Your A1c has worsened slightly to 8.5 from 7.9.  I would increase the metformin to 1000 mg at night and 500 mg in the morning to see if he can tolerate this.  If you get recurrent GI side effects I would go back to the 500 mg twice a day -We will check some blood work on you today and a urine test -Please get your COVID booster at [facility] -Please call me with any questions or concerns or if you need any refills

## 2020-05-23 NOTE — Assessment & Plan Note (Signed)
-   This problem is chronic and stable -Patient denies any worsening of mood and overall feels well -We will continue with Effexor 150 mg daily as well as buspirone 5 mg twice daily. -Patient's PHQ-9 score is stable at 6 -No further work-up at this time

## 2020-05-23 NOTE — Patient Instructions (Signed)
Visit Information It was nice speaking with you today. Goals Addressed              This Visit's Progress     Patient Stated   .  Set My Target A1C-Diabetes Type 2- " My son [name] checks my blood sugar and makes sure I eat the right kind of foods." (pt-stated)        Timeframe:  Long-Range Goal Priority:  High Start Date:       05/24/19                      Expected End Date:   06/28/20                   Follow Up Date 06/20/20   - set target A1C    Why is this important?    Your target A1C is decided together by you and your doctor.   It is based on several things like your age and other health issues.    Notes: 05/23/20- A1C checked in clinic today increased from 7.9% to 8.5%, per patient blood sugars are checked by son [name] , who also has diabetes       The patient verbalized understanding of instructions, educational materials, and care plan provided today and declined offer to receive copy of patient instructions, educational materials, and care plan.   The care management team will reach out to the patient again over the next 30-60 days.   Kelli Churn RN, CCM, [facility] RN Care Manager [phone]

## 2020-05-23 NOTE — Assessment & Plan Note (Signed)
-   This problem is chronic and uncontrolled -Patient states that she has intermittent tingling numbness and her right arm as well as her fingers in her right hand -Patient states that she has decreased grip that her right hand -Etiology behind her tingling numbness remains uncertain.  It seems to be only in her right hand and arm which would be consistent with a diabetic neuropathy.  Patient did have a history of carpal tunnel syndrome for which she had surgery in her right hand. -If this persists I would consider having her follow-up with neurology for nerve conduction study for further evaluation

## 2020-05-23 NOTE — Assessment & Plan Note (Signed)
-   This problem is chronic and stable -We will recheck a BMP today -No further work-up at this time as

## 2020-05-23 NOTE — Assessment & Plan Note (Addendum)
BP Readings from Last 3 Encounters:  05/23/20 138/71  10/19/19 128/77  08/17/19 (!) 89/55    Lab Results  Component Value Date   NA 141 06/30/2019   K 4.3 06/30/2019   CREATININE 0.89 06/30/2019    Assessment: Blood pressure control:  Well-controlled Progress toward BP goal:   At goal Comments: Patient has been controlled off all medications  Plan: Medications:  Currently not on any antihypertensives Educational resources provided:   Self management tools provided:   Other plans: We will check BMP today

## 2020-05-23 NOTE — Assessment & Plan Note (Signed)
-   This problem is chronic and stable -Patient follows up with [facility] for this and saw them earlier this month -Patient's right-sided hydronephrosis appears to be stable on repeat ultrasound done at their office -No further work-up at this time

## 2020-05-23 NOTE — Progress Notes (Signed)
   Subjective:    Patient ID: [name], female    DOB: 30-Dec-1950, 70 y.o.   MRN: [id number]  HPI  I have seen and examined this patient.  Patient is here for routine follow-up of her hypertension and diabetes.  Patient states that she has intermittent tingling and numbness over her right arm which has been chronic.  She denies any other complaints at this time and states that she is compliant with all her medications.   Review of Systems  Constitutional: Negative.   HENT: Negative.   Respiratory: Negative.   Cardiovascular: Negative.   Gastrointestinal: Negative.   Musculoskeletal: Negative.   Neurological: Positive for numbness.  Psychiatric/Behavioral: Negative.        Objective:   Physical Exam Constitutional:      Appearance: Normal appearance.  HENT:     Head: Normocephalic and atraumatic.  Cardiovascular:     Rate and Rhythm: Normal rate and regular rhythm.     Heart sounds: Normal heart sounds.  Pulmonary:     Effort: Pulmonary effort is normal. No respiratory distress.     Breath sounds: Normal breath sounds. No wheezing.  Abdominal:     General: Bowel sounds are normal. There is no distension.     Palpations: Abdomen is soft.     Tenderness: There is no abdominal tenderness.  Musculoskeletal:        General: No swelling or tenderness.     Cervical back: Neck supple.  Lymphadenopathy:     Cervical: No cervical adenopathy.  Neurological:     Mental Status: She is alert and oriented to person, place, and time.  Psychiatric:        Mood and Affect: Mood normal.        Behavior: Behavior normal.           Assessment & Plan:  Please see problem based charting for assessment and plan:

## 2020-05-24 LAB — BMP8+ANION GAP
Anion Gap: 17 mmol/L (ref 10.0–18.0)
BUN/Creatinine Ratio: 12 (ref 12–28)
BUN: 12 mg/dL (ref 8–27)
CO2: 25 mmol/L (ref 20–29)
Calcium: 10.3 mg/dL (ref 8.7–10.3)
Chloride: 98 mmol/L (ref 96–106)
Creatinine, Ser: 0.99 mg/dL (ref 0.57–1.00)
Glucose: 180 mg/dL — ABNORMAL HIGH (ref 65–99)
Potassium: 3.4 mmol/L — ABNORMAL LOW (ref 3.5–5.2)
Sodium: 140 mmol/L (ref 134–144)
eGFR: 62 mL/min/{1.73_m2} (ref >59)

## 2020-05-24 LAB — MICROALBUMIN / CREATININE URINE RATIO
Creatinine, Urine: 93 mg/dL
Microalb/Creat Ratio: 326 mg/g creat — ABNORMAL HIGH (ref 0–29)
Microalbumin, Urine: 303.2 ug/mL

## 2020-05-24 LAB — TSH: TSH: 7.93 u[IU]/mL — ABNORMAL HIGH (ref 0.450–4.500)

## 2020-05-24 LAB — LIPID PANEL
Chol/HDL Ratio: 2.2 ratio (ref 0.0–4.4)
Cholesterol, Total: 105 mg/dL (ref 100–199)
HDL: 47 mg/dL (ref >39)
LDL Chol Calc (NIH): 43 mg/dL (ref 0–99)
Triglycerides: 69 mg/dL (ref 0–149)
VLDL Cholesterol Cal: 15 mg/dL (ref 5–40)

## 2020-05-24 LAB — T4, FREE: Free T4: 1.14 ng/dL (ref 0.82–1.77)

## 2020-05-29 ENCOUNTER — Telehealth: Payer: Self-pay | Admitting: Internal Medicine

## 2020-05-29 NOTE — Telephone Encounter (Signed)
I called the patient to discuss the results of her lab work with her.  Patient noted to have a normal BMP except for mildly low potassium level of 3.4 and a mildly elevated blood glucose of 180.  Her lipid panel is much improved with an LDL of 43 currently and a total cholesterol of 105 with HDL of 47.  Urine microalbumin/creatinine ratio is mildly elevated at 326.  She is currently off all antihypertensives but would consider starting the patient on a low-dose of losartan at her next visit.  Patient also had her thyroid function test done with a normal free T4 but a mildly elevated TSH in the sevens.  We will continue her on her current dose of 50 mcg of Synthroid since she became hyperthyroid at a dose of 75 mcg.  We will continue to monitor her closely.  Patient expressed understanding and is in agreement with plan.

## 2020-06-12 ENCOUNTER — Ambulatory Visit: Payer: Medicare Other | Admitting: *Deleted

## 2020-06-12 DIAGNOSIS — I10 Essential (primary) hypertension: Secondary | ICD-10-CM

## 2020-06-12 DIAGNOSIS — F32A Depression, unspecified: Secondary | ICD-10-CM

## 2020-06-12 DIAGNOSIS — N183 Chronic kidney disease, stage 3 unspecified: Secondary | ICD-10-CM

## 2020-06-12 DIAGNOSIS — E1142 Type 2 diabetes mellitus with diabetic polyneuropathy: Secondary | ICD-10-CM

## 2020-06-12 DIAGNOSIS — E039 Hypothyroidism, unspecified: Secondary | ICD-10-CM

## 2020-06-12 NOTE — Chronic Care Management (AMB) (Signed)
  Care Management   Note  06/12/2020 Name: Taiyana Kissler MRN: [id number] DOB: 1950/07/18  Henley Blyth is enrolled in a Managed Medicaid plan: No.  Orders for urinary incontinence supplies placed in Dr Wilber Bihari mailbox for completion.   The care management team will reach out to the patient again over the next 30 days.   Kelli Churn RN, CCM, [facility] RN Care Manager [phone]

## 2020-06-15 ENCOUNTER — Telehealth: Payer: Self-pay

## 2020-06-15 NOTE — Telephone Encounter (Signed)
Thank you :)

## 2020-06-15 NOTE — Telephone Encounter (Signed)
Dr. Dareen Piano asked triage nurse if I could make a telephone appt for patient to discuss incontinent supplies. Telephone call placed to patient and telehealth appt made for tomorrow at 1045 with Dr. Sherry Ruffing. SChaplin, RN,BSN

## 2020-06-16 ENCOUNTER — Other Ambulatory Visit: Payer: Self-pay

## 2020-06-16 ENCOUNTER — Ambulatory Visit (INDEPENDENT_AMBULATORY_CARE_PROVIDER_SITE_OTHER): Payer: Medicare Other | Admitting: Internal Medicine

## 2020-06-16 DIAGNOSIS — N3946 Mixed incontinence: Secondary | ICD-10-CM | POA: Diagnosis not present

## 2020-06-16 NOTE — Progress Notes (Signed)
  [facility] Internal Medicine Residency Telephone Encounter Continuity Care Appointment  HPI:   This telephone encounter was created for [name] on 06/16/2020 for the following purpose/cc urinary incontinence.  Patient reports that for the past few months she has been having increased urination, urinating about 3-4 times per day, and nocturia, urinates about 2-3 times per night, and dysuria.  She also has been having issues with urinary incontinence, will often occur after she hears running water or she has the sudden urge to go, she also reports some difficulty making it to the bathroom on time and feels that it will leak out.  She has been using some depends for few months.  She denies any fevers, chills, nausea, vomiting, abdominal pain, chest pain, or shortness of breath.  She does have a history of hydronephrosis and had followed with urology in the past.   Past Medical History:  Past Medical History:  Diagnosis Date  . Arthritis   . Bilateral hip pain 02/24/2015  . Borderline glaucoma of both eyes   . Depression   . Diabetic polyneuropathy ([place])   . Diverticulosis of colon   . Dizziness 12/05/2017  . Dry eyes, bilateral   . Feeling of incomplete bladder emptying   . Frequent falls 01/08/2018  . History of breast cancer oncologist-  dr Waymon Budge-- per lov note no recurrence   dx 04/ 1999 --- Stage 3B-- s/p  chemotherapy then right mastectomy then concurrent chemoradiation therapy  . History of urinary retention    01/ 2018  . Hydronephrosis of right kidney   . Hyperlipidemia   . Hypertension   . Hypokalemia 12/09/2016  . Hypomagnesemia 12/09/2016  . Hypothyroidism   . Insulin dependent type 2 diabetes mellitus [facility])    endocrinologist-  dr Cruzita Lederer---  last A1c 8.7 on 02-20-2016  . Left leg pain 03/15/2019  . Lymphedema of upper extremity    right  . Macular degeneration, left eye    followed by dr Zigmund Daniel  . Renal insufficiency   . Seizure-like activity ([place])  04/17/2019  . Urgency of urination   . Visual hallucinations 01/12/2019      ROS:  Constitutional: Negative for chills and fever.  Respiratory: Negative for shortness of breath.   Cardiovascular: Negative for chest pain and leg swelling.  Gastrointestinal: Negative for abdominal pain, nausea and vomiting.  Genitourinary: Positive for dysuria, increased urinary frequency, nocturia.  Neurological: Negative for dizziness and headaches.    Assessment / Plan / Recommendations:   Please see A&P under problem oriented charting for assessment of the patient's acute and chronic medical conditions.   As always, pt is advised that if symptoms worsen or new symptoms arise, they should go to an urgent care facility or to to ER for further evaluation.   Consent and Medical Decision Making:   Patient discussed with Dr. Dareen Piano  This is a telephone encounter between [name] and Asencion Noble on 06/16/2020 for urinary incontinence. The visit was conducted with the patient located at home and Asencion Noble at [facility]. The patient's identity was confirmed using their DOB and current address. The patient has consented to being evaluated through a telephone encounter and understands the associated risks (an examination cannot be done and the patient may need to come in for an appointment) / benefits (allows the patient to remain at home, decreasing exposure to coronavirus). I personally spent 10 minutes on medical discussion.

## 2020-06-16 NOTE — Assessment & Plan Note (Addendum)
Patient reports that for the past few months she has been having increased urination, urinating about 3-4 times per day, and nocturia, urinates about 2-3 times per night, and dysuria.  She also has been having issues with urinary incontinence, will often occur after she hears running water or she has the sudden urge to go, she also reports some difficulty making it to the bathroom on time and feels that it will leak out.  She has been using some depends for few months.  She denies any fevers, chills, nausea, vomiting, abdominal pain, chest pain, or shortness of breath.  She does have a history of hydronephrosis and had followed with urology in the past. Overall appears to be a mix of urge and functional incontinence, however given her dysuria, increased urinary frequency, nocturia there could also be underlying urinary tract infection.  She also has a history of diabetes that has been relatively well controlled however this could also be contributing.  She has no systemic symptoms at this time.  Patient will benefit from urinary incontinence supplies.  We will have her come in early next week for urinalysis and vaginal exam.  -Fill out urinary incontinence supplies DME order -Clinic appointment early next week for urinalysis and vaginal exam -If no evidence of infection or vaginal atrophy, consider starting Myrbetriq -Can also consider her following back up with urology

## 2020-06-19 ENCOUNTER — Encounter: Payer: Medicare Other | Admitting: Student

## 2020-06-19 NOTE — Progress Notes (Signed)
Internal Medicine Clinic Attending ° °Case discussed with Dr. Krienke  At the time of the visit.  We reviewed the resident’s history and exam and pertinent patient test results.  I agree with the assessment, diagnosis, and plan of care documented in the resident’s note.  °

## 2020-06-20 ENCOUNTER — Ambulatory Visit: Payer: Medicare Other | Admitting: *Deleted

## 2020-06-20 ENCOUNTER — Telehealth: Payer: Self-pay

## 2020-06-20 DIAGNOSIS — I10 Essential (primary) hypertension: Secondary | ICD-10-CM

## 2020-06-20 DIAGNOSIS — N3946 Mixed incontinence: Secondary | ICD-10-CM

## 2020-06-20 DIAGNOSIS — Z794 Long term (current) use of insulin: Secondary | ICD-10-CM

## 2020-06-20 DIAGNOSIS — E1142 Type 2 diabetes mellitus with diabetic polyneuropathy: Secondary | ICD-10-CM

## 2020-06-20 DIAGNOSIS — E039 Hypothyroidism, unspecified: Secondary | ICD-10-CM

## 2020-06-20 DIAGNOSIS — F32A Depression, unspecified: Secondary | ICD-10-CM

## 2020-06-20 DIAGNOSIS — N1330 Unspecified hydronephrosis: Secondary | ICD-10-CM

## 2020-06-20 DIAGNOSIS — N183 Chronic kidney disease, stage 3 unspecified: Secondary | ICD-10-CM

## 2020-06-20 MED ORDER — OZEMPIC (0.25 OR 0.5 MG/DOSE) 2 MG/1.5ML ~~LOC~~ SOPN: 0.2500 mg | PEN_INJECTOR | SUBCUTANEOUS | 1 refills | Status: DC

## 2020-06-20 NOTE — Telephone Encounter (Signed)
Effexor #90 with 3 refills sent 02/25/20 to [facility] Levothyroxine #90 with 3 refills sent 05/03/20 to [facility]  Patient notified to call [facility] for the above refills. Will send request for Ozempic as it was last sent to [facility].

## 2020-06-20 NOTE — Chronic Care Management (AMB) (Signed)
  Care Management   Note  06/20/2020 Name: [name] MRN: [id number] DOB: 1950-06-06  [name] is enrolled in a Managed Medicaid plan: No.   Successfully faxed signed orders and completed certificate of medical necessity to support need for urinary incontinence supplies to  [facility] at fax# [phone].   The care management team will reach out to the patient again over the next 30 days.   Kelli Churn RN, CCM, [facility] RN Care Manager [phone]

## 2020-06-20 NOTE — Telephone Encounter (Signed)
  venlafaxine XR (EFFEXOR-XR) 150 MG 24 hr capsule,    levothyroxine (SYNTHROID) 50 MCG tablet,   Semaglutide,0.25 or 0.5MG /DOS, (OZEMPIC, 0.25 OR 0.5 MG/DOSE,) 2 MG/1.5ML SOPNrefill request @  [place], [address], [address] Phone:  [phone]  Fax:  [phone]

## 2020-06-21 ENCOUNTER — Ambulatory Visit: Payer: Medicare Other | Admitting: Podiatry

## 2020-06-21 NOTE — Telephone Encounter (Signed)
Requesting to speak with a nurse about getting refill on effexor. Please call back.

## 2020-06-22 ENCOUNTER — Telehealth: Payer: Self-pay | Admitting: *Deleted

## 2020-06-22 ENCOUNTER — Encounter: Payer: Medicare Other | Admitting: Student

## 2020-06-22 NOTE — Telephone Encounter (Signed)
Spoke with patient regarding this missed appointment. Patient is to call the clinic at [phone] to reschedule this appointment.

## 2020-06-23 ENCOUNTER — Telehealth: Payer: Self-pay | Admitting: Internal Medicine

## 2020-06-23 NOTE — Telephone Encounter (Signed)
Pt calling to report burning during urination x 1 week. Pt requesting a call back. Pt states she was unable to come on 06/20/2024 due to family issues.

## 2020-06-23 NOTE — Telephone Encounter (Signed)
RTC, patient states she missed her appt yesterday and would like to reschedule.  Per patient's LOV-telehealth w/ Dr. Sherry Ruffing, pt is to have an in person visit for urinalysis and vaginal exam, possible referral to urology. Appt rescheduled to 06/29/20 w/ Dr. Laural Golden @ [phone]. SChaplin, RN,BSN

## 2020-06-29 ENCOUNTER — Encounter: Payer: Medicare Other | Admitting: Internal Medicine

## 2020-06-29 ENCOUNTER — Telehealth: Payer: Self-pay | Admitting: *Deleted

## 2020-06-29 NOTE — Telephone Encounter (Signed)
Patient contacted regarding her missed appointment.  She has been rescheduled to June 2022.

## 2020-07-03 ENCOUNTER — Telehealth: Payer: Self-pay | Admitting: *Deleted

## 2020-07-03 NOTE — Telephone Encounter (Signed)
[facility] pharmacy informed of Dr Wilber Bihari response.

## 2020-07-03 NOTE — Telephone Encounter (Signed)
Yes. It is correct. Her BMI was low and we decided to keep her on the same dose rather than increase.

## 2020-07-03 NOTE — Telephone Encounter (Signed)
Received fax from [facility] for clarification. States Ozempic pen is typically dosed 0.25 mg once weekly then increased. Received rx for ozempic 0.25 once week only. Wants to know is this correct, if not send rx with increase dose. Thanks

## 2020-07-04 ENCOUNTER — Ambulatory Visit: Payer: Medicare Other

## 2020-07-04 ENCOUNTER — Telehealth: Payer: Medicare Other

## 2020-07-04 NOTE — Chronic Care Management (AMB) (Signed)
Care Management    RN Visit Note  07/04/2020 Name: [name] MRN: [id number] DOB: [birth year]  Subjective: [name] is a 70 y.o. year old female who is a primary care patient of Aldine Contes, MD. The care management team was consulted for assistance with disease management and care coordination needs.    Engaged with patient by telephone for follow up visit in response to provider referral for case management and/or care coordination services.   Consent to Services:   Ms. Norenberg was given information about Care Management services today including:  1. Care Management services includes personalized support from designated clinical staff supervised by her physician, including individualized plan of care and coordination with other care providers 2. 24/7 contact phone numbers for assistance for urgent and routine care needs. 3. The patient may stop case management services at any time by phone call to the office staff.  Patient agreed to services and consent obtained.    Assessment: Patient continues to work on lowering her A1C.. See Care Plan below for interventions and patient self-care actives. Follow up Plan: Patient would like continued follow-up.  CCM RNCM will outreach the patient within the next 5 weeks  Patient will call office if needed prior to next encounter: Review of patient past medical history, allergies, medications, health status, including review of consultants reports, laboratory and other test data, was performed as part of comprehensive evaluation and provision of chronic care management services.   SDOH (Social Determinants of Health) assessments and interventions performed:    Care Plan  Allergies  Allergen Reactions  . Gabapentin Other (See Comments)    Dizziness from 300 mg, tolerates 100 mg Dizziness from 300 mg, tolerates 100 mg   . Meclizine Hcl Rash  . Metformin Hcl Er Diarrhea    Patient did not tolerate Metformin ER but is able to take lower  dose of Metformin  . Penicillins Rash    Has patient had a PCN reaction causing immediate rash, facial/tongue/throat swelling, SOB or lightheadedness with hypotension: Yes Has patient had a PCN reaction causing severe rash involving mucus membranes or skin necrosis: No Has patient had a PCN reaction that required hospitalization: pt was in hospital at time of reaction Has patient had a PCN reaction occurring within the last 10 years: No If all of the above answers are "NO", then may proceed with Cephalosporin use.  . Sulfa Antibiotics Rash    Outpatient Encounter Medications as of 07/04/2020  Medication Sig Note  . Accu-Chek Softclix Lancets lancets Check blood sugar 5 times a day as instructed   . ALPHAGAN P 0.1 % SOLN Place 1 drop into both eyes 3 (three) times daily. 04/17/2019: No record of this in past 6 months per external pharmacy records  . Besifloxacin HCl 0.6 % SUSP Besivance 0.6 % eye drops,suspension  INSTILL 1 DROP INTO THE LEFT EYE QID X 2 DAYS AFTER EACH MONTHLY EYE INJECTION 04/17/2019: No record of this in past 6 months per external pharmacy records  . Blood Glucose Monitoring Suppl (ACCU-CHEK GUIDE ME) w/Device KIT Check blood sugar 5 times a day   . busPIRone (BUSPAR) 5 MG tablet Take 1 tablet (5 mg total) by mouth 2 (two) times daily.   . calcium citrate-vitamin D (CITRACAL+D) 315-200 MG-UNIT tablet Take 1 tablet by mouth 2 (two) times daily. (Patient not taking: Reported on 11/20/2017)   . Cholecalciferol (VITAMIN D3) 2000 units TABS Take 2,000 Units at bedtime by mouth.    . cycloSPORINE (RESTASIS) 0.05 %  ophthalmic emulsion Restasis MultiDose 0.05 % eye drops  INT 1 GTT IN [facility] EYE BID 04/17/2019: No record of this in past 6 months per external pharmacy records  . glucose 4 GM chewable tablet Chew 4 tablets (16 g total) by mouth as needed for low blood sugar. 04/17/2019: No record of this in past 6 months per external pharmacy records  . glucose blood (ACCU-CHEK GUIDE) test  strip Check blood sugar 5 times per day   . glucose blood test strip OneTouch Verio test strips  USE TO CHECK BS BID   . Insulin Pen Needle (BD PEN NEEDLE NANO U/F) 32G X 4 MM MISC USE AS DIRECTED   . ketorolac (ACULAR) 0.5 % ophthalmic solution Place 1 drop into both eyes 3 (three) times daily. 04/17/2019: No record of this in past 6 months per external pharmacy records  . levothyroxine (SYNTHROID) 50 MCG tablet TAKE 1 TABLET BY MOUTH  DAILY BEFORE BREAKFAST   . metFORMIN (GLUCOPHAGE) 500 MG tablet Take 1 tablet (500 mg total) by mouth 2 (two) times daily with a meal.   . Nutritional Supplements (GLUCERNA SNACK SHAKE) LIQD Take 1 Dose by mouth 3 (three) times daily as needed. 05/26/2019: She is drinking vanilla ensure instead of Glucerna  . Olopatadine HCl 0.2 % SOLN Place 1 drop into both eyes daily as needed (dry eyes).  04/17/2019: No record of this in past 6 months per external pharmacy records  . Polyethyl Glycol-Propyl Glycol (SYSTANE) 0.4-0.3 % GEL ophthalmic gel Place 1 application daily as needed into both eyes (dry eyes/irritation).    . prednisoLONE acetate (PRED FORTE) 1 % ophthalmic suspension  04/17/2019: No record of this in past 6 months per external pharmacy records  . rosuvastatin (CRESTOR) 20 MG tablet Take 1 tablet (20 mg total) by mouth daily.   . Semaglutide,0.25 or 0.5MG/DOS, (OZEMPIC, 0.25 OR 0.5 MG/DOSE,) 2 MG/1.5ML SOPN Inject 0.25 mg into the skin once a week.   . venlafaxine XR (EFFEXOR-XR) 150 MG 24 hr capsule TAKE 1 CAPSULE BY MOUTH  DAILY WITH BREAKFAST    No facility-administered encounter medications on file as of 07/04/2020.    Patient Active Problem List   Diagnosis Date Noted  . Urinary incontinence, mixed 06/16/2020  . Vitamin D deficiency 08/17/2019  . Weight loss 08/17/2019  . Carpal tunnel syndrome 06/01/2019  . Cognitive impairment 01/13/2019  . Insomnia 06/11/2018  . Hydronephrosis 04/01/2017  . Acquired scoliosis 02/04/2017  . Degeneration of lumbar  intervertebral disc 02/04/2017  . Osteoarthritis of facet joint of lumbar spine 02/04/2017  . Sciatica of left side without back pain 01/30/2017  . Neuropathy 12/09/2016  . Genetic testing 11/27/2015  . Primary osteoarthritis of first carpometacarpal joint of right hand 07/06/2015  . Preventative health care 08/26/2012  . HTN, goal below 140/90 08/13/2012  . CKD (chronic kidney disease) stage 3, GFR 30-59 ml/min (HCC) 08/13/2012  . Glaucoma associated with systemic syndromes(365.44) 08/07/2012  . Hypothyroidism 04/16/2006  . Anemia 04/16/2006  . Depression 04/16/2006  . LOW BACK PAIN 04/16/2006  . BREAST CANCER, HX OF 04/16/2006  . Hyperlipidemia 10/13/2003  . Type 2 diabetes mellitus with diabetic polyneuropathy, with long-term current use of insulin ([place]) 04/16/1991    Conditions to be addressed/monitored: HTN, DMII and Depression  Care Plan : Diabetes Type 2 (Adult)  Updates made by Johnney Killian, RN since 07/04/2020 12:00 AM    Problem: Glycemic Management (Diabetes, Type 2)     Long-Range Goal: Glycemic Management Optimized   Start  Date: 05/24/2019  Expected End Date: 09/26/2020  Recent Progress: Not on track  Priority: High  Note:   Current Barriers:  . Chronic Disease Management support, education, and care coordination needs related to HTN, HLD, DMII, CKD Stage 3, and Depression- Clinical Goal(s) related to  HTN, HLD, DMII, CKD Stage 3, and Depression  Over the next 30 - 60 days, patient will:  . Work with the care management team to address educational, disease management, and care coordination needs  . Begin or continue self health monitoring activities as directed today Measure and record CBG (blood glucose) 2 times daily and take medications as prescribed . Call provider office for new or worsened signs and symptoms HTN, HLD, DMII, CKD Stage 3, and Depression . Call care management team with questions or concerns . Verbalize basic understanding of patient centered  plan of care established today  Interventions related to HTN, HLD, DMII, CKD Stage 3, and Depression . Prior to interviewing patient, reviewed patient status, including review of recent office visit notes, consultants reports, relevant laboratory and other test results, and medications, completed. . Assessed patient's DM and HTN self management strategies and reviewed blood glucose home readings, assessed for hypoglycemic episodes -Patient noted her blood sugar was 86 this morning and we discussed her blood sugars.  Last A1C on 05/23/20 was 8.5. . Reviewed medications with patient and discussed medication taking behavior- Discussed with patient her inquiry about needing a refill for her Levothyroxine 80mg tabs. Patient [facility]  [facility].  [facility]  Patient states she has the phone number for [facility]. . Discussed patients visit with Dr. MPrudence Davidsonto monitor her toes and the bunion she has and educated on importance of good diabetic foot care.  . Discussed plans with patient for ongoing care management follow up and provided patient with direct contact information for care management team . Discussed patient receiving incontinence supplies from [employer].  Patient has no issues with the supplies.  Patient Self Care Activities related to HTN, HLD, DMII, CKD Stage 3, and Depression Patient is unable to independently self-manage chronic health conditions  Follow up: Care management will plan outreach to patient within 30-60 days     Plan: Telephone follow up appointment with care management team member scheduled for:  4-5 weeks  DJohnney Killian RN, BSN, CCM Care Management Coordinator [facility] Phone: [phone]/ Fax: [phone]

## 2020-07-04 NOTE — Patient Instructions (Addendum)
Visit Information  Diabetes Mellitus and Foot Care Foot care is an important part of your health, especially when you have diabetes. Diabetes may cause you to have problems because of poor blood flow (circulation) to your feet and legs, which can cause your skin to:  Become thinner and drier.  Break more easily.  Heal more slowly.  Peel and crack. You may also have nerve damage (neuropathy) in your legs and feet, causing decreased feeling in them. This means that you may not notice minor injuries to your feet that could lead to more serious problems. Noticing and addressing any potential problems early is the best way to prevent future foot problems. How to care for your feet Foot hygiene  Wash your feet daily with warm water and mild soap. Do not use hot water. Then, pat your feet and the areas between your toes until they are completely dry. Do not soak your feet as this can dry your skin.  Trim your toenails straight across. Do not dig under them or around the cuticle. File the edges of your nails with an emery board or nail file.  Apply a moisturizing lotion or petroleum jelly to the skin on your feet and to dry, brittle toenails. Use lotion that does not contain alcohol and is unscented. Do not apply lotion between your toes.   Shoes and socks  Wear clean socks or stockings every day. Make sure they are not too tight. Do not wear knee-high stockings since they may decrease blood flow to your legs.  Wear shoes that fit properly and have enough cushioning. Always look in your shoes before you put them on to be sure there are no objects inside.  To break in new shoes, wear them for just a few hours a day. This prevents injuries on your feet. Wounds, scrapes, corns, and calluses  Check your feet daily for blisters, cuts, bruises, sores, and redness. If you cannot see the bottom of your feet, use a mirror or ask someone for help.  Do not cut corns or calluses or try to remove them  with medicine.  If you find a minor scrape, cut, or break in the skin on your feet, keep it and the skin around it clean and dry. You may clean these areas with mild soap and water. Do not clean the area with peroxide, alcohol, or iodine.  If you have a wound, scrape, corn, or callus on your foot, look at it several times a day to make sure it is healing and not infected. Check for: ? Redness, swelling, or pain. ? Fluid or blood. ? Warmth. ? Pus or a bad smell.   General tips  Do not cross your legs. This may decrease blood flow to your feet.  Do not use heating pads or hot water bottles on your feet. They may burn your skin. If you have lost feeling in your feet or legs, you may not know this is happening until it is too late.  Protect your feet from hot and cold by wearing shoes, such as at the beach or on hot pavement.  Schedule a complete foot exam at least once a year (annually) or more often if you have foot problems. Report any cuts, sores, or bruises to your health care provider immediately. Where to find more information  American Diabetes Association: www.diabetes.org  Association of Diabetes Care & Education Specialists: www.diabeteseducator.org Contact a health care provider if:  You have a medical condition that increases your risk  of infection and you have any cuts, sores, or bruises on your feet.  You have an injury that is not healing.  You have redness on your legs or feet.  You feel burning or tingling in your legs or feet.  You have pain or cramps in your legs and feet.  Your legs or feet are numb.  Your feet always feel cold.  You have pain around any toenails. Get help right away if:  You have a wound, scrape, corn, or callus on your foot and: ? You have pain, swelling, or redness that gets worse. ? You have fluid or blood coming from the wound, scrape, corn, or callus. ? Your wound, scrape, corn, or callus feels warm to the touch. ? You have pus or  a bad smell coming from the wound, scrape, corn, or callus. ? You have a fever. ? You have a red line going up your leg. Summary  Check your feet every day for blisters, cuts, bruises, sores, and redness.  Apply a moisturizing lotion or petroleum jelly to the skin on your feet and to dry, brittle toenails.  Wear shoes that fit properly and have enough cushioning.  If you have foot problems, report any cuts, sores, or bruises to your health care provider immediately.  Schedule a complete foot exam at least once a year (annually) or more often if you have foot problems. This information is not intended to replace advice given to you by your health care provider. Make sure you discuss any questions you have with your health care provider. Document Revised: 08/05/2019 Document Reviewed: 08/05/2019 Elsevier Patient Education  [place] Addressed              This Visit's Progress   .  Set My Target A1C-Diabetes Type 2- " My son [name] checks my blood sugar and makes sure I eat the right kind of foods." (pt-stated)        Timeframe:  Long-Range Goal Priority:  High Start Date:       05/24/19                      Expected End Date:   09/28/20                   Follow Up Date 7/22   - set target A1C    Why is this important?    Your target A1C is decided together by you and your doctor.   It is based on several things like your age and other health issues.    Notes: 05/23/20- A1C checked in clinic today increased from 7.9% to 8.5%, per patient blood sugars are checked by son [name] , who also has diabetes       The patient verbalized understanding of instructions, educational materials, and care plan provided today and agreed to receive a mailed copy of patient instructions, educational materials, and care plan.   Telephone follow up appointment with care management team member scheduled for: 4-5 weeks  Christy Killian, RN, BSN, CCM Care Management  Coordinator [facility] Internal Medicine Phone: [phone] / Fax: [phone]

## 2020-07-06 ENCOUNTER — Other Ambulatory Visit: Payer: Self-pay

## 2020-07-06 DIAGNOSIS — E039 Hypothyroidism, unspecified: Secondary | ICD-10-CM

## 2020-07-06 MED ORDER — LEVOTHYROXINE SODIUM 50 MCG PO TABS: 50.0000 ug | ORAL_TABLET | Freq: Every day | ORAL | 3 refills | Status: DC

## 2020-07-06 NOTE — Telephone Encounter (Signed)
levothyroxine (SYNTHROID) 50 MCG tablet, refill request @  [facility], [place] [address] Phone:  [phone]  Fax:  [phone]

## 2020-07-11 ENCOUNTER — Encounter: Payer: Self-pay | Admitting: Internal Medicine

## 2020-07-11 ENCOUNTER — Encounter: Payer: Medicare Other | Admitting: Internal Medicine

## 2020-07-14 ENCOUNTER — Telehealth: Payer: Medicare Other

## 2020-07-18 ENCOUNTER — Other Ambulatory Visit: Payer: Self-pay | Admitting: Internal Medicine

## 2020-07-18 DIAGNOSIS — E1142 Type 2 diabetes mellitus with diabetic polyneuropathy: Secondary | ICD-10-CM

## 2020-08-08 ENCOUNTER — Telehealth: Payer: Medicare Other

## 2020-08-08 ENCOUNTER — Telehealth: Payer: Self-pay

## 2020-08-08 NOTE — Telephone Encounter (Signed)
  Care Management   Outreach Note  08/08/2020 Name: [name] MRN: [id number] DOB: 07/08/1950  Referred by: Aldine Contes, MD Reason for referral : No chief complaint on file.   An unsuccessful telephone outreach was attempted today. The patient was referred to the case management team for assistance with care management and care coordination.   Follow Up Plan: The patient has been provided with contact information for the care management team and has been advised to call with any health related questions or concerns.  The care management team will reach out to the patient again over the next 7-14 days.   Johnney Killian, RN, BSN, CCM Care Management Coordinator [facility] Internal Medicine Phone: [phone] / Fax: [phone]

## 2020-08-10 ENCOUNTER — Telehealth: Payer: Self-pay

## 2020-08-10 ENCOUNTER — Ambulatory Visit: Payer: Medicare Other

## 2020-08-10 NOTE — Telephone Encounter (Signed)
  Care Management   Outreach Note  08/10/2020 Name: Linnet Bottari MRN: [id number] DOB: [birth year]  Referred by: Aldine Contes, MD Reason for referral : No chief complaint on file.   An unsuccessful telephone outreach was attempted today. The patient was referred to the case management team for assistance with care management and care coordination.   Follow Up Plan: The patient has been provided with contact information for the care management team and has been advised to call with any health related questions or concerns.   Johnney Killian, RN, BSN, CCM Care Management Coordinator [facility] Internal Medicine Phone: [phone] / Fax: [phone]

## 2020-08-10 NOTE — Chronic Care Management (AMB) (Signed)
Care Management    RN Visit Note  08/10/2020 Name: [name] MRN: [id number] DOB: 1950-11-27  Subjective: [name] is a 70 y.o. year old female who is a primary care patient of Christy Contes, MD. The care management team was consulted for assistance with disease management and care coordination needs.    Engaged with patient by telephone for follow up visit in response to provider referral for case management and/or care coordination services.   Consent to Services:   [name] was given information about Care Management services today including:  Care Management services includes personalized support from designated clinical staff supervised by her physician, including individualized plan of care and coordination with other care providers 24/7 contact phone numbers for assistance for urgent and routine care needs. The patient may stop case management services at any time by phone call to the office staff.  Patient agreed to services and consent obtained.    Assessment: Patient is making progress with diabetes management . See Care Plan below for interventions and patient self-care actives. Follow up Plan: Patient would like continued follow-up.  CCM RNCM will outreach the patient within the next 30 days.  Patient will call office if needed prior to next encounter : Review of patient past medical history, allergies, medications, health status, including review of consultants reports, laboratory and other test data, was performed as part of comprehensive evaluation and provision of chronic care management services.   SDOH (Social Determinants of Health) assessments and interventions performed:    Care Plan  Allergies  Allergen Reactions   Gabapentin Other (See Comments)    Dizziness from 300 mg, tolerates 100 mg Dizziness from 300 mg, tolerates 100 mg    Meclizine Hcl Rash   Metformin Hcl Er Diarrhea    Patient did not tolerate Metformin ER but is able to take lower  dose of Metformin   Penicillins Rash    Has patient had a PCN reaction causing immediate rash, facial/tongue/throat swelling, SOB or lightheadedness with hypotension: Yes Has patient had a PCN reaction causing severe rash involving mucus membranes or skin necrosis: No Has patient had a PCN reaction that required hospitalization: pt was in hospital at time of reaction Has patient had a PCN reaction occurring within the last 10 years: No If all of the above answers are "NO", then may proceed with Cephalosporin use.   Sulfa Antibiotics Rash    Outpatient Encounter Medications as of 08/10/2020  Medication Sig Note   Accu-Chek Softclix Lancets lancets Check blood sugar 5 times a day as instructed    ALPHAGAN P 0.1 % SOLN Place 1 drop into both eyes 3 (three) times daily. 04/17/2019: No record of this in past 6 months per external pharmacy records   Besifloxacin HCl 0.6 % SUSP Besivance 0.6 % eye drops,suspension  INSTILL 1 DROP INTO THE LEFT EYE QID X 2 DAYS AFTER EACH MONTHLY EYE INJECTION 04/17/2019: No record of this in past 6 months per external pharmacy records   Blood Glucose Monitoring Suppl (ACCU-CHEK GUIDE ME) w/Device KIT Check blood sugar 5 times a day    busPIRone (BUSPAR) 5 MG tablet Take 1 tablet (5 mg total) by mouth 2 (two) times daily.    calcium citrate-vitamin D (CITRACAL+D) 315-200 MG-UNIT tablet Take 1 tablet by mouth 2 (two) times daily. (Patient not taking: Reported on 11/20/2017)    Cholecalciferol (VITAMIN D3) 2000 units TABS Take 2,000 Units at bedtime by mouth.     cycloSPORINE (RESTASIS) 0.05 % ophthalmic  emulsion Restasis MultiDose 0.05 % eye drops  INT 1 GTT IN [facility] EYE BID 04/17/2019: No record of this in past 6 months per external pharmacy records   glucose 4 GM chewable tablet Chew 4 tablets (16 g total) by mouth as needed for low blood sugar. 04/17/2019: No record of this in past 6 months per external pharmacy records   glucose blood (ACCU-CHEK GUIDE) test strip Check  blood sugar 5 times per day    glucose blood test strip OneTouch Verio test strips  USE TO CHECK BS BID    Insulin Pen Needle (BD PEN NEEDLE NANO U/F) 32G X 4 MM MISC USE AS DIRECTED    ketorolac (ACULAR) 0.5 % ophthalmic solution Place 1 drop into both eyes 3 (three) times daily. 04/17/2019: No record of this in past 6 months per external pharmacy records   levothyroxine (SYNTHROID) 50 MCG tablet Take 1 tablet (50 mcg total) by mouth daily before breakfast.    metFORMIN (GLUCOPHAGE) 500 MG tablet Take 1 tablet (500 mg total) by mouth 2 (two) times daily with a meal.    Nutritional Supplements (GLUCERNA SNACK SHAKE) LIQD Take 1 Dose by mouth 3 (three) times daily as needed. 05/26/2019: She is drinking vanilla ensure instead of Glucerna   Olopatadine HCl 0.2 % SOLN Place 1 drop into both eyes daily as needed (dry eyes).  04/17/2019: No record of this in past 6 months per external pharmacy records   Polyethyl Glycol-Propyl Glycol (SYSTANE) 0.4-0.3 % GEL ophthalmic gel Place 1 application daily as needed into both eyes (dry eyes/irritation).     prednisoLONE acetate (PRED FORTE) 1 % ophthalmic suspension  04/17/2019: No record of this in past 6 months per external pharmacy records   rosuvastatin (CRESTOR) 20 MG tablet Take 1 tablet (20 mg total) by mouth daily.    Semaglutide,0.25 or 0.5MG/DOS, (OZEMPIC, 0.25 OR 0.5 MG/DOSE,) 2 MG/1.5ML SOPN Inject 0.25 mg into the skin once a week.    venlafaxine XR (EFFEXOR-XR) 150 MG 24 hr capsule TAKE 1 CAPSULE BY MOUTH  DAILY WITH BREAKFAST    No facility-administered encounter medications on file as of 08/10/2020.    Patient Active Problem List   Diagnosis Date Noted   Urinary incontinence, mixed 06/16/2020   Vitamin D deficiency 08/17/2019   Weight loss 08/17/2019   Carpal tunnel syndrome 06/01/2019   Cognitive impairment 01/13/2019   Insomnia 06/11/2018   Hydronephrosis 04/01/2017   Acquired scoliosis 02/04/2017   Degeneration of lumbar intervertebral  disc 02/04/2017   Osteoarthritis of facet joint of lumbar spine 02/04/2017   Sciatica of left side without back pain 01/30/2017   Neuropathy 12/09/2016   Genetic testing 11/27/2015   Primary osteoarthritis of first carpometacarpal joint of right hand 07/06/2015   Preventative health care 08/26/2012   HTN, goal below 140/90 08/13/2012   CKD (chronic kidney disease) stage 3, GFR 30-59 ml/min ([place]) 08/13/2012   Glaucoma associated with systemic syndromes(365.44) 08/07/2012   Hypothyroidism 04/16/2006   Anemia 04/16/2006   Depression 04/16/2006   LOW BACK PAIN 04/16/2006   BREAST CANCER, HX OF 04/16/2006   Hyperlipidemia 10/13/2003   Type 2 diabetes mellitus with diabetic polyneuropathy, with long-term current use of insulin ([place]) 04/16/1991    Conditions to be addressed/monitored: HTN, DMII, and Depression  Care Plan : Diabetes Type 2 (Adult)  Updates made by Johnney Killian, RN since 08/10/2020 12:00 AM     Problem: Glycemic Management (Diabetes, Type 2)      Long-Range Goal: Glycemic Management Optimized  Start Date: 05/24/2019  Expected End Date: 10/23/2020  Recent Progress: Not on track  Priority: High  Note:   Current Barriers: Follow up call made to patient and she stated she is doing well overall. Her blood sugars have been ranging around 100, she has not had any low or high readings.   Chronic Disease Management support, education, and care coordination needs related to HTN, HLD, DMII, CKD Stage 3, and Depression- Clinical Goal(s) related to  HTN, HLD, DMII, CKD Stage 3, and Depression  Over the next 30 - 60 days, patient will:  Work with the care management team to address educational, disease management, and care coordination needs  Begin or continue self health monitoring activities as directed today Measure and record CBG (blood glucose) 2 times daily and take medications as prescribed Call provider office for new or worsened signs and symptoms HTN, HLD, DMII, CKD  Stage 3, and Depression Call care management team with questions or concerns Verbalize basic understanding of patient centered plan of care established today  Interventions related to HTN, HLD, DMII, CKD Stage 3, and Depression Prior to interviewing patient, reviewed patient status, including review of recent office visit notes, consultants reports, relevant laboratory and other test results, and medications, completed. Assessed patient's DM and HTN self management strategies and reviewed blood glucose home readings, assessed for hypoglycemic episodes - Last A1C on 05/23/20 was 8.5. Reviewed medications with patient and discussed medication taking behavior- Patient noted that she does not have a blood pressure cuff at home to monitor her blood pressure and she is interested in getting one.  This RNCM will investigate obtaining one for her through her [place]. Discussed plans with patient for ongoing care management follow up and provided patient with direct contact information for care management team- Patient needs an appt with the clinic and notified scheduler to call patient to set up appt.   Patient Self Care Activities related to HTN, HLD, DMII, CKD Stage 3, and Depression Patient is unable to independently self-manage chronic health conditions  Follow up: Care management will plan outreach to patient within 30-60 days     Plan: Telephone follow up appointment with care management team member scheduled for:  30 days  Johnney Killian, RN, BSN, CCM Care Management Coordinator [facility] Internal Medicine Phone: [phone] / Fax: [phone]

## 2020-08-10 NOTE — Patient Instructions (Signed)
Visit Information   Goals Addressed               This Visit's Progress     Blood Pressure < 140/90        BP Readings from Last 3 Encounters:  04/21/19 (!) 160/80  03/15/19 (!) 156/74  01/13/19 140/68   **RNCM attempting to get patient a home BP monitor  Not meeting targets for SBP      HEMOGLOBIN A1C < 7.0        Lab Results  Component Value Date   HGBA1C 8.5 (A) 05/23/2020     Not meeting Hgb A1C target      LDL CALC < 100        Lab Results  Component Value Date   CHOL 105 05/23/2020   HDL 47 05/23/2020   LDLCALC 43 05/23/2020   TRIG 69 05/23/2020   CHOLHDL 2.2 05/23/2020    Not meeting LDL and total cholesterol target      Set My Target A1C-Diabetes Type 2- " My son [name] checks my blood sugar and makes sure I eat the right kind of foods." (pt-stated)        Timeframe:  Long-Range Goal Priority:  High Start Date:       05/24/19                      Expected End Date:   09/28/20                   Follow Up Date 8/22   - set target A1C    Why is this important?   Your target A1C is decided together by you and your doctor.  It is based on several things like your age and other health issues.            The patient verbalized understanding of instructions, educational materials, and care plan provided today and declined offer to receive copy of patient instructions, educational materials, and care plan.   Telephone follow up appointment with care management team member scheduled for:09/07/2020@11am   Johnney Killian, RN, BSN, CCM Care Management Coordinator [facility] Internal Medicine Phone: [phone] / Fax: [phone]

## 2020-08-17 ENCOUNTER — Encounter: Payer: Medicare Other | Admitting: Student

## 2020-09-07 ENCOUNTER — Telehealth: Payer: Self-pay

## 2020-09-07 ENCOUNTER — Telehealth: Payer: Medicare Other

## 2020-09-07 NOTE — Telephone Encounter (Signed)
  Care Management   Outreach Note  09/07/2020 Name: Sanaria Malave MRN: [id number] DOB: 1950-06-12  Referred by: Aldine Contes, MD Reason for referral : No chief complaint on file.   An unsuccessful telephone outreach was attempted today. The patient was referred to the case management team for assistance with care management and care coordination.   Follow Up Plan: If patient returns call to provider office, please advise to call [place] at [phone].  Johnney Killian, RN, BSN, CCM Care Management Coordinator [facility] Internal Medicine Phone: [phone] / Fax: [phone]

## 2020-09-21 ENCOUNTER — Ambulatory Visit: Payer: Medicare Other

## 2020-09-21 NOTE — Chronic Care Management (AMB) (Signed)
Care Management    RN Visit Note  09/21/2020 Name: [name] MRN: [id number] DOB: [birth year]  Subjective: [name] is a 70 y.o. year old female who is a primary care patient of Aldine Contes, MD. The care management team was consulted for assistance with disease management and care coordination needs.    Engaged with patient by telephone for follow up visit in response to provider referral for case management and/or care coordination services.   Consent to Services:   [name] was given information about Care Management services today including:  Care Management services includes personalized support from designated clinical staff supervised by her physician, including individualized plan of care and coordination with other care providers 24/7 contact phone numbers for assistance for urgent and routine care needs. The patient may stop case management services at any time by phone call to the office staff.  Patient agreed to services and consent obtained.   Assessment: Review of patient past medical history, allergies, medications, health status, including review of consultants reports, laboratory and other test data, was performed as part of comprehensive evaluation and provision of chronic care management services.   SDOH (Social Determinants of Health) assessments and interventions performed:    Care Plan  Allergies  Allergen Reactions   Gabapentin Other (See Comments)    Dizziness from 300 mg, tolerates 100 mg Dizziness from 300 mg, tolerates 100 mg    Meclizine Hcl Rash   Metformin Hcl Er Diarrhea    Patient did not tolerate Metformin ER but is able to take lower dose of Metformin   Penicillins Rash    Has patient had a PCN reaction causing immediate rash, facial/tongue/throat swelling, SOB or lightheadedness with hypotension: Yes Has patient had a PCN reaction causing severe rash involving mucus membranes or skin necrosis: No Has patient had a PCN reaction  that required hospitalization: pt was in hospital at time of reaction Has patient had a PCN reaction occurring within the last 10 years: No If all of the above answers are "NO", then may proceed with Cephalosporin use.   Sulfa Antibiotics Rash    Outpatient Encounter Medications as of 09/21/2020  Medication Sig Note   Accu-Chek Softclix Lancets lancets Check blood sugar 5 times a day as instructed    ALPHAGAN P 0.1 % SOLN Place 1 drop into both eyes 3 (three) times daily. 04/17/2019: No record of this in past 6 months per external pharmacy records   Besifloxacin HCl 0.6 % SUSP Besivance 0.6 % eye drops,suspension  INSTILL 1 DROP INTO THE LEFT EYE QID X 2 DAYS AFTER EACH MONTHLY EYE INJECTION 04/17/2019: No record of this in past 6 months per external pharmacy records   Blood Glucose Monitoring Suppl (ACCU-CHEK GUIDE ME) w/Device KIT Check blood sugar 5 times a day    busPIRone (BUSPAR) 5 MG tablet Take 1 tablet (5 mg total) by mouth 2 (two) times daily.    calcium citrate-vitamin D (CITRACAL+D) 315-200 MG-UNIT tablet Take 1 tablet by mouth 2 (two) times daily. (Patient not taking: Reported on 11/20/2017)    Cholecalciferol (VITAMIN D3) 2000 units TABS Take 2,000 Units at bedtime by mouth.     cycloSPORINE (RESTASIS) 0.05 % ophthalmic emulsion Restasis MultiDose 0.05 % eye drops  INT 1 GTT IN [facility] EYE BID 04/17/2019: No record of this in past 6 months per external pharmacy records   glucose 4 GM chewable tablet Chew 4 tablets (16 g total) by mouth as needed for low blood sugar. 04/17/2019: No  record of this in past 6 months per external pharmacy records   glucose blood (ACCU-CHEK GUIDE) test strip Check blood sugar 5 times per day    glucose blood test strip OneTouch Verio test strips  USE TO CHECK BS BID    Insulin Pen Needle (BD PEN NEEDLE NANO U/F) 32G X 4 MM MISC USE AS DIRECTED    ketorolac (ACULAR) 0.5 % ophthalmic solution Place 1 drop into both eyes 3 (three) times daily. 04/17/2019: No  record of this in past 6 months per external pharmacy records   levothyroxine (SYNTHROID) 50 MCG tablet Take 1 tablet (50 mcg total) by mouth daily before breakfast.    metFORMIN (GLUCOPHAGE) 500 MG tablet Take 1 tablet (500 mg total) by mouth 2 (two) times daily with a meal.    Nutritional Supplements (GLUCERNA SNACK SHAKE) LIQD Take 1 Dose by mouth 3 (three) times daily as needed. 05/26/2019: She is drinking vanilla ensure instead of Glucerna   Olopatadine HCl 0.2 % SOLN Place 1 drop into both eyes daily as needed (dry eyes).  04/17/2019: No record of this in past 6 months per external pharmacy records   Polyethyl Glycol-Propyl Glycol (SYSTANE) 0.4-0.3 % GEL ophthalmic gel Place 1 application daily as needed into both eyes (dry eyes/irritation).     prednisoLONE acetate (PRED FORTE) 1 % ophthalmic suspension  04/17/2019: No record of this in past 6 months per external pharmacy records   rosuvastatin (CRESTOR) 20 MG tablet Take 1 tablet (20 mg total) by mouth daily.    Semaglutide,0.25 or 0.5MG/DOS, (OZEMPIC, 0.25 OR 0.5 MG/DOSE,) 2 MG/1.5ML SOPN Inject 0.25 mg into the skin once a week.    venlafaxine XR (EFFEXOR-XR) 150 MG 24 hr capsule TAKE 1 CAPSULE BY MOUTH  DAILY WITH BREAKFAST    No facility-administered encounter medications on file as of 09/21/2020.    Patient Active Problem List   Diagnosis Date Noted   Urinary incontinence, mixed 06/16/2020   Vitamin D deficiency 08/17/2019   Weight loss 08/17/2019   Carpal tunnel syndrome 06/01/2019   Cognitive impairment 01/13/2019   Insomnia 06/11/2018   Hydronephrosis 04/01/2017   Acquired scoliosis 02/04/2017   Degeneration of lumbar intervertebral disc 02/04/2017   Osteoarthritis of facet joint of lumbar spine 02/04/2017   Sciatica of left side without back pain 01/30/2017   Neuropathy 12/09/2016   Genetic testing 11/27/2015   Primary osteoarthritis of first carpometacarpal joint of right hand 07/06/2015   Preventative health care  08/26/2012   HTN, goal below 140/90 08/13/2012   CKD (chronic kidney disease) stage 3, GFR 30-59 ml/min ([place]) 08/13/2012   Glaucoma associated with systemic syndromes(365.44) 08/07/2012   Hypothyroidism 04/16/2006   Anemia 04/16/2006   Depression 04/16/2006   LOW BACK PAIN 04/16/2006   BREAST CANCER, HX OF 04/16/2006   Hyperlipidemia 10/13/2003   Type 2 diabetes mellitus with diabetic polyneuropathy, with long-term current use of insulin ([place]) 04/16/1991    Conditions to be addressed/monitored: HTN, DMII, and CKD Stage 3  Care Plan : Diabetes Type 2 (Adult)  Updates made by Johnney Killian, RN since 09/21/2020 12:00 AM     Problem: Glycemic Management (Diabetes, Type 2)      Long-Range Goal: Glycemic Management Optimized   Start Date: 05/24/2019  Expected End Date: 10/27/2020  Recent Progress: Not on track  Priority: High  Note:   Current Barriers:  Successful outreach to patient this afternoon.  She states she is doing well and has no complaints.  This RNCM inquired about her  CBG readings and she noted her son checks her readings and she does not have access to her CBG history.  She states that they have not been too low or high. Patient was a no show for her 08/17/20 appointment at [facility]  for follow up and she did not recall having an appointment. 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 Chronic Disease Management support, education, and care coordination needs related to HTN, HLD, DMII, CKD Stage 3, and Depression- Clinical Goal(s) related to  HTN, HLD, DMII, CKD Stage 3, and Depression  Over the next 30 - 60 days, patient will:  Work with the care management team to address educational, disease management, and care coordination needs  Begin or continue self health monitoring activities as directed today Measure and record CBG (blood glucose) 2 times daily and take medications as prescribed Call provider office for new or worsened signs and symptoms HTN,  HLD, DMII, CKD Stage 3, and Depression Call care management team with questions or concerns Verbalize basic understanding of patient centered plan of care established today  Interventions related to HTN, HLD, DMII, CKD Stage 3, and Depression Prior to interviewing patient, reviewed patient status, including review of recent office visit notes, consultants reports, relevant laboratory and other test results, and medications, completed. Assessed patient's DM and HTN self management strategies and reviewed blood glucose home readings, assessed for hypoglycemic episodes - Last A1C on 05/23/20 was 8.5. Reviewed medications with patient and discussed medication taking behavior Discussed plans with patient for ongoing care management follow up and provided patient with direct contact information for care management team- Patient needs an appt with the clinic and notified scheduler to call patient to reschedule appt.   Patient Self Care Activities related to HTN, HLD, DMII, CKD Stage 3, and Depression Patient is unable to independently self-manage chronic health conditions  Follow up: Care management will plan outreach to patient within 30-60 days     Plan: Telephone follow up appointment with care management team member scheduled for:  60 days  Johnney Killian, RN, BSN, CCM Care Management Coordinator [facility] Internal Medicine Phone: [phone] / Fax: [phone]

## 2020-09-21 NOTE — Patient Instructions (Signed)
Visit Information   Goals Addressed               This Visit's Progress     Blood Pressure < 140/90        BP Readings from Last 3 Encounters:  05/23/20 138/71  10/19/19 128/77  08/17/19 (!) 89/55   **RNCM attempting to get patient a home BP monitor  Not meeting targets for SBP      HEMOGLOBIN A1C < 7.0        Lab Results  Component Value Date   HGBA1C 8.5 (A) 05/23/2020     Not meeting Hgb A1C target      LDL CALC < 100        Lab Results  Component Value Date   CHOL 105 05/23/2020   HDL 47 05/23/2020   LDLCALC 43 05/23/2020   TRIG 69 05/23/2020   CHOLHDL 2.2 05/23/2020    Not meeting LDL and total cholesterol target      Set My Target A1C-Diabetes Type 2- " My son [name] checks my blood sugar and makes sure I eat the right kind of foods." (pt-stated)        Timeframe:  Long-Range Goal Priority:  High Start Date:       05/24/19                      Expected End Date: ongoing                   Follow Up Date    - set target A1C    Why is this important?   Your target A1C is decided together by you and your doctor.  It is based on several things like your age and other health issues.    Notes:         The patient verbalized understanding of instructions, educational materials, and care plan provided today and declined offer to receive copy of patient instructions, educational materials, and care plan.   Telephone follow up appointment with care management team member scheduled for: 10/20/20'@0930'$ .  Johnney Killian, RN, BSN, CCM Care Management Coordinator [facility] Phone: [phone] / Fax: [phone]

## 2020-10-12 ENCOUNTER — Encounter: Payer: Medicare Other | Admitting: Student

## 2020-10-20 ENCOUNTER — Telehealth: Payer: Self-pay

## 2020-10-20 ENCOUNTER — Telehealth: Payer: Medicare Other

## 2020-10-20 NOTE — Telephone Encounter (Signed)
  Care Management   Outreach Note  10/20/2020 Name: [name] MRN: [id number] DOB: [birth year]  Referred by: Aldine Contes, MD Reason for referral : No chief complaint on file.   An unsuccessful telephone outreach was attempted today. The patient was referred to the case management team for assistance with care management and care coordination.   Follow Up Plan: If patient returns call to provider office, please advise to call [place] at [phone].  Johnney Killian, RN, BSN, CCM Care Management Coordinator [facility] Phone: [phone] / Fax: [phone]

## 2020-11-09 DIAGNOSIS — R32 Unspecified urinary incontinence: Secondary | ICD-10-CM | POA: Diagnosis not present

## 2020-12-07 ENCOUNTER — Ambulatory Visit (INDEPENDENT_AMBULATORY_CARE_PROVIDER_SITE_OTHER): Payer: Medicare HMO | Admitting: Student

## 2020-12-07 VITALS — BP 138/74 | HR 88 | Temp 98.3°F | Ht 63.0 in | Wt 103.8 lb

## 2020-12-07 DIAGNOSIS — Z794 Long term (current) use of insulin: Secondary | ICD-10-CM | POA: Diagnosis not present

## 2020-12-07 DIAGNOSIS — Z23 Encounter for immunization: Secondary | ICD-10-CM

## 2020-12-07 DIAGNOSIS — R634 Abnormal weight loss: Secondary | ICD-10-CM | POA: Diagnosis not present

## 2020-12-07 DIAGNOSIS — F32A Depression, unspecified: Secondary | ICD-10-CM

## 2020-12-07 DIAGNOSIS — E1142 Type 2 diabetes mellitus with diabetic polyneuropathy: Secondary | ICD-10-CM

## 2020-12-07 DIAGNOSIS — I10 Essential (primary) hypertension: Secondary | ICD-10-CM

## 2020-12-07 LAB — POCT GLYCOSYLATED HEMOGLOBIN (HGB A1C): Hemoglobin A1C: 8.4 % — AB (ref 4.0–5.6)

## 2020-12-07 LAB — GLUCOSE, CAPILLARY: Glucose-Capillary: 285 mg/dL — ABNORMAL HIGH (ref 70–99)

## 2020-12-07 MED ORDER — BUSPIRONE HCL 5 MG PO TABS
5.0000 mg | ORAL_TABLET | Freq: Two times a day (BID) | ORAL | 3 refills | Status: DC
Start: 2020-12-07 — End: 2021-12-03

## 2020-12-07 MED ORDER — ROSUVASTATIN CALCIUM 20 MG PO TABS: 20.0000 mg | ORAL_TABLET | Freq: Every day | ORAL | 3 refills | Status: DC

## 2020-12-07 NOTE — Assessment & Plan Note (Signed)
BP Readings from Last 3 Encounters:  12/07/20 138/74  05/23/20 138/71  10/19/19 128/77   Blood pressure stable from last visit. She denies chest pain, dyspnea, syncopal episodes. She is not currently on antihypertensives and last BMP in April of this year unremarkable.  - BMET at next visit

## 2020-12-07 NOTE — Patient Instructions (Signed)
Ms.Shanah Corlew, it was a pleasure seeing you today!  Today we discussed: - Diabetes: Your A1c today is 8.4%. We can go ahead and stop your Ozempic.   I have ordered the following labs today:   Lab Orders         Glucose, capillary         POC Hbg A1C      I have ordered the following medication/changed the following medications:   Start the following medications: Meds ordered this encounter  Medications   rosuvastatin (CRESTOR) 20 MG tablet    Sig: Take 1 tablet (20 mg total) by mouth daily.    Dispense:  90 tablet    Refill:  3   busPIRone (BUSPAR) 5 MG tablet    Sig: Take 1 tablet (5 mg total) by mouth 2 (two) times daily.    Dispense:  180 tablet    Refill:  3     Follow-up: 3 months   Please make sure to arrive 15 minutes prior to your next appointment. If you arrive late, you may be asked to reschedule.   We look forward to seeing you next time. Please call our clinic at [phone] if you have any questions or concerns. The best time to call is Monday-Friday from 9am-4pm, but there is someone available 24/7. If after hours or the weekend, call the main hospital number and ask for the Internal Medicine Resident On-Call. If you need medication refills, please notify your pharmacy one week in advance and they will send [place] a request.  Thank you for letting us take part in your care. Wishing you the best!  Thank you, Sanjuan Dame, MD

## 2020-12-07 NOTE — Assessment & Plan Note (Addendum)
Lab Results  Component Value Date   HGBA1C 8.4 (A) 12/07/2020   A1c today 8.4% from 8.5% 6 months ago. Patient is compliant with metformin 500mg  twice daily and Ozempic 0.25mg  weekly. Patient reports home glucose readings in the 100's consistently. Denies polyuria, polydipsia, diarrhea, abdominal pain.   Of note, patient as steadily been losing weight. Today she is 103lb, down from 106lb earlier this year. Given Ozempic is on starting dose, we will d/c today. Foot exam completed today. She reports she will make an appointment to see ophthalmologist. If A1c worsens, can consider DDP-4 inhibitor.   - Stop Ozempic - Continue metformin 500mg  twice daily - Return for A1c check in 41mo with PCP - Podiatry & ophthalmology referral

## 2020-12-07 NOTE — Assessment & Plan Note (Signed)
As patient continues to lose weight (103lb from 108lb), we will stop Ozempic today. Can consider switching from Effexor to mirtazapine for appetite stimulant at next visit.

## 2020-12-07 NOTE — Progress Notes (Signed)
   CC: routine follow-up  HPI:  [name] is a 70 y.o. with type 2 diabetes mellitus, hyperthyroidism, CKDIIIa, depression, subclinical hypothyroidism presenting to [facility] for routine follow-up.  Please see problem-based list for further details.  Past Medical History:  Diagnosis Date   Arthritis    Bilateral hip pain 02/24/2015   Borderline glaucoma of both eyes    Depression    Diabetic polyneuropathy ([place])    Diverticulosis of colon    Dizziness 12/05/2017   Dry eyes, bilateral    Feeling of incomplete bladder emptying    Frequent falls 01/08/2018   History of breast cancer oncologist-  dr Waymon Budge-- per lov note no recurrence   dx 04/ 1999 --- Stage 3B-- s/p  chemotherapy then right mastectomy then concurrent chemoradiation therapy   History of urinary retention    01/ 2018   Hydronephrosis of right kidney    Hyperlipidemia    Hypertension    Hypokalemia 12/09/2016   Hypomagnesemia 12/09/2016   Hypothyroidism    Insulin dependent type 2 diabetes mellitus [facility])    endocrinologist-  dr Cruzita Lederer---  last A1c 8.7 on 02-20-2016   Left leg pain 03/15/2019   Lymphedema of upper extremity    right   Macular degeneration, left eye    followed by dr Zigmund Daniel   Renal insufficiency    Seizure-like activity ([place]) 04/17/2019   Urgency of urination    Visual hallucinations 01/12/2019   Review of Systems:  As per HPI  Physical Exam:  Vitals:   12/07/20 1537  BP: 138/74  Pulse: 88  Temp: 98.3 F (36.8 C)  TempSrc: Oral  SpO2: 100%  Weight: 103 lb 12.8 oz (47.1 kg)  Height: 5\' 3"  (1.6 m)   General: Resting comfortably in chair, no acute distress CV: Regular rate, rhythm. No murmurs appreciated. Distal pulses 2+ bilaterally. Pulm: Normal work of breathing on room air. Clear to auscultation bilaterally. GI: Abdomen soft, non-tender, non-distended. Normoactive bowel sounds. MSK: Normal bulk, tone. No pitting edema bilaterally. Neuro: Awake, alert, conversing  appropriately. No focal deficits. Psych: Normal mood, affect, speech.  Assessment & Plan:   See Encounters Tab for problem based charting.  Patient discussed with Dr. Heber [place]

## 2020-12-07 NOTE — Assessment & Plan Note (Signed)
Patient reports overall good mood. She has been stable on Effexor 150mg  daily and buspirone 5mg  twice daily. PHQ-9 score today is 1. We will continue with current regimen. Can consider switching to mirtazapine at next visit for appetite stimulant.  - Effexor 150mg  daily - Buspirone 5mg  twice daily - Consider switching to mirtazapine

## 2020-12-11 ENCOUNTER — Ambulatory Visit: Payer: Medicare Other | Admitting: Podiatry

## 2020-12-27 ENCOUNTER — Encounter (INDEPENDENT_AMBULATORY_CARE_PROVIDER_SITE_OTHER): Payer: Medicaid Other | Admitting: Podiatry

## 2020-12-27 ENCOUNTER — Encounter: Payer: Self-pay | Admitting: Podiatry

## 2020-12-27 NOTE — Progress Notes (Signed)
This encounter was created in error - please disregard.

## 2020-12-28 ENCOUNTER — Telehealth: Payer: Self-pay

## 2020-12-28 NOTE — Telephone Encounter (Signed)
Pt is requesting a call back .. she stated that her son would like to speak to the Dr about her medicine that she takes ( DIABETIC )

## 2020-12-28 NOTE — Telephone Encounter (Signed)
Per Ms. Dumas son, Dr. Dareen Piano stopped ozempic and he doe snot feel that the metformin is enough.  After 1 week without ozempic, blood sugar went up and stayed in 200s. He gave her ozempic last Wednesday to bring blood sugar down. He wants to know if there is something that doesn't cause weight loss he can give her to control her blood sugars. Her blood sugar today is 295 mg/dL. He was going to give her another dose of ozempic, but I asked him to hold it for now until he hears back from [place] about alternative medications. I urged him to give her plenty of sugar free liquids while her blood sugar  is > 250 mg/dL

## 2020-12-29 ENCOUNTER — Ambulatory Visit (INDEPENDENT_AMBULATORY_CARE_PROVIDER_SITE_OTHER): Payer: Medicare HMO | Admitting: Internal Medicine

## 2020-12-29 DIAGNOSIS — E1142 Type 2 diabetes mellitus with diabetic polyneuropathy: Secondary | ICD-10-CM | POA: Diagnosis not present

## 2020-12-29 DIAGNOSIS — Z794 Long term (current) use of insulin: Secondary | ICD-10-CM

## 2020-12-29 NOTE — Progress Notes (Signed)
   I connected with  [name] on 12/29/20 by telephone and verified that I am speaking with the correct person using two identifiers.   I discussed the limitations of evaluation and management by telemedicine. The patient expressed understanding and agreed to proceed.  CC: hyperglycemia  This is a telephone encounter between [name] and [name] on 12/29/2020 for hyperglycemia. The visit was conducted with the patient located at home and [name] at [facility]. The patient's identity was confirmed using their DOB and current address. The patient has consented to being evaluated through a telephone encounter and understands the associated risks (an examination cannot be done and the patient may need to come in for an appointment) / benefits (allows the patient to remain at home, decreasing exposure to coronavirus). I personally spent 9 minutes on medical discussion.   HPI:  [name] is a 71 y.o. with PMH as below.   Please see A&P for assessment of the patient's acute and chronic medical conditions.   Past Medical History:  Diagnosis Date   Arthritis    Bilateral hip pain 02/24/2015   Borderline glaucoma of both eyes    Depression    Diabetic polyneuropathy ([place])    Diverticulosis of colon    Dizziness 12/05/2017   Dry eyes, bilateral    Feeling of incomplete bladder emptying    Frequent falls 01/08/2018   History of breast cancer oncologist-  dr Waymon Budge-- per lov note no recurrence   dx 04/ 1999 --- Stage 3B-- s/p  chemotherapy then right mastectomy then concurrent chemoradiation therapy   History of urinary retention    01/ 2018   Hydronephrosis of right kidney    Hyperlipidemia    Hypertension    Hypokalemia 12/09/2016   Hypomagnesemia 12/09/2016   Hypothyroidism    Insulin dependent type 2 diabetes mellitus [facility])    endocrinologist-  dr Cruzita Lederer---  last A1c 8.7 on 02-20-2016   Left leg pain 03/15/2019   Lymphedema of upper extremity    right   Macular  degeneration, left eye    followed by dr Zigmund Daniel   Renal insufficiency    Seizure-like activity ([place]) 04/17/2019   Urgency of urination    Visual hallucinations 01/12/2019   Review of Systems: see problem bases assessment and plan.  Assessment & Plan:   See Encounters Tab for problem based charting.  Patient discussed with Dr. Dareen Piano

## 2020-12-29 NOTE — Telephone Encounter (Signed)
Telehealth appointment scheduled for this afternoon at 1:15 pm with Dr. [name].

## 2020-12-30 MED ORDER — EMPAGLIFLOZIN 10 MG PO TABS: 10.0000 mg | ORAL_TABLET | Freq: Every day | ORAL | 0 refills | Status: AC

## 2020-12-31 ENCOUNTER — Encounter: Payer: Self-pay | Admitting: Internal Medicine

## 2020-12-31 NOTE — Assessment & Plan Note (Addendum)
Patient states that she has been having elevated blood sugars since stopping ozempic. She denies any overt symptoms of hyperglycemia. Additionally she has never had DKA in the past and denies frequent perineal infections.  Plan: - Will add empagliflozin 10 mg daily to her DM medication management.  - Will likely need a follow up within 1 month to assess for titration of the medication.

## 2021-01-01 NOTE — Progress Notes (Signed)
Internal Medicine Clinic Attending  Case discussed with Dr. Coe  At the time of the visit.  We reviewed the resident's history and exam and pertinent patient test results.  I agree with the assessment, diagnosis, and plan of care documented in the resident's note.  

## 2021-01-30 ENCOUNTER — Telehealth: Payer: Self-pay | Admitting: *Deleted

## 2021-01-30 ENCOUNTER — Other Ambulatory Visit: Payer: Self-pay | Admitting: Internal Medicine

## 2021-01-30 MED ORDER — EMPAGLIFLOZIN 10 MG PO TABS: 10.0000 mg | ORAL_TABLET | Freq: Every day | ORAL | 2 refills | Status: DC

## 2021-01-30 NOTE — Telephone Encounter (Signed)
Call to pt's son [name] - informed to stop Ozempic d/t pt's weight loss and replaced with Jardiance. He stated the doctor did not state, at the visit, he was going to order Jardiance . Informed pt will take 10mg  daily. And schedule a f/u appt in 4-6 weeks. Stated he understands and call transferred to front office to schedule the appt.

## 2021-01-30 NOTE — Telephone Encounter (Signed)
Call from pt's son [name]. Stated the doctor wanted pt to stop Ozempic ( see 12/07/20 OV)  in which she did but about 1 week after stopping it, her BS's increased (high 200's). So he started giving it back to his mom and now her BS's are in the lower 100's. He wants to know should continue giving her the Ozempic or what does the doctor wants him to do?

## 2021-01-30 NOTE — Progress Notes (Signed)
Chart reviewed. Patient was instructed to start empagliflozin 10 mg daily for hyperglycemia 12/29/20 after discontinuing Ozempic at previous visit. It appears she never filled this prescription per pharmacy. Will send new script today, f/u in 4-6 weeks.

## 2021-02-19 ENCOUNTER — Ambulatory Visit: Payer: Medicare HMO | Admitting: Podiatry

## 2021-02-21 ENCOUNTER — Encounter: Payer: Medicare HMO | Admitting: Internal Medicine

## 2021-02-26 ENCOUNTER — Ambulatory Visit (INDEPENDENT_AMBULATORY_CARE_PROVIDER_SITE_OTHER): Payer: 59 | Admitting: Internal Medicine

## 2021-02-26 ENCOUNTER — Encounter: Payer: Self-pay | Admitting: Internal Medicine

## 2021-02-26 ENCOUNTER — Other Ambulatory Visit: Payer: Self-pay

## 2021-02-26 VITALS — BP 137/88 | HR 93 | Temp 99.4°F | Resp 28 | Ht 63.0 in | Wt 111.9 lb

## 2021-02-26 DIAGNOSIS — F32A Depression, unspecified: Secondary | ICD-10-CM

## 2021-02-26 DIAGNOSIS — E039 Hypothyroidism, unspecified: Secondary | ICD-10-CM

## 2021-02-26 DIAGNOSIS — I10 Essential (primary) hypertension: Secondary | ICD-10-CM

## 2021-02-26 DIAGNOSIS — Z794 Long term (current) use of insulin: Secondary | ICD-10-CM | POA: Diagnosis not present

## 2021-02-26 DIAGNOSIS — E1142 Type 2 diabetes mellitus with diabetic polyneuropathy: Secondary | ICD-10-CM

## 2021-02-26 LAB — GLUCOSE, CAPILLARY: Glucose-Capillary: 257 mg/dL — ABNORMAL HIGH (ref 70–99)

## 2021-02-26 LAB — POCT GLYCOSYLATED HEMOGLOBIN (HGB A1C): Hemoglobin A1C: 9.3 % — AB (ref 4.0–5.6)

## 2021-02-26 MED ORDER — EMPAGLIFLOZIN 25 MG PO TABS: 25.0000 mg | ORAL_TABLET | Freq: Every day | ORAL | 1 refills | Status: DC

## 2021-02-26 MED ORDER — METFORMIN HCL ER 500 MG PO TB24: 1000.0000 mg | ORAL_TABLET | Freq: Every day | ORAL | 3 refills | Status: DC

## 2021-02-26 MED ORDER — ACCU-CHEK GUIDE VI STRP: ORAL_STRIP | 11 refills | Status: DC

## 2021-02-26 NOTE — Progress Notes (Signed)
CC: Routine health appointment  HPI:  [name] is a 71 y.o. female with a past medical history stated below and presents today for routine health appointment. Please see problem based assessment and plan for additional details.  Past Medical History:  Diagnosis Date   Arthritis    Bilateral hip pain 02/24/2015   Borderline glaucoma of both eyes    Depression    Diabetic polyneuropathy ([place])    Diverticulosis of colon    Dizziness 12/05/2017   Dry eyes, bilateral    Feeling of incomplete bladder emptying    Frequent falls 01/08/2018   History of breast cancer oncologist-  dr Waymon Budge-- per lov note no recurrence   dx 04/ 1999 --- Stage 3B-- s/p  chemotherapy then right mastectomy then concurrent chemoradiation therapy   History of urinary retention    01/ 2018   Hydronephrosis of right kidney    Hyperlipidemia    Hypertension    Hypokalemia 12/09/2016   Hypomagnesemia 12/09/2016   Hypothyroidism    Insulin dependent type 2 diabetes mellitus [facility])    endocrinologist-  dr Cruzita Lederer---  last A1c 8.7 on 02-20-2016   Left leg pain 03/15/2019   Lymphedema of upper extremity    right   Macular degeneration, left eye    followed by dr Zigmund Daniel   Renal insufficiency    Seizure-like activity ([place]) 04/17/2019   Urgency of urination    Visual hallucinations 01/12/2019    Current Outpatient Medications on File Prior to Visit  Medication Sig Dispense Refill   Accu-Chek Softclix Lancets lancets Check blood sugar 5 times a day as instructed 200 each 10   ALPHAGAN P 0.1 % SOLN Place 1 drop into both eyes 3 (three) times daily.  2   Besifloxacin HCl 0.6 % SUSP Besivance 0.6 % eye drops,suspension  INSTILL 1 DROP INTO THE LEFT EYE QID X 2 DAYS AFTER EACH MONTHLY EYE INJECTION     Blood Glucose Monitoring Suppl (ACCU-CHEK GUIDE ME) w/Device KIT Check blood sugar 5 times a day 1 kit 1   busPIRone (BUSPAR) 5 MG tablet Take 1 tablet (5 mg total) by mouth 2 (two) times daily. 180  tablet 3   calcium citrate-vitamin D (CITRACAL+D) 315-200 MG-UNIT tablet Take 1 tablet by mouth 2 (two) times daily. (Patient not taking: Reported on 11/20/2017) 100 tablet 3   Cholecalciferol (VITAMIN D3) 2000 units TABS Take 2,000 Units at bedtime by mouth.      cycloSPORINE (RESTASIS) 0.05 % ophthalmic emulsion Restasis MultiDose 0.05 % eye drops  INT 1 GTT IN EACH EYE BID     empagliflozin (JARDIANCE) 10 MG TABS tablet Take 1 tablet (10 mg total) by mouth daily. 30 tablet 2   glucose 4 GM chewable tablet Chew 4 tablets (16 g total) by mouth as needed for low blood sugar. 50 tablet 12   glucose blood (ACCU-CHEK GUIDE) test strip Check blood sugar 5 times per day 150 each 11   glucose blood test strip OneTouch Verio test strips  USE TO CHECK BS BID     Insulin Pen Needle (BD PEN NEEDLE NANO U/F) 32G X 4 MM MISC USE AS DIRECTED 100 each 6   ketorolac (ACULAR) 0.5 % ophthalmic solution Place 1 drop into both eyes 3 (three) times daily.  12   levothyroxine (SYNTHROID) 50 MCG tablet Take 1 tablet (50 mcg total) by mouth daily before breakfast. 90 tablet 3   metFORMIN (GLUCOPHAGE) 500 MG tablet Take 1 tablet (500 mg total) by mouth  2 (two) times daily with a meal. 60 tablet 11   Nutritional Supplements (GLUCERNA SNACK SHAKE) LIQD Take 1 Dose by mouth 3 (three) times daily as needed. 30 Bottle 3   Olopatadine HCl 0.2 % SOLN Place 1 drop into both eyes daily as needed (dry eyes).      Polyethyl Glycol-Propyl Glycol (SYSTANE) 0.4-0.3 % GEL ophthalmic gel Place 1 application daily as needed into both eyes (dry eyes/irritation).      prednisoLONE acetate (PRED FORTE) 1 % ophthalmic suspension      rosuvastatin (CRESTOR) 20 MG tablet Take 1 tablet (20 mg total) by mouth daily. 90 tablet 3   venlafaxine XR (EFFEXOR-XR) 150 MG 24 hr capsule TAKE 1 CAPSULE BY MOUTH  DAILY WITH BREAKFAST 90 capsule 3   No current facility-administered medications on file prior to visit.    Family History  Problem  Relation Age of Onset   Heart disease Father    Diabetes Father    Stroke Sister    Anuerysm Sister 56       brain; maternal half-sister   Heart disease Brother    Anuerysm Brother 23       aortic; maternal half-brother   Breast cancer Daughter 66       negative genetic testing in 2015   Heart attack Daughter        46-47   Diabetes Paternal Grandmother    Stroke Paternal Grandfather    Anuerysm Brother        NOS type; full brother   Fibroids Daughter        s/p hysterectomy at 77y   Brain cancer Maternal Uncle        dx. older than 13; NOS type   Brain cancer Cousin        maternal 1st cousin; d. early 51s; NOS type   Deafness Paternal Uncle        prelingual    Social History: Lives with her brother and other family members.  Has another brother that helps with medications.  Manages most ADLs except for bathing which she has help with from family.  Spends most of her time sitting or lying down at home.  When she does ambulate she ambulates with a cane.  Uses wheelchair for longer distances such as to go to [place].  Review of Systems: ROS negative except for what is noted on the assessment and plan.  Vitals:   02/26/21 1459 02/26/21 1506  BP: (!) 160/86 (!) 150/82  Pulse: 96 97  Resp: (!) 28   Temp: 99.4 F (37.4 C)   TempSrc: Oral   SpO2: 100%   Weight: 111 lb 14.4 oz (50.8 kg)   Height: 5' 3"  (1.6 m)      Physical Exam: General: Well appearing, well-groomed, elderly African-American female, low to normal weight, NAD HENT: normocephalic, atraumatic EYES: conjunctiva non-erythematous, no scleral icterus CV: regular rate, normal rhythm, no murmurs, rubs, gallops.  No lower extremity edema. Pulmonary: normal work of breathing on RA, lungs clear to auscultation, no rales, wheezes, rhonchi Abdominal: non-distended, soft, non-tender to palpation, normal BS Skin: Warm and dry, no rashes or lesions Neurological: MS: awake, alert and oriented x3, normal speech and fund of  knowledge Motor: moves all extremities antigravity Psych: normal affect    Assessment & Plan:   See Encounters Tab for problem based charting.  Patient discussed with Dr. Jilda Panda, M.D. [facility], PGY-1 Pager: [phone] Date 02/26/2021 Time 3:09 PM

## 2021-02-26 NOTE — Assessment & Plan Note (Addendum)
Blood pressure initially 160/86.  On recheck 137/88.  Currently she is not on antihypertensives. ° °Blood pressure currently adequately controlled.  Last BMP April 2022 unremarkable.  Will order repeat BMP today.  Patient son reports they have a blood pressure cuff at home.  Encouraged to check blood pressures at home. ° °Plan: °-BMP today °-At home blood pressure checks ° °ADDENDUM:  BMP with glucose 217, Cr 1.05 and eGFR 57, otherwise unremarkable °

## 2021-02-26 NOTE — Patient Instructions (Addendum)
Thank you, Ms.Angus Palms for allowing [place] to provide your care today. Today we discussed:  Diabetes: Today we increased your Jardiance to 25 mg once a day.  Continue taking your metformin ER 1000 mg.  Please check your blood sugars, once in the morning before you eat breakfast.  You can also check 1 to 2 hours after a meal.  Please record the blood sugars and bring them to your next appointment.  Today we will check your kidney function as well as you are thyroid function labs.  Please schedule an exam with your ophthalmologist to discuss your eyedrops and to get a diabetic eye exam.  I have ordered the following labs for you:   Lab Orders         Glucose, capillary         BMP8+Anion Gap         TSH         POC Hbg A1C      I will call if any are abnormal. All of your labs can be accessed through "My Chart".   My Chart Access: [email or web address]  Please follow-up in 3 months for hemoglobin A1c check.  Please make sure to arrive 15 minutes prior to your next appointment. If you arrive late, you may be asked to reschedule.    We look forward to seeing you next time. Please call our clinic at [phone] if you have any questions or concerns. The best time to call is Monday-Friday from 9am-4pm, but there is someone available 24/7. If after hours or the weekend, call the main hospital number and ask for the Internal Medicine Resident On-Call. If you need medication refills, please notify your pharmacy one week in advance and they will send [place] a request.   Thank you for letting us take part in your care. Wishing you the best!  Wayland Denis, MD 02/26/2021, 4:11 PM IM Resident, PGY-1

## 2021-02-26 NOTE — Assessment & Plan Note (Signed)
Patient reports feeling well.  Currently on Effexor 150 mg daily and buspirone 5 mg twice daily.  PHQ-9 score of 4 today.  Patient has had improvement in appetite after discontinuation of Ozempic.  She has gained 8 pounds since last OV.  If patient is having difficulty with weight loss in the future could consider switching Effexor to mirtazapine, though has had improvement in weight and this is reassuring.  Plan: -Continue Effexor 150 mg daily -Continue buspirone 5 mg twice daily

## 2021-02-26 NOTE — Assessment & Plan Note (Addendum)
Last TSH elevated to 7.93 on 04/2020.  Free T4 was normal.  Patient endorses adherence to levothyroxine 50 MCG daily.  Discussed how to properly take the medication, 30 minutes prior to eating breakfast or any other medication intake.  Patient's son ensures that patient has been taking the medication as instructed.  Plan: -Recheck TSH -Continue Synthroid 50 mcg daily before breakfast  ADDENDUM: TSH 0.486 normal. Hypothyroidism well managed on current dose of thyroid med. Continue current dose of synthroid.

## 2021-02-26 NOTE — Assessment & Plan Note (Addendum)
Hemoglobin A1c November 2022 was 8.4%.  Hemoglobin A1c today 9.3%.  Patient's son reports that she missed only few doses of her diabetes medication last week and otherwise has been adherent to medication regimen.  Uses pillbox at home which son helps with.  Checks random blood glucose occasionally which are 140s to 150s.  Patient has had improvement in appetite since discontinuing Ozempic and has gained 8 pounds (now 111 pounds).  Is using Glucerna shakes.  Otherwise eats oatmeal and mostly vegetables.  Denies new numbness/tingling/burning pain in her hands and feet.  Denies polydipsia polyuria.  Encouraged to see eye doctor given patient has a history of glaucoma and reports not currently taking any eyedrops recently.  Patient reports doing well on Jardiance 10 mg and metformin 1000 mg ER 24-hour.  On assessment, given increase in hemoglobin A1c patient's blood sugars currently poorly controlled on current regimen.  We will increase Jardiance today.  Plan: -Continue metformin 1000 mg ER 24-hour  -Increase Jardiance 25 mg daily -Discussed checking daily morning fasting blood sugars and either writing down the numbers or bring in her glucometer to next appointment. -Refilled glucose test strips -Patient will make appointment to see eye doctor -Follow-up in 3 months for hemoglobin A1c check

## 2021-02-27 LAB — BMP8+ANION GAP
Anion Gap: 17 mmol/L (ref 10.0–18.0)
BUN/Creatinine Ratio: 20 (ref 12–28)
BUN: 21 mg/dL (ref 8–27)
CO2: 24 mmol/L (ref 20–29)
Calcium: 9.9 mg/dL (ref 8.7–10.3)
Chloride: 99 mmol/L (ref 96–106)
Creatinine, Ser: 1.05 mg/dL — ABNORMAL HIGH (ref 0.57–1.00)
Glucose: 217 mg/dL — ABNORMAL HIGH (ref 70–99)
Potassium: 4.2 mmol/L (ref 3.5–5.2)
Sodium: 140 mmol/L (ref 134–144)
eGFR: 57 mL/min/{1.73_m2} — ABNORMAL LOW (ref >59)

## 2021-02-27 LAB — TSH: TSH: 0.486 u[IU]/mL (ref 0.450–4.500)

## 2021-02-27 NOTE — Progress Notes (Signed)
Internal Medicine Clinic Attending ° °I saw and evaluated the patient.  I personally confirmed the key portions of the history and exam documented by Dr. Zinoviev and I reviewed pertinent patient test results.  The assessment, diagnosis, and plan were formulated together and I agree with the documentation in the resident’s note.  °

## 2021-03-05 ENCOUNTER — Other Ambulatory Visit: Payer: Self-pay

## 2021-03-05 DIAGNOSIS — F32A Depression, unspecified: Secondary | ICD-10-CM

## 2021-03-05 MED ORDER — VENLAFAXINE HCL ER 150 MG PO CP24: 150.0000 mg | ORAL_CAPSULE | Freq: Every day | ORAL | 3 refills | Status: DC

## 2021-04-12 ENCOUNTER — Other Ambulatory Visit: Payer: Self-pay

## 2021-04-12 MED ORDER — ROSUVASTATIN CALCIUM 20 MG PO TABS: 20.0000 mg | ORAL_TABLET | Freq: Every day | ORAL | 3 refills | Status: DC

## 2021-04-30 ENCOUNTER — Other Ambulatory Visit: Payer: Self-pay

## 2021-04-30 DIAGNOSIS — E1142 Type 2 diabetes mellitus with diabetic polyneuropathy: Secondary | ICD-10-CM

## 2021-04-30 MED ORDER — EMPAGLIFLOZIN 25 MG PO TABS: 25.0000 mg | ORAL_TABLET | Freq: Every day | ORAL | 1 refills | Status: DC

## 2021-05-22 ENCOUNTER — Ambulatory Visit (INDEPENDENT_AMBULATORY_CARE_PROVIDER_SITE_OTHER): Payer: 59 | Admitting: Internal Medicine

## 2021-05-22 ENCOUNTER — Ambulatory Visit: Payer: 59 | Admitting: Dietician

## 2021-05-22 VITALS — BP 126/62 | HR 90 | Temp 98.4°F | Ht 63.0 in | Wt 120.1 lb

## 2021-05-22 DIAGNOSIS — E785 Hyperlipidemia, unspecified: Secondary | ICD-10-CM

## 2021-05-22 DIAGNOSIS — Z794 Long term (current) use of insulin: Secondary | ICD-10-CM

## 2021-05-22 DIAGNOSIS — E1142 Type 2 diabetes mellitus with diabetic polyneuropathy: Secondary | ICD-10-CM | POA: Diagnosis not present

## 2021-05-22 DIAGNOSIS — I10 Essential (primary) hypertension: Secondary | ICD-10-CM

## 2021-05-22 DIAGNOSIS — R4189 Other symptoms and signs involving cognitive functions and awareness: Secondary | ICD-10-CM | POA: Diagnosis not present

## 2021-05-22 DIAGNOSIS — I129 Hypertensive chronic kidney disease with stage 1 through stage 4 chronic kidney disease, or unspecified chronic kidney disease: Secondary | ICD-10-CM

## 2021-05-22 DIAGNOSIS — N183 Chronic kidney disease, stage 3 unspecified: Secondary | ICD-10-CM

## 2021-05-22 DIAGNOSIS — E039 Hypothyroidism, unspecified: Secondary | ICD-10-CM

## 2021-05-22 DIAGNOSIS — E1122 Type 2 diabetes mellitus with diabetic chronic kidney disease: Secondary | ICD-10-CM | POA: Diagnosis not present

## 2021-05-22 DIAGNOSIS — E559 Vitamin D deficiency, unspecified: Secondary | ICD-10-CM

## 2021-05-22 DIAGNOSIS — G5601 Carpal tunnel syndrome, right upper limb: Secondary | ICD-10-CM

## 2021-05-22 DIAGNOSIS — G629 Polyneuropathy, unspecified: Secondary | ICD-10-CM

## 2021-05-22 DIAGNOSIS — F32A Depression, unspecified: Secondary | ICD-10-CM

## 2021-05-22 LAB — POCT GLYCOSYLATED HEMOGLOBIN (HGB A1C): HbA1c POC (<> result, manual entry): >14 % — AB (ref 4.0–5.6)

## 2021-05-22 LAB — GLUCOSE, CAPILLARY: Glucose-Capillary: 329 mg/dL — ABNORMAL HIGH (ref 70–99)

## 2021-05-22 MED ORDER — INSULIN DEGLUDEC 100 UNIT/ML ~~LOC~~ SOPN: 10.0000 [IU] | PEN_INJECTOR | Freq: Every day | SUBCUTANEOUS | 1 refills | Status: DC

## 2021-05-22 NOTE — Assessment & Plan Note (Addendum)
Patient reports she is doing well. PHQ-9 score is 6.  ? ?Plan ?- Continue Effexor XR '150mg'$  daily ?- Continue buspirone '5mg'$  twice daily ?

## 2021-05-22 NOTE — Patient Instructions (Signed)
-  It was a pleasure seeing you today ?-Your diabetes is uncontrolled.  We will start you on Tresiba 10 units daily (long-acting insulin) in addition to your other medications. ?-We have put in continuous glucose monitor and we will follow-up with you in 1 week to see how your blood sugars are doing ?-Your blood pressure is well controlled.  Keep up the great work! ?-I have put in a referral to home health to help with your medication management ?-I will also put in paperwork to help get your personal care services at home.  Our office will work on getting this set up for you ?-Your cognitive assessment shows some amount of cognitive impairment.  I will put an referral to neurology for further evaluation for you ?-Please call me if there are any questions or concerns or if you need any refills on your medications. ?

## 2021-05-22 NOTE — Patient Instructions (Signed)
Please record the time, amount and what food drinks and activities you have while wearing the continuous glucose monitor (CGM). ? ?Bring the folder with you to follow up appointments. If your monitor falls off, please place it in the bag provided in your folder and bring it back with you to your next appointment.  ? ?Do not have a CT or an MRI while wearing the CGM.  ? ?1 week visit has been set up with me and a doctor for the first of two CGM downloads.  ? ?You will also return in 2 weeks to have your second download and the CGM removed. ? ?Debera Lat, [place] ?[phone] ? ?

## 2021-05-22 NOTE — Progress Notes (Signed)
Documentation for Freestyle Libre Pro Continuous glucose monitoring ?Freestyle Libre Pro CGM sensor placed today. Patient was educated about wearing sensor, keeping food, activity and medication log and when to call office. Patient was educated about how to care for the sensor and not to have an MRI, CT or Diathermy while wearing the sensor. Follow up was arranged with the patient for 1 week.  ? ?Lot #: [id number] ?Serial #: [id number] ?Expiration Date: 08/28/2018 ? ?Debera Lat, RD ?05/22/2021 ?11:53 AM. ? ?

## 2021-05-22 NOTE — Assessment & Plan Note (Signed)
Patient has R hand weakness. She says she had surgery for carpal tunnel in the past. On exam, significantly decreased grip strength of R hand compared to L. Patient admits this causes difficulty any time she attempts to write, and she has to hold pans and other object using her L hand because of the weakness. ?

## 2021-05-22 NOTE — Progress Notes (Signed)
? ?This is a Careers information officer Note.  The care of the patient was discussed with Dr. Aldine Contes, MD and the assessment and plan was formulated with their assistance.  Please see their note for official documentation of the patient encounter.  ? ?Subjective:  ? ?Patient ID: [name] female   DOB: 1950/06/12 71 y.o.   MRN: [id number] ? ?HPI: ?[name] is a 71 y.o. with PMHx of TIIDM, hypothyroidism, depression, HTN, hyperlipidemia who presents for diabetes follow-up and to discuss family concerns about cognitive impairment. Please see problem-based charting for full assessment and plan. ? ?Past Medical History:  ?Diagnosis Date  ? Arthritis   ? Bilateral hip pain 02/24/2015  ? Borderline glaucoma of both eyes   ? Depression   ? Diabetic polyneuropathy ([place])   ? Diverticulosis of colon   ? Dizziness 12/05/2017  ? Dry eyes, bilateral   ? Feeling of incomplete bladder emptying   ? Frequent falls 01/08/2018  ? History of breast cancer oncologist-  dr Waymon Budge-- per lov note no recurrence  ? dx 04/ 1999 --- Stage 3B-- s/p  chemotherapy then right mastectomy then concurrent chemoradiation therapy  ? History of urinary retention   ? 01/ 2018  ? Hydronephrosis of right kidney   ? Hyperlipidemia   ? Hypertension   ? Hypokalemia 12/09/2016  ? Hypomagnesemia 12/09/2016  ? Hypothyroidism   ? Insulin dependent type 2 diabetes mellitus ([place])   ? endocrinologist-  dr Cruzita Lederer---  last A1c 8.7 on 02-20-2016  ? Left leg pain 03/15/2019  ? Lymphedema of upper extremity   ? right  ? Macular degeneration, left eye   ? followed by dr Zigmund Daniel  ? Renal insufficiency   ? Seizure-like activity ([place]) 04/17/2019  ? Urgency of urination   ? Visual hallucinations 01/12/2019  ? ?Current Outpatient Medications  ?Medication Sig Dispense Refill  ? insulin degludec (TRESIBA) 100 UNIT/ML FlexTouch Pen Inject 10 Units into the skin at bedtime. 15 mL 1  ? Accu-Chek Softclix Lancets lancets Check blood sugar 5 times a day as instructed 200  each 10  ? ALPHAGAN P 0.1 % SOLN Place 1 drop into both eyes 3 (three) times daily.  2  ? Besifloxacin HCl 0.6 % SUSP Besivance 0.6 % eye drops,suspension ? INSTILL 1 DROP INTO THE LEFT EYE QID X 2 DAYS AFTER EACH MONTHLY EYE INJECTION    ? Blood Glucose Monitoring Suppl (ACCU-CHEK GUIDE ME) w/Device KIT Check blood sugar 5 times a day 1 kit 1  ? busPIRone (BUSPAR) 5 MG tablet Take 1 tablet (5 mg total) by mouth 2 (two) times daily. 180 tablet 3  ? calcium citrate-vitamin D (CITRACAL+D) 315-200 MG-UNIT tablet Take 1 tablet by mouth 2 (two) times daily. (Patient not taking: Reported on 11/20/2017) 100 tablet 3  ? Cholecalciferol (VITAMIN D3) 2000 units TABS Take 2,000 Units at bedtime by mouth.     ? cycloSPORINE (RESTASIS) 0.05 % ophthalmic emulsion Restasis MultiDose 0.05 % eye drops ? INT 1 GTT IN EACH EYE BID    ? empagliflozin (JARDIANCE) 25 MG TABS tablet Take 1 tablet (25 mg total) by mouth daily. 90 tablet 1  ? glucose 4 GM chewable tablet Chew 4 tablets (16 g total) by mouth as needed for low blood sugar. 50 tablet 12  ? glucose blood (ACCU-CHEK GUIDE) test strip Check blood sugar 5 times per day 150 each 11  ? glucose blood test strip OneTouch Verio test strips ? USE TO CHECK BS BID    ?  Insulin Pen Needle (BD PEN NEEDLE NANO U/F) 32G X 4 MM MISC USE AS DIRECTED 100 each 6  ? ketorolac (ACULAR) 0.5 % ophthalmic solution Place 1 drop into both eyes 3 (three) times daily.  12  ? levothyroxine (SYNTHROID) 50 MCG tablet Take 1 tablet (50 mcg total) by mouth daily before breakfast. 90 tablet 3  ? metFORMIN (GLUCOPHAGE-XR) 500 MG 24 hr tablet Take 2 tablets (1,000 mg total) by mouth daily with breakfast. 90 tablet 3  ? Nutritional Supplements (GLUCERNA SNACK SHAKE) LIQD Take 1 Dose by mouth 3 (three) times daily as needed. 30 Bottle 3  ? Olopatadine HCl 0.2 % SOLN Place 1 drop into both eyes daily as needed (dry eyes).     ? Polyethyl Glycol-Propyl Glycol (SYSTANE) 0.4-0.3 % GEL ophthalmic gel Place 1 application  daily as needed into both eyes (dry eyes/irritation).     ? prednisoLONE acetate (PRED FORTE) 1 % ophthalmic suspension     ? rosuvastatin (CRESTOR) 20 MG tablet Take 1 tablet (20 mg total) by mouth daily. 90 tablet 3  ? venlafaxine XR (EFFEXOR-XR) 150 MG 24 hr capsule Take 1 capsule (150 mg total) by mouth daily with breakfast. 90 capsule 3  ? ?No current facility-administered medications for this visit.  ? ?Family History  ?Problem Relation Age of Onset  ? Heart disease Father   ? Diabetes Father   ? Stroke Sister   ? Anuerysm Sister 12  ?     brain; maternal half-sister  ? Heart disease Brother   ? Anuerysm Brother 76  ?     aortic; maternal half-brother  ? Breast cancer Daughter 64  ?     negative genetic testing in 2015  ? Heart attack Daughter   ?     46-47  ? Diabetes Paternal Grandmother   ? Stroke Paternal Grandfather   ? Anuerysm Brother   ?     NOS type; full brother  ? Fibroids Daughter   ?     s/p hysterectomy at 38y  ? Brain cancer Maternal Uncle   ?     dx. older than 3; NOS type  ? Brain cancer Cousin   ?     maternal 1st cousin; d. early 50s; NOS type  ? Deafness Paternal Uncle   ?     prelingual  ? ?Social History  ? ?Socioeconomic History  ? Marital status: Widowed  ?  Spouse name: Not on file  ? Number of children: Not on file  ? Years of education: Not on file  ? Highest education level: Not on file  ?Occupational History  ? Not on file  ?Tobacco Use  ? Smoking status: Never  ? Smokeless tobacco: Never  ?Vaping Use  ? Vaping Use: Never used  ?Substance and Sexual Activity  ? Alcohol use: No  ?  Alcohol/week: 0.0 standard drinks  ? Drug use: No  ? Sexual activity: Not on file  ?Other Topics Concern  ? Not on file  ?Social History Narrative  ? Not on file  ? ?Social Determinants of Health  ? ?Financial Resource Strain: Not on file  ?Food Insecurity: Not on file  ?Transportation Needs: Not on file  ?Physical Activity: Not on file  ?Stress: Not on file  ?Social Connections: Not on file  ? ?Review  of Systems: ?Pertinent items are noted in HPI. ?Objective:  ?Physical Exam: ?Vitals:  ? 05/22/21 1044  ?BP: 126/62  ?Pulse: 90  ?Temp: 98.4 ?F (36.9 ?C)  ?  TempSrc: Oral  ?SpO2: 100%  ?Weight: 120 lb 1.6 oz (54.5 kg)  ?Height: _0  (1.6 m)  ? ?BP 126/62 (Patient Position: Sitting, Cuff Size: Small)   Pulse 90   Temp 98.4 ?F (36.9 ?C) (Oral)   Ht _1  (1.6 m)   Wt 120 lb 1.6 oz (54.5 kg)   SpO2 100%   BMI 21.27 kg/m?  ? ?General Appearance:    Alert, cooperative, no distress  ?Head:    Normocephalic, atraumatic  ?Lungs:     Clear to auscultation bilaterally, respirations unlabored  ? Heart:    Regular rate and rhythm, S1 and S2 normal, no murmur  ?Abdomen:     Soft, non-tender, bowel sounds active all four quadrants,  ?  no masses, no organomegaly  ?Neurologic:   Decreased grip strength of R hand compared to L  ? ?Assessment & Plan:  ? ?See encounters tab for problem-based charting.  ? ?Cognitive impairment ?Patient's son and family members have concerns about cognitive impairment. He says her memory seemed really poor in late January and February. He says she was increasingly confused about where her home was and was forgetting she was at home. Patient moved back into her home in late 2022 to live independently, and son she had been living with previously has been driving from [facility] to make sure she has her medications and what she needs. Her son said his siblings live close by, but they are not dependable. ? ?Since living alone, her diabetes has become very uncontrolled (A1c > 14). She walks using a cane for assistance and has not had any falls since prior to her last appointment. Her son and their family members are requesting home health services. Patient had home health previously, but the case was closed because the home was unsafe. On exam, patient is alert and cooperative, and she answered all questions appropriately. On MoCA Assessment, patient scored 12/30, indicating severe cognitive  impairment. At last visit, patient was living with son, and there was no discussion about her living independently, but there is significant concern about her safety living home alone. Will continue to monitor and s

## 2021-05-22 NOTE — Assessment & Plan Note (Signed)
Patient blood pressure well-controlled today 126/62. She is not on any antihypertensives. No changes at this time. ?

## 2021-05-22 NOTE — Assessment & Plan Note (Addendum)
Patient's son and family members have concerns about cognitive impairment. He says her memory seemed really poor in late January and February. He says she was increasingly confused about where her home was and was believing she needed to up to leave when she was at home. Patient moved back into her home in late 2022 to live independently, and son she had been living with previously has been driving from [facility] to make sure she has her medications and what she needs. Her son said his siblings live close by, but they are not dependable. ? ?Since living alone, her diabetes has become very uncontrolled (A1c > 14). She walks using a cane for assistance and has not had any falls since prior to her last appointment. Her son and their family members are requesting home health services. Patient had home health previously, but the case was closed because the home was unsafe. On exam, patient is alert and cooperative, and she answered all questions appropriately. When asked about her memory directly, she said it comes and goes. On MoCA Assessment, patient scored 12/30, indicating severe cognitive impairment. At last visit, patient was living with son, and there was no discussion about her living independently, but there is significant concern about her safety now that she is living home alone. Will continue to monitor and seek resources.  ? ?Plan ?- Referral to neurology ?- Referral to home health services ?

## 2021-05-22 NOTE — Assessment & Plan Note (Signed)
Patient has chronic hyperlipidemia. She takes Crestor '20mg'$  daily. No changes at this time. ?

## 2021-05-22 NOTE — Assessment & Plan Note (Addendum)
Patient diabetes very uncontrolled. POC A1c > 14 today. Last A1c on 1/30 was 9.3. Patient has not been checking sugars since moving months ago, at which time she lost her meter. Patient's son says she eats breakfast and dinner daily and eats several snacks for lunch. He says his siblings help make sure she has her meals. When patient was living with son, sugars were under control, but when she returned to living alone, sugars became uncontrolled. She denies any numbness, tingling, but she reports she is urinating very frequently. She has not had an eye exam in over a year. ? ?Current regimen includes Jardiance and Metformin. Patient is tolerating well without side effects. Ozempic was discontinued due to weight loss, but since last visit, she has gained 17lbs from 103lbs to 120lbs today. Discussed care with [name], diabetes coordinator, and she placed Freestyle Libre Pro CGM sensor today. Patient was educated about wearing sensor, keeping food, activity and medication log, and when to call office. Follow up was arranged with the patient for 1 week. ?? ?Plan ?- Follow-up appointment in 1 week to discuss Freestyle Libre Pro CGM sensor results ?- Start insulin degludec 10 units at bedtime ?- Continue Metformin 1,'000mg'$  daily with breakfast ?- Continue Jardiance '25mg'$  daily ?- Encouraged better dietary control ?- Order home glucose meter in the future ?- Ophthalmology exam ?- Repeat A1c in 3 months ?

## 2021-05-23 ENCOUNTER — Telehealth: Payer: Self-pay | Admitting: *Deleted

## 2021-05-23 NOTE — Assessment & Plan Note (Signed)
-   This problem is chronic and stable ?- Will recheck VItamin D level at her next visit ?

## 2021-05-23 NOTE — Progress Notes (Signed)
Attestation for Student Documentation: ? ?I personally was present and performed or re-performed the history, physical exam and medical decision-making activities of this service and have verified that the service and findings are accurately documented in the student?s note. ? ?Aldine Contes, MD ?05/23/2021, 12:25 PM  ?

## 2021-05-23 NOTE — Assessment & Plan Note (Signed)
-   This problem is chronic and stable ?- Will recheck BMP at next visit ?

## 2021-05-23 NOTE — Assessment & Plan Note (Addendum)
>>  ASSESSMENT AND PLAN FOR CARPAL TUNNEL SYNDROME OF RIGHT WRIST WRITTEN ON 05/23/2021 12:22 PM BY Christy Bark, MD  - Patient with persistent tingling and numbness in her hands and difficulty using her right hand despite carpal tunnel surgery - She endorses needing to use her left hand to hold things including a pen to write -She has difficulty with daily activities and is unable to cook a simple meal and needs assistance with getting dressed as well as ambulating - She is currently ambulating with a cane - Suspect her uncontrolled diabetes is likely worsening her neuropathy - Will continue to monitor - Referral made for PCS in addition to HHA, RN help for medication management  >>ASSESSMENT AND PLAN FOR CARPAL TUNNEL SYNDROME WRITTEN ON 05/22/2021  2:02 PM BY [name], MEDICAL STUDENT  Patient has R hand weakness. She says she had surgery for carpal tunnel in the past. On exam, significantly decreased grip strength of R hand compared to L. Patient admits this causes difficulty any time she attempts to write, and she has to hold pans and other object using her L hand because of the weakness.

## 2021-05-23 NOTE — Telephone Encounter (Signed)
PCS forms faxed to [facility]. [place] [phone] / [phone]. Conformation time 16:56. Spoke with son  [name] to let him know that the PCS forms for his mother has been faxed. Phone number given to him so he can call and follow up on appointment for in assessment, that is if he has not heard from office in about 1  week. ?

## 2021-05-29 ENCOUNTER — Encounter: Payer: Self-pay | Admitting: Dietician

## 2021-05-29 ENCOUNTER — Ambulatory Visit (INDEPENDENT_AMBULATORY_CARE_PROVIDER_SITE_OTHER): Payer: 59 | Admitting: Internal Medicine

## 2021-05-29 ENCOUNTER — Ambulatory Visit (INDEPENDENT_AMBULATORY_CARE_PROVIDER_SITE_OTHER): Payer: 59 | Admitting: Dietician

## 2021-05-29 VITALS — BP 148/79 | HR 96 | Temp 99.7°F | Ht 63.0 in | Wt 124.9 lb

## 2021-05-29 DIAGNOSIS — Z7189 Other specified counseling: Secondary | ICD-10-CM | POA: Insufficient documentation

## 2021-05-29 DIAGNOSIS — E1142 Type 2 diabetes mellitus with diabetic polyneuropathy: Secondary | ICD-10-CM | POA: Diagnosis not present

## 2021-05-29 DIAGNOSIS — R4189 Other symptoms and signs involving cognitive functions and awareness: Secondary | ICD-10-CM | POA: Diagnosis not present

## 2021-05-29 DIAGNOSIS — Z794 Long term (current) use of insulin: Secondary | ICD-10-CM | POA: Diagnosis not present

## 2021-05-29 DIAGNOSIS — I10 Essential (primary) hypertension: Secondary | ICD-10-CM

## 2021-05-29 HISTORY — DX: Other specified counseling: Z71.89

## 2021-05-29 MED ORDER — INSULIN DEGLUDEC 100 UNIT/ML ~~LOC~~ SOPN: 17.0000 [IU] | PEN_INJECTOR | Freq: Every day | SUBCUTANEOUS | 1 refills | Status: DC

## 2021-05-29 NOTE — Assessment & Plan Note (Addendum)
-   BP was mildly elevated today with SBP in the 140s.  ?- She was well controlled at her visit last week and has been off al her anti hypertensives ?- We will continue to monitor her BP off meds for now ?

## 2021-05-29 NOTE — Assessment & Plan Note (Signed)
-   Patient has a living will in our system ?- I went over her selections again since it was done in 2018.  ?- Patient and sister agree with prior selections including having her son [name] make decisions for her in case she is unable to as well as not prolonging life in terminal situations or in advanced dementia ?- She also does not want tube feeds in those situations ?

## 2021-05-29 NOTE — Progress Notes (Signed)
? ?  Subjective:  ? ? Patient ID: [name], female    DOB: 12-Jul-1950, 71 y.o.   MRN: [id number] ? ?HPI ? ?I have seen and examined this patient. She is here for follow up of her diabetes from last week.  ? ?Patient states that she has been compliant with her insulin that was started last week. She denies any complaints today and states that she feels well. She is accompanied by her sister to this appointment. ? ?Review of Systems  ?Constitutional: Negative.   ?HENT: Negative.    ?Respiratory: Negative.    ?Cardiovascular: Negative.   ?Gastrointestinal: Negative.   ?Musculoskeletal: Negative.   ?Psychiatric/Behavioral: Negative.    ? ?   ?Objective:  ? Physical Exam ?Constitutional:   ?   Appearance: Normal appearance.  ?HENT:  ?   Head: Normocephalic and atraumatic.  ?Cardiovascular:  ?   Rate and Rhythm: Normal rate and regular rhythm.  ?   Heart sounds: Normal heart sounds.  ?Pulmonary:  ?   Effort: Pulmonary effort is normal.  ?   Breath sounds: Normal breath sounds. No wheezing or rales.  ?Abdominal:  ?   General: Bowel sounds are normal. There is no distension.  ?   Palpations: Abdomen is soft.  ?   Tenderness: There is no abdominal tenderness.  ?Musculoskeletal:     ?   General: No swelling or tenderness.  ?Neurological:  ?   Mental Status: She is alert and oriented to person, place, and time.  ?Psychiatric:     ?   Mood and Affect: Mood normal.     ?   Behavior: Behavior normal.  ? ? ? ? ? ?   ?Assessment & Plan:  ? ?Please see problem based charting for assessment and plan: ?

## 2021-05-29 NOTE — Patient Instructions (Signed)
Please try to drink Glucerna in between meals as a "snack" and drink water with  meals  ? ?This should help keep blood sugars more stable/level over the day and  flatten out her blood sugar curves. ? ?See you next week to rmove the sensors and get another report. ?

## 2021-05-29 NOTE — Patient Instructions (Addendum)
It was very nice to see you in clinic today. Here are a few reminders from what we discussed: ? ?We will increase the insulin to 17units each night because your blood sugars are still very elevated. ?We will continue the glucose monitoring for another week. ?Please check your blood sugars at home using the meter. ?Please make sure your son calls [name] so we can help get you set up with home health services. ?As you speak to the agency, please let them know all the areas you need help with including meal preparation, getting dressed and so on ? ?We will have you follow-up in 1 week. If you have any questions or concerns before then, please do not hesitate to let us know. ? ?

## 2021-05-29 NOTE — Progress Notes (Signed)
?Diabetes Self-Management Education ? ?Visit Type: Annual Follow-Up ? ?Appt. Start Time: 945 Appt. End Time: 3154 ? ?05/29/2021 ? ?[name], identified by name and date of birth, is a 71 y.o. female with a diagnosis of Diabetes:  [name] Type 2 ? ?ASSESSMENT ? ?[name] her daughter gives insulin 10 units about bedtime. 9-10 PM. Little activity, also takes jardiance and metformin ?Estimated body mass index is 22.13 kg/m? as calculated from the following: ?  Height as of an earlier encounter on 05/29/21: '5\' 3"'$  (1.6 m). ?  Weight as of an earlier encounter on 05/29/21: 124 lb 14.4 oz (56.7 kg). ? ?Wt Readings from Last 10 Encounters:  ?05/29/21 124 lb 14.4 oz (56.7 kg)  ?05/22/21 120 lb 1.6 oz (54.5 kg)  ?02/26/21 111 lb 14.4 oz (50.8 kg)  ?12/07/20 103 lb 12.8 oz (47.1 kg)  ?05/23/20 108 lb 4.8 oz (49.1 kg)  ?10/19/19 112 lb 11.2 oz (51.1 kg)  ?08/17/19 108 lb 11.2 oz (49.3 kg)  ?07/21/19 109 lb 6.4 oz (49.6 kg)  ?06/30/19 113 lb 9.6 oz (51.5 kg)  ?06/01/19 122 lb 4.8 oz (55.5 kg)  ? ?Lab Results  ?Component Value Date  ? HGBA1C >14.0 (A) 05/22/2021  ? HGBA1C 9.3 (A) 02/26/2021  ? HGBA1C 8.4 (A) 12/07/2020  ? HGBA1C 8.5 (A) 05/23/2020  ? HGBA1C 7.9 (A) 10/19/2019  ?  ? ? ? Diabetes Self-Management Education - 05/29/21 1000   ? ?  ? Visit Information  ? Visit Type Annual Follow-Up   ?  ? Health Coping  ? How would you rate your overall health? Good   ?  ? Psychosocial Assessment  ? Patient Belief/Attitude about Diabetes Motivated to manage diabetes   ? What is the hardest part about your diabetes right now, causing you the most concern, or is the most worrisome to you about your diabetes?   Checking blood sugar   ? Self-care barriers Debilitated state due to current medical condition;Unsteady gait/risk for falls   here with sister in law, daughter and brother assist also, they are asking for home health nurse and aid  ? Self-management support Family   ? Other persons present Family Member   ? Patient Concerns Glycemic  Control   ? Special Needs Instruct caregiver   ? Preferred Learning Style No preference indicated   ? Learning Readiness Ready   ? How often do you need to have someone help you when you read instructions, pamphlets, or other written materials from your doctor or pharmacy? 4 - Often   ?  ? Pre-Education Assessment  ? Patient understands incorporating nutritional management into lifestyle. Needs Review   ? Patient understands monitoring blood glucose, interpreting and using results Needs Review   her family is interested in understanding the CGM reports and where her blood sugar is  ?  ? Complications  ? Last HgB A1C per patient/outside source 14 %   ? How often do you check your blood sugar? 0 times/day (not testing)   ? Have you had a dilated eye exam in the past 12 months? No   sister in law to make appointment  ? Have you had a dental exam in the past 12 months? No   ? Are you checking your feet? No   ?  ? Dietary Intake  ? Breakfast 9am-12pm Oatmeal and yogurt and glucerna   ? Lunch they are unsure what she eats as her brother feeds her lunch   ? Dinner 7-9 PM- chicken lo  meain 1/4 of servings from a restaurant and glucerna and yogurt   ? Beverage(s) water   ?  ? Activity / Exercise  ? Activity / Exercise Type ADL's   ?  ? Patient Education  ? Previous Diabetes Education Yes (please comment)   yes here, but she is not remembering things now  ? Healthy Eating Meal options for control of blood glucose level and chronic complications.   ? Monitoring Taught/evaluated CGM (comment)   professional download 1 week today explained to family member and patient  ?  ? Individualized Goals (developed by patient)  ? Nutrition Follow meal plan discussed   ? Monitoring  Test my blood glucose as discussed   ?  ? Outcomes  ? Expected Outcomes Demonstrated interest in learning but significant barriers to change   ? Future DMSE Other (comment)   1 week  ? Program Status Completed   ?  ? Subsequent Visit  ? Since your last visit  have you continued or begun to take your medications as prescribed? Yes   family is giving it to her and confirms amounts  ? Since your last visit have you had your blood pressure checked? Yes   ? Is your most recent blood pressure lower, unchanged, or higher since your last visit? Unchanged   ? Since your last visit have you experienced any weight changes? Gain   this is approrpaite weight gain as she was underweight  ? Weight Gain (lbs) 4   ? Since your last visit, are you checking your blood glucose at least once a day? No   they did not bring a meter today  ? ?  ?  ? ?  ? ? ?Individualized Plan for Diabetes Self-Management Training:  ? ?Learning Objective:  Patient will have a greater understanding of diabetes self-management. ?Patient education plan is to attend individual and/or group sessions per assessed needs and concerns. ?  ?Plan:  ? ?Patient Instructions  ?Please try to drink Glucerna in between meals as a "snack" and drink water with  meals  ? ?This should help keep blood sugars more stable/level over the day and  flatten out her blood sugar curves. ? ?See you next week to rmove the sensors and get another report. ? ?Expected Outcomes:  Demonstrated interest in learning but significant barriers to change ? ?Education material provided: Diabetes Resources ? ?If problems or questions, patient to contact team via:  Phone ? ?Future DSME appointment: Other (comment) (1 week) ?Debera Lat, RD ?05/29/2021 ?11:03 AM. ? ?

## 2021-05-29 NOTE — Assessment & Plan Note (Signed)
-   Patient with significant cognitive impairment and her Verdi was 12/30 when done at her last visit ?- Referral to neurology was made. Will follow up ?- Son will speak to [facility] to help set up PCS for patient given she has trouble with ADLs ?

## 2021-05-29 NOTE — Assessment & Plan Note (Signed)
-   This problem is chronic and uncontrolled ?- Her blood sugars were elevated on the CGM placed last week with average blood sugar of 400 with none of her values in target ?- We will increase her tresiba to 17 units and continue with jardiance and metformin at current doses ?- Glucose meter given to her today by [name] ?- Patient was educated about doing smaller, more frequent meals. States she is compliant with her diet but occasionally eats ice cream ?- Will follow up with diabetes educator [name]) and in clinic next week ?

## 2021-06-05 ENCOUNTER — Telehealth: Payer: Self-pay | Admitting: *Deleted

## 2021-06-05 NOTE — Telephone Encounter (Signed)
Patient has appointment with nurse assessment 06-06-2021 3:30pm Son will call with the name of the agency providing services and amount of monthly hours. ?

## 2021-06-07 ENCOUNTER — Ambulatory Visit (INDEPENDENT_AMBULATORY_CARE_PROVIDER_SITE_OTHER): Payer: 59 | Admitting: Internal Medicine

## 2021-06-07 ENCOUNTER — Encounter: Payer: Self-pay | Admitting: Dietician

## 2021-06-07 ENCOUNTER — Ambulatory Visit (INDEPENDENT_AMBULATORY_CARE_PROVIDER_SITE_OTHER): Payer: 59 | Admitting: Dietician

## 2021-06-07 DIAGNOSIS — E1142 Type 2 diabetes mellitus with diabetic polyneuropathy: Secondary | ICD-10-CM

## 2021-06-07 DIAGNOSIS — Z794 Long term (current) use of insulin: Secondary | ICD-10-CM

## 2021-06-07 DIAGNOSIS — Z0000 Encounter for general adult medical examination without abnormal findings: Secondary | ICD-10-CM

## 2021-06-07 MED ORDER — INSULIN DEGLUDEC 100 UNIT/ML ~~LOC~~ SOPN: 24.0000 [IU] | PEN_INJECTOR | Freq: Every day | SUBCUTANEOUS | 1 refills | Status: DC

## 2021-06-07 NOTE — Patient Instructions (Addendum)
Fall Prevention in the Home, Adult ?Falls can cause injuries and can happen to people of all ages. There are many things you can do to make your home safe and to help prevent falls. Ask for help when making these changes. ?What actions can I take to prevent falls? ?General Instructions ?Use good lighting in all rooms. Replace any light bulbs that burn out. ?Turn on the lights in dark areas. Use night-lights. ?Keep items that you use often in easy-to-reach places. Lower the shelves around your home if needed. ?Set up your furniture so you have a clear path. Avoid moving your furniture around. ?Do not have throw rugs or other things on the floor that can make you trip. ?Avoid walking on wet floors. ?If any of your floors are uneven, fix them. ?Add color or contrast paint or tape to clearly mark and help you see: ?Grab bars or handrails. ?First and last steps of staircases. ?Where the edge of each step is. ?If you use a stepladder: ?Make sure that it is fully opened. Do not climb a closed stepladder. ?Make sure the sides of the stepladder are locked in place. ?Ask someone to hold the stepladder while you use it. ?Know where your pets are when moving through your home. ?What can I do in the bathroom? ? ?  ? ?Keep the floor dry. Clean up any water on the floor right away. ?Remove soap buildup in the tub or shower. ?Use nonskid mats or decals on the floor of the tub or shower. ?Attach bath mats securely with double-sided, nonslip rug tape. ?If you need to sit down in the shower, use a plastic, nonslip stool. ?Install grab bars by the toilet and in the tub and shower. Do not use towel bars as grab bars. ?What can I do in the bedroom? ?Make sure that you have a light by your bed that is easy to reach. ?Do not use any sheets or blankets for your bed that hang to the floor. ?Have a firm chair with side arms that you can use for support when you get dressed. ?What can I do in the kitchen? ?Clean up any spills right away. ?If  you need to reach something above you, use a step stool with a grab bar. ?Keep electrical cords out of the way. ?Do not use floor polish or wax that makes floors slippery. ?What can I do with my stairs? ?Do not leave any items on the stairs. ?Make sure that you have a light switch at the top and the bottom of the stairs. ?Make sure that there are handrails on both sides of the stairs. Fix handrails that are broken or loose. ?Install nonslip stair treads on all your stairs. ?Avoid having throw rugs at the top or bottom of the stairs. ?Choose a carpet that does not hide the edge of the steps on the stairs. ?Check carpeting to make sure that it is firmly attached to the stairs. Fix carpet that is loose or worn. ?What can I do on the outside of my home? ?Use bright outdoor lighting. ?Fix the edges of walkways and driveways and fix any cracks. ?Remove anything that might make you trip as you walk through a door, such as a raised step or threshold. ?Trim any bushes or trees on paths to your home. ?Check to see if handrails are loose or broken and that both sides of all steps have handrails. ?Install guardrails along the edges of any raised decks and porches. ?Clear paths of anything  that can make you trip, such as tools or rocks. ?Have leaves, snow, or ice cleared regularly. ?Use sand or salt on paths during winter. ?Clean up any spills in your garage right away. This includes grease or oil spills. ?What other actions can I take? ?Wear shoes that: ?Have a low heel. Do not wear high heels. ?Have rubber bottoms. ?Feel good on your feet and fit well. ?Are closed at the toe. Do not wear open-toe sandals. ?Use tools that help you move around if needed. These include: ?Canes. ?Walkers. ?Scooters. ?Crutches. ?Review your medicines with your doctor. Some medicines can make you feel dizzy. This can increase your chance of falling. ?Ask your doctor what else you can do to help prevent falls. ?Where to find more information ?Centers  for Disease Control and Prevention, [employer]AD[employer]: http://www.wolf.info/ ?[employer] on Aging: http://kim-miller.com/ ?Contact a doctor if: ?You are afraid of falling at home. ?You feel weak, drowsy, or dizzy at home. ?You fall at home. ?Summary ?There are many simple things that you can do to make your home safe and to help prevent falls. ?Ways to make your home safe include removing things that can make you trip and installing grab bars in the bathroom. ?Ask for help when making these changes in your home. ?This information is not intended to replace advice given to you by your health care provider. Make sure you discuss any questions you have with your health care provider. ?Document Revised: 10/16/2020 Document Reviewed: 08/18/2019 ?Elsevier Patient Education ? [place]. ? ?Health Maintenance, Female ?Adopting a healthy lifestyle and getting preventive care are important in promoting health and wellness. Ask your health care provider about: ?The right schedule for you to have regular tests and exams. ?Things you can do on your own to prevent diseases and keep yourself healthy. ?What should I know about diet, weight, and exercise? ?Eat a healthy diet ? ?Eat a diet that includes plenty of vegetables, fruits, low-fat dairy products, and lean protein. ?Do not eat a lot of foods that are high in solid fats, added sugars, or sodium. ?Maintain a healthy weight ?Body mass index (BMI) is used to identify weight problems. It estimates body fat based on height and weight. Your health care provider can help determine your BMI and help you achieve or maintain a healthy weight. ?Get regular exercise ?Get regular exercise. This is one of the most important things you can do for your health. Most adults should: ?Exercise for at least 150 minutes each week. The exercise should increase your heart rate and make you sweat (moderate-intensity exercise). ?Do strengthening exercises at least twice a week. This is in addition to the  moderate-intensity exercise. ?Spend less time sitting. Even light physical activity can be beneficial. ?Watch cholesterol and blood lipids ?Have your blood tested for lipids and cholesterol at 71 years of age, then have this test every 5 years. ?Have your cholesterol levels checked more often if: ?Your lipid or cholesterol levels are high. ?You are older than 71 years of age. ?You are at high risk for heart disease. ?What should I know about cancer screening? ?Depending on your health history and family history, you may need to have cancer screening at various ages. This may include screening for: ?Breast cancer. ?Cervical cancer. ?Colorectal cancer. ?Skin cancer. ?Lung cancer. ?What should I know about heart disease, diabetes, and high blood pressure? ?Blood pressure and heart disease ?High blood pressure causes heart disease and increases the risk of stroke. This is more likely to  develop in people who have high blood pressure readings or are overweight. ?Have your blood pressure checked: ?Every 3-5 years if you are 43-68 years of age. ?Every year if you are 32 years old or older. ?Diabetes ?Have regular diabetes screenings. This checks your fasting blood sugar level. Have the screening done: ?Once every three years after age 21 if you are at a normal weight and have a low risk for diabetes. ?More often and at a younger age if you are overweight or have a high risk for diabetes. ?What should I know about preventing infection? ?Hepatitis B ?If you have a higher risk for hepatitis B, you should be screened for this virus. Talk with your health care provider to find out if you are at risk for hepatitis B infection. ?Hepatitis C ?Testing is recommended for: ?Everyone born from [birth year] through 1965. ?Anyone with known risk factors for hepatitis C. ?Sexually transmitted infections (STIs) ?Get screened for STIs, including gonorrhea and chlamydia, if: ?You are sexually active and are younger than 71 years of age. ?You are  older than 71 years of age and your health care provider tells you that you are at risk for this type of infection. ?Your sexual activity has changed since you were last screened, and you are at increased risk

## 2021-06-07 NOTE — Progress Notes (Signed)
[facility]' Continuous glucose monitoring sensor was removed today and downloaded. A report was provided to patient and her family as well as Dr. Darrick Meigs. Ms. Cloe' family is interested in restarting her on a Continuous glucose monitoring. They were asked to see if they can find her Dexcom receiver because Medicare may not pay fro another receiver until after 5 years. ?Debera Lat, RD ?06/07/2021 ?2:48 PM. ? ?

## 2021-06-07 NOTE — Progress Notes (Deleted)
Subjective:   [name] is a 71 y.o. female who presents for Medicare Annual (Subsequent) preventive examination.  Review of Systems    Defer to pcp       Objective:    There were no vitals filed for this visit. There is no height or weight on file to calculate BMI.     06/07/2021    2:44 PM 05/29/2021   11:14 AM 05/22/2021    1:39 PM 02/26/2021    2:59 PM 06/16/2020   11:25 AM 05/23/2020   11:15 AM 10/19/2019    9:34 AM  Advanced Directives  Does Patient Have a Medical Advance Directive? Yes Yes Yes No Yes Yes Yes  Type of Advance Directive Living will;Healthcare Power of Attorney Living will;Healthcare Power of Attorney Living will;Healthcare Power of [employer] Power of [place];Living will Healthcare Power of [place];Living will Healthcare Power of [place];Living will  Does patient want to make changes to medical advance directive? No - Patient declined No - Patient declined No - Patient declined   No - Patient declined No - Patient declined  Copy of Healthcare Power of Attorney in Chart? Yes - validated most recent copy scanned in chart (See row information) Yes - validated most recent copy scanned in chart (See row information) Yes - validated most recent copy scanned in chart (See row information)  Yes - validated most recent copy scanned in chart (See row information) Yes - validated most recent copy scanned in chart (See row information) No - copy requested  Would patient like information on creating a medical advance directive?    No - Patient declined       Current Medications (verified) Outpatient Encounter Medications as of 06/07/2021  Medication Sig   Accu-Chek Softclix Lancets lancets Check blood sugar 5 times a day as instructed   ALPHAGAN P 0.1 % SOLN Place 1 drop into both eyes 3 (three) times daily.   Besifloxacin HCl 0.6 % SUSP Besivance 0.6 % eye drops,suspension  INSTILL 1 DROP INTO THE LEFT EYE QID X 2 DAYS AFTER EACH MONTHLY EYE INJECTION    Blood Glucose Monitoring Suppl (ACCU-CHEK GUIDE ME) w/Device KIT Check blood sugar 5 times a day   busPIRone (BUSPAR) 5 MG tablet Take 1 tablet (5 mg total) by mouth 2 (two) times daily.   calcium citrate-vitamin D (CITRACAL+D) 315-200 MG-UNIT tablet Take 1 tablet by mouth 2 (two) times daily. (Patient not taking: Reported on 11/20/2017)   Cholecalciferol (VITAMIN D3) 2000 units TABS Take 2,000 Units at bedtime by mouth.    cycloSPORINE (RESTASIS) 0.05 % ophthalmic emulsion Restasis MultiDose 0.05 % eye drops  INT 1 GTT IN EACH EYE BID   empagliflozin (JARDIANCE) 25 MG TABS tablet Take 1 tablet (25 mg total) by mouth daily.   glucose 4 GM chewable tablet Chew 4 tablets (16 g total) by mouth as needed for low blood sugar.   glucose blood (ACCU-CHEK GUIDE) test strip Check blood sugar 5 times per day   glucose blood test strip OneTouch Verio test strips  USE TO CHECK BS BID   Insulin Pen Needle (BD PEN NEEDLE NANO U/F) 32G X 4 MM MISC USE AS DIRECTED   ketorolac (ACULAR) 0.5 % ophthalmic solution Place 1 drop into both eyes 3 (three) times daily.   levothyroxine (SYNTHROID) 50 MCG tablet Take 1 tablet (50 mcg total) by mouth daily before breakfast.   metFORMIN (GLUCOPHAGE-XR) 500 MG 24 hr tablet Take 2 tablets (1,000 mg total) by mouth daily  with breakfast.   Nutritional Supplements (GLUCERNA SNACK SHAKE) LIQD Take 1 Dose by mouth 3 (three) times daily as needed.   Olopatadine HCl 0.2 % SOLN Place 1 drop into both eyes daily as needed (dry eyes).    Polyethyl Glycol-Propyl Glycol (SYSTANE) 0.4-0.3 % GEL ophthalmic gel Place 1 application daily as needed into both eyes (dry eyes/irritation).    prednisoLONE acetate (PRED FORTE) 1 % ophthalmic suspension    rosuvastatin (CRESTOR) 20 MG tablet Take 1 tablet (20 mg total) by mouth daily.   venlafaxine XR (EFFEXOR-XR) 150 MG 24 hr capsule Take 1 capsule (150 mg total) by mouth daily with breakfast.   [DISCONTINUED] insulin degludec (TRESIBA) 100  UNIT/ML FlexTouch Pen Inject 17 Units into the skin at bedtime.   No facility-administered encounter medications on file as of 06/07/2021.    Allergies (verified) Gabapentin, Meclizine hcl, Metformin hcl er, Penicillins, and Sulfa antibiotics   History: Past Medical History:  Diagnosis Date   Arthritis    Bilateral hip pain 02/24/2015   Borderline glaucoma of both eyes    Depression    Diabetic polyneuropathy (HCC)    Diverticulosis of colon    Dizziness 12/05/2017   Dry eyes, bilateral    Feeling of incomplete bladder emptying    Frequent falls 01/08/2018   History of breast cancer oncologist-  dr Marlena Clipper-- per lov note no recurrence   dx 04/ 1999 --- Stage 3B-- s/p  chemotherapy then right mastectomy then concurrent chemoradiation therapy   History of urinary retention    01/ 2018   Hydronephrosis of right kidney    Hyperlipidemia    Hypertension    Hypokalemia 12/09/2016   Hypomagnesemia 12/09/2016   Hypothyroidism    Insulin dependent type 2 diabetes mellitus [facility])    endocrinologist-  dr Elvera Lennox---  last A1c 8.7 on 02-20-2016   Left leg pain 03/15/2019   Lymphedema of upper extremity    right   Macular degeneration, left eye    followed by dr Ashley Royalty   Renal insufficiency    Seizure-like activity (HCC) 04/17/2019   Urgency of urination    Visual hallucinations 01/12/2019   Past Surgical History:  Procedure Laterality Date   CARPAL TUNNEL RELEASE Right 2017   and tendon repair   CATARACT EXTRACTION W/ INTRAOCULAR LENS  IMPLANT, BILATERAL  2017   CYSTO/  RIGHT RETROGRADE PYELOGRAM/  UNROOFING RIGHT URETEROCELE  09/18/2000   CYSTOSCOPY/RETROGRADE/URETEROSCOPY Right 05/09/2016   Procedure: CYSTOSCOPY and right RETROGRADE;  Surgeon: Bjorn Pippin, MD;  [facility];  Service: Urology;  Laterality: Right;   FINGER SURGERY Left x2 prior to 09-09-2014   I & D EXTREMITY Left 09/09/2014   Procedure: IRRIGATION AND DEBRIDEMENT LEFT THUMB DISTAL  PHALANX;  Surgeon: Cindee Salt, MD;  Location: [facility];  Service: Orthopedics;  Laterality: Left;   MASTECTOMY Right 1999   w/  Node dissection's   PORT-A-CATH REMOVAL  05/01/1999   TUBAL LIGATION     VAGINAL HYSTERECTOMY  1970's   WRIST GANGLION EXCISION Right 2016   Family History  Problem Relation Age of Onset   Heart disease Father    Diabetes Father    Stroke Sister    Anuerysm Sister 26       brain; maternal half-sister   Heart disease Brother    Anuerysm Brother 66       aortic; maternal half-brother   Breast cancer Daughter 37       negative genetic testing in 2015  Heart attack Daughter        25-47   Diabetes Paternal Grandmother    Stroke Paternal Grandfather    Anuerysm Brother        NOS type; full brother   Fibroids Daughter        s/p hysterectomy at 39y   Brain cancer Maternal Uncle        dx. older than 9; NOS type   Brain cancer Cousin        maternal 1st cousin; d. early 49s; NOS type   Deafness Paternal Uncle        prelingual   Social History   Socioeconomic History   Marital status: Widowed    Spouse name: Not on file   Number of children: Not on file   Years of education: Not on file   Highest education level: Not on file  Occupational History   Not on file  Tobacco Use   Smoking status: Never   Smokeless tobacco: Never  Vaping Use   Vaping Use: Never used  Substance and Sexual Activity   Alcohol use: No    Alcohol/week: 0.0 standard drinks   Drug use: No   Sexual activity: Not on file  Other Topics Concern   Not on file  Social History Narrative   Not on file   Social Determinants of Health   Financial Resource Strain: Low Risk    Difficulty of Paying Living Expenses: Not hard at all  Food Insecurity: No Food Insecurity   Worried About Programme researcher, broadcasting/film/video in the Last Year: Never true   Ran Out of Food in the Last Year: Never true  Transportation Needs: No Transportation Needs   Lack of Transportation  (Medical): No   Lack of Transportation (Non-Medical): No  Physical Activity: Inactive   Days of Exercise per Week: 0 days   Minutes of Exercise per Session: 0 min  Stress: No Stress Concern Present   Feeling of Stress : Not at all  Social Connections: Unknown   Frequency of Communication with Friends and Family: More than three times a week   Frequency of Social Gatherings with Friends and Family: More than three times a week   Attends Religious Services: Never   Database administrator or Organizations: No   Attends Engineer, structural: Never   Marital Status: Not on file    Tobacco Counseling Counseling given: Not Answered   Clinical Intake:  Pre-visit preparation completed: Yes  Pain : No/denies pain        How often do you need to have someone help you when you read instructions, pamphlets, or other written materials from your doctor or pharmacy?: 4 - Often What is the last grade level you completed in school?: GED  Diabetic?yes Nutrition Risk Assessment:  Has the patient had any N/V/D within the last 2 months?  No  Does the patient have any non-healing wounds?  No  Has the patient had any unintentional weight loss or weight gain?  Yes   Diabetes:  Is the patient diabetic?  Yes  If diabetic, was a CBG obtained today?  No  Did the patient bring in their glucometer from home?  No  How often do you monitor your CBG's? .   Financial Strains and Diabetes Management:  Are you having any financial strains with the device, your supplies or your medication? No .  Does the patient want to be seen by Chronic Care Management for management of their diabetes?  No  Would the patient like to be referred to a Nutritionist or for Diabetic Management?   Already followed by Nutritionist  Diabetic Exams:  Diabetic Eye Exam: Overdue for diabetic eye exam. Pt has been advised about the importance in completing this exam. Patient advised to call and schedule an eye  exam. Diabetic Foot Exam: Completed 12/07/2020   Interpreter Needed?: No  Information entered by :: [email or web address]   Activities of Daily Living    06/07/2021    2:46 PM 05/22/2021    1:41 PM  In your present state of health, do you have any difficulty performing the following activities:  Hearing? 0 0  Vision? 0 0  Difficulty concentrating or making decisions? 1 1  Walking or climbing stairs? 1 1  Dressing or bathing? 1 0  Doing errands, shopping? 1 1    Patient Care Team: Earl Lagos, MD as PCP - General (Internal Medicine) Sherrie George, MD as Attending Physician (Ophthalmology) Helane Gunther, DPM as Consulting Physician (Podiatry) Bjorn Pippin, MD as Attending Physician (Urology) Jodelle Gross, RN as Registered Nurse  Indicate any recent Medical Services you may have received from other than [facility] providers in the past year (date may be approximate).     Assessment:   This is a routine wellness examination for [name].  Hearing/Vision screen No results found.  Dietary issues and exercise activities discussed:     Goals Addressed   None   Depression Screen    06/07/2021    2:45 PM 05/29/2021   11:14 AM 02/26/2021    3:59 PM 12/07/2020    4:29 PM 06/16/2020   11:28 AM 05/23/2020   12:11 PM 10/19/2019   10:32 AM  PHQ 2/9 Scores  PHQ - 2 Score 1 1 1  0 0 3 0  PHQ- 9 Score 5 4 4 1  0 6 5    Fall Risk    06/07/2021    2:43 PM 05/22/2021    1:40 PM 02/26/2021    2:58 PM 06/16/2020   11:25 AM 05/23/2020   12:12 PM  Fall Risk   Falls in the past year? 1 0 0 0 0  Comment multiple falls, but no injury      Number falls in past yr: 1 0 0  0  Injury with Fall? 0 0 0  0  Risk for fall due to : History of fall(s);Impaired balance/gait;Impaired mobility  No Fall Risks History of fall(s);Impaired balance/gait   Follow up Falls evaluation completed Falls evaluation completed Falls evaluation completed;Falls prevention discussed Falls prevention discussed      FALL RISK PREVENTION PERTAINING TO THE HOME:  Any stairs in or around the home? Yes -steps leading into home If so, are there any without handrails? No  Home free of loose throw rugs in walkways, pet beds, electrical cords, etc? Yes  Adequate lighting in your home to reduce risk of falls? Yes   ASSISTIVE DEVICES UTILIZED TO PREVENT FALLS:  Life alert? Yes  Use of a cane, walker or w/c? No  Grab bars in the bathroom? Yes  Shower chair or bench in shower? No  Elevated toilet seat or a handicapped toilet? No   TIMED UP AND GO:  Was the test performed? No .  Length of time to ambulate 10 feet: 0 sec.   Gait unsteady with use of assistive device, provider informed and education provided.   Cognitive Function:        06/07/2021    2:56 PM  6CIT Screen  What Year? 4 points  What month? 3 points  What time? 0 points  Count back from 20 0 points  Months in reverse 2 points  Repeat phrase 0 points  Total Score 9 points   Immunizations Immunization History  Administered Date(s) Administered   Fluad Quad(high Dose 65+) 12/07/2020   Influenza Split 11/12/2011   Influenza Whole 12/30/2005   Influenza, High Dose Seasonal PF 11/02/2016   Influenza,inj,Quad PF,6+ Mos 11/10/2012, 10/21/2013, 10/18/2014, 10/03/2015, 10/14/2017, 10/13/2018, 10/19/2019   Influenza,inj,Quad PF,6-35 Mos 11/02/2016   Influenza,inj,quad, With Preservative 12/19/2016   Moderna Sars-Covid-2 Vaccination 06/19/2019, 07/17/2019   Pneumococcal Conjugate-13 10/18/2015   Pneumococcal Polysaccharide-23 02/12/2005, 08/13/2012, 10/21/2017   Td 09/29/2003   Tdap 12/09/2013    TDAP status: Up to date  Flu Vaccine status: Up to date  Pneumococcal vaccine status: Up to date  Covid-19 vaccine status: Completed vaccines  Qualifies for Shingles Vaccine? No   Zostavax completed No   Shingrix Completed?: No.    Education has been provided regarding the importance of this vaccine. Patient has been advised to  call insurance company to determine out of pocket expense if they have not yet received this vaccine. Advised may also receive vaccine at local pharmacy or Health Dept. Verbalized acceptance and understanding.  Screening Tests Health Maintenance  Topic Date Due   Zoster Vaccines- Shingrix (1 of 2) Never done   OPHTHALMOLOGY EXAM  02/06/2019   COVID-19 Vaccine (3 - Moderna risk series) 08/14/2019   LIPID PANEL  05/23/2021   URINE MICROALBUMIN  05/23/2021   HEMOGLOBIN A1C  08/21/2021   INFLUENZA VACCINE  08/28/2021   FOOT EXAM  12/07/2021   COLONOSCOPY (Pts 45-32yrs Insurance coverage will need to be confirmed)  02/05/2022   MAMMOGRAM  02/27/2022   TETANUS/TDAP  12/10/2023   Pneumonia Vaccine 46+ Years old  Completed   DEXA SCAN  Completed   Hepatitis C Screening  Completed   HPV VACCINES  Aged Out    Health Maintenance  Health Maintenance Due  Topic Date Due   Zoster Vaccines- Shingrix (1 of 2) Never done   OPHTHALMOLOGY EXAM  02/06/2019   COVID-19 Vaccine (3 - Moderna risk series) 08/14/2019   LIPID PANEL  05/23/2021   URINE MICROALBUMIN  05/23/2021    Colorectal cancer screening: Type of screening: Colonoscopy. Completed  . Repeat every unknown years  Mammogram status: Completed 01/312022. Repeat every year-will discuss need with pcp  Bone Density status: Completed 06/06/2016. Results reflect: Bone density results: NORMAL. Repeat every due now years.  Lung Cancer Screening: (Low Dose CT Chest recommended if Age 18-80 years, 30 pack-year currently smoking OR have quit w/in 15years.) does not qualify.   Lung Cancer Screening Referral: n/a  Additional Screening:  Hepatitis C Screening: does not qualify; Completed 10/18/2014  Vision Screening: Recommended annual ophthalmology exams for early detection of glaucoma and other disorders of the eye. Is the patient up to date with their annual eye exam?  No  Who is the provider or what is the name of the office in which the  patient attends annual eye exams? unknown If pt is not established with a provider, would they like to be referred to a provider to establish care?  N/a .   Dental Screening: Recommended annual dental exams for proper oral hygiene  Community Resource Referral / Chronic Care Management: CRR required this visit?  No   CCM required this visit?  No      Plan:     I have personally  reviewed and noted the following in the patient's chart:   Medical and social history Use of alcohol, tobacco or illicit drugs  Current medications and supplements including opioid prescriptions.  Functional ability and status Nutritional status Physical activity Advanced directives List of other physicians Hospitalizations, surgeries, and ER visits in previous 12 months Vitals Screenings to include cognitive, depression, and falls Referrals and appointments  In addition, I have reviewed and discussed with patient certain preventive protocols, quality metrics, and best practice recommendations. A written personalized care plan for preventive services as well as general preventive health recommendations were provided to patient.     Joslyn Devon, [place]   06/07/2021   Nurse Notes: face to face 25 minutes   [name] , Thank you for taking time to come for your Medicare Wellness Visit. I appreciate your ongoing commitment to your health goals. Please review the following plan we discussed and let me know if I can assist you in the future.   These are the goals we discussed:  Goals       Blood Pressure < 140/90      BP Readings from Last 3 Encounters:  05/23/20 138/71  10/19/19 128/77  08/17/19 (!) 89/55   **RNCM attempting to get patient a home BP monitor  Not meeting targets for SBP      HEMOGLOBIN A1C < 7.0      Lab Results  Component Value Date   HGBA1C 8.5 (A) 05/23/2020     Not meeting Hgb A1C target      HEMOGLOBIN A1C < 7.0      LDL CALC < 100      Lab Results   Component Value Date   CHOL 105 05/23/2020   HDL 47 05/23/2020   LDLCALC 43 05/23/2020   TRIG 69 05/23/2020   CHOLHDL 2.2 05/23/2020    Not meeting LDL and total cholesterol target      Set My Target A1C-Diabetes Type 2- " My son Ines Bloomer checks my blood sugar and makes sure I eat the right kind of foods." (pt-stated)      Timeframe:  Long-Range Goal Priority:  High Start Date:       05/24/19                      Expected End Date: ongoing                   Follow Up Date    - set target A1C    Why is this important?   Your target A1C is decided together by you and your doctor.  It is based on several things like your age and other health issues.    Notes:         This is a list of the screening recommended for you and due dates:  Health Maintenance  Topic Date Due   Zoster (Shingles) Vaccine (1 of 2) Never done   Eye exam for diabetics  02/06/2019   COVID-19 Vaccine (3 - Moderna risk series) 08/14/2019   Lipid (cholesterol) test  05/23/2021   Urine Protein Check  05/23/2021   Hemoglobin A1C  08/21/2021   Flu Shot  08/28/2021   Complete foot exam   12/07/2021   Colon Cancer Screening  02/05/2022   Mammogram  02/27/2022   Tetanus Vaccine  12/10/2023   Pneumonia Vaccine  Completed   DEXA scan (bone density measurement)  Completed   Hepatitis C Screening: USPSTF Recommendation to screen -  Ages 30-79 yo.  Completed   HPV Vaccine  Aged Out

## 2021-06-07 NOTE — Patient Instructions (Signed)
Increase the tresiba to 24U. Check your blood sugars daily and follow up in 1-2 weeks.  ?

## 2021-06-08 NOTE — Assessment & Plan Note (Addendum)
She presents for CGM Download #2. [name] was increased from 10 to 17U at her LOV.   ?Of additional note, she is on metformin XR 1000 mg daily, Jardiance 25 mg daily ? ?CGM report from today was reviewed.   ? ?CGM Download #2: data summary  ? Average is    363 for 15 days  ? Time sensor is active   100%  ? Time in range (70-180 mg/dL):  6% (Goal >70%)  ? Time High (181-250 mg/dL)  9% (Goal < 25%)  ? Time Very High (>250 mg/dl)  85% (Goal < 5%)  ? Time Low (54-69 mg/dL)  0% (Goal is <4%)  ? Time Very Low (<54)  0%  (Goal <1%)  ? ?Trend: Glucoses remain above detectable limits for a large proportion of the day. The only time she will occasionally fall within the target range is between 3AM - 9AM. ?The patient is currently using Continuous Glucose Monitoring. The patient is injecting insulin 1 times a day. The patient is making adjustments to their diabetes regimen based on glucose readings.  ? ?Plan ?- Increase Tresiba to 24 units daily.  I would consider this a reasonable increase in light of the minimal improvement of her glucoses with a 7 unit increased last week in the absence of hypoglycemia ?-Continue metformin and Jardiance. ?- CGM removed at today's visit.  She supposedly has a Dexcom however her family is unable to locate it at this time.  Does not qualify for another Dexcom-see Donna's note.  They will continue to monitor her glucose with regular fingersticks in the interim. ?- She will need to follow-up in another week for reevaluation.  If glucoses remain significantly elevated, I would consider increasing her metformin XR. ? ?

## 2021-06-08 NOTE — Progress Notes (Signed)
? ?  Office Visit ? ? Patient ID: [name], female    DOB: Sep 04, 1950, 71 y.o.   MRN: [id number]   PCP: Aldine Contes, MD  ? ?Subjective:  ?CC: Diabetes (Follow up/)  ? ?[name] is femalea 71 y.o. year old female who presents for the above medical condition(s).  Family is present at today's visit.  Please refer to problem based charting for assessment and plan. ? ? ? ?Objective:  ? ?BP (!) 154/82 (BP Location: Left Arm, Patient Position: Sitting, Cuff Size: Normal)   Pulse 95   Temp 99.4 ?F (37.4 ?C) (Oral)   Wt 127 lb 9.6 oz (57.9 kg)   SpO2 100%   BMI 22.60 kg/m?  ? ?General: Chronically ill-appearing elderly female sitting in a wheelchair ?Cardiac: Heart regular rate and rhythm ?Pulm: Lungs are clear ?Assessment & Plan:  ? ?Problem List Items Addressed This Visit   ? ?  ? Endocrine  ? Type 2 diabetes mellitus with diabetic polyneuropathy, with long-term current use of insulin (HCC) (Chronic)  ?  She presents for CGM Download #2. Tyler Aas was increased from 10 to 17U at her LOV.   ?Of additional note, she is on metformin XR 1000 mg daily, Jardiance 25 mg daily ? ?CGM report from today was reviewed.   ? ?CGM Download #2: data summary  ? Average is    363 for 15 days  ? Time sensor is active   100%  ? Time in range (70-180 mg/dL):  6% (Goal >70%)  ? Time High (181-250 mg/dL)  9% (Goal < 25%)  ? Time Very High (>250 mg/dl)  85% (Goal < 5%)  ? Time Low (54-69 mg/dL)  0% (Goal is <4%)  ? Time Very Low (<54)  0%  (Goal <1%)  ? ?Trend: Glucoses remain above detectable limits for a large proportion of the day. The only time she will occasionally fall within the target range is between 3AM - 9AM. ?The patient is currently using Continuous Glucose Monitoring. The patient is injecting insulin 1 times a day. The patient is making adjustments to their diabetes regimen based on glucose readings.  ? ?Plan ?- Increase Tresiba to 24 units daily.  I would consider this a reasonable increase in light of the minimal  improvement of her glucoses with a 7 unit increased last week in the absence of hypoglycemia ?-Continue metformin and Jardiance. ?- CGM removed at today's visit.  She supposedly has a Dexcom however her family is unable to locate it at this time.  Does not qualify for another Dexcom-see Donna's note.  They will continue to monitor her glucose with regular fingersticks in the interim. ?- She will need to follow-up in another week for reevaluation.  If glucoses remain significantly elevated, I would consider increasing her metformin XR. ? ?  ?  ? Relevant Medications  ? insulin degludec (TRESIBA) 100 UNIT/ML FlexTouch Pen  ? ? ? ?Return in about 1 week (around 06/14/2021). ?-Glucose log check.  Consider increasing metformin XR dose in addition to [place] if needed for ongoing hyperglycemia ?- Follow-up on family's ability to locate Dexcom ? ? ?Pt discussed with Dr. Evette Doffing ? ?Mitzi Hansen, MD ?Internal Medicine Resident PGY-3 ?[facility] ?06/09/2021 9:09 PM  ?  ?

## 2021-06-09 ENCOUNTER — Encounter: Payer: Self-pay | Admitting: Internal Medicine

## 2021-06-11 NOTE — Addendum Note (Signed)
Addended by: Lalla Brothers T on: 06/11/2021 08:33 AM ? ? Modules accepted: Level of Service ? ?

## 2021-06-11 NOTE — Progress Notes (Signed)
Internal Medicine Clinic Attending  Case discussed with Dr. Christian  At the time of the visit.  We reviewed the resident's history and exam and pertinent patient test results.  I agree with the assessment, diagnosis, and plan of care documented in the resident's note.  

## 2021-06-18 ENCOUNTER — Telehealth: Payer: Self-pay | Admitting: *Deleted

## 2021-06-18 NOTE — Telephone Encounter (Signed)
Returned call to [employer]), per son ,Ms. Decelle has been approved for 80 hours per month. They are trying to get her services with [facility].  [name] will call me back to let me know for sure about the agency.

## 2021-06-19 NOTE — Progress Notes (Signed)
AWV canceled.

## 2021-06-21 ENCOUNTER — Encounter: Payer: Self-pay | Admitting: Dietician

## 2021-06-21 ENCOUNTER — Ambulatory Visit (INDEPENDENT_AMBULATORY_CARE_PROVIDER_SITE_OTHER): Payer: 59 | Admitting: Dietician

## 2021-06-21 ENCOUNTER — Ambulatory Visit (INDEPENDENT_AMBULATORY_CARE_PROVIDER_SITE_OTHER): Payer: 59 | Admitting: Internal Medicine

## 2021-06-21 DIAGNOSIS — Z794 Long term (current) use of insulin: Secondary | ICD-10-CM

## 2021-06-21 DIAGNOSIS — I10 Essential (primary) hypertension: Secondary | ICD-10-CM | POA: Diagnosis not present

## 2021-06-21 DIAGNOSIS — E1142 Type 2 diabetes mellitus with diabetic polyneuropathy: Secondary | ICD-10-CM | POA: Diagnosis not present

## 2021-06-21 MED ORDER — DEXCOM G7 SENSOR MISC
3 refills | Status: DC
Start: 2021-06-21 — End: 2021-06-21

## 2021-06-21 MED ORDER — DEXCOM G7 SENSOR MISC: 3 refills | Status: DC

## 2021-06-21 NOTE — Assessment & Plan Note (Signed)
Blood pressure is above goal in the office today.  Recheck at next office visit and treat accordingly.

## 2021-06-21 NOTE — Progress Notes (Signed)
    Diabetes Self-Management Education  Visit Type:  Annual Follow-Up  Appt. Start Time: 1340 Appt. End Time: 8546  06/21/2021  [name], identified by name and date of birth, is a 71 y.o. female with a diagnosis of Diabetes:  .   ASSESSMENT  Estimated body mass index is 22.71 kg/m as calculated from the following:   Height as of 05/29/21: '5\' 3"'$  (1.6 m).   Weight as of an earlier encounter on 06/21/21: 128 lb 3.2 oz (58.2 kg). Wt Readings from Last 10 Encounters:  06/21/21 128 lb 3.2 oz (58.2 kg)  06/07/21 127 lb 9.6 oz (57.9 kg)  05/29/21 124 lb 14.4 oz (56.7 kg)  05/22/21 120 lb 1.6 oz (54.5 kg)  02/26/21 111 lb 14.4 oz (50.8 kg)  12/07/20 103 lb 12.8 oz (47.1 kg)  05/23/20 108 lb 4.8 oz (49.1 kg)  10/19/19 112 lb 11.2 oz (51.1 kg)  08/17/19 108 lb 11.2 oz (49.3 kg)  07/21/19 109 lb 6.4 oz (49.6 kg)   BP Readings from Last 3 Encounters:  06/21/21 (!) 175/82  06/07/21 (!) 154/82  05/29/21 (!) 148/79   Lab Results  Component Value Date   HGBA1C >14.0 (A) 05/22/2021   HGBA1C 9.3 (A) 02/26/2021   HGBA1C 8.4 (A) 12/07/2020   HGBA1C 8.5 (A) 05/23/2020   HGBA1C 7.9 (A) 10/19/2019      Dexcom G7 Personal CGM Training  [name] and her friend [name] were educated about the following:  -Getting to know device    (Phone programmed ) -Setting up device (high alert  250  , low alert 80  ) -Rise alert set at   not set   -Fall alert set at    not set  -Setting alert profile -Inserting sensor ( sample Dexcom G7 sensor was put on her left arm  WNL) -Calibrating- none required for G7 -Ending sensor session -Trouble shooting -Tape guide, clarity information was on    Patient has [facility] tech support and my contact information. Her son [name] is pending acceptance of her share invitations.  Her sensor had 16 minutes in warm up when she left the office. They are following up next week  Learning Objective:  Patient will have a greater understanding of diabetes  self-management. Patient education plan is to attend individual and/or group sessions per assessed needs and concerns.   Plan:   There are no Patient Instructions on file for this visit.   Expected Outcomes:  Demonstrated interest in learning. Expect positive outcomes  Education material provided: Diabetes Resources  If problems or questions, patient to contact team via:  Phone  Future DSME appointment: - 4-6 wks Debera Lat, RD 06/21/2021 2:58 PM.

## 2021-06-21 NOTE — Patient Instructions (Signed)
Please return to the office once you have worn the sensor for 7 days.

## 2021-06-21 NOTE — Progress Notes (Signed)
   Office Visit   Patient ID: [name], female    DOB: 1950/06/17, 71 y.o.   MRN: [id number]   PCP: Aldine Contes, MD   Subjective:  CC: Diabetes (2wk follow up (pt did not bring meter today-checked cbg x 2 since last visit) ) and Diarrhea   [name] is a 71 y.o. year old female who presents for the above medical condition(s). Please refer to problem based charting for assessment and plan.  Objective:   BP (!) 175/82 (BP Location: Left Arm, Patient Position: Sitting, Cuff Size: Normal)   Pulse (!) 101   Temp 98.7 F (37.1 C) (Oral)   Wt 128 lb 3.2 oz (58.2 kg)   SpO2 100%   BMI 22.71 kg/m   General: Chronically ill frail appearing female sitting in transport chair HEENT: Moist mucous membranes Cardiac: Regular rate and rhythm Pulm: Lungs clear Skin: Warm and dry Assessment & Plan:   Problem List Items Addressed This Visit       Cardiovascular and Mediastinum   HTN, goal below 140/90 (Chronic)    Blood pressure is above goal in the office today.  Recheck at next office visit and treat accordingly.         Endocrine   Type 2 diabetes mellitus with diabetic polyneuropathy, with long-term current use of insulin (HCC) (Chronic)    She returns today for meter download and medication management.  Unfortunately, her family has not been checking her blood sugars at home and her glucometer was not brought to this appointment today.  Her sister-in-law, who brought her today says that they checked her blood sugar once in the past week at which time it was 97.  Patient and daughter-in-law deny any hypoglycemia. Case reviewed with [name], RD, again today.  We will work on getting her back on a personal CGM which will hopefully help [place] in adjusting her insulin. - No medication changes made today - She met with [name] today.  I have sent in [place] sensors to her pharmacy.  She will be using her phone as the receiver. - She will follow-up in 1 week for medication management -  Of additional note, she has been experiencing postprandial diarrhea for the past couple of weeks.  I suspect that this is related to rapid changes in her serum glucose and should improve after stabilization.  She is having this episode happen 1-2 times a day and is remaining well-hydrated so I do not think labs are needed at this time       Follow-up in 1 week for personal CGM download and medication management - Reevaluate blood pressure and treat accordingly   Pt discussed with Dr. Adolm Joseph, MD Internal Medicine Resident PGY-3 [facility] 06/21/2021 5:33 PM

## 2021-06-21 NOTE — Assessment & Plan Note (Addendum)
She returns today for meter download and medication management.  Unfortunately, her family has not been checking her blood sugars at home and her glucometer was not brought to this appointment today.  Her sister-in-law, who brought her today says that they checked her blood sugar once in the past week at which time it was 97.  Patient and daughter-in-law deny any hypoglycemia. Case reviewed with [name], RD, again today.  We will work on getting her back on a personal CGM which will hopefully help [place] in adjusting her insulin. - No medication changes made today - She met with [name] today.  I have sent in [place] sensors to her pharmacy.  She will be using her phone as the receiver. - She will follow-up in 1 week for medication management - Of additional note, she has been experiencing postprandial diarrhea for the past couple of weeks.  I suspect that this is related to rapid changes in her serum glucose and should improve after stabilization.  She is having this episode happen 1-2 times a day and is remaining well-hydrated so I do not think labs are needed at this time

## 2021-06-27 NOTE — Progress Notes (Signed)
Internal Medicine Clinic Attending  I saw and evaluated the patient.  I personally confirmed the key portions of the history and exam documented by Dr. Christian   and I reviewed pertinent patient test results.  The assessment, diagnosis, and plan were formulated together and I agree with the documentation in the resident's note.  

## 2021-06-28 ENCOUNTER — Encounter: Payer: Self-pay | Admitting: Student

## 2021-06-28 ENCOUNTER — Ambulatory Visit (INDEPENDENT_AMBULATORY_CARE_PROVIDER_SITE_OTHER): Payer: 59 | Admitting: Student

## 2021-06-28 ENCOUNTER — Other Ambulatory Visit: Payer: Self-pay

## 2021-06-28 VITALS — BP 154/78 | HR 97 | Temp 98.7°F | Ht 62.0 in | Wt 130.0 lb

## 2021-06-28 DIAGNOSIS — I10 Essential (primary) hypertension: Secondary | ICD-10-CM

## 2021-06-28 DIAGNOSIS — E1142 Type 2 diabetes mellitus with diabetic polyneuropathy: Secondary | ICD-10-CM

## 2021-06-28 DIAGNOSIS — I129 Hypertensive chronic kidney disease with stage 1 through stage 4 chronic kidney disease, or unspecified chronic kidney disease: Secondary | ICD-10-CM | POA: Diagnosis not present

## 2021-06-28 DIAGNOSIS — N183 Chronic kidney disease, stage 3 unspecified: Secondary | ICD-10-CM

## 2021-06-28 DIAGNOSIS — Z794 Long term (current) use of insulin: Secondary | ICD-10-CM

## 2021-06-28 DIAGNOSIS — E1122 Type 2 diabetes mellitus with diabetic chronic kidney disease: Secondary | ICD-10-CM

## 2021-06-28 DIAGNOSIS — Z7984 Long term (current) use of oral hypoglycemic drugs: Secondary | ICD-10-CM

## 2021-06-28 MED ORDER — PIOGLITAZONE HCL 15 MG PO TABS: 15.0000 mg | ORAL_TABLET | Freq: Every day | ORAL | 3 refills | Status: DC

## 2021-06-28 MED ORDER — INSULIN DEGLUDEC 100 UNIT/ML ~~LOC~~ SOPN: 20.0000 [IU] | PEN_INJECTOR | Freq: Every day | SUBCUTANEOUS | 1 refills | Status: DC

## 2021-06-28 NOTE — Patient Instructions (Addendum)
For your TRESIBA your insulin injection, please reduce your dose to 20 units a day instead of 24units.   We will also add a medicine called pioglitazone to help with your blood sugars.   Please bring your blood sugar data with you at your next appointment.

## 2021-06-29 LAB — BMP8+ANION GAP
Anion Gap: 18 mmol/L (ref 10.0–18.0)
BUN/Creatinine Ratio: 27 (ref 12–28)
BUN: 29 mg/dL — ABNORMAL HIGH (ref 8–27)
CO2: 22 mmol/L (ref 20–29)
Calcium: 10 mg/dL (ref 8.7–10.3)
Chloride: 100 mmol/L (ref 96–106)
Creatinine, Ser: 1.09 mg/dL — ABNORMAL HIGH (ref 0.57–1.00)
Glucose: 139 mg/dL — ABNORMAL HIGH (ref 70–99)
Potassium: 4.4 mmol/L (ref 3.5–5.2)
Sodium: 140 mmol/L (ref 134–144)
eGFR: 55 mL/min/{1.73_m2} — ABNORMAL LOW (ref >59)

## 2021-06-29 LAB — MICROALBUMIN / CREATININE URINE RATIO
Creatinine, Urine: 30.4 mg/dL
Microalb/Creat Ratio: 182 mg/g creat — ABNORMAL HIGH (ref 0–29)
Microalbumin, Urine: 55.2 ug/mL

## 2021-06-29 NOTE — Assessment & Plan Note (Signed)
    Latest Ref Rng & Units 06/28/2021    2:32 PM 02/26/2021    4:26 PM 05/23/2020   12:14 PM  BMP  Glucose 70 - 99 mg/dL 139   217   180    BUN 8 - 27 mg/dL '29   21   12    '$ Creatinine 0.57 - 1.00 mg/dL 1.09   1.05   0.99    BUN/Creat Ratio 12 - '28 27   20   12    '$ Sodium 134 - 144 mmol/L 140   140   140    Potassium 3.5 - 5.2 mmol/L 4.4   4.2   3.4    Chloride 96 - 106 mmol/L 100   99   98    CO2 20 - 29 mmol/L '22   24   25    '$ Calcium 8.7 - 10.3 mg/dL 10.0   9.9   10.3     Patient's serum creatinine appears to be at baseline.  He has CKD stage II-3A.  We will continue to monitor.

## 2021-06-29 NOTE — Assessment & Plan Note (Signed)
Patient's blood pressure remains elevated and above goal.  She is on multiple medications and appears to have a difficult time managing them.  She relies heavily on her son and daughter for assistance with this. -We will consider addition of antihypertensive at next clinic visit when patient is more acclimated with her current medication regimen

## 2021-06-29 NOTE — Assessment & Plan Note (Addendum)
Patient has a [place] she unfortunately forgot her phone.  Our diabetes educator was thankfully able to contact her son who said that in the mornings her blood sugar start out 70 to 90s and will go to the low 200s after a meal and continue in the 200s until dinner.  However her blood sugars sometimes drop to 70 or less.  Patient is currently on metformin 1 g twice daily.  She was previously on semaglutide however this was discontinued in August 2022 given patient's significant weight loss.  She is also on Jardiance 25 mg daily.  Her [place] A[place] was increased to 24 units during her last clinic visit.  Given patient's low blood sugars at night per son's report we will decrease her [place] to 20 units daily.  We will continue her current oral hypoglycemic agents.  In addition we will start pioglitazone 15 mg daily. -Patient noted to have some albuminuria on labs, she is on Jardiance -Patient will have close follow-up with [place] to recheck her CGM data -Patient will likely benefit from mealtime insulin, unfortunately her family may not be able to assist with this.  However, she is in the process of having an aide at home being set up.

## 2021-06-29 NOTE — Progress Notes (Signed)
   CC: type 2 diabetes   HPI:  [name] is a 71 y.o. F with PMH per below who presents for follow up of her T2DM. Please see problem based charting under encounters tab for further details.    Past Medical History:  Diagnosis Date   Arthritis    Bilateral hip pain 02/24/2015   Borderline glaucoma of both eyes    Depression    Diabetic polyneuropathy ([place])    Diverticulosis of colon    Dizziness 12/05/2017   Dry eyes, bilateral    Feeling of incomplete bladder emptying    Frequent falls 01/08/2018   History of breast cancer oncologist-  dr Waymon Budge-- per lov note no recurrence   dx 04/ 1999 --- Stage 3B-- s/p  chemotherapy then right mastectomy then concurrent chemoradiation therapy   History of urinary retention    01/ 2018   Hydronephrosis of right kidney    Hyperlipidemia    Hypertension    Hypokalemia 12/09/2016   Hypomagnesemia 12/09/2016   Hypothyroidism    Insulin dependent type 2 diabetes mellitus [facility])    endocrinologist-  dr Cruzita Lederer---  last A1c 8.7 on 02-20-2016   Left leg pain 03/15/2019   Lymphedema of upper extremity    right   Macular degeneration, left eye    followed by dr Zigmund Daniel   Renal insufficiency    Seizure-like activity ([place]) 04/17/2019   Urgency of urination    Visual hallucinations 01/12/2019   Review of Systems:  Please see problem based charting under encounters tab for further details.    Physical Exam:  Vitals:   06/28/21 1327 06/28/21 1333  BP: (!) 163/76 (!) 154/78  Pulse: 100 97  Temp: 98.7 F (37.1 C)   TempSrc: Oral   SpO2: 100%   Weight: 130 lb (59 kg)   Height: '5\' 2"'$  (1.575 m)     Constitutional: Thin chronically ill appearing, and in no distress.  HENT:  Head: Normocephalic and atraumatic.  Eyes: EOM are normal.  Neck: Normal range of motion.  Cardiovascular: Normal rate, regular rhythm, intact distal pulses. No gallop and no friction rub.  No murmur heard. No lower extremity edema  Pulmonary: Non labored  breathing on room air, no wheezing or rales  Abdominal: Soft. Normal bowel sounds. Non distended and non tender Musculoskeletal: Normal range of motion.        General: No tenderness or edema.  Neurological: Alert and oriented to person, place, and time. Non focal  Skin: Skin is warm and dry.    Assessment & Plan:   See Encounters Tab for problem based charting.  Patient seen with Dr. Dareen Piano

## 2021-07-02 NOTE — Progress Notes (Signed)
Internal Medicine Clinic Attending   I saw and evaluated the patient.  I personally confirmed the key portions of the history and exam documented by Dr. Carter and I reviewed pertinent patient test results.  The assessment, diagnosis, and plan were formulated together and I agree with the documentation in the resident's note.  

## 2021-07-04 ENCOUNTER — Telehealth: Payer: Self-pay

## 2021-07-04 DIAGNOSIS — E039 Hypothyroidism, unspecified: Secondary | ICD-10-CM

## 2021-07-04 NOTE — Telephone Encounter (Signed)
levothyroxine (SYNTHROID) 50 MCG tablet, REFILL REQUEST @ [id number] - [place], [place] [address].

## 2021-07-05 ENCOUNTER — Other Ambulatory Visit: Payer: Self-pay | Admitting: Internal Medicine

## 2021-07-05 ENCOUNTER — Encounter: Payer: Self-pay | Admitting: Student

## 2021-07-05 ENCOUNTER — Ambulatory Visit (INDEPENDENT_AMBULATORY_CARE_PROVIDER_SITE_OTHER): Payer: 59 | Admitting: Student

## 2021-07-05 ENCOUNTER — Other Ambulatory Visit: Payer: Self-pay

## 2021-07-05 VITALS — BP 175/87 | HR 97 | Temp 99.0°F | Ht 62.0 in | Wt 131.2 lb

## 2021-07-05 DIAGNOSIS — Z794 Long term (current) use of insulin: Secondary | ICD-10-CM

## 2021-07-05 DIAGNOSIS — E1142 Type 2 diabetes mellitus with diabetic polyneuropathy: Secondary | ICD-10-CM | POA: Diagnosis not present

## 2021-07-05 DIAGNOSIS — Z853 Personal history of malignant neoplasm of breast: Secondary | ICD-10-CM | POA: Diagnosis not present

## 2021-07-05 DIAGNOSIS — E785 Hyperlipidemia, unspecified: Secondary | ICD-10-CM | POA: Diagnosis not present

## 2021-07-05 DIAGNOSIS — I10 Essential (primary) hypertension: Secondary | ICD-10-CM

## 2021-07-05 DIAGNOSIS — Z7984 Long term (current) use of oral hypoglycemic drugs: Secondary | ICD-10-CM

## 2021-07-05 DIAGNOSIS — E039 Hypothyroidism, unspecified: Secondary | ICD-10-CM

## 2021-07-05 MED ORDER — LEVOTHYROXINE SODIUM 50 MCG PO TABS: 50.0000 ug | ORAL_TABLET | Freq: Every day | ORAL | 3 refills | Status: DC

## 2021-07-05 MED ORDER — INSULIN DEGLUDEC 100 UNIT/ML ~~LOC~~ SOPN: 15.0000 [IU] | PEN_INJECTOR | Freq: Every day | SUBCUTANEOUS | 1 refills | Status: DC

## 2021-07-05 MED ORDER — OLMESARTAN MEDOXOMIL 20 MG PO TABS: 20.0000 mg | ORAL_TABLET | Freq: Every day | ORAL | 1 refills | Status: DC

## 2021-07-05 MED ORDER — PIOGLITAZONE HCL 15 MG PO TABS: 15.0000 mg | ORAL_TABLET | Freq: Every day | ORAL | 3 refills | Status: DC

## 2021-07-05 NOTE — Assessment & Plan Note (Addendum)
Today's Vitals   07/05/21 1339 07/05/21 1407  BP: (!) 171/82 (!) 175/87  Pulse: 98 97  Temp: 99 F (37.2 C)   TempSrc: Oral   SpO2: 100%   Weight: 131 lb 3.2 oz (59.5 kg)   Height: '5\' 2"'$  (1.575 m)   PainSc: 0-No pain    Body mass index is 24 kg/m.  Patient with history of HTN, currently not taking any medications. Patient has had elevated BP at several prior visits. Given significantly elevated BP today and again on repeat, will need to initiate antihypertensive therapy for primary prevention. Goal BP <140/90. Will start patient on olmesartan for concomitant nephroprotective effects given uncontrolled T2DM with proteinuria. Patient is due for BMP, but will wait until next visit after initiation of olmesartan.  Plan: -start olmesartan '25mg'$  daily -f/u in 2 weeks -BMP at next visit

## 2021-07-05 NOTE — Patient Instructions (Addendum)
[name],  It was a pleasure seeing you in the clinic today.   Please decrease your night time Insulin dose to 15 units. Please make sure to pick up Pioglitazone (for diabetes) and Olmesartan (for blood pressure) from your pharmacy. I have ordered your yearly mammogram so someone should call you to schedule this appointment. You can stop taking Vitamin D supplements at home. Please come back in 2 weeks for your next visit.  Please call our clinic at [phone] if you have any questions or concerns. The best time to call is Monday-Friday from 9am-4pm, but there is someone available 24/7 at the same number. If you need medication refills, please notify your pharmacy one week in advance and they will send [place] a request.   Thank you for letting us take part in your care. We look forward to seeing you next time!

## 2021-07-05 NOTE — Assessment & Plan Note (Signed)
Ordered annual mammogram for screening given history of breast cancer (in remission).

## 2021-07-05 NOTE — Assessment & Plan Note (Addendum)
Patient with history of uncontrolled T2DM, with last A1c of >14% on 05/22/2021. She is currently taking jardiance '25mg'$  daily, metformin XR '500mg'$  BID, and tresiba 20u qhs. She was prescribed pioglitazone during last office visit but this was not picked up and patient has not started this yet. She does have a DEXCOM in place and [name] was able to help with uploading these results for the past week (since last visit). Average blood glucose over the last week is 183. Glucose levels are at range 45% of time, but low 7% of the time and very low 1% of the time. The lows are concerning, especially since patient's son notes that lows typically occur in the early mornings between 2-5AM. Lows are manifested with jittery sensation and improve with glucose tablets. Given advanced age and multiple comorbidities, it is appropriate to have less stringent glycemic control for this patient and have goal A1c around 8%. At this time, will continue jardiance and metformin. Will decrease tresiba to 15u qhs in hopes of preventing early morning lows. Plan to have patient f/u in 2 weeks to recheck CGM and adjust medications accordingly. Patient is scheduled for diabetic eye exam on 08/15/2021.  Plan: -continue jardiance and metformin -decrease tresiba to 15u qhs -start pioglitazone -f/u in 2 weeks for CGM recheck -diabetic eye exam in July 2023

## 2021-07-05 NOTE — Assessment & Plan Note (Signed)
Patient with HLD, chronic and controlled with crestor '20mg'$  daily. She is due for lipid panel but will wait until next visit to perform with BMP (after initiation of olmesartan).  Plan: -continue crestor -lipid panel at next visit

## 2021-07-05 NOTE — Progress Notes (Signed)
   CC: f/u HTN, T2DM  HPI:  [name] is a 71 y.o. female with history listed below presenting to the [facility] for f/u HTN, T2DM. Please see individualized problem based charting for full HPI.  Past Medical History:  Diagnosis Date   Arthritis    Bilateral hip pain 02/24/2015   Borderline glaucoma of both eyes    Depression    Diabetic polyneuropathy ([place])    Diverticulosis of colon    Dizziness 12/05/2017   Dry eyes, bilateral    Feeling of incomplete bladder emptying    Frequent falls 01/08/2018   History of breast cancer oncologist-  dr Waymon Budge-- per lov note no recurrence   dx 04/ 1999 --- Stage 3B-- s/p  chemotherapy then right mastectomy then concurrent chemoradiation therapy   History of urinary retention    01/ 2018   Hydronephrosis of right kidney    Hyperlipidemia    Hypertension    Hypokalemia 12/09/2016   Hypomagnesemia 12/09/2016   Hypothyroidism    Insulin dependent type 2 diabetes mellitus [facility])    endocrinologist-  dr Cruzita Lederer---  last A1c 8.7 on 02-20-2016   Left leg pain 03/15/2019   LOW BACK PAIN 04/16/2006   Qualifier: Diagnosis of  By: Leward Quan MD, Pamala Hurry     Lymphedema of upper extremity    right   Macular degeneration, left eye    followed by dr Zigmund Daniel   Renal insufficiency    Seizure-like activity ([place]) 04/17/2019   Urgency of urination    Visual hallucinations 01/12/2019    Review of Systems:  Negative aside from that listed in individualized problem based charting.  Physical Exam:  Vitals:   07/05/21 1339 07/05/21 1407  BP: (!) 171/82 (!) 175/87  Pulse: 98 97  Temp: 99 F (37.2 C)   TempSrc: Oral   SpO2: 100%   Weight: 131 lb 3.2 oz (59.5 kg)   Height: '5\' 2"'$  (1.575 m)    Physical Exam Constitutional:      Appearance: Normal appearance. She is not ill-appearing.  HENT:     Mouth/Throat:     Mouth: Mucous membranes are moist.     Pharynx: Oropharynx is clear.  Eyes:     Extraocular Movements: Extraocular movements intact.      Conjunctiva/sclera: Conjunctivae normal.     Pupils: Pupils are equal, round, and reactive to light.  Cardiovascular:     Rate and Rhythm: Normal rate and regular rhythm.     Pulses: Normal pulses.     Heart sounds: Normal heart sounds. No murmur heard.    No gallop.  Pulmonary:     Effort: Pulmonary effort is normal.     Breath sounds: Normal breath sounds. No wheezing, rhonchi or rales.  Abdominal:     General: Bowel sounds are normal. There is no distension.     Palpations: Abdomen is soft.     Tenderness: There is no abdominal tenderness.  Musculoskeletal:        General: Normal range of motion.  Skin:    General: Skin is warm and dry.  Neurological:     Mental Status: She is alert and oriented to person, place, and time. Mental status is at baseline.  Psychiatric:        Mood and Affect: Mood normal.        Behavior: Behavior normal.      Assessment & Plan:   See Encounters Tab for problem based charting.  Patient discussed with Dr.  Cain Sieve

## 2021-07-06 ENCOUNTER — Other Ambulatory Visit: Payer: Self-pay | Admitting: Internal Medicine

## 2021-07-06 DIAGNOSIS — E039 Hypothyroidism, unspecified: Secondary | ICD-10-CM

## 2021-07-10 NOTE — Progress Notes (Signed)
Internal Medicine Clinic Attending  Case discussed with Dr. Jinwala  At the time of the visit.  We reviewed the resident's history and exam and pertinent patient test results.  I agree with the assessment, diagnosis, and plan of care documented in the resident's note.  

## 2021-07-19 ENCOUNTER — Encounter: Payer: Self-pay | Admitting: Student

## 2021-07-19 ENCOUNTER — Other Ambulatory Visit: Payer: Self-pay

## 2021-07-19 ENCOUNTER — Ambulatory Visit (INDEPENDENT_AMBULATORY_CARE_PROVIDER_SITE_OTHER): Payer: 59 | Admitting: Neurology

## 2021-07-19 ENCOUNTER — Ambulatory Visit (INDEPENDENT_AMBULATORY_CARE_PROVIDER_SITE_OTHER): Payer: 59 | Admitting: Student

## 2021-07-19 VITALS — BP 163/80 | HR 87 | Ht 62.0 in | Wt 135.0 lb

## 2021-07-19 VITALS — BP 162/77 | HR 90 | Temp 98.2°F | Wt 135.8 lb

## 2021-07-19 DIAGNOSIS — R269 Unspecified abnormalities of gait and mobility: Secondary | ICD-10-CM

## 2021-07-19 DIAGNOSIS — I10 Essential (primary) hypertension: Secondary | ICD-10-CM | POA: Diagnosis not present

## 2021-07-19 DIAGNOSIS — E785 Hyperlipidemia, unspecified: Secondary | ICD-10-CM

## 2021-07-19 DIAGNOSIS — E1142 Type 2 diabetes mellitus with diabetic polyneuropathy: Secondary | ICD-10-CM | POA: Diagnosis not present

## 2021-07-19 DIAGNOSIS — R4189 Other symptoms and signs involving cognitive functions and awareness: Secondary | ICD-10-CM

## 2021-07-19 DIAGNOSIS — Z7984 Long term (current) use of oral hypoglycemic drugs: Secondary | ICD-10-CM

## 2021-07-19 HISTORY — DX: Unspecified abnormalities of gait and mobility: R26.9

## 2021-07-19 MED ORDER — AMLODIPINE-OLMESARTAN 5-20 MG PO TABS: 1.0000 | ORAL_TABLET | Freq: Every day | ORAL | 2 refills | Status: DC

## 2021-07-19 MED ORDER — INSULIN DEGLUDEC 100 UNIT/ML ~~LOC~~ SOPN: 13.0000 [IU] | PEN_INJECTOR | Freq: Every day | SUBCUTANEOUS | 1 refills | Status: DC

## 2021-07-19 NOTE — Progress Notes (Signed)
Chief Complaint  Patient presents with   New Patient (Initial Visit)    RM 26, with son and sister  NP/Internal referral for cognitive impairment, unable to complete moca       ASSESSMENT AND PLAN  [name] is a 71 y.o. female  Dementia  Mini-Mental Status Examination 17/30  Previous MRI showed generalized atrophy, supratentorium small vessel disease  Most likely central nervous system degenerative disorder with vascular component, she does has vascular risk factor of aging, hypertension, hyperlipidemia, poorly controlled diabetes,  Emphasized importance of healthier lifestyle, regular sleep and eating pattern, moderate exercise, compliant with her medications, including aspirin 81 mg daily  We also discussed the possibility of treating with Namenda and Aricept, she already has difficult time compliant with her medications, decided not to start new medication  Gait abnormalities  Multifactorial, joint pain, deconditioning,  Refer to home physical therapy   DIAGNOSTIC DATA (LABS, IMAGING, TESTING) - I reviewed patient records, labs, notes, testing and imaging myself where available.   MEDICAL HISTORY:  [name] is a 71 year old female, accompanied by her son and sister, seen in request by primary care physician Dr. Dareen Piano, Donalee Citrin, for evaluation of memory loss, initial evaluation was on July 19, 2021  I reviewed and summarized the referring note. PMHX HTN Depression DM Hypothyrodism HLD  She used to work as a Sports coach for [employer], currently lives at home with her 2 adult children, there was no family history of memory loss, she was noted to have gradual onset of memory loss over the past couple years, getting worse, occasionally visual hallucinations, she denies headache, no visual changes  She spent most of the time in bed or in a sitting position, irregular eating and sleep pattern, ambulate with gait abnormality, denies significant pain, urinary  urgency, frequency, no bowel incontinence  She has slow reaction time, Mini-Mental Status Examination is 17 out of 30 today  Personally reviewed MRI of the brain without contrast April 17, 2019, motion degraded artifact, generalized atrophy, mild supratentorium small vessel disease  Laboratory in 23, normal TSH, A1c 9.3, previously was up to 14.4, BMP showed creatinine of 1.05,  PHYSICAL EXAM:   Vitals:   07/19/21 1539  BP: (!) 163/80  Pulse: 87  Weight: 135 lb (61.2 kg)  Height: _0  (1.575 m)   Body mass index is 24.69 kg/m.  PHYSICAL EXAMNIATION:  Gen: NAD, conversant, well nourised, well groomed                     Cardiovascular: Regular rate rhythm, no peripheral edema, warm, nontender. Eyes: Conjunctivae clear without exudates or hemorrhage Neck: Supple, no carotid bruits. Pulmonary: Clear to auscultation bilaterally   NEUROLOGICAL EXAM:  MENTAL STATUS: Speech/cognition: Awake, alert, oriented to history taking and casual conversation     07/19/2021    4:00 PM  MMSE - Mini Mental State Exam  Orientation to time 0  Orientation to Place 4  Registration 3  Attention/ Calculation 3  Recall 0  Language- name 2 objects 2  Language- repeat 1  Language- follow 3 step command 2  Language- read & follow direction 1  Write a sentence 1  Copy design 0  Total score 17    CRANIAL NERVES: CN II: Visual fields are full to confrontation. Pupils are round equal and briskly reactive to light. CN III, IV, VI: extraocular movement are normal. No ptosis. CN V: Facial sensation is intact to light touch CN VII: Face is symmetric  with normal eye closure  CN VIII: Hearing is normal to causal conversation. CN IX, X: Phonation is normal. CN XI: Head turning and shoulder shrug are intact  MOTOR: Right hand stiffness, reported due to previous residual deficit from right wrist surgery(for right carpal tunnel syndrome?),  No significant proximal upper extremity or bilateral lower  extremity muscle weakness  REFLEXES: Reflexes are hypoactive and symmetric at the biceps, triceps, knees, and ankles. Plantar responses are flexor.  SENSORY: Intact to light touch, pinprick   COORDINATION: There is no trunk or limb dysmetria noted.  GAIT/STANCE: Need push-up to get up from seated position, rely on her cane, unsteady  REVIEW OF SYSTEMS:  Full 14 system review of systems performed and notable only for as above All other review of systems were negative.   ALLERGIES: Allergies  Allergen Reactions   Gabapentin Other (See Comments)    Dizziness from 300 mg, tolerates 100 mg Dizziness from 300 mg, tolerates 100 mg    Meclizine Hcl Rash   Metformin Hcl Er Diarrhea    Patient did not tolerate Metformin ER but is able to take lower dose of Metformin   Penicillins Rash    Has patient had a PCN reaction causing immediate rash, facial/tongue/throat swelling, SOB or lightheadedness with hypotension: Yes Has patient had a PCN reaction causing severe rash involving mucus membranes or skin necrosis: No Has patient had a PCN reaction that required hospitalization: pt was in hospital at time of reaction Has patient had a PCN reaction occurring within the last 10 years: No If all of the above answers are "NO", then may proceed with Cephalosporin use.   Sulfa Antibiotics Rash    HOME MEDICATIONS: Current Outpatient Medications  Medication Sig Dispense Refill   Accu-Chek Softclix Lancets lancets Check blood sugar 5 times a day as instructed 200 each 10   ALPHAGAN P 0.1 % SOLN Place 1 drop into both eyes 3 (three) times daily.  2   amLODipine-olmesartan (AZOR) 5-20 MG tablet Take 1 tablet by mouth daily. 30 tablet 2   Besifloxacin HCl 0.6 % SUSP Besivance 0.6 % eye drops,suspension  INSTILL 1 DROP INTO THE LEFT EYE QID X 2 DAYS AFTER EACH MONTHLY EYE INJECTION     Blood Glucose Monitoring Suppl (ACCU-CHEK GUIDE ME) w/Device KIT Check blood sugar 5 times a day 1 kit 1    busPIRone (BUSPAR) 5 MG tablet Take 1 tablet (5 mg total) by mouth 2 (two) times daily. 180 tablet 3   Continuous Blood Gluc Sensor (DEXCOM G7 SENSOR) MISC Replace every 10 days 9 each 3   cycloSPORINE (RESTASIS) 0.05 % ophthalmic emulsion Restasis MultiDose 0.05 % eye drops  INT 1 GTT IN EACH EYE BID     empagliflozin (JARDIANCE) 25 MG TABS tablet Take 1 tablet (25 mg total) by mouth daily. 90 tablet 1   glucose 4 GM chewable tablet Chew 4 tablets (16 g total) by mouth as needed for low blood sugar. 50 tablet 12   glucose blood (ACCU-CHEK GUIDE) test strip Check blood sugar 5 times per day 150 each 11   glucose blood test strip OneTouch Verio test strips  USE TO CHECK BS BID     insulin degludec (TRESIBA) 100 UNIT/ML FlexTouch Pen Inject 13 Units into the skin at bedtime. 15 mL 1   Insulin Pen Needle (BD PEN NEEDLE NANO U/F) 32G X 4 MM MISC USE AS DIRECTED 100 each 6   ketorolac (ACULAR) 0.5 % ophthalmic solution Place 1 drop into  both eyes 3 (three) times daily.  12   levothyroxine (SYNTHROID) 50 MCG tablet Take 1 tablet (50 mcg total) by mouth daily before breakfast. 90 tablet 3   metFORMIN (GLUCOPHAGE-XR) 500 MG 24 hr tablet Take 2 tablets (1,000 mg total) by mouth daily with breakfast. 90 tablet 3   Nutritional Supplements (GLUCERNA SNACK SHAKE) LIQD Take 1 Dose by mouth 3 (three) times daily as needed. 30 Bottle 3   Olopatadine HCl 0.2 % SOLN Place 1 drop into both eyes daily as needed (dry eyes).      pioglitazone (ACTOS) 15 MG tablet Take 1 tablet (15 mg total) by mouth daily. 90 tablet 3   Polyethyl Glycol-Propyl Glycol (SYSTANE) 0.4-0.3 % GEL ophthalmic gel Place 1 application daily as needed into both eyes (dry eyes/irritation).      prednisoLONE acetate (PRED FORTE) 1 % ophthalmic suspension      rosuvastatin (CRESTOR) 20 MG tablet Take 1 tablet (20 mg total) by mouth daily. 90 tablet 3   venlafaxine XR (EFFEXOR-XR) 150 MG 24 hr capsule Take 1 capsule (150 mg total) by mouth daily  with breakfast. 90 capsule 3   No current facility-administered medications for this visit.    PAST MEDICAL HISTORY: Past Medical History:  Diagnosis Date   Arthritis    Bilateral hip pain 02/24/2015   Borderline glaucoma of both eyes    Depression    Diabetic polyneuropathy ([place])    Diverticulosis of colon    Dizziness 12/05/2017   Dry eyes, bilateral    Feeling of incomplete bladder emptying    Frequent falls 01/08/2018   History of breast cancer oncologist-  dr Waymon Budge-- per lov note no recurrence   dx 04/ 1999 --- Stage 3B-- s/p  chemotherapy then right mastectomy then concurrent chemoradiation therapy   History of urinary retention    01/ 2018   Hydronephrosis of right kidney    Hyperlipidemia    Hypertension    Hypokalemia 12/09/2016   Hypomagnesemia 12/09/2016   Hypothyroidism    Insulin dependent type 2 diabetes mellitus [facility])    endocrinologist-  dr Cruzita Lederer---  last A1c 8.7 on 02-20-2016   Left leg pain 03/15/2019   LOW BACK PAIN 04/16/2006   Qualifier: Diagnosis of  By: Leward Quan MD, Pamala Hurry     Lymphedema of upper extremity    right   Macular degeneration, left eye    followed by dr Zigmund Daniel   Renal insufficiency    Seizure-like activity ([place]) 04/17/2019   Urgency of urination    Visual hallucinations 01/12/2019    PAST SURGICAL HISTORY: Past Surgical History:  Procedure Laterality Date   CARPAL TUNNEL RELEASE Right 2017   and tendon repair   CATARACT EXTRACTION W/ INTRAOCULAR LENS  IMPLANT, BILATERAL  2017   CYSTO/  RIGHT RETROGRADE PYELOGRAM/  UNROOFING RIGHT URETEROCELE  09/18/2000   CYSTOSCOPY/RETROGRADE/URETEROSCOPY Right 05/09/2016   Procedure: CYSTOSCOPY and right RETROGRADE;  Surgeon: Irine Seal, MD;  Location[facility];  Service: Urology;  Laterality: Right;   FINGER SURGERY Left x2 prior to 09-09-2014   I & D EXTREMITY Left 09/09/2014   Procedure: IRRIGATION AND DEBRIDEMENT LEFT THUMB DISTAL PHALANX;  Surgeon: Daryll Brod, MD;   Location: [place];  Service: Orthopedics;  Laterality: Left;   MASTECTOMY Right 1999   w/  Node dissection's   PORT-A-CATH REMOVAL  05/01/1999   TUBAL LIGATION     VAGINAL HYSTERECTOMY  1970's   WRIST GANGLION EXCISION Right 2016    FAMILY  HISTORY: Family History  Problem Relation Age of Onset   Heart disease Father    Diabetes Father    Stroke Sister    Anuerysm Sister 41       brain; maternal half-sister   Heart disease Brother    Anuerysm Brother 29       aortic; maternal half-brother   Breast cancer Daughter 23       negative genetic testing in 2015   Heart attack Daughter        46-47   Diabetes Paternal Grandmother    Stroke Paternal Grandfather    Anuerysm Brother        NOS type; full brother   Fibroids Daughter        s/p hysterectomy at 84y   Brain cancer Maternal Uncle        dx. older than 71; NOS type   Brain cancer Cousin        maternal 1st cousin; d. early 80s; NOS type   Deafness Paternal Uncle        prelingual    SOCIAL HISTORY: Social History   Socioeconomic History   Marital status: Widowed    Spouse name: Not on file   Number of children: Not on file   Years of education: Not on file   Highest education level: Not on file  Occupational History   Not on file  Tobacco Use   Smoking status: Never   Smokeless tobacco: Never  Vaping Use   Vaping Use: Never used  Substance and Sexual Activity   Alcohol use: No    Alcohol/week: 0.0 standard drinks of alcohol   Drug use: No   Sexual activity: Not on file  Other Topics Concern   Not on file  Social History Narrative   Not on file   Social Determinants of Health   Financial Resource Strain: Low Risk  (06/07/2021)   Overall Financial Resource Strain (CARDIA)    Difficulty of Paying Living Expenses: Not hard at all  Food Insecurity: No Food Insecurity (06/07/2021)   Hunger Vital Sign    Worried About Running Out of Food in the Last Year: Never true    [place] in  the Last Year: Never true  Transportation Needs: No Transportation Needs (06/07/2021)   PRAPARE - Hydrologist (Medical): No    Lack of Transportation (Non-Medical): No  Physical Activity: Inactive (06/07/2021)   Exercise Vital Sign    Days of Exercise per Week: 0 days    Minutes of Exercise per Session: 0 min  Stress: No Stress Concern Present (06/07/2021)   [place]    Feeling of Stress : Not at all  Social Connections: Unknown (06/07/2021)   Social Connection and Isolation Panel [NHANES]    Frequency of Communication with Friends and Family: More than three times a week    Frequency of Social Gatherings with Friends and Family: More than three times a week    Attends Religious Services: Never    Marine scientist or Organizations: No    Attends Archivist Meetings: Never    Marital Status: Not on file  Intimate Partner Violence: Not At Risk (06/07/2021)   Humiliation, Afraid, Rape, and Kick questionnaire    Fear of Current or Ex-Partner: No    Emotionally Abused: No    Physically Abused: No    Sexually Abused: No      Ashanti Littles  Krista Blue, M.D. Ph.D.  [facility] [address], [place], [place] [address] Ph: [phone] Fax: [phone]  CC:  Aldine Contes, MD [address], [place],  [place] [address]  Aldine Contes, MD

## 2021-07-19 NOTE — Assessment & Plan Note (Signed)
Today's Vitals   07/19/21 1311  BP: (!) 162/77  Pulse: 90  Temp: 98.2 F (36.8 C)  TempSrc: Oral  SpO2: 100%  Weight: 135 lb 12.8 oz (61.6 kg)   Body mass index is 24.84 kg/m.  Patient with HTN, currently taking olmesartan '20mg'$  daily. BP remains elevated today but about 10-59mHg decrease in SBP since last visit. Will check BMP today and change patient's antihypertensive to combination of olmesartan '20mg'$  and amlodipine '5mg'$  (Azor) to obtain better BP control (goal <140/90). Will have her come back in 2 weeks for BP recheck.  Plan: -changed to olmesartan-amlodipine 20-'5mg'$  daily -f/u BMP -f/u visit in 2 weeks

## 2021-07-19 NOTE — Patient Instructions (Addendum)
[name],  It was a pleasure seeing you in the clinic today.   We are checking some blood work today. I will give you a call with the results. I have prescribed a combination blood pressure medicine (called amlodipine-olmesartan) for better control of your blood pressure. Please pick this up from your pharmacy. Please decrease your Insulin Tyler Aas) to 13 units every night. Please come back in 2 weeks for repeat blood pressure check.  Please call our clinic at [phone] if you have any questions or concerns. The best time to call is Monday-Friday from 9am-4pm, but there is someone available 24/7 at the same number. If you need medication refills, please notify your pharmacy one week in advance and they will send [place] a request.   Thank you for letting us take part in your care. We look forward to seeing you next time!

## 2021-07-19 NOTE — Assessment & Plan Note (Signed)
On crestor '20mg'$  daily, will check lipid panel today.

## 2021-07-20 LAB — BMP8+ANION GAP
Anion Gap: 15 mmol/L (ref 10.0–18.0)
BUN/Creatinine Ratio: 27 (ref 12–28)
BUN: 30 mg/dL — ABNORMAL HIGH (ref 8–27)
CO2: 24 mmol/L (ref 20–29)
Calcium: 9.7 mg/dL (ref 8.7–10.3)
Chloride: 101 mmol/L (ref 96–106)
Creatinine, Ser: 1.1 mg/dL — ABNORMAL HIGH (ref 0.57–1.00)
Glucose: 144 mg/dL — ABNORMAL HIGH (ref 70–99)
Potassium: 4.7 mmol/L (ref 3.5–5.2)
Sodium: 140 mmol/L (ref 134–144)
eGFR: 54 mL/min/{1.73_m2} — ABNORMAL LOW (ref >59)

## 2021-07-20 LAB — LIPID PANEL
Chol/HDL Ratio: 2.1 ratio (ref 0.0–4.4)
Cholesterol, Total: 117 mg/dL (ref 100–199)
HDL: 57 mg/dL (ref >39)
LDL Chol Calc (NIH): 47 mg/dL (ref 0–99)
Triglycerides: 56 mg/dL (ref 0–149)
VLDL Cholesterol Cal: 13 mg/dL (ref 5–40)

## 2021-07-21 ENCOUNTER — Emergency Department (HOSPITAL_COMMUNITY): Payer: 59

## 2021-07-21 ENCOUNTER — Other Ambulatory Visit: Payer: Self-pay

## 2021-07-21 ENCOUNTER — Emergency Department (HOSPITAL_COMMUNITY)
Admission: EM | Admit: 2021-07-21 | Discharge: 2021-07-21 | Disposition: A | Discharge status: Home / Self Care | Payer: 59 | Attending: Emergency Medicine | Admitting: Emergency Medicine

## 2021-07-21 DIAGNOSIS — R4182 Altered mental status, unspecified: Secondary | ICD-10-CM | POA: Diagnosis present

## 2021-07-21 DIAGNOSIS — W06XXXD Fall from bed, subsequent encounter: Secondary | ICD-10-CM | POA: Diagnosis not present

## 2021-07-21 DIAGNOSIS — S0181XA Laceration without foreign body of other part of head, initial encounter: Secondary | ICD-10-CM | POA: Diagnosis not present

## 2021-07-21 DIAGNOSIS — S0990XA Unspecified injury of head, initial encounter: Secondary | ICD-10-CM

## 2021-07-21 DIAGNOSIS — R079 Chest pain, unspecified: Secondary | ICD-10-CM | POA: Insufficient documentation

## 2021-07-21 DIAGNOSIS — W19XXXA Unspecified fall, initial encounter: Secondary | ICD-10-CM

## 2021-07-21 LAB — COMPREHENSIVE METABOLIC PANEL
ALT: 14 U/L (ref 0–44)
AST: 26 U/L (ref 15–41)
Albumin: 3.9 g/dL (ref 3.5–5.0)
Alkaline Phosphatase: 45 U/L (ref 38–126)
Anion gap: 9 (ref 5–15)
BUN: 26 mg/dL — ABNORMAL HIGH (ref 8–23)
CO2: 25 mmol/L (ref 22–32)
Calcium: 9.4 mg/dL (ref 8.9–10.3)
Chloride: 106 mmol/L (ref 98–111)
Creatinine, Ser: 1.21 mg/dL — ABNORMAL HIGH (ref 0.44–1.00)
GFR, Estimated: 48 mL/min — ABNORMAL LOW (ref >60)
Glucose, Bld: 87 mg/dL (ref 70–99)
Potassium: 3.9 mmol/L (ref 3.5–5.1)
Sodium: 140 mmol/L (ref 135–145)
Total Bilirubin: 0.4 mg/dL (ref 0.3–1.2)
Total Protein: 7 g/dL (ref 6.5–8.1)

## 2021-07-21 LAB — CBC
HCT: 33.5 % — ABNORMAL LOW (ref 36.0–46.0)
Hemoglobin: 11.1 g/dL — ABNORMAL LOW (ref 12.0–15.0)
MCH: 31.6 pg (ref 26.0–34.0)
MCHC: 33.1 g/dL (ref 30.0–36.0)
MCV: 95.4 fL (ref 80.0–100.0)
Platelets: 246 10*3/uL (ref 150–400)
RBC: 3.51 MIL/uL — ABNORMAL LOW (ref 3.87–5.11)
RDW: 13.5 % (ref 11.5–15.5)
WBC: 7.9 10*3/uL (ref 4.0–10.5)
nRBC: 0 % (ref 0.0–0.2)

## 2021-07-21 LAB — PROTIME-INR
INR: 0.9 (ref 0.8–1.2)
Prothrombin Time: 12.5 seconds (ref 11.4–15.2)

## 2021-07-21 LAB — URINALYSIS, ROUTINE W REFLEX MICROSCOPIC
Bacteria, UA: NONE SEEN
Bilirubin Urine: NEGATIVE
Glucose, UA: >=500 mg/dL — AB
Hgb urine dipstick: NEGATIVE
Ketones, ur: NEGATIVE mg/dL
Leukocytes,Ua: NEGATIVE
Nitrite: NEGATIVE
Protein, ur: NEGATIVE mg/dL
Specific Gravity, Urine: 1.006 (ref 1.005–1.030)
pH: 7 (ref 5.0–8.0)

## 2021-07-21 LAB — ETHANOL: Alcohol, Ethyl (B): <10 mg/dL (ref <10)

## 2021-07-21 LAB — SAMPLE TO BLOOD BANK

## 2021-07-21 LAB — LACTIC ACID, PLASMA: Lactic Acid, Venous: 2.4 mmol/L (ref 0.5–1.9)

## 2021-07-21 MED ORDER — SODIUM CHLORIDE 0.9 % IV BOLUS
500.0000 mL | Freq: Once | INTRAVENOUS | Status: AC
  Administered 2021-07-21: 500 mL via INTRAVENOUS

## 2021-07-21 MED ORDER — ACETAMINOPHEN 325 MG PO TABS
650.0000 mg | ORAL_TABLET | Freq: Once | ORAL | Status: AC
  Administered 2021-07-21: 650 mg via ORAL
  Filled 2021-07-21: qty 2

## 2021-07-21 NOTE — ED Notes (Signed)
Patient transported to CT with this RN 

## 2021-07-23 ENCOUNTER — Telehealth: Payer: Self-pay | Admitting: Neurology

## 2021-07-23 NOTE — Telephone Encounter (Signed)
[facility] is taking this patient

## 2021-07-25 ENCOUNTER — Telehealth: Payer: Self-pay | Admitting: *Deleted

## 2021-07-25 DIAGNOSIS — R269 Unspecified abnormalities of gait and mobility: Secondary | ICD-10-CM

## 2021-07-25 DIAGNOSIS — R4189 Other symptoms and signs involving cognitive functions and awareness: Secondary | ICD-10-CM

## 2021-07-25 NOTE — Telephone Encounter (Signed)
Call from pt's son, [name], who stated pt rolled out of bed onto the floor and she was taken to the ER on 6/24. Stated she did hit her head. Pt is requesting a hospital bed with rails.

## 2021-08-02 ENCOUNTER — Ambulatory Visit (INDEPENDENT_AMBULATORY_CARE_PROVIDER_SITE_OTHER): Payer: 59 | Admitting: Student

## 2021-08-02 ENCOUNTER — Ambulatory Visit
Admission: RE | Admit: 2021-08-02 | Discharge: 2021-08-02 | Disposition: A | Discharge status: Home / Self Care | Payer: 59 | Source: Ambulatory Visit | Attending: Internal Medicine | Admitting: Internal Medicine

## 2021-08-02 ENCOUNTER — Encounter: Payer: Self-pay | Admitting: Student

## 2021-08-02 VITALS — BP 141/52 | HR 96 | Temp 98.2°F | Ht 62.0 in | Wt 137.1 lb

## 2021-08-02 DIAGNOSIS — E1142 Type 2 diabetes mellitus with diabetic polyneuropathy: Secondary | ICD-10-CM | POA: Diagnosis not present

## 2021-08-02 DIAGNOSIS — W19XXXD Unspecified fall, subsequent encounter: Secondary | ICD-10-CM

## 2021-08-02 DIAGNOSIS — I10 Essential (primary) hypertension: Secondary | ICD-10-CM | POA: Diagnosis not present

## 2021-08-02 DIAGNOSIS — Z794 Long term (current) use of insulin: Secondary | ICD-10-CM

## 2021-08-02 DIAGNOSIS — Z853 Personal history of malignant neoplasm of breast: Secondary | ICD-10-CM

## 2021-08-02 DIAGNOSIS — W19XXXA Unspecified fall, initial encounter: Secondary | ICD-10-CM

## 2021-08-02 HISTORY — DX: Unspecified fall, initial encounter: W19.XXXA

## 2021-08-02 LAB — POCT GLYCOSYLATED HEMOGLOBIN (HGB A1C): Hemoglobin A1C: 10 % — AB (ref 4.0–5.6)

## 2021-08-02 LAB — GLUCOSE, CAPILLARY: Glucose-Capillary: 236 mg/dL — ABNORMAL HIGH (ref 70–99)

## 2021-08-02 MED ORDER — TRULICITY 0.75 MG/0.5ML ~~LOC~~ SOAJ: 0.7500 mg | SUBCUTANEOUS | 1 refills | Status: DC

## 2021-08-02 NOTE — Assessment & Plan Note (Signed)
A1c improved to 10 today.  Her glucometer reports fortunately did not show any hypoglycemic events.  She only has 42% of CBG within range with 50% above goal.  Patient denies any hypoglycemic events.  She eats about 2 meals a day, reports balance diet.  Her daughter helps with medications.  Patient has cognitive impairment and does not have good understanding of her medical conditions and medications.  Current regimen: -Jardiance 25 mg daily -Metformin 1000 mg daily -Actos 15 mg daily -Tresiba 13 units at bedtime  Will not adjust her insulin at this time.  Ideally, adding mealtime insulin will help but this may complicate her regimen.  I asked about side effects of metformin but she is not aware of any.  She was on Ozempic in the past but was discontinued due to significant weight loss.  Will start Trulicity at the lowest dose.  Patient denies history of pancreatitis.  -Continue Jardiance, metformin and Actos -Continue Tresiba 30 units at bedtime -Start Trulicity 6.56 mg weekly -Follow-up in 4 weeks

## 2021-08-02 NOTE — Progress Notes (Signed)
CC: 2 weeks follow-up on diabetes  HPI:  [name] is a 71 y.o. with past medical history of hypertension, hypothyroidism, type 2 diabetes, DDD with left sciatica who presents to the clinic today for 2 weeks follow-up on her diabetes.  Please see problem based charting for detail  Past Medical History:  Diagnosis Date   Arthritis    Bilateral hip pain 02/24/2015   Borderline glaucoma of both eyes    Depression    Diabetic polyneuropathy ([place])    Diverticulosis of colon    Dizziness 12/05/2017   Dry eyes, bilateral    Feeling of incomplete bladder emptying    Frequent falls 01/08/2018   History of breast cancer oncologist-  dr Waymon Budge-- per lov note no recurrence   dx 04/ 1999 --- Stage 3B-- s/p  chemotherapy then right mastectomy then concurrent chemoradiation therapy   History of urinary retention    01/ 2018   Hydronephrosis of right kidney    Hyperlipidemia    Hypertension    Hypokalemia 12/09/2016   Hypomagnesemia 12/09/2016   Hypothyroidism    Insulin dependent type 2 diabetes mellitus [facility])    endocrinologist-  dr Cruzita Lederer---  last A1c 8.7 on 02-20-2016   Left leg pain 03/15/2019   LOW BACK PAIN 04/16/2006   Qualifier: Diagnosis of  By: Leward Letzy Gullickson MD, Pamala Hurry     Lymphedema of upper extremity    right   Macular degeneration, left eye    followed by dr Zigmund Daniel   Renal insufficiency    Seizure-like activity ([place]) 04/17/2019   Urgency of urination    Visual hallucinations 01/12/2019   Review of Systems: Per HPI  Physical Exam:  Vitals:   08/02/21 1330  BP: (!) 141/52  Pulse: 96  Temp: 98.2 F (36.8 C)  TempSrc: Oral  SpO2: 100%  Weight: 137 lb 1.6 oz (62.2 kg)  Height: '5\' 2"'$  (1.575 m)   Physical Exam Constitutional:      General: She is not in acute distress.    Appearance: She is not ill-appearing.  HENT:     Head: Normocephalic.     Comments: There is a well-healed hematoma inferior in the left eye and a smaller one on her right  eye. Eyes:     General:        Right eye: No discharge.        Left eye: No discharge.     Conjunctiva/sclera: Conjunctivae normal.  Cardiovascular:     Rate and Rhythm: Normal rate and regular rhythm.     Comments: +1 edema bilateral feet Pulmonary:     Effort: Pulmonary effort is normal. No respiratory distress.     Breath sounds: Normal breath sounds.  Abdominal:     General: Bowel sounds are normal.     Palpations: Abdomen is soft.  Skin:    General: Skin is warm.  Neurological:     General: No focal deficit present.     Mental Status: She is alert.  Psychiatric:        Mood and Affect: Mood normal.    Assessment & Plan:   See Encounters Tab for problem based charting.  Type 2 diabetes mellitus with diabetic polyneuropathy, with long-term current use of insulin (HCC) A1c improved to 10 today.  Her glucometer reports fortunately did not show any hypoglycemic events.  She only has 42% of CBG within range with 50% above goal.  Patient denies any hypoglycemic events.  She eats about 2 meals a day, reports balance  diet.  Her daughter helps with medications.  Patient has cognitive impairment and does not have good understanding of her medical conditions and medications.  Current regimen: -Jardiance 25 mg daily -Metformin 1000 mg daily -Actos 15 mg daily -Tresiba 13 units at bedtime  Will not adjust her insulin at this time.  Ideally, adding mealtime insulin will help but this may complicate her regimen.  I asked about side effects of metformin but she is not aware of any.  She was on Ozempic in the past but was discontinued due to significant weight loss.  Will start Trulicity at the lowest dose.  Patient denies history of pancreatitis.  -Continue Jardiance, metformin and Actos -Continue Tresiba 30 units at bedtime -Start Trulicity 2.70 mg weekly -Follow-up in 4 weeks   HTN, goal below 140/90 Blood pressure within goal today.  -Continue amlodipine-olmesartan 5-20 mg  daily   Fall Patient had an ED visit 07/21/2021 after a fall which she rolled out of bed while sleeping.  She suffered from a small hematoma/laceration but otherwise no serious complication.  Given her cognitive impairment and recent fall, she will benefit from having a hospital bed at home.  -DME order for hospital bed placed   Patient discussed with Dr. Dareen Piano

## 2021-08-02 NOTE — Assessment & Plan Note (Signed)
Blood pressure within goal today.  -Continue amlodipine-olmesartan 5-20 mg daily

## 2021-08-02 NOTE — Assessment & Plan Note (Signed)
Patient had an ED visit 07/21/2021 after a fall which she rolled out of bed while sleeping.  She suffered from a small hematoma/laceration but otherwise no serious complication.  Given her cognitive impairment and recent fall, she will benefit from having a hospital bed at home.  -DME order for hospital bed placed

## 2021-08-06 ENCOUNTER — Telehealth: Payer: Self-pay

## 2021-08-06 NOTE — Telephone Encounter (Signed)
Returned call to patient's son. States he did not accompany patient to OV on 7/6, and Epic was down so did not receive AVS. He knows Rx was sent for Trulicity and wants to know if any meds were d/c. Per OV note, patient was to continue all other meds and add Trulicity. He wants to ensure Trulicity will not have same effect as Ozempic, which caused patient loss of appetite and weight loss.  AVS printed and mailed to patient's home address at son's request.

## 2021-08-06 NOTE — Progress Notes (Signed)
Internal Medicine Clinic Attending  Case discussed with Dr. Nguyen  At the time of the visit.  We reviewed the resident's history and exam and pertinent patient test results.  I agree with the assessment, diagnosis, and plan of care documented in the resident's note. 

## 2021-08-06 NOTE — Telephone Encounter (Signed)
Pt's son requesting to speak with a nurse about medications. Please call back.

## 2021-08-08 NOTE — Telephone Encounter (Signed)
Relayed info below to patient's son. He will let us know if patient has any further weight loss or decrease in appetite.

## 2021-08-27 ENCOUNTER — Encounter: Payer: Self-pay | Admitting: Internal Medicine

## 2021-08-28 ENCOUNTER — Encounter: Payer: Self-pay | Admitting: Internal Medicine

## 2021-08-28 ENCOUNTER — Other Ambulatory Visit: Payer: Self-pay

## 2021-08-28 ENCOUNTER — Ambulatory Visit (INDEPENDENT_AMBULATORY_CARE_PROVIDER_SITE_OTHER): Payer: 59 | Admitting: Internal Medicine

## 2021-08-28 DIAGNOSIS — E559 Vitamin D deficiency, unspecified: Secondary | ICD-10-CM

## 2021-08-28 DIAGNOSIS — E1142 Type 2 diabetes mellitus with diabetic polyneuropathy: Secondary | ICD-10-CM

## 2021-08-28 DIAGNOSIS — Z794 Long term (current) use of insulin: Secondary | ICD-10-CM

## 2021-08-28 DIAGNOSIS — I10 Essential (primary) hypertension: Secondary | ICD-10-CM

## 2021-08-28 DIAGNOSIS — E1122 Type 2 diabetes mellitus with diabetic chronic kidney disease: Secondary | ICD-10-CM | POA: Diagnosis not present

## 2021-08-28 DIAGNOSIS — W19XXXD Unspecified fall, subsequent encounter: Secondary | ICD-10-CM

## 2021-08-28 DIAGNOSIS — N183 Chronic kidney disease, stage 3 unspecified: Secondary | ICD-10-CM | POA: Diagnosis not present

## 2021-08-28 DIAGNOSIS — I129 Hypertensive chronic kidney disease with stage 1 through stage 4 chronic kidney disease, or unspecified chronic kidney disease: Secondary | ICD-10-CM

## 2021-08-28 DIAGNOSIS — E039 Hypothyroidism, unspecified: Secondary | ICD-10-CM

## 2021-08-28 DIAGNOSIS — E785 Hyperlipidemia, unspecified: Secondary | ICD-10-CM

## 2021-08-28 DIAGNOSIS — R4189 Other symptoms and signs involving cognitive functions and awareness: Secondary | ICD-10-CM

## 2021-08-28 DIAGNOSIS — R634 Abnormal weight loss: Secondary | ICD-10-CM

## 2021-08-28 DIAGNOSIS — D649 Anemia, unspecified: Secondary | ICD-10-CM

## 2021-08-28 DIAGNOSIS — Z7984 Long term (current) use of oral hypoglycemic drugs: Secondary | ICD-10-CM

## 2021-08-28 DIAGNOSIS — F32A Depression, unspecified: Secondary | ICD-10-CM

## 2021-08-28 MED ORDER — METFORMIN HCL ER 500 MG PO TB24: ORAL_TABLET | ORAL | 3 refills | Status: DC

## 2021-08-28 MED ORDER — INSULIN DEGLUDEC 100 UNIT/ML ~~LOC~~ SOPN: 13.0000 [IU] | PEN_INJECTOR | Freq: Every day | SUBCUTANEOUS | 1 refills | Status: DC

## 2021-08-28 NOTE — Assessment & Plan Note (Signed)
Patient has chronic stable cognitive impairment. Patient has strong family support and son was able to get a home aide to help patient with ADLs. Patient reports that aide is helping her with tasks and her medications several times a week. MOCA in office today was 14/30. Patient saw Neurology last month--per their note, MMSE was 17/30 and they offered Namenda and Aricept but did not order it due to patient's difficulty with existing medication burden. Will continue to follow.

## 2021-08-28 NOTE — Progress Notes (Signed)
Subjective:   Patient ID: [name] female   DOB: 03/06/1950 71 y.o.   MRN: [id number]  HPI: [name] is a 71 y.o. with a pmh significant for T2DM and HTN who presents for follow-up of her chronic conditions. Please see Plan for individualized problem-based charting.   Patient Active Problem List   Diagnosis Date Noted   Fall 08/02/2021   Advanced care planning/counseling discussion 05/29/2021   Urinary incontinence, mixed 06/16/2020   Vitamin D deficiency 08/17/2019   Weight loss 08/17/2019   Carpal tunnel syndrome 06/01/2019   Cognitive impairment 01/13/2019   Insomnia 06/11/2018   Hydronephrosis 04/01/2017   Osteoarthritis of facet joint of lumbar spine 02/04/2017   Sciatica of left side without back pain 01/30/2017   Neuropathy 12/09/2016   Genetic testing 11/27/2015   Primary osteoarthritis of first carpometacarpal joint of right hand 07/06/2015   Preventative health care 08/26/2012   HTN, goal below 140/90 08/13/2012   CKD (chronic kidney disease) stage 3, GFR 30-59 ml/min ([place]) 08/13/2012   Glaucoma associated with systemic syndromes(365.44) 08/07/2012   Hypothyroidism 04/16/2006   Anemia 04/16/2006   Depression 04/16/2006   BREAST CANCER, HX OF 04/16/2006   Hyperlipidemia 10/13/2003   Type 2 diabetes mellitus with diabetic polyneuropathy, with long-term current use of insulin ([place]) 04/16/1991     Current Outpatient Medications  Medication Sig Dispense Refill   Accu-Chek Softclix Lancets lancets Check blood sugar 5 times a day as instructed 200 each 10   ALPHAGAN P 0.1 % SOLN Place 1 drop into both eyes 3 (three) times daily.  2   amLODipine-olmesartan (AZOR) 5-20 MG tablet Take 1 tablet by mouth daily. 30 tablet 2   Besifloxacin HCl 0.6 % SUSP Besivance 0.6 % eye drops,suspension  INSTILL 1 DROP INTO THE LEFT EYE QID X 2 DAYS AFTER EACH MONTHLY EYE INJECTION     Blood Glucose Monitoring Suppl (ACCU-CHEK GUIDE ME) w/Device KIT Check blood sugar 5  times a day 1 kit 1   busPIRone (BUSPAR) 5 MG tablet Take 1 tablet (5 mg total) by mouth 2 (two) times daily. 180 tablet 3   Continuous Blood Gluc Sensor (DEXCOM G7 SENSOR) MISC Replace every 10 days 9 each 3   cycloSPORINE (RESTASIS) 0.05 % ophthalmic emulsion Restasis MultiDose 0.05 % eye drops  INT 1 GTT IN EACH EYE BID     Dulaglutide (TRULICITY) 7.09 GG/8.3MO SOPN Inject 0.75 mg into the skin once a week. 2 mL 1   empagliflozin (JARDIANCE) 25 MG TABS tablet Take 1 tablet (25 mg total) by mouth daily. 90 tablet 1   glucose 4 GM chewable tablet Chew 4 tablets (16 g total) by mouth as needed for low blood sugar. 50 tablet 12   glucose blood (ACCU-CHEK GUIDE) test strip Check blood sugar 5 times per day 150 each 11   glucose blood test strip OneTouch Verio test strips  USE TO CHECK BS BID     insulin degludec (TRESIBA) 100 UNIT/ML FlexTouch Pen Inject 13 Units into the skin at bedtime. 15 mL 1   Insulin Pen Needle (BD PEN NEEDLE NANO U/F) 32G X 4 MM MISC USE AS DIRECTED 100 each 6   ketorolac (ACULAR) 0.5 % ophthalmic solution Place 1 drop into both eyes 3 (three) times daily.  12   levothyroxine (SYNTHROID) 50 MCG tablet Take 1 tablet (50 mcg total) by mouth daily before breakfast. 90 tablet 3   metFORMIN (GLUCOPHAGE-XR) 500 MG 24 hr tablet Take 2 tablets  by mouth in the morning and 1 tab by mouth in the evening with meals 90 tablet 3   Nutritional Supplements (GLUCERNA SNACK SHAKE) LIQD Take 1 Dose by mouth 3 (three) times daily as needed. 30 Bottle 3   Olopatadine HCl 0.2 % SOLN Place 1 drop into both eyes daily as needed (dry eyes).      pioglitazone (ACTOS) 15 MG tablet Take 1 tablet (15 mg total) by mouth daily. 90 tablet 3   Polyethyl Glycol-Propyl Glycol (SYSTANE) 0.4-0.3 % GEL ophthalmic gel Place 1 application daily as needed into both eyes (dry eyes/irritation).      prednisoLONE acetate (PRED FORTE) 1 % ophthalmic suspension      rosuvastatin (CRESTOR) 20 MG tablet Take 1 tablet (20  mg total) by mouth daily. 90 tablet 3   venlafaxine XR (EFFEXOR-XR) 150 MG 24 hr capsule Take 1 capsule (150 mg total) by mouth daily with breakfast. 90 capsule 3   vitamin B-12 (CYANOCOBALAMIN) 500 MCG tablet Take 500 mcg by mouth daily.     No current facility-administered medications for this visit.     Review of Systems: Review of systems is negative other than what is noted in individual problem-based charting.    Objective:   Physical Exam: Vitals:   08/28/21 1020 08/28/21 1029  BP: (!) 155/80 137/82  Pulse: (!) 103 99  Resp: (!) 24   Temp: 99.3 F (37.4 C)   TempSrc: Oral   SpO2: 100%   Weight: 133 lb 8 oz (60.6 kg)   Height: 5' 2"  (1.575 m)    Physical Exam Constitutional:      General: She is not in acute distress.    Appearance: Normal appearance.  Cardiovascular:     Rate and Rhythm: Normal rate and regular rhythm.     Heart sounds: No murmur heard.    No friction rub. No gallop.  Pulmonary:     Effort: Pulmonary effort is normal. No respiratory distress.     Breath sounds: No wheezing.  Abdominal:     General: Abdomen is flat. There is no distension.     Palpations: Abdomen is soft.     Tenderness: There is no abdominal tenderness.  Skin:    General: Skin is warm and dry.  Neurological:     Mental Status: She is alert. Mental status is at baseline.      Assessment & Plan:   Type 2 diabetes mellitus with diabetic polyneuropathy, with long-term current use of insulin ([name]) Patient has chronic uncontrolled T2DM that is improving. Patient is on Jardiance, Metformin, Actos, Tresiba 78H, and Trulicity 8.85OY. Patient's glucometer shows peaks of high blood sugar between 9PM-1AM and 9AM-12PM, suspect that this is related to timing of her meals. No lows recorded. Patient reports feeling well and eating a balanced diet. Patient denies nausea, vomiting, or bowel movement changes. Patient has strong family support who help her with her medications.   Patient is  currently on Metformin 1037m once daily, so will plan to increase her to 10071mmorning and 50024mt night. At next visit, if tolerating well, we can increase her to 1000m68mD. Her TresTyler Aas been titrated down several times over the last two months from 24>>20>>15>>13 due to recurrent hypoglycemia at higher doses. As patient has not had any lows on her current dose of 13 units, this seems to be working well and would ideally keep her on this regimen. Patient has also been doing well with the addition of the Trulicity and  weight has been stable in the low 130's over the last several months. We have previously been concerned about her weight loss, so we would prefer to hold off on increasing Trulicity for now. If she continues to have poor glycemic control after up-titrating Metformin, could increase Trulicity to 3.2WQ.   Plan: -Increase Metformin to 1011m in the morning and 5046mat night. Consider increasing to 100042mID if tolerated well at next visit.  -Continue TreTyler AasU -Continue Jardiance 63m58montinue Actos 15mg37DKntinue Trulicity 0.754.46FJ Cognitive impairment Patient has chronic stable cognitive impairment. Patient has strong family support and son was able to get a home aide to help patient with ADLs. Patient reports that aide is helping her with tasks and her medications several times a week. MOCA in office today was 14/30. Patient saw Neurology last month--per their note, MMSE was 17/30 and they offered Namenda and Aricept but did not order it due to patient's difficulty with existing medication burden. Will continue to follow.   Fall Patient reports that they still have not received any communication regarding getting a hospital bed. Patient also has a concern that her cane is too heavy and she often drags it behind her. Patient denies any recent falls. Office staff have reached out to ensure follow-up for hospital bed delivery, and we will follow up on if insurance can cover  a lighter cane.   Vitamin D deficiency -This problem is chronic and stable. Did not check Vit D this visit as patient had no other bloodwork.  -Check Vitamin D level next visit.   Hypothyroidism -This problem is chronic and stable.  -Will check TSH next visit.   Hyperlipidemia -This problem is chronic and stable -Will check lipid profile next visit  Anemia -This problem is chronic and stable -Will check CBC next visit  Depression Patient reports that she is doing well and endorses good mood. She denies any insomnia or loss of interest in activities. PHQ-9 today is 3. Plan: -Continue Effexor XR 150mg55mly -Continue buspirone 5mg t68me daily

## 2021-08-28 NOTE — Assessment & Plan Note (Addendum)
-  This problem is chronic and stable.  -Will check TSH next visit.

## 2021-08-28 NOTE — Assessment & Plan Note (Addendum)
Patient reports that they still have not received any communication regarding getting a hospital bed. Patient also has a concern that her cane is too heavy and she often drags it behind her. Patient denies any recent falls. Office staff have reached out to ensure follow-up for hospital bed delivery, and we will follow up on if insurance can cover a lighter cane.

## 2021-08-28 NOTE — Assessment & Plan Note (Signed)
-  This problem is chronic and stable. Did not check Vit D this visit as patient had no other bloodwork.  -Check Vitamin D level next visit.

## 2021-08-28 NOTE — Patient Instructions (Addendum)
It was a pleasure seeing you today [name]! If you have any issues filling your prescriptions, please let us know.   Your blood pressure (137/82) was great today.  Diabetes We are increasing your Metformin, please take '1000mg'$  in the morning and '500mg'$  at night. Continue to eat a healthy and balanced diet.   2. Hospital Bed You should receive a call about the hospital bed, please make sure you answer the phone. The call may come from an unknown number.   We have sent in your refills and we will do some bloodwork at your next visit.

## 2021-08-28 NOTE — Assessment & Plan Note (Signed)
Patient reports that she is doing well and endorses good mood. She denies any insomnia or loss of interest in activities. PHQ-9 today is 3. Plan: -Continue Effexor XR '150mg'$  daily -Continue buspirone '5mg'$  twice daily

## 2021-08-28 NOTE — Assessment & Plan Note (Addendum)
Patient has chronic uncontrolled T2DM that is improving. Patient is on Jardiance, Metformin, Actos, Tresiba 48N, and Trulicity 0.'75mg'$ . Patient's glucometer shows peaks of high blood sugar between 9PM-1AM and 9AM-12PM, suspect that this is related to timing of her meals. No lows recorded. Patient reports feeling well and eating a balanced diet. Patient denies nausea, vomiting, or bowel movement changes. Patient has strong family support who help her with her medications.   Patient is currently on Metformin '1000mg'$  once daily, so will plan to increase her to '1000mg'$  morning and '500mg'$  at night. At next visit, if tolerating well, we can increase her to '1000mg'$  BID. Her Tyler Aas has been titrated down several times over the last two months from 24>>20>>15>>13 due to recurrent hypoglycemia at higher doses. As patient has not had any lows on her current dose of 13 units, this seems to be working well and would ideally keep her on this regimen. Patient has also been doing well with the addition of the Trulicity and weight has been stable in the low 130's over the last several months. We have previously been concerned about her weight loss, so we would prefer to hold off on increasing Trulicity for now. If she continues to have poor glycemic control after up-titrating Metformin, could increase Trulicity to 1.'5mg'$ .   Plan: -Increase Metformin to '1000mg'$  in the morning and '500mg'$  at night. Consider increasing to '1000mg'$  BID if tolerated well at next visit.  -Continue Tyler Aas 13U -Continue Jardiance '25mg'$  -Continue Actos '15mg'$  -Continue Trulicity 0.'75mg'$ 

## 2021-08-28 NOTE — Assessment & Plan Note (Signed)
-  This problem is chronic and stable -Will check lipid profile next visit

## 2021-08-28 NOTE — Assessment & Plan Note (Signed)
-  This problem is chronic and stable -Will check CBC next visit

## 2021-08-29 NOTE — Assessment & Plan Note (Signed)
-   This problem is chronic and stable - Last BMP with stable creatinine of 1.2 - No urinary complaints at this time - Will repeat BMP at next visit

## 2021-08-29 NOTE — Progress Notes (Signed)
Attestation for Student Documentation:  I personally was present and performed or re-performed the history, physical exam and medical decision-making activities of this service and have verified that the service and findings are accurately documented in the student's note.  Aldine Contes, MD 08/29/2021, 12:47 PM

## 2021-08-29 NOTE — Assessment & Plan Note (Signed)
  Vitals:   08/28/21 1020 08/28/21 1029  BP: (!) 155/80 137/82  Pulse: (!) 103 99  Resp: (!) 24   Temp: 99.3 F (37.4 C)   SpO2: 100%    - This problem is chronic and stable - Initial BP was mildly elevated with SBP in the 150s but repeat BP with SBP in the 130s. - Patient previously had issues with orthostatic hypotension so will keep goal BP <140 for now instead of <130 - Will continue with current medication regimen (amlodipine/olmesartan 5-20 mg) - No further work up at this time

## 2021-08-29 NOTE — Assessment & Plan Note (Signed)
-   This problem is chronic and stable - I suspect weight loss was multifactorial secondary to uncontrolled DM, cognitive issues and difficult meal prep as well as being on ozempic - Patient now with aide at home to help with meals and medication - Ozempic was dc'd and patient currently on low dose of trulicity - Weight is stable in the 130s. Will continue to monitor

## 2021-09-10 ENCOUNTER — Other Ambulatory Visit: Payer: Self-pay | Admitting: Student

## 2021-09-10 ENCOUNTER — Telehealth: Payer: Self-pay | Admitting: Internal Medicine

## 2021-09-10 DIAGNOSIS — E1142 Type 2 diabetes mellitus with diabetic polyneuropathy: Secondary | ICD-10-CM

## 2021-09-10 NOTE — Telephone Encounter (Signed)
Pls call back and speak with the pt's son [name] in reference to his mother blood sugar levels being low in the mornings.  The lowest is 62.

## 2021-09-11 NOTE — Telephone Encounter (Signed)
Spoke with son and Dr. Philipp Ovens about patient having blood sugars in the 60s at night. Able to confirm this on her Dexcom clarity report. Patient's son notified and verbalized understanding to lower her Tyler Aas does to 10 units nightly per Dr. Philipp Ovens.  I suggested that if she continues to have low blood sugars lower the dose by 1 unit and call the office.

## 2021-09-13 ENCOUNTER — Other Ambulatory Visit: Payer: Self-pay

## 2021-09-13 MED ORDER — DEXCOM G7 SENSOR MISC: 3 refills | Status: DC

## 2021-09-27 LAB — HM DIABETES EYE EXAM

## 2021-10-02 ENCOUNTER — Ambulatory Visit (INDEPENDENT_AMBULATORY_CARE_PROVIDER_SITE_OTHER): Payer: 59 | Admitting: Internal Medicine

## 2021-10-02 VITALS — BP 144/75 | HR 94 | Temp 98.4°F | Ht 62.0 in | Wt 134.6 lb

## 2021-10-02 DIAGNOSIS — E785 Hyperlipidemia, unspecified: Secondary | ICD-10-CM | POA: Diagnosis not present

## 2021-10-02 DIAGNOSIS — R634 Abnormal weight loss: Secondary | ICD-10-CM

## 2021-10-02 DIAGNOSIS — I10 Essential (primary) hypertension: Secondary | ICD-10-CM

## 2021-10-02 DIAGNOSIS — E1142 Type 2 diabetes mellitus with diabetic polyneuropathy: Secondary | ICD-10-CM | POA: Diagnosis not present

## 2021-10-02 DIAGNOSIS — E039 Hypothyroidism, unspecified: Secondary | ICD-10-CM | POA: Diagnosis not present

## 2021-10-02 DIAGNOSIS — Z0000 Encounter for general adult medical examination without abnormal findings: Secondary | ICD-10-CM

## 2021-10-02 DIAGNOSIS — Z23 Encounter for immunization: Secondary | ICD-10-CM

## 2021-10-02 DIAGNOSIS — E559 Vitamin D deficiency, unspecified: Secondary | ICD-10-CM | POA: Diagnosis not present

## 2021-10-02 DIAGNOSIS — W19XXXD Unspecified fall, subsequent encounter: Secondary | ICD-10-CM

## 2021-10-02 DIAGNOSIS — Z794 Long term (current) use of insulin: Secondary | ICD-10-CM

## 2021-10-02 DIAGNOSIS — D649 Anemia, unspecified: Secondary | ICD-10-CM

## 2021-10-02 DIAGNOSIS — G5601 Carpal tunnel syndrome, right upper limb: Secondary | ICD-10-CM

## 2021-10-02 DIAGNOSIS — G629 Polyneuropathy, unspecified: Secondary | ICD-10-CM

## 2021-10-02 NOTE — Assessment & Plan Note (Signed)
Patient has chronic stable hypertension, managed with amlodipine-olmesartan. Patient denies any dizziness or recent falls. BP today was initially 155/64 and 144/75 on retake. Plan: -Continue Amlodipine-olmesartan 5-'20mg'$  -Will continue to monitor

## 2021-10-02 NOTE — Patient Instructions (Signed)
Thank you for seeing [place] today [name]! Your blood pressure today was 144/75. Today we talked about your diabetes, blood pressure, and right hand pain.   Diabetes Medication -Increase your Metformin to '1000mg'$  twice a day.  -Decrease your Tyler Aas to 5units. If you still have blood sugar lows, you can discontinue the insulin and call to let us know.  -Continue taking Jardiance '25mg'$ , Actos '15mg'$ , and Trulicity 0.'75mg'$ .  Right Hand Weakness/Pain We are putting in a referral to a hand surgeon and you should get a call from them to set up an appointment. We also put in a referral for occupational therapy to come help you with exercises at your home.   Labwork We are checking your cholesterol, thyroid function, blood count, and vitamin D levels today. We will call you with the results or you can check them on Mychart.   Health Maintenance Please call us in late September to check if the new COVID booster is available. We gave you a flu shot today in the office.   Please let us know if you have any questions!

## 2021-10-02 NOTE — Progress Notes (Cosign Needed)
    Subjective:   Patient ID: [name] female   DOB: 10/10/1950 71 y.o.   MRN: [id number]  HPI: [name] is a 71 y.o. with the history listed below who [facility] for follow-up of diabetes. Please see Plan for individualized problem-based charting.   Patient Active Problem List   Diagnosis Date Noted   Fall 08/02/2021   Advanced care planning/counseling discussion 05/29/2021   Urinary incontinence, mixed 06/16/2020   Vitamin D deficiency 08/17/2019   Weight loss 08/17/2019   Carpal tunnel syndrome 06/01/2019   Cognitive impairment 01/13/2019   Insomnia 06/11/2018   Hydronephrosis 04/01/2017   Osteoarthritis of facet joint of lumbar spine 02/04/2017   Sciatica of left side without back pain 01/30/2017   Neuropathy 12/09/2016   Genetic testing 11/27/2015   Primary osteoarthritis of first carpometacarpal joint of right hand 07/06/2015   Preventative health care 08/26/2012   HTN, goal below 140/90 08/13/2012   CKD (chronic kidney disease) stage 3, GFR 30-59 ml/min (HCC) 08/13/2012   Glaucoma associated with systemic syndromes(365.44) 08/07/2012   Hypothyroidism 04/16/2006   Anemia 04/16/2006   Depression 04/16/2006   BREAST CANCER, HX OF 04/16/2006   Hyperlipidemia 10/13/2003   Type 2 diabetes mellitus with diabetic polyneuropathy, with long-term current use of insulin (HCC) 04/16/1991     Current Outpatient Medications  Medication Sig Dispense Refill   Accu-Chek Softclix Lancets lancets Check blood sugar 5 times a day as instructed 200 each 10   ALPHAGAN P 0.1 % SOLN Place 1 drop into both eyes 3 (three) times daily.  2   amLODipine-olmesartan (AZOR) 5-20 MG tablet Take 1 tablet by mouth daily. 30 tablet 2   Besifloxacin HCl 0.6 % SUSP Besivance 0.6 % eye drops,suspension  INSTILL 1 DROP INTO THE LEFT EYE QID X 2 DAYS AFTER EACH MONTHLY EYE INJECTION     Blood Glucose Monitoring Suppl (ACCU-CHEK GUIDE ME) w/Device KIT Check blood sugar 5 times a day 1 kit  1   busPIRone (BUSPAR) 5 MG tablet Take 1 tablet (5 mg total) by mouth 2 (two) times daily. 180 tablet 3   Continuous Blood Gluc Sensor (DEXCOM G7 SENSOR) MISC Replace every 10 days 9 each 3   cycloSPORINE (RESTASIS) 0.05 % ophthalmic emulsion Restasis MultiDose 0.05 % eye drops  INT 1 GTT IN EACH EYE BID     empagliflozin (JARDIANCE) 25 MG TABS tablet Take 1 tablet (25 mg total) by mouth daily. 90 tablet 1   glucose 4 GM chewable tablet Chew 4 tablets (16 g total) by mouth as needed for low blood sugar. 50 tablet 12   glucose blood (ACCU-CHEK GUIDE) test strip Check blood sugar 5 times per day 150 each 11   glucose blood test strip OneTouch Verio test strips  USE TO CHECK BS BID     insulin degludec (TRESIBA) 100 UNIT/ML FlexTouch Pen Inject 13 Units into the skin at bedtime. (Patient taking differently: Inject 10 Units into the skin at bedtime.) 15 mL 1   Insulin Pen Needle (BD PEN NEEDLE NANO U/F) 32G X 4 MM MISC USE AS DIRECTED 100 each 6   ketorolac (ACULAR) 0.5 % ophthalmic solution Place 1 drop into both eyes 3 (three) times daily.  12   levothyroxine (SYNTHROID) 50 MCG tablet Take 1 tablet (50 mcg total) by mouth daily before breakfast. 90 tablet 3   metFORMIN (GLUCOPHAGE-XR) 500 MG 24 hr tablet Take 2 tablets by mouth in the morning and 1 tab by mouth in   the evening with meals 90 tablet 3   Nutritional Supplements (GLUCERNA SNACK SHAKE) LIQD Take 1 Dose by mouth 3 (three) times daily as needed. 30 Bottle 3   Olopatadine HCl 0.2 % SOLN Place 1 drop into both eyes daily as needed (dry eyes).      pioglitazone (ACTOS) 15 MG tablet Take 1 tablet (15 mg total) by mouth daily. 90 tablet 3   Polyethyl Glycol-Propyl Glycol (SYSTANE) 0.4-0.3 % GEL ophthalmic gel Place 1 application daily as needed into both eyes (dry eyes/irritation).      prednisoLONE acetate (PRED FORTE) 1 % ophthalmic suspension      rosuvastatin (CRESTOR) 20 MG tablet Take 1 tablet (20 mg total) by mouth daily. 90 tablet 3    TRULICITY 0.75 MG/0.5ML SOPN INJECT THE CONTENTS OF ONE PEN  SUBCUTANEOUSLY WEEKLY AS  DIRECTED 4 mL 5   venlafaxine XR (EFFEXOR-XR) 150 MG 24 hr capsule Take 1 capsule (150 mg total) by mouth daily with breakfast. 90 capsule 3   vitamin B-12 (CYANOCOBALAMIN) 500 MCG tablet Take 500 mcg by mouth daily.     No current facility-administered medications for this visit.     Review of Systems: Review of systems is negative other than what is noted in individual problem-based charting.    Objective:   Physical Exam: Vitals:   10/02/21 1011 10/02/21 1042  BP: (!) 155/64 (!) 144/75  Pulse: (!) 101 94  Temp: 100 F (37.8 C) 98.4 F (36.9 C)  TempSrc: Oral Oral  SpO2: 100%   Weight: 134 lb 9.6 oz (61.1 kg)   Height: 5' 2" (1.575 m)    Physical Exam Constitutional:      General: She is not in acute distress. HENT:     Head: Normocephalic and atraumatic.  Cardiovascular:     Rate and Rhythm: Normal rate and regular rhythm.     Heart sounds: Normal heart sounds. No murmur heard. Pulmonary:     Effort: Pulmonary effort is normal. No respiratory distress.     Breath sounds: Normal breath sounds. No wheezing.  Abdominal:     General: Abdomen is flat. Bowel sounds are normal. There is no distension.     Palpations: Abdomen is soft.     Tenderness: There is no abdominal tenderness. There is no guarding.  Musculoskeletal:     Comments: Decreased grip strength in R hand. +Tinel's sign on right side. Patient unable to do Phalen's test.  Skin:    General: Skin is warm and dry.  Neurological:     Mental Status: She is alert.      Assessment & Plan:   Type 2 diabetes mellitus with diabetic polyneuropathy, with long-term current use of insulin (HCC) Patient has chronic T2DM that is improving. Patient is on Jardiance, Metformin, Actos, Trulicity 0.75mg. Patient has been taking Tresiba 8U, dose was adjusted down from 13U, then 10U after patient's son called [facility] with concern of overnight  hypoglycemic episodes to the 60's. Patient's Dexcom shows average glucose of 186 and 50% of time in range. 1% of readings were lows, no very lows. Blood sugar peaks between 9AM-1PM and 9PM-1AM. Patient's son reports that when her nighttime glucose is >200, they give her Tresiba 8U but this has given her lows and they give her glucose to bring it back up. Patient denies nausea, vomiting, or bowel movement changes. Denies recent falls.  A&P: Patient's Tresiba has been titrated down over the last few visits and her glucose control has overall remained stable. This   may be related to patient's aide who is helping with medication adherence. As patient has been tolerating Metformin well with no GI side effects, will plan to increase dose. We discussed with patient to decrease Tresiba to 5U but advised her to discontinue if she continues to have recurrent hypoglycemia. Patient's weight has remained stable in the mid-130's, and can consider increasing Trulicity to 1.5 in the future.  Plan: -Increase Metformin to 1033m BID -Decrease Tresiba to 5U -Continue taking Jardiance 26m Actos 1538GTTrulicity 0.3.64WO Neuropathy Patient has chronic and persistent weakness and tingling in her right hand. Patient endorses that she still uses her left hand for daily tasks due to discomfort in the right hand. Patient also reports she has noticed "cysts" in her right wrist in the past, but does not have one currently.  On exam, patient has decreased grip strength and positive Tinel's sign in right hand. Patient was unable to position her hands to do Phalen's test.  A&P: Patient's persistent weakness and tingling despite history of carpal tunnel surgery is likely recurrence of the carpal tunnel. Patient may benefit from occupational therapy for strengthening and mobility of her hand, and will also refer to hand surgery for further evaluation. Plan: -Referral to hand surgery  -Referral for home occupational therapy  HTN,  goal below 140/90 Patient has chronic stable hypertension, managed with amlodipine-olmesartan. Patient denies any dizziness or recent falls. BP today was initially 155/64 and 144/75 on retake. Plan: -Continue Amlodipine-olmesartan 5-2039mWill continue to monitor  Fall We discussed today that patient does not meet criteria for insurance coverage for hospital bed such as being bed-bound or increased aspiration risk, and patient expressed understanding. Patient has been ambulating well with cane and does not report any issues, no recent falls.   Vitamin D deficiency -Check vitamin D level this visit  Hyperlipidemia -Patient taking rosuvastatin 90m55mheck lipid panel this visit  Hypothyroidism -Patient has chronic stable hypothyroidism taking synthroid 50mc66mheck TSH this visit  Weight loss Patient's weight has remained stable in mid-130s. Patient is currently taking Trulicity 0.31m0.32ZYreports good appetite and has not lost weight since starting this medication. Patient still has aide to help with medications and meals Mon-Sat. Will continue to follow.   Preventative health care -Patient had yearly ophthalmology exam with Dr. GroatKaty Fitch week -Patient advised to call for availability of COVID booster at end of month -Flu shot given today

## 2021-10-02 NOTE — Assessment & Plan Note (Signed)
Patient has chronic and persistent weakness and tingling in her right hand. Patient endorses that she still uses her left hand for daily tasks due to discomfort in the right hand. Patient also reports she has noticed "cysts" in her right wrist in the past, but does not have one currently.  On exam, patient has decreased grip strength and positive Tinel's sign in right hand. Patient was unable to position her hands to do Phalen's test.  A&P: Patient's persistent weakness and tingling despite history of carpal tunnel surgery is likely recurrence of the carpal tunnel. Patient may benefit from occupational therapy for strengthening and mobility of her hand, and will also refer to hand surgery for further evaluation. Plan: -Referral to hand surgery  -Referral for home occupational therapy

## 2021-10-02 NOTE — Assessment & Plan Note (Signed)
-  Patient had yearly ophthalmology exam with Dr. Katy Fitch last week -Patient advised to call for availability of COVID booster at end of month -Flu shot given today

## 2021-10-02 NOTE — Assessment & Plan Note (Signed)
-  Check vitamin D level this visit

## 2021-10-02 NOTE — Assessment & Plan Note (Addendum)
-  Patient taking rosuvastatin '20mg'$  -Check lipid panel this visit

## 2021-10-02 NOTE — Assessment & Plan Note (Addendum)
Patient has chronic T2DM that is improving. Patient is on Jardiance, Metformin, Actos, Trulicity 0.'75mg'$ . Patient has been taking [place] 8U, dose was adjusted down from 13U, then 10U after patient's son called [facility] with concern of overnight hypoglycemic episodes to the 60's. Patient's Dexcom shows average glucose of 186 and 50% of time in range. 1% of readings were lows, no very lows. Blood sugar peaks between 9AM-1PM and 9PM-1AM. Patient's son reports that when her nighttime glucose is >200, they give her Cameron Ali but this has given her lows and they give her glucose to bring it back up. Patient denies nausea, vomiting, or bowel movement changes. Denies recent falls.  A&P: Patient's Tyler Aas has been titrated down over the last few visits and her glucose control has overall remained stable. This may be related to patient's aide who is helping with medication adherence. As patient has been tolerating Metformin well with no GI side effects, will plan to increase dose. We discussed with patient to decrease Tresiba to 5U but advised her to discontinue if she continues to have recurrent hypoglycemia. Patient's weight has remained stable in the mid-130's, and can consider increasing Trulicity to 1.5 in the future.  Plan: -Increase Metformin to '1000mg'$  BID -Decrease Tresiba to 5U -Continue taking Jardiance '25mg'$ , Actos '15mg'$ , Trulicity 0.'75mg'$ 

## 2021-10-02 NOTE — Assessment & Plan Note (Addendum)
We discussed today that patient does not meet criteria for insurance coverage for hospital bed such as being bed-bound or increased aspiration risk, and patient expressed understanding. Patient has been ambulating well with cane and does not report any issues, no recent falls.

## 2021-10-02 NOTE — Assessment & Plan Note (Signed)
Patient's weight has remained stable in mid-130s. Patient is currently taking Trulicity 0.'75mg'$  and reports good appetite and has not lost weight since starting this medication. Patient still has aide to help with medications and meals Mon-Sat. Will continue to follow.

## 2021-10-02 NOTE — Assessment & Plan Note (Signed)
-  Patient has chronic stable hypothyroidism taking synthroid 70mg -Check TSH this visit

## 2021-10-03 ENCOUNTER — Other Ambulatory Visit: Payer: Self-pay

## 2021-10-03 DIAGNOSIS — E039 Hypothyroidism, unspecified: Secondary | ICD-10-CM

## 2021-10-03 DIAGNOSIS — I10 Essential (primary) hypertension: Secondary | ICD-10-CM

## 2021-10-03 LAB — CBC
Hematocrit: 31.5 % — ABNORMAL LOW (ref 34.0–46.6)
Hemoglobin: 10.5 g/dL — ABNORMAL LOW (ref 11.1–15.9)
MCH: 30.5 pg (ref 26.6–33.0)
MCHC: 33.3 g/dL (ref 31.5–35.7)
MCV: 92 fL (ref 79–97)
Platelets: 243 10*3/uL (ref 150–450)
RBC: 3.44 x10E6/uL — ABNORMAL LOW (ref 3.77–5.28)
RDW: 13.3 % (ref 11.7–15.4)
WBC: 7.5 10*3/uL (ref 3.4–10.8)

## 2021-10-03 LAB — VITAMIN D 25 HYDROXY (VIT D DEFICIENCY, FRACTURES): Vit D, 25-Hydroxy: 56.6 ng/mL (ref 30.0–100.0)

## 2021-10-03 LAB — LIPID PANEL
Chol/HDL Ratio: 2.2 ratio (ref 0.0–4.4)
Cholesterol, Total: 106 mg/dL (ref 100–199)
HDL: 48 mg/dL (ref >39)
LDL Chol Calc (NIH): 45 mg/dL (ref 0–99)
Triglycerides: 54 mg/dL (ref 0–149)
VLDL Cholesterol Cal: 13 mg/dL (ref 5–40)

## 2021-10-03 LAB — TSH: TSH: 6.69 u[IU]/mL — ABNORMAL HIGH (ref 0.450–4.500)

## 2021-10-03 MED ORDER — LEVOTHYROXINE SODIUM 75 MCG PO TABS: 75.0000 ug | ORAL_TABLET | Freq: Every day | ORAL | 3 refills | Status: DC

## 2021-10-03 MED ORDER — AMLODIPINE-OLMESARTAN 5-20 MG PO TABS: 1.0000 | ORAL_TABLET | Freq: Every day | ORAL | 2 refills | Status: DC

## 2021-10-03 NOTE — Progress Notes (Signed)
Attestation for Student Documentation:  I personally was present and performed or re-performed the history, physical exam and medical decision-making activities of this service and have verified that the service and findings are accurately documented in the student's note.  Aldine Contes, MD 10/03/2021, 10:50 AM

## 2021-10-15 ENCOUNTER — Encounter: Payer: Self-pay | Admitting: *Deleted

## 2021-10-15 NOTE — Progress Notes (Signed)
[facility] Quality Team Note  Name: [name] Date of Birth: 1950/03/29 MRN: [id number] Date: 10/15/2021  [facility] Quality Team has reviewed this patient's chart, please see recommendations below:  [facility] Quality Other; (Pt has open gap for A1C.  Can you see if pt can have done at lab appt on 10/30/2021?)

## 2021-10-16 ENCOUNTER — Other Ambulatory Visit: Payer: Self-pay

## 2021-10-16 ENCOUNTER — Ambulatory Visit (INDEPENDENT_AMBULATORY_CARE_PROVIDER_SITE_OTHER): Payer: 59

## 2021-10-16 ENCOUNTER — Encounter: Payer: Self-pay | Admitting: Orthopaedic Surgery

## 2021-10-16 ENCOUNTER — Ambulatory Visit (INDEPENDENT_AMBULATORY_CARE_PROVIDER_SITE_OTHER): Payer: 59 | Admitting: Orthopaedic Surgery

## 2021-10-16 DIAGNOSIS — E1142 Type 2 diabetes mellitus with diabetic polyneuropathy: Secondary | ICD-10-CM

## 2021-10-16 DIAGNOSIS — G5601 Carpal tunnel syndrome, right upper limb: Secondary | ICD-10-CM

## 2021-10-16 MED ORDER — TRULICITY 0.75 MG/0.5ML ~~LOC~~ SOAJ: SUBCUTANEOUS | 5 refills | Status: DC

## 2021-10-16 NOTE — Progress Notes (Signed)
Office Visit Note   Patient: [name]           Date of Birth: 10-24-1950           MRN: [id number] Visit Date: 10/16/2021              Requested by: Aldine Contes, MD [address], [place],  [place] [id number] PCP: Aldine Contes, MD   Assessment & Plan: Visit Diagnoses:  1. Right carpal tunnel syndrome     Plan: Impression is right carpal tunnel syndrome.  Treatment options were explained.  She has tried bracing in the past but is no longer effective.  Not really interested in cortisone injections.  She is leaning toward surgery therefore we will obtain an A1c to make sure that this is within acceptable parameters for surgery.  We will contact the patient to schedule surgery if the A1c is acceptable.  Follow-Up Instructions: No follow-ups on file.   Orders:  No orders of the defined types were placed in this encounter.  No orders of the defined types were placed in this encounter.     Procedures: No procedures performed   Clinical Data: No additional findings.   Subjective: Chief Complaint  Patient presents with   Right Wrist - Pain    HPI Patient is a very pleasant 71 year old female here with her son for evaluation of right hand numbness and tingling with diagnosis of carpal tunnel syndrome for years.  She feels that she is losing strength and dropping things from her hand.  She has numbness as well.  Decreased sensation in the fingertips.  Review of Systems  Constitutional: Negative.   HENT: Negative.    Eyes: Negative.   Respiratory: Negative.    Cardiovascular: Negative.   Endocrine: Negative.   Musculoskeletal: Negative.   Neurological: Negative.   Hematological: Negative.   Psychiatric/Behavioral: Negative.    All other systems reviewed and are negative.    Objective: Vital Signs: There were no vitals taken for this visit.  Physical Exam Vitals and nursing note reviewed.  Constitutional:      Appearance: She is  well-developed.  HENT:     Head: Atraumatic.     Nose: Nose normal.  Eyes:     Extraocular Movements: Extraocular movements intact.  Cardiovascular:     Pulses: Normal pulses.  Pulmonary:     Effort: Pulmonary effort is normal.  Abdominal:     Palpations: Abdomen is soft.  Musculoskeletal:     Cervical back: Neck supple.  Skin:    General: Skin is warm.     Capillary Refill: Capillary refill takes less than 2 seconds.  Neurological:     Mental Status: She is alert. Mental status is at baseline.  Psychiatric:        Behavior: Behavior normal.        Thought Content: Thought content normal.        Judgment: Judgment normal.     Ortho Exam Examination of the right hand shows thenar flattening.  Decreased sensation to the tip of the thumb.  Normal capillary refill.  She can make a full composite fist.  Specialty Comments:  No specialty comments available.  Imaging: No results found.   PMFS History: Patient Active Problem List   Diagnosis Date Noted   Right carpal tunnel syndrome 10/16/2021   Fall 08/02/2021   Advanced care planning/counseling discussion 05/29/2021   Urinary incontinence, mixed 06/16/2020   Vitamin D deficiency 08/17/2019   Weight loss 08/17/2019  Carpal tunnel syndrome 06/01/2019   Cognitive impairment 01/13/2019   Insomnia 06/11/2018   Hydronephrosis 04/01/2017   Osteoarthritis of facet joint of lumbar spine 02/04/2017   Sciatica of left side without back pain 01/30/2017   Neuropathy 12/09/2016   Genetic testing 11/27/2015   Primary osteoarthritis of first carpometacarpal joint of right hand 07/06/2015   Preventative health care 08/26/2012   HTN, goal below 140/90 08/13/2012   CKD (chronic kidney disease) stage 3, GFR 30-59 ml/min (HCC) 08/13/2012   Glaucoma associated with systemic syndromes(365.44) 08/07/2012   Hypothyroidism 04/16/2006   Anemia 04/16/2006   Depression 04/16/2006   BREAST CANCER, HX OF 04/16/2006   Hyperlipidemia  10/13/2003   Type 2 diabetes mellitus with diabetic polyneuropathy, with long-term current use of insulin ([place]) 04/16/1991   Past Medical History:  Diagnosis Date   Arthritis    Bilateral hip pain 02/24/2015   Borderline glaucoma of both eyes    Depression    Diabetic polyneuropathy ([place])    Diverticulosis of colon    Dizziness 12/05/2017   Dry eyes, bilateral    Feeling of incomplete bladder emptying    Frequent falls 01/08/2018   Gait abnormality 07/19/2021   History of breast cancer oncologist-  dr Waymon Budge-- per lov note no recurrence   dx 04/ 1999 --- Stage 3B-- s/p  chemotherapy then right mastectomy then concurrent chemoradiation therapy   History of urinary retention    01/ 2018   Hydronephrosis of right kidney    Hyperlipidemia    Hypertension    Hypokalemia 12/09/2016   Hypomagnesemia 12/09/2016   Hypothyroidism    Insulin dependent type 2 diabetes mellitus [facility])    endocrinologist-  dr Cruzita Lederer---  last A1c 8.7 on 02-20-2016   Left leg pain 03/15/2019   LOW BACK PAIN 04/16/2006   Qualifier: Diagnosis of  By: Leward Quan MD, Pamala Hurry     Lymphedema of upper extremity    right   Macular degeneration, left eye    followed by dr Zigmund Daniel   Renal insufficiency    Seizure-like activity ([place]) 04/17/2019   Urgency of urination    Visual hallucinations 01/12/2019    Family History  Problem Relation Age of Onset   Heart disease Father    Diabetes Father    Stroke Sister    Anuerysm Sister 87       brain; maternal half-sister   Heart disease Brother    Anuerysm Brother 39       aortic; maternal half-brother   Breast cancer Daughter 49       negative genetic testing in 2015   Heart attack Daughter        46-47   Diabetes Paternal Grandmother    Stroke Paternal Grandfather    Anuerysm Brother        NOS type; full brother   Fibroids Daughter        s/p hysterectomy at 79y   Brain cancer Maternal Uncle        dx. older than 26; NOS type   Brain cancer Cousin         maternal 1st cousin; d. early 65s; NOS type   Deafness Paternal Uncle        prelingual    Past Surgical History:  Procedure Laterality Date   CARPAL TUNNEL RELEASE Right 2017   and tendon repair   CATARACT EXTRACTION W/ INTRAOCULAR LENS  IMPLANT, BILATERAL  2017   CYSTO/  RIGHT RETROGRADE PYELOGRAM/  UNROOFING RIGHT URETEROCELE  09/18/2000  CYSTOSCOPY/RETROGRADE/URETEROSCOPY Right 05/09/2016   Procedure: CYSTOSCOPY and right RETROGRADE;  Surgeon: Irine Seal, MD;  Location: [facility];  Service: Urology;  Laterality: Right;   FINGER SURGERY Left x2 prior to 09-09-2014   I & D EXTREMITY Left 09/09/2014   Procedure: IRRIGATION AND DEBRIDEMENT LEFT THUMB DISTAL PHALANX;  Surgeon: Daryll Brod, MD;  Location: [place];  Service: Orthopedics;  Laterality: Left;   MASTECTOMY Right 1999   w/  Node dissection's   PORT-A-CATH REMOVAL  05/01/1999   TUBAL LIGATION     VAGINAL HYSTERECTOMY  1970's   WRIST GANGLION EXCISION Right 2016   Social History   Occupational History   Not on file  Tobacco Use   Smoking status: Never   Smokeless tobacco: Never  Vaping Use   Vaping Use: Never used  Substance and Sexual Activity   Alcohol use: No    Alcohol/week: 0.0 standard drinks of alcohol   Drug use: No   Sexual activity: Not on file

## 2021-10-17 LAB — HEMOGLOBIN A1C
Hgb A1c MFr Bld: 7.8 % of total Hgb — ABNORMAL HIGH (ref <5.7)
Mean Plasma Glucose: 177 mg/dL
eAG (mmol/L): 9.8 mmol/L

## 2021-10-22 ENCOUNTER — Other Ambulatory Visit: Payer: Self-pay | Admitting: Internal Medicine

## 2021-10-22 DIAGNOSIS — E1142 Type 2 diabetes mellitus with diabetic polyneuropathy: Secondary | ICD-10-CM

## 2021-10-22 NOTE — Telephone Encounter (Signed)
Next appt scheduled 10/3 with PCP.

## 2021-10-30 ENCOUNTER — Ambulatory Visit (INDEPENDENT_AMBULATORY_CARE_PROVIDER_SITE_OTHER): Payer: 59 | Admitting: Internal Medicine

## 2021-10-30 VITALS — BP 137/63 | HR 92 | Temp 98.2°F | Ht 62.0 in | Wt 129.7 lb

## 2021-10-30 DIAGNOSIS — I129 Hypertensive chronic kidney disease with stage 1 through stage 4 chronic kidney disease, or unspecified chronic kidney disease: Secondary | ICD-10-CM | POA: Diagnosis not present

## 2021-10-30 DIAGNOSIS — Z794 Long term (current) use of insulin: Secondary | ICD-10-CM

## 2021-10-30 DIAGNOSIS — E1142 Type 2 diabetes mellitus with diabetic polyneuropathy: Secondary | ICD-10-CM | POA: Diagnosis not present

## 2021-10-30 DIAGNOSIS — E1122 Type 2 diabetes mellitus with diabetic chronic kidney disease: Secondary | ICD-10-CM

## 2021-10-30 DIAGNOSIS — E039 Hypothyroidism, unspecified: Secondary | ICD-10-CM

## 2021-10-30 DIAGNOSIS — Z0000 Encounter for general adult medical examination without abnormal findings: Secondary | ICD-10-CM

## 2021-10-30 DIAGNOSIS — D649 Anemia, unspecified: Secondary | ICD-10-CM

## 2021-10-30 DIAGNOSIS — R634 Abnormal weight loss: Secondary | ICD-10-CM

## 2021-10-30 DIAGNOSIS — N183 Chronic kidney disease, stage 3 unspecified: Secondary | ICD-10-CM

## 2021-10-30 DIAGNOSIS — I10 Essential (primary) hypertension: Secondary | ICD-10-CM

## 2021-10-30 DIAGNOSIS — G5601 Carpal tunnel syndrome, right upper limb: Secondary | ICD-10-CM

## 2021-10-30 NOTE — Assessment & Plan Note (Signed)
-  This problem is chronic and stable -Patient's weight is stable 129 pounds today -We will continue to monitor closely

## 2021-10-30 NOTE — Progress Notes (Signed)
   Subjective:    Patient ID: [name], female    DOB: 1951/01/16, 71 y.o.   MRN: [id number]  HPI  I have seen and examined this patient.  Patient is here for routine follow-up of her diabetes and hypertension.  Patient was noted to have high TSH at her last visit and her Synthroid was increased to 75 mcg.  She denies any symptoms of hypothyroidism currently.  She states that she is compliant with her medications and denies any new complaints at this time.   Review of Systems  Constitutional: Negative.   HENT: Negative.    Respiratory: Negative.    Cardiovascular: Negative.   Gastrointestinal: Negative.   Musculoskeletal: Negative.   Neurological: Negative.   Psychiatric/Behavioral: Negative.        Objective:   Physical Exam Constitutional:      Appearance: Normal appearance.  HENT:     Head: Normocephalic and atraumatic.     Mouth/Throat:     Mouth: Mucous membranes are moist.     Pharynx: Oropharynx is clear. No oropharyngeal exudate or posterior oropharyngeal erythema.  Cardiovascular:     Rate and Rhythm: Normal rate and regular rhythm.     Heart sounds: Normal heart sounds.  Pulmonary:     Effort: Pulmonary effort is normal. No respiratory distress.     Breath sounds: Normal breath sounds. No wheezing.  Abdominal:     General: Bowel sounds are normal. There is no distension.     Palpations: Abdomen is soft.     Tenderness: There is no abdominal tenderness.  Musculoskeletal:        General: No swelling or tenderness.     Cervical back: Neck supple.  Lymphadenopathy:     Cervical: No cervical adenopathy.  Neurological:     General: No focal deficit present.     Mental Status: She is alert and oriented to person, place, and time.  Psychiatric:        Mood and Affect: Mood normal.        Behavior: Behavior normal.         Assessment & Plan:  Please see problem based charting for assessment and plan:

## 2021-10-30 NOTE — Assessment & Plan Note (Signed)
BP Readings from Last 3 Encounters:  10/30/21 137/63  10/02/21 (!) 144/75  08/28/21 137/82    Lab Results  Component Value Date   NA 140 07/21/2021   K 3.9 07/21/2021   CREATININE 1.21 (H) 07/21/2021    Assessment: Blood pressure control:  Well-controlled Progress toward BP goal:   At goal Comments: Patient is compliant with amlodipine/olmesartan 5/20 mg daily  Plan: Medications:  continue current medications Educational resources provided:   Self management tools provided:   Other plans: We will check BMP at next visit

## 2021-10-30 NOTE — Assessment & Plan Note (Signed)
-  This problem is chronic and stable. -Patient noted to have mild anemia with hemoglobin in the tens at her last visit -We will repeat this today -Patient has no evidence of bleeding and I suspect that this is likely anemia of chronic disease from her CKD -No further work-up at this time

## 2021-10-30 NOTE — Assessment & Plan Note (Signed)
-  This problem is chronic and stable -Patient's last BMP had a stable creatinine of 1.2 -Patient has no urinary complaints at this time -She will need a BMP at her next office visit

## 2021-10-30 NOTE — Assessment & Plan Note (Signed)
-  This problem is chronic and stable -Patient was noted to have 1 low blood sugar by her son in the 64s but otherwise her blood sugars have been within range over 50% of the time and high 30% of the time with only 13% very high -Patient has her Tyler Aas secondary to episodes of hypoglycemia -Her last A1c was 7.8 -Her goal A1c should be less than 8 given her age and comorbidities and episodes of hypoglycemia -We will continue with metformin 1000 mg twice a day, Jardiance 25 mg daily as well as Actos 15 mg daily -No further work-up at this time

## 2021-10-30 NOTE — Assessment & Plan Note (Signed)
-  This problem is chronic and stable -Patient was noted to have an elevated TSH at her last visit and had her Synthroid increased to 75 mcg daily -Patient denies any symptoms of hypo or hyperthyroidism currently and her weight is stable -We will recheck her TSH today -No further work-up at this time

## 2021-10-30 NOTE — Assessment & Plan Note (Signed)
-  Talked to the patient about getting her shingles vaccine.  Patient and son state that they will get that at the pharmacy she used to.

## 2021-10-30 NOTE — Patient Instructions (Addendum)
-  It was a pleasure seeing you today -Your blood sugars are doing better.  You do have some high sugars during the day but I would like to monitor this for now on your current medications.  Your goal A1c should be less than 8 -Your blood pressure is doing better today.  Continue with your current medications for now -We will check some blood work on you today including your thyroid function and blood counts.  I will give you a call with results -Please take your shingles vaccine at your pharmacy -Please follow-up with Ortho for your carpal tunnel syndrome -Please call me with any questions or concerns or if you need any refills

## 2021-10-30 NOTE — Assessment & Plan Note (Signed)
-  This problem is chronic and stable -Patient has chronic and persistent weakness and tingling in her right hand and likely has recurrent carpal tunnel syndrome -She was referred to orthopedics who recommended surgery for her recurrent carpal tunnel -She is awaiting callback from them to get her surgery scheduled -No further work-up at this time

## 2021-10-31 LAB — CBC WITH DIFFERENTIAL/PLATELET
Basophils Absolute: 0 10*3/uL (ref 0.0–0.2)
Basos: 0 %
EOS (ABSOLUTE): 0.1 10*3/uL (ref 0.0–0.4)
Eos: 1 %
Hematocrit: 32 % — ABNORMAL LOW (ref 34.0–46.6)
Hemoglobin: 10.7 g/dL — ABNORMAL LOW (ref 11.1–15.9)
Immature Grans (Abs): 0 10*3/uL (ref 0.0–0.1)
Immature Granulocytes: 0 %
Lymphocytes Absolute: 1.4 10*3/uL (ref 0.7–3.1)
Lymphs: 20 %
MCH: 30.5 pg (ref 26.6–33.0)
MCHC: 33.4 g/dL (ref 31.5–35.7)
MCV: 91 fL (ref 79–97)
Monocytes Absolute: 0.4 10*3/uL (ref 0.1–0.9)
Monocytes: 5 %
Neutrophils Absolute: 5.4 10*3/uL (ref 1.4–7.0)
Neutrophils: 74 %
Platelets: 227 10*3/uL (ref 150–450)
RBC: 3.51 x10E6/uL — ABNORMAL LOW (ref 3.77–5.28)
RDW: 14 % (ref 11.7–15.4)
WBC: 7.3 10*3/uL (ref 3.4–10.8)

## 2021-10-31 LAB — T4, FREE: Free T4: 1.45 ng/dL (ref 0.82–1.77)

## 2021-10-31 LAB — TSH: TSH: 0.575 u[IU]/mL (ref 0.450–4.500)

## 2021-11-01 ENCOUNTER — Other Ambulatory Visit: Payer: Self-pay | Admitting: Internal Medicine

## 2021-11-01 DIAGNOSIS — G5601 Carpal tunnel syndrome, right upper limb: Secondary | ICD-10-CM

## 2021-11-14 ENCOUNTER — Ambulatory Visit (HOSPITAL_BASED_OUTPATIENT_CLINIC_OR_DEPARTMENT_OTHER): Admission: RE | Admit: 2021-11-14 | Payer: 59 | Source: Ambulatory Visit | Admitting: Orthopaedic Surgery

## 2021-11-14 ENCOUNTER — Encounter (HOSPITAL_BASED_OUTPATIENT_CLINIC_OR_DEPARTMENT_OTHER): Admission: RE | Payer: Self-pay | Source: Ambulatory Visit

## 2021-11-14 SURGERY — CARPAL TUNNEL RELEASE
Anesthesia: Monitor Anesthesia Care | Site: Wrist | Laterality: Right

## 2021-12-03 ENCOUNTER — Other Ambulatory Visit: Payer: Self-pay | Admitting: Student

## 2021-12-03 DIAGNOSIS — F32A Depression, unspecified: Secondary | ICD-10-CM

## 2021-12-04 ENCOUNTER — Other Ambulatory Visit: Payer: Self-pay | Admitting: Internal Medicine

## 2021-12-04 DIAGNOSIS — I10 Essential (primary) hypertension: Secondary | ICD-10-CM

## 2021-12-04 NOTE — Telephone Encounter (Signed)
Next appt scheduled 01/15/22 with PCP.

## 2021-12-17 ENCOUNTER — Other Ambulatory Visit: Payer: Self-pay | Admitting: Internal Medicine

## 2021-12-17 DIAGNOSIS — Z794 Long term (current) use of insulin: Secondary | ICD-10-CM

## 2021-12-17 NOTE — Telephone Encounter (Signed)
Next appt scheduled 01/15/22 with PCP.

## 2022-01-10 ENCOUNTER — Other Ambulatory Visit: Payer: Self-pay | Admitting: Internal Medicine

## 2022-01-10 DIAGNOSIS — I10 Essential (primary) hypertension: Secondary | ICD-10-CM

## 2022-01-14 ENCOUNTER — Encounter: Payer: Self-pay | Admitting: Internal Medicine

## 2022-01-15 ENCOUNTER — Ambulatory Visit (INDEPENDENT_AMBULATORY_CARE_PROVIDER_SITE_OTHER): Payer: 59 | Admitting: Internal Medicine

## 2022-01-15 VITALS — BP 133/67 | HR 90 | Temp 98.4°F | Ht 62.0 in | Wt 124.2 lb

## 2022-01-15 DIAGNOSIS — E785 Hyperlipidemia, unspecified: Secondary | ICD-10-CM

## 2022-01-15 DIAGNOSIS — E1142 Type 2 diabetes mellitus with diabetic polyneuropathy: Secondary | ICD-10-CM

## 2022-01-15 DIAGNOSIS — I129 Hypertensive chronic kidney disease with stage 1 through stage 4 chronic kidney disease, or unspecified chronic kidney disease: Secondary | ICD-10-CM

## 2022-01-15 DIAGNOSIS — R634 Abnormal weight loss: Secondary | ICD-10-CM

## 2022-01-15 DIAGNOSIS — G5601 Carpal tunnel syndrome, right upper limb: Secondary | ICD-10-CM | POA: Diagnosis not present

## 2022-01-15 DIAGNOSIS — N183 Chronic kidney disease, stage 3 unspecified: Secondary | ICD-10-CM

## 2022-01-15 DIAGNOSIS — N1330 Unspecified hydronephrosis: Secondary | ICD-10-CM

## 2022-01-15 DIAGNOSIS — E559 Vitamin D deficiency, unspecified: Secondary | ICD-10-CM

## 2022-01-15 DIAGNOSIS — E039 Hypothyroidism, unspecified: Secondary | ICD-10-CM | POA: Diagnosis not present

## 2022-01-15 DIAGNOSIS — Z7984 Long term (current) use of oral hypoglycemic drugs: Secondary | ICD-10-CM

## 2022-01-15 DIAGNOSIS — Z794 Long term (current) use of insulin: Secondary | ICD-10-CM | POA: Diagnosis not present

## 2022-01-15 DIAGNOSIS — Z0000 Encounter for general adult medical examination without abnormal findings: Secondary | ICD-10-CM

## 2022-01-15 DIAGNOSIS — I10 Essential (primary) hypertension: Secondary | ICD-10-CM

## 2022-01-15 DIAGNOSIS — F32A Depression, unspecified: Secondary | ICD-10-CM

## 2022-01-15 LAB — POCT GLYCOSYLATED HEMOGLOBIN (HGB A1C): Hemoglobin A1C: 8 % — AB (ref 4.0–5.6)

## 2022-01-15 LAB — BASIC METABOLIC PANEL
Anion gap: 9 (ref 5–15)
BUN: 21 mg/dL (ref 8–23)
CO2: 25 mmol/L (ref 22–32)
Calcium: 9.7 mg/dL (ref 8.9–10.3)
Chloride: 104 mmol/L (ref 98–111)
Creatinine, Ser: 0.88 mg/dL (ref 0.44–1.00)
GFR, Estimated: >60 mL/min (ref >60)
Glucose, Bld: 161 mg/dL — ABNORMAL HIGH (ref 70–99)
Potassium: 3.8 mmol/L (ref 3.5–5.1)
Sodium: 138 mmol/L (ref 135–145)

## 2022-01-15 LAB — GLUCOSE, CAPILLARY: Glucose-Capillary: 152 mg/dL — ABNORMAL HIGH (ref 70–99)

## 2022-01-15 NOTE — Addendum Note (Signed)
Addended by: Truddie Crumble on: 01/15/2022 11:48 AM   Modules accepted: Orders

## 2022-01-15 NOTE — Assessment & Plan Note (Signed)
-  This problem is chronic and stable -We will recheck a PHQ-9 today -Patient is compliant with Effexor 150 mg daily as well as BuSpar 5 mg twice daily

## 2022-01-15 NOTE — Assessment & Plan Note (Signed)
-  This problem is chronic and stable -We will recheck a BMP today -Patient denies any urinary complaints at this time

## 2022-01-15 NOTE — Assessment & Plan Note (Signed)
BP Readings from Last 3 Encounters:  01/15/22 133/67  10/30/21 137/63  10/02/21 (!) 144/75    Lab Results  Component Value Date   NA 140 07/21/2021   K 3.9 07/21/2021   CREATININE 1.21 (H) 07/21/2021    Assessment: Blood pressure control:  Well-controlled Progress toward BP goal:   At goal Comments: Patient is compliant with amlodipine/olmesartan 5/20 mg daily  Plan: Medications:  continue current medications Educational resources provided:   Self management tools provided:   Other plans: We will check BMP today

## 2022-01-15 NOTE — Assessment & Plan Note (Addendum)
-  This problem is chronic and stable -Vitamin D level was checked last visit and is at goal

## 2022-01-15 NOTE — Progress Notes (Signed)
   Subjective:    Patient ID: [name], female    DOB: [birth year], 71 y.o.   MRN: [id number]  Diabetes  Medication Refill    I have seen and examined this patient.  Patient is here for follow-up of her hypertension and diabetes.  Patient states that she feels well today and denies any new complaints.  She is accompanied by her son who states that patient does have meals where she does not eat as well but overall seems to be doing well.  Patient states that she is compliant with all her medications.  Review of Systems  Constitutional:        Patient was noted to be down approximately 10 pounds over the last 3-4 months  HENT: Negative.    Respiratory: Negative.    Cardiovascular: Negative.   Gastrointestinal: Negative.   Musculoskeletal: Negative.   Neurological: Negative.   Psychiatric/Behavioral: Negative.         Objective:   Physical Exam Constitutional:      Appearance: Normal appearance.  HENT:     Head: Normocephalic and atraumatic.  Cardiovascular:     Rate and Rhythm: Normal rate and regular rhythm.     Heart sounds: Normal heart sounds.  Pulmonary:     Breath sounds: Normal breath sounds. No wheezing or rales.  Abdominal:     General: Bowel sounds are normal. There is no distension.     Palpations: Abdomen is soft.     Tenderness: There is no abdominal tenderness.  Musculoskeletal:        General: No swelling or tenderness.  Neurological:     Mental Status: She is alert. Mental status is at baseline.  Psychiatric:        Mood and Affect: Mood normal.        Behavior: Behavior normal.           Assessment & Plan:   Please see problem based charting for assessment and plan:

## 2022-01-15 NOTE — Patient Instructions (Addendum)
-  Was a pleasure seeing you again.  Have a wonderful holiday season! -Your blood pressure is well-controlled.  Keep up the great work! -Your diabetes is currently at goal.  I am a little concerned about your weight loss.  If your weight continues to decrease I would consider stopping the Trulicity -Please follow-up with Dr. Erlinda Hong for your carpal tunnel surgery (phone number is [phone]) -I have put in a referral to urology for you for follow-up of your history of hydronephrosis.  It was stable when you saw them last year. -We will do a foot exam on you today -We will check some blood work for you -Please call me if you have any questions or concerns or if you need any refills -New PCP in January is going to be Dr. Lottie Mussel. -It has been a pleasure taking care of you all these years

## 2022-01-15 NOTE — Assessment & Plan Note (Addendum)
-  This problem is chronic and stable -Lipid panel done on last visit showed LDL at goal -No further workup at this time -Continue with rosuvastatin 20 mg daily

## 2022-01-15 NOTE — Assessment & Plan Note (Signed)
-  This problem is chronic and worsening -Patient is persistent weakness and tingling numbness in her right hand likely secondary to recurrent carpal tunnel syndrome -Ortho recommended surgery and had this scheduled but patient was unable to make that appointment -I have given them the number for the orthopedic doctor (Dr. Erlinda Hong) to call and reschedule surgery -No further workup at this time

## 2022-01-15 NOTE — Assessment & Plan Note (Signed)
-  This problem is chronic and stable -Patient is compliant with Synthroid 75 mcg daily -Patient's TSH and free T4 were within normal limits in October -Given patient's age may try and aim for a higher TSH level.  If patient's weight continues to decrease would consider going down on her Synthroid to 50 mcg daily -No further workup at this time

## 2022-01-15 NOTE — Assessment & Plan Note (Signed)
-  Patient states that she followed up with the eye doctor (Dr. Katy Fitch) recently.  Will follow-up eye exam results -Patient still has to get her shingles vaccine.  We discussed this at her last visit.  Will follow-up next visit to see if she got this done

## 2022-01-15 NOTE — Assessment & Plan Note (Addendum)
Lab Results  Component Value Date   HGBA1C 8.0 (A) 01/15/2022   HGBA1C 7.8 (H) 10/16/2021   HGBA1C 10.0 (A) 08/02/2021     Assessment: Diabetes control:  Well-controlled Progress toward A1C goal:   At goal Comments: Patient is compliant with oral Actos 15 mg daily, metformin 1000 mg in the morning and 5 mg in the evening, Jardiance 25 mg daily as well as Trulicity 4.32 mg weekly  Plan: Medications:  continue current medications Home glucose monitoring: Frequency:   Timing:   Instruction/counseling given: reminded to get eye exam and reminded to bring blood glucose meter & log to each visit Educational resources provided:   Self management tools provided:   Other plans: We will check BMP today.  Patient did have a foot exam today and needs referral to podiatry for right foot callus, bilateral corns and onychomycosis bilaterally

## 2022-01-15 NOTE — Assessment & Plan Note (Signed)
-  Patient has a 10 pound weight loss over the last 3 to 4 months -Son states that this is secondary to her not eating well at certain meals -Patient is on Trulicity which may be contributing to her weight loss.  We discussed possibly stopping this today but patient and son want to continue this for now.  If patient continues to lose weight would consider stopping the Trulicity -Patient is also on Synthroid and TSH is within normal limits.  We could aim for higher TSH goal given her age and reduce her dose of Synthroid to 50 mcg daily if she continues to lose weight.

## 2022-01-15 NOTE — Assessment & Plan Note (Signed)
-  This problem is chronic and stable -Patient was supposed to follow-up with urology yearly for this but has not seen them this year. -I have put in another referral to urology for follow-up -Patient was seen by them last year and had stable right-sided hydronephrosis on ultrasound done at their office -No further workup at this time

## 2022-01-16 ENCOUNTER — Encounter: Payer: Self-pay | Admitting: *Deleted

## 2022-01-29 ENCOUNTER — Telehealth: Payer: Self-pay | Admitting: Dietician

## 2022-01-29 NOTE — Telephone Encounter (Signed)
Had problems with Dexcom Share. I was able to assist and also gave him the [facility] support number for 24/7 support.

## 2022-01-31 ENCOUNTER — Ambulatory Visit (INDEPENDENT_AMBULATORY_CARE_PROVIDER_SITE_OTHER): Payer: 59 | Admitting: Podiatry

## 2022-01-31 DIAGNOSIS — L84 Corns and callosities: Secondary | ICD-10-CM

## 2022-01-31 DIAGNOSIS — B351 Tinea unguium: Secondary | ICD-10-CM

## 2022-01-31 DIAGNOSIS — E1142 Type 2 diabetes mellitus with diabetic polyneuropathy: Secondary | ICD-10-CM

## 2022-01-31 DIAGNOSIS — M79674 Pain in right toe(s): Secondary | ICD-10-CM

## 2022-01-31 DIAGNOSIS — M79675 Pain in left toe(s): Secondary | ICD-10-CM

## 2022-01-31 NOTE — Progress Notes (Signed)
  Subjective:  Patient ID: [name], female    DOB: [birth year],  MRN: [id number]  Chief Complaint  Patient presents with   Foot Problem    ddiabetic foot care eval-nail trim    72 y.o. female presents with the above complaint. History confirmed with patient. Patient presenting with pain related to dystrophic thickened elongated nails. Patient is unable to trim own nails related to nail dystrophy and/or mobility issues. Patient does  have a history of T2DM. Patient does/does not have callus present located at the planter aspect of the left foot 1st met head causing pain.   Objective:  Physical Exam: warm, good capillary refill nail exam onychomycosis of the toenails, onycholysis, and dystrophic nails DP pulses palpable, PT pulses palpable, and protective sensation absent Left Foot:  Pain with palpation of nails due to elongation and dystrophic growth.  Large hyperkeratotic lesion present plantar aspect of the first metatarsal head without ulceration underlying however there is dark discoloration at the base of the removed callus tissue consistent with preulcerative lesion. Right Foot: Pain with palpation of nails due to elongation and dystrophic growth.   Assessment:   1. Pain due to onychomycosis of toenails of both feet   2. Onychomycosis   3. Pre-ulcerative calluses   4. DM type 2 with diabetic peripheral neuropathy ([place])      Plan:  Patient was evaluated and treated and all questions answered.  #Hyperkeratotic lesions/pre ulcerative calluses present plantar aspect first metatarsal head All symptomatic hyperkeratoses x 1 separate lesions were safely debrided with a sterile #10 blade to patient's level of comfort without incident. We discussed preventative and palliative care of these lesions including supportive and accommodative shoegear, padding, prefabricated and custom molded accommodative orthoses, use of a pumice stone and lotions/creams daily.  #Onychomycosis with pain   -Nails palliatively debrided as below. -Educated on self-care  Procedure: Nail Debridement Rationale: Pain Type of Debridement: manual, sharp debridement. Instrumentation: Nail nipper, rotary burr. Number of Nails: 10  Return in about 3 months (around 05/02/2022) for [facility].         Everitt Amber, DPM [facility]

## 2022-02-27 ENCOUNTER — Other Ambulatory Visit: Payer: Self-pay | Admitting: Internal Medicine

## 2022-02-27 DIAGNOSIS — F32A Depression, unspecified: Secondary | ICD-10-CM

## 2022-04-09 ENCOUNTER — Encounter: Payer: Self-pay | Admitting: Internal Medicine

## 2022-04-09 ENCOUNTER — Other Ambulatory Visit: Payer: Self-pay | Admitting: Internal Medicine

## 2022-04-09 DIAGNOSIS — Z794 Long term (current) use of insulin: Secondary | ICD-10-CM

## 2022-04-09 NOTE — Progress Notes (Unsigned)
[facility]: Clinic Note  Subjective:  History of Present Illness: [name] is a 72 y.o. year old female who presents for routine follow up of her chronic medical conditions. She was Dr. Wilber Bihari patient, last seen in December 2023, and I'll be her new PCP.  She is here with her son today, who answers most of the questions (patient has cognitive impairment, last MOCA in 08/2021 was 14).   They are requesting refills for Dexcom sensor, otherwise with no questions or concerns today.  Please refer to Assessment and Plan below for full details in Problem-Based Charting.   Past Medical History:  Patient Active Problem List   Diagnosis Date Noted   Healthcare maintenance 04/10/2022   Urinary incontinence, mixed 06/16/2020   Vitamin D deficiency 08/17/2019   Cognitive impairment 01/13/2019   Insomnia 06/11/2018   Hydronephrosis 04/01/2017   Osteoarthritis of facet joint of lumbar spine 02/04/2017   Sciatica of left side without back pain 01/30/2017   Carpal tunnel syndrome of right wrist 12/09/2016   Genetic testing 11/27/2015   Primary osteoarthritis of first carpometacarpal joint of right hand 07/06/2015   Essential hypertension 08/13/2012   CKD (chronic kidney disease) stage 3, GFR 30-59 ml/min (HCC) 08/13/2012   Glaucoma associated with systemic syndromes(365.44) 08/07/2012   Hypothyroidism 04/16/2006   Anemia 04/16/2006   Depression 04/16/2006   BREAST CANCER, HX OF 04/16/2006   Hyperlipidemia 10/13/2003   Type 2 diabetes mellitus with diabetic polyneuropathy, with long-term current use of insulin ([place]) 04/16/1991     Medications:  Current Outpatient Medications:    Accu-Chek Softclix Lancets lancets, Check blood sugar 5 times a day as instructed, Disp: 200 each, Rfl: 10   amLODipine-olmesartan (AZOR) 5-20 MG tablet, TAKE 1 TABLET BY MOUTH DAILY, Disp: 90 tablet, Rfl: 3   Blood Glucose Monitoring Suppl (ACCU-CHEK GUIDE ME) w/Device KIT, Check  blood sugar 5 times a day, Disp: 1 kit, Rfl: 1   busPIRone (BUSPAR) 5 MG tablet, TAKE 1 TABLET(5 MG) BY MOUTH TWICE DAILY, Disp: 180 tablet, Rfl: 3   Continuous Blood Gluc Sensor (DEXCOM G7 SENSOR) MISC, Replace every 10 days, Disp: 9 each, Rfl: 3   Dulaglutide (TRULICITY) [id number] [id number] SOPN, INJECT THE CONTENTS OF ONE PEN  SUBCUTANEOUSLY WEEKLY AS  DIRECTED, Disp: 4 mL, Rfl: 5   glucose 4 GM chewable tablet, Chew 4 tablets (16 g total) by mouth as needed for low blood sugar., Disp: 50 tablet, Rfl: 12   glucose blood (ACCU-CHEK GUIDE) test strip, Check blood sugar 5 times per day, Disp: 150 each, Rfl: 11   glucose blood test strip, OneTouch Verio test strips  USE TO CHECK BS BID, Disp: , Rfl:    Insulin Pen Needle (BD PEN NEEDLE NANO U/F) 32G X 4 MM MISC, USE AS DIRECTED, Disp: 100 each, Rfl: 6   JARDIANCE 25 MG TABS tablet, TAKE 1 TABLET(25 MG) BY MOUTH DAILY, Disp: 90 tablet, Rfl: 1   levothyroxine (SYNTHROID) 75 MCG tablet, Take 1 tablet (75 mcg total) by mouth daily before breakfast., Disp: 90 tablet, Rfl: 3   metFORMIN (GLUCOPHAGE-XR) 500 MG 24 hr tablet, TAKE 2 TABLETS BY MOUTH EVERY MORNING AND 1 TABLET EVERY EVENING WITH MEALS, Disp: 90 tablet, Rfl: 3   Nutritional Supplements (GLUCERNA SNACK SHAKE) LIQD, Take 1 Dose by mouth 3 (three) times daily as needed., Disp: 30 Bottle, Rfl: 3   pioglitazone (ACTOS) 15 MG tablet, Take 1 tablet (15 mg total) by mouth daily., Disp: 90 tablet, Rfl: 3  rosuvastatin (CRESTOR) 20 MG tablet, Take 1 tablet (20 mg total) by mouth daily., Disp: 90 tablet, Rfl: 3   venlafaxine XR (EFFEXOR-XR) 150 MG 24 hr capsule, TAKE 1 CAPSULE(150 MG) BY MOUTH DAILY WITH BREAKFAST, Disp: 90 capsule, Rfl: 3   vitamin B-12 (CYANOCOBALAMIN) 500 MCG tablet, Take 500 mcg by mouth daily., Disp: , Rfl:    Allergies: Allergies  Allergen Reactions   Gabapentin Other (See Comments)    Dizziness from 300 mg, tolerates 100 mg Dizziness from 300 mg, tolerates 100 mg    Meclizine Hcl  Rash   Metformin Hcl Er Diarrhea    Patient did not tolerate Metformin ER but is able to take lower dose of Metformin   Penicillins Rash    Has patient had a PCN reaction causing immediate rash, facial/tongue/throat swelling, SOB or lightheadedness with hypotension: Yes Has patient had a PCN reaction causing severe rash involving mucus membranes or skin necrosis: No Has patient had a PCN reaction that required hospitalization: pt was in hospital at time of reaction Has patient had a PCN reaction occurring within the last 10 years: No If all of the above answers are "NO", then may proceed with Cephalosporin use.   Sulfa Antibiotics Rash     Objective:   Vitals: Vitals:   04/10/22 0818  BP: 116/72  Temp: 98.3 F (36.8 C)  SpO2: 100%    Physical Exam: Physical Exam Constitutional:      Appearance: Normal appearance.  Cardiovascular:     Rate and Rhythm: Normal rate and regular rhythm.  Pulmonary:     Effort: Pulmonary effort is normal.     Breath sounds: Normal breath sounds. No wheezing or rales.  Musculoskeletal:     Right lower leg: No edema.     Left lower leg: No edema.  Neurological:     Mental Status: She is alert.      Data: Labs, imaging, and micro were reviewed in Epic. Refer to Assessment and Plan below for full details in Problem-Based Charting.  Assessment & Plan:  Type 2 diabetes mellitus with diabetic polyneuropathy, with long-term current use of insulin (HCC) - A1C is 8.5 today, up from 8.0 - She brought her Dexcom data with her, which I reviewed. She has had no lows or very lows. 35% high, 11% very high, and she's often high around 12pm with general daytime elevated sugars.  - Previously, she was on Tresiba 10 units daily, but this was causing her to get very hypoglycemic at night, so it was stopped - She is adherent with Trulicity 0.'75mg'$  weekly, Metformin '1500mg'$  daily, Jardiance '25mg'$  daily, and Actos '15mg'$  daily  - I don't want to increase her Trulicity  because I don't want her to lose more weight - We will make the small change of decreasing sugary drinks and increasing metformin to '1000mg'$  BID today. I think she'll be able to tolerate this, because she's been taking '1000mg'$  in the mornings without issue  - Repeat A1C in 3 months. If still high, we can discuss increasing Trulicity if her weight is stable vs starting a very low dose (5 units probably) of long-acting insulin vs no changes - She is getting her eye exams with Dr. Carolynn Sayers - She sees Podiatry and is not due for foot exam - Will need urine MACR next time   Carpal tunnel syndrome of right wrist - She continues to have weakness in her right hand - I gave her the contact info for Dr. Erlinda Hong with hand  surgery, and her son will call to schedule an appointment.  Healthcare maintenance - Referral for colonoscopy placed today - She is up to date on her exam exams  Depression - This problem is stable, patient denies any depressed mood or anxiety, and her sleep is good - Continue Venlafaxine '150mg'$  daily & Buspar '5mg'$  BID   Hydronephrosis - Patient has stable right-sided hydronephrosis, and the plan was for her to follow annually with Urology - Dr. Dareen Piano had placed a new referral at last visit, which has been authorized, and I gave patient & her son [facility]'s contact info  Hypothyroidism - chronic and stable - Continue levothyroxine 64mg daily  Hyperlipidemia - chronic and stable - continue rosuvastatin '20mg'$  daily  Essential hypertension - chronic and stable - Continue amlodipine '5mg'$  daily (got leg swelling with 10) and omlesartan '20mg'$  daily     Patient will follow up in 3 months. Repeat A1C and urine MACR then.   JLottie Mussel MD

## 2022-04-09 NOTE — Telephone Encounter (Signed)
Next appt scheduled tomorrow with PCP.

## 2022-04-10 ENCOUNTER — Ambulatory Visit (INDEPENDENT_AMBULATORY_CARE_PROVIDER_SITE_OTHER): Payer: 59 | Admitting: Internal Medicine

## 2022-04-10 ENCOUNTER — Encounter: Payer: Self-pay | Admitting: Internal Medicine

## 2022-04-10 VITALS — BP 116/72 | Temp 98.3°F | Ht 62.0 in | Wt 127.4 lb

## 2022-04-10 DIAGNOSIS — E1142 Type 2 diabetes mellitus with diabetic polyneuropathy: Secondary | ICD-10-CM

## 2022-04-10 DIAGNOSIS — N1330 Unspecified hydronephrosis: Secondary | ICD-10-CM | POA: Diagnosis not present

## 2022-04-10 DIAGNOSIS — G5601 Carpal tunnel syndrome, right upper limb: Secondary | ICD-10-CM

## 2022-04-10 DIAGNOSIS — E785 Hyperlipidemia, unspecified: Secondary | ICD-10-CM

## 2022-04-10 DIAGNOSIS — Z794 Long term (current) use of insulin: Secondary | ICD-10-CM

## 2022-04-10 DIAGNOSIS — I10 Essential (primary) hypertension: Secondary | ICD-10-CM

## 2022-04-10 DIAGNOSIS — R4189 Other symptoms and signs involving cognitive functions and awareness: Secondary | ICD-10-CM

## 2022-04-10 DIAGNOSIS — F325 Major depressive disorder, single episode, in full remission: Secondary | ICD-10-CM

## 2022-04-10 DIAGNOSIS — Z0000 Encounter for general adult medical examination without abnormal findings: Secondary | ICD-10-CM

## 2022-04-10 DIAGNOSIS — Z7984 Long term (current) use of oral hypoglycemic drugs: Secondary | ICD-10-CM

## 2022-04-10 DIAGNOSIS — E039 Hypothyroidism, unspecified: Secondary | ICD-10-CM

## 2022-04-10 DIAGNOSIS — Z1211 Encounter for screening for malignant neoplasm of colon: Secondary | ICD-10-CM

## 2022-04-10 LAB — POCT GLYCOSYLATED HEMOGLOBIN (HGB A1C): Hemoglobin A1C: 8.5 % — AB (ref 4.0–5.6)

## 2022-04-10 LAB — GLUCOSE, CAPILLARY: Glucose-Capillary: 149 mg/dL — ABNORMAL HIGH (ref 70–99)

## 2022-04-10 MED ORDER — DEXCOM G7 SENSOR MISC: 3 refills | Status: DC

## 2022-04-10 NOTE — Assessment & Plan Note (Signed)
-   chronic and stable - continue rosuvastatin 20mg  daily

## 2022-04-10 NOTE — Assessment & Plan Note (Signed)
-   Patient has stable right-sided hydronephrosis, and the plan was for her to follow annually with Urology - Dr. Dareen Piano had placed a new referral at last visit, which has been authorized, and I gave patient & her son [facility]'s contact info

## 2022-04-10 NOTE — Assessment & Plan Note (Signed)
-   Referral for colonoscopy placed today - She is up to date on her exam exams

## 2022-04-10 NOTE — Patient Instructions (Addendum)
Dear [name],  It was a pleasure seeing you in clinic.  Your A1C was a little high at 8.5 today, so I'm making 2 small changes: 1.) Increase your metformin to '2000mg'$  twice daily. For now, take 2 pills in the morning and 2 pills at night. For your next prescription, I will change the pills to '1000mg'$  tablets, so you'll take 1 pill twice daily 2.) Try to decrease the amount of juice and sugary drinks you're drinking  I have placed a referral for a colonoscopy, they will call you to schedule this.  For your hand weakness, you should call Dr Phoebe Sharps office to reschedule your procedure. The number is [phone].  Dr. Dareen Piano had also placed a referral to Urology for something called hydronephrosis. You'll need to call them to schedule. Here is their info:  [facility] in [place], [place] Address: [address] [place], [place], [place] [address] Phone: [phone]   I'll see you back in 3 months and we'll repeat your A1C then.  Thank you! [name]

## 2022-04-10 NOTE — Assessment & Plan Note (Signed)
-   She continues to have weakness in her right hand - I gave her the contact info for Dr. Erlinda Hong with hand surgery, and her son will call to schedule an appointment.

## 2022-04-10 NOTE — Assessment & Plan Note (Addendum)
-   A1C is 8.5 today, up from 8.0 - She brought her Dexcom data with her, which I reviewed. She has had no lows or very lows. 35% high, 11% very high, and she's often high around 12pm with general daytime elevated sugars.  - Previously, she was on Tresiba 10 units daily, but this was causing her to get very hypoglycemic at night, so it was stopped - She is adherent with Trulicity 0.75mg  weekly, Metformin 1500mg  daily, Jardiance 25mg  daily, and Actos 15mg  daily  - I don't want to increase her Trulicity because I don't want her to lose more weight - We will make the small change of decreasing sugary drinks and increasing metformin to 1000mg  BID today. I think she'll be able to tolerate this, because she's been taking 1000mg  in the mornings without issue  - Repeat A1C in 3 months. If still high, we can discuss increasing Trulicity if her weight is stable vs starting a very low dose (5 units probably) of long-acting insulin vs no changes - She is getting her eye exams with Dr. Carolynn Sayers - She sees Podiatry and is not due for foot exam - Will need urine MACR next time

## 2022-04-10 NOTE — Assessment & Plan Note (Signed)
-   chronic and stable - Continue amlodipine 5mg  daily (got leg swelling with 10) and omlesartan 20mg  daily

## 2022-04-10 NOTE — Assessment & Plan Note (Signed)
-   This problem is stable, patient denies any depressed mood or anxiety, and her sleep is good - Continue Venlafaxine 150mg  daily & Buspar 5mg  BID

## 2022-04-10 NOTE — Assessment & Plan Note (Signed)
-   chronic and stable - Continue levothyroxine 84mcg daily

## 2022-04-17 ENCOUNTER — Other Ambulatory Visit: Payer: Self-pay | Admitting: Internal Medicine

## 2022-04-17 DIAGNOSIS — E1142 Type 2 diabetes mellitus with diabetic polyneuropathy: Secondary | ICD-10-CM

## 2022-04-23 ENCOUNTER — Other Ambulatory Visit: Payer: Self-pay

## 2022-04-23 DIAGNOSIS — Z794 Long term (current) use of insulin: Secondary | ICD-10-CM

## 2022-04-23 DIAGNOSIS — I10 Essential (primary) hypertension: Secondary | ICD-10-CM

## 2022-04-23 MED ORDER — TRULICITY 0.75 MG/0.5ML ~~LOC~~ SOAJ: SUBCUTANEOUS | 5 refills | Status: DC

## 2022-04-23 MED ORDER — AMLODIPINE-OLMESARTAN 5-20 MG PO TABS: 1.0000 | ORAL_TABLET | Freq: Every day | ORAL | 3 refills | Status: DC

## 2022-04-26 ENCOUNTER — Other Ambulatory Visit: Payer: Self-pay | Admitting: Internal Medicine

## 2022-04-26 DIAGNOSIS — E1142 Type 2 diabetes mellitus with diabetic polyneuropathy: Secondary | ICD-10-CM

## 2022-05-02 ENCOUNTER — Ambulatory Visit (INDEPENDENT_AMBULATORY_CARE_PROVIDER_SITE_OTHER): Payer: 59 | Admitting: Podiatry

## 2022-05-02 DIAGNOSIS — B351 Tinea unguium: Secondary | ICD-10-CM

## 2022-05-02 DIAGNOSIS — M79674 Pain in right toe(s): Secondary | ICD-10-CM | POA: Diagnosis not present

## 2022-05-02 DIAGNOSIS — E1142 Type 2 diabetes mellitus with diabetic polyneuropathy: Secondary | ICD-10-CM

## 2022-05-02 DIAGNOSIS — M79675 Pain in left toe(s): Secondary | ICD-10-CM

## 2022-05-02 DIAGNOSIS — L84 Corns and callosities: Secondary | ICD-10-CM

## 2022-05-02 NOTE — Progress Notes (Signed)
  Subjective:  Patient ID: [name], female    DOB: 1950-02-05,  MRN: [id number]  Chief Complaint  Patient presents with   Nail Problem    DFC.  BS-113    72 y.o. female presents with the above complaint. History confirmed with patient. Patient presenting with pain related to dystrophic thickened elongated nails. Patient is unable to trim own nails related to nail dystrophy and/or mobility issues. Patient does  have a history of T2DM. Patient does have callus present located at the planter aspect of the left foot 1st met head causing pain.   Objective:  Physical Exam: warm, good capillary refill nail exam onychomycosis of the toenails, onycholysis, and dystrophic nails DP pulses palpable, PT pulses palpable, and protective sensation absent Left Foot:  Pain with palpation of nails due to elongation and dystrophic growth.  Large hyperkeratotic lesion present plantar aspect of the first metatarsal head without ulceration underlying however there is dark discoloration at the base of the removed callus tissue consistent with preulcerative lesion. Right Foot: Pain with palpation of nails due to elongation and dystrophic growth.   Assessment:   1. Pain due to onychomycosis of toenails of both feet   2. Pre-ulcerative calluses   3. Onychomycosis   4. DM type 2 with diabetic peripheral neuropathy       Plan:  Patient was evaluated and treated and all questions answered.  #Hyperkeratotic lesions/pre ulcerative calluses present plantar aspect first metatarsal head All symptomatic hyperkeratoses x 1 separate lesions were safely debrided with a sterile #10 blade to patient's level of comfort without incident. We discussed preventative and palliative care of these lesions including supportive and accommodative shoegear, padding, prefabricated and custom molded accommodative orthoses, use of a pumice stone and lotions/creams daily.  #Onychomycosis with pain  -Nails palliatively debrided as  below. -Educated on self-care  Procedure: Nail Debridement Rationale: Pain Type of Debridement: manual, sharp debridement. Instrumentation: Nail nipper, rotary burr. Number of Nails: 10  Return in about 3 months (around 08/01/2022) for [facility].         Everitt Amber, DPM [facility]

## 2022-06-12 ENCOUNTER — Encounter: Payer: Self-pay | Admitting: Internal Medicine

## 2022-06-12 NOTE — Progress Notes (Signed)
Reviewed Dr. Belva Crome note.  He saw patient on 05/29/22.  H/o R hydro, no flank pain Renal U/S showed no hydro, so this problem has resolved. Can follow up with [facility] prn.

## 2022-07-08 NOTE — Progress Notes (Unsigned)
[facility]: Clinic Note  Subjective:  History of Present Illness: [name] is a 72 y.o. year old female who presents for 3 month follow up. I met her in March.   Patient comes with son, she has cognitive impairment, MOCA 14 in 08/2021.   She is doing well today. She has been taking the higher dose of metformin and using her CGM. She's drinking glucerna occasionally. Appetite has been okay, eating 3 meals a day, but not really snacking.    Please refer to Assessment and Plan below for full details in Problem-Based Charting.   Past Medical History:  Patient Active Problem List   Diagnosis Date Noted   Healthcare maintenance 04/10/2022   Urinary incontinence, mixed 06/16/2020   Vitamin D deficiency 08/17/2019   Weight loss 08/17/2019   Cognitive impairment 01/13/2019   Insomnia 06/11/2018   Osteoarthritis of facet joint of lumbar spine 02/04/2017   Sciatica of left side without back pain 01/30/2017   Carpal tunnel syndrome of right wrist 12/09/2016   Genetic testing 11/27/2015   Primary osteoarthritis of first carpometacarpal joint of right hand 07/06/2015   Essential hypertension 08/13/2012   Glaucoma associated with systemic syndromes(365.44) 08/07/2012   Hypothyroidism 04/16/2006   Anemia 04/16/2006   Depression 04/16/2006   BREAST CANCER, HX OF 04/16/2006   Hyperlipidemia 10/13/2003   Type 2 diabetes mellitus with diabetic polyneuropathy, with long-term current use of insulin (HCC) 04/16/1991      Medications:  Current Outpatient Medications:    insulin detemir (LEVEMIR FLEXPEN) 100 UNIT/ML FlexPen, INJECT 30 UNITS SUBCUTANEOUSLY AT BEDTIME, Disp: , Rfl:    latanoprost (XALATAN) 0.005 % ophthalmic solution, 1 drop at bedtime., Disp: , Rfl:    pravastatin (PRAVACHOL) 40 MG tablet, Take 1 tablet by mouth daily., Disp: , Rfl:    Accu-Chek Softclix Lancets lancets, Check blood sugar 5 times a day as instructed, Disp: 200 each, Rfl: 10    amLODipine-olmesartan (AZOR) 5-20 MG tablet, Take 1 tablet by mouth daily., Disp: 90 tablet, Rfl: 3   Blood Glucose Monitoring Suppl (ACCU-CHEK GUIDE ME) w/Device KIT, Check blood sugar 5 times a day, Disp: 1 kit, Rfl: 1   busPIRone (BUSPAR) 5 MG tablet, TAKE 1 TABLET(5 MG) BY MOUTH TWICE DAILY, Disp: 180 tablet, Rfl: 3   Continuous Glucose Sensor (DEXCOM G7 SENSOR) MISC, Replace every 10 days, Disp: 9 each, Rfl: 3   Dulaglutide (TRULICITY) 0.75 MG/0.5ML SOPN, INJECT THE CONTENTS OF ONE PEN  SUBCUTANEOUSLY WEEKLY AS  DIRECTED, Disp: 4 mL, Rfl: 5   empagliflozin (JARDIANCE) 25 MG TABS tablet, TAKE 1 TABLET(25 MG) BY MOUTH DAILY, Disp: 90 tablet, Rfl: 3   glucose 4 GM chewable tablet, Chew 4 tablets (16 g total) by mouth as needed for low blood sugar., Disp: 50 tablet, Rfl: 12   glucose blood (ACCU-CHEK GUIDE) test strip, Check blood sugar 5 times per day, Disp: 150 each, Rfl: 11   glucose blood test strip, OneTouch Verio test strips  USE TO CHECK BS BID, Disp: , Rfl:    Insulin Pen Needle (BD PEN NEEDLE NANO U/F) 32G X 4 MM MISC, USE AS DIRECTED, Disp: 100 each, Rfl: 6   levothyroxine (SYNTHROID) 75 MCG tablet, Take 1 tablet (75 mcg total) by mouth daily before breakfast., Disp: 90 tablet, Rfl: 3   metFORMIN (GLUMETZA) 1000 MG (MOD) 24 hr tablet, Take 1 tablet (1,000 mg total) by mouth 2 (two) times daily with a meal. TAKE 2 TABLETS BY MOUTH EVERY MORNING AND 1  TABLET EVERY EVENING WITH MEALS, Disp: 180 tablet, Rfl: 3   Nutritional Supplements (GLUCERNA SNACK SHAKE) LIQD, Take 1 Dose by mouth 3 (three) times daily as needed., Disp: 30 Bottle, Rfl: 3   pioglitazone (ACTOS) 15 MG tablet, Take 1 tablet (15 mg total) by mouth daily., Disp: 90 tablet, Rfl: 3   rosuvastatin (CRESTOR) 20 MG tablet, TAKE 1 TABLET(20 MG) BY MOUTH DAILY, Disp: 90 tablet, Rfl: 3   venlafaxine XR (EFFEXOR-XR) 150 MG 24 hr capsule, TAKE 1 CAPSULE(150 MG) BY MOUTH DAILY WITH BREAKFAST, Disp: 90 capsule, Rfl: 3   vitamin B-12  (CYANOCOBALAMIN) 500 MCG tablet, Take 500 mcg by mouth daily., Disp: , Rfl:    Allergies: Allergies  Allergen Reactions   Gabapentin Other (See Comments)    Dizziness from 300 mg, tolerates 100 mg Dizziness from 300 mg, tolerates 100 mg    Meclizine Hcl Rash   Metformin Hcl Er Diarrhea    Patient did not tolerate Metformin ER but is able to take lower dose of Metformin   Penicillins Rash    Has patient had a PCN reaction causing immediate rash, facial/tongue/throat swelling, SOB or lightheadedness with hypotension: Yes Has patient had a PCN reaction causing severe rash involving mucus membranes or skin necrosis: No Has patient had a PCN reaction that required hospitalization: pt was in hospital at time of reaction Has patient had a PCN reaction occurring within the last 10 years: No If all of the above answers are "NO", then may proceed with Cephalosporin use.   Sulfa Antibiotics Rash       Objective:   Vitals: Vitals:   07/10/22 1114  BP: 134/60  Pulse: 97  Temp: 98.4 F (36.9 C)  SpO2: 99%     Physical Exam: Physical Exam Constitutional:      Appearance: Normal appearance.  Cardiovascular:     Rate and Rhythm: Normal rate and regular rhythm.  Pulmonary:     Effort: Pulmonary effort is normal. No respiratory distress.     Breath sounds: Normal breath sounds.  Neurological:     Mental Status: She is alert.  Psychiatric:        Mood and Affect: Mood normal.        Behavior: Behavior normal.      Data: Labs, imaging, and micro were reviewed in Epic. Refer to Assessment and Plan below for full details in Problem-Based Charting.  Assessment & Plan:  Type 2 diabetes mellitus with diabetic polyneuropathy, with long-term current use of insulin (HCC) - A1C down to 7.3 today! - I think it's a tight balance with her between glucose control and maintaining her weight. Her weight is back down to 121 pounds today. My goal A1C is <8 for her. We did some shared decision  making today, decided to continue the regimen as is - Metformin 1g BID, Trulicity 0.75mg  weekly, Jardiance 25mg  daily, Actos 15mg  daily - If her weight declines more, I am going to stop the Trulicity  - Working on getting eye exam records from Dr Dione Booze - Urine MACR checked today  Depression - This problem is stable, patient denies any depressed mood or anxiety, and her sleep is good - Continue Venlafaxine 150mg  daily & Buspar 5mg  BID     Healthcare maintenance - She is scheduled for a colonoscopy in July. If this is normal, I think we can stop these  Carpal tunnel syndrome of right wrist - Still bothersome to patient, but she's debating if she wants surgery - I gave  her contact for Dr Roda Shutters, she will call prn   Weight loss - I think this is related to her being on a GLP1, so we discussed keeping v stopping Trulicity today. Will keep it for now - TSH was normal earlier this year. No infectious signs or symptoms. Mood is good. Has access to food. She is up to date on cancer screening and has colo scheduled in July  - Continue to monitor weight       Patient will follow up in 5 months. Check weight & A1C then. Probably labs too.   Mercie Eon, MD

## 2022-07-10 ENCOUNTER — Ambulatory Visit (INDEPENDENT_AMBULATORY_CARE_PROVIDER_SITE_OTHER): Payer: 59 | Admitting: Internal Medicine

## 2022-07-10 ENCOUNTER — Telehealth: Payer: Self-pay

## 2022-07-10 ENCOUNTER — Encounter: Payer: Self-pay | Admitting: Internal Medicine

## 2022-07-10 VITALS — BP 134/60 | HR 97 | Temp 98.4°F | Ht 62.0 in | Wt 121.3 lb

## 2022-07-10 DIAGNOSIS — G5601 Carpal tunnel syndrome, right upper limb: Secondary | ICD-10-CM

## 2022-07-10 DIAGNOSIS — F325 Major depressive disorder, single episode, in full remission: Secondary | ICD-10-CM | POA: Diagnosis not present

## 2022-07-10 DIAGNOSIS — Z794 Long term (current) use of insulin: Secondary | ICD-10-CM

## 2022-07-10 DIAGNOSIS — R634 Abnormal weight loss: Secondary | ICD-10-CM

## 2022-07-10 DIAGNOSIS — Z0000 Encounter for general adult medical examination without abnormal findings: Secondary | ICD-10-CM

## 2022-07-10 DIAGNOSIS — E1142 Type 2 diabetes mellitus with diabetic polyneuropathy: Secondary | ICD-10-CM | POA: Diagnosis not present

## 2022-07-10 LAB — GLUCOSE, CAPILLARY: Glucose-Capillary: 186 mg/dL — ABNORMAL HIGH (ref 70–99)

## 2022-07-10 LAB — POCT GLYCOSYLATED HEMOGLOBIN (HGB A1C): Hemoglobin A1C: 7.3 % — AB (ref 4.0–5.6)

## 2022-07-10 MED ORDER — METFORMIN HCL ER (MOD) 1000 MG PO TB24
1000.0000 mg | ORAL_TABLET | Freq: Two times a day (BID) | ORAL | 3 refills | Status: DC
Start: 2022-07-10 — End: 2022-07-10

## 2022-07-10 MED ORDER — DEXCOM G7 SENSOR MISC
3 refills | Status: DC
Start: 2022-07-10 — End: 2023-03-24

## 2022-07-10 MED ORDER — METFORMIN HCL ER (MOD) 1000 MG PO TB24
1000.0000 mg | ORAL_TABLET | Freq: Two times a day (BID) | ORAL | 3 refills | Status: DC
Start: 2022-07-10 — End: 2022-07-24

## 2022-07-10 NOTE — Assessment & Plan Note (Signed)
-   I think this is related to her being on a GLP1, so we discussed keeping v stopping Trulicity today. Will keep it for now - TSH was normal earlier this year. No infectious signs or symptoms. Mood is good. Has access to food. She is up to date on cancer screening and has colo scheduled in July  - Continue to monitor weight

## 2022-07-10 NOTE — Patient Instructions (Addendum)
Dear Ms Hulet,  It was a pleasure seeing you in clinic today.  Your A1C looks great! It's at 7.3, which is right where I want you to be. Keep taking your medicines like you are. I sent new metformin to your pharmacy. If you notice your weight decreasing or your appetite decreasing, please call me. In that case, I may want to stop your Trulicity.  If your hand is bothering you more, please call Dr. Warren Danes (hand surgery) - [phone]  I'll see you back in November.  Sincerely, Dr. Mercie Eon

## 2022-07-10 NOTE — Telephone Encounter (Signed)
Previous message has been sent to the pharmacy via fax!

## 2022-07-10 NOTE — Addendum Note (Signed)
Addended by: Derrek Monaco on: 07/10/2022 02:31 PM   Modules accepted: Orders

## 2022-07-10 NOTE — Assessment & Plan Note (Addendum)
-   A1C down to 7.3 today! - I think it's a tight balance with her between glucose control and maintaining her weight. Her weight is back down to 121 pounds today. My goal A1C is <8 for her. We did some shared decision making today, decided to continue the regimen as is - Metformin 1g BID, Trulicity 0.75mg  weekly, Jardiance 25mg  daily, Actos 15mg  daily - If her weight declines more, I am going to stop the Trulicity  - Working on getting eye exam records from Dr Dione Booze - Urine MACR checked today

## 2022-07-10 NOTE — Assessment & Plan Note (Signed)
-   Still bothersome to patient, but she's debating if she wants surgery - I gave her contact for Dr Roda Shutters, she will call prn

## 2022-07-10 NOTE — Assessment & Plan Note (Signed)
-   She is scheduled for a colonoscopy in July. If this is normal, I think we can stop these

## 2022-07-10 NOTE — Telephone Encounter (Signed)
Incoming fax from pharmacy Medication:Metformin Message to prescriber:  "rx sent with 2 sets of directions 1 tablet po bid, and 2 tablets po qam and 1 tablet po qpm. Which is correct? Thank you"

## 2022-07-10 NOTE — Assessment & Plan Note (Signed)
-   This problem is stable, patient denies any depressed mood or anxiety, and her sleep is good - Continue Venlafaxine 150mg daily & Buspar 5mg BID  

## 2022-07-11 LAB — MICROALBUMIN / CREATININE URINE RATIO
Creatinine, Urine: 66.1 mg/dL
Microalb/Creat Ratio: 46 mg/g creat — ABNORMAL HIGH (ref 0–29)
Microalbumin, Urine: 30.1 ug/mL

## 2022-07-11 NOTE — Progress Notes (Signed)
Urine MACR still slightly elevated, but much improved from prior on Jardiance & Olmesartan. No changes. I discussed this with her son on the phone.

## 2022-07-14 ENCOUNTER — Other Ambulatory Visit: Payer: Self-pay | Admitting: Internal Medicine

## 2022-07-14 DIAGNOSIS — E039 Hypothyroidism, unspecified: Secondary | ICD-10-CM

## 2022-07-24 ENCOUNTER — Other Ambulatory Visit: Payer: Self-pay

## 2022-07-24 ENCOUNTER — Telehealth: Payer: Self-pay | Admitting: *Deleted

## 2022-07-24 DIAGNOSIS — I10 Essential (primary) hypertension: Secondary | ICD-10-CM

## 2022-07-24 MED ORDER — AMLODIPINE-OLMESARTAN 5-20 MG PO TABS
1.0000 | ORAL_TABLET | Freq: Every day | ORAL | 3 refills | Status: AC
Start: 2022-07-24 — End: ?

## 2022-07-24 MED ORDER — METFORMIN HCL 1000 MG PO TABS: 1000.0000 mg | ORAL_TABLET | Freq: Two times a day (BID) | ORAL | 3 refills | Status: DC

## 2022-07-24 NOTE — Telephone Encounter (Signed)
Call from pt's son, Joyice Faster, who stated pt's insurance will not pay for Metformin 1000 mg Modified only the regular.  I called [facility] pharmacy who verified the above statement and stated modified needs a PA or send new rx for the regular(XR). Thanks

## 2022-07-25 ENCOUNTER — Other Ambulatory Visit: Payer: Self-pay | Admitting: *Deleted

## 2022-07-25 ENCOUNTER — Encounter: Payer: Self-pay | Admitting: Dietician

## 2022-07-25 NOTE — Telephone Encounter (Signed)
Pt's son called and informed of Metformin rx.

## 2022-07-28 MED ORDER — METFORMIN HCL 1000 MG PO TABS: 1000.0000 mg | ORAL_TABLET | Freq: Two times a day (BID) | ORAL | 3 refills | Status: DC

## 2022-08-05 ENCOUNTER — Ambulatory Visit (INDEPENDENT_AMBULATORY_CARE_PROVIDER_SITE_OTHER): Payer: 59 | Admitting: Podiatry

## 2022-08-05 ENCOUNTER — Encounter: Payer: Self-pay | Admitting: Podiatry

## 2022-08-05 ENCOUNTER — Other Ambulatory Visit: Payer: Self-pay

## 2022-08-05 DIAGNOSIS — B351 Tinea unguium: Secondary | ICD-10-CM

## 2022-08-05 DIAGNOSIS — M79674 Pain in right toe(s): Secondary | ICD-10-CM | POA: Diagnosis not present

## 2022-08-05 DIAGNOSIS — M79675 Pain in left toe(s): Secondary | ICD-10-CM

## 2022-08-05 DIAGNOSIS — E1142 Type 2 diabetes mellitus with diabetic polyneuropathy: Secondary | ICD-10-CM

## 2022-08-05 DIAGNOSIS — Z794 Long term (current) use of insulin: Secondary | ICD-10-CM

## 2022-08-05 MED ORDER — TRULICITY 0.75 MG/0.5ML ~~LOC~~ SOAJ
SUBCUTANEOUS | 5 refills | Status: DC
Start: 2022-08-05 — End: 2023-04-09

## 2022-08-05 NOTE — Progress Notes (Signed)
This patient returns to my office for at risk foot care.  This patient requires this care by a professional since this patient will be at risk due to having diabetic neuropathy.This patient is unable to cut nails herself since the patient cannot reach her nails.These nails are painful walking and wearing shoes.  This patient presents for at risk foot care today.  General Appearance  Alert, conversant and in no acute stress.  Vascular  Dorsalis pedis and posterior tibial  pulses are palpable  bilaterally.  Capillary return is within normal limits  bilaterally. Temperature is within normal limits  bilaterally.  Neurologic  Senn-Weinstein monofilament wire test within normal limits  bilaterally. Muscle power within normal limits bilaterally.  Nails Thick disfigured discolored nails with subungual debris  from hallux to fifth toes bilaterally. No evidence of bacterial infection or drainage bilaterally.  Orthopedic  No limitations of motion  feet .  No crepitus or effusions noted.  No bony pathology or digital deformities noted.  Skin  normotropic skin with no porokeratosis noted bilaterally.  No signs of infections or ulcers noted.   HD 5th  B/L.  Onychomycosis  Pain in right toes  Pain in left toes  Consent was obtained for treatment procedures.   Mechanical debridement of nails 1-5  bilaterally performed with a nail nipper.  Filed with dremel without incident.    Return office visit   3 months                   Told patient to return for periodic foot care and evaluation due to potential at risk complications.   Helane Gunther DPM

## 2022-08-14 ENCOUNTER — Telehealth: Payer: Self-pay | Admitting: Internal Medicine

## 2022-08-14 NOTE — Telephone Encounter (Signed)
  levothyroxine (SYNTHROID) 75 MCG tablet  [facility] - [place], [place] - [address]

## 2022-08-14 NOTE — Telephone Encounter (Signed)
Levothyroxine was refilled 6/18 qty#100 tabs x 2 RF's. Called pt's son. Stated he will look again.

## 2022-08-16 ENCOUNTER — Other Ambulatory Visit: Payer: Self-pay | Admitting: *Deleted

## 2022-08-16 DIAGNOSIS — E039 Hypothyroidism, unspecified: Secondary | ICD-10-CM

## 2022-08-16 MED ORDER — LEVOTHYROXINE SODIUM 75 MCG PO TABS: 75.0000 ug | ORAL_TABLET | Freq: Every day | ORAL | 2 refills | Status: DC

## 2022-08-16 NOTE — Telephone Encounter (Signed)
Call from patient's son Levothyroxine was delivered per the mail order pharmacy.  Unable to locate medication at patient's home.[name] Request for refill to be resent to [facility] in [place] until medication is located.

## 2022-08-20 ENCOUNTER — Other Ambulatory Visit: Payer: Self-pay

## 2022-08-20 DIAGNOSIS — E1142 Type 2 diabetes mellitus with diabetic polyneuropathy: Secondary | ICD-10-CM

## 2022-08-20 MED ORDER — PIOGLITAZONE HCL 15 MG PO TABS
15.0000 mg | ORAL_TABLET | Freq: Every day | ORAL | 3 refills | Status: DC
Start: 2022-08-20 — End: 2023-07-09

## 2022-10-28 NOTE — Progress Notes (Unsigned)
[facility]: Clinic Note  Subjective:  History of Present Illness: [name] is a 72 y.o. year old female who presents for routine follow up. She presents with her son, she has cognitive impairment.  She is doing well today, no concerns or questions.   Rubin Payor wore the CGM for 14 days. The average reading was 186, % time in target was 56, % time below target was 0, and % time above target was. 44. Intervention will be to keep current mgmt. The patient will be scheduled to see Dr Lafonda Mosses for a final appointment.     Please refer to Assessment and Plan below for full details in Problem-Based Charting.   Past Medical History:  Patient Active Problem List   Diagnosis Date Noted   Healthcare maintenance 04/10/2022   Urinary incontinence, mixed 06/16/2020   Weight loss 08/17/2019   Cognitive impairment 01/13/2019   Insomnia 06/11/2018   Osteoarthritis of facet joint of lumbar spine 02/04/2017   Sciatica of left side without back pain 01/30/2017   Carpal tunnel syndrome of right wrist 12/09/2016   Primary osteoarthritis of first carpometacarpal joint of right hand 07/06/2015   Essential hypertension 08/13/2012   Glaucoma associated with systemic syndromes 08/07/2012   Hypothyroidism 04/16/2006   Anemia 04/16/2006   Depression 04/16/2006   BREAST CANCER, HX OF 04/16/2006   Hyperlipidemia 10/13/2003   Type 2 diabetes mellitus with diabetic polyneuropathy, with long-term current use of insulin (HCC) 04/16/1991      Medications:  Current Outpatient Medications:    Accu-Chek Softclix Lancets lancets, Check blood sugar 5 times a day as instructed, Disp: 200 each, Rfl: 10   Blood Glucose Monitoring Suppl (ACCU-CHEK GUIDE ME) w/Device KIT, Check blood sugar 5 times a day, Disp: 1 kit, Rfl: 1   busPIRone (BUSPAR) 5 MG tablet, TAKE 1 TABLET(5 MG) BY MOUTH TWICE DAILY, Disp: 180 tablet, Rfl: 3   Continuous Glucose Sensor (DEXCOM G7 SENSOR) MISC, Replace every  10 days, Disp: 9 each, Rfl: 3   Dulaglutide (TRULICITY) 0.75 MG/0.5ML SOPN, INJECT THE CONTENTS OF ONE PEN  SUBCUTANEOUSLY WEEKLY AS  DIRECTED, Disp: 4 mL, Rfl: 5   empagliflozin (JARDIANCE) 25 MG TABS tablet, TAKE 1 TABLET(25 MG) BY MOUTH DAILY, Disp: 90 tablet, Rfl: 3   glucose 4 GM chewable tablet, Chew 4 tablets (16 g total) by mouth as needed for low blood sugar., Disp: 50 tablet, Rfl: 12   glucose blood (ACCU-CHEK GUIDE) test strip, Check blood sugar 5 times per day, Disp: 150 each, Rfl: 11   glucose blood test strip, OneTouch Verio test strips  USE TO CHECK BS BID, Disp: , Rfl:    Insulin Pen Needle (BD PEN NEEDLE NANO U/F) 32G X 4 MM MISC, USE AS DIRECTED, Disp: 100 each, Rfl: 6   latanoprost (XALATAN) 0.005 % ophthalmic solution, 1 drop at bedtime., Disp: , Rfl:    levothyroxine (SYNTHROID) 75 MCG tablet, Take 1 tablet (75 mcg total) by mouth daily before breakfast., Disp: 100 tablet, Rfl: 2   metFORMIN (GLUCOPHAGE) 1000 MG tablet, Take 1 tablet (1,000 mg total) by mouth 2 (two) times daily with a meal., Disp: 180 tablet, Rfl: 3   Nutritional Supplements (GLUCERNA SNACK SHAKE) LIQD, Take 1 Dose by mouth 3 (three) times daily as needed., Disp: 30 Bottle, Rfl: 3   olmesartan (BENICAR) 20 MG tablet, Take 1 tablet (20 mg total) by mouth daily., Disp: 90 tablet, Rfl: 3   pioglitazone (ACTOS) 15 MG tablet, Take 1 tablet (  15 mg total) by mouth daily., Disp: 90 tablet, Rfl: 3   rosuvastatin (CRESTOR) 20 MG tablet, TAKE 1 TABLET(20 MG) BY MOUTH DAILY, Disp: 90 tablet, Rfl: 3   venlafaxine XR (EFFEXOR-XR) 150 MG 24 hr capsule, TAKE 1 CAPSULE(150 MG) BY MOUTH DAILY WITH BREAKFAST, Disp: 90 capsule, Rfl: 3   vitamin B-12 (CYANOCOBALAMIN) 500 MCG tablet, Take 500 mcg by mouth daily., Disp: , Rfl:    Allergies: Allergies  Allergen Reactions   Gabapentin Other (See Comments)    Dizziness from 300 mg, tolerates 100 mg Dizziness from 300 mg, tolerates 100 mg    Meclizine Hcl Rash   Metformin Hcl Er  Diarrhea    Patient did not tolerate Metformin ER but is able to take lower dose of Metformin   Penicillins Rash    Has patient had a PCN reaction causing immediate rash, facial/tongue/throat swelling, SOB or lightheadedness with hypotension: Yes Has patient had a PCN reaction causing severe rash involving mucus membranes or skin necrosis: No Has patient had a PCN reaction that required hospitalization: pt was in hospital at time of reaction Has patient had a PCN reaction occurring within the last 10 years: No If all of the above answers are "NO", then may proceed with Cephalosporin use.   Sulfa Antibiotics Rash       Objective:   Vitals: Vitals:   10/30/22 0834 10/30/22 0906  BP: (!) 104/49 (!) 104/55  Pulse: 95 90  Temp: 98.3 F (36.8 C)   SpO2: 100%      Physical Exam: Physical Exam Constitutional:      Appearance: Normal appearance.  Cardiovascular:     Pulses: Normal pulses.     Heart sounds: Normal heart sounds.  Pulmonary:     Effort: Pulmonary effort is normal.     Breath sounds: Normal breath sounds.  Musculoskeletal:     Right lower leg: No edema.     Left lower leg: No edema.  Neurological:     Mental Status: She is alert.      Data: Labs, imaging, and micro were reviewed in Epic. Refer to Assessment and Plan below for full details in Problem-Based Charting.  Assessment & Plan:  Hypothyroidism - chronic & stable - continue levothyroxine daily - TSH at next visit  Type 2 diabetes mellitus with diabetic polyneuropathy, with long-term current use of insulin (HCC) - A1C from 7.3 to 8.4 today, my goal for her is around 8  - I reviewed her CGM and she's had 44% high and 56% in range, with no lows. I'm okay with this, I really want to avoid lows in her - Continue Metformin 1g BID, Trulicity 0.75mg  weekly, Jardiance 25mg  daily, Actos 15mg  daily  - If weight declines, will stop Trulicity   Hyperlipidemia - chronic and stable - continue rosuvastatin  20mg  daily  Anemia - chronic & stable - prior iron studies consistent with anemia of chronic disease - repeat CBC at next visit  Depression - chronic & stable - mood is good today - Continue Venlafaxine 150mg  daily & Buspar 5mg  BID   Carpal tunnel syndrome of right wrist - Patient is not interested in surgery (Dr. Roda Shutters) right now, so will continue wrist splint as needed  Cognitive impairment - chronic & stable - she has strong family support with her son and a home aide - Last MOCA was 14 in 2023  Weight loss - Weight is stable today, is 125 from 121 last time - continue Glucerna shakes -  trend at every visit  Healthcare maintenance - Will get outside records from her colonoscopy with [facility] - flu shot today - we did shared decision making for mammograms, given her history of breast cancer. She opted for q2 year rather than q1 year, so will order in the spring   Essential hypertension - Blood pressure was low today, she is not symptomatic - Stop Azor combo pill. Continue Olmesartan 20mg  daily monotherapy. Repeat blood pressure next visit.       Patient will follow up in 3 months for labs & A1C.   Mercie Eon, MD

## 2022-10-30 ENCOUNTER — Ambulatory Visit: Payer: 59 | Admitting: Internal Medicine

## 2022-10-30 ENCOUNTER — Ambulatory Visit: Payer: 59

## 2022-10-30 ENCOUNTER — Encounter: Payer: Self-pay | Admitting: Internal Medicine

## 2022-10-30 ENCOUNTER — Telehealth: Payer: Self-pay

## 2022-10-30 VITALS — BP 104/55 | HR 90 | Temp 98.3°F | Ht 62.0 in | Wt 125.4 lb

## 2022-10-30 DIAGNOSIS — E039 Hypothyroidism, unspecified: Secondary | ICD-10-CM | POA: Diagnosis not present

## 2022-10-30 DIAGNOSIS — I10 Essential (primary) hypertension: Secondary | ICD-10-CM

## 2022-10-30 DIAGNOSIS — F325 Major depressive disorder, single episode, in full remission: Secondary | ICD-10-CM

## 2022-10-30 DIAGNOSIS — Z23 Encounter for immunization: Secondary | ICD-10-CM

## 2022-10-30 DIAGNOSIS — Z0000 Encounter for general adult medical examination without abnormal findings: Secondary | ICD-10-CM

## 2022-10-30 DIAGNOSIS — E1142 Type 2 diabetes mellitus with diabetic polyneuropathy: Secondary | ICD-10-CM

## 2022-10-30 DIAGNOSIS — Z794 Long term (current) use of insulin: Secondary | ICD-10-CM

## 2022-10-30 DIAGNOSIS — R634 Abnormal weight loss: Secondary | ICD-10-CM

## 2022-10-30 DIAGNOSIS — E785 Hyperlipidemia, unspecified: Secondary | ICD-10-CM

## 2022-10-30 DIAGNOSIS — Z7984 Long term (current) use of oral hypoglycemic drugs: Secondary | ICD-10-CM

## 2022-10-30 DIAGNOSIS — D649 Anemia, unspecified: Secondary | ICD-10-CM

## 2022-10-30 DIAGNOSIS — R4189 Other symptoms and signs involving cognitive functions and awareness: Secondary | ICD-10-CM

## 2022-10-30 DIAGNOSIS — F32A Depression, unspecified: Secondary | ICD-10-CM

## 2022-10-30 DIAGNOSIS — G5601 Carpal tunnel syndrome, right upper limb: Secondary | ICD-10-CM

## 2022-10-30 LAB — GLUCOSE, CAPILLARY: Glucose-Capillary: 127 mg/dL — ABNORMAL HIGH (ref 70–99)

## 2022-10-30 LAB — POCT GLYCOSYLATED HEMOGLOBIN (HGB A1C): Hemoglobin A1C: 8.4 % — AB (ref 4.0–5.6)

## 2022-10-30 MED ORDER — OLMESARTAN MEDOXOMIL 20 MG PO TABS: 20.0000 mg | ORAL_TABLET | Freq: Every day | ORAL | 3 refills | Status: DC

## 2022-10-30 NOTE — Assessment & Plan Note (Signed)
-   Weight is stable today, is 125 from 121 last time - continue Glucerna shakes - trend at every visit

## 2022-10-30 NOTE — Assessment & Plan Note (Signed)
-   chronic & stable - she has strong family support with her son and a home aide - Last MOCA was 14 in 2023

## 2022-10-30 NOTE — Assessment & Plan Note (Signed)
-   Will get outside records from her colonoscopy with [facility] - flu shot today - we did shared decision making for mammograms, given her history of breast cancer. She opted for q2 year rather than q1 year, so will order in the spring

## 2022-10-30 NOTE — Progress Notes (Signed)
Subjective:   [name] is a 72 y.o. female who presents for Medicare Annual (Subsequent) preventive examination.  Visit Complete: In person  Cardiac Risk Factors include: advanced age (>74men, >81 women);diabetes mellitus;dyslipidemia;hypertension     Objective:    Today's Vitals   10/30/22 1327  BP: (!) 104/55  Pulse: 95  Temp: 98.3 F (36.8 C)  TempSrc: Oral  SpO2: 100%  Weight: 125 lb 6.4 oz (56.9 kg)  Height: 5\' 2"  (1.575 m)   Body mass index is 22.94 kg/m.     10/30/2022    1:35 PM 10/30/2022    9:14 AM 04/10/2022    9:15 AM 01/15/2022   11:41 AM 10/30/2021    8:46 AM 10/02/2021   10:14 AM 08/02/2021    1:42 PM  Advanced Directives  Does Patient Have a Medical Advance Directive? Yes Yes Yes Yes  Yes Yes  Type of Estate agent of [employer] Power of [place];Living will Healthcare Power of [place];Living will Healthcare Power of [place];Living will Healthcare Power of [place];Living will Healthcare Power of [place];Living will Healthcare Power of [place];Living will  Does patient want to make changes to medical advance directive?  No - Patient declined No - Patient declined No - Patient declined No - Patient declined No - Patient declined No - Patient declined  Copy of Healthcare Power of Attorney in Chart?  Yes - validated most recent copy scanned in chart (See row information) Yes - validated most recent copy scanned in chart (See row information) Yes - validated most recent copy scanned in chart (See row information) Yes - validated most recent copy scanned in chart (See row information) Yes - validated most recent copy scanned in chart (See row information) Yes - validated most recent copy scanned in chart (See row information)  Would patient like information on creating a medical advance directive?       No - Patient declined    Current Medications (verified) Outpatient Encounter Medications as of 10/30/2022  Medication Sig    Accu-Chek Softclix Lancets lancets Check blood sugar 5 times a day as instructed   Blood Glucose Monitoring Suppl (ACCU-CHEK GUIDE ME) w/Device KIT Check blood sugar 5 times a day   busPIRone (BUSPAR) 5 MG tablet TAKE 1 TABLET(5 MG) BY MOUTH TWICE DAILY   Continuous Glucose Sensor (DEXCOM G7 SENSOR) MISC Replace every 10 days   Dulaglutide (TRULICITY) 0.75 MG/0.5ML SOPN INJECT THE CONTENTS OF ONE PEN  SUBCUTANEOUSLY WEEKLY AS  DIRECTED   empagliflozin (JARDIANCE) 25 MG TABS tablet TAKE 1 TABLET(25 MG) BY MOUTH DAILY   glucose 4 GM chewable tablet Chew 4 tablets (16 g total) by mouth as needed for low blood sugar.   glucose blood (ACCU-CHEK GUIDE) test strip Check blood sugar 5 times per day   glucose blood test strip OneTouch Verio test strips  USE TO CHECK BS BID   Insulin Pen Needle (BD PEN NEEDLE NANO U/F) 32G X 4 MM MISC USE AS DIRECTED   latanoprost (XALATAN) 0.005 % ophthalmic solution 1 drop at bedtime.   levothyroxine (SYNTHROID) 75 MCG tablet Take 1 tablet (75 mcg total) by mouth daily before breakfast.   metFORMIN (GLUCOPHAGE) 1000 MG tablet Take 1 tablet (1,000 mg total) by mouth 2 (two) times daily with a meal.   Nutritional Supplements (GLUCERNA SNACK SHAKE) LIQD Take 1 Dose by mouth 3 (three) times daily as needed.   olmesartan (BENICAR) 20 MG tablet Take 1 tablet (20 mg total) by mouth daily.  pioglitazone (ACTOS) 15 MG tablet Take 1 tablet (15 mg total) by mouth daily.   rosuvastatin (CRESTOR) 20 MG tablet TAKE 1 TABLET(20 MG) BY MOUTH DAILY   venlafaxine XR (EFFEXOR-XR) 150 MG 24 hr capsule TAKE 1 CAPSULE(150 MG) BY MOUTH DAILY WITH BREAKFAST   vitamin B-12 (CYANOCOBALAMIN) 500 MCG tablet Take 500 mcg by mouth daily.   No facility-administered encounter medications on file as of 10/30/2022.    Allergies (verified) Gabapentin, Meclizine hcl, Metformin hcl er, Penicillins, and Sulfa antibiotics   History: Past Medical History:  Diagnosis Date   Advanced care  planning/counseling discussion 05/29/2021   Arthritis    Bilateral hip pain 02/24/2015   Borderline glaucoma of both eyes    Depression    Diabetic polyneuropathy (HCC)    Diverticulosis of colon    Dizziness 12/05/2017   Dry eyes, bilateral    Fall 08/02/2021   Feeling of incomplete bladder emptying    Frequent falls 01/08/2018   Gait abnormality 07/19/2021   History of breast cancer oncologist-  dr Marlena Clipper-- per lov note no recurrence   dx 04/ 1999 --- Stage 3B-- s/p  chemotherapy then right mastectomy then concurrent chemoradiation therapy   History of urinary retention    01/ 2018   Hydronephrosis 04/01/2017   Hydronephrosis of right kidney    Hyperlipidemia    Hypertension    Hypokalemia 12/09/2016   Hypomagnesemia 12/09/2016   Hypothyroidism    Insulin dependent type 2 diabetes mellitus [facility])    endocrinologist-  dr Elvera Lennox---  last A1c 8.7 on 02-20-2016   Left leg pain 03/15/2019   LOW BACK PAIN 04/16/2006   Qualifier: Diagnosis of  By: Audria Nine MD, Britta Mccreedy     Lymphedema of upper extremity    right   Macular degeneration, left eye    followed by dr Ashley Royalty   Renal insufficiency    Seizure-like activity (HCC) 04/17/2019   Urgency of urination    Visual hallucinations 01/12/2019   Vitamin D deficiency 08/17/2019   Weight loss 08/17/2019   Past Surgical History:  Procedure Laterality Date   CARPAL TUNNEL RELEASE Right 2017   and tendon repair   CATARACT EXTRACTION W/ INTRAOCULAR LENS  IMPLANT, BILATERAL  2017   CYSTO/  RIGHT RETROGRADE PYELOGRAM/  UNROOFING RIGHT URETEROCELE  09/18/2000   CYSTOSCOPY/RETROGRADE/URETEROSCOPY Right 05/09/2016   Procedure: CYSTOSCOPY and right RETROGRADE;  Surgeon: Bjorn Pippin, MD;  Location: [facility];  Service: Urology;  Laterality: Right;   FINGER SURGERY Left x2 prior to 09-09-2014   I & D EXTREMITY Left 09/09/2014   Procedure: IRRIGATION AND DEBRIDEMENT LEFT THUMB DISTAL PHALANX;  Surgeon: Cindee Salt, MD;   Location: [facility];  Service: Orthopedics;  Laterality: Left;   MASTECTOMY Right 1999   w/  Node dissection's   PORT-A-CATH REMOVAL  05/01/1999   TUBAL LIGATION     VAGINAL HYSTERECTOMY  1970's   WRIST GANGLION EXCISION Right 2016   Family History  Problem Relation Age of Onset   Heart disease Father    Diabetes Father    Stroke Sister    Anuerysm Sister 32       brain; maternal half-sister   Heart disease Brother    Anuerysm Brother 1       aortic; maternal half-brother   Breast cancer Daughter 53       negative genetic testing in 2015   Heart attack Daughter        46-47   Diabetes Paternal Grandmother  Stroke Paternal Grandfather    Anuerysm Brother        NOS type; full brother   Fibroids Daughter        s/p hysterectomy at 32y   Brain cancer Maternal Uncle        dx. older than 13; NOS type   Brain cancer Cousin        maternal 1st cousin; d. early 57s; NOS type   Deafness Paternal Uncle        prelingual   Social History   Socioeconomic History   Marital status: Widowed    Spouse name: Not on file   Number of children: Not on file   Years of education: Not on file   Highest education level: Not on file  Occupational History   Not on file  Tobacco Use   Smoking status: Never   Smokeless tobacco: Never  Vaping Use   Vaping status: Never Used  Substance and Sexual Activity   Alcohol use: No    Alcohol/week: 0.0 standard drinks of alcohol   Drug use: No   Sexual activity: Not on file  Other Topics Concern   Not on file  Social History Narrative   Not on file   Social Determinants of Health   Financial Resource Strain: Low Risk  (10/30/2022)   Overall Financial Resource Strain (CARDIA)    Difficulty of Paying Living Expenses: Not hard at all  Food Insecurity: No Food Insecurity (10/30/2022)   Hunger Vital Sign    Worried About Running Out of Food in the Last Year: Never true    Ran Out of Food in the Last Year: Never true   Transportation Needs: No Transportation Needs (06/07/2021)   PRAPARE - Administrator, Civil Service (Medical): No    Lack of Transportation (Non-Medical): No  Physical Activity: Inactive (10/30/2022)   Exercise Vital Sign    Days of Exercise per Week: 0 days    Minutes of Exercise per Session: 0 min  Stress: No Stress Concern Present (10/30/2022)   [employer] of Occupational Health - Occupational Stress Questionnaire    Feeling of Stress : Not at all  Social Connections: Socially Isolated (10/30/2022)   Social Connection and Isolation Panel [NHANES]    Frequency of Communication with Friends and Family: More than three times a week    Frequency of Social Gatherings with Friends and Family: Three times a week    Attends Religious Services: Never    Active Member of Clubs or Organizations: No    Attends Banker Meetings: Never    Marital Status: Divorced    Tobacco Counseling Counseling given: Not Answered   Clinical Intake:  Pre-visit preparation completed: Yes  Pain : No/denies pain     BMI - recorded: 22 Nutritional Status: BMI of 19-24  Normal Nutritional Risks: None Diabetes: Yes CBG done?: Yes CBG resulted in Enter/ Edit results?: Yes Did pt. bring in CBG monitor from home?: No  How often do you need to have someone help you when you read instructions, pamphlets, or other written materials from your doctor or pharmacy?: 4 - Often What is the last grade level you completed in school?: GED  Interpreter Needed?: No  Information entered by :: [facility]   Activities of Daily Living    10/30/2022    1:29 PM 04/10/2022    9:14 AM  In your present state of health, do you have any difficulty performing the following activities:  Hearing? 0  0  Vision? 0 0  Difficulty concentrating or making decisions? 0 0  Walking or climbing stairs? 0 0  Dressing or bathing? 0 0  Doing errands, shopping? 0 0  Preparing Food and eating ? N    Using the Toilet? N   In the past six months, have you accidently leaked urine? N   Do you have problems with loss of bowel control? N   Managing your Medications? N   Managing your Finances? N   Housekeeping or managing your Housekeeping? N     Patient Care Team: Mercie Eon, MD as PCP - General (Internal Medicine) Sherrie George, MD as Attending Physician (Ophthalmology) Helane Gunther, DPM as Consulting Physician (Podiatry) Bjorn Pippin, MD as Attending Physician (Urology)  Indicate any recent Medical Services you may have received from other than [facility] providers in the past year (date may be approximate).     Assessment:   This is a routine wellness examination for [name].  Hearing/Vision screen No results found.   Goals Addressed   None   Depression Screen    10/30/2022    9:15 AM 04/10/2022    9:14 AM 01/15/2022   11:39 AM 01/15/2022    9:28 AM 10/30/2021    9:39 AM 10/02/2021   12:25 PM 08/28/2021   10:25 AM  PHQ 2/9 Scores  PHQ - 2 Score 0 0 1 0 0 0 0  PHQ- 9 Score   2    3    Fall Risk    10/30/2022    1:36 PM 10/30/2022    8:41 AM 04/10/2022    9:14 AM 01/15/2022    9:28 AM 10/30/2021    8:45 AM  Fall Risk   Falls in the past year? 0 0 0 0 1  Number falls in past yr: 0 0 0 0 1  Injury with Fall? 0 0 0 0 1  Risk for fall due to : Impaired balance/gait  Impaired balance/gait  Impaired balance/gait  Follow up Falls prevention discussed;Falls evaluation completed Falls evaluation completed Falls evaluation completed Falls evaluation completed Falls evaluation completed    MEDICARE RISK AT HOME: Medicare Risk at Home Any stairs in or around the home?: Yes If so, are there any without handrails?: No Home free of loose throw rugs in walkways, pet beds, electrical cords, etc?: Yes Adequate lighting in your home to reduce risk of falls?: Yes Life alert?: No Use of a cane, walker or w/c?: Yes Grab bars in the bathroom?: No Shower chair or bench in shower?:  Yes Elevated toilet seat or a handicapped toilet?: Yes  TIMED UP AND GO:  Was the test performed?  N/A    Cognitive Function:    07/19/2021    4:00 PM  MMSE - Mini Mental State Exam  Orientation to time 0  Orientation to Place 4  Registration 3  Attention/ Calculation 3  Recall 0  Language- name 2 objects 2  Language- repeat 1  Language- follow 3 step command 2  Language- read & follow direction 1  Write a sentence 1  Copy design 0  Total score 17        10/30/2022    1:38 PM 06/07/2021    2:56 PM  6CIT Screen  What Year? -- 4 points  What month?  3 points  What time?  3 points  Count back from 20  0 points  Months in reverse  4 points  Repeat phrase  2 points  Total Score  16 points    Immunizations Immunization History  Administered Date(s) Administered   Fluad Quad(high Dose 65+) 12/07/2020, 10/02/2021   Fluad Trivalent(High Dose 65+) 10/30/2022   Influenza Split 11/12/2011   Influenza Whole 12/30/2005   Influenza, High Dose Seasonal PF 11/02/2016   Influenza,inj,Quad PF,6+ Mos 11/10/2012, 10/21/2013, 10/18/2014, 10/03/2015, 10/14/2017, 10/13/2018, 10/19/2019   Influenza,inj,Quad PF,6-35 Mos 11/02/2016   Influenza,inj,quad, With Preservative 12/19/2016   Moderna Sars-Covid-2 Vaccination 06/19/2019, 07/17/2019   Pneumococcal Conjugate-13 10/18/2015   Pneumococcal Polysaccharide-23 02/12/2005, 08/13/2012, 10/21/2017   Td 09/29/2003   Tdap 12/09/2013    TDAP status: Up to date  Flu Vaccine status: Up to date  Pneumococcal vaccine status: Up to date  Covid-19 vaccine status: Completed vaccines  Qualifies for Shingles Vaccine? Yes   Zostavax completed No   Shingrix Completed?: No.    Education has been provided regarding the importance of this vaccine. Patient has been advised to call insurance company to determine out of pocket expense if they have not yet received this vaccine. Advised may also receive vaccine at local pharmacy or Health Dept.  Verbalized acceptance and understanding.  Screening Tests Health Maintenance  Topic Date Due   Zoster Vaccines- Shingrix (1 of 2) Never done   Colonoscopy  02/05/2022   OPHTHALMOLOGY EXAM  09/28/2022   COVID-19 Vaccine (3 - 2023-24 season) 09/29/2022   LIPID PANEL  10/03/2022   Diabetic kidney evaluation - eGFR measurement  01/16/2023   FOOT EXAM  01/16/2023   HEMOGLOBIN A1C  01/30/2023   Diabetic kidney evaluation - Urine ACR  07/10/2023   MAMMOGRAM  08/03/2023   Medicare Annual Wellness (AWV)  10/30/2023   DTaP/Tdap/Td (3 - Td or Tdap) 12/10/2023   Pneumonia Vaccine 63+ Years old  Completed   INFLUENZA VACCINE  Completed   DEXA SCAN  Completed   Hepatitis C Screening  Completed   HPV VACCINES  Aged Out    Health Maintenance  Health Maintenance Due  Topic Date Due   Zoster Vaccines- Shingrix (1 of 2) Never done   Colonoscopy  02/05/2022   OPHTHALMOLOGY EXAM  09/28/2022   COVID-19 Vaccine (3 - 2023-24 season) 09/29/2022   LIPID PANEL  10/03/2022      Mammogram status: Completed 07/03/2021. Repeat every  2 year  Bone Density status: Completed 06/06/2016. Results reflect: Bone density results: NORMAL. Repeat every 2 years.  Lung Cancer Screening: (Low Dose CT Chest recommended if Age 96-80 years, 20 pack-year currently smoking OR have quit w/in 15years.) does not qualify.   Lung Cancer Screening Referral: DEFERRED  TO PCP   Additional Screening:  Hepatitis C Screening: does qualify; Completed 10/18/2014  Vision Screening: Recommended annual ophthalmology exams for early detection of glaucoma and other disorders of the eye. Is the patient up to date with their annual eye exam?  Yes  Who is the provider or what is the name of the office in which the patient attends annual eye exams? [facility]  If pt is not established with a provider, would they like to be referred to a provider to establish care? No .   Dental Screening: Recommended annual dental exams for proper  oral hygiene  Diabetic Foot Exam: Diabetic Foot Exam: Completed 01/15/2022  Community Resource Referral / Chronic Care Management: CRR required this visit?  No   CCM required this visit?  No     Plan:     I have personally reviewed and noted the following in the patient's chart:   Medical and social history  Use of alcohol, tobacco or illicit drugs  Current medications and supplements including opioid prescriptions. Patient is not currently taking opioid prescriptions. Functional ability and status Nutritional status Physical activity Advanced directives List of other physicians Hospitalizations, surgeries, and ER visits in previous 12 months Vitals Screenings to include cognitive, depression, and falls Referrals and appointments  In addition, I have reviewed and discussed with patient certain preventive protocols, quality metrics, and best practice recommendations. A written personalized care plan for preventive services as well as general preventive health recommendations were provided to patient.     Derrell Lolling, CMA   10/30/2022   After Visit Summary: (In Person-Printed) AVS printed and given to the patient  Nurse Notes: IN PERSON  [facility]   [name] , Thank you for taking time to come for your Medicare Wellness Visit. I appreciate your ongoing commitment to your health goals. Please review the following plan we discussed and let me know if I can assist you in the future.   These are the goals we discussed:  Goals       Blood Pressure < 140/90      BP Readings from Last 3 Encounters:  05/23/20 138/71  10/19/19 128/77  08/17/19 (!) 89/55   **RNCM attempting to get patient a home BP monitor  Not meeting targets for SBP      HEMOGLOBIN A1C < 7.0      Lab Results  Component Value Date   HGBA1C 8.5 (A) 05/23/2020     Not meeting Hgb A1C target      HEMOGLOBIN A1C < 7.0      LDL CALC < 100      Lab Results  Component Value Date   CHOL 105 05/23/2020    HDL 47 05/23/2020   LDLCALC 43 05/23/2020   TRIG 69 05/23/2020   CHOLHDL 2.2 05/23/2020    Not meeting LDL and total cholesterol target      Set My Target A1C-Diabetes Type 2- " My son [name] checks my blood sugar and makes sure I eat the right kind of foods." (pt-stated)      Timeframe:  Long-Range Goal Priority:  High Start Date:       05/24/19                      Expected End Date: ongoing                   Follow Up Date    - set target A1C    Why is this important?   Your target A1C is decided together by you and your doctor.  It is based on several things like your age and other health issues.    Notes:         This is a list of the screening recommended for you and due dates:  Health Maintenance  Topic Date Due   Zoster (Shingles) Vaccine (1 of 2) Never done   Colon Cancer Screening  02/05/2022   Eye exam for diabetics  09/28/2022   COVID-19 Vaccine (3 - 2023-24 season) 09/29/2022   Lipid (cholesterol) test  10/03/2022   Yearly kidney function blood test for diabetes  01/16/2023   Complete foot exam   01/16/2023   Hemoglobin A1C  01/30/2023   Yearly kidney health urinalysis for diabetes  07/10/2023   Mammogram  08/03/2023   Medicare Annual Wellness Visit  10/30/2023   DTaP/Tdap/Td vaccine (3 - Td or Tdap) 12/10/2023  Pneumonia Vaccine  Completed   Flu Shot  Completed   DEXA scan (bone density measurement)  Completed   Hepatitis C Screening  Completed   HPV Vaccine  Aged Out

## 2022-10-30 NOTE — Assessment & Plan Note (Signed)
-   A1C from 7.3 to 8.4 today, my goal for her is around 8  - I reviewed her CGM and she's had 44% high and 56% in range, with no lows. I'm okay with this, I really want to avoid lows in her - Continue Metformin 1g BID, Trulicity 0.75mg  weekly, Jardiance 25mg  daily, Actos 15mg  daily  - If weight declines, will stop Trulicity

## 2022-10-30 NOTE — Assessment & Plan Note (Signed)
-   Blood pressure was low today, she is not symptomatic - Stop Azor combo pill. Continue Olmesartan 20mg  daily monotherapy. Repeat blood pressure next visit.

## 2022-10-30 NOTE — Patient Instructions (Addendum)
Dear Ms Kagel,  It was a pleasure seeing you in clinic today.  I made 1 change to your blood pressure medicines because your blood pressure was a little bit low. You were taking Azor (a combination pill with 2 medicines in it - Amlodipine & Olmesartan). Stop taking the Azor combination pill. I have sent in a new medicine (Olmesartan only) to your [facility] pharmacy, and this will be mailed to you.   I'll see you back in 3 months, and we will recheck your labs at that visit.  We will give you a flu shot today.   Sincerely, Dr. Mercie Eon

## 2022-10-30 NOTE — Assessment & Plan Note (Signed)
-   chronic and stable - continue rosuvastatin 20mg  daily

## 2022-10-30 NOTE — Assessment & Plan Note (Signed)
-   chronic & stable - prior iron studies consistent with anemia of chronic disease - repeat CBC at next visit

## 2022-10-30 NOTE — Assessment & Plan Note (Signed)
-   chronic & stable - continue levothyroxine daily - TSH at next visit

## 2022-10-30 NOTE — Patient Instructions (Signed)

## 2022-10-30 NOTE — Assessment & Plan Note (Signed)
-   chronic & stable - mood is good today - Continue Venlafaxine 150mg  daily & Buspar 5mg  BID

## 2022-10-30 NOTE — Assessment & Plan Note (Signed)
-   Patient is not interested in surgery (Dr. Roda Shutters) right now, so will continue wrist splint as needed

## 2022-11-04 ENCOUNTER — Telehealth: Payer: Self-pay | Admitting: *Deleted

## 2022-11-04 NOTE — Telephone Encounter (Signed)
Received a call from [employer] with [facility]. They are changing drug supplier and they want to change pt's Levothyroxine mfg from Amneal to Lupin. Ok given - sending to PCP for approval or denial. Reference # [id number] Telephone# [phone].

## 2022-11-06 ENCOUNTER — Encounter: Payer: Self-pay | Admitting: Podiatry

## 2022-11-06 ENCOUNTER — Ambulatory Visit (INDEPENDENT_AMBULATORY_CARE_PROVIDER_SITE_OTHER): Payer: 59 | Admitting: Podiatry

## 2022-11-06 DIAGNOSIS — B351 Tinea unguium: Secondary | ICD-10-CM | POA: Diagnosis not present

## 2022-11-06 DIAGNOSIS — E1142 Type 2 diabetes mellitus with diabetic polyneuropathy: Secondary | ICD-10-CM | POA: Diagnosis not present

## 2022-11-06 DIAGNOSIS — M79675 Pain in left toe(s): Secondary | ICD-10-CM

## 2022-11-06 DIAGNOSIS — M79674 Pain in right toe(s): Secondary | ICD-10-CM | POA: Diagnosis not present

## 2022-11-06 NOTE — Progress Notes (Signed)
This patient returns to my office for at risk foot care.  This patient requires this care by a professional since this patient will be at risk due to having diabetic neuropathy.This patient is unable to cut nails herself since the patient cannot reach her nails.These nails are painful walking and wearing shoes.  This patient presents for at risk foot care today.  General Appearance  Alert, conversant and in no acute stress.  Vascular  Dorsalis pedis and posterior tibial  pulses are palpable  bilaterally.  Capillary return is within normal limits  bilaterally. Temperature is within normal limits  bilaterally.  Neurologic  Senn-Weinstein monofilament wire test within normal limits  bilaterally. Muscle power within normal limits bilaterally.  Nails Thick disfigured discolored nails with subungual debris  from hallux to fifth toes bilaterally. No evidence of bacterial infection or drainage bilaterally.  Orthopedic  No limitations of motion  feet .  No crepitus or effusions noted.  No bony pathology or digital deformities noted.  Skin  normotropic skin with no porokeratosis noted bilaterally.  No signs of infections or ulcers noted.   HD 5th  B/L.  Onychomycosis  Pain in right toes  Pain in left toes  Consent was obtained for treatment procedures.   Mechanical debridement of nails 1-5  bilaterally performed with a nail nipper.  Filed with dremel without incident.    Return office visit   3 months                   Told patient to return for periodic foot care and evaluation due to potential at risk complications.   Helane Gunther DPM

## 2022-11-11 ENCOUNTER — Other Ambulatory Visit: Payer: Self-pay | Admitting: Internal Medicine

## 2022-11-11 DIAGNOSIS — F32A Depression, unspecified: Secondary | ICD-10-CM

## 2022-12-02 LAB — OPHTHALMOLOGY REPORT-SCANNED

## 2022-12-03 ENCOUNTER — Other Ambulatory Visit: Payer: Self-pay

## 2022-12-03 DIAGNOSIS — F32A Depression, unspecified: Secondary | ICD-10-CM

## 2022-12-03 MED ORDER — VENLAFAXINE HCL ER 150 MG PO CP24
150.0000 mg | ORAL_CAPSULE | Freq: Every day | ORAL | 3 refills | Status: DC
Start: 2022-12-03 — End: 2023-03-17

## 2022-12-31 NOTE — Telephone Encounter (Signed)
done

## 2023-01-06 ENCOUNTER — Other Ambulatory Visit: Payer: Self-pay

## 2023-01-06 DIAGNOSIS — Z794 Long term (current) use of insulin: Secondary | ICD-10-CM

## 2023-01-06 MED ORDER — EMPAGLIFLOZIN 25 MG PO TABS: 25.0000 mg | ORAL_TABLET | Freq: Every day | ORAL | 3 refills | Status: DC

## 2023-01-06 NOTE — Telephone Encounter (Signed)
Medication sent to pharmacy  

## 2023-01-16 ENCOUNTER — Other Ambulatory Visit: Payer: Self-pay

## 2023-01-16 DIAGNOSIS — E1142 Type 2 diabetes mellitus with diabetic polyneuropathy: Secondary | ICD-10-CM

## 2023-01-16 MED ORDER — ROSUVASTATIN CALCIUM 20 MG PO TABS: 20.0000 mg | ORAL_TABLET | Freq: Every day | ORAL | 3 refills | Status: DC

## 2023-01-16 NOTE — Telephone Encounter (Signed)
Medication sent to pharmacy  

## 2023-01-16 NOTE — Addendum Note (Signed)
Addended by: Cala Bradford on: 01/16/2023 08:38 AM   Modules accepted: Orders

## 2023-02-10 ENCOUNTER — Encounter: Payer: Self-pay | Admitting: Podiatry

## 2023-02-10 ENCOUNTER — Ambulatory Visit (INDEPENDENT_AMBULATORY_CARE_PROVIDER_SITE_OTHER): Payer: 59 | Admitting: Podiatry

## 2023-02-10 DIAGNOSIS — M79675 Pain in left toe(s): Secondary | ICD-10-CM | POA: Diagnosis not present

## 2023-02-10 DIAGNOSIS — M79674 Pain in right toe(s): Secondary | ICD-10-CM

## 2023-02-10 DIAGNOSIS — E1142 Type 2 diabetes mellitus with diabetic polyneuropathy: Secondary | ICD-10-CM | POA: Diagnosis not present

## 2023-02-10 DIAGNOSIS — B351 Tinea unguium: Secondary | ICD-10-CM | POA: Diagnosis not present

## 2023-02-10 NOTE — Progress Notes (Signed)
 This patient returns to my office for at risk foot care.  This patient requires this care by a professional since this patient will be at risk due to having diabetic neuropathy.This patient is unable to cut nails herself since the patient cannot reach her nails.These nails are painful walking and wearing shoes.  This patient presents for at risk foot care today.  General Appearance  Alert, conversant and in no acute stress.  Vascular  Dorsalis pedis and posterior tibial  pulses are palpable  bilaterally.  Capillary return is within normal limits  bilaterally. Temperature is within normal limits  bilaterally.  Neurologic  Senn-Weinstein monofilament wire test within normal limits  bilaterally. Muscle power within normal limits bilaterally.  Nails Thick disfigured discolored nails with subungual debris  from hallux to fifth toes bilaterally. No evidence of bacterial infection or drainage bilaterally.  Orthopedic  No limitations of motion  feet .  No crepitus or effusions noted.  No bony pathology or digital deformities noted.  Skin  normotropic skin with no porokeratosis noted bilaterally.  No signs of infections or ulcers noted.   HD 5th  B/L asymptomatic.  Onychomycosis  Pain in right toes  Pain in left toes  Consent was obtained for treatment procedures.   Mechanical debridement of nails 1-5  bilaterally performed with a nail nipper.  Filed with dremel without incident.    Return office visit   3 months                   Told patient to return for periodic foot care and evaluation due to potential at risk complications.   Cordella Bold DPM

## 2023-02-12 ENCOUNTER — Encounter: Payer: 59 | Admitting: Internal Medicine

## 2023-02-12 ENCOUNTER — Ambulatory Visit: Payer: 59 | Admitting: Internal Medicine

## 2023-02-12 ENCOUNTER — Encounter: Payer: Self-pay | Admitting: Internal Medicine

## 2023-02-12 VITALS — BP 106/59 | HR 87 | Temp 98.2°F | Ht 62.0 in | Wt 130.0 lb

## 2023-02-12 DIAGNOSIS — E785 Hyperlipidemia, unspecified: Secondary | ICD-10-CM

## 2023-02-12 DIAGNOSIS — I10 Essential (primary) hypertension: Secondary | ICD-10-CM | POA: Diagnosis not present

## 2023-02-12 DIAGNOSIS — Z794 Long term (current) use of insulin: Secondary | ICD-10-CM

## 2023-02-12 DIAGNOSIS — E1142 Type 2 diabetes mellitus with diabetic polyneuropathy: Secondary | ICD-10-CM | POA: Diagnosis not present

## 2023-02-12 LAB — GLUCOSE, CAPILLARY: Glucose-Capillary: 167 mg/dL — ABNORMAL HIGH (ref 70–99)

## 2023-02-12 LAB — POCT GLYCOSYLATED HEMOGLOBIN (HGB A1C): Hemoglobin A1C: 8.6 % — AB (ref 4.0–5.6)

## 2023-02-12 MED ORDER — OLMESARTAN MEDOXOMIL 20 MG PO TABS: 20.0000 mg | ORAL_TABLET | Freq: Every day | ORAL | 3 refills | Status: DC

## 2023-02-12 NOTE — Assessment & Plan Note (Signed)
 Patient is taking Crestor  20 mg daily for primary prevention.  Rechecking lipid panel today.

## 2023-02-12 NOTE — Patient Instructions (Signed)
 [name],  It was a pleasure to meet you. Keep up the great work with your diabetes and hypertension, continue to take all of your medicines as previously prescribed. I will call your son when your labs result. Please schedule follow up with Dr. Jarvis Mesa in 3 months.   If you have any questions or concerns, call our clinic at [phone] or after hours call [phone] and ask for the internal medicine resident on call.   Thank you!  Dr. Jesscia Imm

## 2023-02-12 NOTE — Progress Notes (Signed)
 Subjective:   Patient ID: [name] female   DOB: 03-Nov-1950 73 y.o.   MRN: [id number]  HPI: [name] is a 73 y.o. female with past medical history outlined below here for HTN and DM follow up. For further details of today's visit, please refer to the assessment and plan below.   Past Medical History:  Diagnosis Date   Advanced care planning/counseling discussion 05/29/2021   Arthritis    Bilateral hip pain 02/24/2015   Borderline glaucoma of both eyes    Depression    Diabetic polyneuropathy (HCC)    Diverticulosis of colon    Dizziness 12/05/2017   Dry eyes, bilateral    Fall 08/02/2021   Feeling of incomplete bladder emptying    Frequent falls 01/08/2018   Gait abnormality 07/19/2021   History of breast cancer oncologist-  dr Jeanenne Mile-- per lov note no recurrence   dx 04/ 1999 --- Stage 3B-- s/p  chemotherapy then right mastectomy then concurrent chemoradiation therapy   History of urinary retention    01/ 2018   Hydronephrosis 04/01/2017   Hydronephrosis of right kidney    Hyperlipidemia    Hypertension    Hypokalemia 12/09/2016   Hypomagnesemia 12/09/2016   Hypothyroidism    Insulin  dependent type 2 diabetes mellitus [facility])    endocrinologist-  dr Aldona Amel---  last A1c 8.7 on 02-20-2016   Left leg pain 03/15/2019   LOW BACK PAIN 04/16/2006   Qualifier: Diagnosis of  By: Cathlene Coad MD, Felipa Horsfall     Lymphedema of upper extremity    right   Macular degeneration, left eye    followed by dr Augustus Ledger   Renal insufficiency    Seizure-like activity (HCC) 04/17/2019   Urgency of urination    Visual hallucinations 01/12/2019   Vitamin D  deficiency 08/17/2019   Weight loss 08/17/2019   Current Outpatient Medications  Medication Sig Dispense Refill   Accu-Chek Softclix Lancets lancets Check blood sugar 5 times a day as instructed 200 each 10   Blood Glucose Monitoring Suppl (ACCU-CHEK GUIDE ME) w/Device KIT Check blood sugar 5 times a day 1 kit 1   busPIRone   (BUSPAR ) 5 MG tablet TAKE 1 TABLET(5 MG) BY MOUTH TWICE DAILY 180 tablet 3   Continuous Glucose Sensor (DEXCOM G7 SENSOR) MISC Replace every 10 days 9 each 3   Dulaglutide  (TRULICITY ) 0.75 MG/0.5ML SOPN INJECT THE CONTENTS OF ONE PEN  SUBCUTANEOUSLY WEEKLY AS  DIRECTED 4 mL 5   empagliflozin  (JARDIANCE ) 25 MG TABS tablet Take 1 tablet (25 mg total) by mouth daily. 90 tablet 3   glucose 4 GM chewable tablet Chew 4 tablets (16 g total) by mouth as needed for low blood sugar. 50 tablet 12   glucose blood (ACCU-CHEK GUIDE) test strip Check blood sugar 5 times per day 150 each 11   glucose blood test strip OneTouch Verio test strips  USE TO CHECK BS BID     Insulin  Pen Needle (BD PEN NEEDLE NANO U/F) 32G X 4 MM MISC USE AS DIRECTED 100 each 6   latanoprost (XALATAN) 0.005 % ophthalmic solution 1 drop at bedtime.     levothyroxine  (SYNTHROID ) 75 MCG tablet Take 1 tablet (75 mcg total) by mouth daily before breakfast. 100 tablet 2   metFORMIN  (GLUCOPHAGE ) 1000 MG tablet Take 1 tablet (1,000 mg total) by mouth 2 (two) times daily with a meal. 180 tablet 3   Nutritional Supplements (GLUCERNA SNACK SHAKE) LIQD Take 1 Dose by mouth 3 (three) times daily as needed. 30 Bottle  3   olmesartan  (BENICAR ) 20 MG tablet Take 1 tablet (20 mg total) by mouth daily. 90 tablet 3   pioglitazone  (ACTOS ) 15 MG tablet Take 1 tablet (15 mg total) by mouth daily. 90 tablet 3   rosuvastatin  (CRESTOR ) 20 MG tablet Take 1 tablet (20 mg total) by mouth daily. 90 tablet 3   venlafaxine  XR (EFFEXOR -XR) 150 MG 24 hr capsule Take 1 capsule (150 mg total) by mouth daily with breakfast. 90 capsule 3   vitamin B-12 (CYANOCOBALAMIN) 500 MCG tablet Take 500 mcg by mouth daily.     No current facility-administered medications for this visit.   Family History  Problem Relation Age of Onset   Heart disease Father    Diabetes Father    Stroke Sister    Anuerysm Sister 28       brain; maternal half-sister   Heart disease Brother     Anuerysm Brother 64       aortic; maternal half-brother   Breast cancer Daughter 36       negative genetic testing in 2015   Heart attack Daughter        46-47   Diabetes Paternal Grandmother    Stroke Paternal Grandfather    Anuerysm Brother        NOS type; full brother   Fibroids Daughter        s/p hysterectomy at 45y   Brain cancer Maternal Uncle        dx. older than 34; NOS type   Brain cancer Cousin        maternal 1st cousin; d. early 4s; NOS type   Deafness Paternal Uncle        prelingual   Social History   Socioeconomic History   Marital status: Widowed    Spouse name: Not on file   Number of children: Not on file   Years of education: Not on file   Highest education level: Not on file  Occupational History   Not on file  Tobacco Use   Smoking status: Never   Smokeless tobacco: Never  Vaping Use   Vaping status: Never Used  Substance and Sexual Activity   Alcohol  use: No    Alcohol /week: 0.0 standard drinks of alcohol    Drug use: No   Sexual activity: Not on file  Other Topics Concern   Not on file  Social History Narrative   Not on file   Social Drivers of Health   Financial Resource Strain: Low Risk  (10/30/2022)   Overall Financial Resource Strain (CARDIA)    Difficulty of Paying Living Expenses: Not hard at all  Food Insecurity: No Food Insecurity (10/30/2022)   Hunger Vital Sign    Worried About Running Out of Food in the Last Year: Never true    Ran Out of Food in the Last Year: Never true  Transportation Needs: No Transportation Needs (06/07/2021)   PRAPARE - Administrator, Civil Service (Medical): No    Lack of Transportation (Non-Medical): No  Physical Activity: Inactive (10/30/2022)   Exercise Vital Sign    Days of Exercise per Week: 0 days    Minutes of Exercise per Session: 0 min  Stress: No Stress Concern Present (10/30/2022)   [employer] of Occupational Health - Occupational Stress Questionnaire    Feeling of  Stress : Not at all  Social Connections: Socially Isolated (10/30/2022)   Social Connection and Isolation Panel [NHANES]    Frequency of Communication with Friends and  Family: More than three times a week    Frequency of Social Gatherings with Friends and Family: Three times a week    Attends Religious Services: Never    Active Member of Clubs or Organizations: No    Attends Banker Meetings: Never    Marital Status: Divorced     Objective:  Physical Exam:  Vitals:   02/12/23 0859  BP: (!) 106/59  Pulse: 87  Temp: 98.2 F (36.8 C)  TempSrc: Oral  SpO2: 100%  Weight: 130 lb (59 kg)  Height: 5\' 2"  (1.575 m)    Constitutional: well groomed, NAD Cardiovascular: RRR Pulmonary/Chest: Clear, normal effort Extremities: Warm, bilateral non pitting edema above the ankles   Assessment & Plan:   Essential hypertension Blood pressure continues to be on the low side, was recently de-escalated from Azor  combination pill to olmesartan  20 mg alone.  Son reports she has been taking this daily and has no side effects.  She denies symptoms of dizziness or hypotension.  Dpes not check her blood pressure at home.  Has not yet taken her medications this morning.  I went ahead and refilled this medication but advised her son to pick up a blood pressure cuff for ambulatory monitoring especially if she becomes symptomatic.  If blood pressure continues to be low consider monitoring off treatment. Checking BMP today.   Type 2 diabetes mellitus with diabetic polyneuropathy, with long-term current use of insulin  (HCC) Hemoglobin A1c continues to be above her goal of less than 8.  I reviewed her Dexcom readings.  She is in range 46% of the time, above range 34% of the time, and very high 20% of the time.  She has had no episodes of hypoglycemia.  Her nighttime readings are within target range with a pattern of postprandial hyperglycemia after lunch and dinner.  Son admits to some dietary  indiscretion around the holidays. I have opted not to change any of her medications today.  Continue with continuous glucose monitoring, current medications include metformin , pioglitazone , Jardiance , and Trulicity .  Her weight has been stable.  Follow-up in 3 months with Dr. Jarvis Mesa for repeat hemoglobin A1c check.  Checking urine microalbumin today.  Hyperlipidemia Patient is taking Crestor  20 mg daily for primary prevention.  Rechecking lipid panel today.

## 2023-02-12 NOTE — Assessment & Plan Note (Addendum)
 Blood pressure continues to be on the low side, was recently de-escalated from Azor  combination pill to olmesartan  20 mg alone.  Son reports she has been taking this daily and has no side effects.  She denies symptoms of dizziness or hypotension.  Dpes not check her blood pressure at home.  Has not yet taken her medications this morning.  I went ahead and refilled this medication but advised her son to pick up a blood pressure cuff for ambulatory monitoring especially if she becomes symptomatic.  If blood pressure continues to be low consider monitoring off treatment. Checking BMP today.

## 2023-02-12 NOTE — Assessment & Plan Note (Addendum)
 Hemoglobin A1c continues to be above her goal of less than 8.  I reviewed her Dexcom readings.  She is in range 46% of the time, above range 34% of the time, and very high 20% of the time.  She has had no episodes of hypoglycemia.  Her nighttime readings are within target range with a pattern of postprandial hyperglycemia after lunch and dinner.  Son admits to some dietary indiscretion around the holidays. I have opted not to change any of her medications today.  Continue with continuous glucose monitoring, current medications include metformin , pioglitazone , Jardiance , and Trulicity .  Her weight has been stable.  Follow-up in 3 months with Dr. Jarvis Mesa for repeat hemoglobin A1c check.  Checking urine microalbumin today.

## 2023-02-14 LAB — MICROALBUMIN / CREATININE URINE RATIO
Creatinine, Urine: 41.9 mg/dL
Microalb/Creat Ratio: 25 mg/g{creat} (ref 0–29)
Microalbumin, Urine: 10.4 ug/mL

## 2023-03-04 ENCOUNTER — Ambulatory Visit (INDEPENDENT_AMBULATORY_CARE_PROVIDER_SITE_OTHER): Payer: 59 | Admitting: Student

## 2023-03-04 DIAGNOSIS — N3946 Mixed incontinence: Secondary | ICD-10-CM | POA: Diagnosis not present

## 2023-03-04 NOTE — Progress Notes (Signed)
 [facility] Internal Medicine Residency Telephone Encounter Continuity Care Appointment  HPI:  This telephone encounter was created for [name] on 03/05/2023 for the following purpose/cc urinary incontinence supplies.   Past Medical History:  Past Medical History:  Diagnosis Date   Advanced care planning/counseling discussion 05/29/2021   Arthritis    Bilateral hip pain 02/24/2015   Borderline glaucoma of both eyes    Depression    Diabetic polyneuropathy (HCC)    Diverticulosis of colon    Dizziness 12/05/2017   Dry eyes, bilateral    Fall 08/02/2021   Feeling of incomplete bladder emptying    Frequent falls 01/08/2018   Gait abnormality 07/19/2021   History of breast cancer oncologist-  dr vivien-- per lov note no recurrence   dx 04/ 1999 --- Stage 3B-- s/p  chemotherapy then right mastectomy then concurrent chemoradiation therapy   History of urinary retention    01/ 2018   Hydronephrosis 04/01/2017   Hydronephrosis of right kidney    Hyperlipidemia    Hypertension    Hypokalemia 12/09/2016   Hypomagnesemia 12/09/2016   Hypothyroidism    Insulin  dependent type 2 diabetes mellitus [facility])    endocrinologist-  dr trixie---  last A1c 8.7 on 02-20-2016   Left leg pain 03/15/2019   LOW BACK PAIN 04/16/2006   Qualifier: Diagnosis of  By: Elizabeth MD, Heron     Lymphedema of upper extremity    right   Macular degeneration, left eye    followed by dr alvia   Renal insufficiency    Seizure-like activity (HCC) 04/17/2019   Urgency of urination    Visual hallucinations 01/12/2019   Vitamin D  deficiency 08/17/2019   Weight loss 08/17/2019     ROS:  Negative for dysuria, hematuria, or changes in urinary frequency.   Assessment / Plan / Recommendations:  Please see A&P under problem oriented charting for assessment of the patient's acute and chronic medical conditions.  As always, pt is advised that if symptoms worsen or new symptoms arise, they should  go to an urgent care facility or to to ER for further evaluation.  Urinary incontinence, mixed Given patient's history of cognitive impairment, patient's son [name] assisted in providing supplemental information. Patient has history of mixed urinary incontinence. Feels an urge to go, also feels like urine just comes out, indicating a presence of both urgency and overflow incontinence. Can use bathroom but supplies help patient minimize going on herself and requiring additional assistance to get cleaned up. Denies any fevers, dysuria, hematuria, or changes in frequency since last visit. Patient/patient's son did not share any concerns for localized pain (e.g., suprapubic, flank pain) or infection at this time. Patient has been receiving urinary incontinence supplies for some time, which have been very helpful to her avoiding having accidents. Has had some sporadic incidents of fecal incontinence. Overall satisfied with the use of adult diapers/pull ups.   Will place DME order again as patient continues to benefit from incontinence supply to help address mixed urinary incontinence and improve her quality of life. Patient to continue having regular follow ups for chronic medical concerns and healthcare maintenance.     Consent and Medical Decision Making:  Patient discussed with Dr. Jerrell This is a telephone encounter between [name] with assistance from her son, [name], and Kross Swallows Arellano Zameza on 03/05/2023 for urinary incontinence supplies. The visit was conducted with the patient located at home and Jamila Slatten Arellano Zameza at [facility]. The patient's identity was confirmed using  their DOB and current address. The patient has consented to being evaluated through a telephone encounter and understands the associated risks (an examination cannot be done and the patient may need to come in for an appointment) / benefits (allows the patient to remain at home, decreasing exposure to coronavirus). I  personally spent 15 minutes on medical discussion.

## 2023-03-05 NOTE — Addendum Note (Signed)
 Addended by: Juel Nutley T on: 03/05/2023 04:15 PM   Modules accepted: Level of Service

## 2023-03-05 NOTE — Progress Notes (Signed)
 Internal Medicine Clinic Attending  Case discussed with the resident physician at the time of the visit.  We reviewed the patient's history, exam, and pertinent patient test results.  I agree with the assessment, diagnosis, and plan of care documented in the resident's note.

## 2023-03-05 NOTE — Assessment & Plan Note (Signed)
 Given patient's history of cognitive impairment, patient's son [name] assisted in providing supplemental information. Patient has history of mixed urinary incontinence. Feels an urge to go, also feels like urine just comes out, indicating a presence of both urgency and overflow incontinence. Can use bathroom but supplies help patient minimize going on herself and requiring additional assistance to get cleaned up. Denies any fevers, dysuria, hematuria, or changes in frequency since last visit. Patient/patient's son did not share any concerns for localized pain (e.g., suprapubic, flank pain) or infection at this time. Patient has been receiving urinary incontinence supplies for some time, which have been very helpful to her avoiding having accidents. Has had some sporadic incidents of fecal incontinence. Overall satisfied with the use of adult diapers/pull ups.   Will place DME order again as patient continues to benefit from incontinence supply to help address mixed urinary incontinence and improve her quality of life. Patient to continue having regular follow ups for chronic medical concerns and healthcare maintenance.

## 2023-03-17 ENCOUNTER — Telehealth: Payer: Self-pay | Admitting: Internal Medicine

## 2023-03-17 DIAGNOSIS — F32A Depression, unspecified: Secondary | ICD-10-CM

## 2023-03-17 MED ORDER — VENLAFAXINE HCL ER 150 MG PO CP24: 150.0000 mg | ORAL_CAPSULE | Freq: Every day | ORAL | 3 refills | Status: DC

## 2023-03-17 NOTE — Telephone Encounter (Signed)
venlafaxine XR (EFFEXOR-XR) 150 MG 24 hr capsule   [id number] - [place], [place] - [address] AT [facility] [address]

## 2023-03-19 ENCOUNTER — Encounter: Payer: 59 | Admitting: Internal Medicine

## 2023-03-24 ENCOUNTER — Other Ambulatory Visit: Payer: Self-pay

## 2023-03-24 DIAGNOSIS — Z794 Long term (current) use of insulin: Secondary | ICD-10-CM

## 2023-03-24 MED ORDER — DEXCOM G7 SENSOR MISC: 3 refills | Status: DC

## 2023-03-24 MED ORDER — METFORMIN HCL 1000 MG PO TABS: 1000.0000 mg | ORAL_TABLET | Freq: Two times a day (BID) | ORAL | 3 refills | Status: DC

## 2023-04-04 NOTE — Addendum Note (Signed)
 Addended by: Derrek Monaco on: 04/04/2023 09:06 AM   Modules accepted: Orders

## 2023-04-09 ENCOUNTER — Other Ambulatory Visit: Payer: Self-pay

## 2023-04-09 DIAGNOSIS — E1142 Type 2 diabetes mellitus with diabetic polyneuropathy: Secondary | ICD-10-CM

## 2023-04-09 MED ORDER — TRULICITY 0.75 MG/0.5ML ~~LOC~~ SOAJ: SUBCUTANEOUS | 5 refills | Status: DC

## 2023-04-09 NOTE — Telephone Encounter (Signed)
 Medication sent to pharmacy

## 2023-04-10 ENCOUNTER — Telehealth: Payer: Self-pay | Admitting: *Deleted

## 2023-04-10 NOTE — Telephone Encounter (Signed)
Will forward to patients PCP

## 2023-04-10 NOTE — Telephone Encounter (Signed)
 Copied from CRM [id number]. Topic: Clinical - Medical Advice >> Apr 10, 2023  3:40 PM Hamdi H wrote: Reason for CRM: Adela Lank called from [employer] today wanting to relay a new finding after the patients AWV. There was an irregular heart rate reported. Patient and her son are not aware of patient having a history of that before. Best person to call back can be either the patient or her son Fayrene Fearing for a follow up.

## 2023-04-20 ENCOUNTER — Other Ambulatory Visit: Payer: Self-pay

## 2023-04-20 ENCOUNTER — Encounter (HOSPITAL_COMMUNITY): Payer: Self-pay | Admitting: Emergency Medicine

## 2023-04-20 ENCOUNTER — Observation Stay (HOSPITAL_COMMUNITY)
Admission: EM | Admit: 2023-04-20 | Discharge: 2023-04-21 | Disposition: A | Discharge status: Home / Self Care | Attending: Infectious Diseases | Admitting: Infectious Diseases

## 2023-04-20 ENCOUNTER — Other Ambulatory Visit (HOSPITAL_COMMUNITY)

## 2023-04-20 ENCOUNTER — Emergency Department (HOSPITAL_COMMUNITY)

## 2023-04-20 ENCOUNTER — Observation Stay (HOSPITAL_BASED_OUTPATIENT_CLINIC_OR_DEPARTMENT_OTHER)

## 2023-04-20 DIAGNOSIS — R000 Tachycardia, unspecified: Secondary | ICD-10-CM

## 2023-04-20 DIAGNOSIS — E039 Hypothyroidism, unspecified: Secondary | ICD-10-CM | POA: Diagnosis not present

## 2023-04-20 DIAGNOSIS — R739 Hyperglycemia, unspecified: Secondary | ICD-10-CM | POA: Diagnosis not present

## 2023-04-20 DIAGNOSIS — E785 Hyperlipidemia, unspecified: Secondary | ICD-10-CM | POA: Diagnosis not present

## 2023-04-20 DIAGNOSIS — I4891 Unspecified atrial fibrillation: Secondary | ICD-10-CM | POA: Diagnosis not present

## 2023-04-20 DIAGNOSIS — E1169 Type 2 diabetes mellitus with other specified complication: Secondary | ICD-10-CM | POA: Diagnosis not present

## 2023-04-20 DIAGNOSIS — E1165 Type 2 diabetes mellitus with hyperglycemia: Secondary | ICD-10-CM | POA: Diagnosis not present

## 2023-04-20 DIAGNOSIS — Z7985 Long-term (current) use of injectable non-insulin antidiabetic drugs: Secondary | ICD-10-CM | POA: Diagnosis not present

## 2023-04-20 DIAGNOSIS — Z853 Personal history of malignant neoplasm of breast: Secondary | ICD-10-CM | POA: Insufficient documentation

## 2023-04-20 DIAGNOSIS — R9082 White matter disease, unspecified: Secondary | ICD-10-CM | POA: Diagnosis not present

## 2023-04-20 DIAGNOSIS — I10 Essential (primary) hypertension: Secondary | ICD-10-CM | POA: Insufficient documentation

## 2023-04-20 DIAGNOSIS — Z79899 Other long term (current) drug therapy: Secondary | ICD-10-CM | POA: Diagnosis not present

## 2023-04-20 DIAGNOSIS — E876 Hypokalemia: Secondary | ICD-10-CM | POA: Diagnosis not present

## 2023-04-20 DIAGNOSIS — Z7984 Long term (current) use of oral hypoglycemic drugs: Secondary | ICD-10-CM | POA: Diagnosis not present

## 2023-04-20 DIAGNOSIS — R531 Weakness: Secondary | ICD-10-CM | POA: Diagnosis not present

## 2023-04-20 LAB — CBC
HCT: 34.9 % — ABNORMAL LOW (ref 36.0–46.0)
Hemoglobin: 11.4 g/dL — ABNORMAL LOW (ref 12.0–15.0)
MCH: 30.2 pg (ref 26.0–34.0)
MCHC: 32.7 g/dL (ref 30.0–36.0)
MCV: 92.3 fL (ref 80.0–100.0)
Platelets: 250 10*3/uL (ref 150–400)
RBC: 3.78 MIL/uL — ABNORMAL LOW (ref 3.87–5.11)
RDW: 13.9 % (ref 11.5–15.5)
WBC: 8 10*3/uL (ref 4.0–10.5)
nRBC: 0 % (ref 0.0–0.2)

## 2023-04-20 LAB — URINALYSIS, MICROSCOPIC (REFLEX)

## 2023-04-20 LAB — T4, FREE: Free T4: 0.87 ng/dL (ref 0.61–1.12)

## 2023-04-20 LAB — COMPREHENSIVE METABOLIC PANEL
ALT: 10 U/L (ref 0–44)
AST: 21 U/L (ref 15–41)
Albumin: 3.8 g/dL (ref 3.5–5.0)
Alkaline Phosphatase: 39 U/L (ref 38–126)
Anion gap: 11 (ref 5–15)
BUN: 18 mg/dL (ref 8–23)
CO2: 22 mmol/L (ref 22–32)
Calcium: 9.6 mg/dL (ref 8.9–10.3)
Chloride: 104 mmol/L (ref 98–111)
Creatinine, Ser: 1.02 mg/dL — ABNORMAL HIGH (ref 0.44–1.00)
GFR, Estimated: 58 mL/min — ABNORMAL LOW (ref >60)
Glucose, Bld: 150 mg/dL — ABNORMAL HIGH (ref 70–99)
Potassium: 3.8 mmol/L (ref 3.5–5.1)
Sodium: 137 mmol/L (ref 135–145)
Total Bilirubin: 0.4 mg/dL (ref 0.0–1.2)
Total Protein: 6.8 g/dL (ref 6.5–8.1)

## 2023-04-20 LAB — DIFFERENTIAL
Abs Immature Granulocytes: 0.03 10*3/uL (ref 0.00–0.07)
Basophils Absolute: 0 10*3/uL (ref 0.0–0.1)
Basophils Relative: 1 %
Eosinophils Absolute: 0.1 10*3/uL (ref 0.0–0.5)
Eosinophils Relative: 1 %
Immature Granulocytes: 0 %
Lymphocytes Relative: 29 %
Lymphs Abs: 2.4 10*3/uL (ref 0.7–4.0)
Monocytes Absolute: 0.5 10*3/uL (ref 0.1–1.0)
Monocytes Relative: 7 %
Neutro Abs: 5 10*3/uL (ref 1.7–7.7)
Neutrophils Relative %: 62 %

## 2023-04-20 LAB — TSH: TSH: 0.027 u[IU]/mL — ABNORMAL LOW (ref 0.350–4.500)

## 2023-04-20 LAB — PROTIME-INR
INR: 1 (ref 0.8–1.2)
Prothrombin Time: 12.9 s (ref 11.4–15.2)

## 2023-04-20 LAB — URINALYSIS, ROUTINE W REFLEX MICROSCOPIC
Bilirubin Urine: NEGATIVE
Glucose, UA: >=500 mg/dL — AB
Hgb urine dipstick: NEGATIVE
Ketones, ur: NEGATIVE mg/dL
Leukocytes,Ua: NEGATIVE
Nitrite: NEGATIVE
Protein, ur: NEGATIVE mg/dL
Specific Gravity, Urine: 1.01 (ref 1.005–1.030)
pH: 6 (ref 5.0–8.0)

## 2023-04-20 LAB — ECHOCARDIOGRAM COMPLETE
Height: 62 in
S' Lateral: 2.7 cm
Weight: 1760 [oz_av]

## 2023-04-20 LAB — GLUCOSE, CAPILLARY
Glucose-Capillary: 242 mg/dL — ABNORMAL HIGH (ref 70–99)
Glucose-Capillary: 294 mg/dL — ABNORMAL HIGH (ref 70–99)
Glucose-Capillary: 324 mg/dL — ABNORMAL HIGH (ref 70–99)
Glucose-Capillary: 362 mg/dL — ABNORMAL HIGH (ref 70–99)

## 2023-04-20 LAB — TROPONIN I (HIGH SENSITIVITY): Troponin I (High Sensitivity): 6 ng/L (ref <18)

## 2023-04-20 LAB — D-DIMER, QUANTITATIVE: D-Dimer, Quant: 0.34 ug{FEU}/mL (ref 0.00–0.50)

## 2023-04-20 LAB — APTT: aPTT: 34 s (ref 24–36)

## 2023-04-20 LAB — CBG MONITORING, ED: Glucose-Capillary: 148 mg/dL — ABNORMAL HIGH (ref 70–99)

## 2023-04-20 LAB — ETHANOL: Alcohol, Ethyl (B): <10 mg/dL (ref <10)

## 2023-04-20 MED ORDER — INSULIN ASPART 100 UNIT/ML IJ SOLN
0.0000 [IU] | Freq: Three times a day (TID) | INTRAMUSCULAR | Status: DC
  Administered 2023-04-20 – 2023-04-21 (×2): 9 [IU] via SUBCUTANEOUS
  Administered 2023-04-21: 3 [IU] via SUBCUTANEOUS

## 2023-04-20 MED ORDER — VENLAFAXINE HCL ER 150 MG PO CP24
150.0000 mg | ORAL_CAPSULE | Freq: Every day | ORAL | Status: DC
  Administered 2023-04-21: 150 mg via ORAL
  Filled 2023-04-20: qty 1

## 2023-04-20 MED ORDER — SODIUM CHLORIDE 0.9 % IV BOLUS
500.0000 mL | Freq: Once | INTRAVENOUS | Status: AC
  Administered 2023-04-20: 500 mL via INTRAVENOUS

## 2023-04-20 MED ORDER — LACTATED RINGERS IV BOLUS: 500.0000 mL | Freq: Once | INTRAVENOUS | Status: DC

## 2023-04-20 MED ORDER — SODIUM CHLORIDE 0.9 % IV SOLN: 250.0000 mL | INTRAVENOUS | Status: AC | PRN

## 2023-04-20 MED ORDER — DILTIAZEM HCL-DEXTROSE 125-5 MG/125ML-% IV SOLN (PREMIX)
5.0000 mg/h | INTRAVENOUS | Status: DC
Start: 2023-04-20 — End: 2023-04-20
  Filled 2023-04-20 (×2): qty 125

## 2023-04-20 MED ORDER — BUSPIRONE HCL 5 MG PO TABS
5.0000 mg | ORAL_TABLET | Freq: Two times a day (BID) | ORAL | Status: DC
  Administered 2023-04-20 – 2023-04-21 (×2): 5 mg via ORAL
  Filled 2023-04-20 (×2): qty 1

## 2023-04-20 MED ORDER — SODIUM CHLORIDE 0.9% FLUSH: 3.0000 mL | INTRAVENOUS | Status: DC | PRN

## 2023-04-20 MED ORDER — LACTATED RINGERS IV BOLUS
500.0000 mL | Freq: Once | INTRAVENOUS | Status: AC
Start: 2023-04-20 — End: 2023-04-20
  Administered 2023-04-20: 500 mL via INTRAVENOUS

## 2023-04-20 MED ORDER — HYDROXYZINE HCL 10 MG PO TABS
10.0000 mg | ORAL_TABLET | Freq: Once | ORAL | Status: AC
Start: 2023-04-20 — End: 2023-04-20
  Administered 2023-04-20: 10 mg via ORAL
  Filled 2023-04-20: qty 1

## 2023-04-20 MED ORDER — SODIUM CHLORIDE 0.9% FLUSH
3.0000 mL | Freq: Two times a day (BID) | INTRAVENOUS | Status: DC
  Administered 2023-04-20 – 2023-04-21 (×3): 3 mL via INTRAVENOUS

## 2023-04-20 MED ORDER — SODIUM CHLORIDE 0.9% FLUSH: 3.0000 mL | Freq: Once | INTRAVENOUS | Status: DC

## 2023-04-20 MED ORDER — ENSURE ENLIVE PO LIQD
237.0000 mL | Freq: Two times a day (BID) | ORAL | Status: DC
  Administered 2023-04-20 – 2023-04-21 (×3): 237 mL via ORAL

## 2023-04-20 MED ORDER — ENOXAPARIN SODIUM 40 MG/0.4ML IJ SOSY
40.0000 mg | PREFILLED_SYRINGE | Freq: Every day | INTRAMUSCULAR | Status: DC
  Administered 2023-04-20 – 2023-04-21 (×2): 40 mg via SUBCUTANEOUS
  Filled 2023-04-20 (×2): qty 0.4

## 2023-04-20 NOTE — ED Provider Notes (Signed)
 [place] EMERGENCY DEPARTMENT AT [facility] Provider Note   CSN: [id number] Arrival date & time: 04/20/23  7829     History  Chief Complaint  Patient presents with   Weakness   Atrial Fibrillation    [name] is a 73 y.o. female.  The history is provided by the patient and a relative.  Patient with extensive history including cognitive impairment, diabetes, depression presents for generalized weakness.  Patient is unsure when this started. Patient denies any pain.  She denies any fevers or vomiting. Denies any chest pain or shortness of breath, denies any headache.    Past Medical History:  Diagnosis Date   Advanced care planning/counseling discussion 05/29/2021   Arthritis    Bilateral hip pain 02/24/2015   Borderline glaucoma of both eyes    Depression    Diabetic polyneuropathy (HCC)    Diverticulosis of colon    Dizziness 12/05/2017   Dry eyes, bilateral    Fall 08/02/2021   Feeling of incomplete bladder emptying    Frequent falls 01/08/2018   Gait abnormality 07/19/2021   History of breast cancer oncologist-  dr Marlena Clipper-- per lov note no recurrence   dx 04/ 1999 --- Stage 3B-- s/p  chemotherapy then right mastectomy then concurrent chemoradiation therapy   History of urinary retention    01/ 2018   Hydronephrosis 04/01/2017   Hydronephrosis of right kidney    Hyperlipidemia    Hypertension    Hypokalemia 12/09/2016   Hypomagnesemia 12/09/2016   Hypothyroidism    Insulin dependent type 2 diabetes mellitus [facility])    endocrinologist-  dr Elvera Lennox---  last A1c 8.7 on 02-20-2016   Left leg pain 03/15/2019   LOW BACK PAIN 04/16/2006   Qualifier: Diagnosis of  By: Audria Nine MD, Britta Mccreedy     Lymphedema of upper extremity    right   Macular degeneration, left eye    followed by dr Ashley Royalty   Renal insufficiency    Seizure-like activity (HCC) 04/17/2019   Urgency of urination    Visual hallucinations 01/12/2019   Vitamin D deficiency  08/17/2019   Weight loss 08/17/2019    Home Medications Prior to Admission medications   Medication Sig Start Date End Date Taking? Authorizing Provider  cyanocobalamin (VITAMIN B12) 500 MCG tablet Take 500 mcg by mouth daily.   Yes [provider]  Dulaglutide (TRULICITY) 0.75 MG/0.5ML SOAJ INJECT THE CONTENTS OF ONE PEN  SUBCUTANEOUSLY WEEKLY AS  DIRECTED 04/09/23  Yes Mercie Eon, MD  empagliflozin (JARDIANCE) 25 MG TABS tablet Take 1 tablet (25 mg total) by mouth daily. 01/06/23  Yes Mercie Eon, MD  glucose 4 GM chewable tablet Chew 4 tablets (16 g total) by mouth as needed for low blood sugar. 05/28/12  Yes Paya, Blossom Hoops, DO  busPIRone (BUSPAR) 5 MG tablet TAKE 1 TABLET(5 MG) BY MOUTH TWICE DAILY Patient not taking: Reported on 04/20/2023 11/12/22   Mercie Eon, MD  latanoprost (XALATAN) 0.005 % ophthalmic solution 1 drop at bedtime. 05/10/22   [provider]  levothyroxine (SYNTHROID) 75 MCG tablet Take 1 tablet (75 mcg total) by mouth daily before breakfast. 08/16/22   Gust Rung, DO  metFORMIN (GLUCOPHAGE) 1000 MG tablet Take 1 tablet (1,000 mg total) by mouth 2 (two) times daily with a meal. 03/24/23 03/23/24  Mercie Eon, MD  Nutritional Supplements (GLUCERNA SNACK SHAKE) LIQD Take 1 Dose by mouth 3 (three) times daily as needed. 08/06/17   Earl Lagos, MD  olmesartan (BENICAR) 20 MG tablet Take  1 tablet (20 mg total) by mouth daily. 02/12/23 02/12/24  Reymundo Poll, MD  pioglitazone (ACTOS) 15 MG tablet Take 1 tablet (15 mg total) by mouth daily. 08/20/22 08/20/23  Mercie Eon, MD  rosuvastatin (CRESTOR) 20 MG tablet Take 1 tablet (20 mg total) by mouth daily. 01/16/23   Mercie Eon, MD  venlafaxine XR (EFFEXOR-XR) 150 MG 24 hr capsule Take 1 capsule (150 mg total) by mouth daily with breakfast. 03/17/23 03/16/24  Mercie Eon, MD      Allergies    Gabapentin, Meclizine hcl, Metformin hcl er, Penicillins, and Sulfa antibiotics    Review of  Systems   Review of Systems  Constitutional:  Negative for fever.  Gastrointestinal:  Negative for vomiting.  Neurological:  Positive for weakness. Negative for headaches.    Physical Exam Updated Vital Signs BP (!) 151/86   Pulse (!) 117   Temp 99.1 F (37.3 C) (Oral)   Resp 14   Ht 1.575 m (5\' 2" )   Wt 49.9 kg   SpO2 100%   BMI 20.12 kg/m  Physical Exam CONSTITUTIONAL: Elderly, no acute distress HEAD: Normocephalic/atraumatic EYES: EOMI ENMT: Mucous membranes moist NECK: supple no meningeal signs CV: Tachycardic and irregular LUNGS: Lungs are clear to auscultation bilaterally, no apparent distress ABDOMEN: soft, nontender NEURO: Pt is awake/alert/appropriate, moves all extremitiesx4.  No facial droop.  No arm or leg drift EXTREMITIES: pulses normal/equal, full ROM SKIN: warm, color normal  ED Results / Procedures / Treatments   Labs (all labs ordered are listed, but only abnormal results are displayed) Labs Reviewed  CBC - Abnormal; Notable for the following components:      Result Value   RBC 3.78 (*)    Hemoglobin 11.4 (*)    HCT 34.9 (*)    All other components within normal limits  COMPREHENSIVE METABOLIC PANEL - Abnormal; Notable for the following components:   Glucose, Bld 150 (*)    Creatinine, Ser 1.02 (*)    GFR, Estimated 58 (*)    All other components within normal limits  CBG MONITORING, ED - Abnormal; Notable for the following components:   Glucose-Capillary 148 (*)    All other components within normal limits  PROTIME-INR  APTT  DIFFERENTIAL  ETHANOL    EKG EKG Interpretation Date/Time:  Sunday April 20 2023 05:54:00 EDT Ventricular Rate:  121 PR Interval:  154 QRS Duration:  98 QT Interval:  354 QTC Calculation: 503 R Axis:   -69  Text Interpretation: tachycardia and irregular Left anterior fascicular block Abnormal R-wave progression, late transition Confirmed by Zadie Rhine ([id number]) on 04/20/2023 5:56:35 AM  Radiology CT HEAD  WO CONTRAST Result Date: 04/20/2023 CLINICAL DATA:  73 year old female with weakness, atrial fibrillation, neurologic deficit. Hyperglycemia. Balance issues. Generalized weakness. EXAM: CT HEAD WITHOUT CONTRAST TECHNIQUE: Contiguous axial images were obtained from the base of the skull through the vertex without intravenous contrast. RADIATION DOSE REDUCTION: This exam was performed according to the departmental dose-optimization program which includes automated exposure control, adjustment of the mA and/or kV according to patient size and/or use of iterative reconstruction technique. COMPARISON:  Brain MRI 04/17/2019.  Head CT 07/21/2021. FINDINGS: Brain: Cerebral volume is stable, within normal limits for age. No midline shift, ventriculomegaly, mass effect, evidence of mass lesion, intracranial hemorrhage or evidence of cortically based acute infarction. Moderate patchy bilateral mostly periventricular white matter hypodensity has not significantly changed since 2023. Otherwise maintained gray-white differentiation. ASPECTS 10. Vascular: Heavy calcified atherosclerosis at the skull base. No suspicious intracranial  vascular hyperdensity. Skull: Intact.  No acute osseous abnormality identified. Sinuses/Orbits: Paranasal sinuses are stable and well aerated. However, there is new subtotal opacification of the right tympanic cavity and scattered mastoid opacifications new since 2023. Contralateral left middle ear and mastoids remain clear. Other: Negative visible nasopharynx. No acute orbit or scalp soft tissue finding; chronic forehead scarring. IMPRESSION: 1. New right middle ear and mastoid opacification since 2023. Consider Acute Otitis Media, mastoid effusion. 2. No other acute intracranial abnormality. Stable moderate for age white matter disease most commonly due to small vessel ischemia. Electronically Signed   By: Odessa Fleming M.D.   On: 04/20/2023 04:26    Procedures .Critical Care  Performed by: Zadie Rhine, MD Authorized by: Zadie Rhine, MD   Critical care provider statement:    Critical care time (minutes):  35   Critical care start time:  04/20/2023 5:25 AM   Critical care end time:  04/20/2023 6:00 AM   Critical care time was exclusive of:  Separately billable procedures and treating other patients   Critical care was necessary to treat or prevent imminent or life-threatening deterioration of the following conditions:  Cardiac failure   Critical care was time spent personally by me on the following activities:  Examination of patient, development of treatment plan with patient or surrogate, ordering and review of laboratory studies, re-evaluation of patient's condition, review of old charts and obtaining history from patient or surrogate   I assumed direction of critical care for this patient from another provider in my specialty: no       Medications Ordered in ED Medications  sodium chloride flush (NS) 0.9 % injection 3 mL (has no administration in time range)  diltiazem (CARDIZEM) 125 mg in dextrose 5% 125 mL (1 mg/mL) infusion (has no administration in time range)    ED Course/ Medical Decision Making/ A&P Clinical Course as of 04/20/23 1610  Wynelle Link Apr 20, 2023  0528 Patient presents for generalized weakness.  On my initial evaluation patient was able to call many details of why she was in the ER when things started.  There was some reports that she might have been hyperglycemic but labs are overall reassuring.  I spoke to her son via the phone.  He reports last week she was seen by at home nurse who thought her heart rate was elevated.  Tonight she was having generalized weakness.  This appears to be new onset atrial fibrillation but unclear onset [DW]  0603 repeat EKG shows tachycardia around 120, however there are some P waves but appear irregular and not always consistent. Strong suspicion this may represent A-fib versus a flutter  Plan to start IV Cardizem [DW]  0604  Patient otherwise stabilized the ER, discussed with internal medicine resident for admission [DW]    Clinical Course User Index [DW] Zadie Rhine, MD         CHA2DS2-VASc Score: 4                        Medical Decision Making Amount and/or Complexity of Data Reviewed Labs: ordered. Radiology: ordered. ECG/medicine tests: ordered.  Risk Prescription drug management. Decision regarding hospitalization.   This patient presents to the ED for concern of weakness, this involves an extensive number of treatment options, and is a complaint that carries with it a high risk of complications and morbidity.  The differential diagnosis includes but is not limited to CVA, intracranial hemorrhage, acute coronary syndrome, renal failure, urinary  tract infection, electrolyte disturbance, pneumonia Cardiac dysrhythmia  Comorbidities that complicate the patient evaluation: Patient's presentation is complicated by their history of cognitive impairment  Social Determinants of Health: Patient's  poor mobility   increases the complexity of managing their presentation  Additional history obtained: Additional history obtained from family Records reviewed Primary Care Documents  Lab Tests: I Ordered, and personally interpreted labs.  The pertinent results include: Mild anemia  Imaging Studies ordered: I ordered imaging studies including CT scan head   I independently visualized and interpreted imaging which showed no acute findings I agree with the radiologist interpretation  Cardiac Monitoring: The patient was maintained on a cardiac monitor.  I personally viewed and interpreted the cardiac monitor which showed an underlying rhythm of:  Atrial Fibrillation  Medicines ordered and prescription drug management: I ordered medication including Cardizem for tachycardia   Critical Interventions:   admission, Cardizem  Consultations Obtained: I requested consultation with the admitting  physician internal medicine resident , and discussed  findings as well as pertinent plan - they recommend: Will admit  Reevaluation: After the interventions noted above, I reevaluated the patient and found that they have :stayed the same  Complexity of problems addressed: Patient's presentation is most consistent with  acute presentation with potential threat to life or bodily function  Disposition: After consideration of the diagnostic results and the patient's response to treatment,  I feel that the patent would benefit from admission   .           Final Clinical Impression(s) / ED Diagnoses Final diagnoses:  Atrial fibrillation with rapid ventricular response [facility])    Rx / DC Orders ED Discharge Orders     None         Zadie Rhine, MD 04/20/23 [phone]

## 2023-04-20 NOTE — Hospital Course (Addendum)
 General Weakness  Sinus Tachycardia This patient presented to [place] via her son due to generalized weakness and not feeling well overall. She was found to be tachycardic in the 150's on arrival. Etiologies like infection, PE , anemia, hypothyroidism, fever and medications and heart failure were considered. She had a ECHO that was fairly unremarkable but did showed evidence of small right-sided cardial effusion.  Patient continued to be in sinus tach and cardiology was consulted.  Further initial assessment and review, data patient will benefit from outpatient metoprolol 25 mg for rate control.  During this hospitalization, she also reported mild suprapubic pain which resolved and UA was not concerning for urinary tract infection.  Patient was discharged home to his son and instructions on reducing Synthroid to 75 mg and starting metoprolol 25 mg ,adequately explained to the son who manages all of this patient's medication.  She was also set up with a follow-up appointment on internal medicine clinic with her primary care physician Dr. Mercie Eon .  #Hyperglycemic #T2DM Patient was hyperglycemic when she came in. Her blood glucose was 294 and repeat Hgb A1c was 8.6. It unclear if she took her diabetes medication that morning before coming in.  Her hyperglycemia  was controlled with Sliding scale insulin while she was hospitalized. Patient was discharged hom on home regimen without any changes

## 2023-04-20 NOTE — H&P (Signed)
 Date: 04/20/2023         Patient Name:  [name] MRN: [id number]  DOB: Aug 16, 1950 Age / Sex: 73 y.o., female   PCP: Mercie Eon, MD         Medical Service: Internal Medicine Teaching Service         Attending Physician: Dr. Ginnie Smart, MD    First Contact: Dr. Kathleen Lime, MD Pager [phone] Pager: [phone]-  Second Contact: Dr. Marrianne Mood, MD Pager [phone] Pager: [phone]       After Hours (After 5p/  First Contact Pager: [phone]  weekends / holidays): Second Contact Pager: [phone]   Chief Concern: Weakness   History of Present Illness: This patient has a history of cognitive impairment, type 2 diabetes, hyperlipidemia, hypertension and hypothyroidism who was brought in by her son due to concerns for generalized weakness.  On my initial encounter with the patient, she reported she does not remember why she is in the hospital.  Her son who was present reported that her mother was in her usual state of health until 2:00 this morning when she called him to report generalized weakness and not feeling well overall so he decided to bring the patient to the ED. The son was worried because he thought the patient had hypoglycemia.   Patient continued to remain unsure why she is in the hospital and denies any chest pain, nausea, vomiting, shortness of breath and denies any headache.  Allergies: Meclizine -rash Metformin -diarrhea Penicillin- rash Sulfa antibiotics - rash  Past Medical History: Hypothyroidism Depression History of breast cancer Hyperlipidemia  Medications: Trulicity 0.75 Jardiance Buspirone Synthroid 75 mcg Metformin  Olmesartan 20 mg Pioglitazone 15 mg Crestor 20 mg Venlafaxine 150 mcg  Surgical History: Noncontributory to this admission  Family History:  Father -diabetes, heart disease   Social History:  Patient is retired from working at [employer], independent in all ADLs and IADLs but gets help from his son regarding her home  medications.  She denies any history of alcohol use or any other illicit drugs.  She has never smoked.  She lives at home with her son. She would like to be full code at this time.  Physical Exam: Blood pressure (!) 151/86, pulse (!) 117, temperature 99.1 F (37.3 C), temperature source Oral, resp. rate 14, height 5\' 2"  (1.575 m), weight 49.9 kg, SpO2 100%.  Constitutional: well-appearing woman, sitting in bed , in no acute distress HENT: No masses appreciated on the neck, normocephalic atraumatic, mucous membranes moist Cardiovascular: Regular irregular rhythm, tachycardic. No  JVD Pulmonary/Chest: normal work of breathing on room air, lungs clear to auscultation bilaterally. No crackles  Abdominal: soft, non-tender, non-distended.  Mild suprapubic tenderness Neurological: alert & oriented x 3 MSK: no gross abnormalities. No pitting edema Skin: warm and dry Psych: Normal mood and affect   EKG:  Sinus Tachycardia  Labs:    Latest Ref Rng & Units 04/20/2023    3:50 AM 10/30/2021    9:32 AM 10/02/2021   11:51 AM  CBC  WBC 4.0 - 10.5 K/uL 8.0  7.3  7.5   Hemoglobin 12.0 - 15.0 g/dL 28.4  13.2  44.0   Hematocrit 36.0 - 46.0 % 34.9  32.0  31.5   Platelets 150 - 400 K/uL 250  227  243        Latest Ref Rng & Units 04/20/2023    3:50 AM 01/15/2022   10:20 AM 07/21/2021    6:52 AM  CMP  Glucose 70 - 99 mg/dL 161  096  87   BUN 8 - 23 mg/dL 18  21  26    Creatinine 0.44 - 1.00 mg/dL 0.45  4.09  8.11   Sodium 135 - 145 mmol/L 137  138  140   Potassium 3.5 - 5.1 mmol/L 3.8  3.8  3.9   Chloride 98 - 111 mmol/L 104  104  106   CO2 22 - 32 mmol/L 22  25  25    Calcium 8.9 - 10.3 mg/dL 9.6  9.7  9.4   Total Protein 6.5 - 8.1 g/dL 6.8   7.0   Total Bilirubin 0.0 - 1.2 mg/dL 0.4   0.4   Alkaline Phos 38 - 126 U/L 39   45   AST 15 - 41 U/L 21   26   ALT 0 - 44 U/L 10   14      Images and other studies:  Imaging: CT HEAD WO CONTRAST Result Date: 04/20/2023 CLINICAL DATA:  73 year old  female with weakness, atrial fibrillation, neurologic deficit. Hyperglycemia. Balance issues. Generalized weakness. EXAM: CT HEAD WITHOUT CONTRAST TECHNIQUE: Contiguous axial images were obtained from the base of the skull through the vertex without intravenous contrast. RADIATION DOSE REDUCTION: This exam was performed according to the departmental dose-optimization program which includes automated exposure control, adjustment of the mA and/or kV according to patient size and/or use of iterative reconstruction technique. COMPARISON:  Brain MRI 04/17/2019.  Head CT 07/21/2021. FINDINGS: Brain: Cerebral volume is stable, within normal limits for age. No midline shift, ventriculomegaly, mass effect, evidence of mass lesion, intracranial hemorrhage or evidence of cortically based acute infarction. Moderate patchy bilateral mostly periventricular white matter hypodensity has not significantly changed since 2023. Otherwise maintained gray-white differentiation. ASPECTS 10. Vascular: Heavy calcified atherosclerosis at the skull base. No suspicious intracranial vascular hyperdensity. Skull: Intact.  No acute osseous abnormality identified. Sinuses/Orbits: Paranasal sinuses are stable and well aerated. However, there is new subtotal opacification of the right tympanic cavity and scattered mastoid opacifications new since 2023. Contralateral left middle ear and mastoids remain clear. Other: Negative visible nasopharynx. No acute orbit or scalp soft tissue finding; chronic forehead scarring. IMPRESSION: 1. New right middle ear and mastoid opacification since 2023. Consider Acute Otitis Media, mastoid effusion. 2. No other acute intracranial abnormality. Stable moderate for age white matter disease most commonly due to small vessel ischemia. Electronically Signed   By: Odessa Fleming M.D.   On: 04/20/2023 04:26      Assessment & Plan:  [name] is a 73 y.o. history of cognitive impairment, diabetes and depression who  presents for generalized weakness, and found to be sinus tachycardia and admitted for further workup.  Principal Problem:   Sinus tachycardia Active Problems:   Hypothyroidism   Type 2 diabetes mellitus with other specified complication (HCC)  #Sinus tachycardia Patient is stable,denies any palpations. Denies any chest pain and not short of breath.  Son reports that the patient has being thinking a lot about her lost daughter which makes her anxious and cries sometimes.  She have a history of cognitive impairment and it is difficult to get a good sense of how she is feeling at this time.  She does not even seem to remember why she was brought in the hospital.  Her hemoglobin is stable at 11.4. No leukocytosis, chest x-ray shows no acute cardiopulmonary abnormalities which reassures me that this patient sinus tachycardia is not due to an infection.  Patient was  hyperglycemic on presentation but no other electrolyte derangement noted on CMP.  Cardiology was consulted who agrees with getting echo to further assess. - Continue telemetry monitoring - IVF - ECHO - TSH  - Cardiology assistance appreciated  - Troponin     #Hx of T2DM - SSI - Hgb A1c - CBG    Level of care: Med Telemetry Diet: Diet IVF: LR VTE: enoxaparin (LOVENOX) injection 40 mg Start: 04/20/23 1000 Code: FULL Surrogate: Son  Signed: Kathleen Lime, MD 04/20/2023, 4:42 PM

## 2023-04-20 NOTE — ED Notes (Signed)
 Pt to CT2 with this RN

## 2023-04-20 NOTE — Progress Notes (Signed)
Heart rate is too high for accurate echo at this time. 

## 2023-04-20 NOTE — Progress Notes (Signed)
  Echocardiogram 2D Echocardiogram has been performed.  [name] 04/20/2023, 5:44 PM

## 2023-04-20 NOTE — Progress Notes (Signed)
 Notified Dr. Benito Mccreedy Pt. CBG: 294 and that patient just awoke more confused

## 2023-04-20 NOTE — ED Triage Notes (Addendum)
 Pt in with generalized weakness and reported hyperglycemia, states she woke up at 0230 and felt like her sugar was high. Pt is DMII, CBG 148 in triage, Afib 120's in triage no reported hx of this. Anxious, states she went to bed normal at 8pm. A&ox4  Addendum: pt's son arrives and states pt woke him up at 0230 and he's noticed balance issues since waking

## 2023-04-21 ENCOUNTER — Other Ambulatory Visit (HOSPITAL_COMMUNITY): Payer: Self-pay

## 2023-04-21 DIAGNOSIS — R000 Tachycardia, unspecified: Secondary | ICD-10-CM | POA: Diagnosis not present

## 2023-04-21 DIAGNOSIS — E039 Hypothyroidism, unspecified: Secondary | ICD-10-CM

## 2023-04-21 DIAGNOSIS — I10 Essential (primary) hypertension: Secondary | ICD-10-CM | POA: Diagnosis not present

## 2023-04-21 DIAGNOSIS — E1169 Type 2 diabetes mellitus with other specified complication: Secondary | ICD-10-CM | POA: Diagnosis not present

## 2023-04-21 LAB — GLUCOSE, CAPILLARY
Glucose-Capillary: 361 mg/dL — ABNORMAL HIGH (ref 70–99)
Glucose-Capillary: 239 mg/dL — ABNORMAL HIGH (ref 70–99)

## 2023-04-21 LAB — HEMOGLOBIN A1C
Hgb A1c MFr Bld: 8.6 % — ABNORMAL HIGH (ref 4.8–5.6)
Mean Plasma Glucose: 200 mg/dL

## 2023-04-21 MED ORDER — LEVOTHYROXINE SODIUM 75 MCG PO TABS: 37.5000 ug | ORAL_TABLET | Freq: Every day | ORAL | Status: DC

## 2023-04-21 MED ORDER — MELATONIN 3 MG PO TABS
3.0000 mg | ORAL_TABLET | Freq: Every day | ORAL | Status: DC
  Administered 2023-04-21: 3 mg via ORAL
  Filled 2023-04-21: qty 1

## 2023-04-21 MED ORDER — METOPROLOL TARTRATE 25 MG PO TABS
25.0000 mg | ORAL_TABLET | Freq: Two times a day (BID) | ORAL | 0 refills | Status: DC
  Filled 2023-04-21: qty 60, 30d supply, fill #0

## 2023-04-21 MED ORDER — METOPROLOL TARTRATE 25 MG PO TABS
25.0000 mg | ORAL_TABLET | Freq: Two times a day (BID) | ORAL | Status: DC
  Administered 2023-04-21: 25 mg via ORAL
  Filled 2023-04-21: qty 1

## 2023-04-21 NOTE — Progress Notes (Addendum)
 DISCHARGE NOTE HOME [name] to be discharged Home per MD order. Discussed prescriptions and follow up appointments with the patient. Prescriptions given to patient; medication list explained in detail. Patient verbalized understanding.Went over details with ride also.  TOC meds picked up.  Skin clean, dry and intact without evidence of skin break down, no evidence of skin tears noted. IV catheter discontinued intact. Site without signs and symptoms of complications. Dressing and pressure applied. Pt denies pain at the site currently. No complaints noted.  Patient free of lines, drains, and wounds.   An After Visit Summary (AVS) was printed and given to the patient.   Patient escorted via wheelchair, and discharged home via private auto.  Velia Meyer, RN

## 2023-04-21 NOTE — Discharge Instructions (Addendum)
 To [name] or their caretakers,  They were admitted to [facility] on 04/20/2023 for evaluation and treatment of:  Principal Problem:   Sinus tachycardia Active Problems:   Hypothyroidism   Type 2 diabetes mellitus with other specified complication [facility])  Resolved Problems:   * No resolved hospital problems. *  The evaluation suggested sinus Tachycardia.   They were discharged from the hospital on 04/21/23. I recommend the following after leaving the hospital:  Your thyroid medication,synthroid, had been decreased to 37.5 mg daily. Please take half of your former dose,meaning, you will break your pill into half and take it.  For your fast heart rate, Cardiology saw you and they are starting you on metoprolol 25 mg twice daily.  You have a follow up appointment with Dr Lafonda Mosses on 04/30/2023. Please be sure to follow up with your doctor.   For questions about your care plan, until you are able to see your primary doctor: Call [phone]. Dial 0 for the operator. Ask for the internal medicine resident on call.  Kathleen Lime, MD 04/21/2023, 12:29 PM

## 2023-04-21 NOTE — Discharge Summary (Signed)
 Name: [name] MRN: [id number] DOB: 1950-12-23 73 y.o. PCP: Christy Eon, MD  Date of Admission: 04/20/2023  3:13 AM Date of Discharge: 04/21/2023 Attending Physician: Dr. Ninetta Gardner   Discharge Diagnosis: Principal Problem:   Sinus tachycardia Active Problems:   Hypothyroidism   Type 2 diabetes mellitus with other specified complication [facility])    Discharge Medications: Allergies as of 04/21/2023       Reactions   Gabapentin Other (See Comments)   Dizziness from 300 mg, tolerates 100 mg Dizziness from 300 mg, tolerates 100 mg   Meclizine Hcl Rash   Metformin Hcl Er Diarrhea   Patient did not tolerate Metformin ER but is able to take lower dose of Metformin   Penicillins Rash   Has patient had a PCN reaction causing immediate rash, facial/tongue/throat swelling, SOB or lightheadedness with hypotension: Yes Has patient had a PCN reaction causing severe rash involving mucus membranes or skin necrosis: No Has patient had a PCN reaction that required hospitalization: pt was in hospital at time of reaction Has patient had a PCN reaction occurring within the last 10 years: No If all of the above answers are "NO", then may proceed with Cephalosporin use.   Sulfa Antibiotics Rash        Medication List     TAKE these medications    busPIRone 5 MG tablet Commonly known as: BUSPAR TAKE 1 TABLET(5 MG) BY MOUTH TWICE DAILY   cyanocobalamin 500 MCG tablet Commonly known as: VITAMIN B12 Take 500 mcg by mouth daily.   empagliflozin 25 MG Tabs tablet Commonly known as: Jardiance Take 1 tablet (25 mg total) by mouth daily.   Glucerna Snack Shake Liqd Take 1 Dose by mouth 3 (three) times daily as needed.   glucose 4 GM chewable tablet Chew 4 tablets (16 g total) by mouth as needed for low blood sugar.   latanoprost 0.005 % ophthalmic solution Commonly known as: XALATAN 1 drop at bedtime.   levothyroxine 75 MCG tablet Commonly known as: Synthroid Take 0.5 tablets (37.5  mcg total) by mouth daily.   metFORMIN 1000 MG tablet Commonly known as: GLUCOPHAGE Take 1 tablet (1,000 mg total) by mouth 2 (two) times daily with a meal.   metoprolol tartrate 25 MG tablet Commonly known as: LOPRESSOR Take 1 tablet (25 mg total) by mouth 2 (two) times daily.   olmesartan 20 MG tablet Commonly known as: BENICAR Take 1 tablet (20 mg total) by mouth daily.   pioglitazone 15 MG tablet Commonly known as: Actos Take 1 tablet (15 mg total) by mouth daily.   rosuvastatin 20 MG tablet Commonly known as: CRESTOR Take 1 tablet (20 mg total) by mouth daily.   Trulicity 0.75 MG/0.5ML Soaj Generic drug: Dulaglutide INJECT THE CONTENTS OF ONE PEN  SUBCUTANEOUSLY WEEKLY AS  DIRECTED   venlafaxine XR 150 MG 24 hr capsule Commonly known as: EFFEXOR-XR Take 1 capsule (150 mg total) by mouth daily with breakfast.        Disposition and follow-up:   [name] was discharged from [facility] in Good condition.  At the hospital follow up visit please address:  1.  Follow-up:  [name] was admitted for weakness and found to be in sinus tachycardia. Cardiology started her on Metoprolol 25 mg BID. Please ensure she is adherent to this medication. Kindly repeat an EKG to assess her tachycardia as she is asymptomatic. For her T2DM, she was hyperglycemic on arrival and treated with SSI. She is discharged home  with her home regimen as I didn't have enough data to make changes to her diabetes medications. Kindly repeat her finger stick to assess her hyperglycemia. Hgb A1c was 8.6 during this hospitalization. Please go over her medications with her son to ensure she is taking all her medication as she should. If she endorses complete adherence, I think it will be reasonable to modify her current regimen to bring her A1c to goal    2.  Labs / imaging needed at time of follow-up: N/A  3.  Pending labs/ test needing follow-up: T3  4.  Medication  Changes    ADDED  - Metoprolol 25 mg BID     MODIFIED  - Decrease Synthroid from 75 to 37.5 daily  Follow-up Appointments:  Follow-up Information     Christy Eon, MD Follow up.   Specialty: Internal Medicine Contact information: [address] [place], [address] [place] [address] [phone]                   Hospital Course by problem list: General Weakness  Sinus Tachycardia This patient presented to [place] via her son due to generalized weakness and not feeling well overall. She was found to be tachycardic in the 150's on arrival. Etiologies like infection, PE , anemia, hypothyroidism, fever and medications and heart failure were considered that were ruled out.WBC, CXR and D-Dimers and T4 were reassuring. She had a ECHO that was fairly unremarkable but did showed evidence of small right-sided cardial effusion.  Patient continued to be in sinus tach despite further work up  and cardiology was consulted.  Cardiology assessed the patient and EKGs and recommended starting metoprolol 25 mg BID . During this hospitalization, she also reported mild suprapubic pain which resolved and UA was not concerning for urinary tract infection. Patient was discharged home to his son and instructions on reducing Synthroid to 75 mg and starting metoprolol 25 mg ,adequately explained to the son who manages all of this patient's medication. Patient has a follow-up appointment with the  internal medicine clinic on 04/30/2023.  #Hyperglycemic #T2DM Patient was hyperglycemic when she came in. Her blood glucose was 294 and repeat Hgb A1c was 8.6. It unclear if she took her diabetes medication that morning before coming in.  Her hyperglycemia  was controlled with sliding scale insulin while she was hospitalized. Patient was discharged on her  home regimen without any changes .  Discharge Subjective: Patient seen at bedside.Said she is doing well,had no concerns. Denies any ,palpations ,chest pain or   suprapubic pain  Discharge Exam:   BP 124/62 (BP Location: Left Arm)   Pulse (!) 56   Temp 98.1 F (36.7 C) (Oral)   Resp 17   Ht 5\' 2"  (1.575 m)   Wt 49.9 kg   SpO2 100%   BMI 20.12 kg/m  Constitutional: well-appearing woman, sitting in chair, in no acute distress HENT: normocephalic atraumatic, mucous membranes moist Eyes: conjunctiva non-erythematous Neck: supple Cardiovascular: regular rate and rhythm, no m/r/g Pulmonary/Chest: normal work of breathing on room air, lungs clear to auscultation bilaterally Abdominal: soft, non-tender, non-distended MSK: normal bulk and tone Neurological: alert & oriented x 3. Continue  to forget stuff. Skin: warm and dry Psych: normal mood and affect    Pertinent Labs, Studies, and Procedures:     Latest Ref Rng & Units 04/20/2023    3:50 AM 10/30/2021    9:32 AM 10/02/2021   11:51 AM  CBC  WBC 4.0 - 10.5 K/uL  8.0  7.3  7.5   Hemoglobin 12.0 - 15.0 g/dL 16.1  09.6  04.5   Hematocrit 36.0 - 46.0 % 34.9  32.0  31.5   Platelets 150 - 400 K/uL 250  227  243        Latest Ref Rng & Units 04/20/2023    3:50 AM 01/15/2022   10:20 AM 07/21/2021    6:52 AM  CMP  Glucose 70 - 99 mg/dL 409  811  87   BUN 8 - 23 mg/dL 18  21  26    Creatinine 0.44 - 1.00 mg/dL 9.14  7.82  9.56   Sodium 135 - 145 mmol/L 137  138  140   Potassium 3.5 - 5.1 mmol/L 3.8  3.8  3.9   Chloride 98 - 111 mmol/L 104  104  106   CO2 22 - 32 mmol/L 22  25  25    Calcium 8.9 - 10.3 mg/dL 9.6  9.7  9.4   Total Protein 6.5 - 8.1 g/dL 6.8   7.0   Total Bilirubin 0.0 - 1.2 mg/dL 0.4   0.4   Alkaline Phos 38 - 126 U/L 39   45   AST 15 - 41 U/L 21   26   ALT 0 - 44 U/L 10   14     ECHOCARDIOGRAM COMPLETE Result Date: 04/20/2023    ECHOCARDIOGRAM REPORT   Patient Name:   MADELIENE TEJERA Date of Exam: 04/20/2023 Medical Rec #:  [id number]     Height:       62.0 in Accession #:    [id number]    Weight:       110.0 lb Date of Birth:  08-31-1950     BSA:          1.483 m Patient Age:     72 years      BP:           125/75 mmHg Patient Gender: F             HR:           126 bpm. Exam Location:  Inpatient Procedure: 2D Echo (Both Spectral and Color Flow Doppler were utilized during            procedure). Indications:    sinus tachycardia  History:        Patient has no prior history of Echocardiogram examinations,                 most recent 06/28/2017. Risk Factors:Diabetes, Dyslipidemia and                 Hypertension.  Sonographer:    Delcie Roch RDCS Referring Phys: [id number] JEFFREY C HATCHER  Sonographer Comments: Image acquisition challenging due to patient body habitus. IMPRESSIONS  1. Left ventricular ejection fraction, by estimation, is 60 to 65%. The left ventricle has normal function. The left ventricle has no regional wall motion abnormalities. There is severe left ventricular hypertrophy of the basal-septal segment. Left ventricular diastolic parameters are consistent with Grade I diastolic dysfunction (impaired relaxation).  2. Right ventricular systolic function is normal. The right ventricular size is normal. There is normal pulmonary artery systolic pressure. The estimated right ventricular systolic pressure is 32.2 mmHg.  3. A small pericardial effusion is present. The pericardial effusion is anterior to the right ventricle.  4. The mitral valve is normal in structure. No evidence of mitral valve regurgitation. No evidence of mitral stenosis.  5. The aortic  valve is normal in structure. Aortic valve regurgitation is not visualized. No aortic stenosis is present.  6. The inferior vena cava is normal in size with greater than 50% respiratory variability, suggesting right atrial pressure of 3 mmHg. FINDINGS  Left Ventricle: Left ventricular ejection fraction, by estimation, is 60 to 65%. The left ventricle has normal function. The left ventricle has no regional wall motion abnormalities. The left ventricular internal cavity size was normal in size. There is  severe left ventricular  hypertrophy of the basal-septal segment. Left ventricular diastolic parameters are consistent with Grade I diastolic dysfunction (impaired relaxation). Normal left ventricular filling pressure. Right Ventricle: The right ventricular size is normal. No increase in right ventricular wall thickness. Right ventricular systolic function is normal. There is normal pulmonary artery systolic pressure. The tricuspid regurgitant velocity is 2.70 m/s, and  with an assumed right atrial pressure of 3 mmHg, the estimated right ventricular systolic pressure is 32.2 mmHg. Left Atrium: Left atrial size was normal in size. Right Atrium: Right atrial size was normal in size. Pericardium: A small pericardial effusion is present. The pericardial effusion is anterior to the right ventricle. Mitral Valve: The mitral valve is normal in structure. No evidence of mitral valve regurgitation. No evidence of mitral valve stenosis. Tricuspid Valve: The tricuspid valve is normal in structure. Tricuspid valve regurgitation is mild . No evidence of tricuspid stenosis. Aortic Valve: The aortic valve is normal in structure. Aortic valve regurgitation is not visualized. No aortic stenosis is present. Pulmonic Valve: The pulmonic valve was normal in structure. Pulmonic valve regurgitation is trivial. No evidence of pulmonic stenosis. Aorta: The aortic root is normal in size and structure. Venous: The inferior vena cava is normal in size with greater than 50% respiratory variability, suggesting right atrial pressure of 3 mmHg. IAS/Shunts: No atrial level shunt detected by color flow Doppler.  LEFT VENTRICLE PLAX 2D LVIDd:         3.90 cm   Diastology LVIDs:         2.70 cm   LV e' medial:    8.05 cm/s LV PW:         1.00 cm   LV E/e' medial:  6.3 LV IVS:        1.69 cm   LV e' lateral:   12.80 cm/s LVOT diam:     2.20 cm   LV E/e' lateral: 4.0 LV SV:         33 LV SV Index:   22 LVOT Area:     3.80 cm  RIGHT VENTRICLE             IVC RV Basal diam:   2.40 cm     IVC diam: 1.30 cm RV S prime:     19.30 cm/s TAPSE (M-mode): 1.5 cm LEFT ATRIUM             Index        RIGHT ATRIUM          Index LA diam:        2.90 cm 1.96 cm/m   RA Area:     7.20 cm LA Vol (A2C):   26.0 ml 17.53 ml/m  RA Volume:   12.40 ml 8.36 ml/m LA Vol (A4C):   24.1 ml 16.25 ml/m LA Biplane Vol: 24.9 ml 16.79 ml/m  AORTIC VALVE LVOT Vmax:   61.60 cm/s LVOT Vmean:  41.300 cm/s LVOT VTI:    0.086 m  AORTA Ao Root diam: 3.00 cm  Ao Asc diam:  2.60 cm MV E velocity: 50.70 cm/s   TRICUSPID VALVE MV A velocity: 121.00 cm/s  TR Peak grad:   29.2 mmHg MV E/A ratio:  0.42         TR Vmax:        270.00 cm/s                              SHUNTS                             Systemic VTI:  0.09 m                             Systemic Diam: 2.20 cm Armanda Magic MD Electronically signed by Armanda Magic MD Signature Date/Time: 04/20/2023/6:15:50 PM    Final    DG Chest Portable 1 View Result Date: 04/20/2023 CLINICAL DATA:  73 year old female with weakness, hyperglycemia. EXAM: PORTABLE CHEST 1 VIEW COMPARISON:  Portable chest 07/21/2021 and earlier. FINDINGS: Portable AP semi upright view at 0536 hours. Improved lung volumes and regressed cardiac silhouette. Normal cardiac size and mediastinal contours. Visualized tracheal air column is within normal limits. Allowing for portable technique the lungs are clear. Chronic right axillary surgical clips. No acute osseous abnormality identified. Negative visible bowel gas. IMPRESSION: No acute cardiopulmonary abnormality. Electronically Signed   By: Odessa Fleming M.D.   On: 04/20/2023 06:36   CT HEAD WO CONTRAST Result Date: 04/20/2023 CLINICAL DATA:  73 year old female with weakness, atrial fibrillation, neurologic deficit. Hyperglycemia. Balance issues. Generalized weakness. EXAM: CT HEAD WITHOUT CONTRAST TECHNIQUE: Contiguous axial images were obtained from the base of the skull through the vertex without intravenous contrast. RADIATION DOSE REDUCTION: This  exam was performed according to the departmental dose-optimization program which includes automated exposure control, adjustment of the mA and/or kV according to patient size and/or use of iterative reconstruction technique. COMPARISON:  Brain MRI 04/17/2019.  Head CT 07/21/2021. FINDINGS: Brain: Cerebral volume is stable, within normal limits for age. No midline shift, ventriculomegaly, mass effect, evidence of mass lesion, intracranial hemorrhage or evidence of cortically based acute infarction. Moderate patchy bilateral mostly periventricular white matter hypodensity has not significantly changed since 2023. Otherwise maintained gray-white differentiation. ASPECTS 10. Vascular: Heavy calcified atherosclerosis at the skull base. No suspicious intracranial vascular hyperdensity. Skull: Intact.  No acute osseous abnormality identified. Sinuses/Orbits: Paranasal sinuses are stable and well aerated. However, there is new subtotal opacification of the right tympanic cavity and scattered mastoid opacifications new since 2023. Contralateral left middle ear and mastoids remain clear. Other: Negative visible nasopharynx. No acute orbit or scalp soft tissue finding; chronic forehead scarring. IMPRESSION: 1. New right middle ear and mastoid opacification since 2023. Consider Acute Otitis Media, mastoid effusion. 2. No other acute intracranial abnormality. Stable moderate for age white matter disease most commonly due to small vessel ischemia. Electronically Signed   By: Odessa Fleming M.D.   On: 04/20/2023 04:26     Discharge Instructions: Discharge Instructions     Call MD for:  temperature >100.4   Complete by: As directed    Diet - low sodium heart healthy   Complete by: As directed    Diet general   Complete by: As directed    Discharge instructions   Complete by: As directed    To Ms. Rubin Payor or their caretakers,  They were admitted to [facility] on 04/20/2023 for evaluation and treatment  of:  Principal Problem:   Sinus tachycardia Active Problems:   Hypothyroidism   Type 2 diabetes mellitus with other specified complication (HCC)  Resolved Problems:   * No resolved hospital problems. *  The evaluation suggested sinus Tachycardia.   They were discharged from the hospital on 04/21/23. I recommend the following after leaving the hospital:  1. Your thyroid medication,synthroid, had been decreased to 37.5 mg daily. Please take half of your former dose,meaning, you will break your pill into half and take it.  2. For your fast heart rate, Cardiology saw you and they are starting you on metoprolol 25 mg daily.  3. You have a follow up appointment with Dr Lafonda Mosses on 04/30/2023. Please be sure to follow up with your doctor.   For questions about your care plan, until you are able to see your primary doctor: Call [phone]. Dial 0 for the operator. Ask for the internal medicine resident on call.  Kathleen Lime, MD 04/21/2023, 12:29 PM                      Signed: Kathleen Lime, MD Internal Medicine Teaching Service Pager [phone]

## 2023-04-21 NOTE — Inpatient Diabetes Management (Signed)
 Inpatient Diabetes Program Recommendations  AACE/ADA: New Consensus Statement on Inpatient Glycemic Control (2015)  Target Ranges:  Prepandial:   less than 140 mg/dL      Peak postprandial:   less than 180 mg/dL (1-2 hours)      Critically ill patients:  140 - 180 mg/dL   Lab Results  Component Value Date   GLUCAP 239 (H) 04/21/2023   HGBA1C 8.6 (H) 04/20/2023    Review of Glycemic Control  Latest Reference Range & Units 04/20/23 03:24 04/20/23 13:53 04/20/23 16:32 04/20/23 21:40 04/20/23 23:49 04/21/23 06:21 04/21/23 11:48  Glucose-Capillary 70 - 99 mg/dL 161 (H) 096 (H) 045 (H) 242 (H) 324 (H) 361 (H) 239 (H)   Diabetes history: DM 2 Outpatient Diabetes medications: Actos 15 mg Daily, Jardiance 25 mg Daily, Metformin 1000 mg bid, trulicity 0.75 mg weekly Current orders for Inpatient glycemic control:  Novolog 0-9 units tid Ensure enlive bid between meals ( 40 grams carbohydrates)  Inpatient Diabetes Program Recommendations:    -   Add Semglee 8 units -   Add Novolog hs scale  Thanks,  Christena Deem RN, MSN, BC-ADM Inpatient Diabetes Coordinator Team Pager [phone] (8a-5p)

## 2023-04-21 NOTE — Consult Note (Addendum)
 Cardiology Consultation   Patient ID: [name] MRN: [id number]; DOB: 25-Dec-1950  Admit date: 04/20/2023 Date of Consult: 04/21/2023  PCP:  Mercie Eon, MD   [facility] Providers Cardiologist:  Little Ishikawa, MD   {  Patient Profile:   [name] is a 73 y.o. female with a hx of HTN, HLD, DM, hypothyroidism, cognitive impairment, depression who is being seen 04/21/2023 for the evaluation of sinus tachycardia vs Afib at the request of Dr. Ninetta Lights.  History of Present Illness:   [name] has never seen cardiology per chart review. 2018 CT abdomen: Mild coronary artery calcification.   Presented to ED for generalized weakness. ED exam revealed intermittent Afib with RVR and sinus tachycardia. Negative workup for infection, hyperthyroidism, ACS, PE. Negative response to vagal maneuver.  CMP notable for K 3.8, Glu 150, Cr 1.02. HsTN 6. CBC notable for hgb 11.4 and HCT 34.9. A1C 8.6. TSH decreased 0.02 with normal T4 0.8. D-dimer 0.4. UA with positive gluc plus microscopic bacteria.  CT head: new right middle ear and mastoid opacification. Possibly AOM or mastoid effusion. No acute abnormalities.  EKG: Sinus Tachycardia with PAC, HR 120, Left anterior fascicular block, LAD, abnormal R wave progression (vs 06/2021: NSR, HR 96, LAFB, poor R wave progression) ECHO: LVEF 60-65%. - RWMA. Normal RV & LV function. + severe LVH. G1DD. Small pericardial perfusion. (Vs. 06/2017: LVEF 55-60%, mild LVH, G1DD) CXR: no acute cardiopulmonary abnormality.   Cardio was consulted to assess for sinus tachycardia vs Afib. Patient is a poor historian due to cognitive impairment/A & O x 1. Not aware she was in hospital, thinks its 2002 and Monia Sabal is the president. Son was not at bedside to assist with history like previous documentation this hospitalization. Per patient, she reports weakness for unknown duration but denies CP, SOB, palpations, syncope, edema. Denies any recent  illness. Reported drinking 2-3 cups of water per day.   Denies any tobacco, ETOH or drugs. Family history notable for father (heart disease).    Past Medical History:  Diagnosis Date   Advanced care planning/counseling discussion 05/29/2021   Arthritis    Bilateral hip pain 02/24/2015   Borderline glaucoma of both eyes    Depression    Diabetic polyneuropathy (HCC)    Diverticulosis of colon    Dizziness 12/05/2017   Dry eyes, bilateral    Fall 08/02/2021   Feeling of incomplete bladder emptying    Frequent falls 01/08/2018   Gait abnormality 07/19/2021   History of breast cancer oncologist-  dr Marlena Clipper-- per lov note no recurrence   dx 04/ 1999 --- Stage 3B-- s/p  chemotherapy then right mastectomy then concurrent chemoradiation therapy   History of urinary retention    01/ 2018   Hydronephrosis 04/01/2017   Hydronephrosis of right kidney    Hyperlipidemia    Hypertension    Hypokalemia 12/09/2016   Hypomagnesemia 12/09/2016   Hypothyroidism    Insulin dependent type 2 diabetes mellitus [facility])    endocrinologist-  dr Elvera Lennox---  last A1c 8.7 on 02-20-2016   Left leg pain 03/15/2019   LOW BACK PAIN 04/16/2006   Qualifier: Diagnosis of  By: Audria Nine MD, Britta Mccreedy     Lymphedema of upper extremity    right   Macular degeneration, left eye    followed by dr Ashley Royalty   Renal insufficiency    Seizure-like activity (HCC) 04/17/2019   Urgency of urination    Visual hallucinations 01/12/2019   Vitamin D deficiency 08/17/2019  Weight loss 08/17/2019    Past Surgical History:  Procedure Laterality Date   CARPAL TUNNEL RELEASE Right 2017   and tendon repair   CATARACT EXTRACTION W/ INTRAOCULAR LENS  IMPLANT, BILATERAL  2017   CYSTO/  RIGHT RETROGRADE PYELOGRAM/  UNROOFING RIGHT URETEROCELE  09/18/2000   CYSTOSCOPY/RETROGRADE/URETEROSCOPY Right 05/09/2016   Procedure: CYSTOSCOPY and right RETROGRADE;  Surgeon: Bjorn Pippin, MD;  Location: [facility];   Service: Urology;  Laterality: Right;   FINGER SURGERY Left x2 prior to 09-09-2014   I & D EXTREMITY Left 09/09/2014   Procedure: IRRIGATION AND DEBRIDEMENT LEFT THUMB DISTAL PHALANX;  Surgeon: Cindee Salt, MD;  Location: [facility];  Service: Orthopedics;  Laterality: Left;   MASTECTOMY Right 1999   w/  Node dissection's   PORT-A-CATH REMOVAL  05/01/1999   TUBAL LIGATION     VAGINAL HYSTERECTOMY  1970's   WRIST GANGLION EXCISION Right 2016     Home Medications:  Prior to Admission medications   Medication Sig Start Date End Date Taking? Authorizing Provider  busPIRone (BUSPAR) 5 MG tablet TAKE 1 TABLET(5 MG) BY MOUTH TWICE DAILY 11/12/22  Yes Mercie Eon, MD  cyanocobalamin (VITAMIN B12) 500 MCG tablet Take 500 mcg by mouth daily.   Yes [provider]  Dulaglutide (TRULICITY) 0.75 MG/0.5ML SOAJ INJECT THE CONTENTS OF ONE PEN  SUBCUTANEOUSLY WEEKLY AS  DIRECTED 04/09/23  Yes Mercie Eon, MD  empagliflozin (JARDIANCE) 25 MG TABS tablet Take 1 tablet (25 mg total) by mouth daily. 01/06/23  Yes Mercie Eon, MD  glucose 4 GM chewable tablet Chew 4 tablets (16 g total) by mouth as needed for low blood sugar. 05/28/12  Yes Jonah Blue, DO  levothyroxine (SYNTHROID) 75 MCG tablet Take 0.5 tablets (37.5 mcg total) by mouth daily. 04/21/23 04/20/24 Yes Marrianne Mood, MD  metFORMIN (GLUCOPHAGE) 1000 MG tablet Take 1 tablet (1,000 mg total) by mouth 2 (two) times daily with a meal. 03/24/23 03/23/24 Yes Mercie Eon, MD  Nutritional Supplements (GLUCERNA SNACK SHAKE) LIQD Take 1 Dose by mouth 3 (three) times daily as needed. 08/06/17  Yes Earl Lagos, MD  olmesartan (BENICAR) 20 MG tablet Take 1 tablet (20 mg total) by mouth daily. 02/12/23 02/12/24 Yes Reymundo Poll, MD  pioglitazone (ACTOS) 15 MG tablet Take 1 tablet (15 mg total) by mouth daily. 08/20/22 08/20/23 Yes Mercie Eon, MD  rosuvastatin (CRESTOR) 20 MG tablet Take 1 tablet (20 mg total) by mouth daily.  01/16/23  Yes Mercie Eon, MD  venlafaxine XR (EFFEXOR-XR) 150 MG 24 hr capsule Take 1 capsule (150 mg total) by mouth daily with breakfast. 03/17/23 03/16/24 Yes Mercie Eon, MD  latanoprost (XALATAN) 0.005 % ophthalmic solution 1 drop at bedtime. 05/10/22   [provider]    Inpatient Medications: Scheduled Meds:  busPIRone  5 mg Oral BID   enoxaparin (LOVENOX) injection  40 mg Subcutaneous Daily   feeding supplement  237 mL Oral BID BM   insulin aspart  0-9 Units Subcutaneous TID WC   melatonin  3 mg Oral QHS   metoprolol tartrate  25 mg Oral BID   sodium chloride flush  3 mL Intravenous Once   sodium chloride flush  3 mL Intravenous Q12H   venlafaxine XR  150 mg Oral Q breakfast   Continuous Infusions:  PRN Meds: sodium chloride flush  Allergies:    Allergies  Allergen Reactions   Gabapentin Other (See Comments)    Dizziness from 300 mg, tolerates 100 mg Dizziness from  300 mg, tolerates 100 mg    Meclizine Hcl Rash   Metformin Hcl Er Diarrhea    Patient did not tolerate Metformin ER but is able to take lower dose of Metformin   Penicillins Rash    Has patient had a PCN reaction causing immediate rash, facial/tongue/throat swelling, SOB or lightheadedness with hypotension: Yes Has patient had a PCN reaction causing severe rash involving mucus membranes or skin necrosis: No Has patient had a PCN reaction that required hospitalization: pt was in hospital at time of reaction Has patient had a PCN reaction occurring within the last 10 years: No If all of the above answers are "NO", then may proceed with Cephalosporin use.   Sulfa Antibiotics Rash    Social History:   Social History   Socioeconomic History   Marital status: Widowed    Spouse name: Not on file   Number of children: Not on file   Years of education: Not on file   Highest education level: Not on file  Occupational History   Not on file  Tobacco Use   Smoking status: Never   Smokeless  tobacco: Never  Vaping Use   Vaping status: Never Used  Substance and Sexual Activity   Alcohol use: No    Alcohol/week: 0.0 standard drinks of alcohol   Drug use: No   Sexual activity: Not on file  Other Topics Concern   Not on file  Social History Narrative   Not on file   Social Drivers of Health   Financial Resource Strain: Low Risk  (10/30/2022)   Overall Financial Resource Strain (CARDIA)    Difficulty of Paying Living Expenses: Not hard at all  Food Insecurity: Food Insecurity Present (04/20/2023)   Hunger Vital Sign    Worried About Running Out of Food in the Last Year: Sometimes true    Ran Out of Food in the Last Year: Sometimes true  Transportation Needs: No Transportation Needs (04/20/2023)   PRAPARE - Administrator, Civil Service (Medical): No    Lack of Transportation (Non-Medical): No  Physical Activity: Inactive (10/30/2022)   Exercise Vital Sign    Days of Exercise per Week: 0 days    Minutes of Exercise per Session: 0 min  Stress: No Stress Concern Present (10/30/2022)   [employer] of Occupational Health - Occupational Stress Questionnaire    Feeling of Stress : Not at all  Social Connections: Socially Isolated (04/20/2023)   Social Connection and Isolation Panel [NHANES]    Frequency of Communication with Friends and Family: Once a week    Frequency of Social Gatherings with Friends and Family: Once a week    Attends Religious Services: More than 4 times per year    Active Member of [employer] or Organizations: No    Attends Banker Meetings: Never    Marital Status: Divorced  Catering manager Violence: Not At Risk (04/20/2023)   Humiliation, Afraid, Rape, and Kick questionnaire    Fear of Current or Ex-Partner: No    Emotionally Abused: No    Physically Abused: No    Sexually Abused: No    Family History:   Family History  Problem Relation Age of Onset   Heart disease Father    Diabetes Father    Stroke Sister    Anuerysm  Sister 36       brain; maternal half-sister   Heart disease Brother    Anuerysm Brother 61       aortic; maternal  half-brother   Breast cancer Daughter 85       negative genetic testing in 2015   Heart attack Daughter        6-47   Diabetes Paternal Grandmother    Stroke Paternal Grandfather    Anuerysm Brother        NOS type; full brother   Fibroids Daughter        s/p hysterectomy at 32y   Brain cancer Maternal Uncle        dx. older than 22; NOS type   Brain cancer Cousin        maternal 1st cousin; d. early 55s; NOS type   Deafness Paternal Uncle        prelingual     ROS:  Please see the history of present illness.  All other ROS reviewed and negative.     Physical Exam/Data:   Vitals:   04/20/23 2045 04/20/23 2353 04/21/23 0449 04/21/23 0720  BP:  (!) 167/74 (!) 101/58 (!) 151/124  Pulse:  (!) 122 (!) 56   Resp: (!) 22 (!) 22 16 17   Temp:  98.1 F (36.7 C) 98.4 F (36.9 C) 98.2 F (36.8 C)  TempSrc:  Oral  Oral  SpO2: 100% 100% 100% 100%  Weight:      Height:        Intake/Output Summary (Last 24 hours) at 04/21/2023 1213 Last data filed at 04/21/2023 1000 Gross per 24 hour  Intake 600 ml  Output --  Net 600 ml      04/20/2023    5:30 AM 04/20/2023    3:30 AM 02/12/2023    8:59 AM  Last 3 Weights  Weight (lbs) 110 lb 130 lb 1.1 oz 130 lb  Weight (kg) 49.896 kg 59 kg 58.968 kg     Body mass index is 20.12 kg/m.  General:  Laying in bed in no acute distress HEENT: normal Neck: no JVD Vascular: No carotid bruits; Distal pulses 2+ bilaterally Cardiac:  irregular, tachycardiac, no murmurs  Lungs:  clear to auscultation bilaterally, no wheezing, rhonchi or rales  Abd: soft, nontender, no hepatomegaly  Ext: no edema Musculoskeletal:  No deformities, BUE and BLE strength normal and equal Skin: warm and dry  Neuro:  CNs 2-12 intact, no focal abnormalities noted Psych:  Normal affect   EKG:  The EKG was personally reviewed and demonstrates:  Sinus  Tachycardia with PAC, HR 120, Left anterior fascicular block, LAD, abnormal R wave progression (vs 06/2021: NSR, HR 96, LAFB, poor R wave progression) Telemetry:  Telemetry was personally reviewed and demonstrates:  Sinus Tachycardia, HR 120's, jumps to 140's with movement.   Relevant CV Studies: IMPRESSIONS   1. Left ventricular ejection fraction, by estimation, is 60 to 65%. The  left ventricle has normal function. The left ventricle has no regional  wall motion abnormalities. There is severe left ventricular hypertrophy of  the basal-septal segment. Left  ventricular diastolic parameters are consistent with Grade I diastolic  dysfunction (impaired relaxation).   2. Right ventricular systolic function is normal. The right ventricular  size is normal. There is normal pulmonary artery systolic pressure. The  estimated right ventricular systolic pressure is 32.2 mmHg.   3. A small pericardial effusion is present. The pericardial effusion is  anterior to the right ventricle.   4. The mitral valve is normal in structure. No evidence of mitral valve  regurgitation. No evidence of mitral stenosis.   5. The aortic valve is normal in structure. Aortic  valve regurgitation is  not visualized. No aortic stenosis is present.   6. The inferior vena cava is normal in size with greater than 50%  respiratory variability, suggesting right atrial pressure of 3 mmHg.   Laboratory Data:  High Sensitivity Troponin:   Recent Labs  Lab 04/20/23 1508  TROPONINIHS 6     Chemistry Recent Labs  Lab 04/20/23 0350  NA 137  K 3.8  CL 104  CO2 22  GLUCOSE 150*  BUN 18  CREATININE 1.02*  CALCIUM 9.6  GFRNONAA 58*  ANIONGAP 11    Recent Labs  Lab 04/20/23 0350  PROT 6.8  ALBUMIN 3.8  AST 21  ALT 10  ALKPHOS 39  BILITOT 0.4   Lipids No results for input(s): "CHOL", "TRIG", "HDL", "LABVLDL", "LDLCALC", "CHOLHDL" in the last 168 hours.  Hematology Recent Labs  Lab 04/20/23 0350  WBC 8.0   RBC 3.78*  HGB 11.4*  HCT 34.9*  MCV 92.3  MCH 30.2  MCHC 32.7  RDW 13.9  PLT 250   Thyroid  Recent Labs  Lab 04/20/23 1508 04/20/23 2238  TSH 0.027*  --   FREET4  --  0.87    BNPNo results for input(s): "BNP", "PROBNP" in the last 168 hours.  DDimer  Recent Labs  Lab 04/20/23 0350  DDIMER 0.34     Radiology/Studies:  ECHOCARDIOGRAM COMPLETE Result Date: 04/20/2023    ECHOCARDIOGRAM REPORT   Patient Name:   [name] Date of Exam: 04/20/2023 Medical Rec #:  [id number]     Height:       62.0 in Accession #:    [id number]    Weight:       110.0 lb Date of Birth:  [birth year]     BSA:          1.483 m Patient Age:    72 years      BP:           125/75 mmHg Patient Gender: F             HR:           126 bpm. Exam Location:  Inpatient Procedure: 2D Echo (Both Spectral and Color Flow Doppler were utilized during            procedure). Indications:    sinus tachycardia  History:        Patient has no prior history of Echocardiogram examinations,                 most recent 06/28/2017. Risk Factors:Diabetes, Dyslipidemia and                 Hypertension.  Sonographer:    Delcie Roch RDCS Referring Phys: [id number] JEFFREY C HATCHER  Sonographer Comments: Image acquisition challenging due to patient body habitus. IMPRESSIONS  1. Left ventricular ejection fraction, by estimation, is 60 to 65%. The left ventricle has normal function. The left ventricle has no regional wall motion abnormalities. There is severe left ventricular hypertrophy of the basal-septal segment. Left ventricular diastolic parameters are consistent with Grade I diastolic dysfunction (impaired relaxation).  2. Right ventricular systolic function is normal. The right ventricular size is normal. There is normal pulmonary artery systolic pressure. The estimated right ventricular systolic pressure is 32.2 mmHg.  3. A small pericardial effusion is present. The pericardial effusion is anterior to the right ventricle.  4. The mitral  valve is normal in structure. No evidence of mitral valve regurgitation. No evidence of mitral stenosis.  5. The aortic valve is normal in structure. Aortic valve regurgitation is not visualized. No aortic stenosis is present.  6. The inferior vena cava is normal in size with greater than 50% respiratory variability, suggesting right atrial pressure of 3 mmHg. FINDINGS  Left Ventricle: Left ventricular ejection fraction, by estimation, is 60 to 65%. The left ventricle has normal function. The left ventricle has no regional wall motion abnormalities. The left ventricular internal cavity size was normal in size. There is  severe left ventricular hypertrophy of the basal-septal segment. Left ventricular diastolic parameters are consistent with Grade I diastolic dysfunction (impaired relaxation). Normal left ventricular filling pressure. Right Ventricle: The right ventricular size is normal. No increase in right ventricular wall thickness. Right ventricular systolic function is normal. There is normal pulmonary artery systolic pressure. The tricuspid regurgitant velocity is 2.70 m/s, and  with an assumed right atrial pressure of 3 mmHg, the estimated right ventricular systolic pressure is 32.2 mmHg. Left Atrium: Left atrial size was normal in size. Right Atrium: Right atrial size was normal in size. Pericardium: A small pericardial effusion is present. The pericardial effusion is anterior to the right ventricle. Mitral Valve: The mitral valve is normal in structure. No evidence of mitral valve regurgitation. No evidence of mitral valve stenosis. Tricuspid Valve: The tricuspid valve is normal in structure. Tricuspid valve regurgitation is mild . No evidence of tricuspid stenosis. Aortic Valve: The aortic valve is normal in structure. Aortic valve regurgitation is not visualized. No aortic stenosis is present. Pulmonic Valve: The pulmonic valve was normal in structure. Pulmonic valve regurgitation is trivial. No evidence  of pulmonic stenosis. Aorta: The aortic root is normal in size and structure. Venous: The inferior vena cava is normal in size with greater than 50% respiratory variability, suggesting right atrial pressure of 3 mmHg. IAS/Shunts: No atrial level shunt detected by color flow Doppler.  LEFT VENTRICLE PLAX 2D LVIDd:         3.90 cm   Diastology LVIDs:         2.70 cm   LV e' medial:    8.05 cm/s LV PW:         1.00 cm   LV E/e' medial:  6.3 LV IVS:        1.69 cm   LV e' lateral:   12.80 cm/s LVOT diam:     2.20 cm   LV E/e' lateral: 4.0 LV SV:         33 LV SV Index:   22 LVOT Area:     3.80 cm  RIGHT VENTRICLE             IVC RV Basal diam:  2.40 cm     IVC diam: 1.30 cm RV S prime:     19.30 cm/s TAPSE (M-mode): 1.5 cm LEFT ATRIUM             Index        RIGHT ATRIUM          Index LA diam:        2.90 cm 1.96 cm/m   RA Area:     7.20 cm LA Vol (A2C):   26.0 ml 17.53 ml/m  RA Volume:   12.40 ml 8.36 ml/m LA Vol (A4C):   24.1 ml 16.25 ml/m LA Biplane Vol: 24.9 ml 16.79 ml/m  AORTIC VALVE LVOT Vmax:   61.60 cm/s LVOT Vmean:  41.300 cm/s LVOT VTI:    0.086 m  AORTA Ao Root diam:  3.00 cm Ao Asc diam:  2.60 cm MV E velocity: 50.70 cm/s   TRICUSPID VALVE MV A velocity: 121.00 cm/s  TR Peak grad:   29.2 mmHg MV E/A ratio:  0.42         TR Vmax:        270.00 cm/s                              SHUNTS                             Systemic VTI:  0.09 m                             Systemic Diam: 2.20 cm Armanda Magic MD Electronically signed by Armanda Magic MD Signature Date/Time: 04/20/2023/6:15:50 PM    Final    DG Chest Portable 1 View Result Date: 04/20/2023 CLINICAL DATA:  73 year old female with weakness, hyperglycemia. EXAM: PORTABLE CHEST 1 VIEW COMPARISON:  Portable chest 07/21/2021 and earlier. FINDINGS: Portable AP semi upright view at 0536 hours. Improved lung volumes and regressed cardiac silhouette. Normal cardiac size and mediastinal contours. Visualized tracheal air column is within normal limits.  Allowing for portable technique the lungs are clear. Chronic right axillary surgical clips. No acute osseous abnormality identified. Negative visible bowel gas. IMPRESSION: No acute cardiopulmonary abnormality. Electronically Signed   By: Odessa Fleming M.D.   On: 04/20/2023 06:36   CT HEAD WO CONTRAST Result Date: 04/20/2023 CLINICAL DATA:  73 year old female with weakness, atrial fibrillation, neurologic deficit. Hyperglycemia. Balance issues. Generalized weakness. EXAM: CT HEAD WITHOUT CONTRAST TECHNIQUE: Contiguous axial images were obtained from the base of the skull through the vertex without intravenous contrast. RADIATION DOSE REDUCTION: This exam was performed according to the departmental dose-optimization program which includes automated exposure control, adjustment of the mA and/or kV according to patient size and/or use of iterative reconstruction technique. COMPARISON:  Brain MRI 04/17/2019.  Head CT 07/21/2021. FINDINGS: Brain: Cerebral volume is stable, within normal limits for age. No midline shift, ventriculomegaly, mass effect, evidence of mass lesion, intracranial hemorrhage or evidence of cortically based acute infarction. Moderate patchy bilateral mostly periventricular white matter hypodensity has not significantly changed since 2023. Otherwise maintained gray-white differentiation. ASPECTS 10. Vascular: Heavy calcified atherosclerosis at the skull base. No suspicious intracranial vascular hyperdensity. Skull: Intact.  No acute osseous abnormality identified. Sinuses/Orbits: Paranasal sinuses are stable and well aerated. However, there is new subtotal opacification of the right tympanic cavity and scattered mastoid opacifications new since 2023. Contralateral left middle ear and mastoids remain clear. Other: Negative visible nasopharynx. No acute orbit or scalp soft tissue finding; chronic forehead scarring. IMPRESSION: 1. New right middle ear and mastoid opacification since 2023. Consider Acute  Otitis Media, mastoid effusion. 2. No other acute intracranial abnormality. Stable moderate for age white matter disease most commonly due to small vessel ischemia. Electronically Signed   By: Odessa Fleming M.D.   On: 04/20/2023 04:26     Assessment and Plan:   Sinus Tachycardia with PAC's - Patient is a poor historian due to cognitive impairment/A & O x 1. Not aware she was in hospital, thinks its 2002 and Monia Sabal is the president. Son was not at bedside to assist with history like previous documentation this hospitalization. Per patient, she reports weakness for unknown duration but denies CP, SOB,  palpations, syncope, edema. Denies any recent illness. Reported drinking 2-3 cups of water per day.  - CMP notable for K 3.8, Glu 150, Cr 1.02. HsTN 6. CBC notable for hgb 11.4 and HCT 34.9. A1C 8.6. TSH decreased 0.02 with normal T4 0.8. D-dimer 0.4. UA with positive gluc plus microscopic bacteria.  - EKG: Sinus Tachycardia with PAC, HR 120, Left anterior fascicular block, LAD, abnormal R wave progression (vs 06/2021: NSR, HR 96, LAFB, poor R wave progression) - Ordered Mg  - Ordered Lopressor 25 mg BID to suppress PAC's.  - unknown cause for sinus tachy at this time. Suspect hyperglycemia, anemia, and dehydration can be contributing. Also CT of head noted possible AOM or mastoid effusion, can consider treatment. Negative CBC for infection, d-dimer, Hypothyroidism.   HTN  - Elevated this admission w/o meds - can consider restart Olmesartan if Cr normalize. Defer to primary.   HLD - ordered Lipid panel for am labs.  - can restart Crestor 20 mg. Defer to primary.  DM  - Currently on Trulicity 0.75, Jardiance, Metformin, Pioglitazone 15 mg - managed by primary   Risk Assessment/Risk Scores:       For questions or updates, please contact [facility] Please consult [email or web address] for contact info under    Signed, Basilio Cairo, PA-C  04/21/2023 12:13 PM   Patient seen and  examined.  Agree with above documentation.  [name] is a 73 year old female with a history of hypothyroidism, hypertension, T2DM, cognitive impairment who we are consulted by Dr. Ninetta Lights for evaluation of tachycardia.  She presented to ED with weakness.  Initial vital signs notable for BP 146/78, pulse 124, SpO2 100% on room air.  There is some concern for atrial fibrillation with RVR but on review of EKGs and telemetry, appears sinus tachycardia with PACs.  Head CT showed findings possibly consistent with acute otitis media, otherwise no acute abnormalities.  Chest x-ray unremarkable.  Echocardiogram showed EF 60 to 65%, grade 1 diastolic dysfunction, normal RV function, small pericardial effusion, no significant valvular disease.  Labs notable for creatinine 1.02, normal LFTs, hemoglobin 11.4, D-dimer 0.34, TSH is low at 0.027, free T4 normal at 0.87, troponin 6.  On exam, patient is alert, tachycardic, regular, no murmurs, lungs CTAB, no LE edema or JVD.  For her tachycardia, review of telemetry and EKGs is consistent with sinus tachycardia with PACs.  She is having frequent ectopy, will start metoprolol 25 mg twice daily.  Continue to monitor on telemetry.  Little Ishikawa, MD

## 2023-04-22 LAB — T3: T3, Total: 69 ng/dL — ABNORMAL LOW (ref 71–180)

## 2023-04-29 NOTE — Progress Notes (Unsigned)
 [facility]: Clinic Note  Subjective:  History of Present Illness: [name] is a 73 y.o. year old female with cognitive impairment who presents for hospital follow up and T2DM management. She's here with her son today.  She was admitted 3/24-3/25 for weakness, found to have sinus tachycardia. Cards started Metoprolol Tartrate 25mg  BID. IM decreased her synthroid. Since hospital discharge, she has been feeling well. No falls. No dizziness, lightheadedness, chest pain, or palpitations.   Having trouble splitting the tiny synthroid in half.   For her T2DM, her A1C was 8.6. I reviewed her CGM - she's been having highs in the afternoons and evenings. Avoiding all sugary drinks. Her son is working hard to ensure she eats healthy food without a lot of sugar.    Please refer to Assessment and Plan below for full details in Problem-Based Charting.   Past Medical History:  Patient Active Problem List   Diagnosis Date Noted   Sinus tachycardia 04/20/2023   Type 2 diabetes mellitus with other specified complication (HCC) 04/20/2023   Healthcare maintenance 04/10/2022   Urinary incontinence, mixed 06/16/2020   Weight loss 08/17/2019   Cognitive impairment 01/13/2019   Insomnia 06/11/2018   Osteoarthritis of facet joint of lumbar spine 02/04/2017   Sciatica of left side without back pain 01/30/2017   Carpal tunnel syndrome of right wrist 12/09/2016   Primary osteoarthritis of first carpometacarpal joint of right hand 07/06/2015   Essential hypertension 08/13/2012   Glaucoma associated with systemic syndromes 08/07/2012   Hypothyroidism 04/16/2006   Anemia 04/16/2006   Depression 04/16/2006   BREAST CANCER, HX OF 04/16/2006   Hyperlipidemia 10/13/2003   Type 2 diabetes mellitus with diabetic polyneuropathy, with long-term current use of insulin (HCC) 04/16/1991      Medications:  Current Outpatient Medications:    busPIRone (BUSPAR) 5 MG tablet, TAKE 1  TABLET(5 MG) BY MOUTH TWICE DAILY, Disp: 180 tablet, Rfl: 3   cyanocobalamin (VITAMIN B12) 500 MCG tablet, Take 500 mcg by mouth daily., Disp: , Rfl:    Dulaglutide (TRULICITY) 0.75 MG/0.5ML SOAJ, INJECT THE CONTENTS OF ONE PEN  SUBCUTANEOUSLY WEEKLY AS  DIRECTED, Disp: 4 mL, Rfl: 5   empagliflozin (JARDIANCE) 25 MG TABS tablet, Take 1 tablet (25 mg total) by mouth daily., Disp: 90 tablet, Rfl: 3   glucose 4 GM chewable tablet, Chew 4 tablets (16 g total) by mouth as needed for low blood sugar., Disp: 50 tablet, Rfl: 12   latanoprost (XALATAN) 0.005 % ophthalmic solution, 1 drop at bedtime., Disp: , Rfl:    levothyroxine (SYNTHROID) 50 MCG tablet, Take 1 tablet (50 mcg total) by mouth daily., Disp: 90 tablet, Rfl: 3   metFORMIN (GLUCOPHAGE) 1000 MG tablet, Take 1 tablet (1,000 mg total) by mouth 2 (two) times daily with a meal., Disp: 180 tablet, Rfl: 3   metoprolol tartrate (LOPRESSOR) 25 MG tablet, Take 1 tablet (25 mg total) by mouth 2 (two) times daily., Disp: 180 tablet, Rfl: 3   Nutritional Supplements (GLUCERNA SNACK SHAKE) LIQD, Take 1 Dose by mouth 3 (three) times daily as needed., Disp: 30 Bottle, Rfl: 3   olmesartan (BENICAR) 20 MG tablet, Take 1 tablet (20 mg total) by mouth daily., Disp: 90 tablet, Rfl: 3   pioglitazone (ACTOS) 15 MG tablet, Take 1 tablet (15 mg total) by mouth daily., Disp: 90 tablet, Rfl: 3   rosuvastatin (CRESTOR) 20 MG tablet, Take 1 tablet (20 mg total) by mouth daily., Disp: 90 tablet, Rfl: 3  venlafaxine XR (EFFEXOR-XR) 150 MG 24 hr capsule, Take 1 capsule (150 mg total) by mouth daily with breakfast., Disp: 90 capsule, Rfl: 3   Allergies: Allergies  Allergen Reactions   Gabapentin Other (See Comments)    Dizziness from 300 mg, tolerates 100 mg Dizziness from 300 mg, tolerates 100 mg    Meclizine Hcl Rash   Metformin Hcl Er Diarrhea    Patient did not tolerate Metformin ER but is able to take lower dose of Metformin   Penicillins Rash    Has patient had  a PCN reaction causing immediate rash, facial/tongue/throat swelling, SOB or lightheadedness with hypotension: Yes Has patient had a PCN reaction causing severe rash involving mucus membranes or skin necrosis: No Has patient had a PCN reaction that required hospitalization: pt was in hospital at time of reaction Has patient had a PCN reaction occurring within the last 10 years: No If all of the above answers are "NO", then may proceed with Cephalosporin use.   Sulfa Antibiotics Rash       Objective:   Vitals: Vitals:   04/30/23 1100  BP: 116/64  Pulse: 94  Temp: 99.3 F (37.4 C)  SpO2: 100%     Physical Exam: Physical Exam Constitutional:      Appearance: Normal appearance.  Cardiovascular:     Rate and Rhythm: Normal rate and regular rhythm.     Pulses: Normal pulses.     Heart sounds: Normal heart sounds.  Pulmonary:     Effort: Pulmonary effort is normal.     Breath sounds: Normal breath sounds.  Neurological:     Mental Status: She is alert.      Data: Labs, imaging, and micro were reviewed in Epic. Refer to Assessment and Plan below for full details in Problem-Based Charting.  Assessment & Plan:  Sinus tachycardia - I'm not sure if this was in the setting of dehydration or overtreatment with synthroid, but it has improved with metoprolol tartrate 25mg  BID - Continue metoprolol tartrate 25mg  BID. I have sent a refill to her pharmacy   Hypothyroidism - In the hospital, the doctors attempted to cut the levothyroxine daily to 37.84mcg daily, but patient's son is having a hard time cutting these tiny pills in half - decrease to daily, which I have mailed in to her pharmacy  - repeat TSH in 8 weeks  Type 2 diabetes mellitus with diabetic polyneuropathy, with long-term current use of insulin (HCC) - [name] wore the CGM for 14 days. The average reading was 219, % time in target was 31, % time below target was 0, and % time above target was. 69.  Intervention will be to continue working on diet and lifestyle changes. The patient will be scheduled to see me (Dr Lafonda Mosses) for a final appointment.   -Continue Metformin 1g BID, Trulicity 0.75mg  weekly, Jardiance 25mg  daily, Actos 15mg  daily  - I considered starting insulin, but patient and son are hesitant to do this. I also considered increasing trulicity, but since she has a history of weight loss and is a normal BMI, I don't want to curb her appetite - continue current mgmt   BREAST CANCER, HX OF - Plan for MMG in 07/2023, which I ordered today. After that, could consider stopping cancer screening - will need to have a risk/benefit discussion  Weight loss - Weight is now stable, at 129 - continue glucerna shakes - do not increase GLP1  Essential hypertension - chronic and stable - We  had stopped Azor pill last time and this was appropriate - Continue Olmesartan 20mg  daily and Metoprolol 25mg  BID (more for HR than for BP)  Cognitive impairment - chronic & stable - she has strong family support with her son and a home aide - Last MOCA was 60 in 2023       Patient will follow up in 2 months for TSH check  Mercie Eon, MD

## 2023-04-30 ENCOUNTER — Other Ambulatory Visit: Payer: Self-pay

## 2023-04-30 ENCOUNTER — Encounter: Payer: Self-pay | Admitting: Internal Medicine

## 2023-04-30 ENCOUNTER — Ambulatory Visit: Payer: 59 | Admitting: Internal Medicine

## 2023-04-30 VITALS — BP 116/64 | HR 94 | Temp 99.3°F | Ht 63.0 in | Wt 129.7 lb

## 2023-04-30 DIAGNOSIS — Z853 Personal history of malignant neoplasm of breast: Secondary | ICD-10-CM | POA: Diagnosis not present

## 2023-04-30 DIAGNOSIS — I10 Essential (primary) hypertension: Secondary | ICD-10-CM | POA: Diagnosis not present

## 2023-04-30 DIAGNOSIS — R634 Abnormal weight loss: Secondary | ICD-10-CM

## 2023-04-30 DIAGNOSIS — R4189 Other symptoms and signs involving cognitive functions and awareness: Secondary | ICD-10-CM

## 2023-04-30 DIAGNOSIS — E039 Hypothyroidism, unspecified: Secondary | ICD-10-CM | POA: Diagnosis not present

## 2023-04-30 DIAGNOSIS — E1142 Type 2 diabetes mellitus with diabetic polyneuropathy: Secondary | ICD-10-CM | POA: Diagnosis not present

## 2023-04-30 DIAGNOSIS — Z7985 Long-term (current) use of injectable non-insulin antidiabetic drugs: Secondary | ICD-10-CM

## 2023-04-30 DIAGNOSIS — Z1231 Encounter for screening mammogram for malignant neoplasm of breast: Secondary | ICD-10-CM

## 2023-04-30 DIAGNOSIS — R000 Tachycardia, unspecified: Secondary | ICD-10-CM | POA: Diagnosis not present

## 2023-04-30 DIAGNOSIS — Z794 Long term (current) use of insulin: Secondary | ICD-10-CM

## 2023-04-30 DIAGNOSIS — Z7984 Long term (current) use of oral hypoglycemic drugs: Secondary | ICD-10-CM | POA: Diagnosis not present

## 2023-04-30 MED ORDER — EMPAGLIFLOZIN 25 MG PO TABS: 25.0000 mg | ORAL_TABLET | Freq: Every day | ORAL | 3 refills | Status: DC

## 2023-04-30 MED ORDER — LEVOTHYROXINE SODIUM 50 MCG PO TABS
50.0000 ug | ORAL_TABLET | Freq: Every day | ORAL | 3 refills | Status: DC
Start: 2023-04-30 — End: 2023-05-12

## 2023-04-30 MED ORDER — METOPROLOL TARTRATE 25 MG PO TABS: 25.0000 mg | ORAL_TABLET | Freq: Two times a day (BID) | ORAL | 3 refills | Status: DC

## 2023-04-30 MED ORDER — METFORMIN HCL 1000 MG PO TABS: 1000.0000 mg | ORAL_TABLET | Freq: Two times a day (BID) | ORAL | 3 refills | Status: DC

## 2023-04-30 NOTE — Assessment & Plan Note (Signed)
-   [name] wore the CGM for 14 days. The average reading was 219, % time in target was 31, % time below target was 0, and % time above target was. 69. Intervention will be to continue working on diet and lifestyle changes. The patient will be scheduled to see me (Dr Lafonda Mosses) for a final appointment.   -Continue Metformin 1g BID, Trulicity 0.75mg  weekly, Jardiance 25mg  daily, Actos 15mg  daily  - I considered starting insulin, but patient and son are hesitant to do this. I also considered increasing trulicity, but since she has a history of weight loss and is a normal BMI, I don't want to curb her appetite - continue current mgmt

## 2023-04-30 NOTE — Assessment & Plan Note (Signed)
-   chronic and stable - We had stopped Azor pill last time and this was appropriate - Continue Olmesartan 20mg  daily and Metoprolol 25mg  BID (more for HR than for BP)

## 2023-04-30 NOTE — Assessment & Plan Note (Addendum)
-   In the hospital, the doctors attempted to cut the levothyroxine daily to 37.92mcg daily, but patient's son is having a hard time cutting these tiny pills in half - decrease to daily, which I have mailed in to her pharmacy  - repeat TSH in 8 weeks

## 2023-04-30 NOTE — Assessment & Plan Note (Signed)
-   Weight is now stable, at 129 - continue glucerna shakes - do not increase GLP1

## 2023-04-30 NOTE — Assessment & Plan Note (Signed)
-   chronic & stable - she has strong family support with her son and a home aide - Last MOCA was 14 in 2023

## 2023-04-30 NOTE — Patient Instructions (Signed)
 Thank you, [name] for allowing [place] to provide your care today. Today we discussed your heart rate, your diabetes, and your thyroid.    I have ordered the following labs for you:  Lab Orders  No laboratory test(s) ordered today     Tests ordered today:  None  Referrals ordered today:   Referral Orders  No referral(s) requested today     I have ordered the following medication/changed the following medications:   Stop the following medications: Medications Discontinued During This Encounter  Medication Reason   levothyroxine (SYNTHROID) 75 MCG tablet Reorder   metFORMIN (GLUCOPHAGE) 1000 MG tablet Reorder   empagliflozin (JARDIANCE) 25 MG TABS tablet Reorder   metoprolol tartrate (LOPRESSOR) 25 MG tablet Reorder     Start the following medications: Meds ordered this encounter  Medications   levothyroxine (SYNTHROID) 50 MCG tablet    Sig: Take 1 tablet (50 mcg total) by mouth daily.    Dispense:  90 tablet    Refill:  3   metFORMIN (GLUCOPHAGE) 1000 MG tablet    Sig: Take 1 tablet (1,000 mg total) by mouth 2 (two) times daily with a meal.    Dispense:  180 tablet    Refill:  3   empagliflozin (JARDIANCE) 25 MG TABS tablet    Sig: Take 1 tablet (25 mg total) by mouth daily.    Dispense:  90 tablet    Refill:  3   metoprolol tartrate (LOPRESSOR) 25 MG tablet    Sig: Take 1 tablet (25 mg total) by mouth 2 (two) times daily.    Dispense:  180 tablet    Refill:  3     Return in about 2 months (around 06/30/2023) for check TSH.    Remember:  - I have sent in a new prescription for Levothyroxine daily to your pharmacy. I'll check your thyroid levels at your appointment in 2 months.  - I have sent in a refill for your Metoprolol to your mail pharmacy. Keep taking this - your heart looks better - Your blood sugars are a little bit high, but I understand that you don't want to start insulin right now. Keep working on avoiding very sugary foods, especially at  lunch time, when your sugars tend to run high. We'll check your A1C at your follow up visit   Should you have any questions or concerns please call the internal medicine clinic at [phone].     Mercie Eon, MD Faculty, Internal Medicine Teaching Progam [facility]

## 2023-04-30 NOTE — Assessment & Plan Note (Signed)
-   I'm not sure if this was in the setting of dehydration or overtreatment with synthroid, but it has improved with metoprolol tartrate 25mg  BID - Continue metoprolol tartrate 25mg  BID. I have sent a refill to her pharmacy

## 2023-04-30 NOTE — Assessment & Plan Note (Signed)
-   Plan for MMG in 07/2023, which I ordered today. After that, could consider stopping cancer screening - will need to have a risk/benefit discussion

## 2023-05-12 ENCOUNTER — Encounter: Payer: Self-pay | Admitting: Internal Medicine

## 2023-05-12 ENCOUNTER — Ambulatory Visit (INDEPENDENT_AMBULATORY_CARE_PROVIDER_SITE_OTHER): Payer: 59 | Admitting: Podiatry

## 2023-05-12 ENCOUNTER — Ambulatory Visit: Payer: Self-pay | Admitting: Internal Medicine

## 2023-05-12 ENCOUNTER — Encounter: Payer: Self-pay | Admitting: Podiatry

## 2023-05-12 DIAGNOSIS — E1142 Type 2 diabetes mellitus with diabetic polyneuropathy: Secondary | ICD-10-CM

## 2023-05-12 DIAGNOSIS — B351 Tinea unguium: Secondary | ICD-10-CM | POA: Diagnosis not present

## 2023-05-12 DIAGNOSIS — M79675 Pain in left toe(s): Secondary | ICD-10-CM | POA: Diagnosis not present

## 2023-05-12 DIAGNOSIS — M79674 Pain in right toe(s): Secondary | ICD-10-CM | POA: Diagnosis not present

## 2023-05-12 MED ORDER — LEVOTHYROXINE SODIUM 50 MCG PO TABS
50.0000 ug | ORAL_TABLET | Freq: Every day | ORAL | 3 refills | Status: DC
Start: 2023-05-12 — End: 2023-10-13

## 2023-05-12 MED ORDER — LEVOTHYROXINE SODIUM 50 MCG PO TABS: 50.0000 ug | ORAL_TABLET | Freq: Every day | ORAL | Status: DC

## 2023-05-12 NOTE — Telephone Encounter (Signed)
 Pt 's son called / informed of rx for Synthroid 50 has been sent to the pharmacy.

## 2023-05-12 NOTE — Telephone Encounter (Signed)
 Copied from CRM [id number]. Topic: Clinical - Prescription Issue >> May 12, 2023  2:35 PM Adrianna P wrote: Reason for CRM: pharmacy gave patient 75 mcg instead of 50   Chief Complaint: Medication issue  Additional Notes: Per son, pharmacy sent out Synthroid 75mcg instead of 50mcg that was ordered on April 2nd. This RN [employer] [facility] spoke with Pharmacist Emmy Harper, who advises that they have not rec'd the new order for 50mcg. Per Alfredo Ano, the 75mcg that was delivered is a auto-continuation of the previous order. Call made to CAL to make aware. Sending note HP to clinic. Will notify patient and provide update.   Reason for Disposition  [1] Prescription refill request for ESSENTIAL medicine (i.e., likelihood of harm to patient if not taken) AND [2] triager unable to refill per department policy  Answer Assessment - Initial Assessment Questions 1. DRUG NAME: "What medicine do you need to have refilled?"     Levothyroxine  2. REFILLS REMAINING: "How many refills are remaining?" (Note: The label on the medicine or pill bottle will show how many refills are remaining. If there are no refills remaining, then a renewal may be needed.)     3  3. EXPIRATION DATE: "What is the expiration date?" (Note: The label states when the prescription will expire, and thus can no longer be refilled.)     . 4. PRESCRIBING HCP: "Who prescribed it?" Reason: If prescribed by specialist, call should be referred to that group.     Dr. Jarvis Mesa  5. SYMPTOMS: "Do you have any symptoms?"     None  6. PREGNANCY: "Is there any chance that you are pregnant?" "When was your last menstrual period?"     none  Protocols used: Medication Refill and Renewal Call-A-AH

## 2023-05-12 NOTE — Progress Notes (Signed)
 This patient returns to my office for at risk foot care.  This patient requires this care by a professional since this patient will be at risk due to having diabetic neuropathy.This patient is unable to cut nails herself since the patient cannot reach her nails.These nails are painful walking and wearing shoes.  This patient presents for at risk foot care today.  General Appearance  Alert, conversant and in no acute stress.  Vascular  Dorsalis pedis and posterior tibial  pulses are palpable  bilaterally.  Capillary return is within normal limits  bilaterally. Temperature is within normal limits  bilaterally.  Neurologic  Senn-Weinstein monofilament wire test within normal limits  bilaterally. Muscle power within normal limits bilaterally.  Nails Thick disfigured discolored nails with subungual debris  from hallux to fifth toes bilaterally. No evidence of bacterial infection or drainage bilaterally.  Orthopedic  No limitations of motion  feet .  No crepitus or effusions noted.  No bony pathology or digital deformities noted.  Skin  normotropic skin with no porokeratosis noted bilaterally.  No signs of infections or ulcers noted.   HD 5th  B/L asymptomatic.  Onychomycosis  Pain in right toes  Pain in left toes  Consent was obtained for treatment procedures.   Mechanical debridement of nails 1-5  bilaterally performed with a nail nipper.  Filed with dremel without incident.    Return office visit   4  months                   Told patient to return for periodic foot care and evaluation due to potential at risk complications.   Ruffin Cotton DPM

## 2023-05-22 ENCOUNTER — Encounter: Payer: Self-pay | Admitting: Physician Assistant

## 2023-05-22 ENCOUNTER — Ambulatory Visit: Attending: Physician Assistant | Admitting: Physician Assistant

## 2023-05-22 VITALS — BP 130/64 | HR 87 | Ht 63.0 in | Wt 128.2 lb

## 2023-05-22 DIAGNOSIS — I4719 Other supraventricular tachycardia: Secondary | ICD-10-CM | POA: Diagnosis not present

## 2023-05-22 DIAGNOSIS — I10 Essential (primary) hypertension: Secondary | ICD-10-CM | POA: Diagnosis not present

## 2023-05-22 NOTE — Progress Notes (Signed)
 Cardiology Office Note:  .   Date:  05/22/2023  ID:  [name], DOB 07-26-1950, MRN [id number] PCP: Driscilla George, MD  [facility] Providers Cardiologist:  Wendie Hamburg, MD     History of Present Illness: .   [name] is a 73 y.o. female with past medical history of HTN, HLD, DM2, hypothyroidism, cognitive impairment and depression.  She presented to the ED recently in March 2025 with generalized weakness.  She was noted to be in intermittent sinus tachycardia with PACs.  Initially, there was some concern for possible A-fib with RVR, however EKG has been personally reviewed, and is more consistent with sinus tachycardia with PACs.  Workup was negative for infection.  D-dimer 0.4.  Hemoglobin 11.4.  CT of the head showed new right middle ear and mastoid opacification.  Echocardiogram showed EF 60 to 65%, no regional wall motion abnormality, severe LVH, normal RV, small pericardial effusion.  Cardiology service was consulted.  Patient was started on metoprolol  25 mg twice a day.  Patient presents today for cardiology follow-up.  EKG shows her heart rate is very well-controlled.  She has no lower extremity edema.  Her lung is clear on physical exam.  She is alert and orientated x 2, to self and location, she think the year is 82, she does not know who is the current president of [place]  is.  Overall, she has been doing well from the cardiac perspective and can follow-up with Dr. Alda Amas in 6 months.  ROS:   She denies chest pain, palpitations, dyspnea, pnd, orthopnea, n, v, dizziness, syncope, edema, weight gain, or early satiety. All other systems reviewed and are otherwise negative except as noted above.    Studies Reviewed: Aaron Aas   EKG Interpretation Date/Time:  Thursday May 22 2023 10:26:08 EDT Ventricular Rate:  87 PR Interval:  154 QRS Duration:  94 QT Interval:  384 QTC Calculation: 462 R Axis:   -67  Text Interpretation: Normal sinus rhythm  No  significant ST-T wave changes Confirmed by Ervin Heath [phone]) on 05/22/2023 2:57:38 PM    Cardiac Studies & Procedures   ______________________________________________________________________________________________     ECHOCARDIOGRAM  ECHOCARDIOGRAM COMPLETE 04/20/2023  Narrative ECHOCARDIOGRAM REPORT    Patient Name:   [name] Date of Exam: 04/20/2023 Medical Rec #:  [id number]     Height:       62.0 in Accession #:    [id number]    Weight:       110.0 lb Date of Birth:  10-Mar-1950     BSA:          1.483 m Patient Age:    72 years      BP:           125/75 mmHg Patient Gender: F             HR:           126 bpm. Exam Location:  Inpatient  Procedure: 2D Echo (Both Spectral and Color Flow Doppler were utilized during procedure).  Indications:    sinus tachycardia  History:        Patient has no prior history of Echocardiogram examinations, most recent 06/28/2017. Risk Factors:Diabetes, Dyslipidemia and Hypertension.  Sonographer:    Dione Franks RDCS Referring Phys: [id number] JEFFREY C HATCHER   Sonographer Comments: Image acquisition challenging due to patient body habitus. IMPRESSIONS   1. Left ventricular ejection fraction, by estimation, is 60 to 65%. The left ventricle has normal function. The left  ventricle has no regional wall motion abnormalities. There is severe left ventricular hypertrophy of the basal-septal segment. Left ventricular diastolic parameters are consistent with Grade I diastolic dysfunction (impaired relaxation). 2. Right ventricular systolic function is normal. The right ventricular size is normal. There is normal pulmonary artery systolic pressure. The estimated right ventricular systolic pressure is 32.2 mmHg. 3. A small pericardial effusion is present. The pericardial effusion is anterior to the right ventricle. 4. The mitral valve is normal in structure. No evidence of mitral valve regurgitation. No evidence of mitral stenosis. 5. The aortic  valve is normal in structure. Aortic valve regurgitation is not visualized. No aortic stenosis is present. 6. The inferior vena cava is normal in size with greater than 50% respiratory variability, suggesting right atrial pressure of 3 mmHg.  FINDINGS Left Ventricle: Left ventricular ejection fraction, by estimation, is 60 to 65%. The left ventricle has normal function. The left ventricle has no regional wall motion abnormalities. The left ventricular internal cavity size was normal in size. There is severe left ventricular hypertrophy of the basal-septal segment. Left ventricular diastolic parameters are consistent with Grade I diastolic dysfunction (impaired relaxation). Normal left ventricular filling pressure.  Right Ventricle: The right ventricular size is normal. No increase in right ventricular wall thickness. Right ventricular systolic function is normal. There is normal pulmonary artery systolic pressure. The tricuspid regurgitant velocity is 2.70 m/s, and with an assumed right atrial pressure of 3 mmHg, the estimated right ventricular systolic pressure is 32.2 mmHg.  Left Atrium: Left atrial size was normal in size.  Right Atrium: Right atrial size was normal in size.  Pericardium: A small pericardial effusion is present. The pericardial effusion is anterior to the right ventricle.  Mitral Valve: The mitral valve is normal in structure. No evidence of mitral valve regurgitation. No evidence of mitral valve stenosis.  Tricuspid Valve: The tricuspid valve is normal in structure. Tricuspid valve regurgitation is mild . No evidence of tricuspid stenosis.  Aortic Valve: The aortic valve is normal in structure. Aortic valve regurgitation is not visualized. No aortic stenosis is present.  Pulmonic Valve: The pulmonic valve was normal in structure. Pulmonic valve regurgitation is trivial. No evidence of pulmonic stenosis.  Aorta: The aortic root is normal in size and structure.  Venous:  The inferior vena cava is normal in size with greater than 50% respiratory variability, suggesting right atrial pressure of 3 mmHg.  IAS/Shunts: No atrial level shunt detected by color flow Doppler.   LEFT VENTRICLE PLAX 2D LVIDd:         3.90 cm   Diastology LVIDs:         2.70 cm   LV e' medial:    8.05 cm/s LV PW:         1.00 cm   LV E/e' medial:  6.3 LV IVS:        1.69 cm   LV e' lateral:   12.80 cm/s LVOT diam:     2.20 cm   LV E/e' lateral: 4.0 LV SV:         33 LV SV Index:   22 LVOT Area:     3.80 cm   RIGHT VENTRICLE             IVC RV Basal diam:  2.40 cm     IVC diam: 1.30 cm RV S prime:     19.30 cm/s TAPSE (M-mode): 1.5 cm  LEFT ATRIUM  Index        RIGHT ATRIUM          Index LA diam:        2.90 cm 1.96 cm/m   RA Area:     7.20 cm LA Vol (A2C):   26.0 ml 17.53 ml/m  RA Volume:   12.40 ml 8.36 ml/m LA Vol (A4C):   24.1 ml 16.25 ml/m LA Biplane Vol: 24.9 ml 16.79 ml/m AORTIC VALVE LVOT Vmax:   61.60 cm/s LVOT Vmean:  41.300 cm/s LVOT VTI:    0.086 m  AORTA Ao Root diam: 3.00 cm Ao Asc diam:  2.60 cm  MV E velocity: 50.70 cm/s   TRICUSPID VALVE MV A velocity: 121.00 cm/s  TR Peak grad:   29.2 mmHg MV E/A ratio:  0.42         TR Vmax:        270.00 cm/s  SHUNTS Systemic VTI:  0.09 m Systemic Diam: 2.20 cm  Gaylyn Keas MD Electronically signed by Gaylyn Keas MD Signature Date/Time: 04/20/2023/6:15:50 PM    Final          ______________________________________________________________________________________________      Risk Assessment/Calculations:            Physical Exam:   VS:  BP 130/64 (BP Location: Left Arm, Patient Position: Sitting)   Pulse 87   Ht 5\' 3"  (1.6 m)   Wt 128 lb 3.2 oz (58.2 kg)   SpO2 90%   BMI 22.71 kg/m    Wt Readings from Last 3 Encounters:  05/22/23 128 lb 3.2 oz (58.2 kg)  04/30/23 129 lb 11.2 oz (58.8 kg)  04/20/23 110 lb (49.9 kg)    GEN: Well nourished, well developed in no acute  distress NECK: No JVD; No carotid bruits CARDIAC: RRR, no murmurs, rubs, gallops RESPIRATORY:  Clear to auscultation without rales, wheezing or rhonchi  ABDOMEN: Soft, non-tender, non-distended EXTREMITIES:  No edema; No deformity   ASSESSMENT AND PLAN: .    Atrial tachycardia: Well-controlled on metoprolol  tartrate 25 mg twice a day.  Will continue on the current therapy.  Hypertension: Blood pressure stable.       Dispo: Follow-up with Dr. Alda Amas in 6 months  Signed, Aayushi Solorzano, [place]

## 2023-05-22 NOTE — Patient Instructions (Signed)
 Medication Instructions:  NO CHANGES *If you need a refill on your cardiac medications before your next appointment, please call your pharmacy*  Lab Work: NO LABS If you have labs (blood work) drawn today and your tests are completely normal, you will receive your results only by: MyChart Message (if you have MyChart) OR A paper copy in the mail If you have any lab test that is abnormal or we need to change your treatment, we will call you to review the results.  Testing/Procedures: NO TESTING  Follow-Up: At [facility], you and your health needs are our priority.  As part of our continuing mission to provide you with exceptional heart care, our providers are all part of one team.  This team includes your primary Cardiologist (physician) and Advanced Practice Providers or APPs (Physician Assistants and Nurse Practitioners) who all work together to provide you with the care you need, when you need it.  Your next appointment:   6 month(s)  Provider:   Little Ishikawa, MD   We recommend signing up for the patient portal called "MyChart".  Sign up information is provided on this After Visit Summary.  MyChart is used to connect with patients for Virtual Visits (Telemedicine).  Patients are able to view lab/test results, encounter notes, upcoming appointments, etc.  Non-urgent messages can be sent to your provider as well.   To learn more about what you can do with MyChart, go to [email or web address].   Other Instructions   1st Floor: - Lobby - Registration  - Pharmacy  - Lab - Cafe  2nd Floor: - PV Lab - Diagnostic Testing (echo, CT, nuclear med)  3rd Floor: - Vacant  4th Floor: - TCTS (cardiothoracic surgery) - AFib Clinic - Structural Heart Clinic - Vascular Surgery  - Vascular Ultrasound  5th Floor: - HeartCare Cardiology (general and EP) - Clinical Pharmacy for coumadin, hypertension, lipid, weight-loss medications, and med management  appointments    Valet parking services will be available as well.

## 2023-06-16 DIAGNOSIS — C50911 Malignant neoplasm of unspecified site of right female breast: Secondary | ICD-10-CM | POA: Diagnosis not present

## 2023-07-07 NOTE — Progress Notes (Unsigned)
 [facility]: Clinic Note  Subjective:  History of Present Illness: [name] is a 73 y.o. year old female who presents for 2 month follow up.  Sinus tachycardia - I'm not sure if this was in the setting of dehydration or overtreatment with synthroid , but it has improved with metoprolol  tartrate 25mg  BID - Continue metoprolol  tartrate 25mg  BID. I have sent a refill to her pharmacy    Hypothyroidism - In the hospital, the doctors attempted to cut the levothyroxine  75mcg daily to 37.5mcg daily, but patient's son is having a hard time cutting these tiny pills in half - decrease to 50mcg daily, which I have mailed in to her pharmacy  - repeat TSH in 8 weeks   Type 2 diabetes mellitus with diabetic polyneuropathy, with long-term current use of insulin  (HCC) - [name] wore the CGM for 14 days. The average reading was 219, % time in target was 31, % time below target was 0, and % time above target was. 69. Intervention will be to continue working on diet and lifestyle changes. The patient will be scheduled to see me (Dr Jarvis Mesa) for a final appointment.   -Continue Metformin  1g BID, Trulicity  0.75mg  weekly, Jardiance  25mg  daily, Actos  15mg  daily  - I considered starting insulin , but patient and son are hesitant to do this. I also considered increasing trulicity , but since she has a history of weight loss and is a normal BMI, I don't want to curb her appetite - continue current mgmt    BREAST CANCER, HX OF - Plan for MMG in 07/2023, which I ordered today. After that, could consider stopping cancer screening - will need to have a risk/benefit discussion   Weight loss - Weight is now stable, at 129 - continue glucerna shakes - do not increase GLP1   Essential hypertension - chronic and stable - We had stopped Azor  pill last time and this was appropriate - Continue Olmesartan  20mg  daily and Metoprolol  25mg  BID (more for HR than for BP)   Cognitive impairment -  chronic & stable - she has strong family support with her son and a home aide - Last MOCA was 14 in 2023         Please refer to Assessment and Plan below for full details in Problem-Based Charting.   Past Medical History:  Patient Active Problem List   Diagnosis Date Noted   Sinus tachycardia 04/20/2023   Type 2 diabetes mellitus with other specified complication (HCC) 04/20/2023   Healthcare maintenance 04/10/2022   Urinary incontinence, mixed 06/16/2020   Weight loss 08/17/2019   Cognitive impairment 01/13/2019   Insomnia 06/11/2018   Osteoarthritis of facet joint of lumbar spine 02/04/2017   Sciatica of left side without back pain 01/30/2017   Carpal tunnel syndrome of right wrist 12/09/2016   Primary osteoarthritis of first carpometacarpal joint of right hand 07/06/2015   Essential hypertension 08/13/2012   Glaucoma associated with systemic syndromes 08/07/2012   Hypothyroidism 04/16/2006   Anemia 04/16/2006   Depression 04/16/2006   BREAST CANCER, HX OF 04/16/2006   Hyperlipidemia 10/13/2003   Type 2 diabetes mellitus with diabetic polyneuropathy, with long-term current use of insulin  (HCC) 04/16/1991      Medications:  Current Outpatient Medications:    busPIRone  (BUSPAR ) 5 MG tablet, TAKE 1 TABLET(5 MG) BY MOUTH TWICE DAILY, Disp: 180 tablet, Rfl: 3   cyanocobalamin (VITAMIN B12) 500 MCG tablet, Take 500 mcg by mouth daily., Disp: , Rfl:    Dulaglutide  (  TRULICITY ) 0.75 MG/0.5ML SOAJ, INJECT THE CONTENTS OF ONE PEN  SUBCUTANEOUSLY WEEKLY AS  DIRECTED, Disp: 4 mL, Rfl: 5   empagliflozin  (JARDIANCE ) 25 MG TABS tablet, Take 1 tablet (25 mg total) by mouth daily., Disp: 90 tablet, Rfl: 3   glucose 4 GM chewable tablet, Chew 4 tablets (16 g total) by mouth as needed for low blood sugar., Disp: 50 tablet, Rfl: 12   latanoprost (XALATAN) 0.005 % ophthalmic solution, 1 drop at bedtime., Disp: , Rfl:    levothyroxine  (SYNTHROID ) 50 MCG tablet, Take 1 tablet (50 mcg total) by  mouth daily., Disp: 90 tablet, Rfl: 3   metFORMIN  (GLUCOPHAGE ) 1000 MG tablet, Take 1 tablet (1,000 mg total) by mouth 2 (two) times daily with a meal., Disp: 180 tablet, Rfl: 3   metoprolol  tartrate (LOPRESSOR ) 25 MG tablet, Take 1 tablet (25 mg total) by mouth 2 (two) times daily., Disp: 180 tablet, Rfl: 3   Nutritional Supplements (GLUCERNA SNACK SHAKE) LIQD, Take 1 Dose by mouth 3 (three) times daily as needed., Disp: 30 Bottle, Rfl: 3   olmesartan  (BENICAR ) 20 MG tablet, Take 1 tablet (20 mg total) by mouth daily., Disp: 90 tablet, Rfl: 3   pioglitazone  (ACTOS ) 15 MG tablet, Take 1 tablet (15 mg total) by mouth daily., Disp: 90 tablet, Rfl: 3   rosuvastatin  (CRESTOR ) 20 MG tablet, Take 1 tablet (20 mg total) by mouth daily., Disp: 90 tablet, Rfl: 3   venlafaxine  XR (EFFEXOR -XR) 150 MG 24 hr capsule, Take 1 capsule (150 mg total) by mouth daily with breakfast., Disp: 90 capsule, Rfl: 3   Allergies: Allergies  Allergen Reactions   Gabapentin  Other (See Comments)    Dizziness from 300 mg, tolerates 100 mg Dizziness from 300 mg, tolerates 100 mg    Meclizine  Hcl Rash   Metformin  Hcl Er Diarrhea    Patient did not tolerate Metformin  ER but is able to take lower dose of Metformin    Penicillins Rash    Has patient had a PCN reaction causing immediate rash, facial/tongue/throat swelling, SOB or lightheadedness with hypotension: Yes Has patient had a PCN reaction causing severe rash involving mucus membranes or skin necrosis: No Has patient had a PCN reaction that required hospitalization: pt was in hospital at time of reaction Has patient had a PCN reaction occurring within the last 10 years: No If all of the above answers are "NO", then may proceed with Cephalosporin use.   Sulfa Antibiotics Rash       Objective:   Vitals: There were no vitals filed for this visit.   Physical Exam: Physical Exam   Data: Labs, imaging, and micro were reviewed in Epic. Refer to Assessment and Plan  below for full details in Problem-Based Charting.  Assessment & Plan:  No problem-specific Assessment & Plan notes found for this encounter.     Patient will follow up in ***  Driscilla George, MD

## 2023-07-09 ENCOUNTER — Ambulatory Visit: Admitting: Dietician

## 2023-07-09 ENCOUNTER — Encounter: Payer: Self-pay | Admitting: Internal Medicine

## 2023-07-09 ENCOUNTER — Other Ambulatory Visit: Payer: Self-pay | Admitting: Dietician

## 2023-07-09 ENCOUNTER — Ambulatory Visit: Admitting: Internal Medicine

## 2023-07-09 VITALS — BP 111/70 | HR 109 | Temp 98.2°F | Ht 63.0 in | Wt 127.8 lb

## 2023-07-09 DIAGNOSIS — F325 Major depressive disorder, single episode, in full remission: Secondary | ICD-10-CM

## 2023-07-09 DIAGNOSIS — E1142 Type 2 diabetes mellitus with diabetic polyneuropathy: Secondary | ICD-10-CM

## 2023-07-09 DIAGNOSIS — E1169 Type 2 diabetes mellitus with other specified complication: Secondary | ICD-10-CM | POA: Diagnosis not present

## 2023-07-09 DIAGNOSIS — F32A Depression, unspecified: Secondary | ICD-10-CM

## 2023-07-09 DIAGNOSIS — Z794 Long term (current) use of insulin: Secondary | ICD-10-CM

## 2023-07-09 DIAGNOSIS — Z7984 Long term (current) use of oral hypoglycemic drugs: Secondary | ICD-10-CM | POA: Diagnosis not present

## 2023-07-09 DIAGNOSIS — I10 Essential (primary) hypertension: Secondary | ICD-10-CM

## 2023-07-09 DIAGNOSIS — E039 Hypothyroidism, unspecified: Secondary | ICD-10-CM | POA: Diagnosis not present

## 2023-07-09 DIAGNOSIS — Z853 Personal history of malignant neoplasm of breast: Secondary | ICD-10-CM | POA: Diagnosis not present

## 2023-07-09 DIAGNOSIS — C50911 Malignant neoplasm of unspecified site of right female breast: Secondary | ICD-10-CM | POA: Diagnosis not present

## 2023-07-09 DIAGNOSIS — E785 Hyperlipidemia, unspecified: Secondary | ICD-10-CM

## 2023-07-09 DIAGNOSIS — R000 Tachycardia, unspecified: Secondary | ICD-10-CM

## 2023-07-09 DIAGNOSIS — R4189 Other symptoms and signs involving cognitive functions and awareness: Secondary | ICD-10-CM

## 2023-07-09 DIAGNOSIS — R634 Abnormal weight loss: Secondary | ICD-10-CM

## 2023-07-09 LAB — POCT GLYCOSYLATED HEMOGLOBIN (HGB A1C): Hemoglobin A1C: 8.9 % — AB (ref 4.0–5.6)

## 2023-07-09 LAB — GLUCOSE, CAPILLARY: Glucose-Capillary: 171 mg/dL — ABNORMAL HIGH (ref 70–99)

## 2023-07-09 MED ORDER — PIOGLITAZONE HCL 30 MG PO TABS: 30.0000 mg | ORAL_TABLET | Freq: Every day | ORAL | 3 refills | Status: DC

## 2023-07-09 NOTE — Progress Notes (Signed)
 BP Readings from Last 3 Encounters:  07/09/23 111/70  05/22/23 130/64  04/30/23 116/64   Estimated body mass index is 22.64 kg/m as calculated from the following:   Height as of an earlier encounter on 07/09/23: 5' 3 (1.6 m).   Weight as of an earlier encounter on 07/09/23: 127 lb 12.8 oz (58 kg). Wt Readings from Last 20 Encounters:  07/09/23 127 lb 12.8 oz (58 kg)  05/22/23 128 lb 3.2 oz (58.2 kg)  04/30/23 129 lb 11.2 oz (58.8 kg)  04/20/23 110 lb (49.9 kg)  02/12/23 130 lb (59 kg)  10/30/22 125 lb 6.4 oz (56.9 kg)  10/30/22 125 lb 6.4 oz (56.9 kg)  07/10/22 121 lb 4.8 oz (55 kg)  04/10/22 127 lb 6.4 oz (57.8 kg)  01/15/22 124 lb 3.2 oz (56.3 kg)  10/30/21 129 lb 11.2 oz (58.8 kg)  10/02/21 134 lb 9.6 oz (61.1 kg)  08/28/21 133 lb 8 oz (60.6 kg)  08/02/21 137 lb 1.6 oz (62.2 kg)  07/21/21 150 lb (68 kg)  07/19/21 135 lb (61.2 kg)  07/19/21 135 lb 12.8 oz (61.6 kg)  07/05/21 131 lb 3.2 oz (59.5 kg)  06/28/21 130 lb (59 kg)  06/21/21 128 lb 3.2 oz (58.2 kg)   Lab Results  Component Value Date   HGBA1C 8.9 (A) 07/09/2023   HGBA1C 8.6 (H) 04/20/2023   HGBA1C 8.6 (A) 02/12/2023   HGBA1C 8.4 (A) 10/30/2022   HGBA1C 7.3 (A) 07/10/2022   Diabetes Self-Management Education  Visit Type: Annual Follow-Up  Appt. Start Time: 1100 Appt. End Time: 1130  07/09/2023  [name], identified by name and date of birth, is a 73 y.o. female with a diagnosis of Diabetes   Type 2  ASSESSMENT  Met with son and patient per Dr. Zacarias Hermann request. They are concerned about her night eating and hyperglycemia. [name] weight is stable right now and at the lowest desirable BMI for her age. Her daytime intake is light. She has been taken off all insulin  and currently is having hyperglycemia per her CGM and A1c. Low threshold for restarting low dose basal insulin  like 5 units Tresiba  daily if increase in pioglitazone  today is not helpful or she has side effects.    Diabetes Self-Management  Education - 07/09/23 1100       Visit Information   Visit Type Annual Follow-Up      Health Coping   How would you rate your overall health? Good      Psychosocial Assessment   Patient Belief/Attitude about Diabetes Motivated to manage diabetes   her son is with her, she is here with him and does not partipciate much in the conversation assuiming this is due to her dementia   What is the hardest part about your diabetes right now, causing you the most concern, or is the most worrisome to you about your diabetes?   Getting support / problem solving    Self-care barriers Low literacy;Lack of material resources;Debilitated state due to current medical condition    Self-management support Family    Other persons present Family Member   she resides with daughter and son that is not here   Patient Concerns Glycemic Control    Special Needs None    Preferred Learning Style No preference indicated    Learning Readiness Ready   son is ready   How often do you need to have someone help you when you read instructions, pamphlets, or other written materials from your  doctor or pharmacy? 4 - Often    What is the last grade level you completed in school? 12      Pre-Education Assessment   Patient understands the diabetes disease and treatment process. Comprehends key points    Patient understands incorporating nutritional management into lifestyle. Needs Review   referred by Dr. Jarvis Mesa for ideas on eating to help lower blood sugar and night eating on sweet foods   Patient undertands incorporating physical activity into lifestyle. Comprehends key points    Patient understands using medications safely. Comprehends key points    Patient understands monitoring blood glucose, interpreting and using results Comprehends key points    Patient understands prevention, detection, and treatment of acute complications. Comprehends key points    Patient understands prevention, detection, and treatment of chronic  complications. Compreheands key points    Patient understands how to develop strategies to address psychosocial issues. Comprehends key points    Patient understands how to develop strategies to promote health/change behavior. Comprehends key points      Complications   Last HgB A1C per patient/outside source 8.9 %    How often do you check your blood sugar? > 4 times/day    Fasting Blood glucose range (mg/dL) 161-096;045-409    Postprandial Blood glucose range (mg/dL) >811;914-782    Number of hypoglycemic episodes per month 0    Number of hyperglycemic episodes ( >200mg /dL): Daily    Can you tell when your blood sugar is high? No    Have you had a dilated eye exam in the past 12 months? Yes    Have you had a dental exam in the past 12 months? Yes    Are you checking your feet? Yes    How many days per week are you checking your feet? 7      Dietary Intake   Breakfast 830-9am two packs oatmeal -one plain and one brown sugar, splenda in cup of coffee    Snack (morning) activai yogurt or glucerna    Lunch 12-1 PM peanut butter sandwich on whole wheat sometimes w/ jelly   drinks water with this   Snack (afternoon) sometimes Glucerna or yoigrt or if blood suga ris high then water    Dinner 730-830 PM vegetables blend, beans, golden coral meatloaf, sweet potato casserole and vegetables, unsweet tea w splenda    Snack (evening) goes to be 1130-12 m gets up 2-3 night a week and eats at 2-3 AM, candy, cookies, cakes, yogurt    Beverage(s) coffee, water, unsweet tea      Activity / Exercise   Activity / Exercise Type ADL's;Light (walking / raking leaves)    How many days per week do you exercise? 7    How many minutes per day do you exercise? 30    Total minutes per week of exercise 210      Patient Education   Previous Diabetes Education Yes (please comment)   here   Healthy Eating Role of diet in the treatment of diabetes and the relationship between the three main macronutrients and  blood glucose level;Other (comment)   suggested increasing fat and portein during day and keeping low carb foods at hand for her to eat as desired.     Individualized Goals (developed by patient)   Nutrition Follow meal plan discussed      Post-Education Assessment   Patient understands incorporating nutritional management into lifestyle. Comprehends key points      Outcomes   Expected Outcomes Demonstrated interest  in learning but significant barriers to change   son states he is unsure his siblings will makes changes   Future DMSE 3-4 months    Program Status Not Completed      Subsequent Visit   Since your last visit have you continued or begun to take your medications as prescribed? Yes    Since your last visit have you had your blood pressure checked? Yes    Is your most recent blood pressure lower, unchanged, or higher since your last visit? Unchanged    Since your last visit have you experienced any weight changes? No change    Since your last visit, are you checking your blood glucose at least once a day? Yes             Individualized Plan for Diabetes Self-Management Training:   Learning Objective:  Patient will have a greater understanding of diabetes self-management. Patient education plan is to attend individual and/or group sessions per assessed needs and concerns.   Plan:   Patient Instructions  Today we discussed adding an egg or any protein/fat foods to boost her intake   Can consider adding glass of unsweet soy milk to lunch with sandwich again to boost day time intake.  Keep foods your mom can eat around and in sight with easy access   Cheese sticks or cut up cheese squares  [place] low sugar yogurt like Two Good [place] of marinated tomatoes Cut up strawberries, can mix with blueberries partially squashed, sprinkle with splenda and a teaspoon  of regular sugar Humus with 2-3 saltines or a rice cake  Guacamole  Bean soup- easy to make from canned pinto or  black beans and canned tomatoes with green chiles in a blender  Chicken salad  Egg salad  Tuna salad  Deviled eggs Grilled cheese  I am wondering if she gets up and eats when she is hungry and if she eats more during the day, she may do this less often. When she does, be sure there are foods she can eat in the front of the frig that she can easily see and get.  [name] [phone] (new number)   Expected Outcomes:  Demonstrated interest in learning but significant barriers to change (son states he is unsure his siblings will makes changes)  Education material provided: Snack sheet and Diabetes Resources  If problems or questions, patient to contact team via:  Phone  Future DSME appointment: 3-4 months Marlene Simas, RD 07/09/2023 12:08 PM.

## 2023-07-09 NOTE — Assessment & Plan Note (Signed)
-   Chronic and stable - Repeat lipid panel today - if stable, can discuss stopping statin for primary prevention

## 2023-07-09 NOTE — Patient Instructions (Addendum)
 Today we discussed adding an egg or any protein/fat foods to boost her intake   Can consider adding glass of unsweet soy milk to lunch with sandwich again to boost day time intake.  Keep foods your mom can eat around and in sight with easy access   Cheese sticks or cut up cheese squares  [place] low sugar yogurt like Two Good [place] of marinated tomatoes Cut up strawberries, can mix with blueberries partially squashed, sprinkle with splenda and a teaspoon  of regular sugar Humus with 2-3 saltines or a rice cake  Guacamole  Bean soup- easy to make from canned pinto or black beans and canned tomatoes with green chiles in a blender  Chicken salad  Egg salad  Tuna salad  Deviled eggs Grilled cheese  Sugar free gelatin Sugar free pudding  I am wondering if she gets up and eats when she is hungry and if she eats more during the day, she may do this less often. When she does, be sure there are foods she can eat in the front of the frig that she can easily see and get.  [name] [phone] (new number)

## 2023-07-09 NOTE — Assessment & Plan Note (Signed)
 [name] wore the CGM for 14 days. The average reading was 197, % time in target was 41, % time below target was 0, and % time above target was 59. Intervention will be to continue working on diet and lifestyle changes, she met with [name] today. Also to increase Actos . The patient will be scheduled to see me (Dr Jarvis Mesa) for a final appointment.   - A1C 8.9 today - Increase Actos  to 30mg  daily - Visit with [name] today to discuss nutrition - Continue Metformin  1g BID, Trulicity  0.75mg  weekly, Jardiance  25mg  daily,

## 2023-07-09 NOTE — Assessment & Plan Note (Signed)
-   We discussed risk/benefits of screening today and she opted to continue MMG - Next scheduled for 07/2023

## 2023-07-09 NOTE — Progress Notes (Signed)
 Referral request

## 2023-07-09 NOTE — Assessment & Plan Note (Signed)
-   chronic & stable - she has strong family support with her son and a home aide - Last MOCA was 14 in 2023

## 2023-07-09 NOTE — Assessment & Plan Note (Signed)
-   chronic and stable - continue Olmesartan  20mg  daily & Metoprolol  25mg  BID (more for HR than BP)

## 2023-07-09 NOTE — Assessment & Plan Note (Signed)
-   Chronic and stable - We decreased Synthroid  to 50mcg daily last time - Repeat TSH today, adjust dose as needed

## 2023-07-09 NOTE — Patient Instructions (Signed)
 Thank you, Ms.Travis Friedman for allowing us  to provide your care today. Today we discussed your diabetes.    I have ordered the following labs for you:   Lab Orders         TSH         Glucose, capillary         Lipid Profile         Vitamin B12         POC Hbg A1C       Referrals ordered today:   Referral Orders  No referral(s) requested today     I have ordered the following medication/changed the following medications:   Stop the following medications: Medications Discontinued During This Encounter  Medication Reason   pioglitazone  (ACTOS ) 15 MG tablet Reorder     Start the following medications: Meds ordered this encounter  Medications   pioglitazone  (ACTOS ) 30 MG tablet    Sig: Take 1 tablet (30 mg total) by mouth daily.    Dispense:  90 tablet    Refill:  3     Return in about 3 months (around 10/09/2023) for Diabetes.    Remember:  - I am increasing your actos  from 15mg  daily to 30mg  daily. I have sent the new pill to your mail pharmacy - Please work on not eating sugary snacks, especially at night.  - I am checking labs today and will call you to discuss if we would be able to stop some of your medicines  Should you have any questions or concerns please call the internal medicine clinic at [phone].     Ruven Corradi, MD Faculty, Internal Medicine Teaching Progam [facility]

## 2023-07-09 NOTE — Assessment & Plan Note (Signed)
-   Chronic and stable, she is established with Cardiology - Continue Metoprolol  tartrate 25mg  BID - TSH today

## 2023-07-09 NOTE — Assessment & Plan Note (Signed)
-   Weight is stable at 128 pounds - Continue glucerna shakes - Will not increase GLP1. If A1C improves, may even stop this medicine

## 2023-07-09 NOTE — Assessment & Plan Note (Signed)
-   chronic & stable - mood is good today - Continue Venlafaxine 150mg  daily & Buspar 5mg  BID

## 2023-07-10 LAB — LIPID PANEL
Chol/HDL Ratio: 2.2 ratio (ref 0.0–4.4)
Cholesterol, Total: 94 mg/dL — ABNORMAL LOW (ref 100–199)
HDL: 43 mg/dL (ref >39)
LDL Chol Calc (NIH): 36 mg/dL (ref 0–99)
Triglycerides: 68 mg/dL (ref 0–149)
VLDL Cholesterol Cal: 15 mg/dL (ref 5–40)

## 2023-07-10 LAB — VITAMIN B12: Vitamin B-12: 1239 pg/mL (ref 232–1245)

## 2023-07-10 LAB — TSH: TSH: 2.93 u[IU]/mL (ref 0.450–4.500)

## 2023-07-14 ENCOUNTER — Ambulatory Visit: Payer: Self-pay | Admitting: Internal Medicine

## 2023-07-14 NOTE — Progress Notes (Signed)
 Spoke to patient's son [name] on the phone re her labs.  B12 is 1239, well above lower limit of normal - we are going to stop B12 supplementation.  LDL is 36 - well below goal of <100 for primary prevention - stop crestor  And TSH is normal  So, she will stop B12 & Crestor . Would like to start an OTC multivitamin, which I agree with

## 2023-07-18 ENCOUNTER — Telehealth: Payer: Self-pay | Admitting: Pharmacist

## 2023-07-18 NOTE — Progress Notes (Signed)
   07/18/2023  Patient ID: [name], female   DOB: 1951/01/15, 73 y.o.   MRN: [id number]  Received referral for medication review in regards to minimizing the number of pills.  There are only 2 options to minimize at this time:  Synjardy XR 12.5-1000mg  tablets: 1 tablet twice a day  - This gets rid of the Jardiance  alone  - Should be $4.80 monthly for 60 tablets  Switching metoprolol  tartrate to succinate as Toprol  XL 50mg : 1 tablet daily   Unfortunately, none of the other medications can be simplified at this time or would be covered by insurance.   Delvin File, PharmD [facility]  Phone Number: [phone]

## 2023-07-23 ENCOUNTER — Telehealth: Payer: Self-pay | Admitting: Internal Medicine

## 2023-07-23 MED ORDER — METOPROLOL SUCCINATE ER 50 MG PO TB24: 50.0000 mg | ORAL_TABLET | Freq: Every day | ORAL | 3 refills | Status: DC

## 2023-07-23 MED ORDER — SYNJARDY 12.5-1000 MG PO TABS: 1.0000 | ORAL_TABLET | Freq: Two times a day (BID) | ORAL | 3 refills | Status: DC

## 2023-07-23 NOTE — Telephone Encounter (Signed)
 Called patient and spoke to her son [name]. To consolidate medicines we will: - stop metformin  - stop jardiance  - start synjardy 12.5-1000mg  BID - stop metoprolol  tartrate bid - start metoprolol  succinate 50mg  daily   This cuts down on pill burden

## 2023-08-04 ENCOUNTER — Ambulatory Visit
Admission: RE | Admit: 2023-08-04 | Discharge: 2023-08-04 | Disposition: A | Discharge status: Home / Self Care | Source: Ambulatory Visit | Attending: Internal Medicine | Admitting: Internal Medicine

## 2023-08-04 DIAGNOSIS — Z1231 Encounter for screening mammogram for malignant neoplasm of breast: Secondary | ICD-10-CM

## 2023-08-12 ENCOUNTER — Encounter: Payer: Self-pay | Admitting: *Deleted

## 2023-09-22 ENCOUNTER — Ambulatory Visit (INDEPENDENT_AMBULATORY_CARE_PROVIDER_SITE_OTHER): Admitting: Podiatry

## 2023-09-22 ENCOUNTER — Telehealth: Payer: Self-pay | Admitting: *Deleted

## 2023-09-22 ENCOUNTER — Encounter: Payer: Self-pay | Admitting: Podiatry

## 2023-09-22 DIAGNOSIS — E1142 Type 2 diabetes mellitus with diabetic polyneuropathy: Secondary | ICD-10-CM | POA: Diagnosis not present

## 2023-09-22 DIAGNOSIS — M79674 Pain in right toe(s): Secondary | ICD-10-CM | POA: Diagnosis not present

## 2023-09-22 DIAGNOSIS — M79675 Pain in left toe(s): Secondary | ICD-10-CM

## 2023-09-22 DIAGNOSIS — B351 Tinea unguium: Secondary | ICD-10-CM

## 2023-09-22 NOTE — Telephone Encounter (Signed)
 Patient was identified as falling into the [place] Measure - Diabetes.   Patient was: Appointment already scheduled for:  10/13/23.

## 2023-09-22 NOTE — Progress Notes (Signed)
 This patient returns to my office for at risk foot care.  This patient requires this care by a professional since this patient will be at risk due to having diabetic neuropathy.This patient is unable to cut nails herself since the patient cannot reach her nails.These nails are painful walking and wearing shoes.  This patient presents for at risk foot care today.  General Appearance  Alert, conversant and in no acute stress.  Vascular  Dorsalis pedis and posterior tibial  pulses are palpable  bilaterally.  Capillary return is within normal limits  bilaterally. Temperature is within normal limits  bilaterally.  Neurologic  Senn-Weinstein monofilament wire test within normal limits  bilaterally. Muscle power within normal limits bilaterally.  Nails Thick disfigured discolored nails with subungual debris  from hallux to fifth toes bilaterally. No evidence of bacterial infection or drainage bilaterally.  Orthopedic  No limitations of motion  feet .  No crepitus or effusions noted.  No bony pathology or digital deformities noted.  Skin  normotropic skin with no porokeratosis noted bilaterally.  No signs of infections or ulcers noted.   HD 5th  B/L asymptomatic.  Onychomycosis  Pain in right toes  Pain in left toes  Consent was obtained for treatment procedures.   Mechanical debridement of nails 1-5  bilaterally performed with a nail nipper.  Filed with dremel without incident.    Return office visit   4  months                   Told patient to return for periodic foot care and evaluation due to potential at risk complications.   Ruffin Cotton DPM

## 2023-10-09 ENCOUNTER — Encounter: Admitting: Dietician

## 2023-10-10 ENCOUNTER — Encounter: Admitting: Student

## 2023-10-13 ENCOUNTER — Ambulatory Visit: Admitting: Student

## 2023-10-13 ENCOUNTER — Other Ambulatory Visit (HOSPITAL_COMMUNITY): Payer: Self-pay

## 2023-10-13 ENCOUNTER — Other Ambulatory Visit: Payer: Self-pay

## 2023-10-13 VITALS — BP 142/86 | HR 90 | Temp 98.7°F | Ht 63.0 in | Wt 121.0 lb

## 2023-10-13 DIAGNOSIS — E1142 Type 2 diabetes mellitus with diabetic polyneuropathy: Secondary | ICD-10-CM

## 2023-10-13 DIAGNOSIS — Z79899 Other long term (current) drug therapy: Secondary | ICD-10-CM | POA: Diagnosis not present

## 2023-10-13 DIAGNOSIS — I10 Essential (primary) hypertension: Secondary | ICD-10-CM | POA: Diagnosis not present

## 2023-10-13 DIAGNOSIS — Z794 Long term (current) use of insulin: Secondary | ICD-10-CM | POA: Diagnosis not present

## 2023-10-13 DIAGNOSIS — R4189 Other symptoms and signs involving cognitive functions and awareness: Secondary | ICD-10-CM

## 2023-10-13 DIAGNOSIS — E039 Hypothyroidism, unspecified: Secondary | ICD-10-CM

## 2023-10-13 DIAGNOSIS — F32A Depression, unspecified: Secondary | ICD-10-CM | POA: Diagnosis not present

## 2023-10-13 LAB — POCT GLYCOSYLATED HEMOGLOBIN (HGB A1C): HbA1c, POC (controlled diabetic range): 7.9 % — AB (ref 0.0–7.0)

## 2023-10-13 LAB — GLUCOSE, CAPILLARY: Glucose-Capillary: 137 mg/dL — ABNORMAL HIGH (ref 70–99)

## 2023-10-13 MED ORDER — OLMESARTAN MEDOXOMIL 20 MG PO TABS
20.0000 mg | ORAL_TABLET | Freq: Every day | ORAL | 3 refills | Status: DC
  Filled 2023-10-13: qty 90, 90d supply, fill #0
  Filled 2023-10-15: qty 30, 30d supply, fill #0
  Filled 2023-10-27 – 2023-11-03 (×2): qty 90, 90d supply, fill #0
  Filled 2023-11-04 – 2023-11-05 (×2): qty 30, 30d supply, fill #0

## 2023-10-13 MED ORDER — LEVOTHYROXINE SODIUM 50 MCG PO TABS
50.0000 ug | ORAL_TABLET | Freq: Every day | ORAL | 3 refills | Status: DC
  Filled 2023-10-13: qty 90, 90d supply, fill #0
  Filled 2023-10-15: qty 30, 30d supply, fill #0
  Filled 2023-10-27 – 2023-11-03 (×2): qty 90, 90d supply, fill #0
  Filled 2023-11-04 – 2023-11-05 (×2): qty 30, 30d supply, fill #0

## 2023-10-13 MED ORDER — METOPROLOL SUCCINATE ER 50 MG PO TB24
50.0000 mg | ORAL_TABLET | Freq: Every day | ORAL | 3 refills | Status: DC
  Filled 2023-10-13: qty 90, 90d supply, fill #0
  Filled 2023-10-15: qty 30, 30d supply, fill #0
  Filled 2023-10-27: qty 90, 90d supply, fill #0
  Filled 2023-10-27: qty 10, 10d supply, fill #0
  Filled 2023-11-03: qty 10, 10d supply, fill #1
  Filled 2023-11-04 – 2023-11-05 (×2): qty 30, 30d supply, fill #1

## 2023-10-13 MED ORDER — SYNJARDY 12.5-1000 MG PO TABS
1.0000 | ORAL_TABLET | Freq: Two times a day (BID) | ORAL | 3 refills | Status: DC
  Filled 2023-10-13: qty 180, 90d supply, fill #0
  Filled 2023-10-15: qty 60, 30d supply, fill #0
  Filled 2023-10-27: qty 20, 10d supply, fill #0
  Filled 2023-10-27: qty 180, 90d supply, fill #0
  Filled 2023-11-03: qty 20, 10d supply, fill #1
  Filled 2023-11-04 – 2023-11-05 (×2): qty 60, 30d supply, fill #1

## 2023-10-13 MED ORDER — PIOGLITAZONE HCL 30 MG PO TABS
30.0000 mg | ORAL_TABLET | Freq: Every day | ORAL | 3 refills | Status: DC
  Filled 2023-10-13 – 2023-10-15 (×2): qty 90, 90d supply, fill #0

## 2023-10-13 MED ORDER — BUSPIRONE HCL 5 MG PO TABS
5.0000 mg | ORAL_TABLET | Freq: Two times a day (BID) | ORAL | 3 refills | Status: DC
  Filled 2023-10-13: qty 180, 90d supply, fill #0
  Filled 2023-10-15: qty 60, 30d supply, fill #0
  Filled 2023-10-27 – 2023-11-03 (×2): qty 180, 90d supply, fill #0
  Filled 2023-11-04 – 2023-11-05 (×2): qty 60, 30d supply, fill #0

## 2023-10-13 MED ORDER — VENLAFAXINE HCL ER 150 MG PO CP24
150.0000 mg | ORAL_CAPSULE | Freq: Every day | ORAL | 3 refills | Status: DC
  Filled 2023-10-13 – 2023-10-15 (×2): qty 90, 90d supply, fill #0

## 2023-10-13 NOTE — Progress Notes (Unsigned)
 Subjective:  CC: follow up  HPI:  [name] is a 73 y.o. female with a past medical history stated below and presents today for diabetes follow up. Patient is present with Son, [name], who manages medications and assists mother with iADLs as patient has history of dementia. Please see problem based assessment and plan for additional details.  Past Medical History:  Diagnosis Date   Advanced care planning/counseling discussion 05/29/2021   Arthritis    Bilateral hip pain 02/24/2015   Borderline glaucoma of both eyes    Depression    Diabetic polyneuropathy (HCC)    Diverticulosis of colon    Dizziness 12/05/2017   Dry eyes, bilateral    Fall 08/02/2021   Feeling of incomplete bladder emptying    Frequent falls 01/08/2018   Gait abnormality 07/19/2021   History of breast cancer oncologist-  dr vivien-- per lov note no recurrence   dx 04/ 1999 --- Stage 3B-- s/p  chemotherapy then right mastectomy then concurrent chemoradiation therapy   History of urinary retention    01/ 2018   Hydronephrosis 04/01/2017   Hydronephrosis of right kidney    Hyperlipidemia    Hypertension    Hypokalemia 12/09/2016   Hypomagnesemia 12/09/2016   Hypothyroidism    Insulin  dependent type 2 diabetes mellitus [facility])    endocrinologist-  dr trixie---  last A1c 8.7 on 02-20-2016   Left leg pain 03/15/2019   LOW BACK PAIN 04/16/2006   Qualifier: Diagnosis of  By: Elizabeth MD, Heron     Lymphedema of upper extremity    right   Macular degeneration, left eye    followed by dr alvia   Renal insufficiency    Seizure-like activity (HCC) 04/17/2019   Urgency of urination    Visual hallucinations 01/12/2019   Vitamin D  deficiency 08/17/2019   Weight loss 08/17/2019    Current Outpatient Medications on File Prior to Visit  Medication Sig Dispense Refill   Dulaglutide  (TRULICITY ) 0.75 MG/0.5ML SOAJ INJECT THE CONTENTS OF ONE PEN  SUBCUTANEOUSLY WEEKLY AS  DIRECTED 4 mL 5    glucose 4 GM chewable tablet Chew 4 tablets (16 g total) by mouth as needed for low blood sugar. 50 tablet 12   latanoprost (XALATAN) 0.005 % ophthalmic solution 1 drop at bedtime.     Nutritional Supplements (GLUCERNA SNACK SHAKE) LIQD Take 1 Dose by mouth 3 (three) times daily as needed. 30 Bottle 3   No current facility-administered medications on file prior to visit.    Family History  Problem Relation Age of Onset   Heart disease Father    Diabetes Father    Stroke Sister    Anuerysm Sister 26       brain; maternal half-sister   Heart disease Brother    Anuerysm Brother 34       aortic; maternal half-brother   Breast cancer Daughter 27       negative genetic testing in 2015   Heart attack Daughter        46-47   Diabetes Paternal Grandmother    Stroke Paternal Grandfather    Anuerysm Brother        NOS type; full brother   Fibroids Daughter        s/p hysterectomy at 40y   Brain cancer Maternal Uncle        dx. older than 12; NOS type   Brain cancer Cousin        maternal 1st cousin; d. early 43s; NOS type  Deafness Paternal Uncle        prelingual    Social History   Socioeconomic History   Marital status: Widowed    Spouse name: Not on file   Number of children: Not on file   Years of education: Not on file   Highest education level: Not on file  Occupational History   Not on file  Tobacco Use   Smoking status: Never   Smokeless tobacco: Never  Vaping Use   Vaping status: Never Used  Substance and Sexual Activity   Alcohol  use: No    Alcohol /week: 0.0 standard drinks of alcohol    Drug use: No   Sexual activity: Not on file  Other Topics Concern   Not on file  Social History Narrative   Not on file   Social Drivers of Health   Financial Resource Strain: Low Risk  (10/30/2022)   Overall Financial Resource Strain (CARDIA)    Difficulty of Paying Living Expenses: Not hard at all  Food Insecurity: Food Insecurity Present (04/20/2023)   Hunger Vital  Sign    Worried About Running Out of Food in the Last Year: Sometimes true    Ran Out of Food in the Last Year: Sometimes true  Transportation Needs: No Transportation Needs (04/20/2023)   PRAPARE - Administrator, Civil Service (Medical): No    Lack of Transportation (Non-Medical): No  Physical Activity: Inactive (10/30/2022)   Exercise Vital Sign    Days of Exercise per Week: 0 days    Minutes of Exercise per Session: 0 min  Stress: No Stress Concern Present (10/30/2022)   [employer] of Occupational Health - Occupational Stress Questionnaire    Feeling of Stress : Not at all  Social Connections: Socially Isolated (04/20/2023)   Social Connection and Isolation Panel    Frequency of Communication with Friends and Family: Once a week    Frequency of Social Gatherings with Friends and Family: Once a week    Attends Religious Services: More than 4 times per year    Active Member of [employer] or Organizations: No    Attends Banker Meetings: Never    Marital Status: Divorced  Catering manager Violence: Not At Risk (04/20/2023)   Humiliation, Afraid, Rape, and Kick questionnaire    Fear of Current or Ex-Partner: No    Emotionally Abused: No    Physically Abused: No    Sexually Abused: No    Review of Systems: ROS negative except for what is noted on the assessment and plan.  Objective:   Vitals:   10/13/23 1549 10/13/23 1644  BP: (!) 161/78 (!) 142/86  Pulse: 94 90  Temp: 98.7 F (37.1 C)   TempSrc: Oral   SpO2: 100%   Weight: 121 lb (54.9 kg)   Height: 5' 3 (1.6 m)     Physical Exam: Constitutional: well-appearing elderly woman sitting in chair, in no acute distress HENT: normocephalic atraumatic, mucous membranes moist Eyes: conjunctiva non-erythematous Neck: supple Cardiovascular: regular rate and rhythm, no m/r/g Pulmonary/Chest: normal work of breathing on room air, lungs clear to auscultation bilaterally Abdominal: soft, non-tender,  non-distended MSK: normal bulk and tone Neurological: alert & oriented x 3, able to recall important events but unable to tell me about her every day life for the most part. Able to respond to review of systems. normal gait Skin: warm and dry Psych: Pleasant mood and affect.        04/30/2023   11:02 AM  Depression screen  PHQ 2/9  Decreased Interest 0  Down, Depressed, Hopeless 0  PHQ - 2 Score 0        No data to display           Assessment & Plan:   Essential hypertension Previously well controlled <120/80. Today's readings are elevated. Not inclined to change current therapy (olmesartan , metop succinate) today. [name] will monitor BP at home and let us  know if persistently elevated or if patient is symptomatic. Will continue management as it.  Hypothyroidism Refilled medications.  Type 2 diabetes mellitus with diabetic polyneuropathy, with long-term current use of insulin  [facility]) Lab Results  Component Value Date   HGBA1C 7.9 (A) 10/13/2023   HGBA1C 8.9 (A) 07/09/2023   HGBA1C 8.6 (H) 04/20/2023   Better controlled than prior. At goal for age with increase in Actos  to 30 mg at last visit. She has also been on Metforming 1g BID, Jardiance  25 mg daily, and Trulicity  0.75. Discussed her continued weight loss; currently at 121 lb. If continues losing weight, we may need to discuss discontinuation.  - No changes otherwise   Cognitive impairment Persistent, progressive. More dependent on [name] and daughter for ADLs and iADLs. No overt agitation or aggression, but has swapped sleep cycles and wanders at night. It seems at times she is also delirious.   Discussed Melatonin 5 mg daily before bed time. Given caregiver stress and need for daytime interaction, we also reviewed [facility] as an option. In the meantime, I am also referring Ms. Frate and [name] to Dr. Lorain Baseman for help with medication management, Med Sync, and assessment of prepackaged meds. They were able to meet  here in office and agree with referral.   Depression Pleasant mood. No concerns other that the switch sleep cycle addressed above.    Return in about 3 months (around 01/12/2024) for with PCP for weight trend and GLP1 weaning, diabetes, and dementia monitoring.  Patient discussed with Dr. Lovie Hadassah Kristy Rosario, MD [facility] Internal Medicine Residency Program

## 2023-10-13 NOTE — Patient Instructions (Addendum)
 Thank you, Ms.Lindi Resides for allowing us  to provide your care today. Today we discussed:  .    Call them to inquire about their services and enrollment  Diabetes: no changes. BUT please monitor her weight. Today's weight was 121lb. If lower than this at the next visit, we may need to stop the Trulicity   Blood pressure: This may be hard, but could you please check your mom's blood pressure once weekly in the morning? We can trend what her blood pressures are at home and see if we need to protect her futher  Referrals: - to Dr. Brinda, the pharmacit to help you with the medicaitons - Eye doctor: s 12/15/2023 @ 1pm - you already have an appointment with them  I have ordered the following labs for you:   Lab Orders         Glucose, capillary         POC Hbg A1C      I will call if any are abnormal. All of your labs can be accessed through My Chart.   My Chart Access: [email or web address]  Please follow-up in: 3 month with your new primary care doctor    We look forward to seeing you next time. Please call our clinic at [phone] if you have any questions or concerns. The best time to call is Monday-Friday from 9am-4pm, but there is someone available 24/7. If after hours or the weekend, call the main hospital number and ask for the Internal Medicine Resident On-Call. If you need medication refills, please notify your pharmacy one week in advance and they will send us  a request.   Thank you for letting us  take part in your care. Wishing you the best!  Elnora Ip, MD 10/13/2023, 4:19 PM [facility]

## 2023-10-13 NOTE — Progress Notes (Unsigned)
 Consulted by MD to discuss medication management with patient and her son, [name], after physician visit. [name] reports that compliance packing would be very helpful for managing patient's medications and they would like to transition to [facility] pharmacy for mail order. Maintenance medications have been sent to [facility]. Called [facility] to deactivate profile. Will communicate with [facility] pharmacy to set up pill packs to go out in early October. Anticipate they will need to short-fill Synjardy  to sync her medications. Will communicate with CPhT, Dylan Burnette, who manages pill packs.   Patient is connected to clinic Clarity account. A1C today improved to 7.9%. Will continue to follow for medication adherence and diabetes education. Will continue to monitor weight loss with GLP-1RA, Trulicity .   Lorain Baseman, PharmD [facility] [phone]

## 2023-10-14 ENCOUNTER — Other Ambulatory Visit (HOSPITAL_COMMUNITY): Payer: Self-pay

## 2023-10-14 ENCOUNTER — Other Ambulatory Visit: Payer: Self-pay

## 2023-10-15 ENCOUNTER — Encounter: Payer: Self-pay | Admitting: Student

## 2023-10-15 ENCOUNTER — Other Ambulatory Visit: Payer: Self-pay

## 2023-10-15 NOTE — Assessment & Plan Note (Addendum)
 Persistent, progressive. More dependent on [name] and daughter for ADLs and iADLs. No overt agitation or aggression, but has swapped sleep cycles and wanders at night. It seems at times she is also delirious.   Discussed Melatonin 5 mg daily before bed time. Given caregiver stress and need for daytime interaction, we also reviewed [facility] as an option. In the meantime, I am also referring [name] and [name] to Dr. Lorain Baseman for help with medication management, Med Sync, and assessment of prepackaged meds. They were able to meet here in office and agree with referral.

## 2023-10-15 NOTE — Assessment & Plan Note (Addendum)
 Previously well controlled <120/80. Today's readings are elevated. Not inclined to change current therapy (olmesartan , metop succinate) today. [name] will monitor BP at home and let us  know if persistently elevated or if patient is symptomatic. Will continue management as it.

## 2023-10-15 NOTE — Assessment & Plan Note (Signed)
 Lab Results  Component Value Date   HGBA1C 7.9 (A) 10/13/2023   HGBA1C 8.9 (A) 07/09/2023   HGBA1C 8.6 (H) 04/20/2023   Better controlled than prior. At goal for age with increase in Actos  to 30 mg at last visit. She has also been on Metforming 1g BID, Jardiance  25 mg daily, and Trulicity  0.75. Discussed her continued weight loss; currently at 121 lb. If continues losing weight, we may need to discuss discontinuation.  - No changes otherwise

## 2023-10-15 NOTE — Assessment & Plan Note (Signed)
 Pleasant mood. No concerns other that the switch sleep cycle addressed above.

## 2023-10-15 NOTE — Assessment & Plan Note (Signed)
 Refilled medications

## 2023-10-16 ENCOUNTER — Other Ambulatory Visit: Payer: Self-pay

## 2023-10-20 NOTE — Progress Notes (Signed)
 Internal Medicine Clinic Attending  Case discussed with the resident at the time of the visit.  We reviewed the resident's history and exam and pertinent patient test results.  I agree with the assessment, diagnosis, and plan of care documented in the resident's note.

## 2023-10-27 ENCOUNTER — Other Ambulatory Visit: Payer: Self-pay

## 2023-10-27 ENCOUNTER — Other Ambulatory Visit (INDEPENDENT_AMBULATORY_CARE_PROVIDER_SITE_OTHER)

## 2023-10-27 ENCOUNTER — Other Ambulatory Visit (HOSPITAL_COMMUNITY): Payer: Self-pay

## 2023-10-27 DIAGNOSIS — E1142 Type 2 diabetes mellitus with diabetic polyneuropathy: Secondary | ICD-10-CM

## 2023-10-27 DIAGNOSIS — Z794 Long term (current) use of insulin: Secondary | ICD-10-CM

## 2023-10-27 NOTE — Progress Notes (Signed)
 10/27/2023 Name: [name] MRN: [id number] DOB: August 28, 1950  Chief Complaint  Patient presents with   Adherence Packaging   Diabetes    [name] is a 73 y.o. year old female who presented for a telephone visit. Spoke with patient's son [name], who manages medications.   They were referred to the pharmacist by their PCP for assistance in managing diabetes and medication adherence . PMH includes HTN, hypothyroidism, T2DM with polyneuropathy, HLD, cognitive impairment, depression.    Subjective: Patient was last seen by PCP, Hadassah Ala, MD, on 10/13/23. She was also consulted by pharmacy during this visit. She was accompanied by her son, [name], who manages her medications. BP was uncontrolled at this visit, but typically at goal, so no changes were made. A1C had improved from 8.9% to 7.9% on Trulicity  0.75 mg. Noted that patient has continued to lose weight, most recently she is 121 lbs. Discussed that if weight loss continues, may need to d/c GLP-1RA to preserve strength. For cognitive impairment, discussed trial of melatonin 5 mg at bedtime. We determined that she would be a good candidate for compliance packaging and her maintenance medications were sent to [facility] pharmacy.   Today, patient's son reports they are about to run out of Synjardy  and metoprolol  (one day remaining). Will request short fill to align with filling most meds in upcoming pill packs. Reports blood sugars are ok, reports she is tolerating Trulicity  well.    Care Team: Primary Care Provider: [name] Sick, MD ; Next Scheduled Visit: 12/10/22   Medication Access/Adherence  Current Pharmacy:  [id number] - [place], [place] - [address] AT [facility] [address] [place] [address] Phone: [phone] Fax: [phone]  [id number] [place] - [address] AT [facility] [address] [place] [address] Phone: [phone]  Fax: [phone]  [facility] [address] [place] [address] Phone: [phone] Fax: [phone]  [facility] - [facility] [address] [place] [address] Phone: [phone] Fax: [phone]   Patient reports affordability concerns with their medications: No  Patient reports access/transportation concerns to their pharmacy: Yes  - still interested in transitioning to pill packs Patient reports adherence concerns with their medications:  No  - Son [name] primarily helps with medications   Diabetes:  Current medications: Trulicity  0.75 mg weekly, Synjardy  (empagliflozin -metformin ) 12.05-998 mg BID, pioglitazone  30 mg daily Medications tried in the past: Ozempic  - significant weight loss  Current glucose readings:         Medication Management:  Current adherence strategy: Son organizes her medications in pill box to give to her, would prefer to transition to adherence packaging  Patient reports Good adherence to medications  Objective:  BP Readings from Last 3 Encounters:  10/13/23 (!) 142/86  07/09/23 111/70  05/22/23 130/64    Lab Results  Component Value Date   HGBA1C 7.9 (A) 10/13/2023   HGBA1C 8.9 (A) 07/09/2023   HGBA1C 8.6 (H) 04/20/2023       Latest Ref Rng & Units 04/20/2023    3:50 AM 01/15/2022   10:20 AM 07/21/2021    6:52 AM  BMP  Glucose 70 - 99 mg/dL 849  838  87   BUN 8 - 23 mg/dL 18  21  26    Creatinine 0.44 - 1.00 mg/dL 8.97  9.11  8.78   Sodium 135 - 145  mmol/L 137  138  140   Potassium 3.5 - 5.1 mmol/L 3.8  3.8  3.9   Chloride 98 - 111 mmol/L 104  104  106   CO2 22 - 32 mmol/L 22  25  25    Calcium  8.9 - 10.3 mg/dL 9.6  9.7  9.4     Lab Results  Component Value Date   CHOL 94 (L) 07/09/2023   HDL 43 07/09/2023   LDLCALC 36 07/09/2023   TRIG 68 07/09/2023   CHOLHDL 2.2 07/09/2023    Medications Reviewed Today     Reviewed by Brinda Lorain SQUIBB, RPH (Pharmacist) on  10/27/23 at 1738  Med List Status: <None>   Medication Order Taking? Sig Documenting Provider Last Dose Status Informant  busPIRone  (BUSPAR ) 5 MG tablet [id number]  Take 1 tablet (5 mg total) by mouth 2 (two) times daily. Elnora Ip, MD  Active   Dulaglutide  (TRULICITY ) 0.75 MG/0.5ML EMMANUEL [id number]  INJECT THE CONTENTS OF ONE PEN  SUBCUTANEOUSLY WEEKLY AS  DIRECTED Lovie Clarity, MD  Active Self, Pharmacy Records           Med Note LEOBARDO, NAT Repress Apr 20, 2023  6:28 AM) Injects on Tuesday  Empagliflozin -metFORMIN  HCl (SYNJARDY ) 12.05-998 MG TABS [id number]  Take 1 tablet by mouth 2 (two) times daily. Gomez-Caraballo, Maria, MD  Active   glucose 4 GM chewable tablet [id number]  Chew 4 tablets (16 g total) by mouth as needed for low blood sugar. Veronia Maria, DO  Active Self, Pharmacy Records           Med Note MYLO POWELL CROME   Sat Apr 17, 2019  8:29 PM) No record of this in past 6 months per external pharmacy records  latanoprost (XALATAN) 0.005 % ophthalmic solution [id number]  1 drop at bedtime. [provider]  Active Self, Pharmacy Records  levothyroxine  (SYNTHROID ) 50 MCG tablet [id number]  Take 1 tablet (50 mcg total) by mouth daily. Gomez-Caraballo, Maria, MD  Active   metoprolol  succinate (TOPROL -XL) 50 MG 24 hr tablet [id number]  Take 1 tablet (50 mg total) by mouth daily. Gomez-Caraballo, Maria, MD  Active   Nutritional Supplements [facility] SNACK SHAKE) BERNICE [id number]  Take 1 Dose by mouth 3 (three) times daily as needed. Narendra, Nischal, MD  Active Self, Pharmacy Records           Med Note LEOBARDO, NICOLE   Sun Apr 20, 2023  6:07 AM)    olmesartan  (BENICAR ) 20 MG tablet [id number]  Take 1 tablet (20 mg total) by mouth daily. Gomez-Caraballo, Maria, MD  Active   pioglitazone  (ACTOS ) 30 MG tablet [id number]  Take 1 tablet (30 mg total) by mouth daily. Gomez-Caraballo, Maria, MD  Active   venlafaxine  XR (EFFEXOR -XR) 150 MG 24 hr capsule [id number]  Take 1 capsule (150  mg total) by mouth daily with breakfast. Elnora Ip, MD  Active               Assessment/Plan:   Diabetes: - Currently uncontrolled with most recent A1C of 7.9% below goal <7%. Will assist in obtaining short fill of Synjardy  to avoid worsening BG. Continue to monitor weight loss on Trulicity .  - Last UACR Jan 2025 - 25 mg/g - Reviewed long term cardiovascular and renal outcomes of uncontrolled blood sugar - Reviewed goal A1c, goal fasting, and goal 2 hour post prandial glucose - Reviewed dietary modifications including  utilizing the healthy plate method, limiting portion size of carbohydrate foods, increasing intake of protein  and non-starchy vegetables. Counseled patient to stay hydrated with water throughout the day. - Recommend to continue Trulicity  0.75 mg weekly, Synjardy  (empagliflozin -metformin ) 12.05-998 mg BID, pioglitazone  30 mg daily - Next A1C due 01/12/24     Medication Management: - Currently strategy insufficient to maintain appropriate adherence to prescribed medication regimen - Requested short fill of Synjardy  and metoprolol  to get patient to pill pack ship date of 11/15/23 (which will include everything except pioglitazone  and venlafaxine  due to refill too soon). Patient should be mailed out 10 day supply of Synjardy  and metoprolol  tomorrow.    Written patient instructions provided. Patient verbalized understanding of treatment plan.   Follow Up Plan:  Pharmacist follow-up with [facility] pharmacy for pill packs in ~1 week PCP clinic visit in 12/10/23   Lorain Baseman, PharmD [facility] [phone]

## 2023-11-03 ENCOUNTER — Other Ambulatory Visit: Payer: Self-pay

## 2023-11-03 ENCOUNTER — Telehealth: Payer: Self-pay

## 2023-11-03 ENCOUNTER — Other Ambulatory Visit (HOSPITAL_COMMUNITY): Payer: Self-pay

## 2023-11-03 NOTE — Telephone Encounter (Signed)
 Discussed planned fill dates for adherence packaging with Dylan Burnette, CPhT. Patient received 10ds of Synjardy  and metoprolol  on 10/28/23. Will run out ~11/06/23. Packs to be filled tomorrow (with all meds except venlafaxine  and pioglitazone  due to refill too soon). Will follow-up with patient/son as planned on 11/17/23.   Lorain Baseman, PharmD [facility] [phone]

## 2023-11-04 ENCOUNTER — Other Ambulatory Visit (HOSPITAL_COMMUNITY): Payer: Self-pay

## 2023-11-04 ENCOUNTER — Other Ambulatory Visit: Payer: Self-pay

## 2023-11-05 ENCOUNTER — Other Ambulatory Visit (HOSPITAL_COMMUNITY): Payer: Self-pay

## 2023-11-05 ENCOUNTER — Other Ambulatory Visit: Payer: Self-pay

## 2023-11-06 ENCOUNTER — Other Ambulatory Visit: Payer: Self-pay

## 2023-11-17 ENCOUNTER — Other Ambulatory Visit: Payer: Self-pay

## 2023-11-17 ENCOUNTER — Other Ambulatory Visit (HOSPITAL_COMMUNITY): Payer: Self-pay

## 2023-11-17 ENCOUNTER — Other Ambulatory Visit (INDEPENDENT_AMBULATORY_CARE_PROVIDER_SITE_OTHER)

## 2023-11-17 DIAGNOSIS — E1142 Type 2 diabetes mellitus with diabetic polyneuropathy: Secondary | ICD-10-CM

## 2023-11-17 DIAGNOSIS — F32A Depression, unspecified: Secondary | ICD-10-CM

## 2023-11-17 DIAGNOSIS — I10 Essential (primary) hypertension: Secondary | ICD-10-CM

## 2023-11-17 DIAGNOSIS — E039 Hypothyroidism, unspecified: Secondary | ICD-10-CM

## 2023-11-17 DIAGNOSIS — Z794 Long term (current) use of insulin: Secondary | ICD-10-CM

## 2023-11-17 MED ORDER — SYNJARDY 12.5-1000 MG PO TABS
1.0000 | ORAL_TABLET | Freq: Two times a day (BID) | ORAL | 3 refills | Status: DC
  Filled 2023-11-17: qty 180, 90d supply, fill #0
  Filled ????-??-??: fill #0

## 2023-11-17 MED ORDER — VENLAFAXINE HCL ER 150 MG PO CP24
150.0000 mg | ORAL_CAPSULE | Freq: Every day | ORAL | 3 refills | Status: DC
  Filled 2023-11-17: qty 90, 90d supply, fill #0
  Filled ????-??-??: fill #0

## 2023-11-17 MED ORDER — BUSPIRONE HCL 5 MG PO TABS
5.0000 mg | ORAL_TABLET | Freq: Two times a day (BID) | ORAL | 3 refills | Status: DC
  Filled 2023-11-17: qty 180, 90d supply, fill #0
  Filled ????-??-??: fill #0

## 2023-11-17 MED ORDER — OLMESARTAN MEDOXOMIL 20 MG PO TABS
20.0000 mg | ORAL_TABLET | Freq: Every day | ORAL | 3 refills | Status: DC
  Filled 2023-11-17: qty 90, 90d supply, fill #0
  Filled ????-??-??: fill #0

## 2023-11-17 MED ORDER — PIOGLITAZONE HCL 30 MG PO TABS
30.0000 mg | ORAL_TABLET | Freq: Every day | ORAL | 3 refills | Status: DC
  Filled 2023-11-17: qty 90, 90d supply, fill #0
  Filled ????-??-??: fill #0

## 2023-11-17 MED ORDER — LEVOTHYROXINE SODIUM 50 MCG PO TABS
50.0000 ug | ORAL_TABLET | Freq: Every morning | ORAL | 3 refills | Status: DC
  Filled 2023-11-17: qty 90, 90d supply, fill #0
  Filled ????-??-??: fill #0

## 2023-11-17 MED ORDER — METOPROLOL SUCCINATE ER 50 MG PO TB24
50.0000 mg | ORAL_TABLET | Freq: Every day | ORAL | 3 refills | Status: DC
  Filled 2023-11-17: qty 90, 90d supply, fill #0
  Filled ????-??-??: fill #0

## 2023-11-17 NOTE — Progress Notes (Signed)
 11/17/2023 Name: [name] MRN: [id number] DOB: Apr 29, 1950  Chief Complaint  Patient presents with   Medication Adherence    Compliance Packaging    [name] is a 73 y.o. year old female who presented for a telephone visit. Spoke with patient's son [name], who manages medications.   They were referred to the pharmacist by their PCP for assistance in managing diabetes and medication adherence . PMH includes HTN, hypothyroidism, T2DM with polyneuropathy, HLD, cognitive impairment, depression.    Subjective: Patient was last seen by PCP, Hadassah Ala, MD, on 10/13/23. She was also consulted by pharmacy during this visit. She was accompanied by her son, [name], who manages her medications. BP was uncontrolled at this visit, but typically at goal, so no changes were made. A1C had improved from 8.9% to 7.9% on Trulicity  0.75 mg. Noted that patient has continued to lose weight, most recently she is 121 lbs. Discussed that if weight loss continues, may need to d/c GLP-1RA to preserve strength. For cognitive impairment, discussed trial of melatonin 5 mg at bedtime. We determined that she would be a good candidate for compliance packaging and her maintenance medications were sent to [facility] pharmacy. Discussed transition to adherence packs via telephone on 10/27/23 and assisted in preventing lapse in supply of Synjardy  and metoprolol . Confirmed with CPhT, Dylan Burnette, that first pill packs would be sent out on 11/04/23 with all maintenance medications except pioglitazone  and venlafaxine  (refill too soon).   Today, patient's son reports they got the first round of pill packs. He is requesting to switch metoprolol  to the evening. Also would like to find a way to separate out levothyroxine  so she can take this before breakfast and other medications, as he has been manually separating this time.   Care Team: Primary Care Provider: [name] Sick, MD ; Next Scheduled Visit: 12/10/22 - patient's son  says he cannot make this date, requests afternoon appt on 12/11/23   Medication Access/Adherence  Current Pharmacy:  [facility] [address] [place] [address] Phone: [phone] Fax: [phone]   Patient reports affordability concerns with their medications: No  Patient reports access/transportation concerns to their pharmacy: Yes  - has transitioned to pill packs with [facility]. Patient reports adherence concerns with their medications:  No  - Son [name] primarily helps with medications   Diabetes:  Current medications: Trulicity  0.75 mg weekly, Synjardy  (empagliflozin -metformin ) 12.05-998 mg BID, pioglitazone  30 mg daily Medications tried in the past: Ozempic  - significant weight loss  Current glucose readings: Downloaded 11/17/23       Medication Management:  Current adherence strategy: Compliance packaging (pill packs) with [place] pharmacy  Needing to rewrite prescriptions for metoprolol  (switch to evening) and try to separate out levothyroxine  for first med of the day.  Patient reports Good adherence to medications  Objective:  BP Readings from Last 3 Encounters:  10/13/23 (!) 142/86  07/09/23 111/70  05/22/23 130/64    Lab Results  Component Value Date   HGBA1C 7.9 (A) 10/13/2023   HGBA1C 8.9 (A) 07/09/2023   HGBA1C 8.6 (H) 04/20/2023       Latest Ref Rng & Units 04/20/2023    3:50 AM 01/15/2022   10:20 AM 07/21/2021    6:52 AM  BMP  Glucose 70 - 99 mg/dL 849  838  87   BUN 8 - 23 mg/dL 18  21  26    Creatinine 0.44 - 1.00 mg/dL 8.97  9.11  1.21   Sodium 135 - 145 mmol/L 137  138  140   Potassium 3.5 - 5.1 mmol/L 3.8  3.8  3.9   Chloride 98 - 111 mmol/L 104  104  106   CO2 22 - 32 mmol/L 22  25  25    Calcium  8.9 - 10.3 mg/dL 9.6  9.7  9.4     Lab Results  Component Value Date   CHOL 94 (L) 07/09/2023   HDL 43 07/09/2023   LDLCALC 36 07/09/2023   TRIG 68 07/09/2023   CHOLHDL  2.2 07/09/2023    Medications Reviewed Today   Medications were not reviewed in this encounter       Assessment/Plan:   Diabetes: - Currently uncontrolled with most recent A1C of 7.9% below goal <7%. Could consider A1C goal < 8% given recent desire to de-prescribe medications and inability to achieve goal < 7% on current regimen without insulin . Continue to monitor weight loss on Trulicity , but patient's son reports she is tolerating well for now.  - Last UACR Jan 2025 - 25 mg/g - Reviewed long term cardiovascular and renal outcomes of uncontrolled blood sugar - Reviewed goal A1c, goal fasting, and goal 2 hour post prandial glucose - Reviewed dietary modifications including  utilizing the healthy plate method, limiting portion size of carbohydrate foods, increasing intake of protein and non-starchy vegetables. Counseled patient to stay hydrated with water throughout the day. - Recommend to continue Trulicity  0.75 mg weekly, Synjardy  (empagliflozin -metformin ) 12.05-998 mg BID, pioglitazone  30 mg daily - Next A1C due 01/12/24     Medication Management: - Currently strategy sufficient, but several small adjustments related to timing of medications are needed after patient's son reviewed first pill packs - Will work with PCP to rewrite prescriptions to facilitate appropriate timing of medications 8AM: levothyroxine  (30 min before food and other medications) 12PM: buspirone  (first dose), Synjardy  (first dose), olmesartan , pioglitazone , venlafaxine  (patient's son would give these to her after breakfast/levothyroxine , but they are not able to fill pill pack with those instructions) 8PM (before bedtime): buspirone  (second dose), Synjardy  (second dose), metoprolol  succinate   Patient verbalized understanding of treatment plan.    Follow Up Plan:  Pharmacist follow-up via telephone ~ 1 mo PCP clinic visit in 12/10/23   Lorain Baseman, PharmD [facility] [phone]

## 2023-11-18 ENCOUNTER — Other Ambulatory Visit: Payer: Self-pay

## 2023-11-18 ENCOUNTER — Other Ambulatory Visit (HOSPITAL_COMMUNITY): Payer: Self-pay

## 2023-11-18 MED ORDER — LEVOTHYROXINE SODIUM 50 MCG PO TABS
50.0000 ug | ORAL_TABLET | Freq: Every day | ORAL | 3 refills | Status: DC
  Filled ????-??-??: fill #0

## 2023-11-18 MED ORDER — BUSPIRONE HCL 5 MG PO TABS
5.0000 mg | ORAL_TABLET | Freq: Two times a day (BID) | ORAL | 3 refills | Status: AC
  Filled 2023-11-28 – 2023-12-01 (×2): qty 60, 30d supply, fill #0
  Filled 2023-12-18 – 2023-12-24 (×2): qty 60, 30d supply, fill #1
  Filled 2024-02-12 (×2): qty 180, 90d supply, fill #2
  Filled 2024-02-12: qty 60, 30d supply, fill #2

## 2023-11-18 MED ORDER — SYNJARDY 12.5-1000 MG PO TABS
1.0000 | ORAL_TABLET | Freq: Two times a day (BID) | ORAL | 3 refills | Status: DC
  Filled ????-??-??: fill #0

## 2023-11-18 MED ORDER — SYNJARDY 12.5-1000 MG PO TABS
1.0000 | ORAL_TABLET | Freq: Two times a day (BID) | ORAL | 3 refills | Status: AC
  Filled 2023-11-28 – 2023-12-01 (×2): qty 60, 30d supply, fill #0
  Filled 2023-12-18 – 2023-12-24 (×2): qty 60, 30d supply, fill #1
  Filled 2024-02-12: qty 60, 30d supply, fill #2
  Filled 2024-02-12: qty 180, 90d supply, fill #2

## 2023-11-18 MED ORDER — PIOGLITAZONE HCL 30 MG PO TABS
30.0000 mg | ORAL_TABLET | Freq: Every day | ORAL | 3 refills | Status: AC
  Filled 2023-11-28 – 2023-12-01 (×2): qty 30, 30d supply, fill #0
  Filled 2023-12-18: qty 90, 90d supply, fill #0
  Filled 2023-12-24: qty 30, 30d supply, fill #0
  Filled 2024-02-12: qty 90, 90d supply, fill #1
  Filled 2024-02-12: qty 30, 30d supply, fill #1

## 2023-11-18 MED ORDER — BUSPIRONE HCL 5 MG PO TABS
5.0000 mg | ORAL_TABLET | Freq: Two times a day (BID) | ORAL | 3 refills | Status: DC
  Filled ????-??-??: fill #0

## 2023-11-18 MED ORDER — LEVOTHYROXINE SODIUM 50 MCG PO TABS
50.0000 ug | ORAL_TABLET | Freq: Every day | ORAL | 3 refills | Status: DC
  Filled 2023-11-28: qty 30, 30d supply, fill #0
  Filled 2023-12-01 – 2023-12-02 (×2): qty 90, 90d supply, fill #0
  Filled 2024-02-12: qty 90, 90d supply, fill #1

## 2023-11-18 MED ORDER — VENLAFAXINE HCL ER 150 MG PO CP24
150.0000 mg | ORAL_CAPSULE | Freq: Every day | ORAL | 3 refills | Status: AC
  Filled 2023-11-28 – 2023-12-01 (×2): qty 30, 30d supply, fill #0
  Filled 2023-12-18 – 2023-12-24 (×2): qty 30, 30d supply, fill #1
  Filled 2024-02-12: qty 90, 90d supply, fill #2
  Filled 2024-02-12: qty 30, 30d supply, fill #2

## 2023-11-18 MED ORDER — METOPROLOL SUCCINATE ER 50 MG PO TB24
50.0000 mg | ORAL_TABLET | Freq: Every day | ORAL | 3 refills | Status: AC
  Filled 2023-11-28 – 2023-12-01 (×2): qty 30, 30d supply, fill #0
  Filled 2023-12-18 – 2023-12-24 (×2): qty 30, 30d supply, fill #1
  Filled 2024-02-12: qty 30, 30d supply, fill #2
  Filled 2024-02-12: qty 90, 90d supply, fill #2

## 2023-11-18 MED ORDER — OLMESARTAN MEDOXOMIL 20 MG PO TABS
20.0000 mg | ORAL_TABLET | Freq: Every day | ORAL | 3 refills | Status: DC
  Filled 2023-11-28 – 2023-12-01 (×2): qty 30, 30d supply, fill #0

## 2023-11-18 NOTE — Addendum Note (Signed)
 Addended by: BRINDA LORAIN SQUIBB on: 11/18/2023 11:51 AM   Modules accepted: Orders

## 2023-11-18 NOTE — Addendum Note (Signed)
 Addended by: Darcel Zick L on: 11/18/2023 03:25 PM   Modules accepted: Orders

## 2023-11-26 ENCOUNTER — Other Ambulatory Visit: Payer: Self-pay

## 2023-11-26 ENCOUNTER — Telehealth: Payer: Self-pay

## 2023-11-26 DIAGNOSIS — E1142 Type 2 diabetes mellitus with diabetic polyneuropathy: Secondary | ICD-10-CM

## 2023-11-26 NOTE — Telephone Encounter (Signed)
 Copied from CRM [id number]. Topic: Clinical - Medication Refill >> Nov 26, 2023  4:54 PM Chiquita SQUIBB wrote: Medication: Dexcom Sensors - Not on patients med list  Patients son stated that he usually works with Lorain Baseman the office pharmacist to get these delivered    Has the patient contacted their pharmacy? Yes (Agent: If no, request that the patient contact the pharmacy for the refill. If patient does not wish to contact the pharmacy document the reason why and proceed with request.) (Agent: If yes, when and what did the pharmacy advise?)  This is the patient's preferred pharmacy:  [place] [facility] [address] [place] [address] Phone: [phone] Fax: [phone]  Is this the correct pharmacy for this prescription? Yes If no, delete pharmacy and type the correct one.   Has the prescription been filled recently? No  Is the patient out of the medication? Yes  Has the patient been seen for an appointment in the last year OR does the patient have an upcoming appointment? Yes  Can we respond through MyChart? No  Agent: Please be advised that Rx refills may take up to 3 business days. We ask that you follow-up with your pharmacy.

## 2023-11-26 NOTE — Telephone Encounter (Signed)
 Copied from CRM [id number]. Topic: Clinical - Medication Refill >> Nov 26, 2023  4:54 PM Chiquita SQUIBB wrote: Medication: Dexcom Sensors - Not on patients med list   Patients son stated that he usually works with Lorain Baseman the office pharmacist to get these delivered     Has the patient contacted their pharmacy? Yes (Agent: If no, request that the patient contact the pharmacy for the refill. If patient does not wish to contact the pharmacy document the reason why and proceed with request.) (Agent: If yes, when and what did the pharmacy advise?)   This is the patient's preferred pharmacy:  [place] [facility] [address] [place] [address] Phone: [phone] Fax: [phone]

## 2023-11-27 ENCOUNTER — Other Ambulatory Visit (HOSPITAL_COMMUNITY): Payer: Self-pay

## 2023-11-27 ENCOUNTER — Telehealth (HOSPITAL_COMMUNITY): Payer: Self-pay

## 2023-11-27 ENCOUNTER — Other Ambulatory Visit: Payer: Self-pay

## 2023-11-27 MED ORDER — DEXCOM G7 SENSOR MISC
3 refills | Status: DC
  Filled 2023-11-27: qty 9, 90d supply, fill #0

## 2023-11-27 NOTE — Telephone Encounter (Addendum)
 Placed orders for Dexcom G7 sensors (3 mo supply) to [employer] [facility] for mail order per standing protocol.   Lorain Baseman, PharmD [facility] [phone]

## 2023-11-27 NOTE — Telephone Encounter (Signed)
 Pharmacy Patient Advocate Encounter   Received notification from Patient Pharmacy that prior authorization for Dexcom G7 Sensor  is required/requested.   Insurance verification completed.   The patient is insured through [employer].   Per test claim: PA required; PA submitted to above mentioned insurance via Latent Key/confirmation #/EOC [id number] Status is pending

## 2023-11-27 NOTE — Telephone Encounter (Addendum)
 PA required. Notified patient's son [name] that there may be a delay in when the sensors can be mailed out from the pharmacy. Will follow-up with alternative options if PA is denied, as patient is no longer taking insulin . She may be eligible for the LIBERATE study if next A1C is > 8%.   PA submitted 11/27/23: Key: [id number]  Lorain Baseman, PharmD [facility] [phone]

## 2023-11-27 NOTE — Addendum Note (Signed)
 Addended by: BRINDA LORAIN SQUIBB on: 11/27/2023 10:42 AM   Modules accepted: Orders

## 2023-11-28 ENCOUNTER — Other Ambulatory Visit: Payer: Self-pay

## 2023-11-28 ENCOUNTER — Other Ambulatory Visit (HOSPITAL_COMMUNITY): Payer: Self-pay

## 2023-12-01 ENCOUNTER — Other Ambulatory Visit: Payer: Self-pay

## 2023-12-01 NOTE — Telephone Encounter (Signed)
 Pharmacy Patient Advocate Encounter  Received notification from [facility] that Prior Authorization for  Dexcom G7 Sensor   has been DENIED.  See denial reason below. No denial letter attached in CMM. Will attach denial letter to Media tab once received.   PA #/Case ID/Reference #: [id number]

## 2023-12-02 ENCOUNTER — Other Ambulatory Visit (HOSPITAL_COMMUNITY): Payer: Self-pay

## 2023-12-02 ENCOUNTER — Other Ambulatory Visit: Payer: Self-pay

## 2023-12-10 ENCOUNTER — Ambulatory Visit

## 2023-12-10 NOTE — Progress Notes (Signed)
 Established Patient Office Visit  Subjective   Patient ID: [name], female    DOB: Jan 28, 1951  Age: 73 y.o. MRN: [id number]  Chief Complaint  Patient presents with   Routine Checkup    Memory Loss    [name] is a 73 year old female with a pertinent past medical history of type 2 diabetes mellitus, cognitive impairment, hypothyroidism, and hypertension who presents today to discuss the need for continuous glucose monitoring. She presents with her son, [name], who helps manage her medications and other activities of daily living. They have recently visited with Dr. Lorain Gardner to assess if the patient qualifies for the LIBERATE study and CGMs. Please see problem-based assessment and plan below for further details.      Review of Systems  Constitutional: Negative.   Eyes: Negative.   Respiratory: Negative.    Cardiovascular: Negative.   Gastrointestinal: Negative.   Genitourinary: Negative.   Musculoskeletal: Negative.   Neurological: Negative.   Endo/Heme/Allergies: Negative.   Psychiatric/Behavioral:  Positive for memory loss.      Objective:    BP (!) 150/78 (BP Location: Left Arm, Patient Position: Sitting, Cuff Size: Normal)   Pulse 90   Temp 98.2 F (36.8 C) (Oral)   Ht 5' 3 (1.6 m)   Wt 124 lb 12.8 oz (56.6 kg)   SpO2 94%   BMI 22.11 kg/m  Physical Exam Constitutional:      Appearance: Normal appearance.  HENT:     Nose: Nose normal.     Mouth/Throat:     Mouth: Mucous membranes are moist.  Cardiovascular:     Rate and Rhythm: Normal rate and regular rhythm.     Pulses: Normal pulses.     Heart sounds: Normal heart sounds.  Pulmonary:     Effort: Pulmonary effort is normal.     Breath sounds: Normal breath sounds.  Abdominal:     General: Abdomen is flat. Bowel sounds are normal.     Palpations: Abdomen is soft.  Musculoskeletal:        General: Normal range of motion.  Skin:    General: Skin is warm.  Neurological:     General: No focal  deficit present.     Mental Status: She is alert. Mental status is at baseline.     Comments: Pill-rolling tremor present during visit  Psychiatric:        Mood and Affect: Mood normal.        Behavior: Behavior normal.     Results for orders placed or performed in visit on 12/11/23  Glucose, capillary  Result Value Ref Range   Glucose-Capillary 116 (H) 70 - 99 mg/dL  Results for orders placed or performed in visit on 12/11/23  POC Hbg A1C  Result Value Ref Range   Hemoglobin A1C     HbA1c POC (<> result, manual entry) 8.2 4.0 - 5.6 %   HbA1c, POC (prediabetic range)     HbA1c, POC (controlled diabetic range)      The ASCVD Risk score (Arnett DK, et al., 2019) failed to calculate for the following reasons:   The valid total cholesterol range is 130 to 320 mg/dL    Assessment & Plan:   Patient seen with Dr. Jone Gardner.  Problem List Items Addressed This Visit       Cardiovascular and Mediastinum   Essential hypertension (Chronic)   BP today slightly elevated at 150/78. Currently on olmesartan  20mg  and metoprolol  succinate 50mg  daily. BP was elevated at last  visit in September. BP was not monitored at home, but [name] has noted the patient has not been symptomatic recently. However, given concerns of dementia and risk for stroke with elevated BP, will increase Olmesartan  to 40mg  daily.   -Increase Olmesartan  to 40mg   -Continue Metoprolol  succinate 50mg  daily -Consider checking BMP at next visit. Cr/GFR have been stable since 1 year ago.      Relevant Medications   olmesartan  (BENICAR ) 40 MG tablet     Endocrine   Type 2 diabetes mellitus with diabetic polyneuropathy, with long-term current use of insulin  (HCC) - Primary (Chronic)   A1c today 8.2%, which qualifies for the LIBERATE study. [name] would like the patient to have a CGM as it is easier for him to track the patient's blood sugars, since the patient's blood sugars tend to be more sensitive to foods recently.  Current regimen is still Metformin  1000mg  BID, Trulicity  0.75mg  weekly, Jardiance  25mg  daily, and Actos  30mg  daily. Weight is stable at 124lbs today. Patient and [name] to follow up with Dr. Brinda on 12/1 for further medication management and weight monitoring for LIBERATE study.  -Continue current regimen -If weight loss worsens, stop Trulicity  0.75mg  weekly -A1c repeat in 3 months      Relevant Medications   olmesartan  (BENICAR ) 40 MG tablet   Other Relevant Orders   POC Hbg A1C (Completed)     Other   Cognitive impairment (Chronic)   Stable today. [name] is present today for her visit and states her sleep still is fluctuating, but the melatonin tends to best help her. He typically will give her the medication when she is having trouble, ~2x a week or so. Discussed that melatonin is a safe vitamin (his code word for maintaining patient compliance) that the patient is able to take nightly if her sleep cycles continue to progressively worsen.  Inform [name] that other sleep medications would not be beneficial for the patient and if melatonin is working for her, then we should continue this as needed and monitor for worsening frequency or duration of her delirium episodes.  He reports that overall, she is well and is still able to eat, drink, and remember family. Occasionally will have a delirious episode that she will promptly forget that can be taxing for [name], but the patient is stable. Will continue to monitor her symptoms for any acute worsening and update our clinic.      Weight loss (Chronic)   Improving. Weight today is 124 pounds, increased from 121 in September. She is still using Glucerna shakes. Still using Trucility 0.75mg  weekly. If weight continues to decrease in the future and A1c continues to remain stable, then consider removing GLP1 to prevent worsening of weight loss.        Return in about 3 months (around 03/12/2024) for A1c recheck.    Christy Tabor, DO Internal  Medicine Resident, PGY-1 5:29 PM 12/11/2023

## 2023-12-10 NOTE — Patient Instructions (Signed)
 Thank you, [name] for allowing us  to provide your care today. Today we discussed your diabetes and blood pressure.     New medications: -Olmesartan  40mg  daily  I have ordered the following labs for you:  Lab Orders         POC Hbg A1C       I will call if any are abnormal. All of your labs can be accessed through My Chart.   My Chart Access: [email or web address]  Please follow-up in: 3 months to check your A1c    We look forward to seeing you next time. Please call our clinic at [phone] if you have any questions or concerns. The best time to call is Monday-Friday from 9am-4pm, but there is someone available 24/7. If after hours or the weekend, call the main hospital number and ask for the Internal Medicine Resident On-Call. If you need medication refills, please notify your pharmacy one week in advance and they will send us  a request.   Thank you for letting us  take part in your care. Wishing you the best!  Christy Skilton, DO 12/11/2023, 5:29 PM [name]

## 2023-12-11 ENCOUNTER — Ambulatory Visit (INDEPENDENT_AMBULATORY_CARE_PROVIDER_SITE_OTHER)

## 2023-12-11 ENCOUNTER — Other Ambulatory Visit (HOSPITAL_COMMUNITY): Payer: Self-pay

## 2023-12-11 ENCOUNTER — Other Ambulatory Visit: Payer: Self-pay

## 2023-12-11 ENCOUNTER — Encounter

## 2023-12-11 VITALS — BP 150/78 | HR 90 | Temp 98.2°F | Ht 63.0 in | Wt 124.8 lb

## 2023-12-11 VITALS — BP 125/59 | HR 86 | Temp 98.2°F | Ht 63.0 in | Wt 124.8 lb

## 2023-12-11 DIAGNOSIS — Z8249 Family history of ischemic heart disease and other diseases of the circulatory system: Secondary | ICD-10-CM

## 2023-12-11 DIAGNOSIS — Z794 Long term (current) use of insulin: Secondary | ICD-10-CM | POA: Diagnosis not present

## 2023-12-11 DIAGNOSIS — Z79899 Other long term (current) drug therapy: Secondary | ICD-10-CM

## 2023-12-11 DIAGNOSIS — R4189 Other symptoms and signs involving cognitive functions and awareness: Secondary | ICD-10-CM | POA: Diagnosis not present

## 2023-12-11 DIAGNOSIS — Z7985 Long-term (current) use of injectable non-insulin antidiabetic drugs: Secondary | ICD-10-CM

## 2023-12-11 DIAGNOSIS — R634 Abnormal weight loss: Secondary | ICD-10-CM | POA: Diagnosis not present

## 2023-12-11 DIAGNOSIS — I10 Essential (primary) hypertension: Secondary | ICD-10-CM

## 2023-12-11 DIAGNOSIS — Z0000 Encounter for general adult medical examination without abnormal findings: Secondary | ICD-10-CM | POA: Diagnosis not present

## 2023-12-11 DIAGNOSIS — Z7984 Long term (current) use of oral hypoglycemic drugs: Secondary | ICD-10-CM

## 2023-12-11 DIAGNOSIS — Z833 Family history of diabetes mellitus: Secondary | ICD-10-CM

## 2023-12-11 DIAGNOSIS — E1142 Type 2 diabetes mellitus with diabetic polyneuropathy: Secondary | ICD-10-CM

## 2023-12-11 LAB — POCT GLYCOSYLATED HEMOGLOBIN (HGB A1C): HbA1c POC (<> result, manual entry): 8.2 % (ref 4.0–5.6)

## 2023-12-11 LAB — GLUCOSE, CAPILLARY: Glucose-Capillary: 116 mg/dL — ABNORMAL HIGH (ref 70–99)

## 2023-12-11 MED ORDER — OLMESARTAN MEDOXOMIL 40 MG PO TABS
40.0000 mg | ORAL_TABLET | Freq: Every day | ORAL | 3 refills | Status: AC
  Filled 2023-12-11: qty 90, 90d supply, fill #0
  Filled 2024-02-12 – 2024-02-17 (×2): qty 90, 90d supply, fill #1

## 2023-12-11 NOTE — Patient Instructions (Addendum)
 Sensor Application If using the App, you can tap Help in the Main Menu to access an in-app tutorial on applying a Sensor. See below for instructions on how to download the app. Apply Sensors only on the back of your upper arm. If placed in other areas, the Sensor may not function properly and could give you inaccurate readings. Avoid areas with scars, moles, stretch marks, or lumps.   Select an area of skin that generally stays flat during your normal daily activities (no bending or folding). Choose a site that is at least 1 inch (2.5 cm) away from any injection sites. To prevent discomfort or skin irritation, you should select a different site other than the one most recently used. Wash application site using a plain soap, dry, and then clean with an alcohol  wipe. This will help remove any oily residue that may prevent the sensor from sticking properly. Allow site to air dry before proceeding. Note: The area MUST be clean and dry, or the Sensor may not stay on for the full wear duration specified by your Sensor insert. 4. Unscrew the cap from the Sensor Applicator and set the cap aside.  5. Place the Sensor Applicator over the prepared site and push down firmly to apply the Sensor to your body. 6. Gently pull the Sensor Applicator away from your body. The Sensor should now be attached to your skin. 7. Make sure the Sensor is secure after application. Put the cap back on the Sensor Applicator. Discard the used Engineer, Agricultural according to local regulations.   What If My Sensor Falls Off or What If My Sensor Isn't Working? Call [employer] Customer Care Team at [phone] or request sensor replacement online at [email or web address].[email or web address]/[email or web address]/[email or web address]  Available 7 days a week from 8AM-8PM EST, excluding holidays     The App Download the [employer] app in your phone's app store   Load the app and select get started now Create an account  Tap scan new sensor Follow the  prompts on the screen. If your sensor does not sync, try moving your phone slowly around the sensor. Phone cases may affect scanning. This will be the only time you have to scan the sensor until you apply a new sensor.   There will be a 60 minute start up period until the app will display your glucose reading  To share your readings with a family member, download the [employer] App:       How To Share Your Readings With Us  Once in the app, go to settings -> connected apps -> [employer] -> Enter Practice ID -> [id number]   Lorain Baseman, PharmD [facility] [phone]

## 2023-12-11 NOTE — Assessment & Plan Note (Signed)
 BP today slightly elevated at 150/78. Currently on olmesartan  20mg  and metoprolol  succinate 50mg  daily. BP was elevated at last visit in September. BP was not monitored at home, but [name] has noted the patient has not been symptomatic recently. However, given concerns of dementia and risk for stroke with elevated BP, will increase Olmesartan  to 40mg  daily.   -Increase Olmesartan  to 40mg   -Continue Metoprolol  succinate 50mg  daily -Consider checking BMP at next visit. Cr/GFR have been stable since 1 year ago.

## 2023-12-11 NOTE — Assessment & Plan Note (Addendum)
 A1c today 8.2%, which qualifies for the LIBERATE study. [name] would like the patient to have a CGM as it is easier for him to track the patient's blood sugars, since the patient's blood sugars tend to be more sensitive to foods recently. Current regimen is still Metformin  1000mg  BID, Trulicity  0.75mg  weekly, Jardiance  25mg  daily, and Actos  30mg  daily. Weight is stable at 124lbs today. Patient and [name] to follow up with Dr. Brinda on 12/1 for further medication management and weight monitoring for LIBERATE study.  -Continue current regimen -If weight loss worsens, stop Trulicity  0.75mg  weekly -A1c repeat in 3 months

## 2023-12-11 NOTE — Progress Notes (Signed)
 Internal Medicine Clinic Attending  I was physically present during the key portions of the resident provided service and participated in the medical decision making of patient's management care. I reviewed pertinent patient test results.  The assessment, diagnosis, and plan were formulated together and I agree with the documentation in the resident's note.  [name] Sick, MD

## 2023-12-11 NOTE — Progress Notes (Unsigned)
 S:     No chief complaint on file.  73 y.o. female who presents for diabetes evaluation, education, and management in the context of the LIBERATE Study.  Today, patient arrives in *** good spirits and presents without *** any assistance. ***Patient is accompanied by ***.   Patient was referred and last seen by Primary Care Provider, Dr. ***, on ***.  At last visit, ***.   PMH is significant for ***.   Patient reports Diabetes was diagnosed ***.   Family/Social History: ***  Current diabetes medications include: *** Current hypertension medications include: *** Current hyperlipidemia medications include: ***  Patient reports adherence to taking all medications as prescribed.  *** Patient denies adherence with medications, reports missing *** medications *** times per week, on average.  Do you feel that your medications are working for you? {YES NO:22349} Have you been experiencing any side effects to the medications prescribed? {YES NO:22349} Do you have any problems obtaining medications due to transportation or finances? {YES [id number] Insurance coverage: ***  Patient {Actions; denies-reports:120008} hypoglycemic events.  @CGMFLO @  Patient {Actions; denies-reports:120008} nocturia (nighttime urination).  Patient {Actions; denies-reports:120008} neuropathy (nerve pain). Patient {Actions; denies-reports:120008} visual changes. Patient {Actions; denies-reports:120008} self foot exams.   Patient reported dietary habits: Eats *** meals/day Breakfast: *** Lunch: *** Dinner: *** Snacks: *** Drinks: ***  Within the past 12 months, did you worry whether your food would run out before you got money to buy more? {YES NO:22349} Within the past 12 months, did the food you bought run out, and you didn't have money to get more? {YES NO:22349} PHQ-9 Score: ***  Patient-reported exercise habits: ***   O:   ROS  Physical Exam   Lab Results  Component Value Date    HGBA1C 7.9 (A) 10/13/2023    There were no vitals filed for this visit.   Lipid Panel     Component Value Date/Time   CHOL 94 (L) 07/09/2023 1140   TRIG 68 07/09/2023 1140   HDL 43 07/09/2023 1140   CHOLHDL 2.2 07/09/2023 1140   CHOLHDL 4.1 07/20/2013 1604   VLDL 20 07/20/2013 1604   LDLCALC 36 07/09/2023 1140       Latest Ref Rng & Units 04/20/2023    3:50 AM 01/15/2022   10:20 AM 07/21/2021    6:52 AM  BMP  Glucose 70 - 99 mg/dL 849  838  87   BUN 8 - 23 mg/dL 18  21  26    Creatinine 0.44 - 1.00 mg/dL 8.97  9.11  8.78   Sodium 135 - 145 mmol/L 137  138  140   Potassium 3.5 - 5.1 mmol/L 3.8  3.8  3.9   Chloride 98 - 111 mmol/L 104  104  106   CO2 22 - 32 mmol/L 22  25  25    Calcium  8.9 - 10.3 mg/dL 9.6  9.7  9.4      Clinical Atherosclerotic Cardiovascular Disease (ASCVD): {YES/NO:21197} The ASCVD Risk score (Arnett DK, et al., 2019) failed to calculate for the following reasons:   The valid total cholesterol range is 130 to 320 mg/dL      A/P:  LIBERATE Study:  -Patient {liberateconsent:28834} verbal consent to participate in the study. Consent documented in electronic medical record.  -Provided education on Libre 3 CGM. Collaborated to ensure Herlene 3 app was downloaded on patient's phone. Educated on how to place sensor every 14 days, patient placed first sensor correctly and verbalized understanding of use, removal, and  how to place next sensor. Discussed alarms. 8 sensors provided for a 3 month supply. Educated to contact the office if the sensor falls off early and replacements are needed before their next [employer].    Diabetes longstanding *** currently ***. Patient is *** able to verbalize appropriate hypoglycemia management plan. Medication adherence appears ***. Control is suboptimal due to ***. -{Meds adjust:18428} basal insulin  *** (insulin  ***). Patient will continue to titrate 1 unit every *** days if fasting blood sugar > 100mg /dl until fasting  blood sugars reach goal or next visit.  -{Meds adjust:18428} rapid insulin  *** (insulin  ***) to ***.  -{Meds adjust:18428} GLP-1 *** (generic ***) to ***.  -{Meds adjust:18428} SGLT2-I *** (generic ***) to ***. Counseled on sick day rules. -{Meds adjust:18428} metformin  *** to ***.  -Patient educated on purpose, proper use, and potential adverse effects of ***.  -Extensively discussed pathophysiology of diabetes, recommended lifestyle interventions, dietary effects on blood sugar control.  -Counseled on s/sx of and management of hypoglycemia.  -Next A1c anticipated ***.   ASCVD risk - primary ***secondary prevention in patient with diabetes. Last LDL is *** not at goal of <29 *** mg/dL. ASCVD risk factors include *** and 10-year ASCVD risk score of ***. {Desc; low/moderate/high:110033} intensity statin indicated.  -{Meds adjust:18428} ***statin *** mg.   Hypertension longstanding *** currently ***. Blood pressure goal of <130/80 *** mmHg. Medication adherence ***. Blood pressure control is suboptimal due to ***. -***  Written patient instructions provided. Patient verbalized understanding of treatment plan.  Total time in face to face counseling *** minutes.    Follow-up:  Pharmacist ***. PCP clinic visit in ***.

## 2023-12-11 NOTE — Assessment & Plan Note (Signed)
 Stable today. [name] is present today for her visit and states her sleep still is fluctuating, but the melatonin tends to best help her. He typically will give her the medication when she is having trouble, ~2x a week or so. Discussed that melatonin is a safe vitamin (his code word for maintaining patient compliance) that the patient is able to take nightly if her sleep cycles continue to progressively worsen.  Inform [name] that other sleep medications would not be beneficial for the patient and if melatonin is working for her, then we should continue this as needed and monitor for worsening frequency or duration of her delirium episodes.  He reports that overall, she is well and is still able to eat, drink, and remember family. Occasionally will have a delirious episode that she will promptly forget that can be taxing for [name], but the patient is stable. Will continue to monitor her symptoms for any acute worsening and update our clinic.

## 2023-12-11 NOTE — Patient Instructions (Signed)

## 2023-12-11 NOTE — Progress Notes (Signed)
 Chief Complaint  Patient presents with   Medicare Wellness     Subjective:   [name] is a 73 y.o. female who presents for a Medicare Annual Wellness Visit.  Allergies (verified) Gabapentin , Meclizine  hcl, Metformin  hcl er, Penicillins, and Sulfa antibiotics   History: Past Medical History:  Diagnosis Date   Advanced care planning/counseling discussion 05/29/2021   Arthritis    Bilateral hip pain 02/24/2015   Borderline glaucoma of both eyes    Depression    Diabetic polyneuropathy (HCC)    Diverticulosis of colon    Dizziness 12/05/2017   Dry eyes, bilateral    Fall 08/02/2021   Feeling of incomplete bladder emptying    Frequent falls 01/08/2018   Gait abnormality 07/19/2021   History of breast cancer oncologist-  dr vivien-- per lov note no recurrence   dx 04/ 1999 --- Stage 3B-- s/p  chemotherapy then right mastectomy then concurrent chemoradiation therapy   History of urinary retention    01/ 2018   Hydronephrosis 04/01/2017   Hydronephrosis of right kidney    Hyperlipidemia    Hypertension    Hypokalemia 12/09/2016   Hypomagnesemia 12/09/2016   Hypothyroidism    Insulin  dependent type 2 diabetes mellitus [facility])    endocrinologist-  dr trixie---  last A1c 8.7 on 02-20-2016   Left leg pain 03/15/2019   LOW BACK PAIN 04/16/2006   Qualifier: Diagnosis of  By: Elizabeth MD, Heron     Lymphedema of upper extremity    right   Macular degeneration, left eye    followed by dr alvia   Renal insufficiency    Seizure-like activity (HCC) 04/17/2019   Urgency of urination    Visual hallucinations 01/12/2019   Vitamin D  deficiency 08/17/2019   Weight loss 08/17/2019   Past Surgical History:  Procedure Laterality Date   CARPAL TUNNEL RELEASE Right 2017   and tendon repair   CATARACT EXTRACTION W/ INTRAOCULAR LENS  IMPLANT, BILATERAL  2017   CYSTO/  RIGHT RETROGRADE PYELOGRAM/  UNROOFING RIGHT URETEROCELE  09/18/2000    CYSTOSCOPY/RETROGRADE/URETEROSCOPY Right 05/09/2016   Procedure: CYSTOSCOPY and right RETROGRADE;  Surgeon: Norleen Seltzer, MD;  Location: [facility];  Service: Urology;  Laterality: Right;   FINGER SURGERY Left x2 prior to 09-09-2014   I & D EXTREMITY Left 09/09/2014   Procedure: IRRIGATION AND DEBRIDEMENT LEFT THUMB DISTAL PHALANX;  Surgeon: Arley Curia, MD;  Location: [facility];  Service: Orthopedics;  Laterality: Left;   MASTECTOMY Right 1999   w/  Node dissection's   PORT-A-CATH REMOVAL  05/01/1999   TUBAL LIGATION     VAGINAL HYSTERECTOMY  1970's   WRIST GANGLION EXCISION Right 2016   Family History  Problem Relation Age of Onset   Heart disease Father    Diabetes Father    Stroke Sister    Anuerysm Sister 44       brain; maternal half-sister   Heart disease Brother    Anuerysm Brother 98       aortic; maternal half-brother   Breast cancer Daughter 8       negative genetic testing in 2015   Heart attack Daughter        46-47   Diabetes Paternal Grandmother    Stroke Paternal Grandfather    Anuerysm Brother        NOS type; full brother   Fibroids Daughter        s/p hysterectomy at 39y   Brain cancer Maternal Uncle  dx. older than 80; NOS type   Brain cancer Cousin        maternal 1st cousin; d. early 48s; NOS type   Deafness Paternal Uncle        prelingual   Social History   Occupational History   Not on file  Tobacco Use   Smoking status: Never   Smokeless tobacco: Never  Vaping Use   Vaping status: Never Used  Substance and Sexual Activity   Alcohol  use: No    Alcohol /week: 0.0 standard drinks of alcohol    Drug use: No   Sexual activity: Not on file   Tobacco Counseling Counseling given: Not Answered  SDOH Screenings   Food Insecurity: No Food Insecurity (12/11/2023)  Housing: Unknown (12/11/2023)  Transportation Needs: No Transportation Needs (12/11/2023)  Utilities: Not At Risk (12/11/2023)  Alcohol  Screen: Low  Risk  (10/30/2022)  Depression (PHQ2-9): Low Risk  (12/11/2023)  Financial Resource Strain: Low Risk  (10/30/2022)  Physical Activity: Inactive (12/11/2023)  Social Connections: Socially Isolated (12/11/2023)  Stress: No Stress Concern Present (12/11/2023)  Tobacco Use: Low Risk  (12/11/2023)  Health Literacy: Adequate Health Literacy (12/11/2023)   See flowsheets for full screening details  Depression Screen PHQ 2 & 9 Depression Scale- Over the past 2 weeks, how often have you been bothered by any of the following problems? Little interest or pleasure in doing things: 0 Feeling down, depressed, or hopeless (PHQ Adolescent also includes...irritable): 0 PHQ-2 Total Score: 0     Goals Addressed   None    Visit info / Clinical Intake: Medicare Wellness Visit Type:: Subsequent Annual Wellness Visit Persons participating in visit:: patient Medicare Wellness Visit Mode:: In-person (required for WTM) Information given by:: patient Interpreter Needed?: No Pre-visit prep was completed: yes AWV questionnaire completed by patient prior to visit?: no Living arrangements:: with family/others Patient's Overall Health Status Rating: good Typical amount of pain: none Does pain affect daily life?: no Are you currently prescribed opioids?: no  Dietary Habits and Nutritional Risks How many meals a day?: 3 Eats fruit and vegetables daily?: yes Most meals are obtained by: preparing own meals In the last 2 weeks, have you had any of the following?: none Diabetic:: (!) yes Any non-healing wounds?: no How often do you check your BS?: continuous glucose monitor Would you like to be referred to a Nutritionist or for Diabetic Management? : no  Functional Status Activities of Daily Living (to include ambulation/medication): (!) Needs Assist Feeding: Independent Dressing/Grooming: Needs assistance Bathing: Needs assistance Toileting: Needs assistance Transfer: Needs assistance Ambulation:  Independent with device- listed below Home Assistive Devices/Equipment: Walker (specify Type); Cane Medication Administration: Needs assistance (comment) Home Management: Needs assistance (comment) Manage your own finances?: (!) no (Pt states her son and daughter handle the finances part.) Primary transportation is: family/friends Concerns about vision?: no *vision screening is required for WTM* Concerns about hearing?: no  Fall Screening Falls in the past year?: 0 Number of falls in past year: 0 Was there an injury with Fall?: 0 Fall Risk Category Calculator: 0 Patient Fall Risk Level: Low Fall Risk  Fall Risk Patient at Risk for Falls Due to: No Fall Risks Fall risk Follow up: Falls evaluation completed  Home and Transportation Safety: All rugs have non-skid backing?: (!) no All stairs or steps have railings?: (!) no Grab bars in the bathtub or shower?: (!) no Have non-skid surface in bathtub or shower?: (!) no Good home lighting?: yes Regular seat belt use?: yes Hospital stays in the  last year:: no  Cognitive Assessment Difficulty concentrating, remembering, or making decisions? : no Will 6CIT or Mini Cog be Completed: yes What month is it?: 3 points About what time is it?: 0 points Say the months of the year in reverse: 0 points Repeat the address phrase from earlier: 0 points  Advance Directives (For Healthcare) Does Patient Have a Medical Advance Directive?: Yes Type of Advance Directive: Healthcare Power of Attorney Copy of Healthcare Power of Attorney in Chart?: No - copy requested Would patient like information on creating a medical advance directive?: No - Patient declined  Reviewed/Updated  Reviewed/Updated: Reviewed All (Medical, Surgical, Family, Medications, Allergies, Care Teams, Patient Goals); Medical History; Surgical History; Family History; Medications; Allergies; Care Teams; Patient Goals        Objective:    Today's Vitals   12/11/23 1512   BP: (!) 125/59  Pulse: 86  Temp: 98.2 F (36.8 C)  TempSrc: Oral  Weight: 124 lb 12.8 oz (56.6 kg)  Height: 5' 3 (1.6 m)   Body mass index is 22.11 kg/m.  Current Medications (verified) Outpatient Encounter Medications as of 12/11/2023  Medication Sig   busPIRone  (BUSPAR ) 5 MG tablet Take 1 tablet (5 mg total) by mouth 2 (two) times daily. At noon and before bedtime.   Continuous Glucose Sensor (DEXCOM G7 SENSOR) MISC Change sensor every 10 days   Dulaglutide  (TRULICITY ) 0.75 MG/0.5ML SOAJ INJECT THE CONTENTS OF ONE PEN  SUBCUTANEOUSLY WEEKLY AS  DIRECTED   Empagliflozin -metFORMIN  HCl (SYNJARDY ) 12.05-998 MG TABS Take 1 tablet by mouth 2 (two) times daily. At noon and before bedtime.   glucose 4 GM chewable tablet Chew 4 tablets (16 g total) by mouth as needed for low blood sugar.   latanoprost (XALATAN) 0.005 % ophthalmic solution 1 drop at bedtime.   levothyroxine  (SYNTHROID ) 50 MCG tablet Take 1 tablet (50 mcg total) by mouth daily. Take at 8am, 30 minutes before food and other medications.   metoprolol  succinate (TOPROL -XL) 50 MG 24 hr tablet Take 1 tablet (50 mg total) by mouth at bedtime.   Nutritional Supplements (GLUCERNA SNACK SHAKE) LIQD Take 1 Dose by mouth 3 (three) times daily as needed.   olmesartan  (BENICAR ) 20 MG tablet Take 1 tablet (20 mg total) by mouth daily at 12 noon.   pioglitazone  (ACTOS ) 30 MG tablet Take 1 tablet (30 mg total) by mouth daily at 12 noon.   venlafaxine  XR (EFFEXOR -XR) 150 MG 24 hr capsule Take 1 capsule (150 mg total) by mouth daily at 12 noon.   No facility-administered encounter medications on file as of 12/11/2023.   Hearing/Vision screen No results found. Immunizations and Health Maintenance Health Maintenance  Topic Date Due   Zoster Vaccines- Shingrix (1 of 2) Never done   COVID-19 Vaccine (3 - Moderna risk series) 08/14/2019   Colonoscopy  02/05/2022   Influenza Vaccine  08/29/2023   DTaP/Tdap/Td (3 - Td or Tdap) 12/10/2023    OPHTHALMOLOGY EXAM  12/02/2023   Diabetic kidney evaluation - Urine ACR  02/12/2024   HEMOGLOBIN A1C  03/12/2024   Diabetic kidney evaluation - eGFR measurement  04/19/2024   FOOT EXAM  04/29/2024   LIPID PANEL  07/08/2024   Mammogram  08/03/2024   Medicare Annual Wellness (AWV)  12/10/2024   Pneumococcal Vaccine: 50+ Years  Completed   DEXA SCAN  Completed   Hepatitis C Screening  Completed   Meningococcal B Vaccine  Aged Out        Assessment/Plan:  This is a  routine wellness examination for [name].  Patient Care Team: [name] Sick, MD as PCP - General Kate Lonni CROME, MD as PCP - Cardiology (Cardiology) Alvia Norleen BIRCH, MD as Attending Physician (Ophthalmology) Loreda Hacker, DPM as Consulting Physician (Podiatry) Watt Norleen, MD as Attending Physician (Urology)  I have personally reviewed and noted the following in the patient's chart:   Medical and social history Use of alcohol , tobacco or illicit drugs  Current medications and supplements including opioid prescriptions. Functional ability and status Nutritional status Physical activity Advanced directives List of other physicians Hospitalizations, surgeries, and ER visits in previous 12 months Vitals Screenings to include cognitive, depression, and falls Referrals and appointments  No orders of the defined types were placed in this encounter.  In addition, I have reviewed and discussed with patient certain preventive protocols, quality metrics, and best practice recommendations. A written personalized care plan for preventive services as well as general preventive health recommendations were provided to patient.   Kari Staff, CMA   12/11/2023   Return in 1 year (on 12/10/2024).  After Visit Summary: (In Person-Printed) AVS printed and given to the patient  Nurse Notes: Face-To-Face Visit Ms. Wagoner,  Thank you for taking the time for your Medicare Wellness Visit. I appreciate your continued  commitment to your health goals. Please review the care plan we discussed, and feel free to reach out if I can assist you further.  Please note that Annual Wellness Visits do not include a physical exam. Some assessments may be limited, especially if the visit was conducted virtually. If needed, we may recommend an in-person follow-up with your provider.  Ongoing Care Seeing your primary care provider every 3 to 6 months helps us  monitor your health and provide consistent, personalized care.   Referrals If a referral was made during today's visit and you haven't received any updates within two weeks, please contact the referred provider directly to check on the status.  Recommended Screenings:  Health Maintenance  Topic Date Due   Zoster (Shingles) Vaccine (1 of 2) Never done   COVID-19 Vaccine (3 - Moderna risk series) 08/14/2019   Colon Cancer Screening  02/05/2022   Flu Shot  08/29/2023   DTaP/Tdap/Td vaccine (3 - Td or Tdap) 12/10/2023   Eye exam for diabetics  12/02/2023   Yearly kidney health urinalysis for diabetes  02/12/2024   Hemoglobin A1C  03/12/2024   Yearly kidney function blood test for diabetes  04/19/2024   Complete foot exam   04/29/2024   Lipid (cholesterol) test  07/08/2024   Breast Cancer Screening  08/03/2024   Medicare Annual Wellness Visit  12/10/2024   Pneumococcal Vaccine for age over 13  Completed   DEXA scan (bone density measurement)  Completed   Hepatitis C Screening  Completed   Meningitis B Vaccine  Aged Out       12/11/2023    3:15 PM  Advanced Directives  Does Patient Have a Medical Advance Directive? Yes  Type of Advance Directive Healthcare Power of Attorney  Copy of Healthcare Power of Attorney in Chart? No - copy requested    Vision: Annual vision screenings are recommended for early detection of glaucoma, cataracts, and diabetic retinopathy. These exams can also reveal signs of chronic conditions such as diabetes and high blood  pressure.  Dental: Annual dental screenings help detect early signs of oral cancer, gum disease, and other conditions linked to overall health, including heart disease and diabetes.  Please see the attached documents for additional preventive  care recommendations.

## 2023-12-11 NOTE — Assessment & Plan Note (Signed)
 Improving. Weight today is 124 pounds, increased from 121 in September. She is still using Glucerna shakes. Still using Trucility 0.75mg  weekly. If weight continues to decrease in the future and A1c continues to remain stable, then consider removing GLP1 to prevent worsening of weight loss.

## 2023-12-12 ENCOUNTER — Other Ambulatory Visit: Payer: Self-pay

## 2023-12-12 ENCOUNTER — Other Ambulatory Visit (HOSPITAL_BASED_OUTPATIENT_CLINIC_OR_DEPARTMENT_OTHER): Payer: Self-pay

## 2023-12-12 MED ORDER — TRULICITY 0.75 MG/0.5ML ~~LOC~~ SOAJ
0.7500 mg | SUBCUTANEOUS | 3 refills | Status: AC
  Filled 2023-12-12: qty 6, 84d supply, fill #0
  Filled 2024-02-12 – 2024-02-16 (×2): qty 6, 84d supply, fill #1

## 2023-12-12 MED ORDER — FREESTYLE LIBRE 3 SENSOR MISC: Status: AC

## 2023-12-13 ENCOUNTER — Other Ambulatory Visit: Payer: Self-pay

## 2023-12-15 ENCOUNTER — Other Ambulatory Visit

## 2023-12-18 ENCOUNTER — Telehealth: Payer: Self-pay

## 2023-12-18 ENCOUNTER — Other Ambulatory Visit: Payer: Self-pay

## 2023-12-18 DIAGNOSIS — I10 Essential (primary) hypertension: Secondary | ICD-10-CM

## 2023-12-18 NOTE — Progress Notes (Signed)
 LIBERATE Study  Patient completed first study visit for the LIBERATE CGM Study. Contacted patient to discuss CGM tolerability. Confirmed HIPAA identifiers.   CGM Study Study visit: 7 Day Telephone Call  CGM Data Download date: 12/18/23 % Time CGM Is Active: 48 % Average glucose (mg/dL): 836 mg/dL Glucose Management Indicator (%): 7.2 % Glucose Variability (%): 31 % Time Above Range >180 mg/dL (%): 30 % Time in Range 70-180 mg/dL (%): 70 % Time Below Range <70 mg/dL (%): 0 %        Patient confirms Libre 3 sensors is working and glucose values are transmitting appropriately. Denies any questions or concerns.   - Confirmed follow-up telephone call on 12/29/23. Second study visit due ~Feb 2026  Lorain Baseman, PharmD [facility] [phone]

## 2023-12-18 NOTE — Telephone Encounter (Signed)
 Medication discontinued

## 2023-12-22 ENCOUNTER — Other Ambulatory Visit: Payer: Self-pay

## 2023-12-22 NOTE — Progress Notes (Signed)
 Internal Medicine Attending:  I reviewed the AWV findings of the medical professional who conducted the visit. I was present in the office suite and immediately available to provide assistance and direction throughout the time the service was provided.

## 2023-12-23 ENCOUNTER — Ambulatory Visit: Admitting: Podiatry

## 2023-12-24 ENCOUNTER — Other Ambulatory Visit: Payer: Self-pay

## 2023-12-26 ENCOUNTER — Other Ambulatory Visit: Payer: Self-pay

## 2023-12-29 ENCOUNTER — Other Ambulatory Visit (HOSPITAL_COMMUNITY): Payer: Self-pay

## 2023-12-29 ENCOUNTER — Other Ambulatory Visit

## 2023-12-30 ENCOUNTER — Telehealth: Payer: Self-pay

## 2023-12-30 NOTE — Progress Notes (Signed)
 Contacted patient's son [name] Hoit to ensure they were able to successfully request refills from [facility] pharmacy. Per patient and [facility], medications were shipped out on 12/29/23. Phone connection was poor, and patient's son said he was not available to talk at the moment. Will follow-up in ~4 weeks to see if they need to transition to filling 90ds.   Lorain Baseman, PharmD [facility] [phone]

## 2024-01-06 ENCOUNTER — Encounter: Payer: Self-pay | Admitting: Podiatry

## 2024-01-06 ENCOUNTER — Ambulatory Visit: Admitting: Podiatry

## 2024-01-06 DIAGNOSIS — B351 Tinea unguium: Secondary | ICD-10-CM

## 2024-01-06 DIAGNOSIS — E1142 Type 2 diabetes mellitus with diabetic polyneuropathy: Secondary | ICD-10-CM

## 2024-01-06 NOTE — Progress Notes (Signed)
 This patient returns to my office for at risk foot care.  This patient requires this care by a professional since this patient will be at risk due to having diabetic neuropathy.This patient is unable to cut nails herself since the patient cannot reach her nails.These nails are painful walking and wearing shoes.  This patient presents for at risk foot care today.  General Appearance  Alert, conversant and in no acute stress.  Vascular  Dorsalis pedis and posterior tibial  pulses are palpable  bilaterally.  Capillary return is within normal limits  bilaterally. Temperature is within normal limits  bilaterally.  Neurologic  Senn-Weinstein monofilament wire test within normal limits  bilaterally. Muscle power within normal limits bilaterally.  Nails Thick disfigured discolored nails with subungual debris  from hallux to fifth toes bilaterally. No evidence of bacterial infection or drainage bilaterally.  Orthopedic  No limitations of motion  feet .  No crepitus or effusions noted.  No bony pathology or digital deformities noted.  Skin  normotropic skin with no porokeratosis noted bilaterally.  No signs of infections or ulcers noted.   HD 5th  B/L asymptomatic.  Onychomycosis  Pain in right toes  Pain in left toes  Consent was obtained for treatment procedures.   Mechanical debridement of nails 1-5  bilaterally performed with a nail nipper.  Filed with dremel without incident.    Return office visit   3 months                   Told patient to return for periodic foot care and evaluation due to potential at risk complications.   Cordella Bold DPM

## 2024-01-26 ENCOUNTER — Other Ambulatory Visit

## 2024-01-26 DIAGNOSIS — E1142 Type 2 diabetes mellitus with diabetic polyneuropathy: Secondary | ICD-10-CM | POA: Diagnosis not present

## 2024-01-26 DIAGNOSIS — Z794 Long term (current) use of insulin: Secondary | ICD-10-CM

## 2024-01-26 NOTE — Progress Notes (Signed)
 "  01/26/2024 Name: [name] MRN: [id number] DOB: 1950/02/09  Chief Complaint  Patient presents with   Diabetes    [name] is a 73 y.o. year old female who presented for a telephone visit.   They were referred to the pharmacist by their PCP for assistance in managing diabetes. SABRA PMH includes HTN, hypothyroidism, T2DM with polyneuropathy, HLD, cognitive impairment, depression.   Subjective: Patient was last seen by PCP, Dr. Isobel, on 12/11/23. At that time, we enrolled in her the LIBERATE study for continued access to CGM (since PA for her Dexcom sensors was not renewed due to her no longer being on insulin ).   Today, patient's son reports she is doing well. Denies issues with [place] sensors. Made patient and son aware of recall and offered to provide replacement sensors.    Care Team: Primary Care Provider: [name] Sick, MD ; Next Scheduled Visit: 03/03/24  Medication Access/Adherence  Current Pharmacy:  [facility] [address] [place] [address] Phone: [phone] Fax: [phone]   Patient reports affordability concerns with their medications: No  Patient reports access/transportation concerns to their pharmacy: Yes  - using [facility] for mail delivery Patient reports adherence concerns with their medications:  No      Diabetes:  Current medications: Synjardy  12.05-998 mg BID, pioglitazone  30 mg daily, Trulicity  0.75 mg weekly Medications tried in the past: insulin  (hypoglycemia)   Date of Download: 01/26/24    Patient denies hypoglycemic s/sx including dizziness, shakiness, sweating. Patient denies hyperglycemic symptoms including polyuria, polydipsia, polyphagia, nocturia, neuropathy, blurred vision.  Objective:  BP Readings from Last 3 Encounters:  12/11/23 (!) 150/78  12/11/23 (!) 125/59  10/13/23 (!) 142/86    Lab Results  Component Value Date   HGBA1C 8.2 12/11/2023   HGBA1C 7.9 (A) 10/13/2023   HGBA1C  8.9 (A) 07/09/2023       Latest Ref Rng & Units 04/20/2023    3:50 AM 01/15/2022   10:20 AM 07/21/2021    6:52 AM  BMP  Glucose 70 - 99 mg/dL 849  838  87   BUN 8 - 23 mg/dL 18  21  26    Creatinine 0.44 - 1.00 mg/dL 8.97  9.11  8.78   Sodium 135 - 145 mmol/L 137  138  140   Potassium 3.5 - 5.1 mmol/L 3.8  3.8  3.9   Chloride 98 - 111 mmol/L 104  104  106   CO2 22 - 32 mmol/L 22  25  25    Calcium  8.9 - 10.3 mg/dL 9.6  9.7  9.4     Lab Results  Component Value Date   CHOL 94 (L) 07/09/2023   HDL 43 07/09/2023   LDLCALC 36 07/09/2023   TRIG 68 07/09/2023   CHOLHDL 2.2 07/09/2023    Medications Reviewed Today     Reviewed by Brinda Lorain SQUIBB, RPH-CPP (Pharmacist) on 01/26/24 at 1654  Med List Status: <None>   Medication Order Taking? Sig Documenting Provider Last Dose Status Informant  busPIRone  (BUSPAR ) 5 MG tablet [id number]  Take 1 tablet (5 mg total) by mouth 2 (two) times daily. At noon and before bedtime. Lovie Clarity, MD  Active   Continuous Glucose Sensor (FREESTYLE LIBRE 3 SENSOR) [place] [id number]  Place 1 sensor on the skin every 14 days. Use to check glucose continuously Amilibia, Jaden, DO  Active   Dulaglutide  (TRULICITY ) 0.75 MG/0.5ML SOAJ [id number]  Inject 0.75 mg into the skin once a week. Amilibia, Jaden,  DO  Active   Empagliflozin -metFORMIN  HCl (SYNJARDY ) 12.05-998 MG TABS [id number]  Take 1 tablet by mouth 2 (two) times daily. At noon and before bedtime. Lovie Clarity, MD  Active   glucose 4 GM chewable tablet [id number]  Chew 4 tablets (16 g total) by mouth as needed for low blood sugar. Veronia Maria, DO  Active Self, Pharmacy Records           Med Note MYLO POWELL CROME   Sat Apr 17, 2019  8:29 PM) No record of this in past 6 months per external pharmacy records  latanoprost (XALATAN) 0.005 % ophthalmic solution [id number]  1 drop at bedtime. [provider]  Active Self, Pharmacy Records  levothyroxine  (SYNTHROID ) 50 MCG tablet [id number]  Take 1 tablet (50  mcg total) by mouth daily. Take at 8am, 30 minutes before food and other medications. Lovie Clarity, MD  Active   metoprolol  succinate (TOPROL -XL) 50 MG 24 hr tablet [id number]  Take 1 tablet (50 mg total) by mouth at bedtime. Lovie Clarity, MD  Active   Nutritional Supplements [facility] SNACK SHAKE) BERNICE [id number]  Take 1 Dose by mouth 3 (three) times daily as needed. Narendra, Nischal, MD  Active Self, Pharmacy Records           Med Note LEOBARDO, NICOLE   Sun Apr 20, 2023  6:07 AM)    olmesartan  (BENICAR ) 40 MG tablet [id number]  Take 1 tablet (40 mg total) by mouth daily at 12 noon. Amilibia, Jaden, DO  Active   pioglitazone  (ACTOS ) 30 MG tablet [id number]  Take 1 tablet (30 mg total) by mouth daily at 12 noon. Lovie Clarity, MD  Active   venlafaxine  XR (EFFEXOR -XR) 150 MG 24 hr capsule [id number]  Take 1 capsule (150 mg total) by mouth daily at 12 noon. Lovie Clarity, MD  Active               Assessment/Plan:   Type 2 Diabetes longstanding and currently controlled with TIR 79% above goal > 70%. Last A1C of 8.2% above goal < 8%. Weight has been stable on Trulicity . Will provide replacement sensors via LIBERATE study given recent product recall. - Last UACR Jan 2025 - 25 mg/g - Reviewed long term cardiovascular and renal outcomes of uncontrolled blood sugar - Reviewed goal A1c, goal fasting, and goal 2 hour post prandial glucose - Reviewed dietary modifications including  utilizing the healthy plate method, limiting portion size of carbohydrate foods, increasing intake of protein and non-starchy vegetables. Counseled patient to stay hydrated with water throughout the day. - Recommend to continue Trulicity  0.75 mg weekly, Synjardy  (empagliflozin -metformin ) 12.05-998 mg BID, pioglitazone  30 mg daily - Recommend continued use of FL3 sensor via LIBERATE study - Next A1C due 03/12/24   Medication Management: - Collaborated with pharmacy to request that 90ds of all maintenance medications are mailed  out.  Patient verbalized understanding of treatment plan.   Follow Up Plan:  Pharmacist - 3 mo LIBERATE ~ 03/15/24 PCP clinic visit in 03/03/24   Lorain Baseman, PharmD [facility] [phone]   "

## 2024-01-27 ENCOUNTER — Other Ambulatory Visit: Payer: Self-pay

## 2024-01-27 ENCOUNTER — Other Ambulatory Visit (HOSPITAL_COMMUNITY): Payer: Self-pay

## 2024-02-12 ENCOUNTER — Other Ambulatory Visit: Payer: Self-pay

## 2024-02-12 ENCOUNTER — Other Ambulatory Visit (HOSPITAL_COMMUNITY): Payer: Self-pay

## 2024-02-12 ENCOUNTER — Telehealth: Payer: Self-pay | Admitting: *Deleted

## 2024-02-12 NOTE — Progress Notes (Signed)
 Patient's son, [name] Hoit, left me a voicemail, but appears he spoke with appropriate team about refilling medications today. I sent a message to [facility] pharmacy to ensure medications are mailed out for 90ds if possible. All medications have refills on file.   Lorain Baseman, PharmD [facility] [phone]

## 2024-02-12 NOTE — Telephone Encounter (Signed)
 Call to Pharmacy originally was told that patient did not need any refills.  Csl to patient's son.  Patient is out of medications.  Call to Pharmacy spoke to Reyno.  Patient is indeed in need of refills.  Son stated that he was awaiting a call form Layna.  Call to Pharmacy spoke to [place].  Monica upon rechecking states that patient is due refills on meds.  Monica to call patient's son to discuss what medications are in need of refills and to ask if patient wants to continue with the packaging.  Odella was given phone number to call patient's son.                                  Copied from CRM #[id number]. Topic: Clinical - Medication Question >> Feb 12, 2024  8:34 AM Alfonso ORN wrote: Reason for CRM: Patient son [name] Hoit calling to see if patient 's medications are sent to pharmacy , patient needing all her medication , son will call back with the names of the medications that patient need   ----------------------------------------------------------------------- From previous Reason for Contact - Medication Refill: Medication:   Has the patient contacted their pharmacy?   (Agent: If no, request that the patient contact the pharmacy for the refill. If patient does not wish to contact the pharmacy document the reason why and proceed with request.) (Agent: If yes, when and what did the pharmacy advise?)  This is the patient's preferred pharmacy:  [place] [facility] [address] [place] [address] Phone: [phone] Fax: [phone]  Is this the correct pharmacy for this prescription?   If no, delete pharmacy and type the correct one.   Has the prescription been filled recently?    Is the patient out of the medication?    Has the patient been seen for an appointment in the last year OR does the patient have an upcoming appointment?    Can we respond through MyChart?    Agent: Please be advised that Rx refills may take up to 3 business days. We ask  that you follow-up with your pharmacy.

## 2024-02-13 ENCOUNTER — Other Ambulatory Visit: Payer: Self-pay

## 2024-02-13 ENCOUNTER — Other Ambulatory Visit (HOSPITAL_COMMUNITY): Payer: Self-pay

## 2024-02-13 DIAGNOSIS — E039 Hypothyroidism, unspecified: Secondary | ICD-10-CM

## 2024-02-13 MED ORDER — LEVOTHYROXINE SODIUM 50 MCG PO TABS
50.0000 ug | ORAL_TABLET | Freq: Every day | ORAL | 3 refills | Status: AC
  Filled 2024-02-13: qty 90, 90d supply, fill #0

## 2024-02-13 NOTE — Progress Notes (Signed)
 Pharmacy needing approval to change manufacturer for NTI drug levothyroxine . Resent Rx with approval.   Nasiya Pascual, PharmD [facility] [phone]

## 2024-02-16 ENCOUNTER — Other Ambulatory Visit: Payer: Self-pay

## 2024-02-16 ENCOUNTER — Encounter (HOSPITAL_COMMUNITY): Payer: Self-pay | Admitting: Pharmacy Technician

## 2024-02-16 ENCOUNTER — Other Ambulatory Visit (HOSPITAL_COMMUNITY): Payer: Self-pay

## 2024-02-16 ENCOUNTER — Telehealth: Payer: Self-pay | Admitting: Pharmacy Technician

## 2024-02-16 NOTE — Patient Outreach (Signed)
 Erroneous Encounter.  [name], CPhT [facility] Office: [phone] Email: [name]

## 2024-02-16 NOTE — Progress Notes (Signed)
" ° °  02/16/2024 Name: [name] MRN: [id number] DOB: 1950-07-30  Patient is appearing for a follow-up visit with the population health pharmacy technician. Last engaged with the clinical pharmacist to discuss diabetes on 01/26/2024. Contacted patient and pharmacy today to discuss medication access.   Plan from last clinical pharmacist appointment:  Type 2 Diabetes longstanding and currently controlled with TIR 79% above goal > 70%. Last A1C of 8.2% above goal < 8%. Weight has been stable on Trulicity . Will provide replacement sensors via LIBERATE study given recent product recall. - Last UACR Jan 2025 - 25 mg/g - Reviewed long term cardiovascular and renal outcomes of uncontrolled blood sugar - Reviewed goal A1c, goal fasting, and goal 2 hour post prandial glucose - Reviewed dietary modifications including  utilizing the healthy plate method, limiting portion size of carbohydrate foods, increasing intake of protein and non-starchy vegetables. Counseled patient to stay hydrated with water throughout the day. - Recommend to continue Trulicity  0.75 mg weekly, Synjardy  (empagliflozin -metformin ) 12.05-998 mg BID, pioglitazone  30 mg daily - Recommend continued use of FL3 sensor via LIBERATE study - Next A1C due 03/12/24 Medication Management: - Collaborated with pharmacy to request that 90ds of all maintenance medications are mailed out. Patient verbalized understanding of treatment plan.  Follow Up Plan:  Pharmacist - 3 mo LIBERATE ~ 03/15/24 PCP clinic visit in 03/03/24(copy/paste from last note)   Medication Adherence Barriers Identified:  Access issues with any new medication or testing device: Yes Maintenance medications  Medication Adherence Barriers Addressed/Actions Taken:  Reviewed medication changes per plan from last clinical pharmacist note Medication Access for maintenance medications Will discuss medication access concerns with pharmacist  Outreach by [facility]  technician was requested.  Outreached patient's son to discuss the need to provide payment information to the pharmacy to have his mother's medications delivery on the advice of the pharmacist in the clinic. Left voicemail for patient to return my call at their convenience.  Contacted pharmacy regarding refils on maintenance medications Spoke to [place] to inquire if medications could be mailed out via A/R account since patient is a dually enrolled Medicare/Medicaid patient. Dena informs patient called in to the pharmacy today and provided payment information. Dena informs medications will go out on Tuesday with delivery on Wednesday. Bella also informs the A/R account could be utiilized in the future if needed.  Next clinical pharmacist appointment is scheduled for: 03/15/2024  Siara Gorder, CPhT [facility] Office: [phone] Email: [email or web address]   "

## 2024-02-17 ENCOUNTER — Other Ambulatory Visit: Payer: Self-pay

## 2024-02-18 ENCOUNTER — Telehealth: Payer: Self-pay | Admitting: Pharmacy Technician

## 2024-02-18 NOTE — Progress Notes (Signed)
" ° °  02/18/2024 Name: [name] MRN: [id number] DOB: 1950-10-03  Patient is appearing for a follow-up visit with the population health pharmacy technician. Last engaged with the clinical pharmacist to discuss diabetes on 01/26/2024. Contacted patient today to discuss medication adherence and medication access.   Plan from last clinical pharmacist appointment:  Type 2 Diabetes longstanding and currently controlled with TIR 79% above goal > 70%. Last A1C of 8.2% above goal < 8%. Weight has been stable on Trulicity . Will provide replacement sensors via LIBERATE study given recent product recall. - Last UACR Jan 2025 - 25 mg/g - Reviewed long term cardiovascular and renal outcomes of uncontrolled blood sugar - Reviewed goal A1c, goal fasting, and goal 2 hour post prandial glucose - Reviewed dietary modifications including  utilizing the healthy plate method, limiting portion size of carbohydrate foods, increasing intake of protein and non-starchy vegetables. Counseled patient to stay hydrated with water throughout the day. - Recommend to continue Trulicity  0.75 mg weekly, Synjardy  (empagliflozin -metformin ) 12.05-998 mg BID, pioglitazone  30 mg daily - Recommend continued use of FL3 sensor via LIBERATE study - Next A1C due 03/12/24 Medication Management: - Collaborated with pharmacy to request that 90ds of all maintenance medications are mailed out. Patient verbalized understanding of treatment plan.  Follow Up Plan:  Pharmacist - 3 mo LIBERATE ~ 03/15/24 PCP clinic visit in 03/03/24(copy/paste from last note)(copy/paste from last note)   Medication Adherence Barriers Identified:  Access issues with any new medication or testing device: Yes Maintenance medications   Medication Adherence Barriers Addressed/Actions Taken:  Medication Access for Maintenance medications Per Pharmacy, Buspirone , Trulicity , Synjardy , Levothyroxine , Metoprolol  XL, Olmesartan , Pioglitazone  and Venlafaxine  XR all show a  ship date of either 1/19 or 1/20. Spoke to patient's son [name]. HIPAA verified. [name] informs patient received her medications yesterday. He appreciates the help and follow up.  Next clinical pharmacist appointment is scheduled for: 03/15/2024   Porschia Willbanks, CPhT [facility] Office: [phone] Email: [email or web address]  "

## 2024-02-23 ENCOUNTER — Telehealth: Payer: Self-pay

## 2024-02-23 NOTE — Telephone Encounter (Signed)
 Patient's son [name] dama me via telephone with concerns that his LibreLinkUp account was no longer connected to his mother's account. New sensor had recently been applied, and then connection was cut off.   We attempted to trouble shoot the issue.  [name] deleted and re-downloaded the app [name] resent Librelinkup link from [name] app to his email and attempted to reconnect [name] reset his phone.   The above interventions did not return LibreLinkUp access. Reassured [name] that [name] is at low risk for hypoglycemic event. AGP report demonstrates stable BG control. Discussed that CDCES [name] may be available to troubleshoot further at upcoming PCP on 03/03/24 (since I will not be in clinic that day). Possible that access will return when new sensor is applied.     [name], PharmD [facility] [phone]

## 2024-02-24 NOTE — Telephone Encounter (Signed)
 I should be here and will try to help him.

## 2024-03-03 ENCOUNTER — Ambulatory Visit: Payer: Self-pay | Admitting: Dietician

## 2024-03-03 ENCOUNTER — Ambulatory Visit

## 2024-03-03 NOTE — Telephone Encounter (Signed)
 Hi Layna,  They rescheduled today's visit. You will see them before they return on the 26th.  Arland

## 2024-03-25 ENCOUNTER — Ambulatory Visit: Payer: Self-pay

## 2024-03-25 ENCOUNTER — Ambulatory Visit: Payer: Self-pay | Admitting: Dietician

## 2024-04-05 ENCOUNTER — Ambulatory Visit: Admitting: Podiatry
